# Patient Record
Sex: Female | Born: 1960
Health system: Southern US, Community
[De-identification: ages and names within clinical notes are randomized; demographics above are authoritative.]

## PROBLEM LIST (undated history)

## (undated) DIAGNOSIS — F419 Anxiety disorder, unspecified: Secondary | ICD-10-CM

## (undated) DIAGNOSIS — F329 Major depressive disorder, single episode, unspecified: Secondary | ICD-10-CM

## (undated) DIAGNOSIS — F32A Depression, unspecified: Secondary | ICD-10-CM

## (undated) DIAGNOSIS — E079 Disorder of thyroid, unspecified: Secondary | ICD-10-CM

## (undated) DIAGNOSIS — E7800 Pure hypercholesterolemia, unspecified: Secondary | ICD-10-CM

## (undated) DIAGNOSIS — R0902 Hypoxemia: Secondary | ICD-10-CM

## (undated) DIAGNOSIS — B9681 Helicobacter pylori [H. pylori] as the cause of diseases classified elsewhere: Secondary | ICD-10-CM

## (undated) DIAGNOSIS — I499 Cardiac arrhythmia, unspecified: Secondary | ICD-10-CM

## (undated) DIAGNOSIS — M810 Age-related osteoporosis without current pathological fracture: Secondary | ICD-10-CM

## (undated) DIAGNOSIS — E1140 Type 2 diabetes mellitus with diabetic neuropathy, unspecified: Secondary | ICD-10-CM

## (undated) DIAGNOSIS — G709 Myoneural disorder, unspecified: Secondary | ICD-10-CM

## (undated) DIAGNOSIS — I10 Essential (primary) hypertension: Secondary | ICD-10-CM

## (undated) DIAGNOSIS — J45909 Unspecified asthma, uncomplicated: Secondary | ICD-10-CM

## (undated) DIAGNOSIS — T7840XA Allergy, unspecified, initial encounter: Secondary | ICD-10-CM

## (undated) DIAGNOSIS — L739 Follicular disorder, unspecified: Secondary | ICD-10-CM

## (undated) DIAGNOSIS — G43909 Migraine, unspecified, not intractable, without status migrainosus: Secondary | ICD-10-CM

## (undated) DIAGNOSIS — J439 Emphysema, unspecified: Secondary | ICD-10-CM

## (undated) DIAGNOSIS — E119 Type 2 diabetes mellitus without complications: Secondary | ICD-10-CM

## (undated) DIAGNOSIS — IMO0002 Reserved for concepts with insufficient information to code with codable children: Secondary | ICD-10-CM

## (undated) DIAGNOSIS — Z8719 Personal history of other diseases of the digestive system: Secondary | ICD-10-CM

## (undated) DIAGNOSIS — N200 Calculus of kidney: Secondary | ICD-10-CM

## (undated) DIAGNOSIS — I2510 Atherosclerotic heart disease of native coronary artery without angina pectoris: Secondary | ICD-10-CM

## (undated) DIAGNOSIS — N63 Unspecified lump in unspecified breast: Secondary | ICD-10-CM

## (undated) DIAGNOSIS — I219 Acute myocardial infarction, unspecified: Secondary | ICD-10-CM

## (undated) DIAGNOSIS — M1990 Unspecified osteoarthritis, unspecified site: Secondary | ICD-10-CM

## (undated) DIAGNOSIS — K219 Gastro-esophageal reflux disease without esophagitis: Secondary | ICD-10-CM

## (undated) DIAGNOSIS — M545 Low back pain, unspecified: Secondary | ICD-10-CM

## (undated) DIAGNOSIS — E0590 Thyrotoxicosis, unspecified without thyrotoxic crisis or storm: Secondary | ICD-10-CM

## (undated) DIAGNOSIS — K2970 Gastritis, unspecified, without bleeding: Secondary | ICD-10-CM

## (undated) DIAGNOSIS — I509 Heart failure, unspecified: Secondary | ICD-10-CM

## (undated) DIAGNOSIS — R040 Epistaxis: Secondary | ICD-10-CM

## (undated) DIAGNOSIS — G4730 Sleep apnea, unspecified: Secondary | ICD-10-CM

## (undated) HISTORY — DX: Anxiety disorder, unspecified: F41.9

## (undated) HISTORY — DX: Migraine, unspecified, not intractable, without status migrainosus: G43.909

## (undated) HISTORY — DX: Essential (primary) hypertension: I10

## (undated) HISTORY — DX: Emphysema, unspecified: J43.9

## (undated) HISTORY — DX: Disorder of thyroid, unspecified: E07.9

## (undated) HISTORY — DX: Heart failure, unspecified: I50.9

## (undated) HISTORY — DX: Type 2 diabetes mellitus without complications: E11.9

## (undated) HISTORY — PX: UPPER GASTROINTESTINAL ENDOSCOPY: SHX188

## (undated) HISTORY — DX: Low back pain, unspecified: M54.50

## (undated) HISTORY — DX: Unspecified asthma, uncomplicated: J45.909

## (undated) HISTORY — PX: SPINE SURGERY: SHX786

## (undated) HISTORY — DX: Depression, unspecified: F32.A

## (undated) HISTORY — DX: Atherosclerotic heart disease of native coronary artery without angina pectoris: I25.10

## (undated) HISTORY — DX: Unspecified lump in unspecified breast: N63.0

## (undated) HISTORY — DX: Allergy, unspecified, initial encounter: T78.40XA

## (undated) HISTORY — PX: ABDOMINAL HYSTERECTOMY: SHX81

## (undated) HISTORY — PX: CARPAL TUNNEL RELEASE: SHX101

## (undated) HISTORY — DX: Hypoxemia: R09.02

## (undated) HISTORY — DX: Age-related osteoporosis without current pathological fracture: M81.0

## (undated) HISTORY — DX: Morbid (severe) obesity due to excess calories: E66.01

## (undated) HISTORY — DX: Follicular disorder, unspecified: L73.9

## (undated) HISTORY — DX: Gastritis, unspecified, without bleeding: K29.70

## (undated) HISTORY — DX: Reserved for concepts with insufficient information to code with codable children: IMO0002

## (undated) HISTORY — DX: Helicobacter pylori (H. pylori) as the cause of diseases classified elsewhere: B96.81

## (undated) HISTORY — PX: COLONOSCOPY: SHX174

## (undated) HISTORY — DX: Calculus of kidney: N20.0

## (undated) HISTORY — DX: Thyrotoxicosis, unspecified without thyrotoxic crisis or storm: E05.90

## (undated) HISTORY — DX: Type 2 diabetes mellitus with diabetic neuropathy, unspecified: E11.40

## (undated) HISTORY — DX: Major depressive disorder, single episode, unspecified: F32.9

## (undated) HISTORY — DX: Low back pain: M54.5

## (undated) HISTORY — PX: HERNIA REPAIR: SHX51

## (undated) NOTE — *Deleted (*Deleted)
Advice for Weight Management  -For most of us the best way to lose weight is by diet management. Generally speaking, diet management means consuming less calories intentionally which over time brings about progressive weight loss.  This can be achieved more effectively by restricting carbohydrate consumption to the minimum possible.  So, it is critically important to know your numbers: how much calorie you are consuming and how much calorie you need. More importantly, our carbohydrates sources should be unprocessed or minimally processed complex starch food items.   Sometimes, it is important to balance nutrition by increasing protein intake (animal or plant source), fruits, and vegetables.  -Sticking to a routine mealtime to eat 3 meals a day and avoiding unnecessary snacks is shown to have a big role in weight control. Under normal circumstances, the only time we lose real weight is when we are hungry, so allow hunger to take place- hunger means no food between meal times, only water.  It is not advisable to starve.   -It is better to avoid simple carbohydrates including: Cakes, Sweet Desserts, Ice Cream, Soda (diet and regular), Sweet Tea, Candies, Chips, Cookies, Store Bought Juices, Alcohol in Excess of  1-2 drinks a day, Artificial Sweeteners, Doughnuts, Coffee Creamers, "Sugar-free" Products, etc, etc.  This is not a complete list.....    -Consulting with certified diabetes educators is proven to provide you with the most accurate and current information on diet.  Also, you may be  interested in discussing diet options/exchanges , we can schedule a visit with Angela [name], RDN, CDE for individualized nutrition education.  -Exercise: If you are able: 30 -60 minutes a day ,4 days a week, or 150 minutes a week.  The longer the better.  Combine stretch, strength, and aerobic activities.  If you were told in the past that you have high risk for cardiovascular diseases, you may seek evaluation by  your heart doctor prior to initiating moderate to intense exercise programs.    

---
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
## 1989-05-01 HISTORY — PX: OTHER SURGICAL HISTORY: SHX169

## 1989-05-01 HISTORY — PX: PARTIAL HYSTERECTOMY: SHX80

## 1991-05-02 HISTORY — PX: BACK SURGERY: SHX140

## 1992-05-01 HISTORY — PX: BACK SURGERY: SHX140

## 1994-05-01 HISTORY — PX: CHOLECYSTECTOMY: SHX55

## 1997-05-01 HISTORY — PX: FRACTURE SURGERY: SHX138

## 1997-05-01 HISTORY — PX: OTHER SURGICAL HISTORY: SHX169

## 1998-03-10 ENCOUNTER — Emergency Department (HOSPITAL_COMMUNITY): Admission: EM | Admit: 1998-03-10 | Discharge: 1998-03-10 | Payer: Self-pay | Admitting: Emergency Medicine

## 1998-03-21 ENCOUNTER — Ambulatory Visit (HOSPITAL_COMMUNITY): Admission: RE | Admit: 1998-03-21 | Discharge: 1998-03-21 | Payer: Self-pay | Admitting: Neurosurgery

## 1998-03-21 ENCOUNTER — Encounter: Payer: Self-pay | Admitting: Neurosurgery

## 1998-03-23 ENCOUNTER — Inpatient Hospital Stay (HOSPITAL_COMMUNITY): Admission: RE | Admit: 1998-03-23 | Discharge: 1998-03-25 | Payer: Self-pay | Admitting: Neurosurgery

## 1998-03-23 ENCOUNTER — Encounter: Payer: Self-pay | Admitting: Neurosurgery

## 1998-08-16 ENCOUNTER — Encounter: Payer: Self-pay | Admitting: Emergency Medicine

## 1998-08-16 ENCOUNTER — Emergency Department (HOSPITAL_COMMUNITY): Admission: EM | Admit: 1998-08-16 | Discharge: 1998-08-16 | Payer: Self-pay | Admitting: Emergency Medicine

## 1999-03-03 ENCOUNTER — Ambulatory Visit (HOSPITAL_BASED_OUTPATIENT_CLINIC_OR_DEPARTMENT_OTHER): Admission: RE | Admit: 1999-03-03 | Discharge: 1999-03-03 | Payer: Self-pay | Admitting: Orthopedic Surgery

## 1999-03-21 ENCOUNTER — Encounter: Admission: RE | Admit: 1999-03-21 | Discharge: 1999-06-19 | Payer: Self-pay | Admitting: Orthopedic Surgery

## 1999-08-09 ENCOUNTER — Encounter: Payer: Self-pay | Admitting: Emergency Medicine

## 1999-08-09 ENCOUNTER — Emergency Department (HOSPITAL_COMMUNITY): Admission: EM | Admit: 1999-08-09 | Discharge: 1999-08-09 | Payer: Self-pay | Admitting: Emergency Medicine

## 1999-09-01 ENCOUNTER — Ambulatory Visit (HOSPITAL_COMMUNITY): Admission: RE | Admit: 1999-09-01 | Discharge: 1999-09-01 | Payer: Self-pay | Admitting: Urology

## 2000-02-19 ENCOUNTER — Ambulatory Visit (HOSPITAL_COMMUNITY): Admission: RE | Admit: 2000-02-19 | Discharge: 2000-02-19 | Payer: Self-pay | Admitting: Neurosurgery

## 2000-02-19 ENCOUNTER — Encounter: Payer: Self-pay | Admitting: Neurosurgery

## 2000-05-01 HISTORY — PX: BACK SURGERY: SHX140

## 2000-10-17 ENCOUNTER — Other Ambulatory Visit: Admission: RE | Admit: 2000-10-17 | Discharge: 2000-10-17 | Payer: Self-pay | Admitting: Obstetrics and Gynecology

## 2001-07-11 ENCOUNTER — Ambulatory Visit (HOSPITAL_COMMUNITY): Admission: RE | Admit: 2001-07-11 | Discharge: 2001-07-11 | Payer: Self-pay | Admitting: Internal Medicine

## 2001-07-25 ENCOUNTER — Encounter: Payer: Self-pay | Admitting: Family Medicine

## 2001-07-25 ENCOUNTER — Ambulatory Visit (HOSPITAL_COMMUNITY): Admission: RE | Admit: 2001-07-25 | Discharge: 2001-07-25 | Payer: Self-pay | Admitting: Family Medicine

## 2001-08-07 ENCOUNTER — Other Ambulatory Visit: Admission: RE | Admit: 2001-08-07 | Discharge: 2001-08-07 | Payer: Self-pay | Admitting: Obstetrics and Gynecology

## 2001-08-08 ENCOUNTER — Ambulatory Visit (HOSPITAL_COMMUNITY): Admission: RE | Admit: 2001-08-08 | Discharge: 2001-08-08 | Payer: Self-pay | Admitting: Obstetrics and Gynecology

## 2001-08-08 ENCOUNTER — Encounter: Payer: Self-pay | Admitting: Obstetrics and Gynecology

## 2001-08-27 ENCOUNTER — Ambulatory Visit (HOSPITAL_COMMUNITY): Admission: RE | Admit: 2001-08-27 | Discharge: 2001-08-27 | Payer: Self-pay | Admitting: Internal Medicine

## 2001-09-06 ENCOUNTER — Emergency Department (HOSPITAL_COMMUNITY): Admission: EM | Admit: 2001-09-06 | Discharge: 2001-09-06 | Payer: Self-pay | Admitting: *Deleted

## 2001-11-01 ENCOUNTER — Emergency Department (HOSPITAL_COMMUNITY): Admission: EM | Admit: 2001-11-01 | Discharge: 2001-11-01 | Payer: Self-pay | Admitting: Internal Medicine

## 2001-11-01 ENCOUNTER — Encounter: Payer: Self-pay | Admitting: *Deleted

## 2002-03-12 ENCOUNTER — Ambulatory Visit (HOSPITAL_COMMUNITY): Admission: RE | Admit: 2002-03-12 | Discharge: 2002-03-12 | Payer: Self-pay | Admitting: General Surgery

## 2002-05-23 ENCOUNTER — Encounter: Admission: RE | Admit: 2002-05-23 | Discharge: 2002-05-23 | Payer: Self-pay | Admitting: Urology

## 2002-05-23 ENCOUNTER — Encounter: Payer: Self-pay | Admitting: Urology

## 2002-06-25 ENCOUNTER — Ambulatory Visit (HOSPITAL_COMMUNITY): Admission: RE | Admit: 2002-06-25 | Discharge: 2002-06-25 | Payer: Self-pay | Admitting: Family Medicine

## 2002-06-25 ENCOUNTER — Encounter: Payer: Self-pay | Admitting: Family Medicine

## 2003-03-25 ENCOUNTER — Ambulatory Visit (HOSPITAL_COMMUNITY): Admission: RE | Admit: 2003-03-25 | Discharge: 2003-03-25 | Payer: Self-pay | Admitting: Family Medicine

## 2003-04-06 ENCOUNTER — Emergency Department (HOSPITAL_COMMUNITY): Admission: EM | Admit: 2003-04-06 | Discharge: 2003-04-06 | Payer: Self-pay | Admitting: Emergency Medicine

## 2003-11-30 ENCOUNTER — Encounter (HOSPITAL_COMMUNITY): Admission: RE | Admit: 2003-11-30 | Discharge: 2003-12-30 | Payer: Self-pay | Admitting: Family Medicine

## 2004-03-14 ENCOUNTER — Ambulatory Visit: Payer: Self-pay | Admitting: Family Medicine

## 2004-05-09 ENCOUNTER — Ambulatory Visit: Payer: Self-pay | Admitting: Family Medicine

## 2004-06-02 ENCOUNTER — Ambulatory Visit (HOSPITAL_COMMUNITY): Admission: RE | Admit: 2004-06-02 | Discharge: 2004-06-02 | Payer: Self-pay | Admitting: General Surgery

## 2004-07-07 ENCOUNTER — Ambulatory Visit: Payer: Self-pay | Admitting: Family Medicine

## 2004-08-18 ENCOUNTER — Ambulatory Visit (HOSPITAL_COMMUNITY): Admission: RE | Admit: 2004-08-18 | Discharge: 2004-08-18 | Payer: Self-pay | Admitting: Urology

## 2004-08-31 ENCOUNTER — Ambulatory Visit (HOSPITAL_COMMUNITY): Admission: RE | Admit: 2004-08-31 | Discharge: 2004-08-31 | Payer: Self-pay | Admitting: Urology

## 2004-09-27 ENCOUNTER — Ambulatory Visit: Payer: Self-pay | Admitting: Family Medicine

## 2004-10-03 ENCOUNTER — Ambulatory Visit (HOSPITAL_COMMUNITY): Admission: RE | Admit: 2004-10-03 | Discharge: 2004-10-03 | Payer: Self-pay | Admitting: *Deleted

## 2004-10-05 ENCOUNTER — Ambulatory Visit: Payer: Self-pay | Admitting: Orthopedic Surgery

## 2004-10-10 ENCOUNTER — Ambulatory Visit (HOSPITAL_COMMUNITY): Admission: RE | Admit: 2004-10-10 | Discharge: 2004-10-10 | Payer: Self-pay | Admitting: Orthopedic Surgery

## 2005-01-19 ENCOUNTER — Ambulatory Visit: Payer: Self-pay | Admitting: Family Medicine

## 2005-02-03 ENCOUNTER — Ambulatory Visit: Payer: Self-pay | Admitting: Family Medicine

## 2005-02-07 ENCOUNTER — Ambulatory Visit: Payer: Self-pay | Admitting: Family Medicine

## 2005-03-21 ENCOUNTER — Ambulatory Visit: Payer: Self-pay | Admitting: Family Medicine

## 2005-04-20 ENCOUNTER — Ambulatory Visit: Payer: Self-pay | Admitting: Family Medicine

## 2005-04-26 ENCOUNTER — Ambulatory Visit (HOSPITAL_COMMUNITY): Admission: RE | Admit: 2005-04-26 | Discharge: 2005-04-26 | Payer: Self-pay | Admitting: Family Medicine

## 2005-05-17 ENCOUNTER — Ambulatory Visit: Payer: Self-pay | Admitting: Family Medicine

## 2005-05-25 ENCOUNTER — Encounter (HOSPITAL_COMMUNITY): Admission: RE | Admit: 2005-05-25 | Discharge: 2005-06-24 | Payer: Self-pay | Admitting: Family Medicine

## 2005-08-15 ENCOUNTER — Ambulatory Visit: Payer: Self-pay | Admitting: Family Medicine

## 2005-09-26 ENCOUNTER — Ambulatory Visit: Payer: Self-pay | Admitting: Family Medicine

## 2005-11-20 ENCOUNTER — Ambulatory Visit: Payer: Self-pay | Admitting: Family Medicine

## 2005-12-11 ENCOUNTER — Ambulatory Visit: Payer: Self-pay | Admitting: Family Medicine

## 2005-12-13 ENCOUNTER — Ambulatory Visit (HOSPITAL_COMMUNITY): Admission: RE | Admit: 2005-12-13 | Discharge: 2005-12-13 | Payer: Self-pay | Admitting: Family Medicine

## 2005-12-26 ENCOUNTER — Ambulatory Visit: Payer: Self-pay | Admitting: Family Medicine

## 2006-02-15 ENCOUNTER — Ambulatory Visit: Payer: Self-pay | Admitting: Family Medicine

## 2006-02-19 ENCOUNTER — Ambulatory Visit: Payer: Self-pay | Admitting: Family Medicine

## 2006-04-05 ENCOUNTER — Ambulatory Visit: Payer: Self-pay | Admitting: Orthopedic Surgery

## 2006-04-17 ENCOUNTER — Ambulatory Visit: Payer: Self-pay | Admitting: Family Medicine

## 2006-04-21 ENCOUNTER — Emergency Department (HOSPITAL_COMMUNITY): Admission: EM | Admit: 2006-04-21 | Discharge: 2006-04-21 | Payer: Self-pay | Admitting: Emergency Medicine

## 2006-04-25 ENCOUNTER — Emergency Department (HOSPITAL_COMMUNITY): Admission: EM | Admit: 2006-04-25 | Discharge: 2006-04-25 | Payer: Self-pay | Admitting: Emergency Medicine

## 2006-04-26 ENCOUNTER — Ambulatory Visit: Payer: Self-pay | Admitting: Internal Medicine

## 2006-04-26 ENCOUNTER — Inpatient Hospital Stay (HOSPITAL_COMMUNITY): Admission: AD | Admit: 2006-04-26 | Discharge: 2006-04-29 | Payer: Self-pay

## 2006-05-01 DIAGNOSIS — B9681 Helicobacter pylori [H. pylori] as the cause of diseases classified elsewhere: Secondary | ICD-10-CM

## 2006-05-01 HISTORY — DX: Helicobacter pylori (H. pylori) as the cause of diseases classified elsewhere: B96.81

## 2006-05-01 HISTORY — PX: ESOPHAGOGASTRODUODENOSCOPY: SHX1529

## 2006-05-02 ENCOUNTER — Ambulatory Visit: Payer: Self-pay | Admitting: Family Medicine

## 2006-05-05 ENCOUNTER — Encounter: Payer: Self-pay | Admitting: Internal Medicine

## 2006-05-05 DIAGNOSIS — E1159 Type 2 diabetes mellitus with other circulatory complications: Secondary | ICD-10-CM

## 2006-05-05 DIAGNOSIS — F32A Depression, unspecified: Secondary | ICD-10-CM | POA: Insufficient documentation

## 2006-05-05 DIAGNOSIS — E119 Type 2 diabetes mellitus without complications: Secondary | ICD-10-CM | POA: Insufficient documentation

## 2006-05-05 DIAGNOSIS — M545 Low back pain: Secondary | ICD-10-CM

## 2006-05-05 DIAGNOSIS — I10 Essential (primary) hypertension: Secondary | ICD-10-CM

## 2006-05-05 DIAGNOSIS — F418 Other specified anxiety disorders: Secondary | ICD-10-CM

## 2006-05-05 DIAGNOSIS — J45909 Unspecified asthma, uncomplicated: Secondary | ICD-10-CM

## 2006-05-05 DIAGNOSIS — F419 Anxiety disorder, unspecified: Secondary | ICD-10-CM | POA: Insufficient documentation

## 2006-05-05 DIAGNOSIS — F411 Generalized anxiety disorder: Secondary | ICD-10-CM | POA: Insufficient documentation

## 2006-05-25 ENCOUNTER — Ambulatory Visit (HOSPITAL_COMMUNITY): Admission: RE | Admit: 2006-05-25 | Discharge: 2006-05-25 | Payer: Self-pay | Admitting: Family Medicine

## 2006-05-30 ENCOUNTER — Ambulatory Visit: Payer: Self-pay | Admitting: Family Medicine

## 2006-06-15 ENCOUNTER — Encounter: Payer: Self-pay | Admitting: Family Medicine

## 2006-06-15 LAB — CONVERTED CEMR LAB
ALT: 14 units/L (ref 0–35)
Alkaline Phosphatase: 61 units/L (ref 39–117)
BUN: 12 mg/dL (ref 6–23)
Basophils Relative: 0 % (ref 0–1)
Cholesterol: 166 mg/dL (ref 0–200)
Creatinine, Ser: 0.62 mg/dL (ref 0.40–1.20)
Eosinophils Absolute: 0.1 10*3/uL (ref 0.0–0.7)
Hemoglobin: 12.3 g/dL (ref 12.0–15.0)
Indirect Bilirubin: 0.3 mg/dL (ref 0.0–0.9)
LDL Cholesterol: 102 mg/dL — ABNORMAL HIGH (ref 0–99)
MCHC: 30.8 g/dL (ref 30.0–36.0)
MCV: 86 fL (ref 78.0–100.0)
Monocytes Absolute: 0.3 10*3/uL (ref 0.2–0.7)
Monocytes Relative: 6 % (ref 3–11)
RBC: 4.64 M/uL (ref 3.87–5.11)
Total Protein: 7.4 g/dL (ref 6.0–8.3)
Triglycerides: 59 mg/dL (ref <150)

## 2006-06-18 ENCOUNTER — Other Ambulatory Visit: Admission: RE | Admit: 2006-06-18 | Discharge: 2006-06-18 | Payer: Self-pay | Admitting: Family Medicine

## 2006-06-18 ENCOUNTER — Ambulatory Visit: Payer: Self-pay | Admitting: Family Medicine

## 2006-06-18 ENCOUNTER — Encounter (INDEPENDENT_AMBULATORY_CARE_PROVIDER_SITE_OTHER): Payer: Self-pay | Admitting: *Deleted

## 2006-06-18 ENCOUNTER — Encounter: Payer: Self-pay | Admitting: Family Medicine

## 2006-06-18 LAB — CONVERTED CEMR LAB: Pap Smear: NORMAL

## 2006-06-20 ENCOUNTER — Encounter: Payer: Self-pay | Admitting: Family Medicine

## 2006-06-20 ENCOUNTER — Ambulatory Visit (HOSPITAL_COMMUNITY): Admission: RE | Admit: 2006-06-20 | Discharge: 2006-06-20 | Payer: Self-pay | Admitting: Podiatry

## 2006-06-20 LAB — CONVERTED CEMR LAB
Chlamydia, DNA Probe: NEGATIVE
Gardnerella vaginalis: NEGATIVE
Trichomonal Vaginitis: POSITIVE — AB

## 2006-07-27 ENCOUNTER — Ambulatory Visit: Payer: Self-pay | Admitting: Family Medicine

## 2006-09-26 ENCOUNTER — Ambulatory Visit: Payer: Self-pay | Admitting: Family Medicine

## 2006-10-08 ENCOUNTER — Ambulatory Visit: Payer: Self-pay | Admitting: Family Medicine

## 2006-10-08 ENCOUNTER — Emergency Department (HOSPITAL_COMMUNITY): Admission: EM | Admit: 2006-10-08 | Discharge: 2006-10-08 | Payer: Self-pay | Admitting: Emergency Medicine

## 2006-10-10 ENCOUNTER — Encounter: Payer: Self-pay | Admitting: Family Medicine

## 2006-10-15 ENCOUNTER — Ambulatory Visit (HOSPITAL_COMMUNITY): Admission: RE | Admit: 2006-10-15 | Discharge: 2006-10-15 | Payer: Self-pay | Admitting: Cardiology

## 2006-11-28 ENCOUNTER — Ambulatory Visit: Payer: Self-pay | Admitting: Family Medicine

## 2006-12-20 ENCOUNTER — Inpatient Hospital Stay (HOSPITAL_BASED_OUTPATIENT_CLINIC_OR_DEPARTMENT_OTHER): Admission: RE | Admit: 2006-12-20 | Discharge: 2006-12-20 | Payer: Self-pay | Admitting: Cardiology

## 2007-01-09 ENCOUNTER — Ambulatory Visit: Payer: Self-pay | Admitting: Family Medicine

## 2007-01-16 ENCOUNTER — Ambulatory Visit: Payer: Self-pay | Admitting: Family Medicine

## 2007-02-19 ENCOUNTER — Encounter: Payer: Self-pay | Admitting: Family Medicine

## 2007-02-19 LAB — CONVERTED CEMR LAB
ALT: 17 units/L (ref 0–35)
Bilirubin, Direct: <0.1 mg/dL (ref 0.0–0.3)
Chloride: 104 meq/L (ref 96–112)
Creatinine, Ser: 0.72 mg/dL (ref 0.40–1.20)
Total Bilirubin: 0.2 mg/dL — ABNORMAL LOW (ref 0.3–1.2)

## 2007-03-13 ENCOUNTER — Emergency Department (HOSPITAL_COMMUNITY): Admission: EM | Admit: 2007-03-13 | Discharge: 2007-03-14 | Payer: Self-pay | Admitting: Emergency Medicine

## 2007-03-21 ENCOUNTER — Ambulatory Visit: Payer: Self-pay | Admitting: Family Medicine

## 2007-03-21 ENCOUNTER — Ambulatory Visit: Payer: Self-pay | Admitting: Gastroenterology

## 2007-03-21 LAB — CONVERTED CEMR LAB
Helicobacter Pylori Antibody-IgG: 2.5 — ABNORMAL HIGH
Lipase: 14 units/L (ref 0–75)

## 2007-03-22 ENCOUNTER — Ambulatory Visit (HOSPITAL_COMMUNITY): Admission: RE | Admit: 2007-03-22 | Discharge: 2007-03-22 | Payer: Self-pay | Admitting: Family Medicine

## 2007-03-25 ENCOUNTER — Ambulatory Visit (HOSPITAL_COMMUNITY): Admission: RE | Admit: 2007-03-25 | Discharge: 2007-03-25 | Payer: Self-pay | Admitting: Family Medicine

## 2007-03-27 ENCOUNTER — Ambulatory Visit: Payer: Self-pay | Admitting: Gastroenterology

## 2007-03-27 ENCOUNTER — Ambulatory Visit (HOSPITAL_COMMUNITY): Admission: RE | Admit: 2007-03-27 | Discharge: 2007-03-27 | Payer: Self-pay | Admitting: Gastroenterology

## 2007-03-27 ENCOUNTER — Encounter: Payer: Self-pay | Admitting: Gastroenterology

## 2007-04-22 ENCOUNTER — Ambulatory Visit (HOSPITAL_COMMUNITY): Admission: RE | Admit: 2007-04-22 | Discharge: 2007-04-22 | Payer: Self-pay | Admitting: Urology

## 2007-04-23 ENCOUNTER — Observation Stay (HOSPITAL_COMMUNITY): Admission: AD | Admit: 2007-04-23 | Discharge: 2007-04-24 | Payer: Self-pay | Admitting: Urology

## 2007-05-28 ENCOUNTER — Ambulatory Visit: Payer: Self-pay | Admitting: Family Medicine

## 2007-06-17 ENCOUNTER — Encounter: Payer: Self-pay | Admitting: Family Medicine

## 2007-06-17 LAB — CONVERTED CEMR LAB
ALT: 12 units/L (ref 0–35)
Albumin: 4.4 g/dL (ref 3.5–5.2)
BUN: 9 mg/dL (ref 6–23)
Chloride: 102 meq/L (ref 96–112)
HDL: 50 mg/dL (ref >39)
LDL Cholesterol: 81 mg/dL (ref 0–99)
Microalb, Ur: 1.47 mg/dL (ref 0.00–1.89)
Potassium: 4 meq/L (ref 3.5–5.3)
Sodium: 139 meq/L (ref 135–145)
Total CHOL/HDL Ratio: 2.8
Total Protein: 7.2 g/dL (ref 6.0–8.3)
Triglycerides: 51 mg/dL (ref <150)
VLDL: 10 mg/dL (ref 0–40)

## 2007-07-19 ENCOUNTER — Ambulatory Visit: Payer: Self-pay | Admitting: Family Medicine

## 2007-07-23 ENCOUNTER — Encounter (INDEPENDENT_AMBULATORY_CARE_PROVIDER_SITE_OTHER): Payer: Self-pay | Admitting: *Deleted

## 2007-09-03 ENCOUNTER — Ambulatory Visit: Payer: Self-pay | Admitting: Family Medicine

## 2007-10-23 ENCOUNTER — Ambulatory Visit: Payer: Self-pay | Admitting: Family Medicine

## 2007-11-18 ENCOUNTER — Ambulatory Visit: Payer: Self-pay | Admitting: Family Medicine

## 2007-11-21 ENCOUNTER — Ambulatory Visit (HOSPITAL_COMMUNITY): Admission: RE | Admit: 2007-11-21 | Discharge: 2007-11-21 | Payer: Self-pay | Admitting: Family Medicine

## 2007-11-27 ENCOUNTER — Encounter: Payer: Self-pay | Admitting: Family Medicine

## 2007-11-27 ENCOUNTER — Ambulatory Visit: Payer: Self-pay | Admitting: Family Medicine

## 2007-11-27 LAB — CONVERTED CEMR LAB: Glucose, Bld: 100 mg/dL

## 2007-12-03 ENCOUNTER — Telehealth: Payer: Self-pay | Admitting: Family Medicine

## 2007-12-06 ENCOUNTER — Emergency Department (HOSPITAL_COMMUNITY): Admission: EM | Admit: 2007-12-06 | Discharge: 2007-12-06 | Payer: Self-pay | Admitting: Emergency Medicine

## 2007-12-06 ENCOUNTER — Telehealth: Payer: Self-pay | Admitting: Family Medicine

## 2007-12-18 ENCOUNTER — Encounter: Payer: Self-pay | Admitting: Family Medicine

## 2007-12-18 ENCOUNTER — Telehealth: Payer: Self-pay | Admitting: Family Medicine

## 2008-01-01 ENCOUNTER — Ambulatory Visit: Payer: Self-pay | Admitting: Family Medicine

## 2008-01-01 LAB — CONVERTED CEMR LAB
Eosinophils Absolute: 0.1 10*3/uL (ref 0.0–0.7)
Eosinophils Relative: 2 % (ref 0–5)
HCT: 38.5 % (ref 36.0–46.0)
Hemoglobin: 12.5 g/dL (ref 12.0–15.0)
Lymphocytes Relative: 39 % (ref 12–46)
Lymphs Abs: 1.9 10*3/uL (ref 0.7–4.0)
MCV: 83.3 fL (ref 78.0–100.0)
Monocytes Absolute: 0.4 10*3/uL (ref 0.1–1.0)
Platelets: 446 10*3/uL — ABNORMAL HIGH (ref 150–400)
Prolactin: 11.7 ng/mL
Protein, U semiquant: NEGATIVE
RDW: 15 % (ref 11.5–15.5)
Urobilinogen, UA: 0.2
WBC Urine, dipstick: NEGATIVE

## 2008-01-06 DIAGNOSIS — N3000 Acute cystitis without hematuria: Secondary | ICD-10-CM | POA: Insufficient documentation

## 2008-01-06 DIAGNOSIS — E669 Obesity, unspecified: Secondary | ICD-10-CM | POA: Insufficient documentation

## 2008-01-21 ENCOUNTER — Encounter: Payer: Self-pay | Admitting: Family Medicine

## 2008-01-22 ENCOUNTER — Encounter: Payer: Self-pay | Admitting: Family Medicine

## 2008-02-10 ENCOUNTER — Telehealth: Payer: Self-pay | Admitting: Family Medicine

## 2008-02-13 ENCOUNTER — Ambulatory Visit: Payer: Self-pay | Admitting: Family Medicine

## 2008-02-20 ENCOUNTER — Encounter: Payer: Self-pay | Admitting: Family Medicine

## 2008-02-26 ENCOUNTER — Encounter: Payer: Self-pay | Admitting: Family Medicine

## 2008-02-26 LAB — CONVERTED CEMR LAB
Alkaline Phosphatase: 53 units/L (ref 39–117)
BUN: 12 mg/dL (ref 6–23)
Bilirubin, Direct: 0.1 mg/dL (ref 0.0–0.3)
Chloride: 104 meq/L (ref 96–112)
Creatinine, Ser: 0.77 mg/dL (ref 0.40–1.20)
Glucose, Bld: 99 mg/dL (ref 70–99)
Indirect Bilirubin: 0.4 mg/dL (ref 0.0–0.9)
LDL Cholesterol: 95 mg/dL (ref 0–99)
Potassium: 4.2 meq/L (ref 3.5–5.3)
Total Bilirubin: 0.5 mg/dL (ref 0.3–1.2)
Total Protein: 7.5 g/dL (ref 6.0–8.3)
Triglycerides: 67 mg/dL (ref <150)
VLDL: 13 mg/dL (ref 0–40)

## 2008-03-17 ENCOUNTER — Telehealth: Payer: Self-pay | Admitting: Family Medicine

## 2008-03-18 ENCOUNTER — Encounter: Payer: Self-pay | Admitting: Family Medicine

## 2008-03-19 ENCOUNTER — Encounter: Payer: Self-pay | Admitting: Family Medicine

## 2008-04-13 ENCOUNTER — Telehealth (INDEPENDENT_AMBULATORY_CARE_PROVIDER_SITE_OTHER): Payer: Self-pay | Admitting: *Deleted

## 2008-04-15 ENCOUNTER — Encounter: Payer: Self-pay | Admitting: Family Medicine

## 2008-04-17 ENCOUNTER — Encounter: Payer: Self-pay | Admitting: Family Medicine

## 2008-05-13 ENCOUNTER — Ambulatory Visit: Payer: Self-pay | Admitting: Family Medicine

## 2008-05-13 DIAGNOSIS — F4321 Adjustment disorder with depressed mood: Secondary | ICD-10-CM

## 2008-05-28 ENCOUNTER — Ambulatory Visit (HOSPITAL_COMMUNITY): Admission: RE | Admit: 2008-05-28 | Discharge: 2008-05-28 | Payer: Self-pay | Admitting: Cardiology

## 2008-06-01 ENCOUNTER — Ambulatory Visit: Payer: Self-pay | Admitting: Cardiology

## 2008-06-09 ENCOUNTER — Ambulatory Visit: Payer: Self-pay | Admitting: Cardiology

## 2008-06-10 ENCOUNTER — Telehealth: Payer: Self-pay | Admitting: Family Medicine

## 2008-06-12 ENCOUNTER — Encounter: Payer: Self-pay | Admitting: Family Medicine

## 2008-06-25 ENCOUNTER — Telehealth: Payer: Self-pay | Admitting: Family Medicine

## 2008-06-26 ENCOUNTER — Ambulatory Visit: Payer: Self-pay | Admitting: Family Medicine

## 2008-06-26 DIAGNOSIS — J209 Acute bronchitis, unspecified: Secondary | ICD-10-CM

## 2008-06-29 ENCOUNTER — Telehealth: Payer: Self-pay | Admitting: Family Medicine

## 2008-06-29 ENCOUNTER — Inpatient Hospital Stay (HOSPITAL_COMMUNITY): Admission: EM | Admit: 2008-06-29 | Discharge: 2008-07-05 | Payer: Self-pay | Admitting: Emergency Medicine

## 2008-06-30 ENCOUNTER — Telehealth: Payer: Self-pay | Admitting: Family Medicine

## 2008-07-06 ENCOUNTER — Telehealth: Payer: Self-pay | Admitting: Family Medicine

## 2008-07-09 ENCOUNTER — Ambulatory Visit: Payer: Self-pay | Admitting: Family Medicine

## 2008-07-09 LAB — CONVERTED CEMR LAB
Glucose, Bld: 137 mg/dL
Hgb A1c MFr Bld: 6.6 %

## 2008-07-20 ENCOUNTER — Telehealth: Payer: Self-pay | Admitting: Family Medicine

## 2008-08-11 ENCOUNTER — Encounter: Payer: Self-pay | Admitting: Family Medicine

## 2008-08-11 ENCOUNTER — Telehealth: Payer: Self-pay | Admitting: Family Medicine

## 2008-08-11 DIAGNOSIS — E785 Hyperlipidemia, unspecified: Secondary | ICD-10-CM | POA: Insufficient documentation

## 2008-08-11 DIAGNOSIS — E782 Mixed hyperlipidemia: Secondary | ICD-10-CM

## 2008-08-26 ENCOUNTER — Ambulatory Visit: Payer: Self-pay | Admitting: Family Medicine

## 2008-08-26 LAB — CONVERTED CEMR LAB: Glucose, Bld: 138 mg/dL

## 2008-08-27 ENCOUNTER — Encounter: Payer: Self-pay | Admitting: Family Medicine

## 2008-08-27 LAB — CONVERTED CEMR LAB
Creatinine, Urine: 170.8 mg/dL
Microalb Creat Ratio: 7 mg/g (ref 0.0–30.0)
Microalb, Ur: 1.19 mg/dL (ref 0.00–1.89)

## 2008-08-30 DIAGNOSIS — N771 Vaginitis, vulvitis and vulvovaginitis in diseases classified elsewhere: Secondary | ICD-10-CM

## 2008-08-31 LAB — CONVERTED CEMR LAB: Gardnerella vaginalis: POSITIVE — AB

## 2008-09-02 ENCOUNTER — Encounter: Payer: Self-pay | Admitting: Family Medicine

## 2008-09-02 ENCOUNTER — Ambulatory Visit (HOSPITAL_COMMUNITY): Admission: RE | Admit: 2008-09-02 | Discharge: 2008-09-02 | Payer: Self-pay | Admitting: Family Medicine

## 2008-09-02 LAB — CONVERTED CEMR LAB
ALT: 13 units/L (ref 0–35)
Albumin: 4.4 g/dL (ref 3.5–5.2)
BUN: 10 mg/dL (ref 6–23)
Chloride: 103 meq/L (ref 96–112)
Cholesterol: 155 mg/dL (ref 0–200)
Creatinine, Urine: 207.3 mg/dL
HDL: 49 mg/dL (ref >39)
Indirect Bilirubin: 0.4 mg/dL (ref 0.0–0.9)
LDL Cholesterol: 93 mg/dL (ref 0–99)
Microalb Creat Ratio: 8.1 mg/g (ref 0.0–30.0)
Microalb, Ur: 1.68 mg/dL (ref 0.00–1.89)
Potassium: 4 meq/L (ref 3.5–5.3)
Sodium: 139 meq/L (ref 135–145)
Total Protein: 7.3 g/dL (ref 6.0–8.3)
Triglycerides: 63 mg/dL (ref <150)
VLDL: 13 mg/dL (ref 0–40)

## 2008-09-08 ENCOUNTER — Encounter: Payer: Self-pay | Admitting: Family Medicine

## 2008-09-09 ENCOUNTER — Encounter: Payer: Self-pay | Admitting: Family Medicine

## 2008-09-10 ENCOUNTER — Ambulatory Visit (HOSPITAL_COMMUNITY): Admission: RE | Admit: 2008-09-10 | Discharge: 2008-09-10 | Payer: Self-pay | Admitting: Family Medicine

## 2008-10-05 ENCOUNTER — Telehealth: Payer: Self-pay | Admitting: Family Medicine

## 2008-10-05 ENCOUNTER — Encounter: Payer: Self-pay | Admitting: Family Medicine

## 2008-10-29 ENCOUNTER — Encounter: Payer: Self-pay | Admitting: Family Medicine

## 2008-11-04 ENCOUNTER — Encounter: Payer: Self-pay | Admitting: Family Medicine

## 2008-11-06 ENCOUNTER — Encounter: Payer: Self-pay | Admitting: Family Medicine

## 2008-11-12 ENCOUNTER — Telehealth: Payer: Self-pay | Admitting: Family Medicine

## 2008-11-25 ENCOUNTER — Ambulatory Visit: Payer: Self-pay | Admitting: Family Medicine

## 2008-11-25 DIAGNOSIS — M25519 Pain in unspecified shoulder: Secondary | ICD-10-CM | POA: Insufficient documentation

## 2008-11-25 LAB — CONVERTED CEMR LAB: OCCULT 1: POSITIVE

## 2008-11-26 ENCOUNTER — Encounter: Payer: Self-pay | Admitting: Family Medicine

## 2008-11-30 ENCOUNTER — Telehealth: Payer: Self-pay | Admitting: Family Medicine

## 2008-12-01 ENCOUNTER — Encounter: Payer: Self-pay | Admitting: Family Medicine

## 2008-12-08 ENCOUNTER — Encounter: Payer: Self-pay | Admitting: Family Medicine

## 2008-12-17 ENCOUNTER — Telehealth (INDEPENDENT_AMBULATORY_CARE_PROVIDER_SITE_OTHER): Payer: Self-pay | Admitting: *Deleted

## 2008-12-23 ENCOUNTER — Ambulatory Visit: Payer: Self-pay | Admitting: Family Medicine

## 2008-12-25 ENCOUNTER — Encounter: Payer: Self-pay | Admitting: Family Medicine

## 2009-01-14 ENCOUNTER — Emergency Department (HOSPITAL_COMMUNITY): Admission: EM | Admit: 2009-01-14 | Discharge: 2009-01-14 | Payer: Self-pay | Admitting: Emergency Medicine

## 2009-01-19 ENCOUNTER — Encounter: Payer: Self-pay | Admitting: Family Medicine

## 2009-01-19 ENCOUNTER — Telehealth: Payer: Self-pay | Admitting: Family Medicine

## 2009-01-27 ENCOUNTER — Encounter: Payer: Self-pay | Admitting: Family Medicine

## 2009-02-15 ENCOUNTER — Ambulatory Visit: Payer: Self-pay | Admitting: Family Medicine

## 2009-02-15 LAB — CONVERTED CEMR LAB
Glucose, Bld: 103 mg/dL
Hgb A1c MFr Bld: 6.2 %

## 2009-02-17 ENCOUNTER — Telehealth: Payer: Self-pay | Admitting: Family Medicine

## 2009-02-17 ENCOUNTER — Encounter: Admission: RE | Admit: 2009-02-17 | Discharge: 2009-02-17 | Payer: Self-pay | Admitting: Neurosurgery

## 2009-03-16 ENCOUNTER — Encounter: Payer: Self-pay | Admitting: Family Medicine

## 2009-03-17 ENCOUNTER — Encounter: Payer: Self-pay | Admitting: Family Medicine

## 2009-03-22 ENCOUNTER — Telehealth: Payer: Self-pay | Admitting: Family Medicine

## 2009-04-13 ENCOUNTER — Encounter: Payer: Self-pay | Admitting: Family Medicine

## 2009-04-27 ENCOUNTER — Ambulatory Visit: Payer: Self-pay | Admitting: Family Medicine

## 2009-04-27 ENCOUNTER — Telehealth: Payer: Self-pay | Admitting: Family Medicine

## 2009-04-27 DIAGNOSIS — J111 Influenza due to unidentified influenza virus with other respiratory manifestations: Secondary | ICD-10-CM | POA: Insufficient documentation

## 2009-05-01 HISTORY — PX: BACK SURGERY: SHX140

## 2009-05-14 ENCOUNTER — Emergency Department (HOSPITAL_COMMUNITY): Admission: EM | Admit: 2009-05-14 | Discharge: 2009-05-14 | Payer: Self-pay | Admitting: Emergency Medicine

## 2009-05-17 ENCOUNTER — Encounter: Payer: Self-pay | Admitting: Family Medicine

## 2009-05-18 ENCOUNTER — Ambulatory Visit: Payer: Self-pay | Admitting: Orthopedic Surgery

## 2009-05-18 DIAGNOSIS — M7530 Calcific tendinitis of unspecified shoulder: Secondary | ICD-10-CM

## 2009-05-19 ENCOUNTER — Encounter: Payer: Self-pay | Admitting: Orthopedic Surgery

## 2009-05-24 ENCOUNTER — Telehealth: Payer: Self-pay | Admitting: Family Medicine

## 2009-06-10 ENCOUNTER — Encounter: Payer: Self-pay | Admitting: Orthopedic Surgery

## 2009-06-15 ENCOUNTER — Encounter: Payer: Self-pay | Admitting: Family Medicine

## 2009-06-21 ENCOUNTER — Ambulatory Visit: Payer: Self-pay | Admitting: Family Medicine

## 2009-06-21 LAB — CONVERTED CEMR LAB: Glucose, Bld: 115 mg/dL

## 2009-06-24 ENCOUNTER — Telehealth: Payer: Self-pay | Admitting: Family Medicine

## 2009-06-25 ENCOUNTER — Telehealth: Payer: Self-pay | Admitting: Family Medicine

## 2009-06-28 ENCOUNTER — Telehealth: Payer: Self-pay | Admitting: Family Medicine

## 2009-07-13 ENCOUNTER — Telehealth: Payer: Self-pay | Admitting: Family Medicine

## 2009-07-13 ENCOUNTER — Encounter: Payer: Self-pay | Admitting: Family Medicine

## 2009-07-22 ENCOUNTER — Telehealth: Payer: Self-pay | Admitting: Family Medicine

## 2009-08-12 ENCOUNTER — Telehealth: Payer: Self-pay | Admitting: Family Medicine

## 2009-08-16 ENCOUNTER — Encounter: Payer: Self-pay | Admitting: Family Medicine

## 2009-09-13 ENCOUNTER — Encounter: Payer: Self-pay | Admitting: Family Medicine

## 2009-09-14 ENCOUNTER — Ambulatory Visit (HOSPITAL_COMMUNITY): Admission: RE | Admit: 2009-09-14 | Discharge: 2009-09-14 | Payer: Self-pay | Admitting: Family Medicine

## 2009-10-12 ENCOUNTER — Encounter: Payer: Self-pay | Admitting: Family Medicine

## 2009-10-20 ENCOUNTER — Ambulatory Visit: Payer: Self-pay | Admitting: Family Medicine

## 2009-10-20 DIAGNOSIS — R5383 Other fatigue: Secondary | ICD-10-CM

## 2009-10-20 DIAGNOSIS — E559 Vitamin D deficiency, unspecified: Secondary | ICD-10-CM | POA: Insufficient documentation

## 2009-10-20 DIAGNOSIS — R5381 Other malaise: Secondary | ICD-10-CM

## 2009-10-25 LAB — CONVERTED CEMR LAB
ALT: 17 units/L (ref 0–35)
AST: 19 units/L (ref 0–37)
Albumin: 4.6 g/dL (ref 3.5–5.2)
Alkaline Phosphatase: 59 units/L (ref 39–117)
BUN: 9 mg/dL (ref 6–23)
Basophils Absolute: 0 10*3/uL (ref 0.0–0.1)
Basophils Relative: 0 % (ref 0–1)
Bilirubin, Direct: 0.1 mg/dL (ref 0.0–0.3)
CO2: 24 meq/L (ref 19–32)
Calcium: 9.3 mg/dL (ref 8.4–10.5)
Chloride: 103 meq/L (ref 96–112)
Cholesterol: 186 mg/dL (ref 0–200)
Creatinine, Ser: 0.56 mg/dL (ref 0.40–1.20)
Eosinophils Absolute: 0.1 10*3/uL (ref 0.0–0.7)
Eosinophils Relative: 2 % (ref 0–5)
Glucose, Bld: 92 mg/dL (ref 70–99)
HCT: 38.4 % (ref 36.0–46.0)
HDL: 48 mg/dL (ref >39)
Hemoglobin: 12.3 g/dL (ref 12.0–15.0)
Hgb A1c MFr Bld: 6.5 % — ABNORMAL HIGH (ref <5.7)
Indirect Bilirubin: 0.4 mg/dL (ref 0.0–0.9)
LDL Cholesterol: 119 mg/dL — ABNORMAL HIGH (ref 0–99)
Lymphocytes Relative: 39 % (ref 12–46)
Lymphs Abs: 2.1 10*3/uL (ref 0.7–4.0)
MCHC: 32 g/dL (ref 30.0–36.0)
MCV: 83.7 fL (ref 78.0–100.0)
Monocytes Absolute: 0.4 10*3/uL (ref 0.1–1.0)
Monocytes Relative: 8 % (ref 3–12)
Neutro Abs: 2.7 10*3/uL (ref 1.7–7.7)
Neutrophils Relative %: 51 % (ref 43–77)
Platelets: 474 10*3/uL — ABNORMAL HIGH (ref 150–400)
Potassium: 4 meq/L (ref 3.5–5.3)
RBC: 4.59 M/uL (ref 3.87–5.11)
RDW: 14.5 % (ref 11.5–15.5)
Sodium: 140 meq/L (ref 135–145)
TSH: 1.193 microintl units/mL (ref 0.350–4.500)
Total Bilirubin: 0.5 mg/dL (ref 0.3–1.2)
Total CHOL/HDL Ratio: 3.9
Total Protein: 7.7 g/dL (ref 6.0–8.3)
Triglycerides: 97 mg/dL (ref <150)
VLDL: 19 mg/dL (ref 0–40)
Vit D, 25-Hydroxy: 16 ng/mL — ABNORMAL LOW (ref 30–89)
WBC: 5.4 10*3/uL (ref 4.0–10.5)

## 2009-11-03 ENCOUNTER — Ambulatory Visit: Payer: Self-pay | Admitting: Family Medicine

## 2009-11-03 DIAGNOSIS — R0602 Shortness of breath: Secondary | ICD-10-CM | POA: Insufficient documentation

## 2009-11-03 DIAGNOSIS — R609 Edema, unspecified: Secondary | ICD-10-CM | POA: Insufficient documentation

## 2009-11-11 ENCOUNTER — Encounter: Payer: Self-pay | Admitting: Family Medicine

## 2009-12-08 ENCOUNTER — Encounter: Payer: Self-pay | Admitting: Family Medicine

## 2010-01-04 ENCOUNTER — Ambulatory Visit: Payer: Self-pay | Admitting: Family Medicine

## 2010-01-05 ENCOUNTER — Encounter: Payer: Self-pay | Admitting: Family Medicine

## 2010-01-06 ENCOUNTER — Ambulatory Visit: Payer: Self-pay | Admitting: Family Medicine

## 2010-01-06 DIAGNOSIS — L259 Unspecified contact dermatitis, unspecified cause: Secondary | ICD-10-CM | POA: Insufficient documentation

## 2010-01-13 ENCOUNTER — Telehealth (INDEPENDENT_AMBULATORY_CARE_PROVIDER_SITE_OTHER): Payer: Self-pay | Admitting: *Deleted

## 2010-02-02 ENCOUNTER — Ambulatory Visit (HOSPITAL_COMMUNITY): Admission: RE | Admit: 2010-02-02 | Discharge: 2010-02-02 | Payer: Self-pay | Admitting: Orthopedic Surgery

## 2010-02-10 ENCOUNTER — Encounter: Payer: Self-pay | Admitting: Family Medicine

## 2010-02-17 ENCOUNTER — Telehealth: Payer: Self-pay | Admitting: Family Medicine

## 2010-03-01 ENCOUNTER — Telehealth: Payer: Self-pay | Admitting: Family Medicine

## 2010-03-01 ENCOUNTER — Encounter: Payer: Self-pay | Admitting: Family Medicine

## 2010-03-02 ENCOUNTER — Ambulatory Visit: Payer: Self-pay | Admitting: Family Medicine

## 2010-03-02 ENCOUNTER — Telehealth: Payer: Self-pay | Admitting: Family Medicine

## 2010-03-07 ENCOUNTER — Encounter: Payer: Self-pay | Admitting: Family Medicine

## 2010-03-10 ENCOUNTER — Encounter: Payer: Self-pay | Admitting: Family Medicine

## 2010-03-31 ENCOUNTER — Ambulatory Visit: Payer: Self-pay | Admitting: Family Medicine

## 2010-03-31 DIAGNOSIS — M546 Pain in thoracic spine: Secondary | ICD-10-CM

## 2010-04-05 ENCOUNTER — Encounter: Payer: Self-pay | Admitting: Family Medicine

## 2010-04-06 ENCOUNTER — Emergency Department (HOSPITAL_COMMUNITY)
Admission: EM | Admit: 2010-04-06 | Discharge: 2010-04-06 | Payer: Self-pay | Source: Home / Self Care | Admitting: Emergency Medicine

## 2010-05-03 ENCOUNTER — Encounter: Payer: Self-pay | Admitting: Family Medicine

## 2010-05-03 ENCOUNTER — Telehealth: Payer: Self-pay | Admitting: Family Medicine

## 2010-05-09 ENCOUNTER — Ambulatory Visit
Admission: RE | Admit: 2010-05-09 | Discharge: 2010-05-09 | Payer: Self-pay | Source: Home / Self Care | Attending: Family Medicine | Admitting: Family Medicine

## 2010-05-09 ENCOUNTER — Encounter: Payer: Self-pay | Admitting: Family Medicine

## 2010-05-09 ENCOUNTER — Ambulatory Visit: Admission: RE | Admit: 2010-05-09 | Payer: Self-pay | Source: Home / Self Care | Admitting: Family Medicine

## 2010-05-09 DIAGNOSIS — J019 Acute sinusitis, unspecified: Secondary | ICD-10-CM | POA: Insufficient documentation

## 2010-05-09 DIAGNOSIS — M25529 Pain in unspecified elbow: Secondary | ICD-10-CM | POA: Insufficient documentation

## 2010-05-09 LAB — CONVERTED CEMR LAB
Alkaline Phosphatase: 71 units/L (ref 39–117)
Glucose, Bld: 100 mg/dL — ABNORMAL HIGH (ref 70–99)
LDL Cholesterol: 98 mg/dL (ref 0–99)
Sodium: 141 meq/L (ref 135–145)
Total Bilirubin: 0.4 mg/dL (ref 0.3–1.2)
Total CHOL/HDL Ratio: 3.5
Total Protein: 8 g/dL (ref 6.0–8.3)
Triglycerides: 92 mg/dL (ref <150)
VLDL: 18 mg/dL (ref 0–40)

## 2010-05-21 ENCOUNTER — Encounter: Payer: Self-pay | Admitting: Cardiology

## 2010-05-22 ENCOUNTER — Encounter: Payer: Self-pay | Admitting: Family Medicine

## 2010-05-23 ENCOUNTER — Encounter: Payer: Self-pay | Admitting: Family Medicine

## 2010-05-24 ENCOUNTER — Telehealth: Payer: Self-pay | Admitting: Family Medicine

## 2010-05-24 ENCOUNTER — Encounter: Payer: Self-pay | Admitting: Family Medicine

## 2010-05-25 ENCOUNTER — Ambulatory Visit
Admission: RE | Admit: 2010-05-25 | Discharge: 2010-05-25 | Payer: Self-pay | Source: Home / Self Care | Attending: Family Medicine | Admitting: Family Medicine

## 2010-05-26 ENCOUNTER — Encounter: Payer: Self-pay | Admitting: Family Medicine

## 2010-06-02 NOTE — Assessment & Plan Note (Signed)
Summary: office visit   Vital Signs:  Patient profile:   50 year old female Menstrual status:  hysterectomy Height:      65 inches Weight:      295.25 pounds BMI:     49.31 O2 Sat:      97 % on Room air Pulse rate:   87 / minute Pulse rhythm:   regular Resp:     16 per minute BP sitting:   118 / 80  (left arm)  Vitals Entered By: Adella Hare LPN (January 06, 2010 2:36 PM)  Nutrition Counseling: Patient's BMI is greater than 25 and therefore counseled on weight management options.  O2 Flow:  Room air CC: rash above left ankle Is Patient Diabetic? Yes Did you bring your meter with you today? No Comments did not bring meds to ov   CC:  rash above left ankle.  History of Present Illness: Pt's main concern is a rash on the legs which has been present but she "forgot to mention" at her ecent visit.  Allergies (verified): 1)  ! Asa 2)  ! Morphine  Review of Systems      See HPI General:  Complains of fatigue. Eyes:  Denies discharge. ENT:  Denies hoarseness, nosebleeds, postnasal drainage, and sinus pressure. CV:  Denies chest pain or discomfort, difficulty breathing while lying down, and palpitations. Resp:  Denies cough and sputum productive. GI:  Denies abdominal pain, constipation, diarrhea, nausea, and vomiting. GU:  Denies dysuria and urinary frequency. MS:  Complains of joint pain, low back pain, mid back pain, and stiffness. Derm:  Complains of itching and rash; puritic red warm tender  rash which has been spreadingin the past 3 weeks. Primarily on the lower legs, std on the right, lgst on the left now , no feevr or chills, no drainage , first episode. Endo:  Denies excessive thirst and heat intolerance. Heme:  Denies abnormal bruising and bleeding. Allergy:  Complains of seasonal allergies.  Physical Exam  General:  Well-developed,obese,in no acute distress; alert,appropriate and cooperative throughout examination HEENT: No facial asymmetry,  EOMI, No sinus  tenderness, TM's Clear, oropharynx  pink and moist.   Chest: Clear to auscultation bilaterally.  CVS: S1, S2, No murmurs, No S3.   Abd: Soft, Nontender.  ZO:[id number]  ROM spine, hips, shoulders and knees.  Ext: 1 to 2plus ptting  edema.   CNS: CN 2-12 intact, power tone and sensation normal throughout.   Skin: Intact, no erythematous macular rashes on both lower extremeties, distal aspect/.  Psych: Good eye contact, normal affect.  Memory fair, not  depressed appearing.    Impression & Recommendations:  Problem # 1:  DERMATITIS (ICD-692.9) Assessment Comment Only  Her updated medication list for this problem includes:    Prednisone (pak) 5 Mg Tabs (Prednisone) ..... Use as directed  Problem # 2:  OBESITY (ICD-278.00) Assessment: Unchanged  Ht: 65 (01/06/2010)   Wt: 295.25 (01/06/2010)   BMI: 49.31 (01/06/2010) therapeutic lifestyle change discussed and encouraged  Problem # 3:  HYPERTENSION (ICD-401.9) Assessment: Improved  Her updated medication list for this problem includes:    Diovan Hct 80-12.5 Mg Tabs (Valsartan-hydrochlorothiazide) ..... Once daily  BP today: 118/80 Prior BP: 138/70 (11/03/2009)  Labs Reviewed: K+: 4.0 (10/20/2009) Creat: : 0.56 (10/20/2009)   Chol: 186 (10/20/2009)   HDL: 48 (10/20/2009)   LDL: 119 (10/20/2009)   TG: 97 (10/20/2009)  Complete Medication List: 1)  Diovan Hct 80-12.5 Mg Tabs (Valsartan-hydrochlorothiazide) .... Once daily 2)  Gabapentin  800 Mg Tabs (Gabapentin) .... One tab by mouth qid 3)  Metformin Hcl 500 Mg Tabs (Metformin hcl) .... Two tabs by mouth every morning and one tab by mouth every evening 4)  Cyclobenzaprine Hcl 10 Mg Tabs (Cyclobenzaprine hcl) .... One tab by mouth three times a day prn 5)  Ambien 10 Mg Tabs (Zolpidem tartrate) .... Take 1 tab by mouth at bedtime 6)  Symbicort 160-4.5 Mcg/act Aero (Budesonide-formoterol fumarate) .... 2 puffs twice daily 7)  Singulair 10 Mg Tabs (Montelukast sodium) .... Take 1  tablet by mouth once a day 8)  Oxycontin 40 Mg Xr12h-tab (Oxycodone hcl) .... Take 1 tablet by mouth three times a day 9)  Klor-con M20 20 Meq Cr-tabs (Potassium chloride crys cr) .... One tab by mouth qd 10)  Flonase 50 Mcg/act Susp (Fluticasone propionate) .... 2 puffs per nostril daily 11)  Nexium 40 Mg Cpdr (Esomeprazole magnesium) .... Take 1 capsule by mouth once a day 12)  Proair Hfa 108 (90 Base) Mcg/act Aers (Albuterol sulfate) .... Two puffs by mouth every 6- 8 hours prn 13)  Diazepam 5 Mg Tabs (Diazepam) .... One tab by mouth three times a day 14)  Proventil Hfa 108 (90 Base) Mcg/act Aers (Albuterol sulfate) .... Two puffs by mouth every 6 to 8 hours as needed 15)  Fluoxetine Hcl 20 Mg Caps (Fluoxetine hcl) .... Take 1 capsule by mouth once a day 16)  Vitamin D (ergocalciferol) 50000 Unit Caps (Ergocalciferol) .... One capsule once weekly 17)  Lipitor 20 Mg Tabs (Atorvastatin calcium) .... Take 1 tab by mouth at bedtime 18)  Keflex 500 Mg Caps (Cephalexin) .... Take 1 capsule by mouth two times a day 19)  Prednisone (pak) 5 Mg Tabs (Prednisone) .... Use as directed 20)  Fluconazole 150 Mg Tabs (Fluconazole) .... Take 1 tablet by mouth once a day as needed  Patient Instructions: 1)  F/U as before. 2)  You are being treated for dermatitis. 3)  Plsmcall early next week if no better or worse for a referral  to dermatology. Prescriptions: FLUCONAZOLE 150 MG TABS (FLUCONAZOLE) Take 1 tablet by mouth once a day as needed  #3 x 0   Entered and Authorized by:   Syliva Overman MD   Signed by:   Syliva Overman MD on 01/06/2010   Method used:   Electronically to        [facility] [address] [phone]* (retail)       [address], [place]  [address]       Ph: [phone]       Fax: [phone]   RxID:   [id number] PREDNISONE (PAK) 5 MG TABS (PREDNISONE) Use as directed  #21 x 0   Entered and Authorized by:   Syliva Overman MD   Signed by:   Syliva Overman MD on  01/06/2010   Method used:   Electronically to        [facility] [address] [phone]* (retail)       [address] [place], [place]  [address]       Ph: [phone]       Fax: [phone]   RxID:   [id number] KEFLEX 500 MG CAPS (CEPHALEXIN) Take 1 capsule by mouth two times a day  #20 x 0   Entered and Authorized by:   Syliva Overman MD   Signed by:   Syliva Overman MD on 01/06/2010   Method  used:   Electronically to        [facility]. [phone]* (retail)       [address], [place]  [address]       Ph: [phone]       Fax: [phone]   RxID:   [id number]

## 2010-06-02 NOTE — Assessment & Plan Note (Signed)
Summary: F UP   Vital Signs:  Patient profile:   50 year old female Menstrual status:  hysterectomy Height:      65 inches Weight:      296 pounds BMI:     49.44 O2 Sat:      98 % Pulse rate:   87 / minute Pulse rhythm:   regular Resp:     16 per minute BP sitting:   140 / 80  (left arm) Cuff size:   xl   Vitals Entered By: Everitt Amber LPN (January 04, 2010 1:46 PM)  Nutrition Counseling: Patient's BMI is greater than 25 and therefore counseled on weight management options. CC: Follow up chronic problems   CC:  Follow up chronic problems.  History of Present Illness: Reports  that tshe has been doing fairly well. Her main concern is weight gain, she is thinking about joining a water aerobics class at the [employer] Denies recent fever or chills. Denies sinus pressure, nasal congestion , ear pain or sore throat. Denies chest congestion, or cough productive of sputum. Denies chest pain, palpitations, PND, orthopnea or leg swelling. Denies abdominal pain, nausea, vomitting, diarrhea or constipation. Denies change in bowel movements or bloody stool. Denies dysuria , frequency, incontinence or hesitancy.  Denies headaches, vertigo, seizures. Denies depression, anxiety or insomnia.controlled on meds, and improved since last visit. Denies  rash, lesions, or itch.     Allergies (verified): 1)  ! Asa 2)  ! Morphine  Review of Systems      See HPI General:  Complains of fatigue and malaise. Eyes:  Denies discharge and red eye. MS:  Complains of joint pain, low back pain, mid back pain, and stiffness. Endo:  Denies excessive thirst and excessive urination. Heme:  Denies abnormal bruising and bleeding. Allergy:  Complains of seasonal allergies; denies hives or rash and itching eyes.  Physical Exam  General:  Well-developed,obese,in no acute distress; alert,appropriate and cooperative throughout examination HEENT: No facial asymmetry,  EOMI, No sinus tenderness, TM's Clear,  oropharynx  pink and moist.   Chest: Clear to auscultation bilaterally.  CVS: S1, S2, No murmurs, No S3.   Abd: Soft, Nontender.  EA:[id number]  ROM spine, hips, shoulders and knees.  Ext: no  edema.   CNS: CN 2-12 intact, power tone and sensation normal throughout.   Skin: Intact, no visible lesions or rashes.  Psych: Good eye contact, normal affect.  Memory fair, not  depressed appearing.    Impression & Recommendations:  Problem # 1:  OBESITY (ICD-278.00) Assessment Deteriorated  Ht: 65 (01/04/2010)   Wt: 296 (01/04/2010)   BMI: 49.44 (01/04/2010) therapeutic lifestyle change discussed and encouraged  Problem # 2:  LOW BACK PAIN (ICD-724.2) Assessment: Deteriorated  Her updated medication list for this problem includes:    Cyclobenzaprine Hcl 10 Mg Tabs (Cyclobenzaprine hcl) ..... One tab by mouth three times a day prn    Oxycontin 40 Mg Xr12h-tab (Oxycodone hcl) .Marland Kitchen... Take 1 tablet by mouth three times a day  Problem # 3:  HYPERTENSION (ICD-401.9) Assessment: Unchanged  Her updated medication list for this problem includes:    Diovan Hct 80-12.5 Mg Tabs (Valsartan-hydrochlorothiazide) ..... Once daily  Orders: T-Basic Metabolic Panel [phone])  BP today: 140/80 Prior BP: 138/70 (11/03/2009)  Labs Reviewed: K+: 4.0 (10/20/2009) Creat: : 0.56 (10/20/2009)   Chol: 186 (10/20/2009)   HDL: 48 (10/20/2009)   LDL: 119 (10/20/2009)   TG: 97 (10/20/2009)  Problem # 4:  DIABETES MELLITUS, TYPE II (ICD-250.00)  Assessment: Comment Only  Her updated medication list for this problem includes:    Diovan Hct 80-12.5 Mg Tabs (Valsartan-hydrochlorothiazide) ..... Once daily    Metformin Hcl 500 Mg Tabs (Metformin hcl) .Marland Kitchen..Marland Kitchen Two tabs by mouth every morning and one tab by mouth every evening  Orders: T-Urine Microalbumin w/creat. ratio [phone]) T- Hemoglobin A1C [phone])  Labs Reviewed: Creat: 0.56 (10/20/2009)    Reviewed HgBA1c results: 6.5 (10/20/2009)   6.5 (06/21/2009)  Problem # 5:  DEPRESSION (ICD-311) Assessment: Improved  Her updated medication list for this problem includes:    Diazepam 5 Mg Tabs (Diazepam) ..... One tab by mouth three times a day    Fluoxetine Hcl 20 Mg Caps (Fluoxetine hcl) .Marland Kitchen... Take 1 capsule by mouth once a day  Problem # 6:  LEG EDEMA (ICD-782.3) Assessment: Improved  Her updated medication list for this problem includes:    Diovan Hct 80-12.5 Mg Tabs (Valsartan-hydrochlorothiazide) ..... Once daily  Complete Medication List: 1)  Diovan Hct 80-12.5 Mg Tabs (Valsartan-hydrochlorothiazide) .... Once daily 2)  Gabapentin 800 Mg Tabs (Gabapentin) .... One tab by mouth qid 3)  Metformin Hcl 500 Mg Tabs (Metformin hcl) .... Two tabs by mouth every morning and one tab by mouth every evening 4)  Cyclobenzaprine Hcl 10 Mg Tabs (Cyclobenzaprine hcl) .... One tab by mouth three times a day prn 5)  Ambien 10 Mg Tabs (Zolpidem tartrate) .... Take 1 tab by mouth at bedtime 6)  Symbicort 160-4.5 Mcg/act Aero (Budesonide-formoterol fumarate) .... 2 puffs twice daily 7)  Singulair 10 Mg Tabs (Montelukast sodium) .... Take 1 tablet by mouth once a day 8)  Oxycontin 40 Mg Xr12h-tab (Oxycodone hcl) .... Take 1 tablet by mouth three times a day 9)  Klor-con M20 20 Meq Cr-tabs (Potassium chloride crys cr) .... One tab by mouth qd 10)  Flonase 50 Mcg/act Susp (Fluticasone propionate) .... 2 puffs per nostril daily 11)  Nexium 40 Mg Cpdr (Esomeprazole magnesium) .... Take 1 capsule by mouth once a day 12)  Proair Hfa 108 (90 Base) Mcg/act Aers (Albuterol sulfate) .... Two puffs by mouth every 6- 8 hours prn 13)  Diazepam 5 Mg Tabs (Diazepam) .... One tab by mouth three times a day 14)  Proventil Hfa 108 (90 Base) Mcg/act Aers (Albuterol sulfate) .... Two puffs by mouth every 6 to 8 hours as needed 15)  Fluoxetine Hcl 20 Mg Caps (Fluoxetine hcl) .... Take 1 capsule by mouth once a day 16)  Vitamin D (ergocalciferol) 50000 Unit  Caps (Ergocalciferol) .... One capsule once weekly 17)  Lipitor 20 Mg Tabs (Atorvastatin calcium) .... Take 1 tab by mouth at bedtime 18)  Keflex 500 Mg Caps (Cephalexin) .... Take 1 capsule by mouth two times a day 19)  Prednisone (pak) 5 Mg Tabs (Prednisone) .... Use as directed 20)  Fluconazole 150 Mg Tabs (Fluconazole) .... Take 1 tablet by mouth once a day as needed  Other Orders: T-Hepatic Function [phone]) T-Lipid Profile ([id number])  Patient Instructions: 1)  Please schedule a follow-up appointment in 2 months. 2)  It is important that you exercise regularly at least 20 minutes 5 times a week. If you develop chest pain, have severe difficulty breathing, or feel very tired , stop exercising immediately and seek medical attention. 3)  You need to lose weight. Consider a lower calorie diet and regular exercise.  4)  BMP prior to visit, ICD-9: 5)  Hepatic Panel prior to visit, ICD-9: 6)  Lipid Panel prior to visit, ICD-9:  fasting in 2 months 7)  HbgA1C prior to visit, ICD-9: Prescriptions: OXYCONTIN 40 MG XR12H-TAB (OXYCODONE HCL) Take 1 tablet by mouth three times a day  #90 x 0   Entered by:   Everitt Amber LPN   Authorized by:   Syliva Overman MD   Signed by:   Everitt Amber LPN on 54/12/8117   Method used:   Printed then faxed to ...       [facility] [address] [phone]* (retail)       [address] [place]  [address]       Ph: [phone]       Fax: [phone]   RxID:   [id number]

## 2010-06-02 NOTE — Medication Information (Signed)
Summary: Tax adviser   Imported By: Lind Guest 03/10/2010 09:27:09  _____________________________________________________________________  External Attachment:    Type:   Image     Comment:   External Document

## 2010-06-02 NOTE — Assessment & Plan Note (Signed)
Summary: INJECTION  Nurse Visit   Vital Signs:  Patient profile:   50 year old female Menstrual status:  hysterectomy Weight:      291.50 pounds Comments back pain   Allergies: 1)  ! Asa 2)  ! Morphine  Medication Administration  Injection # 1:    Medication: Depo- Medrol 80mg     Diagnosis: LOW BACK PAIN (ICD-724.2)    Route: IM    Site: RUOQ gluteus    Exp Date: 07/12    Lot #: Gunnar Bulla    Mfr: Pharmacia    Patient tolerated injection without complications    Given by: Adella Hare LPN (March 02, 2010 4:11 PM)  Injection # 2:    Medication: Ketorolac-Toradol 15mg     Diagnosis: LOW BACK PAIN (ICD-724.2)    Route: IM    Site: LUOQ gluteus    Exp Date: 03/02/2011    Lot #: [id number]    Mfr: [email or web address]    Comments: toradol 60mg  given    Patient tolerated injection without complications    Given by: Adella Hare LPN (March 02, 2010 4:12 PM)  Orders Added: 1)  Depo- Medrol 80mg  [J1040] 2)  Ketorolac-Toradol 15mg  [J1885] 3)  Admin of Therapeutic Inj  intramuscular or subcutaneous [[id number]]   Medication Administration  Injection # 1:    Medication: Depo- Medrol 80mg     Diagnosis: LOW BACK PAIN (ICD-724.2)    Route: IM    Site: RUOQ gluteus    Exp Date: 07/12    Lot #: Gunnar Bulla    Mfr: Pharmacia    Patient tolerated injection without complications    Given by: Adella Hare LPN (March 02, 2010 4:11 PM)  Injection # 2:    Medication: Ketorolac-Toradol 15mg     Diagnosis: LOW BACK PAIN (ICD-724.2)    Route: IM    Site: LUOQ gluteus    Exp Date: 03/02/2011    Lot #: [id number]    Mfr: [email or web address]    Comments: toradol 60mg  given    Patient tolerated injection without complications    Given by: Adella Hare LPN (March 02, 2010 4:12 PM)  Orders Added: 1)  Depo- Medrol 80mg  [J1040] 2)  Ketorolac-Toradol 15mg  [J1885] 3)  Admin of Therapeutic Inj  intramuscular or subcutaneous [96372]  Injections administered with no adverse reaction

## 2010-06-02 NOTE — Letter (Signed)
Summary: Letter  Letter   Imported By: Lind Guest 05/26/2010 16:02:57  _____________________________________________________________________  External Attachment:    Type:   Image     Comment:   External Document

## 2010-06-02 NOTE — Miscellaneous (Signed)
Summary: NARC REFILL  Clinical Lists Changes  Medications: Rx of OXYCONTIN 40 MG XR12H-TAB (OXYCODONE HCL) Take 1 tablet by mouth three times a day;  #90 x 0;  Signed;  Entered by: Everitt Amber LPN;  Authorized by: Syliva Overman MD;  Method used: Handwritten    Prescriptions: OXYCONTIN 40 MG XR12H-TAB (OXYCODONE HCL) Take 1 tablet by mouth three times a day  #90 x 0   Entered by:   Everitt Amber LPN   Authorized by:   Syliva Overman MD   Signed by:   Everitt Amber LPN on 16/01/9603   Method used:   Handwritten   RxID:   [id number]

## 2010-06-02 NOTE — Progress Notes (Signed)
Summary: Initial evaluation  Initial evaluation   Imported By: Jacklynn Ganong 05/17/2009 14:49:45  _____________________________________________________________________  External Attachment:    Type:   Image     Comment:   External Document

## 2010-06-02 NOTE — Letter (Signed)
Summary: NEB MACHINE  NEB MACHINE   Imported By: Lind Guest 03/01/2010 13:57:25  _____________________________________________________________________  External Attachment:    Type:   Image     Comment:   External Document

## 2010-06-02 NOTE — Miscellaneous (Signed)
Summary: NARC REFILL  Clinical Lists Changes  Medications: Rx of OXYCONTIN 40 MG XR12H-TAB (OXYCODONE HCL) Take 1 tablet by mouth three times a day;  #90 x 0;  Signed;  Entered by: Everitt Amber LPN;  Authorized by: Syliva Overman MD;  Method used: Printed then faxed to [facility] [address] [phone]*, [address], [place], [place]  [address], Ph: [phone], Fax: [phone]    Prescriptions: OXYCONTIN 40 MG XR12H-TAB (OXYCODONE HCL) Take 1 tablet by mouth three times a day  #90 x 0   Entered by:   Everitt Amber LPN   Authorized by:   Syliva Overman MD   Signed by:   Everitt Amber LPN on 52/84/1324   Method used:   Printed then faxed to ...       [facility] [address] [phone]* (retail)       [address] [place]  [address]       Ph: [phone]       Fax: [phone]   RxID:   [id number]

## 2010-06-02 NOTE — Progress Notes (Signed)
Summary: SURGERY/ PAIN  Phone Note Call from Patient   Summary of Call: CHANGED HER MIND AND NOT GOING TO HAVE THE SURGERY GOING TO TRY TO DEAL WITH IT A LITTLE LONGER PLEASE CALL HER BACK AT 552.0003 ABOUT SOMETHING FOR PAIN Initial call taken by: Lind Guest,  June 25, 2009 9:49 AM  Follow-up for Phone Call        pls refer pt to Dr Eduard Clos to evaluate and treat chronic and uncontrolled back pain due top severe disc disease, pt aware of the plan to refer Follow-up by: Syliva Overman MD,  June 25, 2009 5:19 PM  Additional Follow-up for Phone Call Additional follow up Details #1::        sent everything over to dr.bethea office. And they will review notes and call pt with appt and time. pt notified  Additional Follow-up by: Rudene Anda,  June 28, 2009 9:26 AM

## 2010-06-02 NOTE — Progress Notes (Signed)
  Phone Note From Pharmacy   Caller: [facility] [address] [phone]* Summary of Call: pharmacy requesting change from Ventolin AER HFA to [facility] Initial call taken by: Worthy Keeler LPN,  May 24, 2009 3:49 PM  Follow-up for Phone Call        ok, give same directions, and notify pt and  pharmacy  Follow-up by: Syliva Overman MD,  May 24, 2009 5:52 PM  Additional Follow-up for Phone Call Additional follow up Details #1::        Prescription resent Additional Follow-up by: Worthy Keeler LPN,  May 25, 2009 9:33 AM    New/Updated Medications: PROAIR HFA 108 (90 BASE) MCG/ACT AERS (ALBUTEROL SULFATE) two puffs by mouth every 6- 8 hours prn Prescriptions: PROAIR HFA 108 (90 BASE) MCG/ACT AERS (ALBUTEROL SULFATE) two puffs by mouth every 6- 8 hours prn  #1 x 4   Entered by:   Worthy Keeler LPN   Authorized by:   Syliva Overman MD   Signed by:   Worthy Keeler LPN on 47/82/9562   Method used:   Electronically to        [facility] [address] [phone]* (retail)       [address], [place]  [address]       Ph: [phone]       Fax: [phone]   RxID:   [id number]

## 2010-06-02 NOTE — Letter (Signed)
Summary: medicine approved  medicine approved   Imported By: Lind Guest 05/24/2010 10:57:05  _____________________________________________________________________  External Attachment:    Type:   Image     Comment:   External Document

## 2010-06-02 NOTE — Progress Notes (Signed)
Summary: injection  Phone Note Call from Patient   Summary of Call: wants to know can she have a shot for her elbow when the dr. comes back in on Monday Initial call taken by: Lind Guest,  May 03, 2010 4:31 PM  Follow-up for Phone Call        ok to give toradol 60mg  im for elbow pain, need to document which elbow and duration of pain, pls let her know and arrange nurse visit Follow-up by: Syliva Overman MD,  May 09, 2010 12:52 PM  Additional Follow-up for Phone Call Additional follow up Details #1::        called patient, left message Additional Follow-up by: Adella Hare LPN,  May 09, 2010 1:36 PM    Additional Follow-up for Phone Call Additional follow up Details #2::    patient in office today Follow-up by: Adella Hare LPN,  May 09, 2010 2:21 PM

## 2010-06-02 NOTE — Miscellaneous (Signed)
Summary: NARC REFILL  Clinical Lists Changes  Medications: Rx of OXYCONTIN 40 MG XR12H-TAB (OXYCODONE HCL) Take 1 tablet by mouth three times a day;  #90 x 0;  Signed;  Entered by: Everitt Amber;  Authorized by: Syliva Overman MD;  Method used: Handwritten    Prescriptions: OXYCONTIN 40 MG XR12H-TAB (OXYCODONE HCL) Take 1 tablet by mouth three times a day  #90 x 0   Entered by:   Everitt Amber   Authorized by:   Syliva Overman MD   Signed by:   Everitt Amber on 06/15/2009   Method used:   Handwritten   RxID:   [id number]

## 2010-06-02 NOTE — Assessment & Plan Note (Signed)
Summary: office visit   Vital Signs:  Patient profile:   50 year old female Menstrual status:  hysterectomy Height:      65 inches Weight:      287 pounds BMI:     47.93 O2 Sat:      97 % on Room air Pulse rate:   78 / minute Pulse rhythm:   regular Resp:     16 per minute BP sitting:   130 / 80  (left arm)  Vitals Entered By: Adella Hare LPN (March 31, 2010 3:34 PM)  Nutrition Counseling: Patient's BMI is greater than 25 and therefore counseled on weight management options.  O2 Flow:  Room air CC: follow-up visit Is Patient Diabetic? Yes Comments did not bring meds to ov but states med list is correct   CC:  follow-up visit.  History of Present Illness: new thoracic pain since Nov , no real relief from current meds she is on, but no interest in imaging or further evaluation of this at this time, she had back surgery earlier this year. Reports  that she has been doing fairly well , all things considered. She has upcoming knee surgery. Denies recent fever or chills. Denies sinus pressure, nasal congestion , ear pain or sore throat. Denies chest congestion, or cough productive of sputum. Denies chest pain, PND, orthopnea or leg swelling. Denies abdominal pain, nausea, vomitting, diarrhea or constipation. Denies change in bowel movements or bloody stool. Denies dysuria , frequency, incontinence or hesitancy.  Denies headaches, vertigo, seizures. Denies depression, anxiety or insomnia. Denies  rash, lesions, or itch.     pt states she had her knee surgery was postoned because of palpitations, Dr Shana Chute is checking on her  Allergies: 1)  ! Asa 2)  ! Morphine  Review of Systems      See HPI General:  Complains of fatigue. Eyes:  Denies blurring, discharge, eye pain, and red eye. CV:  Complains of palpitations; cardiology evaluating. MS:  Complains of joint pain, low back pain, muscle aches, and stiffness. Psych:  Complains of anxiety and depression; denies  mental problems, suicidal thoughts/plans, thoughts of violence, and unusual visions or sounds; controlled on meds. Endo:  Denies cold intolerance, excessive hunger, excessive thirst, and excessive urination. Heme:  Denies abnormal bruising and bleeding. Allergy:  Denies hives or rash, itching eyes, persistent infections, and seasonal allergies.  Physical Exam  General:  Well-developed,obese,in no acute distress; alert,appropriate and cooperative throughout examination HEENT: No facial asymmetry,  EOMI, No sinus tenderness, TM's Clear, oropharynx  pink and moist.   Chest: Clear to auscultation bilaterally.  CVS: S1, S2, No murmurs, No S3.   Abd: Soft, Nontender.  UJ:[id number]  ROM spine, hips, shoulders and knees.  Ext: no edema CNS: CN 2-12 intact, power tone and sensation normal throughout.   Skin: Intact, no erythematous macular rashes on both lower extremeties, distal aspect/.  Psych: Good eye contact, normal affect.  Memory fair, not  depressed appearing.   Diabetes Management Exam:    Eye Exam:       Eye Exam done elsewhere          Date: 12/08/2009          Results: normal          Done by: dr Nile Riggs   Impression & Recommendations:  Problem # 1:  BACK PAIN, THORACIC REGION (ICD-724.1) Assessment Deteriorated  Her updated medication list for this problem includes:    Cyclobenzaprine Hcl 10 Mg Tabs (Cyclobenzaprine  hcl) ..... One tab by mouth three times a day prn    Oxycontin 40 Mg Xr12h-tab (Oxycodone hcl) .Marland Kitchen... Take 1 tablet by mouth three times a day  Orders: Radiology other (Radiology Other) Ketorolac-Toradol 15mg  (Z6109) Admin of Therapeutic Inj  intramuscular or subcutaneous (60454)  Problem # 2:  HYPERLIPIDEMIA (ICD-272.4) Assessment: Comment Only  Her updated medication list for this problem includes:    Lipitor 20 Mg Tabs (Atorvastatin calcium) .Marland Kitchen... Take 1 tab by mouth at bedtime Low fat dietdiscussed and encouraged  Orders: T-Lipid Profile  [id number])  Labs Reviewed: SGOT: 19 (10/20/2009)   SGPT: 17 (10/20/2009)   HDL:48 (10/20/2009), 49 (09/02/2008)  LDL:119 (10/20/2009), 93 (29/56/2130)  Chol:186 (10/20/2009), 155 (09/02/2008)  Trig:97 (10/20/2009), 63 (09/02/2008)  Problem # 3:  DIABETES MELLITUS, TYPE II (ICD-250.00) Assessment: Comment Only  Her updated medication list for this problem includes:    Diovan Hct 80-12.5 Mg Tabs (Valsartan-hydrochlorothiazide) ..... Once daily    Metformin Hcl 500 Mg Tabs (Metformin hcl) .Marland Kitchen..Marland Kitchen Two tabs by mouth every morning and one tab by mouth every evening Patient advised to reduce carbs and sweets, commit to regular physical activity, take meds as prescribed, test blood sugars as directed, and attempt to lose weight , to improve blood sugar control.  Orders: T- Hemoglobin A1C ([id number])  Labs Reviewed: Creat: 0.56 (10/20/2009)     Last Eye Exam: normal (12/08/2009) Reviewed HgBA1c results: 6.5 (10/20/2009)  6.5 (06/21/2009)  Problem # 4:  ANXIETY (ICD-300.00) Assessment: Improved  Her updated medication list for this problem includes:    Diazepam 5 Mg Tabs (Diazepam) ..... One tab by mouth three times a day    Fluoxetine Hcl 20 Mg Caps (Fluoxetine hcl) .Marland Kitchen... Take 1 capsule by mouth once a day  Complete Medication List: 1)  Diovan Hct 80-12.5 Mg Tabs (Valsartan-hydrochlorothiazide) .... Once daily 2)  Gabapentin 800 Mg Tabs (Gabapentin) .... One tab by mouth qid 3)  Metformin Hcl 500 Mg Tabs (Metformin hcl) .... Two tabs by mouth every morning and one tab by mouth every evening 4)  Cyclobenzaprine Hcl 10 Mg Tabs (Cyclobenzaprine hcl) .... One tab by mouth three times a day prn 5)  Ambien 10 Mg Tabs (Zolpidem tartrate) .... Take 1 tab by mouth at bedtime 6)  Symbicort 160-4.5 Mcg/act Aero (Budesonide-formoterol fumarate) .... 2 puffs twice daily 7)  Singulair 10 Mg Tabs (Montelukast sodium) .... Take 1 tablet by mouth once a day 8)  Oxycontin 40 Mg Xr12h-tab (Oxycodone  hcl) .... Take 1 tablet by mouth three times a day 9)  Klor-con M20 20 Meq Cr-tabs (Potassium chloride crys cr) .... One tab by mouth qd 10)  Flonase 50 Mcg/act Susp (Fluticasone propionate) .... 2 puffs per nostril daily 11)  Nexium 40 Mg Cpdr (Esomeprazole magnesium) .... Take 1 capsule by mouth once a day 12)  Proair Hfa 108 (90 Base) Mcg/act Aers (Albuterol sulfate) .... Two puffs by mouth every 6- 8 hours prn 13)  Diazepam 5 Mg Tabs (Diazepam) .... One tab by mouth three times a day 14)  Proventil Hfa 108 (90 Base) Mcg/act Aers (Albuterol sulfate) .... Two puffs by mouth every 6 to 8 hours as needed 15)  Fluoxetine Hcl 20 Mg Caps (Fluoxetine hcl) .... Take 1 capsule by mouth once a day 16)  Vitamin D (ergocalciferol) 50000 Unit Caps (Ergocalciferol) .... One capsule once weekly 17)  Lipitor 20 Mg Tabs (Atorvastatin calcium) .... Take 1 tab by mouth at bedtime 18)  Nitrolingual 0.4 Mg/spray Soln (Nitroglycerin) .... Use  as directed  Other Orders: Medicare Electronic Prescription [phone]) T-CMP with estimated GFR ([address])  Patient Instructions: 1)  Please schedule a follow-up appointment in 3 months. 2)  It is important that you exercise regularly at least 20 minutes 5 times a week. If you develop chest pain, have severe difficulty breathing, or feel very tired , stop exercising immediately and seek medical attention. 3)  You need to lose weight. Consider a lower calorie diet and regular exercise.  4)  CMP, 5)  Lipid Panel prior to visit, ICD-9:  fasting asap 6)  HbgA1C prior to visit, ICD-9:, vit D 7)  Toradol 60mg  today, you will need an xray of your spine.   Medication Administration  Injection # 1:    Medication: Ketorolac-Toradol 15mg     Diagnosis: BACK PAIN, THORACIC REGION (ICD-724.1)    Route: IM    Site: LUOQ gluteus    Exp Date: 03/02/2011    Lot #: [id number]    Mfr: novaplus    Comments: toradol 60mg  given    Patient tolerated injection without complications     Given by: Adella Hare LPN (March 31, 2010 5:09 PM)  Orders Added: 1)  Radiology other [Radiology Other] 2)  Est. Patient Level IV [95621] 3)  Medicare Electronic Prescription [G8553] 4)  T-CMP with estimated GFR [[address]] 5)  T-Lipid Profile [80061-22930] 6)  T- Hemoglobin A1C [83036-23375] 7)  Ketorolac-Toradol 15mg  [J1885] 8)  Admin of Therapeutic Inj  intramuscular or subcutaneous [96372]     Medication Administration  Injection # 1:    Medication: Ketorolac-Toradol 15mg     Diagnosis: BACK PAIN, THORACIC REGION (ICD-724.1)    Route: IM    Site: LUOQ gluteus    Exp Date: 03/02/2011    Lot #: [id number]    Mfr: novaplus    Comments: toradol 60mg  given    Patient tolerated injection without complications    Given by: Adella Hare LPN (March 31, 2010 5:09 PM)  Orders Added: 1)  Radiology other [Radiology Other] 2)  Est. Patient Level IV [46962] 3)  Medicare Electronic Prescription [G8553] 4)  T-CMP with estimated GFR [[address]] 5)  T-Lipid Profile [80061-22930] 6)  T- Hemoglobin A1C [83036-23375] 7)  Ketorolac-Toradol 15mg  [J1885] 8)  Admin of Therapeutic Inj  intramuscular or subcutaneous [95284]

## 2010-06-02 NOTE — Progress Notes (Signed)
Summary: eye exam  eye exam   Imported By: Lind Guest 12/09/2009 11:08:48  _____________________________________________________________________  External Attachment:    Type:   Image     Comment:   External Document

## 2010-06-02 NOTE — Progress Notes (Signed)
Summary: CALL  Phone Note Call from Patient   Summary of Call: Angela [name] AT [phone] Initial call taken by: Lind Guest,  July 13, 2009 2:01 PM  Follow-up for Phone Call        spoke to patient. It was regarding her daughter having to wait on the narc rx. It was because we had to make sure she wasn't getting it from [facility] Follow-up by: Everitt Amber LPN,  July 14, 2009 9:04 AM

## 2010-06-02 NOTE — Assessment & Plan Note (Signed)
Summary: office visit   Vital Signs:  Patient profile:   50 year old female Menstrual status:  hysterectomy Height:      65 inches Weight:      268 pounds BMI:     44.76 O2 Sat:      95 % Pulse rate:   83 / minute Pulse rhythm:   regular Resp:     16 per minute BP sitting:   148 / 82  (right arm) Cuff size:   large  Vitals Entered By: Everitt Amber LPN (June 21, 2009 1:05 PM)  Nutrition Counseling: Patient's BMI is greater than 25 and therefore counseled on weight management options. CC: Follow up chronic problems Is Patient Diabetic? Yes   CC:  Follow up chronic problems.  History of Present Illness: Reports  thatshe has not been doing well. She has uncontrolled back pain, nd has been told that her options are essentially none as far as surgery for this i concerned. The decision has been taken to have the least invasive procedure that couls possibly help, and she is scheduled for this next week. She has alot of anxiety over the procedure, however, the quality of her life currently mandates that she do somethting about it, and she had a long discussio with the surgeon about the propsed procedure. Denies recent fever or chills. Denies sinus pressure, nasal congestion , ear pain or sore throat. Denies chest congestion, or cough productive of sputum. Denies chest pain, palpitations, PND, orthopnea or leg swelling. Denies abdominal pain, nausea, vomitting, diarrhea or constipation. Denies change in bowel movements or bloody stool. Denies dysuria , frequency, incontinence or hesitancy.  Denies headaches, vertigo, seizures.  Denies  rash, lesions, or itch.     Current Medications (verified): 1)  Lipitor 10 Mg Tabs (Atorvastatin Calcium) .... At Bedtime 2)  Diovan Hct 80-12.5 Mg Tabs (Valsartan-Hydrochlorothiazide) .... Once Daily 3)  Diazepam 5 Mg/ml Soln (Diazepam) .... One Tab Po  Three Times A Day 4)  Gabapentin 800 Mg  Tabs (Gabapentin) .... One Tab By Mouth Qid 5)   Metformin Hcl 500 Mg Tabs (Metformin Hcl) .... Two Tabs By Mouth Every Morning and One Tab By Mouth Every Evening 6)  Cyclobenzaprine Hcl 10 Mg Tabs (Cyclobenzaprine Hcl) .... One Tab By Mouth Three Times A Day Prn 7)  Ambien 10 Mg Tabs (Zolpidem Tartrate) .... Take 1 Tab By Mouth At Bedtime 8)  Symbicort 160-4.5 Mcg/act Aero (Budesonide-Formoterol Fumarate) .... 2 Puffs Twice Daily 9)  Singulair 10 Mg Tabs (Montelukast Sodium) .... Take 1 Tablet By Mouth Once A Day 10)  Oxycontin 40 Mg Xr12h-Tab (Oxycodone Hcl) .... Take 1 Tablet By Mouth Three Times A Day 11)  Prozac 20 Mg Caps (Fluoxetine Hcl) .... One Cap By Mouth Qd 12)  Klor-Con M20 20 Meq Cr-Tabs (Potassium Chloride Crys Cr) .... One Tab By Mouth Qd 13)  Flonase 50 Mcg/act Susp (Fluticasone Propionate) .... 2 Puffs Per Nostril Daily 14)  Nexium 40 Mg Cpdr (Esomeprazole Magnesium) .... Take 1 Capsule By Mouth Once A Day 15)  Fluconazole 150 Mg Tabs (Fluconazole) .... One Tab By Mouth 16)  Tamiflu 75 Mg Caps (Oseltamivir Phosphate) .... Take 1 Capsule By Mouth Two Times A Day 17)  Penicillin V Potassium 500 Mg Tabs (Penicillin V Potassium) .... Take 1 Tablet By Mouth Three Times A Day 18)  Tessalon Perles 100 Mg Caps (Benzonatate) .... Take 1 Capsule By Mouth Three Times A Day 19)  Fluconazole 150 Mg Tabs (Fluconazole) .... Take 1  Tablet By Mouth Once A Day As Needed 20)  Proair Hfa 108 (90 Base) Mcg/act Aers (Albuterol Sulfate) .... Two Puffs By Mouth Every 6- 8 Hours Prn  Allergies (verified): 1)  ! Asa 2)  ! Morphine  Review of Systems      See HPI Eyes:  Denies blurring. MS:  Complains of low back pain, mid back pain, and stiffness; pt has uncontrolled back and loweer ext pain, both feet feel cold. Psych:  Complains of anxiety, depression, and easily tearful; denies suicidal thoughts/plans, thoughts of violence, and unusual visions or sounds; pt has extreme anxiety an some depression of her current uncontrolled and debilitating back  pain. Endo:  Denies cold intolerance, excessive hunger, excessive thirst, excessive urination, heat intolerance, polyuria, and weight change. Heme:  Denies abnormal bruising and bleeding. Allergy:  Denies hives or rash and itching eyes.  Physical Exam  General:  Well-developed,obese, ; alert,appropriate and cooperative throughout examination HEENT: No facial asymmetry,  EOMI, no  sinus tenderness, TM's clear, oropharynx  pink and moist. Neck supple, with bilateral ant cervical adenitis  Chest: decreased air entry with bilateral crackles and wheezes CVS: S1, S2, No murmurs, No S3.   Abd: Soft, Nontender.  UV:[id number] ROM spine,adequate in  hips, shoulders and knees.  Ext: No edema.   CNS: CN 2-12 intact, power tone and sensation normal throughout.   Skin: Intact, no visible lesions or rashes.  Psych: Good eye contact, normal affect.  Memory intact, both  anxious and depressed appearing.    Impression & Recommendations:  Problem # 1:  HYPERLIPIDEMIA (ICD-272.4) Assessment Comment Only  Her updated medication list for this problem includes:    Lipitor 10 Mg Tabs (Atorvastatin calcium) .Marland Kitchen... At bedtime  Labs Reviewed: SGOT: 14 (09/02/2008)   SGPT: 13 (09/02/2008)   HDL:49 (09/02/2008), 43 (02/26/2008)  LDL:93 (09/02/2008), 95 (02/26/2008)  Chol:155 (09/02/2008), 151 (02/26/2008)  Trig:63 (09/02/2008), 67 (02/26/2008)  Problem # 2:  OBESITY (ICD-278.00) Assessment: Deteriorated  Ht: 65 (06/21/2009)   Wt: 268 (06/21/2009)   BMI: 44.76 (06/21/2009)  Problem # 3:  LOW BACK PAIN (ICD-724.2) Assessment: Deteriorated  Her updated medication list for this problem includes:    Cyclobenzaprine Hcl 10 Mg Tabs (Cyclobenzaprine hcl) ..... One tab by mouth three times a day prn    Oxycontin 40 Mg Xr12h-tab (Oxycodone hcl) .Marland Kitchen... Take 1 tablet by mouth three times a day  Problem # 4:  DEPRESSION (ICD-311) Assessment: Deteriorated  Her updated medication list for this problem includes:     Diazepam 5 Mg/ml Soln (Diazepam) ..... One tab po  three times a day    Prozac 20 Mg Caps (Fluoxetine hcl) ..... One cap by mouth qd  Problem # 5:  DIABETES MELLITUS, TYPE II (ICD-250.00) Assessment: Unchanged  Her updated medication list for this problem includes:    Diovan Hct 80-12.5 Mg Tabs (Valsartan-hydrochlorothiazide) ..... Once daily    Metformin Hcl 500 Mg Tabs (Metformin hcl) .Marland Kitchen..Marland Kitchen Two tabs by mouth every morning and one tab by mouth every evening  Orders: Glucose, (CBG) (82962) Hemoglobin A1C (83036)  Labs Reviewed: Creat: 0.64 (09/02/2008)    Reviewed HgBA1c results: 6.5 (06/21/2009)  6.2 (02/15/2009)  Complete Medication List: 1)  Lipitor 10 Mg Tabs (Atorvastatin calcium) .... At bedtime 2)  Diovan Hct 80-12.5 Mg Tabs (Valsartan-hydrochlorothiazide) .... Once daily 3)  Diazepam 5 Mg/ml Soln (Diazepam) .... One tab po  three times a day 4)  Gabapentin 800 Mg Tabs (Gabapentin) .... One tab by mouth qid 5)  Metformin  Hcl 500 Mg Tabs (Metformin hcl) .... Two tabs by mouth every morning and one tab by mouth every evening 6)  Cyclobenzaprine Hcl 10 Mg Tabs (Cyclobenzaprine hcl) .... One tab by mouth three times a day prn 7)  Ambien 10 Mg Tabs (Zolpidem tartrate) .... Take 1 tab by mouth at bedtime 8)  Symbicort 160-4.5 Mcg/act Aero (Budesonide-formoterol fumarate) .... 2 puffs twice daily 9)  Singulair 10 Mg Tabs (Montelukast sodium) .... Take 1 tablet by mouth once a day 10)  Oxycontin 40 Mg Xr12h-tab (Oxycodone hcl) .... Take 1 tablet by mouth three times a day 11)  Prozac 20 Mg Caps (Fluoxetine hcl) .... One cap by mouth qd 12)  Klor-con M20 20 Meq Cr-tabs (Potassium chloride crys cr) .... One tab by mouth qd 13)  Flonase 50 Mcg/act Susp (Fluticasone propionate) .... 2 puffs per nostril daily 14)  Nexium 40 Mg Cpdr (Esomeprazole magnesium) .... Take 1 capsule by mouth once a day 15)  Fluconazole 150 Mg Tabs (Fluconazole) .... One tab by mouth 16)  Tamiflu 75 Mg Caps  (Oseltamivir phosphate) .... Take 1 capsule by mouth two times a day 17)  Penicillin V Potassium 500 Mg Tabs (Penicillin v potassium) .... Take 1 tablet by mouth three times a day 18)  Tessalon Perles 100 Mg Caps (Benzonatate) .... Take 1 capsule by mouth three times a day 19)  Fluconazole 150 Mg Tabs (Fluconazole) .... Take 1 tablet by mouth once a day as needed 20)  Proair Hfa 108 (90 Base) Mcg/act Aers (Albuterol sulfate) .... Two puffs by mouth every 6- 8 hours prn  Patient Instructions: 1)  Please schedule a follow-up appointment in 3 months. 2)  You need to lose weight. Consider a lower calorie diet and regular exercise.  3)  You may use tylenol 50mg  one four times daily as needed for pain, continue the oxycontin. 4)  All the best for your surgery  Laboratory Results   Blood Tests   Date/Time Received: June 21, 2009  Date/Time Reported: June 21, 2009   Glucose (random): 115 mg/dL   (Normal Range: 16-109) HGBA1C: 6.5%   (Normal Range: Non-Diabetic - 3-6%   Control Diabetic - 6-8%)

## 2010-06-02 NOTE — Letter (Signed)
Summary: neb. machine  neb. machine   Imported By: Lind Guest 03/15/2010 11:57:55  _____________________________________________________________________  External Attachment:    Type:   Image     Comment:   External Document

## 2010-06-02 NOTE — Progress Notes (Signed)
Summary: breathing machine  Phone Note Call from Patient   Summary of Call: needs a new bretathing machine [facility] to [facility]  [facility]  call back at [phone] Initial call taken by: Lind Guest,  March 01, 2010 8:59 AM  Follow-up for Phone Call        rx written, pls fax and let her know (in box up front) Follow-up by: Syliva Overman MD,  March 01, 2010 12:26 PM  Additional Follow-up for Phone Call Additional follow up Details #1::        LEFT MESSAGE Additional Follow-up by: Lind Guest,  March 01, 2010 1:10 PM

## 2010-06-02 NOTE — Progress Notes (Signed)
Summary: fluid  Phone Note Call from Patient   Summary of Call: went to see dr. Etta Quill and he said that she has fluid on her body and she needs something [facility] in [place]  she needs fluid off her having surgery wednesday call her back at [phone]  Initial call taken by: Lind Guest,  February 17, 2010 4:17 PM  Follow-up for Phone Call        Pls advise pt Dr spruil her cardiologist is capable of prescribinng med to get the fluid off, he saw her most recently, she needs to have him preswcribe the med siince in his judgement she needs it Follow-up by: Syliva Overman MD,  February 18, 2010 3:41 PM  Additional Follow-up for Phone Call Additional follow up Details #1::        called patient, no answer Additional Follow-up by: Adella Hare LPN,  February 18, 2010 4:45 PM    Additional Follow-up for Phone Call Additional follow up Details #2::    called patient, no answer Follow-up by: Adella Hare LPN,  February 22, 2010 9:37 AM

## 2010-06-02 NOTE — Progress Notes (Signed)
Summary: surgery  Phone Note Call from Patient   Summary of Call: pt is having surgery now! Just wanted doc to know Initial call taken by: Rudene Anda,  July 13, 2009 3:43 PM  Follow-up for Phone Call        noted Follow-up by: Syliva Overman MD,  July 13, 2009 5:28 PM

## 2010-06-02 NOTE — Progress Notes (Signed)
Summary: Progress note  Progress note   Imported By: Jacklynn Ganong 05/17/2009 14:50:27  _____________________________________________________________________  External Attachment:    Type:   Image     Comment:   External Document

## 2010-06-02 NOTE — Progress Notes (Signed)
  Phone Note Call from Patient   Summary of Call: When she was at [facility] they changed her rx to Opana 40mg  tid. They took her off oxycontin because it was making her sick because [facility] changed from the tab to the caps and it was making her throw up. She wants to know what you want her to be on before she comes up here to get her script. She doesn't want to get the oxycontin if she can't tolerate them. Wants to see if you'll give her Opana  [phone] Initial call taken by: Everitt Amber LPN,  August 12, 2009 4:32 PM  Follow-up for Phone Call        let her know I will write the opana, after verifying the dose, pls send for d/c summary from [facility], o/rand verify withthe pharmacy she has filled the opana at Follow-up by: Syliva Overman MD,  August 13, 2009 6:12 AM  Additional Follow-up for Phone Call Additional follow up Details #1::        called patient, no answer Additional Follow-up by: Everitt Amber LPN,  August 13, 2009 8:30 AM    Additional Follow-up for Phone Call Additional follow up Details #2::    patient aware Follow-up by: Adella Hare LPN,  August 13, 2009 4:00 PM   Appended Document:  [facility] doctor not changing meds, but sending vicodin over weekend

## 2010-06-02 NOTE — Medication Information (Signed)
Summary: Tax adviser   Imported By: Lind Guest 04/05/2010 17:06:11  _____________________________________________________________________  External Attachment:    Type:   Image     Comment:   External Document

## 2010-06-02 NOTE — Assessment & Plan Note (Signed)
Summary: F UP   Vital Signs:  Patient profile:   50 year old female Menstrual status:  hysterectomy Height:      65 inches Weight:      279.25 pounds BMI:     46.64 O2 Sat:      93 % Pulse rate:   87 / minute Pulse rhythm:   regular Resp:     16 per minute BP sitting:   124 / 82  (left arm) Cuff size:   xl  Vitals Entered By: Veleta Miners  Nutrition Counseling: Patient's BMI is greater than 25 and therefore counseled on weight management options. CC: Follow up chronic problems   CC:  Follow up chronic problems.  History of Present Illness: Reports  that she is not doing very well. She has become increasingly depressed since her surgery, when she heard that she would eventually become paralyzed, and that there was really not much hope for her.She has become increasingly withdrawn, and keeps herself in a daze, sh also holds on to her faith .She is also vewry concerned about her only daughter who she states is showing incresing signs of alcohol dependence. Denies recent fever or chills. Denies sinus pressure, nasal congestion , ear pain or sore throat. Denies chest congestion, or cough productive of sputum. Denies chest pain, palpitations, PND, orthopnea or leg swelling. Denies abdominal pain, nausea, vomitting, diarrhea or constipation. Denies change in bowel movements or bloody stool. Denies dysuria , frequency, incontinence or hesitancy.  Denies headaches, vertigo, seizures.  Denies  rash, lesions, or itch.     Current Medications (verified): 1)  Lipitor 10 Mg Tabs (Atorvastatin Calcium) .... At Bedtime 2)  Diovan Hct 80-12.5 Mg Tabs (Valsartan-Hydrochlorothiazide) .... Once Daily 3)  Gabapentin 800 Mg  Tabs (Gabapentin) .... One Tab By Mouth Qid 4)  Metformin Hcl 500 Mg Tabs (Metformin Hcl) .... Two Tabs By Mouth Every Morning and One Tab By Mouth Every Evening 5)  Cyclobenzaprine Hcl 10 Mg Tabs (Cyclobenzaprine Hcl) .... One Tab By Mouth Three Times A Day Prn 6)  Ambien 10 Mg  Tabs (Zolpidem Tartrate) .... Take 1 Tab By Mouth At Bedtime 7)  Symbicort 160-4.5 Mcg/act Aero (Budesonide-Formoterol Fumarate) .... 2 Puffs Twice Daily 8)  Singulair 10 Mg Tabs (Montelukast Sodium) .... Take 1 Tablet By Mouth Once A Day 9)  Oxycontin 40 Mg Xr12h-Tab (Oxycodone Hcl) .... Take 1 Tablet By Mouth Three Times A Day 10)  Klor-Con M20 20 Meq Cr-Tabs (Potassium Chloride Crys Cr) .... One Tab By Mouth Qd 11)  Flonase 50 Mcg/act Susp (Fluticasone Propionate) .... 2 Puffs Per Nostril Daily 12)  Nexium 40 Mg Cpdr (Esomeprazole Magnesium) .... Take 1 Capsule By Mouth Once A Day 13)  Proair Hfa 108 (90 Base) Mcg/act Aers (Albuterol Sulfate) .... Two Puffs By Mouth Every 6- 8 Hours Prn 14)  Diazepam 5 Mg Tabs (Diazepam) .... One Tab By Mouth Three Times A Day 15)  Proventil Hfa 108 (90 Base) Mcg/act Aers (Albuterol Sulfate) .... Two Puffs By Mouth Every 6 To 8 Hours As Needed  Allergies (verified): 1)  ! Asa 2)  ! Morphine  Past History:  Past medical, surgical, family and social histories (including risk factors) reviewed, and no changes noted (except as noted below).  Past Medical History: Reviewed history from 07/23/2007 and no changes required. Current Problems:  LOW BACK PAIN (ICD-724.2) HYPERTHYROIDISM (ICD-242.90) HYPERTENSION (ICD-401.9) DIABETES MELLITUS, TYPE II (ICD-250.00) DEPRESSION (ICD-311) ASTHMA (ICD-493.90) ANXIETY (ICD-300.00)    Anxiety Asthma Depression Diabetes mellitus, type  II Hypertension Hyperthyroidism Low back pain  Past Surgical History: Cholecystectomy(1996) Left knee arthroscopic surgery (1999) Back surgeries x3 (7425,9563,8756) Left salpingectomy secondary to ectopic pregnancy (1991) Partial hysterectomy (1991) Back surgery [facility] 2011  Family History: Reviewed history from 05/13/2008 and no changes required. Mom x65 HTN Heart Dz   died at age 43 secondary to heart disease, cervical cancer in 2009 Dad x61 MI DM Prostate  cancer Sister x4 Brothers x3 healthy  Social History: Reviewed history from 07/23/2007 and no changes required. Occupation: Disable  Single 1 child Never Smoked Alcohol use-no Drug use-no  Review of Systems      See HPI General:  Complains of fatigue. Eyes:  Denies blurring and discharge. MS:  Complains of joint pain, low back pain, mid back pain, muscle weakness, and stiffness. Psych:  Complains of anxiety, depression, and mental problems; denies suicidal thoughts/plans, thoughts of violence, and thoughts /plans of harming others. Endo:  Denies cold intolerance, excessive hunger, excessive thirst, excessive urination, heat intolerance, polyuria, and weight change. Heme:  Denies abnormal bruising and bleeding. Allergy:  Complains of seasonal allergies; denies hives or rash and itching eyes.  Physical Exam  General:  Well-developed,obese,in no acute distress; alert,appropriate and cooperative throughout examination HEENT: No facial asymmetry,  EOMI, No sinus tenderness, TM's Clear, oropharynx  pink and moist.   Chest: Clear to auscultation bilaterally.  CVS: S1, S2, No murmurs, No S3.   Abd: Soft, Nontender.  EP:PIRJJOACZ  ROM spine, hips, shoulders and knees.  Ext: No edema.   CNS: CN 2-12 intact, power tone and sensation normal throughout.   Skin: Intact, no visible lesions or rashes.  Psych: Good eye contact, normal affect.  Memory fair,  depressed appearing.    Impression & Recommendations:  Problem # 1:  VITAMIN D DEFICIENCY (ICD-268.9) Assessment Comment Only  Orders: T-Vitamin D (25-Hydroxy) ([id number]) pt to start vit D supplement  Problem # 2:  OBESITY (ICD-278.00) Assessment: Deteriorated  Ht: 65 (10/20/2009)   Wt: 279.25 (10/20/2009)   BMI: 46.64 (10/20/2009)  Problem # 3:  DIABETES MELLITUS, TYPE II (ICD-250.00) Assessment: Comment Only  Her updated medication list for this problem includes:    Diovan Hct 80-12.5 Mg Tabs  (Valsartan-hydrochlorothiazide) ..... Once daily    Metformin Hcl 500 Mg Tabs (Metformin hcl) .Marland Kitchen..Marland Kitchen Two tabs by mouth every morning and one tab by mouth every evening  Orders: T- Hemoglobin A1C ([id number])  Labs Reviewed: Creat: 0.64 (09/02/2008)    Reviewed HgBA1c results: 6.5 (06/21/2009)  6.2 (02/15/2009)  Problem # 4:  HYPERTENSION (ICD-401.9) Assessment: Improved  Her updated medication list for this problem includes:    Diovan Hct 80-12.5 Mg Tabs (Valsartan-hydrochlorothiazide) ..... Once daily  Orders: T-Basic Metabolic Panel [phone])  BP today: 124/82 Prior BP: 148/82 (06/21/2009)  Labs Reviewed: K+: 4.0 (09/02/2008) Creat: : 0.64 (09/02/2008)   Chol: 155 (09/02/2008)   HDL: 49 (09/02/2008)   LDL: 93 (09/02/2008)   TG: 63 (09/02/2008)  Problem # 5:  DEPRESSION (ICD-311) Assessment: Deteriorated  The following medications were removed from the medication list:    Prozac 20 Mg Caps (Fluoxetine hcl) ..... One cap by mouth qd Her updated medication list for this problem includes:    Diazepam 5 Mg Tabs (Diazepam) ..... One tab by mouth three times a day    Fluoxetine Hcl 20 Mg Caps (Fluoxetine hcl) .Marland Kitchen... Take 1 capsule by mouth once a day  Problem # 6:  LOW BACK PAIN (ICD-724.2) Assessment: Improved  Her updated medication list for this problem  includes:    Cyclobenzaprine Hcl 10 Mg Tabs (Cyclobenzaprine hcl) ..... One tab by mouth three times a day prn    Oxycontin 40 Mg Xr12h-tab (Oxycodone hcl) .Marland Kitchen... Take 1 tablet by mouth three times a day  Complete Medication List: 1)  Diovan Hct 80-12.5 Mg Tabs (Valsartan-hydrochlorothiazide) .... Once daily 2)  Gabapentin 800 Mg Tabs (Gabapentin) .... One tab by mouth qid 3)  Metformin Hcl 500 Mg Tabs (Metformin hcl) .... Two tabs by mouth every morning and one tab by mouth every evening 4)  Cyclobenzaprine Hcl 10 Mg Tabs (Cyclobenzaprine hcl) .... One tab by mouth three times a day prn 5)  Ambien 10 Mg Tabs (Zolpidem  tartrate) .... Take 1 tab by mouth at bedtime 6)  Symbicort 160-4.5 Mcg/act Aero (Budesonide-formoterol fumarate) .... 2 puffs twice daily 7)  Singulair 10 Mg Tabs (Montelukast sodium) .... Take 1 tablet by mouth once a day 8)  Oxycontin 40 Mg Xr12h-tab (Oxycodone hcl) .... Take 1 tablet by mouth three times a day 9)  Klor-con M20 20 Meq Cr-tabs (Potassium chloride crys cr) .... One tab by mouth qd 10)  Flonase 50 Mcg/act Susp (Fluticasone propionate) .... 2 puffs per nostril daily 11)  Nexium 40 Mg Cpdr (Esomeprazole magnesium) .... Take 1 capsule by mouth once a day 12)  Proair Hfa 108 (90 Base) Mcg/act Aers (Albuterol sulfate) .... Two puffs by mouth every 6- 8 hours prn 13)  Diazepam 5 Mg Tabs (Diazepam) .... One tab by mouth three times a day 14)  Proventil Hfa 108 (90 Base) Mcg/act Aers (Albuterol sulfate) .... Two puffs by mouth every 6 to 8 hours as needed 15)  Fluoxetine Hcl 20 Mg Caps (Fluoxetine hcl) .... Take 1 capsule by mouth once a day 16)  Vitamin D (ergocalciferol) 50000 Unit Caps (Ergocalciferol) .... One capsule once weekly 17)  Lipitor 20 Mg Tabs (Atorvastatin calcium) .... Take 1 tab by mouth at bedtime  Other Orders: T-Hepatic Function [phone]) T-Lipid Profile [id number]) T-CBC w/Diff [phone]) T-TSH [phone])  Patient Instructions: 1)  Please schedule a follow-up appointment in 2 months. 2)  It is important that you exercise regularly at least 20 minutes 5 times a week. If you develop chest pain, have severe difficulty breathing, or feel very tired , stop exercising immediately and seek medical attention. 3)  You need to lose weight. Consider a lower calorie diet and regular exercise.  4)  BMP prior to visit, ICD-9: 5)  Hepatic Panel prior to visit, ICD-9: 6)  Lipid Panel prior to visit, ICD-9:   fasting labs tioday 7)  CBC w/ Diff prior to visit, ICD-9: 8)  HbgA1C prior to visit, ICD-9: 9)  TSH prior to visit, ICD-9: 10)  Vitamin  D Prescriptions: LIPITOR 20 MG TABS (ATORVASTATIN CALCIUM) Take 1 tab by mouth at bedtime  #30 x 4   Entered and Authorized by:   Syliva Overman MD   Signed by:   Syliva Overman MD on 10/21/2009   Method used:   Printed then faxed to ...       [facility] [address] [phone]* (retail)       [address] [place], [place]  [address]       Ph: [phone]       Fax: [phone]   RxID:   [id number] VITAMIN D (ERGOCALCIFEROL) 50000 UNIT CAPS (ERGOCALCIFEROL) one capsule once weekly  #4 x 4   Entered and Authorized by:   Syliva Overman MD  Signed by:   Syliva Overman MD on 10/21/2009   Method used:   Electronically to        [facility] [phone]* (retail)       [address] [place], [place]  [address]       Ph: [phone]       Fax: [phone]   RxID:   [id number] FLUOXETINE HCL 20 MG CAPS (FLUOXETINE HCL) Take 1 capsule by mouth once a day  #30 x 3   Entered and Authorized by:   Syliva Overman MD   Signed by:   Syliva Overman MD on 10/20/2009   Method used:   Electronically to        [facility] [address] [phone]* (retail)       [address] [place]  [address]       Ph: [phone]       Fax: [phone]   RxID:   [id number]

## 2010-06-02 NOTE — Assessment & Plan Note (Signed)
Summary: AP ERFX RT SHOULDER XR THERE/MEDICARE/MEDICAID/BSF   Visit Type:  Initial Consult Referring Provider:  ER  CC:  pain right shoulder .  History of Present Illness: I saw Angela [name] in the office today for an initial visit.  She is a 50 years old woman with the complaint of acute onset of severe sharp deep aching non radiating right shoulder pain associated with loss of motion     Current Medications (verified): 1)  Lipitor 10 Mg Tabs (Atorvastatin Calcium) .... At Bedtime 2)  Diovan Hct 80-12.5 Mg Tabs (Valsartan-Hydrochlorothiazide) .... Once Daily 3)  Diazepam 5 Mg/ml Soln (Diazepam) .... One Tab Po  Three Times A Day 4)  Gabapentin 800 Mg  Tabs (Gabapentin) .... One Tab By Mouth Qid 5)  Metformin Hcl 500 Mg Tabs (Metformin Hcl) .... Two Tabs By Mouth Every Morning and One Tab By Mouth Every Evening 6)  Cyclobenzaprine Hcl 10 Mg Tabs (Cyclobenzaprine Hcl) .... One Tab By Mouth Three Times A Day Prn 7)  Ambien 10 Mg Tabs (Zolpidem Tartrate) .... Take 1 Tab By Mouth At Bedtime 8)  Symbicort 160-4.5 Mcg/act Aero (Budesonide-Formoterol Fumarate) .... 2 Puffs Twice Daily 9)  Singulair 10 Mg Tabs (Montelukast Sodium) .... Take 1 Tablet By Mouth Once A Day 10)  Oxycontin 40 Mg Xr12h-Tab (Oxycodone Hcl) .... Take 1 Tablet By Mouth Three Times A Day 11)  Prozac 20 Mg Caps (Fluoxetine Hcl) .... One Cap By Mouth Qd 12)  Klor-Con M20 20 Meq Cr-Tabs (Potassium Chloride Crys Cr) .... One Tab By Mouth Qd 13)  Flonase 50 Mcg/act Susp (Fluticasone Propionate) .... 2 Puffs Per Nostril Daily 14)  Nexium 40 Mg Cpdr (Esomeprazole Magnesium) .... Take 1 Capsule By Mouth Once A Day 15)  Proventil Hfa 108 (90 Base) Mcg/act Aers (Albuterol Sulfate) .... 2 Puffs Every 6 To 8 Hours As Needed 16)  Fluconazole 150 Mg Tabs (Fluconazole) .... One Tab By Mouth 17)  Tamiflu 75 Mg Caps (Oseltamivir Phosphate) .... Take 1 Capsule By Mouth Two Times A Day 18)  Penicillin V Potassium 500 Mg Tabs (Penicillin  V Potassium) .... Take 1 Tablet By Mouth Three Times A Day 19)  Tessalon Perles 100 Mg Caps (Benzonatate) .... Take 1 Capsule By Mouth Three Times A Day 20)  Fluconazole 150 Mg Tabs (Fluconazole) .... Take 1 Tablet By Mouth Once A Day As Needed  Allergies (verified): 1)  ! Asa 2)  ! Morphine  Past History:  Past Medical History: Last updated: 07/23/2007 Current Problems:  LOW BACK PAIN (ICD-724.2) HYPERTHYROIDISM (ICD-242.90) HYPERTENSION (ICD-401.9) DIABETES MELLITUS, TYPE II (ICD-250.00) DEPRESSION (ICD-311) ASTHMA (ICD-493.90) ANXIETY (ICD-300.00)    Anxiety Asthma Depression Diabetes mellitus, type II Hypertension Hyperthyroidism Low back pain  Past Surgical History: Last updated: 07/23/2007 Cholecystectomy(1996) Left knee arthroscopic surgery (1999) Back surgeries x3 [id number]) Left salpingectomy secondary to ectopic pregnancy (1991) Partial hysterectomy (1991)  Family History: Last updated: May 23, 2008 Mom x65 HTN Heart Dz   died at age 45 secondary to heart disease, cervical cancer in 2009 Dad x61 MI DM Prostate cancer Sister x4 Brothers x3 healthy  Social History: Last updated: 07/23/2007 Occupation: Disable  Single 1 child Never Smoked Alcohol use-no Drug use-no  Risk Factors: Smoking Status: never (08/26/2008)  Review of Systems       MSK see hpi NEURO denies numbness CON: denies weight loss   Physical Exam  Skin:  intact without lesions or rashes Cervical Nodes:  no significant adenopathy Psych:  alert and cooperative; normal mood and affect;  normal attention span and concentration   Shoulder/Elbow Exam  General:    Well-developed, well-nourished, normal body habitus; no deformities, normal grooming.    Skin:    Intact, no scars, lesions, rashes, cafe au lait spots or bruising.    Inspection:    Inspection is normal.    Palpation:    tenderness R-deltoid: and tenderness R-infrascapular   Vascular:    Radial, ulnar,  brachial, and axillary pulses 2+ and symmetric; capillary refill less than 2 seconds; no evidence of ischemia, clubbing, or cyanosis.    Sensory:    Gross sensation intact in the upper extremities.    Motor:    decreased R-supraspinatus, decreased R-ER'S, and decreased R-IR'S.    left shoulder normal   Reflexes:    Normal reflexes in the upper extremities.    Shoulder Exam:    Right:    Inspection:  Abnormal    Palpation:  Abnormal    Stability:  CNT     Swelling:  no    Erythema:  no    ROM right shoulder impossible pain the cause patient crying     Left:    Inspection:  Normal    Palpation:  Normal    Stability:  stable    Range of Motion:       Flexion-Active: 180       External Rotation : 50       Interior Rotation : T6  Forearm Exam:    Right:    Inspection:  Normal    Palpation:  Normal    Left:    Inspection:  Normal    Palpation:  Normal  Elbow Exam:    Right:    Inspection:  Normal    Palpation:  Normal    Left:    Inspection:  Normal    Palpation:  Normal  Tinel's:    Tinel's negative over cubital, pronator, carpal, and Guyon's area.    Impingement Sign NEER:    Right positive; Left negative Impingement Sign HAWKINS:    Left negative Apprehension Sign:    Left negative   Impression & Recommendations:  Problem # 1:  CALCIFIC TENDONITIS SHOULDER (ICD-726.11) Assessment New The x-rays were done at [facility]. The report and the films have been reviewed. I see Ca deposits but no fracture   Verbal consent obtained/The shoulder was injected with depomedrol 40mg /cc and sensorcaine .25% . There were no complications  Orders: New Patient Level III (27253) Joint Aspirate / Injection, Large (20610) Depo- Medrol 40mg  (J1030)  Patient Instructions: 1)  Please schedule a follow-up appointment in 2 weeks. 2)  wear sling  3)  take percocet  4)  return in 2 weeks  5)  You have received an injection of cortisone today. You may experience increased pain at  the injection site. Apply ice pack to the area for 20 minutes every 2 hours and take 2 xtra strength tylenol every 8 hours. This increased pain will usually resolve in 24 hours. The injection will take effect in 3-10 days.

## 2010-06-02 NOTE — Assessment & Plan Note (Signed)
Summary: LEGS SWOLLEN   Vital Signs:  Patient profile:   50 year old female Menstrual status:  hysterectomy Height:      65 inches Weight:      288.12 pounds BMI:     48.12 Pulse rate:   88 / minute Pulse rhythm:   regular BP sitting:   138 / 70  (left arm)  Nutrition Counseling: Patient's BMI is greater than 25 and therefore counseled on weight management options.  History of Present Illness: Pt reports that while away on a family vacation she overate everything , and since then has gained approx 8 pounds in less than 1 week, is having difficulty breathing, she feels so stuffed, and she also notes leg swelling. She denies chesttightness, pND or orthopnea. She sttaes that she is slightly lessstred and depressed after being energized by her family. She deniesany recent fever or chills, head or chest congestion.  Allergies: 1)  ! Asa 2)  ! Morphine  Review of Systems      See HPI General:  Complains of fatigue. Eyes:  Denies blurring and discharge. CV:  Complains of difficulty breathing while lying down, shortness of breath with exertion, and swelling of feet; denies chest pain or discomfort, difficulty breathing at night, and palpitations; still using 4 pillows chronically. GI:  Complains of abdominal pain and indigestion; denies constipation, diarrhea, nausea, and vomiting; feels overstuffed and full. GU:  Denies dysuria and urinary frequency. MS:  Complains of joint pain, low back pain, mid back pain, and stiffness. Endo:  Denies excessive thirst and excessive urination. Heme:  Denies abnormal bruising and bleeding. Allergy:  Denies hives or rash and itching eyes.  Physical Exam  General:  Well-developed,obese,in no acute distress; alert,appropriate and cooperative throughout examination HEENT: No facial asymmetry,  EOMI, No sinus tenderness, TM's Clear, oropharynx  pink and moist.   Chest: Clear to auscultation bilaterally.  CVS: S1, S2, No murmurs, No S3.   Abd: Soft,  Nontender.  EA:[id number]  ROM spine, hips, shoulders and knees.  Ext: 1 to 2plus ptting  edema.   CNS: CN 2-12 intact, power tone and sensation normal throughout.   Skin: Intact, no visible lesions or rashes.  Psych: Good eye contact, normal affect.  Memory fair, not  depressed appearing.    Impression & Recommendations:  Problem # 1:  DYSPNEA (ICD-786.05) Assessment Deteriorated  Orders: CXR- 2view (CXR)  Problem # 2:  LEG EDEMA (ICD-782.3) Assessment: Deteriorated  Her updated medication list for this problem includes:    Diovan Hct 80-12.5 Mg Tabs (Valsartan-hydrochlorothiazide) ..... Once daily  Orders: CXR- 2view (CXR) Furosemide- Lasix Injection (J1940) Admin of Therapeutic Inj  intramuscular or subcutaneous (47829)  Problem # 3:  OBESITY (ICD-278.00) Assessment: Deteriorated  Ht: 65 (11/03/2009)   Wt: 288.12 (11/03/2009)   BMI: 48.12 (11/03/2009)  Problem # 4:  DIABETES MELLITUS, TYPE II (ICD-250.00) Assessment: Unchanged  Her updated medication list for this problem includes:    Diovan Hct 80-12.5 Mg Tabs (Valsartan-hydrochlorothiazide) ..... Once daily    Metformin Hcl 500 Mg Tabs (Metformin hcl) .Marland Kitchen..Marland Kitchen Two tabs by mouth every morning and one tab by mouth every evening  Labs Reviewed: Creat: 0.56 (10/20/2009)    Reviewed HgBA1c results: 6.5 (10/20/2009)  6.5 (06/21/2009)  Problem # 5:  HYPERTENSION (ICD-401.9) Assessment: Unchanged  Her updated medication list for this problem includes:    Diovan Hct 80-12.5 Mg Tabs (Valsartan-hydrochlorothiazide) ..... Once daily  BP today: 138/70 Prior BP: 124/82 (10/20/2009)  Labs Reviewed: K+: 4.0 (10/20/2009) Creat: :  0.56 (10/20/2009)   Chol: 186 (10/20/2009)   HDL: 48 (10/20/2009)   LDL: 119 (10/20/2009)   TG: 97 (10/20/2009)  Complete Medication List: 1)  Diovan Hct 80-12.5 Mg Tabs (Valsartan-hydrochlorothiazide) .... Once daily 2)  Gabapentin 800 Mg Tabs (Gabapentin) .... One tab by mouth qid 3)  Metformin  Hcl 500 Mg Tabs (Metformin hcl) .... Two tabs by mouth every morning and one tab by mouth every evening 4)  Cyclobenzaprine Hcl 10 Mg Tabs (Cyclobenzaprine hcl) .... One tab by mouth three times a day prn 5)  Ambien 10 Mg Tabs (Zolpidem tartrate) .... Take 1 tab by mouth at bedtime 6)  Symbicort 160-4.5 Mcg/act Aero (Budesonide-formoterol fumarate) .... 2 puffs twice daily 7)  Singulair 10 Mg Tabs (Montelukast sodium) .... Take 1 tablet by mouth once a day 8)  Oxycontin 40 Mg Xr12h-tab (Oxycodone hcl) .... Take 1 tablet by mouth three times a day 9)  Klor-con M20 20 Meq Cr-tabs (Potassium chloride crys cr) .... One tab by mouth qd 10)  Flonase 50 Mcg/act Susp (Fluticasone propionate) .... 2 puffs per nostril daily 11)  Nexium 40 Mg Cpdr (Esomeprazole magnesium) .... Take 1 capsule by mouth once a day 12)  Proair Hfa 108 (90 Base) Mcg/act Aers (Albuterol sulfate) .... Two puffs by mouth every 6- 8 hours prn 13)  Diazepam 5 Mg Tabs (Diazepam) .... One tab by mouth three times a day 14)  Proventil Hfa 108 (90 Base) Mcg/act Aers (Albuterol sulfate) .... Two puffs by mouth every 6 to 8 hours as needed 15)  Fluoxetine Hcl 20 Mg Caps (Fluoxetine hcl) .... Take 1 capsule by mouth once a day 16)  Vitamin D (ergocalciferol) 50000 Unit Caps (Ergocalciferol) .... One capsule once weekly 17)  Lipitor 20 Mg Tabs (Atorvastatin calcium) .... Take 1 tab by mouth at bedtime  Patient Instructions: 1)  F/U as before. 2)  PT to come by at 1pm in  2weeks for weight only, no check in or copay. 3)  PLs follow a primarily liquid diet, water, veg, glucerna x 1 daily, and lots of watery veg. 4)  PLsno sweet drinks....ELIMINATION and DETOX  diet. 5)  you have eaten 9 piunds of weight in 2 weeks!!!   Medication Administration  Injection # 1:    Medication: Furosemide- Lasix Injection    Diagnosis: LEG EDEMA (ICD-782.3)    Route: IM    Site: LUOQ gluteus    Exp Date: 06/30/2010    Lot #: [id number]    Mfr: hospira     Comments: lasix 10mg  given    Patient tolerated injection without complications    Given by: Adella Hare LPN (November 03, 8293 3:13 PM)  Orders Added: 1)  Est. Patient Level III [62130] 2)  CXR- 2view [CXR] 3)  Furosemide- Lasix Injection [J1940] 4)  Admin of Therapeutic Inj  intramuscular or subcutaneous [86578]

## 2010-06-02 NOTE — Letter (Signed)
Summary: History form  History form   Imported By: Jacklynn Ganong 05/20/2009 14:15:25  _____________________________________________________________________  External Attachment:    Type:   Image     Comment:   External Document

## 2010-06-02 NOTE — Miscellaneous (Signed)
Summary: NARC REFILL  Clinical Lists Changes  Medications: Rx of OXYCONTIN 40 MG XR12H-TAB (OXYCODONE HCL) Take 1 tablet by mouth three times a day;  #90 x 0;  Signed;  Entered by: Everitt Amber;  Authorized by: Syliva Overman MD;  Method used: Handwritten    Prescriptions: OXYCONTIN 40 MG XR12H-TAB (OXYCODONE HCL) Take 1 tablet by mouth three times a day  #90 x 0   Entered by:   Everitt Amber   Authorized by:   Syliva Overman MD   Signed by:   Everitt Amber on 05/17/2009   Method used:   Handwritten   RxID:   [id number]

## 2010-06-02 NOTE — Progress Notes (Signed)
Summary: pain  Phone Note Call from Patient   Summary of Call: pt leg knee and elbow hurting. pt wants to know if she can get a shot. [phone] Initial call taken by: Rudene Anda,  May 24, 2010 2:57 PM  Follow-up for Phone Call        okm nurse visit for toradol 60mg  and depomedrol 80Mg  Im , pls let her know, aklso order uric acid level to ensure this is nl, since several joiunts are hurting Follow-up by: Syliva Overman MD,  May 24, 2010 5:06 PM  Additional Follow-up for Phone Call Additional follow up Details #1::        lab ordered, called patient, no answer Additional Follow-up by: Adella Hare LPN,  May 25, 2010 9:36 AM    Additional Follow-up for Phone Call Additional follow up Details #2::    patient here for nurse visit Follow-up by: Adella Hare LPN,  May 25, 2010 1:37 PM

## 2010-06-02 NOTE — Assessment & Plan Note (Signed)
Summary: shot  Nurse Visit   Vital Signs:  Patient profile:   50 year old female Menstrual status:  hysterectomy Height:      65 inches Weight:      282 pounds BMI:     47.10 O2 Sat:      97 % Temp:     98.6 degrees F oral Pulse rate:   89 / minute Pulse rhythm:   regular Resp:     16 per minute BP sitting:   146 / 80  (left arm) Cuff size:   xl  Vitals Entered By: Everitt Amber LPN (May 09, 2010 2:21 PM)  Nutrition Counseling: Patient's BMI is greater than 25 and therefore counseled on weight management options.  CC:  Chest congestion and nasal congestion and producing yellowish phlegm since thursday. Marland Kitchen  History of Present Illness: 1 weeek h/o head and chest congestion, with yel;low nasal drainage and cough productive of green sputum. She has aklso ahd intermittent fever , chills and wheezing. Sheis also experiencing increased back pain.   Patient Instructions: 1)  F/u as before. 2)  You are being treated for sinusitis and bronchitis. 3)  Pls call if the elbow does not improve in 2 weeks, you will be referred to ortho 4)  Injection today for left elbow pain 5)  Labs today   Review of Systems      See HPI General:  Complains of chills, fatigue, and sweats; 1 week h/o head and chest congestion. Eyes:  Denies blurring and discharge. ENT:  Complains of nasal congestion, postnasal drainage, and sinus pressure; denies earache and sore throat; 1 week h/o yellow nasal drainage. CV:  Denies chest pain or discomfort, palpitations, and swelling of feet. Resp:  Complains of cough, sputum productive, and wheezing; green sputum x 1 week. GI:  Denies abdominal pain, constipation, diarrhea, nausea, and vomiting. GU:  Denies dysuria and urinary frequency. MS:  Complains of joint pain, low back pain, and mid back pain; left elbow pain with reduced mobility x 1 month. Psych:  Complains of anxiety and depression; denies mental problems, suicidal thoughts/plans, thoughts of violence,  and unusual visions or sounds; controlled. Endo:  Denies cold intolerance, excessive hunger, excessive thirst, and excessive urination. Heme:  Denies abnormal bruising and bleeding. Allergy:  Complains of seasonal allergies.   Physical Exam  General:  Well-developed,obese,in no acute distress; alert,appropriate and cooperative throughout examination HEENT: No facial asymmetry,  EOMI, maxillary sinus tenderness, TM's Clear, oropharynx  pink and moist.   Chest: decreased air entry, bilateral crackles and wheezes CVS: S1, S2, No murmurs, No S3.   Abd: Soft, Nontender.  MS: dereased  ROM spine,adequate in hips, shoulders and reduced in knees.Left elbow tender over lateral epicondyle with reduced mobility  Ext: No edema.   CNS: CN 2-12 intact, power tone and sensation normal throughout.   Skin: Intact, no visible lesions or rashes.  Psych: Good eye contact, normal affect.  Memory intact, not anxious or depressed appearing.    Impression & Recommendations:  Problem # 1:  ACUTE BRONCHITIS (ICD-466.0) Assessment Comment Only  Her updated medication list for this problem includes:    Symbicort 160-4.5 Mcg/act Aero (Budesonide-formoterol fumarate) .Marland Kitchen... 2 puffs twice daily    Singulair 10 Mg Tabs (Montelukast sodium) .Marland Kitchen... Take 1 tablet by mouth once a day    Proair Hfa 108 (90 Base) Mcg/act Aers (Albuterol sulfate) .Marland Kitchen..Marland Kitchen Two puffs by mouth every 6- 8 hours prn    Proventil Hfa 108 (90 Base) Mcg/act  Aers (Albuterol sulfate) .Marland Kitchen..Marland Kitchen Two puffs by mouth every 6 to 8 hours as needed    Doxycycline Hyclate 100 Mg Caps (Doxycycline hyclate) .Marland Kitchen... Take 1 capsule by mouth two times a day    Tessalon Perles 100 Mg Caps (Benzonatate) .Marland Kitchen... Take 1 capsule by mouth three times a day  Orders: Est. Patient Level IV (19147) Medicare Electronic Prescription [phone]) Rocephin  250mg  (O1308) Admin of Therapeutic Inj  intramuscular or subcutaneous (65784)  Problem # 2:  ELBOW PAIN, LEFT  (ICD-719.42) Assessment: Comment Only  Orders: Ketorolac-Toradol 15mg  (O9629)  Problem # 3:  BACK PAIN, THORACIC REGION (ICD-724.1) Assessment: Comment Only  Her updated medication list for this problem includes:    Cyclobenzaprine Hcl 10 Mg Tabs (Cyclobenzaprine hcl) ..... One tab by mouth three times a day prn    Oxycontin 40 Mg Xr12h-tab (Oxycodone hcl) .Marland Kitchen... Take 1 tablet by mouth three times a day  Problem # 4:  OBESITY (ICD-278.00) Assessment: Improved  Ht: 65 (05/09/2010)   Wt: 282 (05/09/2010)   BMI: 47.10 (05/09/2010)  Problem # 5:  HYPERTENSION (ICD-401.9) Assessment: Deteriorated  Her updated medication list for this problem includes:    Diovan Hct 80-12.5 Mg Tabs (Valsartan-hydrochlorothiazide) ..... Once daily  BP today: 146/80 Prior BP: 130/80 (03/31/2010) Patient advised to follow low sodium diet rich in fruit and vegetables, and to commit to at least 30 minutes 5 days per week of regular exercise , to improve blood presure control.  seeing card who is eval;uating palpitations, will lv BP mx to him at this time also Labs Reviewed: K+: 4.0 (10/20/2009) Creat: : 0.56 (10/20/2009)   Chol: 186 (10/20/2009)   HDL: 48 (10/20/2009)   LDL: 119 (10/20/2009)   TG: 97 (10/20/2009)  Problem # 6:  ACUTE SINUSITIS, UNSPECIFIED (ICD-461.9) Assessment: Comment Only  Her updated medication list for this problem includes:    Flonase 50 Mcg/act Susp (Fluticasone propionate) .Marland Kitchen... 2 puffs per nostril daily    Doxycycline Hyclate 100 Mg Caps (Doxycycline hyclate) .Marland Kitchen... Take 1 capsule by mouth two times a day    Tessalon Perles 100 Mg Caps (Benzonatate) .Marland Kitchen... Take 1 capsule by mouth three times a day  Orders: Est. Patient Level IV (52841) Medicare Electronic Prescription (L2440)  Complete Medication List: 1)  Diovan Hct 80-12.5 Mg Tabs (Valsartan-hydrochlorothiazide) .... Once daily 2)  Gabapentin 800 Mg Tabs (Gabapentin) .... One tab by mouth qid 3)  Metformin Hcl 500 Mg Tabs  (Metformin hcl) .... Two tabs by mouth every morning and one tab by mouth every evening 4)  Cyclobenzaprine Hcl 10 Mg Tabs (Cyclobenzaprine hcl) .... One tab by mouth three times a day prn 5)  Ambien 10 Mg Tabs (Zolpidem tartrate) .... Take 1 tab by mouth at bedtime 6)  Symbicort 160-4.5 Mcg/act Aero (Budesonide-formoterol fumarate) .... 2 puffs twice daily 7)  Singulair 10 Mg Tabs (Montelukast sodium) .... Take 1 tablet by mouth once a day 8)  Oxycontin 40 Mg Xr12h-tab (Oxycodone hcl) .... Take 1 tablet by mouth three times a day 9)  Klor-con M20 20 Meq Cr-tabs (Potassium chloride crys cr) .... One tab by mouth qd 10)  Flonase 50 Mcg/act Susp (Fluticasone propionate) .... 2 puffs per nostril daily 11)  Nexium 40 Mg Cpdr (Esomeprazole magnesium) .... Take 1 capsule by mouth once a day 12)  Proair Hfa 108 (90 Base) Mcg/act Aers (Albuterol sulfate) .... Two puffs by mouth every 6- 8 hours prn 13)  Diazepam 5 Mg Tabs (Diazepam) .... One tab by mouth three  times a day 14)  Proventil Hfa 108 (90 Base) Mcg/act Aers (Albuterol sulfate) .... Two puffs by mouth every 6 to 8 hours as needed 15)  Fluoxetine Hcl 20 Mg Caps (Fluoxetine hcl) .... Take 1 capsule by mouth once a day 16)  Vitamin D (ergocalciferol) 50000 Unit Caps (Ergocalciferol) .... One capsule once weekly 17)  Lipitor 20 Mg Tabs (Atorvastatin calcium) .... Take 1 tab by mouth at bedtime 18)  Nitrolingual 0.4 Mg/spray Soln (Nitroglycerin) .... Use as directed 19)  Doxycycline Hyclate 100 Mg Caps (Doxycycline hyclate) .... Take 1 capsule by mouth two times a day 20)  Tessalon Perles 100 Mg Caps (Benzonatate) .... Take 1 capsule by mouth three times a day 21)  Fluconazole 150 Mg Tabs (Fluconazole) .... Take 1 tablet by mouth once a day as needed for vaginal itching  CC: Chest congestion and nasal congestion, producing yellowish phlegm since thursday.  Comments Didn't bring meds    Current Medications (verified): 1)  Diovan Hct 80-12.5 Mg  Tabs (Valsartan-Hydrochlorothiazide) .... Once Daily 2)  Gabapentin 800 Mg  Tabs (Gabapentin) .... One Tab By Mouth Qid 3)  Metformin Hcl 500 Mg Tabs (Metformin Hcl) .... Two Tabs By Mouth Every Morning and One Tab By Mouth Every Evening 4)  Cyclobenzaprine Hcl 10 Mg Tabs (Cyclobenzaprine Hcl) .... One Tab By Mouth Three Times A Day Prn 5)  Ambien 10 Mg Tabs (Zolpidem Tartrate) .... Take 1 Tab By Mouth At Bedtime 6)  Symbicort 160-4.5 Mcg/act Aero (Budesonide-Formoterol Fumarate) .... 2 Puffs Twice Daily 7)  Singulair 10 Mg Tabs (Montelukast Sodium) .... Take 1 Tablet By Mouth Once A Day 8)  Oxycontin 40 Mg Xr12h-Tab (Oxycodone Hcl) .... Take 1 Tablet By Mouth Three Times A Day 9)  Klor-Con M20 20 Meq Cr-Tabs (Potassium Chloride Crys Cr) .... One Tab By Mouth Qd 10)  Flonase 50 Mcg/act Susp (Fluticasone Propionate) .... 2 Puffs Per Nostril Daily 11)  Nexium 40 Mg Cpdr (Esomeprazole Magnesium) .... Take 1 Capsule By Mouth Once A Day 12)  Proair Hfa 108 (90 Base) Mcg/act Aers (Albuterol Sulfate) .... Two Puffs By Mouth Every 6- 8 Hours Prn 13)  Diazepam 5 Mg Tabs (Diazepam) .... One Tab By Mouth Three Times A Day 14)  Proventil Hfa 108 (90 Base) Mcg/act Aers (Albuterol Sulfate) .... Two Puffs By Mouth Every 6 To 8 Hours As Needed 15)  Fluoxetine Hcl 20 Mg Caps (Fluoxetine Hcl) .... Take 1 Capsule By Mouth Once A Day 16)  Vitamin D (Ergocalciferol) 50000 Unit Caps (Ergocalciferol) .... One Capsule Once Weekly 17)  Lipitor 20 Mg Tabs (Atorvastatin Calcium) .... Take 1 Tab By Mouth At Bedtime 18)  Nitrolingual 0.4 Mg/spray Soln (Nitroglycerin) .... Use As Directed  Allergies (verified): 1)  ! Asa 2)  ! Morphine  Medication Administration  Injection # 1:    Medication: Ketorolac-Toradol 15mg     Diagnosis: ELBOW PAIN, LEFT (ICD-719.42)    Route: IM    Site: LUOQ gluteus    Exp Date: 09/30/2011    Lot #: [id number]    Mfr: [email or web address]    Comments: toradol 60mg  given    Patient tolerated injection  without complications    Given by: Adella Hare LPN (May 09, 2010 4:48 PM)  Injection # 2:    Medication: Rocephin  250mg     Diagnosis: ACUTE BRONCHITIS (ICD-466.0)    Route: IM    Site: RUOQ gluteus    Exp Date: 05/14    Lot #: [id number]    Mfr:  [email or web address]     Comments: rocephin 500mg  given    Patient tolerated injection without complications    Given by: Adella Hare LPN (May 09, 2010 4:49 PM)  Orders Added: 1)  Est. Patient Level IV [99214] 2)  Medicare Electronic Prescription [G8553] 3)  Ketorolac-Toradol 15mg  [J1885] 4)  Rocephin  250mg  [J0696] 5)  Admin of Therapeutic Inj  intramuscular or subcutaneous [96372] Prescriptions: FLUCONAZOLE 150 MG TABS (FLUCONAZOLE) Take 1 tablet by mouth once a day as needed for vaginal itching  #3 x 0   Entered and Authorized by:   Syliva Overman MD   Signed by:   Syliva Overman MD on 05/09/2010   Method used:   Electronically to        [facility] [address] [phone] (retail)       [address] [place], [place]  [address]       Ph: [phone]       Fax: [phone]   RxID:   [id number] TESSALON PERLES 100 MG CAPS (BENZONATATE) Take 1 capsule by mouth three times a day  #30 x 0   Entered and Authorized by:   Syliva Overman MD   Signed by:   Syliva Overman MD on 05/09/2010   Method used:   Electronically to        [facility] [address] [phone]* (retail)       [address], [place]  [address]       Ph: [phone]       Fax: [phone]   RxID:   [id number] PREDNISONE (PAK) 5 MG TABS (PREDNISONE) Use as directed  #21 x 0   Entered and Authorized by:   Syliva Overman MD   Signed by:   Syliva Overman MD on 05/09/2010   Method used:   Electronically to        [facility] [address] [phone]* (retail)       [address] [place], [place]  [address]       Ph: [phone]       Fax: [phone]   RxID:   [id number] DOXYCYCLINE HYCLATE 100 MG CAPS (DOXYCYCLINE HYCLATE) Take 1 capsule by  mouth two times a day  #20 x 0   Entered and Authorized by:   Syliva Overman MD   Signed by:   Syliva Overman MD on 05/09/2010   Method used:   Electronically to        [facility] [address] [phone]* (retail)       [address] [place]  [address]       Ph: [phone]       Fax: [phone]   RxID:   [id number]  After recieving Rocephin injection, patient c/o it burning and she stated she felt dizzy. Advised patient to sit down for a few minutes in the waiting room to make sure it wasn't a reaction but she stated she had to get home.  Medication Administration  Injection # 1:    Medication: Ketorolac-Toradol 15mg     Diagnosis: ELBOW PAIN, LEFT (HUD-149.70)    Route: IM    Site: LUOQ gluteus    Exp Date: 09/30/2011    Lot #: [id number]    Mfr: [email or web address]    Comments: toradol 60mg  given    Patient tolerated injection without complications    Given by: Adella Hare LPN (  May 09, 2010 4:48 PM)  Injection # 2:    Medication: Rocephin  250mg     Diagnosis: ACUTE BRONCHITIS (ICD-466.0)    Route: IM    Site: RUOQ gluteus    Exp Date: 05/14    Lot #: [id number]    Mfr: [email or web address]     Comments: rocephin 500mg  given    Patient tolerated injection without complications    Given by: Adella Hare LPN (May 09, 2010 4:49 PM)  Orders Added: 1)  Est. Patient Level IV [04540] 2)  Medicare Electronic Prescription [G8553] 3)  Ketorolac-Toradol 15mg  [J1885] 4)  Rocephin  250mg  [J0696] 5)  Admin of Therapeutic Inj  intramuscular or subcutaneous [98119]

## 2010-06-02 NOTE — Progress Notes (Signed)
Summary: rash  Phone Note Call from Patient   Summary of Call: dr saw her rash and she needs to be ref. [facility] derm. asap [phone] call her cell # with appt [phone] Initial call taken by: Lind Guest,  January 13, 2010 9:45 AM  Follow-up for Phone Call        pt has appt with dr. Jorja Loa office with the PA on 02/14/2010 11:30.  called and left message for pt Follow-up by: Rudene Anda,  January 19, 2010 10:22 AM

## 2010-06-02 NOTE — Assessment & Plan Note (Signed)
Summary: INJ  Nurse Visit   Vital Signs:  Patient profile:   50 year old female Menstrual status:  hysterectomy Weight:      288 pounds BP sitting:   160 / 92  (right arm) Comments patient complains of joint pain knee and elbows   Allergies: 1)  ! Asa 2)  ! Morphine  Medication Administration  Injection # 1:    Medication: Depo- Medrol 80mg     Diagnosis: ELBOW PAIN, LEFT (MVH-846.96)    Route: IM    Site: RUOQ gluteus    Exp Date: 07/12    Lot #: Gunnar [id number]    Mfr: Pharmacia    Patient tolerated injection without complications    Given by: Adella Hare LPN (May 25, 2010 3:02 PM)  Injection # 2:    Medication: Ketorolac-Toradol 15mg     Diagnosis: ELBOW PAIN, LEFT (ICD-719.42)    Route: IM    Site: LUOQ gluteus    Exp Date: 09/30/2011    Lot #: [id number]    Mfr: novaplus    Comments: toradol 60mg  given    Patient tolerated injection without complications    Given by: Adella Hare LPN (May 25, 2010 3:03 PM)  Orders Added: 1)  Depo- Medrol 80mg  [J1040] 2)  Ketorolac-Toradol 15mg  [J1885] 3)  Admin of Therapeutic Inj  intramuscular or subcutaneous [96372]   Medication Administration  Injection # 1:    Medication: Depo- Medrol 80mg     Diagnosis: ELBOW PAIN, LEFT (ICD-719.42)    Route: IM    Site: RUOQ gluteus    Exp Date: 07/12    Lot #: Gunnar Bulla    Mfr: Pharmacia    Patient tolerated injection without complications    Given by: Adella Hare LPN (May 25, 2010 3:02 PM)  Injection # 2:    Medication: Ketorolac-Toradol 15mg     Diagnosis: ELBOW PAIN, LEFT (ICD-719.42)    Route: IM    Site: LUOQ gluteus    Exp Date: 09/30/2011    Lot #: [id number]    Mfr: novaplus    Comments: toradol 60mg  given    Patient tolerated injection without complications    Given by: Adella Hare LPN (May 25, 2010 3:03 PM)  Orders Added: 1)  Depo- Medrol 80mg  [J1040] 2)  Ketorolac-Toradol 15mg  [J1885] 3)  Admin of Therapeutic Inj  intramuscular or subcutaneous  [96372]  Medication administered with no adverse effects

## 2010-06-02 NOTE — Progress Notes (Signed)
Summary: blood type  Phone Note Call from Patient   Summary of Call: needs to know blood type. cell [id number] Initial call taken by: Rudene Anda,  June 24, 2009 8:41 AM  Follow-up for Phone Call        advised patient we have no record of this, bloodwork can be done but insurance not likely to pay Follow-up by: Adella Hare LPN,  June 24, 2009 9:41 AM

## 2010-06-02 NOTE — Miscellaneous (Signed)
Summary: NARC REFILL  Clinical Lists Changes  Medications: Rx of OXYCONTIN 40 MG XR12H-TAB (OXYCODONE HCL) Take 1 tablet by mouth three times a day;  #90 x 0;  Signed;  Entered by: Everitt Amber LPN;  Authorized by: Syliva Overman MD;  Method used: Handwritten    Prescriptions: OXYCONTIN 40 MG XR12H-TAB (OXYCODONE HCL) Take 1 tablet by mouth three times a day  #90 x 0   Entered by:   Everitt Amber LPN   Authorized by:   Syliva Overman MD   Signed by:   Everitt Amber LPN on 56/21/3086   Method used:   Handwritten   RxID:   [id number]

## 2010-06-02 NOTE — Medication Information (Signed)
Summary: Tax adviser   Imported By: Lind Guest 08/18/2009 13:21:31  _____________________________________________________________________  External Attachment:    Type:   Image     Comment:   External Document

## 2010-06-02 NOTE — Letter (Signed)
Summary: MEDICAL RELEASE  MEDICAL RELEASE   Imported By: Lind Guest 10/22/2009 10:32:21  _____________________________________________________________________  External Attachment:    Type:   Image     Comment:   External Document

## 2010-06-02 NOTE — Medication Information (Signed)
Summary: Tax adviser   Imported By: Lind Guest 05/04/2010 08:02:06  _____________________________________________________________________  External Attachment:    Type:   Image     Comment:   External Document

## 2010-06-02 NOTE — Progress Notes (Signed)
Summary: surgery  Phone Note Call from Patient   Summary of Call: had surgery monday and pt at home now. [phone] Initial call taken by: Rudene Anda,  July 22, 2009 11:11 AM  Follow-up for Phone Call        pls let her know I am ha[ppy that her surgery went well, and i hope that her recovery is also good Follow-up by: Syliva Overman MD,  July 22, 2009 12:18 PM  Additional Follow-up for Phone Call Additional follow up Details #1::        called pt and gave her docs message and she was thankful.  Additional Follow-up by: Rudene Anda,  July 22, 2009 2:38 PM

## 2010-06-02 NOTE — Progress Notes (Signed)
Summary: injection  Phone Note Call from Patient   Summary of Call: can she have a pain shot the shot she needs a driver with she has a driver call back at [phone] Initial call taken by: Lind Guest,  March 02, 2010 11:25 AM  Follow-up for Phone Call        nursevisit this afternioon for injection for pain ok pls let her know Follow-up by: Syliva Overman MD,  March 02, 2010 12:11 PM  Additional Follow-up for Phone Call Additional follow up Details #1::        Nursing pls administer with documentation of weightand bP depomedriol 80mg  Im and toradol 60 mg im x 1 dose of each, also document area of pain , and I dO not to review the record before it is signed off, nursing is NOT to sign off Additional Follow-up by: Syliva Overman MD,  March 02, 2010 12:13 PM    Additional Follow-up for Phone Call Additional follow up Details #2::    patient aware Follow-up by: Adella Hare LPN,  March 02, 2010 1:02 PM

## 2010-06-02 NOTE — Letter (Signed)
Summary: *Orthopedic No Show Letter  [facility]  [address]  [place], [place] [address]   Phone: [phone]  Fax: [phone]     06/10/2009   Ms. Turkey Donze [address] [place], [place]  [address]   Dear Ms. Majer,   Our records indicate that you missed your scheduled appointment with Dr. Beaulah Corin on 06/02/09.  Please contact this office to reschedule your appointment as soon as possible.  It is important that you keep your scheduled appointments with your physician, so we can provide you the best care possible.  We have enclosed an appointment card for your convenience.      Sincerely,    Dr. Terrance Mass, MD [facility] Phone [phone]

## 2010-06-02 NOTE — Medication Information (Signed)
Summary: Tax adviser   Imported By: Lind Guest 01/14/2010 14:02:50  _____________________________________________________________________  External Attachment:    Type:   Image     Comment:   External Document

## 2010-06-02 NOTE — Progress Notes (Signed)
  Phone Note Call from Patient   Summary of Call: Proventil aer HFA not covered by insurance. Ok to change to [employer] HFA? Initial call taken by: Everitt Amber LPN,  June 28, 2009 4:17 PM  Follow-up for Phone Call        [facility] to change Rx to ProAir HFA 2 puffs q 4 hrs as needed.   D/C Proventil HFA on med list. Follow-up by: Esperanza Sheets PA,  June 28, 2009 7:57 PM  Additional Follow-up for Phone Call Additional follow up Details #1::        When I called the pharmacy she was already on the Proair. The letter from the insurance must have been sent before the pharmacy asked if we would change it to preferred drug Additional Follow-up by: Everitt Amber LPN,  June 29, 2009 9:04 AM

## 2010-06-06 ENCOUNTER — Encounter: Payer: Self-pay | Admitting: Family Medicine

## 2010-06-07 ENCOUNTER — Encounter: Payer: Self-pay | Admitting: Family Medicine

## 2010-06-07 ENCOUNTER — Telehealth: Payer: Self-pay | Admitting: Family Medicine

## 2010-06-09 ENCOUNTER — Other Ambulatory Visit (HOSPITAL_COMMUNITY): Payer: Self-pay | Admitting: Cardiology

## 2010-06-14 ENCOUNTER — Ambulatory Visit (HOSPITAL_COMMUNITY)
Admission: RE | Admit: 2010-06-14 | Discharge: 2010-06-14 | Disposition: A | Discharge status: Home / Self Care | Payer: Medicare Other | Source: Ambulatory Visit | Attending: Cardiology | Admitting: Cardiology

## 2010-06-14 DIAGNOSIS — R079 Chest pain, unspecified: Secondary | ICD-10-CM

## 2010-06-14 DIAGNOSIS — I10 Essential (primary) hypertension: Secondary | ICD-10-CM | POA: Insufficient documentation

## 2010-06-14 DIAGNOSIS — R072 Precordial pain: Secondary | ICD-10-CM

## 2010-06-14 DIAGNOSIS — E119 Type 2 diabetes mellitus without complications: Secondary | ICD-10-CM | POA: Insufficient documentation

## 2010-06-14 DIAGNOSIS — R0789 Other chest pain: Secondary | ICD-10-CM | POA: Insufficient documentation

## 2010-06-16 NOTE — Medication Information (Signed)
Summary: Tax adviser   Imported By: Lind Guest 06/07/2010 14:59:02  _____________________________________________________________________  External Attachment:    Type:   Image     Comment:   External Document

## 2010-06-16 NOTE — Progress Notes (Signed)
  Phone Note Call from Patient   Summary of Call: Diovan HCT no longer covered by insurance. Alternatives are--Hyzaar (Losartan /HCTZ) Initial call taken by: Everitt Amber LPN,  June 07, 2010 4:29 PM  Follow-up for Phone Call        change entered historically pls notify the pt and the pharmacy Follow-up by: Syliva Overman MD,  June 07, 2010 4:38 PM  Additional Follow-up for Phone Call Additional follow up Details #1::        pharmacy aware Additional Follow-up by: Everitt Amber LPN,  June 07, 2010 4:44 PM    New/Updated Medications: LOSARTAN POTASSIUM-HCTZ 50-12.5 MG TABS (LOSARTAN POTASSIUM-HCTZ) Take 1 tablet by mouth once a day d/c diovan/hctz on completion of current prescription Prescriptions: LOSARTAN POTASSIUM-HCTZ 50-12.5 MG TABS (LOSARTAN POTASSIUM-HCTZ) Take 1 tablet by mouth once a day d/c diovan/hctz on completion of current prescription  #30 x 2   Entered by:   Everitt Amber LPN   Authorized by:   Syliva Overman MD   Signed by:   Everitt Amber LPN on 17/51/0258   Method used:   Printed then faxed to ...       [facility] [address] [phone]* (retail)       [address] [place]  [address]       Ph: [phone]       Fax: [phone]   RxID:   [id number]

## 2010-06-16 NOTE — Letter (Signed)
Summary: NON COVERAGE MEDICINE  NON COVERAGE MEDICINE   Imported By: Lind Guest 06/08/2010 11:20:23  _____________________________________________________________________  External Attachment:    Type:   Image     Comment:   External Document

## 2010-06-16 NOTE — Miscellaneous (Signed)
Summary: NARC REFILL  Clinical Lists Changes  Medications: Rx of OXYCONTIN 40 MG XR12H-TAB (OXYCODONE HCL) Take 1 tablet by mouth three times a day;  #90 x 0;  Signed;  Entered by: Everitt Amber LPN;  Authorized by: Syliva Overman MD;  Method used: Handwritten    Prescriptions: OXYCONTIN 40 MG XR12H-TAB (OXYCODONE HCL) Take 1 tablet by mouth three times a day  #90 x 0   Entered by:   Everitt Amber LPN   Authorized by:   Syliva Overman MD   Signed by:   Everitt Amber LPN on 16/01/9603   Method used:   Handwritten   RxID:   [id number]

## 2010-06-22 ENCOUNTER — Other Ambulatory Visit (HOSPITAL_COMMUNITY): Payer: Self-pay

## 2010-06-29 ENCOUNTER — Ambulatory Visit (HOSPITAL_COMMUNITY): Payer: Self-pay

## 2010-06-29 ENCOUNTER — Ambulatory Visit (HOSPITAL_COMMUNITY)
Admission: RE | Admit: 2010-06-29 | Discharge: 2010-06-29 | Disposition: A | Discharge status: Home / Self Care | Payer: Medicare Other | Source: Ambulatory Visit | Attending: Cardiology | Admitting: Cardiology

## 2010-06-30 ENCOUNTER — Encounter: Payer: Self-pay | Admitting: Family Medicine

## 2010-06-30 ENCOUNTER — Ambulatory Visit (INDEPENDENT_AMBULATORY_CARE_PROVIDER_SITE_OTHER): Payer: Medicare Other | Admitting: Family Medicine

## 2010-06-30 DIAGNOSIS — I10 Essential (primary) hypertension: Secondary | ICD-10-CM

## 2010-06-30 DIAGNOSIS — E669 Obesity, unspecified: Secondary | ICD-10-CM

## 2010-06-30 DIAGNOSIS — M546 Pain in thoracic spine: Secondary | ICD-10-CM

## 2010-06-30 DIAGNOSIS — F329 Major depressive disorder, single episode, unspecified: Secondary | ICD-10-CM

## 2010-07-05 ENCOUNTER — Encounter: Payer: Self-pay | Admitting: Family Medicine

## 2010-07-05 ENCOUNTER — Other Ambulatory Visit (HOSPITAL_COMMUNITY): Payer: Self-pay | Admitting: Cardiology

## 2010-07-12 NOTE — Assessment & Plan Note (Signed)
Summary: follow up 3 month/slj   Vital Signs:  Patient profile:   50 year old female Menstrual status:  hysterectomy Height:      65 inches Weight:      273.25 pounds BMI:     45.64 O2 Sat:      97 % on Room air Pulse rate:   76 / minute Pulse rhythm:   regular Resp:     16 per minute BP sitting:   140 / 90  (left arm)  Vitals Entered By: Adella Hare LPN (June 29, 8117 2:26 PM)  Nutrition Counseling: Patient's BMI is greater than 25 and therefore counseled on weight management options.  O2 Flow:  Room air CC: follow-up visit Is Patient Diabetic? Yes Comments did not bring meds but reviewed list   CC:  follow-up visit.  History of Present Illness: Bent over tying her shoes hurt her back, and now c/o back pain x 1 day, happened less than 2hours ago. she has otherwise been doing very well. She ha placed herself on significant calorie restriction with an impressive though a bit extremeamt of weight loss Denies recent fever or chills. Denies sinus pressure, nasal congestion , ear pain or sore throat. Denies chest congestion, or cough productive of sputum. Denies chest pain, palpitations, PND, orthopnea or leg swelling. Denies abdominal pain, nausea, vomitting, diarrhea or constipation. Denies change in bowel movements or bloody stool. Denies dysuria , frequency, incontinence or hesitancy.  Denies headaches, vertigo, seizures. Denies depression, anxiety or insomnia.controlled on meds Denies  rash, lesions, or itch.     Current Medications (verified): 1)  Gabapentin 800 Mg  Tabs (Gabapentin) .... One Tab By Mouth Qid 2)  Metformin Hcl 500 Mg Tabs (Metformin Hcl) .... Two Tabs By Mouth Every Morning and One Tab By Mouth Every Evening 3)  Cyclobenzaprine Hcl 10 Mg Tabs (Cyclobenzaprine Hcl) .... One Tab By Mouth Three Times A Day Prn 4)  Ambien 10 Mg Tabs (Zolpidem Tartrate) .... Take 1 Tab By Mouth At Bedtime 5)  Symbicort 160-4.5 Mcg/act Aero (Budesonide-Formoterol  Fumarate) .... 2 Puffs Twice Daily 6)  Singulair 10 Mg Tabs (Montelukast Sodium) .... Take 1 Tablet By Mouth Once A Day 7)  Oxycontin 40 Mg Xr12h-Tab (Oxycodone Hcl) .... Take 1 Tablet By Mouth Three Times A Day 8)  Klor-Con M20 20 Meq Cr-Tabs (Potassium Chloride Crys Cr) .... One Tab By Mouth Qd 9)  Flonase 50 Mcg/act Susp (Fluticasone Propionate) .... 2 Puffs Per Nostril Daily 10)  Nexium 40 Mg Cpdr (Esomeprazole Magnesium) .... Take 1 Capsule By Mouth Once A Day 11)  Proair Hfa 108 (90 Base) Mcg/act Aers (Albuterol Sulfate) .... Two Puffs By Mouth Every 6- 8 Hours Prn 12)  Diazepam 5 Mg Tabs (Diazepam) .... One Tab By Mouth Three Times A Day 13)  Proventil Hfa 108 (90 Base) Mcg/act Aers (Albuterol Sulfate) .... Two Puffs By Mouth Every 6 To 8 Hours As Needed 14)  Fluoxetine Hcl 20 Mg Caps (Fluoxetine Hcl) .... Take 1 Capsule By Mouth Once A Day 15)  Vitamin D (Ergocalciferol) 50000 Unit Caps (Ergocalciferol) .... One Capsule Once Weekly 16)  Lipitor 20 Mg Tabs (Atorvastatin Calcium) .... Take 1 Tab By Mouth At Bedtime 17)  Nitrolingual 0.4 Mg/spray Soln (Nitroglycerin) .... Use As Directed 18)  Losartan Potassium-Hctz 50-12.5 Mg Tabs (Losartan Potassium-Hctz) .... Take 1 Tablet By Mouth Once A Day D/c Diovan/hctz On Completion of Current Prescription  Allergies (verified): 1)  ! Asa 2)  ! Morphine  Review  of Systems      See HPI General:  Complains of fatigue. Eyes:  Denies blurring, discharge, eye pain, and red eye. MS:  Complains of joint pain, low back pain, mid back pain, and muscle weakness. Psych:  Complains of anxiety and depression; denies easily tearful, irritability, mental problems, suicidal thoughts/plans, thoughts of violence, and unusual visions or sounds; controlled on meds. Endo:  Denies excessive hunger, excessive thirst, and excessive urination; fasting sugars are seldom over 110. Heme:  Denies abnormal bruising and bleeding. Allergy:  Complains of seasonal allergies;  denies hives or rash, itching eyes, and sneezing.  Physical Exam  General:  Well-developed,obese,in no acute distress; alert,appropriate and cooperative throughout examination HEENT: No facial asymmetry,  EOMI,no sinus tenderness, TM's Clear, oropharynx  pink and moist.   Chest: decreased air entry, bilateral crackles and wheezes CVS: S1, S2, No murmurs, No S3.   Abd: Soft, Nontender.  MS: dereased  ROM spine,adequate in hips, shoulders and reduced in knees.  Ext: No edema.   CNS: CN 2-12 intact, power tone and sensation normal throughout.   Skin: Intact, no visible lesions or rashes.  Psych: Good eye contact, normal affect.  Memory intact, not anxious or depressed appearing.   Diabetes Management Exam:    Foot Exam (with socks and/or shoes not present):       Sensory-Monofilament:          Left foot: diminished          Right foot: diminished       Inspection:          Left foot: normal          Right foot: normal       Nails:          Left foot: normal          Right foot: normal   Impression & Recommendations:  Problem # 1:  OBESITY (ICD-278.00) Assessment Improved  Ht: 65 (06/30/2010)   Wt: 273.25 (06/30/2010)   BMI: 45.64 (06/30/2010)  Problem # 2:  DEPRESSION (ICD-311) Assessment: Improved  Her updated medication list for this problem includes:    Diazepam 5 Mg Tabs (Diazepam) ..... One tab by mouth three times a day    Fluoxetine Hcl 20 Mg Caps (Fluoxetine hcl) .Marland Kitchen... Take 1 capsule by mouth once a day  Problem # 3:  HYPERTENSION (ICD-401.9) Assessment: Improved  Her updated medication list for this problem includes:    Losartan Potassium-hctz 50-12.5 Mg Tabs (Losartan potassium-hctz) .Marland Kitchen... Take 1 tablet by mouth once a day d/c diovan/hctz on completion of current prescription  Orders: T-Basic Metabolic Panel [phone])  BP today: 140/90 Prior BP: 160/92 (05/25/2010)  Labs Reviewed: K+: 4.0 (05/09/2010) Creat: : 0.64 (05/09/2010)   Chol: 162  (05/09/2010)   HDL: 46 (05/09/2010)   LDL: 98 (05/09/2010)   TG: 92 (05/09/2010)  Problem # 4:  DIABETES MELLITUS, TYPE II (ICD-250.00) Assessment: Unchanged  Her updated medication list for this problem includes:    Metformin Hcl 500 Mg Tabs (Metformin hcl) .Marland Kitchen..Marland Kitchen Two tabs by mouth every morning and one tab by mouth every evening    Losartan Potassium-hctz 50-12.5 Mg Tabs (Losartan potassium-hctz) .Marland Kitchen... Take 1 tablet by mouth once a day d/c diovan/hctz on completion of current prescription  Orders: T- Hemoglobin A1C ([id number])  Labs Reviewed: Creat: 0.64 (05/09/2010)     Last Eye Exam: normal (12/08/2009) Reviewed HgBA1c results: 6.4 (05/09/2010)  6.5 (10/20/2009) Patient advised to reduce carbs and sweets, commit to regular physical activity,  take meds as prescribed, test blood sugars as directed, and attempt to lose weight , to improve blood sugar control.  Problem # 5:  BACK PAIN, THORACIC REGION (ICD-724.1) Assessment: Deteriorated  Her updated medication list for this problem includes:    Cyclobenzaprine Hcl 10 Mg Tabs (Cyclobenzaprine hcl) ..... One tab by mouth three times a day prn    Oxycontin 40 Mg Xr12h-tab (Oxycodone hcl) .Marland Kitchen... Take 1 tablet by mouth three times a day toradol  60mg  iM administered in office  Complete Medication List: 1)  Gabapentin 800 Mg Tabs (Gabapentin) .... One tab by mouth qid 2)  Metformin Hcl 500 Mg Tabs (Metformin hcl) .... Two tabs by mouth every morning and one tab by mouth every evening 3)  Cyclobenzaprine Hcl 10 Mg Tabs (Cyclobenzaprine hcl) .... One tab by mouth three times a day prn 4)  Ambien 10 Mg Tabs (Zolpidem tartrate) .... Take 1 tab by mouth at bedtime 5)  Symbicort 160-4.5 Mcg/act Aero (Budesonide-formoterol fumarate) .... 2 puffs twice daily 6)  Singulair 10 Mg Tabs (Montelukast sodium) .... Take 1 tablet by mouth once a day 7)  Oxycontin 40 Mg Xr12h-tab (Oxycodone hcl) .... Take 1 tablet by mouth three times a day 8)   Klor-con M20 20 Meq Cr-tabs (Potassium chloride crys cr) .... One tab by mouth qd 9)  Flonase 50 Mcg/act Susp (Fluticasone propionate) .... 2 puffs per nostril daily 10)  Nexium 40 Mg Cpdr (Esomeprazole magnesium) .... Take 1 capsule by mouth once a day 11)  Proair Hfa 108 (90 Base) Mcg/act Aers (Albuterol sulfate) .... Two puffs by mouth every 6- 8 hours prn 12)  Diazepam 5 Mg Tabs (Diazepam) .... One tab by mouth three times a day 13)  Proventil Hfa 108 (90 Base) Mcg/act Aers (Albuterol sulfate) .... Two puffs by mouth every 6 to 8 hours as needed 14)  Fluoxetine Hcl 20 Mg Caps (Fluoxetine hcl) .... Take 1 capsule by mouth once a day 15)  Vitamin D (ergocalciferol) 50000 Unit Caps (Ergocalciferol) .... One capsule once weekly 16)  Lipitor 20 Mg Tabs (Atorvastatin calcium) .... Take 1 tab by mouth at bedtime 17)  Nitrolingual 0.4 Mg/spray Soln (Nitroglycerin) .... Use as directed 18)  Losartan Potassium-hctz 50-12.5 Mg Tabs (Losartan potassium-hctz) .... Take 1 tablet by mouth once a day d/c diovan/hctz on completion of current prescription  Other Orders: T-Hepatic Function [id number]) T-Lipid Profile [phone]) T-TSH [id number]) Ketorolac-Toradol 15mg  (G2952) Admin of Therapeutic Inj  intramuscular or subcutaneous (84132)  Patient Instructions: 1)  Please schedule a follow-up appointment in 3.5 months. 2)  You need to lose weight. Consider a lower calorie diet and regular exercise. CONGRATS , keep it up. 3)  BMP prior to visit, ICD-9: 4)  Hepatic Panel prior to visit, ICD-9: 5)  Lipid Panel prior to visit, ICD-9:  n fasting in 3.5 months 6)  HbgA1C prior to visit, ICD-9: 7)  TSH prior to visit, ICD-9: 8)  [place] due in May pls schedule   Medication Administration  Injection # 1:    Medication: Ketorolac-Toradol 15mg     Diagnosis: BACK PAIN, THORACIC REGION (ICD-724.1)    Route: IM    Site: LUOQ gluteus    Exp Date: 09/30/2011    Lot #: [id number]    Mfr: [email or web address]     Comments: toradol 60mg  given    Patient tolerated injection without complications    Given by: Adella Hare LPN (June 30, 2534 3:28 PM)  Orders Added: 1)  Est. Patient Level IV [  99214] 2)  T-Basic Metabolic Panel [80048-22910] 3)  T-Hepatic Function [80076-22960] 4)  T-Lipid Profile [80061-22930] 5)  T- Hemoglobin A1C [83036-23375] 6)  T-TSH [16109-60454] 7)  Ketorolac-Toradol 15mg  [J1885] 8)  Admin of Therapeutic Inj  intramuscular or subcutaneous [96372]     Medication Administration  Injection # 1:    Medication: Ketorolac-Toradol 15mg     Diagnosis: BACK PAIN, THORACIC REGION (ICD-724.1)    Route: IM    Site: LUOQ gluteus    Exp Date: 09/30/2011    Lot #: [id number]    Mfr: [email or web address]    Comments: toradol 60mg  given    Patient tolerated injection without complications    Given by: Adella Hare LPN (June 29, 4780 3:28 PM)  Orders Added: 1)  Est. Patient Level IV [95621] 2)  T-Basic Metabolic Panel [30865-78469] 3)  T-Hepatic Function [80076-22960] 4)  T-Lipid Profile [80061-22930] 5)  T- Hemoglobin A1C [83036-23375] 6)  T-TSH [62952-84132] 7)  Ketorolac-Toradol 15mg  [J1885] 8)  Admin of Therapeutic Inj  intramuscular or subcutaneous [44010]

## 2010-07-12 NOTE — Miscellaneous (Signed)
Summary: NARC REFILL  Clinical Lists Changes  Medications: Rx of OXYCONTIN 40 MG XR12H-TAB (OXYCODONE HCL) Take 1 tablet by mouth three times a day;  #90 x 0;  Signed;  Entered by: Everitt Amber LPN;  Authorized by: Syliva Overman MD;  Method used: Handwritten    Prescriptions: OXYCONTIN 40 MG XR12H-TAB (OXYCODONE HCL) Take 1 tablet by mouth three times a day  #90 x 0   Entered by:   Everitt Amber LPN   Authorized by:   Syliva Overman MD   Signed by:   Everitt Amber LPN on 53/66/4403   Method used:   Handwritten   RxID:   [id number]

## 2010-07-27 ENCOUNTER — Other Ambulatory Visit (HOSPITAL_COMMUNITY): Payer: Medicare Other

## 2010-07-29 ENCOUNTER — Other Ambulatory Visit: Payer: Self-pay

## 2010-07-29 ENCOUNTER — Other Ambulatory Visit (HOSPITAL_COMMUNITY): Payer: Medicare Other

## 2010-07-29 MED ORDER — OXYCODONE HCL 40 MG PO TB12: 40.0000 mg | ORAL_TABLET | Freq: Three times a day (TID) | ORAL | Status: DC

## 2010-08-01 ENCOUNTER — Other Ambulatory Visit: Payer: Self-pay | Admitting: Family Medicine

## 2010-08-04 ENCOUNTER — Other Ambulatory Visit (HOSPITAL_COMMUNITY): Payer: Self-pay | Admitting: Cardiology

## 2010-08-05 LAB — CBC
HCT: 38.9 % (ref 36.0–46.0)
MCV: 83.9 fL (ref 78.0–100.0)
Platelets: 404 10*3/uL — ABNORMAL HIGH (ref 150–400)
RDW: 13.9 % (ref 11.5–15.5)

## 2010-08-05 LAB — DIFFERENTIAL
Basophils Absolute: 0 10*3/uL (ref 0.0–0.1)
Eosinophils Absolute: 0 10*3/uL (ref 0.0–0.7)
Eosinophils Relative: 1 % (ref 0–5)

## 2010-08-05 LAB — POCT CARDIAC MARKERS

## 2010-08-05 LAB — BASIC METABOLIC PANEL
BUN: 12 mg/dL (ref 6–23)
Chloride: 106 mEq/L (ref 96–112)
GFR calc non Af Amer: >60 mL/min (ref >60)
Glucose, Bld: 120 mg/dL — ABNORMAL HIGH (ref 70–99)
Potassium: 3 mEq/L — ABNORMAL LOW (ref 3.5–5.1)

## 2010-08-11 LAB — GLUCOSE, CAPILLARY
Glucose-Capillary: 114 mg/dL — ABNORMAL HIGH (ref 70–99)
Glucose-Capillary: 128 mg/dL — ABNORMAL HIGH (ref 70–99)
Glucose-Capillary: 129 mg/dL — ABNORMAL HIGH (ref 70–99)
Glucose-Capillary: 130 mg/dL — ABNORMAL HIGH (ref 70–99)
Glucose-Capillary: 142 mg/dL — ABNORMAL HIGH (ref 70–99)
Glucose-Capillary: 143 mg/dL — ABNORMAL HIGH (ref 70–99)
Glucose-Capillary: 158 mg/dL — ABNORMAL HIGH (ref 70–99)
Glucose-Capillary: 175 mg/dL — ABNORMAL HIGH (ref 70–99)
Glucose-Capillary: 183 mg/dL — ABNORMAL HIGH (ref 70–99)
Glucose-Capillary: 191 mg/dL — ABNORMAL HIGH (ref 70–99)

## 2010-08-11 LAB — BASIC METABOLIC PANEL
BUN: 10 mg/dL (ref 6–23)
BUN: 9 mg/dL (ref 6–23)
BUN: 9 mg/dL (ref 6–23)
CO2: 27 mEq/L (ref 19–32)
Calcium: 8.6 mg/dL (ref 8.4–10.5)
Chloride: 101 mEq/L (ref 96–112)
Chloride: 105 mEq/L (ref 96–112)
Chloride: 106 mEq/L (ref 96–112)
Creatinine, Ser: 0.66 mg/dL (ref 0.4–1.2)
Creatinine, Ser: 0.71 mg/dL (ref 0.4–1.2)
Creatinine, Ser: 0.72 mg/dL (ref 0.4–1.2)
GFR calc Af Amer: >60 mL/min (ref >60)
GFR calc non Af Amer: >60 mL/min (ref >60)
GFR calc non Af Amer: >60 mL/min (ref >60)
Glucose, Bld: 147 mg/dL — ABNORMAL HIGH (ref 70–99)
Glucose, Bld: 166 mg/dL — ABNORMAL HIGH (ref 70–99)
Potassium: 4 mEq/L (ref 3.5–5.1)
Potassium: 4.3 mEq/L (ref 3.5–5.1)
Sodium: 136 mEq/L (ref 135–145)
Sodium: 138 mEq/L (ref 135–145)
Sodium: 139 mEq/L (ref 135–145)

## 2010-08-11 LAB — BLOOD GAS, ARTERIAL
Acid-base deficit: 0 mmol/L (ref 0.0–2.0)
Bicarbonate: 21.8 mEq/L (ref 20.0–24.0)
Bicarbonate: 23.8 mEq/L (ref 20.0–24.0)
O2 Content: 2 L/min
O2 Content: 2 L/min
O2 Saturation: 95.5 %
Patient temperature: 37
pH, Arterial: 7.393 (ref 7.350–7.400)
pO2, Arterial: 84 mmHg (ref 80.0–100.0)

## 2010-08-11 LAB — URINALYSIS, ROUTINE W REFLEX MICROSCOPIC
Bilirubin Urine: NEGATIVE
Glucose, UA: NEGATIVE mg/dL
Ketones, ur: NEGATIVE mg/dL
Nitrite: NEGATIVE
Protein, ur: NEGATIVE mg/dL

## 2010-08-11 LAB — DIFFERENTIAL
Basophils Absolute: 0 10*3/uL (ref 0.0–0.1)
Basophils Relative: 1 % (ref 0–1)
Eosinophils Absolute: 0 10*3/uL (ref 0.0–0.7)
Eosinophils Absolute: 0 10*3/uL (ref 0.0–0.7)
Eosinophils Absolute: 0.2 10*3/uL (ref 0.0–0.7)
Eosinophils Relative: 0 % (ref 0–5)
Eosinophils Relative: 0 % (ref 0–5)
Eosinophils Relative: 0 % (ref 0–5)
Lymphocytes Relative: 19 % (ref 12–46)
Lymphocytes Relative: 8 % — ABNORMAL LOW (ref 12–46)
Lymphocytes Relative: 9 % — ABNORMAL LOW (ref 12–46)
Lymphs Abs: 0.7 10*3/uL (ref 0.7–4.0)
Lymphs Abs: 0.8 10*3/uL (ref 0.7–4.0)
Lymphs Abs: 0.9 10*3/uL (ref 0.7–4.0)
Monocytes Absolute: 0.1 10*3/uL (ref 0.1–1.0)
Monocytes Absolute: 0.2 10*3/uL (ref 0.1–1.0)
Monocytes Absolute: 0.5 10*3/uL (ref 0.1–1.0)
Monocytes Relative: 2 % — ABNORMAL LOW (ref 3–12)
Monocytes Relative: 2 % — ABNORMAL LOW (ref 3–12)
Monocytes Relative: 5 % (ref 3–12)
Monocytes Relative: 8 % (ref 3–12)
Neutro Abs: 7.7 10*3/uL (ref 1.7–7.7)
Neutro Abs: 9.1 10*3/uL — ABNORMAL HIGH (ref 1.7–7.7)
Neutrophils Relative %: 42 % — ABNORMAL LOW (ref 43–77)
Neutrophils Relative %: 90 % — ABNORMAL HIGH (ref 43–77)

## 2010-08-11 LAB — URINE CULTURE: Colony Count: 25000

## 2010-08-11 LAB — CBC
HCT: 37.9 % (ref 36.0–46.0)
HCT: 38 % (ref 36.0–46.0)
Hemoglobin: 11.6 g/dL — ABNORMAL LOW (ref 12.0–15.0)
Hemoglobin: 12.7 g/dL (ref 12.0–15.0)
Hemoglobin: 12.7 g/dL (ref 12.0–15.0)
MCHC: 33.4 g/dL (ref 30.0–36.0)
MCV: 83.2 fL (ref 78.0–100.0)
MCV: 83.8 fL (ref 78.0–100.0)
MCV: 84.5 fL (ref 78.0–100.0)
Platelets: 404 10*3/uL — ABNORMAL HIGH (ref 150–400)
Platelets: 428 10*3/uL — ABNORMAL HIGH (ref 150–400)
RBC: 4.18 MIL/uL (ref 3.87–5.11)
RBC: 4.51 MIL/uL (ref 3.87–5.11)
RBC: 4.52 MIL/uL (ref 3.87–5.11)
RDW: 14.4 % (ref 11.5–15.5)
RDW: 14.5 % (ref 11.5–15.5)
WBC: 10.1 10*3/uL (ref 4.0–10.5)
WBC: 10.6 10*3/uL — ABNORMAL HIGH (ref 4.0–10.5)
WBC: 3.6 10*3/uL — ABNORMAL LOW (ref 4.0–10.5)

## 2010-08-18 ENCOUNTER — Encounter: Payer: Self-pay | Admitting: Family Medicine

## 2010-08-19 ENCOUNTER — Encounter: Payer: Self-pay | Admitting: Family Medicine

## 2010-08-26 ENCOUNTER — Other Ambulatory Visit: Payer: Self-pay

## 2010-08-26 MED ORDER — OXYCODONE HCL 40 MG PO TB12: 40.0000 mg | ORAL_TABLET | Freq: Three times a day (TID) | ORAL | Status: DC

## 2010-08-28 ENCOUNTER — Other Ambulatory Visit: Payer: Self-pay | Admitting: Family Medicine

## 2010-08-31 ENCOUNTER — Other Ambulatory Visit: Payer: Self-pay | Admitting: Family Medicine

## 2010-09-02 ENCOUNTER — Ambulatory Visit (HOSPITAL_COMMUNITY): Payer: Medicare Other

## 2010-09-02 ENCOUNTER — Other Ambulatory Visit (HOSPITAL_COMMUNITY): Payer: Medicare Other

## 2010-09-02 ENCOUNTER — Ambulatory Visit (HOSPITAL_COMMUNITY)
Admission: RE | Admit: 2010-09-02 | Discharge: 2010-09-02 | Disposition: A | Discharge status: Home / Self Care | Payer: Medicare Other | Source: Ambulatory Visit | Attending: Cardiology | Admitting: Cardiology

## 2010-09-02 DIAGNOSIS — I10 Essential (primary) hypertension: Secondary | ICD-10-CM | POA: Insufficient documentation

## 2010-09-02 DIAGNOSIS — E785 Hyperlipidemia, unspecified: Secondary | ICD-10-CM | POA: Insufficient documentation

## 2010-09-02 DIAGNOSIS — I252 Old myocardial infarction: Secondary | ICD-10-CM | POA: Insufficient documentation

## 2010-09-02 DIAGNOSIS — I2510 Atherosclerotic heart disease of native coronary artery without angina pectoris: Secondary | ICD-10-CM | POA: Insufficient documentation

## 2010-09-07 ENCOUNTER — Other Ambulatory Visit: Payer: Self-pay | Admitting: Family Medicine

## 2010-09-07 DIAGNOSIS — Z139 Encounter for screening, unspecified: Secondary | ICD-10-CM

## 2010-09-13 NOTE — Group Therapy Note (Signed)
NAME:  Angela [name], Angela [name]              ACCOUNT NO.:  [id number]   MEDICAL RECORD NO.:  [id number]          PATIENT TYPE:  INP   LOCATION:  A336                          FACILITY:  [facility]   PHYSICIAN:  Dorris Singh, DO    DATE OF BIRTH:  [birth year]   DATE OF PROCEDURE:  07/02/2008  DATE OF DISCHARGE:                                 PROGRESS NOTE   SUBJECTIVE:  The patient states she feels a little bit better.  However,  she is still coughing and wheezing.   OBJECTIVE:  VITAL SIGNS:  Temperature is 98.6, pulse 94, respirations  16, blood pressure 129/85.  GENERAL:  The patient is an [place] female who is well-  developed, well-nourished and in no acute distress.  HEART:  Regular rhythm.  LUNGS:  Positive coarse breath sounds with wheezing throughout both lung  fields.  ABDOMEN:  Soft, nontender.  Nondistended.  EXTREMITIES:  Positive pulses.   DIAGNOSTIC STUDIES:  White count of 10.1, hemoglobin 11.6, hematocrit  35.3 and platelet count of 395,000.  Her sodium is 138, potassium is  3.4, chloride is 106, CO2 is 28, glucose 137, BUN is 8, creatinine 0.66.   ASSESSMENT/PLAN:  1. Acute exacerbation of chronic obstructive pulmonary disease.  Will      go ahead and continue with nebulizer treatments.  She seems to be      turning the corner and will continue with current therapy.  2. History of non-insulin dependent diabetes.  Will continue to cover      her with a sliding scale.  3. History of hypertension.  4. History of myocardial infarction.      Dorris Singh, DO  Electronically Signed     CB/MEDQ  D:  07/02/2008  T:  07/02/2008  Job:  [id number]

## 2010-09-13 NOTE — Discharge Summary (Signed)
NAME:  Angela [name], Angela [name]              ACCOUNT NO.:  [id number]   MEDICAL RECORD NO.:  [id number]          PATIENT TYPE:  INP   LOCATION:  A336                          FACILITY:  [facility]   PHYSICIAN:  Dorris Singh, DO    DATE OF BIRTH:  1960-10-16   DATE OF ADMISSION:  06/29/2008  DATE OF DISCHARGE:  03/07/2010LH                               DISCHARGE SUMMARY   ADMISSION DIAGNOSES:  1. Acute exacerbation of chronic obstructive pulmonary disease.  2. History a noninsulin diabetes.  3. History of hypertension.  4. History of myocardial infarction, coronary artery disease.   DISCHARGE DIAGNOSES:  1. Acute exacerbation of chronic obstructive pulmonary disease which      is resolved.  2. Noninsulin dependent diabetes.  3. Hypertension.  4. Myocardial infarction.   PRIMARY CARE PHYSICIAN:  Dr. Lodema Hong.   Testing that was done while she was here includes a one-view chest x-ray  that demonstrated no acute findings.   HOSPITAL COURSE:  The patient was admitted to the service of [facility].  She was started on DVT and GI prophylaxis.  She also was started on  nebulizer treatments, steroids, O2 and prophylactic antibiotics.  For  her noninsulin diabetes she was placed on the resistant sliding-scale to  check her blood sugar since she is on high-dose steroids.  For  hypertension, she was placed on her home medications and then for her  chronic back pain we placed her on oral pain medication.  She continued  to slowly progress without any incident, did not have any episodes of  hypoglycemia.  Every day she was a little bit better as far as her COPD,  less wheezing.  At this point in time today, it was determined that she  could be sent to home.   Her vitals are as follows:  Temperature 98.1, pulse 93, respirations 20,  blood pressure 160/90.  GENERAL:  The patient a is well-developed, well-nourished [place]  American female who is in no acute distress.  LUNGS:  Decreased breath sounds but  clear.  ABDOMEN:  Soft, nontender.  EXTREMITIES:  Positive pulses.  HEART:  Regular rate and rhythm.   LABS FOR TODAY:  A white count of 10.6, hemoglobin 12.1, hematocrit  38.0, platelet count 433.  Her abnormalities of her BMET, the only thing  that was abnormal was her CO2 was 34 and her glucose was 147.   The patient will be sent home on the following medications:  1. Advair 250/50.  2. Singulair 10 mg.  3. Levaquin 500 mg p.o. x5 more days.  4. She does have nebulizer solution.  She is recommended to use her      nebulizer four times daily and to follow up with Dr. Lodema Hong within      1-5 days.  5. She will be put on a Medrol Dosepak.   She will be sent home on her other medications which include:  1. Metformin 500 two tablets daily.  2. Gabapentin 800 mg four times daily.  3. Lipitor 10 mg at bedtime.  4. OxyContin 20 mg three times daily.  5. Flexeril 10  mg three times daily.  6. Rozerem 8 mg at bedtime.  7. Diovan/HCTZ 80/12.5 once a day.  8. Diazepam 5 mg three times daily.  9. Nitroglycerin sublingual 0.4 mg p.r.n.  10.Ipratropium/albuterol nebulizer treatments as needed.  11.Omeprazole 20 mg p.o. two times daily.  12.Tessalon Perles 100 mg three times daily.   We will discontinue her Biaxin.   Patient is instructed, as above, to increase her activity slowly, to  follow up with Dr. Lodema Hong in 1-5 days and to take her meds as  recommended.      Dorris Singh, DO  Electronically Signed     CB/MEDQ  D:  07/05/2008  T:  07/05/2008  Job:  [id number]

## 2010-09-13 NOTE — Op Note (Signed)
NAME:  TATIONNA, FULLARD              ACCOUNT NO.:  [id number]   MEDICAL RECORD NO.:  [id number]          PATIENT TYPE:  INP   LOCATION:  A202                          FACILITY:  [facility]   PHYSICIAN:  Dennie Maizes, M.D.   DATE OF BIRTH:  09/08/1960   DATE OF PROCEDURE:  04/24/2007  DATE OF DISCHARGE:                               OPERATIVE REPORT   PREOPERATIVE DIAGNOSIS:  Right upper ureteral calculus with obstruction,  right hydronephrosis, right renal colic.   POSTOPERATIVE DIAGNOSIS:  Right upper ureteral calculus with  obstruction, right hydronephrosis, right renal colic.   OPERATIVE PROCEDURE:  Cystoscopy, right retrograde pyelogram and right  ureteral stent placement.   ANESTHESIA:  General.   SURGEON:  Hollice Espy, M.D.   COMPLICATIONS:  None.   DRAINS:  A 6-French 26-cm size right ureteral stent.   INDICATIONS FOR PROCEDURE:  This 50 year old female has a history of  recurrent urolithiasis.  The patient was admitted to hospital with  severe right flank pain.  X-rays revealed a 13 x 9-mm size right upper  ureteral calculus with obstruction and hydronephrosis.  The patient was  taken to the operating room today for cystoscopy, right retrograde  pyelogram and right ureteral stent placement.  The obstructing stone  will later be treated with ESL an outpatient.   DESCRIPTION OF PROCEDURE:  General anesthesia was induced and the  patient was placed on the OR table in the dorsal lithotomy position.  The lower abdomen and genitalia were prepped and draped in a sterile  fashion.  Cystoscopy was done with the 32-French scope.  Appearance of  the bladder was normal.  Bilateral ureteral orifices and bladder mucosa  were unremarkable.   A 5-French ureteral catheter was then placed in the right ureteral  orifice, about 7 mL of  Renografin-60 was injected into the collecting  system and retrograde pyelogram was done using C-arm fluoroscopy.  Distal ureter was normal.  There  was a large filling defect in the upper  ureter with proximal hydroureter and hydronephrosis.   A 5-French open-ended catheter was then placed in the right ureter.  The  0.038-inch Bentson guidewire was inserted the open-ended catheter.  The  guidewire was passed in the right renal pelvis without much difficulty.  The open-  ended catheter was then removed.  A 6-French 26-cm size stent was then  inserted over the guidewire into the right collecting system.  The  instruments were removed.  The patient was transferred to the PACU in a  satisfactory condition.      Dennie Maizes, M.D.  Electronically Signed     SK/MEDQ  D:  04/24/2007  T:  04/24/2007  Job:  [id number]   cc:   Milus Mallick. Lodema Hong, M.D.  Fax: [phone]

## 2010-09-13 NOTE — H&P (Signed)
NAME:  Angela [name], Angela [name]              ACCOUNT NO.:  [id number]   MEDICAL RECORD NO.:  [id number]          PATIENT TYPE:  INP   LOCATION:  A336                          FACILITY:  [facility]   PHYSICIAN:  Skeet Latch, DO    DATE OF BIRTH:  1961/03/16   DATE OF ADMISSION:  06/29/2008  DATE OF DISCHARGE:  LH                              HISTORY & PHYSICAL   PRIMARY CARE PHYSICIAN:  Milus Mallick. Lodema Hong, M.D.   CHIEF COMPLAINT:  Shortness of breath and cough.   HISTORY OF PRESENT ILLNESS:  This is a 50 year old African American  female who has been complaining of shortness of breath and cough.  The  patient states she has had cold symptoms for the last few days with  running nose, cough and some wheezing.  She was seen by a primary care  physician, she was started on antibiotics as well as nebulizing  treatments.  The patient states that this did not improve her cough and  shortness of breath and stated yesterday her shortness of breath and  wheezing became worse.  She started getting some pleuritic chest  discomfort with her coughing.  The patient then presented to the  emergency room to be evaluated.   PAST MEDICAL HISTORY:  1. Non-insulin-dependent diabetes.  2. Hypertension.  3. Chronic back pain.  4. Myocardial infarction in the past  5. Recurrent urolithiasis.   PAST SURGICAL HISTORY:  1. Back surgery.  2. Cholecystectomy.  3. Right knee arthroscopic surgery.  4. Partial hysterectomy.  5. Right shoulder surgery.   SOCIAL HISTORY:  No history of alcohol, tobacco or illicit drug use.   ALLERGIES:  ASPIRIN, MORPHINE.   HOME MEDICATIONS:  1. Metformin 500 mg two tablets daily.  2. Gabapentin 800 mg q.i.d.  3. Lipitor 10 mg at bedtime.  4. OxyContin 20 mg t.i.d.  5. Flexeril 10 mg t.i.d.  6. Rozerem 8 mg at bedtime.  7. Diovan HCT 80/12.5 mg once a day.  8. Diazepam 5 mg t.i.d.  9. Nitroglycerin sublingual 0.4 mg p.r.n.  10.Ipratropium/albuterol nebulizer treatments as  needed.  11.Omeprazole 20 mg b.i.d.  12.Biaxin 500 mg b.i.d.  13.Tessalon Perles 100 mg t.i.d.   REVIEW OF SYSTEMS:  CARDIOVASCULAR:  No chest pain or palpitations.  LUNGS:  Positive for shortness of breath and cough.  GASTROINTESTINAL:  No abdominal pain or diarrhea.  GENITOURINARY:  No dysuria, urgency or  frequency.  MUSCULOSKELETAL:  No arthralgias.  Positive for back pain.  Other systems unremarkable.   PHYSICAL EXAMINATION:  VITAL SIGNS:  Temperature 97.7, pulse 83,  respirations 20, blood pressure 137/87, saturating 95% on room air.  GENERAL:  Well-nourished, well-developed, well-hydrated, no acute  distress.  HEENT:  Normocephalic, atraumatic.  Eyes PERRLA.  EOMI.  NECK:  Soft, supple, nontender, nondistended.  LUNGS:  Decreased breath sounds, no wheezing.  Poor expiratory effort,  but no rhonchi.  CARDIAC:  S1, S2 regular.  No murmurs, rubs or gallops.  ABDOMEN:  Obese, soft, nontender, nondistended.  Positive bowel sounds.  No rigidity or guarding.  NEUROLOGICAL:  Cranial nerves II-XII grossly intact.  Patient alert and  oriented x3.  EXTREMITIES:  No cyanosis, clubbing or edema.   LABORATORY DATA:  Sodium 137, potassium 3.7, chloride 101, CO2 27,  glucose 107, BUN 9, creatinine 0.71.  ABG:  A pH 7.429, PCO2 36.5, PO2  78.6, bicarbonate 23.8.  White count 4800, hemoglobin 13.6, hematocrit  40.7, platelets 428,000.  Chest x-ray:  No acute findings.   ASSESSMENT:  1. Acute asthma exacerbation/COPD.  2. History of non-insulin-dependent diabetes.  3. History of hypertension.  4. History of myocardial infarction/coronary artery disease.   PLAN:  1. Patient admitted to the service of [facility] and general medical      bed.  2. For acute asthma exacerbation/COPD, the patient will be placed on      IV steroids, oxygen and nebulizer treatments around the clock.  3. For her non-insulin-dependent diabetes, the patient will be placed      on sliding scale and placed on home  medications.  We will adjust      sliding scale based on her blood sugars.  Will check a.c. and h.s.      Her Lantus may need to be adjusted secondary to being on steroids.  4. For history of hypertension, the patient will also be placed on her      home medications.  5. For her chronic back pain, the patient will be placed on her home      oral pain medications via IV.  6. Will need DVT as well as GI prophylaxis.      Skeet Latch, DO  Electronically Signed     SM/MEDQ  D:  06/29/2008  T:  06/30/2008  Job:  [id number]   cc:   Milus Mallick. Lodema Hong, M.D.  Fax: [phone]

## 2010-09-13 NOTE — Op Note (Signed)
NAME:  Angela [name], Angela [name]              ACCOUNT NO.:  [id number]   MEDICAL RECORD NO.:  [id number]          PATIENT TYPE:  AMB   LOCATION:  DAY                           FACILITY:  [facility]   PHYSICIAN:  Kassie Mends, M.D.      DATE OF BIRTH:  1960-10-09   DATE OF PROCEDURE:  03/27/2007  DATE OF DISCHARGE:                               OPERATIVE REPORT   PROCEDURE:  Esophagogastroduodenoscopy with cold forceps biopsy.   INDICATION FOR EXAM:  Angela [name] is a 50 year old female who presents  with a history of abdominal pain.  In 1996 Angela [name] presented with abdominal  pain, nausea and vomiting and EGD revealed gastritis with superficial  gastric and duodenal ulcers.  In 2000 Angela [name] had a colonoscopy because of  rectal bleeding.  Angela [name] had internal hemorrhoids and a fissure.  In 2003,  Angela [name] again had rectal bleeding, heme-positive stools and a change in her  bowel habits.  A colonoscopy was performed in 2003 which showed internal  hemorrhoids.  In 2006 Angela [name] returned for rectal bleeding and heme-positive  stools and again a colonoscopy was performed.  It again showed internal  and external hemorrhoids.  Angela [name] also reports Angela [name] had a colonoscopy in  2007 which showed internal and external hemorrhoids.  This colonoscopy  was performed at [facility].  Angela [name] has  no family history of colon cancer or polyps.   Angela [name] states that Angela [name] has had increase in her abdominal pain and had a  Hemoccult which was positive.  Angela [name] regularly uses BC powders.  Angela [name]  denies any alcohol use, weight loss or problems swallowing.  Her last BC  powder was Sunday.   FINDINGS:  1. Diffuse erythema in the body and the antrum with occasional      erosion.  Biopsies obtained via cold forceps to evaluate for H.      pylori gastritis.  2. Small hiatal hernia.  3. Normal esophagus without evidence of Barrett's mass, stricture,      erosion or ulceration.  4. Normal duodenal bulb and second portion of the  duodenum.   DIAGNOSIS:  Gastritis likely NSAID induced, biopsies obtained to  evaluate for H. pylori gastritis.   RECOMMENDATIONS:  1. Angela [name] should avoid BC powder indefinitely.  2. Angela [name] should continue her Prilosec indefinitely.  3. Angela [name] should continue her Carafate until her visit with Lorenza Burton      in 2 months to reassess her abdominal pain.  4. Angela [name] should follow high fiber diet.  Angela [name] is given a handout on high-      fiber diet and gastritis.  Angela [name] should also avoid gastric irritants.  5. Screening colonoscopy in 10 years.  Would not perform another      colonoscopy unless Angela [name] manifests signs of anemia or weight loss.   MEDICATIONS:  1. Demerol 100 mg IV.  2. Versed 10 mg IV.   PROCEDURE TECHNIQUE:  Physical exam was performed.  Informed consent was  obtained from the patient after explaining benefits, risks and  alternatives to the procedure.  The patient was connected to the monitor  and placed in the left lateral position.  Continuous oxygen was provided  by nasal cannula and IV medicines administered through an indwelling  cannula.  After administration of sedation, the patient's esophagus was  intubated.  The scope was advanced under direct visualization to the  second portion of the duodenum.  The scope was removed slowly by  carefully examine the color, texture, anatomy and integrity mucosa on  the way out.  The patient was recovered in endoscopy and discharged home  in satisfactory condition.      Kassie Mends, M.D.  Electronically Signed     SM/MEDQ  D:  03/27/2007  T:  03/27/2007  Job:  [id number]   cc:   Milus Mallick. Lodema Hong, M.D.  Fax: [phone]

## 2010-09-13 NOTE — Cardiovascular Report (Signed)
NAME:  GYANNA, JAREMA              ACCOUNT NO.:  [id number]   MEDICAL RECORD NO.:  [id number]          PATIENT TYPE:  OIB   LOCATION:  1965                         FACILITY:  [facility]   PHYSICIAN:  Mohan N. Sharyn Lull, M.D. DATE OF BIRTH:  [birth year]   DATE OF PROCEDURE:  12/20/2006  DATE OF DISCHARGE:                            CARDIAC CATHETERIZATION   PROCEDURE:  Left cardiac catheterization with selective left and right  coronary angiography, left ventriculography via right groin using  Judkins technique.   INDICATIONS FOR PROCEDURE:  Ms. Altland is a 50 year old black female with  past medical history significant for hypertension, non-insulin-dependent  diabetes mellitus, degenerative joint disease, hypercholesteremia,  history of questionable MI in 1994, and diabetic neuropathy.  She was  referred by Dr. Shana Chute left catheterization and possible PCI.  The  patient complains of retrosternal chest pain off and on, associated with  nausea and diaphoresis radiating to the right arm, rate 10/10, relieved  with sublingual nitroglycerin.  The patient had Persantine Myoview on  October 15, 2006 which showed fixed perfusion defect in the anteroapical  wall with no definite ischemia, with EF of 65%, but due to recurrent  typical anginal chest pain and multiple risk factors and questionable  history of MI and fixed perfusion defect suggestive of anteroapical wall  MI in the past, the patient is referred for left catheterization and  possible PCI.  Discussed with the patient regarding left  catheterization, its risks and benefits, i.e., death, stroke, need for  emergency CABG, risk of restenosis, local vascular complications.  She  accepted and consented for the procedure.   PROCEDURE:  After obtaining informed consent, the patient was brought to  the catheterization lab and was placed on the fluoroscopy table.  The  right groin was prepped and draped in usual fashion.  2% Xylocaine was  used for  local anesthesia in the right groin.  With the help of thin-  wall needle, 4-French arterial sheath was placed.  The sheath was  aspirated and flushed.  Next, 4-French left Judkins catheter was  advanced over the wire under fluoroscopic guidance up to the ascending  aorta.  The wire was pulled out, and the catheter was aspirated and  connected to the manifold.  The catheter was further advanced and  engaged into left coronary ostium.  Multiple views of the left system  were taken.  Next, the catheter was disengaged and was pulled out over  the wire and was replaced with a 4-French 3-D right diagnostic catheter  which was advanced over the wire under fluoroscopic guidance up to the  ascending aorta.  The wire was pulled out.  The catheter was aspirated  and connected to the manifold.  The catheter was further advanced and  engaged into right coronary ostium.  Multiple views of the right system  were taken.  Next, the catheter was disengaged and was pulled out over  the wire and was replaced with a 4-French pigtail catheter which was  advanced over the wire under fluoroscopic guidance up to the ascending  aorta.  The wire was pulled out.  The catheter  was aspirated and  connected to the manifold.  The catheter was further advanced across the  aortic valve into the left ventricle.  Left ventricular pressures were  recorded.  Next, left ventriculography was done in 30-degree RAO  position.  Post-angiographic pressures were recorded from the left  ventricle and then pullback pressures were recorded from the aorta.  There was no gradient across aortic valve.  Next, the pigtail catheter  was pulled out over the wire.  The sheaths were aspirated and flushed.   FINDINGS:  Left ventriculography showed good left ventricular systolic  function, EF of 55-60%.  The left main was patent.  LAD has 10-15% mid  stenosis and 30-40% distal diffuse stenosis.  Diagonal one is small  which is patent.  Diagonal  2 is very small which is patent.  The left  circumflex is patent.  High OM-1 has 10-15%  midstenosis.  OM-2 is very, very small.  OM-3 and OM-4 are small which  are patent.  The RCA is patent.  PDA is small.  Posterolateral branches  are also very small which are patent.  The patient tolerated the  procedure well.  There were no complications.  The patient was  transferred to recovery room in stable condition.      Eduardo Osier. Sharyn Lull, M.D.  Electronically Signed     MNH/MEDQ  D:  12/20/2006  T:  12/21/2006  Job:  [id number]   cc:   Catheterization Lab  Osvaldo Shipper. Spruill, M.D.

## 2010-09-13 NOTE — Consult Note (Signed)
NAME:  Angela [name], Angela [name]              ACCOUNT NO.:  [id number]   MEDICAL RECORD NO.:  [id number]          PATIENT TYPE:  AMB   LOCATION:  DAY                           FACILITY:  [facility]   PHYSICIAN:  Kassie Mends, M.D.      DATE OF BIRTH:  Aug 02, 1960   DATE OF CONSULTATION:  DATE OF DISCHARGE:                                 CONSULTATION   PRIMARY CARE PHYSICIAN:  Dr. Lodema Hong.   REASON FOR CONSULTATION:  Hemoccult positive stools/epigastric pain.   HISTORY OF PRESENT ILLNESS:  Angela [name] is a 50 year old female.  She  describes an acute onset of severe epigastric pain that started 1 week  ago she said she was awakened at 7:00 a.m. with a severe cramp like  pain.  She then developed what she felt was vaginal bleeding.  She was  seen at [address] [facility] she later saw Dr. Despina Hidden.  She had a vaginal  exam which was reportedly normal.  She was found to be hemoccult  positive on rectal exam.  She herself has not noticed any rectal  bleeding or melena.  She did see him brownish red blood in her panties  again, which she attributed to being vaginal bleeding.  She was found to  have microscopic hematuria while in the emergency room.  She has  continued to have constant epigastric pain for the last week at worst it  is 10/10 on the pain scale.  She describes it as a severe indigestion.  She complains of nausea and vomited 6 days ago.  She does take Celebrex  200 mg t.i.d. for chronic back pain and myalgias.  She has had a  colonoscopy in 2007 at [facility]  which she reports as normal although I do not have the records she tells  me she was found to have internal and external hemorrhoids.  Her weight  has remained stable.   She was evaluated by Dr. Lodema Hong today.  She was given a prescription  for Levbid and Carafate, however, she is not begun this yet as she came  straight to our office.  She is also scheduled for a CT scan she  believes of the abdomen and  pelvis for today.   PAST MEDICAL AND SURGICAL HISTORY:  1. Hypertension.  2. Diabetes mellitus.  3. Chronic back pain.  4. She tells me she had an MI in August of 2008.  5. She had a colonoscopy at [facility] in 2007, which revealed internal and external hemorrhoids      per report.  6. She has a remote history of peptic ulcer disease.  7. She tells me her last EGD was by Dr. Katrinka Blazing in 2005 here in      [place].  8. She has had multiple back surgeries.  9. Right knee surgery.  10.Right shoulder surgery.  11.Hysterectomy.  12.Ectopic pregnancy.  13.Cholecystectomy in 1996.  14.She has had foot surgery.  15.Renal lithiasis.   CURRENT MEDICATIONS:  1. Metformin 500 mg b.i.d.  2. Gabapentin 800 mg q.i.d.  3. Lipitor 10 mg q.h.s.  4. OxyContin 20 mg t.i.d.  5. Cyclobenzaprine 10 mg t.i.d.  6. Rozerem 8 mg q.h.s.  7. Diovan 80/12.5 mg daily.  8. Diazepam 5 mg t.i.d.  9. Omeprazole 20 mg daily.  10.Celebrex 200 mg t.i.d.  11.Nitroglycerin.  12.She was seen today by Dr. Lodema Hong and giving Carafate 1 gram a.c.      and h.s. (which she has not started yet).  13.Levbid 0.375 mg b.i.d. (not started yet)   ALLERGIES:  ASPIRIN.   FAMILY HISTORY:  There is no known family history of carcinoma or  chronic GI problems.  Mother age 78 is healthy.  Father deceased at 21  he had a history of brain cancer, prostate cancer, diabetes mellitus.  She has seven healthy siblings.   SOCIAL HISTORY:  Angela [name] is single.  She has one healthy daughter.  She denies any tobacco, alcohol or drug use.  She is employed.   REVIEW OF SYSTEMS:  See HPI.   PHYSICAL EXAMINATION:  VITAL SIGNS:  Weight 270 pounds, height 64  inches, temperature 98 degrees, blood pressure 152/90 and pulse 88.  GENERAL:  Angela [name] is an obese [place] American female who is alert,  oriented, pleasant, cooperative in no acute distress.  HEENT.  Sclerae clear nonicteric, conjunctivae  pink.  Oropharynx pink  and moist without lesions.  NECK:  Supple without thyromegaly.  CHEST:  Heart regular rate and rhythm.  Normal S1-S2 no murmurs, clicks,  rubs or gallops.  LUNGS:  Clear to auscultation bilaterally.  ABDOMEN:  Protuberant with positive bowel sounds x4.  No bruits  auscultated.  Abdomen is quite tender in the epigastrium.  There is no  rebound tenderness or guarding.  No hepatosplenomegaly or mass and  limited given the patient's body habitus.  EXTREMITIES:  Without clubbing or edema bilaterally.   LABORATORY STUDIES:  Studies she had performed in the emergency room on  March 13, 2007 a CMP which showed a glucose of 103 was otherwise  normal.  She had a normal hemoglobin and hematocrit normal white blood  cell count platelet count of 491 urinalysis showed a large amount of  blood and protein and 7-10 RBCs. She had a noncontrast abdominal pelvic  CT October 08, 2006 for left-sided flank pain.  She was found to have stable  diffuse hepatic cysts, right upper pole kidney stone and new punctate  density lower pole left kidney without obstruction bilaterally.  She is  status post hysterectomy, and left oophorectomy.   IMPRESSION:  Angela [name] is a 50 year old female with a 1-week history of  acute onset epigastric pain found to be hemoccult positive on exam  through Dr. Forestine Chute office. She does have microscopic hematuria and noted  vaginal bleeding 1 week ago.  She is quite tender today on exam.  Etiology of her Hemoccult positive stool is unclear at this time.  I am  not certain that it is associated with epigastric pain.  Microscopic  hematuria is concerning she does have a history of renal lithiasis which  could be the culprit of her severe pain. She is scheduled for CT scan  tomorrow which should give more details.  We will request colonoscopy  report from [facility] at this was done last year.  If CT scan is negative,  she is going to need further workup to rule out  peptic ulcer disease.   PLAN:  1. Begin Carafate 1 gram a.c. and h.s. and Levbid 0.375 mg b.i.d.  prescriptions given by Dr. Lodema Hong.  2. CT of the abdomen and pelvis with contrast tomorrow and she is to      request the results be sent here as well.  3. She has a OxyContin at home for pain.  4. Continue omeprazole 20 mg daily.  5. She is going to need a urology consult given microscopic hematuria,      but will await CT report.  6. If CT does not reveal etiology of her pain, she is going to be set      up for EGD with Dr. Cira Servant in the near future.  I have discussed      this procedure including risks and benefits, to include but are not      limited to infection, perforation, drug reaction.  She agrees with      the plan and consent will be obtained.  7. Her Celebrex 200 mg t.i.d. may be placing her at risk for bleeding      and this dose may need to be reconsidered, but will await findings      and further workup.   Would like to thank Dr. Despina Hidden for allowing [place] to participate in the care  of Angela [name].      Lorenza Burton, N.P.      Kassie Mends, M.D.  Electronically Signed    KJ/MEDQ  D:  03/21/2007  T:  03/22/2007  Job:  [id number]   cc:   Lazaro Arms, M.D.  Fax: [phone]   Milus Mallick. Lodema Hong, M.D.  Fax: [phone]

## 2010-09-13 NOTE — H&P (Signed)
NAME:  Angela [name], Angela [name]              ACCOUNT NO.:  [id number]   MEDICAL RECORD NO.:  [id number]           PATIENT TYPE:  INP   LOCATION:  A202                          FACILITY:  [facility]   PHYSICIAN:  Dennie Maizes, M.D.   DATE OF BIRTH:  [birth year]   DATE OF ADMISSION:  04/23/2007  DATE OF DISCHARGE:  LH                              HISTORY & PHYSICAL   CHIEF COMPLAINT:  Severe right flank pain radiating to the front, right  ureteral calculus with obstruction.   HISTORY OF PRESENT ILLNESS:  This 50 year old female has a past history  of recurrent urolithiasis. She was referred to me by Dr. Syliva Overman.  The patient was evaluated for abdominal and pelvic pain in  November 2008.  Evaluation was done with CT of the abdomen and pelvis  with contrast material.  This revealed multiple cysts. She is status  post cholecystectomy but has a 10.9 x 5 mm upper ureteral stone with  obstruction and mild hydronephrosis.  The patient has been having  intermittent right flank pain for about 1 month.  She was seen in the  office with moderate pain.  X-ray of the area revealed 13 x 7 mm size  right upper ureteral calculus.  The patient has had severe intermittent  pain for about 3 days.  She was seen in the office, and she is admitted  to the hospital for pain control  and further treatment. She did not  have any fever, chills,  voiding difficulties, or gross hematuria  present.  She has a past history of urolithiasis.  She has undergone ESL  of right renal calculus in 2000.   The patient has urinary frequency x8-10, nocturia x0.  She has mild  urinary urgency and stress urinary incontinence.  There is no history of  other urinary incontinence.   PAST MEDICAL HISTORY:  1. Non-insulin-dependent diabetes mellitus.  2. Hypertension.  3. Chronic back pain status post back surgery.  4. Coronary artery disease status post MI.  5. History of recurrent urolithiasis status post ESL.  6. History of  ectopic pregnancy surgery in 1984.   MEDICATIONS:  1. Metformin 500 one p.o. b.i.d.  2. Gabapentin 800 mg 1 p.o. q. 6 h.  3. Lipitor 10 mg 1 p.o. nightly.  4. OxyContin 30 mg 1 p.o. 3 times a day.  5. Cyclobenzaprine 10 mg 1 p.o. 3 times a day for muscle spasm.  6. Rozerem 8 mg 1 p.o. nightly.  7. Diovan/hydrochlorothiazide 80/12.5 one  p.o. daily.  8. doxepin 5 mg p.o. 3 times a day.  9. Omeprazole 20 mg 1 p.o. daily.  10.Celebrex 200 mg 1 p.o. 3 times a day.  11.Nitroglycerin p.r.n. chest pain.   ALLERGIES:  None.   PHYSICAL EXAMINATION:  GENERAL:  The patient is in severe pain.  HEAD, EYES, EARS, NOSE, AND THROAT:  Normal.  NECK:  No masses.  LUNGS:  Clear to auscultation.  HEART:  Regular rate and rhythm.  No murmurs.  ABDOMEN:  Soft, no palpable flank mass.  Moderate right-sided  costovertebral angle tenderness is noted.  Bladder not  palpable.  No  suprapubic tenderness.   IMPRESSION:  Right upper ureteral calculus with obstruction with  impacted 13 x 7 mm right renal colic and right hydronephrosis.   PLAN:  1. Place the patient in observation.  2. IV fluids.  3. Narcotics with PCA morphine.  4. I discussed with the patient regarding management options.  She is      scheduled to undergo cystoscopy, right retrograde pyelogram and      right ureteral stent placement under anesthesia in the morning.  I      explained to the patient regarding the diagnosis, operative      details, alternative treatments, also possible risks and      complications, and she has agreed for the procedure to be done.      Obstructing stone will later be treated with ESL as an outpatient.      Dennie Maizes, M.D.  Electronically Signed     SK/MEDQ  D:  04/23/2007  T:  04/23/2007  Job:  [id number]   cc:   Milus Mallick. Lodema Hong, M.D.  Fax: [phone]   [facility]  Fax: [phone]

## 2010-09-16 NOTE — Op Note (Signed)
[facility]  Patient:    Angela [name], Angela [name]                       MRN: [id number] Proc. Date: 09/01/99 Adm. Date:  [id number] Disc. Date: [id number] Attending:  Lauree Chandler                           Operative Report  PREOPERATIVE DIAGNOSIS:  Distal left ureteral calculus.  POSTOPERATIVE DIAGNOSIS: Distal left ureteral calculus.  PROCEDURE:  Cystoscopy and left ureteroscopic stone extraction.  SURGEON:  Maretta Bees. Vonita Moss, M.D.  INDICATION FOR PROCEDURE:  This 50 year old black female had has symptoms of left flank pain and bladder pressure for almost a month. She initially presented to [facility] Emergency room on August 10, 1999 and a CT scan showed left hydronephrosis. An IVP yesterday showed a faint 3 to 4 mm stone in the distal left ureter with no obstruction but shadowing around the trigone. Since she has been symptomatic for so long she wanted intervention.  DESCRIPTION OF PROCEDURE:   The patient was brought to the operating room, placed in lithotomy position and external genitalia were prepped and draped in the usual fashion. She was cystoscoped and the bladder was unremarkable except for some bulging in the left intramural ureter. I could not see the stone bulging. I passed a guidewire and under fluoroscopy could see the stone adjacent to the guidewire. The ureteral orifice did not seem very big so I felt it best to do a balloon dilation with the four French balloon dilator which I did and under fluoroscopic control passed the balloon and inflated it just below the stone. The ureter was  dilated for three minutes under 10 atmospheres of pressure. With the guidewire n place, I then inserted the six [place] short rigid ureteroscope and encountered nd irregular golden yellow stone and with the Segura stone basket, I removed this stone intact and gave it to the patient postop. I then passed the ureteroscope p the distal left ureter and  saw no residual calculi and no evidence of any injury or problems and I felt it was reasonable to leave out double J catheter at this point. I felt reasonable enough that I needed to put in a double J catheter at this point so I just removed the guidewire and sent the patient to the recovery room in good condition having tolerated the procedure well. Double J catheter emptied the bladder and sent her to the recovery room in good condition. DD:  09/01/99 TD:  09/05/99 Job: [id number]

## 2010-09-16 NOTE — H&P (Signed)
NAME:  Angela [name], Angela [name]                          ACCOUNT NO.:  [id number]   MEDICAL RECORD NO.:  [id number]                  PATIENT TYPE:   LOCATION:                                       FACILITY:  [facility]   PHYSICIAN:  Jerolyn Shin C. Katrinka Blazing, M.D.                DATE OF BIRTH:   DATE OF ADMISSION:  DATE OF DISCHARGE:                                HISTORY & PHYSICAL   HISTORY OF PRESENT ILLNESS:  [age]-year-old female with history of  rectal bleeding for several months.  The bleeding started in July.  The  blood varies from pure red to dark.  Stool was black in color in September.  She has had some episodic crampy mid-abdominal pain.  She has had at least  five episodes of bleeding since January, with usually bleeding up to four  days in a row.  There is no history of rectal injury.  She has no nausea, no  decreased appetite or weight loss.  There is a strong family history of  colon cancer.  She has five relatives who died because of colon cancer.  Her  father died of colon cancer at age 27.  She had a colonoscopy in 1996 with  multiple polyps.  She has not had followup since that time.  The patient is  scheduled for colonoscopy.   PAST HISTORY:  She has hypertension, lumbar disk disease, right arm  radiculopathy and hypothyroidism.   MEDICATIONS:  1. Valium 5 mg t.i.d.  2. OxyContin 10 mg b.i.d.  3. Flexeril 10 mg q.i.d.  4. Ultram 50 mg q.i.d.  5. Synthroid -- dose unknown.  6. Neurontin 800 mg t.i.d.   PAST SURGICAL HISTORY:  Surgery includes hysterectomy.   PHYSICAL EXAMINATION:  VITAL SIGNS:  Blood pressure 132/86, pulse 76,  respirations 18.  Weight 229 pounds.  Height 5 feet 4 inches.  HEENT:  Unremarkable.  NECK:  Neck supple.  No JVD or bruit.  CHEST:  Chest clear to auscultation.  No rales, rubs, rhonchi or wheezes.  HEART:  Regular rate and rhythm without murmur, gallop or rub.  ABDOMEN:  Abdomen soft, nontender.  No masses.  RECTAL:  No masses or tenderness.   Stool is guaiac-positive.  EXTREMITIES:  Severe pain with touching her right arm, otherwise,  unremarkable.  NEUROLOGIC:  Exam unremarkable except for radiculopathy of right arm.   IMPRESSION:  1. Recurrent rectal bleeding.  2. Family history of colon cancer.  3.     Hypertension.  4. Hypothyroidism.   PLAN:  The patient will have colonoscopy.                                               Dirk Dress. Katrinka Blazing, M.D.    LCS/MEDQ  D:  03/11/2002  T:  03/12/2002  Job:  [id number]

## 2010-09-16 NOTE — Procedures (Signed)
NAME:  KIM, OKI              ACCOUNT NO.:  [id number]   MEDICAL RECORD NO.:  [id number]          PATIENT TYPE:  OUT   LOCATION:  RESP                          FACILITY:  [facility]   PHYSICIAN:  Edward L. Juanetta Gosling, M.D.DATE OF BIRTH:  [birth year]   DATE OF PROCEDURE:  05/28/2006  DATE OF DISCHARGE:  05/25/2006                            PULMONARY FUNCTION TEST   1. Spirometry shows normal values.  2. Lung volumes are normal.  3. DLCO is mildly reduced.  4. Arterial blood gases are normal.  5. There is no airflow obstruction on this tracing to correlate with      clinical diagnosis of asthma but it may simply be that she is well-      controlled.      Edward L. Juanetta Gosling, M.D.  Electronically Signed     ELH/MEDQ  D:  05/28/2006  T:  05/28/2006  Job:  [id number]   cc:   Milus Mallick. Lodema Hong, M.D.  Fax: [phone]

## 2010-09-16 NOTE — H&P (Signed)
NAME:  Angela [name], Angela [name]              ACCOUNT NO.:  [id number]   MEDICAL RECORD NO.:  [id number]          PATIENT TYPE:  AMB   LOCATION:  DAY                           FACILITY:  [facility]   PHYSICIAN:  Dennie Maizes, M.D.   DATE OF BIRTH:  08/17/1960   DATE OF ADMISSION:  08/31/2004  DATE OF DISCHARGE:  LH                                HISTORY & PHYSICAL   CHIEF COMPLAINT:  Persistent microhematuria.   HISTORY OF PRESENT ILLNESS:  This 50 year old female is referred to me by  Dr. Emelda Fear.  During a routine examination microhematuria was noted.  She  was treated with antibiotics, and she had persistent microhematuria after  antibiotic therapy.  Urine culture and sensitivity was negative.  The  patient was referred to me for further evaluation.  The patient denied  having any gross hematuria, voiding difficulty, fever, chills, or flank  pain.  She has urinary frequency time three to four and nocturia x 0.  She  has a past history of urolithiasis.  She has undergone lithotripsy  at  [facility] in 2001.   PAST MEDICAL HISTORY:  1.  History of hypertension.  2.  Degenerative joint disease, status post back surgeries x 3.  3.  Status post knee surgery.  4.  Status post partial hysterectomy.  5.  Status post shoulder surgery.  6.  History of hemorrhoids.   MEDICATIONS:  1.  Diovan/HCT 160/12.5 mg one p.o. every day.  2.  Fioricet one p.o. b.i.d.  3.  Valium 5 mg one p.o. t.i.d.  4.  Neurontin 200 mg one p.o. q.i.d.  5.  Ambien 10 mg one p.o. q.h.s.  6.  Potassium 30 mEq one p.o. every day.  7.  Cyclobenzaprine 10 mg p.o. t.i.d.  8.  OxyContin 10 mg one p.o. t.i.d.   She is allergic to ASPIRIN.   FAMILY HISTORY:  Positive for diabetes mellitus, heart disease, and  carcinoma of the colon.   PHYSICAL EXAMINATION:  HEENT:  Normal.  NECK:  No masses.  LUNGS:  Clear to auscultation.  HEART:  Regular rate and rhythm.  No murmur.  ABDOMEN:  Soft.  No palpable flank mass.  No CVA  tenderness.  Bladder not  palpable.  No suprapubic tenderness.   Urine cytology revealed no evidence of malignant cells.  Renal function is  normal.  BUN 14, creatinine 0.9.  A CT scan of the abdomen and pelvis  without and with contrast was done at [facility] which revealed a  small right renal calculus without obstruction.  There is no evidence of  hydronephrosis or ureteral calculi.  Benign hepatic cysts are also noted.   IMPRESSION:  1.  Microhematuria.  2.  Small right renal calculus.   PLAN:  Cystoscopy with monitored anesthesia care  in [facility].  I  have discussed with the patient regarding the significance of microhematuria  and the need for a full evaluation.  I have explained to her regarding the  operative details, alternate treatments, outcome, possible risks and  complications and she has agreed for the procedure to be done.  SK/MEDQ  D:  08/31/2004  T:  08/31/2004  Job:  [id number]   cc:   Tilda Burrow, M.D.  [address] [place]  [place] [address]  Fax: [phone]   [facility]  Fax: [phone]

## 2010-09-16 NOTE — H&P (Signed)
NAME:  Angela [name], Angela [name]              ACCOUNT NO.:  [id number]   MEDICAL RECORD NO.:  [id number]          PATIENT TYPE:  INP   LOCATION:  A329                          FACILITY:  [facility]   PHYSICIAN:  Marcello Moores, MD   DATE OF BIRTH:  25-Sep-1960   DATE OF ADMISSION:  04/26/2006  DATE OF DISCHARGE:  LH                              HISTORY & PHYSICAL   PRIMARY CARE PHYSICIAN:  Dr. Lodema Hong.   CHIEF COMPLAINT:  Difficult breathing for 1 week's duration.   HISTORY OF PRESENT ILLNESS:  She is a 50 year old female patient with  history of bronchial asthma, diabetes mellitus, hypertension and high  cholesterol. History of back surgery x3 for  disk prolapse. She came with intermittent difficulty breathing for the  last week. She visited the ER but was sent home with medication with no  improvement. Today she was sent by Dr. Jen Mow for direct admission. The  patient denied feveor chest pain. but has a mild dry cough. She has no  abdominal pain. She has no urinary complaints.   PAST MEDICAL HISTORY:  1. Back surgery x3 for disk prolapse.  2. History of hypertension which is controlled by medication.  3. Diabetes mellitus on Metformin.  4. High cholesterol on Lipitor.   SOCIAL HISTORY:  She is single, has one child. There is no history of  smoking. No history of alcohol. No use of illicit drugs.   FAMILY HISTORY:  Mother is healthy, alive. Father had diabetes and  hypertension.   ALLERGIES:  No known drug allergies.   REVIEW OF SYSTEMS:  A 10-point review of systems is noncontributory  except as noted in the history of present illness.   HOME MEDICATIONS:  1. Metformin 500 mg p.o. b.i.d.  2. Gabapentin 800 mg x4 daily.  3. OxyContin 10 mg p.o. t.i.d.  4. Darvon.  5. Diazepam.  6. Prednisone 20 mg daily.   LABORATORY DATA:  The latest CBC reveals a white blood cell count of 9.8  thousand, hemoglobin is 12, hematocrit is 37, platelets 487,000.  Chemistries - blood glucose is 127,  sodium is 139, potassium is 3.3,  chloride is 101, bicarb is 28, BUN is 14, creatinine 0.7. Liver function  tests within normal range.   Chest x-ray: Mild peribronchial thickening, otherwise there is no other  abnormality.   ASSESSMENT AND PLAN:  1. Acute bronchial asthma. She was put on Solu-Medrol IV, on Zithromax      IV. Respiratory treatment every 4 hours.  2. Hypertension. Continue with her home medications.  3. Diabetes. Continue with her Metformin. Do chemistry and CBC      tomorrow morning. and management will continue based on clinical      response.      Marcello Moores, MD  Electronically Signed     MT/MEDQ  D:  04/26/2006  T:  04/26/2006  Job:  [id number]   cc:   Milus Mallick. Lodema Hong, M.D.  Fax: [phone]   Valla Leaver, Ph.D.

## 2010-09-16 NOTE — H&P (Signed)
NAME:  Angela [name], Angela [name]              ACCOUNT NO.:  [id number]   MEDICAL RECORD NO.:  [id number]          PATIENT TYPE:  AMB   LOCATION:  DAY                           FACILITY:  [facility]   PHYSICIAN:  Jerolyn Shin C. Katrinka Blazing, M.D.   DATE OF BIRTH:  February 02, 1961   DATE OF ADMISSION:  DATE OF DISCHARGE:  LH                                HISTORY & PHYSICAL   A 50 year old female referred for a screening colonoscopy for evaluation of  rectal bleeding.  The patient has had rectal bleeding off and on for the  past year.  She had an episode of rectal bleeding, in 2003, where  colonoscopy was non-revealing.   PAST HISTORY:  1.  She has hypertension.  2.  Benign hepatic cyst.  3.  Depression.  4.  Hypothyroidism.   MEDICATIONS:  1.  Diovan/HCT 160/12.5 every day.  2.  Fioricet b.i.d.  3.  Valium 5 mg t.i.d.  4.  Neurontin 800 mg q.i.d.  5.  Ambien 10 mg q.h.s.  6.  KCl 20 mEq every day.  7.  Flexeril 10 mg t.i.d.  8.  OxyContin 10 mg daily.   SURGERY:  1.  Right carpal tunnel release.  2.  Total abdominal hysterectomy.   PHYSICAL EXAMINATION:  VITAL SIGNS:  Blood pressure 140/80, pulse 72,  respirations 20, weight 256 pounds.  HEENT:  Unremarkable.  NECK:  Supple.  No JVD or bruit.  CHEST:  Clear to auscultation.  HEART:  Regular rate and rhythm without murmur, gallop or rub.  ABDOMEN:  Soft, nontender.  No masses.  EXTREMITIES:  No cyanosis, clubbing, or edema.  NEUROLOGIC:  No focal motor, sensory, or cerebellar deficit.   IMPRESSION:  1.  Recurrent rectal bleeding.  2.  Hypertension.  3.  Depression.  4.  Chronic hepatic cyst.  5.  Hypothyroidism.   PLAN:  Colonoscopy.      LCS/MEDQ  D:  06/01/2004  T:  06/01/2004  Job:  [id number]

## 2010-09-16 NOTE — Op Note (Signed)
NAME:  Angela [name], Angela [name]              ACCOUNT NO.:  [id number]   MEDICAL RECORD NO.:  [id number]          PATIENT TYPE:  AMB   LOCATION:  DAY                           FACILITY:  [facility]   PHYSICIAN:  Denny Peon. Ulice Brilliant, D.P.M.  DATE OF BIRTH:  1960-10-30   DATE OF PROCEDURE:  06/20/2006  DATE OF DISCHARGE:                               OPERATIVE REPORT   PREOPERATIVE DIAGNOSIS:  Hammer toe deformity, fourth toe, left foot,  hammer toe deformity, fifth toe, left foot.   POSTOPERATIVE DIAGNOSIS:  Hammer toe deformity, fourth toe, left foot,  hammer toe deformity, fifth toe, left foot.   PROCEDURE PERFORMED:  1. Arthroplasty with 0.045 K-wire fixation fourth toe, left foot.  2. Arthroplasty, fifth toe, left foot.   SURGEON:  Denny Peon. Ulice Brilliant, D.P.M.   ANESTHESIA:  MAC.   INDICATIONS FOR PROCEDURE:  Long-standing painful fourth and fifth toes  of the left foot.  The patient's specific complaints are of a painful  lesion on the medial aspect of the fifth toe and on the lateral aspect  of the fourth toe where the two toes abut each other.  The patient is  noted clinically to have adductor varus hammer toe deformities of the  fourth and fifth toes.  Radiographs bear out the clinical description.  The patient has gotten to the point where all shoes are uncomfortable,  weight-bearing is uncomfortable with the toes abutting and pressing  against each other.  Angela [name] has requested surgical correction.   DESCRIPTION OF PROCEDURE:  Ms. Kingsberry is brought into the OR and placed  on the table in the supine position.  IV sedation is established.  A  digital block was performed about the fourth and fifth toes of her right  foot.  A pneumatic ankle tourniquet was then applied to her right ankle.  Her foot is prepped and draped in the usual aseptic fashion.  An Ace  bandage is utilized to exsanguinate her foot and the tourniquet is  inflated to 250 mmHg.   PROCEDURE 1 ARTHROPLASTY FIFTH TOE LEFT FOOT:   Attention was directed to  the fifth digit of the left foot.  The lesion noted on the medial aspect  of the fifth toe is appreciated.  A 1 cm stab incision is made just  distal to this lesion.  The incision is deepened via sharp dissection  down to the medial aspect of the distal phalanx.  The hypertrophic  exostosis here is appreciated.  With both the blade and with the [facility], this is then resected utilizing the pneumatic rasp.  This  incision is flushed and then closed with 4-0 Prolene.  Attention is then  directed to the dorsal lateral aspect of the fifth toe.   Two semi-elliptical curvilinear skin incisions are planned utilizing a  skin marking pen with the orientation being from dorsal distal medial to  plantar lateral proximal.  The skin incisions are then made and the  resulting elliptical wedge of skin is excised.  The skin edges are  undermined.  The proximal interphalangeal joint is then entered by a  dorsal transverse  incision.  The medial and lateral collateral ligaments  are isolated and severed.  The proximal phalangeal head is delivered  into the surgical wound and excised utilizing a #60 blade on the  oscillating saw.  The remaining stump of the proximal phalanx is  appreciated.  There is a little spicule noted medially which is excised  with the bone rongeur.  Redundant extensor tendon is excised.  The toe  is held rectus.  The skin margins are then reapproximated and closed  with 4-0 Prolene in a combination of simple interrupted and horizontal  mattress sutures.   PROCEDURE 2 ARTHROPLASTY FOURTH TOE LEFT FOOT:  Attention was then  directed to the fourth digit.  A 2.5 cm dorsal linear skin incision was  made.  The incision was deepened via sharp dissection in a longitudinal  fashion from the mid shaft to the intermediate phalanx crossing over the  proximal interphalangeal joint and then to the mid shaft of the proximal  phalanx.  The incision is deepened via  sharp and blunt dissection.  The  skin edges are undermined.  The extensor tendon is entered via dorsal  transverse incision.  The proximal interphalangeal joint is then  entered.  The medial and lateral collateral ligaments are isolated and  severed.  The extensor tendon is retracted proximally.  The head of the  proximal phalanx was then excised utilizing the #60 blade on the  oscillating saw.  Attention was then directed to the lateral aspect of  the intermediate phalanx.  A lateral hemiphalangectomy is performed  utilizing the single action bone forceps.  This newly formed osseous  surface is then rasped smooth.  A K-wire is then introduced and utilized  to act as fixation.  The K-wire is inserted through the base of the  intermediate phalanx and driven through the distal aspect of the toe.  The toe is then held rectus and the K-wire is retrograded back across  the proximal phalanx to the base of the proximal phalanx, but not  entering or passing through the MTP.  This is visualized on the scan.  The toe is noted to be rectus in all three planes with no deviation.  The wound is flushed.  The K-wire is bent close to the distal aspect of  the toe and cut.  A cap is applied to this.  4-0 Prolene is utilized to  close the skin in a combination of simple, interrupted, and horizontal  mattress sutures.  A postoperative injection of Marcaine and Hexadrol is  dispensed to the toes.  A Betadine soaked Adaptic dressing and a dry  sterile compressive dressing follows.   Angela [name] tolerates the anesthesia and procedure well.  Angela [name] is  transported to day hospital without incident.  There are a list of  written instructions I will explain to her, prescription for Lorcet Plus  and Phenergan is dispensed.  Angela [name] will be seen in seven postop days for  her first postop visit in the [place] office.           ______________________________ Denny Peon. Ulice Brilliant, D.P.M.     CMD/MEDQ  D:  06/21/2006  T:   06/21/2006  Job:  [id number]

## 2010-09-16 NOTE — Discharge Summary (Signed)
NAME:  Angela [name], Angela [name]              ACCOUNT NO.:  [id number]   MEDICAL RECORD NO.:  [id number]          PATIENT TYPE:  INP   LOCATION:  A329                          FACILITY:  [facility]   PHYSICIAN:  Margaretmary Dys, M.D.DATE OF BIRTH:  10/06/1960   DATE OF ADMISSION:  04/26/2006  DATE OF DISCHARGE:  12/30/2007LH                               DISCHARGE SUMMARY   DISCHARGE DIAGNOSES:  1. Acute asthma exacerbation.  2. History of type 2 diabetes mellitus.  3. Morbid obesity.   DISCHARGE MEDICATIONS:  1. Medrol pack for 14 days, as per pharmacy.  2. Metformin 500 mg p.o. b.i.d.  3. Gabapentin 800 mg p.o. q.i.d.  4. Lipitor 10 mg p.o. q.h.s.  5. OxyContin 10 mg p.o. t.i.d.  6. Cyclobenzaprine 10 mg p.o. t.i.d.  7. Rozerem 8 mg p.o. once a day.  8. Diovan 80/12.5 mg p.o. once a day.  9. Diazepam 5 mg p.o. t.i.d.  10.Benzonatate 20 mg p.o. t.i.d.  11.Prednisone 20 mg p.o. once a day.  12.Cheratussin AC 80 mg p.o. q.6 h  p.r.n. for cough.   DISCHARGE DIET:  Under ADA diabetic diet.  Low salt.   DISCHARGE ACTIVITIES:  As tolerated.   FOLLOWUP:  She is to follow up with Milus Mallick. Simpson, M.D. in about 2-  3 weeks or sooner if needed.   SPECIAL INSTRUCTIONS:  She is advised to return to the emergency room if  her shortness of breath with wheezing worsens.   HOSPITAL COURSE:  The patient is a 50 year old female with history of  asthma, diabetes, hypertension and high cholesterol; as well as back  surgery x3 for disk prolapse.  The patient has had intermittent  difficulty with breathing and shortness of breath over the past week.  She was subsequently sent over for direct admission.  The patient denied  any chest pain, but has had a mild dry cough. She has no abdominal pain  and no urinary symptoms.   Evaluation revealed that she had diffuse bilateral rhonchi on admission.  (Kindly review Dr. Betsey Amen H&P for details).   The patient was subsequently started on IV SoluMedrol,  Zithromax and  nebulizer treatments every 4 hours, with subsequent significant  improvement in her overall symptoms.  The patient did not have any  complication throughout her hospitalization.   I saw her on April 29, 2006. At the time she was doing very well.  She had less wheezing and much less cough.  She was subsequently  discharged to home in satisfactory condition; to finish 14 days of  prednisone.   PERTINENT LABORATORY DATA ON ADMISSION:  White blood cell count 9.8,  hemoglobin 12, hematocrit 37, platelet count 47,000.  Glucose 127,  sodium 139, potassium 3.3, chloride 101, bicarb 28, BUN 14, creatinine  0.7.  Liver function tests were normal.   Chest x-ray shows mild peribronchial thickening; otherwise no acute  abnormalities.      Margaretmary Dys, M.D.  Electronically Signed     AM/MEDQ  D:  06/10/2006  T:  06/10/2006  Job:  [id number]   cc:   Milus Mallick. Lodema Hong, M.D.  Fax:  [phone]   Erle Crocker, M.D.

## 2010-09-16 NOTE — Op Note (Signed)
NAME:  DIERRA, RIESGO              ACCOUNT NO.:  [id number]   MEDICAL RECORD NO.:  [id number]          PATIENT TYPE:  AMB   LOCATION:  DAY                           FACILITY:  [facility]   PHYSICIAN:  Dennie Maizes, M.D.   DATE OF BIRTH:  1960-12-11   DATE OF PROCEDURE:  08/31/2004  DATE OF DISCHARGE:                                 OPERATIVE REPORT   PREOPERATIVE DIAGNOSIS:  Persistent microhematuria.   POSTOPERATIVE DIAGNOSIS:  Persistent microhematuria, normal bladder.   OPERATIVE PROCEDURE:  Cystoscopy.   ANESTHESIA:  Monitored anesthesia care.   SURGEON:  Dennie Maizes, M.D.   COMPLICATIONS:  None.   INDICATIONS FOR THE PROCEDURE:  This 50 year old female with persistent  asymptomatic microhematuria has undergone complete evaluation.  A CT scan  was negative for significant GU lesions.  Urine cytology was negative.  She  was brought to the [facility] today for cystoscopy to rule out  bladder lesions.   DESCRIPTION OF PROCEDURE:  Under monitored anesthesia care, the patient was  placed on the cystoscopy table in the dorsolithotomy position.  The lower  abdomen and genitalia were prepped and draped in a sterile fashion.  Cystoscopy was done with a 17 [place] scope.  The bladder neck, trigone,  ureteral orifices and bladder mucosa were normal.  No abnormality was noted  in the bladder.  The bladder capacity is adequate.  The instruments were  removed.  The patient was transferred to the PACU in a satisfactory  condition.      SK/MEDQ  D:  08/31/2004  T:  08/31/2004  Job:  [id number]   cc:   Tilda Burrow, M.D.  [address] [place]  [place] [address]  Fax: [phone]   Milus Mallick. Lodema Hong, M.D.  [address]  [place], [place] [address]  Fax: [phone]

## 2010-09-23 ENCOUNTER — Other Ambulatory Visit: Payer: Self-pay

## 2010-09-23 MED ORDER — OXYCODONE HCL 40 MG PO TB12: 40.0000 mg | ORAL_TABLET | Freq: Three times a day (TID) | ORAL | Status: DC

## 2010-09-27 ENCOUNTER — Other Ambulatory Visit: Payer: Self-pay | Admitting: Family Medicine

## 2010-09-27 ENCOUNTER — Ambulatory Visit (HOSPITAL_COMMUNITY)
Admission: RE | Admit: 2010-09-27 | Discharge: 2010-09-27 | Disposition: A | Discharge status: Home / Self Care | Payer: Medicare Other | Source: Ambulatory Visit | Attending: Family Medicine | Admitting: Family Medicine

## 2010-09-27 DIAGNOSIS — Z139 Encounter for screening, unspecified: Secondary | ICD-10-CM

## 2010-09-27 DIAGNOSIS — Z1231 Encounter for screening mammogram for malignant neoplasm of breast: Secondary | ICD-10-CM | POA: Insufficient documentation

## 2010-09-30 ENCOUNTER — Other Ambulatory Visit: Payer: Self-pay | Admitting: Family Medicine

## 2010-10-03 ENCOUNTER — Other Ambulatory Visit: Payer: Self-pay

## 2010-10-03 MED ORDER — POTASSIUM CHLORIDE CRYS ER 20 MEQ PO TBCR: EXTENDED_RELEASE_TABLET | ORAL | Status: DC

## 2010-10-04 ENCOUNTER — Ambulatory Visit: Payer: Medicare Other | Admitting: Family Medicine

## 2010-10-04 HISTORY — PX: KNEE ARTHROSCOPY: SUR90

## 2010-10-12 ENCOUNTER — Encounter: Payer: Self-pay | Admitting: Family Medicine

## 2010-10-14 ENCOUNTER — Encounter: Payer: Self-pay | Admitting: Family Medicine

## 2010-10-17 ENCOUNTER — Encounter: Payer: Self-pay | Admitting: Family Medicine

## 2010-10-18 ENCOUNTER — Encounter: Payer: Self-pay | Admitting: Family Medicine

## 2010-10-18 ENCOUNTER — Ambulatory Visit (INDEPENDENT_AMBULATORY_CARE_PROVIDER_SITE_OTHER): Payer: Medicare Other | Admitting: Family Medicine

## 2010-10-18 VITALS — BP 160/100 | HR 93 | Resp 16 | Ht 65.5 in | Wt 285.1 lb

## 2010-10-18 DIAGNOSIS — R5383 Other fatigue: Secondary | ICD-10-CM

## 2010-10-18 DIAGNOSIS — I10 Essential (primary) hypertension: Secondary | ICD-10-CM

## 2010-10-18 DIAGNOSIS — R5381 Other malaise: Secondary | ICD-10-CM

## 2010-10-18 DIAGNOSIS — E119 Type 2 diabetes mellitus without complications: Secondary | ICD-10-CM

## 2010-10-18 DIAGNOSIS — E559 Vitamin D deficiency, unspecified: Secondary | ICD-10-CM

## 2010-10-18 DIAGNOSIS — F411 Generalized anxiety disorder: Secondary | ICD-10-CM

## 2010-10-18 DIAGNOSIS — K625 Hemorrhage of anus and rectum: Secondary | ICD-10-CM

## 2010-10-18 DIAGNOSIS — M549 Dorsalgia, unspecified: Secondary | ICD-10-CM

## 2010-10-18 DIAGNOSIS — E785 Hyperlipidemia, unspecified: Secondary | ICD-10-CM

## 2010-10-18 MED ORDER — AMLODIPINE BESYLATE 5 MG PO TABS: 5.0000 mg | ORAL_TABLET | Freq: Every day | ORAL | Status: DC

## 2010-10-18 NOTE — Patient Instructions (Addendum)
F/u in 6 to 8 weeks  Fasting labs asap.  A healthy diet is rich in fruit, vegetables and whole grains. Poultry fish, nuts and beans are a healthy choice for protein rather then red meat. A low sodium diet and drinking 64 ounces of water daily is generally recommended. Oils and sweet should be limited. Carbohydrates especially for those who are diabetic or overweight, should be limited to 34-45 gram per meal. It is important to eat on a regular schedule, at least 3 times daily. Snacks should be primarily fruits, vegetables or nuts. Goal is 7 pouunds    pls schedule appt for eye exam , it is due..  You  will be referred to Dr Darrick Penna and Dr Gerilyn Pilgrim  Pls take stool softener twice daily

## 2010-10-18 NOTE — Progress Notes (Signed)
  Subjective:    [name], female    DOB: [birth year], 50 y.o.   MRN: [id number]  HPI Pain is uncontrolled needs to go to pain clinic, back and right kneee hurt, just had right arthroscopy, Back pain extends from neck down low back BRRB started after the surgery , was pushing and has a h/o hemmorhoids, feels that she needs to see GI again, she is advised to take stool softeners daily . HYPERTENSION Disease Monitoring Blood pressure rangeunknown Chest pain- no      Dyspnea- yes Medications Compliance- good Lightheadedness- no   Edema- yes   DIABETES Disease Monitoring Blood Sugar ranges-fasting between 110 to 130 Polyuria- no New Visual problems- no Medications Compliance- good Hypoglycemic symptoms- no   HYPERLIPIDEMIA Disease Monitoring See symptoms for Hypertension Medications Compliance- good RUQ pain- no  Muscle aches- yes      Review of Systems Denies recent fever or chills. Denies sinus pressure, nasal congestion, ear pain or sore throat. Denies chest congestion, productive cough or wheezing. Denies chest pains, palpitations, paroxysmal nocturnal dyspnea, orthopnea  Denies abdominal pain, nausea, vomiting,diarrhea . Denies dysuria, frequency, hesitancy or incontinence. Denies headaches, seizure, numbness, or tingling. Denies uncontroled depression, anxiety or insomnia. Denies skin break down or rash.        Objective:   Physical Exam Patient alert and oriented and in no Cardiopulmonary distress.  HEENT: No facial asymmetry, EOMI, no sinus tenderness, TM's clear, Oropharynx pink and moist.  Neck supple no adenopathy.  Chest: Clear to auscultation bilaterally.  CVS: S1, S2 no murmurs, no S3.  ABD: Soft non tender. Bowel sounds normal.Rectal not done  WUJ:WJXBJ   edema  YN:WGNFAOZHY ROM spine, shoulders, hips and knees.  Skin: Intact, no ulcerations or rash noted.  Psych: Good eye contact, normal affect. Memory intact not anxious or  depressed appearing.  CNS: CN 2-12 intact, power, tone and sensation normal throughout.        Assessment & Plan:

## 2010-10-21 ENCOUNTER — Other Ambulatory Visit: Payer: Self-pay

## 2010-10-21 LAB — TSH: TSH: 1.082 u[IU]/mL (ref 0.350–4.500)

## 2010-10-21 LAB — LIPID PANEL
Cholesterol: 171 mg/dL (ref 0–200)
LDL Cholesterol: 117 mg/dL — ABNORMAL HIGH (ref 0–99)
VLDL: 18 mg/dL (ref 0–40)

## 2010-10-21 LAB — HEMOGLOBIN A1C
Hgb A1c MFr Bld: 6.7 % — ABNORMAL HIGH (ref <5.7)
Mean Plasma Glucose: 146 mg/dL — ABNORMAL HIGH (ref <117)

## 2010-10-21 MED ORDER — OXYCODONE HCL 40 MG PO TB12: 40.0000 mg | ORAL_TABLET | Freq: Three times a day (TID) | ORAL | Status: DC

## 2010-10-22 LAB — COMPLETE METABOLIC PANEL WITH GFR
ALT: 19 U/L (ref 0–35)
AST: 18 U/L (ref 0–37)
Albumin: 4.3 g/dL (ref 3.5–5.2)
BUN: 10 mg/dL (ref 6–23)
Calcium: 9.5 mg/dL (ref 8.4–10.5)
Chloride: 103 mEq/L (ref 96–112)
Potassium: 4.5 mEq/L (ref 3.5–5.3)
Sodium: 141 mEq/L (ref 135–145)
Total Protein: 7.1 g/dL (ref 6.0–8.3)

## 2010-10-24 MED ORDER — METFORMIN HCL 500 MG PO TABS: 500.0000 mg | ORAL_TABLET | Freq: Two times a day (BID) | ORAL | Status: DC

## 2010-10-24 MED ORDER — ATORVASTATIN CALCIUM 20 MG PO TABS: 20.0000 mg | ORAL_TABLET | Freq: Every day | ORAL | Status: DC

## 2010-10-24 NOTE — Progress Notes (Signed)
Dose increase made on Lipitor and Metformin, patient aware and new scripts sent in

## 2010-11-03 DIAGNOSIS — M5440 Lumbago with sciatica, unspecified side: Secondary | ICD-10-CM | POA: Insufficient documentation

## 2010-11-03 DIAGNOSIS — K625 Hemorrhage of anus and rectum: Secondary | ICD-10-CM | POA: Insufficient documentation

## 2010-11-03 DIAGNOSIS — M549 Dorsalgia, unspecified: Secondary | ICD-10-CM | POA: Insufficient documentation

## 2010-11-03 NOTE — Assessment & Plan Note (Signed)
Controlled, no change in medication  

## 2010-11-03 NOTE — Assessment & Plan Note (Signed)
Medication compliance addressed. Commitment to regular exercise and healthy  food choices, with portion control discussed. DASH diet and low fat diet discussed and literature offered. Changes in medication made at this visit.  

## 2010-11-03 NOTE — Assessment & Plan Note (Signed)
Controlled on medication

## 2010-11-03 NOTE — Assessment & Plan Note (Signed)
Uncontrolled and worse will refer to pain clinic per pt request, chronic pain meds no longer effective

## 2010-11-03 NOTE — Assessment & Plan Note (Signed)
Dr field to re-evaluate pt for bright red rectal bleeding

## 2010-11-07 ENCOUNTER — Ambulatory Visit (INDEPENDENT_AMBULATORY_CARE_PROVIDER_SITE_OTHER): Payer: Medicare Other | Admitting: Gastroenterology

## 2010-11-07 ENCOUNTER — Encounter: Payer: Self-pay | Admitting: Gastroenterology

## 2010-11-07 VITALS — BP 144/91 | HR 84 | Temp 96.7°F

## 2010-11-07 DIAGNOSIS — B9681 Helicobacter pylori [H. pylori] as the cause of diseases classified elsewhere: Secondary | ICD-10-CM

## 2010-11-07 DIAGNOSIS — A048 Other specified bacterial intestinal infections: Secondary | ICD-10-CM

## 2010-11-07 DIAGNOSIS — K625 Hemorrhage of anus and rectum: Secondary | ICD-10-CM

## 2010-11-07 DIAGNOSIS — K2970 Gastritis, unspecified, without bleeding: Secondary | ICD-10-CM | POA: Insufficient documentation

## 2010-11-07 DIAGNOSIS — Z1211 Encounter for screening for malignant neoplasm of colon: Secondary | ICD-10-CM | POA: Insufficient documentation

## 2010-11-07 NOTE — Assessment & Plan Note (Signed)
Paper chart unavailable. Will readdress with pt at next visit.

## 2010-11-07 NOTE — Assessment & Plan Note (Signed)
NEXT TCS 2017 unless she has anemia

## 2010-11-07 NOTE — Assessment & Plan Note (Signed)
Pt has had rectal bleeding and 4 colonoscopies in the past 12 years. Last TCS 2007-internal hemorrhoids.  Discussed management options with pt-1. Do nothing, 2. medical management & if fails then surgery, 3. Surgery. Pt does not need another TCS unless she has anemia. CBC today. Rx Proctofoam given for 10 days and may repeat x1. Continue high fiber diet. OPV in 2 mos.

## 2010-11-07 NOTE — Progress Notes (Signed)
Cc to PCP 

## 2010-11-07 NOTE — Progress Notes (Signed)
  Subjective:    [name], female    DOB: 02-28-1961, 50 y.o.   MRN: [id number]   PCP: [facility]  HPI Has had rectal bleeding off and on since before 2000. Had multiple TCS due to rectal bleeding and had internal hemorrhoids. Last TCS 2007 [facility] had int/ext hemorrhoids. No pain in rectum. Bms: constipated and now soft stool. Been on stool softener for the past month. Now BM:2-3x/day. Eating fiber: yams, carrots. Hasn't used Prep H. No abd pain. C/o knee and back pain. Was constipated and now having soft stools. Having rectal bleeding 3x/week.   Review of Systems  All other systems reviewed and are negative.       Objective:   Physical Exam  Constitutional: She is oriented to person, place, and time. She appears well-developed and well-nourished. No distress.  HENT:  Head: Normocephalic.  Eyes: Pupils are equal, round, and reactive to light.  Neck: Normal range of motion.  Cardiovascular: Normal rate, regular rhythm and normal heart sounds.   Pulmonary/Chest: Effort normal and breath sounds normal.  Abdominal: Soft. Bowel sounds are normal. She exhibits no distension. There is no tenderness.  Musculoskeletal: She exhibits no edema.  Lymphadenopathy:    She has no cervical adenopathy.  Neurological: She is alert and oriented to person, place, and time.  Skin: No rash noted.          Assessment & Plan:

## 2010-11-09 ENCOUNTER — Telehealth: Payer: Self-pay | Admitting: Family Medicine

## 2010-11-09 NOTE — Telephone Encounter (Signed)
Advised patient this med is not on med list therefore i cannot refill, will have to use otc med for yeast

## 2010-11-12 NOTE — Progress Notes (Signed)
Please call pt. She needs to have her CBC drawn.

## 2010-11-15 ENCOUNTER — Other Ambulatory Visit: Payer: Self-pay | Admitting: Family Medicine

## 2010-11-15 NOTE — Progress Notes (Signed)
LMOM for pt to complete lab work as ordered (CBC with Diff)

## 2010-11-16 LAB — CBC WITH DIFFERENTIAL/PLATELET
Basophils Absolute: 0 10*3/uL (ref 0.0–0.1)
Basophils Relative: 0 % (ref 0–1)
Eosinophils Absolute: 0.1 10*3/uL (ref 0.0–0.7)
Eosinophils Relative: 2 % (ref 0–5)
HCT: 36.9 % (ref 36.0–46.0)
Hemoglobin: 11.9 g/dL — ABNORMAL LOW (ref 12.0–15.0)
Lymphocytes Relative: 54 % — ABNORMAL HIGH (ref 12–46)
Lymphs Abs: 3.1 10*3/uL (ref 0.7–4.0)
MCH: 26.3 pg (ref 26.0–34.0)
MCHC: 32.2 g/dL (ref 30.0–36.0)
MCV: 81.5 fL (ref 78.0–100.0)
Monocytes Absolute: 0.2 10*3/uL (ref 0.1–1.0)
Monocytes Relative: 3 % (ref 3–12)
Neutro Abs: 2.4 10*3/uL (ref 1.7–7.7)
Neutrophils Relative %: 41 % — ABNORMAL LOW (ref 43–77)
Platelets: 423 10*3/uL — ABNORMAL HIGH (ref 150–400)
RBC: 4.53 MIL/uL (ref 3.87–5.11)
RDW: 14.8 % (ref 11.5–15.5)
WBC: 5.8 10*3/uL (ref 4.0–10.5)

## 2010-11-16 NOTE — Progress Notes (Signed)
Pt scheduled for 52m follow up appt on 09/05

## 2010-11-17 ENCOUNTER — Telehealth: Payer: Self-pay | Admitting: Gastroenterology

## 2010-11-17 ENCOUNTER — Other Ambulatory Visit: Payer: Self-pay

## 2010-11-17 NOTE — Telephone Encounter (Signed)
Pt was informed.

## 2010-11-17 NOTE — Telephone Encounter (Signed)
LMOM to call.

## 2010-11-17 NOTE — Telephone Encounter (Signed)
Please call pt. Her Hb is 11.9, which is essentially normal. OPV in 2 mos. Continue treatment for her hemorrhoids. Will recheck CBC at next visit.

## 2010-11-18 ENCOUNTER — Other Ambulatory Visit: Payer: Self-pay

## 2010-11-18 MED ORDER — OXYCODONE HCL 40 MG PO TB12: 40.0000 mg | ORAL_TABLET | Freq: Three times a day (TID) | ORAL | Status: DC

## 2010-11-29 NOTE — Telephone Encounter (Signed)
Pt is aware of OV for 9/5 @ 0930 with SF

## 2010-11-30 ENCOUNTER — Other Ambulatory Visit: Payer: Self-pay | Admitting: Family Medicine

## 2010-12-01 ENCOUNTER — Other Ambulatory Visit: Payer: Self-pay | Admitting: Family Medicine

## 2010-12-02 ENCOUNTER — Encounter: Payer: Self-pay | Admitting: Family Medicine

## 2010-12-05 ENCOUNTER — Encounter: Payer: Self-pay | Admitting: Family Medicine

## 2010-12-06 ENCOUNTER — Ambulatory Visit (INDEPENDENT_AMBULATORY_CARE_PROVIDER_SITE_OTHER): Payer: Medicare Other | Admitting: Family Medicine

## 2010-12-06 ENCOUNTER — Encounter: Payer: Self-pay | Admitting: Family Medicine

## 2010-12-06 VITALS — BP 160/90 | HR 78 | Resp 16 | Ht 65.5 in | Wt 292.0 lb

## 2010-12-06 DIAGNOSIS — F329 Major depressive disorder, single episode, unspecified: Secondary | ICD-10-CM

## 2010-12-06 DIAGNOSIS — E119 Type 2 diabetes mellitus without complications: Secondary | ICD-10-CM

## 2010-12-06 DIAGNOSIS — I10 Essential (primary) hypertension: Secondary | ICD-10-CM

## 2010-12-06 DIAGNOSIS — E785 Hyperlipidemia, unspecified: Secondary | ICD-10-CM

## 2010-12-06 DIAGNOSIS — F411 Generalized anxiety disorder: Secondary | ICD-10-CM

## 2010-12-06 MED ORDER — ZOLPIDEM TARTRATE 10 MG PO TABS: ORAL_TABLET | ORAL | Status: DC

## 2010-12-06 MED ORDER — CYCLOBENZAPRINE HCL 10 MG PO TABS: ORAL_TABLET | ORAL | Status: DC

## 2010-12-06 MED ORDER — GABAPENTIN 800 MG PO TABS: ORAL_TABLET | ORAL | Status: DC

## 2010-12-06 MED ORDER — ATORVASTATIN CALCIUM 20 MG PO TABS: 20.0000 mg | ORAL_TABLET | Freq: Every day | ORAL | Status: DC

## 2010-12-06 MED ORDER — DIAZEPAM 5 MG PO TABS: ORAL_TABLET | ORAL | Status: DC

## 2010-12-06 MED ORDER — AMLODIPINE BESYLATE 10 MG PO TABS: 10.0000 mg | ORAL_TABLET | Freq: Every day | ORAL | Status: DC

## 2010-12-06 NOTE — Assessment & Plan Note (Addendum)
Uncontrolled, LDL elevated, pt to reduce fried and fatty food intake

## 2010-12-06 NOTE — Assessment & Plan Note (Signed)
Increased due to problems with her daughter

## 2010-12-06 NOTE — Progress Notes (Signed)
  Subjective:    [name], female    DOB: 04-19-1961, 50 y.o.   MRN: [id number]  HPI The PT is here for follow up and re-evaluation of chronic medical conditions, medication management and review of any available recent lab and radiology data.  Preventive health is updated, specifically  Cancer screening and Immunization.   Questions or concerns regarding consultations or procedures which the PT has had in the interim are  addressed. The PT denies any adverse reactions to current medications since the last visit.  Pt has gained a significant amt of weight , staying home and eating constantly , somewhat depressed       Review of Systems    Denies recent fever or chills. Denies sinus pressure, nasal congestion, ear pain or sore throat. Denies chest congestion, productive cough or wheezing. Denies chest pains, palpitations and leg swelling Denies abdominal pain, nausea, vomiting,diarrhea or constipation.   Denies dysuria, frequency, hesitancy or incontinence. C/o back pain however states unable and unwilling to pay pain specialist so she will stay on regime from this office Denies headaches, seizures, numbness, or tingling. C/o depression and anxiety, worried about her daughter Denies skin break down or rash.     Objective:   Physical Exam Patient alert and oriented and in no cardiopulmonary distress.  HEENT: No facial asymmetry, EOMI, no sinus tenderness,  oropharynx pink and moist.  Neck supple no adenopathy.  Chest: Clear to auscultation bilaterally.  CVS: S1, S2 no murmurs, no S3.  ABD: Soft non tender. Bowel sounds normal.  Ext: No edema  MS: decreased  ROM spine, shoulders, hips and knees.  Skin: Intact, no ulcerations or rash noted.  Psych: Good eye contact, normal affect. Memory intact not anxious or depressed appearing.  CNS: CN 2-12 intact, power, tone and sensation normal throughout.        Assessment & Plan:

## 2010-12-06 NOTE — Patient Instructions (Addendum)
F/u end September.  Fasting labs before next visit.September 22 or after  LABWORK  NEEDS TO BE DONE BETWEEN 3 TO 7 DAYS BEFORE YOUR NEXT SCEDULED  VISIT.  THIS WILL IMPROVE THE QUALITY OF YOUR CARE.    It is important that you exercise regularly at least 30 minutes 5 times a week. If you develop chest pain, have severe difficulty breathing, or feel very tired, stop exercising immediately and seek medical attention    A healthy diet is rich in fruit, vegetables and whole grains. Poultry fish, nuts and beans are a healthy choice for protein rather then red meat. A low sodium diet and drinking 64 ounces of water daily is generally recommended. Oils and sweet should be limited. Carbohydrates especially for those who are diabetic or overweight, should be limited to 34-45 gram per meal. It is important to eat on a regular schedule, at least 3 times daily. Snacks should be primarily fruits, vegetables or nuts.   DOSE increase amlodipine  To 10mg  one daily, OK to take TWO amlodipine 5mg  TWO daily in place.  PLs go to group diabetic sessions , call and register

## 2010-12-06 NOTE — Assessment & Plan Note (Signed)
Controlled , but not as well as in the past, diabetic class advised, and change in diet

## 2010-12-06 NOTE — Assessment & Plan Note (Signed)
Medication compliance addressed. Commitment to regular exercise and healthy  food choices, with portion control discussed. DASH diet and low fat diet discussed and literature offered. Changes in medication made at this visit.  

## 2010-12-07 NOTE — Assessment & Plan Note (Signed)
Increased depresssion, unemployed and worried about her daughter, not suicidal or homicidal. No med change at this time

## 2010-12-14 ENCOUNTER — Telehealth: Payer: Self-pay | Admitting: Family Medicine

## 2010-12-14 ENCOUNTER — Ambulatory Visit (INDEPENDENT_AMBULATORY_CARE_PROVIDER_SITE_OTHER): Payer: Medicare Other | Admitting: Family Medicine

## 2010-12-14 VITALS — BP 130/84 | Wt 286.0 lb

## 2010-12-14 DIAGNOSIS — M545 Low back pain: Secondary | ICD-10-CM

## 2010-12-14 DIAGNOSIS — M549 Dorsalgia, unspecified: Secondary | ICD-10-CM

## 2010-12-14 MED ORDER — METHYLPREDNISOLONE ACETATE 80 MG/ML IJ SUSP
80.0000 mg | Freq: Once | INTRAMUSCULAR | Status: AC
  Administered 2010-12-14: 80 mg via INTRAMUSCULAR

## 2010-12-14 MED ORDER — KETOROLAC TROMETHAMINE 30 MG/ML IJ SOLN
60.0000 mg | Freq: Once | INTRAMUSCULAR | Status: AC
  Administered 2010-12-14: 60 mg via INTRAMUSCULAR

## 2010-12-14 NOTE — Telephone Encounter (Signed)
Give toradol 60mg  IM and if no depomedrol in the past 3 months, depomedrol 80mg  IM also pls

## 2010-12-14 NOTE — Progress Notes (Signed)
toradol 60 and depo medrol 80 given per dr Lodema Hong. No complications

## 2010-12-14 NOTE — Telephone Encounter (Signed)
C/o hurting in her back and right leg. She is going out of town and wants to know if she can get a pain shot this PM?

## 2010-12-15 NOTE — Telephone Encounter (Signed)
Patient received both injections in the office

## 2010-12-16 ENCOUNTER — Other Ambulatory Visit: Payer: Self-pay

## 2010-12-16 MED ORDER — OXYCODONE HCL 40 MG PO TB12: 40.0000 mg | ORAL_TABLET | Freq: Three times a day (TID) | ORAL | Status: DC

## 2010-12-28 ENCOUNTER — Other Ambulatory Visit: Payer: Self-pay | Admitting: Family Medicine

## 2011-01-04 ENCOUNTER — Encounter: Payer: Self-pay | Admitting: Gastroenterology

## 2011-01-04 ENCOUNTER — Ambulatory Visit (INDEPENDENT_AMBULATORY_CARE_PROVIDER_SITE_OTHER): Payer: Medicare Other | Admitting: Gastroenterology

## 2011-01-04 VITALS — BP 131/82 | HR 88 | Temp 97.8°F | Ht 65.0 in | Wt 282.0 lb

## 2011-01-04 DIAGNOSIS — K625 Hemorrhage of anus and rectum: Secondary | ICD-10-CM

## 2011-01-04 NOTE — Progress Notes (Signed)
Cc to PCP 

## 2011-01-04 NOTE — Progress Notes (Signed)
Subjective:    [name], female    DOB: [birth year], 50 y.o.   MRN: [id number]  PCP: SIMPSON  HPI Has had rectal bleeding off and on since before 2000. Had multiple TCS due to rectal bleeding and had internal hemorrhoids. Last TCS 2007 [facility] had int/ext hemorrhoids. No pain in rectum. Bms: constipated and now soft stool. Been on stool softener for the past month. Now BM:2-3x/day. Eating fiber: yams, carrots. Hasn't used Prep H. No abd pain. C/o knee and back pain. Was constipated and now having soft stools. Having rectal bleeding 3x/week.  CURRENTLY SEEING BLEEDING AFTER EVERY BM. OCCURS W/ OR W/O STRAINING.  EATING FRUITS AND NOT HAVING CONSTIPATION. Having a BM: 3x/day. No rectal pain, itching, or abd pain. PT NOT INTERESTED IN SURGERY RIGHT NOW.   Past Medical History  Diagnosis Date  . Low back pain   . Hyperthyroidism   . Hypertension   . DM type 2 (diabetes mellitus, type 2)   . Depression   . Asthma   . Anxiety   . Helicobacter pylori gastritis 2008  . Severe obesity (BMI >= 40) NOV 2008 270 LBS    Past Surgical History  Procedure Date  . Cholecystectomy   . Left knee arthroscopic surgery 1999  . Back surgery 1993  . Back surgery 1994  . Back surgery 2002  . Left salphingectomy secondary to ectopic pregnancy 1991  . Partial hysterectomy 1991  . Back surgery 2011    [facility]   . Knee arthroscopy 10/04/2010,     right knee arthroscopy, dr Thomasena Edis  . Upper gastrointestinal endoscopy 2008 abd pain    H. pylori gastritis  . Upper gastrointestinal endoscopy [place] [facility] [place]    PUD  . Colonoscopy 2000 [facility]    INT HEMORRHOIDS/FISSURE  . Colonoscopy 2003 [facility], CHANGE IN BOWEL HABITS    INT HEMORRHOIDS  . Colonoscopy 2006 [facility]    INT HEMORRHOIDS  . Colonoscopy 2007 [facility]    INT HEMORRHOIDS    Allergies  Allergen Reactions  . Aspirin Other (See Comments)  . Morphine    Current Outpatient Prescriptions  Medication Sig Dispense Refill  . albuterol  (PROAIR HFA) 108 (90 BASE) MCG/ACT inhaler Inhale 2 puffs into the lungs every 6 (six) hours as needed. 2 puffs by mouth every 6-8 hours prn         . albuterol (PROVENTIL HFA) 108 (90 BASE) MCG/ACT inhaler Inhale 2 puffs into the lungs every 6 (six) hours as needed. 2 puffs by mouth every 6-8 hours as needed       . amLODipine (NORVASC) 5 MG tablet Take 10 mg by mouth daily.        Marland Kitchen atorvastatin (LIPITOR) 20 MG tablet Take 1 tablet (20 mg total) by mouth daily. Dose increase  30 tablet  4  . benzonatate (TESSALON PERLES) 100 MG capsule Take 100 mg by mouth 3 (three) times daily as needed. Take one capsule by mouth three times daily         . cyclobenzaprine (FLEXERIL) 10 MG tablet TAKE 1 TABLET BY MOUTH THREE TIMES DAILY  90 tablet  4  . diazepam (VALIUM) 5 MG tablet TAKE 1 TABLET BY MOUTH THREE TIMES DAILY  90 tablet  4  . doxycycline (DORYX) 100 MG DR capsule Take 100 mg by mouth 2 (two) times daily. Take one capsule by mouth two times a day       . FLUoxetine (PROZAC) 20 MG capsule TAKE ONE CAPSULE  BY MOUTH EVERY DAY  30 capsule  0  . fluticasone (FLONASE) 50 MCG/ACT nasal spray USE 2 SPRAY IN EACH NOSTRIL EVERY DAY  16 g  3  . gabapentin (NEURONTIN) 800 MG tablet TAKE 1 TABLET BY MOUTH FOUR TIMES DAILY  120 tablet  2  . LOSARTAN POTASSIUM-HCTZ PO Take by mouth. Take 1 tablet by mouth once a day d/c diovan / hctz on completion of current prescription      . metFORMIN (GLUCOPHAGE) 500 MG tablet Take 1 tablet (500 mg total) by mouth 2 (two) times daily with a meal. Dose increase  180 tablet  1  . montelukast (SINGULAIR) 10 MG tablet Take 10 mg by mouth daily. Take 1 tablet by mouth once a day        . NEXIUM 40 MG capsule TAKE 1 CAPSULE BY MOUTH EVERY DAY  30 capsule  0  . nitroGLYCERIN (NITROLINGUAL) 0.4 MG/SPRAY spray Place 1 spray under the tongue as directed.        Marland Kitchen oxyCODONE (OXYCONTIN) 40 MG 12 hr tablet Take 1 tablet (40 mg total) by mouth 3 (three) times daily.  90 tablet  0  .  potassium chloride SA (K-DUR,KLOR-CON) 20 MEQ tablet One tab daily  30 tablet  3  . SYMBICORT 160-4.5 MCG/ACT inhaler INHALE 2 PUFFS BY MOUTH TWICE DAILY  10.2 g  3  . Vitamin D, Ergocalciferol, (DRISDOL) 50000 UNITS CAPS Take 50,000 Units by mouth every 7 (seven) days. Take one tablet by mouth once a week       . zolpidem (AMBIEN) 10 MG tablet TAKE 1 TABLET BY MOUTH EVERY NIGHT AT BEDTIME  30 tablet  4        Review of Systems     Objective:   Physical Exam  Vitals reviewed. Constitutional: She is oriented to person, place, and time. She appears well-developed and well-nourished. No distress.  HENT:  Head: Normocephalic and atraumatic.  Cardiovascular: Normal rate and regular rhythm.   Pulmonary/Chest: Effort normal and breath sounds normal.  Abdominal: Soft. Bowel sounds are normal. There is no tenderness.       OBESE   Neurological: She is alert and oriented to person, place, and time.          Assessment & Plan:

## 2011-01-04 NOTE — Assessment & Plan Note (Addendum)
Still having BRBPR. Pt not interested in surgery. Protocfoam helped.  Pt declined Rx for Proctofoam and surgery. OPV in 6 mos. RECHECK CBC.

## 2011-01-05 NOTE — Progress Notes (Signed)
Reminder in epic to follow up in 6 months and to repeat CBC

## 2011-01-17 ENCOUNTER — Ambulatory Visit (INDEPENDENT_AMBULATORY_CARE_PROVIDER_SITE_OTHER): Payer: Medicare Other | Admitting: Family Medicine

## 2011-01-17 ENCOUNTER — Encounter: Payer: Self-pay | Admitting: Family Medicine

## 2011-01-17 ENCOUNTER — Other Ambulatory Visit: Payer: Self-pay | Admitting: Family Medicine

## 2011-01-17 VITALS — BP 120/70 | HR 68 | Resp 16 | Ht 65.5 in | Wt 284.0 lb

## 2011-01-17 DIAGNOSIS — F329 Major depressive disorder, single episode, unspecified: Secondary | ICD-10-CM

## 2011-01-17 DIAGNOSIS — E785 Hyperlipidemia, unspecified: Secondary | ICD-10-CM

## 2011-01-17 DIAGNOSIS — R51 Headache: Secondary | ICD-10-CM | POA: Insufficient documentation

## 2011-01-17 DIAGNOSIS — M545 Low back pain: Secondary | ICD-10-CM

## 2011-01-17 DIAGNOSIS — H53139 Sudden visual loss, unspecified eye: Secondary | ICD-10-CM

## 2011-01-17 DIAGNOSIS — R0989 Other specified symptoms and signs involving the circulatory and respiratory systems: Secondary | ICD-10-CM

## 2011-01-17 DIAGNOSIS — R519 Headache, unspecified: Secondary | ICD-10-CM | POA: Insufficient documentation

## 2011-01-17 DIAGNOSIS — I10 Essential (primary) hypertension: Secondary | ICD-10-CM

## 2011-01-17 DIAGNOSIS — Z79899 Other long term (current) drug therapy: Secondary | ICD-10-CM

## 2011-01-17 DIAGNOSIS — E119 Type 2 diabetes mellitus without complications: Secondary | ICD-10-CM

## 2011-01-17 NOTE — Assessment & Plan Note (Signed)
Intermittent right sided, recent episodes of loss of vision

## 2011-01-17 NOTE — Patient Instructions (Addendum)
F/u as before   You are referred for carotid doppler to evaluate for neck blockage, also a brain scan and for neurology evaluation in [place].  A healthy diet is rich in fruit, vegetables and whole grains. Poultry fish, nuts and beans are a healthy choice for protein rather then red meat. A low sodium diet and drinking 64 ounces of water daily is generally recommended. Oils and sweet should be limited. Carbohydrates especially for those who are diabetic or overweight, should be limited to 30-45 gram per meal. It is important to eat on a regular schedule, at least 3 times daily. Snacks should be primarily fruits, vegetables or nuts.  It is important that you exercise regularly at least 30 minutes 5 times a week. If you develop chest pain, have severe difficulty breathing, or feel very tired, stop exercising immediately and seek medical attention   LABWORK  NEEDS TO BE DONE BETWEEN 3 TO 7 DAYS BEFORE YOUR NEXT SCEDULED  VISIT.  THIS WILL IMPROVE THE QUALITY OF YOUR CARE. Fasting labs

## 2011-01-17 NOTE — Progress Notes (Signed)
  Subjective:    [name], female    DOB: Aug 15, 1960, 50 y.o.   MRN: [id number]  HPI  4 days ago and again today pt had acute loss of vision in right eye, duration was 45 mins on Sat, today, approx 30 mins, no other symptoms.Specifically denies difficulty with speech, swallowing, or any weakness or numbness of any extremities. Had eye exam today, reportedly no abn seen and was told to have her medical doc further evaluate. Otherwise reports she has been doing well, recent;ly found part time employment, so is happy to be out of the house and with a small income.  Review of Systems See HPI Denies recent fever or chills. Denies sinus pressure, nasal congestion, ear pain or sore throat. Denies chest congestion, productive cough or wheezing. Denies chest pains, palpitations and leg swelling Denies abdominal pain, nausea, vomiting,diarrhea or constipation.   Denies dysuria, frequency, hesitancy or incontinence. Chronic back pain with reduced mobilty  Reports intermittent  Headaches,denies  seizures, numbness, or tingling. Denies depression, anxiety or insomnia. Denies skin break down or rash.        Objective:   Physical Exam Patient alert and oriented and in no cardiopulmonary distress.  HEENT: No facial asymmetry, EOMI, no sinus tenderness,  oropharynx pink and moist.  Neck supple no adenopathy.Carotid bruits  Chest: Clear to auscultation bilaterally.  CVS: S1, S2 no murmurs, no S3.  ABD: Soft non tender. Bowel sounds normal.  Ext: No edema  MS: Decreased  ROM spine, adequate in shoulders, hips and knees.  Skin: Intact, no ulcerations or rash noted.  Psych: Good eye contact, normal affect. Memory intact not anxious or depressed appearing.  CNS: CN 2-12 intact, power, tone and sensation normal throughout.        Assessment & Plan:

## 2011-01-19 ENCOUNTER — Ambulatory Visit (HOSPITAL_COMMUNITY)
Admission: RE | Admit: 2011-01-19 | Discharge: 2011-01-19 | Disposition: A | Discharge status: Home / Self Care | Payer: Medicare Other | Source: Ambulatory Visit | Attending: Family Medicine | Admitting: Family Medicine

## 2011-01-19 DIAGNOSIS — R0989 Other specified symptoms and signs involving the circulatory and respiratory systems: Secondary | ICD-10-CM | POA: Insufficient documentation

## 2011-01-19 DIAGNOSIS — I10 Essential (primary) hypertension: Secondary | ICD-10-CM | POA: Insufficient documentation

## 2011-01-19 DIAGNOSIS — E119 Type 2 diabetes mellitus without complications: Secondary | ICD-10-CM | POA: Insufficient documentation

## 2011-01-19 LAB — MICROALBUMIN / CREATININE URINE RATIO
Creatinine, Urine: 112.9 mg/dL
Microalb, Ur: 0.5 mg/dL (ref 0.00–1.89)

## 2011-01-20 ENCOUNTER — Other Ambulatory Visit: Payer: Self-pay

## 2011-01-20 MED ORDER — OXYCODONE HCL 40 MG PO TB12: 40.0000 mg | ORAL_TABLET | Freq: Three times a day (TID) | ORAL | Status: DC

## 2011-01-22 NOTE — Assessment & Plan Note (Signed)
Chronic and unchanged s/p surgery

## 2011-01-22 NOTE — Assessment & Plan Note (Signed)
Controlled, no change in medication  

## 2011-01-22 NOTE — Assessment & Plan Note (Addendum)
Recurrent loss of vision, normal eye exam, bruit, needs doppler

## 2011-01-22 NOTE — Assessment & Plan Note (Signed)
Low fat diet discussed and encouraged, LDL not at goal when last checked

## 2011-01-22 NOTE — Assessment & Plan Note (Signed)
Improved, recently started part time work

## 2011-01-26 ENCOUNTER — Other Ambulatory Visit: Payer: Self-pay | Admitting: Family Medicine

## 2011-01-30 ENCOUNTER — Other Ambulatory Visit: Payer: Self-pay | Admitting: Family Medicine

## 2011-02-03 LAB — DIFFERENTIAL
Basophils Absolute: 0.1
Basophils Relative: 1
Neutro Abs: 4.5
Neutrophils Relative %: 61

## 2011-02-03 LAB — CBC
MCHC: 33.1
RDW: 14.5

## 2011-02-03 LAB — URINALYSIS, ROUTINE W REFLEX MICROSCOPIC
Bilirubin Urine: NEGATIVE
Ketones, ur: NEGATIVE
Nitrite: NEGATIVE
pH: 6

## 2011-02-03 LAB — BASIC METABOLIC PANEL
CO2: 27
Calcium: 8.6
Creatinine, Ser: 0.75
GFR calc Af Amer: >60
Glucose, Bld: 109 — ABNORMAL HIGH

## 2011-02-03 LAB — URINE MICROSCOPIC-ADD ON

## 2011-02-03 LAB — URINE CULTURE
Colony Count: NO GROWTH
Special Requests: NEGATIVE

## 2011-02-07 LAB — DIFFERENTIAL
Basophils Absolute: 0
Basophils Relative: 0
Eosinophils Absolute: 0.2
Eosinophils Relative: 3
Lymphocytes Relative: 32
Lymphs Abs: 2
Monocytes Absolute: 0.4
Monocytes Relative: 6
Neutro Abs: 3.8
Neutrophils Relative %: 59

## 2011-02-07 LAB — BASIC METABOLIC PANEL
BUN: 11
CO2: 28
Calcium: 9.7
Chloride: 105
Creatinine, Ser: 0.7
GFR calc Af Amer: >60
GFR calc non Af Amer: >60
Glucose, Bld: 103 — ABNORMAL HIGH
Potassium: 3.7
Sodium: 139

## 2011-02-07 LAB — URINALYSIS, ROUTINE W REFLEX MICROSCOPIC
Bilirubin Urine: NEGATIVE
Glucose, UA: NEGATIVE
Ketones, ur: NEGATIVE
Leukocytes, UA: NEGATIVE
Nitrite: NEGATIVE
Protein, ur: 30 — AB
Specific Gravity, Urine: 1.025
Urobilinogen, UA: 0.2
pH: 7

## 2011-02-07 LAB — CBC
HCT: 38.1
Hemoglobin: 12.7
MCHC: 33.2
MCV: 80.7
Platelets: 491 — ABNORMAL HIGH
RBC: 4.72
RDW: 14.2
WBC: 6.4

## 2011-02-07 LAB — URINE MICROSCOPIC-ADD ON

## 2011-02-13 ENCOUNTER — Encounter: Payer: Self-pay | Admitting: Family Medicine

## 2011-02-14 ENCOUNTER — Other Ambulatory Visit: Payer: Self-pay | Admitting: Family Medicine

## 2011-02-15 LAB — COMPLETE METABOLIC PANEL WITH GFR
AST: 14 U/L (ref 0–37)
Alkaline Phosphatase: 71 U/L (ref 39–117)
BUN: 15 mg/dL (ref 6–23)
Creat: 0.74 mg/dL (ref 0.50–1.10)
GFR, Est Non African American: >90 mL/min (ref >90)
Total Bilirubin: 0.3 mg/dL (ref 0.3–1.2)

## 2011-02-15 LAB — LIPID PANEL
Cholesterol: 166 mg/dL (ref 0–200)
Total CHOL/HDL Ratio: 3.8 Ratio
Triglycerides: 78 mg/dL (ref <150)
VLDL: 16 mg/dL (ref 0–40)

## 2011-02-16 ENCOUNTER — Encounter: Payer: Self-pay | Admitting: Family Medicine

## 2011-02-16 ENCOUNTER — Ambulatory Visit (INDEPENDENT_AMBULATORY_CARE_PROVIDER_SITE_OTHER): Payer: Medicare Other | Admitting: Family Medicine

## 2011-02-16 VITALS — BP 140/90 | HR 84 | Resp 16 | Ht 65.5 in | Wt 280.4 lb

## 2011-02-16 DIAGNOSIS — J45909 Unspecified asthma, uncomplicated: Secondary | ICD-10-CM

## 2011-02-16 DIAGNOSIS — F329 Major depressive disorder, single episode, unspecified: Secondary | ICD-10-CM

## 2011-02-16 DIAGNOSIS — F3289 Other specified depressive episodes: Secondary | ICD-10-CM

## 2011-02-16 DIAGNOSIS — E669 Obesity, unspecified: Secondary | ICD-10-CM

## 2011-02-16 DIAGNOSIS — M79672 Pain in left foot: Secondary | ICD-10-CM | POA: Insufficient documentation

## 2011-02-16 DIAGNOSIS — M79609 Pain in unspecified limb: Secondary | ICD-10-CM

## 2011-02-16 DIAGNOSIS — E119 Type 2 diabetes mellitus without complications: Secondary | ICD-10-CM

## 2011-02-16 DIAGNOSIS — M549 Dorsalgia, unspecified: Secondary | ICD-10-CM

## 2011-02-16 DIAGNOSIS — I10 Essential (primary) hypertension: Secondary | ICD-10-CM

## 2011-02-16 LAB — URINALYSIS, ROUTINE W REFLEX MICROSCOPIC
Leukocytes, UA: NEGATIVE
Nitrite: NEGATIVE
Specific Gravity, Urine: >1.03 — ABNORMAL HIGH
Urobilinogen, UA: 0.2

## 2011-02-16 LAB — URINE MICROSCOPIC-ADD ON

## 2011-02-16 NOTE — Patient Instructions (Addendum)
F/U in 6 weeks   Congrats on weight loss, goal loss is 1 pound  Per week, you are doing exceptionally well.  We will call with results.  Pls ensure you keep appt with neurologist tomorrow  Re vision change  You are referred to Dr Ulice Brilliant for eavaluation of foot pain

## 2011-02-17 ENCOUNTER — Other Ambulatory Visit: Payer: Self-pay

## 2011-02-17 MED ORDER — OXYCODONE HCL 40 MG PO TB12: 40.0000 mg | ORAL_TABLET | Freq: Three times a day (TID) | ORAL | Status: DC

## 2011-02-17 NOTE — Assessment & Plan Note (Signed)
Controlled, no change in medication  

## 2011-02-17 NOTE — Assessment & Plan Note (Signed)
New onset localized  Foot pain, examination is non revealing and apart from point tenderness is benign, will refer to podiatry for further eval

## 2011-02-17 NOTE — Assessment & Plan Note (Signed)
Will await most recent HBA1C , but has been controlled in the past and with new dietary modification, I anticipate even better control

## 2011-02-17 NOTE — Assessment & Plan Note (Signed)
Uncontrolled at this visit, pt has not taken her medication today, and it is past due

## 2011-02-17 NOTE — Assessment & Plan Note (Signed)
Improved. Pt applauded on succesful weight loss through lifestyle change, and encouraged to continue same. Weight loss goal set for the next several months.  

## 2011-02-17 NOTE — Assessment & Plan Note (Signed)
Improved, espescialy with new part time employment

## 2011-02-17 NOTE — Assessment & Plan Note (Signed)
Unchanged, continue chronic meds 

## 2011-02-17 NOTE — Progress Notes (Signed)
  Subjective:    [name], female    DOB: 09-08-1960, 50 y.o.   MRN: [id number]  HPI The PT is here for follow up and re-evaluation of chronic medical conditions, medication management and review of any available recent lab and radiology data.  Preventive health is updated, specifically  Cancer screening and Immunization.   Pt has still unfortunately been able to have the benefit of neurologic evaluation for vision chnages which are persisting. She does have appt in am She has significantly modified/changed her diet with a total of 12 pound weight loss in the past 3 months, which is excellent. The PT denies any adverse reactions to current medications since the last visit.  C/o localized pain on dorsum of left foot in the past approx 3 weeks , no trauma, thinks at times it looks discoloured      Review of Systems See HPI Denies recent fever or chills. Denies sinus pressure, nasal congestion, ear pain or sore throat. Denies chest congestion, productive cough or wheezing. Denies chest pains, palpitations and leg swelling Denies abdominal pain, nausea, vomiting,diarrhea or constipation.   Denies dysuria, frequency, hesitancy or incontinence.  Denies headaches, seizures, numbness, or tingling. Denies depression, anxiety or insomnia. Denies skin break down or rash.        Objective:   Physical Exam Patient alert and oriented and in no cardiopulmonary distress.  HEENT: No facial asymmetry, EOMI, no sinus tenderness,  oropharynx pink and moist.  Neck supple no adenopathy.  Chest: Clear to auscultation bilaterally.  CVS: S1, S2 no murmurs, no S3.  ABD: Soft non tender. Bowel sounds normal.  Ext: No edema  MS: Decreased  ROM spine,adequate in  shoulders, hips and knees.Tender nodular lesion on dorsum of left foot in region of ankle  Skin: Intact, no ulcerations or rash noted.  Psych: Good eye contact, normal affect. Memory intact not anxious or depressed  appearing.  CNS: CN 2-12 intact, power, tone and sensation normal throughout.       Assessment & Plan:

## 2011-02-21 ENCOUNTER — Other Ambulatory Visit: Payer: Self-pay | Admitting: Neurology

## 2011-02-21 DIAGNOSIS — I10 Essential (primary) hypertension: Secondary | ICD-10-CM

## 2011-02-21 DIAGNOSIS — E119 Type 2 diabetes mellitus without complications: Secondary | ICD-10-CM

## 2011-02-21 DIAGNOSIS — H5310 Unspecified subjective visual disturbances: Secondary | ICD-10-CM

## 2011-02-21 DIAGNOSIS — E785 Hyperlipidemia, unspecified: Secondary | ICD-10-CM

## 2011-02-22 ENCOUNTER — Encounter: Payer: Self-pay | Admitting: Family Medicine

## 2011-02-24 ENCOUNTER — Other Ambulatory Visit: Payer: Self-pay | Admitting: Family Medicine

## 2011-02-28 ENCOUNTER — Encounter: Payer: Self-pay | Admitting: Family Medicine

## 2011-02-28 ENCOUNTER — Ambulatory Visit (INDEPENDENT_AMBULATORY_CARE_PROVIDER_SITE_OTHER): Payer: Medicare Other | Admitting: Family Medicine

## 2011-02-28 VITALS — BP 124/82 | HR 73 | Resp 16 | Ht 65.5 in | Wt 286.8 lb

## 2011-02-28 DIAGNOSIS — I10 Essential (primary) hypertension: Secondary | ICD-10-CM

## 2011-02-28 DIAGNOSIS — E669 Obesity, unspecified: Secondary | ICD-10-CM

## 2011-02-28 DIAGNOSIS — E119 Type 2 diabetes mellitus without complications: Secondary | ICD-10-CM

## 2011-02-28 DIAGNOSIS — J40 Bronchitis, not specified as acute or chronic: Secondary | ICD-10-CM

## 2011-02-28 MED ORDER — AZITHROMYCIN 250 MG PO TABS: ORAL_TABLET | ORAL | Status: AC

## 2011-02-28 MED ORDER — FLUCONAZOLE 150 MG PO TABS: ORAL_TABLET | ORAL | Status: DC

## 2011-02-28 NOTE — Patient Instructions (Signed)
F/u as before  Med is sent in for bronchitis  Pls focus on healthy eating to sustain weight loss

## 2011-03-01 ENCOUNTER — Other Ambulatory Visit: Payer: Medicare Other

## 2011-03-05 NOTE — Assessment & Plan Note (Signed)
Controlled, no change in medication  

## 2011-03-05 NOTE — Assessment & Plan Note (Signed)
Antibiotics prescribed 

## 2011-03-05 NOTE — Assessment & Plan Note (Signed)
Deteriorated. Patient re-educated about  the importance of commitment to a  minimum of 150 minutes of exercise per week. The importance of healthy food choices with portion control discussed. Encouraged to start a food diary, count calories and to consider  joining a support group. Sample diet sheets offered. Goals set by the patient for the next several months.    

## 2011-03-05 NOTE — Progress Notes (Signed)
  Subjective:    [name], female    DOB: [birth year], 50 y.o.   MRN: [id number]  HPI 3 day h/o chest congestion and cough productive of yellow sputum. Has had chills , denies fever.Denies significant nasal congestion and post nasal drainage. Otherwise has been well, except for recent weight gain secondary to increased food intake and no exercise   Review of Systems See HPI Denies chest pains, palpitations and leg swelling Denies abdominal pain, nausea, vomiting,diarrhea or constipation.   Denies dysuria, frequency, hesitancy or incontinence. Chronic  Back  pain, and limitation in mobility. Denies headaches, seizures, numbness, or tingling. Denies depression, anxiety or insomnia. Denies skin break down or rash.        Objective:   Physical Exam Patient alert and oriented and in no cardiopulmonary distress.  HEENT: No facial asymmetry, EOMI, no sinus tenderness,  oropharynx pink and moist.  Neck supple no adenopathy.  Chest: decreased air entry, scattered crackles, no wheezes   CVS: S1, S2 no murmurs, no S3.  ABD: Soft non tender. Bowel sounds normal.  Ext: No edema  WU:[id number] ROM spine, shoulders, hips and knees.  Skin: Intact, no ulcerations or rash noted.  Psych: Good eye contact, normal affect. Memory intact not anxious or depressed appearing.  CNS: CN 2-12 intact, power, tone and sensation normal throughout.       Assessment & Plan:

## 2011-03-09 ENCOUNTER — Ambulatory Visit
Admission: RE | Admit: 2011-03-09 | Discharge: 2011-03-09 | Disposition: A | Discharge status: Home / Self Care | Payer: Medicare Other | Source: Ambulatory Visit | Attending: Neurology | Admitting: Neurology

## 2011-03-09 DIAGNOSIS — E785 Hyperlipidemia, unspecified: Secondary | ICD-10-CM

## 2011-03-09 DIAGNOSIS — H5310 Unspecified subjective visual disturbances: Secondary | ICD-10-CM

## 2011-03-09 DIAGNOSIS — I10 Essential (primary) hypertension: Secondary | ICD-10-CM

## 2011-03-09 DIAGNOSIS — E119 Type 2 diabetes mellitus without complications: Secondary | ICD-10-CM

## 2011-03-09 MED ORDER — GADOBENATE DIMEGLUMINE 529 MG/ML IV SOLN
20.0000 mL | Freq: Once | INTRAVENOUS | Status: AC | PRN
  Administered 2011-03-09: 20 mL via INTRAVENOUS

## 2011-03-20 ENCOUNTER — Encounter: Payer: Self-pay | Admitting: Family Medicine

## 2011-03-27 ENCOUNTER — Other Ambulatory Visit: Payer: Self-pay

## 2011-03-27 MED ORDER — OXYCODONE HCL 40 MG PO TB12: 40.0000 mg | ORAL_TABLET | Freq: Three times a day (TID) | ORAL | Status: DC

## 2011-03-30 ENCOUNTER — Other Ambulatory Visit: Payer: Self-pay | Admitting: Family Medicine

## 2011-03-30 ENCOUNTER — Encounter: Payer: Self-pay | Admitting: Family Medicine

## 2011-03-30 ENCOUNTER — Ambulatory Visit (INDEPENDENT_AMBULATORY_CARE_PROVIDER_SITE_OTHER): Payer: Medicare Other | Admitting: Family Medicine

## 2011-03-30 VITALS — BP 120/80 | HR 85 | Resp 16 | Ht 65.5 in | Wt 282.8 lb

## 2011-03-30 DIAGNOSIS — M545 Low back pain, unspecified: Secondary | ICD-10-CM

## 2011-03-30 DIAGNOSIS — E785 Hyperlipidemia, unspecified: Secondary | ICD-10-CM

## 2011-03-30 DIAGNOSIS — E669 Obesity, unspecified: Secondary | ICD-10-CM

## 2011-03-30 DIAGNOSIS — Z23 Encounter for immunization: Secondary | ICD-10-CM

## 2011-03-30 DIAGNOSIS — F329 Major depressive disorder, single episode, unspecified: Secondary | ICD-10-CM

## 2011-03-30 DIAGNOSIS — E559 Vitamin D deficiency, unspecified: Secondary | ICD-10-CM

## 2011-03-30 DIAGNOSIS — I10 Essential (primary) hypertension: Secondary | ICD-10-CM

## 2011-03-30 DIAGNOSIS — F3289 Other specified depressive episodes: Secondary | ICD-10-CM

## 2011-03-30 DIAGNOSIS — J45909 Unspecified asthma, uncomplicated: Secondary | ICD-10-CM

## 2011-03-30 DIAGNOSIS — E119 Type 2 diabetes mellitus without complications: Secondary | ICD-10-CM

## 2011-03-30 MED ORDER — OXYCODONE HCL 40 MG PO TB12: 40.0000 mg | ORAL_TABLET | Freq: Three times a day (TID) | ORAL | Status: DC

## 2011-03-30 NOTE — Progress Notes (Signed)
  Subjective:    [name], female    DOB: November 18, 1960, 50 y.o.   MRN: [id number]  HPI For the  past 3 years pt has had intermittent loss of vision in the right eye, only after sleeps awakens early in the morning, increased frequency for the past 6 months, on average 4 times per week. Neurologist has seen her twice per report of pt no cause has been identified. She has been able to lose weight in the past several weeks. Denies hypoglycemic episodes, polyuria or polydipsia, fasting sugars are seldom over 110. Chronic back pain unchanged, controlled on current medication. Depression and anxiety are stable and controlled on medication, feels much better since starting part time work   Review of Systems See HPI Denies recent fever or chills. Denies sinus pressure, nasal congestion, ear pain or sore throat. Denies chest congestion, productive cough or wheezing. Denies chest pains, palpitations and leg swelling Denies abdominal pain, nausea, vomiting,diarrhea or constipation.   Denies dysuria, frequency, hesitancy or incontinence.  Denies headaches, seizures, numbness, or tingling. Denies depression, anxiety or insomnia. Denies skin break down or rash.        Objective:   Physical Exam Patient alert and oriented and in no cardiopulmonary distress.  HEENT: No facial asymmetry, EOMI, no sinus tenderness,  oropharynx pink and moist.  Neck supple no adenopathy.  Chest: Clear to auscultation bilaterally.  CVS: S1, S2 no murmurs, no S3.  ABD: Soft non tender. Bowel sounds normal.  Ext: No edema  MS: decreased  ROM spine,adequate in  shoulders, hips and knees.  Skin: Intact, no ulcerations or rash noted.  Psych: Good eye contact, normal affect. Memory intact not anxious or depressed appearing.  CNS: CN 2-12 intact, power, tone and sensation normal throughout.        Assessment & Plan:

## 2011-03-30 NOTE — Assessment & Plan Note (Signed)
Controlled, no change in medication  

## 2011-03-30 NOTE — Assessment & Plan Note (Signed)
Hyperlipidemia:Low fat diet discussed and encouraged.  Lipids in feb

## 2011-03-30 NOTE — Patient Instructions (Signed)
F/U in February, mid to end.  Congrats on weight loss  Blood pressure is excellent.  HBA1C and chem 7 and vit D in feb non fasting.   It is important that you exercise regularly at least 30 minutes 5 times a week. If you develop chest pain, have severe difficulty breathing, or feel very tired, stop exercising immediately and seek medical attention    A healthy diet is rich in fruit, vegetables and whole grains. Poultry fish, nuts and beans are a healthy choice for protein rather then red meat. A low sodium diet and drinking 64 ounces of water daily is generally recommended. Oils and sweet should be limited. Carbohydrates especially for those who are diabetic or overweight, should be limited to 30-45 gram per meal. It is important to eat on a regular schedule, at least 3 times daily. Snacks should be primarily fruits, vegetables or nuts.  Flu vaccine today  You will get an additional script of pain med to leave at pharmacy today for December to be filled when due

## 2011-03-30 NOTE — Assessment & Plan Note (Signed)
Improved. Pt applauded on succesful weight loss through lifestyle change, and encouraged to continue same. Weight loss goal set for the next several months.  

## 2011-04-01 NOTE — Assessment & Plan Note (Signed)
Unchanged, continue medication as before 

## 2011-04-01 NOTE — Assessment & Plan Note (Signed)
Controlled, no change in medication  

## 2011-04-17 ENCOUNTER — Telehealth: Payer: Self-pay | Admitting: Family Medicine

## 2011-04-17 NOTE — Telephone Encounter (Signed)
Is having drainage into her throat, deep cough, no mucus production yet, sweating. Scratchy in her throat. Drinking tea with honey but wants something sent in

## 2011-04-17 NOTE — Telephone Encounter (Signed)
Called patient left message

## 2011-04-18 NOTE — Telephone Encounter (Signed)
Started yesterday, Wants something sent in for congestion to [facility]. Feels like its getting in her chest. Keeps draining down her throat.

## 2011-04-19 ENCOUNTER — Other Ambulatory Visit: Payer: Self-pay | Admitting: Family Medicine

## 2011-04-19 MED ORDER — BENZONATATE 100 MG PO CAPS: 100.0000 mg | ORAL_CAPSULE | Freq: Four times a day (QID) | ORAL | Status: DC | PRN

## 2011-04-19 NOTE — Telephone Encounter (Signed)
Patient aware.

## 2011-04-19 NOTE — Telephone Encounter (Signed)
I have sent in tessalon perles let her know, she can also take sudafed one daily for a few days to reduce post nasal drainage

## 2011-04-20 ENCOUNTER — Other Ambulatory Visit: Payer: Self-pay | Admitting: Family Medicine

## 2011-04-26 ENCOUNTER — Other Ambulatory Visit: Payer: Self-pay | Admitting: Family Medicine

## 2011-04-27 ENCOUNTER — Emergency Department (HOSPITAL_COMMUNITY)
Admission: EM | Admit: 2011-04-27 | Discharge: 2011-04-27 | Disposition: A | Discharge status: Home / Self Care | Payer: Medicare Other | Attending: Emergency Medicine | Admitting: Emergency Medicine

## 2011-04-27 ENCOUNTER — Emergency Department (HOSPITAL_COMMUNITY): Payer: Medicare Other

## 2011-04-27 ENCOUNTER — Encounter (HOSPITAL_COMMUNITY): Payer: Self-pay | Admitting: Emergency Medicine

## 2011-04-27 DIAGNOSIS — E669 Obesity, unspecified: Secondary | ICD-10-CM | POA: Insufficient documentation

## 2011-04-27 DIAGNOSIS — Z6841 Body Mass Index (BMI) 40.0 and over, adult: Secondary | ICD-10-CM | POA: Insufficient documentation

## 2011-04-27 DIAGNOSIS — E119 Type 2 diabetes mellitus without complications: Secondary | ICD-10-CM | POA: Insufficient documentation

## 2011-04-27 DIAGNOSIS — I10 Essential (primary) hypertension: Secondary | ICD-10-CM | POA: Insufficient documentation

## 2011-04-27 DIAGNOSIS — F3289 Other specified depressive episodes: Secondary | ICD-10-CM | POA: Insufficient documentation

## 2011-04-27 DIAGNOSIS — J45909 Unspecified asthma, uncomplicated: Secondary | ICD-10-CM | POA: Insufficient documentation

## 2011-04-27 DIAGNOSIS — F329 Major depressive disorder, single episode, unspecified: Secondary | ICD-10-CM | POA: Insufficient documentation

## 2011-04-27 DIAGNOSIS — E0590 Thyrotoxicosis, unspecified without thyrotoxic crisis or storm: Secondary | ICD-10-CM | POA: Insufficient documentation

## 2011-04-27 DIAGNOSIS — J40 Bronchitis, not specified as acute or chronic: Secondary | ICD-10-CM | POA: Insufficient documentation

## 2011-04-27 DIAGNOSIS — Z79899 Other long term (current) drug therapy: Secondary | ICD-10-CM | POA: Insufficient documentation

## 2011-04-27 DIAGNOSIS — R059 Cough, unspecified: Secondary | ICD-10-CM | POA: Insufficient documentation

## 2011-04-27 DIAGNOSIS — R05 Cough: Secondary | ICD-10-CM | POA: Insufficient documentation

## 2011-04-27 MED ORDER — ALBUTEROL SULFATE HFA 108 (90 BASE) MCG/ACT IN AERS
2.0000 | INHALATION_SPRAY | RESPIRATORY_TRACT | Status: DC
  Administered 2011-04-27: 2 via RESPIRATORY_TRACT
  Filled 2011-04-27 (×2): qty 6.7

## 2011-04-27 MED ORDER — IBUPROFEN 400 MG PO TABS
600.0000 mg | ORAL_TABLET | Freq: Once | ORAL | Status: AC
  Administered 2011-04-27: 600 mg via ORAL
  Filled 2011-04-27: qty 2

## 2011-04-27 NOTE — ED Provider Notes (Signed)
History     CSN: [id number]  Arrival date & time 04/27/11  0132   First MD Initiated Contact with Patient 04/27/11 0240      Chief Complaint  Patient presents with  . Cough  . Nasal Congestion    (Consider location/radiation/quality/duration/timing/severity/associated sxs/prior treatment) HPI The patient reports coughing congestion for approximately one week.  She reports no fever or chills.  She reports the cough has been keeping her up all night.  She reports the cough is worse when she lays flat and thus worsening or postnasal drip.  Z. nasal congestion and nasal drainage.  She denies unilateral leg swelling.  She denies pulmonary embolism or DVT.  Her symptoms are worsened by lying flat.  She has no shortness of breath or exertional shortness of breath.  She presents today complaining mainly of the cough and the fact that the cough is keeping her up at night not allowing her to sleep.   Past Medical History  Diagnosis Date  . Low back pain   . Hyperthyroidism   . Hypertension   . DM type 2 (diabetes mellitus, type 2)   . Depression   . Asthma   . Anxiety   . Helicobacter pylori gastritis 2008  . Severe obesity (BMI >= 40) NOV 2008 270 LBS    Past Surgical History  Procedure Date  . Cholecystectomy   . Left knee arthroscopic surgery 1999  . Back surgery 1993  . Back surgery 1994  . Back surgery 2002  . Left salphingectomy secondary to ectopic pregnancy 1991  . Partial hysterectomy 1991  . Back surgery 2011    [facility]   . Knee arthroscopy 10/04/2010,     right knee arthroscopy, dr Thomasena Edis  . Upper gastrointestinal endoscopy 2008 abd pain    H. pylori gastritis  . Upper gastrointestinal endoscopy 1996 [facility], [place]    PUD  . Colonoscopy 2000 [facility]    [facility] H[facility]HO[facility]/FISSURE  . Colonoscopy 2003 [facility], CHANGE IN BOWEL HABITS    [facility] HEMORRHOIDS  . Colonoscopy 2006 [facility]    [facility] HEMORRHOIDS  . Colonoscopy 2007 [facility]    INT HEMORRHOIDS    Family History  Problem  Relation Age of Onset  . Heart disease Mother   . Hypertension Mother   . Cancer Mother     cervical   . Heart attack Father   . Cancer Father     prostate  . Colon polyps Neg Hx   . Colon cancer Neg Hx     History  Substance Use Topics  . Smoking status: Never Smoker   . Smokeless tobacco: Not on file  . Alcohol Use: No    OB History    Grav Para Term Preterm Abortions TAB SAB Ect Mult Living                  Review of Systems  All other systems reviewed and are negative.    Allergies  Aspirin and Morphine  Home Medications   Current Outpatient Rx  Name Route Sig Dispense Refill  . AMLODIPINE BESYLATE 5 MG PO TABS Oral Take 10 mg by mouth daily.      . ATORVASTATIN CALCIUM 20 MG PO TABS  TAKE 1 TABLET BY MOUTH EVERY DAY 30 tablet 0  . BENZONATATE 100 MG PO CAPS Oral Take 1 capsule (100 mg total) by mouth every 6 (six) hours as needed for cough. 30 capsule 0  . CYCLOBENZAPRINE HCL 10 MG PO TABS  TAKE 1  TABLET BY MOUTH THREE TIMES DAILY 90 tablet 4  . DIAZEPAM 5 MG PO TABS  TAKE 1 TABLET BY MOUTH THREE TIMES DAILY 90 tablet 4  . FLUOXETINE HCL 20 MG PO CAPS  TAKE ONE CAPSULE BY MOUTH EVERY DAY 30 capsule 3  . FLUTICASONE PROPIONATE 50 MCG/ACT NA SUSP  USE 2 SPRAYS IN EACH NOSTRIL EVERY DAY 16 g 5  . GABAPENTIN 800 MG PO TABS  TAKE 1 TABLET BY MOUTH FOUR TIMES DAILY 120 tablet 2  . LOSARTAN POTASSIUM-HCTZ PO Oral Take by mouth. Take 1 tablet by mouth once a day d/c diovan / hctz on completion of current prescription    . LOSARTAN POTASSIUM-HCTZ 50-12.5 MG PO TABS  TAKE 1 TABLET BY MOUTH ONCE DAILY *REPLACES DIOVAN/HCTZ* 30 tablet 0  . METFORMIN HCL 1000 MG PO TABS  TAKE 1 TABLET BY MOUTH TWICE DAILY 180 tablet 0  . METFORMIN HCL 500 MG PO TABS Oral Take 1 tablet (500 mg total) by mouth 2 (two) times daily with a meal. Dose increase 180 tablet 1  . MONTELUKAST SODIUM 10 MG PO TABS  TAKE 1 TABLET BY MOUTH DAILY 30 tablet 2  . NEXIUM 40 MG PO CPDR  TAKE 1 CAPSULE BY MOUTH  EVERY DAY 30 capsule 5  . NITROGLYCERIN 0.4 MG/SPRAY TL SOLN Sublingual Place 1 spray under the tongue as directed.      . OXYCODONE HCL ER 40 MG PO TB12 Oral Take 1 tablet (40 mg total) by mouth 3 (three) times daily. 90 tablet 0  . POTASSIUM CHLORIDE CRYS CR 20 MEQ PO TBCR  TAKE 1 TABLET BY MOUTH EVERY DAY 30 tablet 5  . PROAIR HFA 108 (90 BASE) MCG/ACT IN AERS  INHALE 2 PUFFS BY MOUTH EVERY 6 TO 8 HOURS AS NEEDED 8.5 g 2  . SYMBICORT 160-4.5 MCG/ACT IN AERO  INHALE 2 PUFFS BY MOUTH TWICE DAILY 10.2 g 3  . VITAMIN D (ERGOCALCIFEROL) 50000 UNITS PO CAPS  TAKE 1 CAPSULE BY MOUTH ONCE WEEKLY 4 capsule 5  . ZOLPIDEM TARTRATE 10 MG PO TABS  TAKE 1 TABLET BY MOUTH EVERY NIGHT AT BEDTIME 30 tablet 4    BP 153/93  Pulse 74  Temp(Src) 97.7 F (36.5 C) (Oral)  Resp 22  Ht 5\' 4"  (1.626 m)  Wt 283 lb (128.368 kg)  BMI 48.58 kg/m2  SpO2 96%  Physical Exam  Nursing note and vitals reviewed. Constitutional: She is oriented to person, place, and time. She appears well-developed and well-nourished. No distress.  HENT:  Head: Normocephalic and atraumatic.  Eyes: EOM are normal.  Neck: Normal range of motion.  Cardiovascular: Normal rate, regular rhythm and normal heart sounds.   Pulmonary/Chest: Effort normal and breath sounds normal.  Abdominal: Soft. She exhibits no distension. There is no tenderness.  Musculoskeletal: Normal range of motion.  Neurological: She is alert and oriented to person, place, and time.  Skin: Skin is warm and dry.  Psychiatric: She has a normal mood and affect. Judgment normal.    ED Course  Procedures (including critical care time)  Labs Reviewed - No data to display Dg Chest 2 View  04/27/2011  *RADIOLOGY REPORT*  Clinical Data: Cough and congestion; history of asthma.  CHEST - 2 VIEW  Comparison: Chest radiograph performed 01/14/2009  Findings: The lungs are well-aerated and clear.  There is no evidence of focal opacification, pleural effusion or pneumothorax.   The heart is normal in size; the mediastinal contour is within normal limits.  No acute osseous abnormalities are seen.  IMPRESSION: No acute cardiopulmonary process seen.  Original Report Authenticated By: Tonia Ghent, M.D.     1. Bronchitis       MDM  Chest x-ray is clear.  Her cough is improved with albuterol inhaler.  I suspect this is bronchitis.  The patient is well appearing with normal vital signs and is stable for discharge and close PCP followup        Lyanne Co, MD 04/27/11 [phone]

## 2011-04-27 NOTE — ED Notes (Signed)
Patient c/o cough and congestion since last Friday; states has been getting worse since Sunday.

## 2011-04-28 ENCOUNTER — Telehealth: Payer: Self-pay | Admitting: Family Medicine

## 2011-04-28 NOTE — Telephone Encounter (Signed)
Patient states she went to the ER yesterday and they did xray and gave her an inhaler. She is coughing up thick yellow mucus and has a constant cough. They told her to follow up with her PCP. She told them you were off this week but they gave her nothing else. She is upset because she called before vacation and was advised otc meds and she knew it was going to get worse like it did before after the flu shot. Wants to be seen Monday. Per Sallie, no openings. Can we work in?

## 2011-05-01 ENCOUNTER — Encounter: Payer: Self-pay | Admitting: Family Medicine

## 2011-05-01 ENCOUNTER — Ambulatory Visit (INDEPENDENT_AMBULATORY_CARE_PROVIDER_SITE_OTHER): Payer: Medicare Other | Admitting: Family Medicine

## 2011-05-01 VITALS — BP 132/78 | HR 90 | Temp 98.1°F | Resp 18 | Ht 65.5 in | Wt 286.1 lb

## 2011-05-01 DIAGNOSIS — E119 Type 2 diabetes mellitus without complications: Secondary | ICD-10-CM

## 2011-05-01 DIAGNOSIS — I10 Essential (primary) hypertension: Secondary | ICD-10-CM

## 2011-05-01 DIAGNOSIS — J45909 Unspecified asthma, uncomplicated: Secondary | ICD-10-CM

## 2011-05-01 DIAGNOSIS — N771 Vaginitis, vulvitis and vulvovaginitis in diseases classified elsewhere: Secondary | ICD-10-CM

## 2011-05-01 MED ORDER — FLUCONAZOLE 150 MG PO TABS: ORAL_TABLET | ORAL | Status: DC

## 2011-05-01 MED ORDER — CHLORPHENIRAMINE-HYDROCODONE 8-10 MG/5ML PO LQCR: 5.0000 mL | Freq: Two times a day (BID) | ORAL | Status: DC | PRN

## 2011-05-01 MED ORDER — METHYLPREDNISOLONE ACETATE PF 80 MG/ML IJ SUSP
120.0000 mg | Freq: Once | INTRAMUSCULAR | Status: AC
  Administered 2011-05-01: 120 mg via INTRAMUSCULAR

## 2011-05-01 MED ORDER — PREDNISONE (PAK) 10 MG PO TABS: 10.0000 mg | ORAL_TABLET | Freq: Every day | ORAL | Status: AC

## 2011-05-01 NOTE — Telephone Encounter (Signed)
Pt has appt at 1:00 05/01/2011. Called and left pt a message

## 2011-05-01 NOTE — Telephone Encounter (Signed)
pls work her in this pm

## 2011-05-01 NOTE — Patient Instructions (Addendum)
F/U as before.  You are being treated for uncontrolled asthma and vaginits. You will get depo medrol 120 mg IM in the office today, and prednisone dose pack and fluconazole are prescribed, also tussionex

## 2011-05-01 NOTE — Telephone Encounter (Signed)
Luann to schedule  

## 2011-05-02 NOTE — Progress Notes (Signed)
Subjective:     [name], female   DOB: July 18, 1960, 51 y.o.   MRN: [id number]  HPI 1 week h/o nasal congestion with wheeze and cough.Pt has taken antibiotics and was seen in the Ed last week, lungs were clear. She states cough is now non productive, she did have sputum production at one time.denies recent fever or chills in the last 4 days. She has been doing home nebs on average 4 times daily for the past week  Review of Systems See HPI Denies recent fever or chills. Denies , ear pain or sore throat. Denies chest pains, palpitations and leg swelling Denies abdominal pain, nausea, vomiting,diarrhea or constipation.   Denies dysuria, frequency, hesitancy or incontinence.c/o vaginal itch and white d/c with recent antibiotic use Denies joint pain, swelling and limitation in mobility. Denies headaches, seizures, numbness, or tingling. Denies depression, anxiety or insomnia. Denies skin break down or rash.        Objective:   Physical Exam Patient alert and oriented and in no cardiopulmonary distress.  HEENT: No facial asymmetry, EOMI, no sinus tenderness,  oropharynx pink and moist.  Neck supple no adenopathy.  Chest: decreased air entry, bilateral wheezes, no crackles  CVS: S1, S2 no murmurs, no S3.  ABD: Soft non tender. Bowel sounds normal. Pelvic: not done Ext: No edema  MS: Adequate ROM spine, shoulders, hips and knees.  Skin: Intact, no ulcerations or rash noted.  Psych: Good eye contact, normal affect. Memory intact not anxious or depressed appearing.  CNS: CN 2-12 intact, power, tone and sensation normal throughout.     Assessment:         Plan:

## 2011-05-02 NOTE — Assessment & Plan Note (Signed)
Uncontrolled with acute exacerbation, aggressive steroid therapy

## 2011-05-02 NOTE — Assessment & Plan Note (Signed)
Vaginal candidiasis by history, presumptive treatment

## 2011-05-02 NOTE — Assessment & Plan Note (Signed)
Controlled, no change in medication  

## 2011-05-03 ENCOUNTER — Telehealth: Payer: Self-pay | Admitting: Family Medicine

## 2011-05-03 ENCOUNTER — Other Ambulatory Visit: Payer: Self-pay | Admitting: Family Medicine

## 2011-05-03 MED ORDER — IPRATROPIUM-ALBUTEROL 0.5-2.5 (3) MG/3ML IN SOLN: 3.0000 mL | Freq: Four times a day (QID) | RESPIRATORY_TRACT | Status: DC | PRN

## 2011-05-03 NOTE — Telephone Encounter (Signed)
No neb liquid is on her medlist. Please let me know what to send in

## 2011-05-03 NOTE — Telephone Encounter (Signed)
Med has been sent in to her pharmacy and she is aware

## 2011-05-24 ENCOUNTER — Other Ambulatory Visit: Payer: Self-pay | Admitting: Family Medicine

## 2011-05-25 ENCOUNTER — Other Ambulatory Visit: Payer: Self-pay

## 2011-05-25 MED ORDER — OXYCODONE HCL 40 MG PO TB12: 40.0000 mg | ORAL_TABLET | Freq: Three times a day (TID) | ORAL | Status: DC

## 2011-06-16 ENCOUNTER — Other Ambulatory Visit: Payer: Self-pay

## 2011-06-16 MED ORDER — OXYCODONE HCL 40 MG PO TB12: 40.0000 mg | ORAL_TABLET | Freq: Three times a day (TID) | ORAL | Status: DC

## 2011-06-19 ENCOUNTER — Other Ambulatory Visit: Payer: Self-pay | Admitting: Family Medicine

## 2011-06-19 ENCOUNTER — Telehealth: Payer: Self-pay | Admitting: Family Medicine

## 2011-06-19 DIAGNOSIS — N771 Vaginitis, vulvitis and vulvovaginitis in diseases classified elsewhere: Secondary | ICD-10-CM

## 2011-06-19 MED ORDER — DOXYCYCLINE HYCLATE 100 MG PO TABS: 100.0000 mg | ORAL_TABLET | Freq: Two times a day (BID) | ORAL | Status: AC

## 2011-06-19 MED ORDER — FLUCONAZOLE 150 MG PO TABS: ORAL_TABLET | ORAL | Status: DC

## 2011-06-19 NOTE — Telephone Encounter (Signed)
Patient aware.

## 2011-06-19 NOTE — Telephone Encounter (Signed)
For swollen tender nodes she needs an antibiotic to treat this , an x ray will not show the swollen glands, xrays show bones, I wioll presumptively send in a course of antibiotic and see how she is next week, alos diflucan

## 2011-06-19 NOTE — Telephone Encounter (Signed)
Went to Dr Donzetta Matters. Has a lymph node in her right armpit. Did mamogram at [facility],  no cancer. [place] states the lymph nodes appeared swollen and they are very sore. Wants an xray because she is coming in next week and wants the results to be back.

## 2011-06-20 ENCOUNTER — Telehealth: Payer: Self-pay | Admitting: Family Medicine

## 2011-06-20 ENCOUNTER — Other Ambulatory Visit: Payer: Self-pay

## 2011-06-20 MED ORDER — METFORMIN HCL 500 MG PO TABS: 500.0000 mg | ORAL_TABLET | Freq: Two times a day (BID) | ORAL | Status: DC

## 2011-06-20 NOTE — Telephone Encounter (Signed)
Called for form to be faxed to my attention

## 2011-06-20 NOTE — Telephone Encounter (Signed)
Cell # [phone] if no answer at home

## 2011-06-20 NOTE — Telephone Encounter (Signed)
Patient aware that forms have been sent back and no response back from them yet

## 2011-06-21 ENCOUNTER — Telehealth: Payer: Self-pay

## 2011-06-21 ENCOUNTER — Ambulatory Visit (INDEPENDENT_AMBULATORY_CARE_PROVIDER_SITE_OTHER): Payer: Medicare Other

## 2011-06-21 ENCOUNTER — Telehealth: Payer: Self-pay | Admitting: Family Medicine

## 2011-06-21 VITALS — BP 130/82 | Wt 284.0 lb

## 2011-06-21 DIAGNOSIS — M545 Low back pain: Secondary | ICD-10-CM

## 2011-06-21 MED ORDER — KETOROLAC TROMETHAMINE 60 MG/2ML IM SOLN
60.0000 mg | Freq: Once | INTRAMUSCULAR | Status: AC
  Administered 2011-06-21: 60 mg via INTRAMUSCULAR

## 2011-06-21 MED ORDER — METHYLPREDNISOLONE ACETATE PF 80 MG/ML IJ SUSP
80.0000 mg | Freq: Once | INTRAMUSCULAR | Status: AC
  Administered 2011-06-21: 80 mg via INTRAMUSCULAR

## 2011-06-21 NOTE — Telephone Encounter (Signed)
Called crying in pain wanting a pain injection. She has been out of her meds since Monday. Still no word from the insurance on status of claim. Advised to come on here for [place]? What injection can she get?

## 2011-06-21 NOTE — Progress Notes (Signed)
Patient received toradol 60 and depo medrol 80 per Dr Lodema Hong without any complications. Offered to push down in the wheelchair but pt refused.

## 2011-06-21 NOTE — Telephone Encounter (Signed)
pls give toradol 60mg  and depomedrol 80 mg Im

## 2011-06-22 NOTE — Telephone Encounter (Signed)
Pt came and received injections

## 2011-06-22 NOTE — Telephone Encounter (Signed)
Called and left message to call back.

## 2011-06-22 NOTE — Telephone Encounter (Signed)
Angela [name] spoke with someone yesterday from [facility] and the med was approved

## 2011-06-29 ENCOUNTER — Encounter: Payer: Self-pay | Admitting: Family Medicine

## 2011-06-29 ENCOUNTER — Other Ambulatory Visit: Payer: Self-pay | Admitting: Family Medicine

## 2011-06-29 ENCOUNTER — Ambulatory Visit (INDEPENDENT_AMBULATORY_CARE_PROVIDER_SITE_OTHER): Payer: Medicare Other | Admitting: Family Medicine

## 2011-06-29 DIAGNOSIS — I10 Essential (primary) hypertension: Secondary | ICD-10-CM

## 2011-06-29 DIAGNOSIS — E119 Type 2 diabetes mellitus without complications: Secondary | ICD-10-CM

## 2011-06-29 DIAGNOSIS — M545 Low back pain: Secondary | ICD-10-CM

## 2011-06-29 DIAGNOSIS — J45909 Unspecified asthma, uncomplicated: Secondary | ICD-10-CM

## 2011-06-29 DIAGNOSIS — E669 Obesity, unspecified: Secondary | ICD-10-CM

## 2011-06-29 MED ORDER — MONTELUKAST SODIUM 10 MG PO TABS: ORAL_TABLET | ORAL | Status: DC

## 2011-06-29 MED ORDER — GABAPENTIN 800 MG PO TABS: ORAL_TABLET | ORAL | Status: DC

## 2011-06-29 NOTE — Patient Instructions (Signed)
F/ in 6 weeks.  Toradol later today for back pain.  Please call for referral for psychology to counsel if you overwhelmed   pls get labs as soon possible.  Accept my condolence in your recent loss

## 2011-07-02 NOTE — Assessment & Plan Note (Signed)
Uncontrolled at this visit, likely due to stress and pain, no med changes

## 2011-07-02 NOTE — Assessment & Plan Note (Signed)
Currently stable, no recent flare 

## 2011-07-02 NOTE — Assessment & Plan Note (Signed)
Updated labs due, pt encouraged to work harder on dietary modification for blood sugar control

## 2011-07-02 NOTE — Assessment & Plan Note (Signed)
Deteriorated. Patient re-educated about  the importance of commitment to a  minimum of 150 minutes of exercise per week. The importance of healthy food choices with portion control discussed. Encouraged to start a food diary, count calories and to consider  joining a support group. Sample diet sheets offered. Goals set by the patient for the next several months.    
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
## 2011-07-02 NOTE — Assessment & Plan Note (Signed)
Uncontrolled with flare, pt to return for toradol injection

## 2011-07-03 ENCOUNTER — Ambulatory Visit (INDEPENDENT_AMBULATORY_CARE_PROVIDER_SITE_OTHER): Payer: Medicare Other

## 2011-07-03 ENCOUNTER — Telehealth: Payer: Self-pay

## 2011-07-03 VITALS — BP 130/80 | Ht 65.5 in | Wt 289.0 lb

## 2011-07-03 DIAGNOSIS — M545 Low back pain: Secondary | ICD-10-CM

## 2011-07-03 DIAGNOSIS — M549 Dorsalgia, unspecified: Secondary | ICD-10-CM

## 2011-07-03 MED ORDER — KETOROLAC TROMETHAMINE 60 MG/2ML IM SOLN
60.0000 mg | Freq: Once | INTRAMUSCULAR | Status: AC
  Administered 2011-07-03: 60 mg via INTRAMUSCULAR

## 2011-07-03 NOTE — Telephone Encounter (Signed)
As she stays off her feet, elevates them and wears thick stocking for a few days this should help. I will not prescribe compression hose now. The fact that they do go down is reassuring.

## 2011-07-03 NOTE — Progress Notes (Signed)
Pt received toradol injection with no complications

## 2011-07-03 NOTE — Telephone Encounter (Signed)
Wanted me to let you know she has been having blue places come up on her lower right leg occasionally. In the shin area (Looked like puffy veins) and she applies warm compress to them and they go down. Wants to know what she needs to do about them, if anything

## 2011-07-04 NOTE — Telephone Encounter (Signed)
Patient aware.

## 2011-07-10 ENCOUNTER — Ambulatory Visit: Payer: Medicare Other

## 2011-07-11 ENCOUNTER — Encounter (HOSPITAL_COMMUNITY): Payer: Self-pay

## 2011-07-11 ENCOUNTER — Emergency Department (HOSPITAL_COMMUNITY)
Admission: EM | Admit: 2011-07-11 | Discharge: 2011-07-11 | Disposition: A | Discharge status: Home / Self Care | Payer: Medicare Other | Attending: Emergency Medicine | Admitting: Emergency Medicine

## 2011-07-11 DIAGNOSIS — M62838 Other muscle spasm: Secondary | ICD-10-CM

## 2011-07-11 DIAGNOSIS — J45909 Unspecified asthma, uncomplicated: Secondary | ICD-10-CM | POA: Insufficient documentation

## 2011-07-11 DIAGNOSIS — G8929 Other chronic pain: Secondary | ICD-10-CM | POA: Insufficient documentation

## 2011-07-11 DIAGNOSIS — M549 Dorsalgia, unspecified: Secondary | ICD-10-CM | POA: Insufficient documentation

## 2011-07-11 DIAGNOSIS — M5380 Other specified dorsopathies, site unspecified: Secondary | ICD-10-CM | POA: Insufficient documentation

## 2011-07-11 DIAGNOSIS — R252 Cramp and spasm: Secondary | ICD-10-CM | POA: Insufficient documentation

## 2011-07-11 DIAGNOSIS — E0590 Thyrotoxicosis, unspecified without thyrotoxic crisis or storm: Secondary | ICD-10-CM | POA: Insufficient documentation

## 2011-07-11 DIAGNOSIS — E119 Type 2 diabetes mellitus without complications: Secondary | ICD-10-CM | POA: Insufficient documentation

## 2011-07-11 DIAGNOSIS — I10 Essential (primary) hypertension: Secondary | ICD-10-CM | POA: Insufficient documentation

## 2011-07-11 LAB — POCT I-STAT, CHEM 8
Calcium, Ion: 1.21 mmol/L (ref 1.12–1.32)
Glucose, Bld: 107 mg/dL — ABNORMAL HIGH (ref 70–99)
HCT: 37 % (ref 36.0–46.0)
Hemoglobin: 12.6 g/dL (ref 12.0–15.0)
Potassium: 4.1 mEq/L (ref 3.5–5.1)
TCO2: 27 mmol/L (ref 0–100)

## 2011-07-11 MED ORDER — FENTANYL CITRATE 0.05 MG/ML IJ SOLN
100.0000 ug | Freq: Once | INTRAMUSCULAR | Status: AC
  Administered 2011-07-11: 100 ug via INTRAMUSCULAR
  Filled 2011-07-11: qty 2

## 2011-07-11 MED ORDER — HYDROMORPHONE HCL PF 1 MG/ML IJ SOLN
1.0000 mg | Freq: Once | INTRAMUSCULAR | Status: AC
  Administered 2011-07-11: 1 mg via INTRAVENOUS
  Filled 2011-07-11: qty 1

## 2011-07-11 MED ORDER — DIAZEPAM 5 MG PO TABS
5.0000 mg | ORAL_TABLET | Freq: Once | ORAL | Status: AC
  Administered 2011-07-11: 5 mg via ORAL
  Filled 2011-07-11: qty 1

## 2011-07-11 MED ORDER — SODIUM CHLORIDE 0.9 % IV BOLUS (SEPSIS)
1000.0000 mL | Freq: Once | INTRAVENOUS | Status: AC
  Administered 2011-07-11: 1000 mL via INTRAVENOUS

## 2011-07-11 NOTE — ED Notes (Signed)
Pt reports generalized cramping for past 1 1/2 weeks.  Says has history of hypokalemia and takes potassium and also has muscle relaxers but says nothing is helping.  Saw Dr. Lodema Hong on feb 28th and  was given  A toradol injection but didn't help.   Pt restless.

## 2011-07-11 NOTE — ED Notes (Signed)
Pt reporting improvement in pain level following administration of Dilaudid.

## 2011-07-11 NOTE — ED Provider Notes (Signed)
History     CSN: [id number]  Arrival date & time 07/11/11  1727   First MD Initiated Contact with Patient 07/11/11 1759      Chief Complaint  Patient presents with  . Back Pain  . Spasms    HPI Pt was seen at 1820.  Per pt, c/o gradual onset and persistence of constant acute flair of her chronic back "pain" for the past 1 to 2 weeks.  Denies any change in her usual chronic pain pattern.  Denies incont/retention of bowel or bladder, no saddle anesthesia, no focal motor weakness, no tingling/numbness in extremities, no fevers, no injury.   The symptoms have been associated with "muscle cramps" in her back and legs for the past 1 to 2 weeks. Pt was eval by her PMD on 2/20, 2/28, and 07/03/11 for same, was given IM toradol and potassium without relief.  The patient has a significant history of similar symptoms previously, recently being evaluated for this complaint and multiple prior evals for same.      Past Medical History  Diagnosis Date  . Low back pain   . Hyperthyroidism   . Hypertension   . DM type 2 (diabetes mellitus, type 2)   . Depression   . Asthma   . Anxiety   . Helicobacter pylori gastritis 2008  . Severe obesity (BMI >= 40) NOV 2008 270 LBS  . Hypokalemia     Past Surgical History  Procedure Date  . Cholecystectomy   . Left knee arthroscopic surgery 1999  . Back surgery 1993  . Back surgery 1994  . Back surgery 2002  . Left salphingectomy secondary to ectopic pregnancy 1991  . Partial hysterectomy 1991  . Back surgery 2011    [facility]   . Knee arthroscopy 10/04/2010,     right knee arthroscopy, dr Thomasena Edis  . Upper gastrointestinal endoscopy 2008 abd pain    H. pylori gastritis  . [place] gastrointestinal endoscopy [place] [facility] [place]    [facility]  . Colonoscopy 2000 [facility]    INT HEMORRHOIDS/FISSURE  . Colonoscopy 2003 [facility], CHANGE IN BOWEL HABITS    INT HEMORRHOIDS  . Colonoscopy 2006 [facility]    INT HEMORRHOIDS  . Colonoscopy 2007 [facility]    INT HEMORRHOIDS     Family History  Problem Relation Age of Onset  . Heart disease Mother   . Hypertension Mother   . Cancer Mother     cervical   . Heart attack Father   . Cancer Father     prostate  . Colon polyps Neg Hx   . Colon cancer Neg Hx     History  Substance Use Topics  . Smoking status: Never Smoker   . Smokeless tobacco: Not on file  . Alcohol Use: No   Review of Systems ROS: Statement: All systems negative except as marked or noted in the HPI; Constitutional: Negative for fever and chills. ; ; Eyes: Negative for eye pain, redness and discharge. ; ; ENMT: Negative for ear pain, hoarseness, nasal congestion, sinus pressure and sore throat. ; ; Cardiovascular: Negative for chest pain, palpitations, diaphoresis, dyspnea and peripheral edema. ; ; Respiratory: Negative for cough, wheezing and stridor. ; ; Gastrointestinal: Negative for nausea, vomiting, diarrhea, abdominal pain, blood in stool, hematemesis, jaundice and rectal bleeding. . ; ; Genitourinary: Negative for dysuria, flank pain and hematuria. ; ; Musculoskeletal: +back pain, muscle cramping. Negative for neck pain. Negative for swelling and trauma.; ; Skin: Negative for pruritus, rash, abrasions, blisters, bruising  and skin lesion.; ; Neuro: Negative for headache, lightheadedness and neck stiffness. Negative for weakness, altered level of consciousness , altered mental status, extremity weakness, paresthesias, involuntary movement, seizure and syncope.     Allergies  Aspirin and Morphine  Home Medications   Current Outpatient Rx  Name Route Sig Dispense Refill  . AMLODIPINE BESYLATE 5 MG PO TABS Oral Take 10 mg by mouth daily.      . ATORVASTATIN CALCIUM 20 MG PO TABS  TAKE 1 TABLET BY MOUTH EVERY DAY 30 tablet 0  . BENZONATATE 100 MG PO CAPS Oral Take 1 capsule (100 mg total) by mouth every 6 (six) hours as needed for cough. 30 capsule 0  . CHLORPHENIRAMINE-HYDROCODONE 8-10 MG/5ML PO LQCR Oral Take 5 mLs by mouth every 12  (twelve) hours as needed for cough. 180 mL 0  . CYCLOBENZAPRINE HCL 10 MG PO TABS  TAKE 1 TABLET BY MOUTH THREE TIMES DAILY 90 tablet 4  . DIAZEPAM 5 MG PO TABS  TAKE 1 TABLET BY MOUTH THREE TIMES DAILY 90 tablet 4  . FLUCONAZOLE 150 MG PO TABS  One tablet once daily, as needed for vaginal itch 3 tablet 0  . FLUOXETINE HCL 20 MG PO CAPS  TAKE ONE CAPSULE BY MOUTH EVERY DAY 30 capsule 3  . FLUTICASONE PROPIONATE 50 MCG/ACT NA SUSP  USE 2 SPRAYS IN EACH NOSTRIL EVERY DAY 16 g 5  . GABAPENTIN 800 MG PO TABS  TAKE 1 TABLET BY MOUTH FOUR TIMES DAILY 120 tablet 4  . IPRATROPIUM-ALBUTEROL 0.5-2.5 (3) MG/3ML IN SOLN Nebulization Take 3 mLs by nebulization every 6 (six) hours as needed. 360 mL 3  . LOSARTAN POTASSIUM-HCTZ PO Oral Take by mouth. Take 1 tablet by mouth once a day d/c diovan / hctz on completion of current prescription    . LOSARTAN POTASSIUM-HCTZ 50-12.5 MG PO TABS  TAKE 1 TABLET BY MOUTH ONCE DAILY *REPLACES DIOVAN/HCTZ* 30 tablet 0  . METFORMIN HCL 1000 MG PO TABS  TAKE 1 TABLET BY MOUTH TWICE DAILY 180 tablet 1  . MONTELUKAST SODIUM 10 MG PO TABS  TAKE 1 TABLET BY MOUTH DAILY 30 tablet 4  . NEXIUM 40 MG PO CPDR  TAKE 1 CAPSULE BY MOUTH EVERY DAY 30 capsule 5  . NITROGLYCERIN 0.4 MG/SPRAY TL SOLN Sublingual Place 1 spray under the tongue as directed.      . OXYCODONE HCL ER 40 MG PO TB12 Oral Take 1 tablet (40 mg total) by mouth 3 (three) times daily. 90 tablet 0  . POTASSIUM CHLORIDE CRYS ER 20 MEQ PO TBCR  TAKE 1 TABLET BY MOUTH EVERY DAY 30 tablet 5  . PROAIR HFA 108 (90 BASE) MCG/ACT IN AERS  INHALE 2 PUFFS BY MOUTH EVERY 6 TO 8 HOURS AS NEEDED 8.5 g 2  . SYMBICORT 160-4.5 MCG/ACT IN AERO  INHALE 2 PUFFS BY MOUTH TWICE DAILY 10.2 g 3  . VALSARTAN-HYDROCHLOROTHIAZIDE 80-12.5 MG PO TABS  TAKE ONE TABLET BY MOUTH DAILY 30 tablet 1  . VITAMIN D (ERGOCALCIFEROL) 50000 UNITS PO CAPS  TAKE 1 CAPSULE BY MOUTH ONCE WEEKLY 4 capsule 5  . ZOLPIDEM TARTRATE 10 MG PO TABS  TAKE 1 TABLET BY MOUTH  EVERY NIGHT AT BEDTIME 30 tablet 4    BP 138/79  Pulse 85  Temp(Src) 97.6 F (36.4 C) (Oral)  Resp 18  Ht 5\' 5"  (1.651 m)  Wt 283 lb (128.368 kg)  BMI 47.09 kg/m2  SpO2 100%  Physical Exam 1825: Physical examination:  Nursing notes reviewed; Vital signs and O2 SAT reviewed;  Constitutional: Well developed, Well nourished, Well hydrated, Uncomfortable appearing; Head:  Normocephalic, atraumatic; Eyes: EOMI, PERRL, No scleral icterus; ENMT: Mouth and pharynx normal, Mucous membranes moist; Neck: Supple, Full range of motion, No lymphadenopathy; Cardiovascular: Regular rate and rhythm, No murmur, rub, or gallop; Respiratory: Breath sounds clear & equal bilaterally, No rales, rhonchi, wheezes, or rub, Normal respiratory effort/excursion; Chest: Nontender, Movement normal; Abdomen: Soft, Nontender, Nondistended, Normal bowel sounds; Genitourinary: No CVA tenderness; Spine:  No midline CS, TS, LS tenderness. +TTP bilat lumbar paraspinal muscles; Extremities: Pulses normal, No tenderness, No edema, No calf edema or asymmetry.; Neuro: AA&Ox3, Marmol CN grossly intact.  No gross focal motor or sensory deficits in extremities.; Skin: Color normal, Warm, Dry, no rash.    ED Course  Procedures   8:52 PM:  Pt continues to c/o back muscles "cramping."  IM and PO meds "not helping."  Pt states she is "stiffening up."  Will start IV for IVF and meds.    10:16 PM:  Appears more comfortable now.  Feels improved after meds and IVF and wants to go home now.  Pt endorses she has valium and OxyContin at home.  Dx testing d/w pt.  Questions answered.  Verb understanding, agreeable to d/c home with outpt f/u.    MDM  MDM Reviewed: nursing note and vitals Interpretation: labs   Results for orders placed during the hospital encounter of 07/11/11  POCT I-STAT, CHEM 8      Component Value Range   Sodium 141  135 - 145 (mEq/L)   Potassium 4.1  3.5 - 5.1 (mEq/L)   Chloride 104  96 - 112 (mEq/L)   BUN 17  6 -  23 (mg/dL)   Creatinine, Ser 0.45  0.50 - 1.10 (mg/dL)   Glucose, Bld 409 (*) 70 - 99 (mg/dL)   Calcium, Ion 8.11  9.14 - 1.32 (mmol/L)   TCO2 27  0 - 100 (mmol/L)   Hemoglobin 12.6  12.0 - 15.0 (g/dL)   HCT 78.2  95.6 - 21.3 (%)  CK      Component Value Range   Total CK 742 (*) 7 - 177 (U/L)              Laray Anger, DO 07/13/11 1517

## 2011-07-14 ENCOUNTER — Telehealth: Payer: Self-pay | Admitting: Gastroenterology

## 2011-07-14 ENCOUNTER — Encounter: Payer: Self-pay | Admitting: Gastroenterology

## 2011-07-14 NOTE — Telephone Encounter (Signed)
Pt is on March recall to follow up with SF and to have CBC done prior to OV

## 2011-07-17 ENCOUNTER — Other Ambulatory Visit: Payer: Self-pay

## 2011-07-17 ENCOUNTER — Encounter: Payer: Self-pay | Admitting: Gastroenterology

## 2011-07-17 ENCOUNTER — Telehealth: Payer: Self-pay | Admitting: Gastroenterology

## 2011-07-17 MED ORDER — OXYCODONE HCL 40 MG PO TB12: 40.0000 mg | ORAL_TABLET | Freq: Three times a day (TID) | ORAL | Status: DC

## 2011-07-17 NOTE — Telephone Encounter (Signed)
March recall list has that patient needs repeat CBC prior to OV in 6 months

## 2011-07-18 ENCOUNTER — Other Ambulatory Visit: Payer: Self-pay

## 2011-07-18 ENCOUNTER — Telehealth: Payer: Self-pay | Admitting: Family Medicine

## 2011-07-18 DIAGNOSIS — K625 Hemorrhage of anus and rectum: Secondary | ICD-10-CM

## 2011-07-18 MED ORDER — PANTOPRAZOLE SODIUM 40 MG PO TBEC: 40.0000 mg | DELAYED_RELEASE_TABLET | Freq: Every day | ORAL | Status: DC

## 2011-07-18 NOTE — Telephone Encounter (Signed)
Med sent in.

## 2011-07-18 NOTE — Telephone Encounter (Signed)
Not sure what they will cover, if you can get a list, best, but if not start with pantoprazole 40mg  one daily #30 refill 2 pls

## 2011-07-18 NOTE — Telephone Encounter (Signed)
Dr. Darrick Penna, do you need CBC on this pt prior to OV?

## 2011-07-18 NOTE — Telephone Encounter (Signed)
Pt aware and CBC order faxed to [facility].

## 2011-07-18 NOTE — Telephone Encounter (Signed)
Pt is aware that Lab order has been faxed to [facility] and she needs to do prior to her OV.

## 2011-07-25 ENCOUNTER — Other Ambulatory Visit: Payer: Self-pay | Admitting: Family Medicine

## 2011-08-03 ENCOUNTER — Encounter: Payer: Self-pay | Admitting: Gastroenterology

## 2011-08-03 NOTE — Telephone Encounter (Signed)
Pt is aware of OV on 4/17 at 0845 with [facility] and appt card was mailed.

## 2011-08-10 ENCOUNTER — Ambulatory Visit: Payer: Medicare Other | Admitting: Family Medicine

## 2011-08-11 ENCOUNTER — Other Ambulatory Visit: Payer: Self-pay

## 2011-08-11 MED ORDER — OXYCODONE HCL 40 MG PO TB12: 40.0000 mg | ORAL_TABLET | Freq: Three times a day (TID) | ORAL | Status: DC

## 2011-08-15 ENCOUNTER — Other Ambulatory Visit: Payer: Self-pay | Admitting: Gastroenterology

## 2011-08-15 ENCOUNTER — Telehealth: Payer: Self-pay | Admitting: Family Medicine

## 2011-08-15 ENCOUNTER — Encounter: Payer: Self-pay | Admitting: Gastroenterology

## 2011-08-15 DIAGNOSIS — I10 Essential (primary) hypertension: Secondary | ICD-10-CM

## 2011-08-15 DIAGNOSIS — E785 Hyperlipidemia, unspecified: Secondary | ICD-10-CM

## 2011-08-15 NOTE — Telephone Encounter (Signed)
Labs faxed

## 2011-08-16 ENCOUNTER — Ambulatory Visit (INDEPENDENT_AMBULATORY_CARE_PROVIDER_SITE_OTHER): Payer: Medicare Other | Admitting: Gastroenterology

## 2011-08-16 ENCOUNTER — Encounter: Payer: Self-pay | Admitting: Gastroenterology

## 2011-08-16 VITALS — BP 129/82 | HR 77 | Temp 98.0°F | Ht 64.0 in | Wt 283.8 lb

## 2011-08-16 DIAGNOSIS — K219 Gastro-esophageal reflux disease without esophagitis: Secondary | ICD-10-CM

## 2011-08-16 DIAGNOSIS — K625 Hemorrhage of anus and rectum: Secondary | ICD-10-CM

## 2011-08-16 LAB — HEMOGLOBIN A1C
Hgb A1c MFr Bld: 6.2 % — ABNORMAL HIGH (ref <5.7)
Mean Plasma Glucose: 131 mg/dL — ABNORMAL HIGH (ref <117)

## 2011-08-16 LAB — COMPREHENSIVE METABOLIC PANEL
Albumin: 4.2 g/dL (ref 3.5–5.2)
BUN: 13 mg/dL (ref 6–23)
CO2: 28 mEq/L (ref 19–32)
Glucose, Bld: 99 mg/dL (ref 70–99)
Potassium: 3.7 mEq/L (ref 3.5–5.3)
Sodium: 138 mEq/L (ref 135–145)
Total Bilirubin: 0.3 mg/dL (ref 0.3–1.2)
Total Protein: 7.2 g/dL (ref 6.0–8.3)

## 2011-08-16 LAB — LIPID PANEL
Cholesterol: 159 mg/dL (ref 0–200)
HDL: 43 mg/dL (ref >39)
Total CHOL/HDL Ratio: 3.7 Ratio

## 2011-08-16 LAB — CBC WITH DIFFERENTIAL/PLATELET
Basophils Absolute: 0 10*3/uL (ref 0.0–0.1)
Basophils Relative: 0 % (ref 0–1)
Lymphocytes Relative: 38 % (ref 12–46)
MCHC: 31.7 g/dL (ref 30.0–36.0)
Neutro Abs: 3 10*3/uL (ref 1.7–7.7)
Neutrophils Relative %: 56 % (ref 43–77)
RDW: 15 % (ref 11.5–15.5)
WBC: 5.4 10*3/uL (ref 4.0–10.5)

## 2011-08-16 NOTE — Assessment & Plan Note (Signed)
SX NOT IDEALLY CONTROLLED WITH PROTONIX DAILY.  PT DECLINED TO INCREASE PROTONIX TO BID. WILL DISCUSS WITH DR. Lodema Hong. SX BETTER CONTROLLED ON NEXIUM. CONTINUE WEIGHT LOSS. DIETARY ADHERENCE ENCOURAGED.

## 2011-08-16 NOTE — Patient Instructions (Signed)
YOUR RECTAL BLEEDING IS FROM HEMORRHOIDS.  CONTINUE WEIGHT LOSS EFFORTS. AVOID CONSTIPATION OR STARINING. FOLLOW UP IN 6 MOS.

## 2011-08-16 NOTE — Progress Notes (Signed)
  Subjective:    [name], female    DOB: Jun 23, 1960, 51 y.o.   MRN: [id number]  PCP: [facility]  HPI C/O RECTAL BLEEDING.PT HAS HAD AT LEAST 4 TCS SINCE 2000 FOR RECTAL BLEEDING. CAUSE IDENTIFIED AS IH. WORKS [employer] IN [place]. SITS A LOOKS AT THE MONITORS. NO HEAVYLIFTING. BMS: Q1-3 DAYS, SOFT. NO STRAINING.  Past Medical History  Diagnosis Date  . Low back pain   . Hyperthyroidism   . Hypertension   . DM type 2 (diabetes mellitus, type 2)   . Depression   . Asthma   . Anxiety   . Helicobacter pylori gastritis 2008  . Severe obesity (BMI >= 40) NOV 2008 270 LBS  . Hypokalemia    Past Surgical History  Procedure Date  . Cholecystectomy   . Left knee arthroscopic surgery 1999  . Back surgery 1993  . Back surgery 1994  . Back surgery 2002  . Left salphingectomy secondary to ectopic pregnancy 1991  . Partial hysterectomy 1991  . Back surgery 2011    [facility]   . Knee arthroscopy 10/04/2010,     right knee arthroscopy, dr Thomasena Edis  . Upper gastrointestinal endoscopy 2008 abd pain    H. pylori gastritis  . [place] gastrointestinal endoscopy [place] [facility] [place]    [facility]  . Colonoscopy 2000 [facility]    INT HEMORRHOIDS/FISSURE  . Colonoscopy 2003 [facility], CHANGE IN BOWEL HABITS    INT HEMORRHOIDS  . Colonoscopy 2006 [facility]    INT HEMORRHOIDS  . Colonoscopy 2007 [facility]    INT HEMORRHOIDS  . Esophagogastroduodenoscopy 2008    Small hiatal hernia./Normal esophagus without evidence of Barrett's mass, stricture, erosion or ulceration./Normal duodenal bulb and second portion of the duodenum./Diffuse erythema in the body and the antrum with occasional erosion.  Biopsies obtained via cold forceps to evaluate for H. pylori gastritis      Review of Systems     Objective:   Physical Exam  Vitals reviewed. Constitutional: She is oriented to person, place, and time. She appears well-developed. No distress.  HENT:  Head: Normocephalic and atraumatic.  Mouth/Throat:  Oropharynx is clear and moist. No oropharyngeal exudate.  Eyes: Pupils are equal, round, and reactive to light. No scleral icterus.  Neck: Normal range of motion. Neck supple.  Cardiovascular: Normal rate, regular rhythm and normal heart sounds.   Pulmonary/Chest: Effort normal and breath sounds normal.  Abdominal: Soft. Bowel sounds are normal. She exhibits no distension. There is no tenderness.       OBESE   Musculoskeletal: She exhibits no edema.  Neurological: She is alert and oriented to person, place, and time.       NO  NEW FOCAL DEFICITS           Assessment & Plan:

## 2011-08-16 NOTE — Progress Notes (Signed)
Faxed to PCP

## 2011-08-16 NOTE — Progress Notes (Signed)
Reminder in epic to follow up in 6 months °

## 2011-08-16 NOTE — Assessment & Plan Note (Signed)
RECTAL BLEEDING FROM HEMORRHOIDS.  CONTINUE WEIGHT LOSS EFFORTS. AVOID CONSTIPATION OR STARINING. PT DECLINED TO USE PREP H OR X HEMORRHOID CREAM/SUPP. FOLLOW UP IN 6 MOS

## 2011-08-17 ENCOUNTER — Encounter: Payer: Self-pay | Admitting: Family Medicine

## 2011-08-17 ENCOUNTER — Ambulatory Visit (INDEPENDENT_AMBULATORY_CARE_PROVIDER_SITE_OTHER): Payer: Medicare Other | Admitting: Family Medicine

## 2011-08-17 VITALS — BP 128/80 | HR 83 | Resp 18 | Ht 65.5 in | Wt 285.1 lb

## 2011-08-17 DIAGNOSIS — E785 Hyperlipidemia, unspecified: Secondary | ICD-10-CM

## 2011-08-17 DIAGNOSIS — I10 Essential (primary) hypertension: Secondary | ICD-10-CM

## 2011-08-17 DIAGNOSIS — E119 Type 2 diabetes mellitus without complications: Secondary | ICD-10-CM

## 2011-08-17 DIAGNOSIS — R5383 Other fatigue: Secondary | ICD-10-CM

## 2011-08-17 DIAGNOSIS — J45909 Unspecified asthma, uncomplicated: Secondary | ICD-10-CM

## 2011-08-17 DIAGNOSIS — E669 Obesity, unspecified: Secondary | ICD-10-CM

## 2011-08-17 NOTE — Progress Notes (Signed)
  Subjective:    [name], female    DOB: [birth year], 51 y.o.   MRN: [id number]  HPI The PT is here for follow up and re-evaluation of chronic medical conditions, medication management and review of any available recent lab and radiology data.  Preventive health is updated, specifically  Cancer screening and Immunization.   Questions or concerns regarding consultations or procedures which the PT has had in the interim are  addressed. The PT denies any adverse reactions to current medications since the last visit.  States she is getting 2nd opinion from a GI doc at the suggestion of her gyne. She has  a fam h/o colon ca under age 72 when first diagnosed,and reports that her last colonscopy was 5 years ago. Mother had ovarian cancer and she follows with gyne. Currently under increased stress due to relationship with her daughter being strained. Denies polyuria, polydipsia, blued vision or hypoglycemic episodes      Review of Systems See HPI Denies recent fever or chills. Denies sinus pressure, nasal congestion, ear pain or sore throat. Denies chest congestion, productive cough or wheezing. Denies chest pains, palpitations and leg swelling Denies abdominal pain, nausea, vomiting,diarrhea or constipation.   Denies dysuria, frequency, hesitancy or incontinence. Chronic  joint pain,  and limitation in mobility. Denies headaches, seizures, numbness, or tingling. . Denies skin break down or rash.        Objective:   Physical Exam Patient alert and oriented and in no cardiopulmonary distress.  HEENT: No facial asymmetry, EOMI, no sinus tenderness,  oropharynx pink and moist.  Neck supple no adenopathy.  Chest: Clear to auscultation bilaterally.  CVS: S1, S2 no murmurs, no S3.  ABD: Soft non tender. Bowel sounds normal.  Ext: No edema  MS: decreased  ROM spine.  Skin: Intact, no ulcerations or rash noted.  Psych: Good eye contact, normal affect. Memory intact  not anxious but mildly tearful and  depressed appearing.  CNS: CN 2-12 intact, power, tone and sensation normal throughout.        Assessment & Plan:

## 2011-08-17 NOTE — Patient Instructions (Addendum)
F/U in 4 month.  Blood sugar and cholesterol have both improved.  hBA1C chem 7, TSH in 4 month  It is important that you exercise regularly at least 45 minutes 5 times a week. If you develop chest pain, have severe difficulty breathing, or feel very tired, stop exercising immediately and seek medical attention   A healthy diet is rich in fruit, vegetables and whole grains. Poultry fish, nuts and beans are a healthy choice for protein rather then red meat. A low sodium diet and drinking 64 ounces of water daily is generally recommended. Oils and sweet should be limited. Carbohydrates especially for those who are diabetic or overweight, should be limited to 30-45 gram per meal. It is important to eat on a regular schedule, at least 3 times daily. Snacks should be primarily fruits, vegetables or nuts.  Blood pressure is excellent, no medication changes at this time

## 2011-08-19 NOTE — Assessment & Plan Note (Signed)
Controlled, no change in medication  

## 2011-08-19 NOTE — Assessment & Plan Note (Signed)
controlled currently, no recent flares

## 2011-08-19 NOTE — Assessment & Plan Note (Signed)
Deteriorated. Patient re-educated about  the importance of commitment to a  minimum of 150 minutes of exercise per week. The importance of healthy food choices with portion control discussed. Encouraged to start a food diary, count calories and to consider  joining a support group. Sample diet sheets offered. Goals set by the patient for the next several months.    

## 2011-08-19 NOTE — Assessment & Plan Note (Signed)
Controlled, no change in medication Hyperlipidemia:Low fat diet discussed and encouraged.  \ 

## 2011-08-21 ENCOUNTER — Telehealth: Payer: Self-pay | Admitting: Family Medicine

## 2011-08-21 NOTE — Telephone Encounter (Signed)
She just mentioned at last visit that she is seeing 2 doctors re stomach pain, I suggest she ask one of them to follow up on this problem with trials of alternate therapy

## 2011-08-24 NOTE — Telephone Encounter (Signed)
Pt aware.

## 2011-08-29 ENCOUNTER — Other Ambulatory Visit: Payer: Self-pay | Admitting: Family Medicine

## 2011-09-05 ENCOUNTER — Other Ambulatory Visit: Payer: Self-pay | Admitting: Family Medicine

## 2011-09-05 DIAGNOSIS — Z139 Encounter for screening, unspecified: Secondary | ICD-10-CM

## 2011-09-07 ENCOUNTER — Other Ambulatory Visit: Payer: Self-pay | Admitting: Family Medicine

## 2011-09-08 ENCOUNTER — Other Ambulatory Visit: Payer: Self-pay

## 2011-09-08 MED ORDER — OXYCODONE HCL 40 MG PO TB12: 40.0000 mg | ORAL_TABLET | Freq: Three times a day (TID) | ORAL | Status: DC

## 2011-09-12 ENCOUNTER — Other Ambulatory Visit: Payer: Self-pay | Admitting: Family Medicine

## 2011-09-12 ENCOUNTER — Other Ambulatory Visit: Payer: Self-pay

## 2011-09-12 DIAGNOSIS — Z79899 Other long term (current) drug therapy: Secondary | ICD-10-CM

## 2011-09-13 LAB — DRUG SCREEN, URINE
Benzodiazepines.: NEGATIVE
Creatinine,U: 123.86 mg/dL
Phencyclidine (PCP): NEGATIVE
Propoxyphene: NEGATIVE

## 2011-09-14 ENCOUNTER — Other Ambulatory Visit: Payer: Self-pay

## 2011-09-14 MED ORDER — CYCLOBENZAPRINE HCL 10 MG PO TABS: ORAL_TABLET | ORAL | Status: DC

## 2011-10-02 ENCOUNTER — Ambulatory Visit (HOSPITAL_COMMUNITY)
Admission: RE | Admit: 2011-10-02 | Discharge: 2011-10-02 | Disposition: A | Discharge status: Home / Self Care | Payer: Medicare Other | Source: Ambulatory Visit | Attending: Family Medicine | Admitting: Family Medicine

## 2011-10-02 DIAGNOSIS — Z1231 Encounter for screening mammogram for malignant neoplasm of breast: Secondary | ICD-10-CM | POA: Insufficient documentation

## 2011-10-02 DIAGNOSIS — Z139 Encounter for screening, unspecified: Secondary | ICD-10-CM

## 2011-10-04 NOTE — Progress Notes (Signed)
Pt has appt to come in and see dr. Lodema Hong on 10/12/2011 1:30. Pt aware

## 2011-10-06 ENCOUNTER — Other Ambulatory Visit: Payer: Self-pay

## 2011-10-06 MED ORDER — OXYCODONE HCL 40 MG PO TB12: 40.0000 mg | ORAL_TABLET | Freq: Three times a day (TID) | ORAL | Status: DC

## 2011-10-11 ENCOUNTER — Encounter: Payer: Self-pay | Admitting: Family Medicine

## 2011-10-11 ENCOUNTER — Ambulatory Visit (INDEPENDENT_AMBULATORY_CARE_PROVIDER_SITE_OTHER): Payer: Medicare Other | Admitting: Family Medicine

## 2011-10-11 VITALS — BP 132/76 | HR 101 | Resp 18 | Ht 65.5 in | Wt 281.1 lb

## 2011-10-11 DIAGNOSIS — M545 Low back pain: Secondary | ICD-10-CM

## 2011-10-11 DIAGNOSIS — I10 Essential (primary) hypertension: Secondary | ICD-10-CM

## 2011-10-11 DIAGNOSIS — E119 Type 2 diabetes mellitus without complications: Secondary | ICD-10-CM

## 2011-10-11 NOTE — Patient Instructions (Addendum)
F/ U second week in August.Cancel later appt  Please take medication for pain as discussed, Twice daily Monday through Thursday and on Sunday night only.  No scripts needed at this time, none before f/u   I do believe that another medication Loletha Carrow would be  More beneficial, will discuss at next visit

## 2011-10-11 NOTE — Progress Notes (Signed)
  Subjective:    [name], female    DOB: July 19, 1960, 51 y.o.   MRN: [id number]  HPI Pt in for abnormal UDS, states she was in [place] from 4/31 till 5/13, she had no medication  for pain during that time, states that is why the urine screen is negative. States she is falling a lot, works , which involves walking and this  Is too much for her. States her back pain is worsening,, states she honestly only takes her pills from Sunday night, then Monday through Thursday as she is unable to work with the med in her system, and does not feel safe to drive with them also. We discussed at length the need to be consistent in use or honest in reporting use so she does not have an excessive amt of medication at any time, and does not have  Another negative drug screen States she rally feels unable to work but has no choice   Review of Systems See HPI Denies recent fever or chills. Denies sinus pressure, nasal congestion, ear pain or sore throat. Denies chest congestion, productive cough or wheezing. Denies chest pains, palpitations and leg swelling Denies abdominal pain, nausea, vomiting,diarrhea or constipation.   Denies dysuria, frequency, hesitancy or incontinence.  Denies headaches, seizures, numbness, or tingling. C/o increased depression, anxiety or insomnia. Denies skin break down or rash.        Objective:   Physical Exam Patient alert and oriented and in no cardiopulmonary distress.  HEENT: No facial asymmetry, EOMI, no sinus tenderness,  oropharynx pink and moist.  Neck supple no adenopathy.  Chest: Clear to auscultation bilaterally.  CVS: S1, S2 no murmurs, no S3.  ABD: Soft non tender. Bowel sounds normal.  Ext: No edema  MS: decreased ROM spine, shoulders, hips and knees.  Skin: Intact, no ulcerations or rash noted.  Psych: Good eye contact, normal affect. Memory intact not anxious or depressed appearing.  CNS: CN 2-12 intact, power, tone and sensation  normal throughout.        Assessment & Plan:

## 2011-10-12 ENCOUNTER — Other Ambulatory Visit: Payer: Self-pay | Admitting: Family Medicine

## 2011-10-22 NOTE — Assessment & Plan Note (Signed)
Worsening with h/o repeated falls, medication management discussed esp in light of failed UDS, which , if repeated will result in her having to be treated at a pain clinic

## 2011-10-22 NOTE — Assessment & Plan Note (Signed)
Controlled, no change in medication  

## 2011-11-13 ENCOUNTER — Ambulatory Visit: Payer: Medicare Other | Admitting: Family Medicine

## 2011-11-23 ENCOUNTER — Emergency Department (HOSPITAL_COMMUNITY): Payer: Medicare Other

## 2011-11-23 ENCOUNTER — Encounter (HOSPITAL_COMMUNITY): Payer: Self-pay | Admitting: *Deleted

## 2011-11-23 ENCOUNTER — Emergency Department (HOSPITAL_COMMUNITY)
Admission: EM | Admit: 2011-11-23 | Discharge: 2011-11-23 | Disposition: A | Discharge status: Home / Self Care | Payer: Medicare Other | Attending: Emergency Medicine | Admitting: Emergency Medicine

## 2011-11-23 DIAGNOSIS — Z79899 Other long term (current) drug therapy: Secondary | ICD-10-CM | POA: Insufficient documentation

## 2011-11-23 DIAGNOSIS — E119 Type 2 diabetes mellitus without complications: Secondary | ICD-10-CM | POA: Insufficient documentation

## 2011-11-23 DIAGNOSIS — F341 Dysthymic disorder: Secondary | ICD-10-CM | POA: Insufficient documentation

## 2011-11-23 DIAGNOSIS — N201 Calculus of ureter: Secondary | ICD-10-CM | POA: Insufficient documentation

## 2011-11-23 DIAGNOSIS — R1031 Right lower quadrant pain: Secondary | ICD-10-CM | POA: Insufficient documentation

## 2011-11-23 DIAGNOSIS — I10 Essential (primary) hypertension: Secondary | ICD-10-CM | POA: Insufficient documentation

## 2011-11-23 DIAGNOSIS — N390 Urinary tract infection, site not specified: Secondary | ICD-10-CM

## 2011-11-23 DIAGNOSIS — Z9089 Acquired absence of other organs: Secondary | ICD-10-CM | POA: Insufficient documentation

## 2011-11-23 LAB — URINALYSIS, ROUTINE W REFLEX MICROSCOPIC
Bilirubin Urine: NEGATIVE
Leukocytes, UA: NEGATIVE
Nitrite: NEGATIVE
Specific Gravity, Urine: >1.03 — ABNORMAL HIGH (ref 1.005–1.030)
Urobilinogen, UA: 0.2 mg/dL (ref 0.0–1.0)

## 2011-11-23 LAB — URINE MICROSCOPIC-ADD ON

## 2011-11-23 MED ORDER — HYDROMORPHONE BOLUS VIA INFUSION: 1.0000 mg | Freq: Once | INTRAVENOUS | Status: DC

## 2011-11-23 MED ORDER — PROMETHAZINE HCL 25 MG RE SUPP: 25.0000 mg | Freq: Four times a day (QID) | RECTAL | Status: DC | PRN

## 2011-11-23 MED ORDER — CEPHALEXIN 500 MG PO CAPS
500.0000 mg | ORAL_CAPSULE | Freq: Three times a day (TID) | ORAL | Status: DC
  Administered 2011-11-23: 500 mg via ORAL
  Filled 2011-11-23: qty 1

## 2011-11-23 MED ORDER — ONDANSETRON HCL 4 MG/2ML IJ SOLN
4.0000 mg | Freq: Once | INTRAMUSCULAR | Status: AC
  Administered 2011-11-23: 4 mg via INTRAVENOUS

## 2011-11-23 MED ORDER — FLUCONAZOLE 200 MG PO TABS: 200.0000 mg | ORAL_TABLET | Freq: Every day | ORAL | Status: DC

## 2011-11-23 MED ORDER — HYDROMORPHONE HCL PF 1 MG/ML IJ SOLN
1.0000 mg | Freq: Once | INTRAMUSCULAR | Status: AC
  Administered 2011-11-23: 1 mg via INTRAVENOUS

## 2011-11-23 MED ORDER — ONDANSETRON HCL 4 MG/2ML IJ SOLN
4.0000 mg | Freq: Four times a day (QID) | INTRAMUSCULAR | Status: DC | PRN
  Administered 2011-11-23: 4 mg via INTRAVENOUS
  Filled 2011-11-23 (×2): qty 2

## 2011-11-23 MED ORDER — HYDROMORPHONE HCL PF 2 MG/ML IJ SOLN
1.0000 mg | Freq: Once | INTRAMUSCULAR | Status: AC
  Administered 2011-11-23: 1 mg via INTRAVENOUS
  Filled 2011-11-23: qty 1

## 2011-11-23 MED ORDER — HYDROMORPHONE HCL PF 1 MG/ML IJ SOLN
INTRAMUSCULAR | Status: AC
  Filled 2011-11-23: qty 1

## 2011-11-23 MED ORDER — TAMSULOSIN HCL 0.4 MG PO CAPS: ORAL_CAPSULE | ORAL | Status: DC

## 2011-11-23 MED ORDER — SODIUM CHLORIDE 0.9 % IV SOLN
INTRAVENOUS | Status: DC
  Administered 2011-11-23: 19:00:00 via INTRAVENOUS

## 2011-11-23 MED ORDER — PERCOCET 5-325 MG PO TABS: ORAL_TABLET | ORAL | Status: DC

## 2011-11-23 MED ORDER — PROMETHAZINE HCL 25 MG PO TABS: 25.0000 mg | ORAL_TABLET | Freq: Three times a day (TID) | ORAL | Status: DC | PRN

## 2011-11-23 NOTE — ED Notes (Signed)
Low abdominal pain radiating into right side

## 2011-11-23 NOTE — ED Provider Notes (Cosign Needed Addendum)
History     CSN: [id number]  Arrival date & time 11/23/11  1647   First MD Initiated Contact with Patient 11/23/11 1749      Chief Complaint  Patient presents with  . Abdominal Pain    (Consider location/radiation/quality/duration/timing/severity/associated sxs/prior treatment) HPI  Patient relates for the past 2 weeks she's had some pain in her right lower quadrant that radiates down into her leg and into her right flank. She states the pain is off and on and sometimes can last all day long, she states the pain she's having now is been there for about 5 hours. She denies any vaginal discharge, frequency, urgency, hematuria, nausea or vomiting. She states only thing that seems to help is to lay in a fetal position. She states nothing makes the pain hurt more. She denies being a large caffeine drinker however she states she has had kidney stones in the past however they have always been on her left side. She states she has had a hysterectomy in 1991 and still has her right ovary. She has appointment with Dr. Emelda Fear, GYN next week. She states however she cannot stand the pain until then.  PCP Syliva Overman  Past Medical History  Diagnosis Date  . Low back pain   . Hyperthyroidism   . Hypertension   . DM type 2 (diabetes mellitus, type 2)   . Depression   . Asthma   . Anxiety   . Helicobacter pylori gastritis 2008  . Severe obesity (BMI >= 40) NOV 2008 270 LBS  . Hypokalemia    renal stones  Past Surgical History  Procedure Date  . Cholecystectomy   . Left knee arthroscopic surgery 1999  . Back surgery 1993  . Back surgery 1994  . Back surgery 2002  . Left salphingectomy secondary to ectopic pregnancy 1991  . Partial hysterectomy 1991  . Back surgery 2011    [facility]   . Knee arthroscopy 10/04/2010,     right knee arthroscopy, dr Thomasena Edis  . Upper gastrointestinal endoscopy 2008 abd pain    H. pylori gastritis  . [place] gastrointestinal endoscopy [place] [facility]    PUD    . Colonoscopy 2000 [facility]    INT HEMORRHOIDS/FISSURE  . Colonoscopy 2003 [facility], CHANGE IN BOWEL HABITS    INT HEMORRHOIDS  . Colonoscopy 2006 [facility]    INT HEMORRHOIDS  . Colonoscopy 2007 [facility]    INT HEMORRHOIDS  . Esophagogastroduodenoscopy 2008    Small hiatal hernia./Normal esophagus without evidence of Barrett's mass, stricture, erosion or ulceration./Normal duodenal bulb and second portion of the duodenum./Diffuse erythema in the body and the antrum with occasional erosion.  Biopsies obtained via cold forceps to evaluate for H. pylori gastritis    Family History  Problem Relation Age of Onset  . Heart disease Mother   . Hypertension Mother   . Cancer Mother     cervical   . Heart attack Father   . Cancer Father     prostate  . Colon polyps Neg Hx   . Colon cancer Neg Hx     History  Substance Use Topics  . Smoking status: Never Smoker   . Smokeless tobacco: Not on file  . Alcohol Use: No   on disability for degenerative disc disease of spine Works part-time in security  OB History    Grav Para Term Preterm Abortions TAB SAB Ect Mult Living  Review of Systems  All other systems reviewed and are negative.    Allergies  Aspirin and Morphine  Home Medications   Current Outpatient Rx  Name Route Sig Dispense Refill  . AMLODIPINE BESYLATE 5 MG PO TABS Oral Take 5 mg by mouth 2 (two) times daily.     Marlin Canary HEADACHE PO Oral Take 1 packet by mouth as needed. For pain    . ATORVASTATIN CALCIUM 20 MG PO TABS  TAKE 1 TABLET BY MOUTH EVERY DAY 30 tablet 0  . CYCLOBENZAPRINE HCL 10 MG PO TABS  TAKE 1 TABLET BY MOUTH THREE TIMES DAILY 90 tablet 3  . DIAZEPAM 5 MG PO TABS      . FLUOXETINE HCL 20 MG PO CAPS  TAKE ONE CAPSULE BY MOUTH EVERY DAY 30 capsule 3  . FLUTICASONE PROPIONATE 50 MCG/ACT NA SUSP  USE 2 SPRAYS IN EACH NOSTRIL EVERY DAY 16 g 5  . GABAPENTIN 800 MG PO TABS Oral Take 1,600 mg by mouth 3 (three) times daily.    . IBUPROFEN 200  MG PO TABS Oral Take 400-800 mg by mouth as needed. For pain    . IPRATROPIUM-ALBUTEROL 0.5-2.5 (3) MG/3ML IN SOLN Nebulization Take 3 mLs by nebulization every 6 (six) hours as needed. 360 mL 3  . METFORMIN HCL 1000 MG PO TABS  TAKE 1 TABLET BY MOUTH TWICE DAILY 180 tablet 1  . MONTELUKAST SODIUM 10 MG PO TABS  TAKE 1 TABLET BY MOUTH DAILY 30 tablet 4  . NITROGLYCERIN 0.4 MG/SPRAY TL SOLN Sublingual Place 1 spray under the tongue as directed.      . OXYCODONE HCL ER 40 MG PO TB12 Oral Take 40 mg by mouth every 12 (twelve) hours.    Marland Kitchen PANTOPRAZOLE SODIUM 40 MG PO TBEC Oral Take 40 mg by mouth every morning.    Marland Kitchen POTASSIUM CHLORIDE CRYS ER 20 MEQ PO TBCR  TAKE 1 TABLET BY MOUTH EVERY DAY 30 tablet 5  . PROAIR HFA 108 (90 BASE) MCG/ACT IN AERS  INHALE 2 PUFFS BY MOUTH EVERY 6 TO 8 HOURS AS NEEDED 8.5 g 2  . SYMBICORT 160-4.5 MCG/ACT IN AERO  INHALE 2 PUFFS BY MOUTH TWICE DAILY 10.2 g 3  . VALSARTAN-HYDROCHLOROTHIAZIDE 80-12.5 MG PO TABS  TAKE ONE TABLET BY MOUTH DAILY 30 tablet 4  . VITAMIN D (ERGOCALCIFEROL) 50000 UNITS PO CAPS Oral Take 50,000 Units by mouth every 7 (seven) days. On MONDAYS    . ZOLPIDEM TARTRATE 10 MG PO TABS  TAKE 1 TABLET BY MOUTH EVERY NIGHT AT BEDTIME 30 tablet 4    BP 143/97  Pulse 91  Temp 98 F (36.7 C) (Oral)  Resp 21  Ht 5\' 4"  (1.626 m)  Wt 269 lb (122.018 kg)  BMI 46.17 kg/m2  SpO2 99%  Vital signs normal    Physical Exam  Nursing note and vitals reviewed. Constitutional: She is oriented to person, place, and time. She appears well-developed and well-nourished.  Non-toxic appearance. She does not appear ill. She appears distressed.       Obese  HENT:  Head: Normocephalic and atraumatic.  Right Ear: External ear normal.  Left Ear: External ear normal.  Nose: Nose normal. No mucosal edema or rhinorrhea.  Mouth/Throat: Oropharynx is clear and moist and mucous membranes are normal. No dental abscesses or uvula swelling.  Eyes: Conjunctivae and EOM are  normal. Pupils are equal, round, and reactive to light.  Neck: Normal range of motion and full passive range  of motion without pain. Neck supple.  Cardiovascular: Normal rate, regular rhythm and normal heart sounds.  Exam reveals no gallop and no friction rub.   No murmur heard. Pulmonary/Chest: Effort normal and breath sounds normal. No respiratory distress. She has no wheezes. She has no rhonchi. She has no rales. She exhibits no tenderness and no crepitus.  Abdominal: Soft. Normal appearance and bowel sounds are normal. She exhibits no distension. There is tenderness. There is no rebound and no guarding.       In the right lower quadrant without guarding or rebound with some discomfort in her right flank area  Musculoskeletal: Normal range of motion. She exhibits no edema and no tenderness.       Moves all extremities well.   Neurological: She is alert and oriented to person, place, and time. She has normal strength. No cranial nerve deficit.  Skin: Skin is warm, dry and intact. No rash noted. No erythema. No pallor.  Psychiatric: She has a normal mood and affect. Her speech is normal and behavior is normal. Her mood appears not anxious.    ED Course  Procedures (including critical care time)   Medications  cephALEXin (KEFLEX) capsule 500 mg (not administered)  fluconazole (DIFLUCAN) 200 MG tablet (not administered)  HYDROmorphone (DILAUDID) injection 1 mg (1 mg Intravenous Given 11/23/11 1853)  HYDROmorphone (DILAUDID) injection 1 mg (1 mg Intravenous Given 11/23/11 2031)   Pt states she always gets a yeast infection while on antibiotics.   Pt feels ready to go home.    Results for orders placed during the hospital encounter of 11/23/11  URINALYSIS, ROUTINE W REFLEX MICROSCOPIC      Component Value Range   Color, Urine AMBER (*) YELLOW   APPearance CLEAR  CLEAR   Specific Gravity, Urine >1.030 (*) 1.005 - 1.030   pH 5.5  5.0 - 8.0   Glucose, UA NEGATIVE  NEGATIVE mg/dL   Hgb  urine dipstick MODERATE (*) NEGATIVE   Bilirubin Urine NEGATIVE  NEGATIVE   Ketones, ur NEGATIVE  NEGATIVE mg/dL   Protein, ur NEGATIVE  NEGATIVE mg/dL   Urobilinogen, UA 0.2  0.0 - 1.0 mg/dL   Nitrite NEGATIVE  NEGATIVE   Leukocytes, UA NEGATIVE  NEGATIVE  URINE MICROSCOPIC-ADD ON      Component Value Range   Squamous Epithelial / LPF FEW (*) RARE   WBC, UA 3-6  <3 WBC/hpf   RBC / HPF 7-10  <3 RBC/hpf   Bacteria, UA FEW (*) RARE   Laboratory interpretation all normal except possible urinary tract infection   Ct Abdomen Pelvis Wo Contrast  11/23/2011  *RADIOLOGY REPORT*  Clinical Data: Right flank and right lower quadrant pain for 2 weeks.  CT ABDOMEN AND PELVIS WITHOUT CONTRAST  Technique:  Multidetector CT imaging of the abdomen and pelvis was performed following the standard protocol without intravenous contrast.  Comparison: Prior CT urograms from [facility]  urology most recent 09/09/2007.  Findings: Right hydronephrosis and hydroureter secondary to a distal ureteral calculus 3 x 4 mm cross-section lodged approximately 2 cm above the UVJ.  A tiny phlebolith in the lower pelvis (image 84) simulates a distal ureteral calculus but it is outside the ureter. Slight right perinephric fluid. Tiny 1 mm right lower pole renal calculus.  Nonobstructing left renal calculi are seen in the upper (1 mm) and lower (multiple, largest 9 x 10 mm)pole collecting systems.  Multiple hepatic cysts are redemonstrated, similar to priors.  No pancreatic cysts are observed.  The gallbladder  is surgically absent.  Normal adrenal glands.  No bowel obstruction.  No appendiceal inflammation.  No pelvic masses or free fluid.  Prior lumbar fusion with instrumentation.  Vacuum phenomenon L3-L4 disc.  IMPRESSION: Right hydronephrosis and hydroureter secondary to a distal ureteral calculus 3 x 4 mm in diameter.  Nonobstructing left renal calculi.  Original Report Authenticated By: Elsie Stain, M.D.    1. Right ureteral  stone   2. UTI (lower urinary tract infection)    New Prescriptions   FLUCONAZOLE (DIFLUCAN) 200 MG TABLET    Take 1 tablet (200 mg total) by mouth daily.   PERCOCET 5-325 MG PER TABLET    Take 1 or 2 po Q 6hrs for pain   PROMETHAZINE (PHENERGAN) 25 MG SUPPOSITORY    Place 1 suppository (25 mg total) rectally every 6 (six) hours as needed for nausea.   PROMETHAZINE (PHENERGAN) 25 MG TABLET    Take 1 tablet (25 mg total) by mouth every 8 (eight) hours as needed for nausea.   TAMSULOSIN HCL (FLOMAX) 0.4 MG CAPS    Take 1 po QD until you pass the stone.    Plan discharge  Devoria Albe, MD, FACEP   MDM          Ward Givens, MD 11/23/11 2129  Ward Givens, MD 11/23/11 2137

## 2011-11-25 LAB — URINE CULTURE: Special Requests: NORMAL

## 2011-11-28 ENCOUNTER — Other Ambulatory Visit: Payer: Self-pay | Admitting: Urology

## 2011-11-30 ENCOUNTER — Encounter (HOSPITAL_COMMUNITY): Payer: Self-pay | Admitting: Pharmacy Technician

## 2011-12-01 ENCOUNTER — Encounter (HOSPITAL_COMMUNITY)
Admission: RE | Admit: 2011-12-01 | Discharge: 2011-12-01 | Disposition: A | Discharge status: Home / Self Care | Payer: Medicare Other | Source: Ambulatory Visit | Attending: Urology | Admitting: Urology

## 2011-12-01 ENCOUNTER — Encounter (HOSPITAL_COMMUNITY): Payer: Self-pay

## 2011-12-01 HISTORY — DX: Personal history of other diseases of the digestive system: Z87.19

## 2011-12-01 HISTORY — DX: Acute myocardial infarction, unspecified: I21.9

## 2011-12-01 HISTORY — DX: Myoneural disorder, unspecified: G70.9

## 2011-12-01 HISTORY — DX: Pure hypercholesterolemia, unspecified: E78.00

## 2011-12-01 HISTORY — DX: Sleep apnea, unspecified: G47.30

## 2011-12-01 HISTORY — DX: Cardiac arrhythmia, unspecified: I49.9

## 2011-12-01 HISTORY — DX: Gastro-esophageal reflux disease without esophagitis: K21.9

## 2011-12-01 HISTORY — DX: Unspecified osteoarthritis, unspecified site: M19.90

## 2011-12-01 LAB — BASIC METABOLIC PANEL
BUN: 16 mg/dL (ref 6–23)
Creatinine, Ser: 0.77 mg/dL (ref 0.50–1.10)
GFR calc Af Amer: >90 mL/min (ref >90)
GFR calc non Af Amer: >90 mL/min (ref >90)
Glucose, Bld: 99 mg/dL (ref 70–99)

## 2011-12-01 LAB — CBC
HCT: 38 % (ref 36.0–46.0)
Hemoglobin: 12.3 g/dL (ref 12.0–15.0)
MCH: 25.2 pg — ABNORMAL LOW (ref 26.0–34.0)
MCHC: 32.4 g/dL (ref 30.0–36.0)
MCV: 77.7 fL — ABNORMAL LOW (ref 78.0–100.0)
RDW: 14.7 % (ref 11.5–15.5)

## 2011-12-01 NOTE — Patient Instructions (Addendum)
[address] Turkey Budhu  12/01/2011   Your procedure is scheduled on:  12/04/11  Surgery 1230-1405  Report to [facility] at    1000   AM.  Call this number if you have problems the morning of surgery: [phone]     Or PST   [id number]  [facility]   Remember:   Do not eat food or drink any fluids :After Midnight.  Sunday NIGHT                                        EAT SNACK BEFORE BEDTIME Sunday NIGHT  DO NOT TAKE ANY BLOOD SUGAR MEDS  MORNING OF SURGERY                            BRING INHALERS WITH YOU TO HOSPITAL  Take these medicines the morning of surgery with A SIP OF WATER: AMLODIPINE, NEURONTIN, SINGULAIR, PROTONIX  With sip water                   SYMBICORT MAY USE DUONED, ALBUTEROL   If needed    MAY TAKE PERCOCET, FLEXARIL IF NEEDED  Do not wear jewelry, make-up or nail polish.  Do not wear lotions, powders, or perfumes. You may wear deodorant.  Do not shave 48 hours prior to surgery.  Do not bring valuables to the hospital.  Contacts, dentures or bridgework may not be worn into surgery.  Leave suitcase in the car. After surgery it may be brought to your room.  For patients admitted to the hospital, checkout time is 11:00 AM the day of discharge.   Patients discharged the day of surgery will not be allowed to drive home.  Name and phone number of your driver:  sister                                                                      Special Instructions: CHG Shower Use Special Wash: 1/2 bottle night before surgery and 1/2 bottle morning of surgery. REGULAR SOAP FACE AND PRIVATES              LADIES- NO SHAVING 48 HOURS BEFORE USING BETASEPT SOAP.              Please read over the following fact sheets that you were given: MRSA Information

## 2011-12-01 NOTE — Progress Notes (Signed)
12/01/11 1410  OBSTRUCTIVE SLEEP APNEA  Have you ever been diagnosed with sleep apnea through a sleep study? No  Do you snore loudly (loud enough to be heard through closed doors)?  1  Do you often feel tired, fatigued, or sleepy during the daytime? 1  Has anyone observed you stop breathing during your sleep? 0  Do you have, or are you being treated for high blood pressure? 1  BMI more than 35 kg/m2? 1  Age over 51 years old? 1  Neck circumference greater than 40 cm/18 inches? 1  Gender: 0  Obstructive Sleep Apnea Score 6   Score 4 or greater  Updated health history;Results sent to PCP

## 2011-12-01 NOTE — Pre-Procedure Instructions (Signed)
OV DR Lodema Hong 6/12 EPIC,   Unable to obtain old EKG from Dr Ronie Spies office- closed

## 2011-12-04 ENCOUNTER — Ambulatory Visit (HOSPITAL_COMMUNITY)
Admission: RE | Admit: 2011-12-04 | Discharge: 2011-12-04 | Disposition: A | Discharge status: Home / Self Care | Payer: Medicare Other | Source: Ambulatory Visit | Attending: Urology | Admitting: Urology

## 2011-12-04 ENCOUNTER — Encounter (HOSPITAL_COMMUNITY): Payer: Self-pay | Admitting: Anesthesiology

## 2011-12-04 ENCOUNTER — Ambulatory Visit (HOSPITAL_COMMUNITY): Payer: Medicare Other

## 2011-12-04 ENCOUNTER — Encounter (HOSPITAL_COMMUNITY)
Admission: RE | Disposition: A | Discharge status: Home / Self Care | Payer: Self-pay | Source: Ambulatory Visit | Attending: Urology

## 2011-12-04 ENCOUNTER — Encounter (HOSPITAL_COMMUNITY): Payer: Self-pay | Admitting: *Deleted

## 2011-12-04 ENCOUNTER — Ambulatory Visit (HOSPITAL_COMMUNITY): Payer: Medicare Other | Admitting: Anesthesiology

## 2011-12-04 DIAGNOSIS — Z228 Carrier of other infectious diseases: Secondary | ICD-10-CM | POA: Insufficient documentation

## 2011-12-04 DIAGNOSIS — K219 Gastro-esophageal reflux disease without esophagitis: Secondary | ICD-10-CM | POA: Insufficient documentation

## 2011-12-04 DIAGNOSIS — E119 Type 2 diabetes mellitus without complications: Secondary | ICD-10-CM | POA: Insufficient documentation

## 2011-12-04 DIAGNOSIS — G4730 Sleep apnea, unspecified: Secondary | ICD-10-CM | POA: Insufficient documentation

## 2011-12-04 DIAGNOSIS — E669 Obesity, unspecified: Secondary | ICD-10-CM | POA: Insufficient documentation

## 2011-12-04 DIAGNOSIS — N201 Calculus of ureter: Secondary | ICD-10-CM

## 2011-12-04 DIAGNOSIS — Z01812 Encounter for preprocedural laboratory examination: Secondary | ICD-10-CM | POA: Insufficient documentation

## 2011-12-04 DIAGNOSIS — I252 Old myocardial infarction: Secondary | ICD-10-CM | POA: Insufficient documentation

## 2011-12-04 DIAGNOSIS — Z6841 Body Mass Index (BMI) 40.0 and over, adult: Secondary | ICD-10-CM | POA: Insufficient documentation

## 2011-12-04 DIAGNOSIS — Z01810 Encounter for preprocedural cardiovascular examination: Secondary | ICD-10-CM | POA: Insufficient documentation

## 2011-12-04 DIAGNOSIS — E7800 Pure hypercholesterolemia, unspecified: Secondary | ICD-10-CM | POA: Insufficient documentation

## 2011-12-04 DIAGNOSIS — J45909 Unspecified asthma, uncomplicated: Secondary | ICD-10-CM | POA: Insufficient documentation

## 2011-12-04 HISTORY — PX: CYSTOSCOPY/RETROGRADE/URETEROSCOPY: SHX5316

## 2011-12-04 SURGERY — CYSTOSCOPY/RETROGRADE/URETEROSCOPY
Anesthesia: General | Laterality: Right | Wound class: Clean Contaminated

## 2011-12-04 MED ORDER — ACETAMINOPHEN 10 MG/ML IV SOLN
INTRAVENOUS | Status: AC
  Filled 2011-12-04: qty 100

## 2011-12-04 MED ORDER — HYOSCYAMINE SULFATE 0.125 MG PO TABS: 0.1250 mg | ORAL_TABLET | ORAL | Status: DC | PRN

## 2011-12-04 MED ORDER — ONDANSETRON HCL 4 MG/2ML IJ SOLN
INTRAMUSCULAR | Status: DC | PRN
  Administered 2011-12-04: 4 mg via INTRAVENOUS

## 2011-12-04 MED ORDER — LABETALOL HCL 5 MG/ML IV SOLN
INTRAVENOUS | Status: DC | PRN
  Administered 2011-12-04: 5 mg via INTRAVENOUS

## 2011-12-04 MED ORDER — IOHEXOL 300 MG/ML  SOLN
INTRAMUSCULAR | Status: DC | PRN
  Administered 2011-12-04: 6 mL via INTRAVENOUS

## 2011-12-04 MED ORDER — CEFAZOLIN SODIUM-DEXTROSE 2-3 GM-% IV SOLR
INTRAVENOUS | Status: AC
  Filled 2011-12-04: qty 50

## 2011-12-04 MED ORDER — SUCCINYLCHOLINE CHLORIDE 20 MG/ML IJ SOLN
INTRAMUSCULAR | Status: DC | PRN
  Administered 2011-12-04: 60 mg via INTRAVENOUS

## 2011-12-04 MED ORDER — BELLADONNA ALKALOIDS-OPIUM 16.2-60 MG RE SUPP
RECTAL | Status: AC
  Filled 2011-12-04: qty 1

## 2011-12-04 MED ORDER — OXYCODONE-ACETAMINOPHEN 5-325 MG PO TABS: 1.0000 | ORAL_TABLET | Freq: Four times a day (QID) | ORAL | Status: DC | PRN

## 2011-12-04 MED ORDER — SODIUM CHLORIDE 0.9 % IR SOLN
Status: DC | PRN
  Administered 2011-12-04: 3000 mL

## 2011-12-04 MED ORDER — LIDOCAINE HCL 2 % EX GEL
CUTANEOUS | Status: AC
  Filled 2011-12-04: qty 10

## 2011-12-04 MED ORDER — ACETAMINOPHEN 10 MG/ML IV SOLN
INTRAVENOUS | Status: DC | PRN
  Administered 2011-12-04: 1000 mg via INTRAVENOUS

## 2011-12-04 MED ORDER — CEFAZOLIN SODIUM 1-5 GM-% IV SOLN
INTRAVENOUS | Status: AC
  Filled 2011-12-04: qty 50

## 2011-12-04 MED ORDER — IOHEXOL 300 MG/ML  SOLN
INTRAMUSCULAR | Status: AC
  Filled 2011-12-04: qty 1

## 2011-12-04 MED ORDER — MIDAZOLAM HCL 5 MG/5ML IJ SOLN
INTRAMUSCULAR | Status: DC | PRN
  Administered 2011-12-04: 2 mg via INTRAVENOUS

## 2011-12-04 MED ORDER — PROMETHAZINE HCL 25 MG/ML IJ SOLN: 6.2500 mg | INTRAMUSCULAR | Status: DC | PRN

## 2011-12-04 MED ORDER — FENTANYL CITRATE 0.05 MG/ML IJ SOLN
INTRAMUSCULAR | Status: DC | PRN
  Administered 2011-12-04 (×2): 25 ug via INTRAVENOUS
  Administered 2011-12-04: 50 ug via INTRAVENOUS
  Administered 2011-12-04: 25 ug via INTRAVENOUS

## 2011-12-04 MED ORDER — BELLADONNA ALKALOIDS-OPIUM 16.2-60 MG RE SUPP
RECTAL | Status: DC | PRN
  Administered 2011-12-04: 1 via RECTAL

## 2011-12-04 MED ORDER — LACTATED RINGERS IV SOLN
INTRAVENOUS | Status: DC
  Administered 2011-12-04: 12:00:00 via INTRAVENOUS

## 2011-12-04 MED ORDER — LIDOCAINE HCL 2 % EX GEL
CUTANEOUS | Status: DC | PRN
  Administered 2011-12-04: 1 via URETHRAL

## 2011-12-04 MED ORDER — FENTANYL CITRATE 0.05 MG/ML IJ SOLN: 25.0000 ug | INTRAMUSCULAR | Status: DC | PRN

## 2011-12-04 MED ORDER — DEXTROSE 5 % IV SOLN
3.0000 g | INTRAVENOUS | Status: AC
  Administered 2011-12-04: 3 g via INTRAVENOUS

## 2011-12-04 MED ORDER — CEPHALEXIN 500 MG PO CAPS: 500.0000 mg | ORAL_CAPSULE | Freq: Three times a day (TID) | ORAL | Status: AC

## 2011-12-04 MED ORDER — SODIUM CHLORIDE 0.9 % IR SOLN
Status: DC | PRN
  Administered 2011-12-04: 1000 mL

## 2011-12-04 MED ORDER — METOCLOPRAMIDE HCL 5 MG/ML IJ SOLN
INTRAMUSCULAR | Status: DC | PRN
  Administered 2011-12-04: 10 mg via INTRAVENOUS

## 2011-12-04 SURGICAL SUPPLY — 25 items
ADAPTER CATH URET PLST 4-6FR (CATHETERS) ×2 IMPLANT
ADPR CATH URET STRL DISP 4-6FR (CATHETERS) ×1
BAG URO CATCHER STRL LF (DRAPE) ×2 IMPLANT
BASKET ZERO TIP NITINOL 2.4FR (BASKET) IMPLANT
BSKT STON RTRVL ZERO TP 2.4FR (BASKET)
CATH INTERMIT  6FR 70CM (CATHETERS) ×1 IMPLANT
CATH URET 5FR 28IN OPEN ENDED (CATHETERS) ×2 IMPLANT
CLOTH BEACON ORANGE TIMEOUT ST (SAFETY) ×2 IMPLANT
DRAPE CAMERA CLOSED 9X96 (DRAPES) ×2 IMPLANT
GLOVE BIOGEL PI IND STRL 7.0 (GLOVE) ×1 IMPLANT
GLOVE BIOGEL PI IND STRL 7.5 (GLOVE) ×1 IMPLANT
GLOVE BIOGEL PI INDICATOR 7.0 (GLOVE) ×1
GLOVE BIOGEL PI INDICATOR 7.5 (GLOVE)
GLOVE ECLIPSE 7.5 STRL STRAW (GLOVE) ×2 IMPLANT
GLOVE SURG SS PI 8.0 STRL IVOR (GLOVE) ×1 IMPLANT
GOWN PREVENTION PLUS XLARGE (GOWN DISPOSABLE) ×2 IMPLANT
GOWN STRL NON-REIN LRG LVL3 (GOWN DISPOSABLE) ×2 IMPLANT
GOWN STRL REIN XL XLG (GOWN DISPOSABLE) ×2 IMPLANT
GUIDEWIRE ANG ZIPWIRE 038X150 (WIRE) IMPLANT
GUIDEWIRE STR DUAL SENSOR (WIRE) ×2 IMPLANT
MANIFOLD NEPTUNE II (INSTRUMENTS) ×2 IMPLANT
MARKER SKIN DUAL TIP RULER LAB (MISCELLANEOUS) ×1 IMPLANT
PACK CYSTO (CUSTOM PROCEDURE TRAY) ×2 IMPLANT
TUBING CONNECTING 10 (TUBING) ×2 IMPLANT
WIRE COONS/BENSON .038X145CM (WIRE) ×1 IMPLANT

## 2011-12-04 NOTE — H&P (Signed)
Urology History and Physical Exam  CC: Right distal ureter stone.  HPI: 51 year old female with a right ureter stone. This is associated with intermittent right flank pain. It is improved with narcotic. It radiates to the ipsilateral abdomen. It is not associated with fevers or chills. She had a CT of the abdomen and pelvis without contrast July 25 2 dozen 13 which revealed a distal right ureter stone which is 2 cm proximal to the ureterovesical junction. The stone measured 3 x 4 mm. There was associated right hydronephrosis and hydroureter. She also has a left upper pole 1 mm stone which is nonobstructing and a left lower pole 9 x 10 mm stone with other stones in the lower pole of the left side as well. She presents today for cystoscopy, right ureteroscopy, laser lithotripsy, possible right ureteral stent placement. We have discussed the risks, benefits, alternatives, and likelihood of achieving goals. She had a urine culture in the ER on July 25 which grew 30,000 multiple morphotype bacteria. She has been on Keflex for this. When I saw her on July 29 in the clinic her UA was negative for signs of infection.  She states her pain has resolved, but she has not see the stone pass. We discussed obtaining a KUB vs proceeding with planned procedure along with the risks/benefits of this. She would like to continue with the surgery.   PMH: Past Medical History  Diagnosis Date  . Low back pain   . DM type 2 (diabetes mellitus, type 2)   . Helicobacter pylori gastritis 2008  . Severe obesity (BMI >= 40) NOV 2008 270 LBS  . Hypokalemia   . Hyperthyroidism     s/p radiation  . Chronic kidney disease     stones  . GERD (gastroesophageal reflux disease)   . H/O hiatal hernia   . Headache     migraines  . Arthritis   . Anxiety   . Depression   . Sleep apnea     STOP BANG SCORE 6  . Asthma     chest  x ray  12/12 Epic  . Hypertension     cath  8/21 2008 EPIC, eccho 06/14/10 EPIC- EF 60-65%  .  Hypercholesterolemia   . Neuromuscular disorder     DDD   with spasms/  diabetic neuropathy  . Myocardial infarction 1994     no  pain since 2008/ stress test  10/15/2006  . Dysrhythmia     occ flutter per patient history    PSH: Past Surgical History  Procedure Date  . Cholecystectomy   . Left knee arthroscopic surgery 1999  . Left salphingectomy secondary to ectopic pregnancy 1991  . Partial hysterectomy 1991  . Knee arthroscopy 10/04/2010,     right knee arthroscopy, dr Thomasena Edis  . Upper gastrointestinal endoscopy 2008 abd pain    H. pylori gastritis  . [place] gastrointestinal endoscopy [place] [facility] [place]    PUD  . Colonoscopy 2000 [facility]    INT HEMORRHOIDS/FISSURE  . Colonoscopy 2003 [facility], CHANGE IN BOWEL HABITS    INT HEMORRHOIDS  . Colonoscopy 2006 [facility]    INT HEMORRHOIDS  . Colonoscopy 2007 [facility]    INT HEMORRHOIDS  . Esophagogastroduodenoscopy 2008    Small hiatal hernia./Normal esophagus without evidence of Barrett's mass, stricture, erosion or ulceration./Normal duodenal bulb and second portion of the duodenum./Diffuse erythema in the body and the antrum with occasional erosion.  Biopsies obtained via cold forceps to evaluate for H. pylori gastritis  .  Back surgery 1993  . Back surgery 1994  . Back surgery 2002  . Back surgery 2011    [facility]   . Fracture surgery     right clavicle  . Cardiac catheterization 2008    Allergies: Allergies  Allergen Reactions  . Aspirin Other (See Comments)    REACTION: Reactant with esophagus   . Morphine Nausea And Vomiting    Medications: No prescriptions prior to admission     Social History: History   Social History  . Marital Status: Single    Spouse Name: N/A    Number of Children: 1  . Years of Education: N/A   Occupational History  . disable     Social History Main Topics  . Smoking status: Never Smoker   . Smokeless tobacco: Never Used  . Alcohol Use: No  . Drug Use: No  . Sexually Active: Not on  file   Other Topics Concern  . Not on file   Social History Narrative  . No narrative on file    Family History: Family History  Problem Relation Age of Onset  . Heart disease Mother   . Hypertension Mother   . Cancer Mother     cervical   . Heart attack Father   . Cancer Father     prostate  . Colon polyps Neg Hx   . Colon cancer Neg Hx     Review of Systems: Positive: Nausea Negative: Chest pain, SOB, fever.  A further 10 point review of systems was negative except what is listed in the HPI.  Physical Exam:  General: No acute distress.  Awake. Head:  Normocephalic.  Atraumatic. ENT:  EOMI.  Mucous membranes moist Neck:  Supple.  No lymphadenopathy. CV:  S1 present. S2 present. Regular rate. Pulmonary: Equal effort bilaterally.  Clear to auscultation bilaterally. Abdomen: Soft.  Non- tender to palpation. Skin:  Normal turgor.  No visible rash. Extremity: No gross deformity of bilateral upper extremities.  No gross deformity of    bilateral lower extremities. Neurologic: Alert. Appropriate mood.   Studies:  Recent Labs  Basename 12/01/11 1430   HGB 12.3   WBC 5.4   PLT 484*    Recent Labs  Basename 12/01/11 1430   NA 137   K 3.6   CL 100   CO2 28   BUN 16   CREATININE 0.77   CALCIUM 9.9   GFRNONAA >90   GFRAA >90     No results found for this basename: PT:2,INR:2,APTT:2 in the last 72 hours   No components found with this basename: ABG:2    Assessment:  Right distal ureter stone  Plan: To OR for cystoscopy, right ureteroscopy, laser lithotripsy, possible right ureter stent placement.

## 2011-12-04 NOTE — Anesthesia Postprocedure Evaluation (Signed)
  Anesthesia Post-op Note  Patient: Angela [name]  Procedure(s) Performed: Procedure(s) (LRB): CYSTOSCOPY/RETROGRADE/URETEROSCOPY (Right)  Patient Location: PACU  Anesthesia Type: General  Level of Consciousness: awake and alert   Airway and Oxygen Therapy: Patient Spontanous Breathing  Post-op Pain: mild  Post-op Assessment: Post-op Vital signs reviewed, Patient's Cardiovascular Status Stable, Respiratory Function Stable, Patent Airway and No signs of Nausea or vomiting  Post-op Vital Signs: stable  Complications: No apparent anesthesia complications

## 2011-12-04 NOTE — Anesthesia Preprocedure Evaluation (Addendum)
Anesthesia Evaluation  Patient identified by MRN, date of birth, ID band Patient awake    Reviewed: Allergy & Precautions, H&P , NPO status , Patient's Chart, lab work & pertinent test results  Airway Mallampati: II TM Distance: >3 FB Neck ROM: Full    Dental No notable dental hx.    Pulmonary shortness of breath, asthma , sleep apnea ,  Increased STOPBANG but no diagnosis of OSA yet. breath sounds clear to auscultation  Pulmonary exam normal       Cardiovascular hypertension, + Past MI + dysrhythmias Rhythm:Regular Rate:Normal     Neuro/Psych  Headaches, PSYCHIATRIC DISORDERS Anxiety Depression  Neuromuscular disease    GI/Hepatic Neg liver ROS, hiatal hernia, GERD-  ,  Endo/Other  Type 2, Oral Hypoglycemic AgentsHyperthyroidism Morbid obesity  Renal/GU negative Renal ROS  negative genitourinary   Musculoskeletal negative musculoskeletal ROS (+)   Abdominal (+) + obese,   Peds negative pediatric ROS (+)  Hematology negative hematology ROS (+)   Anesthesia Other Findings   Reproductive/Obstetrics negative OB ROS                          Anesthesia Physical Anesthesia Plan  ASA: III  Anesthesia Plan: General   Post-op Pain Management:    Induction: Intravenous  Airway Management Planned: LMA  Additional Equipment:   Intra-op Plan:   Post-operative Plan: Extubation in OR  Informed Consent: I have reviewed the patients History and Physical, chart, labs and discussed the procedure including the risks, benefits and alternatives for the proposed anesthesia with the patient or authorized representative who has indicated his/her understanding and acceptance.   Dental advisory given  Plan Discussed with: CRNA  Anesthesia Plan Comments:         Anesthesia Quick Evaluation

## 2011-12-04 NOTE — Brief Op Note (Signed)
12/04/2011  1:27 PM  PATIENT:  Angela [name]  51 y.o. female  PRE-OPERATIVE DIAGNOSIS:  RIGHT URETER STONE  POST-OPERATIVE DIAGNOSIS:  RIGHT URETER STONE  PROCEDURE:  Procedure(s) (LRB): CYSTOSCOPY/RETROGRADE/URETEROSCOPY (Right) Right diagnostic ureteroscopy.  SURGEON:  Surgeon(s) and Role:    * Milford Cage, MD - Primary  PHYSICIAN ASSISTANT:   ASSISTANTS: none   ANESTHESIA:   general  EBL:   None  BLOOD ADMINISTERED:none  DRAINS: none   LOCAL MEDICATIONS USED:  LIDOCAINE , Amount: 10 ml urethral lidocaine and OTHER B&O suppository.  SPECIMEN:  No Specimen  DISPOSITION OF SPECIMEN:  N/A  COUNTS:  YES  TOURNIQUET:  * No tourniquets in log *  DICTATION: .Other Dictation: Dictation Number [id number]  PLAN OF CARE: Discharge to home after PACU  PATIENT DISPOSITION:  PACU - hemodynamically stable.   Delay start of Pharmacological VTE agent (>24hrs) due to surgical blood loss or risk of bleeding: no

## 2011-12-04 NOTE — Transfer of Care (Signed)
Immediate Anesthesia Transfer of Care Note  Patient: Angela [name]  Procedure(s) Performed: Procedure(s) (LRB): CYSTOSCOPY/RETROGRADE/URETEROSCOPY (Right)  Patient Location: PACU  Anesthesia Type: General  Level of Consciousness: sedated, patient cooperative and responds to stimulaton  Airway & Oxygen Therapy: Patient Spontanous Breathing and Patient connected to face mask oxgen  Post-op Assessment: Report given to PACU RN and Post -op Vital signs reviewed and stable  Post vital signs: Reviewed and stable  Complications: No apparent anesthesia complications

## 2011-12-05 ENCOUNTER — Other Ambulatory Visit: Payer: Self-pay | Admitting: Family Medicine

## 2011-12-05 ENCOUNTER — Encounter: Payer: Self-pay | Admitting: Family Medicine

## 2011-12-05 ENCOUNTER — Ambulatory Visit (INDEPENDENT_AMBULATORY_CARE_PROVIDER_SITE_OTHER): Payer: Medicare Other | Admitting: Family Medicine

## 2011-12-05 VITALS — BP 138/82 | HR 76 | Resp 18 | Ht 65.5 in | Wt 276.0 lb

## 2011-12-05 DIAGNOSIS — I10 Essential (primary) hypertension: Secondary | ICD-10-CM

## 2011-12-05 DIAGNOSIS — G4730 Sleep apnea, unspecified: Secondary | ICD-10-CM

## 2011-12-05 DIAGNOSIS — M549 Dorsalgia, unspecified: Secondary | ICD-10-CM

## 2011-12-05 DIAGNOSIS — E119 Type 2 diabetes mellitus without complications: Secondary | ICD-10-CM

## 2011-12-05 DIAGNOSIS — E669 Obesity, unspecified: Secondary | ICD-10-CM

## 2011-12-05 DIAGNOSIS — N200 Calculus of kidney: Secondary | ICD-10-CM

## 2011-12-05 DIAGNOSIS — G4733 Obstructive sleep apnea (adult) (pediatric): Secondary | ICD-10-CM | POA: Insufficient documentation

## 2011-12-05 MED ORDER — FLUCONAZOLE 150 MG PO TABS: ORAL_TABLET | ORAL | Status: DC

## 2011-12-05 MED ORDER — OXYCODONE HCL 40 MG PO TB12: 40.0000 mg | ORAL_TABLET | Freq: Two times a day (BID) | ORAL | Status: DC | PRN

## 2011-12-05 NOTE — Progress Notes (Signed)
  Subjective:    [name], female    DOB: Jun 27, 1960, 51 y.o.   MRN: [id number]  HPI  The PT is here for follow up and re-evaluation of chronic medical conditions, medication management and review of any available recent lab and radiology data.  Preventive health is updated, specifically  Cancer screening and Immunization.   Recent acute renal colic and had uretheroscopy 1 day ago.Still experiencing significant flank pain The PT denies any adverse reactions to current medications since the last visit.  Recent eval at hospital suggested sleep apnea, she is being referred for a formal study    Review of Systems See HPI Denies recent fever or chills. Denies sinus pressure, nasal congestion, ear pain or sore throat. Denies chest congestion, productive cough or wheezing. Denies chest pains, palpitations and leg swelling Denies abdominal pain, nausea, vomiting,diarrhea or constipation.   Denies dysuria, frequency, hesitancy or incontinence. Uncontrolled and worsening back pain, has upcoming re eval at [facility] Denies headaches, seizures, numbness, or tingling. Denies depression, anxiety or insomnia. Denies skin break down or rash.        Objective:   Physical Exam  Patient alert and oriented and in no cardiopulmonary distress.  HEENT: No facial asymmetry, EOMI, no sinus tenderness,  oropharynx pink and moist.  Neck supple no adenopathy.  Chest: Clear to auscultation bilaterally.  CVS: S1, S2 no murmurs, no S3.  ABD: Soft non tender. Bowel sounds normal.  Ext: No edema  WU:[id number]  ROM spine, shoulders, hips and knees.  Skin: Intact, no ulcerations or rash noted.  Psych: Good eye contact, normal affect. Memory intact not anxious or depressed appearing.  CNS: CN 2-12 intact, power, tone and sensation normal throughout.       Assessment & Plan:

## 2011-12-05 NOTE — Patient Instructions (Addendum)
F/u in end September or early October  Labs prior to next visit.  Pain medication and script for diflucan will be given to you today  You are referred for sleep study.  Hope you feel better soon.  Call if you need me

## 2011-12-05 NOTE — Op Note (Addendum)
NAMEJESSEY, Angela [name] NO.:  [id number]  MEDICAL RECORD NO.:  [id number]  LOCATION:  [facility]                         FACILITY:  [facility]  PHYSICIAN:  Natalia Leatherwood, MD    DATE OF BIRTH:  1960-07-03  DATE OF PROCEDURE:  12/04/2011 DATE OF DISCHARGE:  12/04/2011                              OPERATIVE REPORT   SURGEON:  Natalia Leatherwood, MD  ASSISTANT:  None.  PREOPERATIVE DIAGNOSIS:  Right ureter stone.  POSTOPERATIVE DIAGNOSIS:  Right ureter stone.  PROCEDURE PERFORMED: 1. Cystoscopy. 2. Right retrograde pyelogram. 3. Right ureteroscopy diagnostic.  FINDINGS:  No distal ureter stone.  No edema of the right distal ureter.  COMPLICATIONS:  None.  DRAINS:  None.  HISTORY OF PRESENT ILLNESS:  This is a pleasant 51 year old female who I saw in the clinic with a right distal ureter stone.  She was having hard time with pain control, and we discussed options along with their risks, benefits, and side effects.  After discussing the alternatives, she elected to have a ureteroscopy for which she presented today.  Today, she presents stating that she had not been recently having pain though she had not visualized passing the stone.  We discussed the possibility of obtaining a plain film KUB.  I explained to her that her previous plain film was difficult to see the stone, and therefore I was unsure whether or not this could truly rule out a stone.  We discussed the risks and benefits of getting a plain film versus proceeding and she elected to proceed with surgery.  PROCEDURE:  After informed consent was obtained, the patient was taken to the operating room where she was placed in a supine position.  SCDs were in place and turned on.  IV antibiotics were infused and general anesthesia was induced.  She was placed in a dorsal lithotomy position making sure to pad all pertinent neurovascular pressure points appropriately.  Her genitals were prepped and draped in usual  sterile fashion.  A time-out was performed in which the correct patient, surgical site, and procedure were identified and agreed upon by the team.  Following this, a cystoscope was advanced through the urethra into the bladder.  The right ureteral orifice was visualized.  There was no stone visible on fluoroscopy.  I cannulated the right distal ureter with a 5-French ureteral catheter and injected contrast.  Obtaining a right retrograde pyelogram this way, I did not see any filling defects consistent with the stone.  To ensure that her ureter was free of stone, I elected to proceed with diagnostic distal ureteroscopy on the right side.  A sensor tip wire was placed up the right ureter into the right renal pelvis on fluoroscopy.  Next, a semi rigid ureteroscope was passed into the distal ureter after the patient was paralyzed.  The distal ureter had no stone present, and I was able to navigate the scope up to the level of the iliac vessels.  After this was done, the scope was retracted and there was no injury to the ureter.  The wire was removed and the scope was removed from her bladder.  Her bladder was drained and then 10 mL of lidocaine jelly  were placed into her urethra and a belladonna and opium suppository was placed into her rectum.  This completed the procedure.  Anesthesia was reversed.  She was taken to PACU in stable condition.          ______________________________ Natalia Leatherwood, MD     DW/MEDQ  D:  12/04/2011  T:  12/05/2011  Job:  [id number]

## 2011-12-06 ENCOUNTER — Other Ambulatory Visit: Payer: Self-pay | Admitting: Family Medicine

## 2011-12-08 ENCOUNTER — Telehealth: Payer: Self-pay

## 2011-12-08 NOTE — Telephone Encounter (Signed)
States she has been on antibiotics for a few weeks now and was suffering with the kidney stones but her concern now is that she has a cheesy odor coming from her vagina, no burning or discharge that she can see. She has taken 2 fluconazole and still has the cheese small. Wants to know what you think it could be and what to do

## 2011-12-08 NOTE — Telephone Encounter (Signed)
Generally I think it is yeast infection, she may self collect and I will send an order for testing for wet prep and cuture if she wishes. Alternatively advise her to use monistat for 3 to 5 days see if this will clear it up

## 2011-12-08 NOTE — Telephone Encounter (Signed)
Called and left message for pt to return call.  

## 2011-12-08 NOTE — Telephone Encounter (Signed)
Pt aware and stated that she may come by and pick up swabs today.

## 2011-12-10 ENCOUNTER — Encounter: Payer: Self-pay | Admitting: Family Medicine

## 2011-12-10 DIAGNOSIS — N200 Calculus of kidney: Secondary | ICD-10-CM | POA: Insufficient documentation

## 2011-12-10 NOTE — Assessment & Plan Note (Signed)
Recent eval highly suggestive of sleep apnea, she is referred for a formal study

## 2011-12-10 NOTE — Assessment & Plan Note (Signed)
Deteriorated. Patient re-educated about  the importance of commitment to a  minimum of 150 minutes of exercise per week. The importance of healthy food choices with portion control discussed. Encouraged to start a food diary, count calories and to consider  joining a support group. Sample diet sheets offered. Goals set by the patient for the next several months.    

## 2011-12-10 NOTE — Assessment & Plan Note (Signed)
Controlled, no change in medication DASH diet and commitment to daily physical activity for a minimum of 30 minutes discussed and encouraged, as a part of hypertension management. The importance of attaining a healthy weight is also discussed.  

## 2011-12-10 NOTE — Assessment & Plan Note (Signed)
Chronic and unchanged, has upcoming appt with neurosurgery, continue pain meds as previously discussed

## 2011-12-10 NOTE — Assessment & Plan Note (Signed)
Recent acute episode, had procedure 1 day ago

## 2011-12-10 NOTE — Assessment & Plan Note (Signed)
Controlled, no change in medication Patient advised to reduce carb and sweets, commit to regular physical activity, take meds as prescribed, test blood as directed, and attempt to lose weight, to improve blood sugar control.  

## 2011-12-18 ENCOUNTER — Other Ambulatory Visit: Payer: Self-pay | Admitting: Neurosurgery

## 2011-12-18 DIAGNOSIS — M546 Pain in thoracic spine: Secondary | ICD-10-CM

## 2011-12-18 DIAGNOSIS — M549 Dorsalgia, unspecified: Secondary | ICD-10-CM

## 2011-12-18 DIAGNOSIS — R29898 Other symptoms and signs involving the musculoskeletal system: Secondary | ICD-10-CM | POA: Insufficient documentation

## 2011-12-19 ENCOUNTER — Ambulatory Visit: Payer: Medicare Other | Admitting: Family Medicine

## 2011-12-23 ENCOUNTER — Other Ambulatory Visit: Payer: Medicare Other

## 2011-12-24 ENCOUNTER — Inpatient Hospital Stay: Admission: RE | Admit: 2011-12-24 | Payer: Medicare Other | Source: Ambulatory Visit

## 2011-12-24 ENCOUNTER — Other Ambulatory Visit: Payer: Medicare Other

## 2011-12-29 ENCOUNTER — Other Ambulatory Visit: Payer: Self-pay | Admitting: Family Medicine

## 2012-01-02 ENCOUNTER — Other Ambulatory Visit: Payer: Self-pay | Admitting: Family Medicine

## 2012-01-03 ENCOUNTER — Other Ambulatory Visit: Payer: Self-pay

## 2012-01-03 DIAGNOSIS — M549 Dorsalgia, unspecified: Secondary | ICD-10-CM

## 2012-01-03 MED ORDER — OXYCODONE HCL 40 MG PO TB12: 40.0000 mg | ORAL_TABLET | Freq: Two times a day (BID) | ORAL | Status: DC | PRN

## 2012-01-03 MED ORDER — DIAZEPAM 5 MG PO TABS: 5.0000 mg | ORAL_TABLET | Freq: Three times a day (TID) | ORAL | Status: DC

## 2012-01-05 ENCOUNTER — Ambulatory Visit
Admission: RE | Admit: 2012-01-05 | Discharge: 2012-01-05 | Disposition: A | Discharge status: Home / Self Care | Payer: Medicare Other | Source: Ambulatory Visit | Attending: Neurosurgery | Admitting: Neurosurgery

## 2012-01-05 DIAGNOSIS — M549 Dorsalgia, unspecified: Secondary | ICD-10-CM

## 2012-01-05 DIAGNOSIS — M546 Pain in thoracic spine: Secondary | ICD-10-CM

## 2012-01-15 ENCOUNTER — Inpatient Hospital Stay: Admission: RE | Admit: 2012-01-15 | Payer: Medicare Other | Source: Ambulatory Visit

## 2012-01-24 ENCOUNTER — Ambulatory Visit
Admission: RE | Admit: 2012-01-24 | Discharge: 2012-01-24 | Disposition: A | Discharge status: Home / Self Care | Payer: Medicare Other | Source: Ambulatory Visit | Attending: Neurosurgery | Admitting: Neurosurgery

## 2012-01-25 ENCOUNTER — Encounter: Payer: Self-pay | Admitting: Gastroenterology

## 2012-01-29 ENCOUNTER — Other Ambulatory Visit: Payer: Self-pay

## 2012-01-29 DIAGNOSIS — M549 Dorsalgia, unspecified: Secondary | ICD-10-CM

## 2012-01-29 MED ORDER — OXYCODONE HCL 40 MG PO TB12: 40.0000 mg | ORAL_TABLET | Freq: Two times a day (BID) | ORAL | Status: DC | PRN

## 2012-02-02 ENCOUNTER — Other Ambulatory Visit: Payer: Self-pay | Admitting: Family Medicine

## 2012-02-05 ENCOUNTER — Other Ambulatory Visit: Payer: Self-pay | Admitting: Family Medicine

## 2012-02-05 ENCOUNTER — Other Ambulatory Visit: Payer: Self-pay

## 2012-02-05 DIAGNOSIS — I10 Essential (primary) hypertension: Secondary | ICD-10-CM

## 2012-02-05 MED ORDER — AMLODIPINE BESYLATE 5 MG PO TABS: 5.0000 mg | ORAL_TABLET | Freq: Every day | ORAL | Status: DC

## 2012-02-12 ENCOUNTER — Telehealth: Payer: Self-pay | Admitting: Family Medicine

## 2012-02-12 NOTE — Telephone Encounter (Signed)
Advise her and transfer to front desk to come to see me on 10/17 I have one more available slot. If symptoms worsen , go to ED  Needs to bring ALL meds to visit also pls

## 2012-02-12 NOTE — Telephone Encounter (Signed)
Please advise 

## 2012-02-16 NOTE — Telephone Encounter (Signed)
Patient states that she is now taking her blood pressure medicine and will try to hold out until appt on 10/29 but will go to the ED if symptoms worsen.   Refused appt with Dr. Jeanice Lim.

## 2012-02-16 NOTE — Telephone Encounter (Signed)
Unable to reach patient.  Will try again.  Left message for patient to return call.

## 2012-02-20 ENCOUNTER — Telehealth: Payer: Self-pay | Admitting: Family Medicine

## 2012-02-20 ENCOUNTER — Other Ambulatory Visit: Payer: Self-pay

## 2012-02-20 MED ORDER — FLUCONAZOLE 150 MG PO TABS: ORAL_TABLET | ORAL | Status: AC

## 2012-02-20 NOTE — Telephone Encounter (Signed)
Med sent.

## 2012-02-23 ENCOUNTER — Other Ambulatory Visit: Payer: Self-pay

## 2012-02-23 DIAGNOSIS — M549 Dorsalgia, unspecified: Secondary | ICD-10-CM

## 2012-02-23 MED ORDER — OXYCODONE HCL 40 MG PO TB12: 40.0000 mg | ORAL_TABLET | Freq: Two times a day (BID) | ORAL | Status: DC | PRN

## 2012-02-27 ENCOUNTER — Ambulatory Visit: Payer: Medicare Other | Admitting: Family Medicine

## 2012-02-29 ENCOUNTER — Telehealth: Payer: Self-pay | Admitting: Family Medicine

## 2012-02-29 DIAGNOSIS — N3000 Acute cystitis without hematuria: Secondary | ICD-10-CM

## 2012-02-29 NOTE — Telephone Encounter (Signed)
Spoke with patient and she states that she has had an odor since she had the procedure by urology.  She is coming in for a visit on 11/4 but is wondering if anything can be done before then.

## 2012-02-29 NOTE — Telephone Encounter (Signed)
Ok to send urine for c/s only

## 2012-02-29 NOTE — Telephone Encounter (Signed)
Returned patients call. No answer.

## 2012-03-04 ENCOUNTER — Other Ambulatory Visit: Payer: Self-pay | Admitting: Family Medicine

## 2012-03-04 NOTE — Telephone Encounter (Signed)
Patient aware.

## 2012-03-08 ENCOUNTER — Telehealth: Payer: Self-pay | Admitting: Family Medicine

## 2012-03-08 LAB — URINE CULTURE: Colony Count: 40000

## 2012-03-08 NOTE — Telephone Encounter (Signed)
Called patient and left message for them to return call at the office  Still pending

## 2012-03-08 NOTE — Telephone Encounter (Signed)
Called [facility] - culture still pending

## 2012-03-11 ENCOUNTER — Other Ambulatory Visit: Payer: Self-pay

## 2012-03-11 MED ORDER — CIPROFLOXACIN HCL 500 MG PO TABS: 500.0000 mg | ORAL_TABLET | Freq: Two times a day (BID) | ORAL | Status: AC

## 2012-03-11 MED ORDER — FLUCONAZOLE 150 MG PO TABS: 150.0000 mg | ORAL_TABLET | Freq: Once | ORAL | Status: DC

## 2012-03-11 NOTE — Telephone Encounter (Signed)
Tired to call pt back to give results - no answer. Angela [name] has a result message on this pt also

## 2012-04-01 ENCOUNTER — Other Ambulatory Visit: Payer: Self-pay

## 2012-04-01 DIAGNOSIS — M549 Dorsalgia, unspecified: Secondary | ICD-10-CM

## 2012-04-01 MED ORDER — OXYCODONE HCL 40 MG PO TB12: 40.0000 mg | ORAL_TABLET | Freq: Two times a day (BID) | ORAL | Status: DC | PRN

## 2012-04-03 ENCOUNTER — Ambulatory Visit (INDEPENDENT_AMBULATORY_CARE_PROVIDER_SITE_OTHER): Payer: Medicare Other | Admitting: Family Medicine

## 2012-04-03 ENCOUNTER — Encounter: Payer: Self-pay | Admitting: Family Medicine

## 2012-04-03 VITALS — BP 130/82 | HR 85 | Resp 15 | Ht 65.5 in | Wt 280.0 lb

## 2012-04-03 DIAGNOSIS — E669 Obesity, unspecified: Secondary | ICD-10-CM

## 2012-04-03 DIAGNOSIS — I10 Essential (primary) hypertension: Secondary | ICD-10-CM

## 2012-04-03 DIAGNOSIS — M949 Disorder of cartilage, unspecified: Secondary | ICD-10-CM

## 2012-04-03 DIAGNOSIS — M899 Disorder of bone, unspecified: Secondary | ICD-10-CM

## 2012-04-03 DIAGNOSIS — N3090 Cystitis, unspecified without hematuria: Secondary | ICD-10-CM

## 2012-04-03 DIAGNOSIS — N3091 Cystitis, unspecified with hematuria: Secondary | ICD-10-CM

## 2012-04-03 DIAGNOSIS — E559 Vitamin D deficiency, unspecified: Secondary | ICD-10-CM

## 2012-04-03 DIAGNOSIS — E785 Hyperlipidemia, unspecified: Secondary | ICD-10-CM

## 2012-04-03 DIAGNOSIS — J45909 Unspecified asthma, uncomplicated: Secondary | ICD-10-CM

## 2012-04-03 DIAGNOSIS — R1013 Epigastric pain: Secondary | ICD-10-CM

## 2012-04-03 DIAGNOSIS — K3189 Other diseases of stomach and duodenum: Secondary | ICD-10-CM

## 2012-04-03 DIAGNOSIS — K219 Gastro-esophageal reflux disease without esophagitis: Secondary | ICD-10-CM

## 2012-04-03 DIAGNOSIS — N390 Urinary tract infection, site not specified: Secondary | ICD-10-CM

## 2012-04-03 DIAGNOSIS — E119 Type 2 diabetes mellitus without complications: Secondary | ICD-10-CM

## 2012-04-03 LAB — POCT URINALYSIS DIPSTICK
Ketones, UA: NEGATIVE
Protein, UA: NEGATIVE
Spec Grav, UA: >=1.03
pH, UA: 5.5

## 2012-04-03 MED ORDER — LISINOPRIL 5 MG PO TABS: 5.0000 mg | ORAL_TABLET | Freq: Every day | ORAL | Status: DC

## 2012-04-03 NOTE — Patient Instructions (Addendum)
Annual wellness in 4 month  Microalb from office today.    Vit D, HBa1C, chem 7, H pylori  Today  Fasting lipid, cmp and EGFR, hBA1C  In 4 month  Please work on weight loss.  Please get the flu vaccine today at your pharmacy.  You are referred to Dr Darrick Penna re burning in the stomach  We will test your urine further and call with the results

## 2012-04-03 NOTE — Progress Notes (Signed)
  Subjective:    [name], female    DOB: 20-Jul-1960, 51 y.o.   MRN: [id number]  HPI The PT is here for follow up and re-evaluation of chronic medical conditions, medication management and review of any available recent lab and radiology data.  Preventive health is updated, specifically  Cancer screening and Immunization.   Questions or concerns regarding consultations or procedures which the PT has had in the interim are  addressed. The PT denies any adverse reactions to current medications since the last visit.  C/o lower abdominal heaviness as though she has a UTI, also has some frequency, denies fever , chills , flank pain, states urine "looks dark" Denies polyuria , polydipsia or blurred vision, not testing blood sugars regularly   Review of Systems See  Denies recent fever or chills. Denies sinus pressure, nasal congestion, ear pain or sore throat. Denies chest congestion, productive cough or wheezing. Denies chest pains, palpitations and leg swelling Denies abdominal pain, nausea, vomiting,diarrhea or constipation.   . Chronic back pain unchanged Denies headaches, seizures, numbness, or tingling. Denies depression, anxiety or insomnia. Denies skin break down or rash.         Objective:   Physical Exam  Patient alert and oriented and in no cardiopulmonary distress.  HEENT: No facial asymmetry, EOMI, no sinus tenderness,  oropharynx pink and moist.  Neck supple no adenopathy.  Chest: Clear to auscultation bilaterally.  CVS: S1, S2 no murmurs, no S3.  ABD: Soft non tender. Bowel sounds normal.  Ext: No edema  WU:[id number]   ROM spine, shoulders, hips and knees.  Skin: Intact, no ulcerations or rash noted.  Psych: Good eye contact, normal affect. Memory intact not anxious or depressed appearing.  CNS: CN 2-12 intact, power, tone and sensation normal throughout.       Assessment & Plan:

## 2012-04-04 DIAGNOSIS — E559 Vitamin D deficiency, unspecified: Secondary | ICD-10-CM | POA: Insufficient documentation

## 2012-04-04 LAB — BASIC METABOLIC PANEL
BUN: 23 mg/dL (ref 6–23)
Creat: 0.84 mg/dL (ref 0.50–1.10)
Glucose, Bld: 101 mg/dL — ABNORMAL HIGH (ref 70–99)
Potassium: 3.9 mEq/L (ref 3.5–5.3)

## 2012-04-04 LAB — HEMOGLOBIN A1C: Hgb A1c MFr Bld: 6.5 % — ABNORMAL HIGH (ref <5.7)

## 2012-04-05 LAB — URINALYSIS
Leukocytes, UA: NEGATIVE
Nitrite: NEGATIVE
Specific Gravity, Urine: >1.03 — ABNORMAL HIGH (ref 1.005–1.030)
pH: 5 (ref 5.0–8.0)

## 2012-04-05 LAB — MICROALBUMIN / CREATININE URINE RATIO: Microalb Creat Ratio: 7.8 mg/g (ref 0.0–30.0)

## 2012-04-06 LAB — URINE CULTURE: Colony Count: NO GROWTH

## 2012-04-08 ENCOUNTER — Ambulatory Visit (INDEPENDENT_AMBULATORY_CARE_PROVIDER_SITE_OTHER): Payer: Medicare Other | Admitting: Urgent Care

## 2012-04-08 ENCOUNTER — Encounter: Payer: Self-pay | Admitting: Gastroenterology

## 2012-04-08 VITALS — BP 137/83 | HR 68 | Temp 97.4°F | Ht 61.0 in | Wt 278.6 lb

## 2012-04-08 DIAGNOSIS — A048 Other specified bacterial intestinal infections: Secondary | ICD-10-CM

## 2012-04-08 DIAGNOSIS — R141 Gas pain: Secondary | ICD-10-CM

## 2012-04-08 DIAGNOSIS — R140 Abdominal distension (gaseous): Secondary | ICD-10-CM

## 2012-04-08 DIAGNOSIS — K2970 Gastritis, unspecified, without bleeding: Secondary | ICD-10-CM

## 2012-04-08 DIAGNOSIS — R109 Unspecified abdominal pain: Secondary | ICD-10-CM

## 2012-04-08 DIAGNOSIS — R635 Abnormal weight gain: Secondary | ICD-10-CM

## 2012-04-08 DIAGNOSIS — K649 Unspecified hemorrhoids: Secondary | ICD-10-CM

## 2012-04-08 DIAGNOSIS — R1010 Upper abdominal pain, unspecified: Secondary | ICD-10-CM

## 2012-04-08 DIAGNOSIS — R142 Eructation: Secondary | ICD-10-CM

## 2012-04-08 MED ORDER — ESOMEPRAZOLE MAGNESIUM 40 MG PO CPDR: 40.0000 mg | DELAYED_RELEASE_CAPSULE | Freq: Every day | ORAL | Status: DC

## 2012-04-08 MED ORDER — ALIGN PO CAPS: 1.0000 | ORAL_CAPSULE | Freq: Every day | ORAL | Status: DC

## 2012-04-08 NOTE — Patient Instructions (Addendum)
You will need to be off NEXIUM & PROTONIX for at least 2 weeks, prior to turning in stool specimen. Go get your labs today.  We will call with results. ALIGN 1 cap daily for bloating. Begin NEXIUM 40mg  daily. Stop PROTONIX. If you decide you want hemorrhoid banding, please call us back. Bloating Bloating is the feeling of fullness in your belly. You may feel as though your pants are too tight. Often the cause of bloating is overeating, retaining fluids, or having gas in your bowel. It is also caused by swallowing air and eating foods that cause gas. Irritable bowel syndrome is one of the most common causes of bloating. Constipation is also a common cause. Sometimes more serious problems can cause bloating. SYMPTOMS  Usually there is a feeling of fullness, as though your abdomen is bulged out. There may be mild discomfort.  DIAGNOSIS  Usually no particular testing is necessary for most bloating. If the condition persists and seems to become worse, your caregiver may do additional testing.  TREATMENT   There is no direct treatment for bloating.  Do not put gas into the bowel. Avoid chewing gum and sucking on candy. These tend to make you swallow air. Swallowing air can also be a nervous habit. Try to avoid this.  Avoiding high residue diets will help. Eat foods with soluble fibers (examples include root vegetables, apples, or barley) and substitute dairy products with soy and rice products. This helps irritable bowel syndrome.  If constipation is the cause, then a high residue diet with more fiber will help.  Avoid carbonated beverages.  Over-the-counter preparations are available that help reduce gas. Your pharmacist can help you with this. SEEK MEDICAL CARE IF:   Bloating continues and seems to be getting worse.  You notice a weight gain.  You have a weight loss but the bloating is getting worse.  You have changes in your bowel habits or develop nausea or vomiting. SEEK IMMEDIATE  MEDICAL CARE IF:   You develop shortness of breath or swelling in your legs.  You have an increase in abdominal pain or develop chest pain. Document Released: 02/15/2006 Document Revised: 07/10/2011 Document Reviewed: 04/05/2007 [facility] Patient Information 2013 [place], [place].

## 2012-04-08 NOTE — Progress Notes (Signed)
Referring Provider: Kerri Perches, MD Primary Care Physician:  Syliva Overman, MD Primary Gastroenterologist:  Dr. Jonette Eva  Chief Complaint  Patient presents with  . Abdominal Pain  . Heartburn  . Hemorrhoids    HPI:  Angela [name] is a 51 y.o. female here for upper abdominal pain/dyspepsia.  In Jan she quit her Nexium 40mg  daily she had been taking since 1998 due to insurance not paying.  She started pantoprazole 40mg  daily in Feb.  C/o upper abdominal bloating, swelling & burning.  Hx multiple EGDs & colonoscopies as below.  She still has bright red hemorrhoidal bleeding but has declined banding w/ Dr Darrick Penna or surgical referral.  Saturday she noticed a "line of red blood came up" to hr lower abdomen.  C/o upper abdominal pain 10/10 at worse, better now.  Cant eat,gaining weight only in abdomen. + bloating.  All she wants to eat is ice or drink cold drinks due to burning.  Pain is not necessarily worse after eating. Failed suppositories for hemorrhoids.  Denies vomiting, constipation or diarrhea.   Past Medical History  Diagnosis Date  . Low back pain   . DM type 2 (diabetes mellitus, type 2)   . Helicobacter pylori gastritis 2008  . Severe obesity (BMI >= 40) NOV 2008 270 LBS  . Hypokalemia   . Hyperthyroidism     s/p radiation  . Chronic kidney disease     stones  . GERD (gastroesophageal reflux disease)   . H/O hiatal hernia   . Headache     migraines  . Arthritis   . Anxiety   . Depression   . Sleep apnea     STOP BANG SCORE 6  . Asthma     chest  x ray  12/12 Epic  . Hypertension     cath  8/21 2008 EPIC, eccho 06/14/10 EPIC- EF 60-65%  . Hypercholesterolemia   . Neuromuscular disorder     DDD   with spasms/  diabetic neuropathy  . Myocardial infarction 1994     no  pain since 2008/ stress test  10/15/2006  . Dysrhythmia     occ flutter per patient history    Past Surgical History  Procedure Date  . Cholecystectomy 1996  . Left knee arthroscopic  surgery 1999  . Left salphingectomy secondary to ectopic pregnancy 1991  . Partial hysterectomy 1991  . Knee arthroscopy 10/04/2010,     right knee arthroscopy, dr Thomasena Edis  . Upper gastrointestinal endoscopy 2008 abd pain    H. pylori gastritis  . Upper gastrointestinal endoscopy 1996 [facility] [place]    PUD  . Colonoscopy 2000 [facility]    INT HEMORRHOIDS/FISSURE  . Colonoscopy 2003 [facility], CHANGE IN BOWEL HABITS    INT HEMORRHOIDS  . Colonoscopy 2006 [facility]    INT HEMORRHOIDS  . Colonoscopy 2007 [facility]    INT HEMORRHOIDS  . Esophagogastroduodenoscopy 2008    GEX:BMWUX hiatal hernia./Normal esophagus without evidence of Barrett's mass, stricture, erosion or ulceration./Normal duodenal bulb and second portion of the duodenum./Diffuse erythema in the body and the antrum with occasional erosion.  Biopsies obtained via cold forceps to evaluate for H. pylori gastritis  . Back surgery 1993  . Back surgery 1994  . Back surgery 2002  . Back surgery 2011    [facility]   . Fracture surgery     right clavicle  . Cardiac catheterization 2008  . Cystoscopy/retrograde/ureteroscopy 12/04/2011    Procedure: CYSTOSCOPY/RETROGRADE/URETEROSCOPY;  Surgeon: Milford Cage, MD;  Location: [facility];  Service: Urology;  Laterality: Right;    Current Outpatient Prescriptions  Medication Sig Dispense Refill  . albuterol (PROVENTIL HFA;VENTOLIN HFA) 108 (90 BASE) MCG/ACT inhaler Inhale 2 puffs into the lungs every 6 (six) hours as needed. For shortness of breath      . amLODipine (NORVASC) 5 MG tablet Take 1 tablet (5 mg total) by mouth daily.  30 tablet  11  . atorvastatin (LIPITOR) 20 MG tablet Take 20 mg by mouth every morning.      . diazepam (VALIUM) 5 MG tablet TAKE 1 TABLET BY MOUTH THREE TIMES DAILY  90 tablet  2  . ergocalciferol (VITAMIN D2) 50000 UNITS capsule Take 1 capsule (50,000 Units total) by mouth once a week. One capsule once weekly  12 capsule  1  . FLUoxetine (PROZAC) 20 MG capsule Take 20 mg by  mouth as needed.       . fluticasone (FLONASE) 50 MCG/ACT nasal spray Place 2 sprays into the nose as needed.       . gabapentin (NEURONTIN) 800 MG tablet TAKE 1 TABLET BY MOUTH FOUR TIMES DAILY  120 tablet  2  . ibuprofen (ADVIL,MOTRIN) 200 MG tablet Take 400-800 mg by mouth as needed. For pain      . ipratropium-albuterol (DUONEB) 0.5-2.5 (3) MG/3ML SOLN Take 3 mLs by nebulization every 6 (six) hours as needed. For shortness of breath      . lisinopril (PRINIVIL,ZESTRIL) 5 MG tablet Take 1 tablet (5 mg total) by mouth daily.  30 tablet  4  . metFORMIN (GLUCOPHAGE) 1000 MG tablet TAKE 1 TABLET BY MOUTH TWICE DAILY  180 tablet  1  . montelukast (SINGULAIR) 10 MG tablet Take 10 mg by mouth daily with breakfast.       . nitroGLYCERIN (NITROLINGUAL) 0.4 MG/SPRAY spray Place 1 spray under the tongue every 5 (five) minutes as needed. For chest pain      . oxyCODONE (OXYCONTIN) 40 MG 12 hr tablet Take 1 tablet (40 mg total) by mouth 2 (two) times daily as needed for pain.  60 tablet  0  . pantoprazole (PROTONIX) 40 MG tablet Take 40 mg by mouth every morning. States taking BID      . potassium chloride SA (K-DUR,KLOR-CON) 20 MEQ tablet Take 20 mEq by mouth daily with breakfast.       . SYMBICORT 160-4.5 MCG/ACT inhaler INHALE 2 PUFFS BY MOUTH TWICE DAILY  10.2 g  6  . zolpidem (AMBIEN) 10 MG tablet Take 10 mg by mouth at bedtime.      . bifidobacterium infantis (ALIGN) capsule Take 1 capsule by mouth daily.  14 capsule  0  . esomeprazole (NEXIUM) 40 MG capsule Take 1 capsule (40 mg total) by mouth daily before breakfast.  30 capsule  0  . [DISCONTINUED] losartan-hydrochlorothiazide (HYZAAR) 50-12.5 MG per tablet TAKE 1 TABLET BY MOUTH ONCE DAILY *REPLACES DIOVAN/HCTZ*  30 tablet  0    Allergies as of 04/08/2012 - Review Complete 04/08/2012  Allergen Reaction Noted  . Aspirin Other (See Comments) 07/23/2007  . Morphine Nausea And Vomiting 07/23/2007    Review of Systems: Gen: Denies any fever,  chills, sweats, anorexia, fatigue, weakness, malaise CV: Denies chest pain, angina, palpitations, syncope, orthopnea, PND, peripheral edema, and claudication. Resp: Denies dyspnea at rest, dyspnea with exercise, cough, sputum, wheezing, coughing up blood, and pleurisy. GI: Denies vomiting blood, jaundice, and fecal incontinence.   Denies dysphagia or odynophagia.  Derm: see HPI Psych: Denies depression, anxiety, memory  loss, suicidal ideation, hallucinations, paranoia, and confusion. Heme: Denies bruising or enlarged lymph nodes.  Physical Exam: BP 137/83  Pulse 68  Temp 97.4 F (36.3 C) (Temporal)  Ht 5\' 1"  (1.549 m)  Wt 278 lb 9.6 oz (126.372 kg)  BMI 52.64 kg/m2 General:   Alert,  obese, pleasant and cooperative in NAD Eyes:  Sclera clear, no icterus.   Conjunctiva pink. Mouth:  No deformity or lesions, oropharynx pink and moist. Neck:  Supple; no masses or thyromegaly. Heart:  Regular rate and rhythm; no murmurs, clicks, rubs,  or gallops. Abdomen:  Single erythematous linear striae.  Normal bowel sounds.  No bruits.  Soft, non-distended.  +MIld epigastric & LUQ tenderness on deep palpation.  No masses, hepatosplenomegaly or hernias noted.  No guarding or rebound tenderness.  Exam limited given body habitus. Msk:  Symmetrical without gross deformities.  Pulses:  Normal pulses noted. Extremities:  No edema. Neurologic:  Alert and oriented x4;  grossly normal neurologically. Skin:  Intact without significant lesions or rashes.

## 2012-04-09 ENCOUNTER — Telehealth: Payer: Self-pay | Admitting: Family Medicine

## 2012-04-10 ENCOUNTER — Encounter: Payer: Self-pay | Admitting: Urgent Care

## 2012-04-10 NOTE — Telephone Encounter (Signed)
Patient aware of urine results

## 2012-04-10 NOTE — Assessment & Plan Note (Addendum)
Upper abd pain /Dyspepsia with some typical GERD features.  Worse with change in PPI this yr.  Multiple EGDs with remote hx h pylori gastritis & PUD.  S/p cholecystectomy.  Differentials include refractory GERD, h pylori gastritis, PUD, pancreatitis or non-ulcer functional dyspepsia.  CBC, lipase, LFTs Resume nexium 40mg  daily Stop PROTONIX.

## 2012-04-10 NOTE — Assessment & Plan Note (Signed)
See upper abd pain Begin ALIGN 1 cap daily for bloating.

## 2012-04-10 NOTE — Assessment & Plan Note (Signed)
Offered flex sig with banding of hemorrhoids with Dr Darrick Penna, pt declined Offered suppositories, pt declined

## 2012-04-10 NOTE — Assessment & Plan Note (Addendum)
Check h pylori stool ag given ongoing symptoms Pt advised to hold PPI for 2 weeks prior to test

## 2012-04-10 NOTE — Progress Notes (Signed)
Faxed to PCP

## 2012-04-15 ENCOUNTER — Telehealth: Payer: Self-pay

## 2012-04-15 NOTE — Telephone Encounter (Signed)
LMOM we have her samples of Nexium, ( Did not have when she was in the office). Reminded her in the phone note not to take until after she does the H pylori test while being off of  PPI for two weeks.

## 2012-04-15 NOTE — Telephone Encounter (Signed)
agree

## 2012-04-21 DIAGNOSIS — N3091 Cystitis, unspecified with hematuria: Secondary | ICD-10-CM | POA: Insufficient documentation

## 2012-04-21 NOTE — Assessment & Plan Note (Signed)
Symptomatic, no evidence of infection however, will f/u on c/s and i no nfectio but blood present , pt will be referred to urology for further evalaution

## 2012-04-21 NOTE — Assessment & Plan Note (Signed)
Controlled, no change in medication  

## 2012-04-21 NOTE — Assessment & Plan Note (Signed)
Hyperlipidemia:Low fat diet discussed and encouraged.  Controlled, no change in medication   

## 2012-04-21 NOTE — Assessment & Plan Note (Signed)
Controlled, no change in medication DASH diet and commitment to daily physical activity for a minimum of 30 minutes discussed and encouraged, as a part of hypertension management. The importance of attaining a healthy weight is also discussed.  

## 2012-04-21 NOTE — Assessment & Plan Note (Signed)
Controlled, no change in medication Patient advised to reduce carb and sweets, commit to regular physical activity, take meds as prescribed, test blood as directed, and attempt to lose weight, to improve blood sugar control.  

## 2012-04-21 NOTE — Assessment & Plan Note (Signed)
Uncontrolled with increased symptoms, pt to be re evaluated by GI

## 2012-04-21 NOTE — Assessment & Plan Note (Signed)
Unchanged. Patient re-educated about  the importance of commitment to a  minimum of 150 minutes of exercise per week. The importance of healthy food choices with portion control discussed. Encouraged to start a food diary, count calories and to consider  joining a support group. Sample diet sheets offered. Goals set by the patient for the next several months.    

## 2012-04-22 ENCOUNTER — Encounter: Payer: Self-pay | Admitting: Family Medicine

## 2012-04-22 ENCOUNTER — Telehealth: Payer: Self-pay | Admitting: Urgent Care

## 2012-04-22 NOTE — Telephone Encounter (Signed)
Please call to remind pt to have labs drawn ordered at last OV. Thanks

## 2012-04-22 NOTE — Telephone Encounter (Signed)
LMOM to get labs done.

## 2012-04-23 ENCOUNTER — Telehealth: Payer: Self-pay | Admitting: Family Medicine

## 2012-04-23 ENCOUNTER — Other Ambulatory Visit: Payer: Self-pay | Admitting: Family Medicine

## 2012-04-23 DIAGNOSIS — R319 Hematuria, unspecified: Secondary | ICD-10-CM

## 2012-04-23 LAB — CBC WITH DIFFERENTIAL/PLATELET
Basophils Absolute: 0 10*3/uL (ref 0.0–0.1)
Basophils Relative: 0 % (ref 0–1)
Eosinophils Absolute: 0.1 10*3/uL (ref 0.0–0.7)
Eosinophils Relative: 2 % (ref 0–5)
HCT: 37 % (ref 36.0–46.0)
Lymphocytes Relative: 53 % — ABNORMAL HIGH (ref 12–46)
MCH: 26.2 pg (ref 26.0–34.0)
MCHC: 33.2 g/dL (ref 30.0–36.0)
MCV: 78.7 fL (ref 78.0–100.0)
Monocytes Absolute: 0.4 10*3/uL (ref 0.1–1.0)
RDW: 15.5 % (ref 11.5–15.5)

## 2012-04-23 LAB — TSH: TSH: 2.172 u[IU]/mL (ref 0.350–4.500)

## 2012-04-23 LAB — HEPATIC FUNCTION PANEL
ALT: 16 U/L (ref 0–35)
AST: 19 U/L (ref 0–37)
Bilirubin, Direct: 0.1 mg/dL (ref 0.0–0.3)

## 2012-04-23 NOTE — Telephone Encounter (Signed)
Pls refer pt to urology she has seen before eval hematuria, I am entering the referral

## 2012-04-26 ENCOUNTER — Other Ambulatory Visit: Payer: Self-pay

## 2012-04-26 DIAGNOSIS — M549 Dorsalgia, unspecified: Secondary | ICD-10-CM

## 2012-04-26 MED ORDER — OXYCODONE HCL 40 MG PO TB12: 40.0000 mg | ORAL_TABLET | Freq: Two times a day (BID) | ORAL | Status: DC | PRN

## 2012-04-26 NOTE — Progress Notes (Signed)
Quick Note:  Please let pt know her labs are stable. (blood ct, liver, pancreas & thyroid) We will call once stool is returned. Thanks WU:[id number] Lodema Hong, MD  ______

## 2012-04-29 ENCOUNTER — Telehealth: Payer: Self-pay | Admitting: Family Medicine

## 2012-04-29 NOTE — Progress Notes (Signed)
Quick Note:  Pt returned call and was informed. Will try to do the stool test today. ______

## 2012-04-29 NOTE — Progress Notes (Signed)
Quick Note:  LMOM to call. ______ 

## 2012-04-29 NOTE — Progress Notes (Signed)
Quick Note:  Pt returned call and was informed. Said she will try to do the stool test today. ______

## 2012-04-29 NOTE — Telephone Encounter (Signed)
Done and complete.

## 2012-05-02 ENCOUNTER — Other Ambulatory Visit: Payer: Self-pay | Admitting: Family Medicine

## 2012-05-02 NOTE — Telephone Encounter (Signed)
Patient is aware 

## 2012-05-03 ENCOUNTER — Other Ambulatory Visit: Payer: Self-pay | Admitting: Family Medicine

## 2012-05-13 ENCOUNTER — Telehealth: Payer: Self-pay | Admitting: Family Medicine

## 2012-05-15 NOTE — Telephone Encounter (Signed)
Called pt and left message to call back if still needs anything

## 2012-05-20 ENCOUNTER — Other Ambulatory Visit: Payer: Self-pay | Admitting: Family Medicine

## 2012-05-21 NOTE — Telephone Encounter (Signed)
Do you want to refill this ? 

## 2012-05-22 ENCOUNTER — Telehealth: Payer: Self-pay | Admitting: Family Medicine

## 2012-05-22 MED ORDER — CHLORPHENIRAMINE-HYDROCODONE 8-10 MG/5ML PO LQCR: 5.0000 mL | Freq: Two times a day (BID) | ORAL | Status: DC | PRN

## 2012-05-22 NOTE — Telephone Encounter (Signed)
Ok to refill 

## 2012-05-22 NOTE — Telephone Encounter (Signed)
Med sent in.

## 2012-05-22 NOTE — Telephone Encounter (Signed)
Refill x 2 please 

## 2012-05-24 ENCOUNTER — Other Ambulatory Visit: Payer: Self-pay

## 2012-05-24 DIAGNOSIS — M549 Dorsalgia, unspecified: Secondary | ICD-10-CM

## 2012-05-24 MED ORDER — OXYCODONE HCL 40 MG PO TB12: 40.0000 mg | ORAL_TABLET | Freq: Two times a day (BID) | ORAL | Status: DC | PRN

## 2012-06-14 ENCOUNTER — Other Ambulatory Visit: Payer: Self-pay | Admitting: Family Medicine

## 2012-06-15 ENCOUNTER — Other Ambulatory Visit: Payer: Self-pay

## 2012-06-26 ENCOUNTER — Other Ambulatory Visit: Payer: Self-pay | Admitting: Family Medicine

## 2012-06-28 ENCOUNTER — Other Ambulatory Visit: Payer: Self-pay

## 2012-06-28 DIAGNOSIS — M549 Dorsalgia, unspecified: Secondary | ICD-10-CM

## 2012-06-28 MED ORDER — OXYCODONE HCL 40 MG PO TB12: 40.0000 mg | ORAL_TABLET | Freq: Two times a day (BID) | ORAL | Status: DC | PRN

## 2012-07-01 ENCOUNTER — Other Ambulatory Visit: Payer: Self-pay | Admitting: Family Medicine

## 2012-07-03 ENCOUNTER — Other Ambulatory Visit: Payer: Self-pay | Admitting: Family Medicine

## 2012-07-23 ENCOUNTER — Encounter (HOSPITAL_COMMUNITY): Payer: Self-pay | Admitting: *Deleted

## 2012-07-23 ENCOUNTER — Emergency Department (HOSPITAL_COMMUNITY): Payer: PRIVATE HEALTH INSURANCE

## 2012-07-23 ENCOUNTER — Emergency Department (HOSPITAL_COMMUNITY)
Admission: EM | Admit: 2012-07-23 | Discharge: 2012-07-23 | Disposition: A | Discharge status: Home / Self Care | Payer: PRIVATE HEALTH INSURANCE | Attending: Emergency Medicine | Admitting: Emergency Medicine

## 2012-07-23 DIAGNOSIS — F3289 Other specified depressive episodes: Secondary | ICD-10-CM | POA: Insufficient documentation

## 2012-07-23 DIAGNOSIS — N132 Hydronephrosis with renal and ureteral calculous obstruction: Secondary | ICD-10-CM

## 2012-07-23 DIAGNOSIS — Z8619 Personal history of other infectious and parasitic diseases: Secondary | ICD-10-CM | POA: Insufficient documentation

## 2012-07-23 DIAGNOSIS — F411 Generalized anxiety disorder: Secondary | ICD-10-CM | POA: Insufficient documentation

## 2012-07-23 DIAGNOSIS — Z79899 Other long term (current) drug therapy: Secondary | ICD-10-CM | POA: Insufficient documentation

## 2012-07-23 DIAGNOSIS — N201 Calculus of ureter: Secondary | ICD-10-CM | POA: Insufficient documentation

## 2012-07-23 DIAGNOSIS — Z862 Personal history of diseases of the blood and blood-forming organs and certain disorders involving the immune mechanism: Secondary | ICD-10-CM | POA: Insufficient documentation

## 2012-07-23 DIAGNOSIS — F329 Major depressive disorder, single episode, unspecified: Secondary | ICD-10-CM | POA: Insufficient documentation

## 2012-07-23 DIAGNOSIS — Z8669 Personal history of other diseases of the nervous system and sense organs: Secondary | ICD-10-CM | POA: Insufficient documentation

## 2012-07-23 DIAGNOSIS — Z8719 Personal history of other diseases of the digestive system: Secondary | ICD-10-CM | POA: Insufficient documentation

## 2012-07-23 DIAGNOSIS — N23 Unspecified renal colic: Secondary | ICD-10-CM | POA: Insufficient documentation

## 2012-07-23 DIAGNOSIS — N189 Chronic kidney disease, unspecified: Secondary | ICD-10-CM | POA: Insufficient documentation

## 2012-07-23 DIAGNOSIS — I129 Hypertensive chronic kidney disease with stage 1 through stage 4 chronic kidney disease, or unspecified chronic kidney disease: Secondary | ICD-10-CM | POA: Insufficient documentation

## 2012-07-23 DIAGNOSIS — K219 Gastro-esophageal reflux disease without esophagitis: Secondary | ICD-10-CM | POA: Insufficient documentation

## 2012-07-23 DIAGNOSIS — G4730 Sleep apnea, unspecified: Secondary | ICD-10-CM | POA: Insufficient documentation

## 2012-07-23 DIAGNOSIS — Z8639 Personal history of other endocrine, nutritional and metabolic disease: Secondary | ICD-10-CM | POA: Insufficient documentation

## 2012-07-23 DIAGNOSIS — E7800 Pure hypercholesterolemia, unspecified: Secondary | ICD-10-CM | POA: Insufficient documentation

## 2012-07-23 DIAGNOSIS — I252 Old myocardial infarction: Secondary | ICD-10-CM | POA: Insufficient documentation

## 2012-07-23 DIAGNOSIS — Z923 Personal history of irradiation: Secondary | ICD-10-CM | POA: Insufficient documentation

## 2012-07-23 DIAGNOSIS — IMO0002 Reserved for concepts with insufficient information to code with codable children: Secondary | ICD-10-CM | POA: Insufficient documentation

## 2012-07-23 DIAGNOSIS — N1330 Unspecified hydronephrosis: Secondary | ICD-10-CM | POA: Insufficient documentation

## 2012-07-23 DIAGNOSIS — Z8739 Personal history of other diseases of the musculoskeletal system and connective tissue: Secondary | ICD-10-CM | POA: Insufficient documentation

## 2012-07-23 DIAGNOSIS — J45909 Unspecified asthma, uncomplicated: Secondary | ICD-10-CM | POA: Insufficient documentation

## 2012-07-23 DIAGNOSIS — E119 Type 2 diabetes mellitus without complications: Secondary | ICD-10-CM | POA: Insufficient documentation

## 2012-07-23 DIAGNOSIS — E669 Obesity, unspecified: Secondary | ICD-10-CM | POA: Insufficient documentation

## 2012-07-23 LAB — URINALYSIS, ROUTINE W REFLEX MICROSCOPIC
Bilirubin Urine: NEGATIVE
Ketones, ur: NEGATIVE mg/dL
Protein, ur: NEGATIVE mg/dL
Urobilinogen, UA: 0.2 mg/dL (ref 0.0–1.0)

## 2012-07-23 MED ORDER — SODIUM CHLORIDE 0.9 % IV SOLN
INTRAVENOUS | Status: DC
  Administered 2012-07-23: 14:00:00 via INTRAVENOUS

## 2012-07-23 MED ORDER — HYDROMORPHONE HCL PF 1 MG/ML IJ SOLN
1.0000 mg | Freq: Once | INTRAMUSCULAR | Status: AC
  Administered 2012-07-23: 1 mg via INTRAVENOUS
  Filled 2012-07-23: qty 1

## 2012-07-23 MED ORDER — OXYCODONE-ACETAMINOPHEN 5-325 MG PO TABS: 1.0000 | ORAL_TABLET | ORAL | Status: DC | PRN

## 2012-07-23 MED ORDER — ONDANSETRON HCL 4 MG/2ML IJ SOLN
4.0000 mg | Freq: Once | INTRAMUSCULAR | Status: AC
  Administered 2012-07-23: 4 mg via INTRAVENOUS
  Filled 2012-07-23: qty 2

## 2012-07-23 MED ORDER — FENTANYL CITRATE 0.05 MG/ML IJ SOLN
50.0000 ug | Freq: Once | INTRAMUSCULAR | Status: AC
  Administered 2012-07-23: 50 ug via INTRAVENOUS
  Filled 2012-07-23: qty 2

## 2012-07-23 NOTE — ED Notes (Signed)
Pain LUQ x 3 days,  Feels may be a kidney stone.  No NVD.  No fever.

## 2012-07-23 NOTE — ED Provider Notes (Signed)
History  This chart was scribed for Flint Melter, MD by Bennett Scrape, ED Scribe. This patient was seen in room APA03/APA03 and the patient's care was started at 1:27 PM.  CSN: [id number]  Arrival date & time 07/23/12  1155   First MD Initiated Contact with Patient 07/23/12 1327      Chief Complaint  Patient presents with  . Abdominal Pain     The history is provided by the patient. No language interpreter was used.    Angela [name] is a 52 y.o. female who presents to the Emergency Department complaining of 3 days of gradual onset, gradually worsening, constant LUQ abdominal pain. The pain is non-radiating and the pt refused to describe the pain stating that "it's pain". She rates her pain a 10 out of 10 currently. She reports taking neurotin, OxyContin and Flexeril prescribed by her PCP for her h/o chronic back pain with no improvement. She reports prior episodes diagnosed as kidney stones. She has a h/o chronic back pain but denies that this pain is attributable to her back problems. She also c/o a "bump" on the left foot but denies any recent falls or trauma. She denies cough, nausea, emesis, HA, dizziness and urinary symptoms as associated symptoms. She has a h/o DM, GERD and HTN. She denies smoking and alcohol use.  PCP is Dr. Lodema Hong.  Past Medical History  Diagnosis Date  . Low back pain   . DM type 2 (diabetes mellitus, type 2)   . Helicobacter pylori gastritis 2008  . Severe obesity (BMI >= 40) NOV 2008 270 LBS  . Hypokalemia   . Hyperthyroidism     s/p radiation  . GERD (gastroesophageal reflux disease)   . H/O hiatal hernia   . Headache     migraines  . Arthritis   . Anxiety   . Depression   . Sleep apnea     STOP BANG SCORE 6  . Asthma     chest  x ray  12/12 Epic  . Hypertension     cath  8/21 2008 EPIC, eccho 06/14/10 EPIC- EF 60-65%  . Hypercholesterolemia   . Neuromuscular disorder     DDD   with spasms/  diabetic neuropathy  . Myocardial infarction  1994     no  pain since 2008/ stress test  10/15/2006  . Dysrhythmia     occ flutter per patient history  . Chronic kidney disease     stones    Past Surgical History  Procedure Laterality Date  . Cholecystectomy  1996  . Left knee arthroscopic surgery  1999  . Left salphingectomy secondary to ectopic pregnancy  1991  . Partial hysterectomy  1991  . Knee arthroscopy  10/04/2010,     right knee arthroscopy, dr Thomasena Edis  . Upper gastrointestinal endoscopy  2008 abd pain    H. pylori gastritis  . Upper gastrointestinal endoscopy  1996 [facility], [place]    PUD  . Colonoscopy  2000 [facility]    INT HEMORRHOIDS/FISSURE  . Colonoscopy  2003 [facility], CHANGE IN BOWEL HABITS    INT HEMORRHOIDS  . Colonoscopy  2006 [facility]    INT HEMORRHOIDS  . Colonoscopy  2007 [facility]    INT HEMORRHOIDS  . Esophagogastroduodenoscopy  2008    WUJ:WJXBJ hiatal hernia./Normal esophagus without evidence of Barrett's mass, stricture, erosion or ulceration./Normal duodenal bulb and second portion of the duodenum./Diffuse erythema in the body and the antrum with occasional erosion.  Biopsies obtained via cold forceps to  evaluate for H. pylori gastritis  . Back surgery  1993  . Back surgery  1994  . Back surgery  2002  . Back surgery  2011    [facility]   . Cystoscopy/retrograde/ureteroscopy  12/04/2011    Procedure: CYSTOSCOPY/RETROGRADE/URETEROSCOPY;  Surgeon: Milford Cage, MD;  Location: [facility];  Service: Urology;  Laterality: Right;  . Fracture surgery      right clavicle  . Cardiac catheterization  2008  . Abdominal hysterectomy      Family History  Problem Relation Age of Onset  . Heart disease Mother   . Hypertension Mother   . Cancer Mother     cervical   . Heart attack Father   . Cancer Father     prostate  . Colon polyps Neg Hx   . Colon cancer Neg Hx     History  Substance Use Topics  . Smoking status: Never Smoker   . Smokeless tobacco: Never Used  . Alcohol Use: No    No OB history  provided.  Review of Systems  Constitutional: Negative for fever.  Respiratory: Negative for cough.   Gastrointestinal: Positive for abdominal pain. Negative for nausea, vomiting and diarrhea.  Genitourinary: Negative for dysuria, urgency and frequency.  Musculoskeletal: Positive for back pain (chronic) and arthralgias (left foot pain).  Neurological: Negative for dizziness.  All other systems reviewed and are negative.    Allergies  Morphine and Aspirin  Home Medications   Current Outpatient Rx  Name  Route  Sig  Dispense  Refill  . albuterol (PROVENTIL HFA;VENTOLIN HFA) 108 (90 BASE) MCG/ACT inhaler   Inhalation   Inhale 2 puffs into the lungs every 6 (six) hours as needed. For shortness of breath         . amLODipine (NORVASC) 5 MG tablet   Oral   Take 1 tablet (5 mg total) by mouth daily.   30 tablet   11   . atorvastatin (LIPITOR) 20 MG tablet   Oral   Take 20 mg by mouth daily.         . budesonide-formoterol (SYMBICORT) 160-4.5 MCG/ACT inhaler   Inhalation   Inhale 2 puffs into the lungs 2 (two) times daily as needed (Shortness of Breath).         . diazepam (VALIUM) 5 MG tablet   Oral   Take 5 mg by mouth 3 (three) times daily.         . ergocalciferol (VITAMIN D2) 50000 UNITS capsule   Oral   Take 50,000 Units by mouth once a week. Takes on Monday   12 capsule   1   . esomeprazole (NEXIUM) 40 MG capsule   Oral   Take 1 capsule (40 mg total) by mouth daily before breakfast.   30 capsule   0   . esomeprazole (NEXIUM) 40 MG capsule   Oral   Take 40 mg by mouth daily before breakfast.         . fluticasone (FLONASE) 50 MCG/ACT nasal spray   Nasal   Place 2 sprays into the nose as needed for rhinitis.          Marland Kitchen gabapentin (NEURONTIN) 800 MG tablet   Oral   Take 800 mg by mouth 4 (four) times daily.         Marland Kitchen ibuprofen (ADVIL,MOTRIN) 200 MG tablet   Oral   Take 400-800 mg by mouth as needed. For pain         . lisinopril  (  PRINIVIL,ZESTRIL) 5 MG tablet   Oral   Take 5 mg by mouth daily.         . metFORMIN (GLUCOPHAGE) 1000 MG tablet   Oral   Take 1,000 mg by mouth 2 (two) times daily with a meal.         . montelukast (SINGULAIR) 10 MG tablet   Oral   Take 10 mg by mouth daily.         Marland Kitchen oxyCODONE (OXYCONTIN) 40 MG 12 hr tablet   Oral   Take 40 mg by mouth 2 (two) times daily as needed for pain.         . potassium chloride SA (K-DUR,KLOR-CON) 20 MEQ tablet   Oral   Take 20 mEq by mouth daily with breakfast.          . zolpidem (AMBIEN) 10 MG tablet   Oral   Take 10 mg by mouth at bedtime as needed for sleep.          . nitroGLYCERIN (NITROLINGUAL) 0.4 MG/SPRAY spray   Sublingual   Place 1 spray under the tongue every 5 (five) minutes as needed. For chest pain         . oxyCODONE-acetaminophen (PERCOCET/ROXICET) 5-325 MG per tablet   Oral   Take 1 tablet by mouth every 4 (four) hours as needed for pain.   30 tablet   0     Triage Vitals: BP 141/81  Pulse 71  Temp(Src) 98.2 F (36.8 C) (Oral)  Resp 20  Ht 5\' 4"  (1.626 m)  Wt 279 lb (126.554 kg)  BMI 47.87 kg/m2  SpO2 100%  Physical Exam  Nursing note and vitals reviewed. Constitutional: She is oriented to person, place, and time. She appears well-developed and well-nourished.  HENT:  Head: Normocephalic and atraumatic.  Eyes: Conjunctivae and EOM are normal. Pupils are equal, round, and reactive to light.  Neck: Normal range of motion and phonation normal. Neck supple.  Cardiovascular: Normal rate, regular rhythm and intact distal pulses.   Pulmonary/Chest: Effort normal and breath sounds normal. She exhibits no tenderness.  Abdominal: Soft. She exhibits no distension. There is tenderness (moderate LUQ tenderness). There is no guarding.  Genitourinary:  No CVA tenderness  Musculoskeletal: Normal range of motion.  Neurological: She is alert and oriented to person, place, and time. She has normal strength. She  exhibits normal muscle tone.  Skin: Skin is warm and dry.  2 cm raised area over the left lateral midfoot   Psychiatric: She has a normal mood and affect. Her behavior is normal. Judgment and thought content normal.    ED Course  Procedures (including critical care time)  DIAGNOSTIC STUDIES: Oxygen Saturation is 100% on room air, normal by my interpretation.    COORDINATION OF CARE: 1:43 PM-Advised pt that her left foot pain is most likely due to a strain and informed pt that she should f/u with her PCP for the symptoms. Discussed treatment plan which includes CT of abdomen, UA and medications with pt at bedside and pt agreed to plan.   1:45 PM- Ordered 4 mg Zofran injection and 50 mcg injection of sublimaze.  3:17 PM-Informed pt of radiology results confirming kidney stone. Discussed discharge plan which includes pain medication with pt and pt agreed to plan. Also advised pt to follow up with urologist and pt agreed.  Medications  ondansetron (ZOFRAN) injection 4 mg (4 mg Intravenous Given 07/23/12 1340)  fentaNYL (SUBLIMAZE) injection 50 mcg (50 mcg Intravenous Given 07/23/12  1340)  HYDROmorphone (DILAUDID) injection 1 mg (1 mg Intravenous Given 07/23/12 1451)     Labs Reviewed  URINALYSIS, ROUTINE W REFLEX MICROSCOPIC - Abnormal; Notable for the following:    Hgb urine dipstick MODERATE (*)    All other components within normal limits  URINE MICROSCOPIC-ADD ON - Abnormal; Notable for the following:    Squamous Epithelial / LPF FEW (*)    All other components within normal limits   Ct Abdomen Pelvis Wo Contrast  07/23/2012  *RADIOLOGY REPORT*  Clinical Data: Acute left flank pain, history renal calculi  CT ABDOMEN AND PELVIS WITHOUT CONTRAST  Technique:  Multidetector CT imaging of the abdomen and pelvis was performed following the standard protocol without intravenous contrast.  Comparison: 11/23/2011  Findings: Minimal chronic lingula scarring.  Lung bases otherwise clear.  No  pleural or pericardial effusion.  No hiatal hernia.  Abdomen:  Scattered low density cystic areas throughout the liver, predominately in the left hepatic lobe without interval change suspect numerous hepatic cysts.  Prior cholecystectomy evident.  No biliary dilatation.  Biliary system, pancreas, spleen, and adrenal glands are within normal limits for noncontrast study.  Left kidney demonstrates acute perinephric edema and mild hydronephrosis secondary to a left UPJ obstructing calculus measuring 9 x 7 mm, image 37.  Additional punctate nonobstructing renal calculi in the left kidney lower pole, image 38.  Right kidney demonstrates no obstructing urinary tract calculus or acute process.  Negative for bowel obstruction, dilatation, ileus, or free air.  No abdominal free fluid, fluid collection, hemorrhage, abscess, or adenopathy.  Normal appendix demonstrated.  Postop changes of the lower lumbar spine.  Pelvis:  Unremarkable urinary bladder.  Stable pelvic calcifications consistent with venous phleboliths.  No pelvic free fluid, fluid collection, hemorrhage, adenopathy, abscess, inguinal abnormality, hernia.  Prior hysterectomy noted.  Stable prominence of the left ovary.  Postop changes at L4-5.  Vacuum disc phenomenon at L3-4.  Stable alignment hardware.  IMPRESSION: Acute mildly obstructing 9 X 7 mm left UPJ calculus with associated left hydronephrosis and left perinephric edema.  Additional left lower pole nonobstructing punctate renal calculi  No acute right urinary tract process.  Prior cholecystectomy and partial hysterectomy  Stable hepatic cysts   Original Report Authenticated By: Judie Petit. Shick, M.D.      1. Renal colic on left side   2. Ureteral stone with hydronephrosis       MDM  Large proximal left ureter stone, with hydro-nephrosis. She is improved with treatment in the emergency department. She will likely need instrumentation, and/or lithotripsy, to be able to pass the stone. Doubt metabolic  instability, serious bacterial infection or impending vascular collapse; the patient is stable for discharge.     Plan: Home Medications- percocet; Home Treatments- take percocet every 4 hours as needed; Recommended follow up- urologist 2-3 days  I personally performed the services described in this documentation, which was scribed in my presence. The recorded information has been reviewed and is accurate.         Flint Melter, MD 07/23/12 [phone]

## 2012-07-24 ENCOUNTER — Encounter (HOSPITAL_COMMUNITY): Payer: Self-pay | Admitting: *Deleted

## 2012-07-24 ENCOUNTER — Other Ambulatory Visit: Payer: Self-pay | Admitting: Urology

## 2012-07-25 ENCOUNTER — Other Ambulatory Visit: Payer: Self-pay | Admitting: Urology

## 2012-07-25 ENCOUNTER — Encounter (HOSPITAL_COMMUNITY)
Admission: EM | Disposition: A | Discharge status: Home / Self Care | Payer: Self-pay | Source: Home / Self Care | Attending: Emergency Medicine

## 2012-07-25 ENCOUNTER — Observation Stay (HOSPITAL_COMMUNITY)
Admission: EM | Admit: 2012-07-25 | Discharge: 2012-07-26 | Disposition: A | Discharge status: Home / Self Care | Payer: PRIVATE HEALTH INSURANCE | Attending: Urology | Admitting: Urology

## 2012-07-25 ENCOUNTER — Encounter (HOSPITAL_COMMUNITY): Payer: Self-pay

## 2012-07-25 ENCOUNTER — Encounter (HOSPITAL_COMMUNITY): Payer: Self-pay | Admitting: Registered Nurse

## 2012-07-25 ENCOUNTER — Emergency Department (HOSPITAL_COMMUNITY): Payer: PRIVATE HEALTH INSURANCE | Admitting: Registered Nurse

## 2012-07-25 ENCOUNTER — Emergency Department (HOSPITAL_COMMUNITY): Payer: PRIVATE HEALTH INSURANCE

## 2012-07-25 DIAGNOSIS — N1330 Unspecified hydronephrosis: Secondary | ICD-10-CM | POA: Insufficient documentation

## 2012-07-25 DIAGNOSIS — N23 Unspecified renal colic: Secondary | ICD-10-CM

## 2012-07-25 DIAGNOSIS — N201 Calculus of ureter: Principal | ICD-10-CM | POA: Insufficient documentation

## 2012-07-25 DIAGNOSIS — R109 Unspecified abdominal pain: Secondary | ICD-10-CM | POA: Insufficient documentation

## 2012-07-25 DIAGNOSIS — Z90710 Acquired absence of both cervix and uterus: Secondary | ICD-10-CM | POA: Insufficient documentation

## 2012-07-25 DIAGNOSIS — Z79899 Other long term (current) drug therapy: Secondary | ICD-10-CM | POA: Insufficient documentation

## 2012-07-25 DIAGNOSIS — E119 Type 2 diabetes mellitus without complications: Secondary | ICD-10-CM | POA: Insufficient documentation

## 2012-07-25 DIAGNOSIS — G4730 Sleep apnea, unspecified: Secondary | ICD-10-CM | POA: Insufficient documentation

## 2012-07-25 DIAGNOSIS — I252 Old myocardial infarction: Secondary | ICD-10-CM | POA: Insufficient documentation

## 2012-07-25 DIAGNOSIS — I10 Essential (primary) hypertension: Secondary | ICD-10-CM | POA: Insufficient documentation

## 2012-07-25 DIAGNOSIS — K219 Gastro-esophageal reflux disease without esophagitis: Secondary | ICD-10-CM | POA: Insufficient documentation

## 2012-07-25 HISTORY — PX: CYSTOSCOPY WITH STENT PLACEMENT: SHX5790

## 2012-07-25 LAB — CBC WITH DIFFERENTIAL/PLATELET
Eosinophils Relative: 1 % (ref 0–5)
HCT: 38.1 % (ref 36.0–46.0)
Hemoglobin: 12.7 g/dL (ref 12.0–15.0)
Lymphocytes Relative: 21 % (ref 12–46)
Lymphs Abs: 1.7 10*3/uL (ref 0.7–4.0)
MCV: 78.6 fL (ref 78.0–100.0)
Monocytes Absolute: 0.5 10*3/uL (ref 0.1–1.0)
Platelets: 277 10*3/uL (ref 150–400)
RBC: 4.85 MIL/uL (ref 3.87–5.11)
WBC: 8 10*3/uL (ref 4.0–10.5)

## 2012-07-25 LAB — POCT I-STAT, CHEM 8
BUN: 16 mg/dL (ref 6–23)
Creatinine, Ser: 1.2 mg/dL — ABNORMAL HIGH (ref 0.50–1.10)
Sodium: 138 mEq/L (ref 135–145)
TCO2: 26 mmol/L (ref 0–100)

## 2012-07-25 LAB — GLUCOSE, CAPILLARY
Glucose-Capillary: 118 mg/dL — ABNORMAL HIGH (ref 70–99)
Glucose-Capillary: 122 mg/dL — ABNORMAL HIGH (ref 70–99)

## 2012-07-25 SURGERY — CYSTOSCOPY, WITH STENT INSERTION
Anesthesia: General | Site: Pelvis | Laterality: Left | Wound class: Clean Contaminated

## 2012-07-25 MED ORDER — PROMETHAZINE HCL 25 MG/ML IJ SOLN: 6.2500 mg | INTRAMUSCULAR | Status: DC | PRN

## 2012-07-25 MED ORDER — HYDROMORPHONE HCL PF 1 MG/ML IJ SOLN
1.0000 mg | Freq: Once | INTRAMUSCULAR | Status: AC
  Administered 2012-07-25: 1 mg via INTRAVENOUS
  Filled 2012-07-25: qty 1

## 2012-07-25 MED ORDER — PROPOFOL 10 MG/ML IV BOLUS
INTRAVENOUS | Status: DC | PRN
  Administered 2012-07-25: 200 mg via INTRAVENOUS

## 2012-07-25 MED ORDER — LACTATED RINGERS IV SOLN
INTRAVENOUS | Status: DC
  Administered 2012-07-25 (×2): via INTRAVENOUS

## 2012-07-25 MED ORDER — ONDANSETRON HCL 4 MG/2ML IJ SOLN: 4.0000 mg | INTRAMUSCULAR | Status: DC | PRN

## 2012-07-25 MED ORDER — MEPERIDINE HCL 50 MG/ML IJ SOLN: 6.2500 mg | INTRAMUSCULAR | Status: DC | PRN

## 2012-07-25 MED ORDER — ONDANSETRON HCL 4 MG/2ML IJ SOLN
INTRAMUSCULAR | Status: AC
  Filled 2012-07-25: qty 2

## 2012-07-25 MED ORDER — PHENAZOPYRIDINE HCL 200 MG PO TABS: 200.0000 mg | ORAL_TABLET | Freq: Three times a day (TID) | ORAL | Status: DC | PRN

## 2012-07-25 MED ORDER — HYDROMORPHONE HCL PF 1 MG/ML IJ SOLN: 0.2500 mg | INTRAMUSCULAR | Status: DC | PRN

## 2012-07-25 MED ORDER — HYDROMORPHONE HCL PF 1 MG/ML IJ SOLN
1.0000 mg | Freq: Once | INTRAMUSCULAR | Status: AC
  Administered 2012-07-25: 1 mg via INTRAVENOUS

## 2012-07-25 MED ORDER — LIDOCAINE HCL (CARDIAC) 10 MG/ML IV SOLN
INTRAVENOUS | Status: DC | PRN
  Administered 2012-07-25: 50 mg via INTRAVENOUS

## 2012-07-25 MED ORDER — INDIGOTINDISULFONATE SODIUM 8 MG/ML IJ SOLN
INTRAMUSCULAR | Status: AC
  Filled 2012-07-25: qty 5

## 2012-07-25 MED ORDER — PROMETHAZINE HCL 25 MG/ML IJ SOLN
INTRAMUSCULAR | Status: AC
  Filled 2012-07-25: qty 1

## 2012-07-25 MED ORDER — FENTANYL CITRATE 0.05 MG/ML IJ SOLN
INTRAMUSCULAR | Status: DC | PRN
  Administered 2012-07-25: 100 ug via INTRAVENOUS

## 2012-07-25 MED ORDER — STERILE WATER FOR IRRIGATION IR SOLN
Status: DC | PRN
  Administered 2012-07-25: 3000 mL

## 2012-07-25 MED ORDER — LIDOCAINE HCL 4 % MT SOLN
OROMUCOSAL | Status: DC | PRN
  Administered 2012-07-25: 4 mL via TOPICAL

## 2012-07-25 MED ORDER — ONDANSETRON HCL 4 MG/2ML IJ SOLN
4.0000 mg | Freq: Once | INTRAMUSCULAR | Status: AC
  Administered 2012-07-25: 4 mg via INTRAVENOUS

## 2012-07-25 MED ORDER — ACETAMINOPHEN 325 MG PO TABS: 650.0000 mg | ORAL_TABLET | ORAL | Status: DC | PRN

## 2012-07-25 MED ORDER — IOHEXOL 300 MG/ML  SOLN
INTRAMUSCULAR | Status: AC
  Filled 2012-07-25: qty 1

## 2012-07-25 MED ORDER — SUCCINYLCHOLINE CHLORIDE 20 MG/ML IJ SOLN
INTRAMUSCULAR | Status: DC | PRN
  Administered 2012-07-25: 100 mg via INTRAVENOUS

## 2012-07-25 MED ORDER — PROMETHAZINE HCL 25 MG/ML IJ SOLN
INTRAMUSCULAR | Status: DC | PRN
  Administered 2012-07-25: 12.5 mg via INTRAVENOUS

## 2012-07-25 MED ORDER — CIPROFLOXACIN IN D5W 400 MG/200ML IV SOLN
INTRAVENOUS | Status: AC
  Filled 2012-07-25: qty 200

## 2012-07-25 MED ORDER — IOHEXOL 300 MG/ML  SOLN
INTRAMUSCULAR | Status: DC | PRN
  Administered 2012-07-25: 50 mL via INTRAVENOUS

## 2012-07-25 MED ORDER — ZOLPIDEM TARTRATE 5 MG PO TABS: 5.0000 mg | ORAL_TABLET | Freq: Every evening | ORAL | Status: DC | PRN

## 2012-07-25 MED ORDER — ONDANSETRON HCL 4 MG/2ML IJ SOLN
4.0000 mg | Freq: Once | INTRAMUSCULAR | Status: AC
  Administered 2012-07-25: 4 mg via INTRAVENOUS
  Filled 2012-07-25: qty 2

## 2012-07-25 MED ORDER — CIPROFLOXACIN IN D5W 200 MG/100ML IV SOLN
200.0000 mg | INTRAVENOUS | Status: AC
  Administered 2012-07-25: 200 mg via INTRAVENOUS
  Filled 2012-07-25: qty 100

## 2012-07-25 MED ORDER — LIDOCAINE HCL 2 % EX GEL
CUTANEOUS | Status: AC
  Filled 2012-07-25: qty 10

## 2012-07-25 MED ORDER — TRAMADOL HCL 50 MG PO TABS
50.0000 mg | ORAL_TABLET | Freq: Four times a day (QID) | ORAL | Status: DC
  Administered 2012-07-25 – 2012-07-26 (×3): 50 mg via ORAL
  Filled 2012-07-25 (×8): qty 1

## 2012-07-25 MED ORDER — ACETAMINOPHEN 10 MG/ML IV SOLN: 1000.0000 mg | Freq: Once | INTRAVENOUS | Status: DC | PRN

## 2012-07-25 MED ORDER — HYOSCYAMINE SULFATE 0.125 MG SL SUBL
0.1250 mg | SUBLINGUAL_TABLET | SUBLINGUAL | Status: DC | PRN
  Filled 2012-07-25: qty 1

## 2012-07-25 MED ORDER — HYDROMORPHONE HCL PF 1 MG/ML IJ SOLN
INTRAMUSCULAR | Status: AC
  Filled 2012-07-25: qty 1

## 2012-07-25 SURGICAL SUPPLY — 14 items
ADAPTER CATH URET PLST 4-6FR (CATHETERS) ×2 IMPLANT
ADPR CATH URET STRL DISP 4-6FR (CATHETERS) ×1
BAG URO CATCHER STRL LF (DRAPE) ×2 IMPLANT
CATH INTERMIT  6FR 70CM (CATHETERS) ×2 IMPLANT
CLOTH BEACON ORANGE TIMEOUT ST (SAFETY) ×2 IMPLANT
DRAPE CAMERA CLOSED 9X96 (DRAPES) ×2 IMPLANT
GLOVE BIOGEL M 8.0 STRL (GLOVE) ×2 IMPLANT
GOWN PREVENTION PLUS XLARGE (GOWN DISPOSABLE) ×2 IMPLANT
GOWN STRL REIN XL XLG (GOWN DISPOSABLE) ×2 IMPLANT
GUIDEWIRE STR DUAL SENSOR (WIRE) ×2 IMPLANT
MANIFOLD NEPTUNE II (INSTRUMENTS) ×2 IMPLANT
PACK CYSTO (CUSTOM PROCEDURE TRAY) ×2 IMPLANT
STENT CONTOUR 6FRX24X.038 (STENTS) ×1 IMPLANT
TUBING CONNECTING 10 (TUBING) ×2 IMPLANT

## 2012-07-25 NOTE — ED Notes (Signed)
Pt has known left kidney stone to left side, pain became severe last night and her pain meds are not helping (percocet last dose 4am), is scheduled for surgery on Monday in [place].

## 2012-07-25 NOTE — ED Notes (Signed)
Pt resting quietly with eyes closed, states she is feeling much better now.

## 2012-07-25 NOTE — Transfer of Care (Signed)
Immediate Anesthesia Transfer of Care Note  Patient: Angela [name]  Procedure(s) Performed: Procedure(s): CYSTOSCOPY, left retograde pyelogram WITH left ureteral  STENT PLACEMENT (Left)  Patient Location: PACU  Anesthesia Type:General  Level of Consciousness: awake, oriented, sedated, patient cooperative and responds to stimulation  Airway & Oxygen Therapy: Patient Spontanous Breathing and Patient connected to face mask oxygen  Post-op Assessment: Report given to PACU RN, Post -op Vital signs reviewed and stable and Patient moving all extremities X 4  Post vital signs: stable  Complications: No apparent anesthesia complications

## 2012-07-25 NOTE — Progress Notes (Signed)
PACU Progress note: Primary surgeon called re: patient not having a responsible adult with her, also that per pt history, medications received and pt sedation status, request pt be admitted for observation. MD provided no further orders, Anesthesiologist on duty notified of situation for further guidance.

## 2012-07-25 NOTE — H&P (Signed)
History of Present Illness     52 year old female presents today for ureter stone. This is located on the left side. There is a proximal stone. This is associated with left flank pain. This is not associated with fever. She has had nausea. This is not associated with gross hematuria.  She was seen in the ER yesterday, 07/23/12. She had a CT scan of the abdomen and pelvis without IV contrast. This revealed 2 small stones in the left lower pole kidney which measured 0.3 and 0.4 cm. She had an obstructing left proximal ureter stone. This was 1.1 cm. It was associated with left hydronephrosis. UA from the ER yesterday was negative for leukocyte esterase/nitrites, zero-2 WBCs, 3-6 RBCs. No urine culture was obtained.  KUB today revealed the stone is visible in the left proximal ureter. Results section for full interpretation.  She presented any pain emergency room with severe left flank pain. They were unable to get her pain controlled. I was contacted regarding the patient's intractable pain and agreed to accept the patient in transfer for surgical intervention with stent placement.   Past Medical History Problems  1. History of  Acute Myocardial Infarction V12.59 2. History of  Arthritis V13.4 3. History of  Asthma 493.90 4. History of  Diabetes Mellitus 250.00 5. History of  Esophageal Reflux 530.81 6. History of  Hemorrhoids 455.6 7. History of  Hypertension 401.9 8. History of  Irritable Bowel Syndrome 564.1 9. History of  Nephrolithiasis V13.01  Surgical History Problems  1. History of  Arthroscopy Knee Left 2. History of  Cholecystectomy 3. History of  Cystoscopy With Insertion Of Temporary Urethral Stent 4. History of  Cystoscopy With Ureteroscopy Right 5. History of  Cystoscopy With Ureteroscopy Right 6. History of  Hysterectomy V45.77 7. History of  Lithotripsy 8. History of  Lower Back Surgery 9. History of  Oophorectomy For Ectopic Pregnancy 10. History of  Shoulder  Surgery  Current Meds 1. AmLODIPine Besylate 5 MG Oral Tablet; Therapy: (Recorded:29Jul2013) to 2. Atorvastatin Calcium 20 MG Oral Tablet; Therapy: 09May2013 to 3. CeleBREX 200 MG Oral Capsule; Therapy: (Recorded:07Jan2009) to 4. Cyclobenzaprine HCl 10 MG Oral Tablet; Therapy: (Recorded:07Jan2009) to 5. Diazepam 5 MG Oral Tablet; Therapy: (Recorded:07Jan2009) to 6. Diovan HCT 80-12.5 MG Oral Tablet; Therapy: (Recorded:07Jan2009) to 7. Fluconazole 200 MG Oral Tablet; Therapy: 26Jul2013 to 8. FLUoxetine HCl 20 MG Oral Capsule; Therapy: 18Feb2013 to 9. Fluticasone Propionate 50 MCG/ACT Nasal Suspension; Therapy: 18Feb2013 to 10. Gabapentin 800 MG Oral Tablet; Therapy: (Recorded:07Jan2009) to 11. Goody Headache PACK; Therapy: (Recorded:29Jul2013) to 12. Ibuprofen 200 MG Oral Tablet; Therapy: (Recorded:29Jul2013) to 13. Ipratropium-Albuterol 0.5-2.5 (3) MG/3ML Inhalation Solution; Therapy: (Recorded:29Jul2013) to 14. MetFORMIN HCl 500 MG Oral Tablet; Therapy: (Recorded:07Jan2009) to 15. Montelukast Sodium 10 MG Oral Tablet; Therapy: 18Feb2013 to 16. NitroQuick 0.4 MG SUBL; Therapy: (Recorded:07Jan2009) to 17. Omeprazole 20 MG Oral Capsule Delayed Release; Therapy: (Recorded:07Jan2009) to 18. Oxycodone-Acetaminophen 5-325 MG Oral Tablet; TAKE 1 TO 2 TABLETS EVERY 4 TO 6 HOURS   AS NEEDED FOR PAIN; Therapy: 29Jul2013 to (Evaluate:04Aug2013); Last Rx:29Jul2013 19. Oxycodone-Acetaminophen 5-325 MG Oral Tablet; Therapy: 26Jul2013 to 20. Pantoprazole Sodium 40 MG Oral Tablet Delayed Release; Therapy: 19Mar2013 to 21. Percocet TABS; Therapy: (Recorded:29Jul2013) to 22. Potassium Chloride Crys ER 20 MEQ Oral Tablet Extended Release; Therapy: 18Feb2013 to 23. ProAir HFA AERS; Therapy: (Recorded:29Jul2013) to 24. Promethazine HCl 25 MG Oral Tablet; Therapy: (Recorded:29Jul2013) to 25. Symbicort 160-4.5 MCG/ACT Inhalation Aerosol; Therapy: 18Feb2013 to 26. Tamsulosin HCl 0.4 MG Oral Capsule; TAKE 1  CAPSULE EVERY DAY; Therapy: 29Jul2013 to   (Evaluate:28Aug2013)  Requested for: 29Jul2013; Last Rx:29Jul2013 27. Tamsulosin HCl 0.4 MG Oral Capsule; Therapy: 26Jul2013 to 28. Vitamin D TABS; Therapy: (Recorded:29Jul2013) to 29. Zolpidem Tartrate 10 MG Oral Tablet; Therapy: (Recorded:29Jul2013) to  Allergies Medication  1. Morphine Derivatives 2. Aspirin TABS  Family History Problems  1. Paternal history of  Brain Cancer V16.8 2. Paternal history of  Death In The Family Father Diabetes and complications of brain cancerAge 61 3. Family history of  Death In The Family Mother passed at age 34 from heart problems/cancer 4. Family history of  Family Health Status Number Of Children One daughter  Social History Problems  1. Caffeine Use 1 per day 2. Marital History - Divorced V61.0 3. Never A Smoker 4. Physical Disability: Denied  5. Alcohol Use  Review of Systems Cardiovascular and pulmonary system(s) were reviewed and pertinent findings if present are noted.  Respiratory: cough.    Vitals Vital Signs BMI Calculated: 46.95 BSA Calculated: 2.24 Weight: 275 lb  Blood Pressure: 133 / 87 Temperature: 97.7 F Heart Rate: 69  Physical Exam Constitutional: Well nourished and well developed.  ENT:. The ears and nose are normal in appearance. The oropharynx is normal.  Neck: The appearance of the neck is normal . There is no jugular-venous distention.  Pulmonary: No respiratory distress and normal respiratory rhythm and effort.  Cardiovascular:. The arterial pulses are normal. No peripheral edema. Abdomen: The abdomen is obese, but not distended. The abdomen is soft and nontender. The abdomen is no rebound. No CVA tenderness. No hepatosplenomegaly noted. not tender  Lymphatics: The posterior cervical and supraclavicular nodes are not enlarged or tender.  Skin: Normal skin turgor and no visible rash.  Neuro/Psych:. Mood and affect are appropriate. No focal sensory deficits.     Results/Data  Urine  COLOR YELLOW  APPEARANCE CLOUDY  SPECIFIC GRAVITY 1.020  pH 6.0  GLUCOSE NEG mg/dL BILIRUBIN NEG  KETONE NEG mg/dL BLOOD LARGE  PROTEIN NEG mg/dL UROBILINOGEN 0.2 mg/dL NITRITE NEG  LEUKOCYTE ESTERASE NEG  SQUAMOUS EPITHELIAL/HPF FEW  WBC 0-2 WBC/hpf RBC 7-10 RBC/hpf BACTERIA FEW  CRYSTALS NONE SEEN  CASTS NONE SEEN   The following images/tracing/specimen were independently visualized:  KUB today was obtained and performed and compared to CT scan from yesterday, 07/23/12. This is a single shot AP view. There is a normal bowel gas pattern. There are no blastic lesions of the bones. Spine stabilization equipment is noted in the lumbar spine which is stable. Her surgical clips in the right upper quadrant. Large calcification is seen in the left upper quadrant consistent with left proximal ureter stone. It is difficult to see the stones in the left lobe. There are no other calcifications overlying the course of the ureters bilaterally. There is stable right pelvic phlebolith.     Assessment Assessed  She has a moderately large stone in her left proximal ureter. Although she is scheduled for lithotripsy on Monday she is experiencing intractable pain. We therefore discussed proceeding with cystoscopy and stent placement at this time. I'm going over the procedure with her in detail including its risks and complications, the alternatives and the probability of success. She understands and has elected to proceed.  Plan  She will undergo cystoscopy with left retrograde pyelograms and stent placement.

## 2012-07-25 NOTE — ED Notes (Signed)
Pt states that her pain is returning, medication administered.

## 2012-07-25 NOTE — ED Notes (Addendum)
Report to J. Verner Mould, Charity fundraiser.  The patient states that she has acid reflux really bad and would like to take her Nexium.  Ok to take Nexium po per Dr. Effie Shy with sip of water only, otherwise NPO.

## 2012-07-25 NOTE — Anesthesia Preprocedure Evaluation (Signed)
Anesthesia Evaluation  Patient identified by MRN, date of birth, ID band Patient awake    Reviewed: Allergy & Precautions, H&P , NPO status , Patient's Chart, lab work & pertinent test results  Airway Mallampati: II TM Distance: >3 FB Neck ROM: Full    Dental no notable dental hx. (+) Dental Advisory Given and Teeth Intact   Pulmonary shortness of breath, asthma , sleep apnea ,  Increased STOPBANG but no diagnosis of OSA yet. breath sounds clear to auscultation  Pulmonary exam normal       Cardiovascular hypertension, Pt. on medications + Past MI + dysrhythmias Rhythm:Regular Rate:Normal     Neuro/Psych  Headaches, PSYCHIATRIC DISORDERS Anxiety Depression    GI/Hepatic Neg liver ROS, hiatal hernia, GERD-  ,  Endo/Other  diabetes, Type 2, Oral Hypoglycemic AgentsHyperthyroidism Morbid obesity  Renal/GU Renal InsufficiencyRenal diseasenegative Renal ROS  negative genitourinary   Musculoskeletal negative musculoskeletal ROS (+)   Abdominal (+) + obese,   Peds negative pediatric ROS (+)  Hematology negative hematology ROS (+)   Anesthesia Other Findings   Reproductive/Obstetrics negative OB ROS                           Anesthesia Physical  Anesthesia Plan  ASA: III  Anesthesia Plan: General   Post-op Pain Management:    Induction: Intravenous and Rapid sequence  Airway Management Planned: Oral ETT  Additional Equipment:   Intra-op Plan:   Post-operative Plan: Extubation in OR  Informed Consent: I have reviewed the patients History and Physical, chart, labs and discussed the procedure including the risks, benefits and alternatives for the proposed anesthesia with the patient or authorized representative who has indicated his/her understanding and acceptance.   Dental advisory given  Plan Discussed with: CRNA  Anesthesia Plan Comments:         Anesthesia Quick  Evaluation

## 2012-07-25 NOTE — ED Provider Notes (Signed)
History  This chart was scribed for Flint Melter, MD by Shari Heritage, ED Scribe. The patient was seen in room APA05/APA05. Patient's care was started at 1038.   CSN: [id number]  Arrival date & time 07/25/12  1016   First MD Initiated Contact with Patient 07/25/12 1038      Chief Complaint  Patient presents with  . Flank Pain     The history is limited by the condition of the patient. No language interpreter was used.    Level 5 Caveat - Full history could not be obtained due to patient's severe illness.  Angela [name] is a 52 y.o. female who presents to the Emergency Department complaining of severe, constant, LUQ abdominal pain onset last night. Per medical records, patient was seen here for the same problem on 07/23/2012 and was diagnosed with acute mildly obstructing 9 X 7 mm left UPJ calculus with associated left hydronephrosis and left perinephric edema on abdominal CT. Patient saw a urologist and is scheduled for lithotripsy on Monday in [place]. She has returned to the ED this morning because pain worsened significantly overnight.   Past Medical History  Diagnosis Date  . Low back pain   . DM type 2 (diabetes mellitus, type 2)   . Helicobacter pylori gastritis 2008  . Severe obesity (BMI >= 40) NOV 2008 270 LBS  . Hypokalemia   . Hyperthyroidism     s/p radiation  . GERD (gastroesophageal reflux disease)   . H/O hiatal hernia   . Anxiety   . Depression   . Asthma     chest  x ray  12/12 Epic  . Hypertension     cath  8/21 2008 EPIC, eccho 06/14/10 EPIC- EF 60-65%  . Hypercholesterolemia   . Neuromuscular disorder     DDD   with spasms/  diabetic neuropathy  . Myocardial infarction 1994     no  pain since 2008/ stress test  10/15/2006  . Dysrhythmia     occ flutter per patient history  . Sleep apnea     STOP BANG SCORE 6no cpap used  . Chronic kidney disease     stones  . Headache     migraines  . Arthritis     all over    Past Surgical History   Procedure Laterality Date  . Cholecystectomy  1996  . Left knee arthroscopic surgery  1999  . Left salphingectomy secondary to ectopic pregnancy  1991  . Partial hysterectomy  1991  . Knee arthroscopy  10/04/2010,     right knee arthroscopy, dr Thomasena Edis  . Upper gastrointestinal endoscopy  2008 abd pain    H. pylori gastritis  . [place] gastrointestinal endoscopy  [place] [facility]    PUD  . Colonoscopy  2000 [facility]    INT HEMORRHOIDS/FISSURE  . Colonoscopy  2003 [facility], CHANGE IN BOWEL HABITS    INT HEMORRHOIDS  . Colonoscopy  2006 [facility]    INT HEMORRHOIDS  . Colonoscopy  2007 [facility]    INT HEMORRHOIDS  . Esophagogastroduodenoscopy  2008    NWG:NFAOZ hiatal hernia./Normal esophagus without evidence of Barrett's mass, stricture, erosion or ulceration./Normal duodenal bulb and second portion of the duodenum./Diffuse erythema in the body and the antrum with occasional erosion.  Biopsies obtained via cold forceps to evaluate for H. pylori gastritis  . Back surgery  1993  . Back surgery  1994  . Back surgery  2002  . Back surgery  2011    [facility]   .  Cystoscopy/retrograde/ureteroscopy  12/04/2011    Procedure: CYSTOSCOPY/RETROGRADE/URETEROSCOPY;  Surgeon: Milford Cage, MD;  Location: [facility];  Service: Urology;  Laterality: Right;  . Cardiac catheterization  2008  . Abdominal hysterectomy    . Fracture surgery      right clavicle    Family History  Problem Relation Age of Onset  . Heart disease Mother   . Hypertension Mother   . Cancer Mother     cervical   . Heart attack Father   . Cancer Father     prostate  . Colon polyps Neg Hx   . Colon cancer Neg Hx     History  Substance Use Topics  . Smoking status: Never Smoker   . Smokeless tobacco: Never Used  . Alcohol Use: No    OB History   Grav Para Term Preterm Abortions TAB SAB Ect Mult Living                  Review of Systems Level 5 Caveat - Full ROS could not be obtained due to patient's severe illness.    Allergies  Morphine and Aspirin  Home Medications   Current Outpatient Rx  Name  Route  Sig  Dispense  Refill  . phenazopyridine (PYRIDIUM) 200 MG tablet   Oral   Take 1 tablet (200 mg total) by mouth 3 (three) times daily as needed for pain.   30 tablet   0     Physical Exam  Nursing note and vitals reviewed. Constitutional: She is oriented to person, place, and time. She appears well-developed and well-nourished.  HENT:  Head: Normocephalic and atraumatic.  Right Ear: External ear normal.  Left Ear: External ear normal.  Eyes: Conjunctivae and EOM are normal. Pupils are equal, round, and reactive to light.  Neck: Normal range of motion and phonation normal. Neck supple.  Cardiovascular: Normal rate, regular rhythm, normal heart sounds and intact distal pulses.   Pulmonary/Chest: Effort normal and breath sounds normal. She exhibits no bony tenderness.  Abdominal: Soft. Normal appearance. There is tenderness in the left upper quadrant.  Musculoskeletal: Normal range of motion.  Neurological: She is alert and oriented to person, place, and time. She has normal strength. No cranial nerve deficit or sensory deficit. She exhibits normal muscle tone. Coordination normal.  Skin: Skin is warm, dry and intact.  Psychiatric: She has a normal mood and affect. Her behavior is normal. Judgment and thought content normal.    ED Course  Procedures (including critical care time) DIAGNOSTIC STUDIES: Oxygen Saturation is 100% on room air, normal by my interpretation.    COORDINATION OF CARE: 10:51 AM- Will give Dilaudid 1 mg and Zofran 4 mg. Patient informed of current plan for treatment and evaluation and agrees with plan at this time.  11:37 AM- Will give additional 1 mg of Dilaudid. Will call patient's physician to discuss and arrange admission to [facility].  12:40 PM- Spoke with Dr. Vernie Ammons who will accept patient to the OR at [facility].   Filed Vitals:   07/25/12 1700  07/25/12 1715 07/25/12 1726 07/25/12 1746  BP: 127/89 126/88  151/79  Pulse: 58 59  63  Temp:   97.8 F (36.6 C) 98 F (36.7 C)  TempSrc:    Oral  Resp: 13 14  14   Height:    5\' 4"  (1.626 m)  Weight:    272 lb 1.6 oz (123.424 kg)  SpO2: 100% 99%  97%    Medications  HYDROmorphone (DILAUDID)  1 MG/ML injection (not administered)  lactated ringers infusion ( Intravenous Anesthesia Volume Adjustment 07/25/12 1608)  ondansetron (ZOFRAN) injection 4 mg (not administered)  zolpidem (AMBIEN) tablet 5 mg (not administered)  hyoscyamine (LEVSIN SL) SL tablet 0.125 mg (not administered)  acetaminophen (TYLENOL) tablet 650 mg (not administered)  traMADol (ULTRAM) tablet 50 mg (50 mg Oral Given 07/25/12 1826)  HYDROmorphone (DILAUDID) injection 1 mg (1 mg Intravenous Given 07/25/12 1051)  ondansetron (ZOFRAN) injection 4 mg (4 mg Intravenous Given 07/25/12 1051)  HYDROmorphone (DILAUDID) injection 1 mg (1 mg Intravenous Given 07/25/12 1130)  ciprofloxacin (CIPRO) IVPB 200 mg (200 mg Intravenous Given 07/25/12 1515)  HYDROmorphone (DILAUDID) injection 1 mg (1 mg Intravenous Given 07/25/12 1330)  ondansetron (ZOFRAN) injection 4 mg (4 mg Intravenous Given 07/25/12 1330)    Discussed with Dr Vernie Ammons, Urology; he accepts the pt to the OR at [facility] in [place]. He plans on placing a left ureter stent.   Labs Reviewed  GLUCOSE, CAPILLARY - Abnormal; Notable for the following:    Glucose-Capillary 118 (*)    All other components within normal limits  GLUCOSE, CAPILLARY - Abnormal; Notable for the following:    Glucose-Capillary 122 (*)    All other components within normal limits  POCT I-STAT, CHEM 8 - Abnormal; Notable for the following:    Creatinine, Ser 1.20 (*)    Glucose, Bld 108 (*)    All other components within normal limits  CBC WITH DIFFERENTIAL    Dg Abd 1 View  07/25/2012  *RADIOLOGY REPORT*  Clinical Data: History of left nephrolithiasis.  History of left UPJ calculus.  History of flank pain.   History of hypertension and diabetes.  ABDOMEN - 1 VIEW  Comparison: 07/24/2012.CT 07/23/2012.  Findings: Calcific density projects over the superior aspect of the left transverse process of L2. This corresponds to the 9 x 7 mm calculus in the left UPJ region on previous CT.  No other definite calculus is seen.  Previous posterior spinal fusion surgery been performed with hardware at L4-L5 level without evidence of disruption.  There is moderate fecal distention of portions of the colon.  IMPRESSION: Calcific density projects over the superior aspect of the left transverse process of L2. This corresponds to the 9 x 7 mm calculus in the left UPJ region on previous CT.   Original Report Authenticated By: Onalee Hua Call      1. Ureteral calculus, left   2. Renal colic   3. Ureteral stone       MDM  Renal colic, intractable, worsening sx, uncontrolled with maximal home treatment. Pt needs urgent surgical intervention. Doubt ARF, sepsis, metabolic instability. Stable for transfer to [place] via [facility].      I personally performed the services described in this documentation, which was scribed in my presence. The recorded information has been reviewed and is accurate.     Flint Melter, MD 07/25/12 2035

## 2012-07-25 NOTE — Anesthesia Postprocedure Evaluation (Signed)
Anesthesia Post Note  Patient: Angela [name]  Procedure(s) Performed: Procedure(s) (LRB): CYSTOSCOPY, left retograde pyelogram WITH left ureteral  STENT PLACEMENT (Left)  Anesthesia type: General  Patient location: PACU  Post pain: Pain level controlled  Post assessment: Post-op Vital signs reviewed  Last Vitals: BP 126/88  Pulse 59  Temp(Src) 36.6 C (Oral)  Resp 14  Ht 5\' 4"  (1.626 m)  SpO2 99%  Post vital signs: Reviewed  Level of consciousness: sedated  Complications: No apparent anesthesia complications

## 2012-07-25 NOTE — Op Note (Signed)
PATIENT:  Angela [name]  Preoperative diagnosis:  1. Left proximal ureteral calculus 2. Intractable left flank pain  Postoperative diagnosis:  1. Same  Procedure:  1. Cystoscopy 2. Left retrograde pyelogram with interpretation 3. Left double-J stent placement (6 [place], 23 cm) no tether  Surgeon: Veverly Fells. Vernie Ammons, M.D.  Anesthesia: General  Complications: None  EBL: Minimal  Specimens: None  Indication: [name] is a is a 52 year old female with a known left proximal ureteral stone she was recently seen and scheduled for lithotripsy however she presented to any emergency room with intractable flank pain that was not controlled with parenteral narcotic pain medication. After reviewing the management options for treatment, she has elected to proceed with the above surgical procedure(s). We have discussed the potential benefits and risks of the procedure, side effects of the proposed treatment, the likelihood of the patient achieving the goals of the procedure, and any potential problems that might occur during the procedure or recuperation. Informed consent has been obtained.  Description of procedure:  The patient was taken to the operating room and general anesthesia was induced.  The patient was placed in the dorsal lithotomy position, prepped and draped in the usual sterile fashion, and preoperative antibiotics were administered. A preoperative time-out was performed.   Cystourethroscopy was performed with the 22 French rigid cystoscope.  The patient's urethra was examined and was normal. The bladder was then systematically examined in its entirety. There was no evidence for any bladder tumors, stones, or other mucosal pathology.  Both ureteral orifices were of normal configuration and position.  Attention then turned to the left ureteral orifice and a ureteral catheter was used to intubate the ureteral orifice.  Omnipaque contrast was injected through the ureteral catheter and a  retrograde pyelogram was performed. It revealed a normal ureter up to the level of the stone where this was noted as a filling defect. Proximal to this the ureter was slightly dilated. The intrarenal collecting system was noted be normal with no filling defects or mass effect.  A 0.038 sensor guidewire was then advanced up the left ureter into the renal pelvis under fluoroscopic guidance.  The wire was then backloaded through the cystoscope and a ureteral stent was advance over the wire using Seldinger technique.  The stent was positioned appropriately under fluoroscopic and cystoscopic guidance.  The wire was then removed with an adequate stent curl noted in the renal pelvis as well as in the bladder.  The bladder was then emptied and the procedure ended.  The patient appeared to tolerate the procedure well and without complications.  The patient was able to be awakened and transferred to the recovery unit in satisfactory condition.

## 2012-07-26 ENCOUNTER — Encounter (HOSPITAL_COMMUNITY): Payer: Self-pay | Admitting: Urology

## 2012-07-26 NOTE — Discharge Summary (Signed)
Physician Discharge Summary  [name] MRN: [id number] DOB/AGE: 52-Sep-1962 52 y.o.  Admit date: 07/25/2012 Discharge date: 07/26/2012  Admission Diagnoses: Patient didn't have a ride home or someone to stay with her at home when she got there.  Discharge Diagnoses:  Social admission as above  Discharged Condition: good  Hospital Course: Patient underwent an uneventful left stent placement due to renal colic from a proximal ureteral stone. She did not have a ride home or anyone to stay with her so she was admitted for social reasons not medical reasons.She was discharged in the morning.   Significant Diagnostic Studies: Dg Abd 1 View  07/25/2012  *RADIOLOGY REPORT*  Clinical Data: History of left nephrolithiasis.  History of left UPJ calculus.  History of flank pain.  History of hypertension and diabetes.  ABDOMEN - 1 VIEW  Comparison: 07/24/2012.CT 07/23/2012.  Findings: Calcific density projects over the superior aspect of the left transverse process of L2. This corresponds to the 9 x 7 mm calculus in the left UPJ region on previous CT.  No other definite calculus is seen.  Previous posterior spinal fusion surgery been performed with hardware at L4-L5 level without evidence of disruption.  There is moderate fecal distention of portions of the colon.  IMPRESSION: Calcific density projects over the superior aspect of the left transverse process of L2. This corresponds to the 9 x 7 mm calculus in the left UPJ region on previous CT.   Original Report Authenticated By: Onalee Hua Call     Discharge Exam: Blood pressure 119/63, pulse 64, temperature 98.5 F (36.9 C), temperature source Oral, resp. rate 18, height 5\' 4"  (1.626 m), weight 123.424 kg (272 lb 1.6 oz), SpO2 97.00%.   Disposition: 01-Home or Self Care  Discharge Orders   Future Appointments Provider Department Dept Phone   08/02/2012 9:30 AM Kerri Perches, MD [facility] [phone]   Patient should  bring all necessary paperwork to be completed.  Arrive 15 minutes prior to the appointment.   Future Orders Complete By Expires     Discharge patient  As directed     Discharge patient  As directed         Medication List    TAKE these medications       albuterol 108 (90 BASE) MCG/ACT inhaler  Commonly known as:  PROVENTIL HFA;VENTOLIN HFA  Inhale 2 puffs into the lungs every 6 (six) hours as needed. For shortness of breath     amLODipine 5 MG tablet  Commonly known as:  NORVASC  Take 1 tablet (5 mg total) by mouth daily.     atorvastatin 20 MG tablet  Commonly known as:  LIPITOR  Take 20 mg by mouth daily.     budesonide-formoterol 160-4.5 MCG/ACT inhaler  Commonly known as:  SYMBICORT  Inhale 2 puffs into the lungs 2 (two) times daily as needed (Shortness of Breath).     diazepam 5 MG tablet  Commonly known as:  VALIUM  Take 5 mg by mouth 3 (three) times daily.     ergocalciferol 50000 UNITS capsule  Commonly known as:  VITAMIN D2  Take 50,000 Units by mouth once a week. Takes on Monday     esomeprazole 40 MG capsule  Commonly known as:  NEXIUM  Take 40 mg by mouth daily before breakfast.     fluticasone 50 MCG/ACT nasal spray  Commonly known as:  FLONASE  Place 2 sprays into the nose as needed for rhinitis.  gabapentin 800 MG tablet  Commonly known as:  NEURONTIN  Take 800 mg by mouth 4 (four) times daily.     ibuprofen 200 MG tablet  Commonly known as:  ADVIL,MOTRIN  Take 400-800 mg by mouth as needed. For pain     lisinopril 5 MG tablet  Commonly known as:  PRINIVIL,ZESTRIL  Take 5 mg by mouth daily.     metFORMIN 1000 MG tablet  Commonly known as:  GLUCOPHAGE  Take 1,000 mg by mouth 2 (two) times daily with a meal.     montelukast 10 MG tablet  Commonly known as:  SINGULAIR  Take 10 mg by mouth daily.     NITROLINGUAL 0.4 MG/SPRAY spray  Generic drug:  nitroGLYCERIN  Place 1 spray under the tongue every 5 (five) minutes as needed. For chest  pain     oxyCODONE 40 MG 12 hr tablet  Commonly known as:  OXYCONTIN  Take 40 mg by mouth 2 (two) times daily as needed for pain.     oxyCODONE-acetaminophen 5-325 MG per tablet  Commonly known as:  PERCOCET/ROXICET  Take 1 tablet by mouth every 4 (four) hours as needed for pain.     phenazopyridine 200 MG tablet  Commonly known as:  PYRIDIUM  Take 1 tablet (200 mg total) by mouth 3 (three) times daily as needed for pain.     potassium chloride SA 20 MEQ tablet  Commonly known as:  K-DUR,KLOR-CON  Take 20 mEq by mouth daily with breakfast.     zolpidem 10 MG tablet  Commonly known as:  AMBIEN  Take 10 mg by mouth at bedtime as needed for sleep.           Follow-up Information   Follow up with Milford Cage, MD. (On Monday for your scheduled lithotripsy)    Contact information:   [address] [place] FLOOR [address] [place] [address] [phone]       Signed: Garnett Farm 07/26/2012, 10:45 AM

## 2012-07-29 ENCOUNTER — Ambulatory Visit (HOSPITAL_COMMUNITY)
Admission: RE | Admit: 2012-07-29 | Discharge: 2012-07-29 | Disposition: A | Discharge status: Home / Self Care | Payer: PRIVATE HEALTH INSURANCE | Source: Ambulatory Visit | Attending: Urology | Admitting: Urology

## 2012-07-29 ENCOUNTER — Encounter (HOSPITAL_COMMUNITY): Payer: Self-pay | Admitting: General Practice

## 2012-07-29 ENCOUNTER — Encounter (HOSPITAL_COMMUNITY)
Admission: RE | Disposition: A | Discharge status: Home / Self Care | Payer: Self-pay | Source: Ambulatory Visit | Attending: Urology

## 2012-07-29 ENCOUNTER — Ambulatory Visit (HOSPITAL_COMMUNITY): Payer: PRIVATE HEALTH INSURANCE

## 2012-07-29 ENCOUNTER — Other Ambulatory Visit: Payer: Self-pay

## 2012-07-29 DIAGNOSIS — I252 Old myocardial infarction: Secondary | ICD-10-CM | POA: Insufficient documentation

## 2012-07-29 DIAGNOSIS — J45909 Unspecified asthma, uncomplicated: Secondary | ICD-10-CM | POA: Insufficient documentation

## 2012-07-29 DIAGNOSIS — Z886 Allergy status to analgesic agent status: Secondary | ICD-10-CM | POA: Insufficient documentation

## 2012-07-29 DIAGNOSIS — IMO0002 Reserved for concepts with insufficient information to code with codable children: Secondary | ICD-10-CM | POA: Insufficient documentation

## 2012-07-29 DIAGNOSIS — E1142 Type 2 diabetes mellitus with diabetic polyneuropathy: Secondary | ICD-10-CM | POA: Insufficient documentation

## 2012-07-29 DIAGNOSIS — M129 Arthropathy, unspecified: Secondary | ICD-10-CM | POA: Insufficient documentation

## 2012-07-29 DIAGNOSIS — E0590 Thyrotoxicosis, unspecified without thyrotoxic crisis or storm: Secondary | ICD-10-CM | POA: Insufficient documentation

## 2012-07-29 DIAGNOSIS — E7800 Pure hypercholesterolemia, unspecified: Secondary | ICD-10-CM | POA: Insufficient documentation

## 2012-07-29 DIAGNOSIS — Z9889 Other specified postprocedural states: Secondary | ICD-10-CM | POA: Insufficient documentation

## 2012-07-29 DIAGNOSIS — Z885 Allergy status to narcotic agent status: Secondary | ICD-10-CM | POA: Insufficient documentation

## 2012-07-29 DIAGNOSIS — Z6841 Body Mass Index (BMI) 40.0 and over, adult: Secondary | ICD-10-CM | POA: Insufficient documentation

## 2012-07-29 DIAGNOSIS — E1149 Type 2 diabetes mellitus with other diabetic neurological complication: Secondary | ICD-10-CM | POA: Insufficient documentation

## 2012-07-29 DIAGNOSIS — I4892 Unspecified atrial flutter: Secondary | ICD-10-CM | POA: Insufficient documentation

## 2012-07-29 DIAGNOSIS — I10 Essential (primary) hypertension: Secondary | ICD-10-CM | POA: Insufficient documentation

## 2012-07-29 DIAGNOSIS — N200 Calculus of kidney: Secondary | ICD-10-CM | POA: Insufficient documentation

## 2012-07-29 SURGERY — LITHOTRIPSY, ESWL
Anesthesia: LOCAL | Laterality: Left

## 2012-07-29 MED ORDER — CIPROFLOXACIN HCL 500 MG PO TABS
500.0000 mg | ORAL_TABLET | ORAL | Status: AC
  Administered 2012-07-29: 500 mg via ORAL
  Filled 2012-07-29: qty 1

## 2012-07-29 MED ORDER — DIPHENHYDRAMINE HCL 25 MG PO CAPS
25.0000 mg | ORAL_CAPSULE | ORAL | Status: AC
  Administered 2012-07-29: 25 mg via ORAL
  Filled 2012-07-29: qty 1

## 2012-07-29 MED ORDER — DIAZEPAM 5 MG PO TABS
10.0000 mg | ORAL_TABLET | ORAL | Status: AC
  Administered 2012-07-29: 10 mg via ORAL
  Filled 2012-07-29: qty 2

## 2012-07-29 MED ORDER — OXYCODONE HCL 40 MG PO TB12: 40.0000 mg | ORAL_TABLET | Freq: Two times a day (BID) | ORAL | Status: DC | PRN

## 2012-07-29 MED ORDER — DEXTROSE-NACL 5-0.45 % IV SOLN
INTRAVENOUS | Status: DC
  Administered 2012-07-29: 14:00:00 via INTRAVENOUS

## 2012-07-29 NOTE — Brief Op Note (Signed)
07/29/2012  4:20 PM  PATIENT:  Angela [name]  52 y.o. female  PRE-OPERATIVE DIAGNOSIS:  LEFT URETER STONE  POST-OPERATIVE DIAGNOSIS:  Same  PROCEDURE:  Procedure(s): EXTRACORPOREAL SHOCK WAVE LITHOTRIPSY (ESWL) (Left)  SURGEON:  Surgeon(s) and Role:    * Milford Cage, MD - Primary  PHYSICIAN ASSISTANT:   ASSISTANTS: none   ANESTHESIA:   IV sedation  EBL:   None  BLOOD ADMINISTERED:none  DRAINS: none   LOCAL MEDICATIONS USED:  NONE  SPECIMEN:  No Specimen  DISPOSITION OF SPECIMEN:  N/A  COUNTS:  YES  TOURNIQUET:  * No tourniquets in log *  DICTATION: .Note written in paper chart  PLAN OF CARE: Discharge to home after PACU  PATIENT DISPOSITION:  Short Stay   Delay start of Pharmacological VTE agent (>24hrs) due to surgical blood loss or risk of bleeding: yes

## 2012-07-29 NOTE — H&P (Signed)
Urology History and Physical Exam  CC: Left ureter stone.  HPI:  A 52 year old female presents today for a left ureter stone. This is located in the proximal ureter. It was discovered due to left-sided flank pain. She had a CT scan in the ER on 07/23/12. This revealed a stone left proximal ureter. It was 1.1 cm in size. It is not associated with fever or urinary tract infection. It is visible on plain film x-ray. This is an acute problem, but she has chronic problems with stones. She presented again to the ER on 07/25/12 and required placement of a left ureter stent due to pain. We have reviewed options for treatment and she presents today for shockwave lithotripsy of the left proximal stone. We have discussed risks, benefits, alternatives, and the likelihood of achieving this. Her UA 07/24/12 was negative for signs of infection.  PMH: Past Medical History  Diagnosis Date  . Low back pain   . DM type 2 (diabetes mellitus, type 2)   . Helicobacter pylori gastritis 2008  . Severe obesity (BMI >= 40) NOV 2008 270 LBS  . Hypokalemia   . Hyperthyroidism     s/p radiation  . GERD (gastroesophageal reflux disease)   . H/O hiatal hernia   . Anxiety   . Depression   . Asthma     chest  x ray  12/12 Epic  . Hypertension     cath  8/21 2008 EPIC, eccho 06/14/10 EPIC- EF 60-65%  . Hypercholesterolemia   . Neuromuscular disorder     DDD   with spasms/  diabetic neuropathy  . Myocardial infarction 1994     no  pain since 2008/ stress test  10/15/2006  . Dysrhythmia     occ flutter per patient history  . Sleep apnea     STOP BANG SCORE 6no cpap used  . Chronic kidney disease     stones  . Headache     migraines  . Arthritis     all over    PSH: Past Surgical History  Procedure Laterality Date  . Cholecystectomy  1996  . Left knee arthroscopic surgery  1999  . Left salphingectomy secondary to ectopic pregnancy  1991  . Partial hysterectomy  1991  . Knee arthroscopy  10/04/2010,    right knee arthroscopy, dr Thomasena Edis  . Upper gastrointestinal endoscopy  2008 abd pain    H. pylori gastritis  . Upper gastrointestinal endoscopy  1996 [facility] [place]    PUD  . Colonoscopy  2000 [facility]    INT HEMORRHOIDS/FISSURE  . Colonoscopy  2003 [facility], CHANGE IN BOWEL HABITS    INT HEMORRHOIDS  . Colonoscopy  2006 [facility]    INT HEMORRHOIDS  . Colonoscopy  2007 [facility]    INT HEMORRHOIDS  . Esophagogastroduodenoscopy  2008    JYN:WGNFA hiatal hernia./Normal esophagus without evidence of Barrett's mass, stricture, erosion or ulceration./Normal duodenal bulb and second portion of the duodenum./Diffuse erythema in the body and the antrum with occasional erosion.  Biopsies obtained via cold forceps to evaluate for H. pylori gastritis  . Back surgery  1993  . Back surgery  1994  . Back surgery  2002  . Back surgery  2011    [facility]   . Cystoscopy/retrograde/ureteroscopy  12/04/2011    Procedure: CYSTOSCOPY/RETROGRADE/URETEROSCOPY;  Surgeon: Milford Cage, MD;  Location: [facility];  Service: Urology;  Laterality: Right;  . Cardiac catheterization  2008  . Abdominal hysterectomy    . Fracture surgery  right clavicle  . Cystoscopy with stent placement Left 07/25/2012    Procedure: CYSTOSCOPY, left retograde pyelogram WITH left ureteral  STENT PLACEMENT;  Surgeon: Garnett Farm, MD;  Location: [facility];  Service: Urology;  Laterality: Left;    Allergies: Allergies  Allergen Reactions  . Morphine Other (See Comments)    Reaction with esophagus, unable to swallow.   . Aspirin Other (See Comments)    Reaction with esophagus, unable to swallow.     Medications: No prescriptions prior to admission     Social History: History   Social History  . Marital Status: Single    Spouse Name: N/A    Number of Children: 1  . Years of Education: N/A   Occupational History  . disable     Social History Main Topics  . Smoking status: Never Smoker   . Smokeless tobacco: Never Used  .  Alcohol Use: No  . Drug Use: No  . Sexually Active: Yes    Birth Control/ Protection: Surgical   Other Topics Concern  . Not on file   Social History Narrative  . No narrative on file    Family History: Family History  Problem Relation Age of Onset  . Heart disease Mother   . Hypertension Mother   . Cancer Mother     cervical   . Heart attack Father   . Cancer Father     prostate  . Colon polyps Neg Hx   . Colon cancer Neg Hx     Review of Systems: Positive: Urinary frequency. Negative: Chest pain, fever, or SOB.  A further 10 point review of systems was negative except what is listed in the HPI.  Physical Exam: Filed Vitals:   07/29/12 1303  BP: 122/75  Pulse: 73  Temp: 98.1 F (36.7 C)  Resp: 18    General: No acute distress.  Awake. Head:  Normocephalic.  Atraumatic. ENT:  EOMI.  Mucous membranes moist Neck:  Supple.  No lymphadenopathy. CV:  S1 present. S2 present. Regular rate. Pulmonary: Equal effort bilaterally.  Clear to auscultation bilaterally. Abdomen: Soft.  Non- tender to palpation. Skin:  Normal turgor.  No visible rash. Extremity: No gross deformity of bilateral upper extremities.  No gross deformity of    bilateral lower extremities. Neurologic: Alert. Appropriate mood.   Studies:  No results found for this basename: HGB, WBC, PLT,  in the last 72 hours  No results found for this basename: NA, K, CL, CO2, BUN, CREATININE, CALCIUM, MAGNESIUM, GFRNONAA, GFRAA,  in the last 72 hours   No results found for this basename: PT, INR, APTT,  in the last 72 hours   No components found with this basename: ABG,     Assessment:  Left ureter stone.  Plan: Continue with shockwave lithotripsy of left ureter stone.

## 2012-07-31 LAB — LIPID PANEL
Cholesterol: 164 mg/dL (ref 0–200)
LDL Cholesterol: 100 mg/dL — ABNORMAL HIGH (ref 0–99)
VLDL: 15 mg/dL (ref 0–40)

## 2012-07-31 LAB — COMPLETE METABOLIC PANEL WITH GFR
ALT: 16 U/L (ref 0–35)
Alkaline Phosphatase: 74 U/L (ref 39–117)
Sodium: 141 mEq/L (ref 135–145)
Total Bilirubin: 0.3 mg/dL (ref 0.3–1.2)
Total Protein: 7 g/dL (ref 6.0–8.3)

## 2012-07-31 LAB — HEMOGLOBIN A1C: Mean Plasma Glucose: 143 mg/dL — ABNORMAL HIGH (ref <117)

## 2012-08-02 ENCOUNTER — Encounter: Payer: Self-pay | Admitting: Family Medicine

## 2012-08-02 ENCOUNTER — Ambulatory Visit (INDEPENDENT_AMBULATORY_CARE_PROVIDER_SITE_OTHER): Payer: PRIVATE HEALTH INSURANCE | Admitting: Family Medicine

## 2012-08-02 ENCOUNTER — Telehealth: Payer: Self-pay | Admitting: Family Medicine

## 2012-08-02 VITALS — BP 148/82 | HR 76 | Resp 18 | Ht 65.5 in | Wt 275.1 lb

## 2012-08-02 DIAGNOSIS — E785 Hyperlipidemia, unspecified: Secondary | ICD-10-CM

## 2012-08-02 DIAGNOSIS — G4730 Sleep apnea, unspecified: Secondary | ICD-10-CM

## 2012-08-02 DIAGNOSIS — F329 Major depressive disorder, single episode, unspecified: Secondary | ICD-10-CM

## 2012-08-02 DIAGNOSIS — Z0000 Encounter for general adult medical examination without abnormal findings: Secondary | ICD-10-CM

## 2012-08-02 DIAGNOSIS — G8929 Other chronic pain: Secondary | ICD-10-CM | POA: Insufficient documentation

## 2012-08-02 DIAGNOSIS — E119 Type 2 diabetes mellitus without complications: Secondary | ICD-10-CM

## 2012-08-02 MED ORDER — ATORVASTATIN CALCIUM 40 MG PO TABS: 40.0000 mg | ORAL_TABLET | Freq: Every day | ORAL | Status: DC

## 2012-08-02 NOTE — Assessment & Plan Note (Signed)
Controlled, no change in medication Patient advised to reduce carb and sweets, commit to regular physical activity, take meds as prescribed, test blood as directed, and attempt to lose weight, to improve blood sugar control.  
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
## 2012-08-02 NOTE — Telephone Encounter (Signed)
Pls let pt know she is being referred to Dr Maple Hudson, as we discussed re sleep apnea avl and treatment. I am entering in referral ox , so pls refer

## 2012-08-02 NOTE — Assessment & Plan Note (Signed)
Not at goal, increase dose of lipitor Hyperlipidemia:Low fat diet discussed and encouraged.

## 2012-08-02 NOTE — Assessment & Plan Note (Signed)
Annual wellness as documented. No vision or hearing impairment, no loss of cognitive function Severe depression also limitation in mobility with high fall risk Referred for therapy, also pt to start med which was prescribed since December

## 2012-08-02 NOTE — Patient Instructions (Addendum)
F/u in 2 month, call if you need me before  Dose increase in lipitor to 40 mg daily, oK to take TWO 20mg  tabs daily till done  Start fluoxetine daily and you are also referred to therapist

## 2012-08-02 NOTE — Assessment & Plan Note (Signed)
Uncontrolled, pt not suicidal or homicidal. Start fluoxetine and referred to psychologist

## 2012-08-19 ENCOUNTER — Encounter (HOSPITAL_COMMUNITY): Payer: Self-pay | Admitting: *Deleted

## 2012-08-20 ENCOUNTER — Encounter (HOSPITAL_COMMUNITY): Payer: Self-pay | Admitting: Psychiatry

## 2012-08-20 ENCOUNTER — Ambulatory Visit (INDEPENDENT_AMBULATORY_CARE_PROVIDER_SITE_OTHER): Payer: PRIVATE HEALTH INSURANCE | Admitting: Psychiatry

## 2012-08-20 DIAGNOSIS — F329 Major depressive disorder, single episode, unspecified: Secondary | ICD-10-CM

## 2012-08-22 ENCOUNTER — Other Ambulatory Visit: Payer: Self-pay

## 2012-08-22 MED ORDER — OXYCODONE HCL 40 MG PO TB12: 40.0000 mg | ORAL_TABLET | Freq: Two times a day (BID) | ORAL | Status: DC

## 2012-08-23 NOTE — Patient Instructions (Signed)
Discussed orally 
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
## 2012-08-28 ENCOUNTER — Other Ambulatory Visit: Payer: Self-pay | Admitting: Urology

## 2012-08-28 ENCOUNTER — Ambulatory Visit (HOSPITAL_COMMUNITY): Payer: Self-pay | Admitting: Psychiatry

## 2012-08-30 ENCOUNTER — Encounter (HOSPITAL_COMMUNITY): Payer: Self-pay

## 2012-08-30 ENCOUNTER — Encounter (HOSPITAL_COMMUNITY): Payer: Self-pay | Admitting: Pharmacy Technician

## 2012-08-30 ENCOUNTER — Encounter (HOSPITAL_COMMUNITY)
Admission: RE | Admit: 2012-08-30 | Discharge: 2012-08-30 | Disposition: A | Discharge status: Home / Self Care | Payer: PRIVATE HEALTH INSURANCE | Source: Ambulatory Visit | Attending: Urology | Admitting: Urology

## 2012-08-30 LAB — CBC
MCH: 25.3 pg — ABNORMAL LOW (ref 26.0–34.0)
Platelets: 463 10*3/uL — ABNORMAL HIGH (ref 150–400)
RBC: 4.75 MIL/uL (ref 3.87–5.11)
RDW: 14.6 % (ref 11.5–15.5)
WBC: 6.3 10*3/uL (ref 4.0–10.5)

## 2012-08-30 LAB — BASIC METABOLIC PANEL
Calcium: 9.6 mg/dL (ref 8.4–10.5)
Creatinine, Ser: 0.78 mg/dL (ref 0.50–1.10)
GFR calc non Af Amer: >90 mL/min (ref >90)
Glucose, Bld: 111 mg/dL — ABNORMAL HIGH (ref 70–99)
Sodium: 138 mEq/L (ref 135–145)

## 2012-08-30 LAB — SURGICAL PCR SCREEN: Staphylococcus aureus: NEGATIVE

## 2012-08-30 NOTE — Patient Instructions (Addendum)
08-30-12 20 Turkey Henne  08/30/2012   Your procedure is scheduled on:   09-03-2012  Report to [facility] at    1000    AM.  Call this number if you have problems the morning of surgery: [phone]  Or Presurgical Testing [phone](Resa Rinks)   Remember: Follow any bowel prep instructions per MD office. For Cpap use: Bring mask and tubing only.   Do not eat food:After Midnight.  May have clear liquids:up to 6 Hours before arrival. Nothing after : 0600 AM  Clear liquids include soda, tea, black coffee, apple or grape juice, broth.  Take these medicines the morning of surgery with A SIP OF WATER: Amlodipine. Valium. Esomeprazole. Neurontin. Singulair. Bring Nitroglycerin .Use Symbicort inhaler and bring, also bring Albuterol inhaler and Flonase Nasal spray. Do Not take any Diabetic meds or insulin AM of surgery.   Do not wear jewelry, make-up or nail polish.  Do not wear lotions, powders, or perfumes. You may wear deodorant.  Do not shave 12 hours prior to first CHG shower(legs and under arms).(face and neck okay.)  Do not bring valuables to the hospital.  Contacts, dentures or bridgework,body piercing,  may not be worn into surgery.  Leave suitcase in the car. After surgery it may be brought to your room.  For patients admitted to the hospital, checkout time is 11:00 AM the day of discharge.   Patients discharged the day of surgery will not be allowed to drive home. Must have responsible person with you x 24 hours once discharged.  Name and phone number of your driver:daughter- Vincenza Hews [place] (has to work)[phone] cell  Special Instructions: CHG(Chlorhedine 4%-"Hibiclens","Betasept","Aplicare") Shower Use Special Wash: see special instructions.(avoid face and genitals)   Please read over the following fact sheets that you were given: MRSA Information.   Failure to follow these instructions may result in Cancellation of your surgery.   Patient  signature_______________________________________________________

## 2012-08-30 NOTE — Pre-Procedure Instructions (Addendum)
08-30-12 EKG 8'13-Epic. CT abd./pelvis with lungs clear in bases 07-23-12-Epic.

## 2012-09-03 ENCOUNTER — Encounter (HOSPITAL_COMMUNITY)
Admission: RE | Disposition: A | Discharge status: Home / Self Care | Payer: Self-pay | Source: Ambulatory Visit | Attending: Urology

## 2012-09-03 ENCOUNTER — Telehealth: Payer: Self-pay | Admitting: Family Medicine

## 2012-09-03 ENCOUNTER — Encounter (HOSPITAL_COMMUNITY): Payer: Self-pay | Admitting: Anesthesiology

## 2012-09-03 ENCOUNTER — Ambulatory Visit (HOSPITAL_COMMUNITY)
Admission: RE | Admit: 2012-09-03 | Discharge: 2012-09-03 | Disposition: A | Discharge status: Home / Self Care | Payer: PRIVATE HEALTH INSURANCE | Source: Ambulatory Visit | Attending: Urology | Admitting: Urology

## 2012-09-03 ENCOUNTER — Ambulatory Visit (HOSPITAL_COMMUNITY): Payer: PRIVATE HEALTH INSURANCE | Admitting: Anesthesiology

## 2012-09-03 ENCOUNTER — Encounter (HOSPITAL_COMMUNITY): Payer: Self-pay | Admitting: *Deleted

## 2012-09-03 DIAGNOSIS — G4730 Sleep apnea, unspecified: Secondary | ICD-10-CM | POA: Insufficient documentation

## 2012-09-03 DIAGNOSIS — I252 Old myocardial infarction: Secondary | ICD-10-CM | POA: Insufficient documentation

## 2012-09-03 DIAGNOSIS — N200 Calculus of kidney: Secondary | ICD-10-CM

## 2012-09-03 DIAGNOSIS — Z79899 Other long term (current) drug therapy: Secondary | ICD-10-CM | POA: Insufficient documentation

## 2012-09-03 DIAGNOSIS — K219 Gastro-esophageal reflux disease without esophagitis: Secondary | ICD-10-CM | POA: Insufficient documentation

## 2012-09-03 DIAGNOSIS — E0590 Thyrotoxicosis, unspecified without thyrotoxic crisis or storm: Secondary | ICD-10-CM | POA: Insufficient documentation

## 2012-09-03 DIAGNOSIS — E119 Type 2 diabetes mellitus without complications: Secondary | ICD-10-CM | POA: Insufficient documentation

## 2012-09-03 DIAGNOSIS — I10 Essential (primary) hypertension: Secondary | ICD-10-CM | POA: Insufficient documentation

## 2012-09-03 DIAGNOSIS — E7800 Pure hypercholesterolemia, unspecified: Secondary | ICD-10-CM | POA: Insufficient documentation

## 2012-09-03 HISTORY — PX: CYSTOSCOPY/RETROGRADE/URETEROSCOPY/STONE EXTRACTION WITH BASKET: SHX5317

## 2012-09-03 HISTORY — PX: HOLMIUM LASER APPLICATION: SHX5852

## 2012-09-03 LAB — GLUCOSE, CAPILLARY: Glucose-Capillary: 89 mg/dL (ref 70–99)

## 2012-09-03 SURGERY — HOLMIUM LASER APPLICATION
Anesthesia: General | Laterality: Left | Wound class: Clean Contaminated

## 2012-09-03 MED ORDER — ESOMEPRAZOLE MAGNESIUM 40 MG PO CPDR: 40.0000 mg | DELAYED_RELEASE_CAPSULE | Freq: Every day | ORAL | Status: DC

## 2012-09-03 MED ORDER — PROPOFOL 10 MG/ML IV BOLUS
INTRAVENOUS | Status: DC | PRN
  Administered 2012-09-03: 200 mg via INTRAVENOUS

## 2012-09-03 MED ORDER — HYOSCYAMINE SULFATE 0.125 MG PO TABS: 0.1250 mg | ORAL_TABLET | ORAL | Status: DC | PRN

## 2012-09-03 MED ORDER — FENTANYL CITRATE 0.05 MG/ML IJ SOLN: 25.0000 ug | INTRAMUSCULAR | Status: DC | PRN

## 2012-09-03 MED ORDER — ACETAMINOPHEN 10 MG/ML IV SOLN
INTRAVENOUS | Status: AC
  Filled 2012-09-03: qty 100

## 2012-09-03 MED ORDER — LIDOCAINE HCL 2 % EX GEL
CUTANEOUS | Status: AC
  Filled 2012-09-03: qty 10

## 2012-09-03 MED ORDER — ACETAMINOPHEN 10 MG/ML IV SOLN
INTRAVENOUS | Status: DC | PRN
  Administered 2012-09-03: 1000 mg via INTRAVENOUS

## 2012-09-03 MED ORDER — DEXTROSE 5 % IV SOLN
3.0000 g | INTRAVENOUS | Status: AC
  Administered 2012-09-03: 3 g via INTRAVENOUS

## 2012-09-03 MED ORDER — OXYCODONE-ACETAMINOPHEN 5-325 MG PO TABS: 1.0000 | ORAL_TABLET | ORAL | Status: DC | PRN

## 2012-09-03 MED ORDER — MIDAZOLAM HCL 5 MG/5ML IJ SOLN
INTRAMUSCULAR | Status: DC | PRN
  Administered 2012-09-03: 2 mg via INTRAVENOUS

## 2012-09-03 MED ORDER — LACTATED RINGERS IV SOLN
INTRAVENOUS | Status: DC | PRN
  Administered 2012-09-03: 12:00:00 via INTRAVENOUS

## 2012-09-03 MED ORDER — LACTATED RINGERS IV SOLN: INTRAVENOUS | Status: DC

## 2012-09-03 MED ORDER — BELLADONNA ALKALOIDS-OPIUM 16.2-60 MG RE SUPP
RECTAL | Status: DC | PRN
  Administered 2012-09-03: 1 via RECTAL

## 2012-09-03 MED ORDER — LIDOCAINE HCL 2 % EX GEL
CUTANEOUS | Status: DC | PRN
  Administered 2012-09-03: 1 via URETHRAL

## 2012-09-03 MED ORDER — SENNOSIDES-DOCUSATE SODIUM 8.6-50 MG PO TABS: 1.0000 | ORAL_TABLET | Freq: Two times a day (BID) | ORAL | Status: DC

## 2012-09-03 MED ORDER — IOHEXOL 300 MG/ML  SOLN
INTRAMUSCULAR | Status: AC
  Filled 2012-09-03: qty 1

## 2012-09-03 MED ORDER — LIDOCAINE HCL (CARDIAC) 20 MG/ML IV SOLN
INTRAVENOUS | Status: DC | PRN
  Administered 2012-09-03: 60 mg via INTRAVENOUS

## 2012-09-03 MED ORDER — CEFAZOLIN SODIUM-DEXTROSE 2-3 GM-% IV SOLR
INTRAVENOUS | Status: AC
  Filled 2012-09-03: qty 50

## 2012-09-03 MED ORDER — CEPHALEXIN 500 MG PO CAPS: 500.0000 mg | ORAL_CAPSULE | Freq: Three times a day (TID) | ORAL | Status: DC

## 2012-09-03 MED ORDER — FENTANYL CITRATE 0.05 MG/ML IJ SOLN
INTRAMUSCULAR | Status: DC | PRN
  Administered 2012-09-03 (×2): 50 ug via INTRAVENOUS

## 2012-09-03 MED ORDER — IOHEXOL 350 MG/ML SOLN
INTRAVENOUS | Status: DC | PRN
  Administered 2012-09-03: 18 mL via URETHRAL

## 2012-09-03 MED ORDER — CEFAZOLIN SODIUM 1-5 GM-% IV SOLN
INTRAVENOUS | Status: AC
  Filled 2012-09-03: qty 50

## 2012-09-03 MED ORDER — BELLADONNA ALKALOIDS-OPIUM 16.2-60 MG RE SUPP
RECTAL | Status: AC
  Filled 2012-09-03: qty 1

## 2012-09-03 MED ORDER — ONDANSETRON HCL 4 MG/2ML IJ SOLN
INTRAMUSCULAR | Status: DC | PRN
  Administered 2012-09-03: 4 mg via INTRAVENOUS

## 2012-09-03 MED ORDER — SODIUM CHLORIDE 0.9 % IR SOLN
Status: DC | PRN
  Administered 2012-09-03: 4000 mL

## 2012-09-03 MED ORDER — PHENAZOPYRIDINE HCL 100 MG PO TABS: 100.0000 mg | ORAL_TABLET | Freq: Three times a day (TID) | ORAL | Status: DC | PRN

## 2012-09-03 MED ORDER — PROMETHAZINE HCL 25 MG/ML IJ SOLN: 6.2500 mg | INTRAMUSCULAR | Status: DC | PRN

## 2012-09-03 SURGICAL SUPPLY — 40 items
BAG URO CATCHER STRL LF (DRAPE) ×2 IMPLANT
BASKET LASER NITINOL 1.9FR (BASKET) IMPLANT
BASKET STNLS GEMINI 4WIRE 3FR (BASKET) IMPLANT
BASKET ZERO TIP NITINOL 2.4FR (BASKET) ×1 IMPLANT
BRUSH URET BIOPSY 3F (UROLOGICAL SUPPLIES) IMPLANT
BSKT STON RTRVL 120 1.9FR (BASKET)
BSKT STON RTRVL GEM 120X11 3FR (BASKET)
BSKT STON RTRVL ZERO TP 2.4FR (BASKET) ×1
CATH CLEAR GEL 3F BACKSTOP (CATHETERS) IMPLANT
CATH URET 5FR 28IN CONE TIP (BALLOONS)
CATH URET 5FR 28IN OPEN ENDED (CATHETERS) ×2 IMPLANT
CATH URET 5FR 70CM CONE TIP (BALLOONS) IMPLANT
CATH URET DUAL LUMEN 6-10FR 50 (CATHETERS) IMPLANT
CLOTH BEACON ORANGE TIMEOUT ST (SAFETY) ×2 IMPLANT
DRAPE CAMERA CLOSED 9X96 (DRAPES) ×2 IMPLANT
GLOVE BIOGEL M 7.0 STRL (GLOVE) IMPLANT
GLOVE ECLIPSE 7.0 STRL STRAW (GLOVE) ×5 IMPLANT
GLOVE SURG SS PI 8.0 STRL IVOR (GLOVE) ×1 IMPLANT
GOWN PREVENTION PLUS LG XLONG (DISPOSABLE) ×2 IMPLANT
GOWN PREVENTION PLUS XLARGE (GOWN DISPOSABLE) ×1 IMPLANT
GOWN STRL REIN XL XLG (GOWN DISPOSABLE) ×2 IMPLANT
GUIDEWIRE ANG ZIPWIRE 038X150 (WIRE) IMPLANT
GUIDEWIRE STR DUAL SENSOR (WIRE) ×3 IMPLANT
IV NS IRRIG 3000ML ARTHROMATIC (IV SOLUTION) ×1 IMPLANT
KIT BALLIN UROMAX 15FX10 (LABEL) IMPLANT
KIT BALLN UROMAX 15FX4 (MISCELLANEOUS) IMPLANT
KIT BALLN UROMAX 26 75X4 (MISCELLANEOUS)
LASER FIBER DISP (UROLOGICAL SUPPLIES) ×1 IMPLANT
MANIFOLD NEPTUNE II (INSTRUMENTS) ×2 IMPLANT
MARKER SKIN DUAL TIP RULER LAB (MISCELLANEOUS) ×1 IMPLANT
PACK CYSTO (CUSTOM PROCEDURE TRAY) ×2 IMPLANT
SCRUB PCMX 4 OZ (MISCELLANEOUS) ×1 IMPLANT
SET HIGH PRES BAL DIL (LABEL)
SET IRRIG Y TYPE TUR BLADDER L (SET/KITS/TRAYS/PACK) ×1 IMPLANT
SHEATH ACCESS URETERAL 38CM (SHEATH) ×1 IMPLANT
SHEATH URET ACCESS 12FR/35CM (UROLOGICAL SUPPLIES) IMPLANT
SHEATH URET ACCESS 12FR/55CM (UROLOGICAL SUPPLIES) IMPLANT
STENT CONTOUR 6FRX24X.038 (STENTS) IMPLANT
SYRINGE IRR TOOMEY STRL 70CC (SYRINGE) IMPLANT
TUBING CONNECTING 10 (TUBING) ×2 IMPLANT

## 2012-09-03 NOTE — Transfer of Care (Signed)
Immediate Anesthesia Transfer of Care Note  Patient: Angela [name]  Procedure(s) Performed: Procedure(s): HOLMIUM LASER APPLICATION (Left) CYSTOSCOPY/RETROGRADE pyelogram/digital URETEROSCOPY/STONE EXTRACTION WITH BASKET, left stent removal (Left)  Patient Location: PACU  Anesthesia Type:General  Level of Consciousness: awake and alert   Airway & Oxygen Therapy: Patient Spontanous Breathing and Patient connected to face mask oxygen  Post-op Assessment: Report given to PACU RN and Post -op Vital signs reviewed and stable  Post vital signs: Reviewed and stable  Complications: No apparent anesthesia complications

## 2012-09-03 NOTE — Anesthesia Postprocedure Evaluation (Signed)
Anesthesia Post Note  Patient: Angela [name]  Procedure(s) Performed: Procedure(s) (LRB): HOLMIUM LASER APPLICATION (Left) CYSTOSCOPY/RETROGRADE pyelogram/digital URETEROSCOPY/STONE EXTRACTION WITH BASKET, left stent removal (Left)  Anesthesia type: General  Patient location: PACU  Post pain: Pain level controlled  Post assessment: Post-op Vital signs reviewed  Last Vitals:  Filed Vitals:   09/03/12 1500  BP:   Pulse: 62  Temp:   Resp: 16    Post vital signs: Reviewed  Level of consciousness: sedated  Complications: No apparent anesthesia complications

## 2012-09-03 NOTE — Op Note (Signed)
Urology Operative Report  Date of Procedure: 09/03/12  Surgeon: Natalia Leatherwood, MD Assistant: None  Preoperative Diagnosis: Left nephrolithiasis Postoperative Diagnosis:  Same  Procedure(s): Cystoscopy Left ureteroscopy Laser lithotripsy Left retrograde pyelogram Left ureter stent removal  Estimated blood loss: Minimal  Specimen: None  Drains: None  Complications: None  Findings: Left nephrolithiasis.  History of present illness: 52 year old female with long-standing history of nephrolithiasis. She had an obstructing left proximal ureter stone which underwent shockwave lithotripsy. This resolved the ureter stone, but some of the fragments fell in her left lower pole kidney where there were already existing fragments. Because she had a left ureter stent in place she elected to proceed with ureteroscopy to remove the remainder of the stones in the left kidney.   Procedure in detail: After informed consent was obtained, the patient was taken to the operating room. They were placed in the supine position. SCDs were turned on and in place. IV antibiotics were infused, and general anesthesia was induced. A timeout was performed in which the correct patient, surgical site, and procedure were identified and agreed upon by the team. A belladonna and opium suppository was placed into the rectum.  The patient was placed in a dorsolithotomy position, making sure to pad all pertinent neurovascular pressure points. The genitals were prepped and draped in the usual sterile fashion. Rigid cystoscope was advanced through the urethra and into the bladder. The bladder was drained and then filled to capacity with sterile fluid and evaluated in a systematic fashion to visualize the entire surface of the bladder. There were no lesions noted. Attention was turned a left ureter orifice. A left ureter stent was visible. A sensor tip wire was placed a long the stent up in the left renal pelvis under  fluoroscopy. This was secured to the drape as a safety wire. A stent grasper was used to grasp the tip of the left ureter stent and this was brought to the urethra meatus. This was cannulated with a second sensor tip wire which is up into the left renal pelvis under fluoroscopy. A 12/14 ureter access sheath was placed into the distal ureter and the stent and obturator were removed. A flexible ureter scope was then placed through the access sheath into the distal ureter where there were some small fragments of stone noted. These were removed with a 0 tip Nitinol basket. These were placed into the bladder. The ureter scope was then navigated with ease up into the left renal pelvis. There were noted to be stones in the lower pole of the kidney. One of these was grasped and tried to be removed through the ureter, but it was too large. It was placed back into the upper pole kidney.  A sensor tip wire was placed back in the left renal pelvis through the ureter scope and ureteroscope was backloaded off of the wire. A dual-lumen ureter access sheath was then placed under fluoroscopy over the working wire into the left renal pelvis. Working wire and obturator were then removed. Flexible digital ureter scope was placed into the left renal pelvis with a 200  holmium laser filament and lithotripsy was carried out on the stone fragments at a setting of 0.5 J and 20 Hz. Lithotripsy was performed until all the fragments were small enough to grasp and remove. A 0 tip Nitinol basket was then used to remove all stone fragments. After this was done the ureter access sheath and the digital ureter scope were withdrawn to visualize the entire ureter.  There were no strictured areas or any areas of mucosal disruption. Following this a left retrograde pyelogram was obtained when I cannulated the left distal ureter with a 5 French ureter catheter and injected contrast. There was no extravasation or hydronephrosis. The safety wire was then  removed. I did not feel that it was necessary to leave a ureter stent in place.  I injected 10 cc of lidocaine jelly into her urethra. She was placed back into a supine position, anesthesia was reversed, she was taken to the PACU in stable condition. All counts were correct.

## 2012-09-03 NOTE — Telephone Encounter (Signed)
Med sent.

## 2012-09-03 NOTE — H&P (Signed)
Urology History and Physical Exam  CC: Left nephrolithiasis  HPI:  52 year old female presents today for nephrolithiasis. This is located on the left side. This is a chronic problem. CT scan 07/23/12 revealed 2 stones in the lower pleura left kidney which measured 0.3 and 0.4 cm. She also had an obstructing left proximal ureter stone which was 1.1 Samara in size. This was associated with flank pain. She required a left ureter stent placement on 07/25/12. She underwent shockwave lithotripsy of the left proximal ureter stone on 07/29/12. Follow up in clinic on 08/27/12 revealed that the proximal stone had resolved. It appeared that she passed several the fragments and that another smaller portion of the fragment which was approximate 4 mm in size fill into the left lower pole. We discussed management options including observation of the stones and removal of the ureter stent which remains in place versus performing cystoscopy, left ureteroscopy, left retrograde pyelogram, left ureter stent removal, laser lithotripsy, and possible left ureter stent placement. We have discussed the risks, benefits, side effects, alternatives, and likelihood of achieving goals. Urine culture from 08/27/12 was negative for growth.  PMH: Past Medical History  Diagnosis Date  . Low back pain   . Helicobacter pylori gastritis 2008  . Severe obesity (BMI >= 40) NOV 2008 270 LBS  . Hypokalemia   . GERD (gastroesophageal reflux disease)   . H/O hiatal hernia   . Anxiety   . Depression   . Asthma     chest  x ray  12/12 Epic  . Hypertension     cath  8/21 2008 EPIC, eccho 06/14/10 EPIC- EF 60-65%  . Hypercholesterolemia   . Neuromuscular disorder     DDD   with spasms/  diabetic neuropathy  . Dysrhythmia     occ flutter per patient history  . Sleep apnea     STOP BANG SCORE 6no cpap used  . Headache     migraines  . Arthritis     all over  . DM type 2 (diabetes mellitus, type 2) 2004  . Chronic kidney disease    stones  . Renal stones 07/25/2012    Multiple episodes of renal stones, probably 5th since approx 2004  . Myocardial infarction 1994     no  pain since 2008/ stress test  10/15/2006  . Hyperthyroidism     s/p radiation    PSH: Past Surgical History  Procedure Laterality Date  . Cholecystectomy  1996  . Left knee arthroscopic surgery  1999  . Left salphingectomy secondary to ectopic pregnancy  1991  . Partial hysterectomy  1991  . Knee arthroscopy  10/04/2010,     right knee arthroscopy, dr Thomasena Edis  . Upper gastrointestinal endoscopy  2008 abd pain    H. pylori gastritis  . Upper gastrointestinal endoscopy  1996 [facility]    PUD  . Colonoscopy  2000 [facility]    INT HEMORRHOIDS/FISSURE  . Colonoscopy  2003 [facility], CHANGE IN BOWEL HABITS    INT HEMORRHOIDS  . Colonoscopy  2006 [facility]    INT HEMORRHOIDS  . Colonoscopy  2007 [facility]    INT HEMORRHOIDS  . Esophagogastroduodenoscopy  2008    [id number] hiatal hernia./Normal esophagus without evidence of Barrett's mass, stricture, erosion or ulceration./Normal duodenal bulb and second portion of the duodenum./Diffuse erythema in the body and the antrum with occasional erosion.  Biopsies obtained via cold forceps to evaluate for H. pylori gastritis  . Back surgery  1993  . Back surgery  1994  . Back surgery  2002  . Back surgery  2011    [facility]   . Cystoscopy/retrograde/ureteroscopy  12/04/2011    Procedure: CYSTOSCOPY/RETROGRADE/URETEROSCOPY;  Surgeon: Milford Cage, MD;  Location: [facility];  Service: Urology;  Laterality: Right;  . Cardiac catheterization  2008  . Abdominal hysterectomy    . Cystoscopy with stent placement Left 07/25/2012    Procedure: CYSTOSCOPY, left retograde pyelogram WITH left ureteral  STENT PLACEMENT;  Surgeon: Garnett Farm, MD;  Location: [facility];  Service: Urology;  Laterality: Left;  . Fracture surgery  1999    right clavicle    Allergies: Allergies  Allergen Reactions  . Morphine Other (See  Comments)    Reaction with esophagus, unable to swallow.   . Aspirin Other (See Comments)    Reaction with esophagus, unable to swallow.   . Adhesive (Tape) Rash    Medications: Prescriptions prior to admission  Medication Sig Dispense Refill  . albuterol (PROVENTIL HFA;VENTOLIN HFA) 108 (90 BASE) MCG/ACT inhaler Inhale 2 puffs into the lungs every 6 (six) hours as needed. For shortness of breath      . amLODipine (NORVASC) 5 MG tablet Take 5 mg by mouth every morning.      Marland Kitchen atorvastatin (LIPITOR) 40 MG tablet Take 40 mg by mouth every morning.      . budesonide-formoterol (SYMBICORT) 160-4.5 MCG/ACT inhaler Inhale 2 puffs into the lungs 2 (two) times daily as needed (Shortness of Breath).      . diazepam (VALIUM) 5 MG tablet Take 5 mg by mouth 3 (three) times daily.      . ergocalciferol (VITAMIN D2) 50000 UNITS capsule Take 50,000 Units by mouth once a week. Takes on Monday  12 capsule  1  . esomeprazole (NEXIUM) 40 MG capsule Take 1 capsule (40 mg total) by mouth daily before breakfast.  30 capsule  3  . FLUoxetine (PROZAC) 20 MG capsule Take 20 mg by mouth every evening.   30 capsule  2  . fluticasone (FLONASE) 50 MCG/ACT nasal spray Place 2 sprays into the nose daily as needed for rhinitis.       Marland Kitchen gabapentin (NEURONTIN) 800 MG tablet Take 800 mg by mouth 4 (four) times daily.      Marland Kitchen lisinopril (PRINIVIL,ZESTRIL) 5 MG tablet Take 5 mg by mouth every evening.       . metFORMIN (GLUCOPHAGE) 1000 MG tablet Take 1,000 mg by mouth 2 (two) times daily with a meal.      . montelukast (SINGULAIR) 10 MG tablet Take 10 mg by mouth daily.      . nitroGLYCERIN (NITROLINGUAL) 0.4 MG/SPRAY spray Place 1 spray under the tongue every 5 (five) minutes as needed. For chest pain      . oxyCODONE (OXYCONTIN) 40 MG 12 hr tablet Take 40 mg by mouth every 12 (twelve) hours.      . phenazopyridine (PYRIDIUM) 200 MG tablet Take 200 mg by mouth 3 (three) times daily as needed for pain.      . potassium  chloride SA (K-DUR,KLOR-CON) 20 MEQ tablet Take 20 mEq by mouth daily with breakfast.       . zolpidem (AMBIEN) 10 MG tablet Take 10 mg by mouth at bedtime.          Social History: History   Social History  . Marital Status: Single    Spouse Name: N/A    Number of Children: 1  . Years of Education: N/A   Occupational History  .  disable     Social History Main Topics  . Smoking status: Never Smoker   . Smokeless tobacco: Never Used  . Alcohol Use: No  . Drug Use: No  . Sexually Active: Yes    Birth Control/ Protection: Surgical   Other Topics Concern  . Not on file   Social History Narrative  . No narrative on file    Family History: Family History  Problem Relation Age of Onset  . Heart disease Mother   . Hypertension Mother   . Cancer Mother     cervical   . Heart attack Father   . Cancer Father     prostate  . Colon polyps Neg Hx   . Colon cancer Neg Hx     Review of Systems: Positive: Left flank pain. Negative: Fever, chest pain, or SOB.  A further 10 point review of systems was negative except what is listed in the HPI.  Physical Exam: Filed Vitals:   09/03/12 1022  BP: 153/91  Pulse: 73  Temp: 98.2 F (36.8 C)  Resp: 16    General: No acute distress.  Awake. Head:  Normocephalic.  Atraumatic. ENT:  EOMI.  Mucous membranes moist Neck:  Supple.  No lymphadenopathy. CV:  S1 present. S2 present. Regular rate. Pulmonary: Equal effort bilaterally.  Clear to auscultation bilaterally. Abdomen: Soft.  Non- tender to palpation. Skin:  Normal turgor.  No visible rash. Extremity: No gross deformity of bilateral upper extremities.  No gross deformity of    bilateral lower extremities. Neurologic: Alert. Appropriate mood.   Studies:  No results found for this basename: HGB, WBC, PLT,  in the last 72 hours  No results found for this basename: NA, K, CL, CO2, BUN, CREATININE, CALCIUM, MAGNESIUM, GFRNONAA, GFRAA,  in the last 72 hours   No results  found for this basename: PT, INR, APTT,  in the last 72 hours   No components found with this basename: ABG,     Assessment:  Left nephrolithiasis  Plan: To OR for cystoscopy, left ureteroscopy, left retrograde pyelogram, left ureter stent removal, laser lithotripsy, and possible left ureter stent placement.

## 2012-09-03 NOTE — Anesthesia Preprocedure Evaluation (Signed)
Anesthesia Evaluation  Patient identified by MRN, date of birth, ID band Patient awake    Reviewed: Allergy & Precautions, H&P , NPO status , Patient's Chart, lab work & pertinent test results  Airway Mallampati: II TM Distance: >3 FB Neck ROM: Full    Dental no notable dental hx. (+) Dental Advisory Given and Teeth Intact   Pulmonary shortness of breath, asthma , sleep apnea ,  Increased STOPBANG but no diagnosis of OSA yet. breath sounds clear to auscultation  Pulmonary exam normal       Cardiovascular hypertension, Pt. on medications + CAD and + Past MI (Patient states prior MI X 4; has ellected not to undergo any cardiac intervention; states she is stable and denies any cardiac symptoms) + dysrhythmias Rhythm:Regular Rate:Normal     Neuro/Psych  Headaches, PSYCHIATRIC DISORDERS Anxiety Depression    GI/Hepatic Neg liver ROS, hiatal hernia, GERD-  Medicated and Controlled,  Endo/Other  diabetes, Well Controlled, Type 2, Oral Hypoglycemic AgentsHyperthyroidism Morbid obesity  Renal/GU Renal InsufficiencyRenal diseasenegative Renal ROS  negative genitourinary   Musculoskeletal negative musculoskeletal ROS (+)   Abdominal (+) + obese,   Peds negative pediatric ROS (+)  Hematology negative hematology ROS (+)   Anesthesia Other Findings   Reproductive/Obstetrics negative OB ROS                           Anesthesia Physical Anesthesia Plan  ASA: III  Anesthesia Plan: General   Post-op Pain Management:    Induction: Intravenous  Airway Management Planned: LMA  Additional Equipment:   Intra-op Plan:   Post-operative Plan: Extubation in OR  Informed Consent: I have reviewed the patients History and Physical, chart, labs and discussed the procedure including the risks, benefits and alternatives for the proposed anesthesia with the patient or authorized representative who has indicated  his/her understanding and acceptance.   Dental advisory given  Plan Discussed with: CRNA  Anesthesia Plan Comments:         Anesthesia Quick Evaluation

## 2012-09-04 ENCOUNTER — Encounter (HOSPITAL_COMMUNITY): Payer: Self-pay | Admitting: Urology

## 2012-09-06 ENCOUNTER — Ambulatory Visit (HOSPITAL_COMMUNITY): Payer: Self-pay | Admitting: Psychiatry

## 2012-09-13 ENCOUNTER — Encounter: Payer: Self-pay | Admitting: Family Medicine

## 2012-09-13 ENCOUNTER — Ambulatory Visit (INDEPENDENT_AMBULATORY_CARE_PROVIDER_SITE_OTHER): Payer: PRIVATE HEALTH INSURANCE | Admitting: Family Medicine

## 2012-09-13 VITALS — BP 162/80 | HR 86 | Resp 18 | Ht 65.5 in | Wt 281.1 lb

## 2012-09-13 DIAGNOSIS — N200 Calculus of kidney: Secondary | ICD-10-CM

## 2012-09-13 DIAGNOSIS — E669 Obesity, unspecified: Secondary | ICD-10-CM

## 2012-09-13 DIAGNOSIS — E119 Type 2 diabetes mellitus without complications: Secondary | ICD-10-CM

## 2012-09-13 DIAGNOSIS — F329 Major depressive disorder, single episode, unspecified: Secondary | ICD-10-CM

## 2012-09-13 DIAGNOSIS — I10 Essential (primary) hypertension: Secondary | ICD-10-CM

## 2012-09-13 DIAGNOSIS — F3289 Other specified depressive episodes: Secondary | ICD-10-CM

## 2012-09-13 MED ORDER — AMLODIPINE BESYLATE 10 MG PO TABS: 10.0000 mg | ORAL_TABLET | Freq: Every day | ORAL | Status: DC

## 2012-09-13 NOTE — Progress Notes (Signed)
  Subjective:    [name], female    DOB: 1961/01/07, 52 y.o.   MRN: [id number]  HPI The PT is here for follow up and re-evaluation of chronic medical conditions, medication management and review of any available recent lab and radiology data.  Preventive health is updated, specifically  Cancer screening and Immunization.   Questions or concerns regarding consultations or procedures which the PT has had in the interim are  Addressed.Very satisfied with psychology eval and care, grateful for the help, depression is being addressed. Unfortunately just this past week had acute nephrolithiasis again, stones are being evaluated per pt Has used more pani med than usual but will stick on schedule, she did get meds from urologist also which is appropriate The PT denies any adverse reactions to current medications since the last visit.        Review of Systems See HPI Denies recent fever or chills. Denies sinus pressure, nasal congestion, ear pain or sore throat. Denies chest congestion, productive cough or wheezing. Denies chest pains, palpitations and leg swelling Increased  abdominal pain,in flank , denies  nausea, vomiting,diarrhea or constipation.   Denies dysuria, frequency, hesitancy or incontinence. Chronic  joint pain, and limitation in mobilityUnchanged. Denies headaches, seizures, numbness, or tingling. Denies uncontrolled  depression, anxiety or insomnia. Denies skin break down or rash.        Objective:   Physical Exam Patient alert and oriented and in no cardiopulmonary distress.  HEENT: No facial asymmetry, EOMI, no sinus tenderness,  oropharynx pink and moist.  Neck supple no adenopathy.  Chest: Clear to auscultation bilaterally.  CVS: S1, S2 no murmurs, no S3.  ABD: Soft non tender. Bowel sounds normal.Left flank tender  Ext: No edema  [id number]  ROM spine, shoulders, hips and knees.  Skin: Intact, no ulcerations or rash noted.  Psych:  Good eye contact, normal affect. Memory intact not anxious or depressed appearing.  CNS: CN 2-12 intact, power, tone and sensation normal throughout.        Assessment & Plan:

## 2012-09-13 NOTE — Patient Instructions (Addendum)
F/u in mid July, call if you need me before.  Dose increase in amlodipine to 10mg  one daily, blood pressure is too high OK to take TWO 5mg  tablets once daily till done    Non fasting HBa1C chem 7 and EGFR

## 2012-09-15 NOTE — Assessment & Plan Note (Signed)
Controlled, no change in medication Patient advised to reduce carb and sweets, commit to regular physical activity, take meds as prescribed, test blood as directed, and attempt to lose weight, to improve blood sugar control.  

## 2012-09-15 NOTE — Assessment & Plan Note (Signed)
Deteriorated. Patient re-educated about  the importance of commitment to a  minimum of 150 minutes of exercise per week. The importance of healthy food choices with portion control discussed. Encouraged to start a food diary, count calories and to consider  joining a support group. Sample diet sheets offered. Goals set by the patient for the next several months.    

## 2012-09-15 NOTE — Assessment & Plan Note (Signed)
Uncontrolled, dose increase in amlodipine DASH diet and commitment to daily physical activity for a minimum of 30 minutes discussed and encouraged, as a part of hypertension management. The importance of attaining a healthy weight is also discussed.  

## 2012-09-15 NOTE — Assessment & Plan Note (Signed)
Improving, has started herapy which she finds very helpful

## 2012-09-15 NOTE — Assessment & Plan Note (Signed)
Recurrent problem being managed by urology

## 2012-09-20 ENCOUNTER — Other Ambulatory Visit: Payer: Self-pay

## 2012-09-20 MED ORDER — OXYCODONE HCL 40 MG PO TB12
40.0000 mg | ORAL_TABLET | Freq: Two times a day (BID) | ORAL | Status: DC
Start: 2012-09-20 — End: 2012-10-17

## 2012-09-24 ENCOUNTER — Institutional Professional Consult (permissible substitution): Payer: PRIVATE HEALTH INSURANCE | Admitting: Internal Medicine

## 2012-10-02 ENCOUNTER — Telehealth: Payer: Self-pay | Admitting: Family Medicine

## 2012-10-02 NOTE — Telephone Encounter (Signed)
Spoke with pharmacy and clarified directions on an rx

## 2012-10-14 ENCOUNTER — Institutional Professional Consult (permissible substitution): Payer: PRIVATE HEALTH INSURANCE | Admitting: Pulmonary Disease

## 2012-10-17 ENCOUNTER — Other Ambulatory Visit: Payer: Self-pay

## 2012-10-17 MED ORDER — OXYCODONE HCL 40 MG PO TB12: 40.0000 mg | ORAL_TABLET | Freq: Two times a day (BID) | ORAL | Status: DC

## 2012-10-29 ENCOUNTER — Other Ambulatory Visit: Payer: Self-pay | Admitting: Family Medicine

## 2012-10-29 ENCOUNTER — Institutional Professional Consult (permissible substitution): Payer: PRIVATE HEALTH INSURANCE | Admitting: Pulmonary Disease

## 2012-10-29 DIAGNOSIS — Z139 Encounter for screening, unspecified: Secondary | ICD-10-CM

## 2012-10-31 ENCOUNTER — Ambulatory Visit (HOSPITAL_COMMUNITY)
Admission: RE | Admit: 2012-10-31 | Discharge: 2012-10-31 | Disposition: A | Discharge status: Home / Self Care | Payer: PRIVATE HEALTH INSURANCE | Source: Ambulatory Visit | Attending: Family Medicine | Admitting: Family Medicine

## 2012-10-31 DIAGNOSIS — Z139 Encounter for screening, unspecified: Secondary | ICD-10-CM

## 2012-10-31 DIAGNOSIS — Z1231 Encounter for screening mammogram for malignant neoplasm of breast: Secondary | ICD-10-CM | POA: Insufficient documentation

## 2012-11-07 ENCOUNTER — Emergency Department (HOSPITAL_COMMUNITY)
Admission: EM | Admit: 2012-11-07 | Discharge: 2012-11-08 | Discharge status: Against Medical Advice | Payer: PRIVATE HEALTH INSURANCE | Attending: Emergency Medicine | Admitting: Emergency Medicine

## 2012-11-07 ENCOUNTER — Encounter (HOSPITAL_COMMUNITY): Payer: Self-pay | Admitting: Emergency Medicine

## 2012-11-07 DIAGNOSIS — I252 Old myocardial infarction: Secondary | ICD-10-CM | POA: Insufficient documentation

## 2012-11-07 DIAGNOSIS — Z8619 Personal history of other infectious and parasitic diseases: Secondary | ICD-10-CM | POA: Insufficient documentation

## 2012-11-07 DIAGNOSIS — E876 Hypokalemia: Secondary | ICD-10-CM | POA: Insufficient documentation

## 2012-11-07 DIAGNOSIS — R110 Nausea: Secondary | ICD-10-CM | POA: Insufficient documentation

## 2012-11-07 DIAGNOSIS — N189 Chronic kidney disease, unspecified: Secondary | ICD-10-CM | POA: Insufficient documentation

## 2012-11-07 DIAGNOSIS — Z79899 Other long term (current) drug therapy: Secondary | ICD-10-CM | POA: Insufficient documentation

## 2012-11-07 DIAGNOSIS — R059 Cough, unspecified: Secondary | ICD-10-CM | POA: Insufficient documentation

## 2012-11-07 DIAGNOSIS — Z87442 Personal history of urinary calculi: Secondary | ICD-10-CM | POA: Insufficient documentation

## 2012-11-07 DIAGNOSIS — Z8719 Personal history of other diseases of the digestive system: Secondary | ICD-10-CM | POA: Insufficient documentation

## 2012-11-07 DIAGNOSIS — Z9889 Other specified postprocedural states: Secondary | ICD-10-CM | POA: Insufficient documentation

## 2012-11-07 DIAGNOSIS — Z8669 Personal history of other diseases of the nervous system and sense organs: Secondary | ICD-10-CM | POA: Insufficient documentation

## 2012-11-07 DIAGNOSIS — Z8639 Personal history of other endocrine, nutritional and metabolic disease: Secondary | ICD-10-CM | POA: Insufficient documentation

## 2012-11-07 DIAGNOSIS — G43909 Migraine, unspecified, not intractable, without status migrainosus: Secondary | ICD-10-CM

## 2012-11-07 DIAGNOSIS — R05 Cough: Secondary | ICD-10-CM | POA: Insufficient documentation

## 2012-11-07 DIAGNOSIS — I129 Hypertensive chronic kidney disease with stage 1 through stage 4 chronic kidney disease, or unspecified chronic kidney disease: Secondary | ICD-10-CM | POA: Insufficient documentation

## 2012-11-07 DIAGNOSIS — F3289 Other specified depressive episodes: Secondary | ICD-10-CM | POA: Insufficient documentation

## 2012-11-07 DIAGNOSIS — Z862 Personal history of diseases of the blood and blood-forming organs and certain disorders involving the immune mechanism: Secondary | ICD-10-CM | POA: Insufficient documentation

## 2012-11-07 DIAGNOSIS — F411 Generalized anxiety disorder: Secondary | ICD-10-CM | POA: Insufficient documentation

## 2012-11-07 DIAGNOSIS — J45909 Unspecified asthma, uncomplicated: Secondary | ICD-10-CM | POA: Insufficient documentation

## 2012-11-07 DIAGNOSIS — F329 Major depressive disorder, single episode, unspecified: Secondary | ICD-10-CM | POA: Insufficient documentation

## 2012-11-07 DIAGNOSIS — IMO0002 Reserved for concepts with insufficient information to code with codable children: Secondary | ICD-10-CM | POA: Insufficient documentation

## 2012-11-07 DIAGNOSIS — E119 Type 2 diabetes mellitus without complications: Secondary | ICD-10-CM | POA: Insufficient documentation

## 2012-11-07 DIAGNOSIS — Z8679 Personal history of other diseases of the circulatory system: Secondary | ICD-10-CM | POA: Insufficient documentation

## 2012-11-07 DIAGNOSIS — E7800 Pure hypercholesterolemia, unspecified: Secondary | ICD-10-CM | POA: Insufficient documentation

## 2012-11-07 MED ORDER — DIPHENHYDRAMINE HCL 50 MG/ML IJ SOLN
25.0000 mg | Freq: Once | INTRAMUSCULAR | Status: AC
  Administered 2012-11-08: 25 mg via INTRAVENOUS
  Filled 2012-11-07: qty 1

## 2012-11-07 MED ORDER — SODIUM CHLORIDE 0.9 % IV BOLUS (SEPSIS)
1000.0000 mL | Freq: Once | INTRAVENOUS | Status: AC
  Administered 2012-11-08: 1000 mL via INTRAVENOUS

## 2012-11-07 MED ORDER — METOCLOPRAMIDE HCL 5 MG/ML IJ SOLN
10.0000 mg | Freq: Once | INTRAMUSCULAR | Status: AC
  Administered 2012-11-08: 10 mg via INTRAVENOUS
  Filled 2012-11-07: qty 2

## 2012-11-07 MED ORDER — KETOROLAC TROMETHAMINE 30 MG/ML IJ SOLN
30.0000 mg | Freq: Once | INTRAMUSCULAR | Status: AC
  Administered 2012-11-08: 30 mg via INTRAVENOUS
  Filled 2012-11-07: qty 1

## 2012-11-07 NOTE — ED Notes (Signed)
Cool cloth applied to pt's forehead per request

## 2012-11-07 NOTE — ED Notes (Signed)
Patient states she has history of migraines; c/o headache pain x 2 days.  Denies N/V.

## 2012-11-07 NOTE — ED Notes (Addendum)
Pt with pain top and back at base of neck of head today, denies N/V, took tylenol earlier without relief

## 2012-11-07 NOTE — ED Provider Notes (Signed)
History    CSN: [id number] Arrival date & time 11/07/12  2227  First MD Initiated Contact with Patient 11/07/12 2314     Chief Complaint  Patient presents with  . Headache   HPI Angela [name] is a 52 y.o. female with multiple medical problems including chronic migraine headaches. Patient says she has a migraine that started about 2-3 days ago, is consistent with her typical migraine headaches, is typically resolved with Tylenol and lying down in a dark room and going to sleep. She has associated photophobia and nausea and had some retching yesterday, no vomiting, no numbness, tingling, weakness, fever, chills or stiff neck.  Headaches came on gradually, is been constant but intermittently worse, and it worsens tonight, was not worse at onset and did not begin with exertion. Patient also says since being hospitalized and having a procedure to remove multiple kidney stones he's had a dry cough that has been persistent.  Patient has asthma but denies any new shortness of breath or wheezing this evening.  Past Medical History  Diagnosis Date  . Low back pain   . Helicobacter pylori gastritis 2008  . Severe obesity (BMI >= 40) NOV 2008 270 LBS  . Hypokalemia   . GERD (gastroesophageal reflux disease)   . H/O hiatal hernia   . Anxiety   . Depression   . Asthma     chest  x ray  12/12 Epic  . Hypertension     cath  8/21 2008 EPIC, eccho 06/14/10 EPIC- EF 60-65%  . Hypercholesterolemia   . Neuromuscular disorder     DDD   with spasms/  diabetic neuropathy  . Dysrhythmia     occ flutter per patient history  . Sleep apnea     STOP BANG SCORE 6no cpap used  . Headache(784.0)     migraines  . Arthritis     all over  . DM type 2 (diabetes mellitus, type 2) 2004  . Chronic kidney disease     stones  . Renal stones 07/25/2012    Multiple episodes of renal stones, probably 5th since approx 2004  . Myocardial infarction 1994     no  pain since 2008/ stress test  10/15/2006  .  Hyperthyroidism     s/p radiation   Past Surgical History  Procedure Laterality Date  . Cholecystectomy  1996  . Left knee arthroscopic surgery  1999  . Left salphingectomy secondary to ectopic pregnancy  1991  . Partial hysterectomy  1991  . Knee arthroscopy  10/04/2010,     right knee arthroscopy, dr Thomasena Edis  . Upper gastrointestinal endoscopy  2008 abd pain    H. pylori gastritis  . Upper gastrointestinal endoscopy  1996 [facility] [place]    PUD  . Colonoscopy  2000 [facility]    INT HEMORRHOIDS/FISSURE  . Colonoscopy  2003 [facility], CHANGE IN BOWEL HABITS    INT HEMORRHOIDS  . Colonoscopy  2006 [facility]    INT HEMORRHOIDS  . Colonoscopy  [facility]    INT HEMORRHOIDS  . Esophagogastroduodenoscopy  2008    [id number] hiatal hernia./Normal esophagus without evidence of Barrett's mass, stricture, erosion or ulceration./Normal duodenal bulb and second portion of the duodenum./Diffuse erythema in the body and the antrum with occasional erosion.  Biopsies obtained via cold forceps to evaluate for H. pylori gastritis  . Back surgery  1993  . Back surgery  1994  . Back surgery  2002  . Back surgery  2011    [facility]   .  Cystoscopy/retrograde/ureteroscopy  12/04/2011    Procedure: CYSTOSCOPY/RETROGRADE/URETEROSCOPY;  Surgeon: Milford Cage, MD;  Location: [facility];  Service: Urology;  Laterality: Right;  . Cardiac catheterization  2008  . Abdominal hysterectomy    . Cystoscopy with stent placement Left 07/25/2012    Procedure: CYSTOSCOPY, left retograde pyelogram WITH left ureteral  STENT PLACEMENT;  Surgeon: Garnett Farm, MD;  Location: [facility];  Service: Urology;  Laterality: Left;  . Fracture surgery  1999    right clavicle  . Holmium laser application Left 09/03/2012    Procedure: HOLMIUM LASER APPLICATION;  Surgeon: Milford Cage, MD;  Location: [facility];  Service: Urology;  Laterality: Left;  . Cystoscopy/retrograde/ureteroscopy/stone extraction with basket Left 09/03/2012    Procedure:  CYSTOSCOPY/RETROGRADE pyelogram/digital URETEROSCOPY/STONE EXTRACTION WITH BASKET, left stent removal;  Surgeon: Milford Cage, MD;  Location: [facility];  Service: Urology;  Laterality: Left;   Family History  Problem Relation Age of Onset  . Heart disease Mother   . Hypertension Mother   . Cancer Mother     cervical   . Heart attack Father   . Cancer Father     prostate  . Colon polyps Neg Hx   . Colon cancer Neg Hx    History  Substance Use Topics  . Smoking status: Never Smoker   . Smokeless tobacco: Never Used  . Alcohol Use: No   OB History   Grav Para Term Preterm Abortions TAB SAB Ect Mult Living                 Review of Systems At least 10pt or greater review of systems completed and are negative except where specified in the HPI.  Allergies  Morphine; Aspirin; and Adhesive  Home Medications   Current Outpatient Rx  Name  Route  Sig  Dispense  Refill  . albuterol (PROVENTIL HFA;VENTOLIN HFA) 108 (90 BASE) MCG/ACT inhaler   Inhalation   Inhale 2 puffs into the lungs every 6 (six) hours as needed. For shortness of breath         . amLODipine (NORVASC) 10 MG tablet   Oral   Take 1 tablet (10 mg total) by mouth daily.   30 tablet   11     Dose increase effective 09/13/2012   . atorvastatin (LIPITOR) 40 MG tablet   Oral   Take 40 mg by mouth every morning.         . budesonide-formoterol (SYMBICORT) 160-4.5 MCG/ACT inhaler   Inhalation   Inhale 2 puffs into the lungs 2 (two) times daily as needed (Shortness of Breath).         . diazepam (VALIUM) 5 MG tablet   Oral   Take 5 mg by mouth 3 (three) times daily.         Marland Kitchen esomeprazole (NEXIUM) 40 MG capsule   Oral   Take 1 capsule (40 mg total) by mouth daily before breakfast.   30 capsule   3   . FLUoxetine (PROZAC) 20 MG capsule   Oral   Take 20 mg by mouth every evening.    30 capsule   2   . fluticasone (FLONASE) 50 MCG/ACT nasal spray   Nasal   Place 2 sprays into the nose  daily as needed for rhinitis.          Marland Kitchen gabapentin (NEURONTIN) 800 MG tablet   Oral   Take 800 mg by mouth 4 (four) times daily.         Marland Kitchen  lisinopril (PRINIVIL,ZESTRIL) 5 MG tablet   Oral   Take 5 mg by mouth every evening.          . metFORMIN (GLUCOPHAGE) 1000 MG tablet   Oral   Take 1,000 mg by mouth 2 (two) times daily with a meal.         . montelukast (SINGULAIR) 10 MG tablet   Oral   Take 10 mg by mouth daily.         Marland Kitchen oxyCODONE (OXYCONTIN) 40 MG 12 hr tablet   Oral   Take 1 tablet (40 mg total) by mouth every 12 (twelve) hours.   60 tablet   0   . potassium chloride SA (K-DUR,KLOR-CON) 20 MEQ tablet   Oral   Take 20 mEq by mouth daily with breakfast.          . senna-docusate (SENOKOT S) 8.6-50 MG per tablet   Oral   Take 1 tablet by mouth 2 (two) times daily.   60 tablet   0   . zolpidem (AMBIEN) 10 MG tablet   Oral   Take 10 mg by mouth at bedtime.          . ergocalciferol (VITAMIN D2) 50000 UNITS capsule   Oral   Take 50,000 Units by mouth once a week. Takes on Monday   12 capsule   1   . nitroGLYCERIN (NITROLINGUAL) 0.4 MG/SPRAY spray   Sublingual   Place 1 spray under the tongue every 5 (five) minutes as needed. For chest pain          BP 128/79  Pulse 69  Temp(Src) 97.9 F (36.6 C) (Oral)  Resp 20  Ht 5\' 4"  (1.626 m)  Wt 270 lb (122.471 kg)  BMI 46.32 kg/m2  SpO2 97% Physical Exam  Nursing notes reviewed.  Electronic medical record reviewed. VITAL SIGNS:   Filed Vitals:   11/07/12 2232 11/07/12 2307  BP: 143/96 128/79  Pulse: 72 69  Temp: 97.9 F (36.6 C) 97.9 F (36.6 C)  TempSrc: Oral Oral  Resp: 20 20  Height: 5\' 4"  (1.626 m)   Weight: 270 lb (122.471 kg)   SpO2: 99% 97%   CONSTITUTIONAL: Awake, oriented, appears non-toxic HENT: Atraumatic, normocephalic, oral mucosa pink and moist, airway patent. Nares patent without drainage. External ears normal. EYES: Conjunctiva clear, EOMI, PERRLA NECK: Trachea  midline, non-tender, supple CARDIOVASCULAR: Normal heart rate, Normal rhythm, No murmurs, rubs, gallops PULMONARY/CHEST: Clear to auscultation, no rhonchi, wheezes, or rales. Symmetrical breath sounds. Non-tender. ABDOMINAL: Non-distended, morbidly obese, soft, non-tender - no rebound or guarding.  BS normal. NEUROLOGIC: Facial sensation equal to light touch bilaterally.  Good muscle bulk in the masseter muscle and good lateral movement of the jaw.  Facial expressions equal and good strength with smile/frown and puffed cheeks.  Hearing grossly intact to finger rub test.  Uvula, tongue are midline with no deviation. Symmetrical palate elevation.  Trapezius and SCM muscles are 5/5 strength bilaterally.   DTR: Brachioradialis, biceps, patellar tendon reflexes + bilaterally.  No clonus. Strength: 5/5 strength flexors and extensors in the upper and lower extremities.  Grip strength, finger adduction/abduction 5/5. Sensation: Sensation intact distally to light touch Cerebellar: No dysmetria with finger to nose, rapid alternating hand movements and heels to shin testing. EXTREMITIES: No clubbing, cyanosis, or edema SKIN: Warm, Dry, No erythema, No rash  ED Course  Procedures (including critical care time) Labs Reviewed - No data to display No results found. 1. Migraine headache    Medications  metoCLOPramide (REGLAN) injection 10 mg (10 mg Intravenous Given 11/08/12 0007)  diphenhydrAMINE (BENADRYL) injection 25 mg (25 mg Intravenous Given 11/08/12 0007)  sodium chloride 0.9 % bolus 1,000 mL (0 mLs Intravenous Stopped 11/08/12 0151)  ketorolac (TORADOL) 30 MG/ML injection 30 mg (30 mg Intravenous Given 11/08/12 0007)  haloperidol lactate (HALDOL) injection 2 mg (2 mg Intravenous Given 11/08/12 0041)    MDM  Angela Huq is a 52 y.o. female presents with likely migraine headache, do not suspect meningitis, encephalitis,  Or subarachnoid hemorrhage.  Do not think imaging is indicated at this time,  vital signs are stable, within normal limits with exception of mildly elevated blood pressure, patient is taking hypertensive medicine. Patient was treated with 2 rounds of migraine medicines, she did see the improvements, but wanted to leave the emergency department AGAINST MEDICAL ADVICE. She would not stay for me to discuss return precautions or ramifications of leaving against medical device, I was informed after she left by the nursing staff.  Do not suspect any acute neurologic emergency and this patient, she was advised that she can return to the ER for any reason or worsening symptoms.   Jones Skene, MD 11/08/12 1478

## 2012-11-08 MED ORDER — HALOPERIDOL LACTATE 5 MG/ML IJ SOLN
2.0000 mg | Freq: Once | INTRAMUSCULAR | Status: AC
  Administered 2012-11-08: 2 mg via INTRAVENOUS
  Filled 2012-11-08: qty 1

## 2012-11-08 NOTE — ED Notes (Signed)
Patient states she can tolerate headache at home; states she is ready to leave.  Patient given risks and benefits by RN because patient stated she didn't want to wait on MD to come talk to her.  Patient states she wants to go home.

## 2012-11-13 ENCOUNTER — Encounter: Payer: Self-pay | Admitting: Family Medicine

## 2012-11-13 ENCOUNTER — Ambulatory Visit (INDEPENDENT_AMBULATORY_CARE_PROVIDER_SITE_OTHER): Payer: PRIVATE HEALTH INSURANCE | Admitting: Family Medicine

## 2012-11-13 VITALS — BP 124/78 | HR 82 | Resp 16 | Ht 65.5 in | Wt 278.0 lb

## 2012-11-13 DIAGNOSIS — E119 Type 2 diabetes mellitus without complications: Secondary | ICD-10-CM

## 2012-11-13 DIAGNOSIS — I10 Essential (primary) hypertension: Secondary | ICD-10-CM

## 2012-11-13 DIAGNOSIS — F329 Major depressive disorder, single episode, unspecified: Secondary | ICD-10-CM

## 2012-11-13 DIAGNOSIS — R51 Headache: Secondary | ICD-10-CM

## 2012-11-13 MED ORDER — BUTALBITAL-APAP-CAFFEINE 50-325-40 MG PO TABS: ORAL_TABLET | ORAL | Status: DC

## 2012-11-13 MED ORDER — KETOROLAC TROMETHAMINE 60 MG/2ML IJ SOLN
60.0000 mg | Freq: Once | INTRAMUSCULAR | Status: AC
  Administered 2012-11-13: 60 mg via INTRAMUSCULAR

## 2012-11-13 MED ORDER — PREDNISONE 5 MG PO TABS: 5.0000 mg | ORAL_TABLET | Freq: Two times a day (BID) | ORAL | Status: AC

## 2012-11-13 MED ORDER — METHYLPREDNISOLONE ACETATE 80 MG/ML IJ SUSP
80.0000 mg | Freq: Once | INTRAMUSCULAR | Status: AC
  Administered 2012-11-13: 80 mg via INTRAMUSCULAR

## 2012-11-13 NOTE — Progress Notes (Signed)
  Subjective:    [name], female    DOB: 1960/12/11, 52 y.o.   MRN: [id number]  HPI 2 week h/o severe headache, right sided, from crown of head  To nape to right eye , red and watery No weakness or numbness , no response to medication , has had ED  Visit for same in recent times No aura  Review of Systems See HPI Denies recent fever or chills. Denies sinus pressure, nasal congestion, ear pain or sore throat. Denies chest congestion, productive cough or wheezing. Denies chest pains, palpitations and leg swelling Denies abdominal pain, nausea, vomiting,diarrhea or constipation.   Denies dysuria, frequency, hesitancy or incontinence. Chronic  Joint and back pain, and limitation in mobility. Denies  seizures, numbness, or tingling. Denies uncontrolled  depression, anxiety or insomnia. Denies skin break down or rash.        Objective:   Physical Exam Patient alert and oriented and in no cardiopulmonary distress.Pt in pain  HEENT: No facial asymmetry, EOMI, no sinus tenderness,  oropharynx pink and moist.  Neck supple no adenopathy.No hemmorage or exudaet on eye exam  Chest: Clear to auscultation bilaterally.  CVS: S1, S2 no murmurs, no S3.  ABD: Soft non tender. Bowel sounds normal.  Ext: No edema  MS: Adequate though reduced  ROM spine, shoulders, hips and knees.  Skin: Intact, no ulcerations or rash noted.  Psych: Good eye contact, normal affect. Memory intact not anxious or depressed appearing.  CNS: CN 2-12 intact, power, tone and sensation normal throughout.        Assessment & Plan:

## 2012-11-13 NOTE — Patient Instructions (Addendum)
F/u as before  Toradol 60 mg im and depo medrol 80 mg im in the office  Prednisone for 5 days , start today  Fioricet as needed for uncontrolled headache  You are referred for a brain scan  Chem 7 needed ( for scan)

## 2012-11-13 NOTE — Assessment & Plan Note (Signed)
Uncontrolled cluster headache possibly, but need MRI brain, same ordered  Toradol 60mg   And depo medrol 80 mg IM, followed by fioricet and prednisone

## 2012-11-15 ENCOUNTER — Telehealth: Payer: Self-pay | Admitting: Family Medicine

## 2012-11-15 NOTE — Telephone Encounter (Signed)
Patient is aware 

## 2012-11-17 NOTE — Assessment & Plan Note (Signed)
Controlled, no change in medication  

## 2012-11-17 NOTE — Assessment & Plan Note (Signed)
Controlled, no change in medication Benefittiing from therpay

## 2012-11-19 ENCOUNTER — Ambulatory Visit (HOSPITAL_COMMUNITY): Payer: PRIVATE HEALTH INSURANCE

## 2012-11-20 ENCOUNTER — Telehealth: Payer: Self-pay | Admitting: Family Medicine

## 2012-11-20 NOTE — Telephone Encounter (Signed)
Please adivse

## 2012-11-21 ENCOUNTER — Other Ambulatory Visit: Payer: Self-pay | Admitting: Family Medicine

## 2012-11-21 ENCOUNTER — Other Ambulatory Visit: Payer: Self-pay

## 2012-11-21 DIAGNOSIS — E119 Type 2 diabetes mellitus without complications: Secondary | ICD-10-CM

## 2012-11-21 DIAGNOSIS — R51 Headache: Secondary | ICD-10-CM

## 2012-11-21 DIAGNOSIS — H538 Other visual disturbances: Secondary | ICD-10-CM

## 2012-11-21 MED ORDER — OXYCODONE HCL 40 MG PO TB12: 40.0000 mg | ORAL_TABLET | Freq: Two times a day (BID) | ORAL | Status: DC

## 2012-11-21 NOTE — Telephone Encounter (Signed)
Spoke with pt she is still having ongoing headache, she needs to be seen asap . Also states has partial vision disturbance with the headache temporarily, this has happened in the past, eye exam due next monht, will request sooner appt  She is to have brain scan tomorrow. I have advised return ED visit for uncontrolled headache since I have no additional medication to offer at this time  Pls refer to neurologist who can see her soonest, (hopefully within the next 1 week)  She is expecting a call about this. (nuerology is the most important appt) Also refer to Dr Nile Riggs pls

## 2012-11-22 ENCOUNTER — Ambulatory Visit (HOSPITAL_COMMUNITY): Payer: PRIVATE HEALTH INSURANCE

## 2012-11-22 NOTE — Telephone Encounter (Signed)
Faxed over papers to Dr. Harriett Sine they will call patient with an appointment patient is aware

## 2012-11-25 ENCOUNTER — Other Ambulatory Visit: Payer: Self-pay | Admitting: Family Medicine

## 2012-11-26 ENCOUNTER — Other Ambulatory Visit: Payer: Self-pay | Admitting: Family Medicine

## 2012-11-27 ENCOUNTER — Ambulatory Visit (HOSPITAL_COMMUNITY)
Admission: RE | Admit: 2012-11-27 | Discharge: 2012-11-27 | Disposition: A | Discharge status: Home / Self Care | Payer: PRIVATE HEALTH INSURANCE | Source: Ambulatory Visit | Attending: Family Medicine | Admitting: Family Medicine

## 2012-11-27 ENCOUNTER — Encounter (HOSPITAL_COMMUNITY): Payer: Self-pay

## 2012-11-27 DIAGNOSIS — H538 Other visual disturbances: Secondary | ICD-10-CM | POA: Insufficient documentation

## 2012-11-27 DIAGNOSIS — R51 Headache: Secondary | ICD-10-CM | POA: Insufficient documentation

## 2012-11-29 ENCOUNTER — Other Ambulatory Visit: Payer: Self-pay | Admitting: Family Medicine

## 2012-12-04 ENCOUNTER — Other Ambulatory Visit: Payer: Self-pay

## 2012-12-18 ENCOUNTER — Institutional Professional Consult (permissible substitution): Payer: PRIVATE HEALTH INSURANCE | Admitting: Pulmonary Disease

## 2012-12-20 ENCOUNTER — Other Ambulatory Visit: Payer: Self-pay

## 2012-12-20 ENCOUNTER — Encounter: Payer: Self-pay | Admitting: Pulmonary Disease

## 2012-12-20 MED ORDER — OXYCODONE HCL 40 MG PO TB12: 40.0000 mg | ORAL_TABLET | Freq: Two times a day (BID) | ORAL | Status: DC

## 2012-12-24 ENCOUNTER — Other Ambulatory Visit: Payer: Self-pay | Admitting: Neurology

## 2012-12-24 DIAGNOSIS — H547 Unspecified visual loss: Secondary | ICD-10-CM

## 2012-12-26 ENCOUNTER — Ambulatory Visit (HOSPITAL_COMMUNITY): Payer: PRIVATE HEALTH INSURANCE | Attending: Neurology

## 2012-12-31 ENCOUNTER — Other Ambulatory Visit: Payer: Self-pay | Admitting: Family Medicine

## 2013-01-17 ENCOUNTER — Other Ambulatory Visit: Payer: Self-pay | Admitting: Family Medicine

## 2013-01-22 ENCOUNTER — Other Ambulatory Visit: Payer: Self-pay | Admitting: Family Medicine

## 2013-01-23 ENCOUNTER — Other Ambulatory Visit: Payer: Self-pay

## 2013-01-23 MED ORDER — OXYCODONE HCL 40 MG PO TB12
40.0000 mg | ORAL_TABLET | Freq: Two times a day (BID) | ORAL | Status: DC
Start: 2013-01-23 — End: 2013-04-11

## 2013-02-03 ENCOUNTER — Telehealth: Payer: Self-pay | Admitting: Family Medicine

## 2013-02-04 ENCOUNTER — Other Ambulatory Visit: Payer: Self-pay | Admitting: Family Medicine

## 2013-02-04 MED ORDER — PREDNISONE (PAK) 5 MG PO TABS: 5.0000 mg | ORAL_TABLET | ORAL | Status: DC

## 2013-02-04 MED ORDER — PROMETHAZINE-DM 6.25-15 MG/5ML PO SYRP: ORAL_SOLUTION | ORAL | Status: DC

## 2013-02-04 NOTE — Telephone Encounter (Signed)
This sounds like an asthma /allergy flare, I am sending in prednisone dose pack and cough suppressant syrup, offer her depo medrol 80mg  im as nurse Ov also pls I fever , chills , green mucus , needs Ov, if worsens needs OV, pls let her  know

## 2013-02-04 NOTE — Telephone Encounter (Signed)
Toni Amend spoke with patient. See duplicate message

## 2013-02-04 NOTE — Telephone Encounter (Signed)
Patient states that her cough is so forceful that it is causing headaches.  She is congested with clear mucous.  Pharmacy is [facility]

## 2013-02-04 NOTE — Telephone Encounter (Signed)
Patient aware.

## 2013-02-13 ENCOUNTER — Other Ambulatory Visit: Payer: Self-pay | Admitting: Family Medicine

## 2013-02-13 ENCOUNTER — Telehealth: Payer: Self-pay | Admitting: Family Medicine

## 2013-02-13 DIAGNOSIS — J45909 Unspecified asthma, uncomplicated: Secondary | ICD-10-CM

## 2013-02-13 DIAGNOSIS — R05 Cough: Secondary | ICD-10-CM

## 2013-02-13 NOTE — Telephone Encounter (Signed)
Referral has been entered.

## 2013-02-18 ENCOUNTER — Encounter: Payer: Self-pay | Admitting: Internal Medicine

## 2013-02-18 ENCOUNTER — Ambulatory Visit (INDEPENDENT_AMBULATORY_CARE_PROVIDER_SITE_OTHER): Payer: PRIVATE HEALTH INSURANCE | Admitting: Internal Medicine

## 2013-02-18 VITALS — BP 120/80 | HR 75 | Temp 97.9°F | Ht 64.0 in | Wt 287.0 lb

## 2013-02-18 DIAGNOSIS — R05 Cough: Secondary | ICD-10-CM | POA: Insufficient documentation

## 2013-02-18 DIAGNOSIS — I10 Essential (primary) hypertension: Secondary | ICD-10-CM

## 2013-02-18 DIAGNOSIS — J45909 Unspecified asthma, uncomplicated: Secondary | ICD-10-CM

## 2013-02-18 DIAGNOSIS — R058 Other specified cough: Secondary | ICD-10-CM | POA: Insufficient documentation

## 2013-02-18 MED ORDER — TELMISARTAN 80 MG PO TABS: ORAL_TABLET | ORAL | Status: DC

## 2013-02-18 MED ORDER — BENZONATATE 200 MG PO CAPS: ORAL_CAPSULE | ORAL | Status: DC

## 2013-02-18 MED ORDER — FAMOTIDINE 20 MG PO TABS: ORAL_TABLET | ORAL | Status: DC

## 2013-02-18 MED ORDER — TRAMADOL HCL 50 MG PO TABS: ORAL_TABLET | ORAL | Status: DC

## 2013-02-18 MED ORDER — METHYLPREDNISOLONE ACETATE 80 MG/ML IJ SUSP
120.0000 mg | Freq: Once | INTRAMUSCULAR | Status: AC
  Administered 2013-02-18: 120 mg via INTRAMUSCULAR

## 2013-02-18 NOTE — Progress Notes (Signed)
Subjective:    [name], female    DOB: Mar 14, 1961  MRN: [id number]  HPI  75 yobf never smoker good ex to as child then in 1987 when moved [place] new job in [employer] with onset wheezing eval in [place] dx as asthma and after stopped working there did fine s need for inhalers  and continued to do well  until 06/2012 with onset of cough referred by Dr Berenice Primas 02/18/2013 to pulmonary clinic for cough eval.  02/18/2013 1st [facility] office visit/ Yoshua Geisinger while on ACEi cc abrupt onset p ET in 06/2012  For shoulder surgery  and coughing daily since with prod of min clear mucus 24/7.Marland Kitchen   No vomit but urinates.  Worse when lie down. Sob mostly with coughing, not sob otherwise. Already tried singulair and cough meds, no better with symbicort either   No obvious day to day or daytime variabilty or assoc  cp or chest tightness, subjective wheeze overt sinus symptoms. No unusual exp hx or h/o childhood pna/ asthma or knowledge of premature birth.  Sleeping ok without nocturnal  or early am exacerbation  of respiratory  c/o's or need for noct saba. Also denies any obvious fluctuation of symptoms with weather or environmental changes or other aggravating or alleviating factors except as outlined above   Current Medications, Allergies, Complete Past Medical History, Past Surgical History, Family History, and Social History were reviewed in [employer] record.  ROS  The following are not active complaints unless bolded sore throat, dysphagia, dental problems, itching, sneezing,  nasal congestion or excess/ purulent secretions, ear ache,   fever, chills, sweats, unintended wt loss, pleuritic or exertional cp, hemoptysis,  orthopnea pnd or leg swelling, presyncope, palpitations, heartburn, abdominal pain, anorexia, nausea, vomiting, diarrhea  or change in bowel or urinary habits, change in stools or urine, dysuria,hematuria,  rash, arthralgias, visual complaints, headache,  numbness weakness or ataxia or problems with walking or coordination,  change in mood/affect or memory.         Review of Systems  Constitutional: Negative for fever, chills and unexpected weight change.  HENT: Negative for congestion, dental problem, ear pain, nosebleeds, postnasal drip, rhinorrhea, sinus pressure, sneezing, sore throat, trouble swallowing and voice change.   Eyes: Negative for visual disturbance.  Respiratory: Positive for cough and shortness of breath. Negative for choking.   Cardiovascular: Negative for chest pain and leg swelling.  Gastrointestinal: Negative for vomiting, abdominal pain and diarrhea.  Genitourinary: Negative for difficulty urinating.       Heartburn   Musculoskeletal: Positive for arthralgias.  Skin: Negative for rash.  Neurological: Positive for headaches. Negative for tremors and syncope.  Hematological: Does not bruise/bleed easily.       Objective:   Physical Exam  Obese bf with classic voice fatigue and harsh dry sounding upper airway cough   Wt Readings from Last 3 Encounters:  02/18/13 287 lb (130.182 kg)  11/13/12 278 lb (126.1 kg)  11/07/12 270 lb (122.471 kg)     HEENT: nl dentition, turbinates, and orophanx. Nl external ear canals without cough reflex   NECK :  without JVD/Nodes/TM/ nl carotid upstrokes bilaterally   LUNGS: no acc muscle use, clear to A and P bilaterally without cough on insp or exp maneuvers - prominent pseudowheeze   CV:  RRR  no s3 or murmur or increase in P2, no edema   ABD:  soft and nontender with nl excursion in the supine position. No bruits or organomegaly,  bowel sounds nl  MS:  warm without deformities, calf tenderness, cyanosis or clubbing  SKIN: warm and dry without lesions    NEURO:  alert, approp, no deficits          Assessment & Plan:

## 2013-02-18 NOTE — Assessment & Plan Note (Signed)
Not clear how much true asthma she has but should be ok for now on singulair and prn saba.    Each maintenance medication was reviewed in detail including most importantly the difference between maintenance and as needed and under what circumstances the prns are to be used.  Please see instructions for details which were reviewed in writing and the patient given a copy.

## 2013-02-18 NOTE — Assessment & Plan Note (Signed)
ACE inhibitors are problematic in  pts with airway complaints because  even experienced pulmonologists can't always distinguish ace effects from copd/asthma.  By themselves they don't actually cause a problem, much like oxygen can't by itself start a fire, but they certainly serve as a powerful catalyst or enhancer for any "fire"  or inflammatory process in the upper airway, be it caused by an ET  tube or more commonly reflux (especially in the obese or pts with known GERD or who are on biphoshonates).    In the era of ARB near equivalency until we have a better handle on the reversibility of the airway problem, it just makes sense to avoid ACEI  entirely in the short run and then decide later, having established a level of airway control using a reasonable limited regimen, whether to add back ace but even then being very careful to observe the pt for worsening airway control and number of meds used/ needed to control symptoms.    For now try off lisinopril and on micardis 80 one half tab daily samples x 4 weeks then regroup

## 2013-02-18 NOTE — Assessment & Plan Note (Addendum)
The most common causes of chronic cough in immunocompetent adults include the following: upper airway cough syndrome (UACS), previously referred to as postnasal drip syndrome (PNDS), which is caused by variety of rhinosinus conditions; (2) asthma; (3) GERD; (4) chronic bronchitis from cigarette smoking or other inhaled environmental irritants; (5) nonasthmatic eosinophilic bronchitis; and (6) bronchiectasis.   These conditions, singly or in combination, have accounted for up to 94% of the causes of chronic cough in prospective studies.   Other conditions have constituted no >6% of the causes in prospective studies These have included bronchogenic carcinoma, chronic interstitial pneumonia, sarcoidosis, left ventricular failure, ACEI-induced cough, and aspiration from a condition associated with pharyngeal dysfunction.    Chronic cough is often simultaneously caused by more than one condition. A single cause has been found from 38 to 82% of the time, multiple causes from 18 to 62%. Multiply caused cough has been the result of three diseases up to 42% of the time.     Most likely this is  Classic Upper airway cough syndrome, so named because it's frequently impossible to sort out how much is  CR/sinusitis with freq throat clearing (which can be related to primary GERD)   vs  causing  secondary (" extra esophageal")  GERD from wide swings in gastric pressure that occur with throat clearing, often  promoting self use of mint and menthol lozenges that reduce the lower esophageal sphincter tone and exacerbate the problem further in a cyclical fashion.   These are the same pts (now being labeled as having "irritable larynx syndrome" by some cough centers) who not infrequently have a history of having failed to tolerate ace inhibitors,  dry powder inhalers(or any form of  ICS)  or biphosphonates or report having atypical reflux symptoms that don't respond to standard doses of PPI , and are easily confused as  having aecopd or asthma flares by even experienced allergists/ pulmonologists.   For now try off ACEi, on max gerd / cyclical cough regimen and off ics and then regroup in 4 weeks

## 2013-02-18 NOTE — Patient Instructions (Addendum)
Stop symbicort  For breathing:  Only use your albuterol (proaire) as a rescue medication to be used if you can't catch your breath by resting or doing a relaxed purse lip breathing pattern.  - The less you use it, the better it will work when you need it. - Ok to use up to every 4 hours if you must but call for immediate appointment if use goes up over your usual need - Don't leave home without it !!  (think of it like your spare tire for your car)   Tessalon pearls 200 mg every 4 hours as needed for cough and supplement with tramdol up 2 every 4 hours  Nexium Take 30-60 min before first meal of the day and Pepcid 20 mg one at bedtime  Stop lisinopril and micardis 80 mg take one half daily  GERD (REFLUX)  is an extremely common cause of respiratory symptoms, many times with no significant heartburn at all.    It can be treated with medication, but also with lifestyle changes including avoidance of late meals, excessive alcohol, smoking cessation, and avoid fatty foods, chocolate, peppermint, colas, red wine, and acidic juices such as orange juice.  NO MINT OR MENTHOL PRODUCTS SO NO COUGH DROPS  USE SUGARLESS CANDY INSTEAD (jolley ranchers or Stover's)  NO OIL BASED VITAMINS - use powdered substitutes.    Please schedule a follow up office visit in 4 weeks, sooner if needed

## 2013-02-20 ENCOUNTER — Other Ambulatory Visit: Payer: Self-pay

## 2013-02-20 MED ORDER — OXYCODONE HCL ER 40 MG PO T12A: 40.0000 mg | EXTENDED_RELEASE_TABLET | Freq: Two times a day (BID) | ORAL | Status: DC

## 2013-02-23 ENCOUNTER — Other Ambulatory Visit: Payer: Self-pay | Admitting: Family Medicine

## 2013-02-24 ENCOUNTER — Telehealth: Payer: Self-pay | Admitting: Internal Medicine

## 2013-02-24 ENCOUNTER — Ambulatory Visit: Payer: Medicare Other | Admitting: Family Medicine

## 2013-02-24 NOTE — Telephone Encounter (Signed)
Ov with Tammy NP asap with all meds in hand including otcs

## 2013-02-24 NOTE — Telephone Encounter (Signed)
[id number].Jennifer Castillo, CMA  

## 2013-02-24 NOTE — Telephone Encounter (Signed)
appt set for 02-28-13. Carron Curie, CMA

## 2013-02-24 NOTE — Telephone Encounter (Signed)
Patient Instructions    Stop symbicort   For breathing:  Only use your albuterol (proaire) as a rescue medication to be used if you can't catch your breath by resting or doing a relaxed purse lip breathing pattern.   - The less you use it, the better it will work when you need it. - Ok to use up to every 4 hours if you must but call for immediate appointment if use goes up over your usual need - Don't leave home without it !!  (think of it like your spare tire for your car)  Tessalon pearls 200 mg every 4 hours as needed for cough and supplement with tramdol up 2 every 4 hours Nexium Take 30-60 min before first meal of the day and Pepcid 20 mg one at bedtime Stop lisinopril and micardis 80 mg take one half daily GERD (REFLUX)  is an extremely common cause of respiratory symptoms, many times with no significant heartburn at all.  It can be treated with medication, but also with lifestyle changes including avoidance of late meals, excessive alcohol, smoking cessation, and avoid fatty foods, chocolate, peppermint, colas, red wine, and acidic juices such as orange juice.   NO MINT OR MENTHOL PRODUCTS SO NO COUGH DROPS  USE SUGARLESS CANDY INSTEAD (jolley ranchers or Stover's)   NO OIL BASED VITAMINS - use powdered substitutes. Please schedule a follow up office visit in 4 weeks, sooner if needed  ---  I spoke with pt. She reports her cough is not any better. She is taking the tessalon pearls 3-4 times a day and the tramadol 6 daily. When she is lying down her cough is worse. She has been coughing her "head off". She has done everything MW recommend. Please advise thanks  Allergies  Allergen Reactions  . Morphine Other (See Comments)    Reaction with esophagus, unable to swallow.   . Aspirin Other (See Comments)    Reaction with esophagus, unable to swallow.   . Adhesive [Tape] Rash

## 2013-02-24 NOTE — Telephone Encounter (Signed)
ATC pt line fast busy signal x 4 wcb

## 2013-02-28 ENCOUNTER — Ambulatory Visit: Payer: Self-pay | Admitting: Adult Health

## 2013-02-28 ENCOUNTER — Ambulatory Visit: Payer: Medicare Other | Admitting: Family Medicine

## 2013-03-03 ENCOUNTER — Encounter: Payer: Self-pay | Admitting: Adult Health

## 2013-03-06 ENCOUNTER — Other Ambulatory Visit: Payer: Self-pay

## 2013-03-06 ENCOUNTER — Encounter: Payer: Self-pay | Admitting: Adult Health

## 2013-03-17 ENCOUNTER — Ambulatory Visit: Payer: Medicare Other | Admitting: Family Medicine

## 2013-03-18 ENCOUNTER — Ambulatory Visit: Payer: Self-pay | Admitting: Internal Medicine

## 2013-03-19 ENCOUNTER — Ambulatory Visit: Payer: Self-pay | Admitting: Internal Medicine

## 2013-03-21 ENCOUNTER — Other Ambulatory Visit: Payer: Self-pay

## 2013-03-21 MED ORDER — OXYCODONE HCL ER 40 MG PO T12A
40.0000 mg | EXTENDED_RELEASE_TABLET | Freq: Two times a day (BID) | ORAL | Status: DC
Start: 2013-03-21 — End: 2013-04-01

## 2013-03-30 ENCOUNTER — Other Ambulatory Visit: Payer: Self-pay | Admitting: Family Medicine

## 2013-04-01 ENCOUNTER — Ambulatory Visit (INDEPENDENT_AMBULATORY_CARE_PROVIDER_SITE_OTHER): Payer: PRIVATE HEALTH INSURANCE | Admitting: Family Medicine

## 2013-04-01 ENCOUNTER — Encounter: Payer: Self-pay | Admitting: Family Medicine

## 2013-04-01 VITALS — BP 156/90 | HR 82 | Resp 18 | Ht 65.5 in | Wt 291.1 lb

## 2013-04-01 DIAGNOSIS — I10 Essential (primary) hypertension: Secondary | ICD-10-CM

## 2013-04-01 DIAGNOSIS — F3289 Other specified depressive episodes: Secondary | ICD-10-CM

## 2013-04-01 DIAGNOSIS — E119 Type 2 diabetes mellitus without complications: Secondary | ICD-10-CM

## 2013-04-01 DIAGNOSIS — E785 Hyperlipidemia, unspecified: Secondary | ICD-10-CM

## 2013-04-01 DIAGNOSIS — E669 Obesity, unspecified: Secondary | ICD-10-CM

## 2013-04-01 DIAGNOSIS — F329 Major depressive disorder, single episode, unspecified: Secondary | ICD-10-CM

## 2013-04-01 DIAGNOSIS — M549 Dorsalgia, unspecified: Secondary | ICD-10-CM

## 2013-04-01 DIAGNOSIS — N200 Calculus of kidney: Secondary | ICD-10-CM

## 2013-04-01 DIAGNOSIS — E559 Vitamin D deficiency, unspecified: Secondary | ICD-10-CM

## 2013-04-01 MED ORDER — CLONIDINE HCL 0.3 MG PO TABS: ORAL_TABLET | ORAL | Status: DC

## 2013-04-01 MED ORDER — FLUOXETINE HCL 40 MG PO CAPS: 40.0000 mg | ORAL_CAPSULE | Freq: Every day | ORAL | Status: DC

## 2013-04-01 NOTE — Progress Notes (Signed)
   Subjective:    [name], female    DOB: 1960-11-27, 52 y.o.   MRN: [id number]  HPI The PT is here for follow up and re-evaluation of chronic medical conditions, medication management and review of any available recent lab and radiology data.  Preventive health is updated, specifically  Cancer screening and Immunization.   Questions or concerns regarding consultations or procedures which the PT has had in the interim are  Addressed.Needs to establish with new urologist due to multiple missed appts The PT denies any adverse reactions to current medications since the last visit.  There are no new concerns.  Deterioration in relationship with her only child is her main stressor      Review of Systems See HPI Denies recent fever or chills. Denies sinus pressure, nasal congestion, ear pain or sore throat. Denies chest congestion, productive cough or wheezing. Denies chest pains, palpitations and leg swelling Denies abdominal pain, nausea, vomiting,diarrhea or constipation.   Denies dysuria, frequency, hesitancy or incontinence. Chronic back pain unchanged Denies headaches, seizures, numbness, or tingling. C/o depression, anxiety and  Insomnia.Takes medication for all 3 and needs to return to therapy. Not suicidal or homicidal,  Denies skin break down or rash.        Objective:   Physical Exam  Patient alert and oriented and in no cardiopulmonary distress.  HEENT: No facial asymmetry, EOMI, no sinus tenderness,  oropharynx pink and moist.  Neck supple no adenopathy.  Chest: Clear to auscultation bilaterally.  CVS: S1, S2 no murmurs, no S3.  ABD: Soft non tender. Bowel sounds normal.  Ext: No edema  MS: decreased  ROM spine, shoulders, hips and knees.  Skin: Intact, no ulcerations or rash noted.  Psych: Good eye contact, normal affect. Memory intact not anxious or depressed appearing.  CNS: CN 2-12 intact, power, tone and sensation normal  throughout.       Assessment & Plan:

## 2013-04-01 NOTE — Patient Instructions (Addendum)
F/u in 6 weeks, please call if you need me before   Lab on 12/04, not tomorrow  Please, microalb, fasting ;ipid, cmp and EGFr, HBA1C, vit D, TSH    New additional medication clonidine 0.3mg  one at bedtime   Increased dose of fluoxetine to 40mg   Daily, with a score today of 11 and therapy you should feel much better

## 2013-04-10 ENCOUNTER — Encounter (HOSPITAL_COMMUNITY): Payer: Self-pay | Admitting: Emergency Medicine

## 2013-04-10 ENCOUNTER — Emergency Department (HOSPITAL_COMMUNITY)
Admission: EM | Admit: 2013-04-10 | Discharge: 2013-04-10 | Disposition: A | Discharge status: Home / Self Care | Payer: PRIVATE HEALTH INSURANCE | Attending: Emergency Medicine | Admitting: Emergency Medicine

## 2013-04-10 ENCOUNTER — Emergency Department (HOSPITAL_COMMUNITY): Payer: PRIVATE HEALTH INSURANCE

## 2013-04-10 DIAGNOSIS — Z87442 Personal history of urinary calculi: Secondary | ICD-10-CM | POA: Insufficient documentation

## 2013-04-10 DIAGNOSIS — Y939 Activity, unspecified: Secondary | ICD-10-CM | POA: Insufficient documentation

## 2013-04-10 DIAGNOSIS — S335XXA Sprain of ligaments of lumbar spine, initial encounter: Secondary | ICD-10-CM | POA: Insufficient documentation

## 2013-04-10 DIAGNOSIS — S139XXA Sprain of joints and ligaments of unspecified parts of neck, initial encounter: Secondary | ICD-10-CM | POA: Insufficient documentation

## 2013-04-10 DIAGNOSIS — Z8619 Personal history of other infectious and parasitic diseases: Secondary | ICD-10-CM | POA: Insufficient documentation

## 2013-04-10 DIAGNOSIS — N189 Chronic kidney disease, unspecified: Secondary | ICD-10-CM | POA: Insufficient documentation

## 2013-04-10 DIAGNOSIS — Z8739 Personal history of other diseases of the musculoskeletal system and connective tissue: Secondary | ICD-10-CM | POA: Insufficient documentation

## 2013-04-10 DIAGNOSIS — I252 Old myocardial infarction: Secondary | ICD-10-CM | POA: Insufficient documentation

## 2013-04-10 DIAGNOSIS — G8929 Other chronic pain: Secondary | ICD-10-CM | POA: Insufficient documentation

## 2013-04-10 DIAGNOSIS — K219 Gastro-esophageal reflux disease without esophagitis: Secondary | ICD-10-CM | POA: Insufficient documentation

## 2013-04-10 DIAGNOSIS — S161XXA Strain of muscle, fascia and tendon at neck level, initial encounter: Secondary | ICD-10-CM

## 2013-04-10 DIAGNOSIS — E119 Type 2 diabetes mellitus without complications: Secondary | ICD-10-CM | POA: Insufficient documentation

## 2013-04-10 DIAGNOSIS — E7800 Pure hypercholesterolemia, unspecified: Secondary | ICD-10-CM | POA: Insufficient documentation

## 2013-04-10 DIAGNOSIS — F411 Generalized anxiety disorder: Secondary | ICD-10-CM | POA: Insufficient documentation

## 2013-04-10 DIAGNOSIS — Z95818 Presence of other cardiac implants and grafts: Secondary | ICD-10-CM | POA: Insufficient documentation

## 2013-04-10 DIAGNOSIS — W010XXA Fall on same level from slipping, tripping and stumbling without subsequent striking against object, initial encounter: Secondary | ICD-10-CM | POA: Insufficient documentation

## 2013-04-10 DIAGNOSIS — Y929 Unspecified place or not applicable: Secondary | ICD-10-CM | POA: Insufficient documentation

## 2013-04-10 DIAGNOSIS — S39012A Strain of muscle, fascia and tendon of lower back, initial encounter: Secondary | ICD-10-CM

## 2013-04-10 DIAGNOSIS — F329 Major depressive disorder, single episode, unspecified: Secondary | ICD-10-CM | POA: Insufficient documentation

## 2013-04-10 DIAGNOSIS — J45909 Unspecified asthma, uncomplicated: Secondary | ICD-10-CM | POA: Insufficient documentation

## 2013-04-10 DIAGNOSIS — I129 Hypertensive chronic kidney disease with stage 1 through stage 4 chronic kidney disease, or unspecified chronic kidney disease: Secondary | ICD-10-CM | POA: Insufficient documentation

## 2013-04-10 DIAGNOSIS — F3289 Other specified depressive episodes: Secondary | ICD-10-CM | POA: Insufficient documentation

## 2013-04-10 DIAGNOSIS — Z79899 Other long term (current) drug therapy: Secondary | ICD-10-CM | POA: Insufficient documentation

## 2013-04-10 MED ORDER — ONDANSETRON 4 MG PO TBDP
4.0000 mg | ORAL_TABLET | Freq: Once | ORAL | Status: AC
  Administered 2013-04-10: 4 mg via ORAL

## 2013-04-10 MED ORDER — DIAZEPAM 5 MG/ML IJ SOLN
5.0000 mg | Freq: Once | INTRAMUSCULAR | Status: AC
  Administered 2013-04-10: 5 mg via INTRAMUSCULAR
  Filled 2013-04-10: qty 2

## 2013-04-10 MED ORDER — HYDROMORPHONE HCL PF 1 MG/ML IJ SOLN
1.0000 mg | Freq: Once | INTRAMUSCULAR | Status: AC
  Administered 2013-04-10: 1 mg via INTRAMUSCULAR
  Filled 2013-04-10: qty 1

## 2013-04-10 MED ORDER — ONDANSETRON 4 MG PO TBDP
ORAL_TABLET | ORAL | Status: AC
  Filled 2013-04-10: qty 1

## 2013-04-10 MED ORDER — CYCLOBENZAPRINE HCL 10 MG PO TABS: 10.0000 mg | ORAL_TABLET | Freq: Two times a day (BID) | ORAL | Status: DC | PRN

## 2013-04-10 NOTE — ED Notes (Signed)
Patient states she slid yesterday out of her front door onto her porch.  Patient c/o back pain and left sided pain all over.  Patient states she took her Oxycontin and Ultram today without relief.  Patient is A&O; denies LOC.

## 2013-04-10 NOTE — ED Provider Notes (Signed)
CSN: [id number]     Arrival date & time 04/10/13  1907 History   First MD Initiated Contact with Patient 04/10/13 2018     Chief Complaint  Patient presents with  . Fall  . Back Pain   (Consider location/radiation/quality/duration/timing/severity/associated sxs/prior Treatment) HPI Comments: Patient with a history of chronic lower back pain presents after slipping on the ice on her porch yesterday morning.  She states that her left foot slid out from under her and she landed on the hard porch on her left side.  She has been trying her oxycontin at home and ultram without relief.  She reports history of lumbar fussion in the past.  She denies weakness, numbness, tingling, loss of control of bowels or bladder.  Reports pain worse with movement.  Patient is a 52 y.o. female presenting with fall and back pain. The history is provided by the patient. No language interpreter was used.  Fall This is a new problem. The current episode started yesterday. The problem occurs constantly. The problem has been unchanged. Associated symptoms include arthralgias and myalgias. Pertinent negatives include no abdominal pain, chest pain, congestion, coughing, diaphoresis, fever, joint swelling, nausea, neck pain, numbness, rash, swollen glands, urinary symptoms, vertigo, vomiting or weakness. The symptoms are aggravated by bending, walking and twisting. She has tried oral narcotics for the symptoms. The treatment provided no relief.  Back Pain Associated symptoms: no abdominal pain, no chest pain, no fever, no numbness and no weakness     Past Medical History  Diagnosis Date  . Low back pain   . Helicobacter pylori gastritis 2008  . Severe obesity (BMI >= 40) NOV 2008 270 LBS  . Hypokalemia   . GERD (gastroesophageal reflux disease)   . H/O hiatal hernia   . Anxiety   . Depression   . Asthma     chest  x ray  12/12 Epic  . Hypertension     cath  8/21 2008 EPIC, eccho 06/14/10 EPIC- EF 60-65%  .  Hypercholesterolemia   . Neuromuscular disorder     DDD   with spasms/  diabetic neuropathy  . Dysrhythmia     occ flutter per patient history  . Sleep apnea     STOP BANG SCORE 6no cpap used  . Headache(784.0)     migraines  . Arthritis     all over  . DM type 2 (diabetes mellitus, type 2) 2004  . Chronic kidney disease     stones  . Renal stones 07/25/2012    Multiple episodes of renal stones, probably 5th since approx 2004  . Myocardial infarction 1994     no  pain since 2008/ stress test  10/15/2006  . Hyperthyroidism     s/p radiation   Past Surgical History  Procedure Laterality Date  . Cholecystectomy  1996  . Left knee arthroscopic surgery  1999  . Left salphingectomy secondary to ectopic pregnancy  1991  . Partial hysterectomy  1991  . Knee arthroscopy  10/04/2010,     right knee arthroscopy, dr Thomasena Edis  . Upper gastrointestinal endoscopy  2008 abd pain    H. pylori gastritis  . Upper gastrointestinal endoscopy  1996 [facility] [place]    PUD  . Colonoscopy  2000 [facility]    INT HEMORRHOIDS/FISSURE  . Colonoscopy  2003 [facility], CHANGE IN BOWEL HABITS    INT HEMORRHOIDS  . Colonoscopy  2006 [facility]    INT HEMORRHOIDS  . Colonoscopy  2007 [facility]    INT  HEMORRHOIDS  . Esophagogastroduodenoscopy  2008    WUJ:WJXBJ hiatal hernia./Normal esophagus without evidence of Barrett's mass, stricture, erosion or ulceration./Normal duodenal bulb and second portion of the duodenum./Diffuse erythema in the body and the antrum with occasional erosion.  Biopsies obtained via cold forceps to evaluate for H. pylori gastritis  . Back surgery  1993  . Back surgery  1994  . Back surgery  2002  . Back surgery  2011    [facility]   . Cystoscopy/retrograde/ureteroscopy  12/04/2011    Procedure: CYSTOSCOPY/RETROGRADE/URETEROSCOPY;  Surgeon: Milford Cage, MD;  Location: [facility];  Service: Urology;  Laterality: Right;  . Cardiac catheterization  2008  . Abdominal hysterectomy    . Cystoscopy with  stent placement Left 07/25/2012    Procedure: CYSTOSCOPY, left retograde pyelogram WITH left ureteral  STENT PLACEMENT;  Surgeon: Garnett Farm, MD;  Location: [facility];  Service: Urology;  Laterality: Left;  . Fracture surgery  1999    right clavicle  . Holmium laser application Left 09/03/2012    Procedure: HOLMIUM LASER APPLICATION;  Surgeon: Milford Cage, MD;  Location: [facility];  Service: Urology;  Laterality: Left;  . Cystoscopy/retrograde/ureteroscopy/stone extraction with basket Left 09/03/2012    Procedure: CYSTOSCOPY/RETROGRADE pyelogram/digital URETEROSCOPY/STONE EXTRACTION WITH BASKET, left stent removal;  Surgeon: Milford Cage, MD;  Location: [facility];  Service: Urology;  Laterality: Left;   Family History  Problem Relation Age of Onset  . Heart disease Mother   . Hypertension Mother   . Cancer Mother     cervical   . Heart attack Father   . Cancer Father     prostate  . Colon polyps Neg Hx   . Colon cancer Neg Hx    History  Substance Use Topics  . Smoking status: Never Smoker   . Smokeless tobacco: Never Used  . Alcohol Use: No   OB History   Grav Para Term Preterm Abortions TAB SAB Ect Mult Living                 Review of Systems  Constitutional: Negative for fever and diaphoresis.  HENT: Negative for congestion.   Respiratory: Negative for cough.   Cardiovascular: Negative for chest pain.  Gastrointestinal: Negative for nausea, vomiting and abdominal pain.  Musculoskeletal: Positive for arthralgias, back pain and myalgias. Negative for joint swelling and neck pain.  Skin: Negative for rash.  Neurological: Negative for vertigo, weakness and numbness.  All other systems reviewed and are negative.    Allergies  Morphine; Ace inhibitors; Aspirin; Losartan; and Adhesive  Home Medications   Current Outpatient Rx  Name  Route  Sig  Dispense  Refill  . albuterol (PROAIR HFA) 108 (90 BASE) MCG/ACT inhaler   Inhalation   Inhale 2 puffs into the  lungs every 6 (six) hours as needed for wheezing or shortness of breath.         Marland Kitchen amLODipine (NORVASC) 10 MG tablet   Oral   Take 1 tablet (10 mg total) by mouth daily.   30 tablet   11     Dose increase effective 09/13/2012   . atorvastatin (LIPITOR) 40 MG tablet   Oral   Take 40 mg by mouth every morning.         . cloNIDine (CATAPRES) 0.3 MG tablet      One tablet at 9 pm every night for high blood pressure   30 tablet   4   . diazepam (VALIUM) 5 MG tablet  Oral   Take 5 mg by mouth 3 (three) times daily.         Marland Kitchen esomeprazole (NEXIUM) 40 MG capsule   Oral   Take 40 mg by mouth daily.         Marland Kitchen FLUoxetine (PROZAC) 40 MG capsule   Oral   Take 1 capsule (40 mg total) by mouth daily.   30 capsule   3     Dose increase effective 04/01/2013   . gabapentin (NEURONTIN) 800 MG tablet   Oral   Take 800 mg by mouth 4 (four) times daily.         . metFORMIN (GLUCOPHAGE) 1000 MG tablet   Oral   Take 1,000 mg by mouth 2 (two) times daily with a meal.         . oxyCODONE (OXYCONTIN) 40 MG 12 hr tablet   Oral   Take 1 tablet (40 mg total) by mouth every 12 (twelve) hours.   60 tablet   0   . potassium chloride SA (K-DUR,KLOR-CON) 20 MEQ tablet   Oral   Take 20 mEq by mouth daily with breakfast.          . senna-docusate (SENOKOT S) 8.6-50 MG per tablet   Oral   Take 1 tablet by mouth 2 (two) times daily.   60 tablet   0   . traMADol (ULTRAM) 50 MG tablet   Oral   Take 50 mg by mouth every 6 (six) hours as needed. pain         . zolpidem (AMBIEN) 10 MG tablet   Oral   Take 10 mg by mouth at bedtime.          . cyclobenzaprine (FLEXERIL) 10 MG tablet   Oral   Take 1 tablet (10 mg total) by mouth 2 (two) times daily as needed for muscle spasms.   20 tablet   0   . ergocalciferol (VITAMIN D2) 50000 UNITS capsule   Oral   Take 50,000 Units by mouth once a week. Takes on Monday   12 capsule   1   . nitroGLYCERIN (NITROLINGUAL) 0.4  MG/SPRAY spray   Sublingual   Place 1 spray under the tongue every 5 (five) minutes as needed. For chest pain          BP 142/75  Pulse 75  Temp(Src) 97.7 F (36.5 C) (Oral)  Resp 18  Ht 5\' 2"  (1.575 m)  Wt 289 lb (131.09 kg)  BMI 52.85 kg/m2  SpO2 100% Physical Exam  Nursing note and vitals reviewed. Constitutional: She is oriented to person, place, and time. She appears well-developed and well-nourished. No distress.  HENT:  Head: Normocephalic and atraumatic.  Right Ear: External ear normal.  Left Ear: External ear normal.  Nose: Nose normal.  Mouth/Throat: Oropharynx is clear and moist.  Eyes: Conjunctivae are normal. No scleral icterus.  Neck: Normal range of motion. Neck supple. Muscular tenderness present. No spinous process tenderness present.    Pulmonary/Chest: Effort normal.  Musculoskeletal:       Thoracic back: She exhibits normal range of motion, no tenderness and no bony tenderness.       Lumbar back: She exhibits decreased range of motion and tenderness. She exhibits no bony tenderness and no deformity.       Back:  Lymphadenopathy:    She has no cervical adenopathy.  Neurological: She is alert and oriented to person, place, and time. She exhibits normal muscle tone. Coordination normal.  Reflex Scores:      Patellar reflexes are 2+ on the right side and 2+ on the left side.      Achilles reflexes are 2+ on the right side and 2+ on the left side. Skin: Skin is warm and dry. No rash noted. No erythema. No pallor.  Psychiatric: She has a normal mood and affect. Her behavior is normal. Judgment and thought content normal.    ED Course  Procedures (including critical care time) Labs Review Labs Reviewed - No data to display Imaging Review Dg Cervical Spine Complete  04/10/2013   CLINICAL DATA:  Fall.  Back pain.  L-spine surgery.  EXAM: CERVICAL SPINE  4+ VIEWS  COMPARISON:  None.  FINDINGS: There is no evidence of cervical spine fracture or  prevertebral soft tissue swelling. Alignment is normal. No other significant bone abnormalities are identified.  IMPRESSION: Negative cervical spine radiographs.   Electronically Signed   By: Rosalie Gums M.D.   On: 04/10/2013 21:38   Dg Lumbar Spine Complete  04/10/2013   CLINICAL DATA:  Fall.  Back pain.  Previous lumbar spine surgery.  EXAM: LUMBAR SPINE - COMPLETE 4+ VIEW  COMPARISON:  08/27/2012, 07/29/2012  FINDINGS: Patient has had previous posterior fusion at L4-5. Interbody bone plug is identified at the disc. There is no evidence for acute fracture. No suspicious lytic or blastic lesions are identified. Surgical clips are identified in the right upper quadrant of the abdomen.  IMPRESSION: 1. Postoperative changes. 2.  No evidence for acute  abnormality.   Electronically Signed   By: Rosalie Gums M.D.   On: 04/10/2013 21:40    EKG Interpretation   None       MDM   1. Cervical strain, acute, initial encounter   2. Lumbar strain, initial encounter    Patient with history of chronic lower back pain presents after mechanical fall, x-rays negative, no alarming signs to suggest cauda equina, epidural hematoma, epidural abscess.  Patient ambulatory after the event.  Pain control here with dilaudid and valium IM, will continue home medications and I have added muscle relaxers.    Izola Price Marisue Humble, [place] 04/10/13 2202

## 2013-04-11 ENCOUNTER — Other Ambulatory Visit: Payer: Self-pay

## 2013-04-11 MED ORDER — OXYCODONE HCL ER 40 MG PO T12A: 40.0000 mg | EXTENDED_RELEASE_TABLET | Freq: Two times a day (BID) | ORAL | Status: DC

## 2013-04-11 NOTE — ED Provider Notes (Signed)
Medical screening examination/treatment/procedure(s) were performed by non-physician practitioner and as supervising physician I was immediately available for consultation/collaboration.  EKG Interpretation   None       Maekayla Giorgio, MD, FACEP   Rameses Ou L Ginnie Marich, MD 04/11/13 0012 

## 2013-04-19 NOTE — Assessment & Plan Note (Signed)
Unchanged, chronic pain management as before  

## 2013-04-19 NOTE — Assessment & Plan Note (Signed)
Uncontrolled , clonidine added DASH diet and commitment to daily physical activity for a minimum of 30 minutes discussed and encouraged, as a part of hypertension management. The importance of attaining a healthy weight is also discussed.

## 2013-04-19 NOTE — Assessment & Plan Note (Signed)
Pt needs to establish with new urology grp due to multiple "no shows" with previous provider

## 2013-04-19 NOTE — Assessment & Plan Note (Signed)
Updated lab needed, past due .Patient advised to reduce carb and sweets, commit to regular physical activity, take meds as prescribed, test blood as directed, and attempt to lose weight, to improve blood sugar control.

## 2013-04-19 NOTE — Assessment & Plan Note (Signed)
Improved but not at goal. Increase fluoxetine dose and pt to return to therapist for counseling

## 2013-04-19 NOTE — Assessment & Plan Note (Signed)
Hyperlipidemia:Low fat diet discussed and encouraged.  Updated lab is past due

## 2013-04-19 NOTE — Assessment & Plan Note (Signed)
Deteriorated. Patient re-educated about  the importance of commitment to a  minimum of 150 minutes of exercise per week. The importance of healthy food choices with portion control discussed. Encouraged to start a food diary, count calories and to consider  joining a support group. Sample diet sheets offered. Goals set by the patient for the next several months.    

## 2013-04-20 ENCOUNTER — Telehealth: Payer: Self-pay | Admitting: Family Medicine

## 2013-04-20 NOTE — Telephone Encounter (Signed)
Pt has not had labs which were past due during her visit 3 weeks ago.Pls contact her and request that she get them done the week of 12/22 if possible. D/I will have labs needed if paper lost

## 2013-04-21 ENCOUNTER — Encounter: Payer: Self-pay | Admitting: Gastroenterology

## 2013-04-21 NOTE — Telephone Encounter (Signed)
Lab faxed. Patient states she will go have done today

## 2013-04-22 LAB — HEMOGLOBIN A1C: Mean Plasma Glucose: 137 mg/dL — ABNORMAL HIGH (ref <117)

## 2013-04-22 LAB — COMPLETE METABOLIC PANEL WITH GFR
AST: 47 U/L — ABNORMAL HIGH (ref 0–37)
Albumin: 3.8 g/dL (ref 3.5–5.2)
BUN: 13 mg/dL (ref 6–23)
CO2: 26 mEq/L (ref 19–32)
Calcium: 8.8 mg/dL (ref 8.4–10.5)
Chloride: 102 mEq/L (ref 96–112)
Creat: 0.68 mg/dL (ref 0.50–1.10)
GFR, Est African American: >89 mL/min
GFR, Est Non African American: >89 mL/min
Glucose, Bld: 109 mg/dL — ABNORMAL HIGH (ref 70–99)
Potassium: 3.9 mEq/L (ref 3.5–5.3)
Sodium: 137 mEq/L (ref 135–145)

## 2013-04-22 LAB — TSH: TSH: 2.479 u[IU]/mL (ref 0.350–4.500)

## 2013-04-22 LAB — LIPID PANEL
Cholesterol: 151 mg/dL (ref 0–200)
Triglycerides: 102 mg/dL (ref <150)
VLDL: 20 mg/dL (ref 0–40)

## 2013-04-23 LAB — MICROALBUMIN / CREATININE URINE RATIO
Creatinine, Urine: 100.5 mg/dL
Microalb, Ur: 0.5 mg/dL (ref 0.00–1.89)

## 2013-04-23 LAB — VITAMIN D 25 HYDROXY (VIT D DEFICIENCY, FRACTURES): Vit D, 25-Hydroxy: 13 ng/mL — ABNORMAL LOW (ref 30–89)

## 2013-04-28 ENCOUNTER — Telehealth: Payer: Self-pay

## 2013-04-29 ENCOUNTER — Other Ambulatory Visit: Payer: Self-pay | Admitting: Family Medicine

## 2013-04-29 MED ORDER — PREDNISONE 5 MG PO TABS: 5.0000 mg | ORAL_TABLET | Freq: Two times a day (BID) | ORAL | Status: AC

## 2013-04-29 MED ORDER — PROMETHAZINE-DM 6.25-15 MG/5ML PO SYRP: ORAL_SOLUTION | ORAL | Status: DC

## 2013-04-29 NOTE — Telephone Encounter (Signed)
Agree with recommendations re sudafed , ensure no fever m, chills etc, I am sending phenergan dm and 5 day course of prednisone , pls klet her know

## 2013-04-29 NOTE — Telephone Encounter (Signed)
Patient reports no fever/chills.  She is aware of rx.

## 2013-04-29 NOTE — Telephone Encounter (Signed)
Patient states that she is having sinus drainage, sore throat, cough that worsens at night.  She would like prescription strength cough med.  And recommendation for sinus.  I recommended Sudafed for sinus.  Pharmacy:  [facility]

## 2013-05-06 ENCOUNTER — Encounter: Payer: Self-pay | Admitting: Family Medicine

## 2013-05-07 ENCOUNTER — Ambulatory Visit: Payer: Self-pay | Admitting: Internal Medicine

## 2013-05-15 ENCOUNTER — Encounter: Payer: Self-pay | Admitting: Internal Medicine

## 2013-05-15 ENCOUNTER — Ambulatory Visit (INDEPENDENT_AMBULATORY_CARE_PROVIDER_SITE_OTHER): Payer: Medicare Other | Admitting: Internal Medicine

## 2013-05-15 VITALS — BP 162/90 | HR 83 | Temp 98.1°F | Ht 64.0 in | Wt 296.0 lb

## 2013-05-15 DIAGNOSIS — J45909 Unspecified asthma, uncomplicated: Secondary | ICD-10-CM

## 2013-05-15 DIAGNOSIS — I10 Essential (primary) hypertension: Secondary | ICD-10-CM

## 2013-05-15 DIAGNOSIS — R05 Cough: Secondary | ICD-10-CM

## 2013-05-15 DIAGNOSIS — R058 Other specified cough: Secondary | ICD-10-CM

## 2013-05-15 DIAGNOSIS — R059 Cough, unspecified: Secondary | ICD-10-CM

## 2013-05-15 NOTE — Assessment & Plan Note (Signed)
Classic Upper airway cough syndrome, so named because it's frequently impossible to sort out how much is  CR/sinusitis with freq throat clearing (which can be related to primary GERD)   vs  causing  secondary (" extra esophageal")  GERD from wide swings in gastric pressure that occur with throat clearing, often  promoting self use of mint and menthol lozenges that reduce the lower esophageal sphincter tone and exacerbate the problem further in a cyclical fashion.   Angela [name] are the same pts (now being labeled as having "irritable larynx syndrome" by some cough centers) who not infrequently have a history of having failed to tolerate ace inhibitors,  dry powder inhalers or biphosphonates or report having atypical reflux symptoms that don't respond to standard doses of PPI , and are easily confused as having aecopd or asthma flares by even experienced allergists/ pulmonologists.   For now needs to maintain off ICS and max rx of gerd If not effective, happy to see Angela [name] back here but would need to do full and accurate med rec first. See the written copy of this report in the patient's paper medical record.  Angela [name] results did not interface directly into the electronic medical record and are summarized here.

## 2013-05-15 NOTE — Assessment & Plan Note (Signed)
Spirometry wnl 05/15/2013 off all rx with active "wheeze"   This strongly supports dx of upper airway cough syndrome, see sep a/p

## 2013-05-15 NOTE — Assessment & Plan Note (Signed)
ACEi d/c 02/18/2013  Due to cough/ pseudowheeze  Records indicate still refilling lisinopril and still has pseudowheeze on exam although not coughing as much on gerd rx  Strongly advised her to stop these and no reason to see her back here until we can confirm she's off x 4 weeks min

## 2013-05-15 NOTE — Progress Notes (Signed)
Subjective:    [name], female    DOB: [birth year]  MRN: [id number]   Brief patient profile:  53yobf never smoker good ex to as child then in 1987 when moved [place] new job in [employer] with onset wheezing eval in [place] dx as asthma and after stopped working there did fine s need for inhalers  and continued to do well  until 06/2012 with onset of cough referred by Dr Bari Mantis 02/18/2013 to pulmonary clinic for cough eval.  Spirometry nl off inhalers 05/15/2013    History of Present Illness  02/18/2013 1st [facility] office visit/ Wert while on ACEi cc abrupt onset p ET in 06/2012  For shoulder surgery  and coughing daily since with prod of min clear mucus 24/7.Marland Kitchen   No vomit but urinates.  Worse when lie down. Sob mostly with coughing, not sob otherwise. Already tried singulair and cough meds, no better with symbicort either  rec Stop symbicort  For breathing:  Only use your albuterol (proaire) as a rescue medication  Tessalon pearls 200 mg every 4 hours as needed for cough and supplement with tramdol up 2 every 4 hours Nexium Take 30-60 min before first meal of the day and Pepcid 20 mg one at bedtime Stop lisinopril and micardis 80 mg take one half daily GERD diet    05/15/2013 f/u ov/Wert re: cough mostly resolved off acei > new  wheeze/sob worse since 1st of Dec  Chief Complaint  Patient presents with  . Follow-up    Pt states that her cough has improved since her last visit. Her wheezing has increased since she has gained more wt.    proair helps for a few hours only but has not used on day of ov and no symbicort since last ov Totally confused with meds, keeps them in a pillbox organized as to pill, not tyime of day (unlabled collection of the same pill)    No obvious day to day or daytime variabilty or assoc  cp or chest tightness  overt sinus symptoms. No unusual exp hx or h/o childhood pna/ asthma or knowledge of premature birth.  Sleeping ok without nocturnal  or  early am exacerbation  of respiratory  c/o's or need for noct saba. Also denies any obvious fluctuation of symptoms with weather or environmental changes or other aggravating or alleviating factors except as outlined above   Current Medications, Allergies, Complete Past Medical History, Past Surgical History, Family History, and Social History were reviewed in [employer] record.  ROS  The following are not active complaints unless bolded sore throat, dysphagia, dental problems, itching, sneezing,  nasal congestion or excess/ purulent secretions, ear ache,   fever, chills, sweats, unintended wt loss, pleuritic or exertional cp, hemoptysis,  orthopnea pnd or leg swelling, presyncope, palpitations, heartburn, abdominal pain, anorexia, nausea, vomiting, diarrhea  or change in bowel or urinary habits, change in stools or urine, dysuria,hematuria,  rash, arthralgias, visual complaints, headache, numbness weakness or ataxia or problems with walking or coordination,  change in mood/affect or memory.               Objective:   Physical Exam  Obese bf nad chewing mint gum    05/15/2013        296  Wt Readings from Last 3 Encounters:  02/18/13 287 lb (130.182 kg)  11/13/12 278 lb (126.1 kg)  11/07/12 270 lb (122.471 kg)     HEENT: nl dentition, turbinates, and orophanx. Nl  external ear canals without cough reflex   NECK :  without JVD/Nodes/TM/ nl carotid upstrokes bilaterally   LUNGS: no acc muscle use, clear to A and P bilaterally without cough on insp or exp maneuvers - prominent pseudowheeze   CV:  RRR  no s3 or murmur or increase in P2, no edema   ABD:  soft and nontender with nl excursion in the supine position. No bruits or organomegaly, bowel sounds nl  MS:  warm without deformities, calf tenderness, cyanosis or clubbing  SKIN: warm and dry without lesions                Assessment & Plan:

## 2013-05-15 NOTE — Patient Instructions (Addendum)
Continue nexium 40 mg Take 30-60 min before first meal of the day and Pepcid 20 mg at bedtime    GERD (REFLUX)  is an extremely common cause of respiratory symptoms, many times with no significant heartburn at all.    It can be treated with medication, but also with lifestyle changes including avoidance of late meals, excessive alcohol, smoking cessation, and avoid fatty foods, chocolate, peppermint, colas, red wine, and acidic juices such as orange juice.  NO MINT OR MENTHOL PRODUCTS SO NO COUGH DROPS  USE SUGARLESS CANDY INSTEAD (jolley ranchers or Stover's)  NO OIL BASED VITAMINS - use powdered substitutes.  Follow up here is as needed - if need to return to see me and not it's not an emergency I want to see  Tammy NP  with all your medications, even over the counter meds, separated in two separate bags, the ones you take no matter what vs the ones you stop once you feel better and take only as needed when you feel you need them.   Tammy  will generate for you a new user friendly medication calendar that will put [place] all on the same page re: your medication use.     Without this process, it simply isn't possible to assure that we are providing  your outpatient care  with  the attention to detail we feel you deserve.   If we cannot assure that you're getting that kind of care,  then we cannot manage your problem effectively from this clinic.  Once you have seen Tammy and we are sure that we're all on the same page with your medication use she will arrange follow up with me.

## 2013-05-19 ENCOUNTER — Ambulatory Visit: Payer: PRIVATE HEALTH INSURANCE | Admitting: Family Medicine

## 2013-05-21 ENCOUNTER — Ambulatory Visit: Payer: PRIVATE HEALTH INSURANCE | Admitting: Family Medicine

## 2013-05-23 ENCOUNTER — Other Ambulatory Visit: Payer: Self-pay

## 2013-05-23 MED ORDER — OXYCODONE HCL ER 40 MG PO T12A: 40.0000 mg | EXTENDED_RELEASE_TABLET | Freq: Two times a day (BID) | ORAL | Status: DC

## 2013-05-26 ENCOUNTER — Ambulatory Visit (INDEPENDENT_AMBULATORY_CARE_PROVIDER_SITE_OTHER): Payer: PRIVATE HEALTH INSURANCE | Admitting: Family Medicine

## 2013-05-26 ENCOUNTER — Encounter: Payer: Self-pay | Admitting: Family Medicine

## 2013-05-26 VITALS — BP 144/76 | HR 86 | Resp 18 | Wt 292.1 lb

## 2013-05-26 DIAGNOSIS — I10 Essential (primary) hypertension: Secondary | ICD-10-CM

## 2013-05-26 DIAGNOSIS — M549 Dorsalgia, unspecified: Secondary | ICD-10-CM

## 2013-05-26 DIAGNOSIS — M545 Low back pain, unspecified: Secondary | ICD-10-CM

## 2013-05-26 DIAGNOSIS — E669 Obesity, unspecified: Secondary | ICD-10-CM

## 2013-05-26 DIAGNOSIS — E785 Hyperlipidemia, unspecified: Secondary | ICD-10-CM

## 2013-05-26 DIAGNOSIS — I2510 Atherosclerotic heart disease of native coronary artery without angina pectoris: Secondary | ICD-10-CM | POA: Insufficient documentation

## 2013-05-26 DIAGNOSIS — E119 Type 2 diabetes mellitus without complications: Secondary | ICD-10-CM

## 2013-05-26 DIAGNOSIS — I509 Heart failure, unspecified: Secondary | ICD-10-CM

## 2013-05-26 MED ORDER — POTASSIUM CHLORIDE CRYS ER 20 MEQ PO TBCR: 20.0000 meq | EXTENDED_RELEASE_TABLET | Freq: Two times a day (BID) | ORAL | Status: DC

## 2013-05-26 MED ORDER — FUROSEMIDE 40 MG PO TABS: 40.0000 mg | ORAL_TABLET | Freq: Two times a day (BID) | ORAL | Status: DC

## 2013-05-26 NOTE — Patient Instructions (Addendum)
F/u in 3.5 month  You are being referred for MRI of your low back following the fall and to see Dr Bryna Colander will be referred to [facility] cardiology in [facility] [place] for evaluation for CAD and CHF  Contuiinue current medication. EKG today shows no acute problem but possibly old heart disease  Toradol for back pain in office  Lasix and potassium have been started to help with your heart failure symptoms until you see the cardiologist  Restrict sodium and fluid intake to 2 liters daily  Chem 7 in 10 days please, non fasting

## 2013-05-27 MED ORDER — KETOROLAC TROMETHAMINE 60 MG/2ML IM SOLN
60.0000 mg | Freq: Once | INTRAMUSCULAR | Status: AC
Start: 2013-05-27 — End: 2013-05-27
  Administered 2013-05-27: 60 mg via INTRAMUSCULAR

## 2013-05-29 ENCOUNTER — Ambulatory Visit (HOSPITAL_COMMUNITY)
Admission: RE | Admit: 2013-05-29 | Discharge: 2013-05-29 | Disposition: A | Discharge status: Home / Self Care | Payer: PRIVATE HEALTH INSURANCE | Source: Ambulatory Visit | Attending: Family Medicine | Admitting: Family Medicine

## 2013-05-29 ENCOUNTER — Other Ambulatory Visit: Payer: Self-pay | Admitting: Family Medicine

## 2013-05-29 DIAGNOSIS — M545 Low back pain, unspecified: Secondary | ICD-10-CM

## 2013-06-02 NOTE — Assessment & Plan Note (Signed)
Uncontrolled , toradol in office  Rept MRI low back

## 2013-06-02 NOTE — Progress Notes (Signed)
   Subjective:    [name], female    DOB: 1960-10-01, 53 y.o.   MRN: [id number]  HPI The PT is here for follow up and re-evaluation of chronic medical conditions, medication management and review of any available recent lab and radiology data.  Preventive health is updated, specifically  Cancer screening and Immunization.   . The PT denies any adverse reactions to current medications since the last visit.  C/o increased and uncontrolled low back pain radiating to her buttock and thigh following a recent fall. C/o ongoing weight gain with orthopnea, increased dyspnea and poor exercise tolerance as well as  intermittent chest pressure. Has CAD  And has not seen cardiologist in over 2 years, now ready to establish with a local Provider. C/o mmarked leg swelling and inability to put on her regualr footwear      Review of Systems See HPI Denies recent fever or chills. Denies sinus pressure, nasal congestion, ear pain or sore throat. Denies chest congestion, productive cough or wheezing.  Denies abdominal pain, nausea, vomiting,diarrhea or constipation.   Denies dysuria, frequency, hesitancy or incontinence. Denies headaches, seizures, numbness, or tingling. Denies uncontrolled  depression, anxiety and  Insomnia.Has benefted from therapy and plans to return, also her daughter is "talking to her" again Denies skin break down or rash.        Objective:   Physical Exam  Patient alert and oriented and in no cardiopulmonary distress.  HEENT: No facial asymmetry, EOMI, no sinus tenderness,  oropharynx pink and moist.  Neck supple no adenopathy.  Chest: Clear to auscultation bilaterally.  CVS: S1, S2 no murmurs, no S3.Nop reproducible chest wall tendserness. EKG shows no acute ischemia  ABD: Soft non tender. Bowel sounds normal.  Ext: 2 plus edema  MS: decreased ROM lumbar spine , adequate in shoulders and knees  Skin: Intact, no ulcerations or rash  noted.  Psych: Good eye contact, normal affect. Memory intact not anxious or depressed appearing.  CNS: CN 2-12 intact, power, tone and sensation normal throughout.       Assessment & Plan:

## 2013-06-02 NOTE — Assessment & Plan Note (Signed)
Controlled, no change in medication Hyperlipidemia:Low fat diet discussed and encouraged.  \ 

## 2013-06-02 NOTE — Assessment & Plan Note (Signed)
Deteriorated. Patient re-educated about  the importance of commitment to a  minimum of 150 minutes of exercise per week. The importance of healthy food choices with portion control discussed. Encouraged to start a food diary, count calories and to consider  joining a support group. Sample diet sheets offered. Goals set by the patient for the next several months.   I believe that some of this weight is due to fluid

## 2013-06-02 NOTE — Assessment & Plan Note (Signed)
Symptomatic, has h/o CAD and has not had cardiology eval in 2 years will establish with new local provider P tto start lasix and reminded to restrict salt and fluid intake

## 2013-06-02 NOTE — Assessment & Plan Note (Signed)
Controlled, no change in medication Patient advised to reduce carb and sweets, commit to regular physical activity, take meds as prescribed, test blood as directed, and attempt to lose weight, to improve blood sugar control.  

## 2013-06-02 NOTE — Assessment & Plan Note (Signed)
Uncontrolled, hopefully lasix will help to achieve goal  DASH diet and commitment to daily physical activity for a minimum of 30 minutes discussed and encouraged, as a part of hypertension management. The importance of attaining a healthy weight is also discussed.

## 2013-06-04 ENCOUNTER — Encounter: Payer: Self-pay | Admitting: Cardiology

## 2013-06-04 ENCOUNTER — Encounter: Payer: Self-pay | Admitting: *Deleted

## 2013-06-04 NOTE — Progress Notes (Signed)
No show  This encounter was created in error - please disregard.

## 2013-06-07 ENCOUNTER — Other Ambulatory Visit: Payer: Self-pay | Admitting: Family Medicine

## 2013-06-07 ENCOUNTER — Ambulatory Visit: Admission: RE | Admit: 2013-06-07 | Payer: PRIVATE HEALTH INSURANCE | Source: Ambulatory Visit

## 2013-06-07 ENCOUNTER — Ambulatory Visit
Admission: RE | Admit: 2013-06-07 | Discharge: 2013-06-07 | Disposition: A | Discharge status: Home / Self Care | Payer: PRIVATE HEALTH INSURANCE | Source: Ambulatory Visit | Attending: Family Medicine | Admitting: Family Medicine

## 2013-06-07 DIAGNOSIS — R52 Pain, unspecified: Secondary | ICD-10-CM

## 2013-06-11 ENCOUNTER — Other Ambulatory Visit: Payer: Self-pay

## 2013-06-11 ENCOUNTER — Telehealth: Payer: Self-pay

## 2013-06-11 DIAGNOSIS — IMO0002 Reserved for concepts with insufficient information to code with codable children: Secondary | ICD-10-CM

## 2013-06-11 NOTE — Telephone Encounter (Signed)
Patient aware of her results 

## 2013-06-19 ENCOUNTER — Other Ambulatory Visit: Payer: Self-pay

## 2013-06-19 MED ORDER — OXYCODONE HCL ER 40 MG PO T12A: 40.0000 mg | EXTENDED_RELEASE_TABLET | Freq: Two times a day (BID) | ORAL | Status: DC

## 2013-06-24 ENCOUNTER — Ambulatory Visit: Payer: Self-pay | Admitting: Cardiology

## 2013-06-27 ENCOUNTER — Telehealth: Payer: Self-pay | Admitting: Internal Medicine

## 2013-06-27 NOTE — Telephone Encounter (Signed)
Called and made pt aware of recs. Nothing further needed 

## 2013-06-27 NOTE — Telephone Encounter (Signed)
Spoke with the pt  She is c/o increased cough for the past 3 days  The cough is non prod and causes and burning feeling in her chest She denies any CP, wheezing, SOB, f/c/s She is still on PPI regimen  She has not tried anything OTC b/c she wants MD approval   Will forward to doc of the day in MW's absence  I had offered ov but she is unable to come in b/c has appt at [facility] for her back  Please advise thanks  Allergies  Allergen Reactions  . Morphine Other (See Comments)    Reaction with esophagus, unable to swallow.   . Ace Inhibitors Cough  . Aspirin Other (See Comments)    Reaction with esophagus, unable to swallow.   . Losartan Cough  . Adhesive [Tape] Rash

## 2013-06-27 NOTE — Telephone Encounter (Signed)
Advise her to try OTC delsym for cough.  She should follow instructions on box.  She should call back if she is not feeling better.

## 2013-07-02 ENCOUNTER — Ambulatory Visit: Payer: Self-pay | Admitting: Cardiology

## 2013-07-02 NOTE — Progress Notes (Unsigned)
Clinical Summary Angela [name] is a 53 y.o.female seen today as a new patient, she is referred for    1. Dyspnea    Past Medical History  Diagnosis Date  . Low back pain   . Helicobacter pylori gastritis 2008  . Severe obesity (BMI >= 40)   . GERD (gastroesophageal reflux disease)   . H/O hiatal hernia   . Anxiety   . Depression   . Asthma   . Essential hypertension, benign   . Hypercholesterolemia   . Degenerative disc disease   . Dysrhythmia   . Sleep apnea     STOP BANG SCORE 6no cpap used  . Migraine   . Arthritis   . Type 2 diabetes mellitus with diabetic neuropathy   . Nephrolithiasis     Recurring episodes since 2004  . Hyperthyroidism     s/p radiation  . Coronary atherosclerosis of native coronary artery     Mild nonobstructive CAD by cardiac catheterization 2008 - Dr. Terrence Dupont     Allergies  Allergen Reactions  . Morphine Other (See Comments)    Reaction with esophagus, unable to swallow.   . Ace Inhibitors Cough  . Aspirin Other (See Comments)    Reaction with esophagus, unable to swallow.   . Losartan Cough  . Adhesive [Tape] Rash     Current Outpatient Prescriptions  Medication Sig Dispense Refill  . albuterol (PROAIR HFA) 108 (90 BASE) MCG/ACT inhaler Inhale 2 puffs into the lungs every 6 (six) hours as needed for wheezing or shortness of breath.      Marland Kitchen amLODipine (NORVASC) 10 MG tablet Take 1 tablet (10 mg total) by mouth daily.  30 tablet  11  . atorvastatin (LIPITOR) 40 MG tablet Take 40 mg by mouth every morning.      . cloNIDine (CATAPRES) 0.3 MG tablet One tablet at 9 pm every night for high blood pressure  30 tablet  4  . cyclobenzaprine (FLEXERIL) 10 MG tablet Take 1 tablet (10 mg total) by mouth 2 (two) times daily as needed for muscle spasms.  20 tablet  0  . diazepam (VALIUM) 5 MG tablet TAKE 1 TABLET BY MOUTH 3 TIMES A DAY  90 tablet  1  . ergocalciferol (VITAMIN D2) 50000 UNITS capsule Take 50,000 Units by mouth once a week. Takes  on Monday  12 capsule  1  . esomeprazole (NEXIUM) 40 MG capsule Take 40 mg by mouth daily.      Marland Kitchen FLUoxetine (PROZAC) 40 MG capsule Take 1 capsule (40 mg total) by mouth daily.  30 capsule  3  . furosemide (LASIX) 40 MG tablet Take 1 tablet (40 mg total) by mouth 2 (two) times daily.  60 tablet  2  . gabapentin (NEURONTIN) 800 MG tablet Take 800 mg by mouth 3 (three) times daily.       Marland Kitchen lisinopril (PRINIVIL,ZESTRIL) 5 MG tablet       . metFORMIN (GLUCOPHAGE) 1000 MG tablet Take 1,000 mg by mouth 2 (two) times daily with a meal.      . nitroGLYCERIN (NITROLINGUAL) 0.4 MG/SPRAY spray Place 1 spray under the tongue every 5 (five) minutes as needed. For chest pain      . OxyCODONE (OXYCONTIN) 40 mg T12A 12 hr tablet Take 1 tablet (40 mg total) by mouth every 12 (twelve) hours.  60 tablet  0  . potassium chloride SA (K-DUR,KLOR-CON) 20 MEQ tablet Take 1 tablet (20 mEq total) by mouth 2 (two) times  daily.  60 tablet  2  . zolpidem (AMBIEN) 10 MG tablet Take 10 mg by mouth at bedtime.       . [DISCONTINUED] losartan-hydrochlorothiazide (HYZAAR) 50-12.5 MG per tablet TAKE 1 TABLET BY MOUTH ONCE DAILY *REPLACES DIOVAN/HCTZ*  30 tablet  0   No current facility-administered medications for this visit.     Past Surgical History  Procedure Laterality Date  . Cholecystectomy  1996  . Left knee arthroscopic surgery  1999  . Left salphingectomy secondary to ectopic pregnancy  1991  . Partial hysterectomy  1991  . Knee arthroscopy  10/04/2010,     right knee arthroscopy, dr Theda Sers  . Upper gastrointestinal endoscopy  2008 abd pain    H. pylori gastritis  . Upper gastrointestinal endoscopy  1996 [place]    PUD  . Colonoscopy  2000 [facility]    INT HEMORRHOIDS/FISSURE  . Colonoscopy  2003 [facility], CHANGE IN BOWEL HABITS    INT HEMORRHOIDS  . Colonoscopy  2006 [facility]    INT HEMORRHOIDS  . Colonoscopy  2007 [facility]    INT HEMORRHOIDS  . Esophagogastroduodenoscopy  2008    [id number] hiatal  hernia./Normal esophagus without evidence of Barrett's mass, stricture, erosion or ulceration./Normal duodenal bulb and second portion of the duodenum./Diffuse erythema in the body and the antrum with occasional erosion.  Biopsies obtained via cold forceps to evaluate for H. pylori gastritis  . Back surgery  1993  . Back surgery  1994  . Back surgery  2002  . Back surgery  2011    [facility]   . Cystoscopy/retrograde/ureteroscopy  12/04/2011    Procedure: CYSTOSCOPY/RETROGRADE/URETEROSCOPY;  Surgeon: Molli Hazard, MD;  Location: [facility];  Service: Urology;  Laterality: Right;  . Abdominal hysterectomy    . Cystoscopy with stent placement Left 07/25/2012    Procedure: CYSTOSCOPY, left retograde pyelogram WITH left ureteral  STENT PLACEMENT;  Surgeon: Claybon Jabs, MD;  Location: [facility];  Service: Urology;  Laterality: Left;  . Fracture surgery  1999    right clavicle  . Holmium laser application Left [id number]    Procedure: HOLMIUM LASER APPLICATION;  Surgeon: Molli Hazard, MD;  Location: [facility];  Service: Urology;  Laterality: Left;  . Cystoscopy/retrograde/ureteroscopy/stone extraction with basket Left 09/03/2012    Procedure: CYSTOSCOPY/RETROGRADE pyelogram/digital URETEROSCOPY/STONE EXTRACTION WITH BASKET, left stent removal;  Surgeon: Molli Hazard, MD;  Location: [facility];  Service: Urology;  Laterality: Left;     Allergies  Allergen Reactions  . Morphine Other (See Comments)    Reaction with esophagus, unable to swallow.   . Ace Inhibitors Cough  . Aspirin Other (See Comments)    Reaction with esophagus, unable to swallow.   . Losartan Cough  . Adhesive [Tape] Rash      Family History  Problem Relation Age of Onset  . Heart disease Mother   . Hypertension Mother   . Cancer Mother     Cervical   . Heart attack Father   . Cancer Father     Prostate  . Colon polyps Neg Hx   . Colon cancer Neg Hx      Social History Angela [name] reports that she has  never smoked. She has never used smokeless tobacco. Angela [name] reports that she does not drink alcohol.   Review of Systems CONSTITUTIONAL: No weight loss, fever, chills, weakness or fatigue.  HEENT: Eyes: No visual loss, blurred vision, double vision or yellow sclerae.No hearing loss, sneezing, congestion, runny nose or sore  throat.  SKIN: No rash or itching.  CARDIOVASCULAR:  RESPIRATORY: No shortness of breath, cough or sputum.  GASTROINTESTINAL: No anorexia, nausea, vomiting or diarrhea. No abdominal pain or blood.  GENITOURINARY: No burning on urination, no polyuria NEUROLOGICAL: No headache, dizziness, syncope, paralysis, ataxia, numbness or tingling in the extremities. No change in bowel or bladder control.  MUSCULOSKELETAL: No muscle, back pain, joint pain or stiffness.  LYMPHATICS: No enlarged nodes. No history of splenectomy.  PSYCHIATRIC: No history of depression or anxiety.  ENDOCRINOLOGIC: No reports of sweating, cold or heat intolerance. No polyuria or polydipsia.  Marland Kitchen   Physical Examination There were no vitals filed for this visit. There were no vitals filed for this visit.  Gen: resting comfortably, no acute distress HEENT: no scleral icterus, pupils equal round and reactive, no palptable cervical adenopathy,  CV Resp: Clear to auscultation bilaterally GI: abdomen is soft, non-tender, non-distended, normal bowel sounds, no hepatosplenomegaly MSK: extremities are warm, no edema.  Skin: warm, no rash Neuro:  no focal deficits Psych: appropriate affect   Diagnostic Studies     Assessment and Plan        Arnoldo Lenis, M.D., F.A.C.C.

## 2013-07-11 ENCOUNTER — Telehealth: Payer: Self-pay

## 2013-07-13 ENCOUNTER — Encounter (HOSPITAL_COMMUNITY): Payer: Self-pay | Admitting: Emergency Medicine

## 2013-07-13 ENCOUNTER — Emergency Department (HOSPITAL_COMMUNITY): Payer: PRIVATE HEALTH INSURANCE

## 2013-07-13 ENCOUNTER — Emergency Department (HOSPITAL_COMMUNITY)
Admission: EM | Admit: 2013-07-13 | Discharge: 2013-07-13 | Disposition: A | Discharge status: Home / Self Care | Payer: PRIVATE HEALTH INSURANCE | Attending: Emergency Medicine | Admitting: Emergency Medicine

## 2013-07-13 DIAGNOSIS — Z79899 Other long term (current) drug therapy: Secondary | ICD-10-CM | POA: Insufficient documentation

## 2013-07-13 DIAGNOSIS — G4730 Sleep apnea, unspecified: Secondary | ICD-10-CM | POA: Insufficient documentation

## 2013-07-13 DIAGNOSIS — F329 Major depressive disorder, single episode, unspecified: Secondary | ICD-10-CM | POA: Insufficient documentation

## 2013-07-13 DIAGNOSIS — E7800 Pure hypercholesterolemia, unspecified: Secondary | ICD-10-CM | POA: Insufficient documentation

## 2013-07-13 DIAGNOSIS — I10 Essential (primary) hypertension: Secondary | ICD-10-CM | POA: Insufficient documentation

## 2013-07-13 DIAGNOSIS — Z8739 Personal history of other diseases of the musculoskeletal system and connective tissue: Secondary | ICD-10-CM | POA: Insufficient documentation

## 2013-07-13 DIAGNOSIS — J45909 Unspecified asthma, uncomplicated: Secondary | ICD-10-CM | POA: Insufficient documentation

## 2013-07-13 DIAGNOSIS — Z9861 Coronary angioplasty status: Secondary | ICD-10-CM | POA: Insufficient documentation

## 2013-07-13 DIAGNOSIS — K219 Gastro-esophageal reflux disease without esophagitis: Secondary | ICD-10-CM | POA: Insufficient documentation

## 2013-07-13 DIAGNOSIS — I2510 Atherosclerotic heart disease of native coronary artery without angina pectoris: Secondary | ICD-10-CM | POA: Insufficient documentation

## 2013-07-13 DIAGNOSIS — Y9389 Activity, other specified: Secondary | ICD-10-CM | POA: Insufficient documentation

## 2013-07-13 DIAGNOSIS — E1149 Type 2 diabetes mellitus with other diabetic neurological complication: Secondary | ICD-10-CM | POA: Insufficient documentation

## 2013-07-13 DIAGNOSIS — S199XXA Unspecified injury of neck, initial encounter: Principal | ICD-10-CM

## 2013-07-13 DIAGNOSIS — S0993XA Unspecified injury of face, initial encounter: Secondary | ICD-10-CM | POA: Insufficient documentation

## 2013-07-13 DIAGNOSIS — F411 Generalized anxiety disorder: Secondary | ICD-10-CM | POA: Insufficient documentation

## 2013-07-13 DIAGNOSIS — E669 Obesity, unspecified: Secondary | ICD-10-CM | POA: Insufficient documentation

## 2013-07-13 DIAGNOSIS — E1142 Type 2 diabetes mellitus with diabetic polyneuropathy: Secondary | ICD-10-CM | POA: Insufficient documentation

## 2013-07-13 DIAGNOSIS — Y92410 Unspecified street and highway as the place of occurrence of the external cause: Secondary | ICD-10-CM | POA: Insufficient documentation

## 2013-07-13 DIAGNOSIS — Z8679 Personal history of other diseases of the circulatory system: Secondary | ICD-10-CM | POA: Insufficient documentation

## 2013-07-13 DIAGNOSIS — Z8619 Personal history of other infectious and parasitic diseases: Secondary | ICD-10-CM | POA: Insufficient documentation

## 2013-07-13 DIAGNOSIS — Z87442 Personal history of urinary calculi: Secondary | ICD-10-CM | POA: Insufficient documentation

## 2013-07-13 DIAGNOSIS — F3289 Other specified depressive episodes: Secondary | ICD-10-CM | POA: Insufficient documentation

## 2013-07-13 LAB — COMPREHENSIVE METABOLIC PANEL
ALK PHOS: 85 U/L (ref 39–117)
ALT: 35 U/L (ref 0–35)
AST: 27 U/L (ref 0–37)
Albumin: 3.8 g/dL (ref 3.5–5.2)
BUN: 12 mg/dL (ref 6–23)
CO2: 30 meq/L (ref 19–32)
Calcium: 9.3 mg/dL (ref 8.4–10.5)
Chloride: 102 mEq/L (ref 96–112)
Creatinine, Ser: 0.65 mg/dL (ref 0.50–1.10)
Glucose, Bld: 104 mg/dL — ABNORMAL HIGH (ref 70–99)
Potassium: 3.4 mEq/L — ABNORMAL LOW (ref 3.7–5.3)
SODIUM: 142 meq/L (ref 137–147)
Total Bilirubin: 0.2 mg/dL — ABNORMAL LOW (ref 0.3–1.2)
Total Protein: 8 g/dL (ref 6.0–8.3)

## 2013-07-13 LAB — CBC WITH DIFFERENTIAL/PLATELET
Basophils Absolute: 0 10*3/uL (ref 0.0–0.1)
Basophils Relative: 0 % (ref 0–1)
Eosinophils Absolute: 0.2 10*3/uL (ref 0.0–0.7)
Eosinophils Relative: 3 % (ref 0–5)
HCT: 36.3 % (ref 36.0–46.0)
HEMOGLOBIN: 12 g/dL (ref 12.0–15.0)
Lymphocytes Relative: 38 % (ref 12–46)
Lymphs Abs: 2.2 10*3/uL (ref 0.7–4.0)
MCH: 26.8 pg (ref 26.0–34.0)
MCHC: 33.1 g/dL (ref 30.0–36.0)
MCV: 81.2 fL (ref 78.0–100.0)
MONO ABS: 0.4 10*3/uL (ref 0.1–1.0)
MONOS PCT: 7 % (ref 3–12)
Neutro Abs: 3.1 10*3/uL (ref 1.7–7.7)
Neutrophils Relative %: 53 % (ref 43–77)
Platelets: 423 10*3/uL — ABNORMAL HIGH (ref 150–400)
RBC: 4.47 MIL/uL (ref 3.87–5.11)
RDW: 14.4 % (ref 11.5–15.5)
WBC: 5.9 10*3/uL (ref 4.0–10.5)

## 2013-07-13 LAB — URINALYSIS, ROUTINE W REFLEX MICROSCOPIC
BILIRUBIN URINE: NEGATIVE
GLUCOSE, UA: NEGATIVE mg/dL
HGB URINE DIPSTICK: NEGATIVE
Ketones, ur: NEGATIVE mg/dL
Leukocytes, UA: NEGATIVE
Nitrite: NEGATIVE
Protein, ur: NEGATIVE mg/dL
Specific Gravity, Urine: 1.02 (ref 1.005–1.030)
UROBILINOGEN UA: 0.2 mg/dL (ref 0.0–1.0)
pH: 7 (ref 5.0–8.0)

## 2013-07-13 LAB — LIPASE, BLOOD: Lipase: 19 U/L (ref 11–59)

## 2013-07-13 MED ORDER — ONDANSETRON HCL 4 MG/2ML IJ SOLN
4.0000 mg | Freq: Once | INTRAMUSCULAR | Status: AC
  Administered 2013-07-13: 4 mg via INTRAVENOUS
  Filled 2013-07-13: qty 2

## 2013-07-13 MED ORDER — FENTANYL CITRATE 0.05 MG/ML IJ SOLN
50.0000 ug | Freq: Once | INTRAMUSCULAR | Status: AC
  Administered 2013-07-13: 50 ug via INTRAVENOUS
  Filled 2013-07-13: qty 2

## 2013-07-13 MED ORDER — SODIUM CHLORIDE 0.9 % IV BOLUS (SEPSIS)
1000.0000 mL | Freq: Once | INTRAVENOUS | Status: AC
  Administered 2013-07-13: 1000 mL via INTRAVENOUS

## 2013-07-13 NOTE — ED Notes (Signed)
Pt reports hematemesis, last episode this am. Pt also reports numbness in her tongue. Pt acuity changed to 3.
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
## 2013-07-13 NOTE — ED Notes (Addendum)
Pt never mentioned while being triaged that she had vomited up blood. Pt states she told registration and forgot to tell me.
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
## 2013-07-13 NOTE — ED Notes (Signed)
nad noted prior to dc. Dc instructions reviewed and explained. Voiced understanding.  
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
## 2013-07-14 NOTE — Telephone Encounter (Signed)
pls call pt and arrange OV this week, also as an ED follow up

## 2013-07-14 NOTE — Telephone Encounter (Signed)
Called.  Unable to leave message.

## 2013-07-15 ENCOUNTER — Encounter: Payer: Self-pay | Admitting: Family Medicine

## 2013-07-15 ENCOUNTER — Ambulatory Visit: Payer: PRIVATE HEALTH INSURANCE | Admitting: Family Medicine

## 2013-07-15 ENCOUNTER — Ambulatory Visit (INDEPENDENT_AMBULATORY_CARE_PROVIDER_SITE_OTHER): Payer: PRIVATE HEALTH INSURANCE | Admitting: Family Medicine

## 2013-07-15 DIAGNOSIS — M545 Low back pain, unspecified: Secondary | ICD-10-CM

## 2013-07-15 DIAGNOSIS — M542 Cervicalgia: Secondary | ICD-10-CM

## 2013-07-15 DIAGNOSIS — I10 Essential (primary) hypertension: Secondary | ICD-10-CM

## 2013-07-15 DIAGNOSIS — E119 Type 2 diabetes mellitus without complications: Secondary | ICD-10-CM

## 2013-07-15 DIAGNOSIS — E785 Hyperlipidemia, unspecified: Secondary | ICD-10-CM

## 2013-07-15 MED ORDER — TRIAMTERENE-HCTZ 37.5-25 MG PO TABS: 1.0000 | ORAL_TABLET | Freq: Every day | ORAL | Status: DC

## 2013-07-15 MED ORDER — PREDNISONE 5 MG PO KIT: PACK | ORAL | Status: DC

## 2013-07-15 MED ORDER — IBUPROFEN 800 MG PO TABS: 800.0000 mg | ORAL_TABLET | Freq: Two times a day (BID) | ORAL | Status: DC

## 2013-07-15 MED ORDER — CYCLOBENZAPRINE HCL 10 MG PO TABS: 10.0000 mg | ORAL_TABLET | Freq: Three times a day (TID) | ORAL | Status: DC | PRN
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
## 2013-07-16 MED ORDER — METHYLPREDNISOLONE ACETATE 80 MG/ML IJ SUSP
80.0000 mg | Freq: Once | INTRAMUSCULAR | Status: AC
  Administered 2013-07-15: 80 mg via INTRAMUSCULAR

## 2013-07-17 ENCOUNTER — Ambulatory Visit: Payer: PRIVATE HEALTH INSURANCE | Admitting: Family Medicine

## 2013-07-21 ENCOUNTER — Ambulatory Visit (HOSPITAL_COMMUNITY)
Admission: RE | Admit: 2013-07-21 | Discharge: 2013-07-21 | Disposition: A | Discharge status: Home / Self Care | Payer: No Typology Code available for payment source | Source: Ambulatory Visit | Attending: Sports Medicine | Admitting: Sports Medicine

## 2013-07-21 ENCOUNTER — Telehealth: Payer: Self-pay | Admitting: Family Medicine

## 2013-07-21 DIAGNOSIS — M542 Cervicalgia: Secondary | ICD-10-CM | POA: Diagnosis not present

## 2013-07-21 DIAGNOSIS — E119 Type 2 diabetes mellitus without complications: Secondary | ICD-10-CM | POA: Insufficient documentation

## 2013-07-21 DIAGNOSIS — I10 Essential (primary) hypertension: Secondary | ICD-10-CM | POA: Diagnosis not present

## 2013-07-21 DIAGNOSIS — IMO0001 Reserved for inherently not codable concepts without codable children: Secondary | ICD-10-CM | POA: Insufficient documentation
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
## 2013-07-21 NOTE — Telephone Encounter (Signed)
Patient currently on disability but was working a part time job. States she is unable to continue working and wants a letter from you that states due to her current medical conditions she is no longer able to work so she can give it to her employer. Please advise

## 2013-07-21 NOTE — Telephone Encounter (Signed)
I agree unable to work so if you type letter I will sign

## 2013-07-22 ENCOUNTER — Ambulatory Visit (INDEPENDENT_AMBULATORY_CARE_PROVIDER_SITE_OTHER): Payer: PRIVATE HEALTH INSURANCE | Admitting: Cardiology

## 2013-07-22 ENCOUNTER — Encounter: Payer: Self-pay | Admitting: Cardiology

## 2013-07-22 VITALS — BP 172/85 | HR 85 | Ht 64.0 in | Wt 289.1 lb

## 2013-07-22 DIAGNOSIS — E785 Hyperlipidemia, unspecified: Secondary | ICD-10-CM

## 2013-07-22 DIAGNOSIS — R0602 Shortness of breath: Secondary | ICD-10-CM

## 2013-07-22 DIAGNOSIS — R079 Chest pain, unspecified: Secondary | ICD-10-CM

## 2013-07-22 NOTE — Telephone Encounter (Signed)
LETTER TYPED

## 2013-07-22 NOTE — Progress Notes (Signed)
Clinical Summary Angela [name] is a 53 y.o.female seen today as a new patient. She is referred due to a history of CAD    1. CAD/Chest pain - prior history of chest pain, referred for cath in 2008 with non-obstructive disease - starting in Feb dull pain under left breast, 8/10. +Nauseous, + SOB, + diaphoresis. Can occur at rest or with exertion. Not positional. Lasts up to 30 minutes, feels fatigued after. Occurs 4 times a day. Stable in freuquency and severity since onset. Different from pain in 2008.  - noticed increased swelling in legs over same time period. Has noticed some increasing DOE over the same time period. - compliant with meds  CAD risk factors: HTN, HL, DM, mother MI 6s  2. HTN - compliant with meds - does not check regularly at home - reports recent increase in one of her bp meds 1 week ago, maxide was increased  3. Hyperlipidemia - compliant with statin - has repeat panel pending by pcp   Past Medical History  Diagnosis Date  . Low back pain   . Helicobacter pylori gastritis 2008  . Severe obesity (BMI >= 40)   . GERD (gastroesophageal reflux disease)   . H/O hiatal hernia   . Anxiety   . Depression   . Asthma   . Essential hypertension, benign   . Hypercholesterolemia   . Degenerative disc disease   . Dysrhythmia   . Sleep apnea     STOP BANG SCORE 6no cpap used  . Migraine   . Arthritis   . Type 2 diabetes mellitus with diabetic neuropathy   . Nephrolithiasis     Recurring episodes since 2004  . Hyperthyroidism     s/p radiation  . Coronary atherosclerosis of native coronary artery     Mild nonobstructive CAD by cardiac catheterization 2008 - Dr. Terrence Dupont     Allergies  Allergen Reactions  . Morphine Other (See Comments)    Reaction with esophagus, unable to swallow.   . Ace Inhibitors Cough  . Aspirin Other (See Comments)    Reaction with esophagus, unable to swallow.   . Losartan Cough  . Tomato Other (See Comments)    Acid reflux  due to acid in tomato  . Adhesive [Tape] Rash     Current Outpatient Prescriptions  Medication Sig Dispense Refill  . albuterol (PROAIR HFA) 108 (90 BASE) MCG/ACT inhaler Inhale 2 puffs into the lungs every 6 (six) hours as needed for wheezing or shortness of breath.      Marland Kitchen amLODipine (NORVASC) 10 MG tablet Take 1 tablet (10 mg total) by mouth daily.  30 tablet  11  . atorvastatin (LIPITOR) 40 MG tablet Take 40 mg by mouth every morning.      . cloNIDine (CATAPRES) 0.3 MG tablet Take 0.3 mg by mouth at bedtime.      . cyclobenzaprine (FLEXERIL) 10 MG tablet Take 1 tablet (10 mg total) by mouth 3 (three) times daily as needed for muscle spasms.  60 tablet  1  . diazepam (VALIUM) 5 MG tablet Take 5 mg by mouth 3 (three) times daily.      . ergocalciferol (VITAMIN D2) 50000 UNITS capsule Take 50,000 Units by mouth once a week. Takes on Monday  12 capsule  1  . esomeprazole (NEXIUM) 40 MG capsule Take 40 mg by mouth daily.      Marland Kitchen FLUoxetine (PROZAC) 40 MG capsule Take 1 capsule (40 mg total) by mouth daily.  30 capsule  3  . furosemide (LASIX) 40 MG tablet Take 1 tablet (40 mg total) by mouth 2 (two) times daily.  60 tablet  2  . gabapentin (NEURONTIN) 800 MG tablet Take 800 mg by mouth 3 (three) times daily.       Marland Kitchen ibuprofen (ADVIL,MOTRIN) 800 MG tablet Take 1 tablet (800 mg total) by mouth 2 (two) times daily.  14 tablet  0  . lisinopril (PRINIVIL,ZESTRIL) 5 MG tablet Take 5 mg by mouth daily.       . metFORMIN (GLUCOPHAGE) 1000 MG tablet Take 1,000 mg by mouth 2 (two) times daily with a meal.      . nitroGLYCERIN (NITROLINGUAL) 0.4 MG/SPRAY spray Place 1 spray under the tongue every 5 (five) minutes as needed. For chest pain      . OxyCODONE (OXYCONTIN) 40 mg T12A 12 hr tablet Take 1 tablet (40 mg total) by mouth every 12 (twelve) hours.  60 tablet  0  . potassium chloride SA (K-DUR,KLOR-CON) 20 MEQ tablet Take 1 tablet (20 mEq total) by mouth 2 (two) times daily.  60 tablet  2  . PredniSONE  5 MG KIT As directed  21 each  0  . triamterene-hydrochlorothiazide (MAXZIDE-25) 37.5-25 MG per tablet Take 1 tablet by mouth daily.  30 tablet  3  . zolpidem (AMBIEN) 10 MG tablet Take 10 mg by mouth at bedtime.       . [DISCONTINUED] losartan-hydrochlorothiazide (HYZAAR) 50-12.5 MG per tablet TAKE 1 TABLET BY MOUTH ONCE DAILY *REPLACES DIOVAN/HCTZ*  30 tablet  0   No current facility-administered medications for this visit.     Past Surgical History  Procedure Laterality Date  . Cholecystectomy  1996  . Left knee arthroscopic surgery  1999  . Left salphingectomy secondary to ectopic pregnancy  1991  . Partial hysterectomy  1991  . Knee arthroscopy  10/04/2010,     right knee arthroscopy, dr Theda Sers  . Upper gastrointestinal endoscopy  2008 abd pain    H. pylori gastritis  . Upper gastrointestinal endoscopy  1996 [facility] [place]    PUD  . Colonoscopy  2000 [facility]    INT HEMORRHOIDS/FISSURE  . Colonoscopy  2003 [facility], CHANGE IN BOWEL HABITS    INT HEMORRHOIDS  . Colonoscopy  2006 [facility]    INT HEMORRHOIDS  . Colonoscopy  2007 [facility]    INT HEMORRHOIDS  . Esophagogastroduodenoscopy  2008    JGG:EZMOQ hiatal hernia./Normal esophagus without evidence of Barrett's mass, stricture, erosion or ulceration./Normal duodenal bulb and second portion of the duodenum./Diffuse erythema in the body and the antrum with occasional erosion.  Biopsies obtained via cold forceps to evaluate for H. pylori gastritis  . Back surgery  1993  . Back surgery  1994  . Back surgery  2002  . Back surgery  2011    [facility]   . Cystoscopy/retrograde/ureteroscopy  12/04/2011    Procedure: CYSTOSCOPY/RETROGRADE/URETEROSCOPY;  Surgeon: Molli Hazard, MD;  Location: [facility];  Service: Urology;  Laterality: Right;  . Abdominal hysterectomy    . Cystoscopy with stent placement Left 07/25/2012    Procedure: CYSTOSCOPY, left retograde pyelogram WITH left ureteral  STENT PLACEMENT;  Surgeon: Claybon Jabs, MD;  Location:  [facility];  Service: Urology;  Laterality: Left;  . Fracture surgery  1999    right clavicle  . Holmium laser application Left 01/02/7653    Procedure: HOLMIUM LASER APPLICATION;  Surgeon: Molli Hazard, MD;  Location: [facility];  Service: Urology;  Laterality:  Left;  . Cystoscopy/retrograde/ureteroscopy/stone extraction with basket Left 09/03/2012    Procedure: CYSTOSCOPY/RETROGRADE pyelogram/digital URETEROSCOPY/STONE EXTRACTION WITH BASKET, left stent removal;  Surgeon: Molli Hazard, MD;  Location: [facility];  Service: Urology;  Laterality: Left;     Allergies  Allergen Reactions  . Morphine Other (See Comments)    Reaction with esophagus, unable to swallow.   . Ace Inhibitors Cough  . Aspirin Other (See Comments)    Reaction with esophagus, unable to swallow.   . Losartan Cough  . Tomato Other (See Comments)    Acid reflux due to acid in tomato  . Adhesive [Tape] Rash      Family History  Problem Relation Age of Onset  . Heart disease Mother   . Hypertension Mother   . Cancer Mother     Cervical   . Heart attack Father   . Cancer Father     Prostate  . Colon polyps Neg Hx   . Colon cancer Neg Hx      Social History Angela [name] reports that she has never smoked. She has never used smokeless tobacco. Angela [name] reports that she does not drink alcohol.   Review of Systems CONSTITUTIONAL: No weight loss, fever, chills, weakness or fatigue.  HEENT: Eyes: No visual loss, blurred vision, double vision or yellow sclerae.No hearing loss, sneezing, congestion, runny nose or sore throat.  SKIN: No rash or itching.  CARDIOVASCULAR: per HPI RESPIRATORY: SOB GASTROINTESTINAL: No anorexia, nausea, vomiting or diarrhea. No abdominal pain or blood.  GENITOURINARY: No burning on urination, no polyuria NEUROLOGICAL: No headache, dizziness, syncope, paralysis, ataxia, numbness or tingling in the extremities. No change in bowel or bladder control.  MUSCULOSKELETAL: +LE  edema LYMPHATICS: No enlarged nodes. No history of splenectomy.  PSYCHIATRIC: No history of depression or anxiety.  ENDOCRINOLOGIC: No reports of sweating, cold or heat intolerance. No polyuria or polydipsia.  Marland Kitchen   Physical Examination p 85 bp 150/70 Wt 289 lbs BMI 50 Gen: resting comfortably, no acute distress HEENT: no scleral icterus, pupils equal round and reactive, no palptable cervical adenopathy,  CV: RRR, no m/r/g, no JVD, no carotid bruits Resp: Clear to auscultation bilaterally GI: abdomen is soft, non-tender, non-distended, normal bowel sounds, no hepatosplenomegaly MSK: extremities are warm, 1+ bilateral edema Skin: warm, no rash Neuro:  no focal deficits Psych: appropriate affect   Diagnostic Studies 11/2006 Cath FINDINGS: Left ventriculography showed good left ventricular systolic  function, EF of 55-60%. The left main was patent. LAD has 10-15% mid  stenosis and 30-40% distal diffuse stenosis. Diagonal one is small  which is patent. Diagonal 2 is very small which is patent. The left  circumflex is patent. High OM-1 has 10-15%  midstenosis. OM-2 is very, very small. OM-3 and OM-4 are small which  are patent. The RCA is patent. PDA is small. Posterolateral branches  are also very small which are patent. The patient tolerated the  procedure well. There were no complications. The patient was  transferred to recovery room in stable condition.     Assessment and Plan  1. CAD/Chest pain - prior non-obstructive CAD in 2008 by cath - has noted recent episodes of chest pain, along with increased DOE and LE edema - will obtain echo, if evidence of LV dysfunction will need cath. If not will need non-invasive ischemic evaluation  2. HTN - managed by pcp, recently her maxide was increased - continue to follow bp  3. Hyperlipidemia - on statin with repeat panel ordered, will follow  up results   Follow up pending echo results, possible ischemic  evaluation      Arnoldo Lenis, M.D., F.A.C.C.

## 2013-07-22 NOTE — Patient Instructions (Signed)
Your physician has requested that you have an echocardiogram. Echocardiography is a painless test that uses sound waves to create images of your heart. It provides your doctor with information about the size and shape of your heart and how well your heart's chambers and valves are working. This procedure takes approximately one hour. There are no restrictions for this procedure.  Your physician recommends that you continue on your current medications as directed. Please refer to the Current Medication list given to you today.  

## 2013-07-23 ENCOUNTER — Telehealth: Payer: Self-pay | Admitting: Cardiology

## 2013-07-23 NOTE — Telephone Encounter (Signed)
Echo scheduled for 07-29-13 at [facility]. Checking percert

## 2013-07-23 NOTE — Telephone Encounter (Signed)
Auth # [id number] exp 09/06/13

## 2013-07-24 ENCOUNTER — Other Ambulatory Visit: Payer: Self-pay | Admitting: Family Medicine

## 2013-07-24 ENCOUNTER — Other Ambulatory Visit: Payer: Self-pay

## 2013-07-24 ENCOUNTER — Inpatient Hospital Stay (HOSPITAL_COMMUNITY): Admission: RE | Admit: 2013-07-24 | Payer: Self-pay | Source: Ambulatory Visit | Admitting: Physical Therapy

## 2013-07-24 ENCOUNTER — Telehealth (HOSPITAL_COMMUNITY): Payer: Self-pay

## 2013-07-24 MED ORDER — OXYCODONE HCL ER 40 MG PO T12A: 40.0000 mg | EXTENDED_RELEASE_TABLET | Freq: Two times a day (BID) | ORAL | Status: DC

## 2013-07-28 ENCOUNTER — Other Ambulatory Visit: Payer: Self-pay

## 2013-07-28 ENCOUNTER — Telehealth: Payer: Self-pay

## 2013-07-28 ENCOUNTER — Ambulatory Visit (HOSPITAL_COMMUNITY)
Admission: RE | Admit: 2013-07-28 | Discharge: 2013-07-28 | Disposition: A | Discharge status: Home / Self Care | Payer: No Typology Code available for payment source | Source: Ambulatory Visit | Attending: Sports Medicine | Admitting: Sports Medicine

## 2013-07-28 DIAGNOSIS — M542 Cervicalgia: Secondary | ICD-10-CM

## 2013-07-28 DIAGNOSIS — IMO0001 Reserved for inherently not codable concepts without codable children: Secondary | ICD-10-CM | POA: Diagnosis not present

## 2013-07-28 MED ORDER — IBUPROFEN 800 MG PO TABS: 800.0000 mg | ORAL_TABLET | Freq: Two times a day (BID) | ORAL | Status: DC
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
## 2013-07-28 NOTE — Telephone Encounter (Signed)
Patient aware that pain rx is ready for collection and ibuprofen refill sent to pharmacy.

## 2013-07-29 ENCOUNTER — Ambulatory Visit (HOSPITAL_COMMUNITY): Admission: RE | Admit: 2013-07-29 | Payer: PRIVATE HEALTH INSURANCE | Source: Ambulatory Visit

## 2013-07-30 ENCOUNTER — Other Ambulatory Visit: Payer: Self-pay

## 2013-07-30 ENCOUNTER — Telehealth: Payer: Self-pay

## 2013-07-30 ENCOUNTER — Other Ambulatory Visit: Payer: Self-pay | Admitting: Family Medicine

## 2013-07-30 MED ORDER — ESOMEPRAZOLE MAGNESIUM 40 MG PO CPDR: 40.0000 mg | DELAYED_RELEASE_CAPSULE | Freq: Every day | ORAL | Status: DC

## 2013-07-30 MED ORDER — DIAZEPAM 5 MG PO TABS: 5.0000 mg | ORAL_TABLET | Freq: Three times a day (TID) | ORAL | Status: DC

## 2013-07-30 NOTE — Telephone Encounter (Signed)
meds refilled 

## 2013-07-31 ENCOUNTER — Ambulatory Visit (HOSPITAL_COMMUNITY)
Admission: RE | Admit: 2013-07-31 | Discharge: 2013-07-31 | Disposition: A | Discharge status: Home / Self Care | Payer: No Typology Code available for payment source | Source: Ambulatory Visit | Attending: Family Medicine | Admitting: Family Medicine

## 2013-07-31 DIAGNOSIS — M542 Cervicalgia: Secondary | ICD-10-CM | POA: Insufficient documentation

## 2013-07-31 DIAGNOSIS — I10 Essential (primary) hypertension: Secondary | ICD-10-CM | POA: Diagnosis not present

## 2013-07-31 DIAGNOSIS — E119 Type 2 diabetes mellitus without complications: Secondary | ICD-10-CM | POA: Insufficient documentation

## 2013-07-31 DIAGNOSIS — IMO0001 Reserved for inherently not codable concepts without codable children: Secondary | ICD-10-CM | POA: Insufficient documentation
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
## 2013-08-04 ENCOUNTER — Telehealth (HOSPITAL_COMMUNITY): Payer: Self-pay

## 2013-08-04 ENCOUNTER — Ambulatory Visit (HOSPITAL_COMMUNITY): Payer: Self-pay | Admitting: Physical Therapy

## 2013-08-04 ENCOUNTER — Encounter: Payer: Self-pay | Admitting: Cardiology

## 2013-08-06 ENCOUNTER — Other Ambulatory Visit: Payer: Self-pay

## 2013-08-06 MED ORDER — DIAZEPAM 5 MG PO TABS: 5.0000 mg | ORAL_TABLET | Freq: Three times a day (TID) | ORAL | Status: DC

## 2013-08-07 ENCOUNTER — Telehealth (HOSPITAL_COMMUNITY): Payer: Self-pay

## 2013-08-07 ENCOUNTER — Ambulatory Visit (HOSPITAL_COMMUNITY): Payer: Self-pay | Admitting: Physical Therapy

## 2013-08-11 ENCOUNTER — Ambulatory Visit (HOSPITAL_COMMUNITY): Payer: Self-pay

## 2013-08-14 ENCOUNTER — Ambulatory Visit (HOSPITAL_COMMUNITY): Payer: Self-pay | Admitting: Physical Therapy

## 2013-08-17 ENCOUNTER — Telehealth: Payer: Self-pay | Admitting: Family Medicine

## 2013-08-17 DIAGNOSIS — E119 Type 2 diabetes mellitus without complications: Secondary | ICD-10-CM

## 2013-08-17 DIAGNOSIS — I10 Essential (primary) hypertension: Secondary | ICD-10-CM

## 2013-08-17 NOTE — Assessment & Plan Note (Signed)
Uncontrolled, maxzide added DASH diet and commitment to daily physical activity for a minimum of 30 minutes discussed and encouraged, as a part of hypertension management. The importance of attaining a healthy weight is also discussed.

## 2013-08-17 NOTE — Telephone Encounter (Signed)
P;ls call pt, she needs appt  Either the week of 4/20, 4/27 or first week in May.(pls  sched once you contact her  Also)Co ordinate with narc collectin, in other words needs oV before or at next collection whichever is first) Current appt is too far out.  BP is uncontrolled, had recent med inc, has not kept appt for echo with cardiology which she needs to do She needs HBa1C , microalb and non fasting chem 7 and EGFR for the visit, her last HBa1C was in December.Add note needs foot exam at visit plso
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
## 2013-08-17 NOTE — Assessment & Plan Note (Signed)
Controlled, no change in medication Patient advised to reduce carb and sweets, commit to regular physical activity, take meds as prescribed, test blood as directed, and attempt to lose weight, to improve blood sugar control. Updated lab needed at/ before next visit.  

## 2013-08-18 NOTE — Addendum Note (Signed)
Addended by: Denman George B on: 08/18/2013 11:05 AM   Modules accepted: Orders

## 2013-08-18 NOTE — Telephone Encounter (Signed)
Attempted to reach patient.  Phone not in order.  Will put on waiting list for next week.

## 2013-08-19 ENCOUNTER — Ambulatory Visit (HOSPITAL_COMMUNITY)
Admission: RE | Admit: 2013-08-19 | Discharge: 2013-08-19 | Disposition: A | Discharge status: Home / Self Care | Payer: No Typology Code available for payment source | Source: Ambulatory Visit | Attending: Family Medicine | Admitting: Family Medicine

## 2013-08-19 DIAGNOSIS — IMO0001 Reserved for inherently not codable concepts without codable children: Secondary | ICD-10-CM | POA: Diagnosis not present

## 2013-08-19 DIAGNOSIS — M542 Cervicalgia: Secondary | ICD-10-CM
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
## 2013-08-21 ENCOUNTER — Inpatient Hospital Stay (HOSPITAL_COMMUNITY): Admission: RE | Admit: 2013-08-21 | Payer: Self-pay | Source: Ambulatory Visit

## 2013-08-21 NOTE — Telephone Encounter (Signed)
Called and left message for patient to return call.  

## 2013-08-22 ENCOUNTER — Other Ambulatory Visit: Payer: Self-pay

## 2013-08-22 MED ORDER — OXYCODONE HCL ER 40 MG PO T12A: 40.0000 mg | EXTENDED_RELEASE_TABLET | Freq: Two times a day (BID) | ORAL | Status: DC

## 2013-08-25 ENCOUNTER — Other Ambulatory Visit: Payer: Self-pay | Admitting: Family Medicine

## 2013-08-25 MED ORDER — OXYCODONE HCL ER 40 MG PO T12A: 40.0000 mg | EXTENDED_RELEASE_TABLET | Freq: Two times a day (BID) | ORAL | Status: DC

## 2013-08-25 NOTE — Telephone Encounter (Signed)
Patient aware labs ordered and faxed

## 2013-08-27 ENCOUNTER — Ambulatory Visit (INDEPENDENT_AMBULATORY_CARE_PROVIDER_SITE_OTHER): Payer: PRIVATE HEALTH INSURANCE | Admitting: Family Medicine

## 2013-08-27 ENCOUNTER — Encounter: Payer: Self-pay | Admitting: Family Medicine

## 2013-08-27 VITALS — BP 160/90 | HR 94 | Resp 18 | Wt 291.0 lb

## 2013-08-27 DIAGNOSIS — M5430 Sciatica, unspecified side: Secondary | ICD-10-CM

## 2013-08-27 DIAGNOSIS — I10 Essential (primary) hypertension: Secondary | ICD-10-CM

## 2013-08-27 DIAGNOSIS — E669 Obesity, unspecified: Secondary | ICD-10-CM

## 2013-08-27 DIAGNOSIS — M549 Dorsalgia, unspecified: Secondary | ICD-10-CM

## 2013-08-27 DIAGNOSIS — E119 Type 2 diabetes mellitus without complications: Secondary | ICD-10-CM

## 2013-08-27 DIAGNOSIS — E785 Hyperlipidemia, unspecified: Secondary | ICD-10-CM

## 2013-08-27 DIAGNOSIS — M5442 Lumbago with sciatica, left side: Secondary | ICD-10-CM

## 2013-08-27 LAB — MICROALBUMIN / CREATININE URINE RATIO
CREATININE, URINE: 259.5 mg/dL
MICROALB/CREAT RATIO: 6.8 mg/g (ref 0.0–30.0)
Microalb, Ur: 1.76 mg/dL (ref 0.00–1.89)

## 2013-08-27 LAB — COMPLETE METABOLIC PANEL WITH GFR
ALK PHOS: 76 U/L (ref 39–117)
ALT: 22 U/L (ref 0–35)
AST: 20 U/L (ref 0–37)
Albumin: 4.4 g/dL (ref 3.5–5.2)
BUN: 17 mg/dL (ref 6–23)
CALCIUM: 9.8 mg/dL (ref 8.4–10.5)
CHLORIDE: 100 meq/L (ref 96–112)
CO2: 29 mEq/L (ref 19–32)
CREATININE: 0.69 mg/dL (ref 0.50–1.10)
GFR, Est African American: >89 mL/min
GFR, Est Non African American: >89 mL/min
Glucose, Bld: 90 mg/dL (ref 70–99)
Potassium: 4 mEq/L (ref 3.5–5.3)
Sodium: 139 mEq/L (ref 135–145)
Total Bilirubin: 0.3 mg/dL (ref 0.2–1.2)
Total Protein: 7.9 g/dL (ref 6.0–8.3)

## 2013-08-27 LAB — HEMOGLOBIN A1C
HEMOGLOBIN A1C: 6.8 % — AB (ref <5.7)
Mean Plasma Glucose: 148 mg/dL — ABNORMAL HIGH (ref <117)

## 2013-08-27 MED ORDER — PREDNISONE 5 MG PO TABS: 5.0000 mg | ORAL_TABLET | Freq: Two times a day (BID) | ORAL | Status: DC

## 2013-08-27 MED ORDER — METHYLPREDNISOLONE ACETATE 80 MG/ML IJ SUSP
80.0000 mg | Freq: Once | INTRAMUSCULAR | Status: AC
  Administered 2013-08-27: 80 mg via INTRAMUSCULAR

## 2013-08-27 MED ORDER — KETOROLAC TROMETHAMINE 60 MG/2ML IM SOLN
60.0000 mg | Freq: Once | INTRAMUSCULAR | Status: AC
  Administered 2013-08-27: 60 mg via INTRAMUSCULAR

## 2013-08-27 MED ORDER — TRIAMTERENE-HCTZ 75-50 MG PO TABS: 1.0000 | ORAL_TABLET | Freq: Every day | ORAL | Status: DC
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
## 2013-09-03 ENCOUNTER — Ambulatory Visit: Payer: PRIVATE HEALTH INSURANCE | Admitting: Family Medicine

## 2013-09-04 ENCOUNTER — Encounter: Payer: Self-pay | Admitting: Cardiology

## 2013-09-09 ENCOUNTER — Other Ambulatory Visit: Payer: Self-pay | Admitting: Family Medicine

## 2013-09-11 ENCOUNTER — Ambulatory Visit: Payer: PRIVATE HEALTH INSURANCE | Admitting: Family Medicine

## 2013-09-12 ENCOUNTER — Other Ambulatory Visit: Payer: Self-pay | Admitting: Family Medicine

## 2013-09-19 ENCOUNTER — Other Ambulatory Visit: Payer: Self-pay

## 2013-09-19 MED ORDER — OXYCODONE HCL ER 40 MG PO T12A: 40.0000 mg | EXTENDED_RELEASE_TABLET | Freq: Two times a day (BID) | ORAL | Status: DC

## 2013-09-26 NOTE — Addendum Note (Signed)
Encounter addended by: Leia Alf, PT on: 09/26/2013 12:06 PM<BR>     Documentation filed: Episodes, Letters, Notes Section

## 2013-09-26 NOTE — Progress Notes (Signed)
Discharge Patient Details  Name: Angela [name] MRN: [id number] Date of Birth: 1961-04-06  Today's Date: 09/26/2013  Patient discharged from therapy due to non-compliance and not returning.   Angela [name] R Cliford Sequeira 09/26/2013, 12:03 PM

## 2013-09-30 ENCOUNTER — Other Ambulatory Visit: Payer: Self-pay | Admitting: Family Medicine

## 2013-10-01 ENCOUNTER — Other Ambulatory Visit: Payer: Self-pay

## 2013-10-01 ENCOUNTER — Other Ambulatory Visit (INDEPENDENT_AMBULATORY_CARE_PROVIDER_SITE_OTHER): Payer: PRIVATE HEALTH INSURANCE

## 2013-10-01 DIAGNOSIS — R0602 Shortness of breath: Secondary | ICD-10-CM

## 2013-10-01 DIAGNOSIS — R079 Chest pain, unspecified: Secondary | ICD-10-CM

## 2013-10-03 ENCOUNTER — Telehealth: Payer: Self-pay | Admitting: *Deleted

## 2013-10-03 ENCOUNTER — Other Ambulatory Visit: Payer: Self-pay

## 2013-10-03 MED ORDER — DIAZEPAM 5 MG PO TABS: 5.0000 mg | ORAL_TABLET | Freq: Three times a day (TID) | ORAL | Status: DC

## 2013-10-03 MED ORDER — ZOLPIDEM TARTRATE 10 MG PO TABS: 10.0000 mg | ORAL_TABLET | Freq: Every day | ORAL | Status: DC

## 2013-10-03 MED ORDER — IBUPROFEN 800 MG PO TABS
800.0000 mg | ORAL_TABLET | Freq: Two times a day (BID) | ORAL | Status: DC
Start: 2013-10-03 — End: 2013-12-23

## 2013-10-03 NOTE — Telephone Encounter (Signed)
No answer, will call back 

## 2013-10-08 ENCOUNTER — Other Ambulatory Visit: Payer: Self-pay | Admitting: Family Medicine

## 2013-10-13 NOTE — Progress Notes (Signed)
Thank you for update, please let her know that for now we will continue to monitor things. If she develops symptoms again (chest pain/SOB) we will likely need to obtain a stress test. She should see me back in 2- 3 months   Zandra Abts MD

## 2013-10-15 ENCOUNTER — Other Ambulatory Visit: Payer: Self-pay

## 2013-10-15 MED ORDER — OXYCODONE HCL ER 40 MG PO T12A: 40.0000 mg | EXTENDED_RELEASE_TABLET | Freq: Two times a day (BID) | ORAL | Status: DC

## 2013-10-16 ENCOUNTER — Telehealth: Payer: Self-pay | Admitting: *Deleted

## 2013-10-16 NOTE — Telephone Encounter (Signed)
Patient called inquiring about the status of her cardiac clearance.  See your result notes.

## 2013-10-17 NOTE — Telephone Encounter (Signed)
From my review patient was reviewed by Dr Moshe Cipro for   "Symptomatic, has h/o CAD and has not had cardiology eval in 2 years will establish with new local provider" which was the focus of our new patient visit. I am not aware of any preoperative clearance, please find out more information. If needed I would like for her to see me in clinic to talk about is specifically.    J [employer]

## 2013-10-20 NOTE — Telephone Encounter (Signed)
No answer

## 2013-10-21 NOTE — Telephone Encounter (Signed)
Patient notified.  OV scheduled this Thursday (10/23/2013) at 3:20 with Dr. Harl Bowie for cardiac clearance for left knee scope.

## 2013-10-23 ENCOUNTER — Encounter: Payer: Self-pay | Admitting: Cardiology

## 2013-10-23 ENCOUNTER — Ambulatory Visit (INDEPENDENT_AMBULATORY_CARE_PROVIDER_SITE_OTHER): Payer: PRIVATE HEALTH INSURANCE | Admitting: Cardiology

## 2013-10-23 VITALS — BP 149/84 | HR 78 | Ht 64.0 in | Wt 294.0 lb

## 2013-10-23 DIAGNOSIS — Z01810 Encounter for preprocedural cardiovascular examination: Secondary | ICD-10-CM

## 2013-10-23 NOTE — Progress Notes (Signed)
Clinical Summary Ms. Connon is a 53 y.o.female seen today for follow up of the following medical problems.   1. CAD/Chest pain  - prior history of chest pain, referred for cath in 2008 with non-obstructive disease  - starting in Feb dull pain under left breast, 8/10. +Nauseous, + SOB, + diaphoresis. Can occur at rest or with exertion. Not positional. Lasts up to 30 minutes, feels fatigued after. Occurs 4 times a day. Stable in freuquency and severity since onset. Different from pain in 2008.  - noticed increased swelling in legs over same time period. Has noticed some increasing DOE over the same time period.  CAD risk factors: HTN, HL, DM, mother MI 74s   - last referred in setting of symptoms referred for echo which showed normal LV systolic function, LVEF 62-83% and grade I diastolic dysfunction. - since last visit no recurrent symptoms     2. Preoperative evaluation - patient being considered for possible  left knee ortho surgery. At time of original consultation our office was not made aware of this, and thus the patient was brought back today to discuss in detail  - her exercise tolerance is  limited by lower back and left knee pain. Sedentary lifestyle due to these pains, highest level of exertion is walking short distances, no higher levels of activity.  - reports problem during urology surgery last year where her bp dropped significantly during the procedure, unable to find this documentation in the reviewed record      Past Medical History  Diagnosis Date  . Low back pain   . Helicobacter pylori gastritis 2008  . Severe obesity (BMI >= 40)   . GERD (gastroesophageal reflux disease)   . H/O hiatal hernia   . Anxiety   . Depression   . Asthma   . Essential hypertension, benign   . Hypercholesterolemia   . Degenerative disc disease   . Dysrhythmia   . Sleep apnea     STOP BANG SCORE 6no cpap used  . Migraine   . Arthritis   . Type 2 diabetes mellitus with  diabetic neuropathy   . Nephrolithiasis     Recurring episodes since 2004  . Hyperthyroidism     s/p radiation  . Coronary atherosclerosis of native coronary artery     Mild nonobstructive CAD by cardiac catheterization 2008 - Dr. Terrence Dupont     Allergies  Allergen Reactions  . Morphine Other (See Comments)    Reaction with esophagus, unable to swallow.   . Ace Inhibitors Cough  . Aspirin Other (See Comments)    Reaction with esophagus, unable to swallow.   . Losartan Cough  . Tomato Other (See Comments)    Acid reflux due to acid in tomato  . Adhesive [Tape] Rash     Current Outpatient Prescriptions  Medication Sig Dispense Refill  . albuterol (PROAIR HFA) 108 (90 BASE) MCG/ACT inhaler Inhale 2 puffs into the lungs every 6 (six) hours as needed for wheezing or shortness of breath.      . cloNIDine (CATAPRES) 0.3 MG tablet TAKE ONE TABLET AT 9 PM EVERY NIGHT FOR HIGH BLOOD PRESSURE  30 tablet  4  . cloNIDine (CATAPRES) 0.3 MG tablet TAKE ONE TABLET AT 9 PM EVERY NIGHT FOR HIGH BLOOD PRESSURE  30 tablet  2  . cyclobenzaprine (FLEXERIL) 10 MG tablet Take 1 tablet (10 mg total) by mouth 3 (three) times daily as needed for muscle spasms.  60 tablet  1  .  diazepam (VALIUM) 5 MG tablet Take 1 tablet (5 mg total) by mouth 3 (three) times daily.  90 tablet  1  . ergocalciferol (VITAMIN D2) 50000 UNITS capsule Take 50,000 Units by mouth once a week. Takes on Monday  12 capsule  1  . esomeprazole (NEXIUM) 40 MG capsule Take 1 capsule (40 mg total) by mouth daily.  30 capsule  2  . FLUoxetine (PROZAC) 40 MG capsule TAKE ONE CAPSULE BY MOUTH EVERY DAY  30 capsule  3  . furosemide (LASIX) 40 MG tablet TAKE 1 TABLET (40 MG TOTAL) BY MOUTH 2 (TWO) TIMES DAILY.  60 tablet  2  . gabapentin (NEURONTIN) 800 MG tablet TAKE 1 TABLET BY MOUTH 4 TIMES A DAY  120 tablet  3  . gabapentin (NEURONTIN) 800 MG tablet TAKE 1 TABLET BY MOUTH 4 TIMES A DAY  120 tablet  2  . ibuprofen (ADVIL,MOTRIN) 800 MG tablet  Take 1 tablet (800 mg total) by mouth 2 (two) times daily.  30 tablet  0  . lisinopril (PRINIVIL,ZESTRIL) 5 MG tablet TAKE 1 TABLET BY MOUTH EVERY DAY  30 tablet  3  . lisinopril (PRINIVIL,ZESTRIL) 5 MG tablet TAKE 1 TABLET BY MOUTH EVERY DAY  30 tablet  2  . metFORMIN (GLUCOPHAGE) 1000 MG tablet Take 1,000 mg by mouth 2 (two) times daily with a meal.      . nitroGLYCERIN (NITROLINGUAL) 0.4 MG/SPRAY spray Place 1 spray under the tongue every 5 (five) minutes as needed. For chest pain      . OxyCODONE (OXYCONTIN) 40 mg T12A 12 hr tablet Take 1 tablet (40 mg total) by mouth every 12 (twelve) hours.  60 tablet  0  . potassium chloride SA (K-DUR,KLOR-CON) 20 MEQ tablet Take 1 tablet (20 mEq total) by mouth 2 (two) times daily.  60 tablet  2  . predniSONE (DELTASONE) 5 MG tablet Take 1 tablet (5 mg total) by mouth 2 (two) times daily with a meal.  10 tablet  0  . triamterene-hydrochlorothiazide (MAXZIDE) 75-50 MG per tablet Take 1 tablet by mouth daily.  30 tablet  4  . zolpidem (AMBIEN) 10 MG tablet Take 1 tablet (10 mg total) by mouth at bedtime.  30 tablet  2  . [DISCONTINUED] losartan-hydrochlorothiazide (HYZAAR) 50-12.5 MG per tablet TAKE 1 TABLET BY MOUTH ONCE DAILY *REPLACES DIOVAN/HCTZ*  30 tablet  0   No current facility-administered medications for this visit.     Past Surgical History  Procedure Laterality Date  . Cholecystectomy  1996  . Left knee arthroscopic surgery  1999  . Left salphingectomy secondary to ectopic pregnancy  1991  . Partial hysterectomy  1991  . Knee arthroscopy  10/04/2010,     right knee arthroscopy, dr Theda Sers  . Upper gastrointestinal endoscopy  2008 abd pain    H. pylori gastritis  . Upper gastrointestinal endoscopy  1996 [facility] [place]    PUD  . Colonoscopy  2000 [facility]    INT HEMORRHOIDS/FISSURE  . Colonoscopy  2003 [facility], CHANGE IN BOWEL HABITS    INT HEMORRHOIDS  . Colonoscopy  2006 [facility]    INT HEMORRHOIDS  . Colonoscopy  2007 [facility]    INT  HEMORRHOIDS  . Esophagogastroduodenoscopy  2008    GNF:AOZHY hiatal hernia./Normal esophagus without evidence of Barrett's mass, stricture, erosion or ulceration./Normal duodenal bulb and second portion of the duodenum./Diffuse erythema in the body and the antrum with occasional erosion.  Biopsies obtained via cold forceps to evaluate for H. pylori  gastritis  . Back surgery  1993  . Back surgery  1994  . Back surgery  2002  . Back surgery  2011    [facility]   . Cystoscopy/retrograde/ureteroscopy  12/04/2011    Procedure: CYSTOSCOPY/RETROGRADE/URETEROSCOPY;  Surgeon: Molli Hazard, MD;  Location: [facility];  Service: Urology;  Laterality: Right;  . Abdominal hysterectomy    . Cystoscopy with stent placement Left 07/25/2012    Procedure: CYSTOSCOPY, left retograde pyelogram WITH left ureteral  STENT PLACEMENT;  Surgeon: Claybon Jabs, MD;  Location: [facility];  Service: Urology;  Laterality: Left;  . Fracture surgery  1999    right clavicle  . Holmium laser application Left 10/06/1243    Procedure: HOLMIUM LASER APPLICATION;  Surgeon: Molli Hazard, MD;  Location: [facility];  Service: Urology;  Laterality: Left;  . Cystoscopy/retrograde/ureteroscopy/stone extraction with basket Left 09/03/2012    Procedure: CYSTOSCOPY/RETROGRADE pyelogram/digital URETEROSCOPY/STONE EXTRACTION WITH BASKET, left stent removal;  Surgeon: Molli Hazard, MD;  Location: [facility];  Service: Urology;  Laterality: Left;     Allergies  Allergen Reactions  . Morphine Other (See Comments)    Reaction with esophagus, unable to swallow.   . Ace Inhibitors Cough  . Aspirin Other (See Comments)    Reaction with esophagus, unable to swallow.   . Losartan Cough  . Tomato Other (See Comments)    Acid reflux due to acid in tomato  . Adhesive [Tape] Rash      Family History  Problem Relation Age of Onset  . Heart disease Mother   . Hypertension Mother   . Cancer Mother     Cervical   . Heart attack Father     . Cancer Father     Prostate  . Colon polyps Neg Hx   . Colon cancer Neg Hx      Social History Ms. Delbuono reports that she has never smoked. She has never used smokeless tobacco. Ms. Kalp reports that she does not drink alcohol.   Review of Systems CONSTITUTIONAL: No weight loss, fever, chills, weakness or fatigue.  HEENT: Eyes: No visual loss, blurred vision, double vision or yellow sclerae.No hearing loss, sneezing, congestion, runny nose or sore throat.  SKIN: No rash or itching.  CARDIOVASCULAR: per HPI RESPIRATORY: No shortness of breath, cough or sputum.  GASTROINTESTINAL: No anorexia, nausea, vomiting or diarrhea. No abdominal pain or blood.  GENITOURINARY: No burning on urination, no polyuria NEUROLOGICAL: No headache, dizziness, syncope, paralysis, ataxia, numbness or tingling in the extremities. No change in bowel or bladder control.  MUSCULOSKELETAL: left knee pain, back pain LYMPHATICS: No enlarged nodes. No history of splenectomy.  PSYCHIATRIC: No history of depression or anxiety.  ENDOCRINOLOGIC: No reports of sweating, cold or heat intolerance. No polyuria or polydipsia.  Marland Kitchen   Physical Examination p 78 bp 149/84 Wt 294 lbs BMI 50  No physiacal exam was performed this visit   Diagnostic Studies 11/2006 Cath  FINDINGS: Left ventriculography showed good left ventricular systolic  function, EF of 55-60%. The left main was patent. LAD has 10-15% mid  stenosis and 30-40% distal diffuse stenosis. Diagonal one is small  which is patent. Diagonal 2 is very small which is patent. The left  circumflex is patent. High OM-1 has 10-15%  midstenosis. OM-2 is very, very small. OM-3 and OM-4 are small which  are patent. The RCA is patent. PDA is small. Posterolateral branches  are also very small which are patent. The patient tolerated the  procedure well. There  were no complications. The patient was  transferred to recovery room in stable condition.   09/2013  Echo Study Conclusions  - Left ventricle: The cavity size was normal. Wall thickness was increased in a pattern of mild LVH. Systolic function was normal. The estimated ejection fraction was in the range of 60% to 65%. Wall motion was normal; there were no regional wall motion abnormalities. Doppler parameters are consistent with abnormal left ventricular relaxation (grade 1 diastolic dysfunction). - Aortic valve: Mildly calcified annulus. Trileaflet. There was no significant regurgitation. - Mitral valve: There was trivial regurgitation. - Right atrium: Central venous pressure (est): 3 mm Hg. - Tricuspid valve: There was trivial regurgitation. - Pulmonary arteries: Systolic pressure could not be accurately estimated. - Pericardium, extracardiac: Hypolucent regions noted within the liver, consistent with hepatic cysts. Consider focused abdominal imaging to further evaluate. There was no pericardial effusion.  Impressions:  - Mild LVH, septal predominance, with LVEF 60-65% and grade 1 diastolic dysfunction. Trivial tricuspid regurgitation, unable to assess PASP. Hypolucent regions noted within the liver, consistent with hepatic cysts. Consider focused abdominal imaging to further evaluate.    Assessment and Plan  1. CAD/Chest pain  - prior non-obstructive CAD in 2008 by cath  - had noted recent episodes of chest pain, along with increased DOE and LE edema  - echo with no significant pathology - symptoms have currently resolved  2. Preoperative evaluation - patient is being considered for a moderate risk orthopedic procedure. She has no current active acute cardiac conditions, but has had chest pain episodes on and off for the last few months - unable to assess her functional capacity by history, she is limited by chronic knee and back pain - discussed indications for stress testing for better risk stratification given her history of early CAD from prior cath in 2008 with recent  episodes of chest pain, and inability to assess her functional capacity by history. She also apparently had some hypotension and troubles during a urological procedure last year she reports that is not overly clear from the reviewed notes. - she is not interested in stress testing at this time, and wishes to follow up with her prior cardiologist Dr Montez Morita for evaluation. She is frusterated about getting her surgery done soon. Her echo was ordered in 06/2013 however she no showed for one study and despite letters of reminders did not have study completed until 09/2013. We also were not made aware of any surgery or needed clearance until just a few days ago.   She may contact or follow up with our office as needed  3. Liver cysts - incidental finding on recent echo, from prior imaging including CT scan last year these have been recognized and have been stable.        Arnoldo Lenis, M.D., F.A.C.C.

## 2013-10-23 NOTE — Patient Instructions (Signed)
Continue all current medications. Follow up as needed  

## 2013-10-27 ENCOUNTER — Other Ambulatory Visit: Payer: Self-pay | Admitting: Family Medicine

## 2013-10-27 DIAGNOSIS — Z139 Encounter for screening, unspecified: Secondary | ICD-10-CM

## 2013-11-05 ENCOUNTER — Other Ambulatory Visit: Payer: Self-pay | Admitting: Family Medicine

## 2013-11-05 ENCOUNTER — Telehealth: Payer: Self-pay | Admitting: Family Medicine

## 2013-11-05 NOTE — Telephone Encounter (Signed)
Wants to know will the Dr call her in some mystatin  triamclnob cream for rash under breast has been up sister and she had used her hers and it works will you call it in

## 2013-11-06 NOTE — Telephone Encounter (Signed)
Please advise 

## 2013-11-06 NOTE — Telephone Encounter (Signed)
Will addres

## 2013-11-07 MED ORDER — NYSTATIN-TRIAMCINOLONE 100000-0.1 UNIT/GM-% EX OINT: 1.0000 "application " | TOPICAL_OINTMENT | Freq: Two times a day (BID) | CUTANEOUS | Status: DC

## 2013-11-07 NOTE — Telephone Encounter (Signed)
pls let pt know that med has been sent in

## 2013-11-07 NOTE — Telephone Encounter (Signed)
Patient aware.

## 2013-11-10 ENCOUNTER — Ambulatory Visit (HOSPITAL_COMMUNITY)
Admission: RE | Admit: 2013-11-10 | Discharge: 2013-11-10 | Disposition: A | Discharge status: Home / Self Care | Payer: PRIVATE HEALTH INSURANCE | Source: Ambulatory Visit | Attending: Family Medicine | Admitting: Family Medicine

## 2013-11-10 DIAGNOSIS — Z139 Encounter for screening, unspecified: Secondary | ICD-10-CM

## 2013-11-10 DIAGNOSIS — Z1231 Encounter for screening mammogram for malignant neoplasm of breast: Secondary | ICD-10-CM | POA: Insufficient documentation

## 2013-11-14 ENCOUNTER — Other Ambulatory Visit: Payer: Self-pay

## 2013-11-14 MED ORDER — OXYCODONE HCL ER 40 MG PO T12A: 40.0000 mg | EXTENDED_RELEASE_TABLET | Freq: Two times a day (BID) | ORAL | Status: DC

## 2013-11-19 ENCOUNTER — Other Ambulatory Visit: Payer: Self-pay | Admitting: Family Medicine

## 2013-11-26 ENCOUNTER — Ambulatory Visit: Payer: PRIVATE HEALTH INSURANCE | Admitting: Family Medicine

## 2013-12-12 ENCOUNTER — Other Ambulatory Visit: Payer: Self-pay | Admitting: Family Medicine

## 2013-12-15 ENCOUNTER — Other Ambulatory Visit: Payer: Self-pay | Admitting: Family Medicine

## 2013-12-16 ENCOUNTER — Other Ambulatory Visit: Payer: Self-pay | Admitting: Family Medicine

## 2013-12-16 ENCOUNTER — Ambulatory Visit: Payer: PRIVATE HEALTH INSURANCE | Admitting: Family Medicine

## 2013-12-17 ENCOUNTER — Other Ambulatory Visit: Payer: Self-pay

## 2013-12-17 MED ORDER — DIAZEPAM 5 MG PO TABS: 5.0000 mg | ORAL_TABLET | Freq: Three times a day (TID) | ORAL | Status: DC

## 2013-12-18 ENCOUNTER — Other Ambulatory Visit: Payer: Self-pay

## 2013-12-18 ENCOUNTER — Other Ambulatory Visit: Payer: Self-pay | Admitting: Family Medicine

## 2013-12-18 MED ORDER — OXYCODONE HCL ER 40 MG PO T12A: 40.0000 mg | EXTENDED_RELEASE_TABLET | Freq: Two times a day (BID) | ORAL | Status: DC

## 2013-12-19 ENCOUNTER — Other Ambulatory Visit: Payer: Self-pay | Admitting: Family Medicine

## 2013-12-20 LAB — COMPLETE METABOLIC PANEL WITH GFR
ALK PHOS: 63 U/L (ref 39–117)
ALT: 18 U/L (ref 0–35)
AST: 19 U/L (ref 0–37)
Albumin: 4.5 g/dL (ref 3.5–5.2)
BUN: 14 mg/dL (ref 6–23)
CALCIUM: 9.4 mg/dL (ref 8.4–10.5)
CHLORIDE: 100 meq/L (ref 96–112)
CO2: 28 mEq/L (ref 19–32)
CREATININE: 0.69 mg/dL (ref 0.50–1.10)
GFR, Est African American: >89 mL/min
GFR, Est Non African American: >89 mL/min
Glucose, Bld: 90 mg/dL (ref 70–99)
Potassium: 3.8 mEq/L (ref 3.5–5.3)
Sodium: 136 mEq/L (ref 135–145)
Total Bilirubin: 0.6 mg/dL (ref 0.2–1.2)
Total Protein: 7.5 g/dL (ref 6.0–8.3)

## 2013-12-20 LAB — LIPID PANEL
CHOLESTEROL: 159 mg/dL (ref 0–200)
HDL: 46 mg/dL (ref >39)
LDL Cholesterol: 95 mg/dL (ref 0–99)
TRIGLYCERIDES: 88 mg/dL (ref <150)
Total CHOL/HDL Ratio: 3.5 Ratio
VLDL: 18 mg/dL (ref 0–40)

## 2013-12-20 LAB — HEMOGLOBIN A1C
Hgb A1c MFr Bld: 6.7 % — ABNORMAL HIGH (ref <5.7)
Mean Plasma Glucose: 146 mg/dL — ABNORMAL HIGH (ref <117)

## 2013-12-23 ENCOUNTER — Ambulatory Visit (INDEPENDENT_AMBULATORY_CARE_PROVIDER_SITE_OTHER): Payer: PRIVATE HEALTH INSURANCE | Admitting: Family Medicine

## 2013-12-23 ENCOUNTER — Encounter: Payer: Self-pay | Admitting: Family Medicine

## 2013-12-23 VITALS — BP 150/78 | HR 86 | Resp 18 | Ht 65.5 in | Wt 285.1 lb

## 2013-12-23 DIAGNOSIS — F3289 Other specified depressive episodes: Secondary | ICD-10-CM

## 2013-12-23 DIAGNOSIS — E119 Type 2 diabetes mellitus without complications: Secondary | ICD-10-CM

## 2013-12-23 DIAGNOSIS — E785 Hyperlipidemia, unspecified: Secondary | ICD-10-CM

## 2013-12-23 DIAGNOSIS — J0110 Acute frontal sinusitis, unspecified: Secondary | ICD-10-CM

## 2013-12-23 DIAGNOSIS — I10 Essential (primary) hypertension: Secondary | ICD-10-CM

## 2013-12-23 DIAGNOSIS — E669 Obesity, unspecified: Secondary | ICD-10-CM

## 2013-12-23 DIAGNOSIS — J209 Acute bronchitis, unspecified: Secondary | ICD-10-CM | POA: Insufficient documentation

## 2013-12-23 DIAGNOSIS — F329 Major depressive disorder, single episode, unspecified: Secondary | ICD-10-CM

## 2013-12-23 DIAGNOSIS — J208 Acute bronchitis due to other specified organisms: Secondary | ICD-10-CM

## 2013-12-23 DIAGNOSIS — K219 Gastro-esophageal reflux disease without esophagitis: Secondary | ICD-10-CM

## 2013-12-23 MED ORDER — LEVOFLOXACIN 750 MG PO TABS: 750.0000 mg | ORAL_TABLET | Freq: Every day | ORAL | Status: DC

## 2013-12-23 MED ORDER — SPIRONOLACTONE 50 MG PO TABS: 50.0000 mg | ORAL_TABLET | Freq: Every day | ORAL | Status: DC

## 2013-12-23 MED ORDER — FLUCONAZOLE 150 MG PO TABS: ORAL_TABLET | ORAL | Status: DC

## 2013-12-23 NOTE — Progress Notes (Signed)
   Subjective:    [name], female    DOB: [birth year], 53 y.o.   MRN: [id number]  HPI  The PT is here for follow up and re-evaluation of chronic medical conditions, medication management and review of any available recent lab and radiology data.  Preventive health is updated, specifically  Cancer screening and Immunization.   Questions or concerns regarding consultations or procedures which the PT has had in the interim are  2 week h/o chest congestion which is worsening , sputum is thick and yellow and she has had chills Facial pressure, green bloody nasal drainage Denies polyuria, polydipsia or hypoglycemic episodes   Review of Systems See HPI Denies chest pains, palpitations and leg swelling Denies abdominal pain, nausea, vomiting,diarrhea or constipation.   Denies dysuria, frequency, hesitancy or incontinence. Denies uncontrolled joint pain, swelling and limitation in mobility. Denies headaches, seizures, numbness, or tingling. Denies uncontrolled  depression, anxiety or insomnia. Denies skin break down or rash.        Objective:   Physical Exam BP 150/78  Pulse 86  Resp 18  Ht 5' 5.5" (1.664 m)  Wt 285 lb 1.9 oz (129.33 kg)  BMI 46.71 kg/m2  SpO2 100% Patient alert and oriented and in no cardiopulmonary distress.  HEENT: No facial asymmetry, EOMI,   oropharynx pink and moist.  Neck supple no JVD, no mass. Frontal sinus pressure TM clear , anterior cervical adenitis  Chest: Cdecreased though adequate air entry , bilateral crackles no wheezes  CVS: S1, S2 no murmurs, no S3.Regular rate.  ABD: Soft non tender.   Ext: No edema  MS: Adequate ROM spine, shoulders, hips and knees.  Skin: Intact, no ulcerations or rash noted.  Psych: Good eye contact, normal affect. Memory intact not anxious or depressed appearing.  CNS: CN 2-12 intact, power,  normal throughout.no focal deficits noted.        Assessment & Plan:  HYPERTENSION Uncontrolled,  add spironolactone to current regime DASH diet and commitment to daily physical activity for a minimum of 30 minutes discussed and encouraged, as a part of hypertension management. The importance of attaining a healthy weight is also discussed. Recheck October 1, nurse visit  Acute bronchitis Antibiotic and decongestant prescribed  GERD (gastroesophageal reflux disease) Controlled, no change in medication   DIABETES MELLITUS, TYPE II Controlled, no change in medication Patient advised to reduce carb and sweets, commit to regular physical activity, take meds as prescribed, test blood as directed, and attempt to lose weight, to improve blood sugar control.   DEPRESSION Doing better, continue current medication, has also seen therapist which she found very beneficial  HYPERLIPIDEMIA Controlled, no change in medication Hyperlipidemia:Low fat diet discussed and encouraged.    OBESITY Improved. Pt applauded on succesful weight loss through lifestyle change, and encouraged to continue same. Weight loss goal set for the next several months.   Acute frontal sinusitis 10 day antibiotic course prescribed

## 2013-12-23 NOTE — Assessment & Plan Note (Addendum)
Uncontrolled, add spironolactone to current regime DASH diet and commitment to daily physical activity for a minimum of 30 minutes discussed and encouraged, as a part of hypertension management. The importance of attaining a healthy weight is also discussed. Recheck October 1, nurse visit

## 2013-12-23 NOTE — Patient Instructions (Addendum)
F/u for nurse BP check Oct 1,  Call if you need me before  New additional med for BP is spironolactone 50mg  one  Daily  Continue clonidine and maxzide as before  Medication sent in for acute bronchitis and sinusitis   Non fasting chem 7 Sept 30 pls  Use OTC vit D3 one capsule once daily

## 2013-12-24 ENCOUNTER — Other Ambulatory Visit: Payer: Self-pay

## 2013-12-24 DIAGNOSIS — I10 Essential (primary) hypertension: Secondary | ICD-10-CM

## 2013-12-24 MED ORDER — POTASSIUM CHLORIDE CRYS ER 20 MEQ PO TBCR: 20.0000 meq | EXTENDED_RELEASE_TABLET | Freq: Two times a day (BID) | ORAL | Status: DC

## 2013-12-24 MED ORDER — GABAPENTIN 800 MG PO TABS: ORAL_TABLET | ORAL | Status: DC

## 2013-12-24 MED ORDER — ESOMEPRAZOLE MAGNESIUM 40 MG PO CPDR: DELAYED_RELEASE_CAPSULE | ORAL | Status: DC

## 2013-12-24 MED ORDER — FUROSEMIDE 40 MG PO TABS: ORAL_TABLET | ORAL | Status: DC

## 2013-12-24 MED ORDER — FLUOXETINE HCL 40 MG PO CAPS: ORAL_CAPSULE | ORAL | Status: DC

## 2013-12-24 MED ORDER — TRIAMTERENE-HCTZ 75-50 MG PO TABS: 1.0000 | ORAL_TABLET | Freq: Every day | ORAL | Status: DC

## 2013-12-24 MED ORDER — CLONIDINE HCL 0.3 MG PO TABS: ORAL_TABLET | ORAL | Status: DC

## 2013-12-31 ENCOUNTER — Telehealth: Payer: Self-pay

## 2013-12-31 DIAGNOSIS — K7689 Other specified diseases of liver: Secondary | ICD-10-CM

## 2013-12-31 NOTE — Telephone Encounter (Signed)
Will refer to GI of choice.

## 2014-01-01 NOTE — Telephone Encounter (Signed)
Agree pls let pt know , I will sign the referral

## 2014-01-02 NOTE — Addendum Note (Signed)
Addended by: Denman George B on: 01/02/2014 11:48 AM   Modules accepted: Orders

## 2014-01-02 NOTE — Telephone Encounter (Signed)
Spoke with patient and she would like see if Dr. Laural Golden can see her before Nov.

## 2014-01-02 NOTE — Telephone Encounter (Signed)
Called and left message for patient to return call.  

## 2014-01-02 NOTE — Telephone Encounter (Signed)
Referral entered  

## 2014-01-06 ENCOUNTER — Telehealth: Payer: Self-pay | Admitting: Family Medicine

## 2014-01-07 NOTE — Telephone Encounter (Signed)
Patient states that she received letter from Dr. Olevia Perches office.  Given # to call and schedule appt

## 2014-01-12 ENCOUNTER — Other Ambulatory Visit: Payer: Self-pay | Admitting: Family Medicine

## 2014-01-13 DIAGNOSIS — J0110 Acute frontal sinusitis, unspecified: Secondary | ICD-10-CM | POA: Insufficient documentation

## 2014-01-13 NOTE — Assessment & Plan Note (Signed)
Doing better, continue current medication, has also seen therapist which she found very beneficial

## 2014-01-13 NOTE — Assessment & Plan Note (Signed)
10 day antibiotic course prescribed 

## 2014-01-13 NOTE — Assessment & Plan Note (Signed)
Controlled, no change in medication Hyperlipidemia:Low fat diet discussed and encouraged.  \ 

## 2014-01-13 NOTE — Assessment & Plan Note (Signed)
Antibiotic and decongestant prescribed 

## 2014-01-13 NOTE — Assessment & Plan Note (Signed)
Controlled, no change in medication  

## 2014-01-13 NOTE — Assessment & Plan Note (Signed)
Improved. Pt applauded on succesful weight loss through lifestyle change, and encouraged to continue same. Weight loss goal set for the next several months.  

## 2014-01-13 NOTE — Assessment & Plan Note (Signed)
Controlled, no change in medication Patient advised to reduce carb and sweets, commit to regular physical activity, take meds as prescribed, test blood as directed, and attempt to lose weight, to improve blood sugar control.  

## 2014-01-14 ENCOUNTER — Other Ambulatory Visit: Payer: Self-pay

## 2014-01-14 MED ORDER — OXYCODONE HCL ER 40 MG PO T12A: 40.0000 mg | EXTENDED_RELEASE_TABLET | Freq: Two times a day (BID) | ORAL | Status: DC

## 2014-01-14 NOTE — Assessment & Plan Note (Signed)
Less well controlled but no need for med change  Patient advised to reduce carb and sweets, commit to regular physical activity, take meds as prescribed, test blood as directed, and attempt to lose weight, to improve blood sugar control.

## 2014-01-14 NOTE — Assessment & Plan Note (Signed)
Increased and uncontrolled radiating to left leg, anti inflammatories in office followed by short course of prednisone

## 2014-01-14 NOTE — Progress Notes (Signed)
   Subjective:    [name], female    DOB: 19-Oct-1960, 53 y.o.   MRN: [id number]  HPI The PT is here for follow up and re-evaluation of chronic medical conditions, medication management and review of any available recent lab and radiology data.  Preventive health is updated, specifically  Cancer screening and Immunization.   Questions or concerns regarding consultations or procedures which the PT has had in the interim are  Addressed.Recently involved in [place], reports increased knee pain and instability, requests sleeve to partially immobilize joint , however I advise that she get definitive management of the problem from ortho treating her for the accident. The PT denies any adverse reactions to current medications since the last visit.  C/o increased and uncontrolled back pain for the past 3 days radiaitng down left leg, requests injections in office at this visit to help with pain relief Unable to stick with lifestyle change to  Facilitate weight loss and improved control of her chronic illnesses but will continue to work on this       Review of Systems See HPI Denies recent fever or chills. Denies sinus pressure, nasal congestion, ear pain or sore throat. Denies chest congestion, productive cough or wheezing. Denies chest pains, palpitations and leg swelling Denies abdominal pain, nausea, vomiting,diarrhea or constipation.   Denies dysuria, frequency, hesitancy or incontinence.  Denies uncontrolled depression, anxiety or insomnia. Denies skin break down or rash.        Objective:   Physical Exam BP 160/90  Pulse 94  Resp 18  Wt 291 lb (131.997 kg)  SpO2 94% Patient alert and oriented and in no cardiopulmonary distress.Pt in pain  HEENT: No facial asymmetry, EOMI,   oropharynx pink and moist.  Neck supple no JVD, no mass.  Chest: Clear to auscultation bilaterally.  CVS: S1, S2 no murmurs, no S3.Regular rate.  ABD: Soft non tender.   Ext: No  edema  MS: Decreased  ROM spine,, hips and knees.  Skin: Intact, no ulcerations or rash noted.  Psych: Good eye contact, normal affect. Memory intact not anxious or depressed appearing.  CNS: CN 2-12 intact, power,  normal throughout.no focal deficits noted.        Assessment & Plan:

## 2014-01-14 NOTE — Assessment & Plan Note (Signed)
Uncontrolled, increase dose of medication DASH diet and commitment to daily physical activity for a minimum of 30 minutes discussed and encouraged, as a part of hypertension management. The importance of attaining a healthy weight is also discussed.

## 2014-01-14 NOTE — Assessment & Plan Note (Signed)
Deteriorated. Patient re-educated about  the importance of commitment to a  minimum of 150 minutes of exercise per week. The importance of healthy food choices with portion control discussed. Encouraged to start a food diary, count calories and to consider  joining a support group. Sample diet sheets offered. Goals set by the patient for the next several months.    

## 2014-01-14 NOTE — Assessment & Plan Note (Signed)
LDL not at goal of 70 opr less. Hyperlipidemia:Low fat diet discussed and encouraged.  May need to add additional med if number remains elevated when next checked Updated lab needed at/ before next visit.

## 2014-01-21 ENCOUNTER — Encounter: Payer: Self-pay | Admitting: Gastroenterology

## 2014-01-22 ENCOUNTER — Telehealth: Payer: Self-pay | Admitting: Family Medicine

## 2014-01-26 NOTE — Telephone Encounter (Signed)
Angela [name]

## 2014-01-27 ENCOUNTER — Emergency Department (HOSPITAL_COMMUNITY)
Admission: EM | Admit: 2014-01-27 | Discharge: 2014-01-27 | Disposition: A | Discharge status: Home / Self Care | Payer: PRIVATE HEALTH INSURANCE | Attending: Emergency Medicine | Admitting: Emergency Medicine

## 2014-01-27 ENCOUNTER — Emergency Department (HOSPITAL_COMMUNITY): Payer: PRIVATE HEALTH INSURANCE

## 2014-01-27 ENCOUNTER — Encounter (HOSPITAL_COMMUNITY): Payer: Self-pay | Admitting: Emergency Medicine

## 2014-01-27 DIAGNOSIS — J45909 Unspecified asthma, uncomplicated: Secondary | ICD-10-CM | POA: Diagnosis not present

## 2014-01-27 DIAGNOSIS — Z8739 Personal history of other diseases of the musculoskeletal system and connective tissue: Secondary | ICD-10-CM | POA: Insufficient documentation

## 2014-01-27 DIAGNOSIS — E1142 Type 2 diabetes mellitus with diabetic polyneuropathy: Secondary | ICD-10-CM | POA: Diagnosis not present

## 2014-01-27 DIAGNOSIS — S46909A Unspecified injury of unspecified muscle, fascia and tendon at shoulder and upper arm level, unspecified arm, initial encounter: Secondary | ICD-10-CM | POA: Diagnosis present

## 2014-01-27 DIAGNOSIS — Z8619 Personal history of other infectious and parasitic diseases: Secondary | ICD-10-CM | POA: Diagnosis not present

## 2014-01-27 DIAGNOSIS — Y9301 Activity, walking, marching and hiking: Secondary | ICD-10-CM | POA: Diagnosis not present

## 2014-01-27 DIAGNOSIS — Z79899 Other long term (current) drug therapy: Secondary | ICD-10-CM | POA: Insufficient documentation

## 2014-01-27 DIAGNOSIS — G43909 Migraine, unspecified, not intractable, without status migrainosus: Secondary | ICD-10-CM | POA: Insufficient documentation

## 2014-01-27 DIAGNOSIS — W010XXA Fall on same level from slipping, tripping and stumbling without subsequent striking against object, initial encounter: Secondary | ICD-10-CM | POA: Diagnosis not present

## 2014-01-27 DIAGNOSIS — K219 Gastro-esophageal reflux disease without esophagitis: Secondary | ICD-10-CM | POA: Insufficient documentation

## 2014-01-27 DIAGNOSIS — Y929 Unspecified place or not applicable: Secondary | ICD-10-CM | POA: Insufficient documentation

## 2014-01-27 DIAGNOSIS — E039 Hypothyroidism, unspecified: Secondary | ICD-10-CM | POA: Insufficient documentation

## 2014-01-27 DIAGNOSIS — I10 Essential (primary) hypertension: Secondary | ICD-10-CM | POA: Diagnosis not present

## 2014-01-27 DIAGNOSIS — I2510 Atherosclerotic heart disease of native coronary artery without angina pectoris: Secondary | ICD-10-CM | POA: Diagnosis not present

## 2014-01-27 DIAGNOSIS — E669 Obesity, unspecified: Secondary | ICD-10-CM | POA: Diagnosis not present

## 2014-01-27 DIAGNOSIS — IMO0002 Reserved for concepts with insufficient information to code with codable children: Secondary | ICD-10-CM | POA: Insufficient documentation

## 2014-01-27 DIAGNOSIS — E1149 Type 2 diabetes mellitus with other diabetic neurological complication: Secondary | ICD-10-CM | POA: Diagnosis not present

## 2014-01-27 DIAGNOSIS — S43401A Unspecified sprain of right shoulder joint, initial encounter: Secondary | ICD-10-CM

## 2014-01-27 DIAGNOSIS — S4980XA Other specified injuries of shoulder and upper arm, unspecified arm, initial encounter: Secondary | ICD-10-CM | POA: Insufficient documentation

## 2014-01-27 MED ORDER — HYDROMORPHONE HCL 1 MG/ML IJ SOLN
1.0000 mg | Freq: Once | INTRAMUSCULAR | Status: AC
  Administered 2014-01-27: 1 mg via INTRAMUSCULAR
  Filled 2014-01-27: qty 1

## 2014-01-27 MED ORDER — PROMETHAZINE HCL 25 MG/ML IJ SOLN
25.0000 mg | Freq: Once | INTRAMUSCULAR | Status: AC
  Administered 2014-01-27: 25 mg via INTRAMUSCULAR
  Filled 2014-01-27: qty 1

## 2014-01-27 NOTE — ED Notes (Signed)
PT stated she fell onto the stepping stones outside of her house last pm and c/o right shoulder and right knee pain.

## 2014-01-27 NOTE — ED Provider Notes (Signed)
CSN: [id number]     Arrival date & time 01/27/14  0901 History   First MD Initiated Contact with Patient 01/27/14 [phone]     Chief Complaint  Patient presents with  . Fall     (Consider location/radiation/quality/duration/timing/severity/associated sxs/prior Treatment) The history is provided by the patient.   Angela [name] is a 53 y.o. female presenting with pain in her right shoulder since tripping and falling around midnight as she was walking across her stepping stones.  She fell on her left knee then onto her right side sustaining persistent pain in the shoulder despite taking her chronic oxycontin 40 mg (took after the fall last night, has not taken yet today).  Her pain is worsened with movement.  She denies numbness or weakness distal to the injury site.  She denies neck, head, elbow wrist pain and has no increased pain in her knees.  She reports chronic severe djd in the left knee and is anticipating a total knee surgery once she is medically cleared.      Past Medical History  Diagnosis Date  . Low back pain   . Helicobacter pylori gastritis 2008  . Severe obesity (BMI >= 40)   . GERD (gastroesophageal reflux disease)   . H/O hiatal hernia   . Anxiety   . Depression   . Asthma   . Essential hypertension, benign   . Hypercholesterolemia   . Degenerative disc disease   . Dysrhythmia   . Sleep apnea     STOP BANG SCORE 6no cpap used  . Migraine   . Arthritis   . Type 2 diabetes mellitus with diabetic neuropathy   . Nephrolithiasis     Recurring episodes since 2004  . Hyperthyroidism     s/p radiation  . Coronary atherosclerosis of native coronary artery     Mild nonobstructive CAD by cardiac catheterization 2008 - Dr. Terrence Dupont   Past Surgical History  Procedure Laterality Date  . Cholecystectomy  1996  . Left knee arthroscopic surgery  1999  . Left salphingectomy secondary to ectopic pregnancy  1991  . Partial hysterectomy  1991  . Knee arthroscopy  10/04/2010,      right knee arthroscopy, dr Theda Sers  . Upper gastrointestinal endoscopy  2008 abd pain    H. pylori gastritis  . Upper gastrointestinal endoscopy  1996 [facility] [place]    PUD  . Colonoscopy  2000 [facility]    INT HEMORRHOIDS/FISSURE  . Colonoscopy  2003 [facility], CHANGE IN BOWEL HABITS    INT HEMORRHOIDS  . Colonoscopy  2006 [facility]    INT HEMORRHOIDS  . Colonoscopy  2007 [facility]    INT HEMORRHOIDS  . Esophagogastroduodenoscopy  2008    YKD:XIPJA hiatal hernia./Normal esophagus without evidence of Barrett's mass, stricture, erosion or ulceration./Normal duodenal bulb and second portion of the duodenum./Diffuse erythema in the body and the antrum with occasional erosion.  Biopsies obtained via cold forceps to evaluate for H. pylori gastritis  . Back surgery  1993  . Back surgery  1994  . Back surgery  2002  . Back surgery  2011    [facility]   . Cystoscopy/retrograde/ureteroscopy  12/04/2011    Procedure: CYSTOSCOPY/RETROGRADE/URETEROSCOPY;  Surgeon: Molli Hazard, MD;  Location: [facility];  Service: Urology;  Laterality: Right;  . Abdominal hysterectomy    . Cystoscopy with stent placement Left 07/25/2012    Procedure: CYSTOSCOPY, left retograde pyelogram WITH left ureteral  STENT PLACEMENT;  Surgeon: Claybon Jabs, MD;  Location: [facility];  Service: Urology;  Laterality: Left;  . Fracture surgery  1999    right clavicle  . Holmium laser application Left 05/07/735    Procedure: HOLMIUM LASER APPLICATION;  Surgeon: Molli Hazard, MD;  Location: [facility];  Service: Urology;  Laterality: Left;  . Cystoscopy/retrograde/ureteroscopy/stone extraction with basket Left 09/03/2012    Procedure: CYSTOSCOPY/RETROGRADE pyelogram/digital URETEROSCOPY/STONE EXTRACTION WITH BASKET, left stent removal;  Surgeon: Molli Hazard, MD;  Location: [facility];  Service: Urology;  Laterality: Left;   Family History  Problem Relation Age of Onset  . Heart disease Mother   . Hypertension Mother   . Cancer Mother      Cervical   . Heart attack Father   . Cancer Father     Prostate  . Colon polyps Neg Hx   . Colon cancer Neg Hx    History  Substance Use Topics  . Smoking status: Never Smoker   . Smokeless tobacco: Never Used  . Alcohol Use: No   OB History   Grav Para Term Preterm Abortions TAB SAB Ect Mult Living                 Review of Systems  Constitutional: Negative for fever.  Musculoskeletal: Positive for arthralgias and joint swelling. Negative for myalgias.  Neurological: Negative for weakness and numbness.      Allergies  Morphine; Ace inhibitors; Aspirin; Losartan; Tomato; and Adhesive  Home Medications   Prior to Admission medications   Medication Sig Start Date End Date Taking? Authorizing Provider  albuterol (PROAIR HFA) 108 (90 BASE) MCG/ACT inhaler Inhale 2 puffs into the lungs every 6 (six) hours as needed for wheezing or shortness of breath.    Historical Provider, MD  atorvastatin (LIPITOR) 40 MG tablet TAKE 1 TABLET EVERY DAY 12/22/13   Fayrene Helper, MD  cloNIDine (CATAPRES) 0.3 MG tablet TAKE ONE TABLET AT 9 PM EVERY NIGHT FOR HIGH BLOOD PRESSURE 12/24/13   Fayrene Helper, MD  cyclobenzaprine (FLEXERIL) 10 MG tablet Take 1 tablet (10 mg total) by mouth 3 (three) times daily as needed for muscle spasms. 07/15/13   Fayrene Helper, MD  diazepam (VALIUM) 5 MG tablet TAKE 1 TABLET 3 TIMES A DAY 12/23/13   Fayrene Helper, MD  ergocalciferol (VITAMIN D2) 50000 UNITS capsule Take 50,000 Units by mouth once a week. Takes on Monday 04/04/12   Fayrene Helper, MD  esomeprazole (NEXIUM) 40 MG capsule TAKE 1 CAPSULE (40 MG TOTAL) BY MOUTH DAILY. 12/24/13   Fayrene Helper, MD  fluconazole (DIFLUCAN) 150 MG tablet One tablet once daily, as needed, for vaginal itch due to antibiotics 12/23/13   Fayrene Helper, MD  FLUoxetine (PROZAC) 40 MG capsule TAKE ONE CAPSULE BY MOUTH EVERY DAY 12/24/13   Fayrene Helper, MD  furosemide (LASIX) 40 MG tablet TAKE 1  TABLET (40 MG TOTAL) BY MOUTH 2 (TWO) TIMES DAILY. 12/24/13   Fayrene Helper, MD  gabapentin (NEURONTIN) 800 MG tablet TAKE 1 TABLET BY MOUTH 4 TIMES A DAY 12/24/13   Fayrene Helper, MD  KLOR-CON M20 20 MEQ tablet TAKE 1 TABLET (20 MEQ TOTAL) BY MOUTH 2 (TWO) TIMES DAILY. 01/12/14   Fayrene Helper, MD  levofloxacin (LEVAQUIN) 750 MG tablet Take 1 tablet (750 mg total) by mouth daily. 12/23/13   Fayrene Helper, MD  metFORMIN (GLUCOPHAGE) 1000 MG tablet Take 1,000 mg by mouth 2 (two) times daily with a meal.    Historical Provider, MD  nitroGLYCERIN (NITROLINGUAL) 0.4 MG/SPRAY spray Place 1 spray under the tongue every 5 (five) minutes as needed. For chest pain    Historical Provider, MD  nystatin-triamcinolone ointment (MYCOLOG) Apply 1 application topically 2 (two) times daily. 11/07/13   Fayrene Helper, MD  OxyCODONE (OXYCONTIN) 40 mg T12A 12 hr tablet Take 1 tablet (40 mg total) by mouth every 12 (twelve) hours. 01/14/14   Fayrene Helper, MD  potassium chloride SA (K-DUR,KLOR-CON) 20 MEQ tablet Take 1 tablet (20 mEq total) by mouth 2 (two) times daily. 12/24/13   Fayrene Helper, MD  spironolactone (ALDACTONE) 50 MG tablet Take 1 tablet (50 mg total) by mouth daily. 12/23/13   Fayrene Helper, MD  triamterene-hydrochlorothiazide (MAXZIDE) 75-50 MG per tablet Take 1 tablet by mouth daily. 12/24/13   Fayrene Helper, MD  zolpidem (AMBIEN) 10 MG tablet Take 1 tablet (10 mg total) by mouth at bedtime. 10/03/13   Fayrene Helper, MD   BP 155/98  Pulse 78  Temp(Src) 97.7 F (36.5 C) (Oral)  Resp 18  Ht 5\' 4"  (1.626 m)  Wt 282 lb (127.914 kg)  BMI 48.38 kg/m2  SpO2 99% Physical Exam  Constitutional: She appears well-developed and well-nourished.  HENT:  Head: Atraumatic.  Neck: Normal range of motion.  Cardiovascular:  Pulses equal bilaterally  Musculoskeletal: She exhibits tenderness.       Right shoulder: She exhibits decreased range of motion and bony tenderness.  She exhibits no swelling, no effusion, no crepitus, no spasm and normal pulse.  No obvious swelling, difficult to determine due to body habitus.  Distal sensation intact.  No pain in the forearm, elbow, wrist.  Grip strength full,  Radial pulse full.    Neurological: She is alert. She has normal strength. She displays normal reflexes. No sensory deficit.  Skin: Skin is warm and dry.  Psychiatric: She has a normal mood and affect.    ED Course  Procedures (including critical care time) Labs Review Labs Reviewed - No data to display  Imaging Review Dg Shoulder Right  01/27/2014   CLINICAL DATA:  Fall, right shoulder injury  EXAM: RIGHT SHOULDER - 2+ VIEW  COMPARISON:  05/14/2009  FINDINGS: Limited exam because of positioning. No gross malalignment or fracture. Preserved joint spaces. No significant arthropathy.  IMPRESSION: No acute osseous finding   Electronically Signed   By: Daryll Brod M.D.   On: 01/27/2014 09:47     EKG Interpretation None      MDM   Final diagnoses:  Shoulder sprain, right, initial encounter    Patients labs and/or radiological studies were viewed and considered during the medical decision making and disposition process.  Pt was placed in sling for comfort.  Advised ice, rest, f/u with her orthopedist if sx are not improving over the next week.  Cannot rule out cartilage or rotator injury, which was discussed with pt.      Evalee Jefferson, PA-C 01/27/14 1053

## 2014-01-27 NOTE — Discharge Instructions (Signed)
Shoulder Sprain °A shoulder sprain is the result of damage to the tough, fiber-like tissues (ligaments) that help hold your shoulder in place. The ligaments may be stretched or torn. Besides the main shoulder joint (the ball and socket), there are several smaller joints that connect the bones in this area. A sprain usually involves one of those joints. Most often it is the acromioclavicular (or AC) joint. That is the joint that connects the collarbone (clavicle) and the shoulder blade (scapula) at the top point of the shoulder blade (acromion). °A shoulder sprain is a mild form of what is called a shoulder separation. Recovering from a shoulder sprain may take some time. For some, pain lingers for several months. Most people recover without long term problems. °CAUSES  °· A shoulder sprain is usually caused by some kind of trauma. This might be: °¨ Falling on an outstretched arm. °¨ Being hit hard on the shoulder. °¨ Twisting the arm. °· Shoulder sprains are more likely to occur in people who: °¨ Play sports. °¨ Have balance or coordination problems. °SYMPTOMS  °· Pain when you move your shoulder. °· Limited ability to move the shoulder. °· Swelling and tenderness on top of the shoulder. °· Redness or warmth in the shoulder. °· Bruising. °· A change in the shape of the shoulder. °DIAGNOSIS  °Your healthcare provider may: °· Ask about your symptoms. °· Ask about recent activity that might have caused those symptoms. °· Examine your shoulder. You may be asked to do simple exercises to test movement. The other shoulder will be examined for comparison. °· Order some tests that provide a look inside the body. They can show the extent of the injury. The tests could include: °¨ X-rays. °¨ CT (computed tomography) scan. °¨ MRI (magnetic resonance imaging) scan. °RISKS AND COMPLICATIONS °· Loss of full shoulder motion. °· Ongoing shoulder pain. °TREATMENT  °How long it takes to recover from a shoulder sprain depends on how  severe it was. Treatment options may include: °· Rest. You should not use the arm or shoulder until it heals. °· Ice. For 2 or 3 days after the injury, put an ice pack on the shoulder up to 4 times a day. It should stay on for 15 to 20 minutes each time. Wrap the ice in a towel so it does not touch your skin. °· Over-the-counter medicine to relieve pain. °· A sling or brace. This will keep the arm still while the shoulder is healing. °· Physical therapy or rehabilitation exercises. These will help you regain strength and motion. Ask your healthcare provider when it is OK to begin these exercises. °· Surgery. The need for surgery is rare with a sprained shoulder, but some people may need surgery to keep the joint in place and reduce pain. °HOME CARE INSTRUCTIONS  °· Ask your healthcare provider about what you should and should not do while your shoulder heals. °· Make sure you know how to apply ice to the correct area of your shoulder. °· Talk with your healthcare provider about which medications should be used for pain and swelling. °· If rehabilitation therapy will be needed, ask your healthcare provider to refer you to a therapist. If it is not recommended, then ask about at-home exercises. Find out when exercise should begin. °SEEK MEDICAL CARE IF:  °Your pain, swelling, or redness at the joint increases. °SEEK IMMEDIATE MEDICAL CARE IF:  °· You have a fever. °· You cannot move your arm or shoulder. °Document Released: 09/03/2008 Document   Revised: 07/10/2011 Document Reviewed: 09/03/2008 Exit[employer] Patient Information 2015 [place], [place]. This information is not intended to replace advice given to you by your health care provider. Make sure you discuss any questions you have with your health care provider.   Wear your sling for comfort.  Apply ice to your shoulder as much as is comfortable for the next few days.  Call Dr. Theda Sers for further evaluation if your symptoms are not improving over the next week.

## 2014-01-27 NOTE — ED Notes (Signed)
PT seen for chronic back pain and took a 40mg  OxyContin last pm for pain from fall.

## 2014-01-27 NOTE — ED Provider Notes (Signed)
Medical screening examination/treatment/procedure(s) were performed by non-physician practitioner and as supervising physician I was immediately available for consultation/collaboration.   EKG Interpretation None        Sharyon Cable, MD 01/27/14 1200

## 2014-02-03 ENCOUNTER — Ambulatory Visit (INDEPENDENT_AMBULATORY_CARE_PROVIDER_SITE_OTHER): Payer: PRIVATE HEALTH INSURANCE

## 2014-02-03 ENCOUNTER — Telehealth: Payer: Self-pay | Admitting: Family Medicine

## 2014-02-03 VITALS — BP 158/90

## 2014-02-03 DIAGNOSIS — I10 Essential (primary) hypertension: Secondary | ICD-10-CM

## 2014-02-03 DIAGNOSIS — M25511 Pain in right shoulder: Secondary | ICD-10-CM

## 2014-02-03 MED ORDER — KETOROLAC TROMETHAMINE 60 MG/2ML IM SOLN
60.0000 mg | Freq: Once | INTRAMUSCULAR | Status: AC
  Administered 2014-02-03: 60 mg via INTRAMUSCULAR
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
## 2014-02-03 NOTE — Progress Notes (Signed)
Patient in for nurse visit.   Blood pressure checked manually and verified by MD.  Orders to increase Spironolactone to 100mg . Patient aware that she can take 2 50mg  until she runs out.  New order placed for increased dose.  Patient to have chem7 on 02/12/14.  Lab order given and patient aware.

## 2014-02-03 NOTE — Telephone Encounter (Signed)
Noted.  Patient given injection. Given order for labs.

## 2014-02-03 NOTE — Addendum Note (Signed)
Addended by: Denman George B on: 02/03/2014 10:42 AM   Modules accepted: Orders

## 2014-02-07 MED ORDER — SPIRONOLACTONE 100 MG PO TABS: 50.0000 mg | ORAL_TABLET | Freq: Every day | ORAL | Status: DC

## 2014-02-07 NOTE — Addendum Note (Signed)
Addended by: Denman George B on: 02/07/2014 10:01 PM   Modules accepted: Orders

## 2014-02-13 ENCOUNTER — Other Ambulatory Visit: Payer: Self-pay

## 2014-02-13 MED ORDER — OXYCODONE HCL ER 40 MG PO T12A: 40.0000 mg | EXTENDED_RELEASE_TABLET | Freq: Two times a day (BID) | ORAL | Status: DC

## 2014-02-18 ENCOUNTER — Other Ambulatory Visit: Payer: Self-pay

## 2014-02-18 MED ORDER — ATORVASTATIN CALCIUM 40 MG PO TABS: ORAL_TABLET | ORAL | Status: DC

## 2014-03-02 ENCOUNTER — Telehealth: Payer: Self-pay

## 2014-03-02 NOTE — Telephone Encounter (Signed)
Pls type a letter stating that in the next 12 weeks effective 11/2 /2015, pt may work for a maximum of 24 hours job must not entail lifting over 5 pounds , no standing for over 10 minutes , no bending

## 2014-03-03 ENCOUNTER — Other Ambulatory Visit: Payer: Self-pay

## 2014-03-03 MED ORDER — DIAZEPAM 5 MG PO TABS: ORAL_TABLET | ORAL | Status: DC

## 2014-03-03 NOTE — Telephone Encounter (Signed)
Pt stated that she needed arthroscopy but financially she needed to work before she could get the surgery. 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 She assured me that her fall risk on the job is non existent and that she is not required to do anything but sit

## 2014-03-03 NOTE — Telephone Encounter (Signed)
Noted and patient in to collect letter.  Declined flu shot at this time due to having cold symptoms.

## 2014-03-05 ENCOUNTER — Encounter: Payer: PRIVATE HEALTH INSURANCE | Admitting: Gastroenterology

## 2014-03-05 ENCOUNTER — Encounter: Payer: Self-pay | Admitting: Gastroenterology

## 2014-03-05 NOTE — Progress Notes (Signed)
   Subjective:    [name], female    DOB: 08/17/1960, 53 y.o.   MRN: [id number]  HPI   Past Medical History  Diagnosis Date  . Low back pain   . Helicobacter pylori gastritis 2008  . Severe obesity (BMI >= 40)   . GERD (gastroesophageal reflux disease)   . H/O hiatal hernia   . Anxiety   . Depression   . Asthma   . Essential hypertension, benign   . Hypercholesterolemia   . Degenerative disc disease   . Dysrhythmia   . Sleep apnea     STOP BANG SCORE 6no cpap used  . Migraine   . Arthritis   . Type 2 diabetes mellitus with diabetic neuropathy   . Nephrolithiasis     Recurring episodes since 2004  . Hyperthyroidism     s/p radiation  . Coronary atherosclerosis of native coronary artery     Mild nonobstructive CAD by cardiac catheterization 2008 - Dr. Terrence Dupont    Review of Systems     Objective:   Physical Exam        Assessment & Plan:

## 2014-03-12 ENCOUNTER — Other Ambulatory Visit: Payer: Self-pay

## 2014-03-12 MED ORDER — OXYCODONE HCL ER 40 MG PO T12A: 40.0000 mg | EXTENDED_RELEASE_TABLET | Freq: Two times a day (BID) | ORAL | Status: DC

## 2014-03-19 ENCOUNTER — Ambulatory Visit (INDEPENDENT_AMBULATORY_CARE_PROVIDER_SITE_OTHER): Payer: PRIVATE HEALTH INSURANCE

## 2014-03-19 VITALS — Wt 283.0 lb

## 2014-03-19 DIAGNOSIS — M5442 Lumbago with sciatica, left side: Secondary | ICD-10-CM

## 2014-03-19 MED ORDER — KETOROLAC TROMETHAMINE 60 MG/2ML IM SOLN
60.0000 mg | Freq: Once | INTRAMUSCULAR | Status: AC
  Administered 2014-03-19: 60 mg via INTRAMUSCULAR

## 2014-03-19 NOTE — Progress Notes (Signed)
Ok to get toradol 60 IM per Dr. Patient walking with a cane but was having a hard time so I wheeled pt to her car and advised if no better she should do to the ER. Patient agrees

## 2014-04-07 ENCOUNTER — Other Ambulatory Visit: Payer: Self-pay | Admitting: Family Medicine

## 2014-04-10 ENCOUNTER — Other Ambulatory Visit: Payer: Self-pay

## 2014-04-10 MED ORDER — OXYCODONE HCL ER 40 MG PO T12A: 40.0000 mg | EXTENDED_RELEASE_TABLET | Freq: Two times a day (BID) | ORAL | Status: DC

## 2014-04-11 ENCOUNTER — Other Ambulatory Visit: Payer: Self-pay | Admitting: Family Medicine

## 2014-04-13 ENCOUNTER — Ambulatory Visit (INDEPENDENT_AMBULATORY_CARE_PROVIDER_SITE_OTHER): Payer: PRIVATE HEALTH INSURANCE | Admitting: Family Medicine

## 2014-04-13 ENCOUNTER — Encounter: Payer: Self-pay | Admitting: Family Medicine

## 2014-04-13 VITALS — BP 180/86 | HR 76 | Resp 18 | Ht 64.0 in | Wt 284.1 lb

## 2014-04-13 DIAGNOSIS — M5090 Cervical disc disorder, unspecified, unspecified cervical region: Secondary | ICD-10-CM | POA: Insufficient documentation

## 2014-04-13 DIAGNOSIS — F418 Other specified anxiety disorders: Secondary | ICD-10-CM

## 2014-04-13 DIAGNOSIS — E119 Type 2 diabetes mellitus without complications: Secondary | ICD-10-CM

## 2014-04-13 DIAGNOSIS — E785 Hyperlipidemia, unspecified: Secondary | ICD-10-CM

## 2014-04-13 DIAGNOSIS — K219 Gastro-esophageal reflux disease without esophagitis: Secondary | ICD-10-CM

## 2014-04-13 DIAGNOSIS — I10 Essential (primary) hypertension: Secondary | ICD-10-CM

## 2014-04-13 MED ORDER — AMLODIPINE BESYLATE 5 MG PO TABS: 5.0000 mg | ORAL_TABLET | Freq: Every day | ORAL | Status: DC

## 2014-04-13 NOTE — Assessment & Plan Note (Addendum)
Uncontrolled, add amlodipine 5mg  DASH diet and commitment to daily physical activity for a minimum of 30 minutes discussed and encouraged, as a part of hypertension management. The importance of attaining a healthy weight is also discussed. Close f/u in 4 weeks prior to surgery

## 2014-04-13 NOTE — Progress Notes (Signed)
Subjective:    [name], female    DOB: [birth year], 53 y.o.   MRN: [id number]  HPI The PT is here for follow up and re-evaluation of chronic medical conditions, medication management and review of any available recent lab and radiology data.  Preventive health is updated, specifically  Cancer screening and Immunization.   Questions or concerns regarding consultations or procedures which the PT has had in the interim are  addressed. The PT denies any adverse reactions to current medications since the last visit.  C/o increased and uncontrolled dneck pain radiating to upper extremities. Has upcoming shoulder surgey in January, marked limitation in shoulder mobility Denies polyuria, polydipsia, blurred vision , or hypoglycemic episodes.     Review of Systems See HPI Denies recent fever or chills. Denies sinus pressure, nasal congestion, ear pain or sore throat. Denies chest congestion, productive cough or wheezing. Denies chest pains, palpitations and leg swelling Denies abdominal pain, nausea, vomiting,diarrhea or constipation.   Denies dysuria, frequency, hesitancy or incontinence. Chronic severe and worsening  joint pain,  and limitation in mobility. Denies headaches, seizures, does have extremity  numbness, and  tingling. Denies uncontrolled  depression, anxiety or insomnia. Denies skin break down or rash.        Objective:   Physical Exam BP 180/86 mmHg  Pulse 76  Resp 18  Ht 5\' 4"  (1.626 m)  Wt 284 lb 1.9 oz (128.876 kg)  BMI 48.75 kg/m2  SpO2 96% Patient alert and oriented and in no cardiopulmonary distress.  HEENT: No facial asymmetry, EOMI,   oropharynx pink and moist.  Neck decreased ROM no JVD, no mass.  Chest: Clear to auscultation bilaterally.  CVS: S1, S2 no murmurs, no S3.Regular rate.  ABD: Soft non tender.   Ext: No edema  MS: decreased  ROM spine, shoulders, hips and knees.  Skin: Intact, no ulcerations or rash noted.  Psych:  Good eye contact, normal affect. Memory intact not anxious or depressed appearing.  CNS: CN 2-12 intact, power,  normal throughout.no focal deficits noted.        Assessment & Plan:  Essential hypertension Uncontrolled, add amlodipine 5mg  DASH diet and commitment to daily physical activity for a minimum of 30 minutes discussed and encouraged, as a part of hypertension management. The importance of attaining a healthy weight is also discussed. Close f/u in 4 weeks prior to surgery   Diabetes mellitus without complication Controlled, no change in medication Patient advised to reduce carb and sweets, commit to regular physical activity, take meds as prescribed, test blood as directed, and attempt to lose weight, to improve blood sugar control. Updated lab needed at/ before next visit.    Hyperlipidemia LDL goal <100 Controlled, no change in medication Hyperlipidemia:Low fat diet discussed and encouraged.  Updated lab needed at/ before next visit.     Morbid obesity unchanged Patient re-educated about  the importance of commitment to a  minimum of 150 minutes of exercise per week. The importance of healthy food choices with portion control discussed. Encouraged to start a food diary, count calories and to consider  joining a support group. Sample diet sheets offered. Goals set by the patient for the next several months.      Depression with anxiety Controlled, no change in medication    GERD (gastroesophageal reflux disease) Controlled, no change in medication    Cervical neck pain with evidence of disc disease Increased pain with radiation to upper extremity , needs MRI c spine to  further evaluate

## 2014-04-13 NOTE — Patient Instructions (Addendum)
F/u in 4 weeks,call if you need me before  New additional medication amlodipine 5mg  one daily for uncontrolled bP  No changes in pain med  You are referred for eye exam with Dr Lattie Corns are   Referred for MRI of neck

## 2014-04-16 ENCOUNTER — Ambulatory Visit (HOSPITAL_COMMUNITY): Payer: PRIVATE HEALTH INSURANCE

## 2014-04-18 ENCOUNTER — Other Ambulatory Visit: Payer: Self-pay | Admitting: Family Medicine

## 2014-04-27 ENCOUNTER — Ambulatory Visit (HOSPITAL_COMMUNITY): Payer: PRIVATE HEALTH INSURANCE

## 2014-05-05 ENCOUNTER — Other Ambulatory Visit: Payer: Self-pay

## 2014-05-05 MED ORDER — ESOMEPRAZOLE MAGNESIUM 40 MG PO CPDR: DELAYED_RELEASE_CAPSULE | ORAL | Status: DC

## 2014-05-07 ENCOUNTER — Other Ambulatory Visit: Payer: Self-pay | Admitting: Family Medicine

## 2014-05-07 ENCOUNTER — Other Ambulatory Visit: Payer: Self-pay

## 2014-05-07 MED ORDER — OXYCODONE HCL ER 40 MG PO T12A: 40.0000 mg | EXTENDED_RELEASE_TABLET | Freq: Two times a day (BID) | ORAL | Status: DC

## 2014-05-09 ENCOUNTER — Other Ambulatory Visit: Payer: Self-pay | Admitting: Family Medicine

## 2014-05-11 ENCOUNTER — Encounter: Payer: Self-pay | Admitting: Family Medicine

## 2014-05-11 ENCOUNTER — Ambulatory Visit (INDEPENDENT_AMBULATORY_CARE_PROVIDER_SITE_OTHER): Payer: 59 | Admitting: Family Medicine

## 2014-05-11 VITALS — BP 130/90 | HR 85 | Resp 16 | Ht 64.0 in | Wt 295.0 lb

## 2014-05-11 DIAGNOSIS — I10 Essential (primary) hypertension: Secondary | ICD-10-CM

## 2014-05-11 DIAGNOSIS — B369 Superficial mycosis, unspecified: Secondary | ICD-10-CM | POA: Diagnosis not present

## 2014-05-11 DIAGNOSIS — B379 Candidiasis, unspecified: Secondary | ICD-10-CM | POA: Diagnosis not present

## 2014-05-11 DIAGNOSIS — E119 Type 2 diabetes mellitus without complications: Secondary | ICD-10-CM | POA: Diagnosis not present

## 2014-05-11 MED ORDER — TERBINAFINE HCL 1 % EX CREA: 1.0000 "application " | TOPICAL_CREAM | Freq: Two times a day (BID) | CUTANEOUS | Status: DC

## 2014-05-11 MED ORDER — NYSTATIN 100000 UNIT/GM EX POWD: CUTANEOUS | Status: DC

## 2014-05-11 NOTE — Patient Instructions (Addendum)
Annual wellness in  6 to 8 weeks , call if you need me before  All the best with surgery  Blood pressure is much better  hBa1C , chem 7 and EGFR in 6 weeks , before surgery  Nystatin powder and cream sent in for skin irritation

## 2014-05-11 NOTE — Progress Notes (Signed)
   Subjective:    [name], female    DOB: 12/21/1960, 54 y.o.   MRN: [id number]  HPI  Pt in primarily for review of uncontrolled blood pressure Denies adverse s/e from medication Denies chest pain, palpitation , PND , orhtopnea or leg swelling Has upcoming left shoulder surgery in th near future and is looking forward to this as the shoulder hurts all the time and she has marked limitation in mobility  Review of Systems See HPI     Objective:   Physical Exam  BP 130/90 mmHg  Pulse 85  Resp 16  Ht 5\' 4"  (1.626 m)  Wt 295 lb (133.811 kg)  BMI 50.61 kg/m2  SpO2 92% Patient alert and oriented and in no cardiopulmonary distress.  HEENT: No facial asymmetry, EOMI,   oropharynx pink and moist.  Neck supple no JVD, no mass.  Chest: Clear to auscultation bilaterally.  CVS: S1, S2 no murmurs, no S3.Regular rate.  ABD: Soft non tender.   Ext: No edema  Skin: Intact, yeast and fungal superficial skin infections in skin folds and moist areas,no  Bacterial superinfection       Assessment & Plan:  Essential hypertension Much improved, diastolic still elevated. No med change DASH diet and commitment to daily physical activity for a minimum of 30 minutes discussed and encouraged, as a part of hypertension management. The importance of attaining a healthy weight is also discussed. F/i in 6 to 8 weeks   Candidiasis Nystatin powder twice daily to rash for 10 days, then as needed   Dermatomycosis Topical antifungal and also nystatin oint twice daily as needed   Morbid obesity unchanged Patient re-educated about  the importance of commitment to a  minimum of 150 minutes of exercise per week. The importance of healthy food choices with portion control discussed. Encouraged to start a food diary, count calories and to consider  joining a support group. Sample diet sheets offered. Goals set by the patient for the next several months.      Diabetes  mellitus without complication Patient advised to reduce carb and sweets, commit to regular physical activity, take meds as prescribed, test blood as directed, and attempt to lose weight, to improve blood sugar control. Updated lab needed at/ before next visit.

## 2014-05-14 DIAGNOSIS — W19XXXA Unspecified fall, initial encounter: Secondary | ICD-10-CM | POA: Diagnosis not present

## 2014-05-14 DIAGNOSIS — Y929 Unspecified place or not applicable: Secondary | ICD-10-CM | POA: Diagnosis not present

## 2014-05-14 DIAGNOSIS — M7501 Adhesive capsulitis of right shoulder: Secondary | ICD-10-CM | POA: Diagnosis not present

## 2014-05-14 DIAGNOSIS — S46011A Strain of muscle(s) and tendon(s) of the rotator cuff of right shoulder, initial encounter: Secondary | ICD-10-CM | POA: Diagnosis not present

## 2014-05-14 DIAGNOSIS — G8918 Other acute postprocedural pain: Secondary | ICD-10-CM | POA: Diagnosis not present

## 2014-05-14 DIAGNOSIS — S43401A Unspecified sprain of right shoulder joint, initial encounter: Secondary | ICD-10-CM | POA: Diagnosis not present

## 2014-05-14 HISTORY — PX: OTHER SURGICAL HISTORY: SHX169

## 2014-05-21 DIAGNOSIS — S43431D Superior glenoid labrum lesion of right shoulder, subsequent encounter: Secondary | ICD-10-CM | POA: Diagnosis not present

## 2014-05-21 DIAGNOSIS — M7501 Adhesive capsulitis of right shoulder: Secondary | ICD-10-CM | POA: Diagnosis not present

## 2014-05-21 DIAGNOSIS — S46011D Strain of muscle(s) and tendon(s) of the rotator cuff of right shoulder, subsequent encounter: Secondary | ICD-10-CM | POA: Diagnosis not present

## 2014-05-25 ENCOUNTER — Encounter (HOSPITAL_COMMUNITY): Payer: Self-pay

## 2014-05-25 ENCOUNTER — Ambulatory Visit (HOSPITAL_COMMUNITY)
Admission: RE | Admit: 2014-05-25 | Discharge: 2014-05-25 | Disposition: A | Discharge status: Home / Self Care | Payer: 59 | Source: Ambulatory Visit | Attending: Specialist | Admitting: Specialist

## 2014-05-25 DIAGNOSIS — B369 Superficial mycosis, unspecified: Secondary | ICD-10-CM | POA: Insufficient documentation

## 2014-05-25 DIAGNOSIS — M6289 Other specified disorders of muscle: Secondary | ICD-10-CM

## 2014-05-25 DIAGNOSIS — S46011D Strain of muscle(s) and tendon(s) of the rotator cuff of right shoulder, subsequent encounter: Secondary | ICD-10-CM | POA: Diagnosis not present

## 2014-05-25 DIAGNOSIS — R29898 Other symptoms and signs involving the musculoskeletal system: Secondary | ICD-10-CM

## 2014-05-25 DIAGNOSIS — B379 Candidiasis, unspecified: Secondary | ICD-10-CM | POA: Insufficient documentation

## 2014-05-25 DIAGNOSIS — M25611 Stiffness of right shoulder, not elsewhere classified: Secondary | ICD-10-CM | POA: Insufficient documentation

## 2014-05-25 DIAGNOSIS — R531 Weakness: Secondary | ICD-10-CM | POA: Diagnosis not present

## 2014-05-25 DIAGNOSIS — Z9889 Other specified postprocedural states: Secondary | ICD-10-CM

## 2014-05-25 DIAGNOSIS — M25511 Pain in right shoulder: Secondary | ICD-10-CM | POA: Diagnosis not present

## 2014-05-25 DIAGNOSIS — Z4789 Encounter for other orthopedic aftercare: Secondary | ICD-10-CM | POA: Diagnosis not present

## 2014-05-25 DIAGNOSIS — M629 Disorder of muscle, unspecified: Secondary | ICD-10-CM

## 2014-05-25 NOTE — Assessment & Plan Note (Signed)
Controlled, no change in medication  

## 2014-05-25 NOTE — Assessment & Plan Note (Signed)
Much improved, diastolic still elevated. No med change DASH diet and commitment to daily physical activity for a minimum of 30 minutes discussed and encouraged, as a part of hypertension management. The importance of attaining a healthy weight is also discussed. F/i in 6 to 8 weeks

## 2014-05-25 NOTE — Assessment & Plan Note (Signed)
Nystatin powder twice daily to rash for 10 days, then as needed

## 2014-05-25 NOTE — Assessment & Plan Note (Signed)
Increased pain with radiation to upper extremity , needs MRI c spine to further evaluate

## 2014-05-25 NOTE — Patient Instructions (Signed)
SHOULDER: Flexion On Table   Place hands on table, elbows straight. Move hips away from body. Press hands down into table. Hold _5__ seconds. _10__ reps per set, _2__ sets per day, _7__ days per week  Abduction (Passive)   With arm out to side, resting on table, lower head toward arm, keeping trunk away from table. Hold _5___ seconds. Repeat 10 ____ times. Do __2__ sessions per day.      Internal Rotation (Assistive)   Seated with elbow bent at right angle and held against side, slide arm on table surface in an inward arc. Repeat __10__ times. Do __2__ sessions per day. Activity: Use this motion to brush crumbs off the table.
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
## 2014-05-25 NOTE — Assessment & Plan Note (Signed)
Topical antifungal and also nystatin oint twice daily as needed

## 2014-05-25 NOTE — Assessment & Plan Note (Signed)
Controlled, no change in medication Patient advised to reduce carb and sweets, commit to regular physical activity, take meds as prescribed, test blood as directed, and attempt to lose weight, to improve blood sugar control. Updated lab needed at/ before next visit.  

## 2014-05-25 NOTE — Assessment & Plan Note (Signed)
unchanged Patient re-educated about  the importance of commitment to a  minimum of 150 minutes of exercise per week. The importance of healthy food choices with portion control discussed. Encouraged to start a food diary, count calories and to consider  joining a support group. Sample diet sheets offered. Goals set by the patient for the next several months.    

## 2014-05-25 NOTE — Assessment & Plan Note (Signed)
Controlled, no change in medication Hyperlipidemia:Low fat diet discussed and encouraged.  Updated lab needed at/ before next visit.  

## 2014-05-25 NOTE — Assessment & Plan Note (Signed)
Patient advised to reduce carb and sweets, commit to regular physical activity, take meds as prescribed, test blood as directed, and attempt to lose weight, to improve blood sugar control. Updated lab needed at/ before next visit.  

## 2014-05-27 ENCOUNTER — Ambulatory Visit (HOSPITAL_COMMUNITY): Payer: 59 | Admitting: Specialist

## 2014-06-01 ENCOUNTER — Encounter (HOSPITAL_COMMUNITY): Payer: Self-pay

## 2014-06-01 ENCOUNTER — Ambulatory Visit (HOSPITAL_COMMUNITY)
Admission: RE | Admit: 2014-06-01 | Discharge: 2014-06-01 | Disposition: A | Discharge status: Home / Self Care | Payer: Medicare Other | Source: Ambulatory Visit | Attending: Specialist | Admitting: Specialist

## 2014-06-01 DIAGNOSIS — Z4789 Encounter for other orthopedic aftercare: Secondary | ICD-10-CM | POA: Insufficient documentation

## 2014-06-01 DIAGNOSIS — S46011D Strain of muscle(s) and tendon(s) of the rotator cuff of right shoulder, subsequent encounter: Secondary | ICD-10-CM | POA: Insufficient documentation

## 2014-06-01 DIAGNOSIS — R531 Weakness: Secondary | ICD-10-CM | POA: Diagnosis not present

## 2014-06-01 DIAGNOSIS — X58XXXD Exposure to other specified factors, subsequent encounter: Secondary | ICD-10-CM | POA: Diagnosis not present

## 2014-06-01 DIAGNOSIS — M25611 Stiffness of right shoulder, not elsewhere classified: Secondary | ICD-10-CM | POA: Diagnosis not present

## 2014-06-01 DIAGNOSIS — M25511 Pain in right shoulder: Secondary | ICD-10-CM | POA: Diagnosis not present

## 2014-06-01 DIAGNOSIS — R29898 Other symptoms and signs involving the musculoskeletal system: Secondary | ICD-10-CM

## 2014-06-01 DIAGNOSIS — M629 Disorder of muscle, unspecified: Secondary | ICD-10-CM

## 2014-06-01 DIAGNOSIS — M6289 Other specified disorders of muscle: Secondary | ICD-10-CM

## 2014-06-01 DIAGNOSIS — Z9889 Other specified postprocedural states: Secondary | ICD-10-CM
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
## 2014-06-02 DIAGNOSIS — E119 Type 2 diabetes mellitus without complications: Secondary | ICD-10-CM | POA: Diagnosis not present

## 2014-06-02 LAB — HM DIABETES EYE EXAM

## 2014-06-03 ENCOUNTER — Ambulatory Visit (HOSPITAL_COMMUNITY)
Admission: RE | Admit: 2014-06-03 | Discharge: 2014-06-03 | Disposition: A | Discharge status: Home / Self Care | Payer: Medicare Other | Source: Ambulatory Visit | Attending: Specialist | Admitting: Specialist

## 2014-06-03 ENCOUNTER — Encounter (HOSPITAL_COMMUNITY): Payer: Self-pay

## 2014-06-03 DIAGNOSIS — R29898 Other symptoms and signs involving the musculoskeletal system: Secondary | ICD-10-CM

## 2014-06-03 DIAGNOSIS — S46011D Strain of muscle(s) and tendon(s) of the rotator cuff of right shoulder, subsequent encounter: Secondary | ICD-10-CM | POA: Diagnosis not present

## 2014-06-03 DIAGNOSIS — M6289 Other specified disorders of muscle: Secondary | ICD-10-CM

## 2014-06-03 DIAGNOSIS — R531 Weakness: Secondary | ICD-10-CM | POA: Diagnosis not present

## 2014-06-03 DIAGNOSIS — Z4789 Encounter for other orthopedic aftercare: Secondary | ICD-10-CM | POA: Diagnosis not present

## 2014-06-03 DIAGNOSIS — M25511 Pain in right shoulder: Secondary | ICD-10-CM

## 2014-06-03 DIAGNOSIS — M629 Disorder of muscle, unspecified: Secondary | ICD-10-CM

## 2014-06-03 DIAGNOSIS — M25611 Stiffness of right shoulder, not elsewhere classified: Secondary | ICD-10-CM | POA: Diagnosis not present

## 2014-06-03 DIAGNOSIS — Z9889 Other specified postprocedural states: Secondary | ICD-10-CM

## 2014-06-03 NOTE — Addendum Note (Signed)
Encounter addended by: Debby Bud, OT on: 06/03/2014 10:18 AM<BR>     Documentation filed: Clinical Notes
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
## 2014-06-04 ENCOUNTER — Other Ambulatory Visit: Payer: Self-pay

## 2014-06-04 MED ORDER — OXYCODONE HCL ER 40 MG PO T12A: 40.0000 mg | EXTENDED_RELEASE_TABLET | Freq: Two times a day (BID) | ORAL | Status: DC

## 2014-06-05 ENCOUNTER — Ambulatory Visit (HOSPITAL_COMMUNITY)
Admission: RE | Admit: 2014-06-05 | Discharge: 2014-06-05 | Disposition: A | Discharge status: Home / Self Care | Payer: Medicare Other | Source: Ambulatory Visit | Attending: Specialist | Admitting: Specialist

## 2014-06-05 DIAGNOSIS — M25511 Pain in right shoulder: Secondary | ICD-10-CM | POA: Diagnosis not present

## 2014-06-05 DIAGNOSIS — M6289 Other specified disorders of muscle: Secondary | ICD-10-CM

## 2014-06-05 DIAGNOSIS — M25611 Stiffness of right shoulder, not elsewhere classified: Secondary | ICD-10-CM | POA: Diagnosis not present

## 2014-06-05 DIAGNOSIS — M629 Disorder of muscle, unspecified: Secondary | ICD-10-CM

## 2014-06-05 DIAGNOSIS — Z4789 Encounter for other orthopedic aftercare: Secondary | ICD-10-CM | POA: Diagnosis not present

## 2014-06-05 DIAGNOSIS — S46011D Strain of muscle(s) and tendon(s) of the rotator cuff of right shoulder, subsequent encounter: Secondary | ICD-10-CM | POA: Diagnosis not present

## 2014-06-05 DIAGNOSIS — R531 Weakness: Secondary | ICD-10-CM | POA: Diagnosis not present

## 2014-06-05 DIAGNOSIS — R29898 Other symptoms and signs involving the musculoskeletal system: Secondary | ICD-10-CM

## 2014-06-05 DIAGNOSIS — M542 Cervicalgia: Secondary | ICD-10-CM

## 2014-06-05 DIAGNOSIS — Z9889 Other specified postprocedural states: Secondary | ICD-10-CM
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
## 2014-06-08 ENCOUNTER — Ambulatory Visit (HOSPITAL_COMMUNITY): Payer: Medicare Other | Admitting: Specialist

## 2014-06-10 ENCOUNTER — Telehealth: Payer: Self-pay | Admitting: Family Medicine

## 2014-06-10 ENCOUNTER — Ambulatory Visit (HOSPITAL_COMMUNITY)
Admission: RE | Admit: 2014-06-10 | Discharge: 2014-06-10 | Disposition: A | Discharge status: Home / Self Care | Payer: Medicare Other | Source: Ambulatory Visit | Attending: Specialist | Admitting: Specialist

## 2014-06-10 DIAGNOSIS — M25511 Pain in right shoulder: Secondary | ICD-10-CM | POA: Diagnosis not present

## 2014-06-10 DIAGNOSIS — Z4789 Encounter for other orthopedic aftercare: Secondary | ICD-10-CM | POA: Diagnosis not present

## 2014-06-10 DIAGNOSIS — R531 Weakness: Secondary | ICD-10-CM | POA: Diagnosis not present

## 2014-06-10 DIAGNOSIS — S46011D Strain of muscle(s) and tendon(s) of the rotator cuff of right shoulder, subsequent encounter: Secondary | ICD-10-CM | POA: Diagnosis not present

## 2014-06-10 DIAGNOSIS — M629 Disorder of muscle, unspecified: Secondary | ICD-10-CM

## 2014-06-10 DIAGNOSIS — M6289 Other specified disorders of muscle: Secondary | ICD-10-CM

## 2014-06-10 DIAGNOSIS — M25611 Stiffness of right shoulder, not elsewhere classified: Secondary | ICD-10-CM | POA: Diagnosis not present

## 2014-06-10 DIAGNOSIS — R29898 Other symptoms and signs involving the musculoskeletal system: Secondary | ICD-10-CM

## 2014-06-10 NOTE — Telephone Encounter (Signed)
Pls let pt know thatflexeril is not covered will need to try zannaflex, I will enter pls send after you spk with her
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
## 2014-06-11 DIAGNOSIS — M2042 Other hammer toe(s) (acquired), left foot: Secondary | ICD-10-CM | POA: Diagnosis not present

## 2014-06-11 DIAGNOSIS — M2041 Other hammer toe(s) (acquired), right foot: Secondary | ICD-10-CM | POA: Diagnosis not present

## 2014-06-11 DIAGNOSIS — M79674 Pain in right toe(s): Secondary | ICD-10-CM | POA: Diagnosis not present

## 2014-06-11 DIAGNOSIS — M79675 Pain in left toe(s): Secondary | ICD-10-CM | POA: Diagnosis not present

## 2014-06-12 ENCOUNTER — Ambulatory Visit (HOSPITAL_COMMUNITY): Payer: Medicare Other

## 2014-06-12 ENCOUNTER — Other Ambulatory Visit: Payer: Self-pay

## 2014-06-12 MED ORDER — TIZANIDINE HCL 4 MG PO TABS: 4.0000 mg | ORAL_TABLET | Freq: Three times a day (TID) | ORAL | Status: DC

## 2014-06-12 NOTE — Telephone Encounter (Signed)
Patient aware and med sent to pharmacy.  

## 2014-06-15 ENCOUNTER — Telehealth: Payer: Self-pay

## 2014-06-15 ENCOUNTER — Ambulatory Visit: Payer: 59 | Admitting: Family Medicine

## 2014-06-15 MED ORDER — SULFAMETHOXAZOLE-TRIMETHOPRIM 800-160 MG PO TABS: 1.0000 | ORAL_TABLET | Freq: Two times a day (BID) | ORAL | Status: DC

## 2014-06-15 MED ORDER — PROMETHAZINE-DM 6.25-15 MG/5ML PO SYRP: ORAL_SOLUTION | ORAL | Status: DC

## 2014-06-15 MED ORDER — BENZONATATE 100 MG PO CAPS: 100.0000 mg | ORAL_CAPSULE | Freq: Three times a day (TID) | ORAL | Status: DC | PRN

## 2014-06-15 NOTE — Telephone Encounter (Signed)
Antibiotc, decongestant and cough suppressant have been sent to pharmacy, pls let her know

## 2014-06-15 NOTE — Telephone Encounter (Signed)
Patient aware.

## 2014-06-17 ENCOUNTER — Telehealth (HOSPITAL_COMMUNITY): Payer: Self-pay

## 2014-06-17 ENCOUNTER — Ambulatory Visit (HOSPITAL_COMMUNITY): Payer: Medicare Other

## 2014-06-17 NOTE — Telephone Encounter (Signed)
She is sick and can not come in today

## 2014-06-19 ENCOUNTER — Ambulatory Visit (HOSPITAL_COMMUNITY): Payer: Medicare Other

## 2014-06-19 ENCOUNTER — Telehealth: Payer: Self-pay | Admitting: Family Medicine

## 2014-06-19 NOTE — Telephone Encounter (Signed)
Pt canceled she is not feeling well today and will come by next week with a new script

## 2014-06-22 ENCOUNTER — Encounter: Payer: 59 | Admitting: Family Medicine

## 2014-06-22 ENCOUNTER — Other Ambulatory Visit: Payer: Self-pay

## 2014-06-22 DIAGNOSIS — J208 Acute bronchitis due to other specified organisms: Secondary | ICD-10-CM

## 2014-06-22 DIAGNOSIS — J209 Acute bronchitis, unspecified: Secondary | ICD-10-CM

## 2014-06-22 MED ORDER — FLUCONAZOLE 150 MG PO TABS: ORAL_TABLET | ORAL | Status: DC

## 2014-06-22 NOTE — Telephone Encounter (Signed)
HAS APPT TODAY

## 2014-06-23 ENCOUNTER — Ambulatory Visit (HOSPITAL_COMMUNITY): Payer: Medicare Other

## 2014-06-23 DIAGNOSIS — M6289 Other specified disorders of muscle: Secondary | ICD-10-CM

## 2014-06-23 DIAGNOSIS — M25511 Pain in right shoulder: Secondary | ICD-10-CM | POA: Diagnosis not present

## 2014-06-23 DIAGNOSIS — Z4789 Encounter for other orthopedic aftercare: Secondary | ICD-10-CM | POA: Diagnosis not present

## 2014-06-23 DIAGNOSIS — M25611 Stiffness of right shoulder, not elsewhere classified: Secondary | ICD-10-CM | POA: Diagnosis not present

## 2014-06-23 DIAGNOSIS — R531 Weakness: Secondary | ICD-10-CM | POA: Diagnosis not present

## 2014-06-23 DIAGNOSIS — S46011D Strain of muscle(s) and tendon(s) of the rotator cuff of right shoulder, subsequent encounter: Secondary | ICD-10-CM | POA: Diagnosis not present

## 2014-06-23 DIAGNOSIS — M629 Disorder of muscle, unspecified: Secondary | ICD-10-CM

## 2014-06-23 DIAGNOSIS — R29898 Other symptoms and signs involving the musculoskeletal system: Secondary | ICD-10-CM
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
## 2014-06-25 ENCOUNTER — Other Ambulatory Visit: Payer: Self-pay

## 2014-06-25 MED ORDER — OXYCODONE HCL ER 40 MG PO T12A: 40.0000 mg | EXTENDED_RELEASE_TABLET | Freq: Two times a day (BID) | ORAL | Status: DC

## 2014-06-26 ENCOUNTER — Ambulatory Visit (HOSPITAL_COMMUNITY): Payer: Medicare Other

## 2014-06-29 ENCOUNTER — Ambulatory Visit (HOSPITAL_COMMUNITY): Payer: Medicare Other | Admitting: Specialist

## 2014-07-01 ENCOUNTER — Ambulatory Visit (HOSPITAL_COMMUNITY): Payer: Medicare Other | Attending: Family Medicine | Admitting: Specialist

## 2014-07-01 DIAGNOSIS — M25511 Pain in right shoulder: Secondary | ICD-10-CM | POA: Diagnosis not present

## 2014-07-01 DIAGNOSIS — M2560 Stiffness of unspecified joint, not elsewhere classified: Secondary | ICD-10-CM

## 2014-07-01 DIAGNOSIS — R531 Weakness: Secondary | ICD-10-CM | POA: Insufficient documentation

## 2014-07-01 DIAGNOSIS — M25611 Stiffness of right shoulder, not elsewhere classified: Secondary | ICD-10-CM | POA: Diagnosis not present

## 2014-07-01 DIAGNOSIS — Z4789 Encounter for other orthopedic aftercare: Secondary | ICD-10-CM | POA: Insufficient documentation

## 2014-07-01 DIAGNOSIS — S46011D Strain of muscle(s) and tendon(s) of the rotator cuff of right shoulder, subsequent encounter: Secondary | ICD-10-CM | POA: Insufficient documentation
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
## 2014-07-03 ENCOUNTER — Telehealth (HOSPITAL_COMMUNITY): Payer: Self-pay | Admitting: Occupational Therapy

## 2014-07-03 ENCOUNTER — Ambulatory Visit (HOSPITAL_COMMUNITY): Payer: Medicare Other | Admitting: Occupational Therapy

## 2014-07-03 NOTE — Telephone Encounter (Signed)
She is sick and can not come in °

## 2014-07-05 ENCOUNTER — Other Ambulatory Visit: Payer: Self-pay | Admitting: Family Medicine

## 2014-07-06 ENCOUNTER — Telehealth: Payer: Self-pay

## 2014-07-06 ENCOUNTER — Ambulatory Visit (HOSPITAL_COMMUNITY): Payer: Medicare Other | Admitting: Occupational Therapy

## 2014-07-06 NOTE — Telephone Encounter (Signed)
Patient presented to office and was advised to seek care for acute severe abdominal pain.  Patient states that she will go to the ED

## 2014-07-08 ENCOUNTER — Ambulatory Visit (HOSPITAL_COMMUNITY): Payer: Medicare Other | Admitting: Occupational Therapy

## 2014-07-08 ENCOUNTER — Encounter (HOSPITAL_COMMUNITY): Payer: Self-pay | Admitting: Occupational Therapy

## 2014-07-08 DIAGNOSIS — M629 Disorder of muscle, unspecified: Secondary | ICD-10-CM

## 2014-07-08 DIAGNOSIS — R29898 Other symptoms and signs involving the musculoskeletal system: Secondary | ICD-10-CM

## 2014-07-08 DIAGNOSIS — Z4789 Encounter for other orthopedic aftercare: Secondary | ICD-10-CM | POA: Diagnosis not present

## 2014-07-08 DIAGNOSIS — M2560 Stiffness of unspecified joint, not elsewhere classified: Secondary | ICD-10-CM

## 2014-07-08 DIAGNOSIS — M25511 Pain in right shoulder: Secondary | ICD-10-CM

## 2014-07-08 DIAGNOSIS — S46011D Strain of muscle(s) and tendon(s) of the rotator cuff of right shoulder, subsequent encounter: Secondary | ICD-10-CM | POA: Diagnosis not present

## 2014-07-08 DIAGNOSIS — M6289 Other specified disorders of muscle: Secondary | ICD-10-CM

## 2014-07-08 DIAGNOSIS — R531 Weakness: Secondary | ICD-10-CM | POA: Diagnosis not present

## 2014-07-08 DIAGNOSIS — M25611 Stiffness of right shoulder, not elsewhere classified: Secondary | ICD-10-CM | POA: Diagnosis not present

## 2014-07-08 NOTE — Patient Instructions (Signed)
ROM: Flexion - Wand   Bring wand directly over head, leading with right side. Reach back until stretch is felt.  Repeat __10__ times per set. Do _1___ sets per session. Do __1-2__ sessions per day.  http://orth.exer.us/744   Copyright  VHI. All rights reserved.   ROM: Abduction - Wand   Holding wand with left hand palm up, push wand directly out to side, leading with other hand palm down, until stretch is felt.  Repeat __10__ times per set. Do __1__ sets per session. Do _1-2___ sessions per day.  http://orth.exer.us/746   Copyright  VHI. All rights reserved.   ROM: Horizontal Abduction / Adduction - Wand   Keeping both palms down, push right hand across body with other hand. Then pull back across body, keeping arms parallel to floor. Do not allow trunk to twist. Repeat __10__ times per set. Do __1__ sets per session. Do 1-2___ sessions per day.  http://orth.exer.us/752   Copyright  VHI. All rights reserved.   ROM: External / Internal Rotation - Wand   Holding wand with left hand palm up, push out from body with other hand, palm down. Keep both elbows bent.  Repeat to other side, leading with same hand. Keep elbows bent. Repeat __10__ times per set. Do __1__ sets per session. Do 1-2____ sessions per day.  http://orth.exer.us/748   Copyright  VHI. All rights reserved.   
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
## 2014-07-09 ENCOUNTER — Other Ambulatory Visit: Payer: Self-pay | Admitting: Family Medicine

## 2014-07-10 ENCOUNTER — Ambulatory Visit (HOSPITAL_COMMUNITY): Payer: Medicare Other | Admitting: Occupational Therapy

## 2014-07-10 ENCOUNTER — Encounter (HOSPITAL_COMMUNITY): Payer: Self-pay | Admitting: Occupational Therapy

## 2014-07-10 DIAGNOSIS — M25511 Pain in right shoulder: Secondary | ICD-10-CM

## 2014-07-10 DIAGNOSIS — M2560 Stiffness of unspecified joint, not elsewhere classified: Secondary | ICD-10-CM

## 2014-07-10 DIAGNOSIS — M629 Disorder of muscle, unspecified: Secondary | ICD-10-CM

## 2014-07-10 DIAGNOSIS — M25611 Stiffness of right shoulder, not elsewhere classified: Secondary | ICD-10-CM | POA: Diagnosis not present

## 2014-07-10 DIAGNOSIS — Z4789 Encounter for other orthopedic aftercare: Secondary | ICD-10-CM | POA: Diagnosis not present

## 2014-07-10 DIAGNOSIS — S46011D Strain of muscle(s) and tendon(s) of the rotator cuff of right shoulder, subsequent encounter: Secondary | ICD-10-CM | POA: Diagnosis not present

## 2014-07-10 DIAGNOSIS — M6289 Other specified disorders of muscle: Secondary | ICD-10-CM

## 2014-07-10 DIAGNOSIS — R531 Weakness: Secondary | ICD-10-CM | POA: Diagnosis not present
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
## 2014-07-13 ENCOUNTER — Ambulatory Visit (HOSPITAL_COMMUNITY): Payer: Medicare Other | Admitting: Specialist

## 2014-07-15 ENCOUNTER — Ambulatory Visit (HOSPITAL_COMMUNITY): Payer: Medicare Other

## 2014-07-17 ENCOUNTER — Ambulatory Visit (HOSPITAL_COMMUNITY): Payer: Medicare Other

## 2014-07-20 ENCOUNTER — Ambulatory Visit (HOSPITAL_COMMUNITY): Payer: Medicare Other

## 2014-07-20 DIAGNOSIS — M6289 Other specified disorders of muscle: Secondary | ICD-10-CM

## 2014-07-20 DIAGNOSIS — M629 Disorder of muscle, unspecified: Secondary | ICD-10-CM

## 2014-07-20 DIAGNOSIS — M25511 Pain in right shoulder: Secondary | ICD-10-CM

## 2014-07-20 DIAGNOSIS — S46011D Strain of muscle(s) and tendon(s) of the rotator cuff of right shoulder, subsequent encounter: Secondary | ICD-10-CM | POA: Diagnosis not present

## 2014-07-20 DIAGNOSIS — M25611 Stiffness of right shoulder, not elsewhere classified: Secondary | ICD-10-CM | POA: Diagnosis not present

## 2014-07-20 DIAGNOSIS — R29898 Other symptoms and signs involving the musculoskeletal system: Secondary | ICD-10-CM

## 2014-07-20 DIAGNOSIS — Z4789 Encounter for other orthopedic aftercare: Secondary | ICD-10-CM | POA: Diagnosis not present

## 2014-07-20 DIAGNOSIS — R531 Weakness: Secondary | ICD-10-CM | POA: Diagnosis not present

## 2014-07-20 DIAGNOSIS — M2560 Stiffness of unspecified joint, not elsewhere classified: Secondary | ICD-10-CM
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
## 2014-07-22 ENCOUNTER — Encounter (HOSPITAL_COMMUNITY): Payer: Self-pay

## 2014-07-22 ENCOUNTER — Telehealth (HOSPITAL_COMMUNITY): Payer: Self-pay

## 2014-07-22 NOTE — Telephone Encounter (Signed)
She has to go back to work and turn in some papers after her office visit today with her MD

## 2014-07-24 ENCOUNTER — Encounter (HOSPITAL_COMMUNITY): Payer: Self-pay

## 2014-07-24 ENCOUNTER — Ambulatory Visit (HOSPITAL_COMMUNITY): Payer: Medicare Other

## 2014-07-24 DIAGNOSIS — R531 Weakness: Secondary | ICD-10-CM | POA: Diagnosis not present

## 2014-07-24 DIAGNOSIS — M6289 Other specified disorders of muscle: Secondary | ICD-10-CM

## 2014-07-24 DIAGNOSIS — M629 Disorder of muscle, unspecified: Secondary | ICD-10-CM

## 2014-07-24 DIAGNOSIS — Z4789 Encounter for other orthopedic aftercare: Secondary | ICD-10-CM | POA: Diagnosis not present

## 2014-07-24 DIAGNOSIS — S46011D Strain of muscle(s) and tendon(s) of the rotator cuff of right shoulder, subsequent encounter: Secondary | ICD-10-CM | POA: Diagnosis not present

## 2014-07-24 DIAGNOSIS — R29898 Other symptoms and signs involving the musculoskeletal system: Secondary | ICD-10-CM

## 2014-07-24 DIAGNOSIS — M25511 Pain in right shoulder: Secondary | ICD-10-CM

## 2014-07-24 DIAGNOSIS — M25611 Stiffness of right shoulder, not elsewhere classified: Secondary | ICD-10-CM | POA: Diagnosis not present
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
## 2014-07-27 ENCOUNTER — Ambulatory Visit (HOSPITAL_COMMUNITY): Payer: Medicare Other

## 2014-07-27 ENCOUNTER — Telehealth (HOSPITAL_COMMUNITY): Payer: Self-pay

## 2014-07-27 NOTE — Telephone Encounter (Signed)
Her back is hurting today and she can not come in

## 2014-07-29 ENCOUNTER — Encounter (HOSPITAL_COMMUNITY): Payer: Self-pay

## 2014-07-29 ENCOUNTER — Ambulatory Visit (HOSPITAL_COMMUNITY): Payer: Medicare Other

## 2014-07-29 ENCOUNTER — Telehealth (HOSPITAL_COMMUNITY): Payer: Self-pay

## 2014-07-29 DIAGNOSIS — M629 Disorder of muscle, unspecified: Secondary | ICD-10-CM

## 2014-07-29 DIAGNOSIS — M2560 Stiffness of unspecified joint, not elsewhere classified: Secondary | ICD-10-CM

## 2014-07-29 DIAGNOSIS — M25611 Stiffness of right shoulder, not elsewhere classified: Secondary | ICD-10-CM | POA: Diagnosis not present

## 2014-07-29 DIAGNOSIS — M25511 Pain in right shoulder: Secondary | ICD-10-CM

## 2014-07-29 DIAGNOSIS — R29898 Other symptoms and signs involving the musculoskeletal system: Secondary | ICD-10-CM

## 2014-07-29 DIAGNOSIS — S46011D Strain of muscle(s) and tendon(s) of the rotator cuff of right shoulder, subsequent encounter: Secondary | ICD-10-CM | POA: Diagnosis not present

## 2014-07-29 DIAGNOSIS — Z4789 Encounter for other orthopedic aftercare: Secondary | ICD-10-CM | POA: Diagnosis not present

## 2014-07-29 DIAGNOSIS — R531 Weakness: Secondary | ICD-10-CM | POA: Diagnosis not present

## 2014-07-29 DIAGNOSIS — M6289 Other specified disorders of muscle: Secondary | ICD-10-CM

## 2014-07-29 NOTE — Telephone Encounter (Signed)
Called patient in regards to missed appt today 07/29/14 at 11:00. Pt reported that this appt time was supposed to be rescheduled to 3:15 as she has difficulty with her back pain in the morning. Reported that there had been an error in the schedule change and corrected appt time to 3:15 for today 07/29/14.   Ailene Ravel, OTR/L,CBIS  [phone]
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
## 2014-07-30 ENCOUNTER — Other Ambulatory Visit: Payer: Self-pay

## 2014-07-30 MED ORDER — OXYCODONE HCL ER 40 MG PO T12A: 40.0000 mg | EXTENDED_RELEASE_TABLET | Freq: Two times a day (BID) | ORAL | Status: DC

## 2014-08-04 ENCOUNTER — Ambulatory Visit (HOSPITAL_COMMUNITY): Payer: Medicare Other | Admitting: Occupational Therapy

## 2014-08-06 ENCOUNTER — Ambulatory Visit (HOSPITAL_COMMUNITY): Payer: Medicare Other

## 2014-08-06 ENCOUNTER — Other Ambulatory Visit: Payer: Self-pay | Admitting: Family Medicine

## 2014-08-07 ENCOUNTER — Encounter (HOSPITAL_COMMUNITY): Payer: Self-pay

## 2014-08-07 ENCOUNTER — Telehealth (HOSPITAL_COMMUNITY): Payer: Self-pay

## 2014-08-07 ENCOUNTER — Other Ambulatory Visit: Payer: Self-pay | Admitting: Family Medicine

## 2014-08-07 NOTE — Telephone Encounter (Signed)
In the parking lot but something came up at home that she has to go back home to take care of

## 2014-08-09 ENCOUNTER — Telehealth: Payer: Self-pay | Admitting: Family Medicine

## 2014-08-09 DIAGNOSIS — E119 Type 2 diabetes mellitus without complications: Secondary | ICD-10-CM

## 2014-08-09 DIAGNOSIS — E785 Hyperlipidemia, unspecified: Secondary | ICD-10-CM

## 2014-08-09 DIAGNOSIS — I10 Essential (primary) hypertension: Secondary | ICD-10-CM

## 2014-08-09 NOTE — Telephone Encounter (Signed)
Pt needs to make and  Keep f/u appt in May before she has any more pain meds filled. Chronic conditions in Jan were uncontrolled Needs to [place] meds [place] Needs microalb, fasting lipid, cmp and EGFr and HBA1C 1 week prior to appt, labs may be doen from May 1 and after will you pls communicate this info to pt and order necessary labs

## 2014-08-10 NOTE — Telephone Encounter (Signed)
patient aware

## 2014-08-10 NOTE — Addendum Note (Signed)
Addended by: Eual Fines on: 08/10/2014 03:41 PM   Modules accepted: Orders

## 2014-08-11 ENCOUNTER — Ambulatory Visit (HOSPITAL_COMMUNITY): Payer: Medicare Other

## 2014-08-12 DIAGNOSIS — T148 Other injury of unspecified body region: Secondary | ICD-10-CM | POA: Diagnosis not present

## 2014-08-12 DIAGNOSIS — Z4789 Encounter for other orthopedic aftercare: Secondary | ICD-10-CM | POA: Diagnosis not present

## 2014-08-12 DIAGNOSIS — S46011D Strain of muscle(s) and tendon(s) of the rotator cuff of right shoulder, subsequent encounter: Secondary | ICD-10-CM | POA: Diagnosis not present

## 2014-08-13 ENCOUNTER — Encounter (HOSPITAL_COMMUNITY): Payer: Self-pay | Admitting: Occupational Therapy

## 2014-08-13 ENCOUNTER — Ambulatory Visit (HOSPITAL_COMMUNITY): Payer: Medicare Other | Attending: Specialist | Admitting: Occupational Therapy

## 2014-08-13 DIAGNOSIS — M25511 Pain in right shoulder: Secondary | ICD-10-CM | POA: Diagnosis not present

## 2014-08-13 DIAGNOSIS — M25611 Stiffness of right shoulder, not elsewhere classified: Secondary | ICD-10-CM | POA: Insufficient documentation

## 2014-08-13 DIAGNOSIS — R531 Weakness: Secondary | ICD-10-CM | POA: Diagnosis not present

## 2014-08-13 DIAGNOSIS — M2560 Stiffness of unspecified joint, not elsewhere classified: Secondary | ICD-10-CM

## 2014-08-13 DIAGNOSIS — M629 Disorder of muscle, unspecified: Secondary | ICD-10-CM

## 2014-08-13 DIAGNOSIS — S46011D Strain of muscle(s) and tendon(s) of the rotator cuff of right shoulder, subsequent encounter: Secondary | ICD-10-CM | POA: Diagnosis not present

## 2014-08-13 DIAGNOSIS — Z4789 Encounter for other orthopedic aftercare: Secondary | ICD-10-CM | POA: Diagnosis not present

## 2014-08-13 DIAGNOSIS — R29898 Other symptoms and signs involving the musculoskeletal system: Secondary | ICD-10-CM

## 2014-08-13 DIAGNOSIS — M6289 Other specified disorders of muscle: Secondary | ICD-10-CM
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
## 2014-08-18 ENCOUNTER — Ambulatory Visit (HOSPITAL_COMMUNITY): Payer: Medicare Other

## 2014-08-20 ENCOUNTER — Encounter (HOSPITAL_COMMUNITY): Payer: Self-pay | Admitting: Occupational Therapy

## 2014-08-22 ENCOUNTER — Other Ambulatory Visit: Payer: Self-pay | Admitting: Family Medicine

## 2014-08-25 ENCOUNTER — Ambulatory Visit (HOSPITAL_COMMUNITY): Payer: Medicare Other | Admitting: Occupational Therapy

## 2014-08-27 ENCOUNTER — Ambulatory Visit (HOSPITAL_COMMUNITY): Payer: Medicare Other | Admitting: Occupational Therapy

## 2014-08-28 ENCOUNTER — Other Ambulatory Visit: Payer: Self-pay

## 2014-08-28 MED ORDER — OXYCODONE HCL ER 40 MG PO T12A: 40.0000 mg | EXTENDED_RELEASE_TABLET | Freq: Two times a day (BID) | ORAL | Status: DC

## 2014-08-31 DIAGNOSIS — E785 Hyperlipidemia, unspecified: Secondary | ICD-10-CM | POA: Diagnosis not present

## 2014-08-31 DIAGNOSIS — E119 Type 2 diabetes mellitus without complications: Secondary | ICD-10-CM | POA: Diagnosis not present

## 2014-08-31 LAB — HEMOGLOBIN A1C
Hgb A1c MFr Bld: 7.1 % — ABNORMAL HIGH (ref <5.7)
Mean Plasma Glucose: 157 mg/dL — ABNORMAL HIGH (ref <117)

## 2014-09-01 LAB — COMPLETE METABOLIC PANEL WITH GFR
ALT: 24 U/L (ref 0–35)
AST: 24 U/L (ref 0–37)
Albumin: 4.5 g/dL (ref 3.5–5.2)
Alkaline Phosphatase: 80 U/L (ref 39–117)
BUN: 13 mg/dL (ref 6–23)
CALCIUM: 9.7 mg/dL (ref 8.4–10.5)
CHLORIDE: 101 meq/L (ref 96–112)
CO2: 30 mEq/L (ref 19–32)
Creat: 0.78 mg/dL (ref 0.50–1.10)
GFR, EST NON AFRICAN AMERICAN: 86 mL/min
GFR, Est African American: >89 mL/min
Glucose, Bld: 105 mg/dL — ABNORMAL HIGH (ref 70–99)
Potassium: 4.2 mEq/L (ref 3.5–5.3)
Sodium: 141 mEq/L (ref 135–145)
TOTAL PROTEIN: 8 g/dL (ref 6.0–8.3)
Total Bilirubin: 0.4 mg/dL (ref 0.2–1.2)

## 2014-09-01 LAB — LIPID PANEL
Cholesterol: 173 mg/dL (ref 0–200)
HDL: 44 mg/dL — ABNORMAL LOW (ref >=46)
LDL Cholesterol: 113 mg/dL — ABNORMAL HIGH (ref 0–99)
Total CHOL/HDL Ratio: 3.9 Ratio
Triglycerides: 82 mg/dL (ref <150)
VLDL: 16 mg/dL (ref 0–40)

## 2014-09-01 LAB — MICROALBUMIN / CREATININE URINE RATIO
Creatinine, Urine: 157.8 mg/dL
Microalb Creat Ratio: 7 mg/g (ref 0.0–30.0)
Microalb, Ur: 1.1 mg/dL (ref <2.0)

## 2014-09-03 ENCOUNTER — Ambulatory Visit: Payer: Medicare Other | Admitting: Gastroenterology

## 2014-09-03 ENCOUNTER — Other Ambulatory Visit: Payer: Self-pay | Admitting: Family Medicine

## 2014-09-08 ENCOUNTER — Other Ambulatory Visit: Payer: Self-pay

## 2014-09-08 MED ORDER — METFORMIN HCL 1000 MG PO TABS: 1000.0000 mg | ORAL_TABLET | Freq: Two times a day (BID) | ORAL | Status: DC

## 2014-09-08 MED ORDER — ZOLPIDEM TARTRATE 10 MG PO TABS: 10.0000 mg | ORAL_TABLET | Freq: Every day | ORAL | Status: DC

## 2014-09-08 MED ORDER — GABAPENTIN 800 MG PO TABS: ORAL_TABLET | ORAL | Status: DC

## 2014-09-09 DIAGNOSIS — S46011D Strain of muscle(s) and tendon(s) of the rotator cuff of right shoulder, subsequent encounter: Secondary | ICD-10-CM | POA: Diagnosis not present

## 2014-09-09 DIAGNOSIS — Z4789 Encounter for other orthopedic aftercare: Secondary | ICD-10-CM | POA: Diagnosis not present

## 2014-09-16 ENCOUNTER — Ambulatory Visit: Payer: Medicare Other | Admitting: Family Medicine

## 2014-09-16 ENCOUNTER — Encounter: Payer: Self-pay | Admitting: *Deleted

## 2014-09-16 ENCOUNTER — Ambulatory Visit (INDEPENDENT_AMBULATORY_CARE_PROVIDER_SITE_OTHER): Payer: Medicare Other | Admitting: Family Medicine

## 2014-09-16 ENCOUNTER — Encounter: Payer: Self-pay | Admitting: Family Medicine

## 2014-09-16 VITALS — BP 150/84 | HR 72 | Resp 18 | Ht 64.0 in | Wt 293.1 lb

## 2014-09-16 DIAGNOSIS — M5440 Lumbago with sciatica, unspecified side: Secondary | ICD-10-CM

## 2014-09-16 DIAGNOSIS — E559 Vitamin D deficiency, unspecified: Secondary | ICD-10-CM

## 2014-09-16 DIAGNOSIS — E119 Type 2 diabetes mellitus without complications: Secondary | ICD-10-CM | POA: Diagnosis not present

## 2014-09-16 DIAGNOSIS — K219 Gastro-esophageal reflux disease without esophagitis: Secondary | ICD-10-CM

## 2014-09-16 DIAGNOSIS — J4540 Moderate persistent asthma, uncomplicated: Secondary | ICD-10-CM

## 2014-09-16 DIAGNOSIS — E785 Hyperlipidemia, unspecified: Secondary | ICD-10-CM | POA: Diagnosis not present

## 2014-09-16 DIAGNOSIS — I10 Essential (primary) hypertension: Secondary | ICD-10-CM

## 2014-09-16 DIAGNOSIS — F418 Other specified anxiety disorders: Secondary | ICD-10-CM

## 2014-09-16 MED ORDER — AMLODIPINE BESYLATE 10 MG PO TABS: 10.0000 mg | ORAL_TABLET | Freq: Every day | ORAL | Status: DC

## 2014-09-16 MED ORDER — CANAGLIFLOZIN 300 MG PO TABS: 300.0000 mg | ORAL_TABLET | Freq: Every day | ORAL | Status: DC

## 2014-09-16 MED ORDER — ATORVASTATIN CALCIUM 80 MG PO TABS: 80.0000 mg | ORAL_TABLET | Freq: Every day | ORAL | Status: DC

## 2014-09-16 NOTE — Assessment & Plan Note (Signed)
Controlled, no change in medication  

## 2014-09-16 NOTE — Assessment & Plan Note (Signed)
Uncontrolled increase dose of amlodipine  DASH diet and commitment to daily physical activity for a minimum of 30 minutes discussed and encouraged, as a part of hypertension management. The importance of attaining a healthy weight is also discussed.  BP/Weight 09/16/2014 05/11/2014 04/13/2014 03/19/2014 02/03/2014 01/27/2014 11/09/9288  Systolic BP 903 014 996 - 924 932 419  Diastolic BP 84 90 86 - 90 98 78  Wt. (Lbs) 293.08 295 284.12 283 - 282 285.12  BMI 50.28 50.61 48.75 48.55 - 48.38 46.71

## 2014-09-16 NOTE — Assessment & Plan Note (Signed)
Despite recent increase in stress earlier this week, adequate control, no med change

## 2014-09-16 NOTE — Assessment & Plan Note (Signed)
Updated lab needed at/ before next visit.   

## 2014-09-16 NOTE — Assessment & Plan Note (Signed)
Not at goal Hyperlipidemia:Low fat diet discussed and encouraged.   Lipid Panel  Lab Results  Component Value Date   CHOL 173 08/31/2014   HDL 44* 08/31/2014   LDLCALC 113* 08/31/2014   TRIG 82 08/31/2014   CHOLHDL 3.9 08/31/2014      Increase lipitor to 80 mg dose

## 2014-09-16 NOTE — Assessment & Plan Note (Signed)
Unchanged, chronic pain management with muscle relaxant to continue as before

## 2014-09-16 NOTE — Patient Instructions (Addendum)
Annual wellness in  3 month, call if you need me before  Nurse Bp check in 1 month  New additional med for blood sugar, invokana one daily, continue metformin as before  INCREASE lipitor dose to $Remov'80mg'lfHifs$  daily, OK to take TWO 40 mg tabs at bedtime till done  Increase in amlodipine to $RemoveBefor'10mg'seuatmtSIoHF$  daily, OK to take TWO 5 mg tablets daily  Till done  Fasting lipid, cmp and EGFr, HBA1C, vit D, magnesium, CBC, TSH and vit D in 3 month  Thanks for choosing [facility], we consider it a privelige to serve you.
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
## 2014-09-16 NOTE — Assessment & Plan Note (Signed)
Deteriorated. Patient re-educated about  the importance of commitment to a  minimum of 150 minutes of exercise per week.  The importance of healthy food choices with portion control discussed. Encouraged to start a food diary, count calories and to consider  joining a support group. Sample diet sheets offered. Goals set by the patient for the next several months.   Weight /BMI 09/16/2014 05/11/2014 04/13/2014  WEIGHT 293 lb 1.3 oz 295 lb 284 lb 1.9 oz  HEIGHT 5\' 4"  5\' 4"  5\' 4"   BMI 50.28 kg/m2 50.61 kg/m2 48.75 kg/m2    Current exercise per week 60 minutes.

## 2014-09-16 NOTE — Assessment & Plan Note (Signed)
Less well controlled, add invokana Patient educated about the importance of limiting  Carbohydrate intake , the need to commit to daily physical activity for a minimum of 30 minutes , and to commit weight loss.   Diabetic Labs Latest Ref Rng 08/31/2014 12/19/2013 08/26/2013 07/13/2013 04/22/2013  HbA1c <5.7 % 7.1(H) 6.7(H) 6.8(H) - 6.4(H)  Microalbumin <2.0 mg/dL 1.1 - 1.76 - 0.50  Micro/Creat Ratio 0.0 - 30.0 mg/g 7.0 - 6.8 - 5.0  Chol 0 - 200 mg/dL 173 159 - - 151  HDL >=46 mg/dL 44(L) 46 - - 42  Calc LDL 0 - 99 mg/dL 113(H) 95 - - 89  Triglycerides <150 mg/dL 82 88 - - 102  Creatinine 0.50 - 1.10 mg/dL 0.78 0.69 0.69 0.65 0.68   BP/Weight 09/16/2014 05/11/2014 04/13/2014 03/19/2014 02/03/2014 01/27/2014 5/62/5638  Systolic BP 937 342 876 - 811 572 620  Diastolic BP 84 90 86 - 90 98 78  Wt. (Lbs) 293.08 295 284.12 283 - 282 285.12  BMI 50.28 50.61 48.75 48.55 - 48.38 46.71   Foot/eye exam completion dates Latest Ref Rng 09/16/2014 06/02/2014  Eye Exam No Retinopathy - No Retinopathy  Foot Form Completion - Done -

## 2014-09-18 ENCOUNTER — Other Ambulatory Visit: Payer: Self-pay

## 2014-09-18 MED ORDER — OXYCODONE HCL ER 40 MG PO T12A: 40.0000 mg | EXTENDED_RELEASE_TABLET | Freq: Two times a day (BID) | ORAL | Status: DC

## 2014-09-21 ENCOUNTER — Encounter: Payer: Self-pay | Admitting: Gastroenterology

## 2014-09-21 ENCOUNTER — Other Ambulatory Visit: Payer: Self-pay

## 2014-09-21 ENCOUNTER — Ambulatory Visit (INDEPENDENT_AMBULATORY_CARE_PROVIDER_SITE_OTHER): Payer: Medicare Other | Admitting: Gastroenterology

## 2014-09-21 VITALS — BP 149/90 | HR 75 | Temp 97.6°F | Ht 64.0 in | Wt 296.0 lb

## 2014-09-21 DIAGNOSIS — Z1211 Encounter for screening for malignant neoplasm of colon: Secondary | ICD-10-CM | POA: Insufficient documentation

## 2014-09-21 MED ORDER — PEG-KCL-NACL-NASULF-NA ASC-C 100 G PO SOLR: 1.0000 | ORAL | Status: DC

## 2014-09-21 NOTE — Patient Instructions (Addendum)
CLEAR LIQUIDS ON DAY BEFORE COLONOSCOPY IN JUN 2016.   DRINK WATER TO KEEP YOUR URINE LIGHT YELLOW.  FOLLOW A HIGH FIBER DIET. AVOID ITEMS THAT CAUSE BLOATING & GAS. SEE INFO BELOW.  High-Fiber Diet A high-fiber diet changes your normal diet to include more whole grains, legumes, fruits, and vegetables. Changes in the diet involve replacing refined carbohydrates with unrefined foods. The calorie level of the diet is essentially unchanged. The Dietary Reference Intake (recommended amount) for adult males is 38 grams per day. For adult females, it is 25 grams per day. Pregnant and lactating women should consume 28 grams of fiber per day. Fiber is the intact part of a plant that is not broken down during digestion. Functional fiber is fiber that has been isolated from the plant to provide a beneficial effect in the body. PURPOSE  Increase stool bulk.   Ease and regulate bowel movements.   Lower cholesterol.  INDICATIONS THAT YOU NEED MORE FIBER  Constipation and hemorrhoids.   Uncomplicated diverticulosis (intestine condition) and irritable bowel syndrome.   Weight management.   As a protective measure against hardening of the arteries (atherosclerosis), diabetes, and cancer.   GUIDELINES FOR INCREASING FIBER IN THE DIET  Start adding fiber to the diet slowly. A gradual increase of about 5 more grams (2 slices of whole-wheat bread, 2 servings of most fruits or vegetables, or 1 bowl of high-fiber cereal) per day is best. Too rapid an increase in fiber may result in constipation, flatulence, and bloating.   Drink enough water and fluids to keep your urine clear or pale yellow. Water, juice, or caffeine-free drinks are recommended. Not drinking enough fluid may cause constipation.   Eat a variety of high-fiber foods rather than one type of fiber.   Try to increase your intake of fiber through using high-fiber foods rather than fiber pills or supplements that contain small amounts of  fiber.   The goal is to change the types of food eaten. Do not supplement your present diet with high-fiber foods, but replace foods in your present diet.  INCLUDE A VARIETY OF FIBER SOURCES  Replace refined and processed grains with whole grains, canned fruits with fresh fruits, and incorporate other fiber sources. White rice, white breads, and most bakery goods contain little or no fiber.   Brown whole-grain rice, buckwheat oats, and many fruits and vegetables are all good sources of fiber. These include: broccoli, Brussels sprouts, cabbage, cauliflower, beets, sweet potatoes, white potatoes (skin on), carrots, tomatoes, eggplant, squash, berries, fresh fruits, and dried fruits.   Cereals appear to be the richest source of fiber. Cereal fiber is found in whole grains and bran. Bran is the fiber-rich outer coat of cereal grain, which is largely removed in refining. In whole-grain cereals, the bran remains. In breakfast cereals, the largest amount of fiber is found in those with "bran" in their names. The fiber content is sometimes indicated on the label.   You may need to include additional fruits and vegetables each day.   In baking, for 1 cup white flour, you may use the following substitutions:   1 cup whole-wheat flour minus 2 tablespoons.   1/2 cup white flour plus 1/2 cup whole-wheat flour.

## 2014-09-21 NOTE — Assessment & Plan Note (Signed)
PT UNSURE IF FATHER HAD COLON OR PROSTATE.  TCS W/ MAC DUE TO POLYPHARMACY AND BMI > 50. DISCUSSED PROCEDURE, AND BENEFITS  OF ENDOSCOPY. EAT FIBER DRINK WATER OPV TBS AFTER TCS

## 2014-09-21 NOTE — Progress Notes (Signed)
Subjective:    [name], female    DOB: Jan 31, 1961, 54 y.o.   MRN: [id number]  Angela Nakayama, MD  HPI NEEDS SCREENING COLONOSCOPY. KNOWN HISTORY HX: INTERNAL HEMORRHOIDS. RARE BRBPR. STAYS NAUDEATED. NO DIFFERENT. BMS:  #1 OR #4-Q2-3 DAYS. SOB WHEN SHE WALKS.   PT DENIES FEVER, CHILLS, , nausea, vomiting, melena, diarrhea, CHEST PAIN, CHANGE IN BOWEL IN HABITS, constipation, abdominal pain, problems swallowing, OR  heartburn or indigestion.   Past Medical History  Diagnosis Date  . Low back pain   . Helicobacter pylori gastritis 2008  . Severe obesity (BMI >= 40)   . GERD (gastroesophageal reflux disease)   . H/O hiatal hernia   . Anxiety   . Depression   . Asthma   . Essential hypertension, benign   . Hypercholesterolemia   . Degenerative disc disease   . Dysrhythmia   . Sleep apnea     STOP BANG SCORE 6no cpap used  . Migraine   . Arthritis   . Type 2 diabetes mellitus with diabetic neuropathy   . Nephrolithiasis     Recurring episodes since 2004  . Hyperthyroidism     s/p radiation  . Coronary atherosclerosis of native coronary artery     Mild nonobstructive CAD by cardiac catheterization 2008 - Dr. Terrence Dupont   Past Surgical History  Procedure Laterality Date  . Cholecystectomy  1996  . Left knee arthroscopic surgery  1999  . Left salphingectomy secondary to ectopic pregnancy  1991  . Partial hysterectomy  1991  . Knee arthroscopy  10/04/2010,     right knee arthroscopy, dr Theda Sers  . Upper gastrointestinal endoscopy  2008 abd pain    H. pylori gastritis  . Upper gastrointestinal endoscopy  1996 [place]    PUD  . Colonoscopy  2000 BRBPR    INT HEMORRHOIDS/FISSURE  . Colonoscopy  2003 BRBPR, CHANGE IN BOWEL HABITS    INT HEMORRHOIDS  . Colonoscopy  2006 BRBPR    INT HEMORRHOIDS  . Colonoscopy  2007 [facility]    INT HEMORRHOIDS  . Esophagogastroduodenoscopy  2008    [id number] hiatal hernia./Normal esophagus without evidence of Barrett's  mass, stricture, erosion or ulceration./Normal duodenal bulb and second portion of the duodenum./Diffuse erythema in the body and the antrum with occasional erosion.  Biopsies obtained via cold forceps to evaluate for H. pylori gastritis  . Back surgery  1993  . Back surgery  1994  . Back surgery  2002  . Back surgery  2011    [facility]   . Cystoscopy/retrograde/ureteroscopy  12/04/2011    Procedure: CYSTOSCOPY/RETROGRADE/URETEROSCOPY;  Surgeon: Molli Hazard, MD;  Location: [facility];  Service: Urology;  Laterality: Right;  . Abdominal hysterectomy    . Cystoscopy with stent placement Left 07/25/2012    Procedure: CYSTOSCOPY, left retograde pyelogram WITH left ureteral  STENT PLACEMENT;  Surgeon: Claybon Jabs, MD;  Location: [facility];  Service: Urology;  Laterality: Left;  . Fracture surgery  1999    right clavicle  . Holmium laser application Left 06/09/9369    Procedure: HOLMIUM LASER APPLICATION;  Surgeon: Molli Hazard, MD;  Location: [facility];  Service: Urology;  Laterality: Left;  . Cystoscopy/retrograde/ureteroscopy/stone extraction with basket Left 09/03/2012    Procedure: CYSTOSCOPY/RETROGRADE pyelogram/digital URETEROSCOPY/STONE EXTRACTION WITH BASKET, left stent removal;  Surgeon: Molli Hazard, MD;  Location: [facility];  Service: Urology;  Laterality: Left;   Allergies  Allergen Reactions  . Morphine Other (See Comments)  Reaction with esophagus, unable to swallow.   . Ace Inhibitors Cough  . Aspirin Other (See Comments)    Reaction with esophagus, unable to swallow.   . Losartan Cough  . Tomato Other (See Comments)    Acid reflux due to acid in tomato  . Adhesive [Tape] Rash    Current Outpatient Prescriptions  Medication Sig Dispense Refill  . albuterol (PROAIR HFA) 108 (90 BASE) MCG/ACT inhaler Inhale 2 puffs into the lungs every 6 (six) hours as needed for wheezing or shortness of breath.    Marland Kitchen amLODipine (NORVASC) 10 MG tablet Take 1 tablet (10 mg total) by  mouth daily.    Marland Kitchen atorvastatin (LIPITOR) 80 MG tablet Take 1 tablet (80 mg total) by mouth daily.    . canagliflozin (INVOKANA) 300 MG TABS tablet Take 300 mg by mouth daily before breakfast.    . cloNIDine (CATAPRES) 0.3 MG tablet TAKE ONE TABLET AT 9 PM EVERY NIGHT FOR HIGH BLOOD PRESSURE    . diazepam (VALIUM) 5 MG tablet TAKE 1 TABLET 3 TIMES A DAY    . ergocalciferol (VITAMIN D2) 50000 UNITS capsule Take 50,000 Units by mouth once a week. Takes on Monday    . esomeprazole (NEXIUM) 40 MG capsule TAKE 1 CAPSULE (40 MG TOTAL) BY MOUTH DAILY.    Marland Kitchen FLUoxetine (PROZAC) 40 MG capsule TAKE ONE CAPSULE BY MOUTH EVERY DAY    . furosemide (LASIX) 40 MG tablet TAKE 1 TABLET BY MOUTH TWICE A DAY    . gabapentin (NEURONTIN) 800 MG tablet TAKE 1 TABLET BY MOUTH 4 TIMES A DAY    . KLOR-CON M20 20 MEQ tablet TAKE 1 TABLET (20 MEQ TOTAL) BY MOUTH 2 (TWO) TIMES DAILY.    . metFORMIN (GLUCOPHAGE) 1000 MG tablet Take 1 tablet (1,000 mg total) by mouth 2 (two) times daily with a meal.    . nitroGLYCERIN (NITROLINGUAL) 0.4 MG/SPRAY spray Place 1 spray under the tongue every 5 (five) minutes as needed. For chest pain    . nystatin (MYCOSTATIN/NYSTOP) 100000 UNIT/GM POWD Apply to area twice daily as needed for rash    . nystatin-triamcinolone ointment (MYCOLOG) Apply 1 application topically 2 (two) times daily.    . OxyCODONE (OXYCONTIN) 40 mg T12A 12 hr tablet Take 1 tablet (40 mg total) by mouth every 12 (twelve) hours.    . potassium chloride SA (K-DUR,KLOR-CON) 20 MEQ tablet Take 1 tablet (20 mEq total) by mouth 2 (two) times daily.    Marland Kitchen spironolactone (ALDACTONE) 100 MG tablet Take 0.5 tablets (50 mg total) by mouth daily.    Marland Kitchen terbinafine (LAMISIL) 1 % cream Apply 1 application topically 2 (two) times daily.    Marland Kitchen tiZANidine (ZANAFLEX) 4 MG tablet Take 1 tablet (4 mg total) by mouth 3 (three) times daily.    Marland Kitchen triamterene-hydrochlorothiazide (MAXZIDE) 75-50 MG per tablet TAKE 1 TABLET BY MOUTH DAILY.    Marland Kitchen  zolpidem (AMBIEN) 10 MG tablet Take 1 tablet (10 mg total) by mouth at bedtime.    .       Family History  Problem Relation Age of Onset  . Heart disease Mother   . Hypertension Mother   . Cancer Mother     Cervical   . Heart attack Father   . Cancer Father     Prostate  . Colon cancer Father     DECEASED AGE 12  . Colon polyps Neg Hx     History  Substance Use Topics  . Smoking status:  Never Smoker   . Smokeless tobacco: Never Used  . Alcohol Use: No    Review of Systems     Objective:   Physical Exam  Constitutional: She is oriented to person, place, and time. She appears well-developed and well-nourished. No distress.  HENT:  Head: Normocephalic and atraumatic.  Mouth/Throat: Oropharynx is clear and moist. No oropharyngeal exudate.  Eyes: Pupils are equal, round, and reactive to light. No scleral icterus.  Neck: Normal range of motion. Neck supple.  Cardiovascular: Normal rate, regular rhythm and normal heart sounds.   Pulmonary/Chest: Effort normal and breath sounds normal. No respiratory distress.  Abdominal: Soft. Bowel sounds are normal. She exhibits no distension. There is no tenderness.  Musculoskeletal: She exhibits no edema.  Lymphadenopathy:    She has no cervical adenopathy.  Neurological: She is alert and oriented to person, place, and time.  Psychiatric: She has a normal mood and affect.  Vitals reviewed.         Assessment & Plan:

## 2014-09-22 NOTE — Progress Notes (Signed)
cc'ed to pcp °

## 2014-10-01 ENCOUNTER — Telehealth: Payer: Self-pay

## 2014-10-01 DIAGNOSIS — L609 Nail disorder, unspecified: Secondary | ICD-10-CM | POA: Diagnosis not present

## 2014-10-01 DIAGNOSIS — L110 Acquired keratosis follicularis: Secondary | ICD-10-CM | POA: Diagnosis not present

## 2014-10-01 NOTE — Telephone Encounter (Signed)
Needs to be in direct contact the specialist who already started the eval, let them know her current situation anmd they will address, I have not evaluated it, and I woiuld expect derm to be more appropriate to treat

## 2014-10-01 NOTE — Telephone Encounter (Signed)
Has a red quarter sized itchy spot on the shin of her right leg. Didn't feel raised or bumpy.  States it has been there for a month and looks like its gotten bigger. Seen [facility] a few weeks ago and they didn't know what it was but its still there and she wants something for it. Has been using neosporin with no relief

## 2014-10-01 NOTE — Telephone Encounter (Signed)
Called patient no option to leave message

## 2014-10-02 NOTE — Telephone Encounter (Signed)
Called pt- no option to leave message.

## 2014-10-07 ENCOUNTER — Telehealth: Payer: Self-pay

## 2014-10-07 NOTE — Telephone Encounter (Signed)
Pt called move her TCS because she does not have a ride for the 14th. Her new appointment date for her is 10/20/14 @ 8:45 am and her prep-op appointment is 10/14/14 @ 10:00 am. She is aware of new appointments and dates.

## 2014-10-07 NOTE — Telephone Encounter (Signed)
REVIEWED-NO ADDITIONAL RECOMMENDATIONS. 

## 2014-10-08 ENCOUNTER — Encounter (HOSPITAL_COMMUNITY): Payer: Medicare Other

## 2014-10-10 ENCOUNTER — Other Ambulatory Visit: Payer: Self-pay | Admitting: Family Medicine

## 2014-10-12 ENCOUNTER — Other Ambulatory Visit: Payer: Self-pay | Admitting: Family Medicine

## 2014-10-14 ENCOUNTER — Other Ambulatory Visit: Payer: Self-pay

## 2014-10-14 ENCOUNTER — Encounter (HOSPITAL_COMMUNITY)
Admission: RE | Admit: 2014-10-14 | Discharge: 2014-10-14 | Disposition: A | Discharge status: Home / Self Care | Payer: Medicare Other | Source: Ambulatory Visit | Attending: Gastroenterology | Admitting: Gastroenterology

## 2014-10-14 ENCOUNTER — Encounter (HOSPITAL_COMMUNITY): Payer: Self-pay

## 2014-10-14 DIAGNOSIS — Z4789 Encounter for other orthopedic aftercare: Secondary | ICD-10-CM | POA: Diagnosis not present

## 2014-10-14 DIAGNOSIS — Z01818 Encounter for other preprocedural examination: Secondary | ICD-10-CM | POA: Insufficient documentation

## 2014-10-14 DIAGNOSIS — S46011D Strain of muscle(s) and tendon(s) of the rotator cuff of right shoulder, subsequent encounter: Secondary | ICD-10-CM | POA: Diagnosis not present

## 2014-10-14 LAB — BASIC METABOLIC PANEL
Anion gap: 7 (ref 5–15)
BUN: 19 mg/dL (ref 6–20)
CALCIUM: 9.2 mg/dL (ref 8.9–10.3)
CO2: 30 mmol/L (ref 22–32)
CREATININE: 0.84 mg/dL (ref 0.44–1.00)
Chloride: 102 mmol/L (ref 101–111)
GFR calc Af Amer: >60 mL/min (ref >60)
GFR calc non Af Amer: >60 mL/min (ref >60)
Glucose, Bld: 119 mg/dL — ABNORMAL HIGH (ref 65–99)
Potassium: 3.9 mmol/L (ref 3.5–5.1)
Sodium: 139 mmol/L (ref 135–145)

## 2014-10-14 LAB — CBC
HCT: 39 % (ref 36.0–46.0)
HEMOGLOBIN: 12.4 g/dL (ref 12.0–15.0)
MCH: 26.2 pg (ref 26.0–34.0)
MCHC: 31.8 g/dL (ref 30.0–36.0)
MCV: 82.3 fL (ref 78.0–100.0)
Platelets: 391 10*3/uL (ref 150–400)
RBC: 4.74 MIL/uL (ref 3.87–5.11)
RDW: 14.9 % (ref 11.5–15.5)
WBC: 5.7 10*3/uL (ref 4.0–10.5)

## 2014-10-14 NOTE — Patient Instructions (Addendum)
Angela [name]  10/14/2014        Your procedure is scheduled on 10/20/2014  Report to [facility] at 10:45 A.M.  Call this number if you have problems the morning of surgery:  [phone]   Remember:  Do not eat food or drink liquids after midnight.  Take these medicines the morning of surgery with A SIP OF WATER - Pro Air, Symbicort, Oxycontin, Albuterol, Amlodipine, Clonidine, Nexium,  Prozac   DO NOT TAKE INVOKANA OR METFORMIN AM OF PROCEDURE  BRING INHALERS WITH YOU   Do not wear jewelry, make-up or nail polish.  Do not wear lotions, powders, or perfumes.  You may wear deodorant.  Do not shave 48 hours prior to surgery.  Men may shave face and neck.  Do not bring valuables to the hospital.  [facility] is not responsible for any belongings or valuables.  Contacts, dentures or bridgework may not be worn into surgery.  Leave your suitcase in the car.  After surgery it may be brought to your room.  For patients admitted to the hospital, discharge time will be determined by your treatment team.  Patients discharged the day of surgery will not be allowed to drive home.   Special instructions:    Please read over the following fact sheets that you were given. Anesthesia Post-op Instructions          PATIENT INSTRUCTIONS POST-ANESTHESIA  IMMEDIATELY FOLLOWING SURGERY:  Do not drive or operate machinery for the first twenty four hours after surgery.  Do not make any important decisions for twenty four hours after surgery or while taking narcotic pain medications or sedatives.  If you develop intractable nausea and vomiting or a severe headache please notify your doctor immediately.  FOLLOW-UP:  Please make an appointment with your surgeon as instructed. You do not need to follow up with anesthesia unless specifically instructed to do so.  WOUND CARE INSTRUCTIONS (if applicable):  Keep a dry clean dressing on the anesthesia/puncture wound site if there is  drainage.  Once the wound has quit draining you may leave it open to air.  Generally you should leave the bandage intact for twenty four hours unless there is drainage.  If the epidural site drains for more than 36-48 hours please call the anesthesia department.  QUESTIONS?:  Please feel free to call your physician or the hospital operator if you have any questions, and they will be happy to assist you.       Colonoscopy A colonoscopy is an exam to look at the entire large intestine (colon). This exam can help find problems such as tumors, polyps, inflammation, and areas of bleeding. The exam takes about 1 hour.  LET [facility] CARE PROVIDER KNOW ABOUT:   Any allergies you have.  All medicines you are taking, including vitamins, herbs, eye drops, creams, and over-the-counter medicines.  Previous problems you or members of your family have had with the use of anesthetics.  Any blood disorders you have.  Previous surgeries you have had.  Medical conditions you have. RISKS AND COMPLICATIONS  Generally, this is a safe procedure. However, as with any procedure, complications can occur. Possible complications include:  Bleeding.  Tearing or rupture of the colon wall.  Reaction to medicines given during the exam.  Infection (rare). BEFORE THE PROCEDURE   Ask your health care provider about changing or stopping your regular medicines.  You may be prescribed an oral bowel prep. This involves drinking a  large amount of medicated liquid, starting the day before your procedure. The liquid will cause you to have multiple loose stools until your stool is almost clear or light green. This cleans out your colon in preparation for the procedure.  Do not eat or drink anything else once you have started the bowel prep, unless your health care provider tells you it is safe to do so.  Arrange for someone to drive you home after the procedure. PROCEDURE   You will be given medicine to help you  relax (sedative).  You will lie on your side with your knees bent.  A long, flexible tube with a light and camera on the end (colonoscope) will be inserted through the rectum and into the colon. The camera sends video back to a computer screen as it moves through the colon. The colonoscope also releases carbon dioxide gas to inflate the colon. This helps your health care provider see the area better.  During the exam, your health care provider may take a small tissue sample (biopsy) to be examined under a microscope if any abnormalities are found.  The exam is finished when the entire colon has been viewed. AFTER THE PROCEDURE   Do not drive for 24 hours after the exam.  You may have a small amount of blood in your stool.  You may pass moderate amounts of gas and have mild abdominal cramping or bloating. This is caused by the gas used to inflate your colon during the exam.  Ask when your test results will be ready and how you will get your results. Make sure you get your test results. Document Released: 04/14/2000 Document Revised: 02/05/2013 Document Reviewed: 12/23/2012 [facility] Patient Information 2015 [place], [place]. This information is not intended to replace advice given to you by your health care provider. Make sure you discuss any questions you have with your health care provider.

## 2014-10-15 NOTE — Pre-Procedure Instructions (Signed)
Attempted to contact patient regarding arrival time change from 7:15 to 10:45.  Will continue to contact.

## 2014-10-16 NOTE — Pre-Procedure Instructions (Signed)
Second attempt to contact patient regarding change in arrival time to 10:45 on 6/21.  Unable to reach her.  Message has been left for daughter and Dr Oneida Alar office.

## 2014-10-19 ENCOUNTER — Telehealth: Payer: Self-pay | Admitting: Family Medicine

## 2014-10-19 ENCOUNTER — Other Ambulatory Visit: Payer: Self-pay

## 2014-10-19 MED ORDER — PEG 3350-KCL-NA BICARB-NACL 420 G PO SOLR
4000.0000 mL | ORAL | Status: DC
Start: 2014-10-19 — End: 2015-01-21

## 2014-10-20 ENCOUNTER — Ambulatory Visit (HOSPITAL_COMMUNITY): Admission: RE | Admit: 2014-10-20 | Payer: Medicare Other | Source: Ambulatory Visit | Admitting: Gastroenterology

## 2014-10-20 ENCOUNTER — Encounter (HOSPITAL_COMMUNITY): Admission: RE | Payer: Self-pay | Source: Ambulatory Visit

## 2014-10-20 ENCOUNTER — Telehealth: Payer: Self-pay

## 2014-10-20 SURGERY — COLONOSCOPY WITH PROPOFOL
Anesthesia: Monitor Anesthesia Care

## 2014-10-20 NOTE — OR Nursing (Signed)
Patient cancelled procedure because she states she can't drink prep.

## 2014-10-20 NOTE — Telephone Encounter (Signed)
Pt called this morning to cancel her TCS. She said that she talked with [facility] last night because she was vomiting everything up last night. She will have to call back at a later date to rescheduled.

## 2014-10-20 NOTE — Telephone Encounter (Signed)
REVIEWED-NO ADDITIONAL RECOMMENDATIONS. 

## 2014-10-20 NOTE — Telephone Encounter (Signed)
Med refilled.

## 2014-10-23 ENCOUNTER — Other Ambulatory Visit: Payer: Self-pay

## 2014-10-23 MED ORDER — OXYCODONE HCL ER 40 MG PO T12A: 40.0000 mg | EXTENDED_RELEASE_TABLET | Freq: Two times a day (BID) | ORAL | Status: DC

## 2014-10-26 ENCOUNTER — Ambulatory Visit: Payer: Medicare Other

## 2014-10-26 VITALS — BP 132/82 | Wt 296.0 lb

## 2014-10-26 DIAGNOSIS — I10 Essential (primary) hypertension: Secondary | ICD-10-CM

## 2014-10-26 NOTE — Progress Notes (Signed)
Patient advised BP had improved and to keep her regular follow up appt and continue the same meds

## 2014-10-27 ENCOUNTER — Other Ambulatory Visit: Payer: Self-pay | Admitting: Family Medicine

## 2014-10-27 DIAGNOSIS — Z1231 Encounter for screening mammogram for malignant neoplasm of breast: Secondary | ICD-10-CM

## 2014-10-28 ENCOUNTER — Other Ambulatory Visit: Payer: Self-pay | Admitting: Family Medicine

## 2014-11-03 ENCOUNTER — Encounter (HOSPITAL_COMMUNITY): Payer: Self-pay

## 2014-11-03 NOTE — Therapy (Unsigned)
[facility] [address] [place], [place], [address] Phone: [phone]   Fax:  [phone]  Patient Details  Name: Angela [name] MRN: [id number] Date of Birth: 01-19-1961 Referring Provider:  No ref. provider found  Encounter Date: 11/03/2014 OCCUPATIONAL THERAPY DISCHARGE SUMMARY  Visits from Start of Care: 13  Current functional level related to goals / functional outcomes: OT LONG TERM GOAL #1    Title Patient will return to highest level of independence with all daily, leisure, and work related tasks.    Status On-going   OT LONG TERM GOAL #2   Title Patient will increase AROM of RUE to WNL to increase ability to complete overhead tasks with less difficulty   Status On-going   OT LONG TERM GOAL #3   Title Patient will increase RUE strength to 4/5 to increase ability to hold gun and complete work related tasks as needed.    Status On-going   OT LONG TERM GOAL #4   Title Patient will decrease fascial restrictions from min to trace amount.    Status On-going   OT LONG TERM GOAL #5   Title Patient will decrease pain to 3/10 during daily tasks.    Status On-going          Remaining deficits: Patient's last appt with therapy was 08/13/14 and has not returned since. Pt has not met therapy goals.   Education / Equipment: Table slides and AAROM Plan:                                                    Patient goals were not met. Patient is being discharged due to not returning since the last visit.  ?????       Ailene Ravel, OTR/L,CBIS  [phone]  11/03/2014, 8:49 AM  [facility] [address] [place], [place], [address] Phone: [phone]   Fax:  [phone]

## 2014-11-04 ENCOUNTER — Telehealth: Payer: Self-pay | Admitting: Family Medicine

## 2014-11-04 ENCOUNTER — Other Ambulatory Visit: Payer: Self-pay

## 2014-11-04 MED ORDER — PROMETHAZINE-DM 6.25-15 MG/5ML PO SYRP: ORAL_SOLUTION | ORAL | Status: DC

## 2014-11-04 NOTE — Telephone Encounter (Signed)
Are you ok with refilling last prescribed cough med?

## 2014-11-04 NOTE — Telephone Encounter (Signed)
Med refilled.

## 2014-11-04 NOTE — Telephone Encounter (Signed)
pls refillx 1 

## 2014-11-11 ENCOUNTER — Ambulatory Visit (INDEPENDENT_AMBULATORY_CARE_PROVIDER_SITE_OTHER): Payer: Medicare Other | Admitting: Family Medicine

## 2014-11-11 ENCOUNTER — Encounter: Payer: Self-pay | Admitting: Family Medicine

## 2014-11-11 VITALS — BP 134/84 | HR 86 | Resp 16 | Ht 64.0 in | Wt 302.0 lb

## 2014-11-11 DIAGNOSIS — Z0000 Encounter for general adult medical examination without abnormal findings: Secondary | ICD-10-CM | POA: Diagnosis not present

## 2014-11-11 DIAGNOSIS — E559 Vitamin D deficiency, unspecified: Secondary | ICD-10-CM

## 2014-11-11 DIAGNOSIS — E785 Hyperlipidemia, unspecified: Secondary | ICD-10-CM

## 2014-11-11 DIAGNOSIS — I10 Essential (primary) hypertension: Secondary | ICD-10-CM

## 2014-11-11 DIAGNOSIS — Z113 Encounter for screening for infections with a predominantly sexual mode of transmission: Secondary | ICD-10-CM

## 2014-11-11 DIAGNOSIS — E119 Type 2 diabetes mellitus without complications: Secondary | ICD-10-CM

## 2014-11-11 DIAGNOSIS — G4730 Sleep apnea, unspecified: Secondary | ICD-10-CM

## 2014-11-11 NOTE — Patient Instructions (Addendum)
F/u in mid September , call if you need me before]  You are referred to nutritiionist  You are referred for sleep eval so your sleep apnea is treated  Pls call when ready to go for therapy  Please work on good  health habits so that your health will improve. 1. Commitment to daily physical activity for 30 to 60  minutes, if you are able to do this.  2. Commitment to wise food choices. Aim for half of your  food intake to be vegetable and fruit, one quarter starchy foods, and one quarter protein. Try to eat on a regular schedule  3 meals per day, snacking between meals should be limited to vegetables or fruits or small portions of nuts. 64 ounces of water per day is generally recommended, unless you have specific health conditions, like heart failure or kidney failure where you will need to limit fluid intake.  3. Commitment to sufficient and a  good quality of physical and mental rest daily, generally between 6 to 8 hours per day.  WITH PERSISTANCE AND PERSEVERANCE, THE IMPOSSIBLE , BECOMES THE NORM!  HBa1C, fasting lipid, cmp and eGFr, HIV, TSH and Vit D mid sept  PLS reduce eggs and change eaing

## 2014-11-11 NOTE — Assessment & Plan Note (Signed)

## 2014-11-11 NOTE — Progress Notes (Signed)
Subjective:    [name], female    DOB: [birth year], 54 y.o.   MRN: [id number]  HPI Preventive Screening-Counseling & Management   Patient present here today for a Medicare annual wellness visit.   Current Problems (verified)   Medications Prior to Visit Allergies (verified)   PAST HISTORY  Family History (verified)   Social History  Divorced, 1 daughter, lives alone, never a smoker. Disabled in 1998 chronic back pain. Currently a  Part time security guard at [employer]    Risk Factors  Current exercise habits:  Walks on her job 24 hours a week   Dietary issues discussed: Heart healthy diet discussed, eating more fruits and vegetables, encouraged to limit carbs and fried foods    Cardiac risk factors: Type 2 DM   Depression Screen  (Note: if answer to either of the following is "Yes", a more complete depression screening is indicated)   Over the past two weeks, have you felt down, depressed or hopeless? yes Over the past two weeks, have you felt little interest or pleasure in doing things? yes  Have you lost interest or pleasure in daily life? No  Do you often feel hopeless? yes Do you cry easily over simple problems? No   Activities of Daily Living  In your present state of health, do you have any difficulty performing the following activities?  Driving?: No Managing money?: No Feeding yourself?:No Getting from bed to chair?:No Climbing a flight of stairs?:Not too well  Preparing food and eating?:No Bathing or showering?:No Getting dressed?:No Getting to the toilet?:No Using the toilet?:No Moving around from place to place?: sometimes she uses a cane to get around   Fall Risk Assessment In the past year have you fallen or had a near fall?: once  Are you currently taking any medications that make you dizzy?:No   Hearing Difficulties: No Do you often ask people to speak up or repeat themselves?:No Do you experience ringing or noises in your  ears?:No Do you have difficulty understanding soft or whispered voices?:No  Cognitive Testing  Alert? Yes Normal Appearance?Yes  Oriented to person? Yes Place? Yes  Time? Yes  Displays appropriate judgment?Yes  Can read the correct time from a watch face? yes Are you having problems remembering things?No  Advanced Directives have been discussed with the patient?Yes, and she has a living will    List the Names of Other Physician/Practitioners you currently use:  None   Indicate any recent Medical Services you may have received from other than [facility] providers in the past year (date may be approximate).   Assessment:    Annual Wellness Exam   Plan:    .  Medicare Attestation  I have personally reviewed:  The patient's medical and social history  Their use of alcohol, tobacco or illicit drugs  Their current medications and supplements  The patient's functional ability including ADLs,fall risks, home safety risks, cognitive, and hearing and visual impairment  Diet and physical activities  Evidence for depression or mood disorders  The patient's weight, height, BMI, and visual acuity have been recorded in the chart. I have made referrals, counseling, and provided education to the patient based on review of the above and I have provided the patient with a written personalized care plan for preventive services.      Review of Systems     Objective:   Physical Exam  BP 134/84 mmHg  Pulse 86  Resp 16  Ht 5\' 4"  (1.626 m)  Wt 302 lb (136.986 kg)  BMI 51.81 kg/m2  SpO2 97%       Assessment & Plan:  Medicare annual wellness visit, subsequent Annual exam as documented. Counseling done  re healthy lifestyle involving commitment to 150 minutes exercise per week, heart healthy diet, and attaining healthy weight.The importance of adequate sleep also discussed. Regular seat belt use and home safety, is also discussed. Changes in health habits are decided on by the patient with  goals and time frames  set for achieving them. Immunization and cancer screening needs are specifically addressed at this visit.

## 2014-11-12 ENCOUNTER — Ambulatory Visit (HOSPITAL_COMMUNITY): Payer: Self-pay

## 2014-11-13 ENCOUNTER — Other Ambulatory Visit: Payer: Self-pay | Admitting: Family Medicine

## 2014-11-15 ENCOUNTER — Encounter: Payer: Self-pay | Admitting: Family Medicine

## 2014-11-16 ENCOUNTER — Ambulatory Visit (HOSPITAL_COMMUNITY): Payer: Self-pay

## 2014-11-20 ENCOUNTER — Other Ambulatory Visit: Payer: Self-pay

## 2014-11-20 MED ORDER — OXYCODONE HCL ER 40 MG PO T12A: 40.0000 mg | EXTENDED_RELEASE_TABLET | Freq: Two times a day (BID) | ORAL | Status: DC

## 2014-11-25 DIAGNOSIS — Z90710 Acquired absence of both cervix and uterus: Secondary | ICD-10-CM | POA: Diagnosis not present

## 2014-11-25 DIAGNOSIS — R102 Pelvic and perineal pain: Secondary | ICD-10-CM | POA: Diagnosis not present

## 2014-11-25 DIAGNOSIS — N838 Other noninflammatory disorders of ovary, fallopian tube and broad ligament: Secondary | ICD-10-CM | POA: Diagnosis not present

## 2014-11-25 DIAGNOSIS — Z113 Encounter for screening for infections with a predominantly sexual mode of transmission: Secondary | ICD-10-CM | POA: Diagnosis not present

## 2014-11-25 DIAGNOSIS — Z90721 Acquired absence of ovaries, unilateral: Secondary | ICD-10-CM | POA: Diagnosis not present

## 2014-11-26 ENCOUNTER — Other Ambulatory Visit: Payer: Self-pay | Admitting: Family Medicine

## 2014-11-27 ENCOUNTER — Other Ambulatory Visit: Payer: Self-pay | Admitting: Family Medicine

## 2014-12-01 ENCOUNTER — Ambulatory Visit: Payer: Self-pay

## 2014-12-02 ENCOUNTER — Ambulatory Visit: Payer: Self-pay

## 2014-12-10 ENCOUNTER — Encounter: Payer: Self-pay | Admitting: Adult Health

## 2014-12-11 NOTE — Addendum Note (Signed)
Addended by: Denman George B on: 12/11/2014 01:28 PM   Modules accepted: Orders

## 2014-12-18 ENCOUNTER — Other Ambulatory Visit: Payer: Self-pay

## 2014-12-18 MED ORDER — OXYCODONE HCL ER 40 MG PO T12A: 40.0000 mg | EXTENDED_RELEASE_TABLET | Freq: Two times a day (BID) | ORAL | Status: DC

## 2014-12-21 ENCOUNTER — Encounter: Payer: Self-pay | Admitting: Adult Health

## 2014-12-23 ENCOUNTER — Other Ambulatory Visit: Payer: Self-pay | Admitting: Family Medicine

## 2014-12-28 ENCOUNTER — Encounter: Payer: Self-pay | Admitting: Adult Health

## 2014-12-31 DIAGNOSIS — Z4789 Encounter for other orthopedic aftercare: Secondary | ICD-10-CM | POA: Diagnosis not present

## 2014-12-31 DIAGNOSIS — S46011D Strain of muscle(s) and tendon(s) of the rotator cuff of right shoulder, subsequent encounter: Secondary | ICD-10-CM | POA: Diagnosis not present

## 2014-12-31 DIAGNOSIS — M7541 Impingement syndrome of right shoulder: Secondary | ICD-10-CM | POA: Diagnosis not present

## 2015-01-01 ENCOUNTER — Ambulatory Visit (INDEPENDENT_AMBULATORY_CARE_PROVIDER_SITE_OTHER): Payer: Medicare Other | Admitting: Pulmonary Disease

## 2015-01-01 ENCOUNTER — Encounter: Payer: Self-pay | Admitting: Pulmonary Disease

## 2015-01-01 VITALS — BP 130/90 | HR 67 | Ht 64.0 in | Wt 306.8 lb

## 2015-01-01 DIAGNOSIS — G4733 Obstructive sleep apnea (adult) (pediatric): Secondary | ICD-10-CM | POA: Diagnosis not present

## 2015-01-01 DIAGNOSIS — Z6841 Body Mass Index (BMI) 40.0 and over, adult: Secondary | ICD-10-CM | POA: Diagnosis not present

## 2015-01-01 NOTE — Progress Notes (Deleted)
   Subjective:    [name], female    DOB: 18-Aug-1960, 54 y.o.   MRN: [id number]  HPI    Review of Systems  Constitutional: Positive for unexpected weight change. Negative for fever.  HENT: Negative for congestion, dental problem, ear pain, nosebleeds, postnasal drip, rhinorrhea, sinus pressure, sneezing, sore throat and trouble swallowing.   Eyes: Negative for redness and itching.  Respiratory: Positive for cough, chest tightness, shortness of breath and wheezing.   Cardiovascular: Positive for leg swelling. Negative for palpitations.  Gastrointestinal: Positive for nausea and vomiting.  Genitourinary: Negative for dysuria.  Musculoskeletal: Positive for joint swelling.  Skin: Negative for rash.  Neurological: Negative for headaches.  Hematological: Does not bruise/bleed easily.  Psychiatric/Behavioral: Negative for dysphoric mood. The patient is not nervous/anxious.        Objective:   Physical Exam        Assessment & Plan:

## 2015-01-01 NOTE — Patient Instructions (Signed)
Will arrange for home sleep study Will call to arrange for follow up after sleep study reviewed  

## 2015-01-01 NOTE — Progress Notes (Signed)
Chief Complaint  Patient presents with  . sleep consult    referred by Dr simpson. pt states shes having trouble breathing at night. shes sleeping on 5 pillows at night, her breathing is getting worse. pt states shes been having trouble breathing for about a year. epworth score 5    History of Present Illness: Angela [name] is a 54 y.o. female for evaluation of sleep problems.  She has been worried about her swelling.  She also has trouble with her sleep.  She snores, and will wake up feeling like she can't breath.  She has also been told that her breathing cuts off at night.  She is tired all the time, and can fall asleep anywhere.  She has to sleep propped up on several pillows.    She goes to sleep at random times, and usually falls asleep on the cough first.  She works as a security officer 3 nights per week, and it is difficult for her to keep a regular schedule.  She falls asleep quickly, but has been using ambien for years .  She wakes up several times to use the bathroom.  She gets out of bed at random times, depending on her work schedule.  She feels tired in the morning, and this persists throughout the day.  She denies morning headache.  She does not use anything to help her stay awake.  She denies sleep walking, sleep talking, bruxism, or nightmares.  There is no history of restless legs.  She denies sleep hallucinations, sleep paralysis, or cataplexy.  The Epworth score is 5 out of 24.  Tests: Echo 10/01/13 >> EF 60 to 65%, grade 1 diastolic dysfx, mild LVH  Angela [name]  has a past medical history of Low back pain; Helicobacter pylori gastritis (2008); Severe obesity (BMI >= 40); GERD (gastroesophageal reflux disease); H/O hiatal hernia; Anxiety; Depression; Asthma; Essential hypertension, benign; Hypercholesterolemia; Degenerative disc disease; Dysrhythmia; Sleep apnea; Migraine; Arthritis; Type 2 diabetes mellitus with diabetic neuropathy; Nephrolithiasis; Hyperthyroidism;  Coronary atherosclerosis of native coronary artery; and Hypertension.  Angela [name]  has past surgical history that includes Cholecystectomy (1996); left knee arthroscopic surgery (1999); Left salphingectomy secondary to ectopic pregnancy (1991); Partial hysterectomy (1991); Knee arthroscopy (10/04/2010, ); Upper gastrointestinal endoscopy (2008 abd pain); Upper gastrointestinal endoscopy (1996 [facility] [place]); Colonoscopy ([facility]); Colonoscopy (2003 [facility], CHANGE IN BOWEL HABITS); Colonoscopy (2006 [facility]); Colonoscopy (2007 [facility]); Esophagogastroduodenoscopy (2008); Back surgery (1993); Back surgery (1994); Back surgery (2002); Back surgery (2011); Cystoscopy/retrograde/ureteroscopy (12/04/2011); Abdominal hysterectomy; Cystoscopy with stent placement (Left, 07/25/2012); Fracture surgery (1999); Holmium laser application (Left, 09/03/2012); Cystoscopy/retrograde/ureteroscopy/stone extraction with basket (Left, 09/03/2012); and rotary cuff  (Right, 05/14/2014).  Prior to Admission medications   Medication Sig Start Date End Date Taking? Authorizing Provider  albuterol (PROAIR HFA) 108 (90 BASE) MCG/ACT inhaler Inhale 2 puffs into the lungs every 6 (six) hours as needed for wheezing or shortness of breath.   Yes Historical Provider, MD  amLODipine (NORVASC) 10 MG tablet Take 1 tablet (10 mg total) by mouth daily. 09/16/14  Yes Margaret E Simpson, MD  atorvastatin (LIPITOR) 80 MG tablet Take 1 tablet (80 mg total) by mouth daily. 09/16/14  Yes Margaret E Simpson, MD  canagliflozin (INVOKANA) 300 MG TABS tablet Take 300 mg by mouth daily before breakfast. 09/16/14  Yes Margaret E Simpson, MD  cloNIDine (CATAPRES) 0.3 MG tablet TAKE ONE TABLET AT 9 PM EVERY NIGHT FOR HIGH BLOOD PRESSURE 11/26/14  Yes Margaret E Simpson, MD  cyclobenzaprine (FLEXERIL) 10   MG tablet TAKE 1 TABLET BY MOUTH 3 TIMES A DAY AS NEEDED FOR MUSCLE SPASMS 10/28/14  Yes Historical Provider, MD  diazepam (VALIUM) 5 MG tablet TAKE 1 TABLET 3 TIMES A  DAY 10/28/14  Yes Margaret E Simpson, MD  ergocalciferol (VITAMIN D2) 50000 UNITS capsule Take 50,000 Units by mouth once a week. Takes on Monday 04/04/12  Yes Margaret E Simpson, MD  esomeprazole (NEXIUM) 40 MG capsule TAKE 1 CAPSULE (40 MG TOTAL) BY MOUTH DAILY. 11/30/14  Yes Margaret E Simpson, MD  FLUoxetine (PROZAC) 40 MG capsule TAKE ONE CAPSULE BY MOUTH EVERY DAY 08/06/14  Yes Margaret E Simpson, MD  furosemide (LASIX) 40 MG tablet TAKE 1 TABLET BY MOUTH TWICE A DAY 08/06/14  Yes Margaret E Simpson, MD  gabapentin (NEURONTIN) 800 MG tablet TAKE 1 TABLET BY MOUTH 4 TIMES A DAY 09/08/14  Yes Margaret E Simpson, MD  KLOR-CON M20 20 MEQ tablet TAKE 1 TABLET (20 MEQ TOTAL) BY MOUTH 2 (TWO) TIMES DAILY. 04/07/14  Yes Margaret E Simpson, MD  metFORMIN (GLUCOPHAGE) 1000 MG tablet Take 1 tablet (1,000 mg total) by mouth 2 (two) times daily with a meal. 09/08/14  Yes Margaret E Simpson, MD  NITROSTAT 0.4 MG SL tablet PLACE 1 TABLET UNDER TONGUE IF NEEDED FOR CHEST PAIN..MAX OF 3 TIMES 09/30/14  Yes Historical Provider, MD  OxyCODONE (OXYCONTIN) 40 mg T12A 12 hr tablet Take 1 tablet (40 mg total) by mouth every 12 (twelve) hours. 12/18/14  Yes Margaret E Simpson, MD  peg 3350 powder (MOVIPREP) 100 G SOLR Take 1 kit (200 g total) by mouth as directed. 09/21/14  Yes Sandi L Fields, MD  polyethylene glycol-electrolytes (TRILYTE) 420 G solution Take 4,000 mLs by mouth as directed. 10/19/14  Yes Sandi L Fields, MD  potassium chloride SA (K-DUR,KLOR-CON) 20 MEQ tablet Take 1 tablet (20 mEq total) by mouth 2 (two) times daily. 12/24/13  Yes Margaret E Simpson, MD  PROAIR HFA 108 (90 BASE) MCG/ACT inhaler INHALE 2 PUFFS BY MOUTH EVERY 6-8HRS AS NEEDED 10/12/14  Yes Margaret E Simpson, MD  promethazine-dextromethorphan (PROMETHAZINE-DM) 6.25-15 MG/5ML syrup One teaspoon at bedtime, as needed, for excessive cough 11/04/14  Yes Margaret E Simpson, MD  spironolactone (ALDACTONE) 100 MG tablet Take 0.5 tablets (50 mg total) by mouth  daily. 02/07/14  Yes Margaret E Simpson, MD  SYMBICORT 160-4.5 MCG/ACT inhaler INHALE 2 PUUFS BY MOUTH TWICE DAILY 10/12/14  Yes Margaret E Simpson, MD  tiZANidine (ZANAFLEX) 4 MG tablet TAKE 1 TABLET (4 MG TOTAL) BY MOUTH 3 (THREE) TIMES DAILY. 12/23/14  Yes Margaret E Simpson, MD  triamterene-hydrochlorothiazide (MAXZIDE) 75-50 MG per tablet TAKE 1 TABLET BY MOUTH DAILY. 11/26/14  Yes Margaret E Simpson, MD  zolpidem (AMBIEN) 10 MG tablet TAKE 1 TABLET BY MOUTH AT BEDTIME 11/16/14  Yes Margaret E Simpson, MD    Allergies  Allergen Reactions  . Morphine Other (See Comments)    Reaction with esophagus, unable to swallow.   . Ace Inhibitors Cough  . Aspirin Other (See Comments)    Reaction with esophagus, unable to swallow.   . Losartan Cough  . Tomato Other (See Comments)    Acid reflux due to acid in tomato  . Adhesive [Tape] Rash    Her family history includes Asthma in her mother; Cancer in her father and mother; Colon cancer in her father; Heart attack in her father; Heart disease in her mother; Hypertension in her mother. There is no history of Colon polyps.  She    reports that she has never smoked. She has never used smokeless tobacco. She reports that she does not drink alcohol or use illicit drugs.  Review of Systems  Constitutional: Positive for unexpected weight change. Negative for fever.  HENT: Negative for congestion, dental problem, ear pain, nosebleeds, postnasal drip, rhinorrhea, sinus pressure, sneezing, sore throat and trouble swallowing.   Eyes: Negative for redness and itching.  Respiratory: Positive for cough, chest tightness, shortness of breath and wheezing.   Cardiovascular: Positive for leg swelling. Negative for palpitations.  Gastrointestinal: Positive for nausea and vomiting.  Genitourinary: Negative for dysuria.  Musculoskeletal: Positive for joint swelling.  Skin: Negative for rash.  Neurological: Negative for headaches.  Hematological: Does not  bruise/bleed easily.  Psychiatric/Behavioral: Negative for dysphoric mood. The patient is not nervous/anxious.    Physical Exam: BP 130/90 mmHg  Pulse 67  Ht 5' 4" (1.626 m)  Wt 306 lb 12.8 oz (139.164 kg)  BMI 52.64 kg/m2  SpO2 97%  General - No distress ENT - No sinus tenderness, no oral exudate, no LAN, no thyromegaly, TM clear, pupils equal/reactive, MP 4 Cardiac - s1s2 regular, no murmur, pulses symmetric Chest - No wheeze/rales/dullness, good air entry, normal respiratory excursion Back - No focal tenderness Abd - Soft, non-tender, no organomegaly, + bowel sounds Ext - No edema Neuro - Normal strength, cranial nerves intact Skin - No rashes Psych - Normal mood, and behavior  Discussion: She has snoring, sleep disruption, witnessed apnea, and daytime sleepiness.  Her BMI is > 35.  She has hx of depression, HTN, CAD, and DM.  I am concerned she could have sleep apnea.  We discussed how sleep apnea can affect various health problems including risks for hypertension, cardiovascular disease, and diabetes.  We also discussed how sleep disruption can increase risks for accident, such as while driving.  Weight loss as a means of improving sleep apnea was also reviewed.  Additional treatment options discussed were CPAP therapy, oral appliance, and surgical intervention.  Assessment/plan:  Obstructive sleep apnea. Plan: - will arrange for home sleep study pending insurance approval  Morbid obesity. Plan: - discussed importance of weight loss   Chesley Mires, M.D. Pager [phone]

## 2015-01-05 ENCOUNTER — Other Ambulatory Visit (HOSPITAL_COMMUNITY): Payer: Self-pay | Admitting: Cardiology

## 2015-01-05 ENCOUNTER — Ambulatory Visit (HOSPITAL_COMMUNITY)
Admission: RE | Admit: 2015-01-05 | Discharge: 2015-01-05 | Disposition: A | Discharge status: Home / Self Care | Payer: Medicare Other | Source: Ambulatory Visit | Attending: Cardiology | Admitting: Cardiology

## 2015-01-05 DIAGNOSIS — I10 Essential (primary) hypertension: Secondary | ICD-10-CM | POA: Insufficient documentation

## 2015-01-05 DIAGNOSIS — R0609 Other forms of dyspnea: Secondary | ICD-10-CM | POA: Diagnosis not present

## 2015-01-05 DIAGNOSIS — E119 Type 2 diabetes mellitus without complications: Secondary | ICD-10-CM | POA: Insufficient documentation

## 2015-01-05 DIAGNOSIS — R0600 Dyspnea, unspecified: Secondary | ICD-10-CM

## 2015-01-05 DIAGNOSIS — E785 Hyperlipidemia, unspecified: Secondary | ICD-10-CM | POA: Diagnosis not present

## 2015-01-05 NOTE — Progress Notes (Signed)
*  PRELIMINARY RESULTS* Echocardiogram 2D Echocardiogram has been performed.  Leavy Cella 01/05/2015, 1:59 PM

## 2015-01-06 ENCOUNTER — Other Ambulatory Visit: Payer: Self-pay | Admitting: Cardiovascular Disease

## 2015-01-11 ENCOUNTER — Other Ambulatory Visit: Payer: Self-pay | Admitting: Pulmonary Disease

## 2015-01-11 DIAGNOSIS — G4730 Sleep apnea, unspecified: Secondary | ICD-10-CM

## 2015-01-13 ENCOUNTER — Ambulatory Visit: Payer: Self-pay | Admitting: Family Medicine

## 2015-01-14 ENCOUNTER — Other Ambulatory Visit: Payer: Self-pay

## 2015-01-14 MED ORDER — OXYCODONE HCL ER 40 MG PO T12A: 40.0000 mg | EXTENDED_RELEASE_TABLET | Freq: Two times a day (BID) | ORAL | Status: DC

## 2015-01-15 ENCOUNTER — Other Ambulatory Visit: Payer: Self-pay | Admitting: Family Medicine

## 2015-01-15 ENCOUNTER — Ambulatory Visit (HOSPITAL_COMMUNITY): Payer: Medicare Other | Attending: Physician Assistant | Admitting: Occupational Therapy

## 2015-01-15 DIAGNOSIS — M629 Disorder of muscle, unspecified: Secondary | ICD-10-CM | POA: Insufficient documentation

## 2015-01-15 DIAGNOSIS — R29898 Other symptoms and signs involving the musculoskeletal system: Secondary | ICD-10-CM | POA: Insufficient documentation

## 2015-01-15 DIAGNOSIS — M7541 Impingement syndrome of right shoulder: Secondary | ICD-10-CM | POA: Insufficient documentation

## 2015-01-15 DIAGNOSIS — M25611 Stiffness of right shoulder, not elsewhere classified: Secondary | ICD-10-CM | POA: Insufficient documentation

## 2015-01-15 DIAGNOSIS — M25511 Pain in right shoulder: Secondary | ICD-10-CM | POA: Insufficient documentation

## 2015-01-18 ENCOUNTER — Encounter: Payer: Self-pay | Admitting: Adult Health

## 2015-01-21 ENCOUNTER — Ambulatory Visit (HOSPITAL_COMMUNITY): Payer: Medicare Other

## 2015-01-21 ENCOUNTER — Ambulatory Visit (INDEPENDENT_AMBULATORY_CARE_PROVIDER_SITE_OTHER): Payer: Medicare Other | Admitting: Adult Health

## 2015-01-21 ENCOUNTER — Encounter: Payer: Self-pay | Admitting: Adult Health

## 2015-01-21 ENCOUNTER — Encounter (HOSPITAL_COMMUNITY): Payer: Self-pay

## 2015-01-21 VITALS — BP 132/80 | HR 96 | Ht 64.0 in | Wt 298.5 lb

## 2015-01-21 DIAGNOSIS — M2041 Other hammer toe(s) (acquired), right foot: Secondary | ICD-10-CM | POA: Diagnosis not present

## 2015-01-21 DIAGNOSIS — M7541 Impingement syndrome of right shoulder: Secondary | ICD-10-CM

## 2015-01-21 DIAGNOSIS — M25611 Stiffness of right shoulder, not elsewhere classified: Secondary | ICD-10-CM | POA: Diagnosis not present

## 2015-01-21 DIAGNOSIS — B351 Tinea unguium: Secondary | ICD-10-CM | POA: Diagnosis not present

## 2015-01-21 DIAGNOSIS — M6289 Other specified disorders of muscle: Secondary | ICD-10-CM

## 2015-01-21 DIAGNOSIS — M2042 Other hammer toe(s) (acquired), left foot: Secondary | ICD-10-CM | POA: Diagnosis not present

## 2015-01-21 DIAGNOSIS — L739 Follicular disorder, unspecified: Secondary | ICD-10-CM

## 2015-01-21 DIAGNOSIS — M629 Disorder of muscle, unspecified: Secondary | ICD-10-CM | POA: Diagnosis not present

## 2015-01-21 DIAGNOSIS — R29898 Other symptoms and signs involving the musculoskeletal system: Secondary | ICD-10-CM | POA: Diagnosis not present

## 2015-01-21 DIAGNOSIS — M25511 Pain in right shoulder: Secondary | ICD-10-CM | POA: Diagnosis not present

## 2015-01-21 DIAGNOSIS — M79671 Pain in right foot: Secondary | ICD-10-CM | POA: Diagnosis not present

## 2015-01-21 HISTORY — DX: Follicular disorder, unspecified: L73.9

## 2015-01-21 MED ORDER — SULFAMETHOXAZOLE-TRIMETHOPRIM 800-160 MG PO TABS: 1.0000 | ORAL_TABLET | Freq: Two times a day (BID) | ORAL | Status: DC

## 2015-01-21 MED ORDER — FLUCONAZOLE 150 MG PO TABS: 150.0000 mg | ORAL_TABLET | Freq: Once | ORAL | Status: DC

## 2015-01-21 NOTE — Therapy (Signed)
[facility], [place], [address] Phone: [phone]   Fax:  [phone]  Occupational Therapy Evaluation  Patient Details  Name: Elita Dame MRN: [id number] Date of Birth: 02-May-1960 Referring Provider:  Gerrit Halls, PA-C / Sydnee Cabal, MD  Encounter Date: 01/21/2015      OT End of Session - 01/21/15 1807    Visit Number 1   Number of Visits 8   Date for OT Re-Evaluation 03/22/15  mini reassess: 02/18/15   Authorization Type Medpay/Medicaid (patient has already received an evaluation this year)   Authorization Time Period before 10th visit   Authorization - Visit Number 1   Authorization - Number of Visits 10   OT Start Time 7062   OT Stop Time 1735   OT Time Calculation (min) 45 min   Activity Tolerance Patient tolerated treatment well   Behavior During Therapy [facility] for tasks assessed/performed      Past Medical History  Diagnosis Date  . Low back pain   . Helicobacter pylori gastritis 2008  . Severe obesity (BMI >= 40)   . GERD (gastroesophageal reflux disease)   . H/O hiatal hernia   . Anxiety   . Depression   . Asthma   . Essential hypertension, benign   . Hypercholesterolemia   . Degenerative disc disease   . Dysrhythmia   . Sleep apnea     STOP BANG SCORE 6no cpap used  . Migraine   . Arthritis   . Type 2 diabetes mellitus with diabetic neuropathy   . Nephrolithiasis     Recurring episodes since 2004  . Hyperthyroidism     s/p radiation  . Coronary atherosclerosis of native coronary artery     Mild nonobstructive CAD by cardiac catheterization 2008 - Dr. Terrence Dupont  . Hypertension   . Folliculitis 3/76/2831    Past Surgical History  Procedure Laterality Date  . Cholecystectomy  1996  . Left knee arthroscopic surgery  1999  . Left salphingectomy secondary to ectopic pregnancy  1991  . Partial hysterectomy  1991  . Knee arthroscopy  10/04/2010,     right knee arthroscopy, dr  Theda Sers  . Upper gastrointestinal endoscopy  2008 abd pain    H. pylori gastritis  . Upper gastrointestinal endoscopy  1996 [facility] [place]    PUD  . Colonoscopy  2000 [facility]    INT HEMORRHOIDS/FISSURE  . Colonoscopy  2003 [facility], CHANGE IN BOWEL HABITS    INT HEMORRHOIDS  . Colonoscopy  2006 [facility]    INT HEMORRHOIDS  . Colonoscopy  2007 [facility]    INT HEMORRHOIDS  . Esophagogastroduodenoscopy  2008    DVV:OHYWV hiatal hernia./Normal esophagus without evidence of Barrett's mass, stricture, erosion or ulceration./Normal duodenal bulb and second portion of the duodenum./Diffuse erythema in the body and the antrum with occasional erosion.  Biopsies obtained via cold forceps to evaluate for H. pylori gastritis  . Back surgery  1993  . Back surgery  1994  . Back surgery  2002  . Back surgery  2011    [facility]   . Cystoscopy/retrograde/ureteroscopy  12/04/2011    Procedure: CYSTOSCOPY/RETROGRADE/URETEROSCOPY;  Surgeon: Molli Hazard, MD;  Location: [facility];  Service: Urology;  Laterality: Right;  . Abdominal hysterectomy    . Cystoscopy with stent placement Left 07/25/2012    Procedure: CYSTOSCOPY, left retograde pyelogram WITH left ureteral  STENT PLACEMENT;  Surgeon: Claybon Jabs, MD;  Location: [facility];  Service: Urology;  Laterality: Left;  .  Fracture surgery  1999    right clavicle  . Holmium laser application Left 05/04/9700    Procedure: HOLMIUM LASER APPLICATION;  Surgeon: Molli Hazard, MD;  Location: [facility];  Service: Urology;  Laterality: Left;  . Cystoscopy/retrograde/ureteroscopy/stone extraction with basket Left 09/03/2012    Procedure: CYSTOSCOPY/RETROGRADE pyelogram/digital URETEROSCOPY/STONE EXTRACTION WITH BASKET, left stent removal;  Surgeon: Molli Hazard, MD;  Location: [facility];  Service: Urology;  Laterality: Left;  . Rotary cuff  Right 05/14/2014    [facility]    There were no vitals filed for this visit.  Visit Diagnosis:  Shoulder impingement syndrome,  right - Plan: Ot plan of care cert/re-cert  Pain in joint, shoulder region, right - Plan: Ot plan of care cert/re-cert  Tight fascia - Plan: Ot plan of care cert/re-cert  Shoulder weakness - Plan: Ot plan of care cert/re-cert  Shoulder joint stiffness, right - Plan: Ot plan of care cert/re-cert      Subjective Assessment - 01/21/15 1758    Subjective  S: I stopped coming because I needed to go back to work and I was so tired of therapy.    Pertinent History Patient is a 54 y/o female S/P right shoulder impingement syndrome returning to therapy. Patient was receiving therapy at this clinic for Right shoulder Rotator cuff repair (05/25/14-08/13/14) in which patient did not complete therapy only attending 13 sessions. Patient is now experiencing increased pain and tenderness as well as decreased strength and ROM. Dr. Theda Sers has referred patient to occupational therapy for evaluation and treatment.    Special Tests FOTO score: 37/100   Patient Stated Goals To be able to reach out.   Currently in Pain? Yes   Pain Score 9    Pain Location Shoulder   Pain Orientation Right   Pain Descriptors / Indicators Constant;Aching   Pain Type Chronic pain   Pain Onset More than a month ago   Pain Frequency Constant           [facility] OT Assessment - 01/21/15 1614    Assessment   Diagnosis right shoulder impingement   Onset Date 05/14/14  surgery for RC   Assessment --  6 weeks   Prior Therapy [facility] last year for [facility] repair.    Precautions   Precautions None   Restrictions   Weight Bearing Restrictions No   Balance Screen   Has the patient fallen in the past 6 months No   Home  Environment   Family/patient expects to be discharged to: Private residence   Prior Function   Level of Independence Independent   Vocation Part time employment   ADL   ADL comments Difficulty reaching above shoulder height, reaching bra strap behind back, reaching out to the side, and lifting  anything heavy.    Mobility   Mobility Status Independent   Written Expression   Dominant Hand Right   Vision - History   Baseline Vision No visual deficits   Cognition   Overall Cognitive Status Within Functional Limits for tasks assessed   ROM / Strength   AROM / PROM / Strength AROM;PROM;Strength   Palpation   Palpation comment Max fascial restrictions and tenderness in right upper arm, trapezius, scapularis and right lateral cervical region.    AROM   Overall AROM Comments Assessed seated. ER/ir adducted.   AROM Assessment Site Shoulder   Right/Left Shoulder Right   Right Shoulder Flexion 115 Degrees   Right Shoulder ABduction 115 Degrees  Right Shoulder Internal Rotation 90 Degrees   Right Shoulder External Rotation 68 Degrees   PROM   Overall PROM  Within functional limits for tasks performed   PROM Assessment Site Shoulder   Right/Left Shoulder Right   Strength   Overall Strength Comments Assessed seated. ir/ER adducted   Strength Assessment Site Shoulder   Right/Left Shoulder Right   Right Shoulder Flexion 3-/5   Right Shoulder ABduction 3-/5   Right Shoulder Internal Rotation 3+/5   Right Shoulder External Rotation 5/5                         OT Education - February 17, 2015 1806    Education provided Yes   Education Details Shoulder stretches. Education regarding use of ice and heat modalities.    Person(s) Educated Patient   Methods Explanation;Demonstration;Handout   Comprehension Returned demonstration;Verbalized understanding          OT Short Term Goals - 2015-02-17 1813    OT SHORT TERM GOAL #1   Title Patient will be educated and independent with HEP   Time 4   Period Weeks   Status New   OT SHORT TERM GOAL #2   Title Patient will increase A/ROM of RUE to WNL to increase ability to complete activities above shoulder height at work with less difficulty.    Time 4   Period Weeks   Status New   OT SHORT TERM GOAL #3   Title Patient will  increase RUE strength to 4/5 to increase ability to heavy items with ease.    Time 4   Period Weeks   Status New   OT SHORT TERM GOAL #4   Title Patient will decrease pain to 5/10 during daily and work related tasks.    Time 4   Period Weeks   Status New   OT SHORT TERM GOAL #5   Title Patient will decrease fascial restrictions from max to min amount.    Time 4   Period Weeks   Status New                  Plan - 02-17-15 1810    Clinical Impression Statement A: Patient is a 54 y/o female S/P right shoulder impingement causing increased pain and fascial restrictions and decreased strength and ROM resulting in difficulty completing daily and work related tasks using RUE as dominant extremity.    Pt will benefit from skilled therapeutic intervention in order to improve on the following deficits (Retired) Decreased strength;Pain;Impaired UE functional use;Increased fascial restricitons;Increased edema;Decreased range of motion   Rehab Potential Excellent   OT Frequency 2x / week   OT Duration 4 weeks   OT Treatment/Interventions Self-care/ADL training;Ultrasound;Iontophoresis;Passive range of motion;Patient/family education;Cryotherapy;Electrical Stimulation;Moist Heat;Therapeutic exercises;Therapeutic activities;Manual Therapy   Plan P: Patient will benefit from skilled OT services to increase functional performance, strength, and ROM while completing daily and work related tasks using RUE. Treatment Plan: myofascial release, muscle energy technique, passive stretching, P/ROM, AA/ROM, A/ROM, scapular strengthening and stability exercises and general strengthening.    Consulted and Agree with Plan of Care Patient          G-Codes - 02-17-2015 1815    Functional Assessment Tool Used FOTO score: 37/100 (63% impaired)   Functional Limitation Carrying, moving and handling objects   Carrying, Moving and Handling Objects Current Status (B1517) At least 60 percent but less than 80  percent impaired, limited or restricted   Carrying, Moving and Handling  Objects Goal Status [phone]) At least 20 percent but less than 40 percent impaired, limited or restricted      Problem List Patient Active Problem List   Diagnosis Date Noted  . Folliculitis 75/30/0511  . Medicare annual wellness visit, subsequent 11/11/2014  . Colon cancer screening 09/21/2014  . Dermatomycosis 05/25/2014  . Cervical neck pain with evidence of disc disease 04/13/2014  . Coronary atherosclerosis of native coronary artery 05/26/2013  . Upper airway cough syndrome 02/18/2013  . Hemorrhoid 04/08/2012  . Vitamin D deficiency 04/04/2012  . Nephrolithiasis 12/10/2011  . Sleep apnea syndrome 12/05/2011  . GERD (gastroesophageal reflux disease) 08/16/2011  . Back pain 11/03/2010  . Hyperlipidemia LDL goal <100 08/11/2008  . Morbid obesity 01/06/2008  . Diabetes mellitus without complication 06/11/1733  . ANXIETY 05/05/2006  . Depression with anxiety 05/05/2006  . Essential hypertension 05/05/2006  . Asthma 05/05/2006    Ailene Ravel, OTR/L,CBIS  [phone]  01/21/2015, 6:18 PM  [facility] [address] [place], [place], [address] Phone: [phone]   Fax:  [phone]

## 2015-01-21 NOTE — Patient Instructions (Signed)
*  Use ice for pain when needed. Ice on for 10 minutes and remove for 10 minutes. Repeat as needed. *Use heat for tightness when needed. Heat on for 10 minutes and remove for 10 minutes. Repeat as needed.  Completed this stretches 2-3 times a day.   Flexibility: Corner Stretch   Standing in corner with hands just above shoulder level and approximately 2 feet  from corner, lean forward until a comfortable stretch is felt across chest. Hold _10___ seconds. Repeat __3__ times per set.    Scapular Retraction (Standing)   With arms at sides, pinch shoulder blades together. Repeat __10__ times per set.  ROM: Towel Stretch - with Interior Rotation   Pull left arm up behind back by pulling towel up with other arm. Hold __10__ seconds. Repeat ___3_ times per set.   http://orth.exer.us/888   Copyright  VHI. All rights reserved.   Posterior Capsule Stretch   Stand or sit, one arm across body so hand rests over opposite shoulder. Gently push on crossed elbow with other hand until stretch is felt in shoulder of crossed arm. Hold _10__ seconds.  Repeat _3__ times per session.    Flexors Stretch, Standing   Stand near wall and slide arm up, with palm facing away from wall, by leaning toward wall. Hold  10___ seconds.  Repeat __3_ times per session.

## 2015-01-21 NOTE — Patient Instructions (Signed)
Folliculitis  Folliculitis is redness, soreness, and swelling (inflammation) of the hair follicles. This condition can occur anywhere on the body. People with weakened immune systems, diabetes, or obesity have a greater risk of getting folliculitis. CAUSES  Bacterial infection. This is the most common cause.  Fungal infection.  Viral infection.  Contact with certain chemicals, especially oils and tars. Long-term folliculitis can result from bacteria that live in the nostrils. The bacteria may trigger multiple outbreaks of folliculitis over time. SYMPTOMS Folliculitis most commonly occurs on the scalp, thighs, legs, back, buttocks, and areas where hair is shaved frequently. An early sign of folliculitis is a small, white or yellow, pus-filled, itchy lesion (pustule). These lesions appear on a red, inflamed follicle. They are usually less than 0.2 inches (5 mm) wide. When there is an infection of the follicle that goes deeper, it becomes a boil or furuncle. A group of closely packed boils creates a larger lesion (carbuncle). Carbuncles tend to occur in hairy, sweaty areas of the body. DIAGNOSIS  Your caregiver can usually tell what is wrong by doing a physical exam. A sample may be taken from one of the lesions and tested in a lab. This can help determine what is causing your folliculitis. TREATMENT  Treatment may include:  Applying warm compresses to the affected areas.  Taking antibiotic medicines orally or applying them to the skin.  Draining the lesions if they contain a large amount of pus or fluid.  Laser hair removal for cases of long-lasting folliculitis. This helps to prevent regrowth of the hair. HOME CARE INSTRUCTIONS  Apply warm compresses to the affected areas as directed by your caregiver.  If antibiotics are prescribed, take them as directed. Finish them even if you start to feel better.  You may take over-the-counter medicines to relieve itching.  Do not shave  irritated skin.  Follow up with your caregiver as directed. SEEK IMMEDIATE MEDICAL CARE IF:   You have increasing redness, swelling, or pain in the affected area.  You have a fever. MAKE SURE YOU:  Understand these instructions.  Will watch your condition.  Will get help right away if you are not doing well or get worse. Document Released: 06/26/2001 Document Revised: 10/17/2011 Document Reviewed: 07/18/2011 [facility] Patient Information 2015 [employer], [place]. This information is not intended to replace advice given to you by your health care Kyzen Horn. Make sure you discuss any questions you have with your health care Kolston Lacount. Wash with antibacterial soap Take septra ds Follow up prn

## 2015-01-21 NOTE — Progress Notes (Signed)
Subjective:     [name], female   DOB: 07/27/1960, 54 y.o.   MRN: [id number]  HPI Angela [name] is a 54 year old black female in complaining of pain on abdomin and numbness.Has had recent back surgery.  Review of Systems Patient denies any headaches, hearing loss, fatigue, blurred vision, shortness of breath, chest pain, problems with bowel movements, urination, or intercourse(not having sex). No joint pain or mood swings.See HPI for positives. Reviewed past medical,surgical, social and family history. Reviewed medications and allergies.     Objective:   Physical Exam BP 132/80 mmHg  Pulse 96  Ht 5\' 4"  (1.626 m)  Wt 298 lb 8 oz (135.399 kg)  BMI 51.21 kg/m2   Skin warm and dry.Pelvic: external genitalia is normal in appearance no lesions, vagina: scant discharge without odor,urethra has no lesions or masses noted, cervix and uterus are absent, adnexa: no masses or tenderness noted. Bladder is non tender and no masses felt. On abdomen has large panniculus and has several areas of folliculitis, she says stomach numb from surgery years ago.She asked for diet pills told her to talk with Dr Moshe Cipro.She says if she takes antibiotic has to have yeast meds.  Assessment:     Folliculitis     Plan:    Review handout on folliculitis  Rx septra ds #28 take 1 bid x 14 days with 1 refill Rx diflucan 150 mg #1 take 1 now and 2 refills and monistat is OTC Follow up prn

## 2015-01-22 ENCOUNTER — Other Ambulatory Visit: Payer: Self-pay | Admitting: Family Medicine

## 2015-01-26 ENCOUNTER — Telehealth: Payer: Self-pay | Admitting: Adult Health

## 2015-01-26 NOTE — Telephone Encounter (Signed)
Spoke with pt. Pt is on an antibiotic for an ingrown hair. She is having pain with urination. Has taken 1 Diflucan. Pt states she don't take antibiotics because they cause UTI's and yeast infections. Please advise. Pt also needs a work note for Sunday and Monday. Thanks!! [place]

## 2015-01-26 NOTE — Telephone Encounter (Signed)
Stopped septra ds says going to bathroom a lot, and antibiotics do not agree with her , has to take 3 diflucan has only taken 1 old her she has refills, want excuse missed work 9/25 and 26.Told her to get urine checked.

## 2015-01-29 ENCOUNTER — Ambulatory Visit (HOSPITAL_COMMUNITY): Payer: Medicare Other | Admitting: Occupational Therapy

## 2015-02-01 ENCOUNTER — Ambulatory Visit (HOSPITAL_COMMUNITY): Payer: Medicare Other | Attending: Physician Assistant

## 2015-02-01 DIAGNOSIS — M629 Disorder of muscle, unspecified: Secondary | ICD-10-CM | POA: Insufficient documentation

## 2015-02-01 DIAGNOSIS — R29898 Other symptoms and signs involving the musculoskeletal system: Secondary | ICD-10-CM | POA: Insufficient documentation

## 2015-02-01 DIAGNOSIS — M25611 Stiffness of right shoulder, not elsewhere classified: Secondary | ICD-10-CM | POA: Insufficient documentation

## 2015-02-01 DIAGNOSIS — M25511 Pain in right shoulder: Secondary | ICD-10-CM | POA: Insufficient documentation

## 2015-02-02 ENCOUNTER — Telehealth: Payer: Self-pay | Admitting: *Deleted

## 2015-02-02 NOTE — Telephone Encounter (Signed)
No xray of knees in this record, BUT may get note stating that because of medical condition (back issues) she is incapable of safely ambulationg continually for more than 30 minutes and a request is placed for job modification to facilitate this. (Do not put in the back issue" in the letter, just let her know the grounds I am using to write the letter  I will sign

## 2015-02-02 NOTE — Telephone Encounter (Signed)
Letter composed and patient aware 

## 2015-02-02 NOTE — Telephone Encounter (Signed)
Please advise 

## 2015-02-02 NOTE — Telephone Encounter (Signed)
Ms Angela [name] called requesting a note for work, Ms Angela [name] states she works [employer] part time and they have her walk a lot and it causes her knee to buckle and pop, Ms Angela [name] stated her work told her if she could get a note from her PCP they can put her on a different post. Ms Angela [name] stated she needs the note today to take to work tonight. Please advise [phone]

## 2015-02-03 ENCOUNTER — Other Ambulatory Visit: Payer: Self-pay

## 2015-02-03 MED ORDER — OXYCODONE HCL ER 40 MG PO T12A: 40.0000 mg | EXTENDED_RELEASE_TABLET | Freq: Two times a day (BID) | ORAL | Status: DC

## 2015-02-04 ENCOUNTER — Ambulatory Visit (HOSPITAL_COMMUNITY): Payer: Medicare Other

## 2015-02-04 ENCOUNTER — Encounter (HOSPITAL_COMMUNITY): Payer: Self-pay

## 2015-02-04 DIAGNOSIS — M25611 Stiffness of right shoulder, not elsewhere classified: Secondary | ICD-10-CM | POA: Diagnosis not present

## 2015-02-04 DIAGNOSIS — M6289 Other specified disorders of muscle: Secondary | ICD-10-CM

## 2015-02-04 DIAGNOSIS — M25511 Pain in right shoulder: Secondary | ICD-10-CM

## 2015-02-04 DIAGNOSIS — M629 Disorder of muscle, unspecified: Secondary | ICD-10-CM

## 2015-02-04 DIAGNOSIS — R29898 Other symptoms and signs involving the musculoskeletal system: Secondary | ICD-10-CM

## 2015-02-04 NOTE — Therapy (Addendum)
[place], [place], [address] Phone: [phone]   Fax:  [phone]  Occupational Therapy Treatment  Patient Details  Name: Angela [name] MRN: [id number] Date of Birth: May 15, 1960 Referring Provider:  Fayrene Helper, MD  Encounter Date: 02/04/2015      OT End of Session - 02/04/15 1656    Visit Number 2   Number of Visits 8   Date for OT Re-Evaluation 03/22/15  mini reassess: 02/18/15   Authorization Type Medpay/Medicaid (patient has already received an evaluation this year)   Authorization Time Period before 10th visit   Authorization - Visit Number 2   Authorization - Number of Visits 10   OT Start Time 1515   OT Stop Time 1600   OT Time Calculation (min) 45 min   Activity Tolerance Patient tolerated treatment well   Behavior During Therapy [facility] for tasks assessed/performed      Past Medical History  Diagnosis Date  . Low back pain   . Helicobacter pylori gastritis 2008  . Severe obesity (BMI >= 40) (HCC)   . GERD (gastroesophageal reflux disease)   . H/O hiatal hernia   . Anxiety   . Depression   . Asthma   . Essential hypertension, benign   . Hypercholesterolemia   . Degenerative disc disease   . Dysrhythmia   . Sleep apnea     STOP BANG SCORE 6no cpap used  . Migraine   . Arthritis   . Type 2 diabetes mellitus with diabetic neuropathy ([place])   . Nephrolithiasis     Recurring episodes since 2004  . Hyperthyroidism     s/p radiation  . Coronary atherosclerosis of native coronary artery     Mild nonobstructive CAD by cardiac catheterization 2008 - Dr. Terrence Dupont  . Hypertension   . Folliculitis 06/20/2540    Past Surgical History  Procedure Laterality Date  . Cholecystectomy  1996  . Left knee arthroscopic surgery  1999  . Left salphingectomy secondary to ectopic pregnancy  1991  . Partial hysterectomy  1991  . Knee arthroscopy  10/04/2010,     right knee arthroscopy, dr Theda Sers  .  Upper gastrointestinal endoscopy  2008 abd pain    H. pylori gastritis  . [place] gastrointestinal endoscopy  [place] [facility]    PUD  . Colonoscopy  2000 [facility]    INT HEMORRHOIDS/FISSURE  . Colonoscopy  2003 [facility], CHANGE IN BOWEL HABITS    INT HEMORRHOIDS  . Colonoscopy  2006 [facility]    INT HEMORRHOIDS  . Colonoscopy  2007 [facility]    INT HEMORRHOIDS  . Esophagogastroduodenoscopy  2008    HCW:CBJSE hiatal hernia./Normal esophagus without evidence of Barrett's mass, stricture, erosion or ulceration./Normal duodenal bulb and second portion of the duodenum./Diffuse erythema in the body and the antrum with occasional erosion.  Biopsies obtained via cold forceps to evaluate for H. pylori gastritis  . Back surgery  1993  . Back surgery  1994  . Back surgery  2002  . Back surgery  2011    [facility]   . Cystoscopy/retrograde/ureteroscopy  12/04/2011    Procedure: CYSTOSCOPY/RETROGRADE/URETEROSCOPY;  Surgeon: Molli Hazard, MD;  Location: [facility];  Service: Urology;  Laterality: Right;  . Abdominal hysterectomy    . Cystoscopy with stent placement Left 07/25/2012    Procedure: CYSTOSCOPY, left retograde pyelogram WITH left ureteral  STENT PLACEMENT;  Surgeon: Claybon Jabs, MD;  Location: [facility];  Service: Urology;  Laterality: Left;  .  Fracture surgery  1999    right clavicle  . Holmium laser application Left 12/04/275    Procedure: HOLMIUM LASER APPLICATION;  Surgeon: Molli Hazard, MD;  [facility];  Service: Urology;  Laterality: Left;  . Cystoscopy/retrograde/ureteroscopy/stone extraction with basket Left 09/03/2012    Procedure: CYSTOSCOPY/RETROGRADE pyelogram/digital URETEROSCOPY/STONE EXTRACTION WITH BASKET, left stent removal;  Surgeon: Molli Hazard, MD;  [facility]: [facility];  Service: Urology;  Laterality: Left;  . Rotary cuff  Right 05/14/2014    [facility]    There were no vitals filed for this visit.  Visit Diagnosis:  Tight fascia  Pain in joint, shoulder  region, right  Shoulder weakness  Shoulder joint stiffness, right      Subjective Assessment - 02/04/15 1539    Subjective  S: I've been doing my exercises at work.    Currently in Pain? Yes   Pain Score 5    Pain Location Shoulder   Pain Orientation Right   Pain Descriptors / Indicators Constant;Aching   Pain Type Chronic pain                      OT Treatments/Exercises (OP) - 02/04/15 1541    Exercises   Exercises Shoulder   Shoulder Exercises: Supine   Protraction PROM;5 reps;AROM;12 reps   Horizontal ABduction PROM;5 reps;AROM;12 reps   External Rotation PROM;5 reps;AROM;12 reps   Internal Rotation PROM;5 reps;AROM;12 reps   Flexion PROM;5 reps;AROM;12 reps   ABduction PROM;5 reps;AROM;12 reps   Shoulder Exercises: Seated   Protraction AROM;12 reps   Horizontal ABduction AROM;12 reps   External Rotation AROM;12 reps   Internal Rotation AROM;12 reps   Flexion AROM;12 reps   Abduction AROM;12 reps   Shoulder Exercises: ROM/Strengthening   Proximal Shoulder Strengthening, Supine 12X no rest breaks   Proximal Shoulder Strengthening, Seated 12X no rest breaks   Manual Therapy   Manual Therapy Myofascial release   Myofascial Release Myofascial release to right upper arm, trapezius, and scapularis region to decrease fascial restrictions and increase joint mobility in pain free zone.                 OT Education - 02/04/15 1658    Education provided Yes   Education Details A/ROM exercises and scapular strengthening with theraband (red). Pt was given print out of OT evaluation and reviewed goals and plan of care.    Person(s) Educated Patient   Methods Explanation;Demonstration;Handout   Comprehension Verbalized understanding;Returned demonstration          OT Short Term Goals - 02/04/15 1658    OT SHORT TERM GOAL #1   Title Patient will be educated and independent with HEP   Status On-going   OT SHORT TERM GOAL #2   Title Patient will  increase A/ROM of RUE to WNL to increase ability to complete activities above shoulder height at work with less difficulty.    Status On-going   OT SHORT TERM GOAL #3   Title Patient will increase RUE strength to 4/5 to increase ability to heavy items with ease.    Status On-going   OT SHORT TERM GOAL #4   Title Patient will decrease pain to 5/10 during daily and work related tasks.    Status On-going   OT SHORT TERM GOAL #5   Title Patient will decrease fascial restrictions from max to min amount.    Status On-going  02/04/15 1656  OT Assessment and Plan  Clinical Impression Statement A: Initiated myofascial release, manual stretching, A/ROM exercises and scapular strengthening with theraband. Pt need min VC for form and technique.   Plan P: Add ball on the wall and UBE bike.      Problem List Patient Active Problem List   Diagnosis Date Noted  . Folliculitis 49/17/9150  . Medicare annual wellness visit, subsequent 11/11/2014  . Colon cancer screening 09/21/2014  . Dermatomycosis 05/25/2014  . Cervical neck pain with evidence of disc disease 04/13/2014  . Coronary atherosclerosis of native coronary artery 05/26/2013  . Upper airway cough syndrome 02/18/2013  . Hemorrhoid 04/08/2012  . Vitamin D deficiency 04/04/2012  . Nephrolithiasis 12/10/2011  . Sleep apnea syndrome 12/05/2011  . GERD (gastroesophageal reflux disease) 08/16/2011  . Back pain 11/03/2010  . Hyperlipidemia LDL goal <100 08/11/2008  . Morbid obesity ([place]) 01/06/2008  . Diabetes mellitus without complication ([place]) 56/97/9480  . ANXIETY 05/05/2006  . Depression with anxiety 05/05/2006  . Essential hypertension 05/05/2006  . Asthma 05/05/2006    Ailene Ravel, OTR/L,CBIS  [phone]  02/04/2015, 5:00 PM  [facility] [address] [place], [place], [address] Phone: [phone]   Fax:  [phone]

## 2015-02-04 NOTE — Patient Instructions (Signed)
Complete exercise 12 times. 2-3 times a day.  1) Shoulder Protraction    Begin with elbows by your side, slowly "punch" straight out in front of you. Repeat ___times, ____set/day.     2) Shoulder Flexion  Supine:     Standing:         Begin with arms at your side with thumbs pointed up, slowly raise both arms up and forward towards overhead. Repeat___times, ___set/day.               3) Horizontal abduction/adduction  Supine:   Standing:           Begin with arms straight out in front of you, bring out to the side in at "T" shape. Keep arms straight entire time. Repeat ____times, ____sets/day.      4) Internal & External Rotation    *No band* -Stand with elbows at the side and elbows bent 90 degrees. Move your forearms away from your body, then bring back inward toward the body.  Repeat ___times, ___sets/day    5) Shoulder Abduction  Supine:     Standing:       Lying on your back begin with your arms flat on the table next to your side. Slowly move your arms out to the side so that they go overhead, in a jumping jack or snow angel movement. Repeat ___times, ___sets/day     (Home) Extension: Isometric / Bilateral Arm Retraction - Sitting   Facing anchor, hold hands and elbow at shoulder height, with elbow bent.  Pull arms back to squeeze shoulder blades together. Repeat 10-15 times.  Copyright  VHI. All rights reserved.   (Home) Retraction: Row - Bilateral (Anchor)   Facing anchor, arms reaching forward, pull hands toward stomach, keeping elbows bent and at your sides and pinching shoulder blades together. Repeat 10-15 times.  Copyright  VHI. All rights reserved.   (Clinic) Extension / Flexion (Assist)   Face anchor, pull arms back, keeping elbow straight, and squeze shoulder blades together. Repeat 10-15 times.   Copyright  VHI. All rights reserved.

## 2015-02-08 ENCOUNTER — Encounter (HOSPITAL_COMMUNITY): Payer: Self-pay

## 2015-02-10 ENCOUNTER — Ambulatory Visit (HOSPITAL_COMMUNITY): Payer: Medicare Other | Admitting: Specialist

## 2015-02-11 ENCOUNTER — Encounter (HOSPITAL_COMMUNITY): Payer: Self-pay

## 2015-02-12 ENCOUNTER — Ambulatory Visit (HOSPITAL_COMMUNITY): Payer: Medicare Other | Admitting: Specialist

## 2015-02-12 ENCOUNTER — Telehealth: Payer: Self-pay

## 2015-02-12 DIAGNOSIS — M629 Disorder of muscle, unspecified: Secondary | ICD-10-CM | POA: Diagnosis not present

## 2015-02-12 DIAGNOSIS — M25611 Stiffness of right shoulder, not elsewhere classified: Secondary | ICD-10-CM | POA: Diagnosis not present

## 2015-02-12 DIAGNOSIS — R29898 Other symptoms and signs involving the musculoskeletal system: Secondary | ICD-10-CM

## 2015-02-12 DIAGNOSIS — M25511 Pain in right shoulder: Secondary | ICD-10-CM | POA: Diagnosis not present

## 2015-02-12 DIAGNOSIS — S80861A Insect bite (nonvenomous), right lower leg, initial encounter: Secondary | ICD-10-CM

## 2015-02-12 DIAGNOSIS — W57XXXA Bitten or stung by nonvenomous insect and other nonvenomous arthropods, initial encounter: Principal | ICD-10-CM

## 2015-02-12 NOTE — Telephone Encounter (Signed)
She has tried Dr. Denna Haggard and he does not accept her insurance.   She would like to stay local if possible.

## 2015-02-12 NOTE — Therapy (Signed)
Angela [name], [place], [address] Phone: [phone]   Fax:  [phone]  Occupational Therapy Treatment  Patient Details  Name: Angela [name] MRN: [id number] Date of Birth: May 16, 1960 No Data Recorded  Encounter Date: 02/12/2015      OT End of Session - 02/12/15 0936    Visit Number 3   Number of Visits 8   Date for OT Re-Evaluation 03/22/15  mini reassess on 02/18/15   Authorization Type Medpay/Medicaid (patient has already received an evaluation this year)   Authorization Time Period before 10th visit   Authorization - Visit Number 3   Authorization - Number of Visits 10   OT Start Time [phone]   OT Stop Time 0935   OT Time Calculation (min) 40 min   Activity Tolerance Patient tolerated treatment well   Behavior During Therapy [facility] for tasks assessed/performed      Past Medical History  Diagnosis Date  . Low back pain   . Helicobacter pylori gastritis 2008  . Severe obesity (BMI >= 40) (HCC)   . GERD (gastroesophageal reflux disease)   . H/O hiatal hernia   . Anxiety   . Depression   . Asthma   . Essential hypertension, benign   . Hypercholesterolemia   . Degenerative disc disease   . Dysrhythmia   . Sleep apnea     STOP BANG SCORE 6no cpap used  . Migraine   . Arthritis   . Type 2 diabetes mellitus with diabetic neuropathy ([place])   . Nephrolithiasis     Recurring episodes since 2004  . Hyperthyroidism     s/p radiation  . Coronary atherosclerosis of native coronary artery     Mild nonobstructive CAD by cardiac catheterization 2008 - Dr. Terrence Dupont  . Hypertension   . Folliculitis 8/67/6195    Past Surgical History  Procedure Laterality Date  . Cholecystectomy  1996  . Left knee arthroscopic surgery  1999  . Left salphingectomy secondary to ectopic pregnancy  1991  . Partial hysterectomy  1991  . Knee arthroscopy  10/04/2010,     right knee arthroscopy, dr Theda Sers  . Upper gastrointestinal  endoscopy  2008 abd pain    H. pylori gastritis  . Upper gastrointestinal endoscopy  1996 [facility]    PUD  . Colonoscopy  2000 [facility]    INT HEMORRHOIDS/FISSURE  . Colonoscopy  2003 [facility], CHANGE IN BOWEL HABITS    INT HEMORRHOIDS  . Colonoscopy  2006 [facility]    INT HEMORRHOIDS  . Colonoscopy  2007 [facility]    INT HEMORRHOIDS  . Esophagogastroduodenoscopy  2008    KDT:OIZTI hiatal hernia./Normal esophagus without evidence of Barrett's mass, stricture, erosion or ulceration./Normal duodenal bulb and second portion of the duodenum./Diffuse erythema in the body and the antrum with occasional erosion.  Biopsies obtained via cold forceps to evaluate for H. pylori gastritis  . Back surgery  1993  . Back surgery  1994  . Back surgery  2002  . Back surgery  2011    [facility]   . Cystoscopy/retrograde/ureteroscopy  12/04/2011    Procedure: CYSTOSCOPY/RETROGRADE/URETEROSCOPY;  Surgeon: Molli Hazard, MD;  [facility];  Service: Urology;  Laterality: Right;  . Abdominal hysterectomy    . Cystoscopy with stent placement Left 07/25/2012    Procedure: CYSTOSCOPY, left retograde pyelogram WITH left ureteral  STENT PLACEMENT;  Surgeon: Claybon Jabs, MD;  [facility];  Service: Urology;  Laterality: Left;  . Fracture surgery  1999    right clavicle  . Holmium laser application Left 06/30/9516    Procedure: HOLMIUM LASER APPLICATION;  Surgeon: Molli Hazard, MD;  [facility];  Service: Urology;  Laterality: Left;  . Cystoscopy/retrograde/ureteroscopy/stone extraction with basket Left 09/03/2012    Procedure: CYSTOSCOPY/RETROGRADE pyelogram/digital URETEROSCOPY/STONE EXTRACTION WITH BASKET, left stent removal;  Surgeon: Molli Hazard, MD;  [facility];  Service: Urology;  Laterality: Left;  . Rotary cuff  Right 05/14/2014    [facility]    There were no vitals filed for this visit.  Visit Diagnosis:  Pain in joint, shoulder region, right  Shoulder weakness       Subjective Assessment - 02/12/15 0857    Subjective  S:  Angela [name] been doing my exercises at work.    Currently in Pain? Yes   Pain Score 4    Pain Location Shoulder   Pain Orientation Right   Pain Descriptors / Indicators Constant;Aching            [facility] OT Assessment - 02/12/15 0001    Assessment   Diagnosis right shoulder impingement   Onset Date 05/14/14   Precautions   Precautions None                  OT Treatments/Exercises (OP) - 02/12/15 0001    Exercises   Exercises Shoulder   Shoulder Exercises: Supine   Protraction PROM;5 reps;Strengthening;15 reps   Protraction Weight (lbs) 1   Horizontal ABduction PROM;5 reps;Strengthening;10 reps   Horizontal ABduction Weight (lbs) 1   External Rotation PROM;5 reps;Strengthening;10 reps   External Rotation Weight (lbs) 1   Internal Rotation PROM;5 reps;Strengthening;15 reps   Internal Rotation Weight (lbs) 1   Flexion PROM;5 reps;Strengthening;15 reps   Shoulder Flexion Weight (lbs) 1   ABduction PROM;5 reps;Strengthening;15 reps   Shoulder ABduction Weight (lbs) 1   Shoulder Exercises: Seated   Protraction AROM;15 reps   Horizontal ABduction AROM;15 reps   External Rotation AROM;15 reps   Internal Rotation AROM;15 reps   Flexion AROM   Abduction AROM;15 reps   Shoulder Exercises: ROM/Strengthening   Wall Wash 2'   Proximal Shoulder Strengthening, Supine 10X with 1# no rest breaks   Proximal Shoulder Strengthening, Seated 12X no rest break   Ball on Wall 1' with arm flexed to 90 degrees and 1' with arm abductedto 90   Manual Therapy   Manual Therapy Myofascial release   Myofascial Release Myofascial release to right upper arm, trapezius, and scapularis region to decrease fascial restrictions and increase joint mobility in pain free zone.                   OT Short Term Goals - 02/04/15 1658    OT SHORT TERM GOAL #1   Title Patient will be educated and independent with HEP   Status On-going    OT SHORT TERM GOAL #2   Title Patient will increase A/ROM of RUE to WNL to increase ability to complete activities above shoulder height at work with less difficulty.    Status On-going   OT SHORT TERM GOAL #3   Title Patient will increase RUE strength to 4/5 to increase ability to heavy items with ease.    Status On-going   OT SHORT TERM GOAL #4   Title Patient will decrease pain to 5/10 during daily and work related tasks.    Status On-going   OT SHORT TERM GOAL #5   Title Patient will decrease fascial restrictions  from max to min amount.    Status On-going                  Plan - 02/12/15 3291    Clinical Impression Statement A:  Patient had full P/ROM with minimal restrictions this date.  Added 1# to supine exercises.  Initiiated ball on wall and wall wash this date.     Plan P:  Add UBE and attempt strengthening in seated.         Problem List Patient Active Problem List   Diagnosis Date Noted  . Folliculitis 91/66/0600  . Medicare annual wellness visit, subsequent 11/11/2014  . Colon cancer screening 09/21/2014  . Dermatomycosis 05/25/2014  . Cervical neck pain with evidence of disc disease 04/13/2014  . Coronary atherosclerosis of native coronary artery 05/26/2013  . Upper airway cough syndrome 02/18/2013  . Hemorrhoid 04/08/2012  . Vitamin D deficiency 04/04/2012  . Nephrolithiasis 12/10/2011  . Sleep apnea syndrome 12/05/2011  . GERD (gastroesophageal reflux disease) 08/16/2011  . Back pain 11/03/2010  . Hyperlipidemia LDL goal <100 08/11/2008  . Morbid obesity ([place]) 01/06/2008  . Diabetes mellitus without complication ([place]) 45/99/7741  . ANXIETY 05/05/2006  . Depression with anxiety 05/05/2006  . Essential hypertension 05/05/2006  . Asthma 05/05/2006    Vangie Bicker, OTR/L [phone]  02/12/2015, 9:39 AM  [facility] [address] [place], [place], [address] Phone: [phone]   Fax:   [phone]  Name: Verniece Encarnacion MRN: [id number] Date of Birth: 05-06-1960

## 2015-02-15 ENCOUNTER — Encounter (HOSPITAL_COMMUNITY): Payer: Self-pay | Admitting: Specialist

## 2015-02-15 DIAGNOSIS — M1712 Unilateral primary osteoarthritis, left knee: Secondary | ICD-10-CM | POA: Diagnosis not present

## 2015-02-15 DIAGNOSIS — S83282D Other tear of lateral meniscus, current injury, left knee, subsequent encounter: Secondary | ICD-10-CM | POA: Diagnosis not present

## 2015-02-15 DIAGNOSIS — M170 Bilateral primary osteoarthritis of knee: Secondary | ICD-10-CM | POA: Diagnosis not present

## 2015-02-15 DIAGNOSIS — M1711 Unilateral primary osteoarthritis, right knee: Secondary | ICD-10-CM | POA: Diagnosis not present

## 2015-02-15 DIAGNOSIS — S83242D Other tear of medial meniscus, current injury, left knee, subsequent encounter: Secondary | ICD-10-CM | POA: Diagnosis not present

## 2015-02-16 ENCOUNTER — Ambulatory Visit (HOSPITAL_COMMUNITY): Payer: Medicare Other

## 2015-02-17 ENCOUNTER — Telehealth (HOSPITAL_COMMUNITY): Payer: Self-pay

## 2015-02-17 NOTE — Telephone Encounter (Signed)
Date: 02/17/15  Called patient and left message regarding missed therapy appt on 02/16/15. Reminded patient of next appt scheduled for 02/18/15 and if she is unable to make it to call and cancel. Also reminded patient that she is only able to keep one appt at a time due to facilities attendance policy.   Ailene Ravel, OTR/L,CBIS  [phone]

## 2015-02-18 ENCOUNTER — Encounter (HOSPITAL_COMMUNITY): Payer: Self-pay

## 2015-02-18 ENCOUNTER — Ambulatory Visit (HOSPITAL_COMMUNITY): Payer: Medicare Other

## 2015-02-19 ENCOUNTER — Telehealth (HOSPITAL_COMMUNITY): Payer: Self-pay

## 2015-02-19 ENCOUNTER — Encounter (HOSPITAL_COMMUNITY): Payer: Self-pay

## 2015-02-19 NOTE — Therapy (Signed)
[facility] [address] [place], [place], [address] Phone: [phone]   Fax:  [phone]  Patient Details  Name: Angela [name] MRN: [id number] Date of Birth: 09/16/1960 Referring Provider:  No ref. provider found  Encounter Date: 02/19/2015 OCCUPATIONAL THERAPY DISCHARGE SUMMARY  Visits from Start of Care: 3  Current functional level related to goals / functional outcomes: OT SHORT TERM GOAL #1    Title Patient will be educated and independent with HEP   Status On-going   OT SHORT TERM GOAL #2   Title Patient will increase A/ROM of RUE to WNL to increase ability to complete activities above shoulder height at work with less difficulty.    Status On-going   OT SHORT TERM GOAL #3   Title Patient will increase RUE strength to 4/5 to increase ability to heavy items with ease.    Status On-going   OT SHORT TERM GOAL #4   Title Patient will decrease pain to 5/10 during daily and work related tasks.    Status On-going   OT SHORT TERM GOAL #5   Title Patient will decrease fascial restrictions from max to min amount.    Status On-going           Remaining deficits: Patient has had 3 no shows and will be discharged from OT per facility's attendance policy. Patient was informed that if she wishes to continue therapy she will need no MD order.     Plan: Patient agrees to discharge.  Patient goals were not met. Patient is being discharged due to                                                     ?????3 no shows.       Ailene Ravel, OTR/L,CBIS  [phone]  02/19/2015, 11:55 AM  [facility] [address] [place], [place], [address] Phone: [phone]   Fax:  [phone]

## 2015-02-19 NOTE — Telephone Encounter (Signed)
Date: 02/19/15  Spoke with patient regarding 3 no shows. Patient will be discharged per facility's attendance policy. Patient was informed that she will need a new MD order if she feels she needs to continue therapy. Patient verbalized understanding.   Ailene Ravel, OTR/L,CBIS  [phone]

## 2015-02-22 ENCOUNTER — Encounter (HOSPITAL_COMMUNITY): Payer: Self-pay

## 2015-02-23 ENCOUNTER — Encounter (HOSPITAL_COMMUNITY): Payer: Self-pay

## 2015-02-25 ENCOUNTER — Encounter (HOSPITAL_COMMUNITY): Payer: Self-pay

## 2015-02-26 ENCOUNTER — Encounter (HOSPITAL_COMMUNITY): Payer: Self-pay | Admitting: *Deleted

## 2015-02-26 ENCOUNTER — Emergency Department (HOSPITAL_COMMUNITY)
Admission: EM | Admit: 2015-02-26 | Discharge: 2015-02-26 | Disposition: A | Discharge status: Home / Self Care | Payer: Medicare Other | Attending: Emergency Medicine | Admitting: Emergency Medicine

## 2015-02-26 DIAGNOSIS — K219 Gastro-esophageal reflux disease without esophagitis: Secondary | ICD-10-CM | POA: Diagnosis not present

## 2015-02-26 DIAGNOSIS — Z87442 Personal history of urinary calculi: Secondary | ICD-10-CM | POA: Insufficient documentation

## 2015-02-26 DIAGNOSIS — Z792 Long term (current) use of antibiotics: Secondary | ICD-10-CM | POA: Insufficient documentation

## 2015-02-26 DIAGNOSIS — Z8619 Personal history of other infectious and parasitic diseases: Secondary | ICD-10-CM | POA: Diagnosis not present

## 2015-02-26 DIAGNOSIS — E7800 Pure hypercholesterolemia, unspecified: Secondary | ICD-10-CM | POA: Diagnosis not present

## 2015-02-26 DIAGNOSIS — L03115 Cellulitis of right lower limb: Secondary | ICD-10-CM | POA: Diagnosis not present

## 2015-02-26 DIAGNOSIS — I2510 Atherosclerotic heart disease of native coronary artery without angina pectoris: Secondary | ICD-10-CM | POA: Insufficient documentation

## 2015-02-26 DIAGNOSIS — F419 Anxiety disorder, unspecified: Secondary | ICD-10-CM | POA: Insufficient documentation

## 2015-02-26 DIAGNOSIS — G43909 Migraine, unspecified, not intractable, without status migrainosus: Secondary | ICD-10-CM | POA: Insufficient documentation

## 2015-02-26 DIAGNOSIS — Z79899 Other long term (current) drug therapy: Secondary | ICD-10-CM | POA: Insufficient documentation

## 2015-02-26 DIAGNOSIS — R21 Rash and other nonspecific skin eruption: Secondary | ICD-10-CM | POA: Diagnosis present

## 2015-02-26 DIAGNOSIS — E1140 Type 2 diabetes mellitus with diabetic neuropathy, unspecified: Secondary | ICD-10-CM | POA: Insufficient documentation

## 2015-02-26 DIAGNOSIS — Z9889 Other specified postprocedural states: Secondary | ICD-10-CM | POA: Diagnosis not present

## 2015-02-26 DIAGNOSIS — J45909 Unspecified asthma, uncomplicated: Secondary | ICD-10-CM | POA: Diagnosis not present

## 2015-02-26 DIAGNOSIS — F329 Major depressive disorder, single episode, unspecified: Secondary | ICD-10-CM | POA: Diagnosis not present

## 2015-02-26 DIAGNOSIS — I10 Essential (primary) hypertension: Secondary | ICD-10-CM | POA: Diagnosis not present

## 2015-02-26 MED ORDER — FLUCONAZOLE 200 MG PO TABS: ORAL_TABLET | ORAL | Status: DC

## 2015-02-26 MED ORDER — DOXYCYCLINE HYCLATE 100 MG PO CAPS: 100.0000 mg | ORAL_CAPSULE | Freq: Two times a day (BID) | ORAL | Status: DC

## 2015-02-26 MED ORDER — TRIAMCINOLONE ACETONIDE 0.1 % EX CREA: 1.0000 "application " | TOPICAL_CREAM | Freq: Two times a day (BID) | CUTANEOUS | Status: DC

## 2015-02-26 NOTE — ED Notes (Signed)
Pt states she was bitten by an insect in May, states trouble to the area since. Never has been on antibiotics since, per pt. Pt states redness and warmth to the area has gotten worse over the past 2-3 weeks. Swelling to lower extremity also since August, per pt.

## 2015-02-26 NOTE — Discharge Instructions (Signed)

## 2015-02-26 NOTE — ED Provider Notes (Signed)
CSN: [id number]     Arrival date & time 02/26/15  1423 History   First MD Initiated Contact with Patient 02/26/15 1553     Chief Complaint  Patient presents with  . Leg Pain      HPI  Patient presents for evaluation of an area of skin discoloration and itching of her right lower leg. States she was bitten by something on her leg and April or May. She is a she's had some discoloration of her skin since that time it is slowly expanded. Is not itching and become more red and painful over last 4-5 days and presents here.  Past Medical History  Diagnosis Date  . Low back pain   . Helicobacter pylori gastritis 2008  . Severe obesity (BMI >= 40) (HCC)   . GERD (gastroesophageal reflux disease)   . H/O hiatal hernia   . Anxiety   . Depression   . Asthma   . Essential hypertension, benign   . Hypercholesterolemia   . Degenerative disc disease   . Dysrhythmia   . Sleep apnea     STOP BANG SCORE 6no cpap used  . Migraine   . Arthritis   . Type 2 diabetes mellitus with diabetic neuropathy ([place])   . Nephrolithiasis     Recurring episodes since 2004  . Hyperthyroidism     s/p radiation  . Coronary atherosclerosis of native coronary artery     Mild nonobstructive CAD by cardiac catheterization 2008 - Dr. Terrence Dupont  . Hypertension   . Folliculitis 12/17/2991   Past Surgical History  Procedure Laterality Date  . Cholecystectomy  1996  . Left knee arthroscopic surgery  1999  . Left salphingectomy secondary to ectopic pregnancy  1991  . Partial hysterectomy  1991  . Knee arthroscopy  10/04/2010,     right knee arthroscopy, dr Theda Sers  . Upper gastrointestinal endoscopy  2008 abd pain    H. pylori gastritis  . Upper gastrointestinal endoscopy  1996 [facility] [place]    PUD  . Colonoscopy  2000 [facility]    INT HEMORRHOIDS/FISSURE  . Colonoscopy  2003 [facility], CHANGE IN BOWEL HABITS    INT HEMORRHOIDS  . Colonoscopy  2006 [facility]    INT HEMORRHOIDS  . Colonoscopy  2007 [facility]    INT HEMORRHOIDS   . Esophagogastroduodenoscopy  2008    [facility]:RCVEL hiatal hernia./Normal esophagus without evidence of Barrett's mass, stricture, erosion or ulceration./Normal duodenal bulb and second portion of the duodenum./Diffuse erythema in the body and the antrum with occasional erosion.  Biopsies obtained via cold forceps to evaluate for H. pylori gastritis  . Back surgery  1993  . Back surgery  1994  . Back surgery  2002  . Back surgery  2011    [facility]   . Cystoscopy/retrograde/ureteroscopy  12/04/2011    Procedure: CYSTOSCOPY/RETROGRADE/URETEROSCOPY;  Surgeon: Molli Hazard, MD;  Location: [facility];  Service: Urology;  Laterality: Right;  . Abdominal hysterectomy    . Cystoscopy with stent placement Left 07/25/2012    Procedure: CYSTOSCOPY, left retograde pyelogram WITH left ureteral  STENT PLACEMENT;  Surgeon: Claybon Jabs, MD;  Location: [facility];  Service: Urology;  Laterality: Left;  . Fracture surgery  1999    right clavicle  . Holmium laser application Left 07/07/1015    Procedure: HOLMIUM LASER APPLICATION;  Surgeon: Molli Hazard, MD;  Location: [facility];  Service: Urology;  Laterality: Left;  . Cystoscopy/retrograde/ureteroscopy/stone extraction with basket Left 09/03/2012    Procedure: CYSTOSCOPY/RETROGRADE pyelogram/digital  URETEROSCOPY/STONE EXTRACTION WITH BASKET, left stent removal;  Surgeon: Molli Hazard, MD;  Location: [facility];  Service: Urology;  Laterality: Left;  . Rotary cuff  Right 05/14/2014    [facility]   Family History  Problem Relation Age of Onset  . Heart disease Mother   . Hypertension Mother   . Cancer Mother     Cervical   . Asthma Mother   . Heart attack Father   . Cancer Father     Prostate  . Colon cancer Father     DECEASED AGE 45  . Colon polyps Neg Hx    Social History  Substance Use Topics  . Smoking status: Never Smoker   . Smokeless tobacco: Never Used  . Alcohol Use: No   OB History    Gravida Para Term Preterm AB TAB SAB  Ectopic Multiple Living   3 1   1   1  1      Review of Systems  Constitutional: Negative for fever, chills, diaphoresis, appetite change and fatigue.  HENT: Negative for mouth sores, sore throat and trouble swallowing.   Eyes: Negative for visual disturbance.  Respiratory: Negative for cough, chest tightness, shortness of breath and wheezing.   Cardiovascular: Negative for chest pain.  Gastrointestinal: Negative for nausea, vomiting, abdominal pain, diarrhea and abdominal distention.  Endocrine: Negative for polydipsia, polyphagia and polyuria.  Genitourinary: Negative for dysuria, frequency and hematuria.  Musculoskeletal: Negative for gait problem.  Skin: Positive for color change and rash. Negative for pallor.  Neurological: Negative for dizziness, syncope, light-headedness and headaches.  Hematological: Does not bruise/bleed easily.  Psychiatric/Behavioral: Negative for behavioral problems and confusion.      Allergies  Morphine; Ace inhibitors; Aspirin; Losartan; Tomato; and Adhesive  Home Medications   Prior to Admission medications   Medication Sig Start Date End Date Taking? Authorizing Provider  albuterol (PROAIR HFA) 108 (90 BASE) MCG/ACT inhaler Inhale 2 puffs into the lungs every 6 (six) hours as needed for wheezing or shortness of breath.    Historical Provider, MD  amLODipine (NORVASC) 10 MG tablet Take 1 tablet (10 mg total) by mouth daily. 09/16/14   Fayrene Helper, MD  atorvastatin (LIPITOR) 80 MG tablet Take 1 tablet (80 mg total) by mouth daily. 09/16/14   Fayrene Helper, MD  canagliflozin [facility]) 300 MG TABS tablet Take 300 mg by mouth daily before breakfast. 09/16/14   Fayrene Helper, MD  cloNIDine (CATAPRES) 0.3 MG tablet TAKE ONE TABLET AT 9 PM EVERY NIGHT FOR HIGH BLOOD PRESSURE 11/26/14   Fayrene Helper, MD  cyclobenzaprine (FLEXERIL) 10 MG tablet TAKE 1 TABLET BY MOUTH 3 TIMES A DAY AS NEEDED FOR MUSCLE SPASMS 10/28/14   Historical Provider,  MD  diazepam (VALIUM) 5 MG tablet TAKE 1 TABLET BY MOUTH 3 TIMES A DAY 01/25/15   Fayrene Helper, MD  doxycycline (VIBRAMYCIN) 100 MG capsule Take 1 capsule (100 mg total) by mouth 2 (two) times daily. 02/26/15   Tanna Furry, MD  ergocalciferol (VITAMIN D2) 50000 UNITS capsule Take 50,000 Units by mouth once a week. Takes on Monday 04/04/12   Fayrene Helper, MD  esomeprazole (NEXIUM) 40 MG capsule TAKE 1 CAPSULE (40 MG TOTAL) BY MOUTH DAILY. 11/30/14   Fayrene Helper, MD  fluconazole (DIFLUCAN) 200 MG tablet 1 on last day of antibiotics. Repeat in 7 days as needed for vaginal yeast infection. 02/26/15   Tanna Furry, MD  FLUoxetine (PROZAC) 40 MG capsule TAKE ONE CAPSULE  BY MOUTH EVERY DAY 08/06/14   Fayrene Helper, MD  furosemide (LASIX) 40 MG tablet TAKE 1 TABLET BY MOUTH TWICE A DAY 08/06/14   Fayrene Helper, MD  gabapentin (NEURONTIN) 800 MG tablet TAKE 1 TABLET BY MOUTH 4 TIMES A DAY 09/08/14   Fayrene Helper, MD  metFORMIN (GLUCOPHAGE) 1000 MG tablet TAKE 1 TABLET (1,000 MG TOTAL) BY MOUTH 2 (TWO) TIMES DAILY WITH A MEAL. 01/15/15   Fayrene Helper, MD  NITROSTAT 0.4 MG SL tablet PLACE 1 TABLET UNDER TONGUE IF NEEDED FOR CHEST PAIN.Bennie Pierini OF 3 TIMES 09/30/14   Historical Provider, MD  OxyCODONE (OXYCONTIN) 40 mg T12A 12 hr tablet Take 1 tablet (40 mg total) by mouth every 12 (twelve) hours. 02/03/15   Fayrene Helper, MD  potassium chloride SA (K-DUR,KLOR-CON) 20 MEQ tablet Take 1 tablet (20 mEq total) by mouth 2 (two) times daily. 12/24/13   Fayrene Helper, MD  PROAIR HFA 108 [phone] BASE) MCG/ACT inhaler INHALE 2 PUFFS BY MOUTH EVERY 6-8HRS AS NEEDED 10/12/14   Fayrene Helper, MD  sulfamethoxazole-trimethoprim (BACTRIM DS,SEPTRA DS) 800-160 MG per tablet Take 1 tablet by mouth 2 (two) times daily. 01/21/15   Estill Dooms, NP  triamcinolone cream (KENALOG) 0.1 % Apply 1 application topically 2 (two) times daily. 02/26/15   Tanna Furry, MD  zolpidem (AMBIEN) 10 MG tablet TAKE  1 TABLET BY MOUTH AT BEDTIME 01/15/15   Fayrene Helper, MD   BP 157/98 mmHg  Pulse 76  Temp(Src) 98.1 F (36.7 C) (Oral)  Resp 18  Ht 5\' 4"  (1.626 m)  Wt 298 lb (135.172 kg)  BMI 51.13 kg/m2  SpO2 100% Physical Exam  Constitutional: She is oriented to person, place, and time. She appears well-developed and well-nourished. No distress.  HENT:  Head: Normocephalic.  Eyes: Conjunctivae are normal. Pupils are equal, round, and reactive to light. No scleral icterus.  Neck: Normal range of motion. Neck supple. No thyromegaly present.  Cardiovascular: Normal rate and regular rhythm.  Exam reveals no gallop and no friction rub.   No murmur heard. Pulmonary/Chest: Effort normal and breath sounds normal. No respiratory distress. She has no wheezes. She has no rales.  Abdominal: Soft. Bowel sounds are normal. She exhibits no distension. There is no tenderness. There is no rebound.  Musculoskeletal: Normal range of motion.  Neurological: She is alert and oriented to person, place, and time.  Skin: Skin is warm and dry. No rash noted.     Psychiatric: She has a normal mood and affect. Her behavior is normal.    ED Course  Procedures (including critical care time) Labs Review Labs Reviewed - No data to display  Imaging Review No results found. I have personally reviewed and evaluated these images and lab results as part of my medical decision-making.   EKG Interpretation None      MDM   Final diagnoses:  Cellulitis of right lower extremity    Given prescription for doxycycline, and cream. States she gets vaginal [place] infections. Therefore given Diflucan. Encouraged follow up with her primary care physician.    Tanna Furry, MD 02/26/15 [phone]

## 2015-02-27 ENCOUNTER — Other Ambulatory Visit: Payer: Self-pay | Admitting: Family Medicine

## 2015-03-01 ENCOUNTER — Encounter (HOSPITAL_COMMUNITY): Payer: Self-pay

## 2015-03-04 ENCOUNTER — Encounter (HOSPITAL_COMMUNITY): Payer: Self-pay

## 2015-03-08 DIAGNOSIS — M1711 Unilateral primary osteoarthritis, right knee: Secondary | ICD-10-CM | POA: Diagnosis not present

## 2015-03-08 DIAGNOSIS — M1712 Unilateral primary osteoarthritis, left knee: Secondary | ICD-10-CM | POA: Diagnosis not present

## 2015-03-08 DIAGNOSIS — M170 Bilateral primary osteoarthritis of knee: Secondary | ICD-10-CM | POA: Diagnosis not present

## 2015-03-08 DIAGNOSIS — M7541 Impingement syndrome of right shoulder: Secondary | ICD-10-CM | POA: Diagnosis not present

## 2015-03-12 ENCOUNTER — Other Ambulatory Visit: Payer: Self-pay

## 2015-03-12 MED ORDER — OXYCODONE HCL ER 40 MG PO T12A: 40.0000 mg | EXTENDED_RELEASE_TABLET | Freq: Two times a day (BID) | ORAL | Status: DC

## 2015-03-15 DIAGNOSIS — M170 Bilateral primary osteoarthritis of knee: Secondary | ICD-10-CM | POA: Diagnosis not present

## 2015-03-15 DIAGNOSIS — M1712 Unilateral primary osteoarthritis, left knee: Secondary | ICD-10-CM | POA: Diagnosis not present

## 2015-03-15 DIAGNOSIS — M1711 Unilateral primary osteoarthritis, right knee: Secondary | ICD-10-CM | POA: Diagnosis not present

## 2015-03-20 ENCOUNTER — Other Ambulatory Visit: Payer: Self-pay | Admitting: Family Medicine

## 2015-03-22 DIAGNOSIS — M1711 Unilateral primary osteoarthritis, right knee: Secondary | ICD-10-CM | POA: Diagnosis not present

## 2015-03-22 DIAGNOSIS — M170 Bilateral primary osteoarthritis of knee: Secondary | ICD-10-CM | POA: Diagnosis not present

## 2015-03-22 DIAGNOSIS — M1712 Unilateral primary osteoarthritis, left knee: Secondary | ICD-10-CM | POA: Diagnosis not present

## 2015-03-24 ENCOUNTER — Encounter (HOSPITAL_BASED_OUTPATIENT_CLINIC_OR_DEPARTMENT_OTHER): Payer: Self-pay

## 2015-03-29 ENCOUNTER — Encounter: Payer: Self-pay | Admitting: Family Medicine

## 2015-03-29 ENCOUNTER — Ambulatory Visit (HOSPITAL_COMMUNITY)
Admission: RE | Admit: 2015-03-29 | Discharge: 2015-03-29 | Disposition: A | Discharge status: Home / Self Care | Payer: Medicare Other | Source: Ambulatory Visit | Attending: Family Medicine | Admitting: Family Medicine

## 2015-03-29 ENCOUNTER — Ambulatory Visit (INDEPENDENT_AMBULATORY_CARE_PROVIDER_SITE_OTHER): Payer: Medicare Other | Admitting: Family Medicine

## 2015-03-29 ENCOUNTER — Other Ambulatory Visit: Payer: Self-pay | Admitting: Family Medicine

## 2015-03-29 VITALS — BP 160/96 | HR 76 | Resp 18 | Ht 64.0 in | Wt 296.0 lb

## 2015-03-29 DIAGNOSIS — M7989 Other specified soft tissue disorders: Secondary | ICD-10-CM

## 2015-03-29 DIAGNOSIS — M79604 Pain in right leg: Secondary | ICD-10-CM | POA: Diagnosis not present

## 2015-03-29 DIAGNOSIS — M25561 Pain in right knee: Secondary | ICD-10-CM

## 2015-03-29 DIAGNOSIS — Z114 Encounter for screening for human immunodeficiency virus [HIV]: Secondary | ICD-10-CM

## 2015-03-29 DIAGNOSIS — E785 Hyperlipidemia, unspecified: Secondary | ICD-10-CM

## 2015-03-29 DIAGNOSIS — I10 Essential (primary) hypertension: Secondary | ICD-10-CM | POA: Diagnosis not present

## 2015-03-29 DIAGNOSIS — E119 Type 2 diabetes mellitus without complications: Secondary | ICD-10-CM | POA: Diagnosis not present

## 2015-03-29 DIAGNOSIS — M79661 Pain in right lower leg: Secondary | ICD-10-CM

## 2015-03-29 DIAGNOSIS — M5440 Lumbago with sciatica, unspecified side: Secondary | ICD-10-CM

## 2015-03-29 DIAGNOSIS — N3001 Acute cystitis with hematuria: Secondary | ICD-10-CM | POA: Diagnosis not present

## 2015-03-29 DIAGNOSIS — Z1159 Encounter for screening for other viral diseases: Secondary | ICD-10-CM

## 2015-03-29 DIAGNOSIS — Z1231 Encounter for screening mammogram for malignant neoplasm of breast: Secondary | ICD-10-CM

## 2015-03-29 DIAGNOSIS — E559 Vitamin D deficiency, unspecified: Secondary | ICD-10-CM

## 2015-03-29 DIAGNOSIS — Z1239 Encounter for other screening for malignant neoplasm of breast: Secondary | ICD-10-CM

## 2015-03-29 DIAGNOSIS — Z1211 Encounter for screening for malignant neoplasm of colon: Secondary | ICD-10-CM

## 2015-03-29 LAB — POCT URINALYSIS DIPSTICK
BILIRUBIN UA: NEGATIVE
Glucose, UA: NEGATIVE
KETONES UA: NEGATIVE
Nitrite, UA: NEGATIVE
PH UA: 6
Protein, UA: NEGATIVE
Spec Grav, UA: 1.025
Urobilinogen, UA: 0.2

## 2015-03-29 MED ORDER — METHYLPREDNISOLONE ACETATE 80 MG/ML IJ SUSP
80.0000 mg | Freq: Once | INTRAMUSCULAR | Status: AC
  Administered 2015-03-29: 80 mg via INTRAMUSCULAR

## 2015-03-29 MED ORDER — KETOROLAC TROMETHAMINE 60 MG/2ML IM SOLN
60.0000 mg | Freq: Once | INTRAMUSCULAR | Status: AC
  Administered 2015-03-29: 60 mg via INTRAMUSCULAR

## 2015-03-29 NOTE — Assessment & Plan Note (Signed)
Angela [name] is reminded of the importance of commitment to daily physical activity for 30 minutes or more, as able and the need to limit carbohydrate intake to 30 to 60 grams per meal to help with blood sugar control.   The need to take medication as prescribed, test blood sugar as directed, and to call between visits if there is a concern that blood sugar is uncontrolled is also discussed.   Angela [name] is reminded of the importance of daily foot exam, annual eye examination, and good blood sugar, blood pressure and cholesterol control. Updated lab past due, pt to have this today  Diabetic Labs Latest Ref Rng 10/14/2014 08/31/2014 12/19/2013 08/26/2013 07/13/2013  HbA1c <5.7 % - 7.1(H) 6.7(H) 6.8(H) -  Microalbumin <2.0 mg/dL - 1.1 - 1.76 -  Micro/Creat Ratio 0.0 - 30.0 mg/g - 7.0 - 6.8 -  Chol 0 - 200 mg/dL - 173 159 - -  HDL >=46 mg/dL - 44(L) 46 - -  Calc LDL 0 - 99 mg/dL - 113(H) 95 - -  Triglycerides <150 mg/dL - 82 88 - -  Creatinine 0.44 - 1.00 mg/dL 0.84 0.78 0.69 0.69 0.65   BP/Weight 03/29/2015 02/26/2015 01/21/2015 01/01/2015 11/11/2014 10/26/2014 99991111  Systolic BP 0000000 A999333 Q000111Q [id number] Q000111Q Q000111Q A999333  Diastolic BP 96 98 80 90 84 82 86  Wt. (Lbs) 296 298 298.5 306.8 302 296 297  BMI 50.78 51.13 51.21 52.64 51.81 50.78 50.95   Foot/eye exam completion dates Latest Ref Rng 09/16/2014 06/02/2014  Eye Exam No Retinopathy - No Retinopathy  Foot Form Completion - Done -

## 2015-03-29 NOTE — Assessment & Plan Note (Signed)
Uncontrolled.Toradol and depo medrol administered IM in the office ,will need to f/u with ortho treating her with intra articular injections, she reports receiving the 3rd in a series of 3 injections, less than 2 weeks ago, but states uncontrolled and increased pain since past 1 week

## 2015-03-29 NOTE — Patient Instructions (Addendum)
F/u in 3 weeks, call if you need me sooner.  Urine to be tested today for infection, if none you will need to see urologist  Pls sign for note from Dr Montez Morita and Dr Theda Sers, last office visits  Labs today please  Injections today for knee pain  [place] of right leg to evaluate swelling and pain needed, will try to get today  Very important that you take all medications as prescribed , blood pressure is too high  Mammogram past due and is ordered

## 2015-03-29 NOTE — Progress Notes (Signed)
Subjective:    [name], female    DOB: 30-Jul-1960, 54 y.o.   MRN: [id number]  HPI   Angela [name]     MRN: [id number]      DOB: 11/21/1960   HPI Angela [name] is here for follow up and re-evaluation of chronic medical conditions, medication management and review of any available recent lab and radiology data.  Preventive health is updated, specifically  Cancer screening and Immunization.   Questions or concerns regarding consultations or procedures which the PT has had in the interim are  addressed. The PT denies any adverse reactions to current medications since the last visit.  New concerns are stated below  ROS Denies recent fever or chills.Denies SOB Denies sinus pressure, nasal congestion, ear pain or sore throat. Denies chest congestion, productive cough or wheezing. Denies chest pains, palpitations and c/o 2 month h/o right leg and calf swelling Denies abdominal pain, nausea, vomiting,diarrhea or constipation.   2 month , h/o frequency and incontinence, relying on pads 24 hrs daily Increased and uncontrolled joint pain, swelling and limitation in mobility.Right knee in particular Denies headaches, seizures, numbness, or tingling. Denies depression, anxiety or insomnia. Reports improvement in  skin break down from recent spider bite following antibiotic course from ED   PE  BP 160/96 mmHg  Pulse 76  Resp 18  Ht 5\' 4"  (1.626 m)  Wt 296 lb (134.265 kg)  BMI 50.78 kg/m2  SpO2 93%  Patient alert and oriented and in no cardiopulmonary distress.Pt in pain with signiificant difficulty with ambulatuon  HEENT: No facial asymmetry, EOMI,   oropharynx pink and moist.  Neck supple no JVD, no mass.  Chest: Clear to auscultation bilaterally.  CVS: S1, S2 no murmurs, no S3.Regular rate.  ABD: Soft non tender. No flank tenderness.  Ext: one plus pitting  Edema of right lower extremity, right calf tenderness, no erythema or warmth of right calf  MS: markedly  decreased  ROM spine, and knees.Bilateral knee deformity with marked limitation in mobility Skin: Intact,healing  rash noted.on right calf in area of recent spider bite, diameter max approx 4.5 cm  Psych: Good eye contact, normal affect. Memory intact not anxious or depressed appearing.  CNS: CN 2-12 intact, power,  normal throughout.no focal deficits noted.   Assessment & Plan   Essential hypertension Uncontrolled, not taking medication consistently as prescribed, /u in 3 weeks DASH diet and commitment to daily physical activity for a minimum of 30 minutes discussed and encouraged, as a part of hypertension management. The importance of attaining a healthy weight is also discussed.  BP/Weight 03/29/2015 02/26/2015 01/21/2015 01/01/2015 11/11/2014 10/26/2014 99991111  Systolic BP 0000000 A999333 Q000111Q [id number] Q000111Q Q000111Q A999333  Diastolic BP 96 98 80 90 84 82 86  Wt. (Lbs) 296 298 298.5 306.8 302 296 297  BMI 50.78 51.13 51.21 52.64 51.81 50.78 50.95        Diabetes mellitus without complication Angela [name] is reminded of the importance of commitment to daily physical activity for 30 minutes or more, as able and the need to limit carbohydrate intake to 30 to 60 grams per meal to help with blood sugar control.   The need to take medication as prescribed, test blood sugar as directed, and to call between visits if there is a concern that blood sugar is uncontrolled is also discussed.   Angela [name] is reminded of the importance of daily foot exam, annual eye examination, and good blood sugar, blood pressure and cholesterol  control. Updated lab past due, pt to have this today  Diabetic Labs Latest Ref Rng 10/14/2014 08/31/2014 12/19/2013 08/26/2013 07/13/2013  HbA1c <5.7 % - 7.1(H) 6.7(H) 6.8(H) -  Microalbumin <2.0 mg/dL - 1.1 - 1.76 -  Micro/Creat Ratio 0.0 - 30.0 mg/g - 7.0 - 6.8 -  Chol 0 - 200 mg/dL - 173 159 - -  HDL >=46 mg/dL - 44(L) 46 - -  Calc LDL 0 - 99 mg/dL - 113(H) 95 - -  Triglycerides <150 mg/dL  - 82 88 - -  Creatinine 0.44 - 1.00 mg/dL 0.84 0.78 0.69 0.69 0.65   BP/Weight 03/29/2015 02/26/2015 01/21/2015 01/01/2015 11/11/2014 10/26/2014 99991111  Systolic BP 0000000 A999333 Q000111Q [id number] Q000111Q Q000111Q A999333  Diastolic BP 96 98 80 90 84 82 86  Wt. (Lbs) 296 298 298.5 306.8 302 296 297  BMI 50.78 51.13 51.21 52.64 51.81 50.78 50.95   Foot/eye exam completion dates Latest Ref Rng 09/16/2014 06/02/2014  Eye Exam No Retinopathy - No Retinopathy  Foot Form Completion - Done -         Knee pain, right Uncontrolled.Toradol and depo medrol administered IM in the office ,will need to f/u with ortho treating her with intra articular injections, she reports receiving the 3rd in a series of 3 injections, less than 2 weeks ago, but states uncontrolled and increased pain since past 1 week  Acute cystitis with hematuria 2 month h/o excessive and uncontrolled urination, has been wearing kotex pads day and night. Has seeen gyne and will have a 2nd opinion , she arranged this independently [facility], just completed doxycycline for spider bite to right leg. Which is improving. Will hold on further antibiotic until c/s available. Trial of vesicare for symptom control if c/s is negative   Pain and swelling of right lower leg approx 1 month h/o symptom, has been treated for cellulitis of affected leg , and was recently evaluated by cardiologist, venous doppler study today negative dfor acute DVT  Colon cancer screening hAS not yet followed through on colonoscopy, but reports that she is working on this  Morbid obesity Unchanged. Patient re-educated about  the importance of commitment to a  minimum of 150 minutes of exercise per week.  The importance of healthy food choices with portion control discussed. Encouraged to start a food diary, count calories and to consider  joining a support group. Sample diet sheets offered. Goals set by the patient for the next several months.   Weight /BMI 03/29/2015 02/26/2015  01/21/2015  WEIGHT 296 lb 298 lb 298 lb 8 oz  HEIGHT 5\' 4"  5\' 4"  5\' 4"   BMI 50.78 kg/m2 51.13 kg/m2 51.21 kg/m2    Current exercise per week 30 minutes.Incapable due to severe osteoarthritis of knees   Hyperlipidemia LDL goal <100 Hyperlipidemia:Low fat diet discussed and encouraged.   Lipid Panel  Lab Results  Component Value Date   CHOL 173 08/31/2014   HDL 44* 08/31/2014   LDLCALC 113* 08/31/2014   TRIG 82 08/31/2014   CHOLHDL 3.9 08/31/2014      Uncontrolled , rept lab today  Back pain Unchanged and chronic, pain management to remain as before       Review of Systems     Objective:   Physical Exam        Assessment & Plan:

## 2015-03-29 NOTE — Assessment & Plan Note (Addendum)
2 month h/o excessive and uncontrolled urination, has been wearing kotex pads day and night. Has seeen gyne and will have a 2nd opinion , she arranged this independently [facility], just completed doxycycline for spider bite to right leg. Which is improving. Will hold on further antibiotic until c/s available. Trial of vesicare for symptom control if c/s is negative

## 2015-03-29 NOTE — Assessment & Plan Note (Signed)
Uncontrolled, not taking medication consistently as prescribed, /u in 3 weeks DASH diet and commitment to daily physical activity for a minimum of 30 minutes discussed and encouraged, as a part of hypertension management. The importance of attaining a healthy weight is also discussed.  BP/Weight 03/29/2015 02/26/2015 01/21/2015 01/01/2015 11/11/2014 10/26/2014 99991111  Systolic BP 0000000 A999333 Q000111Q [id number] Q000111Q Q000111Q A999333  Diastolic BP 96 98 80 90 84 82 86  Wt. (Lbs) 296 298 298.5 306.8 302 296 297  BMI 50.78 51.13 51.21 52.64 51.81 50.78 50.95

## 2015-03-30 DIAGNOSIS — M79661 Pain in right lower leg: Secondary | ICD-10-CM | POA: Insufficient documentation

## 2015-03-30 DIAGNOSIS — M7989 Other specified soft tissue disorders: Principal | ICD-10-CM

## 2015-03-30 NOTE — Assessment & Plan Note (Signed)
Hyperlipidemia:Low fat diet discussed and encouraged.   Lipid Panel  Lab Results  Component Value Date   CHOL 173 08/31/2014   HDL 44* 08/31/2014   LDLCALC 113* 08/31/2014   TRIG 82 08/31/2014   CHOLHDL 3.9 08/31/2014      Uncontrolled , rept lab today

## 2015-03-30 NOTE — Assessment & Plan Note (Signed)
hAS not yet followed through on colonoscopy, but reports that she is working on this

## 2015-03-30 NOTE — Assessment & Plan Note (Signed)
approx 1 month h/o symptom, has been treated for cellulitis of affected leg , and was recently evaluated by cardiologist, venous doppler study today negative dfor acute DVT

## 2015-03-30 NOTE — Assessment & Plan Note (Signed)
Unchanged and chronic, pain management to remain as before

## 2015-03-30 NOTE — Assessment & Plan Note (Signed)
Unchanged. Patient re-educated about  the importance of commitment to a  minimum of 150 minutes of exercise per week.  The importance of healthy food choices with portion control discussed. Encouraged to start a food diary, count calories and to consider  joining a support group. Sample diet sheets offered. Goals set by the patient for the next several months.   Weight /BMI 03/29/2015 02/26/2015 01/21/2015  WEIGHT 296 lb 298 lb 298 lb 8 oz  HEIGHT 5\' 4"  5\' 4"  5\' 4"   BMI 50.78 kg/m2 51.13 kg/m2 51.21 kg/m2    Current exercise per week 30 minutes.Incapable due to severe osteoarthritis of knees

## 2015-03-31 ENCOUNTER — Ambulatory Visit (HOSPITAL_COMMUNITY): Payer: Self-pay

## 2015-03-31 DIAGNOSIS — L110 Acquired keratosis follicularis: Secondary | ICD-10-CM | POA: Diagnosis not present

## 2015-03-31 DIAGNOSIS — B351 Tinea unguium: Secondary | ICD-10-CM | POA: Diagnosis not present

## 2015-03-31 DIAGNOSIS — E1140 Type 2 diabetes mellitus with diabetic neuropathy, unspecified: Secondary | ICD-10-CM | POA: Diagnosis not present

## 2015-03-31 DIAGNOSIS — E1142 Type 2 diabetes mellitus with diabetic polyneuropathy: Secondary | ICD-10-CM | POA: Diagnosis not present

## 2015-04-01 LAB — URINE CULTURE: Colony Count: 80000

## 2015-04-02 MED ORDER — CIPROFLOXACIN HCL 500 MG PO TABS: 500.0000 mg | ORAL_TABLET | Freq: Two times a day (BID) | ORAL | Status: DC

## 2015-04-02 MED ORDER — FLUCONAZOLE 150 MG PO TABS: 150.0000 mg | ORAL_TABLET | Freq: Once | ORAL | Status: DC

## 2015-04-02 NOTE — Addendum Note (Signed)
Addended by: Denman George B on: 04/02/2015 09:29 AM   Modules accepted: Orders

## 2015-04-03 ENCOUNTER — Other Ambulatory Visit: Payer: Self-pay | Admitting: Family Medicine

## 2015-04-05 ENCOUNTER — Ambulatory Visit (HOSPITAL_COMMUNITY): Payer: Self-pay

## 2015-04-09 ENCOUNTER — Other Ambulatory Visit: Payer: Self-pay

## 2015-04-09 MED ORDER — OXYCODONE HCL ER 40 MG PO T12A: 40.0000 mg | EXTENDED_RELEASE_TABLET | Freq: Two times a day (BID) | ORAL | Status: DC

## 2015-04-11 ENCOUNTER — Other Ambulatory Visit: Payer: Self-pay | Admitting: Family Medicine

## 2015-05-07 ENCOUNTER — Other Ambulatory Visit: Payer: Self-pay

## 2015-05-07 MED ORDER — OXYCODONE HCL ER 40 MG PO T12A: 40.0000 mg | EXTENDED_RELEASE_TABLET | Freq: Two times a day (BID) | ORAL | Status: DC

## 2015-05-12 ENCOUNTER — Other Ambulatory Visit: Payer: Self-pay | Admitting: Family Medicine

## 2015-05-18 DIAGNOSIS — M7542 Impingement syndrome of left shoulder: Secondary | ICD-10-CM | POA: Diagnosis not present

## 2015-05-18 DIAGNOSIS — M170 Bilateral primary osteoarthritis of knee: Secondary | ICD-10-CM | POA: Diagnosis not present

## 2015-05-25 ENCOUNTER — Other Ambulatory Visit: Payer: Self-pay | Admitting: Family Medicine

## 2015-05-25 DIAGNOSIS — Z1231 Encounter for screening mammogram for malignant neoplasm of breast: Secondary | ICD-10-CM

## 2015-05-31 ENCOUNTER — Ambulatory Visit (HOSPITAL_COMMUNITY): Payer: Self-pay

## 2015-06-02 ENCOUNTER — Ambulatory Visit (HOSPITAL_COMMUNITY): Payer: Self-pay

## 2015-06-03 ENCOUNTER — Ambulatory Visit (HOSPITAL_BASED_OUTPATIENT_CLINIC_OR_DEPARTMENT_OTHER): Payer: Self-pay

## 2015-06-03 ENCOUNTER — Other Ambulatory Visit: Payer: Self-pay

## 2015-06-03 MED ORDER — OXYCODONE HCL ER 40 MG PO T12A: 40.0000 mg | EXTENDED_RELEASE_TABLET | Freq: Two times a day (BID) | ORAL | Status: DC

## 2015-06-09 ENCOUNTER — Other Ambulatory Visit: Payer: Self-pay | Admitting: Family Medicine

## 2015-06-10 ENCOUNTER — Other Ambulatory Visit: Payer: Self-pay

## 2015-06-10 ENCOUNTER — Telehealth: Payer: Self-pay | Admitting: Family Medicine

## 2015-06-10 DIAGNOSIS — L309 Dermatitis, unspecified: Secondary | ICD-10-CM | POA: Diagnosis not present

## 2015-06-10 NOTE — Telephone Encounter (Signed)
Angela [name] is asking to speak to a nurse, please advise?

## 2015-06-14 NOTE — Telephone Encounter (Signed)
PA approved - pt aware ?

## 2015-06-15 ENCOUNTER — Other Ambulatory Visit: Payer: Self-pay

## 2015-06-15 MED ORDER — METFORMIN HCL 1000 MG PO TABS: ORAL_TABLET | ORAL | Status: DC

## 2015-06-16 DIAGNOSIS — M79672 Pain in left foot: Secondary | ICD-10-CM | POA: Diagnosis not present

## 2015-06-16 DIAGNOSIS — B351 Tinea unguium: Secondary | ICD-10-CM | POA: Diagnosis not present

## 2015-06-16 DIAGNOSIS — M2042 Other hammer toe(s) (acquired), left foot: Secondary | ICD-10-CM | POA: Diagnosis not present

## 2015-06-16 DIAGNOSIS — M79675 Pain in left toe(s): Secondary | ICD-10-CM | POA: Diagnosis not present

## 2015-06-20 ENCOUNTER — Other Ambulatory Visit: Payer: Self-pay | Admitting: Family Medicine

## 2015-06-24 DIAGNOSIS — L921 Necrobiosis lipoidica, not elsewhere classified: Secondary | ICD-10-CM | POA: Diagnosis not present

## 2015-07-01 ENCOUNTER — Telehealth: Payer: Self-pay | Admitting: Family Medicine

## 2015-07-01 NOTE — Telephone Encounter (Signed)
Chest congestion and throat congestion, coughing, throat hurting, sweating and chills x's 2 days, asking if something to be called in [facility]

## 2015-07-02 ENCOUNTER — Other Ambulatory Visit: Payer: Self-pay

## 2015-07-02 MED ORDER — AZITHROMYCIN 250 MG PO TABS: ORAL_TABLET | ORAL | Status: DC

## 2015-07-02 MED ORDER — OXYCODONE HCL ER 40 MG PO T12A: 40.0000 mg | EXTENDED_RELEASE_TABLET | Freq: Two times a day (BID) | ORAL | Status: DC

## 2015-07-02 NOTE — Telephone Encounter (Signed)
See msg below. Yellow mucus as well, unsure of fever but feels bad. Please advise

## 2015-07-02 NOTE — Telephone Encounter (Signed)
Med sent and called pt to make her aware

## 2015-07-02 NOTE — Addendum Note (Signed)
Addended by: Eual Fines on: 07/02/2015 12:51 PM   Modules accepted: Orders

## 2015-07-02 NOTE — Telephone Encounter (Signed)
plsm send z pack x 1 and let her know

## 2015-07-06 ENCOUNTER — Other Ambulatory Visit: Payer: Self-pay | Admitting: Family Medicine

## 2015-07-08 ENCOUNTER — Other Ambulatory Visit: Payer: Self-pay

## 2015-07-08 MED ORDER — FLUTICASONE PROPIONATE 50 MCG/ACT NA SUSP: 2.0000 | Freq: Every day | NASAL | Status: DC

## 2015-07-12 ENCOUNTER — Other Ambulatory Visit: Payer: Self-pay | Admitting: Family Medicine

## 2015-07-15 ENCOUNTER — Ambulatory Visit (HOSPITAL_BASED_OUTPATIENT_CLINIC_OR_DEPARTMENT_OTHER): Payer: Medicare Other | Attending: Pulmonary Disease

## 2015-07-27 ENCOUNTER — Ambulatory Visit (INDEPENDENT_AMBULATORY_CARE_PROVIDER_SITE_OTHER): Payer: Medicare Other | Admitting: Family Medicine

## 2015-07-27 ENCOUNTER — Encounter: Payer: Self-pay | Admitting: Family Medicine

## 2015-07-27 VITALS — BP 150/92 | HR 81 | Resp 16 | Ht 64.0 in | Wt 310.0 lb

## 2015-07-27 DIAGNOSIS — Z23 Encounter for immunization: Secondary | ICD-10-CM

## 2015-07-27 DIAGNOSIS — J4541 Moderate persistent asthma with (acute) exacerbation: Secondary | ICD-10-CM | POA: Diagnosis not present

## 2015-07-27 DIAGNOSIS — E559 Vitamin D deficiency, unspecified: Secondary | ICD-10-CM | POA: Diagnosis not present

## 2015-07-27 DIAGNOSIS — E119 Type 2 diabetes mellitus without complications: Secondary | ICD-10-CM | POA: Diagnosis not present

## 2015-07-27 DIAGNOSIS — M5440 Lumbago with sciatica, unspecified side: Secondary | ICD-10-CM

## 2015-07-27 DIAGNOSIS — E785 Hyperlipidemia, unspecified: Secondary | ICD-10-CM | POA: Diagnosis not present

## 2015-07-27 DIAGNOSIS — I10 Essential (primary) hypertension: Secondary | ICD-10-CM | POA: Diagnosis not present

## 2015-07-27 DIAGNOSIS — F418 Other specified anxiety disorders: Secondary | ICD-10-CM

## 2015-07-27 MED ORDER — PREDNISONE 5 MG (21) PO TBPK: 5.0000 mg | ORAL_TABLET | ORAL | Status: DC

## 2015-07-27 MED ORDER — KETOROLAC TROMETHAMINE 60 MG/2ML IM SOLN
60.0000 mg | Freq: Once | INTRAMUSCULAR | Status: AC
  Administered 2015-07-27: 60 mg via INTRAMUSCULAR

## 2015-07-27 MED ORDER — IPRATROPIUM BROMIDE 0.02 % IN SOLN
0.5000 mg | Freq: Once | RESPIRATORY_TRACT | Status: AC
Start: 2015-07-27 — End: 2015-07-27
  Administered 2015-07-27: 0.5 mg via RESPIRATORY_TRACT

## 2015-07-27 MED ORDER — METHYLPREDNISOLONE ACETATE 80 MG/ML IJ SUSP
80.0000 mg | Freq: Once | INTRAMUSCULAR | Status: AC
  Administered 2015-07-27: 80 mg via INTRAMUSCULAR

## 2015-07-27 MED ORDER — IPRATROPIUM-ALBUTEROL 0.5-2.5 (3) MG/3ML IN SOLN: 3.0000 mL | Freq: Four times a day (QID) | RESPIRATORY_TRACT | Status: DC | PRN

## 2015-07-27 MED ORDER — ALBUTEROL SULFATE (2.5 MG/3ML) 0.083% IN NEBU
2.5000 mg | INHALATION_SOLUTION | Freq: Once | RESPIRATORY_TRACT | Status: AC
  Administered 2015-07-27: 2.5 mg via RESPIRATORY_TRACT

## 2015-07-27 NOTE — Assessment & Plan Note (Signed)
Uncontroled, no med change at this vcist DASH diet and commitment to daily physical activity for a minimum of 30 minutes discussed and encouraged, as a part of hypertension management. The importance of attaining a healthy weight is also discussed.  BP/Weight 07/27/2015 03/29/2015 02/26/2015 01/21/2015 01/01/2015 11/11/2014 123456  Systolic BP Q000111Q 0000000 A999333 Q000111Q [id number] Q000111Q Q000111Q  Diastolic BP 92 96 98 80 90 84 82  Wt. (Lbs) 310 296 298 298.5 306.8 302 296  BMI 53.19 50.78 51.13 51.21 52.64 51.81 50.78

## 2015-07-27 NOTE — Progress Notes (Signed)
   Subjective:    [name], female    DOB: 1960/11/11, 55 y.o.   MRN: [id number]  HPI 3 week h/o increasing cough, wheeze and shortness of breath, clear mucus, no fever or chills. Denies sinus pressure, ear pain or sore throat C/o excessive weight gain,increased shortness of breath, and poor exercise tolerance Reports elevated blood sugars Denies polyuria, polydipsia, blurred vision , or hypoglycemic episodes. Increased back pain wants injections for this, less mobile    Review of Systems See HPI Denies recent fever or chills. Denies sinus pressure, nasal congestion, ear pain or sore throat.  Denies chest pains, palpitations and leg swelling Denies abdominal pain, nausea, vomiting,diarrhea or constipation.   Denies dysuria, frequency, hesitancy or incontinence.  Denies headaches, seizures, numbness, or tingling. Denies depression, anxiety or insomnia. Denies skin break down or rash.      Objective:   Physical Exam  BP 150/92 mmHg  Pulse 81  Resp 16  Ht 5\' 4"  (1.626 m)  Wt 310 lb (140.615 kg)  BMI 53.19 kg/m2  SpO2 97% Patient alert and oriented and in no cardiopulmonary distress.  HEENT: No facial asymmetry, EOMI,   oropharynx pink and moist.  Neck supple no JVD, no mass.  Chest:Decreased air entry, bilateral wheezes, no crackles  CVS: S1, S2 no murmurs, no S3.Regular rate.  ABD: Soft non tender.   Ext: No edema  MS: decreased  ROM spine, adequate in  shoulders, hips and knees.  Skin: Intact, no ulcerations or rash noted.  Psych: Good eye contact, normal affect. Memory intact not anxious or depressed appearing.  CNS: CN 2-12 intact, power,  normal throughout.no focal deficits noted.      Assessment & Plan:  Asthma Acute flare, neb treatment and depo medrol and pred  Back pain Uncontrolled.Toradol and depo medrol administered IM in the office ,   Essential hypertension Uncontroled, no med change at this vcist DASH diet and  commitment to daily physical activity for a minimum of 30 minutes discussed and encouraged, as a part of hypertension management. The importance of attaining a healthy weight is also discussed.  BP/Weight 07/27/2015 03/29/2015 02/26/2015 01/21/2015 01/01/2015 11/11/2014 123456  Systolic BP Q000111Q 0000000 A999333 Q000111Q [id number] Q000111Q Q000111Q  Diastolic BP 92 96 98 80 90 84 82  Wt. (Lbs) 310 296 298 298.5 306.8 302 296  BMI 53.19 50.78 51.13 51.21 52.64 51.81 50.78        Diabetes mellitus without complication Updated lab needed at/ before next visit. DASH diet and commitment to daily physical activity for a minimum of 30 minutes discussed and encouraged, as a part of hypertension management. The importance of attaining a healthy weight is also discussed.  BP/Weight 07/27/2015 03/29/2015 02/26/2015 01/21/2015 01/01/2015 11/11/2014 123456  Systolic BP Q000111Q 0000000 A999333 Q000111Q [id number] Q000111Q Q000111Q  Diastolic BP 92 96 98 80 90 84 82  Wt. (Lbs) 310 296 298 298.5 306.8 302 296  BMI 53.19 50.78 51.13 51.21 52.64 51.81 50.78        Depression with anxiety Controlled, no change in medication   Hyperlipidemia LDL goal <100 Hyperlipidemia:Low fat diet discussed and encouraged.   Lipid Panel  Lab Results  Component Value Date   CHOL 173 08/31/2014   HDL 44* 08/31/2014   LDLCALC 113* 08/31/2014   TRIG 82 08/31/2014   CHOLHDL 3.9 08/31/2014   Updated lab needed at/ before next visit.

## 2015-07-27 NOTE — Assessment & Plan Note (Signed)
Uncontrolled.Toradol and depo medrol administered IM in the office , 

## 2015-07-27 NOTE — Assessment & Plan Note (Signed)
Controlled, no change in medication  

## 2015-07-27 NOTE — Assessment & Plan Note (Signed)
Acute flare, neb treatment and depo medrol and pred

## 2015-07-27 NOTE — Assessment & Plan Note (Signed)
Hyperlipidemia:Low fat diet discussed and encouraged.   Lipid Panel  Lab Results  Component Value Date   CHOL 173 08/31/2014   HDL 44* 08/31/2014   LDLCALC 113* 08/31/2014   TRIG 82 08/31/2014   CHOLHDL 3.9 08/31/2014   Updated lab needed at/ before next visit.

## 2015-07-27 NOTE — Patient Instructions (Addendum)
F/u in 6 to 8 weeks, call if you need me sooner  \You are having flare of asthma, neb treatment, injeection in office and prednisone dose pack  Neb solution sent to you pharmacy  You are referred for eye exam   Toradol for back pain today  Flu vaccine today  Labs today  Change eating to regular schedule as we discussed so that you can get control of your weight and health  Pls bring medications to next visit, blood pressure elevated today, but no change in medications now  Thanks for choosing [facility], we consider it a privelige to serve you. Your health WILL improve

## 2015-07-27 NOTE — Assessment & Plan Note (Signed)
Updated lab needed at/ before next visit. DASH diet and commitment to daily physical activity for a minimum of 30 minutes discussed and encouraged, as a part of hypertension management. The importance of attaining a healthy weight is also discussed.  BP/Weight 07/27/2015 03/29/2015 02/26/2015 01/21/2015 01/01/2015 11/11/2014 123456  Systolic BP Q000111Q 0000000 A999333 Q000111Q [id number] Q000111Q Q000111Q  Diastolic BP 92 96 98 80 90 84 82  Wt. (Lbs) 310 296 298 298.5 306.8 302 296  BMI 53.19 50.78 51.13 51.21 52.64 51.81 50.78

## 2015-07-28 ENCOUNTER — Other Ambulatory Visit: Payer: Self-pay

## 2015-07-28 DIAGNOSIS — E559 Vitamin D deficiency, unspecified: Secondary | ICD-10-CM

## 2015-07-28 LAB — LIPID PANEL
CHOLESTEROL: 145 mg/dL (ref 125–200)
HDL: 48 mg/dL (ref >=46)
LDL Cholesterol: 81 mg/dL (ref <130)
TRIGLYCERIDES: 80 mg/dL (ref <150)
Total CHOL/HDL Ratio: 3 Ratio (ref <=5.0)
VLDL: 16 mg/dL (ref <30)

## 2015-07-28 LAB — VITAMIN D 25 HYDROXY (VIT D DEFICIENCY, FRACTURES): VIT D 25 HYDROXY: 12 ng/mL — AB (ref 30–100)

## 2015-07-28 LAB — BASIC METABOLIC PANEL WITH GFR
BUN: 12 mg/dL (ref 7–25)
CALCIUM: 8.9 mg/dL (ref 8.6–10.4)
CO2: 27 mmol/L (ref 20–31)
CREATININE: 0.75 mg/dL (ref 0.50–1.05)
Chloride: 101 mmol/L (ref 98–110)
GFR, Est Non African American: >89 mL/min (ref >=60)
Glucose, Bld: 118 mg/dL — ABNORMAL HIGH (ref 65–99)
Potassium: 3.7 mmol/L (ref 3.5–5.3)
Sodium: 141 mmol/L (ref 135–146)

## 2015-07-28 LAB — HIV ANTIBODY (ROUTINE TESTING W REFLEX): HIV 1&2 Ab, 4th Generation: NONREACTIVE

## 2015-07-28 LAB — TSH: TSH: 1.9 m[IU]/L

## 2015-07-28 LAB — HEMOGLOBIN A1C
HEMOGLOBIN A1C: 6.6 % — AB (ref <5.7)
Mean Plasma Glucose: 143 mg/dL

## 2015-07-28 LAB — HEPATITIS C ANTIBODY: HCV Ab: NEGATIVE

## 2015-07-28 MED ORDER — ERGOCALCIFEROL 1.25 MG (50000 UT) PO CAPS: 50000.0000 [IU] | ORAL_CAPSULE | ORAL | Status: DC

## 2015-07-30 ENCOUNTER — Other Ambulatory Visit: Payer: Self-pay

## 2015-07-30 MED ORDER — OXYCODONE HCL ER 40 MG PO T12A: 40.0000 mg | EXTENDED_RELEASE_TABLET | Freq: Two times a day (BID) | ORAL | Status: DC

## 2015-08-05 ENCOUNTER — Other Ambulatory Visit: Payer: Self-pay | Admitting: Family Medicine

## 2015-08-11 DIAGNOSIS — L110 Acquired keratosis follicularis: Secondary | ICD-10-CM | POA: Diagnosis not present

## 2015-08-11 DIAGNOSIS — E1142 Type 2 diabetes mellitus with diabetic polyneuropathy: Secondary | ICD-10-CM | POA: Diagnosis not present

## 2015-08-11 DIAGNOSIS — B351 Tinea unguium: Secondary | ICD-10-CM | POA: Diagnosis not present

## 2015-08-11 DIAGNOSIS — E1140 Type 2 diabetes mellitus with diabetic neuropathy, unspecified: Secondary | ICD-10-CM | POA: Diagnosis not present

## 2015-08-12 ENCOUNTER — Other Ambulatory Visit: Payer: Self-pay | Admitting: Family Medicine

## 2015-08-18 ENCOUNTER — Ambulatory Visit (HOSPITAL_COMMUNITY)
Admission: RE | Admit: 2015-08-18 | Discharge: 2015-08-18 | Disposition: A | Discharge status: Home / Self Care | Payer: Medicare Other | Source: Ambulatory Visit | Attending: Family Medicine | Admitting: Family Medicine

## 2015-08-18 DIAGNOSIS — Z1231 Encounter for screening mammogram for malignant neoplasm of breast: Secondary | ICD-10-CM | POA: Insufficient documentation

## 2015-08-26 ENCOUNTER — Other Ambulatory Visit: Payer: Self-pay | Admitting: Family Medicine

## 2015-08-26 ENCOUNTER — Other Ambulatory Visit: Payer: Self-pay

## 2015-08-26 MED ORDER — OXYCODONE HCL ER 40 MG PO T12A: 40.0000 mg | EXTENDED_RELEASE_TABLET | Freq: Two times a day (BID) | ORAL | Status: DC

## 2015-08-31 ENCOUNTER — Other Ambulatory Visit: Payer: Self-pay | Admitting: Family Medicine

## 2015-09-14 ENCOUNTER — Encounter: Payer: Self-pay | Admitting: Family Medicine

## 2015-09-14 ENCOUNTER — Ambulatory Visit (INDEPENDENT_AMBULATORY_CARE_PROVIDER_SITE_OTHER): Payer: Medicare Other | Admitting: Family Medicine

## 2015-09-14 VITALS — BP 160/100 | HR 86 | Resp 16 | Ht 64.0 in | Wt 310.0 lb

## 2015-09-14 DIAGNOSIS — E785 Hyperlipidemia, unspecified: Secondary | ICD-10-CM

## 2015-09-14 DIAGNOSIS — E119 Type 2 diabetes mellitus without complications: Secondary | ICD-10-CM

## 2015-09-14 DIAGNOSIS — R058 Other specified cough: Secondary | ICD-10-CM

## 2015-09-14 DIAGNOSIS — F418 Other specified anxiety disorders: Secondary | ICD-10-CM

## 2015-09-14 DIAGNOSIS — M62830 Muscle spasm of back: Secondary | ICD-10-CM | POA: Diagnosis not present

## 2015-09-14 DIAGNOSIS — Z23 Encounter for immunization: Secondary | ICD-10-CM | POA: Diagnosis not present

## 2015-09-14 DIAGNOSIS — R21 Rash and other nonspecific skin eruption: Secondary | ICD-10-CM | POA: Diagnosis not present

## 2015-09-14 DIAGNOSIS — L409 Psoriasis, unspecified: Secondary | ICD-10-CM | POA: Insufficient documentation

## 2015-09-14 DIAGNOSIS — I10 Essential (primary) hypertension: Secondary | ICD-10-CM

## 2015-09-14 DIAGNOSIS — R05 Cough: Secondary | ICD-10-CM

## 2015-09-14 MED ORDER — CYCLOBENZAPRINE HCL 10 MG PO TABS: 10.0000 mg | ORAL_TABLET | Freq: Three times a day (TID) | ORAL | Status: DC

## 2015-09-14 MED ORDER — AMLODIPINE BESYLATE 10 MG PO TABS: 10.0000 mg | ORAL_TABLET | Freq: Every day | ORAL | Status: DC

## 2015-09-14 NOTE — Progress Notes (Signed)
Subjective:    [name], female    DOB: Apr 25, 1961, 55 y.o.   MRN: [id number]  HPI   Angela [name]     MRN: [id number]      DOB: [birth year]   HPI Angela [name] is here for follow up and re-evaluation of chronic medical conditions, medication management and review of any available recent lab and radiology data.  Preventive health is updated, specifically  Cancer screening and Immunization.   Questions or concerns regarding consultations or procedures which the PT has had in the interim are  addressed. The PT denies any adverse reactions to current medications since the last visit. However states current muscle relaxant is ineffective Increased stress and anxiety due to her daughter's health Increase in size of rash on right leg needs to return to dermatology C/o excessive weight gain  ROS Denies recent fever or chills. Denies sinus pressure, nasal congestion, ear pain or sore throat. Denies chest congestion, productive cough or wheezing. Denies chest pains, palpitations and leg swelling Denies abdominal pain, nausea, vomiting,diarrhea or constipation.   Denies dysuria, frequency, hesitancy or incontinence. C/o  joint pain, swelling and limitation in mobility.Increased and uncontrolled muscle spasm, prescribed med is ineffective wants to revert to prior medication Denies headaches, seizures, numbness, or tingling. Denies uncontrolled  depression, anxiety or insomnia. Denies skin break down or rash.   PE  BP 160/100 mmHg  Pulse 86  Resp 16  Ht 5\' 4"  (1.626 m)  Wt 310 lb (140.615 kg)  BMI 53.19 kg/m2  SpO2 97%  Patient alert and oriented and in no cardiopulmonary distress.  HEENT: No facial asymmetry, EOMI,   oropharynx pink and moist.  Neck supple no JVD, no mass.  Chest: Clear to auscultation bilaterally.  CVS: S1, S2 no murmurs, no S3.Regular rate.  ABD: Soft non tender.   Ext: No edema  MS: decreased  ROM spine, shoulders, hips and knees.  Skin:  Intact,hyperpigmented macular rash on right leg, diameter approx 10 cm. No drainage or open skin noted  Psych: Good eye contact, normal affect. Memory intact not anxious or depressed appearing.  CNS: CN 2-12 intact, power,  normal throughout.no focal deficits noted.   Assessment & Plan   Essential hypertension Uncontrolled, add amlodipine DASH diet and commitment to daily physical activity for a minimum of 30 minutes discussed and encouraged, as a part of hypertension management. The importance of attaining a healthy weight is also discussed.  BP/Weight 09/14/2015 07/27/2015 03/29/2015 02/26/2015 01/21/2015 01/01/2015 123456  Systolic BP 0000000 Q000111Q 0000000 A999333 Q000111Q [id number] Q000111Q  Diastolic BP 123XX123 92 96 98 80 90 84  Wt. (Lbs) 310 310 296 298 298.5 306.8 302  BMI 53.19 53.19 50.78 51.13 51.21 52.64 51.81        Diabetes mellitus without complication Controlled, no change in medication Angela [name] is reminded of the importance of commitment to daily physical activity for 30 minutes or more, as able and the need to limit carbohydrate intake to 30 to 60 grams per meal to help with blood sugar control.   The need to take medication as prescribed, test blood sugar as directed, and to call between visits if there is a concern that blood sugar is uncontrolled is also discussed.   Angela [name] is reminded of the importance of daily foot exam, annual eye examination, and good blood sugar, blood pressure and cholesterol control.  Diabetic Labs Latest Ref Rng 07/27/2015 10/14/2014 08/31/2014 12/19/2013 08/26/2013  HbA1c <5.7 % 6.6(H) - 7.1(H) 6.7(H) 6.8(H)  Microalbumin <2.0 mg/dL - - 1.1 - 1.76  Micro/Creat Ratio 0.0 - 30.0 mg/g - - 7.0 - 6.8  Chol 125 - 200 mg/dL 145 - 173 159 -  HDL >=46 mg/dL 48 - 44(L) 46 -  Calc LDL <130 mg/dL 81 - 113(H) 95 -  Triglycerides <150 mg/dL 80 - 82 88 -  Creatinine 0.50 - 1.05 mg/dL 0.75 0.84 0.78 0.69 0.69   BP/Weight 09/14/2015 07/27/2015 03/29/2015 02/26/2015 01/21/2015  01/01/2015 123456  Systolic BP 0000000 Q000111Q 0000000 A999333 Q000111Q [id number]-[id number] Q000111Q  Diastolic BP 123XX123 92 96 98 80 90 84  Wt. (Lbs) 310 310 296 298 298.5 306.8 302  BMI 53.19 53.19 50.78 51.13 51.21 52.64 51.81   Foot/eye exam completion dates Latest Ref Rng 09/14/2015 09/16/2014  Eye Exam No Retinopathy - -  Foot Form Completion - Done Done         Upper airway cough syndrome Controlled, no change in medication   Hyperlipidemia LDL goal <100 Hyperlipidemia:Low fat diet discussed and encouraged.   Lipid Panel  Lab Results  Component Value Date   CHOL 145 07/27/2015   HDL 48 07/27/2015   LDLCALC 81 07/27/2015   TRIG 80 07/27/2015   CHOLHDL 3.0 07/27/2015   Controlled, no change in medication      Back muscle spasm Uncontrolled, no response to current medication will change to medication that has worked in the past  Rash and nonspecific skin eruption Current flare and wrosening of rash on right leg where she has been treated in the past by dermatology for parasitic infestation, return for reval and further management  Depression with anxiety Handling ongoing personal stress through spiritual strength and faith, no interest in therapy currently, not suicidal or homicidal     Review of Systems     Objective:   Physical Exam        Assessment & Plan:

## 2015-09-14 NOTE — Patient Instructions (Addendum)
Wellness end July , call if you need me before  Blood pressure high, add amlodipine daily   Pneumonia 23 today  New medication for spasm is flexeril since zanaflex is ineffective, we will [place]  You are referred back to Dr Madelaine Bhat re rash on right leg  Foot exam today qualifies you for shoes, alsop script for neb machine to [place]  Non fasting cmp and eGFr, hBa1C, cBC for July visit, get mid July or after pls  Thank you  for choosing [facility]. We consider it a privelige to serve you.  Delivering excellent health care in a caring and  compassionate way is our goal.  Partnering with you,  so that together we can achieve this goal is our strategy.

## 2015-09-19 DIAGNOSIS — R05 Cough: Secondary | ICD-10-CM | POA: Insufficient documentation

## 2015-09-19 DIAGNOSIS — R058 Other specified cough: Secondary | ICD-10-CM | POA: Insufficient documentation

## 2015-09-19 NOTE — Assessment & Plan Note (Signed)
Hyperlipidemia:Low fat diet discussed and encouraged.   Lipid Panel  Lab Results  Component Value Date   CHOL 145 07/27/2015   HDL 48 07/27/2015   LDLCALC 81 07/27/2015   TRIG 80 07/27/2015   CHOLHDL 3.0 07/27/2015   Controlled, no change in medication

## 2015-09-19 NOTE — Assessment & Plan Note (Signed)
Uncontrolled, add amlodipine DASH diet and commitment to daily physical activity for a minimum of 30 minutes discussed and encouraged, as a part of hypertension management. The importance of attaining a healthy weight is also discussed.  BP/Weight 09/14/2015 07/27/2015 03/29/2015 02/26/2015 01/21/2015 01/01/2015 123456  Systolic BP 0000000 Q000111Q 0000000 A999333 Q000111Q [id number] Q000111Q  Diastolic BP 123XX123 92 96 98 80 90 84  Wt. (Lbs) 310 310 296 298 298.5 306.8 302  BMI 53.19 53.19 50.78 51.13 51.21 52.64 51.81

## 2015-09-19 NOTE — Assessment & Plan Note (Signed)
Controlled, no change in medication  

## 2015-09-19 NOTE — Assessment & Plan Note (Signed)
Handling ongoing personal stress through spiritual strength and faith, no interest in therapy currently, not suicidal or homicidal

## 2015-09-19 NOTE — Assessment & Plan Note (Signed)
Current flare and wrosening of rash on right leg where she has been treated in the past by dermatology for parasitic infestation, return for reval and further management

## 2015-09-19 NOTE — Assessment & Plan Note (Signed)
Uncontrolled, no response to current medication will change to medication that has worked in the past

## 2015-09-19 NOTE — Assessment & Plan Note (Signed)
Controlled, no change in medication Angela [name] is reminded of the importance of commitment to daily physical activity for 30 minutes or more, as able and the need to limit carbohydrate intake to 30 to 60 grams per meal to help with blood sugar control.   The need to take medication as prescribed, test blood sugar as directed, and to call between visits if there is a concern that blood sugar is uncontrolled is also discussed.   Angela [name] is reminded of the importance of daily foot exam, annual eye examination, and good blood sugar, blood pressure and cholesterol control.  Diabetic Labs Latest Ref Rng 07/27/2015 10/14/2014 08/31/2014 12/19/2013 08/26/2013  HbA1c <5.7 % 6.6(H) - 7.1(H) 6.7(H) 6.8(H)  Microalbumin <2.0 mg/dL - - 1.1 - 1.76  Micro/Creat Ratio 0.0 - 30.0 mg/g - - 7.0 - 6.8  Chol 125 - 200 mg/dL 145 - 173 159 -  HDL >=46 mg/dL 48 - 44(L) 46 -  Calc LDL <130 mg/dL 81 - 113(H) 95 -  Triglycerides <150 mg/dL 80 - 82 88 -  Creatinine 0.50 - 1.05 mg/dL 0.75 0.84 0.78 0.69 0.69   BP/Weight 09/14/2015 07/27/2015 03/29/2015 02/26/2015 01/21/2015 01/01/2015 123456  Systolic BP 0000000 Q000111Q 0000000 A999333 Q000111Q [id number] Q000111Q  Diastolic BP 123XX123 92 96 98 80 90 84  Wt. (Lbs) 310 310 296 298 298.5 306.8 302  BMI 53.19 53.19 50.78 51.13 51.21 52.64 51.81   Foot/eye exam completion dates Latest Ref Rng 09/14/2015 09/16/2014  Eye Exam No Retinopathy - -  Foot Form Completion - Done Done

## 2015-09-21 ENCOUNTER — Other Ambulatory Visit: Payer: Self-pay | Admitting: Family Medicine

## 2015-09-24 ENCOUNTER — Other Ambulatory Visit: Payer: Self-pay

## 2015-09-24 MED ORDER — OXYCODONE HCL ER 40 MG PO T12A: 40.0000 mg | EXTENDED_RELEASE_TABLET | Freq: Two times a day (BID) | ORAL | Status: DC

## 2015-09-28 ENCOUNTER — Other Ambulatory Visit: Payer: Self-pay | Admitting: Family Medicine

## 2015-09-30 ENCOUNTER — Other Ambulatory Visit: Payer: Self-pay | Admitting: Family Medicine

## 2015-10-20 ENCOUNTER — Other Ambulatory Visit: Payer: Self-pay | Admitting: Dermatology

## 2015-10-20 DIAGNOSIS — M79671 Pain in right foot: Secondary | ICD-10-CM | POA: Diagnosis not present

## 2015-10-20 DIAGNOSIS — B351 Tinea unguium: Secondary | ICD-10-CM | POA: Diagnosis not present

## 2015-10-20 DIAGNOSIS — D485 Neoplasm of uncertain behavior of skin: Secondary | ICD-10-CM | POA: Diagnosis not present

## 2015-10-20 DIAGNOSIS — L309 Dermatitis, unspecified: Secondary | ICD-10-CM | POA: Diagnosis not present

## 2015-10-20 DIAGNOSIS — L280 Lichen simplex chronicus: Secondary | ICD-10-CM | POA: Diagnosis not present

## 2015-10-20 DIAGNOSIS — M79672 Pain in left foot: Secondary | ICD-10-CM | POA: Diagnosis not present

## 2015-10-22 ENCOUNTER — Other Ambulatory Visit: Payer: Self-pay

## 2015-10-22 MED ORDER — OXYCODONE HCL ER 40 MG PO T12A: 40.0000 mg | EXTENDED_RELEASE_TABLET | Freq: Two times a day (BID) | ORAL | Status: DC

## 2015-10-26 ENCOUNTER — Ambulatory Visit (INDEPENDENT_AMBULATORY_CARE_PROVIDER_SITE_OTHER): Payer: Medicare Other

## 2015-10-26 VITALS — BP 138/72 | Wt 301.0 lb

## 2015-10-26 DIAGNOSIS — M5440 Lumbago with sciatica, unspecified side: Secondary | ICD-10-CM | POA: Diagnosis not present

## 2015-10-26 MED ORDER — KETOROLAC TROMETHAMINE 60 MG/2ML IM SOLN
60.0000 mg | Freq: Once | INTRAMUSCULAR | Status: AC
  Administered 2015-10-26: 60 mg via INTRAMUSCULAR

## 2015-10-26 MED ORDER — METHYLPREDNISOLONE ACETATE 80 MG/ML IJ SUSP
80.0000 mg | Freq: Once | INTRAMUSCULAR | Status: AC
  Administered 2015-10-26: 80 mg via INTRAMUSCULAR

## 2015-10-26 NOTE — Progress Notes (Signed)
Patient in for injections for lumbar pain.   Pain increase x 2 weeks.   She will be travelling soon to [place].  Injections given as ordered.  No voiced complaints.

## 2015-10-27 ENCOUNTER — Other Ambulatory Visit: Payer: Self-pay | Admitting: Family Medicine

## 2015-11-16 ENCOUNTER — Other Ambulatory Visit: Payer: Self-pay

## 2015-11-16 MED ORDER — OXYCODONE HCL ER 40 MG PO T12A: 40.0000 mg | EXTENDED_RELEASE_TABLET | Freq: Two times a day (BID) | ORAL | Status: DC

## 2015-11-17 ENCOUNTER — Other Ambulatory Visit: Payer: Self-pay | Admitting: Family Medicine

## 2015-12-02 ENCOUNTER — Telehealth: Payer: Self-pay | Admitting: Family Medicine

## 2015-12-02 NOTE — Telephone Encounter (Signed)
Angela [name] is stating that she has been feeling really bad since last Friday . She knows her BP is high, she has been taking her meds, left knee has been hurting and turning to the left side causing her to feel like she is going to fall. She is having a lot of dizziness as well

## 2015-12-03 ENCOUNTER — Ambulatory Visit (INDEPENDENT_AMBULATORY_CARE_PROVIDER_SITE_OTHER): Payer: Medicare Other | Admitting: Family Medicine

## 2015-12-03 ENCOUNTER — Encounter: Payer: Self-pay | Admitting: Family Medicine

## 2015-12-03 VITALS — BP 120/80 | HR 78 | Resp 16 | Ht 64.0 in | Wt 306.0 lb

## 2015-12-03 DIAGNOSIS — J4520 Mild intermittent asthma, uncomplicated: Secondary | ICD-10-CM

## 2015-12-03 DIAGNOSIS — I10 Essential (primary) hypertension: Secondary | ICD-10-CM

## 2015-12-03 DIAGNOSIS — R21 Rash and other nonspecific skin eruption: Secondary | ICD-10-CM

## 2015-12-03 DIAGNOSIS — G478 Other sleep disorders: Secondary | ICD-10-CM | POA: Diagnosis not present

## 2015-12-03 DIAGNOSIS — G4730 Sleep apnea, unspecified: Secondary | ICD-10-CM | POA: Diagnosis not present

## 2015-12-03 DIAGNOSIS — R42 Dizziness and giddiness: Secondary | ICD-10-CM

## 2015-12-03 DIAGNOSIS — J3089 Other allergic rhinitis: Secondary | ICD-10-CM

## 2015-12-03 MED ORDER — MONTELUKAST SODIUM 10 MG PO TABS: 10.0000 mg | ORAL_TABLET | Freq: Every day | ORAL | 3 refills | Status: DC

## 2015-12-03 MED ORDER — AZITHROMYCIN 250 MG PO TABS: ORAL_TABLET | ORAL | 0 refills | Status: DC

## 2015-12-03 MED ORDER — PREDNISONE 5 MG (21) PO TBPK: ORAL_TABLET | ORAL | 0 refills | Status: DC

## 2015-12-03 MED ORDER — HYDROCOD POLST-CPM POLST ER 10-8 MG/5ML PO SUER: 5.0000 mL | Freq: Every evening | ORAL | 0 refills | Status: DC | PRN

## 2015-12-03 NOTE — Telephone Encounter (Signed)
appt scheduled for 8/4

## 2015-12-03 NOTE — Progress Notes (Signed)
   Ciarah Deramo     MRN: [id number]      DOB: 1960/12/06   HPI Ms. Raine is here stating that 1 week ago, last Friday, felt "as if I was going to die" Stayed at her sister' house the entire weekend,No fever or chill, no urinary symptom Increased asthma cough this past weekend , responded to rescue inhaler Positive snoring , will get sleep study Frontal pressure and nasal congestion, with vertigo Felt back of left knee twitching last week, c/o frontal pressure with clear post nasal drainage, and dizziness  ROS Denies recent fever or chills. . Denies chest congestion, productive cough or wheezing. Denies chest pains, palpitations and leg swelling Denies abdominal pain, nausea, vomiting,diarrhea or constipation.   Denies dysuria, frequency, hesitancy or incontinence.  Denies headaches, seizures, numbness, or tingling. Denies depression, anxiety or insomnia. Denies skin break down or rash.   PE  BP 120/80 (BP Location: Left Arm, Patient Position: Standing, Cuff Size: Large)   Pulse 78   Resp 16   Ht 5\' 4"  (1.626 m)   Wt (!) 306 lb (138.8 kg)   SpO2 96%   BMI 52.52 kg/m   Patient alert and oriented and in no cardiopulmonary distress.  HEENT: No facial asymmetry, EOMI,   oropharynx pink and moist.  Neck supple no JVD, no mass.frontal sinus tenderness, and edema of nasal mucosa  Chest: Clear to auscultation bilaterally.  CVS: S1, S2 no murmurs, no S3.Regular rate.  ABD: Soft non tender.   Ext: No edema  [id number]  ROM spine, shoulders, hips and knees.  Skin: Intact, no ulcerations or rash noted.  Psych: Good eye contact, normal affect. Memory intact not anxious or depressed appearing.  CNS: CN 2-12 intact, power,  normal throughout.no focal deficits noted.   Assessment & Plan  Essential hypertension Controlled, no change in medication DASH diet and commitment to daily physical activity for a minimum of 30 minutes discussed and encouraged, as a part of  hypertension management. The importance of attaining a healthy weight is also discussed.  BP/Weight 12/03/2015 10/26/2015 09/14/2015 07/27/2015 03/29/2015 02/26/2015 0000000  Systolic BP 123456 0000000 0000000 Q000111Q 0000000 A999333 Q000111Q  Diastolic BP 80 72 123XX123 92 96 98 80  Wt. (Lbs) 306 301 310 310 296 298 298.5  BMI 52.52 51.64 53.19 53.19 50.78 51.13 51.21       Vertigo Current flare x 1 week, antivert prescribed for as needed use  Sleep apnea syndrome Positive h/o excess snoring , sometimes awakening her with chronic fatigue, refer for sleep study, and re eval  Allergic rhinitis Uncontrolled, needs to commit to daily singulair  And  nasal spray , meds sent to pharmacy  Rash and nonspecific skin eruption Improving under thae care of dermatology in [place], topical steroid prescribed  Asthma Increased cough and chest tightness with uncontrolled sinus allergy symptoms, start daily singulair, also cough suppressant for short term use as needed, prescribed

## 2015-12-05 DIAGNOSIS — R42 Dizziness and giddiness: Secondary | ICD-10-CM | POA: Insufficient documentation

## 2015-12-05 DIAGNOSIS — J309 Allergic rhinitis, unspecified: Secondary | ICD-10-CM | POA: Insufficient documentation

## 2015-12-05 NOTE — Assessment & Plan Note (Signed)
Controlled, no change in medication DASH diet and commitment to daily physical activity for a minimum of 30 minutes discussed and encouraged, as a part of hypertension management. The importance of attaining a healthy weight is also discussed.  BP/Weight 12/03/2015 10/26/2015 09/14/2015 07/27/2015 03/29/2015 02/26/2015 0000000  Systolic BP 123456 0000000 0000000 Q000111Q 0000000 A999333 Q000111Q  Diastolic BP 80 72 123XX123 92 96 98 80  Wt. (Lbs) 306 301 310 310 296 298 298.5  BMI 52.52 51.64 53.19 53.19 50.78 51.13 51.21

## 2015-12-05 NOTE — Assessment & Plan Note (Signed)
Increased cough and chest tightness with uncontrolled sinus allergy symptoms, start daily singulair, also cough suppressant for short term use as needed, prescribed

## 2015-12-05 NOTE — Assessment & Plan Note (Signed)
Uncontrolled, needs to commit to daily singulair  And  nasal spray , meds sent to pharmacy

## 2015-12-05 NOTE — Assessment & Plan Note (Signed)
Improving under thae care of dermatology in [place], topical steroid prescribed

## 2015-12-05 NOTE — Assessment & Plan Note (Signed)
Positive h/o excess snoring , sometimes awakening her with chronic fatigue, refer for sleep study, and re eval

## 2015-12-05 NOTE — Assessment & Plan Note (Signed)
Current flare x 1 week, antivert prescribed for as needed use

## 2015-12-06 ENCOUNTER — Encounter: Payer: Self-pay | Admitting: Family Medicine

## 2015-12-08 DIAGNOSIS — J45909 Unspecified asthma, uncomplicated: Secondary | ICD-10-CM | POA: Diagnosis not present

## 2015-12-08 DIAGNOSIS — E119 Type 2 diabetes mellitus without complications: Secondary | ICD-10-CM | POA: Diagnosis not present

## 2015-12-08 DIAGNOSIS — M21539 Acquired clawfoot, unspecified foot: Secondary | ICD-10-CM | POA: Diagnosis not present

## 2015-12-13 ENCOUNTER — Other Ambulatory Visit: Payer: Self-pay | Admitting: Family Medicine

## 2015-12-17 ENCOUNTER — Other Ambulatory Visit: Payer: Self-pay

## 2015-12-17 MED ORDER — OXYCODONE HCL ER 40 MG PO T12A: 40.0000 mg | EXTENDED_RELEASE_TABLET | Freq: Two times a day (BID) | ORAL | 0 refills | Status: DC

## 2015-12-21 ENCOUNTER — Other Ambulatory Visit: Payer: Self-pay | Admitting: Family Medicine

## 2015-12-24 DIAGNOSIS — M21539 Acquired clawfoot, unspecified foot: Secondary | ICD-10-CM | POA: Diagnosis not present

## 2015-12-24 DIAGNOSIS — E119 Type 2 diabetes mellitus without complications: Secondary | ICD-10-CM | POA: Diagnosis not present

## 2015-12-24 DIAGNOSIS — J45909 Unspecified asthma, uncomplicated: Secondary | ICD-10-CM | POA: Diagnosis not present

## 2015-12-29 ENCOUNTER — Encounter: Payer: Self-pay | Admitting: Neurology

## 2015-12-29 ENCOUNTER — Ambulatory Visit (INDEPENDENT_AMBULATORY_CARE_PROVIDER_SITE_OTHER): Payer: Medicare Other | Admitting: Neurology

## 2015-12-29 VITALS — BP 150/92 | HR 82 | Resp 22 | Ht 64.0 in | Wt 310.0 lb

## 2015-12-29 DIAGNOSIS — G4710 Hypersomnia, unspecified: Secondary | ICD-10-CM

## 2015-12-29 DIAGNOSIS — R351 Nocturia: Secondary | ICD-10-CM

## 2015-12-29 DIAGNOSIS — F1190 Opioid use, unspecified, uncomplicated: Secondary | ICD-10-CM | POA: Diagnosis not present

## 2015-12-29 DIAGNOSIS — R0683 Snoring: Secondary | ICD-10-CM

## 2015-12-29 NOTE — Patient Instructions (Signed)

## 2015-12-29 NOTE — Progress Notes (Signed)
Subjective:    [name] is a 55 y.o. female.  HPI     Angela Age, MD, PhD [facility] [address] [place], [place] [address]  Dear Angela [name],   I saw your patient, Angela [name], upon your kind request in my neurologic clinic today for initial consultation of her sleep disorder, in particular, concern for underlying obstructive sleep apnea. The patient is unaccompanied today. As you know, Angela [name] is a 55 year old right-handed woman with an underlying medical history of recurrent vertigo, allergic rhinitis, asthma, hypertension, chronic back pain, s/p 4 back surgeries (Dr. Joya Salm x 2, Dr. Harl Bowie x 2), on chronic narcotic pain medication, and morbid obesity, who reports snoring and excessive daytime somnolence. I reviewed your office note from 12/03/2015. She reports multiple nighttime awakenings and also waking up with a sense of choking, wakes up coughing as well. Asthma has been worsening. She has been oxycontin bid, but not when planning to drive.  She takes zanaflex qid. She has nocturia up to 6 times per night.    She sleeps elevated with up to 6 pillows. Her Epworth sleepiness score is 7 out of 24 today, her fatigue score is 29 out of 63. She does not keep a set bedtime and wake time. May be in bed by 7:30 PM, wakeup time around 6 AM perhaps. She has seen Angela [name] and was supposed to have a sleep study in the past but decided not to pursue it. She is still reluctant to come back for sleep study as she does not want anybody watching her sleep and she does not think she would be able to sleep. Nevertheless, she will think about it. She is congested currently in says she has a head cold. She has no history of restless leg symptoms. She has no family history of OSA. Mother died of cervical cancer and father died of prostate cancer. She is one of a total of 8 kids, none of her 7 siblings have OSA. She has one daughter, [name] 104,  who lives locally. She has no grandchildren. She does not work. 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 She is a nonsmoker and does not drink alcohol. She drinks several sodas per day. She lives alone.   Her Past Medical History Is Significant For: Past Medical History:  Diagnosis Date  . Anxiety   . Arthritis   . Asthma   . Coronary atherosclerosis of native coronary artery    Mild nonobstructive CAD by cardiac catheterization 2008 - Dr. Terrence Dupont  . Degenerative disc disease   . Depression   . Dysrhythmia   . Essential hypertension, benign   . Folliculitis 0000000  . GERD (gastroesophageal reflux disease)   . H/O hiatal hernia   . Helicobacter pylori gastritis 2008  . Hypercholesterolemia   . Hypertension   . Hyperthyroidism    s/p radiation  . Low back pain   . Migraine   . Nephrolithiasis    Recurring episodes since 2004  . Severe obesity (BMI >= 40) (HCC)   . Sleep apnea    STOP BANG SCORE 6no cpap used  . Type 2 diabetes mellitus with diabetic neuropathy (HCC)     Her Past Surgical History Is Significant For: Past Surgical History:  Procedure Laterality Date  . ABDOMINAL HYSTERECTOMY    . BACK SURGERY  1993  . BACK SURGERY  1994  .  BACK SURGERY  2002  . BACK SURGERY  2011   [facility]  . COLONOSCOPY  2000 [facility]   INT HEMORRHOIDS/FISSURE  . COLONOSCOPY  2003 [facility], CHANGE IN BOWEL HABITS   INT HEMORRHOIDS  . COLONOSCOPY  2006 [facility]   INT HEMORRHOIDS  . COLONOSCOPY  2007 [facility]   INT HEMORRHOIDS  . CYSTOSCOPY WITH STENT PLACEMENT Left 07/25/2012   Procedure: CYSTOSCOPY, left retograde pyelogram WITH left ureteral  STENT PLACEMENT;  Surgeon: Angela [name];  Location: [facility];  Service: Urology;  Laterality: Left;  . CYSTOSCOPY/RETROGRADE/URETEROSCOPY  12/04/2011   Procedure: CYSTOSCOPY/RETROGRADE/URETEROSCOPY;  Surgeon: Angela [name];  Location: [facility];   Service: Urology;  Laterality: Right;  . CYSTOSCOPY/RETROGRADE/URETEROSCOPY/STONE EXTRACTION WITH BASKET Left 09/03/2012   Procedure: CYSTOSCOPY/RETROGRADE pyelogram/digital URETEROSCOPY/STONE EXTRACTION WITH BASKET, left stent removal;  Surgeon: Angela [name];  Location: [facility];  Service: Urology;  Laterality: Left;  . ESOPHAGOGASTRODUODENOSCOPY  2008   [id number] hiatal hernia./Normal esophagus without evidence of Barrett's mass, stricture, erosion or ulceration./Normal duodenal bulb and second portion of the duodenum./Diffuse erythema in the body and the antrum with occasional erosion.  Biopsies obtained via cold forceps to evaluate for H. pylori gastritis  . FRACTURE SURGERY  1999   right clavicle  . HOLMIUM LASER APPLICATION Left [id number]   Procedure: HOLMIUM LASER APPLICATION;  Surgeon: Angela [name];  Location: [facility];  Service: Urology;  Laterality: Left;  . KNEE ARTHROSCOPY  10/04/2010,    right knee arthroscopy, dr Angela [name]  . left knee arthroscopic surgery  1999  . Left salphingectomy secondary to ectopic pregnancy  1991  . PARTIAL HYSTERECTOMY  1991  . rotary cuff  Right 05/14/2014   [facility]  . UPPER GASTROINTESTINAL ENDOSCOPY  2008 abd pain   H. pylori gastritis  . UPPER GASTROINTESTINAL ENDOSCOPY  1996 [facility] [place]   P[facility]    Her Family History Is Significant For: Family History  Problem Relation [name] of Onset  . Heart disease Mother   . Hypertension Mother   . Cancer Mother     Cervical   . Asthma Mother   . Heart attack Father   . Cancer Father     Prostate  . Colon cancer Father     DECEASED [name] 56  . Colon polyps Neg Hx     Her Social History Is Significant For: Social History   Social History  . Marital status: Single    Spouse name: N/A  . Number of children: 1  . Years of education: 68   Occupational History  . Disable     Social History Main Topics  . Smoking status: Never Smoker  . Smokeless tobacco: Never Used  . Alcohol use  No  . Drug use: No  . Sexual activity: Yes    Birth control/ protection: Surgical   Other Topics Concern  . None   Social History Narrative   Drinks soda daily     Her Allergies Are:  Allergies  Allergen Reactions  . Morphine Other (See Comments)    Reaction with esophagus, unable to swallow.   . Ace Inhibitors Cough  . Aspirin Other (See Comments)    Reaction with esophagus, unable to swallow.   . Losartan Cough  . Tomato Other (See Comments)    Acid reflux due to acid in tomato  . Adhesive [Tape] Rash  :   Her Current Medications Are:  Outpatient Encounter Prescriptions as of 12/29/2015  Medication Sig  . albuterol (PROAIR HFA) 108 (90 BASE) MCG/ACT inhaler Inhale 2 puffs into the lungs every 6 (six) hours as needed for wheezing or shortness of breath.  Marland Kitchen amLODipine (NORVASC) 10 MG tablet Take 1 tablet (10 mg total) by mouth daily.  Marland Kitchen atorvastatin (LIPITOR) 80 MG tablet TAKE 1 TABLET BY MOUTH EVERY DAY  . azithromycin (ZITHROMAX) 250 MG tablet Use as directed  . canagliflozin (INVOKANA) 300 MG TABS tablet Take 300 mg by mouth daily before breakfast.  . chlorpheniramine-HYDROcodone (TUSSIONEX PENNKINETIC ER) 10-8 MG/5ML SUER Take 5 mLs by mouth at bedtime as needed for cough.  . cloNIDine (CATAPRES) 0.3 MG tablet TAKE ONE TABLET AT 9 PM EVERY NIGHT FOR HIGH BLOOD PRESSURE  . cyclobenzaprine (FLEXERIL) 10 MG tablet Take 1 tablet (10 mg total) by mouth 3 (three) times daily.  . diazepam (VALIUM) 5 MG tablet TAKE 1 TABLET BY MOUTH 3 TIMES A DAY  . ergocalciferol (VITAMIN D2) 50000 units capsule Take 1 capsule (50,000 Units total) by mouth once a week. Takes on Monday  . esomeprazole (NEXIUM) 40 MG capsule TAKE 1 CAPSULE (40 MG TOTAL) BY MOUTH DAILY.  Marland Kitchen FLUoxetine (PROZAC) 40 MG capsule TAKE ONE CAPSULE BY MOUTH EVERY DAY  . fluticasone (FLONASE) 50 MCG/ACT nasal spray Place 2 sprays into both nostrils daily.  . furosemide (LASIX) 40 MG tablet TAKE 1 TABLET BY MOUTH TWICE A DAY   . gabapentin (NEURONTIN) 800 MG tablet TAKE 1 TABLET BY MOUTH 4 TIMES A DAY  . ipratropium-albuterol (DUONEB) 0.5-2.5 (3) MG/3ML SOLN Take 3 mLs by nebulization every 6 (six) hours as needed.  Marland Kitchen KLOR-CON M20 20 MEQ tablet TAKE 1 TABLET (20 MEQ TOTAL) BY MOUTH 2 (TWO) TIMES DAILY.  . metFORMIN (GLUCOPHAGE) 1000 MG tablet TAKE 1 TABLET (1,000 MG TOTAL) BY MOUTH 2 (TWO) TIMES DAILY WITH A MEAL.  . montelukast (SINGULAIR) 10 MG tablet Take 1 tablet (10 mg total) by mouth at bedtime.  Marland Kitchen NITROSTAT 0.4 MG SL tablet PLACE 1 TABLET UNDER TONGUE IF NEEDED FOR CHEST PAIN..MAX OF 3 TIMES  . oxyCODONE (OXYCONTIN) 40 mg 12 hr tablet Take 1 tablet (40 mg total) by mouth every 12 (twelve) hours.  . potassium chloride SA (K-DUR,KLOR-CON) 20 MEQ tablet Take 1 tablet (20 mEq total) by mouth 2 (two) times daily.  . predniSONE (STERAPRED UNI-PAK 21 TAB) 5 MG (21) TBPK tablet Use as directed  . PROAIR HFA 108 (90 BASE) MCG/ACT inhaler INHALE 2 PUFFS BY MOUTH EVERY 6-8HRS AS NEEDED  . SYMBICORT 160-4.5 MCG/ACT inhaler INHALE 2 PUUFS BY MOUTH TWICE DAILY  . tiZANidine (ZANAFLEX) 4 MG tablet TAKE 1 TABLET (4 MG TOTAL) BY MOUTH 3 (THREE) TIMES DAILY.  Marland Kitchen triamcinolone cream (KENALOG) 0.1 % Apply 1 application topically 2 (two) times daily.  Marland Kitchen triamterene-hydrochlorothiazide (MAXZIDE) 75-50 MG tablet TAKE 1 TABLET BY MOUTH DAILY.  Marland Kitchen zolpidem (AMBIEN) 10 MG tablet TAKE 1 TABLET AT BEDTIME   No facility-administered encounter medications on file as of 12/29/2015.   :  Review of Systems:  Out of a complete 14 point review of systems, all are reviewed and negative with the exception of these symptoms as listed below: Review of Systems  Neurological:       Patient sleeps on 6 pillows, gets up many times in the night, snoring, wakes up coughing and choking, headaches, some daytime fatigue.   Epworth Sleepiness Scale 0= would never doze 1= slight chance of dozing 2= moderate chance of dozing 3= high chance  of  dozing  Sitting and reading:2 Watching TV:2 Sitting inactive in a public place (ex. Theater or meeting):0 As a passenger in a car for an hour without a break:0 Lying down to rest in the afternoon:3 Sitting and talking to someone:0 Sitting quietly after lunch (no alcohol):0 In a car, while stopped in traffic:0 Total:7   Objective:  Neurologic Exam  Physical Exam Physical Examination:   Vitals:   12/29/15 1051  BP: (!) 150/92  Pulse: 82  Resp: (!) 22   General Examination: The patient is a very pleasant 55 y.o. female in no acute distress. She appears well-developed and well-nourished and well groomed.   HEENT: Normocephalic, atraumatic, pupils are equal, round and reactive to light and accommodation. Funduscopic exam is normal with sharp disc margins noted. Extraocular tracking is good without limitation to gaze excursion or nystagmus noted. Normal smooth pursuit is noted. Hearing is grossly intact. Tympanic membranes are clear bilaterally. Face is symmetric with normal facial animation and normal facial sensation. Speech is clear with no dysarthria noted. There is no hypophonia. There is no lip, neck/head, jaw or voice tremor. Neck is supple with full range of passive and active motion. There are no carotid bruits on auscultation. Oropharynx exam reveals: mild mouth dryness, adequate dental hygiene and marked airway crowding, due to large tongue, thicker soft palate and tonsils of 1-2 +. Mallampati is class III. Tongue protrudes centrally and palate elevates symmetrically. Neck size is 16.5 inches. She has a Mild overbite.   Chest: Clear to auscultation without wheezing, rhonchi or crackles noted.  Heart: S1+S2+0, regular and normal without murmurs, rubs or gallops noted.   Abdomen: Soft, non-tender and non-distended with normal bowel sounds appreciated on auscultation.  Extremities: There is no pitting edema in the distal lower extremities bilaterally. Pedal pulses are  intact.  Skin: Warm and dry without trophic changes noted. There are no varicose veins. Large scare R shin, has a spider bite last year.  Musculoskeletal: exam reveals LBP, and bilateral knee pain.   Neurologically:  Mental status: The patient is awake, alert and oriented in all 4 spheres. Her immediate and remote memory, attention, language skills and fund of knowledge are appropriate. There is no evidence of aphasia, agnosia, apraxia or anomia. Speech is clear with normal prosody and enunciation. Thought process is linear. Mood is normal and affect is normal.  Cranial nerves II - XII are as described above under HEENT exam. In addition: shoulder shrug is normal with equal shoulder height noted. Motor exam: Normal bulk, strength and tone is noted. There is no drift, tremor or rebound. Romberg is negative. Reflexes are 1+ in the UEs and trace in the LEs. Babinski: Toes are flexor bilaterally. Fine motor skills and coordination: intact with normal finger taps, normal hand movements, normal rapid alternating patting, normal foot taps and normal foot agility.  Cerebellar testing: No dysmetria or intention tremor on finger to nose testing. Heel to shin is unremarkable bilaterally. There is no truncal or gait ataxia.  Sensory exam: intact to light touch, pinprick, vibration, temperature sense in the upper and lower extremities but decreased sensation in the L leg, not new.  Gait, station and balance: She stands with difficulty, walks with a limp. Tandem walk is unremarkable.   Assessment and Plan:  In summary, Angela [name] is a very pleasant 54 y.o.-year old female with an underlying medical history of recurrent vertigo, allergic rhinitis, asthma, hypertension, arthritis, s/p b/l knee injections, R shoulder surgery, chronic LBP with s/p 4  back surgeries, and on chronic narcotic pain medication, and morbid obesity, whose history and physical exam are indeed concerning for obstructive sleep apnea (OSA). I  had a long chat with the patient about my findings and the diagnosis of OSA, its prognosis and treatment options. We talked about medical treatments, surgical interventions and non-pharmacological approaches. I explained in particular the risks and ramifications of untreated moderate to severe OSA, especially with respect to developing cardiovascular disease down the Road, including congestive heart failure, difficult to treat hypertension, cardiac arrhythmias, or stroke. Even type 2 diabetes has, in part, been linked to untreated OSA. Symptoms of untreated OSA include daytime sleepiness, memory problems, mood irritability and mood disorder such as depression and anxiety, lack of energy, as well as recurrent headaches, especially morning headaches. We talked about trying to maintain a healthy lifestyle in general, as well as the importance of weight control. I encouraged the patient to eat healthy, exercise daily and keep well hydrated, to keep a scheduled bedtime and wake time routine, to not skip any meals and eat healthy snacks in between meals. I advised the patient not to drive when feeling sleepy. I recommended the following at this time: sleep study with potential positive airway pressure titration. (We will score hypopneas at 4% and split the sleep study into diagnostic and treatment portion, if the estimated. 2 hour AHI is >20/h).   I explained the sleep test procedure to the patient and also outlined possible surgical and non-surgical treatment options of OSA, including the use of a custom-made dental device (which would require a referral to a specialist dentist or oral surgeon), upper airway surgical options, such as pillar implants, radiofrequency surgery, tongue base surgery, and UPPP (which would involve a referral to an ENT surgeon). Rarely, jaw surgery such as mandibular advancement may be considered.  I also explained the CPAP treatment option to the patient, who indicated that she would be  willing to try CPAP if the need arises. I explained the importance of being compliant with PAP treatment, not only for insurance purposes but primarily to improve Her symptoms, and for the patient's long term health benefit, including to reduce Her cardiovascular risks. I answered all her questions today and the patient was in agreement. I would like to see her back after the sleep study is completed and encouraged her to call with any interim questions, concerns, problems or updates.   Thank you very much for allowing me to participate in the care of this nice patient. If I can be of any further assistance to you please do not hesitate to call me at [phone].  Sincerely,   Angela Age, MD, PhD

## 2015-12-31 ENCOUNTER — Other Ambulatory Visit: Payer: Self-pay | Admitting: Family Medicine

## 2016-01-05 ENCOUNTER — Other Ambulatory Visit: Payer: Self-pay

## 2016-01-05 MED ORDER — OXYCODONE HCL ER 40 MG PO T12A: 40.0000 mg | EXTENDED_RELEASE_TABLET | Freq: Two times a day (BID) | ORAL | 0 refills | Status: DC

## 2016-01-08 DIAGNOSIS — J45909 Unspecified asthma, uncomplicated: Secondary | ICD-10-CM | POA: Diagnosis not present

## 2016-01-10 ENCOUNTER — Other Ambulatory Visit: Payer: Self-pay | Admitting: Family Medicine

## 2016-01-10 DIAGNOSIS — M62830 Muscle spasm of back: Secondary | ICD-10-CM

## 2016-02-02 ENCOUNTER — Ambulatory Visit (INDEPENDENT_AMBULATORY_CARE_PROVIDER_SITE_OTHER): Payer: Medicare Other | Admitting: Obstetrics and Gynecology

## 2016-02-02 ENCOUNTER — Encounter: Payer: Self-pay | Admitting: Obstetrics and Gynecology

## 2016-02-02 VITALS — BP 176/98 | HR 80 | Ht 64.0 in | Wt 319.2 lb

## 2016-02-02 DIAGNOSIS — Z6841 Body Mass Index (BMI) 40.0 and over, adult: Secondary | ICD-10-CM | POA: Diagnosis not present

## 2016-02-02 DIAGNOSIS — N393 Stress incontinence (female) (male): Secondary | ICD-10-CM

## 2016-02-02 NOTE — Progress Notes (Signed)
[name], female   DOB: 03/04/1961, 55 y.o.   MRN: [id number]    [facility] Visit  @DATE @            Patient name: Angela [name] MRN [id number]  Date of birth: [birth year]  CC & HPI:   Chief Complaint  Patient presents with   Urinary Incontinence   Mutliple medical problems, Morbid obesity diabetes mellitus hypertension stress incontinence, asthma  Angela [name] is a 55 y.o. female presenting today for urinary frequency and urgency, SUI. Pt complains that coughing worsens her symptoms. She denies any urinary incontinence. Pt states that she wears two pads in her underwear for protection. She states she urinates approximately twice in the night. Pt is currently taking a diuretic. She reports she was told by her surgeon her bladder was "bumped" during a prior surgical procedure and was told her bladder would likely weaken over time.  Exam does not find any bladder abnormalities  Pt states her overall health is managed by Dr. Moshe Cipro. She states she has been recommended knee replacement, but has not pursued the procedure. She is not considered good surgical candidate and is not eligible for gastric bypass due to history of 4, heart attacks  She states she walks daily. She also reports she tries to monitor her caloric intake and make healthy choices; however, she struggles keeping weight off as a result of the steroids she takes for asthma and chronic bronchitis. Some denial is present  ROS:  Review of Systems  Genitourinary: Positive for frequency and urgency.       +SUI    Pertinent History Reviewed:   Reviewed: Significant for CAD, HTN, severe obesity, DM2  Medical         Past Medical History:  Diagnosis Date   Anxiety    Arthritis    Asthma    Coronary atherosclerosis of native coronary artery    Mild nonobstructive CAD by cardiac catheterization 2008 - Dr. Terrence Dupont   Degenerative disc disease    Depression    Dysrhythmia    Essential  hypertension, benign    Folliculitis 0000000   GERD (gastroesophageal reflux disease)    H/O hiatal hernia    Helicobacter pylori gastritis 2008   Hypercholesterolemia    Hypertension    Hyperthyroidism    s/p radiation   Low back pain    Migraine    Nephrolithiasis    Recurring episodes since 2004   Severe obesity (BMI >= 40) (HCC)    Sleep apnea    STOP BANG SCORE 6no cpap used   Type 2 diabetes mellitus with diabetic neuropathy ([place])                               Surgical Hx:    Past Surgical History:  Procedure Laterality Date   [place]  2000 [facility]   INT HEMORRHOIDS/FISSURE   COLONOSCOPY  2003 [facility], CHANGE IN BOWEL HABITS   INT HEMORRHOIDS   COLONOSCOPY  2006 [facility]   INT HEMORRHOIDS   COLONOSCOPY  2007 [facility]   INT HEMORRHOIDS   CYSTOSCOPY WITH STENT PLACEMENT Left 07/25/2012   Procedure: CYSTOSCOPY, left retograde pyelogram WITH left ureteral  STENT PLACEMENT;  Surgeon: Claybon Jabs, MD;  Location: [facility];  Service: Urology;  Laterality: Left;   CYSTOSCOPY/RETROGRADE/URETEROSCOPY  12/04/2011   Procedure: CYSTOSCOPY/RETROGRADE/URETEROSCOPY;  Surgeon: Molli Hazard, MD;  Location: [facility];  Service: Urology;  Laterality: Right;   CYSTOSCOPY/RETROGRADE/URETEROSCOPY/STONE EXTRACTION WITH BASKET Left 09/03/2012   Procedure: CYSTOSCOPY/RETROGRADE pyelogram/digital URETEROSCOPY/STONE EXTRACTION WITH BASKET, left stent removal;  Surgeon: Molli Hazard, MD;  Location: [facility];  Service: Urology;  Laterality: Left;   ESOPHAGOGASTRODUODENOSCOPY  2008   [id number] hiatal hernia./Normal esophagus without evidence of Barrett's mass, stricture, erosion or ulceration./Normal duodenal bulb and second portion of the duodenum./Diffuse erythema in the body and the antrum with occasional erosion.   Biopsies obtained via cold forceps to evaluate for H. pylori gastritis   FRACTURE SURGERY  1999   right clavicle   HOLMIUM LASER APPLICATION Left [id number]   Procedure: HOLMIUM LASER APPLICATION;  Surgeon: Molli Hazard, MD;  Location: [facility];  Service: Urology;  Laterality: Left;   KNEE ARTHROSCOPY  10/04/2010,    right knee arthroscopy, dr Theda Sers   left knee arthroscopic surgery  1999   Left salphingectomy secondary to ectopic pregnancy  [place]   rotary cuff  Right 05/14/2014   [facility]   UPPER GASTROINTESTINAL ENDOSCOPY  2008 abd pain   H. pylori gastritis   UPPER GASTROINTESTINAL ENDOSCOPY  1996 [facility] [place]   PUD   Medications: Reviewed & Updated - see associated section                       Current Outpatient Prescriptions:    albuterol (PROAIR HFA) 108 (90 BASE) MCG/ACT inhaler, Inhale 2 puffs into the lungs every 6 (six) hours as needed for wheezing or shortness of breath., Disp: , Rfl:    amLODipine (NORVASC) 10 MG tablet, Take 1 tablet (10 mg total) by mouth daily., Disp: 30 tablet, Rfl: 5   atorvastatin (LIPITOR) 80 MG tablet, TAKE 1 TABLET BY MOUTH EVERY DAY, Disp: 90 tablet, Rfl: 1   azithromycin (ZITHROMAX) 250 MG tablet, Use as directed, Disp: 6 tablet, Rfl: 0   canagliflozin (INVOKANA) 300 MG TABS tablet, Take 300 mg by mouth daily before breakfast., Disp: 30 tablet, Rfl: 4   cloNIDine (CATAPRES) 0.3 MG tablet, TAKE ONE TABLET AT 9 PM EVERY NIGHT FOR HIGH BLOOD PRESSURE, Disp: 90 tablet, Rfl: 1   cyclobenzaprine (FLEXERIL) 10 MG tablet, TAKE 1 TABLET BY MOUTH 3 TIMES A DAY, Disp: 90 tablet, Rfl: 3   diazepam (VALIUM) 5 MG tablet, TAKE 1 TABLET BY MOUTH 3 TIMES A DAY, Disp: 90 tablet, Rfl: 2   ergocalciferol (VITAMIN D2) 50000 units capsule, Take 1 capsule (50,000 Units total) by mouth once a week. Takes on Monday, Disp: 12 capsule, Rfl: 1   esomeprazole (NEXIUM) 40 MG capsule, TAKE 1 CAPSULE (40 MG TOTAL) BY MOUTH  DAILY., Disp: 90 capsule, Rfl: 1   FLUoxetine (PROZAC) 40 MG capsule, TAKE ONE CAPSULE BY MOUTH EVERY DAY, Disp: 90 capsule, Rfl: 1   fluticasone (FLONASE) 50 MCG/ACT nasal spray, Place 2 sprays into both nostrils daily., Disp: 16 g, Rfl: 6   furosemide (LASIX) 40 MG tablet, TAKE 1 TABLET BY MOUTH TWICE A DAY, Disp: 180 tablet, Rfl: 1   gabapentin (NEURONTIN) 800 MG tablet, TAKE 1 TABLET BY MOUTH 4 TIMES A DAY, Disp: 360 tablet, Rfl: 1   ipratropium-albuterol (DUONEB) 0.5-2.5 (3) MG/3ML SOLN, Take 3 mLs by nebulization every 6 (six) hours as needed., Disp:  360 mL, Rfl: 1   KLOR-CON M20 20 MEQ tablet, TAKE 1 TABLET (20 MEQ TOTAL) BY MOUTH 2 (TWO) TIMES DAILY., Disp: 180 tablet, Rfl: 0   metFORMIN (GLUCOPHAGE) 1000 MG tablet, TAKE 1 TABLET BY MOUTH TWICE A DAY WITH FOOD, Disp: 180 tablet, Rfl: 1   montelukast (SINGULAIR) 10 MG tablet, Take 1 tablet (10 mg total) by mouth at bedtime., Disp: 30 tablet, Rfl: 3   NITROSTAT 0.4 MG SL tablet, PLACE 1 TABLET UNDER TONGUE IF NEEDED FOR CHEST PAIN..MAX OF 3 TIMES, Disp: , Rfl: 11   oxyCODONE (OXYCONTIN) 40 mg 12 hr tablet, Take 1 tablet (40 mg total) by mouth every 12 (twelve) hours., Disp: 60 tablet, Rfl: 0   potassium chloride SA (K-DUR,KLOR-CON) 20 MEQ tablet, Take 1 tablet (20 mEq total) by mouth 2 (two) times daily., Disp: 180 tablet, Rfl: 0   predniSONE (STERAPRED UNI-PAK 21 TAB) 5 MG (21) TBPK tablet, Use as directed, Disp: 21 tablet, Rfl: 0   PROAIR HFA 108 (90 BASE) MCG/ACT inhaler, INHALE 2 PUFFS BY MOUTH EVERY 6-8HRS AS NEEDED, Disp: 8.5 Inhaler, Rfl: 1   SYMBICORT 160-4.5 MCG/ACT inhaler, INHALE 2 PUUFS BY MOUTH TWICE DAILY, Disp: 10.2 Inhaler, Rfl: 2   tiZANidine (ZANAFLEX) 4 MG tablet, TAKE 1 TABLET (4 MG TOTAL) BY MOUTH 3 (THREE) TIMES DAILY., Disp: 90 tablet, Rfl: 4   triamcinolone cream (KENALOG) 0.1 %, Apply 1 application topically 2 (two) times daily., Disp: 60 g, Rfl: 0   triamterene-hydrochlorothiazide (MAXZIDE) 75-50 MG  tablet, TAKE 1 TABLET BY MOUTH DAILY., Disp: 90 tablet, Rfl: 1   zolpidem (AMBIEN) 10 MG tablet, TAKE 1 TABLET AT BEDTIME, Disp: 30 tablet, Rfl: 2   chlorpheniramine-HYDROcodone (TUSSIONEX PENNKINETIC ER) 10-8 MG/5ML SUER, Take 5 mLs by mouth at bedtime as needed for cough. (Patient not taking: Reported on 02/02/2016), Disp: 120 mL, Rfl: 0   Social History: Reviewed -  reports that she has never smoked. She has never used smokeless tobacco.  Objective Findings:  Vitals: Blood pressure (!) 176/98, pulse 80, height 5\' 4"  (1.626 m), weight (!) 319 lb 3.2 oz (144.8 kg).  Physical Examination: General appearance - alert, chronically ill, and in no distress, morbidly obese  Mental status - alert, oriented to person, place, and time Chronic cough present, GERD versus asthma Pelvic -  VULVA: normal appearing vulva with no masses, tenderness or lesions,  VAGINA: normal appearing vagina with normal color and discharge, no lesions, adequate support, no cystocele, immediate loss of urine with cough in horizontal position, positive Marshall test.  CERVIX: normal appearing cervix without discharge or lesions,   Discussion  1. Lengthy discussion about weight loss methods. Recommended lifestyle changes and healthy eating habits. Strongly encouraged pt to continue with her efforts. Recommended apps like [employer] as well as support groups. Emphasized the importance of adequate water intake. Strongly encouraged pt to consider water aerobics and arthritis classes.   2. Discussed weight loss vs. surgical options for SUI. Discussed success of weight loss alone. Also advised pt that pessary may be beneficial. Discussed with pt that surgery is not my recommendation in her current health state. Offered to attempt pessary fitting  At end of discussion, pt had opportunity to ask questions and has no further questions at this time.   Greater than 50% was spent in counseling and coordination of care with the patient.  Total time greater than: 45 minutes   Assessment & Plan:   A:  1. DM2, severe obesity, extensive medication and problem  list   2. SUI; Positive Marshall test  3. Discussion of SUI tx (see above)  4. Lengthy discussion about health and weight loss (see above)   P:  1.Offered visit for F/u for pessary fitting next week  2. Patient's relapse came to for health improvement is weight loss of at least 100 pounds. Patient sounds somewhat concerned and may be able to be encouraged into improve activity but scribe take multiple support activities and systems   By signing my name below, I, Hansel Feinstein, attest that this documentation has been prepared under the direction and in the presence of Jonnie Kind, MD. Electronically Signed: Hansel Feinstein, ED Scribe. 02/02/16. 12:19 PM.  I personally performed the services described in this documentation, which was SCRIBED in my presence. The recorded information has been reviewed and considered accurate. It has been edited as necessary during review. Jonnie Kind, MD

## 2016-02-03 ENCOUNTER — Ambulatory Visit (INDEPENDENT_AMBULATORY_CARE_PROVIDER_SITE_OTHER): Payer: Medicare Other | Admitting: Neurology

## 2016-02-03 ENCOUNTER — Ambulatory Visit: Payer: Medicare Other | Admitting: Podiatry

## 2016-02-03 DIAGNOSIS — G4733 Obstructive sleep apnea (adult) (pediatric): Secondary | ICD-10-CM | POA: Diagnosis not present

## 2016-02-03 DIAGNOSIS — G4720 Circadian rhythm sleep disorder, unspecified type: Secondary | ICD-10-CM

## 2016-02-07 DIAGNOSIS — J45909 Unspecified asthma, uncomplicated: Secondary | ICD-10-CM | POA: Diagnosis not present

## 2016-02-08 ENCOUNTER — Other Ambulatory Visit: Payer: Self-pay | Admitting: Family Medicine

## 2016-02-11 ENCOUNTER — Other Ambulatory Visit: Payer: Self-pay

## 2016-02-11 MED ORDER — OXYCODONE HCL ER 40 MG PO T12A: 40.0000 mg | EXTENDED_RELEASE_TABLET | Freq: Two times a day (BID) | ORAL | 0 refills | Status: DC

## 2016-02-14 ENCOUNTER — Ambulatory Visit (INDEPENDENT_AMBULATORY_CARE_PROVIDER_SITE_OTHER): Payer: Medicare Other | Admitting: Podiatry

## 2016-02-14 ENCOUNTER — Encounter: Payer: Self-pay | Admitting: Podiatry

## 2016-02-14 VITALS — BP 163/99 | HR 71 | Resp 18

## 2016-02-14 DIAGNOSIS — Q828 Other specified congenital malformations of skin: Secondary | ICD-10-CM

## 2016-02-14 DIAGNOSIS — M79672 Pain in left foot: Secondary | ICD-10-CM

## 2016-02-14 DIAGNOSIS — L603 Nail dystrophy: Secondary | ICD-10-CM

## 2016-02-14 DIAGNOSIS — M79671 Pain in right foot: Secondary | ICD-10-CM | POA: Diagnosis not present

## 2016-02-14 DIAGNOSIS — M79676 Pain in unspecified toe(s): Secondary | ICD-10-CM

## 2016-02-14 DIAGNOSIS — B351 Tinea unguium: Secondary | ICD-10-CM | POA: Diagnosis not present

## 2016-02-14 DIAGNOSIS — L608 Other nail disorders: Secondary | ICD-10-CM | POA: Diagnosis not present

## 2016-02-14 DIAGNOSIS — L851 Acquired keratosis [keratoderma] palmaris et plantaris: Secondary | ICD-10-CM | POA: Diagnosis not present

## 2016-02-14 DIAGNOSIS — L84 Corns and callosities: Secondary | ICD-10-CM

## 2016-02-14 DIAGNOSIS — E0843 Diabetes mellitus due to underlying condition with diabetic autonomic (poly)neuropathy: Secondary | ICD-10-CM | POA: Diagnosis not present

## 2016-02-14 NOTE — Progress Notes (Signed)
   Subjective:    [name], female    DOB: Mar 26, 1961, 55 y.o.   MRN: [id number]  HPI    Review of Systems  Skin: Positive for rash and wound.  Allergic/Immunologic: Positive for environmental allergies.  All other systems reviewed and are negative.      Objective:   Physical Exam        Assessment & Plan:

## 2016-02-15 ENCOUNTER — Telehealth: Payer: Self-pay | Admitting: Neurology

## 2016-02-15 ENCOUNTER — Other Ambulatory Visit: Payer: Self-pay | Admitting: Family Medicine

## 2016-02-15 DIAGNOSIS — G4733 Obstructive sleep apnea (adult) (pediatric): Secondary | ICD-10-CM

## 2016-02-15 NOTE — Telephone Encounter (Signed)
LM for patient to call back for results

## 2016-02-15 NOTE — Telephone Encounter (Signed)
Diana:  Patient referred by Dr. Moshe Cipro, seen by me on 12/29/15, split study on 02/03/16. Please call and notify patient that the recent sleep study confirmed the diagnosis of severe OSA. She did fairly well with CPAP during the study with improvement of the respiratory events. Therefore, I would like start the patient on CPAP therapy at home by prescribing a machine for home use. I placed the order in the chart. The patient will need a follow up appointment with me in 8 to 10 weeks post set up that has to be scheduled; please go ahead and schedule while you have the patient on the phone and make sure patient understands the importance of keeping this window for the FU appointment, as it is often an insurance requirement and failing to adhere to this may result in losing coverage for sleep apnea treatment.  Please re-enforce the importance of compliance with treatment and the need for [place] to monitor compliance data - again an insurance requirement and good feedback for the patient as far as how they are doing.  Also remind patient, that any upcoming CPAP machine or mask issues, should be first addressed with the DME company. Please ask if patient has a preference regarding DME company.  Please arrange for CPAP set up at home through a DME company of patient's choice - once you have spoken to the patient - and faxed/routed report to PCP and referring MD (if other than PCP), you can close this encounter, thanks,   Star Age, MD, PhD [facility] ([place])

## 2016-02-15 NOTE — Telephone Encounter (Signed)
Patient returned your call about sleep study results please return her call on mobile number [id number]

## 2016-02-15 NOTE — Progress Notes (Signed)
PATIENT'S NAME:  Angela [name], Angela [name] DOB:      November 27, 1960      MR#:    [id number]     DATE OF RECORDING: 02/03/2016 REFERRING M.D.:  [name], MD Study Performed:  Split-Night Titration Study HISTORY:  55 year old woman with an underlying medical history of recurrent vertigo, allergic rhinitis, asthma, hypertension, chronic back pain, s/p 4 back surgeries, on chronic narcotic pain medication, and morbid obesity, who reports snoring and excessive daytime somnolence. The patient endorsed the Epworth Sleepiness Scale at 7/24 points and the Fatigue Score at 29 points. The patient's weight 310 pounds with a height of 64 (inches), resulting in a BMI of 53.1 kg/m2. The patient's neck circumference measured 16.5 inches.  CURRENT MEDICATIONS: Albuterol, Amlodipine, Atorvastatin, Azithromycin, Canagliflozin, Cholorpheniramine, Clonidine, Cyclobenzaprine, Diazepam, Ergocalciferol, Esomeprazole, Fluoxetine, Fluticasone, Furosemide, Gabapentin, Ipratropiom, Klor-Con, Metfromin,    PROCEDURE:  This is a multichannel digital polysomnogram utilizing the Somnostar 11.2 system.  Electrodes and sensors were applied and monitored per AASM Specifications.   EEG, EOG, Chin and Limb EMG, were sampled at 200 Hz.  ECG, Snore and Nasal Pressure, Thermal Airflow, Respiratory Effort, CPAP Flow and Pressure, Oximetry was sampled at 50 Hz. Digital video and audio were recorded.      BASELINE STUDY WITHOUT CPAP RESULTS:  Lights Out was at 22:33.  Total recording time (TRT) was 177.5, with a total sleep time (TST) of 127.5 minutes.   The patient's sleep latency was 40.5 minutes, which is prolonged.  REM was absent. The sleep efficiency was 71.8 %.    SLEEP ARCHITECTURE: Wake after sleep onset was 57 minutes with moderate sleep fragmentation noted.  Stage N1 was 7.8%, Stage N2 92.2%, which was markedly increased and there was absence of Stage N3 and Stage R (REM sleep).   RESPIRATORY ANALYSIS:  There were a total of 78  respiratory events:  6 obstructive apneas, 0 central apneas and0 mixed apneas with a total of 6 apneas and an apnea index (AI) of 2.8. There were 72 hypopneas with a hypopnea index of 33.9. The patient also had 0 respiratory event related arousals (RERAs).      The total APNEA/HYPOPNEA INDEX (AHI) was 36.7 and the total RESPIRATORY DISTURBANCE INDEX was 36.7.  0 events occurred in REM sleep and 144 events in NREM. The REM AHI was 0, versus a non-REM AHI of 36.7. The patient spent 32% of total sleep time in the supine position. The supine AHI was 22.0 versus a non-supine AHI of 43.7.  OXYGEN SATURATION & C02:  The baseline 02 saturation was 97%, with the lowest being 86%. Time spent below 89% saturation equaled 4 minutes.   PERIODIC LIMB MOVEMENTS: (Baseline)   The patient had a total of 0 Periodic Limb Movements.  The Periodic Limb Movement (PLM) index was 0 and the PLM Arousal index was 0.   TITRATION STUDY WITH CPAP RESULTS:   Treatment was started at 01:30 AM. CPAP was initiated at 5 cmH20 with heated humidity per AASM split night standards and pressure was advanced to 10 cmH20 because of hypopneas, apneas and desaturations and snoring.  At a PAP pressure of 10 cmH20, there was a reduction of the AHI to 0 per hour, but absence of REM sleep.     Lights Out was at 01:30 and Lights On at 04:47. Total recording time (TRT) was 197.5 minutes, with a total sleep time (TST) of 182.5 minutes. The patient's sleep latency was 15 minutes with 11.5 minutes of wake time after sleep onset.  REM was absent. The sleep efficiency was 92.4 %.    SLEEP ARCHITECTURE: Stage N1 was 4.9%, Stage N2 95.1%, which was markedly increased and there was absence of Stage N3 and Stage R (REM sleep).  RESPIRATORY ANALYSIS:  There were a total of 9 respiratory events: 0 obstructive apneas, 0 central apneas and 0 mixed apneas with a total of 0 apneas and an apnea index (AI) of 0. There were 9 hypopneas with a hypopnea index of 3..  The patient also had 0 respiratory event related arousals (RERAs).      The total APNEA/HYPOPNEA INDEX  (AHI) was 3. and the total RESPIRATORY DISTURBANCE INDEX was 3..  0 events occurred in REM sleep and 9 events in NREM. The REM AHI was 0, versus a non-REM AHI of 3.. The patient spent 0% of total sleep time in the supine position. The supine AHI was 0.0, versus a non-supine AHI of 3.0.  OXYGEN SATURATION & C02:  The baseline 02 saturation was 90%, with the lowest being 87%. Time spent below 89% saturation equaled 3 minutes.  Average End Tidal CO2 during sleep was 1 torr. The minium End Tidal CO2 during sleep was 0 torr which increased to a maximum of 1  torr during NREM and 0 torr during REM. The duration of sleep time spent at >50 torr was 0.   PERIODIC LIMB MOVEMENTS: (post-CPAP)   The patient had a total of 13 Periodic Limb Movements. The Periodic Limb Movement (PLM) index was 4.3 and the PLM Arousal index was 1.    POLYSOMNOGRAPHY IMPRESSION :  1. Obstructive sleep apnea  2.  Dysfunctions associated with sleep stages or arousal from sleep  RECOMMENDATIONS: 1.  This study demonstrates severe obstructive sleep apnea, with a total AHI of 36.7/hour, and O2 nadir of 86%. Please note that the AHI and O2 nadir during the baseline study may represent an underestimation of the patient's sleep-disordered breathing since no REM sleep was achieved during this study. Most patients with obstructive sleep apnea have the most frequent and significant apneic or hypopneic events and more significant desaturations during REM sleep, particularly while in supine REM sleep. The patient did reasonably well on CPAP of 10 cm. REM sleep was not achieved during the treatment portion of the study and a higher treatment pressure may be necessary to treat her REM related OSA. I will prescribe home CPAP therapy at a pressure of 10 cm and will follow her clinically and her downloads. Other treatment options for OSA may  include avoidance of supine sleep position along with weight loss, upper airway or jaw surgery in selected patients or the use of an oral appliance in certain patients. ENT evaluation and/or consultation with a maxillofacial surgeon or dentist may be feasible and some instances.    2. The patient should be reminded to be fully compliant with PAP therapy to improve sleep related symptoms and decrease long term cardiovascular risks. Please note that untreated obstructive sleep apnea carries additional perioperative morbidity. Patients with significant obstructive sleep apnea should receive perioperative PAP therapy and the surgeons and particularly the anesthesiologist should be informed of the diagnosis and the severity of the sleep disordered breathing. 3. The patient should be cautioned not to drive, work at heights, or operate dangerous or heavy equipment when tired or sleepy. Review and reiteration of good sleep hygiene measures should be pursued with any patient. 4. This study shows sleep fragmentation and abnormal sleep stage percentages; these are nonspecific findings and per se  do not signify an intrinsic sleep disorder or a cause for the patient's sleep-related symptoms. Causes include (but are not limited to) the first night effect of the sleep study, circadian rhythm disturbances, medication effect or an underlying mood disorder or medical problem.  5.  A follow up appointment will be scheduled in the Sleep Clinic at [name]. Her referring provider will be notified of the results.   I certify that I have reviewed the entire raw data recording prior to the issuance of this report in accordance with the Standards of Accreditation of the American Academy of Sleep Medicine (AASM)       Star Age, MD, PhD Diplomat, American Board of Psychiatry and Neurology  Diplomat, [place] [employer]

## 2016-02-16 NOTE — Telephone Encounter (Signed)
See other note for further

## 2016-02-16 NOTE — Telephone Encounter (Signed)
LM (per DPR) with results of sleep study and recommendations. I asked patient to call me back and let me know if she would like to go ahead and start CPAP? Also call back if any further questions.

## 2016-02-20 NOTE — Progress Notes (Signed)
[name], female   DOB: February 17, 1961, 55 y.o.   MRN: [id number] SUBJECTIVE Patient with a history of diabetes mellitus presents to office today complaining of elongated, thickened nails. Pain while ambulating in shoes. Patient is unable to trim their own nails.   Allergies  Allergen Reactions  . Morphine Other (See Comments)    Reaction with esophagus, unable to swallow.   . Ace Inhibitors Cough  . Aspirin Other (See Comments)    Reaction with esophagus, unable to swallow.   . Losartan Cough  . Tomato Other (See Comments)    Acid reflux due to acid in tomato  . Adhesive [Tape] Rash    OBJECTIVE General Patient is awake, alert, and oriented x 3 and in no acute distress. Derm Hyperkeratotic callus lesions noted to the bilateral fifth digits painful on palpation. Skin is dry and supple bilateral. Negative open lesions or macerations. Remaining integument unremarkable. Nails are tender, long, thickened and dystrophic with subungual debris, consistent with onychomycosis, 1-5 bilateral. No signs of infection noted. Vasc  DP and PT pedal pulses palpable bilaterally. Temperature gradient within normal limits.  Neuro Epicritic and protective threshold sensation diminished bilaterally.  Musculoskeletal Exam No symptomatic pedal deformities noted bilateral. Muscular strength within normal limits.  ASSESSMENT 1. Diabetes Mellitus w/ peripheral neuropathy 2. Onychomycosis of nail due to dermatophyte bilateral 3. Pain in foot bilateral 4. Painful callus lesions bilateral fifth digits   PLAN OF CARE 1. Patient evaluated today. 2. Instructed to maintain good pedal hygiene and foot care. Stressed importance of controlling blood sugar.  3. Mechanical debridement of nails 1-5 bilaterally performed using a nail nipper. Filed with dremel without incident.  4. Excisional debridement of painful callus lesions performed using a chisel blade to the bilateral fifth digits. 5. Return to clinic  in 3 mos.    Edrick Kins, DPM

## 2016-02-23 ENCOUNTER — Encounter: Payer: Medicare Other | Admitting: Obstetrics and Gynecology

## 2016-02-24 NOTE — Telephone Encounter (Signed)
I called patient back and she is aware of results and recommendations. She is willing to start treatment and requested that orders be sent to [facility]. I have faxed today. Report sent to PCP. Patient will get a letter reminding her to make f/u appt and stress the importance of compliance.

## 2016-02-28 ENCOUNTER — Other Ambulatory Visit: Payer: Self-pay | Admitting: Family Medicine

## 2016-02-28 ENCOUNTER — Telehealth: Payer: Self-pay

## 2016-02-28 MED ORDER — FLUCONAZOLE 150 MG PO TABS: ORAL_TABLET | ORAL | 0 refills | Status: DC

## 2016-02-28 NOTE — Telephone Encounter (Signed)
Sent , remind her to keep appt please

## 2016-03-06 ENCOUNTER — Other Ambulatory Visit: Payer: Self-pay | Admitting: Family Medicine

## 2016-03-06 DIAGNOSIS — I10 Essential (primary) hypertension: Secondary | ICD-10-CM

## 2016-03-07 ENCOUNTER — Other Ambulatory Visit: Payer: Self-pay

## 2016-03-07 DIAGNOSIS — H40013 Open angle with borderline findings, low risk, bilateral: Secondary | ICD-10-CM | POA: Diagnosis not present

## 2016-03-07 DIAGNOSIS — E119 Type 2 diabetes mellitus without complications: Secondary | ICD-10-CM | POA: Diagnosis not present

## 2016-03-07 LAB — HM DIABETES EYE EXAM

## 2016-03-07 MED ORDER — BUDESONIDE-FORMOTEROL FUMARATE 160-4.5 MCG/ACT IN AERO: INHALATION_SPRAY | RESPIRATORY_TRACT | 2 refills | Status: DC

## 2016-03-09 ENCOUNTER — Ambulatory Visit (INDEPENDENT_AMBULATORY_CARE_PROVIDER_SITE_OTHER): Payer: Medicare Other | Admitting: Family Medicine

## 2016-03-09 ENCOUNTER — Encounter: Payer: Self-pay | Admitting: Family Medicine

## 2016-03-09 VITALS — BP 140/84 | HR 98 | Resp 16 | Ht 64.0 in | Wt 295.0 lb

## 2016-03-09 DIAGNOSIS — E119 Type 2 diabetes mellitus without complications: Secondary | ICD-10-CM | POA: Diagnosis not present

## 2016-03-09 DIAGNOSIS — J45909 Unspecified asthma, uncomplicated: Secondary | ICD-10-CM | POA: Diagnosis not present

## 2016-03-09 DIAGNOSIS — E785 Hyperlipidemia, unspecified: Secondary | ICD-10-CM

## 2016-03-09 DIAGNOSIS — M25561 Pain in right knee: Secondary | ICD-10-CM

## 2016-03-09 DIAGNOSIS — M5440 Lumbago with sciatica, unspecified side: Secondary | ICD-10-CM

## 2016-03-09 DIAGNOSIS — Z23 Encounter for immunization: Secondary | ICD-10-CM | POA: Diagnosis not present

## 2016-03-09 DIAGNOSIS — I10 Essential (primary) hypertension: Secondary | ICD-10-CM

## 2016-03-09 DIAGNOSIS — R21 Rash and other nonspecific skin eruption: Secondary | ICD-10-CM

## 2016-03-09 DIAGNOSIS — J301 Allergic rhinitis due to pollen: Secondary | ICD-10-CM

## 2016-03-09 MED ORDER — KETOROLAC TROMETHAMINE 60 MG/2ML IM SOLN
60.0000 mg | Freq: Once | INTRAMUSCULAR | Status: AC
  Administered 2016-03-09: 60 mg via INTRAMUSCULAR

## 2016-03-09 MED ORDER — PREDNISONE 5 MG (21) PO TBPK: 5.0000 mg | ORAL_TABLET | ORAL | 0 refills | Status: DC

## 2016-03-09 MED ORDER — METHYLPREDNISOLONE ACETATE 80 MG/ML IJ SUSP
80.0000 mg | Freq: Once | INTRAMUSCULAR | Status: AC
  Administered 2016-03-09: 80 mg via INTRAMUSCULAR

## 2016-03-09 NOTE — Patient Instructions (Signed)
Annual physical exam reschedule to last week in December, ca;ll if you need me sooner  Toradol and DepoMedrol in office today and prednisone dose pack prescribed for right knee pain  CONGRATS on weight loss, improved blood pressure and please DO use the CPAP machine every night  YOU are ON THE WAY to better health!  Fasting labs 1 week before next visit  Flu vaccine today

## 2016-03-09 NOTE — Assessment & Plan Note (Signed)
Uncontrolled.Toradol and depo medrol administered IM in the office , to be followed by a short course of oral prednisone and NSAIDS.  

## 2016-03-10 ENCOUNTER — Other Ambulatory Visit: Payer: Self-pay

## 2016-03-10 MED ORDER — OXYCODONE HCL ER 40 MG PO T12A: 40.0000 mg | EXTENDED_RELEASE_TABLET | Freq: Two times a day (BID) | ORAL | 0 refills | Status: DC

## 2016-03-12 ENCOUNTER — Encounter: Payer: Self-pay | Admitting: Family Medicine

## 2016-03-12 NOTE — Assessment & Plan Note (Signed)
Being ,managed by dermatology, present on right anterior leg, improving slowly

## 2016-03-12 NOTE — Assessment & Plan Note (Signed)
Angela [name] is reminded of the importance of commitment to daily physical activity for 30 minutes or more, as able and the need to limit carbohydrate intake to 30 to 60 grams per meal to help with blood sugar control.   The need to take medication as prescribed, test blood sugar as directed, and to call between visits if there is a concern that blood sugar is uncontrolled is also discussed.   Angela [name] is reminded of the importance of daily foot exam, annual eye examination, and good blood sugar, blood pressure and cholesterol control. Updated lab needed at/ before next visit.   Diabetic Labs Latest Ref Rng & Units 07/27/2015 10/14/2014 08/31/2014 12/19/2013 08/26/2013  HbA1c <5.7 % 6.6(H) - 7.1(H) 6.7(H) 6.8(H)  Microalbumin <2.0 mg/dL - - 1.1 - 1.76  Micro/Creat Ratio 0.0 - 30.0 mg/g - - 7.0 - 6.8  Chol 125 - 200 mg/dL 145 - 173 159 -  HDL >=46 mg/dL 48 - 44(L) 46 -  Calc LDL <130 mg/dL 81 - 113(H) 95 -  Triglycerides <150 mg/dL 80 - 82 88 -  Creatinine 0.50 - 1.05 mg/dL 0.75 0.84 0.78 0.69 0.69   BP/Weight 03/09/2016 02/14/2016 02/02/2016 12/29/2015 12/03/2015 10/26/2015 Q000111Q  Systolic BP XX123456 XX123456 0000000 Q000111Q 123456 0000000 0000000  Diastolic BP 84 99 98 92 80 72 100  Wt. (Lbs) 295 - 319.2 310 306 301 310  BMI 50.64 - 54.79 53.21 52.52 51.64 53.19   Foot/eye exam completion dates Latest Ref Rng & Units 03/07/2016 09/14/2015  Eye Exam No Retinopathy No Retinopathy -  Foot Form Completion - - Done

## 2016-03-12 NOTE — Assessment & Plan Note (Signed)
Uncontrolled.Toradol and depo medrol administered IM in the office , to be followed by a short course of oral prednisone   

## 2016-03-12 NOTE — Assessment & Plan Note (Signed)
Improved. Pt applauded on succesful weight loss through lifestyle change, and encouraged to continue same. Weight loss goal set for the next several months.  

## 2016-03-12 NOTE — Assessment & Plan Note (Signed)
Hyperlipidemia:Low fat diet discussed and encouraged.   Lipid Panel  Lab Results  Component Value Date   CHOL 145 07/27/2015   HDL 48 07/27/2015   LDLCALC 81 07/27/2015   TRIG 80 07/27/2015   CHOLHDL 3.0 07/27/2015   Updated lab needed at/ before next visit.

## 2016-03-12 NOTE — Assessment & Plan Note (Signed)
Controlled, no change in medication  

## 2016-03-12 NOTE — Progress Notes (Signed)
Angela Angela [name]     MRN: [id number]      DOB: May 14, 1960   HPI Angela Angela [name] is here for follow up and re-evaluation of chronic medical conditions, medication management and review of any available recent lab and radiology data.  Preventive health is updated, specifically  Cancer screening and Immunization.   Questions or concerns regarding consultations or procedures which Angela Angela [name] has had in Angela interim are  Addressed.Had sleep study and is to collect CPAP machine Angela Angela [name] denies any adverse reactions to current medications since Angela last visit.  1 week h/o increased right knee pain , swelling and instability,no inciting trauma Weight loss through dietary change is very encouraging and Angela Angela [name] family is supportive  ROS Denies recent fever or chills. Denies sinus pressure, nasal congestion, ear pain or sore throat. Denies chest congestion, productive cough or wheezing. Denies chest pains, palpitations and leg swelling Denies abdominal pain, nausea, vomiting,diarrhea or constipation.   Denies dysuria, frequency, hesitancy or incontinence. Denies headaches, seizures, numbness, or tingling. Denies depression, anxiety or insomnia. Denies skin break down or rash.   PE  BP 140/84   Pulse 98   Resp 16   Ht 5\' 4"  (1.626 m)   Wt 295 lb (133.8 kg)   SpO2 96%   BMI 50.64 kg/m   Patient alert and oriented and in no cardiopulmonary distress.Angela [name] in pain HEENT: No facial asymmetry, EOMI,   oropharynx pink and moist.  Neck supple no JVD, no mass.  Chest: Clear to auscultation bilaterally.  CVS: S1, S2 no murmurs, no S3.Regular rate.  ABD: Soft non tender.   Ext: No edema  MS: Decreased  ROM spine normal in  Shoulders and  hips and markedly reduced in right knee which is deformed and tender with crepitus.  Skin: Intact, no ulcerations or rash noted.  Psych: Good eye contact, normal affect. Memory intact not anxious or depressed appearing.  CNS: CN 2-12 intact, power,  normal throughout.no focal  deficits noted.   Assessment & Plan  Knee pain, right Uncontrolled.Toradol and depo medrol administered IM in Angela office , to be followed by a short course of oral prednisone and NSAIDS.   Back pain Uncontrolled.Toradol and depo medrol administered IM in Angela office , to be followed by a short course of oral prednisone .   Essential hypertension Controlled, no change in medication DASH diet and commitment to daily physical activity for a minimum of 30 minutes discussed and encouraged, as a part of hypertension management. Angela importance of attaining a healthy weight is also discussed.  BP/Weight 03/09/2016 02/14/2016 02/02/2016 12/29/2015 12/03/2015 10/26/2015 Q000111Q  Systolic BP XX123456 XX123456 0000000 Q000111Q 123456 0000000 0000000  Diastolic BP 84 99 98 92 80 72 100  Wt. (Lbs) 295 - 319.2 310 306 301 310  BMI 50.64 - 54.79 53.21 52.52 51.64 53.19       Morbid obesity Improved. Angela [name] applauded on succesful weight loss through lifestyle change, and encouraged to continue same. Weight loss goal set for Angela next several months.   Diabetes mellitus without complication Angela Angela [name] is reminded of Angela importance of commitment to daily physical activity for 30 minutes or more, as able and Angela need to limit carbohydrate intake to 30 to 60 grams per meal to help with blood sugar control.   Angela need to take medication as prescribed, test blood sugar as directed, and to call between visits if there is a concern that blood sugar is uncontrolled is also discussed.   Angela Angela [name]  is reminded of Angela importance of daily foot exam, annual eye examination, and good blood sugar, blood pressure and cholesterol control. Updated lab needed at/ before next visit.   Diabetic Labs Latest Ref Rng & Units 07/27/2015 10/14/2014 08/31/2014 12/19/2013 08/26/2013  HbA1c <5.7 % 6.6(H) - 7.1(H) 6.7(H) 6.8(H)  Microalbumin <2.0 mg/dL - - 1.1 - 1.76  Micro/Creat Ratio 0.0 - 30.0 mg/g - - 7.0 - 6.8  Chol 125 - 200 mg/dL 145 - 173 159 -  HDL >=46  mg/dL 48 - 44(L) 46 -  Calc LDL <130 mg/dL 81 - 113(H) 95 -  Triglycerides <150 mg/dL 80 - 82 88 -  Creatinine 0.50 - 1.05 mg/dL 0.75 0.84 0.78 0.69 0.69   BP/Weight 03/09/2016 02/14/2016 02/02/2016 12/29/2015 12/03/2015 10/26/2015 Q000111Q  Systolic BP XX123456 XX123456 0000000 Q000111Q 123456 0000000 0000000  Diastolic BP 84 99 98 92 80 72 100  Wt. (Lbs) 295 - 319.2 310 306 301 310  BMI 50.64 - 54.79 53.21 52.52 51.64 53.19   Foot/eye exam completion dates Latest Ref Rng & Units 03/07/2016 09/14/2015  Eye Exam No Retinopathy No Retinopathy -  Foot Form Completion - - Done        Allergic rhinitis Controlled, no change in medication   Rash and nonspecific skin eruption Being ,managed by dermatology, present on right anterior leg, improving slowly  Hyperlipidemia LDL goal <100 Hyperlipidemia:Low fat diet discussed and encouraged.   Lipid Panel  Lab Results  Component Value Date   CHOL 145 07/27/2015   HDL 48 07/27/2015   LDLCALC 81 07/27/2015   TRIG 80 07/27/2015   CHOLHDL 3.0 07/27/2015   Updated lab needed at/ before next visit.

## 2016-03-12 NOTE — Assessment & Plan Note (Signed)
Controlled, no change in medication DASH diet and commitment to daily physical activity for a minimum of 30 minutes discussed and encouraged, as a part of hypertension management. The importance of attaining a healthy weight is also discussed.  BP/Weight 03/09/2016 02/14/2016 02/02/2016 12/29/2015 12/03/2015 10/26/2015 Q000111Q  Systolic BP XX123456 XX123456 0000000 Q000111Q 123456 0000000 0000000  Diastolic BP 84 99 98 92 80 72 100  Wt. (Lbs) 295 - 319.2 310 306 301 310  BMI 50.64 - 54.79 53.21 52.52 51.64 53.19

## 2016-03-15 ENCOUNTER — Other Ambulatory Visit: Payer: Self-pay | Admitting: Family Medicine

## 2016-03-28 DIAGNOSIS — M1711 Unilateral primary osteoarthritis, right knee: Secondary | ICD-10-CM | POA: Diagnosis not present

## 2016-04-01 ENCOUNTER — Other Ambulatory Visit: Payer: Self-pay | Admitting: Family Medicine

## 2016-04-05 ENCOUNTER — Other Ambulatory Visit: Payer: Self-pay | Admitting: Family Medicine

## 2016-04-07 ENCOUNTER — Other Ambulatory Visit: Payer: Self-pay

## 2016-04-07 MED ORDER — OXYCODONE HCL ER 40 MG PO T12A: 40.0000 mg | EXTENDED_RELEASE_TABLET | Freq: Two times a day (BID) | ORAL | 0 refills | Status: DC

## 2016-04-08 DIAGNOSIS — J45909 Unspecified asthma, uncomplicated: Secondary | ICD-10-CM | POA: Diagnosis not present

## 2016-04-12 ENCOUNTER — Other Ambulatory Visit: Payer: Self-pay

## 2016-04-12 MED ORDER — OXYCODONE HCL ER 40 MG PO T12A: 40.0000 mg | EXTENDED_RELEASE_TABLET | Freq: Two times a day (BID) | ORAL | 0 refills | Status: DC

## 2016-04-19 DIAGNOSIS — E119 Type 2 diabetes mellitus without complications: Secondary | ICD-10-CM | POA: Diagnosis not present

## 2016-04-19 DIAGNOSIS — E785 Hyperlipidemia, unspecified: Secondary | ICD-10-CM | POA: Diagnosis not present

## 2016-04-19 DIAGNOSIS — I10 Essential (primary) hypertension: Secondary | ICD-10-CM | POA: Diagnosis not present

## 2016-04-19 LAB — CBC
HCT: 38.3 % (ref 35.0–45.0)
Hemoglobin: 12.2 g/dL (ref 11.7–15.5)
MCH: 26.2 pg — AB (ref 27.0–33.0)
MCHC: 31.9 g/dL — ABNORMAL LOW (ref 32.0–36.0)
MCV: 82.2 fL (ref 80.0–100.0)
MPV: 9 fL (ref 7.5–12.5)
PLATELETS: 395 10*3/uL (ref 140–400)
RBC: 4.66 MIL/uL (ref 3.80–5.10)
RDW: 14.6 % (ref 11.0–15.0)
WBC: 5.5 10*3/uL (ref 3.8–10.8)

## 2016-04-20 ENCOUNTER — Ambulatory Visit (INDEPENDENT_AMBULATORY_CARE_PROVIDER_SITE_OTHER): Payer: Medicare Other | Admitting: Family Medicine

## 2016-04-20 ENCOUNTER — Encounter: Payer: Self-pay | Admitting: Family Medicine

## 2016-04-20 VITALS — BP 160/84 | HR 91 | Resp 16 | Ht 64.0 in | Wt 315.0 lb

## 2016-04-20 DIAGNOSIS — E119 Type 2 diabetes mellitus without complications: Secondary | ICD-10-CM

## 2016-04-20 DIAGNOSIS — G8929 Other chronic pain: Secondary | ICD-10-CM

## 2016-04-20 DIAGNOSIS — F418 Other specified anxiety disorders: Secondary | ICD-10-CM

## 2016-04-20 DIAGNOSIS — M25561 Pain in right knee: Secondary | ICD-10-CM

## 2016-04-20 DIAGNOSIS — M5440 Lumbago with sciatica, unspecified side: Secondary | ICD-10-CM | POA: Diagnosis not present

## 2016-04-20 DIAGNOSIS — I10 Essential (primary) hypertension: Secondary | ICD-10-CM

## 2016-04-20 DIAGNOSIS — E785 Hyperlipidemia, unspecified: Secondary | ICD-10-CM

## 2016-04-20 LAB — LIPID PANEL
CHOL/HDL RATIO: 3.1 ratio (ref <5.0)
CHOLESTEROL: 144 mg/dL (ref <200)
HDL: 46 mg/dL — ABNORMAL LOW (ref >50)
LDL Cholesterol: 82 mg/dL (ref <100)
TRIGLYCERIDES: 78 mg/dL (ref <150)
VLDL: 16 mg/dL (ref <30)

## 2016-04-20 LAB — COMPLETE METABOLIC PANEL WITH GFR
ALBUMIN: 4 g/dL (ref 3.6–5.1)
ALK PHOS: 59 U/L (ref 33–130)
ALT: 17 U/L (ref 6–29)
AST: 17 U/L (ref 10–35)
BILIRUBIN TOTAL: 0.5 mg/dL (ref 0.2–1.2)
BUN: 14 mg/dL (ref 7–25)
CALCIUM: 9.2 mg/dL (ref 8.6–10.4)
CO2: 30 mmol/L (ref 20–31)
Chloride: 105 mmol/L (ref 98–110)
Creat: 0.76 mg/dL (ref 0.50–1.05)
GFR, Est African American: >89 mL/min (ref >=60)
GFR, Est Non African American: 89 mL/min (ref >=60)
Glucose, Bld: 110 mg/dL — ABNORMAL HIGH (ref 65–99)
POTASSIUM: 4.2 mmol/L (ref 3.5–5.3)
Sodium: 142 mmol/L (ref 135–146)
TOTAL PROTEIN: 7.1 g/dL (ref 6.1–8.1)

## 2016-04-20 LAB — HEMOGLOBIN A1C
Hgb A1c MFr Bld: 6.7 % — ABNORMAL HIGH (ref <5.7)
Mean Plasma Glucose: 146 mg/dL

## 2016-04-20 MED ORDER — KETOROLAC TROMETHAMINE 60 MG/2ML IM SOLN
60.0000 mg | Freq: Once | INTRAMUSCULAR | Status: AC
  Administered 2016-04-20: 60 mg via INTRAMUSCULAR

## 2016-04-20 MED ORDER — DULOXETINE HCL 60 MG PO CPEP: 60.0000 mg | ORAL_CAPSULE | Freq: Every day | ORAL | 3 refills | Status: DC

## 2016-04-20 NOTE — Patient Instructions (Signed)
Annual wellness as before in January  MD appointment in April  Toradol in office today for right knee pain  New for pain is cymbalta one daily, continue nother medication.  Please work on changes in eating , mainly vegetable and fruit , so that you will start losing weight again , and this will help your knee pain and arhtritis

## 2016-04-21 ENCOUNTER — Other Ambulatory Visit (HOSPITAL_COMMUNITY)
Admission: RE | Admit: 2016-04-21 | Discharge: 2016-04-21 | Disposition: A | Discharge status: Home / Self Care | Payer: Medicare Other | Source: Other Acute Inpatient Hospital | Attending: Family Medicine | Admitting: Family Medicine

## 2016-04-21 DIAGNOSIS — E119 Type 2 diabetes mellitus without complications: Secondary | ICD-10-CM | POA: Insufficient documentation

## 2016-04-22 LAB — MICROALBUMIN / CREATININE URINE RATIO
CREATININE, UR: 185.6 mg/dL
Microalb Creat Ratio: 22.2 mg/g creat (ref 0.0–30.0)
Microalb, Ur: 41.2 ug/mL — ABNORMAL HIGH

## 2016-04-24 ENCOUNTER — Encounter: Payer: Self-pay | Admitting: Family Medicine

## 2016-04-24 NOTE — Assessment & Plan Note (Signed)
Angela [name] is reminded of the importance of commitment to daily physical activity for 30 minutes or more, as able and the need to limit carbohydrate intake to 30 to 60 grams per meal to help with blood sugar control.   The need to take medication as prescribed, test blood sugar as directed, and to call between visits if there is a concern that blood sugar is uncontrolled is also discussed.   Angela [name] is reminded of the importance of daily foot exam, annual eye examination, and good blood sugar, blood pressure and cholesterol control. Controlled, no change in medication   Diabetic Labs Latest Ref Rng & Units 04/21/2016 04/19/2016 07/27/2015 10/14/2014 08/31/2014  HbA1c <5.7 % - 6.7(H) 6.6(H) - 7.1(H)  Microalbumin Not Estab. ug/mL 41.2(H) - - - 1.1  Micro/Creat Ratio 0.0 - 30.0 mg/g creat 22.2 - - - 7.0  Chol <200 mg/dL - 144 145 - 173  HDL >50 mg/dL - 46(L) 48 - 44(L)  Calc LDL <100 mg/dL - 82 81 - 113(H)  Triglycerides <150 mg/dL - 78 80 - 82  Creatinine 0.50 - 1.05 mg/dL - 0.76 0.75 0.84 0.78   BP/Weight 04/20/2016 03/09/2016 02/14/2016 02/02/2016 12/29/2015 12/03/2015 XX123456  Systolic BP 0000000 XX123456 XX123456 0000000 Q000111Q 123456 0000000  Diastolic BP 84 84 99 98 92 80 72  Wt. (Lbs) 315 295 - 319.2 310 306 301  BMI 54.07 50.64 - 54.79 53.21 52.52 51.64   Foot/eye exam completion dates Latest Ref Rng & Units 03/07/2016 09/14/2015  Eye Exam No Retinopathy No Retinopathy -  Foot Form Completion - - Done

## 2016-04-24 NOTE — Assessment & Plan Note (Signed)
Worsened with recent weight gain. Back exercises discussed and need for weight loss

## 2016-04-24 NOTE — Assessment & Plan Note (Signed)
Elevated systolic at visit, pt in severe and uncontrolled pain, no med change DASH diet and commitment to daily physical activity for a minimum of 30 minutes discussed and encouraged, as a part of hypertension management. The importance of attaining a healthy weight is also discussed.  BP/Weight 04/20/2016 03/09/2016 02/14/2016 02/02/2016 12/29/2015 12/03/2015 XX123456  Systolic BP 0000000 XX123456 XX123456 0000000 Q000111Q 123456 0000000  Diastolic BP 84 84 99 98 92 80 72  Wt. (Lbs) 315 295 - 319.2 310 306 301  BMI 54.07 50.64 - 54.79 53.21 52.52 51.64

## 2016-04-24 NOTE — Progress Notes (Signed)
Angela [name]     MRN: [id number]      DOB: [birth year]   HPI Angela [name] is here for follow up and re-evaluation of chronic medical conditions, medication management , SPECIFICALLY OF CHRONIC PAIN MEDICATION and review of any available recent lab and radiology data.  Preventive health is updated, specifically  Cancer screening and Immunization.   Questions or concerns regarding consultations or procedures which the PT has had in the interim are  Addressed.RECENTLY DENIED CLEARANCE FOR RIGHT KNEE REPLACEMENT BY HER CARDIOLOGIST, AND ALSO ORTHOPEDICS, DEPRESSED ABOUT THIS AS HER PAIN AND LACK OF MOBILITY ARE BOTHERSOME, HAS ALSO REGAINED A LOT OF WEIGHT RECENTLY LOST UNFORTUNATELY The PT denies any adverse reactions to current medications since the last visit.    ROS Denies recent fever or chills. Denies sinus pressure, nasal congestion, ear pain or sore throat. Denies chest congestion, productive cough or wheezing. Denies chest pains, palpitations and leg swelling Denies abdominal pain, nausea, vomiting,diarrhea or constipation.   Denies dysuria, frequency, hesitancy or incontinence. Denies joint pain, swelling and limitation in mobility. Denies headaches, seizures, numbness, or tingling. Denies depression, anxiety or insomnia. Denies skin break down or rash.   PE  BP (!) 160/84   Pulse 91   Resp 16   Ht 5\' 4"  (1.626 m)   Wt (!) 315 lb (142.9 kg)   SpO2 97%   BMI 54.07 kg/m   Patient alert and oriented and in no cardiopulmonary distress.  HEENT: No facial asymmetry, EOMI,   oropharynx pink and moist.  Neck supple no JVD, no mass.  Chest: Clear to auscultation bilaterally.  CVS: S1, S2 no murmurs, no S3.Regular rate.  ABD: Soft non tender.   Ext: No edema  MS: Adequate ROM spine, shoulders, hips and knees.  Skin: Intact, no ulcerations or rash noted.  Psych: Good eye contact, normal affect. Memory intact not anxious or depressed appearing.  CNS: CN 2-12 intact,  power,  normal throughout.no focal deficits noted.   Assessment & Plan  Knee pain, right UNCONTROLLED , POOR / NO SURGICAL CANDIDATE CURRENTLY. tORADOL ADMINISTERED AND CYMBLTA ADDED. cHRONIC PAIN MANAGEMENT POLICY ALSO REVIEWED AND LITERATURE PROVIDED  Essential hypertension Elevated systolic at visit, pt in severe and uncontrolled pain, no med change DASH diet and commitment to daily physical activity for a minimum of 30 minutes discussed and encouraged, as a part of hypertension management. The importance of attaining a healthy weight is also discussed.  BP/Weight 04/20/2016 03/09/2016 02/14/2016 02/02/2016 12/29/2015 12/03/2015 [id number]  Systolic BP 0000000 XX123456 XX123456 0000000 Q000111Q 123456 0000000  Diastolic BP 84 84 99 98 92 80 72  Wt. (Lbs) 315 295 - 319.2 310 306 301  BMI 54.07 50.64 - 54.79 53.21 52.52 51.64       Depression with anxiety Increased depression due to chronic pain and advice against knee surgery, not suicidal or homicidal. Add cymbalta to chronic regime  Morbid obesity Deteriorated. Patient re-educated about  the importance of commitment to a  minimum of 150 minutes of exercise per week.  The importance of healthy food choices with portion control discussed. Encouraged to start a food diary, count calories and to consider  joining a support group. Sample diet sheets offered. Goals set by the patient for the next several months.   Weight /BMI 04/20/2016 03/09/2016 02/02/2016  WEIGHT 315 lb 295 lb 319 lb 3.2 oz  HEIGHT 5\' 4"  5\' 4"  5\' 4"   BMI 54.07 kg/m2 50.64 kg/m2 54.79 kg/m2      Diabetes mellitus  without complication Angela [name] is reminded of the importance of commitment to daily physical activity for 30 minutes or more, as able and the need to limit carbohydrate intake to 30 to 60 grams per meal to help with blood sugar control.   The need to take medication as prescribed, test blood sugar as directed, and to call between visits if there is a concern that blood sugar is  uncontrolled is also discussed.   Angela [name] is reminded of the importance of daily foot exam, annual eye examination, and good blood sugar, blood pressure and cholesterol control. Controlled, no change in medication   Diabetic Labs Latest Ref Rng & Units 04/21/2016 04/19/2016 07/27/2015 10/14/2014 08/31/2014  HbA1c <5.7 % - 6.7(H) 6.6(H) - 7.1(H)  Microalbumin Not Estab. ug/mL 41.2(H) - - - 1.1  Micro/Creat Ratio 0.0 - 30.0 mg/g creat 22.2 - - - 7.0  Chol <200 mg/dL - 144 145 - 173  HDL >50 mg/dL - 46(L) 48 - 44(L)  Calc LDL <100 mg/dL - 82 81 - 113(H)  Triglycerides <150 mg/dL - 78 80 - 82  Creatinine 0.50 - 1.05 mg/dL - 0.76 0.75 0.84 0.78   BP/Weight 04/20/2016 03/09/2016 02/14/2016 02/02/2016 12/29/2015 12/03/2015 [id number]  Systolic BP 0000000 XX123456 XX123456 0000000 Q000111Q 123456 0000000  Diastolic BP 84 84 99 98 92 80 72  Wt. (Lbs) 315 295 - 319.2 310 306 301  BMI 54.07 50.64 - 54.79 53.21 52.52 51.64   Foot/eye exam completion dates Latest Ref Rng & Units 03/07/2016 09/14/2015  Eye Exam No Retinopathy No Retinopathy -  Foot Form Completion - - Done        Back pain Worsened with recent weight gain. Back exercises discussed and need for weight loss  Hyperlipidemia LDL goal <70 Hyperlipidemia:Low fat diet discussed and encouraged.   Lipid Panel  Lab Results  Component Value Date   CHOL 144 04/19/2016   HDL 46 (L) 04/19/2016   LDLCALC 82 04/19/2016   TRIG 78 04/19/2016   CHOLHDL 3.1 04/19/2016   Needs to increase exercise and lower fat intake

## 2016-04-24 NOTE — Assessment & Plan Note (Signed)
Deteriorated. Patient re-educated about  the importance of commitment to a  minimum of 150 minutes of exercise per week.  The importance of healthy food choices with portion control discussed. Encouraged to start a food diary, count calories and to consider  joining a support group. Sample diet sheets offered. Goals set by the patient for the next several months.   Weight /BMI 04/20/2016 03/09/2016 02/02/2016  WEIGHT 315 lb 295 lb 319 lb 3.2 oz  HEIGHT 5\' 4"  5\' 4"  5\' 4"   BMI 54.07 kg/m2 50.64 kg/m2 54.79 kg/m2

## 2016-04-24 NOTE — Assessment & Plan Note (Signed)
UNCONTROLLED , POOR / NO SURGICAL CANDIDATE CURRENTLY. tORADOL ADMINISTERED AND CYMBLTA ADDED. cHRONIC PAIN MANAGEMENT POLICY ALSO REVIEWED AND LITERATURE PROVIDED

## 2016-04-24 NOTE — Assessment & Plan Note (Signed)
Hyperlipidemia:Low fat diet discussed and encouraged.   Lipid Panel  Lab Results  Component Value Date   CHOL 144 04/19/2016   HDL 46 (L) 04/19/2016   LDLCALC 82 04/19/2016   TRIG 78 04/19/2016   CHOLHDL 3.1 04/19/2016   Needs to increase exercise and lower fat intake

## 2016-04-24 NOTE — Assessment & Plan Note (Signed)
Increased depression due to chronic pain and advice against knee surgery, not suicidal or homicidal. Add cymbalta to chronic regime

## 2016-05-03 ENCOUNTER — Other Ambulatory Visit: Payer: Self-pay

## 2016-05-03 ENCOUNTER — Ambulatory Visit (INDEPENDENT_AMBULATORY_CARE_PROVIDER_SITE_OTHER): Payer: Medicare Other

## 2016-05-03 VITALS — BP 118/84 | HR 81 | Temp 98.6°F | Resp 18 | Ht 64.0 in | Wt 320.0 lb

## 2016-05-03 DIAGNOSIS — Z0000 Encounter for general adult medical examination without abnormal findings: Secondary | ICD-10-CM

## 2016-05-03 MED ORDER — OXYCODONE HCL ER 40 MG PO T12A: 40.0000 mg | EXTENDED_RELEASE_TABLET | Freq: Two times a day (BID) | ORAL | 0 refills | Status: DC

## 2016-05-03 NOTE — Patient Instructions (Addendum)
Health maintenance: Due for colonoscopy, please call Dr. Oneida Alar office and schedule this ASAP. Also due for Tetanus, call your insurance company to see if this is covered.   Abnormal screenings: None   Patient concerns: None   Nurse concerns: Weight, increase exercise and water intake. Limit excess calories, foods high in fat, and carbohydrates.   Next PCP appt: with Dr. Moshe Cipro on 08/23/2016 at 10:20 am   Health Maintenance, Female Introduction Adopting a healthy lifestyle and getting preventive care can go a long way to promote health and wellness. Talk with your health care provider about what schedule of regular examinations is right for you. This is a good chance for you to check in with your provider about disease prevention and staying healthy. In between checkups, there are plenty of things you can do on your own. Experts have done a lot of research about which lifestyle changes and preventive measures are most likely to keep you healthy. Ask your health care provider for more information. Weight and diet Eat a healthy diet  Be sure to include plenty of vegetables, fruits, low-fat dairy products, and lean protein.  Do not eat a lot of foods high in solid fats, added sugars, or salt.  Get regular exercise. This is one of the most important things you can do for your health.  Most adults should exercise for at least 150 minutes each week. The exercise should increase your heart rate and make you sweat (moderate-intensity exercise).  Most adults should also do strengthening exercises at least twice a week. This is in addition to the moderate-intensity exercise. Maintain a healthy weight  Body mass index (BMI) is a measurement that can be used to identify possible weight problems. It estimates body fat based on height and weight. Your health care provider can help determine your BMI and help you achieve or maintain a healthy weight.  For females 20 years of age and older:  A  BMI below 18.5 is considered underweight.  A BMI of 18.5 to 24.9 is normal.  A BMI of 25 to 29.9 is considered overweight.  A BMI of 30 and above is considered obese. Watch levels of cholesterol and blood lipids  You should start having your blood tested for lipids and cholesterol at 56 years of age, then have this test every 5 years.  You may need to have your cholesterol levels checked more often if:  Your lipid or cholesterol levels are high.  You are older than 56 years of age.  You are at high risk for heart disease. Cancer screening Lung Cancer  Lung cancer screening is recommended for adults 65-43 years old who are at high risk for lung cancer because of a history of smoking.  A yearly low-dose CT scan of the lungs is recommended for people who:  Currently smoke.  Have quit within the past 15 years.  Have at least a 30-pack-year history of smoking. A pack year is smoking an average of one pack of cigarettes a day for 1 year.  Yearly screening should continue until it has been 15 years since you quit.  Yearly screening should stop if you develop a health problem that would prevent you from having lung cancer treatment. Breast Cancer  Practice breast self-awareness. This means understanding how your breasts normally appear and feel.  It also means doing regular breast self-exams. Let your health care provider know about any changes, no matter how small.  If you are in your 20s or 30s, you  should have a clinical breast exam (CBE) by a health care provider every 1-3 years as part of a regular health exam.  If you are 9 or older, have a CBE every year. Also consider having a breast X-ray (mammogram) every year.  If you have a family history of breast cancer, talk to your health care provider about genetic screening.  If you are at high risk for breast cancer, talk to your health care provider about having an MRI and a mammogram every year.  Breast cancer gene (BRCA)  assessment is recommended for women who have family members with BRCA-related cancers. BRCA-related cancers include:  Breast.  Ovarian.  Tubal.  Peritoneal cancers.  Results of the assessment will determine the need for genetic counseling and BRCA1 and BRCA2 testing. Cervical Cancer  Your health care provider may recommend that you be screened regularly for cancer of the pelvic organs (ovaries, uterus, and vagina). This screening involves a pelvic examination, including checking for microscopic changes to the surface of your cervix (Pap test). You may be encouraged to have this screening done every 3 years, beginning at age 80.  For women ages 53-65, health care providers may recommend pelvic exams and Pap testing every 3 years, or they may recommend the Pap and pelvic exam, combined with testing for human papilloma virus (HPV), every 5 years. Some types of HPV increase your risk of cervical cancer. Testing for HPV may also be done on women of any age with unclear Pap test results.  Other health care providers may not recommend any screening for nonpregnant women who are considered low risk for pelvic cancer and who do not have symptoms. Ask your health care provider if a screening pelvic exam is right for you.  If you have had past treatment for cervical cancer or a condition that could lead to cancer, you need Pap tests and screening for cancer for at least 20 years after your treatment. If Pap tests have been discontinued, your risk factors (such as having a new sexual partner) need to be reassessed to determine if screening should resume. Some women have medical problems that increase the chance of getting cervical cancer. In these cases, your health care provider may recommend more frequent screening and Pap tests. Colorectal Cancer  This type of cancer can be detected and often prevented.  Routine colorectal cancer screening usually begins at 56 years of age and continues through 56  years of age.  Your health care provider may recommend screening at an earlier age if you have risk factors for colon cancer.  Your health care provider may also recommend using home test kits to check for hidden blood in the stool.  A small camera at the end of a tube can be used to examine your colon directly (sigmoidoscopy or colonoscopy). This is done to check for the earliest forms of colorectal cancer.  Routine screening usually begins at age 29.  Direct examination of the colon should be repeated every 5-10 years through 57 years of age. However, you may need to be screened more often if early forms of precancerous polyps or small growths are found. Skin Cancer  Check your skin from head to toe regularly.  Tell your health care provider about any new moles or changes in moles, especially if there is a change in a mole's shape or color.  Also tell your health care provider if you have a mole that is larger than the size of a pencil eraser.  Always use  sunscreen. Apply sunscreen liberally and repeatedly throughout the day.  Protect yourself by wearing long sleeves, pants, a wide-brimmed hat, and sunglasses whenever you are outside. Heart disease, diabetes, and high blood pressure  High blood pressure causes heart disease and increases the risk of stroke. High blood pressure is more likely to develop in:  People who have blood pressure in the high end of the normal range (130-139/85-89 mm Hg).  People who are overweight or obese.  People who are African American.  If you are 34-93 years of age, have your blood pressure checked every 3-5 years. If you are 97 years of age or older, have your blood pressure checked every year. You should have your blood pressure measured twice-once when you are at a hospital or clinic, and once when you are not at a hospital or clinic. Record the average of the two measurements. To check your blood pressure when you are not at a hospital or clinic,  you can use:  An automated blood pressure machine at a pharmacy.  A home blood pressure monitor.  If you are between 68 years and 78 years old, ask your health care provider if you should take aspirin to prevent strokes.  Have regular diabetes screenings. This involves taking a blood sample to check your fasting blood sugar level.  If you are at a normal weight and have a low risk for diabetes, have this test once every three years after 56 years of age.  If you are overweight and have a high risk for diabetes, consider being tested at a younger age or more often. Preventing infection Hepatitis B  If you have a higher risk for hepatitis B, you should be screened for this virus. You are considered at high risk for hepatitis B if:  You were born in a country where hepatitis B is common. Ask your health care provider which countries are considered high risk.  Your parents were born in a high-risk country, and you have not been immunized against hepatitis B (hepatitis B vaccine).  You have HIV or AIDS.  You use needles to inject street drugs.  You live with someone who has hepatitis B.  You have had sex with someone who has hepatitis B.  You get hemodialysis treatment.  You take certain medicines for conditions, including cancer, organ transplantation, and autoimmune conditions. Hepatitis C  Blood testing is recommended for:  Everyone born from [birth year] through 1965.  Anyone with known risk factors for hepatitis C. Sexually transmitted infections (STIs)  You should be screened for sexually transmitted infections (STIs) including gonorrhea and chlamydia if:  You are sexually active and are younger than 56 years of age.  You are older than 56 years of age and your health care provider tells you that you are at risk for this type of infection.  Your sexual activity has changed since you were last screened and you are at an increased risk for chlamydia or gonorrhea. Ask your  health care provider if you are at risk.  If you do not have HIV, but are at risk, it may be recommended that you take a prescription medicine daily to prevent HIV infection. This is called pre-exposure prophylaxis (PrEP). You are considered at risk if:  You are sexually active and do not regularly use condoms or know the HIV status of your partner(s).  You take drugs by injection.  You are sexually active with a partner who has HIV. Talk with your health care provider about whether  you are at high risk of being infected with HIV. If you choose to begin PrEP, you should first be tested for HIV. You should then be tested every 3 months for as long as you are taking PrEP. Osteoporosis and menopause  Osteoporosis is a disease in which the bones lose minerals and strength with aging. This can result in serious bone fractures. Your risk for osteoporosis can be identified using a bone density scan.  If you are 69 years of age or older, or if you are at risk for osteoporosis and fractures, ask your health care provider if you should be screened.  Ask your health care provider whether you should take a calcium or vitamin D supplement to lower your risk for osteoporosis.  Menopause may have certain physical symptoms and risks.  Hormone replacement therapy may reduce some of these symptoms and risks. Talk to your health care provider about whether hormone replacement therapy is right for you. Follow these instructions at home:  Schedule regular health, dental, and eye exams.  Stay current with your immunizations.  Do not use any tobacco products including cigarettes, chewing tobacco, or electronic cigarettes.  If you are pregnant, do not drink alcohol.  If you are breastfeeding, limit how much and how often you drink alcohol.  Limit alcohol intake to no more than 1 drink per day for nonpregnant women. One drink equals 12 ounces of beer, 5 ounces of wine, or 1 ounces of hard liquor.  Do not  use street drugs.  Do not share needles.  Ask your health care provider for help if you need support or information about quitting drugs.  Tell your health care provider if you often feel depressed.  Tell your health care provider if you have ever been abused or do not feel safe at home. This information is not intended to replace advice given to you by your health care provider. Make sure you discuss any questions you have with your health care provider. Document Released: 10/31/2010 Document Revised: 09/23/2015 Document Reviewed: 01/19/2015  2017 Elsevier

## 2016-05-03 NOTE — Progress Notes (Signed)
Subjective:   Angela [name] is a 56 y.o. female who presents for Medicare Annual (Subsequent) preventive examination.  Review of Systems:  Cardiac Risk Factors include: diabetes mellitus;family history of premature cardiovascular disease;hypertension;dyslipidemia;obesity (BMI >30kg/m2);sedentary lifestyle     Objective:     Vitals: BP 118/84   Pulse 81   Temp 98.6 F (37 C) (Oral)   Resp 18   Ht 5\' 4"  (1.626 m)   Wt (!) 320 lb (145.2 kg)   SpO2 95%   BMI 54.93 kg/m   Body mass index is 54.93 kg/m.   Tobacco History  Smoking Status  . Never Smoker  Smokeless Tobacco  . Never Used     Counseling given: Not Answered   Past Medical History:  Diagnosis Date  . Anxiety   . Arthritis   . Asthma   . Coronary atherosclerosis of native coronary artery    Mild nonobstructive CAD by cardiac catheterization 2008 - Dr. Terrence Dupont  . Degenerative disc disease   . Depression   . Dysrhythmia   . Essential hypertension, benign   . Folliculitis 0000000  . GERD (gastroesophageal reflux disease)   . H/O hiatal hernia   . Helicobacter pylori gastritis 2008  . Hypercholesterolemia   . Hypertension   . Hyperthyroidism    s/p radiation  . Low back pain   . Migraine   . Nephrolithiasis    Recurring episodes since 2004  . Severe obesity (BMI >= 40) (HCC)   . Sleep apnea    STOP BANG SCORE 6no cpap used  . Type 2 diabetes mellitus with diabetic neuropathy [facility])    Past Surgical History:  Procedure Laterality Date  . ABDOMINAL HYSTERECTOMY    . BACK SURGERY  1993  . BACK SURGERY  1994  . BACK SURGERY  2002  . BACK SURGERY  2011   [place]  . COLONOSCOPY  2000 [facility]   INT HEMORRHOIDS/FISSURE  . COLONOSCOPY  2003 [facility], CHANGE IN BOWEL HABITS   INT HEMORRHOIDS  . COLONOSCOPY  2006 [facility]   INT HEMORRHOIDS  . COLONOSCOPY  2007 [facility]   INT HEMORRHOIDS  . CYSTOSCOPY WITH STENT PLACEMENT Left 07/25/2012   Procedure: CYSTOSCOPY, left  retograde pyelogram WITH left ureteral  STENT PLACEMENT;  Surgeon: Claybon Jabs, MD;  Location: [facility];  Service: Urology;  Laterality: Left;  . CYSTOSCOPY/RETROGRADE/URETEROSCOPY  12/04/2011   Procedure: CYSTOSCOPY/RETROGRADE/URETEROSCOPY;  Surgeon: Molli Hazard, MD;  Location: [facility];  Service: Urology;  Laterality: Right;  . CYSTOSCOPY/RETROGRADE/URETEROSCOPY/STONE EXTRACTION WITH BASKET Left 09/03/2012   Procedure: CYSTOSCOPY/RETROGRADE pyelogram/digital URETEROSCOPY/STONE EXTRACTION WITH BASKET, left stent removal;  Surgeon: Molli Hazard, MD;  Location: [facility];  Service: Urology;  Laterality: Left;  . ESOPHAGOGASTRODUODENOSCOPY  2008   [id number] hiatal hernia./Normal esophagus without evidence of Barrett's mass, stricture, erosion or ulceration./Normal duodenal bulb and second portion of the duodenum./Diffuse erythema in the body and the antrum with occasional erosion.  Biopsies obtained via cold forceps to evaluate for H. pylori gastritis  . FRACTURE SURGERY  1999   right clavicle  . HOLMIUM LASER APPLICATION Left [id number]   Procedure: HOLMIUM LASER APPLICATION;  Surgeon: Molli Hazard, MD;  Location: [facility];  Service: Urology;  Laterality: Left;  . KNEE ARTHROSCOPY  10/04/2010,    right knee arthroscopy, dr Theda Sers  . left knee arthroscopic surgery  1999  . Left salphingectomy secondary to ectopic pregnancy  1991  . PARTIAL HYSTERECTOMY  1991  . rotary cuff  Right 05/14/2014   [facility]  . UPPER GASTROINTESTINAL ENDOSCOPY  2008 abd pain   H. pylori gastritis  . UPPER GASTROINTESTINAL ENDOSCOPY  1996 [place] [place]   PUD   Family History  Problem Relation Age of Onset  . Heart disease Mother   . Hypertension Mother   . Cancer Mother     Cervical   . Asthma Mother   . Heart attack Father   . Cancer Father     Prostate  . Colon cancer Father     DECEASED AGE 67  . Colon polyps Neg Hx    History  Sexual Activity  . Sexual activity: Not Currently  .  Partners: Male  . Birth control/ protection: Surgical    Outpatient Encounter Prescriptions as of 05/03/2016  Medication Sig  . albuterol (PROAIR HFA) 108 (90 BASE) MCG/ACT inhaler Inhale 2 puffs into the lungs every 6 (six) hours as needed for wheezing or shortness of breath.  Marland Kitchen amLODipine (NORVASC) 10 MG tablet TAKE 1 TABLET BY MOUTH EVERY DAY  . atorvastatin (LIPITOR) 80 MG tablet TAKE 1 TABLET BY MOUTH EVERY DAY  . azithromycin (ZITHROMAX) 250 MG tablet Use as directed  . budesonide-formoterol (SYMBICORT) 160-4.5 MCG/ACT inhaler INHALE 2 PUUFS BY MOUTH TWICE DAILY  . canagliflozin (INVOKANA) 300 MG TABS tablet Take 300 mg by mouth daily before breakfast.  . chlorpheniramine-HYDROcodone (TUSSIONEX PENNKINETIC ER) 10-8 MG/5ML SUER Take 5 mLs by mouth at bedtime as needed for cough.  . cloNIDine (CATAPRES) 0.3 MG tablet TAKE ONE TABLET AT 9 PM EVERY NIGHT FOR HIGH BLOOD PRESSURE  . cyclobenzaprine (FLEXERIL) 10 MG tablet TAKE 1 TABLET BY MOUTH 3 TIMES A DAY  . diazepam (VALIUM) 5 MG tablet TAKE 1 TABLET BY MOUTH 3 TIMES A DAY  . DULoxetine (CYMBALTA) 60 MG capsule Take 1 capsule (60 mg total) by mouth daily.  . ergocalciferol (VITAMIN D2) 50000 units capsule Take 1 capsule (50,000 Units total) by mouth once a week. Takes on Monday  . esomeprazole (NEXIUM) 40 MG capsule TAKE 1 CAPSULE (40 MG TOTAL) BY MOUTH DAILY.  . fluconazole (DIFLUCAN) 150 MG tablet One tablet once daily for 2 days  . FLUoxetine (PROZAC) 40 MG capsule TAKE ONE CAPSULE BY MOUTH EVERY DAY  . fluticasone (FLONASE) 50 MCG/ACT nasal spray Place 2 sprays into both nostrils daily.  . furosemide (LASIX) 40 MG tablet TAKE 1 TABLET BY MOUTH TWICE A DAY  . gabapentin (NEURONTIN) 800 MG tablet TAKE 1 TABLET BY MOUTH 4 TIMES A DAY  . ipratropium-albuterol (DUONEB) 0.5-2.5 (3) MG/3ML SOLN Take 3 mLs by nebulization every 6 (six) hours as needed.  Marland Kitchen KLOR-CON M20 20 MEQ tablet TAKE 1 TABLET (20 MEQ TOTAL) BY MOUTH 2 (TWO) TIMES DAILY.  .  metFORMIN (GLUCOPHAGE) 1000 MG tablet TAKE 1 TABLET BY MOUTH TWICE A DAY WITH FOOD  . montelukast (SINGULAIR) 10 MG tablet TAKE 1 TABLET (10 MG TOTAL) BY MOUTH AT BEDTIME.  Marland Kitchen NITROSTAT 0.4 MG SL tablet PLACE 1 TABLET UNDER TONGUE IF NEEDED FOR CHEST PAIN..MAX OF 3 TIMES  . oxyCODONE (OXYCONTIN) 40 mg 12 hr tablet Take 1 tablet (40 mg total) by mouth every 12 (twelve) hours.  . potassium chloride SA (K-DUR,KLOR-CON) 20 MEQ tablet Take 1 tablet (20 mEq total) by mouth 2 (two) times daily.  . predniSONE (STERAPRED UNI-PAK 21 TAB) 5 MG (21) TBPK tablet Take 1 tablet (5 mg total) by mouth as directed. Use as directed  . PROAIR HFA 108 (90 BASE) MCG/ACT  inhaler INHALE 2 PUFFS BY MOUTH EVERY 6-8HRS AS NEEDED  . tiZANidine (ZANAFLEX) 4 MG tablet TAKE 1 TABLET (4 MG TOTAL) BY MOUTH 3 (THREE) TIMES DAILY.  Marland Kitchen triamcinolone cream (KENALOG) 0.1 % Apply 1 application topically 2 (two) times daily.  Marland Kitchen triamterene-hydrochlorothiazide (MAXZIDE) 75-50 MG tablet TAKE 1 TABLET BY MOUTH DAILY.  Marland Kitchen zolpidem (AMBIEN) 10 MG tablet TAKE 1 TABLET BY MOUTH AT BEDTIME   No facility-administered encounter medications on file as of 05/03/2016.     Activities of Daily Living In your present state of health, do you have any difficulty performing the following activities: 05/03/2016  Hearing? N  Vision? N  Difficulty concentrating or making decisions? N  Walking or climbing stairs? Y  Dressing or bathing? N  Doing errands, shopping? N  Preparing Food and eating ? N  Using the Toilet? N  In the past six months, have you accidently leaked urine? N  Do you have problems with loss of bowel control? N  Managing your Medications? N  Managing your Finances? N  Housekeeping or managing your Housekeeping? N  Some recent data might be hidden    Patient Care Team: Fayrene Helper, MD as PCP - General Danie Binder, MD (Gastroenterology) Rolan Bucco, MD as Attending Physician (Urology) Sydnee Cabal, MD as Consulting  Physician (Orthopedic Surgery) Debby Bud, OT as Occupational Therapist (Occupational Therapy)    Assessment:    Exercise Activities and Dietary recommendations Current Exercise Habits: The patient does not participate in regular exercise at present, Exercise limited by: orthopedic condition(s);cardiac condition(s)  Goals    . Exercise 3x per week (30 min per time)          Patient would like to start walking 3 times a week for 60 minutes at a time.      Fall Risk Fall Risk  05/03/2016 04/20/2016 11/11/2014 08/02/2012  Falls in the past year? Yes Yes Yes Yes  Number falls in past yr: 1 1 1 2  or more  Injury with Fall? No - - -  Risk for fall due to : Impaired mobility - - -  Follow up Falls evaluation completed;Falls prevention discussed - - -   Depression Screen PHQ 2/9 Scores 05/03/2016 04/20/2016 11/11/2014 11/11/2014  PHQ - 2 Score 0 4 2 3   PHQ- 9 Score - 6 - 9     Cognitive Function  Normal   6CIT Screen 05/03/2016  What Year? 0 points  What month? 0 points  What time? 0 points  Count back from 20 0 points  Months in reverse 0 points  Repeat phrase 0 points  Total Score 0    Immunization History  Administered Date(s) Administered  . Influenza Split 03/30/2011  . Influenza Whole 03/21/2005, 02/09/2006  . Influenza,inj,Quad PF,36+ Mos 02/11/2014, 07/27/2015, 03/09/2016  . Pneumococcal Polysaccharide-23 04/17/2006, 09/14/2015  . Td 04/17/2006   Screening Tests Health Maintenance  Topic Date Due  . COLONOSCOPY  05/05/2010  . TETANUS/TDAP  04/17/2016  . FOOT EXAM  09/18/2016  . HEMOGLOBIN A1C  10/18/2016  . PAP SMEAR  01/28/2017  . OPHTHALMOLOGY EXAM  03/07/2017  . URINE MICROALBUMIN  04/21/2017  . MAMMOGRAM  08/17/2017  . PNEUMOCOCCAL POLYSACCHARIDE VACCINE  Completed  . INFLUENZA VACCINE  Completed  . Hepatitis C Screening  Completed  . HIV Screening  Completed      Plan:  I have personally reviewed and addressed the Medicare Annual Wellness  questionnaire and have noted the following in the patient's chart:  A. Medical and social history B. Use of alcohol, tobacco or illicit drugs  C. Current medications and supplements D. Functional ability and status E.  Nutritional status F.  Physical activity G. Advance directives H. List of other physicians I.  Hospitalizations, surgeries, and ER visits in previous 12 months J.  Dale to include hearing, vision, cognitive, depression L. Referrals and appointments   In addition, I have reviewed and discussed with patient certain preventive protocols, quality metrics, and best practice recommendations. A written personalized care plan for preventive services as well as general preventive health recommendations were provided to patient.  Signed,   Stormy Fabian, LPN Lead Nurse Health Advisor

## 2016-05-04 NOTE — Addendum Note (Signed)
Addended by: Stormy Fabian D on: 05/04/2016 04:11 PM   Modules accepted: Miquel Dunn

## 2016-05-05 ENCOUNTER — Other Ambulatory Visit: Payer: Self-pay | Admitting: Family Medicine

## 2016-05-06 ENCOUNTER — Other Ambulatory Visit: Payer: Self-pay | Admitting: Family Medicine

## 2016-05-06 DIAGNOSIS — M62830 Muscle spasm of back: Secondary | ICD-10-CM

## 2016-05-07 ENCOUNTER — Other Ambulatory Visit: Payer: Self-pay | Admitting: Family Medicine

## 2016-05-09 ENCOUNTER — Other Ambulatory Visit: Payer: Self-pay | Admitting: Family Medicine

## 2016-05-09 DIAGNOSIS — M1711 Unilateral primary osteoarthritis, right knee: Secondary | ICD-10-CM | POA: Diagnosis not present

## 2016-05-09 DIAGNOSIS — J45909 Unspecified asthma, uncomplicated: Secondary | ICD-10-CM | POA: Diagnosis not present

## 2016-05-11 ENCOUNTER — Other Ambulatory Visit: Payer: Self-pay

## 2016-05-11 ENCOUNTER — Telehealth: Payer: Self-pay

## 2016-05-11 ENCOUNTER — Other Ambulatory Visit: Payer: Self-pay | Admitting: Family Medicine

## 2016-05-11 MED ORDER — OXYCODONE ER 18 MG PO C12A: 1.0000 | EXTENDED_RELEASE_CAPSULE | Freq: Two times a day (BID) | ORAL | 0 refills | Status: DC

## 2016-05-11 NOTE — Telephone Encounter (Signed)
Spoke with pharmacy, have got the dosing equivalent of Xtampza, which is 50 % of prior dose, pls give pt the script for total of 3 months, and explain formulary coverage.

## 2016-05-11 NOTE — Telephone Encounter (Signed)
Patient has called this am x 2 inquiring about her pain medication.

## 2016-05-11 NOTE — Telephone Encounter (Signed)
Her oxycontin has been denied. Must try 2 formulary alternatives and information has been sent to Dr for review.

## 2016-05-11 NOTE — Telephone Encounter (Signed)
Patient aware and will come collect  

## 2016-05-16 DIAGNOSIS — M1711 Unilateral primary osteoarthritis, right knee: Secondary | ICD-10-CM | POA: Diagnosis not present

## 2016-05-23 DIAGNOSIS — M1711 Unilateral primary osteoarthritis, right knee: Secondary | ICD-10-CM | POA: Diagnosis not present

## 2016-05-30 ENCOUNTER — Telehealth: Payer: Self-pay

## 2016-05-30 ENCOUNTER — Telehealth: Payer: Self-pay | Admitting: Family Medicine

## 2016-05-30 MED ORDER — OSELTAMIVIR PHOSPHATE 75 MG PO CAPS: 75.0000 mg | ORAL_CAPSULE | Freq: Two times a day (BID) | ORAL | 0 refills | Status: AC

## 2016-05-30 NOTE — Telephone Encounter (Signed)
Angela [name] is c/o cough, fever sorethroat, congestion, symptoms started yesterday evening, please advise?

## 2016-05-30 NOTE — Telephone Encounter (Signed)
Acute onset of fever Yes.   Headache Yes.   Body ache Yes.   Fatigue Yes.   Non productive cough Yes.   Sore throat Yes.   Nasal discharge Yes.    Onset between 1-4 days of possible exposure Yes.    Recommendation:  Fluid, rest, Tylenol for control of fever  Expect 5 days before feeling better and not so contagious and generally better in 7-10 days.  Standard treatment Tama flu 75 mg 1 tablet twice daily times 5 days  Please call office if symptoms do not improve or worsen in 5 days after beginning treatment.      

## 2016-06-02 DIAGNOSIS — M545 Low back pain: Secondary | ICD-10-CM | POA: Diagnosis not present

## 2016-06-02 DIAGNOSIS — M5137 Other intervertebral disc degeneration, lumbosacral region: Secondary | ICD-10-CM | POA: Insufficient documentation

## 2016-06-02 DIAGNOSIS — M48061 Spinal stenosis, lumbar region without neurogenic claudication: Secondary | ICD-10-CM | POA: Diagnosis not present

## 2016-06-02 DIAGNOSIS — M5186 Other intervertebral disc disorders, lumbar region: Secondary | ICD-10-CM | POA: Diagnosis not present

## 2016-06-02 DIAGNOSIS — Z967 Presence of other bone and tendon implants: Secondary | ICD-10-CM | POA: Diagnosis not present

## 2016-06-02 DIAGNOSIS — M4186 Other forms of scoliosis, lumbar region: Secondary | ICD-10-CM | POA: Diagnosis not present

## 2016-06-02 DIAGNOSIS — M4316 Spondylolisthesis, lumbar region: Secondary | ICD-10-CM | POA: Diagnosis not present

## 2016-06-02 DIAGNOSIS — Z981 Arthrodesis status: Secondary | ICD-10-CM | POA: Diagnosis not present

## 2016-06-05 NOTE — Telephone Encounter (Signed)
Noted  

## 2016-06-05 NOTE — Telephone Encounter (Signed)
Spoke directly with pt she was seen at [facility] with neurosurgeon and she will start getting pain management through [facility] she will return the scripts today that she was given in January,. pls return them to me when she does

## 2016-06-06 ENCOUNTER — Other Ambulatory Visit: Payer: Self-pay | Admitting: Family Medicine

## 2016-06-07 ENCOUNTER — Other Ambulatory Visit: Payer: Self-pay | Admitting: Family Medicine

## 2016-06-07 ENCOUNTER — Telehealth: Payer: Self-pay | Admitting: Family Medicine

## 2016-06-07 MED ORDER — OXYCODONE ER 18 MG PO C12A: 1.0000 | EXTENDED_RELEASE_CAPSULE | Freq: Two times a day (BID) | ORAL | 0 refills | Status: DC

## 2016-06-07 NOTE — Telephone Encounter (Signed)
pls verify that it is due before giving her

## 2016-06-07 NOTE — Telephone Encounter (Signed)
Patient aware.  Med is due.  She will have sister to come up and collect.

## 2016-06-07 NOTE — Telephone Encounter (Signed)
Angela [name] is stating that she is not able to get into the pain clinic at [facility] for another 2-3 wks and they are asking if Dr Moshe Cipro could write this months pain Rx until she can be seen, she is totally out she brought Rx's back to Dr. Moshe Cipro

## 2016-06-07 NOTE — Telephone Encounter (Signed)
Will re  Write the same medication she was recently prescribed at same dose , she can collect one month supply

## 2016-06-09 DIAGNOSIS — J45909 Unspecified asthma, uncomplicated: Secondary | ICD-10-CM | POA: Diagnosis not present

## 2016-06-23 DIAGNOSIS — Z79891 Long term (current) use of opiate analgesic: Secondary | ICD-10-CM | POA: Diagnosis not present

## 2016-06-23 DIAGNOSIS — G894 Chronic pain syndrome: Secondary | ICD-10-CM | POA: Diagnosis not present

## 2016-06-23 DIAGNOSIS — M5137 Other intervertebral disc degeneration, lumbosacral region: Secondary | ICD-10-CM | POA: Diagnosis not present

## 2016-06-23 DIAGNOSIS — M62830 Muscle spasm of back: Secondary | ICD-10-CM | POA: Diagnosis not present

## 2016-06-23 DIAGNOSIS — Z79899 Other long term (current) drug therapy: Secondary | ICD-10-CM | POA: Diagnosis not present

## 2016-06-23 DIAGNOSIS — M545 Low back pain: Secondary | ICD-10-CM | POA: Diagnosis not present

## 2016-06-23 DIAGNOSIS — M961 Postlaminectomy syndrome, not elsewhere classified: Secondary | ICD-10-CM | POA: Diagnosis not present

## 2016-07-04 DIAGNOSIS — H40013 Open angle with borderline findings, low risk, bilateral: Secondary | ICD-10-CM | POA: Diagnosis not present

## 2016-07-05 ENCOUNTER — Other Ambulatory Visit: Payer: Self-pay | Admitting: Family Medicine

## 2016-07-07 ENCOUNTER — Other Ambulatory Visit: Payer: Self-pay | Admitting: Family Medicine

## 2016-07-07 DIAGNOSIS — J45909 Unspecified asthma, uncomplicated: Secondary | ICD-10-CM | POA: Diagnosis not present

## 2016-07-18 DIAGNOSIS — Z888 Allergy status to other drugs, medicaments and biological substances status: Secondary | ICD-10-CM | POA: Diagnosis not present

## 2016-07-18 DIAGNOSIS — I10 Essential (primary) hypertension: Secondary | ICD-10-CM | POA: Diagnosis not present

## 2016-07-18 DIAGNOSIS — Z885 Allergy status to narcotic agent status: Secondary | ICD-10-CM | POA: Diagnosis not present

## 2016-07-18 DIAGNOSIS — M5137 Other intervertebral disc degeneration, lumbosacral region: Secondary | ICD-10-CM | POA: Diagnosis not present

## 2016-07-18 DIAGNOSIS — Z7984 Long term (current) use of oral hypoglycemic drugs: Secondary | ICD-10-CM | POA: Diagnosis not present

## 2016-07-18 DIAGNOSIS — M545 Low back pain: Secondary | ICD-10-CM | POA: Diagnosis not present

## 2016-07-18 DIAGNOSIS — G4730 Sleep apnea, unspecified: Secondary | ICD-10-CM | POA: Diagnosis not present

## 2016-07-18 DIAGNOSIS — G894 Chronic pain syndrome: Secondary | ICD-10-CM | POA: Diagnosis not present

## 2016-07-18 DIAGNOSIS — E7800 Pure hypercholesterolemia, unspecified: Secondary | ICD-10-CM | POA: Diagnosis not present

## 2016-07-18 DIAGNOSIS — Z886 Allergy status to analgesic agent status: Secondary | ICD-10-CM | POA: Diagnosis not present

## 2016-07-18 DIAGNOSIS — Z79899 Other long term (current) drug therapy: Secondary | ICD-10-CM | POA: Diagnosis not present

## 2016-07-19 ENCOUNTER — Encounter: Payer: Self-pay | Admitting: Family Medicine

## 2016-07-19 ENCOUNTER — Ambulatory Visit (INDEPENDENT_AMBULATORY_CARE_PROVIDER_SITE_OTHER): Payer: Medicare Other | Admitting: Family Medicine

## 2016-07-19 VITALS — BP 124/78 | HR 90 | Resp 18 | Ht 64.0 in | Wt 319.0 lb

## 2016-07-19 DIAGNOSIS — E559 Vitamin D deficiency, unspecified: Secondary | ICD-10-CM | POA: Diagnosis not present

## 2016-07-19 DIAGNOSIS — F418 Other specified anxiety disorders: Secondary | ICD-10-CM | POA: Diagnosis not present

## 2016-07-19 DIAGNOSIS — I10 Essential (primary) hypertension: Secondary | ICD-10-CM

## 2016-07-19 DIAGNOSIS — E785 Hyperlipidemia, unspecified: Secondary | ICD-10-CM

## 2016-07-19 DIAGNOSIS — E119 Type 2 diabetes mellitus without complications: Secondary | ICD-10-CM

## 2016-07-19 DIAGNOSIS — G8929 Other chronic pain: Secondary | ICD-10-CM | POA: Diagnosis not present

## 2016-07-19 MED ORDER — OXYCODONE HCL ER 40 MG PO T12A: 40.0000 mg | EXTENDED_RELEASE_TABLET | Freq: Two times a day (BID) | ORAL | 0 refills | Status: DC

## 2016-07-19 MED ORDER — DULOXETINE HCL 60 MG PO CPEP: 60.0000 mg | ORAL_CAPSULE | Freq: Two times a day (BID) | ORAL | 3 refills | Status: DC

## 2016-07-19 NOTE — Patient Instructions (Addendum)
f/u in April as before  New pain contract once again in this clinic and I will notify clinic in [place]. Fill script when current pain medication is finished , we are verifying the due date  Eat in disciplined fashion so that you lose weight THAT IS HOW your pain will improve and health improve  New is cymbalta for pain and depression , 60 mg one twice daily  Fasting labs 3 days before next visit please  YOU WILL IMPROVE I KNOW THIS!  Thank you  for choosing [facility]. We consider it a privelige to serve you.  Delivering excellent health care in a caring and  compassionate way is our goal.  Partnering with you,  so that together we can achieve this goal is our strategy.

## 2016-07-22 NOTE — Progress Notes (Signed)
Angela [name]     MRN: [id number]      DOB: 16-Feb-1961   HPI Angela [name] is here for for chronic pain management. She has been to a pain clinic in [place], and unfortunately her management options as far as medication failed, she has no interest in epidural injections and surgery is  Not an option She is tearful and stressed, she has tried  2 preferred pain meds on her new insurance with no success, so she can now resume oxycodone which she has been maintained on for years She understands the NEED to lose weight  to reduce pain and improve quality of life Feels overwhelmed and depressed, states she "is tired" not suicidal or homicidal, has good family support ROS Denies recent fever or chills. Denies sinus pressure, nasal congestion, ear pain or sore throat. Denies chest congestion, productive cough or wheezing. Denies chest pains, palpitations and leg swelling Denies abdominal pain, nausea, vomiting,diarrhea or constipation.   Denies dysuria, frequency, hesitancy or incontinence.  Denies skin break down or rash.   PE  BP 124/78   Pulse 90   Resp 18   Ht 5\' 4"  (1.626 m)   Wt (!) 319 lb (144.7 kg)   SpO2 96%   BMI 54.76 kg/m   Patient alert and oriented and in no cardiopulmonary distress.Crying , tearful, anxious and in pain HEENT: No facial asymmetry, EOMI,   oropharynx pink and moist.  Neck supple no JVD, no mass.  Chest: Clear to auscultation bilaterally.  CVS: S1, S2 no murmurs, no S3.Regular rate.  ABD: Soft non tender.   Ext: No edema  MS: decreased  ROM spine, adequate in shoulders, decreased in  hips and knees.  Skin: Intact, no ulcerations or rash noted.    CNS: CN 2-12 intact, power,  normal throughout.no focal deficits noted.   Assessment & Plan  Encounter for chronic pain management Re establishing with PCP for chronic pain management. Has been to pain clinic in [place] , has tried the required two alternative pain pills , they are not effective, does  not want epidural injections and states she got best pain relief from her old script and wants to resume this locally.Saw  Her pain specialist in [place] earlier this week and they are aware of her decision Pain contract reviewed and signed, new dispense of medication is when her current medication dispensed is finished , her med profile is printed and reviewed  Morbid obesity Deteriorated. Patient re-educated about  the importance of commitment to a  minimum of 150 minutes of exercise per week.  The importance of healthy food choices with portion control discussed. Encouraged to start a food diary, count calories and to consider  joining a support group. Sample diet sheets offered. Goals set by the patient for the next several months.   Weight /BMI 07/19/2016 05/03/2016 04/20/2016  WEIGHT 319 lb 320 lb 315 lb  HEIGHT 5\' 4"  5\' 4"  5\' 4"   BMI 54.76 kg/m2 54.93 kg/m2 54.07 kg/m2   I explained clearly that her best and only non surgical  option for pain relief is weight loss , she understands and agrees, will work on dietary control initially   Essential hypertension Controlled, no change in medication DASH diet and commitment to daily physical activity for a minimum of 30 minutes discussed and encouraged, as a part of hypertension management. The importance of attaining a healthy weight is also discussed.  BP/Weight 07/19/2016 05/03/2016 04/20/2016 03/09/2016 02/14/2016 02/02/2016 08/01/4740  Systolic BP 595 638  160 140 081 388 719  Diastolic BP 78 84 84 84 99 98 92  Wt. (Lbs) 319 320 315 295 - 319.2 310  BMI 54.76 54.93 54.07 50.64 - 54.79 53.21       Depression with anxiety Increased depression due to weight gain and uncontrolled pain, has family support, not suicidal or homicidal, dose increase in cymbalta to also assist with pain mangement  \

## 2016-07-22 NOTE — Assessment & Plan Note (Signed)
Re establishing with PCP for chronic pain management. Has been to pain clinic in [place] , has tried the required two alternative pain pills , they are not effective, does not want epidural injections and states she got best pain relief from her old script and wants to resume this locally.Saw  Her pain specialist in [place] earlier this week and they are aware of her decision Pain contract reviewed and signed, new dispense of medication is when her current medication dispensed is finished , her med profile is printed and reviewed

## 2016-07-22 NOTE — Assessment & Plan Note (Signed)
Increased depression due to weight gain and uncontrolled pain, has family support, not suicidal or homicidal, dose increase in cymbalta to also assist with pain mangement

## 2016-07-22 NOTE — Assessment & Plan Note (Signed)
Controlled, no change in medication DASH diet and commitment to daily physical activity for a minimum of 30 minutes discussed and encouraged, as a part of hypertension management. The importance of attaining a healthy weight is also discussed.  BP/Weight 07/19/2016 05/03/2016 04/20/2016 03/09/2016 02/14/2016 02/02/2016 1/61/0960  Systolic BP 454 098 119 147 829 562 130  Diastolic BP 78 84 84 84 99 98 92  Wt. (Lbs) 319 320 315 295 - 319.2 310  BMI 54.76 54.93 54.07 50.64 - 54.79 53.21

## 2016-07-22 NOTE — Assessment & Plan Note (Signed)
Deteriorated. Patient re-educated about  the importance of commitment to a  minimum of 150 minutes of exercise per week.  The importance of healthy food choices with portion control discussed. Encouraged to start a food diary, count calories and to consider  joining a support group. Sample diet sheets offered. Goals set by the patient for the next several months.   Weight /BMI 07/19/2016 05/03/2016 04/20/2016  WEIGHT 319 lb 320 lb 315 lb  HEIGHT 5\' 4"  5\' 4"  5\' 4"   BMI 54.76 kg/m2 54.93 kg/m2 54.07 kg/m2   I explained clearly that her best and only non surgical  option for pain relief is weight loss , she understands and agrees, will work on dietary control initially

## 2016-07-26 ENCOUNTER — Ambulatory Visit: Payer: Medicare Other | Admitting: Adult Health

## 2016-07-31 ENCOUNTER — Ambulatory Visit: Payer: Medicare Other | Admitting: Adult Health

## 2016-08-02 ENCOUNTER — Ambulatory Visit: Payer: Medicare Other | Admitting: Adult Health

## 2016-08-07 ENCOUNTER — Other Ambulatory Visit: Payer: Self-pay | Admitting: Family Medicine

## 2016-08-07 DIAGNOSIS — J45909 Unspecified asthma, uncomplicated: Secondary | ICD-10-CM | POA: Diagnosis not present

## 2016-08-08 ENCOUNTER — Ambulatory Visit: Payer: Medicare Other | Admitting: Gastroenterology

## 2016-08-10 ENCOUNTER — Telehealth: Payer: Self-pay | Admitting: Family Medicine

## 2016-08-10 NOTE — Telephone Encounter (Signed)
Sent to pharmacy 

## 2016-08-10 NOTE — Telephone Encounter (Signed)
Tiphani is asking for a refill on zolpidem (AMBIEN) 10 MG tablet and diazepam (VALIUM) 5 MG tablet sent to [facility] , please advise?

## 2016-08-16 ENCOUNTER — Telehealth: Payer: Self-pay

## 2016-08-16 NOTE — Telephone Encounter (Signed)
Her pain doctor advised her NOT to resume oxycodone and I dont see documentation in the chart from Dr simpson indicating whey she chose not to follow this instruction.  I will not give it to her.  She can space out her pills until she comes in so she does not withdraw.

## 2016-08-16 NOTE — Telephone Encounter (Signed)
Pt in on 3/21. Was seeing a pain clinic but wanted dr simpson to take over pain management. 1 month given and told to keep her next appt on 4/25. Pt states she runs out on 4/20. What do I need to do?

## 2016-08-16 NOTE — Telephone Encounter (Signed)
See below message. Patient aware and states she runs out Saturday.

## 2016-08-21 ENCOUNTER — Encounter: Payer: Self-pay | Admitting: Family Medicine

## 2016-08-21 ENCOUNTER — Ambulatory Visit (INDEPENDENT_AMBULATORY_CARE_PROVIDER_SITE_OTHER): Payer: Medicare Other | Admitting: Family Medicine

## 2016-08-21 VITALS — BP 138/82 | HR 86 | Resp 16 | Ht 64.0 in | Wt 318.0 lb

## 2016-08-21 DIAGNOSIS — E559 Vitamin D deficiency, unspecified: Secondary | ICD-10-CM | POA: Diagnosis not present

## 2016-08-21 DIAGNOSIS — I10 Essential (primary) hypertension: Secondary | ICD-10-CM

## 2016-08-21 DIAGNOSIS — R200 Anesthesia of skin: Secondary | ICD-10-CM | POA: Insufficient documentation

## 2016-08-21 DIAGNOSIS — E119 Type 2 diabetes mellitus without complications: Secondary | ICD-10-CM | POA: Diagnosis not present

## 2016-08-21 DIAGNOSIS — E785 Hyperlipidemia, unspecified: Secondary | ICD-10-CM | POA: Diagnosis not present

## 2016-08-21 DIAGNOSIS — G8929 Other chronic pain: Secondary | ICD-10-CM | POA: Diagnosis not present

## 2016-08-21 DIAGNOSIS — F418 Other specified anxiety disorders: Secondary | ICD-10-CM | POA: Diagnosis not present

## 2016-08-21 MED ORDER — OXYCODONE HCL ER 40 MG PO T12A: 40.0000 mg | EXTENDED_RELEASE_TABLET | Freq: Two times a day (BID) | ORAL | 0 refills | Status: DC

## 2016-08-21 NOTE — Progress Notes (Signed)
Angela [name]     MRN: [id number]      DOB: 1961/03/25   HPI Angela [name] is here  In particular pain management and diabetes medication management and review of any available recent lab and radiology data. She will have labs today. She will have message sent re labs and will cancel appt in 2 days as she is out of her chronic pain meds. She is not being treated at Pain clinic in [place] anymore and has opted to return to local office as she reported "bad reaction " to medications she was tried on. Trying to work on weight loss and has lost 1 pound since past 1 month Preventive health is updated, specifically  Cancer screening and Immunization.  Has an upcoming appt with GI for her colonoscopy 4 week h/o tingling in left hand, states "all her nerves are being damaged", will refer her to neurology for further evaluation Questions or concerns regarding consultations or procedures which the PT has had in the interim are  addressed. States pain management on current regime is adequate and she is definitely going to do the best she can with her this medication as she is able to function with it and the side effects are tolerable   ROS Denies recent fever or chills. Denies sinus pressure, nasal congestion, ear pain or sore throat. Denies chest congestion, productive cough or wheezing. Denies chest pains, palpitations and leg swelling Denies abdominal pain, nausea, vomiting,diarrhea or constipation.   Denies dysuria, frequency, hesitancy or incontinence. Denies uncontrolled  joint pain, swelling and limitation in mobility. Denies headaches, seizures,  Denies depression, anxiety or insomnia. Denies skin break down or rash.   PE  BP 138/82   Pulse 86   Resp 16   Ht 5\' 4"  (1.626 m)   Wt (!) 318 lb (144.2 kg)   SpO2 97%   BMI 54.58 kg/m   Patient alert and oriented and in no cardiopulmonary distress.  HEENT: No facial asymmetry, EOMI,   oropharynx pink and moist.  Neck supple no JVD, no  mass.  Chest: Clear to auscultation bilaterally.  CVS: S1, S2 no murmurs, no S3.Regular rate.  ABD: Soft non tender.   Ext: No edema  MS: Adequate though reduced  ROM spine, shoulders, hips and knees.  Skin: Intact, no ulcerations or rash noted.  Psych: Good eye contact, normal affect. Memory intact not anxious or depressed appearing.  CNS: CN 2-12 intact, power,  normal throughout.no focal deficits noted.   Assessment & Plan  Numbness of left hand 1 month h/o left hand numbness, refer neurology for eval states its  Her nerves just all getting bad", was referred in the past for mRI C spine in the past and did not follow through  Encounter for chronic pain management Chronic pain management reviewed reports adequate control and level of function Registry reviewed, 3 months of medication prescribed to be filled today, 30 days each time  Essential hypertension Controlled, no change in medication DASH diet and commitment to daily physical activity for a minimum of 30 minutes discussed and encouraged, as a part of hypertension management. The importance of attaining a healthy weight is also discussed.  BP/Weight 08/21/2016 07/19/2016 05/03/2016 04/20/2016 03/09/2016 02/14/2016 84/05/6604  Systolic BP 301 601 093 235 573 220 254  Diastolic BP 82 78 84 84 84 99 98  Wt. (Lbs) 318 319 320 315 295 - 319.2  BMI 54.58 54.76 54.93 54.07 50.64 - 54.79       Depression with  anxiety Controlled, no change in medication   Hyperlipidemia LDL goal <70 Hyperlipidemia:Low fat diet discussed and encouraged.   Lipid Panel  Lab Results  Component Value Date   CHOL 144 04/19/2016   HDL 46 (L) 04/19/2016   LDLCALC 82 04/19/2016   TRIG 78 04/19/2016   CHOLHDL 3.1 04/19/2016   Needs to increase exercise to improve HDL and needs lower LDL   Diabetes mellitus without complication Angela [name] is reminded of the importance of commitment to daily physical activity for 30 minutes or more, as  able and the need to limit carbohydrate intake to 30 to 60 grams per meal to help with blood sugar control.   The need to take medication as prescribed, test blood sugar as directed, and to call between visits if there is a concern that blood sugar is uncontrolled is also discussed.   Angela [name] is reminded of the importance of daily foot exam, annual eye examination, and good blood sugar, blood pressure and cholesterol control. Updated lab today   Diabetic Labs Latest Ref Rng & Units 04/21/2016 04/19/2016 07/27/2015 10/14/2014 08/31/2014  HbA1c <5.7 % - 6.7(H) 6.6(H) - 7.1(H)  Microalbumin Not Estab. ug/mL 41.2(H) - - - 1.1  Micro/Creat Ratio 0.0 - 30.0 mg/g creat 22.2 - - - 7.0  Chol <200 mg/dL - 144 145 - 173  HDL >50 mg/dL - 46(L) 48 - 44(L)  Calc LDL <100 mg/dL - 82 81 - 113(H)  Triglycerides <150 mg/dL - 78 80 - 82  Creatinine 0.50 - 1.05 mg/dL - 0.76 0.75 0.84 0.78   BP/Weight 08/21/2016 07/19/2016 05/03/2016 04/20/2016 03/09/2016 02/14/2016 24/11/1857  Systolic BP 093 112 162 446 950 722 575  Diastolic BP 82 78 84 84 84 99 98  Wt. (Lbs) 318 319 320 315 295 - 319.2  BMI 54.58 54.76 54.93 54.07 50.64 - 54.79   Foot/eye exam completion dates Latest Ref Rng & Units 03/07/2016 09/14/2015  Eye Exam No Retinopathy No Retinopathy -  Foot Form Completion - - Done

## 2016-08-21 NOTE — Telephone Encounter (Signed)
Noted, seen in office today

## 2016-08-21 NOTE — Assessment & Plan Note (Signed)
Chronic pain management reviewed reports adequate control and level of function Registry reviewed, 3 months of medication prescribed to be filled today, 30 days each time

## 2016-08-21 NOTE — Assessment & Plan Note (Signed)
Controlled, no change in medication DASH diet and commitment to daily physical activity for a minimum of 30 minutes discussed and encouraged, as a part of hypertension management. The importance of attaining a healthy weight is also discussed.  BP/Weight 08/21/2016 07/19/2016 05/03/2016 04/20/2016 03/09/2016 02/14/2016 39/05/2256  Systolic BP 346 219 471 252 712 929 090  Diastolic BP 82 78 84 84 84 99 98  Wt. (Lbs) 318 319 320 315 295 - 319.2  BMI 54.58 54.76 54.93 54.07 50.64 - 54.79

## 2016-08-21 NOTE — Assessment & Plan Note (Addendum)
Hyperlipidemia:Low fat diet discussed and encouraged.   Lipid Panel  Lab Results  Component Value Date   CHOL 144 04/19/2016   HDL 46 (L) 04/19/2016   LDLCALC 82 04/19/2016   TRIG 78 04/19/2016   CHOLHDL 3.1 04/19/2016   Needs to increase exercise to improve HDL and needs lower LDL

## 2016-08-21 NOTE — Assessment & Plan Note (Addendum)
1 month h/o left hand numbness, refer neurology for eval states its  Her nerves just all getting bad", was referred in the past for mRI C spine in the past and did not follow through

## 2016-08-21 NOTE — Assessment & Plan Note (Signed)
Angela [name] is reminded of the importance of commitment to daily physical activity for 30 minutes or more, as able and the need to limit carbohydrate intake to 30 to 60 grams per meal to help with blood sugar control.   The need to take medication as prescribed, test blood sugar as directed, and to call between visits if there is a concern that blood sugar is uncontrolled is also discussed.   Angela [name] is reminded of the importance of daily foot exam, annual eye examination, and good blood sugar, blood pressure and cholesterol control. Updated lab today   Diabetic Labs Latest Ref Rng & Units 04/21/2016 04/19/2016 07/27/2015 10/14/2014 08/31/2014  HbA1c <5.7 % - 6.7(H) 6.6(H) - 7.1(H)  Microalbumin Not Estab. ug/mL 41.2(H) - - - 1.1  Micro/Creat Ratio 0.0 - 30.0 mg/g creat 22.2 - - - 7.0  Chol <200 mg/dL - 144 145 - 173  HDL >50 mg/dL - 46(L) 48 - 44(L)  Calc LDL <100 mg/dL - 82 81 - 113(H)  Triglycerides <150 mg/dL - 78 80 - 82  Creatinine 0.50 - 1.05 mg/dL - 0.76 0.75 0.84 0.78   BP/Weight 08/21/2016 07/19/2016 05/03/2016 04/20/2016 03/09/2016 02/14/2016 61/11/4857  Systolic BP 276 394 320 037 944 461 901  Diastolic BP 82 78 84 84 84 99 98  Wt. (Lbs) 318 319 320 315 295 - 319.2  BMI 54.58 54.76 54.93 54.07 50.64 - 54.79   Foot/eye exam completion dates Latest Ref Rng & Units 03/07/2016 09/14/2015  Eye Exam No Retinopathy No Retinopathy -  Foot Form Completion - - Done

## 2016-08-21 NOTE — Assessment & Plan Note (Signed)
Controlled, no change in medication  

## 2016-08-21 NOTE — Patient Instructions (Addendum)
f/u in 12 weeks, call if you need me sooner PLEASE cancel appointment for 08/23/2016 [place] on 1 pound weight loss.  Weight loss goal of 6 to 10 pounds  Please get colonoscopy  You are referred to [facility] re left hand numbness for evaluation  Three months of pain medication in prescribed today  I will notify Dr Jimmye Norman that you have decided to resume pain management locally once more.  Labs today and I will send you a my chart message on results

## 2016-08-22 ENCOUNTER — Encounter: Payer: Self-pay | Admitting: Family Medicine

## 2016-08-22 ENCOUNTER — Other Ambulatory Visit: Payer: Self-pay | Admitting: Family Medicine

## 2016-08-22 LAB — LIPID PANEL
Cholesterol: 167 mg/dL (ref <200)
HDL: 50 mg/dL — ABNORMAL LOW (ref >50)
LDL CALC: 96 mg/dL (ref <100)
TRIGLYCERIDES: 107 mg/dL (ref <150)
Total CHOL/HDL Ratio: 3.3 Ratio (ref <5.0)
VLDL: 21 mg/dL (ref <30)

## 2016-08-22 LAB — COMPLETE METABOLIC PANEL WITH GFR
ALK PHOS: 75 U/L (ref 33–130)
ALT: 21 U/L (ref 6–29)
AST: 18 U/L (ref 10–35)
Albumin: 4.5 g/dL (ref 3.6–5.1)
BUN: 13 mg/dL (ref 7–25)
CALCIUM: 10 mg/dL (ref 8.6–10.4)
CHLORIDE: 97 mmol/L — AB (ref 98–110)
CO2: 29 mmol/L (ref 20–31)
Creat: 0.69 mg/dL (ref 0.50–1.05)
Glucose, Bld: 113 mg/dL — ABNORMAL HIGH (ref 65–99)
POTASSIUM: 4.4 mmol/L (ref 3.5–5.3)
Sodium: 136 mmol/L (ref 135–146)
Total Bilirubin: 0.3 mg/dL (ref 0.2–1.2)
Total Protein: 8.1 g/dL (ref 6.1–8.1)

## 2016-08-22 LAB — VITAMIN D 25 HYDROXY (VIT D DEFICIENCY, FRACTURES): VIT D 25 HYDROXY: 13 ng/mL — AB (ref 30–100)

## 2016-08-22 LAB — HEMOGLOBIN A1C
HEMOGLOBIN A1C: 6.8 % — AB (ref <5.7)
MEAN PLASMA GLUCOSE: 148 mg/dL

## 2016-08-22 LAB — TSH: TSH: 3.63 mIU/L

## 2016-08-22 MED ORDER — ERGOCALCIFEROL 1.25 MG (50000 UT) PO CAPS: 50000.0000 [IU] | ORAL_CAPSULE | ORAL | 0 refills | Status: DC

## 2016-08-22 MED ORDER — ERGOCALCIFEROL 1.25 MG (50000 UT) PO CAPS: 50000.0000 [IU] | ORAL_CAPSULE | ORAL | 1 refills | Status: DC

## 2016-08-23 ENCOUNTER — Ambulatory Visit: Payer: Self-pay | Admitting: Family Medicine

## 2016-08-28 ENCOUNTER — Encounter: Payer: Self-pay | Admitting: Women's Health

## 2016-08-28 ENCOUNTER — Ambulatory Visit (INDEPENDENT_AMBULATORY_CARE_PROVIDER_SITE_OTHER): Payer: Medicare Other | Admitting: Women's Health

## 2016-08-28 VITALS — BP 152/80 | HR 86 | Ht 64.0 in | Wt 316.5 lb

## 2016-08-28 DIAGNOSIS — R1031 Right lower quadrant pain: Secondary | ICD-10-CM

## 2016-08-28 LAB — POCT WET PREP (WET MOUNT)
CLUE CELLS WET PREP WHIFF POC: NEGATIVE
TRICHOMONAS WET PREP HPF POC: ABSENT

## 2016-08-28 NOTE — Progress Notes (Signed)
   [facility] Visit  Patient name: Angela [name] MRN [id number]  Date of birth: 04/12/1961  CC & HPI:  Angela [name] is a 56 y.o. [phone] African American female presenting today for report of intermittent cramping/spasming type pain lower abdomen x 6-7wks. Lasts for about 55mins when it comes, also brings on sweats. Eventually goes away on its own. Points to area of pain in Rt lower side of large panus- not in lower pelvic region. Denies vaginal bleeding, abnormal vaginal discharge, itching/odor/irritation. Not sexually active x 23yrs. S/P hysterectomy and Lt oophorectomy 'wasn't having periods and they found a cyst behind my ovary'.  Normal bm's, no change in habits, denies diarrhea/constipation. Denies uti sx.  No LMP recorded. Patient has had a hysterectomy.  Pertinent History Reviewed:  Medical & Surgical Hx:   Past medical, surgical, family, and social history reviewed in electronic medical record Medications: Reviewed & Updated - see associated section Allergies: Reviewed in electronic medical record  Objective Findings:  Vitals: BP (!) 152/80 (BP Location: Left Arm, Patient Position: Sitting, Cuff Size: Large)   Pulse 86   Ht 5\' 4"  (1.626 m)   Wt (!) 316 lb 8 oz (143.6 kg)   BMI 54.33 kg/m  Body mass index is 54.33 kg/m.  Physical Examination: General appearance - alert, well appearing, and in no distress Abdomen - large panus, difficult to adequately assess. Some tenderness to deep palpation of Rt lower abdomen through panus, no tenderness in center or on Lt Pelvic - normal external genitalia, spec exam- normal vagina, no abnormal d/c, no odor, cx surgically absent; bimanual- unable to adequately assess d/t body habitus- but pt does report tenderness to palpation of Rt adnexal region  Results for orders placed or performed in visit on 08/28/16 (from the past 24 hour(s))  POCT Wet Prep Len[facility] [place])   Collection Time: 08/28/16  3:58 PM  Result Value Ref Range   Source  Wet Prep POC vaginal    WBC, Wet Prep HPF POC none    Bacteria Wet Prep HPF POC None (A) Few   BACTERIA WET PREP MORPHOLOGY POC     Clue Cells Wet Prep HPF POC None None   Clue Cells Wet Prep Whiff POC Negative Whiff    Yeast Wet Prep HPF POC None    KOH Wet Prep POC     Trichomonas Wet Prep HPF POC Absent Absent   *Note: Due to a large number of results and/or encounters for the requested time period, some results have not been displayed. A complete set of results can be found in Results Review.     Assessment & Plan:  A:   Rt lower abdominal/pelvic pain  S/P hysterectomy w/ Lt oophorectomy   P:  Discussed getting pelvic u/s- pt wants to do  Return for within the next week for pelvic u/s and f/u w/ me.  Tawnya Crook CNM, [name] 08/28/2016 3:59 PM

## 2016-09-01 ENCOUNTER — Other Ambulatory Visit: Payer: Self-pay

## 2016-09-01 ENCOUNTER — Ambulatory Visit (INDEPENDENT_AMBULATORY_CARE_PROVIDER_SITE_OTHER): Payer: Medicare Other | Admitting: Gastroenterology

## 2016-09-01 ENCOUNTER — Other Ambulatory Visit: Payer: Self-pay | Admitting: Women's Health

## 2016-09-01 ENCOUNTER — Encounter: Payer: Self-pay | Admitting: Gastroenterology

## 2016-09-01 DIAGNOSIS — Z800 Family history of malignant neoplasm of digestive organs: Secondary | ICD-10-CM

## 2016-09-01 DIAGNOSIS — K2970 Gastritis, unspecified, without bleeding: Secondary | ICD-10-CM | POA: Diagnosis not present

## 2016-09-01 DIAGNOSIS — R1031 Right lower quadrant pain: Secondary | ICD-10-CM

## 2016-09-01 DIAGNOSIS — B9681 Helicobacter pylori [H. pylori] as the cause of diseases classified elsewhere: Secondary | ICD-10-CM | POA: Diagnosis not present

## 2016-09-01 MED ORDER — CLENPIQ 10-3.5-12 MG-GM -GM/160ML PO SOLN: 1.0000 | Freq: Once | ORAL | 0 refills | Status: AC

## 2016-09-01 NOTE — Progress Notes (Addendum)
REVIEWED-NO ADDITIONAL RECOMMENDATIONS.  Primary Care Physician:  [name], MD  Primary Gastroenterologist:  Barney Drain, MD   Chief Complaint  Patient presents with  . Colonoscopy    HPI:  Angela [name] is a 56 y.o. female here to schedule her colonoscopy. She canceled back in 2016 because she was vomiting the night before the procedure. Questionable family history of colon cancer, father but she is not sure if it was colon cancer or prostate cancer. Bowel movements have been regular. No blood in the stool or melena. Only issues of concern is right lower quadrant crampy pain sometimes severe and associated with diaphoresis. Currently being worked up by [employer] with pending ultrasound. No upper GI symptoms on Nexium.  Patient has h/o H.pylori gastritis in the past. Never completed her H.pylori stool antigen in 2013 to show eradication.    Current Outpatient Prescriptions  Medication Sig Dispense Refill  . albuterol (PROAIR HFA) 108 (90 BASE) MCG/ACT inhaler Inhale 2 puffs into the lungs every 6 (six) hours as needed for wheezing or shortness of breath.    Marland Kitchen amLODipine (NORVASC) 10 MG tablet TAKE 1 TABLET BY MOUTH EVERY DAY 90 tablet 1  . atorvastatin (LIPITOR) 80 MG tablet TAKE 1 TABLET BY MOUTH EVERY DAY 90 tablet 1  . canagliflozin (INVOKANA) 300 MG TABS tablet Take 300 mg by mouth daily before breakfast. 30 tablet 4  . cloNIDine (CATAPRES) 0.3 MG tablet TAKE ONE TABLET AT 9 PM EVERY NIGHT FOR HIGH BLOOD PRESSURE 90 tablet 1  . cyclobenzaprine (FLEXERIL) 10 MG tablet TAKE 1 TABLET BY MOUTH 3 TIMES A DAY 90 tablet 3  . diazepam (VALIUM) 5 MG tablet TAKE 1 TABLET BY MOUTH 3 TIMES A DAY 90 tablet 2  . DULoxetine (CYMBALTA) 60 MG capsule Take 1 capsule (60 mg total) by mouth 2 (two) times daily. 60 capsule 3  . ergocalciferol (VITAMIN D2) 50000 units capsule Take 1 capsule (50,000 Units total) by mouth once a week. One capsule once weekly 12 capsule 0  . esomeprazole (NEXIUM) 40 MG  capsule TAKE 1 CAPSULE (40 MG TOTAL) BY MOUTH DAILY. 90 capsule 1  . fluticasone (FLONASE) 50 MCG/ACT nasal spray Place 2 sprays into both nostrils daily. (Patient taking differently: Place 2 sprays into both nostrils as needed. ) 16 g 6  . furosemide (LASIX) 40 MG tablet TAKE 1 TABLET BY MOUTH TWICE A DAY 180 tablet 1  . gabapentin (NEURONTIN) 800 MG tablet TAKE 1 TABLET BY MOUTH 4 TIMES A DAY 360 tablet 1  . ipratropium-albuterol (DUONEB) 0.5-2.5 (3) MG/3ML SOLN Take 3 mLs by nebulization every 6 (six) hours as needed. 360 mL 1  . KLOR-CON M20 20 MEQ tablet TAKE 1 TABLET (20 MEQ TOTAL) BY MOUTH 2 (TWO) TIMES DAILY. 180 tablet 1  . metFORMIN (GLUCOPHAGE) 1000 MG tablet TAKE 1 TABLET BY MOUTH TWICE A DAY WITH FOOD 180 tablet 1  . montelukast (SINGULAIR) 10 MG tablet TAKE 1 TABLET (10 MG TOTAL) BY MOUTH AT BEDTIME. 90 tablet 1  . NITROSTAT 0.4 MG SL tablet PLACE 1 TABLET UNDER TONGUE IF NEEDED FOR CHEST PAIN.Bennie Pierini OF 3 TIMES  11  . oxyCODONE (OXYCONTIN) 40 mg 12 hr tablet Take 1 tablet (40 mg total) by mouth every 12 (twelve) hours. 60 tablet 0  . PROAIR HFA 108 (90 BASE) MCG/ACT inhaler INHALE 2 PUFFS BY MOUTH EVERY 6-8HRS AS NEEDED 8.5 Inhaler 1  . SYMBICORT 160-4.5 MCG/ACT inhaler INHALE 2 PUUFS BY MOUTH TWICE DAILY 10.2 Inhaler 5  .  triamcinolone cream (KENALOG) 0.1 % Apply 1 application topically 2 (two) times daily. 60 g 0  . zolpidem (AMBIEN) 10 MG tablet TAKE 1 TABLET BY MOUTH AT BEDTIME 30 tablet 2   No current facility-administered medications for this visit.     Allergies as of 09/01/2016 - Review Complete 09/01/2016  Allergen Reaction Noted  . Morphine Other (See Comments) 07/23/2007  . Ace inhibitors Cough 04/01/2013  . Aspirin Other (See Comments) 07/23/2007  . Losartan Cough 04/01/2013  . Tapentadol Other (See Comments) 07/18/2016  . Tomato Other (See Comments) 07/13/2013  . Adhesive [tape] Rash 08/30/2012    Past Medical History:  Diagnosis Date  . Anxiety   . Arthritis    . Asthma   . Coronary atherosclerosis of native coronary artery    Mild nonobstructive CAD by cardiac catheterization 2008 - Dr. Terrence Dupont  . Degenerative disc disease   . Depression   . Dysrhythmia   . Essential hypertension, benign   . Folliculitis 05/19/1476  . GERD (gastroesophageal reflux disease)   . H/O hiatal hernia   . Helicobacter pylori gastritis 2008  . Hypercholesterolemia   . Hypertension   . Hyperthyroidism    s/p radiation  . Low back pain   . Migraine   . Nephrolithiasis    Recurring episodes since 2004  . Severe obesity (BMI >= 40) (HCC)   . Sleep apnea    STOP BANG SCORE 6no cpap used  . Type 2 diabetes mellitus with diabetic neuropathy [facility])     Past Surgical History:  Procedure Laterality Date  . ABDOMINAL HYSTERECTOMY    . BACK SURGERY  1993  . BACK SURGERY  1994  . BACK SURGERY  2002  . BACK SURGERY  2011   [place]  . COLONOSCOPY  2000 [facility]   INT HEMORRHOIDS/FISSURE  . COLONOSCOPY  2003 [facility], CHANGE IN BOWEL HABITS   INT HEMORRHOIDS  . COLONOSCOPY  2006 [facility]   INT HEMORRHOIDS  . [facility]OSCOPY  [facility]   INT HEMORRHOIDS  . CYSTOSCOPY WITH STENT PLACEMENT Left 07/25/2012   Procedure: CYSTOSCOPY, left retograde pyelogram WITH left ureteral  STENT PLACEMENT;  Surgeon: Claybon Jabs, MD;  [facility];  Service: Urology;  Laterality: Left;  . CYSTOSCOPY/RETROGRADE/URETEROSCOPY  12/04/2011   Procedure: CYSTOSCOPY/RETROGRADE/URETEROSCOPY;  Surgeon: Molli Hazard, MD;  [facility];  Service: Urology;  Laterality: Right;  . CYSTOSCOPY/RETROGRADE/URETEROSCOPY/STONE EXTRACTION WITH BASKET Left 09/03/2012   Procedure: CYSTOSCOPY/RETROGRADE pyelogram/digital URETEROSCOPY/STONE EXTRACTION WITH BASKET, left stent removal;  Surgeon: Molli Hazard, MD;  [facility];  Service: Urology;  Laterality: Left;  . ESOPHAGOGASTRODUODENOSCOPY  2008   [id number] hiatal hernia./Normal esophagus without evidence of  Barrett's mass, stricture, erosion or ulceration./Normal duodenal bulb and second portion of the duodenum./Diffuse erythema in the body and the antrum with occasional erosion.  Biopsies obtained via cold forceps to evaluate for H. pylori gastritis  . FRACTURE SURGERY  1999   right clavicle  . HOLMIUM LASER APPLICATION Left 12/05/5782   Procedure: HOLMIUM LASER APPLICATION;  Surgeon: Molli Hazard, MD;  [facility];  Service: Urology;  Laterality: Left;  . KNEE ARTHROSCOPY  10/04/2010,    right knee arthroscopy, dr Theda Sers  . left knee arthroscopic surgery  1999  . Left salphingectomy secondary to ectopic pregnancy  1991  . PARTIAL HYSTERECTOMY  1991  . rotary cuff  Right 05/14/2014   [facility]  . UPPER GASTROINTESTINAL ENDOSCOPY  2008 abd pain  H. pylori gastritis  . UPPER GASTROINTESTINAL ENDOSCOPY  1996 [place]   PUD    Family History  Problem Relation Age of Onset  . Heart disease Mother   . Hypertension Mother   . Cancer Mother     Cervical   . Asthma Mother   . Heart attack Father   . Cancer Father     Prostate  . Colon cancer Father     DECEASED AGE 19  . Colon polyps Neg Hx     Social History   Social History  . Marital status: Single    Spouse name: N/A  . Number of children: 1  . Years of education: 27   Occupational History  . Disable     Social History Main Topics  . Smoking status: Never Smoker  . Smokeless tobacco: Never Used  . Alcohol use No  . Drug use: No  . Sexual activity: Not Currently    Partners: Male    Birth control/ protection: Surgical     Comment: hyst   Other Topics Concern  . Not on file   Social History Narrative   Drinks soda daily       ROS:  General: Negative for anorexia, weight loss, fever, chills, fatigue, weakness. Eyes: Negative for vision changes.  ENT: Negative for hoarseness, difficulty swallowing , nasal congestion. CV: Negative for chest pain, angina, palpitations, dyspnea on exertion,  peripheral edema.  Respiratory: Negative for dyspnea at rest, dyspnea on exertion, cough, sputum, wheezing.  GI: See history of present illness. GU:  Negative for dysuria, hematuria, urinary incontinence, urinary frequency, nocturnal urination.  MS: Negative for joint pain, low back pain.  Derm: Negative for rash or itching.  Neuro: Negative for weakness, abnormal sensation, seizure, frequent headaches, memory loss, confusion.  Psych: Negative for anxiety, depression, suicidal ideation, hallucinations.  Endo: Negative for unusual weight change.  Heme: Negative for bruising or bleeding. Allergy: Negative for rash or hives.    Physical Examination:  BP 131/73   Pulse 77   Temp 97.2 F (36.2 C) (Oral)   Ht 5\' 4"  (1.626 m)   Wt (!) 313 lb 3.2 oz (142.1 kg)   BMI 53.76 kg/m    General: Well-nourished, well-developed in no acute distress.  Head: Normocephalic, atraumatic.   Eyes: Conjunctiva pink, no icterus. Mouth: Oropharyngeal mucosa moist and pink , no lesions erythema or exudate. Neck: Supple without thyromegaly, masses, or lymphadenopathy.  Lungs: Clear to auscultation bilaterally.  Heart: Regular rate and rhythm, no murmurs rubs or gallops.  Abdomen: Bowel sounds are normal, nontender, nondistended, no hepatosplenomegaly or masses, no abdominal bruits or    hernia , no rebound or guarding.  Exam limited by obesity Rectal: Deferred Extremities: No lower extremity edema. No clubbing or deformities.  Neuro: Alert and oriented x 4 , grossly normal neurologically.  Skin: Warm and dry, no rash or jaundice.   Psych: Alert and cooperative, normal mood and affect.  Labs: Lab Results  Component Value Date   TSH 3.63 08/21/2016   Lab Results  Component Value Date   WBC 5.5 04/19/2016   HGB 12.2 04/19/2016   HCT 38.3 04/19/2016   MCV 82.2 04/19/2016   PLT 395 04/19/2016   Lab Results  Component Value Date   CREATININE 0.69 08/21/2016   BUN 13 08/21/2016   NA 136 08/21/2016    K 4.4 08/21/2016   CL 97 (L) 08/21/2016   CO2 29 08/21/2016   Lab Results  Component Value Date  ALT 21 08/21/2016   AST 18 08/21/2016   ALKPHOS 75 08/21/2016   BILITOT 0.3 08/21/2016     Imaging Studies: No results found.

## 2016-09-01 NOTE — Patient Instructions (Signed)
1. Colonoscopy as scheduled. See separate instructions.  

## 2016-09-03 ENCOUNTER — Other Ambulatory Visit: Payer: Self-pay | Admitting: Family Medicine

## 2016-09-04 ENCOUNTER — Ambulatory Visit (INDEPENDENT_AMBULATORY_CARE_PROVIDER_SITE_OTHER): Payer: Medicare Other | Admitting: Women's Health

## 2016-09-04 ENCOUNTER — Other Ambulatory Visit: Payer: Self-pay | Admitting: Family Medicine

## 2016-09-04 ENCOUNTER — Ambulatory Visit (INDEPENDENT_AMBULATORY_CARE_PROVIDER_SITE_OTHER): Payer: Medicare Other

## 2016-09-04 ENCOUNTER — Other Ambulatory Visit: Payer: Self-pay

## 2016-09-04 ENCOUNTER — Encounter: Payer: Self-pay | Admitting: Women's Health

## 2016-09-04 VITALS — BP 152/86 | HR 94 | Wt 315.0 lb

## 2016-09-04 DIAGNOSIS — K2970 Gastritis, unspecified, without bleeding: Principal | ICD-10-CM

## 2016-09-04 DIAGNOSIS — B9681 Helicobacter pylori [H. pylori] as the cause of diseases classified elsewhere: Secondary | ICD-10-CM

## 2016-09-04 DIAGNOSIS — N839 Noninflammatory disorder of ovary, fallopian tube and broad ligament, unspecified: Secondary | ICD-10-CM | POA: Diagnosis not present

## 2016-09-04 DIAGNOSIS — N838 Other noninflammatory disorders of ovary, fallopian tube and broad ligament: Secondary | ICD-10-CM | POA: Insufficient documentation

## 2016-09-04 DIAGNOSIS — R1031 Right lower quadrant pain: Secondary | ICD-10-CM

## 2016-09-04 DIAGNOSIS — M62830 Muscle spasm of back: Secondary | ICD-10-CM

## 2016-09-04 DIAGNOSIS — G4733 Obstructive sleep apnea (adult) (pediatric): Secondary | ICD-10-CM | POA: Diagnosis not present

## 2016-09-04 NOTE — Progress Notes (Signed)
Pt called office. She will pick-up tomorrow.

## 2016-09-04 NOTE — Progress Notes (Signed)
PELVIC US TA/TV: normal vag.cuff,left oophorectomy,hypoechoic right ovarian mass w/arterial and venous flow 3.3 x 1.9 x 1.8 cm,no free fluid,right ovary appears mobile,right adnexal pain during ultrasound

## 2016-09-04 NOTE — Progress Notes (Signed)
   [facility] Visit  Patient name: Angela [name] MRN [id number]  Date of birth: November 23, 1960  CC & HPI:  Angela [name] is a 56 y.o. [phone] African American female presenting today for f/u after pelvic u/s done for Rt lower abd/pelvic pain x 6-7wks. Had hysterectomy w/ Lt oophorectomy @ age 76yo- pt now states hysterectomy was b/c she had cancerous changes to cervix, Lt ovary removed d/t finding a 'big cyst behind it' during the surgery. She still has Rt ovary. Pt states she had a cyst on Rt ovary few years ago, hasn't had f/u u/s until now. Mom passed w/ metastatic cervical cancer.  No LMP recorded. Patient has had a hysterectomy. The current method of family planning is status post hysterectomy. Last pap 2016 neg w/ Dr. Adah Perl  Pertinent History Reviewed:  Medical & Surgical Hx:   Past medical, surgical, family, and social history reviewed in electronic medical record Medications: Reviewed & Updated - see associated section Allergies: Reviewed in electronic medical record  Objective Findings:  Vitals: BP (!) 152/86 (BP Location: Right Arm, Patient Position: Sitting, Cuff Size: Large)   Pulse 94   Wt (!) 315 lb (142.9 kg)   BMI 54.07 kg/m  Body mass index is 54.07 kg/m.  Physical Examination: General appearance - alert, well appearing, and in no distress  Today's pelvic u/s: Images reviewed by Dr. Merlyn Albert Talmadge is a 56 y.o. [phone] is here for a pelvic sonogram for right lower quadrant pain.  Vaginal cuff              wnl  Endometrium          N/A  Right ovary             3.1 x 2.6 x 2.2 cm, hypoechoic right ovarian mass w/arterial and venous flow 3.3 x 1.9 x 1.8 cm  Left ovary                oophorectomy  No free fluid,right adnexal pain during ultrasound  Technician Comments:  PELVIC US TA/TV: normal vag.cuff,left oophorectomy,hypoechoic right ovarian mass w/arterial and venous flow 3.3 x 1.9 x 1.8 cm,no free fluid,right ovary appears mobile,right  adnexal pain during ultrasound [employer] 09/04/2016 2:14 PM  No results found. However, due to the size of the patient record, not all encounters were searched. Please check Results Review for a complete set of results.   Assessment & Plan:  A:   Hypoechoic Rt ovarian mass w/ arterial and venous flow 3.3x1.9x1.8cm   Rt lower abdominal/pelvic pain  S/P hysterectomy d/t 'cancerous changes to cx'  Family h/o metastatic cervical cancer  P:  Discussed w/[facility], who reviewed u/s images- recommends CA125 and f/u u/s in 7mths  Discussed getting op note from hysterectomy/Lt oophorectomy to decipher 'cancerous changes to cx/reason for hysterectomy' as well as her last pelvic u/s to determine if this could be the same mass present- pt states she prefers to get these herself rather than sign consent/have [place] retrieve records  Will call pt w/ CA125 results and discuss f/u from there  Return in about 3 months (around 12/05/2016) for US:GYN and f/u w/ Dr. Elonda Husky.  Tawnya Crook CNM, [facility] 09/04/2016 3:08 PM

## 2016-09-04 NOTE — Progress Notes (Signed)
CC'ED TO PCP 

## 2016-09-04 NOTE — Progress Notes (Signed)
Please ask patient to have h.pylori stool antigen testing done OFF PPI and no antibiotics within two weeks of stool collection. She was supposed to do this in 2013 but did not complete. She has h/o h.pylori gastirits and we need to make sure not persistent. Please let her know that h.pylori can lead to stomach cancer if left untreated.

## 2016-09-04 NOTE — Progress Notes (Signed)
LMOM for a return call. Order at front for pick up with the container.

## 2016-09-04 NOTE — Assessment & Plan Note (Signed)
Patient never completed H.pylori stool antigen test in 2013 to show eradication. Request completion OFF antibiotics/PPIs for 2 weeks.

## 2016-09-04 NOTE — Assessment & Plan Note (Signed)
Questionable family history of colon cancer in her father although previously reported that she was not sure if colon or prostate. She is due screening colonoscopy at any time. Plan for colonoscopy with deep sedation in or given polypharmacy.  I have discussed the risks, alternatives, benefits with regards to but not limited to the risk of reaction to medication, bleeding, infection, perforation and the patient is agreeable to proceed. Written consent to be obtained.

## 2016-09-05 LAB — CA 125: CA 125: 9.6 U/mL (ref 0.0–38.1)

## 2016-09-05 NOTE — Progress Notes (Signed)
Per Tretha Sciara, pt is aware to be off of PPI for 2 weeks and no antibiotics before doing the test.

## 2016-09-06 ENCOUNTER — Telehealth: Payer: Self-pay | Admitting: Women's Health

## 2016-09-06 DIAGNOSIS — N838 Other noninflammatory disorders of ovary, fallopian tube and broad ligament: Secondary | ICD-10-CM

## 2016-09-06 DIAGNOSIS — J45909 Unspecified asthma, uncomplicated: Secondary | ICD-10-CM | POA: Diagnosis not present

## 2016-09-07 NOTE — Telephone Encounter (Signed)
Called pt. Notified CA125 normal, so unlikely cancer. Pt wants Rt ovarian mass removed, does not want to expectantly manage w/ f/u in 41mths. Transferred to front desk to schedule pre-op. Cancel 8/7 appt for u/s and f/u.  Roma Schanz, CNM, WHNP-BC 09/07/2016 12:20 PM

## 2016-09-11 ENCOUNTER — Telehealth: Payer: Self-pay | Admitting: Gastroenterology

## 2016-09-11 NOTE — Telephone Encounter (Signed)
Procedure canceled. Called and informed Endo scheduler.

## 2016-09-11 NOTE — Telephone Encounter (Signed)
Pt called to say that she is having surgery to remove a mass on her ovaries and will need to cancel her procedure with [place] on 5/29 with [facility] and will call back later to reschedule.

## 2016-09-13 ENCOUNTER — Other Ambulatory Visit: Payer: Self-pay | Admitting: Family Medicine

## 2016-09-15 ENCOUNTER — Ambulatory Visit (INDEPENDENT_AMBULATORY_CARE_PROVIDER_SITE_OTHER): Payer: Medicare Other | Admitting: Obstetrics & Gynecology

## 2016-09-15 ENCOUNTER — Encounter: Payer: Self-pay | Admitting: Obstetrics & Gynecology

## 2016-09-15 ENCOUNTER — Other Ambulatory Visit: Payer: Self-pay | Admitting: Family Medicine

## 2016-09-15 VITALS — BP 120/80 | HR 80 | Wt 318.4 lb

## 2016-09-15 DIAGNOSIS — N839 Noninflammatory disorder of ovary, fallopian tube and broad ligament, unspecified: Secondary | ICD-10-CM

## 2016-09-15 DIAGNOSIS — Z1231 Encounter for screening mammogram for malignant neoplasm of breast: Secondary | ICD-10-CM

## 2016-09-15 DIAGNOSIS — N838 Other noninflammatory disorders of ovary, fallopian tube and broad ligament: Secondary | ICD-10-CM

## 2016-09-15 DIAGNOSIS — R1031 Right lower quadrant pain: Secondary | ICD-10-CM

## 2016-09-15 NOTE — Progress Notes (Signed)
Chief Complaint  Patient presents with  . Pre-op Exam    ultrasound done 09-04-16    Blood pressure 120/80, pulse 80, weight (!) 318 lb 6.4 oz (144.4 kg).  56 y.o. [id number] No LMP recorded. Patient has had a hysterectomy. The current method of family planning is status post hysterectomy.  Outpatient Encounter Prescriptions as of 09/15/2016  Medication Sig Note  . albuterol (PROAIR HFA) 108 (90 BASE) MCG/ACT inhaler Inhale 2 puffs into the lungs every 6 (six) hours as needed for wheezing or shortness of breath.   Marland Kitchen amLODipine (NORVASC) 10 MG tablet TAKE 1 TABLET BY MOUTH EVERY DAY   . atorvastatin (LIPITOR) 80 MG tablet TAKE 1 TABLET BY MOUTH EVERY DAY   . canagliflozin (INVOKANA) 300 MG TABS tablet Take 300 mg by mouth daily before breakfast.   . cloNIDine (CATAPRES) 0.3 MG tablet TAKE ONE TABLET AT 9 PM EVERY NIGHT FOR HIGH BLOOD PRESSURE   . cyclobenzaprine (FLEXERIL) 10 MG tablet TAKE 1 TABLET BY MOUTH 3 TIMES A DAY   . diazepam (VALIUM) 5 MG tablet TAKE 1 TABLET BY MOUTH 3 TIMES A DAY   . DULoxetine (CYMBALTA) 60 MG capsule Take 1 capsule (60 mg total) by mouth 2 (two) times daily.   . ergocalciferol (VITAMIN D2) 50000 units capsule Take 1 capsule (50,000 Units total) by mouth once a week. One capsule once weekly   . esomeprazole (NEXIUM) 40 MG capsule TAKE 1 CAPSULE (40 MG TOTAL) BY MOUTH DAILY.   . fluticasone (FLONASE) 50 MCG/ACT nasal spray Place 2 sprays into both nostrils daily. (Patient taking differently: Place 2 sprays into both nostrils as needed. )   . furosemide (LASIX) 40 MG tablet TAKE 1 TABLET BY MOUTH TWICE A DAY   . gabapentin (NEURONTIN) 800 MG tablet TAKE 1 TABLET BY MOUTH 4 TIMES A DAY   . ipratropium-albuterol (DUONEB) 0.5-2.5 (3) MG/3ML SOLN Take 3 mLs by nebulization every 6 (six) hours as needed.   Marland Kitchen KLOR-CON M20 20 MEQ tablet TAKE 1 TABLET (20 MEQ TOTAL) BY MOUTH 2 (TWO) TIMES DAILY.   . metFORMIN (GLUCOPHAGE) 1000 MG tablet TAKE 1 TABLET BY MOUTH TWICE A  DAY WITH FOOD   . montelukast (SINGULAIR) 10 MG tablet TAKE 1 TABLET (10 MG TOTAL) BY MOUTH AT BEDTIME.   Marland Kitchen NITROSTAT 0.4 MG SL tablet PLACE 1 TABLET UNDER TONGUE IF NEEDED FOR CHEST PAIN.Bennie Pierini OF 3 TIMES 01/01/2015: Received from: External Pharmacy  . oxyCODONE (OXYCONTIN) 40 mg 12 hr tablet Take 1 tablet (40 mg total) by mouth every 12 (twelve) hours.   Marland Kitchen PROAIR HFA 108 (90 BASE) MCG/ACT inhaler INHALE 2 PUFFS BY MOUTH EVERY 6-8HRS AS NEEDED   . SYMBICORT 160-4.5 MCG/ACT inhaler INHALE 2 PUUFS BY MOUTH TWICE DAILY   . triamcinolone cream (KENALOG) 0.1 % Apply 1 application topically 2 (two) times daily.   Marland Kitchen triamterene-hydrochlorothiazide (MAXZIDE) 75-50 MG tablet TAKE 1 TABLET BY MOUTH DAILY.   Marland Kitchen zolpidem (AMBIEN) 10 MG tablet TAKE 1 TABLET BY MOUTH AT BEDTIME    No facility-administered encounter medications on file as of 09/15/2016.     Subjective Pt is seen in follow up from visit and sonogram earlier with Knute Neu CNM Found a persistent right ovarian mass with arterial and venous flow, she is s/p hysterectomy and removal of left ovary CA 125 is normal but pt is adamant she wants it removed It has not changed in size over the past 6 months and with patient's  medical and surgical issues laparosocpic removaal would not be without risk I informed her I am comfortable managing conservatively especially since th epain she is having is not related to the mass in my opinion  Objective   Pertinent ROS No burning with urination, frequency or urgency No nausea, vomiting or diarrhea Nor fever chills or other constitutional symptoms   Labs or studies     Impression Diagnoses this Encounter::   ICD-9-CM ICD-10-CM   1. Right lower quadrant abdominal pain 789.03 R10.31   2. Mass of right ovary 620.9 N83.9     Established relevant diagnosis(es):   Plan/Recommendations: No orders of the defined types were placed in this encounter.   Labs or Scans Ordered: No orders of the defined  types were placed in this encounter.   Management:: consevative management for now Re evaluate 6 week with patient  Follow up Return in about 6 weeks (around 10/27/2016) for Follow up, with Dr Elonda Husky.        Face to face time:  15 minutes  Greater than 50% of the visit time was spent in counseling and coordination of care with the patient.  The summary and outline of the counseling and care coordination is summarized in the note above.   All questions were answered.  Past Medical History:  Diagnosis Date  . Anxiety   . Arthritis   . Asthma   . Coronary atherosclerosis of native coronary artery    Mild nonobstructive CAD by cardiac catheterization 2008 - Dr. Terrence Dupont  . Degenerative disc disease   . Depression   . Dysrhythmia   . Essential hypertension, benign   . Folliculitis 0/27/2536  . GERD (gastroesophageal reflux disease)   . H/O hiatal hernia   . Helicobacter pylori gastritis 2008  . Hypercholesterolemia   . Hypertension   . Hyperthyroidism    s/p radiation  . Low back pain   . Migraine   . Nephrolithiasis    Recurring episodes since 2004  . Severe obesity (BMI >= 40) (HCC)   . Sleep apnea    STOP BANG SCORE 6no cpap used  . Type 2 diabetes mellitus with diabetic neuropathy [facility])     Past Surgical History:  Procedure Laterality Date  . ABDOMINAL HYSTERECTOMY    . BACK SURGERY  1993  . BACK SURGERY  1994  . BACK SURGERY  2002  . BACK SURGERY  2011   [place]  . COLONOSCOPY  2000 [facility]   INT HEMORRHOIDS/FISSURE  . COLONOSCOPY  2003 [facility], CHANGE IN BOWEL HABITS   INT HEMORRHOIDS  . COLONOSCOPY  2006 [facility]   INT HEMORRHOIDS  . COLONOSCOPY  2007 [facility]   INT HEMORRHOIDS  . CYSTOSCOPY WITH STENT PLACEMENT Left 07/25/2012   Procedure: CYSTOSCOPY, left retograde pyelogram WITH left ureteral  STENT PLACEMENT;  Surgeon: Claybon Jabs, MD;  [facility];  Service: Urology;  Laterality: Left;  .  CYSTOSCOPY/RETROGRADE/URETEROSCOPY  12/04/2011   Procedure: CYSTOSCOPY/RETROGRADE/URETEROSCOPY;  Surgeon: Molli Hazard, MD;  [facility];  Service: Urology;  Laterality: Right;  . CYSTOSCOPY/RETROGRADE/URETEROSCOPY/STONE EXTRACTION WITH BASKET Left 09/03/2012   Procedure: CYSTOSCOPY/RETROGRADE pyelogram/digital URETEROSCOPY/STONE EXTRACTION WITH BASKET, left stent removal;  Surgeon: Molli Hazard, MD;  [facility];  Service: Urology;  Laterality: Left;  . ESOPHAGOGASTRODUODENOSCOPY  2008   UYQ:IHKVQ hiatal hernia./Normal esophagus without evidence of Barrett's mass, stricture, erosion or ulceration./Normal duodenal bulb and second portion of the duodenum./Diffuse erythema in the body and the antrum  with occasional erosion.  Biopsies obtained via cold forceps to evaluate for H. pylori gastritis  . FRACTURE SURGERY  1999   right clavicle  . HOLMIUM LASER APPLICATION Left 1/0/3013   Procedure: HOLMIUM LASER APPLICATION;  Surgeon: Molli Hazard, MD;  [facility];  Service: Urology;  Laterality: Left;  . KNEE ARTHROSCOPY  10/04/2010,    right knee arthroscopy, dr Theda Sers  . left knee arthroscopic surgery  1999  . Left salphingectomy secondary to ectopic pregnancy  1991  . PARTIAL HYSTERECTOMY  1991  . rotary cuff  Right 05/14/2014   [facility]  . UPPER GASTROINTESTINAL ENDOSCOPY  2008 abd pain   H. pylori gastritis  . UPPER GASTROINTESTINAL ENDOSCOPY  [place]   PUD    OB History    Gravida Para Term Preterm AB Living   3 2 1 1 1 1    SAB TAB Ectopic Multiple Live Births       1   2      Allergies  Allergen Reactions  . Morphine Other (See Comments)    Reaction with esophagus, unable to swallow.   . Ace Inhibitors Cough  . Aspirin Other (See Comments)    Reaction with esophagus, unable to swallow.   . Losartan Cough  . Tapentadol Other (See Comments)    Nausea, increased sleepiness, h/a  . Tomato Other (See Comments)    Acid reflux due to  acid in tomato  . Adhesive [Tape] Rash    Social History   Social History  . Marital status: Single    Spouse name: N/A  . Number of children: 1  . Years of education: 18   Occupational History  . Disable     Social History Main Topics  . Smoking status: Never Smoker  . Smokeless tobacco: Never Used  . Alcohol use No  . Drug use: No  . Sexual activity: Not Currently    Partners: Male    Birth control/ protection: Surgical     Comment: hyst   Other Topics Concern  . None   Social History Narrative   Drinks soda daily     Family History  Problem Relation Age of Onset  . Heart disease Mother   . Hypertension Mother   . Cancer Mother        Cervical   . Asthma Mother   . Heart attack Father   . Cancer Father        Prostate  . Colon cancer Father        DECEASED AGE 21  . Colon polyps Neg Hx

## 2016-09-18 ENCOUNTER — Ambulatory Visit (HOSPITAL_COMMUNITY): Payer: Self-pay

## 2016-09-18 NOTE — Telephone Encounter (Signed)
REVIEWED PT RECORDS. SHE HAS GAINED 43 LBS SINCE 2008. HER BMI > 50 AND SHE HAS SLEEP APNEA. TOO HIGH RISK FOR ANESTHESIA AT [facility]. NEEDS SCREENING TCS AT [facility]/ [facility].

## 2016-09-18 NOTE — Telephone Encounter (Signed)
Tried to call with no answer  

## 2016-09-19 NOTE — Telephone Encounter (Signed)
Tried to call with no answer  

## 2016-09-20 ENCOUNTER — Other Ambulatory Visit (HOSPITAL_COMMUNITY): Payer: Self-pay

## 2016-09-22 NOTE — Telephone Encounter (Signed)
Letter mailed for her to call us 

## 2016-09-26 ENCOUNTER — Other Ambulatory Visit: Payer: Self-pay

## 2016-09-26 ENCOUNTER — Ambulatory Visit (HOSPITAL_COMMUNITY): Admission: RE | Admit: 2016-09-26 | Payer: Medicare Other | Source: Ambulatory Visit | Admitting: Gastroenterology

## 2016-09-26 ENCOUNTER — Encounter (HOSPITAL_COMMUNITY): Admission: RE | Payer: Self-pay | Source: Ambulatory Visit

## 2016-09-26 DIAGNOSIS — K625 Hemorrhage of anus and rectum: Secondary | ICD-10-CM

## 2016-09-26 SURGERY — COLONOSCOPY WITH PROPOFOL
Anesthesia: Monitor Anesthesia Care

## 2016-09-26 MED ORDER — HYDROCORTISONE 2.5 % RE CREA: 1.0000 "application " | TOPICAL_CREAM | Freq: Four times a day (QID) | RECTAL | 1 refills | Status: DC

## 2016-09-26 NOTE — Telephone Encounter (Signed)
Pt is aware of Rx sent into the pharmacy. She also said that she has an appointment at [facility] Friday 09/29/16 @ 9:00 with Dr.Weinberz.

## 2016-09-26 NOTE — Addendum Note (Signed)
Addended by: Danie Binder on: 09/26/2016 08:48 AM   Modules accepted: Orders

## 2016-09-26 NOTE — Telephone Encounter (Signed)
PLEASE CALL PT. I SPOKE TO [facility] GI OFFICE(JANET). PT NEEDS TO KEEP HER APPT WITH DR. Tally Joe JUN 1 @9  AM. HE CAN MAKE THE DECISION TO MAKER HER COLONOSCOPY ASAP ONCE HE SEES HER AND EVALUATES HER.Marland Kitchen

## 2016-09-26 NOTE — Telephone Encounter (Signed)
PLEASE CALL PT. She can use ANUSOL CREAM QID FOR 10 DAYS TO CONTROL RECTAL BLEEDING.

## 2016-09-26 NOTE — Telephone Encounter (Signed)
REVIEWED.  

## 2016-09-26 NOTE — Telephone Encounter (Signed)
Angela [name] from [facility] called [phone])- they have spoken with the pt today and they cannot get her in for a tcs until the end of July. Pt wants it done earlier than that. She said the only way they can get it done sooner is if it is ordered stat, if Dr.Fields feels like it is medically necessary to be done earlier.

## 2016-09-26 NOTE — Telephone Encounter (Signed)
Pt called this morning. I informed her that [facility] would like for her to go to [facility] for her TCS. She said that would be fine. She also informed me that she has been bleeding since Thursday and soaking through a pad. She fell at her house Monday night. I informed her to go to the ER since she was bleeding.She said that she was going to call [facility]. I gave her the number to call. I am going to send the information over for her TCS.

## 2016-09-27 NOTE — Telephone Encounter (Signed)
LMOM to call.

## 2016-09-27 NOTE — Telephone Encounter (Signed)
LMOM to call right away for some important information.

## 2016-09-28 NOTE — Telephone Encounter (Signed)
Patient made aware to keep her appt at [facility] with Dr. Tally Joe

## 2016-10-01 ENCOUNTER — Other Ambulatory Visit: Payer: Self-pay | Admitting: Family Medicine

## 2016-10-01 DIAGNOSIS — I10 Essential (primary) hypertension: Secondary | ICD-10-CM

## 2016-10-05 DIAGNOSIS — G4733 Obstructive sleep apnea (adult) (pediatric): Secondary | ICD-10-CM | POA: Diagnosis not present

## 2016-10-07 DIAGNOSIS — J45909 Unspecified asthma, uncomplicated: Secondary | ICD-10-CM | POA: Diagnosis not present

## 2016-10-24 DIAGNOSIS — M545 Low back pain: Secondary | ICD-10-CM | POA: Diagnosis not present

## 2016-10-24 DIAGNOSIS — R209 Unspecified disturbances of skin sensation: Secondary | ICD-10-CM | POA: Diagnosis not present

## 2016-10-24 DIAGNOSIS — E1140 Type 2 diabetes mellitus with diabetic neuropathy, unspecified: Secondary | ICD-10-CM | POA: Diagnosis not present

## 2016-10-24 DIAGNOSIS — E785 Hyperlipidemia, unspecified: Secondary | ICD-10-CM | POA: Diagnosis not present

## 2016-10-24 DIAGNOSIS — M25569 Pain in unspecified knee: Secondary | ICD-10-CM | POA: Diagnosis not present

## 2016-10-27 ENCOUNTER — Ambulatory Visit: Payer: Medicare Other | Admitting: Obstetrics & Gynecology

## 2016-10-30 ENCOUNTER — Encounter: Payer: Self-pay | Admitting: Obstetrics and Gynecology

## 2016-10-30 ENCOUNTER — Ambulatory Visit (INDEPENDENT_AMBULATORY_CARE_PROVIDER_SITE_OTHER): Payer: Medicare Other | Admitting: Obstetrics and Gynecology

## 2016-10-30 VITALS — BP 160/88 | HR 88 | Wt 318.2 lb

## 2016-10-30 DIAGNOSIS — N839 Noninflammatory disorder of ovary, fallopian tube and broad ligament, unspecified: Secondary | ICD-10-CM

## 2016-10-30 DIAGNOSIS — N838 Other noninflammatory disorders of ovary, fallopian tube and broad ligament: Secondary | ICD-10-CM

## 2016-10-30 NOTE — Patient Instructions (Signed)
Weight loss guidelines and tips ° ° °1. Utilize measuring cups and spoons (found at a low price at [employer]) to monitor and manage serving sizes. Use them to carefully measure out serving sizes listed on food packaging to prevent overeating and better manage caloric intake. Most of us tell ourselves lies about how much a serving really is. For example, a serving of meat is actually 4 oz, which is the size of a deck of cards!!!  ° °2. Utilize smart phone apps like "My Net Diary", "My Fitness Pal", "Lose It", or "Eat Better" to keep track of calorie intake and exercise. Also consider purchasing a pedometer to keep track of daily steps. Some smart phones have built in pedometers to keep track of step counts (your goal is 10,000 steps per day).  ° °3. Make sure you are consuming enough water daily. Drink 8 oz prior to meals, or at snack time, to cut down on overeating.  ° °4. Consider exercise programs. [employer] offers water aerobics classes that are low-impact to minimize joint pains while still burning calories. For maximum impact at home, do small exercises while watching TV during commercial breaks.  ° °The Truth Table:   °3,500 calories = 1 pound of fat  °Minus 500 calories a day x 7 days will lose you 1 pound  °Exercise helps, but one mile walk = 200 calories burned   °10,000 steps is your goal for the day, that's 4-5 miles!  ° °*Please cut out and put on your refrigerator*   °

## 2016-10-30 NOTE — Progress Notes (Signed)
Preoperative History and Physical  Angela [name] is a 56 y.o. [id number] here for surgical management of ovarian cyst,noted to be small,with normal ca125. Pt is concerned about her BP, and wants to cancel her surgery. Pt is having difficulty losing weight before surgery.  Proposed surgery:  oopeherectomy.  Past Medical History:  Diagnosis Date  . Anxiety   . Arthritis   . Asthma   . Coronary atherosclerosis of native coronary artery    Mild nonobstructive CAD by cardiac catheterization 2008 - Dr. Terrence Dupont  . Degenerative disc disease   . Depression   . Dysrhythmia   . Essential hypertension, benign   . Folliculitis 01/14/3845  . GERD (gastroesophageal reflux disease)   . H/O hiatal hernia   . Helicobacter pylori gastritis 2008  . Hypercholesterolemia   . Hypertension   . Hyperthyroidism    s/p radiation  . Low back pain   . Migraine   . Nephrolithiasis    Recurring episodes since 2004  . Severe obesity (BMI >= 40) (HCC)   . Sleep apnea    STOP BANG SCORE 6no cpap used  . Type 2 diabetes mellitus with diabetic neuropathy [facility])    Past Surgical History:  Procedure Laterality Date  . ABDOMINAL HYSTERECTOMY    . BACK SURGERY  1993  . BACK SURGERY  1994  . BACK SURGERY  2002  . BACK SURGERY  2011   [place]  . COLONOSCOPY  2000 [facility]   INT HEMORRHOIDS/FISSURE  . COLONOSCOPY  2003 [facility], CHANGE IN BOWEL HABITS   INT HEMORRHOIDS  . COLONOSCOPY  2006 [facility]   INT HEMORRHOIDS  . COLONOSCOPY  2007 [facility]   INT HEMORRHOIDS  . CYSTOSCOPY WITH STENT PLACEMENT Left 07/25/2012   Procedure: CYSTOSCOPY, left retograde pyelogram WITH left ureteral  STENT PLACEMENT;  Surgeon: Claybon Jabs, MD;  Location: [facility];  Service: Urology;  Laterality: Left;  . CYSTOSCOPY/RETROGRADE/URETEROSCOPY  12/04/2011   Procedure: CYSTOSCOPY/RETROGRADE/URETEROSCOPY;  Surgeon: Molli Hazard, MD;  Location: [facility];  Service: Urology;  Laterality: Right;  .  CYSTOSCOPY/RETROGRADE/URETEROSCOPY/STONE EXTRACTION WITH BASKET Left 09/03/2012   Procedure: CYSTOSCOPY/RETROGRADE pyelogram/digital URETEROSCOPY/STONE EXTRACTION WITH BASKET, left stent removal;  Surgeon: Molli Hazard, MD;  Location: [facility];  Service: Urology;  Laterality: Left;  . ESOPHAGOGASTRODUODENOSCOPY  2008   KZL:DJTTS hiatal hernia./Normal esophagus without evidence of Barrett's mass, stricture, erosion or ulceration./Normal duodenal bulb and second portion of the duodenum./Diffuse erythema in the body and the antrum with occasional erosion.  Biopsies obtained via cold forceps to evaluate for H. pylori gastritis  . FRACTURE SURGERY  1999   right clavicle  . HOLMIUM LASER APPLICATION Left 05/07/7937   Procedure: HOLMIUM LASER APPLICATION;  Surgeon: Molli Hazard, MD;  Location: [facility];  Service: Urology;  Laterality: Left;  . KNEE ARTHROSCOPY  10/04/2010,    right knee arthroscopy, dr Theda Sers  . left knee arthroscopic surgery  1999  . Left salphingectomy secondary to ectopic pregnancy  1991  . PARTIAL HYSTERECTOMY  1991  . rotary cuff  Right 05/14/2014   [facility]  . UPPER GASTROINTESTINAL ENDOSCOPY  2008 abd pain   H. pylori gastritis  . UPPER GASTROINTESTINAL ENDOSCOPY  1996 [facility] [place]   PUD   OB History  Gravida Para Term Preterm AB Living  3 2 1 1 1 1   SAB TAB Ectopic Multiple Live Births      1   2    # Outcome Date GA Lbr Len/2nd  Weight Sex Delivery Anes PTL Lv  3 Term     F Vag-Spont   LIV  2 Ectopic     F      1 Preterm     M    ND    Patient denies any other pertinent gynecologic issues.   Current Outpatient Prescriptions on File Prior to Visit  Medication Sig Dispense Refill  . albuterol (PROAIR HFA) 108 (90 BASE) MCG/ACT inhaler Inhale 2 puffs into the lungs every 6 (six) hours as needed for wheezing or shortness of breath.    Marland Kitchen amLODipine (NORVASC) 10 MG tablet Take 1 tablet (10 mg total) by mouth daily. 30 tablet 11  . atorvastatin (LIPITOR) 80 MG  tablet TAKE 1 TABLET BY MOUTH EVERY DAY 90 tablet 1  . canagliflozin (INVOKANA) 300 MG TABS tablet Take 300 mg by mouth daily before breakfast. 30 tablet 4  . cloNIDine (CATAPRES) 0.3 MG tablet TAKE ONE TABLET AT 9 PM EVERY NIGHT FOR HIGH BLOOD PRESSURE 90 tablet 1  . cyclobenzaprine (FLEXERIL) 10 MG tablet TAKE 1 TABLET BY MOUTH 3 TIMES A DAY 90 tablet 3  . diazepam (VALIUM) 5 MG tablet TAKE 1 TABLET BY MOUTH 3 TIMES A DAY 90 tablet 2  . DULoxetine (CYMBALTA) 60 MG capsule Take 1 capsule (60 mg total) by mouth 2 (two) times daily. 60 capsule 3  . ergocalciferol (VITAMIN D2) 50000 units capsule Take 1 capsule (50,000 Units total) by mouth once a week. One capsule once weekly 12 capsule 0  . esomeprazole (NEXIUM) 40 MG capsule TAKE 1 CAPSULE (40 MG TOTAL) BY MOUTH DAILY. 90 capsule 1  . fluticasone (FLONASE) 50 MCG/ACT nasal spray Place 2 sprays into both nostrils daily. (Patient taking differently: Place 2 sprays into both nostrils as needed. ) 16 g 6  . furosemide (LASIX) 40 MG tablet TAKE 1 TABLET BY MOUTH TWICE A DAY 180 tablet 1  . gabapentin (NEURONTIN) 800 MG tablet TAKE 1 TABLET BY MOUTH 4 TIMES A DAY 360 tablet 1  . hydrocortisone (ANUSOL-HC) 2.5 % rectal cream Place 1 application rectally 4 (four) times daily. FOR 12 DAYS 30 g 1  . ipratropium-albuterol (DUONEB) 0.5-2.5 (3) MG/3ML SOLN Take 3 mLs by nebulization every 6 (six) hours as needed. 360 mL 1  . KLOR-CON M20 20 MEQ tablet TAKE 1 TABLET (20 MEQ TOTAL) BY MOUTH 2 (TWO) TIMES DAILY. 180 tablet 1  . metFORMIN (GLUCOPHAGE) 1000 MG tablet TAKE 1 TABLET BY MOUTH TWICE A DAY WITH FOOD 180 tablet 1  . montelukast (SINGULAIR) 10 MG tablet TAKE 1 TABLET (10 MG TOTAL) BY MOUTH AT BEDTIME. 90 tablet 1  . oxyCODONE (OXYCONTIN) 40 mg 12 hr tablet Take 1 tablet (40 mg total) by mouth every 12 (twelve) hours. 60 tablet 0  . PROAIR HFA 108 (90 BASE) MCG/ACT inhaler INHALE 2 PUFFS BY MOUTH EVERY 6-8HRS AS NEEDED 8.5 Inhaler 1  . SYMBICORT 160-4.5  MCG/ACT inhaler INHALE 2 PUUFS BY MOUTH TWICE DAILY 10.2 Inhaler 5  . triamcinolone cream (KENALOG) 0.1 % Apply 1 application topically 2 (two) times daily. 60 g 0  . triamterene-hydrochlorothiazide (MAXZIDE) 75-50 MG tablet TAKE 1 TABLET BY MOUTH DAILY. 90 tablet 0  . zolpidem (AMBIEN) 10 MG tablet TAKE 1 TABLET BY MOUTH AT BEDTIME 30 tablet 2  . NITROSTAT 0.4 MG SL tablet PLACE 1 TABLET UNDER TONGUE IF NEEDED FOR CHEST PAIN.Bennie Pierini OF 3 TIMES  11  . [DISCONTINUED] losartan-hydrochlorothiazide (HYZAAR) 50-12.5 MG per tablet TAKE 1 TABLET  BY MOUTH ONCE DAILY *REPLACES DIOVAN/HCTZ* 30 tablet 0   No current facility-administered medications on file prior to visit.    Allergies  Allergen Reactions  . Morphine Other (See Comments)    Reaction with esophagus, unable to swallow.   . Ace Inhibitors Cough  . Aspirin Other (See Comments)    Reaction with esophagus, unable to swallow.   . Losartan Cough  . Tapentadol Other (See Comments)    Nausea, increased sleepiness, h/a  . Tomato Other (See Comments)    Acid reflux due to acid in tomato  . Adhesive [Tape] Rash    Social History:   reports that she has never smoked. She has never used smokeless tobacco. She reports that she does not drink alcohol or use drugs.  Family History  Problem Relation Age of Onset  . Heart disease Mother   . Hypertension Mother   . Cancer Mother        Cervical   . Asthma Mother   . Heart attack Father   . Cancer Father        Prostate  . Colon cancer Father        DECEASED AGE 23  . Colon polyps Neg Hx     Review of Systems: Noncontributory  PHYSICAL EXAM: Blood pressure (!) 160/88, pulse 88, weight (!) 318 lb 3.2 oz (144.3 kg). General appearance - alert, well appearing, and in no distress Pelvic - examination not indicated Extremities - peripheral pulses normal, no pedal edema, no clubbing or cyanosis  Discussion: Discussed with pt weight loss methods including food measurement and calorie counting  using apps such as MyNetDiary or My Fitness Pal. Recommended the use of measuring cups and spoons to monitor serving sizes. Encouraged adequate daily water intake, especially prior to meals to eliminate overeating. Additionally encouraged pt to become more active by taking daily walks of at least 30 minute duration, join a local gym such as the [facility] or attend water aerobics classes. Also advised pt to use pedometer on smartphone or utilize a smartband such as FitBit to keep track of daily activity.   At end of discussion, pt had opportunity to ask questions and has no further questions at this time.   Specific discussion of lifestyle changes, behavioral modifications and healthy eating habits as noted above. Greater than 50% was spent in counseling and coordination of care with the patient.  Total time greater than: 25 minutes.    Labs: No results found. However, due to the size of the patient record, not all encounters were searched. Please check Results Review for a complete set of results.  Imaging Studies: No results found.  Assessment: Patient Active Problem List   Diagnosis Date Noted  . Mass of right ovary 09/04/2016  . FH: colon cancer 09/01/2016  . Numbness of left hand 08/21/2016  . Allergic rhinitis 12/05/2015  . Upper airway cough syndrome 09/19/2015  . Back muscle spasm 09/14/2015  . Rash and nonspecific skin eruption 09/14/2015  . Knee pain, right 03/29/2015  . Cervical neck pain with evidence of disc disease 04/13/2014  . Coronary atherosclerosis of native coronary artery 05/26/2013  . Encounter for chronic pain management 08/02/2012  . Hemorrhoid 04/08/2012  . Vitamin D deficiency 04/04/2012  . Nephrolithiasis 12/10/2011  . Sleep apnea syndrome 12/05/2011  . GERD (gastroesophageal reflux disease) 08/16/2011  . Back pain 11/03/2010  . Hyperlipidemia LDL goal <70 08/11/2008  . Morbid obesity ([place]) 01/06/2008  . Diabetes mellitus without complication ([place]) 28/78/6767   .  Depression with anxiety 05/05/2006  . Essential hypertension 05/05/2006  . Asthma 05/05/2006  . Helicobacter pylori gastritis 05/01/2006    Plan: Patient will postpone surgical management with oopherectomy. Pt will return in 90 days for weight loss reassessment .  By signing my name below, I, Evelene Croon, attest that this documentation has been prepared under the direction and in the presence of Jonnie Kind, MD . Electronically Signed: Evelene Croon, Scribe. 10/30/2016. 11:52 AM. I personally performed the services described in this documentation, which was SCRIBED in my presence. The recorded information has been reviewed and considered accurate. It has been edited as necessary during review. Jonnie Kind, MD

## 2016-11-04 DIAGNOSIS — G4733 Obstructive sleep apnea (adult) (pediatric): Secondary | ICD-10-CM | POA: Diagnosis not present

## 2016-11-05 ENCOUNTER — Other Ambulatory Visit: Payer: Self-pay | Admitting: Family Medicine

## 2016-11-06 ENCOUNTER — Encounter: Payer: Self-pay | Admitting: Family Medicine

## 2016-11-06 ENCOUNTER — Ambulatory Visit (INDEPENDENT_AMBULATORY_CARE_PROVIDER_SITE_OTHER): Payer: Medicare Other | Admitting: Family Medicine

## 2016-11-06 ENCOUNTER — Other Ambulatory Visit: Payer: Self-pay | Admitting: Family Medicine

## 2016-11-06 VITALS — BP 140/82 | HR 95 | Resp 16 | Ht 63.0 in | Wt 320.0 lb

## 2016-11-06 DIAGNOSIS — I10 Essential (primary) hypertension: Secondary | ICD-10-CM | POA: Diagnosis not present

## 2016-11-06 DIAGNOSIS — G4733 Obstructive sleep apnea (adult) (pediatric): Secondary | ICD-10-CM | POA: Diagnosis not present

## 2016-11-06 DIAGNOSIS — Z1211 Encounter for screening for malignant neoplasm of colon: Secondary | ICD-10-CM | POA: Diagnosis not present

## 2016-11-06 DIAGNOSIS — M25532 Pain in left wrist: Secondary | ICD-10-CM | POA: Diagnosis not present

## 2016-11-06 DIAGNOSIS — E785 Hyperlipidemia, unspecified: Secondary | ICD-10-CM | POA: Diagnosis not present

## 2016-11-06 DIAGNOSIS — Z9181 History of falling: Secondary | ICD-10-CM | POA: Diagnosis not present

## 2016-11-06 DIAGNOSIS — G8929 Other chronic pain: Secondary | ICD-10-CM | POA: Diagnosis not present

## 2016-11-06 DIAGNOSIS — R52 Pain, unspecified: Secondary | ICD-10-CM | POA: Diagnosis not present

## 2016-11-06 DIAGNOSIS — E119 Type 2 diabetes mellitus without complications: Secondary | ICD-10-CM

## 2016-11-06 DIAGNOSIS — M25552 Pain in left hip: Secondary | ICD-10-CM

## 2016-11-06 DIAGNOSIS — J3089 Other allergic rhinitis: Secondary | ICD-10-CM

## 2016-11-06 MED ORDER — CLONIDINE HCL 0.2 MG/24HR TD PTWK: 0.2000 mg | MEDICATED_PATCH | TRANSDERMAL | 12 refills | Status: DC

## 2016-11-06 MED ORDER — OXYCODONE HCL ER 40 MG PO T12A
40.0000 mg | EXTENDED_RELEASE_TABLET | Freq: Two times a day (BID) | ORAL | 0 refills | Status: DC
Start: 2016-11-06 — End: 2016-11-06

## 2016-11-06 MED ORDER — OXYCODONE HCL ER 40 MG PO T12A: 40.0000 mg | EXTENDED_RELEASE_TABLET | Freq: Two times a day (BID) | ORAL | 0 refills | Status: DC

## 2016-11-06 MED ORDER — AZELASTINE HCL 0.1 % NA SOLN: 2.0000 | Freq: Two times a day (BID) | NASAL | 12 refills | Status: DC

## 2016-11-06 MED ORDER — LORATADINE 10 MG PO TABS: 10.0000 mg | ORAL_TABLET | Freq: Every day | ORAL | 5 refills | Status: DC

## 2016-11-06 NOTE — Telephone Encounter (Signed)
Seen 08/21/16 °

## 2016-11-06 NOTE — Patient Instructions (Addendum)
F/u in 12 weeks , call if you need me before  You are referred to [facility] for colonoscopy  New additional medication for allergies, daily astellin and loratidine, continue flonase Start daily saline flushes   Please get X ray of wrist and hip today, I will refer you to ortho in [place]   Handicp sticker   Fasting lipid, cmp and eGFR , hBA1C 1 week prior to next visit  Additional med for blood pressure is once weekly clonidine  Patch  Please work on good  health habits so that your health will improve. 1. Commitment to daily physical activity for 30 to 60  minutes, if you are able to do this.  2. Commitment to wise food choices. Aim for half of your  food intake to be vegetable and fruit, one quarter starchy foods, and one quarter protein. Try to eat on a regular schedule  3 meals per day, snacking between meals should be limited to vegetables or fruits or small portions of nuts. 64 ounces of water per day is generally recommended, unless you have specific health conditions, like heart failure or kidney failure where you will need to limit fluid intake.  3. Commitment to sufficient and a  good quality of physical and mental rest daily, generally between 6 to 8 hours per day.  WITH PERSISTANCE AND PERSEVERANCE, THE IMPOSSIBLE , BECOMES THE NORM!   Thank you  for choosing [facility]. We consider it a privelige to serve you.  Delivering excellent health care in a caring and  compassionate way is our goal.  Partnering with you,  so that together we can achieve this goal is our strategy.

## 2016-11-09 ENCOUNTER — Ambulatory Visit (INDEPENDENT_AMBULATORY_CARE_PROVIDER_SITE_OTHER): Payer: Medicare Other | Admitting: Neurology

## 2016-11-09 ENCOUNTER — Encounter: Payer: Self-pay | Admitting: Neurology

## 2016-11-09 VITALS — BP 174/88 | HR 97 | Ht 64.0 in | Wt 310.0 lb

## 2016-11-09 DIAGNOSIS — Z9989 Dependence on other enabling machines and devices: Secondary | ICD-10-CM | POA: Diagnosis not present

## 2016-11-09 DIAGNOSIS — R52 Pain, unspecified: Secondary | ICD-10-CM | POA: Insufficient documentation

## 2016-11-09 DIAGNOSIS — G4733 Obstructive sleep apnea (adult) (pediatric): Secondary | ICD-10-CM | POA: Diagnosis not present

## 2016-11-09 NOTE — Assessment & Plan Note (Signed)
Improved tho still sub optimal Control, no change in medication DASH diet and commitment to daily physical activity for a minimum of 30 minutes discussed and encouraged, as a part of hypertension management. The importance of attaining a healthy weight is also discussed.  BP/Weight 11/06/2016 10/30/2016 09/15/2016 09/04/2016 09/01/2016 08/28/2016 8/67/6720  Systolic BP 947 096 283 662 947 654 650  Diastolic BP 82 88 80 86 73 80 82  Wt. (Lbs) 320 318.2 318.4 315 313.2 316.5 318  BMI 56.69 54.62 54.65 54.07 53.76 54.33 54.58

## 2016-11-09 NOTE — Patient Instructions (Addendum)
Please continue using your CPAP regularly. While your insurance requires that you use CPAP at least 4 hours each night on 70% of the nights, I recommend, that you not skip any nights and use it throughout the night if you can. Getting used to CPAP and staying with the treatment long term does take time and patience and discipline. Untreated obstructive sleep apnea when it is moderate to severe can have an adverse impact on cardiovascular health and raise her risk for heart disease, arrhythmias, hypertension, congestive heart failure, stroke and diabetes. Untreated obstructive sleep apnea causes sleep disruption, nonrestorative sleep, and sleep deprivation. This can have an impact on your day to day functioning and cause daytime sleepiness and impairment of cognitive function, memory loss, mood disturbance, and problems focussing. Using CPAP regularly can improve these symptoms.  Keep up the good work! You can see one of our nurse practitioners in 12 months for sleep apnea check up.   Please make enough time for sleep, keep a schedule. Keep well hydrated.

## 2016-11-09 NOTE — Progress Notes (Signed)
Angela [name]     MRN: [id number]      DOB: Oct 30, 1960   HPI Angela [name] is here for follow up and re-evaluation of chronic medical conditions, medication management and review of any available recent lab and radiology data.  Preventive health is updated, specifically  Cancer screening and Immunization.   Has decided against gyne surgery due to surgical risk [place] and c/o pain and reduced mobility in left wrist and hip, wants further evaluation  C/o chronic nasal congestion , clear drainage, using CPAP, feels better with it, no regular use of allergy medication The PT denies any adverse reactions to current medications since the last visit.  Chronic pain management is adequate C/o weight gain , will work more consistently on this ROS Denies recent fever or chills. Denies sinus pressure, nasal congestion, ear pain or sore throat. Denies chest congestion, productive cough or wheezing. Denies chest pains, palpitations and leg swelling Denies abdominal pain, nausea, vomiting,diarrhea or constipation.   Denies dysuria, frequency, hesitancy or incontinence.  Denies headaches, seizures, numbness, or tingling. Denies depression, anxiety or insomnia. Denies skin break down or rash.   PE  BP 140/82   Pulse 95   Resp 16   Ht 5\' 3"  (1.6 m)   Wt (!) 320 lb (145.2 kg)   SpO2 96%   BMI 56.69 kg/m   Patient alert and oriented and in no cardiopulmonary distress.  HEENT: No facial asymmetry, EOMI,   oropharynx pink and moist.  Neck supple no JVD, no mass.  Chest: Clear to auscultation bilaterally.  CVS: S1, S2 no murmurs, no S3.Regular rate.  ABD: Soft non tender.   Ext: No edema  MS: Decreased ROM spine, left  hips and left wrist which is slightly swollen and tender.  Skin: Intact, no ulcerations or rash noted.  Psych: Good eye contact, normal affect. Memory intact not anxious or depressed appearing.  CNS: CN 2-12 intact, power,  normal throughout.no focal deficits  noted.   Assessment & Plan  Encounter for pain management [place] registry reviewed and pt is compliant with her pain medication. She reports adequate pain control. Appropriate use of medication is reviewed and she she reports adequate pain control. 12 weeks of medication prescribed  Essential hypertension Improved tho still sub optimal Control, no change in medication DASH diet and commitment to daily physical activity for a minimum of 30 minutes discussed and encouraged, as a part of hypertension management. The importance of attaining a healthy weight is also discussed.  BP/Weight 11/06/2016 10/30/2016 09/15/2016 09/04/2016 09/01/2016 08/28/2016 9/41/7408  Systolic BP 144 818 563 149 702 637 858  Diastolic BP 82 88 80 86 73 80 82  Wt. (Lbs) 320 318.2 318.4 315 313.2 316.5 318  BMI 56.69 54.62 54.65 54.07 53.76 54.33 54.58       Diabetes mellitus without complication Angela [name] is reminded of the importance of commitment to daily physical activity for 30 minutes or more, as able and the need to limit carbohydrate intake to 30 to 60 grams per meal to help with blood sugar control.   The need to take medication as prescribed, test blood sugar as directed, and to call between visits if there is a concern that blood sugar is uncontrolled is also discussed.   Angela [name] is reminded of the importance of daily foot exam, annual eye examination, and good blood sugar, blood pressure and cholesterol control.  Diabetic Labs Latest Ref Rng & Units 08/21/2016 04/21/2016 04/19/2016 07/27/2015 10/14/2014  HbA1c <  5.7 % 6.8(H) - 6.7(H) 6.6(H) -  Microalbumin Not Estab. ug/mL - 41.2(H) - - -  Micro/Creat Ratio 0.0 - 30.0 mg/g creat - 22.2 - - -  Chol <200 mg/dL 167 - 144 145 -  HDL >50 mg/dL 50(L) - 46(L) 48 -  Calc LDL <100 mg/dL 96 - 82 81 -  Triglycerides <150 mg/dL 107 - 78 80 -  Creatinine 0.50 - 1.05 mg/dL 0.69 - 0.76 0.75 0.84   BP/Weight 11/06/2016 10/30/2016 09/15/2016 09/04/2016 09/01/2016 08/28/2016 4/56/2563   Systolic BP 893 734 287 681 157 262 035  Diastolic BP 82 88 80 86 73 80 82  Wt. (Lbs) 320 318.2 318.4 315 313.2 316.5 318  BMI 56.69 54.62 54.65 54.07 53.76 54.33 54.58   Foot/eye exam completion dates Latest Ref Rng & Units 11/06/2016 03/07/2016  Eye Exam No Retinopathy - No Retinopathy  Foot Form Completion - Done -   Updated lab needed at/ before next visit.       Chronic wrist pain, left Pain and reduced mobility s/p fall in May, refer to ortho for eval and management, also x ray today  Morbid obesity Deteriorated. Patient re-educated about  the importance of commitment to a  minimum of 150 minutes of exercise per week.  The importance of healthy food choices with portion control discussed. Encouraged to start a food diary, count calories and to consider  joining a support group. Sample diet sheets offered. Goals set by the patient for the next several months.   Weight /BMI 11/06/2016 10/30/2016 09/15/2016  WEIGHT 320 lb 318 lb 3.2 oz 318 lb 6.4 oz  HEIGHT 5\' 3"  - -  BMI 56.69 kg/m2 54.62 kg/m2 54.65 kg/m2      Hyperlipidemia LDL goal <70 Hyperlipidemia:Low fat diet discussed and encouraged.   Lipid Panel  Lab Results  Component Value Date   CHOL 167 08/21/2016   HDL 50 (L) 08/21/2016   LDLCALC 96 08/21/2016   TRIG 107 08/21/2016   CHOLHDL 3.3 08/21/2016       Chronic left hip pain Increased pain s/p fall in May, needs xray and ortho eval  Allergic rhinitis UNCONTROLLED, NEEDS TO COMMIT TO DAILY USE OF MEDICATION WHICH IS PRESCRIBED, PT EDUCATED REGARDING THIS  Sleep apnea syndrome Treated and feels better now that she is compliant

## 2016-11-09 NOTE — Assessment & Plan Note (Signed)
[name] is reminded of the importance of commitment to daily physical activity for 30 minutes or more, as able and the need to limit carbohydrate intake to 30 to 60 grams per meal to help with blood sugar control.   The need to take medication as prescribed, test blood sugar as directed, and to call between visits if there is a concern that blood sugar is uncontrolled is also discussed.   [name] is reminded of the importance of daily foot exam, annual eye examination, and good blood sugar, blood pressure and cholesterol control.  Diabetic Labs Latest Ref Rng & Units 08/21/2016 04/21/2016 04/19/2016 07/27/2015 10/14/2014  HbA1c <5.7 % 6.8(H) - 6.7(H) 6.6(H) -  Microalbumin Not Estab. ug/mL - 41.2(H) - - -  Micro/Creat Ratio 0.0 - 30.0 mg/g creat - 22.2 - - -  Chol <200 mg/dL 167 - 144 145 -  HDL >50 mg/dL 50(L) - 46(L) 48 -  Calc LDL <100 mg/dL 96 - 82 81 -  Triglycerides <150 mg/dL 107 - 78 80 -  Creatinine 0.50 - 1.05 mg/dL 0.69 - 0.76 0.75 0.84   BP/Weight 11/06/2016 10/30/2016 09/15/2016 09/04/2016 09/01/2016 08/28/2016 05/21/6242  Systolic BP 695 072 257 505 183 358 251  Diastolic BP 82 88 80 86 73 80 82  Wt. (Lbs) 320 318.2 318.4 315 313.2 316.5 318  BMI 56.69 54.62 54.65 54.07 53.76 54.33 54.58   Foot/eye exam completion dates Latest Ref Rng & Units 11/06/2016 03/07/2016  Eye Exam No Retinopathy - No Retinopathy  Foot Form Completion - Done -   Updated lab needed at/ before next visit.

## 2016-11-09 NOTE — Assessment & Plan Note (Signed)
Increased pain s/p fall in May, needs xray and ortho eval

## 2016-11-09 NOTE — Assessment & Plan Note (Signed)
Pain and reduced mobility s/p fall in May, refer to ortho for eval and management, also x ray today

## 2016-11-09 NOTE — Progress Notes (Signed)
Subjective:    [name] is a 56 y.o. female.  HPI      Interim history:   Angela [name] is a 56 year old right-handed woman with an underlying medical history of recurrent vertigo, allergic rhinitis, asthma, hypertension, chronic back pain, s/p 4 back surgeries (Dr. Joya Salm x 2, Dr. Harl Bowie x 2), on chronic narcotic pain medication, and morbid obesity, who presents for follow-up consultation of her OSA, after her sleep study last year. The patient is unaccompanied today. I first met her on 12/29/2015 at the request of her primary care physician, at which time she reported snoring and excessive daytime somnolence as well as multiple nighttime awakenings. She was on narcotic pain medication at the time through Dr. Brien Few. I invited her for a sleep study. She had a split-night sleep study on 02/03/2016. Baseline sleep efficiency was 71.8%, sleep latency prolonged at 40.5 minutes, REM sleep and slow-wave sleep were absent. Average oxygen saturation was 97%, nadir was 86% during non-REM sleep, total AHI 36.7 per hour. She was  titrated on CPAP from 5 cm to 10 cm. She did not achieve REM sleep during the titration portion of the study, average oxygen saturation was 90%, nadir was 87%. She did not have any significant PLMS or EKG changes during the study. Based on her test results are prescribed CPAP therapy for home use.  Today, 11/09/2016 (all dictated new, as well as above notes, some dictation done in note pad or Word, outside of chart, may appear as copied):  I reviewed her CPAP compliance data from 10/09/2016 through 11/07/2016, which is a total of 30 days, during which time she used her CPAP every night with percent used days greater than 4 hours at 83%, indicating very good compliance with an average usage of 6 hours and 36 minutes, residual AHI 1.9 per hour, leak acceptable for the 95th percentile at 10.9 L/m on a pressure of 10 cm with EPR of 3. She reports she recently fell in May, in  the house. She reports sleeping better with the CPAP. She used to sleep in a recliner and now is able to sleep in the bed. She feels better rested. She tries to get about 7 hours of sleep. Her nocturia has improved. She does report that it took her a little while to get the CPAP machine.  Previously (copied from previous notes for reference):   12/29/2015: (She) reports snoring and excessive daytime somnolence. I reviewed your office note from 12/03/2015. She reports multiple nighttime awakenings and also waking up with a sense of choking, wakes up coughing as well. Asthma has been worsening. She has been oxycontin bid, but not when planning to drive.  She takes zanaflex qid. She has nocturia up to 6 times per night.    She sleeps elevated with up to 6 pillows. Her Epworth sleepiness score is 7 out of 24 today, her fatigue score is 29 out of 63. She does not keep a set bedtime and wake time. May be in bed by 7:30 PM, wakeup time around 6 AM perhaps. She has seen Dr. Halford Chessman and was supposed to have a sleep study in the past but decided not to pursue it. She is still reluctant to come back for sleep study as she does not want anybody watching her sleep and she does not think she would be able to sleep. Nevertheless, she will think about it. She is congested currently in says she has a head cold. She has no history of  restless leg symptoms. She has no family history of OSA. Mother died of cervical cancer and father died of prostate cancer. She is one of a total of 8 kids, none of her 7 siblings have OSA. She has one daughter, age 77, who lives locally. She has no grandchildren. She does not work. 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 She is a nonsmoker and does not drink alcohol. She drinks several sodas per day. She lives alone.  Her Past Medical History Is Significant For: Past Medical History:  Diagnosis Date  . Anxiety   . Arthritis    . Asthma   . Coronary atherosclerosis of native coronary artery    Mild nonobstructive CAD by cardiac catheterization 2008 - Dr. Terrence Dupont  . Degenerative disc disease   . Depression   . Dysrhythmia   . Essential hypertension, benign   . Folliculitis 0/99/8338  . GERD (gastroesophageal reflux disease)   . H/O hiatal hernia   . Helicobacter pylori gastritis 2008  . Hypercholesterolemia   . Hypertension   . Hyperthyroidism    s/p radiation  . Low back pain   . Migraine   . Nephrolithiasis    Recurring episodes since 2004  . Severe obesity (BMI >= 40) (HCC)   . Sleep apnea    STOP BANG SCORE 6no cpap used  . Type 2 diabetes mellitus with diabetic neuropathy (HCC)     Her Past Surgical History Is Significant For: Past Surgical History:  Procedure Laterality Date  . ABDOMINAL HYSTERECTOMY    . BACK SURGERY  1993  . BACK SURGERY  1994  . BACK SURGERY  2002  . BACK SURGERY  2011   [place]  . COLONOSCOPY  2000 [facility]   INT HEMORRHOIDS/FISSURE  . COLONOSCOPY  2003 [facility], CHANGE IN BOWEL HABITS   INT HEMORRHOIDS  . COLONOSCOPY  2006 [facility]   INT HEMORRHOIDS  . COLONOSCOPY  2007 [facility]   INT HEMORRHOIDS  . CYSTOSCOPY WITH STENT PLACEMENT Left 07/25/2012   Procedure: CYSTOSCOPY, left retograde pyelogram WITH left ureteral  STENT PLACEMENT;  Surgeon: Claybon Jabs, MD;  Location: [facility];  Service: Urology;  Laterality: Left;  . CYSTOSCOPY/RETROGRADE/URETEROSCOPY  12/04/2011   Procedure: CYSTOSCOPY/RETROGRADE/URETEROSCOPY;  Surgeon: Molli Hazard, MD;  Location: [facility];  Service: Urology;  Laterality: Right;  . CYSTOSCOPY/RETROGRADE/URETEROSCOPY/STONE EXTRACTION WITH BASKET Left 09/03/2012   Procedure: CYSTOSCOPY/RETROGRADE pyelogram/digital URETEROSCOPY/STONE EXTRACTION WITH BASKET, left stent removal;  Surgeon: Molli Hazard, MD;  Location: [facility];  Service: Urology;  Laterality: Left;  . ESOPHAGOGASTRODUODENOSCOPY  2008   [facility]:NLZJQ hiatal  hernia./Normal esophagus without evidence of Barrett's mass, stricture, erosion or ulceration./Normal duodenal bulb and second portion of the duodenum./Diffuse erythema in the body and the antrum with occasional erosion.  Biopsies obtained via cold forceps to evaluate for H. pylori gastritis  . FRACTURE SURGERY  1999   right clavicle  . HOLMIUM LASER APPLICATION Left 11/01/4191   Procedure: HOLMIUM LASER APPLICATION;  Surgeon: Molli Hazard, MD;  Location: [facility];  Service: Urology;  Laterality: Left;  . KNEE ARTHROSCOPY  10/04/2010,    right knee arthroscopy, dr Theda Sers  . left knee arthroscopic surgery  1999  . Left salphingectomy secondary to ectopic pregnancy  1991  . PARTIAL HYSTERECTOMY  1991  . rotary cuff  Right 05/14/2014   [facility]  . UPPER GASTROINTESTINAL ENDOSCOPY  2008 abd pain  H. pylori gastritis  . UPPER GASTROINTESTINAL ENDOSCOPY  1996 [facility] [place]   [facility]    Her Family History Is Significant For: Family History  Problem Relation Age of Onset  . Heart disease Mother   . Hypertension Mother   . Cancer Mother        Cervical   . Asthma Mother   . Heart attack Father   . Cancer Father        Prostate  . Colon cancer Father        DECEASED AGE 78  . Colon polyps Neg Hx     Her Social History Is Significant For: Social History   Social History  . Marital status: Single    Spouse name: N/A  . Number of children: 1  . Years of education: 48   Occupational History  . Disable     Social History Main Topics  . Smoking status: Never Smoker  . Smokeless tobacco: Never Used  . Alcohol use No  . Drug use: No  . Sexual activity: Not Currently    Partners: Male    Birth control/ protection: Surgical     Comment: hyst   Other Topics Concern  . None   Social History Narrative   Drinks soda daily     Her Allergies Are:  Allergies  Allergen Reactions  . Morphine Other (See Comments)    Reaction with esophagus, unable to swallow.   . Ace Inhibitors  Cough  . Aspirin Other (See Comments)    Reaction with esophagus, unable to swallow.   . Losartan Cough  . Tapentadol Other (See Comments)    Nausea, increased sleepiness, h/a  . Tomato Other (See Comments)    Acid reflux due to acid in tomato  . Adhesive [Tape] Rash  :   Her Current Medications Are:  Outpatient Encounter Prescriptions as of 11/09/2016  Medication Sig  . albuterol (PROAIR HFA) 108 (90 BASE) MCG/ACT inhaler Inhale 2 puffs into the lungs every 6 (six) hours as needed for wheezing or shortness of breath.  Marland Kitchen amLODipine (NORVASC) 10 MG tablet Take 1 tablet (10 mg total) by mouth daily.  Marland Kitchen atorvastatin (LIPITOR) 80 MG tablet TAKE 1 TABLET BY MOUTH EVERY DAY  . azelastine (ASTELIN) 0.1 % nasal spray Place 2 sprays into both nostrils 2 (two) times daily. Use in each nostril as directed  . canagliflozin (INVOKANA) 300 MG TABS tablet Take 300 mg by mouth daily before breakfast.  . cloNIDine (CATAPRES - DOSED IN MG/24 HR) 0.2 mg/24hr patch Place 1 patch (0.2 mg total) onto the skin once a week.  . cloNIDine (CATAPRES) 0.3 MG tablet TAKE ONE TABLET AT 9 PM EVERY NIGHT FOR HIGH BLOOD PRESSURE  . cyclobenzaprine (FLEXERIL) 10 MG tablet TAKE 1 TABLET BY MOUTH 3 TIMES A DAY  . diazepam (VALIUM) 5 MG tablet TAKE 1 TABLET BY MOUTH 3 TIMES A DAY  . DULoxetine (CYMBALTA) 60 MG capsule Take 1 capsule (60 mg total) by mouth 2 (two) times daily.  . ergocalciferol (VITAMIN D2) 50000 units capsule Take 1 capsule (50,000 Units total) by mouth once a week. One capsule once weekly  . esomeprazole (NEXIUM) 40 MG capsule TAKE 1 CAPSULE (40 MG TOTAL) BY MOUTH DAILY.  . fluticasone (FLONASE) 50 MCG/ACT nasal spray Place 2 sprays into both nostrils daily. (Patient taking differently: Place 2 sprays into both nostrils as needed. )  . furosemide (LASIX) 40 MG tablet TAKE 1 TABLET BY MOUTH TWICE A DAY  . gabapentin (  NEURONTIN) 800 MG tablet TAKE 1 TABLET BY MOUTH 4 TIMES A DAY  . hydrocortisone (ANUSOL-HC)  2.5 % rectal cream Place 1 application rectally 4 (four) times daily. FOR 12 DAYS  . ipratropium-albuterol (DUONEB) 0.5-2.5 (3) MG/3ML SOLN Take 3 mLs by nebulization every 6 (six) hours as needed.  Marland Kitchen KLOR-CON M20 20 MEQ tablet TAKE 1 TABLET (20 MEQ TOTAL) BY MOUTH 2 (TWO) TIMES DAILY.  Marland Kitchen loratadine (CLARITIN) 10 MG tablet Take 1 tablet (10 mg total) by mouth daily.  . metFORMIN (GLUCOPHAGE) 1000 MG tablet TAKE 1 TABLET BY MOUTH TWICE A DAY WITH FOOD  . montelukast (SINGULAIR) 10 MG tablet TAKE 1 TABLET (10 MG TOTAL) BY MOUTH AT BEDTIME.  Marland Kitchen NITROSTAT 0.4 MG SL tablet PLACE 1 TABLET UNDER TONGUE IF NEEDED FOR CHEST PAIN..MAX OF 3 TIMES  . oxyCODONE (OXYCONTIN) 40 mg 12 hr tablet Take 1 tablet (40 mg total) by mouth every 12 (twelve) hours.  Marland Kitchen PROAIR HFA 108 (90 BASE) MCG/ACT inhaler INHALE 2 PUFFS BY MOUTH EVERY 6-8HRS AS NEEDED  . SYMBICORT 160-4.5 MCG/ACT inhaler INHALE 2 PUUFS BY MOUTH TWICE DAILY  . triamcinolone cream (KENALOG) 0.1 % Apply 1 application topically 2 (two) times daily.  Marland Kitchen triamterene-hydrochlorothiazide (MAXZIDE) 75-50 MG tablet TAKE 1 TABLET BY MOUTH DAILY.  Marland Kitchen zolpidem (AMBIEN) 10 MG tablet TAKE 1 TABLET BY MOUTH AT BEDTIME   No facility-administered encounter medications on file as of 11/09/2016.   : Review of Systems:  Out of a complete 14 point review of systems, all are reviewed and negative with the exception of these symptoms as listed below:  Review of Systems  Neurological:       Pt presents today to discuss her cpap. Pt says that her cpap is going well.    Objective:  Neurological Exam  Physical Exam Physical Examination:   Vitals:   11/09/16 1411  BP: (!) 174/88  Pulse: 97   General Examination: The patient is a very pleasant 56 y.o. female in no acute distress. She appears well-developed and well-nourished and well groomed.She admits that she did not take her blood pressure medication this morning. She says she was in a rush.   HEENT: Normocephalic,  atraumatic, pupils are equal, round and reactive to light and accommodation. Funduscopic exam is normal with sharp disc margins noted. Extraocular tracking is good without limitation to gaze excursion or nystagmus noted. Normal smooth pursuit is noted. Hearing is grossly intact. Face is symmetric with normal facial animation and normal facial sensation. Speech is clear with no dysarthria noted. There is no hypophonia. There is no lip, neck/head, jaw or voice tremor. Neck is supple with full range of passive and active motion. There are no carotid bruits on auscultation. Oropharynx exam reveals: mild to moderate mouth dryness, adequate dental hygiene and marked airway crowding. Mallampati is class III. Tongue protrudes centrally and palate elevates symmetrically.   Chest: Clear to auscultation without wheezing, rhonchi or crackles noted.  Heart: S1+S2+0, regular and normal without murmurs, rubs or gallops noted.   Abdomen: Soft, non-tender and non-distended with normal bowel sounds appreciated on auscultation.  Extremities: There is no pitting edema in the distal lower extremities bilaterally.  Skin: Warm and dry without trophic changes noted.   Musculoskeletal: exam reveals LBP, and bilateral knee pain, L wrist pain.  Neurologically:  Mental status: The patient is awake, alert and oriented in all 4 spheres. Her immediate and remote memory, attention, language skills and fund of knowledge are appropriate. There is no  evidence of aphasia, agnosia, apraxia or anomia. Speech is clear with normal prosody and enunciation. Thought process is linear. Mood is normal and affect is normal.  Cranial nerves II - XII are as described above under HEENT exam. In addition: shoulder shrug is normal with equal shoulder height noted. Motor exam: Normal bulk, strength and tone is noted. There is no drift, tremor or rebound. Romberg is not tested for safety. Reflexes are 1+ in the UEs and trace in the LEs. Fine  motor skills and coordination: intact with normal finger taps, normal hand movements, normal rapid alternating patting, normal foot taps and normal foot agility.  Cerebellar testing: No dysmetria or intention tremor.   Sensory exam: intact to light touch.  Gait, station and balance: She stands with difficulty, walks with a limp, slight waddle.   Assessment and Plan:  In summary, Angela [name] is a very pleasant 56 year old female with an underlying medical history of recurrent vertigo, allergic rhinitis, asthma, hypertension, arthritis, s/p b/l knee injections, R shoulder surgery, chronic LBP with s/p 4 back surgeries, on chronic narcotic pain medication, and morbid obesity, who presents for follow-up consultation of her severe obstructive sleep apnea. She had split-night sleep study testing in October 2017. We talked about her test results in detail today. She did well with CPAP therapy. She is compliant with it and commended for this. She is encouraged to be fully compliant with treatment ongoing. She is furthermore strongly advised to continue to work on weight loss. Her weight has been fluctuating some. She is also reminded to be compliant with her blood pressure medication and not skip it. We talked about her recent compliance data. Physical exam is stable with the exception of left wrist pain reported after a fall in May 2018. She is scheduled to see an orthopedic doctor for this. From my end of things I suggested a one-year checkup, she can see what of her nurse practitioners at the time as she is doing well from the sleep apnea standpoint. She has noticed improvements in several areas including sleep consolidation, daytime energy level, and nocturia. She is advised regarding fall risk. She is advised to be mindful of her risk for falls. She is also taking several potentially sedating medications. I answered all her questions today and she was in agreement with the plan.  I spent 25 minutes in total  face-to-face time with the patient, more than 50% of which was spent in counseling and coordination of care, reviewing test results, reviewing medication and discussing or reviewing the diagnosis of OSA, its prognosis and treatment options. Pertinent laboratory and imaging test results that were available during this visit with the patient were reviewed by me and considered in my medical decision making (see chart for details).

## 2016-11-09 NOTE — Assessment & Plan Note (Signed)
Deteriorated. Patient re-educated about  the importance of commitment to a  minimum of 150 minutes of exercise per week.  The importance of healthy food choices with portion control discussed. Encouraged to start a food diary, count calories and to consider  joining a support group. Sample diet sheets offered. Goals set by the patient for the next several months.   Weight /BMI 11/06/2016 10/30/2016 09/15/2016  WEIGHT 320 lb 318 lb 3.2 oz 318 lb 6.4 oz  HEIGHT 5\' 3"  - -  BMI 56.69 kg/m2 54.62 kg/m2 54.65 kg/m2

## 2016-11-09 NOTE — Assessment & Plan Note (Signed)
[place] registry reviewed and pt is compliant with her pain medication. She reports adequate pain control. Appropriate use of medication is reviewed and she she reports adequate pain control. 12 weeks of medication prescribed

## 2016-11-09 NOTE — Assessment & Plan Note (Signed)
Treated and feels better now that she is compliant

## 2016-11-09 NOTE — Assessment & Plan Note (Signed)
UNCONTROLLED, NEEDS TO COMMIT TO DAILY USE OF MEDICATION WHICH IS PRESCRIBED, PT EDUCATED REGARDING THIS

## 2016-11-09 NOTE — Assessment & Plan Note (Signed)
Hyperlipidemia:Low fat diet discussed and encouraged.   Lipid Panel  Lab Results  Component Value Date   CHOL 167 08/21/2016   HDL 50 (L) 08/21/2016   LDLCALC 96 08/21/2016   TRIG 107 08/21/2016   CHOLHDL 3.3 08/21/2016

## 2016-11-16 ENCOUNTER — Ambulatory Visit (INDEPENDENT_AMBULATORY_CARE_PROVIDER_SITE_OTHER): Payer: Medicare Other

## 2016-11-16 ENCOUNTER — Encounter (INDEPENDENT_AMBULATORY_CARE_PROVIDER_SITE_OTHER): Payer: Self-pay | Admitting: Orthopaedic Surgery

## 2016-11-16 ENCOUNTER — Ambulatory Visit (INDEPENDENT_AMBULATORY_CARE_PROVIDER_SITE_OTHER): Payer: Medicare Other | Admitting: Orthopaedic Surgery

## 2016-11-16 VITALS — BP 133/83 | HR 82 | Ht 64.0 in | Wt 309.0 lb

## 2016-11-16 DIAGNOSIS — M25552 Pain in left hip: Secondary | ICD-10-CM

## 2016-11-16 DIAGNOSIS — M25532 Pain in left wrist: Secondary | ICD-10-CM | POA: Diagnosis not present

## 2016-11-16 DIAGNOSIS — G5602 Carpal tunnel syndrome, left upper limb: Secondary | ICD-10-CM | POA: Diagnosis not present

## 2016-11-16 DIAGNOSIS — G5601 Carpal tunnel syndrome, right upper limb: Secondary | ICD-10-CM | POA: Diagnosis not present

## 2016-11-16 DIAGNOSIS — G4700 Insomnia, unspecified: Secondary | ICD-10-CM | POA: Diagnosis not present

## 2016-12-03 ENCOUNTER — Other Ambulatory Visit: Payer: Self-pay | Admitting: Family Medicine

## 2016-12-04 ENCOUNTER — Other Ambulatory Visit: Payer: Self-pay | Admitting: Family Medicine

## 2016-12-04 NOTE — Progress Notes (Signed)
Office Visit Note   Patient: Angela [name]           Date of Birth: 22-Nov-1960           MRN: [id number] Visit Date: 11/16/2016              Requested by: Fayrene Helper, MD [address], [place],  [address] PCP: Fayrene Helper, MD   Assessment & Plan: Visit Diagnoses:  1. Pain in left wrist   2. Pain in left hip     Plan: X-rays do not demonstrate any acute fractures. We discussed medications that she is taking that may be contributing to falling. We discussed the following her diabetic diet. She has A1c's done regularly and has been compliant with the diabetic instructions. We discussed gradual working on some weight loss and extremely slow progressing activity program to help with strength and coordination as well as her balance. No additional diagnostic tests needed at this time. She can return as needed.  Follow-Up Instructions: No Follow-up on file.   Orders:  Orders Placed This Encounter  Procedures  . XR Wrist Complete Left  . XR Pelvis 1-2 Views   No orders of the defined types were placed in this encounter.     Procedures: No procedures performed   Clinical Data: No additional findings.   Subjective: Chief Complaint  Patient presents with  . Pelvis - Pain  . Left Wrist - Pain    HPI 56 year old female here with complaints of left lateral hip pain and lateral pelvic pain as well as left wrist pain after history of 2 falls. First falls 09/18/2016 and the second on 11/10/2016. She fell last Friday night catching her foot falling into the house. She landed on the left side of her pelvis with lateral hip pain. She was having some mild residual pain after first small and is been a Product manager with a walker. She takes OxyContin 40 mg every 12 hours after history of 4 back surgeries and she states she doesn't want anything done for her back. She is on chronic pain management. She does have diabetes, hyperlipidemia, morbid obesity, depression with  anxiety, hypertension, CAD. and also asthma  Review of Systems  Constitutional: Negative for chills and diaphoresis.  HENT: Negative for ear discharge, ear pain and nosebleeds.   Eyes: Positive for visual disturbance. Negative for discharge.  Respiratory: Negative for cough, choking and shortness of breath.   Cardiovascular: Negative for chest pain and palpitations.       Positive for coronary artery disease, hypertension and also hyperlipidemia.  Gastrointestinal: Negative for abdominal distention and abdominal pain.       Obesity  Endocrine: Negative for cold intolerance and heat intolerance.  Genitourinary: Negative for flank pain and hematuria.       History of kidney stones  Musculoskeletal: Positive for back pain.       She states she's had 4 back surgeries and is on chronic pain management for back  Skin: Negative for rash and wound.  Neurological: Negative for seizures and speech difficulty.  Hematological: Negative for adenopathy. Does not bruise/bleed easily.  Psychiatric/Behavioral: Negative for agitation and suicidal ideas.       Anxiety and depression   previous knee arthroscopies and also shoulder surgery.   Objective: Vital Signs: BP 133/83   Pulse 82   Ht 5\' 4"  (1.626 m)   Wt (!) 309 lb (140.2 kg)   BMI 53.04 kg/m   Physical Exam  Constitutional:  She is oriented to person, place, and time. She appears well-developed.  HENT:  Head: Normocephalic.  Right Ear: External ear normal.  Left Ear: External ear normal.  Eyes: Pupils are equal, round, and reactive to light.  Neck: No tracheal deviation present. No thyromegaly present.  Cardiovascular: Normal rate.   Pulmonary/Chest: Effort normal.  Abdominal: Soft.  Musculoskeletal:  Patient has some tenderness over the lateral aspect of her pelvis. Negative Trendelenburg gait. No pain with internal/external rotation of her hip. Greater trochanter is tender on the left. Left wrist shows some tenderness over the dorsum  of the wrist no tenderness over the scaphoid. No extensor synovitis no triggering of the fingers no thenar hyperthenar atrophy no bruising. Joints are stable in the digits. Sensation the fingertips is intact good cervical range of motion.  Neurological: She is alert and oriented to person, place, and time.  Skin: Skin is warm and dry.  Psychiatric: She has a normal mood and affect. Her behavior is normal.    Ortho Exam  Specialty Comments:  No specialty comments available.  Imaging: No results found.   PMFS History: Patient Active Problem List   Diagnosis Date Noted  . Encounter for pain management 11/09/2016  . Chronic wrist pain, left 11/06/2016  . Chronic left hip pain 11/06/2016  . Mass of right ovary 09/04/2016  . FH: colon cancer 09/01/2016  . Numbness of left hand 08/21/2016  . Allergic rhinitis 12/05/2015  . Upper airway cough syndrome 09/19/2015  . Back muscle spasm 09/14/2015  . Rash and nonspecific skin eruption 09/14/2015  . Knee pain, right 03/29/2015  . Cervical neck pain with evidence of disc disease 04/13/2014  . Coronary atherosclerosis of native coronary artery 05/26/2013  . Hemorrhoid 04/08/2012  . Vitamin D deficiency 04/04/2012  . Nephrolithiasis 12/10/2011  . Sleep apnea syndrome 12/05/2011  . GERD (gastroesophageal reflux disease) 08/16/2011  . Back pain 11/03/2010  . Hyperlipidemia LDL goal <70 08/11/2008  . Morbid obesity ([facility]) 01/06/2008  . Diabetes mellitus without complication ([facility]) 09/32/3557  . Depression with anxiety 05/05/2006  . Essential hypertension 05/05/2006  . Asthma 05/05/2006  . Helicobacter pylori gastritis 05/01/2006   Past Medical History:  Diagnosis Date  . Anxiety   . Arthritis   . Asthma   . Coronary atherosclerosis of native coronary artery    Mild nonobstructive CAD by cardiac catheterization 2008 - Dr. Terrence Dupont  . Degenerative disc disease   . Depression   . Dysrhythmia   . Essential hypertension, benign   .  Folliculitis 07/20/252  . GERD (gastroesophageal reflux disease)   . H/O hiatal hernia   . Helicobacter pylori gastritis 2008  . Hypercholesterolemia   . Hypertension   . Hyperthyroidism    s/p radiation  . Low back pain   . Migraine   . Nephrolithiasis    Recurring episodes since 2004  . Severe obesity (BMI >= 40) ([facility])   . Sleep apnea    STOP BANG SCORE 6no cpap used  . Type 2 diabetes mellitus with diabetic neuropathy ([facility])     Family History  Problem Relation Age of Onset  . Heart disease Mother   . Hypertension Mother   . Cancer Mother        Cervical   . Asthma Mother   . Heart attack Father   . Cancer Father        Prostate  . Colon cancer Father        DECEASED AGE 43  . Colon polyps Neg  Hx     Past Surgical History:  Procedure Laterality Date  . ABDOMINAL HYSTERECTOMY    . BACK SURGERY  1993  . BACK SURGERY  1994  . BACK SURGERY  2002  . BACK SURGERY  2011   [place]  . COLONOSCOPY  2000 [facility]   INT HEMORRHOIDS/FISSURE  . COLONOSCOPY  2003 [facility], CHANGE IN BOWEL HABITS   INT HEMORRHOIDS  . COLONOSCOPY  2006 [facility]   INT HEMORRHOIDS  . COLONOSCOPY  2007 [facility]   INT HEMORRHOIDS  . CYSTOSCOPY WITH STENT PLACEMENT Left 07/25/2012   Procedure: CYSTOSCOPY, left retograde pyelogram WITH left ureteral  STENT PLACEMENT;  Surgeon: Claybon Jabs, MD;  Location: [facility];  Service: Urology;  Laterality: Left;  . CYSTOSCOPY/RETROGRADE/URETEROSCOPY  12/04/2011   Procedure: CYSTOSCOPY/RETROGRADE/URETEROSCOPY;  Surgeon: Molli Hazard, MD;  Location: [facility];  Service: Urology;  Laterality: Right;  . CYSTOSCOPY/RETROGRADE/URETEROSCOPY/STONE EXTRACTION WITH BASKET Left 09/03/2012   Procedure: CYSTOSCOPY/RETROGRADE pyelogram/digital URETEROSCOPY/STONE EXTRACTION WITH BASKET, left stent removal;  Surgeon: Molli Hazard, MD;  Location: [facility];  Service: Urology;  Laterality: Left;  . ESOPHAGOGASTRODUODENOSCOPY  2008   [facility]:UXNAT hiatal  hernia./Normal esophagus without evidence of Barrett's mass, stricture, erosion or ulceration./Normal duodenal bulb and second portion of the duodenum./Diffuse erythema in the body and the antrum with occasional erosion.  Biopsies obtained via cold forceps to evaluate for H. pylori gastritis  . FRACTURE SURGERY  1999   right clavicle  . HOLMIUM LASER APPLICATION Left 09/02/7320   Procedure: HOLMIUM LASER APPLICATION;  Surgeon: Molli Hazard, MD;  Location: [facility];  Service: Urology;  Laterality: Left;  . KNEE ARTHROSCOPY  10/04/2010,    right knee arthroscopy, dr Theda Sers  . left knee arthroscopic surgery  1999  . Left salphingectomy secondary to ectopic pregnancy  1991  . PARTIAL HYSTERECTOMY  1991  . rotary cuff  Right 05/14/2014   [facility]  . UPPER GASTROINTESTINAL ENDOSCOPY  2008 abd pain   H. pylori gastritis  . UPPER GASTROINTESTINAL ENDOSCOPY  1996 [place] [place]   PUD   Social History   Occupational History  . Disable     Social History Main Topics  . Smoking status: Never Smoker  . Smokeless tobacco: Never Used  . Alcohol use No  . Drug use: No  . Sexual activity: Not Currently    Partners: Male    Birth control/ protection: Surgical     Comment: hyst

## 2016-12-05 ENCOUNTER — Ambulatory Visit: Payer: Medicare Other | Admitting: Obstetrics & Gynecology

## 2016-12-05 ENCOUNTER — Other Ambulatory Visit: Payer: Medicare Other

## 2016-12-05 DIAGNOSIS — G4733 Obstructive sleep apnea (adult) (pediatric): Secondary | ICD-10-CM | POA: Diagnosis not present

## 2016-12-11 ENCOUNTER — Telehealth: Payer: Self-pay | Admitting: Family Medicine

## 2016-12-11 ENCOUNTER — Other Ambulatory Visit: Payer: Self-pay | Admitting: Family Medicine

## 2016-12-11 DIAGNOSIS — G5602 Carpal tunnel syndrome, left upper limb: Secondary | ICD-10-CM

## 2016-12-11 NOTE — Progress Notes (Unsigned)
[facility] orth[facility]  

## 2016-12-11 NOTE — Telephone Encounter (Signed)
She is referred to Dr Amedeo Plenty, pls send the nerve conduction test result from Dr Freddie Apley office also to Dr  Amedeo Plenty

## 2016-12-11 NOTE — Telephone Encounter (Signed)
Patient would like a referral to Dr. Amedeo Plenty or Dr. Apolonio Schneiders at [facility].  She states that she will need carpal tunnel surgery per Dr. Merlene Laughter. She was sent to Dr Lorin Mercy at the [facility] location, but she had a bad experience there with him and does not wish to follow up.  She is familiar with Dr. Hillery Aldo at [facility] since he took care of her shoulder, but he doesn't specialize in carpal tunnel, but considering they were so nice to her there she would like to return even if it means traveling to [place].

## 2017-01-01 ENCOUNTER — Other Ambulatory Visit: Payer: Self-pay | Admitting: Family Medicine

## 2017-01-01 DIAGNOSIS — M62830 Muscle spasm of back: Secondary | ICD-10-CM

## 2017-01-03 ENCOUNTER — Other Ambulatory Visit: Payer: Self-pay | Admitting: Family Medicine

## 2017-01-03 NOTE — Telephone Encounter (Signed)
Seen 7 9 18 

## 2017-01-04 ENCOUNTER — Other Ambulatory Visit: Payer: Self-pay | Admitting: Family Medicine

## 2017-01-05 DIAGNOSIS — G4733 Obstructive sleep apnea (adult) (pediatric): Secondary | ICD-10-CM | POA: Diagnosis not present

## 2017-01-10 ENCOUNTER — Other Ambulatory Visit: Payer: Self-pay | Admitting: Family Medicine

## 2017-01-25 DIAGNOSIS — E785 Hyperlipidemia, unspecified: Secondary | ICD-10-CM | POA: Diagnosis not present

## 2017-01-25 DIAGNOSIS — E119 Type 2 diabetes mellitus without complications: Secondary | ICD-10-CM | POA: Diagnosis not present

## 2017-01-26 LAB — COMPLETE METABOLIC PANEL WITH GFR
AG Ratio: 1.4 (calc) (ref 1.0–2.5)
ALT: 22 U/L (ref 6–29)
AST: 17 U/L (ref 10–35)
Albumin: 4.2 g/dL (ref 3.6–5.1)
Alkaline phosphatase (APISO): 74 U/L (ref 33–130)
BUN: 14 mg/dL (ref 7–25)
CALCIUM: 9 mg/dL (ref 8.6–10.4)
CO2: 29 mmol/L (ref 20–32)
Chloride: 104 mmol/L (ref 98–110)
Creat: 0.71 mg/dL (ref 0.50–1.05)
GFR, EST AFRICAN AMERICAN: 110 mL/min/{1.73_m2} (ref >=60)
GFR, EST NON AFRICAN AMERICAN: 95 mL/min/{1.73_m2} (ref >=60)
GLOBULIN: 3.1 g/dL (ref 1.9–3.7)
Glucose, Bld: 114 mg/dL — ABNORMAL HIGH (ref 65–99)
Potassium: 4 mmol/L (ref 3.5–5.3)
SODIUM: 140 mmol/L (ref 135–146)
TOTAL PROTEIN: 7.3 g/dL (ref 6.1–8.1)
Total Bilirubin: 0.3 mg/dL (ref 0.2–1.2)

## 2017-01-26 LAB — LIPID PANEL
CHOLESTEROL: 151 mg/dL (ref <200)
HDL: 46 mg/dL — AB (ref >50)
LDL Cholesterol (Calc): 87 mg/dL (calc)
Non-HDL Cholesterol (Calc): 105 mg/dL (calc) (ref <130)
TRIGLYCERIDES: 90 mg/dL (ref <150)
Total CHOL/HDL Ratio: 3.3 (calc) (ref <5.0)

## 2017-01-26 LAB — HEMOGLOBIN A1C
HEMOGLOBIN A1C: 6.8 %{Hb} — AB (ref <5.7)
MEAN PLASMA GLUCOSE: 148 (calc)
eAG (mmol/L): 8.2 (calc)

## 2017-01-29 ENCOUNTER — Ambulatory Visit: Payer: Self-pay | Admitting: Family Medicine

## 2017-01-29 ENCOUNTER — Encounter: Payer: Self-pay | Admitting: Family Medicine

## 2017-01-29 ENCOUNTER — Ambulatory Visit (INDEPENDENT_AMBULATORY_CARE_PROVIDER_SITE_OTHER): Payer: Medicare Other | Admitting: Family Medicine

## 2017-01-29 VITALS — BP 146/92 | HR 86 | Temp 98.1°F | Resp 16 | Ht 64.0 in | Wt 308.0 lb

## 2017-01-29 DIAGNOSIS — K219 Gastro-esophageal reflux disease without esophagitis: Secondary | ICD-10-CM

## 2017-01-29 DIAGNOSIS — R52 Pain, unspecified: Secondary | ICD-10-CM | POA: Diagnosis not present

## 2017-01-29 DIAGNOSIS — F5105 Insomnia due to other mental disorder: Secondary | ICD-10-CM

## 2017-01-29 DIAGNOSIS — E785 Hyperlipidemia, unspecified: Secondary | ICD-10-CM | POA: Diagnosis not present

## 2017-01-29 DIAGNOSIS — E119 Type 2 diabetes mellitus without complications: Secondary | ICD-10-CM

## 2017-01-29 DIAGNOSIS — Z23 Encounter for immunization: Secondary | ICD-10-CM

## 2017-01-29 DIAGNOSIS — F409 Phobic anxiety disorder, unspecified: Secondary | ICD-10-CM | POA: Diagnosis not present

## 2017-01-29 DIAGNOSIS — R21 Rash and other nonspecific skin eruption: Secondary | ICD-10-CM | POA: Diagnosis not present

## 2017-01-29 DIAGNOSIS — I10 Essential (primary) hypertension: Secondary | ICD-10-CM

## 2017-01-29 MED ORDER — CLONIDINE HCL 0.3 MG/24HR TD PTWK: 0.3000 mg | MEDICATED_PATCH | TRANSDERMAL | 12 refills | Status: DC

## 2017-01-29 MED ORDER — DIAZEPAM 5 MG PO TABS: 5.0000 mg | ORAL_TABLET | Freq: Three times a day (TID) | ORAL | 0 refills | Status: DC

## 2017-01-29 MED ORDER — ZOLPIDEM TARTRATE 10 MG PO TABS: 10.0000 mg | ORAL_TABLET | Freq: Every day | ORAL | 2 refills | Status: DC

## 2017-01-29 MED ORDER — OXYCODONE HCL ER 40 MG PO T12A
40.0000 mg | EXTENDED_RELEASE_TABLET | Freq: Two times a day (BID) | ORAL | 0 refills | Status: DC
Start: 2017-01-29 — End: 2017-01-29

## 2017-01-29 MED ORDER — OXYCODONE HCL ER 40 MG PO T12A: 40.0000 mg | EXTENDED_RELEASE_TABLET | Freq: Two times a day (BID) | ORAL | 0 refills | Status: DC

## 2017-01-29 NOTE — Assessment & Plan Note (Signed)
Controlled, no change in medication Angela [name] is reminded of the importance of commitment to daily physical activity for 30 minutes or more, as able and the need to limit carbohydrate intake to 30 to 60 grams per meal to help with blood sugar control.   The need to take medication as prescribed, test blood sugar as directed, and to call between visits if there is a concern that blood sugar is uncontrolled is also discussed.   Angela [name] is reminded of the importance of daily foot exam, annual eye examination, and good blood sugar, blood pressure and cholesterol control.  Diabetic Labs Latest Ref Rng & Units 01/25/2017 08/21/2016 04/21/2016 04/19/2016 07/27/2015  HbA1c <5.7 % of total Hgb 6.8(H) 6.8(H) - 6.7(H) 6.6(H)  Microalbumin Not Estab. ug/mL - - 41.2(H) - -  Micro/Creat Ratio 0.0 - 30.0 mg/g creat - - 22.2 - -  Chol <200 mg/dL 151 167 - 144 145  HDL >50 mg/dL 46(L) 50(L) - 46(L) 48  Calc LDL <100 mg/dL - 96 - 82 81  Triglycerides <150 mg/dL 90 107 - 78 80  Creatinine 0.50 - 1.05 mg/dL 0.71 0.69 - 0.76 0.75   BP/Weight 01/29/2017 11/16/2016 11/09/2016 11/06/2016 10/30/2016 12/23/537 11/04/7339  Systolic BP 937 902 409 735 329 924 268  Diastolic BP 92 83 88 82 88 80 86  Wt. (Lbs) 308 309 310 320 318.2 318.4 315  BMI 52.87 53.04 53.21 56.69 54.62 54.65 54.07   Foot/eye exam completion dates Latest Ref Rng & Units 11/06/2016 03/07/2016  Eye Exam No Retinopathy - No Retinopathy  Foot Form Completion - Done -    Needs eye exam in November

## 2017-01-29 NOTE — Patient Instructions (Addendum)
F/u December 27 , call if you need me sooner  Mammogram appt to be scheduled at checkout as past due  You are referred for ey exam to [facility] , in [facility] Dec 22 or shortly after, can be collected a at the visit  Congrats on 11 pound  weight loss since March, keep it up  Flu vaccine today  Blood pressure elevated, continue to change diet, more plants , less processed foods, more water, smaller portions  INCREASED dose of catapres to 0.3 mg per 24 hr when you next fill this week  Medications will continue as before  It is important that you exercise regularly at least 30 minutes 5 times a week. If you develop chest pain, have severe difficulty breathing, or feel very tired, stop exercising immediately and seek medical attention

## 2017-01-31 ENCOUNTER — Ambulatory Visit: Payer: Medicare Other | Admitting: Obstetrics and Gynecology

## 2017-02-02 ENCOUNTER — Ambulatory Visit (HOSPITAL_COMMUNITY): Payer: Self-pay

## 2017-02-04 DIAGNOSIS — G4733 Obstructive sleep apnea (adult) (pediatric): Secondary | ICD-10-CM | POA: Diagnosis not present

## 2017-02-05 ENCOUNTER — Ambulatory Visit (HOSPITAL_COMMUNITY): Payer: Self-pay

## 2017-02-07 ENCOUNTER — Ambulatory Visit (HOSPITAL_COMMUNITY)
Admission: RE | Admit: 2017-02-07 | Discharge: 2017-02-07 | Disposition: A | Discharge status: Home / Self Care | Payer: Medicare Other | Source: Ambulatory Visit | Attending: Family Medicine | Admitting: Family Medicine

## 2017-02-07 DIAGNOSIS — Z1231 Encounter for screening mammogram for malignant neoplasm of breast: Secondary | ICD-10-CM | POA: Diagnosis not present

## 2017-02-11 ENCOUNTER — Encounter: Payer: Self-pay | Admitting: Family Medicine

## 2017-02-11 DIAGNOSIS — F409 Phobic anxiety disorder, unspecified: Secondary | ICD-10-CM | POA: Insufficient documentation

## 2017-02-11 DIAGNOSIS — F5105 Insomnia due to other mental disorder: Secondary | ICD-10-CM

## 2017-02-11 NOTE — Assessment & Plan Note (Signed)
Hyperlipidemia:Low fat diet discussed and encouraged.   Lipid Panel  Lab Results  Component Value Date   CHOL 151 01/25/2017   HDL 46 (L) 01/25/2017   LDLCALC 96 08/21/2016   TRIG 90 01/25/2017   CHOLHDL 3.3 01/25/2017   Needs to commit to regular exercise , low HDL

## 2017-02-11 NOTE — Assessment & Plan Note (Signed)
The patient's Controlled Substance registry is reviewed and compliance confirmed. Adequacy of  Pain control and level of function is assessed. Medication dosing is adjusted as deemed appropriate. Twelve weeks of medication is prescribed , patient signs for the script and is provided with a follow up appointment between 11 to 12 weeks .  

## 2017-02-11 NOTE — Assessment & Plan Note (Signed)
Controlled, no change in medication  

## 2017-02-11 NOTE — Assessment & Plan Note (Signed)
Sleep hygiene reviewed and written information offered also. Prescription sent for  medication needed.  

## 2017-02-11 NOTE — Assessment & Plan Note (Addendum)
Uncontrolled, dose increase in catapres patch. DASH diet and commitment to daily physical activity for a minimum of 30 minutes discussed and encouraged, as a part of hypertension management. The importance of attaining a healthy weight is also discussed.  BP/Weight 01/29/2017 11/16/2016 11/09/2016 11/06/2016 10/30/2016 2/64/1583 0/12/4074  Systolic BP 808 811 031 594 585 929 244  Diastolic BP 92 83 88 82 88 80 86  Wt. (Lbs) 308 309 310 320 318.2 318.4 315  BMI 52.87 53.04 53.21 56.69 54.62 54.65 54.07

## 2017-02-11 NOTE — Assessment & Plan Note (Addendum)
Unchanged Patient re-educated about  the importance of commitment to a  minimum of 150 minutes of exercise per week.  The importance of healthy food choices with portion control discussed. Encouraged to start a food diary, count calories and to consider  joining a support group. Sample diet sheets offered. Goals set by the patient for the next several months.   Weight /BMI 01/29/2017 11/16/2016 11/09/2016  WEIGHT 308 lb 309 lb 310 lb  HEIGHT 5\' 4"  5\' 4"  5\' 4"   BMI 52.87 kg/m2 53.04 kg/m2 53.21 kg/m2

## 2017-02-11 NOTE — Assessment & Plan Note (Signed)
Increased rash and hyperpigmentation on both lower extremities , needs dermatology re evaluation will refer to Dr Nevada Crane again

## 2017-02-11 NOTE — Progress Notes (Signed)
Angela [name]     MRN: [id number]      DOB: 1961/02/07   HPI Angela [name] is here for follow up and re-evaluation of chronic medical conditions,in particular chronic pain management  medication management and review of any available recent lab and radiology data.  Preventive health is updated, specifically  Cancer screening and Immunization.   Questions or concerns regarding consultations or procedures which the PT has had in the interim are  addressed. The PT denies any adverse reactions to current medications since the last visit.  C/o worsening of the rash on her legs , wants to have dermatology re evaluate Denies polyuria, polydipsia, blurred vision , or hypoglycemic episodes.   ROS Denies recent fever or chills. Denies sinus pressure, nasal congestion, ear pain or sore throat. Denies chest congestion, productive cough or wheezing. Denies chest pains, palpitations and leg swelling Denies abdominal pain, nausea, vomiting,diarrhea or constipation.   Denies dysuria, frequency, hesitancy or incontinence. Denies  Uncontrolled joint pain, swelling and limitation in mobility. Denies headaches, seizures, numbness, or tingling. Denies depression, anxiety or insomnia.    PE  BP (!) 146/92   Pulse 86   Temp 98.1 F (36.7 C) (Other (Comment))   Resp 16   Ht 5\' 4"  (1.626 m)   Wt (!) 308 lb (139.7 kg)   SpO2 97%   BMI 52.87 kg/m   Patient alert and oriented and in no cardiopulmonary distress.  HEENT: No facial asymmetry, EOMI,   oropharynx pink and moist.  Neck supple no JVD, no mass.  Chest: Clear to auscultation bilaterally.  CVS: S1, S2 no murmurs, no S3.Regular rate.  ABD: Soft non tender.   Ext: No edema, hyperpigmented macular rash on both shins MS: decreased ROM spine, shoulders, hips and knees.  Skin: hyperpigmented rash noted.  Psych: Good eye contact, normal affect. Memory intact not anxious or depressed appearing.  CNS: CN 2-12 intact, power,  normal  throughout.no focal deficits noted.   Assessment & Plan  Diabetes mellitus without complication Controlled, no change in medication Angela [name] is reminded of the importance of commitment to daily physical activity for 30 minutes or more, as able and the need to limit carbohydrate intake to 30 to 60 grams per meal to help with blood sugar control.   The need to take medication as prescribed, test blood sugar as directed, and to call between visits if there is a concern that blood sugar is uncontrolled is also discussed.   Angela [name] is reminded of the importance of daily foot exam, annual eye examination, and good blood sugar, blood pressure and cholesterol control.  Diabetic Labs Latest Ref Rng & Units 01/25/2017 08/21/2016 04/21/2016 04/19/2016 07/27/2015  HbA1c <5.7 % of total Hgb 6.8(H) 6.8(H) - 6.7(H) 6.6(H)  Microalbumin Not Estab. ug/mL - - 41.2(H) - -  Micro/Creat Ratio 0.0 - 30.0 mg/g creat - - 22.2 - -  Chol <200 mg/dL 151 167 - 144 145  HDL >50 mg/dL 46(L) 50(L) - 46(L) 48  Calc LDL <100 mg/dL - 96 - 82 81  Triglycerides <150 mg/dL 90 107 - 78 80  Creatinine 0.50 - 1.05 mg/dL 0.71 0.69 - 0.76 0.75   BP/Weight 01/29/2017 11/16/2016 11/09/2016 11/06/2016 10/30/2016 7/61/6073 10/29/624  Systolic BP 948 546 270 350 093 818 299  Diastolic BP 92 83 88 82 88 80 86  Wt. (Lbs) 308 309 310 320 318.2 318.4 315  BMI 52.87 53.04 53.21 56.69 54.62 54.65 54.07   Foot/eye exam completion dates Latest  Ref Rng & Units 11/06/2016 03/07/2016  Eye Exam No Retinopathy - No Retinopathy  Foot Form Completion - Done -    Needs eye exam in November     Essential hypertension Uncontrolled, dose increase in catapres patch. DASH diet and commitment to daily physical activity for a minimum of 30 minutes discussed and encouraged, as a part of hypertension management. The importance of attaining a healthy weight is also discussed.  BP/Weight 01/29/2017 11/16/2016 11/09/2016 11/06/2016 10/30/2016 09/15/2016 10/04/628    Systolic BP 160 109 323 557 322 025 427  Diastolic BP 92 83 88 82 88 80 86  Wt. (Lbs) 308 309 310 320 318.2 318.4 315  BMI 52.87 53.04 53.21 56.69 54.62 54.65 54.07       Hyperlipidemia LDL goal <70 Hyperlipidemia:Low fat diet discussed and encouraged.   Lipid Panel  Lab Results  Component Value Date   CHOL 151 01/25/2017   HDL 46 (L) 01/25/2017   LDLCALC 96 08/21/2016   TRIG 90 01/25/2017   CHOLHDL 3.3 01/25/2017   Needs to commit to regular exercise , low HDL    Morbid obesity Unchanged Patient re-educated about  the importance of commitment to a  minimum of 150 minutes of exercise per week.  The importance of healthy food choices with portion control discussed. Encouraged to start a food diary, count calories and to consider  joining a support group. Sample diet sheets offered. Goals set by the patient for the next several months.   Weight /BMI 01/29/2017 11/16/2016 11/09/2016  WEIGHT 308 lb 309 lb 310 lb  HEIGHT 5\' 4"  5\' 4"  5\' 4"   BMI 52.87 kg/m2 53.04 kg/m2 53.21 kg/m2      Rash and nonspecific skin eruption Increased rash and hyperpigmentation on both lower extremities , needs dermatology re evaluation will refer to Dr Nevada Crane again  Encounter for pain management The patient's Controlled Substance registry is reviewed and compliance confirmed. Adequacy of  Pain control and level of function is assessed. Medication dosing is adjusted as deemed appropriate. Twelve weeks of medication is prescribed , patient signs for the script and is provided with a follow up appointment between 11 to 12 weeks .   Asthma Controlled, no change in medication   GERD (gastroesophageal reflux disease) Controlled, no change in medication   Insomnia due to anxiety and fear Sleep hygiene reviewed and written information offered also. Prescription sent for  medication needed.

## 2017-03-07 DIAGNOSIS — G4733 Obstructive sleep apnea (adult) (pediatric): Secondary | ICD-10-CM | POA: Diagnosis not present

## 2017-03-09 DIAGNOSIS — G5602 Carpal tunnel syndrome, left upper limb: Secondary | ICD-10-CM | POA: Diagnosis not present

## 2017-03-09 DIAGNOSIS — M25532 Pain in left wrist: Secondary | ICD-10-CM | POA: Diagnosis not present

## 2017-03-14 ENCOUNTER — Encounter: Payer: Self-pay | Admitting: Gastroenterology

## 2017-04-02 ENCOUNTER — Other Ambulatory Visit: Payer: Self-pay | Admitting: Family Medicine

## 2017-04-02 NOTE — Telephone Encounter (Signed)
Seen 10 1 18

## 2017-04-03 ENCOUNTER — Other Ambulatory Visit: Payer: Self-pay | Admitting: Family Medicine

## 2017-04-03 NOTE — Telephone Encounter (Signed)
Seen 10 1 18

## 2017-04-04 ENCOUNTER — Encounter: Payer: Self-pay | Admitting: Family Medicine

## 2017-04-06 DIAGNOSIS — G4733 Obstructive sleep apnea (adult) (pediatric): Secondary | ICD-10-CM | POA: Diagnosis not present

## 2017-04-17 DIAGNOSIS — G4733 Obstructive sleep apnea (adult) (pediatric): Secondary | ICD-10-CM | POA: Diagnosis not present

## 2017-04-19 ENCOUNTER — Ambulatory Visit (INDEPENDENT_AMBULATORY_CARE_PROVIDER_SITE_OTHER): Payer: Medicare Other | Admitting: Family Medicine

## 2017-04-19 ENCOUNTER — Other Ambulatory Visit: Payer: Self-pay

## 2017-04-19 ENCOUNTER — Encounter: Payer: Self-pay | Admitting: Family Medicine

## 2017-04-19 VITALS — BP 174/90 | HR 64 | Temp 98.2°F | Resp 20 | Ht 64.0 in | Wt 304.1 lb

## 2017-04-19 DIAGNOSIS — R52 Pain, unspecified: Secondary | ICD-10-CM

## 2017-04-19 DIAGNOSIS — I10 Essential (primary) hypertension: Secondary | ICD-10-CM

## 2017-04-19 DIAGNOSIS — E785 Hyperlipidemia, unspecified: Secondary | ICD-10-CM | POA: Diagnosis not present

## 2017-04-19 DIAGNOSIS — E119 Type 2 diabetes mellitus without complications: Secondary | ICD-10-CM | POA: Diagnosis not present

## 2017-04-19 MED ORDER — OXYCODONE HCL ER 40 MG PO T12A: 40.0000 mg | EXTENDED_RELEASE_TABLET | Freq: Two times a day (BID) | ORAL | 0 refills | Status: DC

## 2017-04-19 NOTE — Patient Instructions (Addendum)
F/U in 12 weeks, call if you need me before  CONGRATS on 16  Pound weight loss since July, keep it up! Nurse BP check in 2 weeks in the afternoon , pLEASE take your medication as prescribed the day of the visit  No changes in pain medication  Fasting lipid, cmp and eGFr anfd HBA1C 5 days before next MD visit  Thanks for choosing [facility], we consider it a privelige to serve you.   Please work on good  health habits so that your health will improve. 1. Commitment to daily physical activity for 30 to 60  minutes, if you are able to do this.  2. Commitment to wise food choices. Aim for half of your  food intake to be vegetable and fruit, one quarter starchy foods, and one quarter protein. Try to eat on a regular schedule  3 meals per day, snacking between meals should be limited to vegetables or fruits or small portions of nuts. 64 ounces of water per day is generally recommended, unless you have specific health conditions, like heart failure or kidney failure where you will need to limit fluid intake.  3. Commitment to sufficient and a  good quality of physical and mental rest daily, generally between 6 to 8 hours per day.  WITH PERSISTANCE AND PERSEVERANCE, THE IMPOSSIBLE , BECOMES THE NORM!

## 2017-04-23 ENCOUNTER — Encounter: Payer: Self-pay | Admitting: Family Medicine

## 2017-04-23 NOTE — Progress Notes (Signed)
Angela [name]     MRN: [id number]      DOB: 1960-10-13   HPI Angela [name] is here for follow up and re-evaluation of chronic medical conditions, medication management and review of any available recent lab and radiology data.  Preventive health is updated, specifically  Cancer screening and Immunization.   Questions or concerns regarding consultations or procedures which the PT has had in the interim are  addressed. The PT denies any adverse reactions to current medications since the last visit.  There are no new concerns.  There are no specific complaints   ROS Denies recent fever or chills. Denies sinus pressure, nasal congestion, ear pain or sore throat. Denies chest congestion, productive cough or wheezing. Denies chest pains, palpitations and leg swelling Denies abdominal pain, nausea, vomiting,diarrhea or constipation.   Denies dysuria, frequency, hesitancy or incontinence. Denies uncontrolled  joint pain, swelling and limitation in mobility. Denies headaches, seizures, numbness, or tingling. Denies depression, anxiety or insomnia. Denies skin break down or rash.   PE  BP (!) 174/90 (BP Location: Right Arm, Patient Position: Sitting, Cuff Size: Large)   Pulse 64   Temp 98.2 F (36.8 C) (Temporal)   Resp 20   Ht 5\' 4"  (1.626 m)   Wt (!) 304 lb 1.3 oz (137.9 kg)   SpO2 96%   BMI 52.20 kg/m   Patient alert and oriented and in no cardiopulmonary distress.  HEENT: No facial asymmetry, EOMI,   oropharynx pink and moist.  Neck supple no JVD, no mass.  Chest: Clear to auscultation bilaterally.  CVS: S1, S2 no murmurs, no S3.Regular rate.  ABD: Soft non tender.   Ext: No edema  MS: Decreased  ROM spine, shoulders, hips and knees.  Skin: Intact, no ulcerations or rash noted.  Psych: Good eye contact, normal affect. Memory intact not anxious or depressed appearing.  CNS: CN 2-12 intact, power,  normal throughout.no focal deficits noted.   Assessment &  Plan  Encounter for pain management The patient's Controlled Substance registry is reviewed and compliance confirmed. Adequacy of  Pain control and level of function is assessed. Medication dosing is adjusted as deemed appropriate. Twelve weeks of medication is prescribed , patient signs for the script and is provided with a follow up appointment between 11 to 12 weeks .   Essential hypertension Elevated and uncontrolled at visit, has not taken medication on day of visit. Needs recheck in 2 weeks DASH diet and commitment to daily physical activity for a minimum of 30 minutes discussed and encouraged, as a part of hypertension management. The importance of attaining a healthy weight is also discussed.  BP/Weight 04/19/2017 01/29/2017 11/16/2016 11/09/2016 11/06/2016 10/30/2016 11/17/9468  Systolic BP 962 836 629 476 546 503 546  Diastolic BP 90 92 83 88 82 88 80  Wt. (Lbs) 304.08 308 309 310 320 318.2 318.4  BMI 52.2 52.87 53.04 53.21 56.69 54.62 54.65       Morbid obesity Improving, she is congratulated on this and encouraged to continue. Patient re-educated about  the importance of commitment to a  minimum of 150 minutes of exercise per week.  The importance of healthy food choices with portion control discussed. Encouraged to start a food diary, count calories and to consider  joining a support group. Sample diet sheets offered. Goals set by the patient for the next several months.   Weight /BMI 04/19/2017 01/29/2017 11/16/2016  WEIGHT 304 lb 1.3 oz 308 lb 309 lb  HEIGHT 5\' 4"  5'  4" 5\' 4"   BMI 52.2 kg/m2 52.87 kg/m2 53.04 kg/m2      Diabetes mellitus without complication Angela [name] is reminded of the importance of commitment to daily physical activity for 30 minutes or more, as able and the need to limit carbohydrate intake to 30 to 60 grams per meal to help with blood sugar control.   The need to take medication as prescribed, test blood sugar as directed, and to call between  visits if there is a concern that blood sugar is uncontrolled is also discussed.   Angela [name] is reminded of the importance of daily foot exam, annual eye examination, and good blood sugar, blood pressure and cholesterol control. Controlled, no change in medication Updated lab needed at/ before next visit.   Diabetic Labs Latest Ref Rng & Units 01/25/2017 08/21/2016 04/21/2016 04/19/2016 07/27/2015  HbA1c <5.7 % of total Hgb 6.8(H) 6.8(H) - 6.7(H) 6.6(H)  Microalbumin Not Estab. ug/mL - - 41.2(H) - -  Micro/Creat Ratio 0.0 - 30.0 mg/g creat - - 22.2 - -  Chol <200 mg/dL 151 167 - 144 145  HDL >50 mg/dL 46(L) 50(L) - 46(L) 48  Calc LDL <100 mg/dL - 96 - 82 81  Triglycerides <150 mg/dL 90 107 - 78 80  Creatinine 0.50 - 1.05 mg/dL 0.71 0.69 - 0.76 0.75   BP/Weight 04/19/2017 01/29/2017 11/16/2016 11/09/2016 11/06/2016 10/30/2016 10/21/6331  Systolic BP 545 625 638 937 342 876 811  Diastolic BP 90 92 83 88 82 88 80  Wt. (Lbs) 304.08 308 309 310 320 318.2 318.4  BMI 52.2 52.87 53.04 53.21 56.69 54.62 54.65   Foot/eye exam completion dates Latest Ref Rng & Units 11/06/2016 03/07/2016  Eye Exam No Retinopathy - No Retinopathy  Foot Form Completion - Done -        Hyperlipidemia LDL goal <70 Hyperlipidemia:Low fat diet discussed and encouraged.   Lipid Panel  Lab Results  Component Value Date   CHOL 151 01/25/2017   HDL 46 (L) 01/25/2017   LDLCALC 96 08/21/2016   TRIG 90 01/25/2017   CHOLHDL 3.3 01/25/2017   Updated lab needed at/ before next visit. LDL not at goal

## 2017-04-23 NOTE — Assessment & Plan Note (Signed)
Angela [name] is reminded of the importance of commitment to daily physical activity for 30 minutes or more, as able and the need to limit carbohydrate intake to 30 to 60 grams per meal to help with blood sugar control.   The need to take medication as prescribed, test blood sugar as directed, and to call between visits if there is a concern that blood sugar is uncontrolled is also discussed.   Angela [name] is reminded of the importance of daily foot exam, annual eye examination, and good blood sugar, blood pressure and cholesterol control. Controlled, no change in medication Updated lab needed at/ before next visit.   Diabetic Labs Latest Ref Rng & Units 01/25/2017 08/21/2016 04/21/2016 04/19/2016 07/27/2015  HbA1c <5.7 % of total Hgb 6.8(H) 6.8(H) - 6.7(H) 6.6(H)  Microalbumin Not Estab. ug/mL - - 41.2(H) - -  Micro/Creat Ratio 0.0 - 30.0 mg/g creat - - 22.2 - -  Chol <200 mg/dL 151 167 - 144 145  HDL >50 mg/dL 46(L) 50(L) - 46(L) 48  Calc LDL <100 mg/dL - 96 - 82 81  Triglycerides <150 mg/dL 90 107 - 78 80  Creatinine 0.50 - 1.05 mg/dL 0.71 0.69 - 0.76 0.75   BP/Weight 04/19/2017 01/29/2017 11/16/2016 11/09/2016 11/06/2016 10/30/2016 08/07/8117  Systolic BP 147 829 562 130 865 784 696  Diastolic BP 90 92 83 88 82 88 80  Wt. (Lbs) 304.08 308 309 310 320 318.2 318.4  BMI 52.2 52.87 53.04 53.21 56.69 54.62 54.65   Foot/eye exam completion dates Latest Ref Rng & Units 11/06/2016 03/07/2016  Eye Exam No Retinopathy - No Retinopathy  Foot Form Completion - Done -

## 2017-04-23 NOTE — Assessment & Plan Note (Signed)
Improving, she is congratulated on this and encouraged to continue. Patient re-educated about  the importance of commitment to a  minimum of 150 minutes of exercise per week.  The importance of healthy food choices with portion control discussed. Encouraged to start a food diary, count calories and to consider  joining a support group. Sample diet sheets offered. Goals set by the patient for the next several months.   Weight /BMI 04/19/2017 01/29/2017 11/16/2016  WEIGHT 304 lb 1.3 oz 308 lb 309 lb  HEIGHT 5\' 4"  5\' 4"  5\' 4"   BMI 52.2 kg/m2 52.87 kg/m2 53.04 kg/m2

## 2017-04-23 NOTE — Assessment & Plan Note (Signed)
Elevated and uncontrolled at visit, has not taken medication on day of visit. Needs recheck in 2 weeks DASH diet and commitment to daily physical activity for a minimum of 30 minutes discussed and encouraged, as a part of hypertension management. The importance of attaining a healthy weight is also discussed.  BP/Weight 04/19/2017 01/29/2017 11/16/2016 11/09/2016 11/06/2016 10/30/2016 01/02/148  Systolic BP 969 249 324 199 144 458 483  Diastolic BP 90 92 83 88 82 88 80  Wt. (Lbs) 304.08 308 309 310 320 318.2 318.4  BMI 52.2 52.87 53.04 53.21 56.69 54.62 54.65

## 2017-04-23 NOTE — Assessment & Plan Note (Signed)
Hyperlipidemia:Low fat diet discussed and encouraged.   Lipid Panel  Lab Results  Component Value Date   CHOL 151 01/25/2017   HDL 46 (L) 01/25/2017   LDLCALC 96 08/21/2016   TRIG 90 01/25/2017   CHOLHDL 3.3 01/25/2017   Updated lab needed at/ before next visit. LDL not at goal

## 2017-04-23 NOTE — Assessment & Plan Note (Signed)
The patient's Controlled Substance registry is reviewed and compliance confirmed. Adequacy of  Pain control and level of function is assessed. Medication dosing is adjusted as deemed appropriate. Twelve weeks of medication is prescribed , patient signs for the script and is provided with a follow up appointment between 11 to 12 weeks .  

## 2017-04-26 ENCOUNTER — Telehealth: Payer: Self-pay

## 2017-04-26 ENCOUNTER — Ambulatory Visit: Payer: Self-pay | Admitting: Family Medicine

## 2017-04-26 DIAGNOSIS — E119 Type 2 diabetes mellitus without complications: Secondary | ICD-10-CM

## 2017-04-26 DIAGNOSIS — E785 Hyperlipidemia, unspecified: Secondary | ICD-10-CM

## 2017-04-26 DIAGNOSIS — E559 Vitamin D deficiency, unspecified: Secondary | ICD-10-CM

## 2017-04-26 DIAGNOSIS — I10 Essential (primary) hypertension: Secondary | ICD-10-CM

## 2017-04-26 NOTE — Telephone Encounter (Signed)
-----  Message from Fayrene Helper, MD sent at 04/23/2017  9:37 AM EST ----- Regarding: labs needed for March visit please Pls order for her March visit , and let her know needs 5  bEFORE  days, Fasting CBC, lipid to inc LDL, Cmp and EGFR, hBA1C and microalb and TSH  And vit D thanks

## 2017-05-03 ENCOUNTER — Other Ambulatory Visit: Payer: Self-pay | Admitting: Family Medicine

## 2017-05-03 DIAGNOSIS — M62830 Muscle spasm of back: Secondary | ICD-10-CM

## 2017-05-07 DIAGNOSIS — G4733 Obstructive sleep apnea (adult) (pediatric): Secondary | ICD-10-CM | POA: Diagnosis not present

## 2017-05-08 ENCOUNTER — Other Ambulatory Visit: Payer: Self-pay | Admitting: Family Medicine

## 2017-05-09 ENCOUNTER — Other Ambulatory Visit: Payer: Self-pay | Admitting: Family Medicine

## 2017-05-09 MED ORDER — DIAZEPAM 5 MG PO TABS: ORAL_TABLET | ORAL | 3 refills | Status: DC

## 2017-05-10 DIAGNOSIS — G5602 Carpal tunnel syndrome, left upper limb: Secondary | ICD-10-CM | POA: Diagnosis not present

## 2017-05-11 DIAGNOSIS — G5600 Carpal tunnel syndrome, unspecified upper limb: Secondary | ICD-10-CM | POA: Insufficient documentation

## 2017-05-15 ENCOUNTER — Telehealth: Payer: Self-pay | Admitting: Family Medicine

## 2017-05-15 NOTE — Telephone Encounter (Signed)
Message faxed to her pharmacy to confirm if PA is due and to send over insurance information

## 2017-05-15 NOTE — Telephone Encounter (Signed)
PA complete. Waiting on results. If denied, Xtampza ER 36mg  closest alternative preferred

## 2017-05-15 NOTE — Telephone Encounter (Signed)
Patient just had surgery, she is calling in because she needs prior auth for her oxycodone 20mg . She says DrSimpson is the only one that can prescribe her narcotics. Cb# [phone]

## 2017-05-23 ENCOUNTER — Other Ambulatory Visit: Payer: Self-pay | Admitting: Family Medicine

## 2017-05-24 DIAGNOSIS — R29898 Other symptoms and signs involving the musculoskeletal system: Secondary | ICD-10-CM | POA: Insufficient documentation

## 2017-06-07 DIAGNOSIS — G4733 Obstructive sleep apnea (adult) (pediatric): Secondary | ICD-10-CM | POA: Diagnosis not present

## 2017-06-07 DIAGNOSIS — G5602 Carpal tunnel syndrome, left upper limb: Secondary | ICD-10-CM | POA: Diagnosis not present

## 2017-06-29 DIAGNOSIS — R29898 Other symptoms and signs involving the musculoskeletal system: Secondary | ICD-10-CM | POA: Diagnosis not present

## 2017-07-02 ENCOUNTER — Other Ambulatory Visit: Payer: Self-pay | Admitting: Family Medicine

## 2017-07-03 ENCOUNTER — Other Ambulatory Visit: Payer: Self-pay | Admitting: Family Medicine

## 2017-07-05 ENCOUNTER — Encounter: Payer: Self-pay | Admitting: Family Medicine

## 2017-07-05 ENCOUNTER — Ambulatory Visit (INDEPENDENT_AMBULATORY_CARE_PROVIDER_SITE_OTHER): Payer: Medicare Other | Admitting: Family Medicine

## 2017-07-05 VITALS — BP 160/84 | HR 81 | Resp 16 | Ht 64.0 in | Wt 321.0 lb

## 2017-07-05 DIAGNOSIS — E785 Hyperlipidemia, unspecified: Secondary | ICD-10-CM

## 2017-07-05 DIAGNOSIS — I10 Essential (primary) hypertension: Secondary | ICD-10-CM

## 2017-07-05 DIAGNOSIS — E119 Type 2 diabetes mellitus without complications: Secondary | ICD-10-CM | POA: Diagnosis not present

## 2017-07-05 DIAGNOSIS — E669 Obesity, unspecified: Secondary | ICD-10-CM

## 2017-07-05 DIAGNOSIS — R52 Pain, unspecified: Secondary | ICD-10-CM

## 2017-07-05 DIAGNOSIS — G4733 Obstructive sleep apnea (adult) (pediatric): Secondary | ICD-10-CM | POA: Diagnosis not present

## 2017-07-05 DIAGNOSIS — Z1211 Encounter for screening for malignant neoplasm of colon: Secondary | ICD-10-CM | POA: Diagnosis not present

## 2017-07-05 DIAGNOSIS — E1169 Type 2 diabetes mellitus with other specified complication: Secondary | ICD-10-CM | POA: Diagnosis not present

## 2017-07-05 DIAGNOSIS — K625 Hemorrhage of anus and rectum: Secondary | ICD-10-CM

## 2017-07-05 MED ORDER — OXYCODONE HCL ER 40 MG PO T12A: 40.0000 mg | EXTENDED_RELEASE_TABLET | Freq: Two times a day (BID) | ORAL | 0 refills | Status: DC

## 2017-07-05 NOTE — Patient Instructions (Addendum)
F/u week of April 29, cal iof you need me sooner  New additional medication sent for blood pressure , spironolactone 25 mg one daily  Continue same pain medication  You are referred to Dr Katy Fitch for eye exam,  You are referred to [facility] for colonoscopy and for rectal bleeding  Microalb from office today   Fasting lipid, cmp and egFR, hBA1C , tSH and Vit D April 24 or shortly after

## 2017-07-05 NOTE — Progress Notes (Signed)
Fill Date ID Written Drug Qty Days Prescriber Rx # Pharmacy Refill Daily Dose * Pymt Type PMP  06/13/2017  1  05/09/2017  Diazepam 5 Mg Tablet  90 30 Ma Sim  [id number] Nor ([id number])  1 1.50 LME Medicare  [place]  06/13/2017  1  04/19/2017  Oxycontin Er 40 Mg Tablet  60 30 Ma Sim  [id number] Nor ([id number])  0 120.00 MME Medicare  [place]  06/07/2017  1  01/29/2017  Zolpidem Tartrate 10 Mg Tablet  30 30 Ma Sim  [id number] Nor ([id number])  2 0.50 LME Medicare  [place]  05/16/2017  1  04/19/2017  Oxycontin Er 40 Mg Tablet  60 30 Ma Sim  [id number] Nor ([id number])  0 120.00 MME Medicare  [place]  05/15/2017  1  05/09/2017  Diazepam 5 Mg Tablet  90 30 Ma Sim  [id number] Nor ([id number])  0 1.50 LME Medicare  [place]  05/08/2017  1  01/29/2017  Zolpidem Tartrate 10 Mg Tablet  30 30 Ma Sim  [id number] Nor ([id number])  1 0.50 LME Medicare  [place]  04/14/2017  1  01/29/2017  Diazepam 5 Mg Tablet  90 30 Ma Sim  [id number] Nor ([id number])  0 1.50 LME Medicare  [place]  04/14/2017  1  01/29/2017  Oxycontin Er 40 Mg Tablet  60 30 Ma Sim  [id number] Nor ([id number])  0 120.00 MME Medicare  [place]  04/07/2017  1  01/29/2017  Zolpidem Tartrate 10 Mg Tablet  30 30 Ma Sim  [id number] Nor ([id number])  0 0.50 LME Medicare  [place]  03/15/2017  1  01/29/2017  Oxycontin Er 40 Mg Tablet  60 30 Ma Sim  [id number] Nor ([id number])  0 120.00 MME Medicare  [place]  03/11/2017  1  01/29/2017  Diazepam 5 Mg Tablet  90 30 Ma Sim  [id number] Nor ([id number])  0 1.50 LME Medicare  [place]  03/08/2017  1  01/29/2017  Zolpidem Tartrate 10 Mg Tablet  30 30 Ma Sim  [id number] Nor ([id number])  0 0.50 LME Medicare  [place]  02/14/2017  1  01/29/2017  Oxycontin Er 40 Mg Tablet  60 30 Ma Sim  [id number] Nor ([id number])  0 120.00 MME Medicare  [place]  02/10/2017  1  01/29/2017  Diazepam 5 Mg Tablet  90 30 Ma Sim  [id number] Nor ([id number])  0 1.50 LME Medicare  [place]  02/07/2017  1  01/29/2017  Zolpidem Tartrate 10 Mg Tablet             Reviewed at visit

## 2017-07-06 ENCOUNTER — Other Ambulatory Visit (HOSPITAL_COMMUNITY)
Admission: RE | Admit: 2017-07-06 | Discharge: 2017-07-06 | Disposition: A | Discharge status: Home / Self Care | Payer: Medicare Other | Source: Other Acute Inpatient Hospital | Attending: Family Medicine | Admitting: Family Medicine

## 2017-07-06 DIAGNOSIS — E785 Hyperlipidemia, unspecified: Secondary | ICD-10-CM | POA: Insufficient documentation

## 2017-07-06 DIAGNOSIS — E1169 Type 2 diabetes mellitus with other specified complication: Secondary | ICD-10-CM | POA: Diagnosis not present

## 2017-07-06 DIAGNOSIS — K625 Hemorrhage of anus and rectum: Secondary | ICD-10-CM | POA: Diagnosis not present

## 2017-07-06 DIAGNOSIS — E119 Type 2 diabetes mellitus without complications: Secondary | ICD-10-CM | POA: Diagnosis present

## 2017-07-06 DIAGNOSIS — E669 Obesity, unspecified: Secondary | ICD-10-CM | POA: Insufficient documentation

## 2017-07-07 LAB — MICROALBUMIN / CREATININE URINE RATIO
Creatinine, Urine: 31.4 mg/dL
MICROALB/CREAT RATIO: 107.3 mg/g{creat} — AB (ref 0.0–30.0)
Microalb, Ur: 33.7 ug/mL — ABNORMAL HIGH

## 2017-07-11 ENCOUNTER — Other Ambulatory Visit: Payer: Self-pay | Admitting: Family Medicine

## 2017-07-11 ENCOUNTER — Telehealth: Payer: Self-pay | Admitting: Family Medicine

## 2017-07-11 NOTE — Telephone Encounter (Signed)
Patient called in to let Dr.Simpson know her urine was cloudy. She states no burn, no itch, and no sexual activity. She is requesting prescription of 2 pills that will clear this up. After speaking with brandi I advised her to come in for a nurse visit tomorrow morning.

## 2017-07-12 ENCOUNTER — Ambulatory Visit (INDEPENDENT_AMBULATORY_CARE_PROVIDER_SITE_OTHER): Payer: Medicare Other

## 2017-07-12 DIAGNOSIS — R300 Dysuria: Secondary | ICD-10-CM

## 2017-07-12 LAB — POCT URINALYSIS DIPSTICK
Bilirubin, UA: NEGATIVE
GLUCOSE UA: NEGATIVE
Ketones, UA: NEGATIVE
NITRITE UA: POSITIVE
PROTEIN UA: NEGATIVE
SPEC GRAV UA: 1.025 (ref 1.010–1.025)
Urobilinogen, UA: 1 E.U./dL
pH, UA: 6.5 (ref 5.0–8.0)

## 2017-07-12 MED ORDER — CIPROFLOXACIN HCL 500 MG PO TABS: 500.0000 mg | ORAL_TABLET | Freq: Two times a day (BID) | ORAL | 0 refills | Status: DC

## 2017-07-13 ENCOUNTER — Other Ambulatory Visit (HOSPITAL_COMMUNITY)
Admission: RE | Admit: 2017-07-13 | Discharge: 2017-07-13 | Disposition: A | Discharge status: Home / Self Care | Payer: Medicare Other | Source: Other Acute Inpatient Hospital | Attending: Family Medicine | Admitting: Family Medicine

## 2017-07-13 DIAGNOSIS — R300 Dysuria: Secondary | ICD-10-CM | POA: Insufficient documentation

## 2017-07-15 ENCOUNTER — Other Ambulatory Visit: Payer: Self-pay | Admitting: Family Medicine

## 2017-07-15 NOTE — Assessment & Plan Note (Signed)
Hyperlipidemia:Low fat diet discussed and encouraged.   Lipid Panel  Lab Results  Component Value Date   CHOL 151 01/25/2017   HDL 46 (L) 01/25/2017   LDLCALC 87 01/25/2017   TRIG 90 01/25/2017   CHOLHDL 3.3 01/25/2017   Updated lab needed at/ before next visit.

## 2017-07-15 NOTE — Assessment & Plan Note (Signed)
Deteriorated. Patient re-educated about  the importance of commitment to a  minimum of 150 minutes of exercise per week.  The importance of healthy food choices with portion control discussed. Encouraged to start a food diary, count calories and to consider  joining a support group. Sample diet sheets offered. Goals set by the patient for the next several months.   Weight /BMI 07/05/2017 04/19/2017 01/29/2017  WEIGHT 321 lb 304 lb 1.3 oz 308 lb  HEIGHT 5\' 4"  5\' 4"  5\' 4"   BMI 55.1 kg/m2 52.2 kg/m2 52.87 kg/m2  fluid retention also present

## 2017-07-15 NOTE — Progress Notes (Signed)
Angela [name]     MRN: [id number]      DOB: 07-27-1960   HPI Angela [name] is here for follow up and re-evaluation of chronic medical conditions, medication management in particular pain management and review of any available recent lab and radiology data.  Preventive health is updated, specifically  Cancer screening and Immunization.   Still needs colonoscopy , has intermittent rectal bleeding, needs appt at [facility] from record review and patient's reporting , this was recommended since 2018 by her local GI Doc Angela [name] has had successful carpal tunnel surgery since last visit The PT denies any adverse reactions to current medications since the last visit.     ROS Denies recent fever or chills. Denies sinus pressure, nasal congestion, ear pain or sore throat. Denies chest congestion, productive cough or wheezing. Denies chest pains, palpitations and leg swelling C/o intermittent rectal bleeding, colonoscopy still outstanding, was referred to [facility] in 2018 by local GI Doc but did not follow through, requests referral   Denies dysuria, frequency, hesitancy or incontinence. Chronic  Back pain  and limitation in mobility. Denies headaches, seizures, numbness, or tingling. Denies depression,increased  Anxiety because of her daughter, Angela [name] denies  insomnia. Denies skin break down or rash.   PE  BP (!) 160/84   Pulse 81   Resp 16   Ht 5\' 4"  (1.626 m)   Wt (!) 321 lb (145.6 kg)   SpO2 96%   BMI 55.10 kg/m   Patient alert and oriented and in no cardiopulmonary distress.  HEENT: No facial asymmetry, EOMI,   oropharynx pink and moist.  Neck supple no JVD, no mass.  Chest: Clear to auscultation bilaterally.  CVS: S1, S2 no murmurs, no S3.Regular rate.  ABD: Soft non tender.   Ext: No edema  MS: Decreased ROM spine, adequate in houlders, hips and knees.  Skin: Intact, no ulcerations or rash noted.  Psych: Good eye contact, normal affect. Memory intact not anxious or depressed  appearing.  CNS: CN 2-12 intact, power,  normal throughout.no focal deficits noted.   Assessment & Plan  Essential hypertension Uncontrolled, spironolactone added  DASH diet and commitment to daily physical activity for a minimum of 30 minutes discussed and encouraged, as a part of hypertension management. The importance of attaining a healthy weight is also discussed.  BP/Weight 07/05/2017 04/19/2017 01/29/2017 11/16/2016 11/09/2016 0/06/5595 07/31/6382  Systolic BP 536 468 032 122 482 500 370  Diastolic BP 84 90 92 83 88 82 88  Wt. (Lbs) 321 304.08 308 309 310 320 318.2  BMI 55.1 52.2 52.87 53.04 53.21 56.69 54.62       Hyperlipidemia LDL goal <70 Hyperlipidemia:Low fat diet discussed and encouraged.   Lipid Panel  Lab Results  Component Value Date   CHOL 151 01/25/2017   HDL 46 (L) 01/25/2017   LDLCALC 87 01/25/2017   TRIG 90 01/25/2017   CHOLHDL 3.3 01/25/2017   Updated lab needed at/ before next visit.     Morbid obesity Deteriorated. Patient re-educated about  the importance of commitment to a  minimum of 150 minutes of exercise per week.  The importance of healthy food choices with portion control discussed. Encouraged to start a food diary, count calories and to consider  joining a support group. Sample diet sheets offered. Goals set by the patient for the next several months.   Weight /BMI 07/05/2017 04/19/2017 01/29/2017  WEIGHT 321 lb 304 lb 1.3 oz 308 lb  HEIGHT 5\' 4"  5\' 4"  5\' 4"   BMI 55.1 kg/m2 52.2 kg/m2 52.87 kg/m2  fluid retention also present    Diabetes mellitus without complication Angela [name] is reminded of the importance of commitment to daily physical activity for 30 minutes or more, as able and the need to limit carbohydrate intake to 30 to 60 grams per meal to help with blood sugar control.   The need to take medication as prescribed, test blood sugar as directed, and to call between visits if there is a concern that blood sugar is uncontrolled is  also discussed.   Angela [name] is reminded of the importance of daily foot exam, annual eye examination, and good blood sugar, blood pressure and cholesterol control. Updated lab needed at/ before next visit.  Diabetic Labs Latest Ref Rng & Units 07/06/2017 01/25/2017 08/21/2016 04/21/2016 04/19/2016  HbA1c <5.7 % of total Hgb - 6.8(H) 6.8(H) - 6.7(H)  Microalbumin Not Estab. ug/mL 33.7(H) - - 41.2(H) -  Micro/Creat Ratio 0.0 - 30.0 mg/g creat 107.3(H) - - 22.2 -  Chol <200 mg/dL - 151 167 - 144  HDL >50 mg/dL - 46(L) 50(L) - 46(L)  Calc LDL mg/dL (calc) - 87 96 - 82  Triglycerides <150 mg/dL - 90 107 - 78  Creatinine 0.50 - 1.05 mg/dL - 0.71 0.69 - 0.76   BP/Weight 07/05/2017 04/19/2017 01/29/2017 11/16/2016 11/09/2016 07/06/486 12/07/1692  Systolic BP 503 888 280 034 917 915 056  Diastolic BP 84 90 92 83 88 82 88  Wt. (Lbs) 321 304.08 308 309 310 320 318.2  BMI 55.1 52.2 52.87 53.04 53.21 56.69 54.62   Foot/eye exam completion dates Latest Ref Rng & Units 11/06/2016 03/07/2016  Eye Exam No Retinopathy - No Retinopathy  Foot Form Completion - Done -        Encounter for pain management The patient's Controlled Substance registry is reviewed and compliance confirmed. Adequacy of  Pain control and level of function is assessed. Medication dosing is adjusted as deemed appropriate. Twelve weeks of medication is prescribed , patient signs for the script and is provided with a follow up appointment between 11 to 12 weeks .

## 2017-07-15 NOTE — Assessment & Plan Note (Signed)
Angela [name] is reminded of the importance of commitment to daily physical activity for 30 minutes or more, as able and the need to limit carbohydrate intake to 30 to 60 grams per meal to help with blood sugar control.   The need to take medication as prescribed, test blood sugar as directed, and to call between visits if there is a concern that blood sugar is uncontrolled is also discussed.   Angela [name] is reminded of the importance of daily foot exam, annual eye examination, and good blood sugar, blood pressure and cholesterol control. Updated lab needed at/ before next visit.  Diabetic Labs Latest Ref Rng & Units 07/06/2017 01/25/2017 08/21/2016 04/21/2016 04/19/2016  HbA1c <5.7 % of total Hgb - 6.8(H) 6.8(H) - 6.7(H)  Microalbumin Not Estab. ug/mL 33.7(H) - - 41.2(H) -  Micro/Creat Ratio 0.0 - 30.0 mg/g creat 107.3(H) - - 22.2 -  Chol <200 mg/dL - 151 167 - 144  HDL >50 mg/dL - 46(L) 50(L) - 46(L)  Calc LDL mg/dL (calc) - 87 96 - 82  Triglycerides <150 mg/dL - 90 107 - 78  Creatinine 0.50 - 1.05 mg/dL - 0.71 0.69 - 0.76   BP/Weight 07/05/2017 04/19/2017 01/29/2017 11/16/2016 11/09/2016 05/01/8865 10/31/7364  Systolic BP 815 947 076 151 834 373 578  Diastolic BP 84 90 92 83 88 82 88  Wt. (Lbs) 321 304.08 308 309 310 320 318.2  BMI 55.1 52.2 52.87 53.04 53.21 56.69 54.62   Foot/eye exam completion dates Latest Ref Rng & Units 11/06/2016 03/07/2016  Eye Exam No Retinopathy - No Retinopathy  Foot Form Completion - Done -

## 2017-07-15 NOTE — Assessment & Plan Note (Signed)
The patient's Controlled Substance registry is reviewed and compliance confirmed. Adequacy of  Pain control and level of function is assessed. Medication dosing is adjusted as deemed appropriate. Twelve weeks of medication is prescribed , patient signs for the script and is provided with a follow up appointment between 11 to 12 weeks .  

## 2017-07-15 NOTE — Assessment & Plan Note (Signed)
Uncontrolled, spironolactone added  DASH diet and commitment to daily physical activity for a minimum of 30 minutes discussed and encouraged, as a part of hypertension management. The importance of attaining a healthy weight is also discussed.  BP/Weight 07/05/2017 04/19/2017 01/29/2017 11/16/2016 11/09/2016 01/02/1120 10/01/4467  Systolic BP 507 225 750 518 335 825 189  Diastolic BP 84 90 92 83 88 82 88  Wt. (Lbs) 321 304.08 308 309 310 320 318.2  BMI 55.1 52.2 52.87 53.04 53.21 56.69 54.62

## 2017-07-16 LAB — URINE CULTURE

## 2017-07-19 ENCOUNTER — Telehealth: Payer: Self-pay | Admitting: Family Medicine

## 2017-07-19 NOTE — Telephone Encounter (Signed)
Called [facility] to check the status of a referral to GI. They are requesting an order to be added for a colonoscopy screening.

## 2017-07-19 NOTE — Telephone Encounter (Signed)
Already entered

## 2017-07-24 ENCOUNTER — Other Ambulatory Visit: Payer: Self-pay

## 2017-07-24 ENCOUNTER — Encounter (HOSPITAL_COMMUNITY): Payer: Self-pay | Admitting: Emergency Medicine

## 2017-07-24 ENCOUNTER — Emergency Department (HOSPITAL_COMMUNITY): Payer: Medicare Other

## 2017-07-24 ENCOUNTER — Emergency Department (HOSPITAL_COMMUNITY)
Admission: EM | Admit: 2017-07-24 | Discharge: 2017-07-24 | Disposition: A | Discharge status: Home / Self Care | Payer: Medicare Other | Attending: Emergency Medicine | Admitting: Emergency Medicine

## 2017-07-24 DIAGNOSIS — Z79899 Other long term (current) drug therapy: Secondary | ICD-10-CM | POA: Diagnosis not present

## 2017-07-24 DIAGNOSIS — Z87442 Personal history of urinary calculi: Secondary | ICD-10-CM | POA: Insufficient documentation

## 2017-07-24 DIAGNOSIS — Z7984 Long term (current) use of oral hypoglycemic drugs: Secondary | ICD-10-CM | POA: Insufficient documentation

## 2017-07-24 DIAGNOSIS — R109 Unspecified abdominal pain: Secondary | ICD-10-CM | POA: Diagnosis not present

## 2017-07-24 DIAGNOSIS — E1140 Type 2 diabetes mellitus with diabetic neuropathy, unspecified: Secondary | ICD-10-CM | POA: Diagnosis not present

## 2017-07-24 DIAGNOSIS — I10 Essential (primary) hypertension: Secondary | ICD-10-CM | POA: Insufficient documentation

## 2017-07-24 DIAGNOSIS — I2510 Atherosclerotic heart disease of native coronary artery without angina pectoris: Secondary | ICD-10-CM | POA: Insufficient documentation

## 2017-07-24 DIAGNOSIS — R300 Dysuria: Secondary | ICD-10-CM | POA: Diagnosis not present

## 2017-07-24 DIAGNOSIS — M549 Dorsalgia, unspecified: Secondary | ICD-10-CM | POA: Diagnosis not present

## 2017-07-24 LAB — BASIC METABOLIC PANEL
Anion gap: 10 (ref 5–15)
BUN: 18 mg/dL (ref 6–20)
CHLORIDE: 102 mmol/L (ref 101–111)
CO2: 27 mmol/L (ref 22–32)
CREATININE: 0.94 mg/dL (ref 0.44–1.00)
Calcium: 9.8 mg/dL (ref 8.9–10.3)
Glucose, Bld: 133 mg/dL — ABNORMAL HIGH (ref 65–99)
POTASSIUM: 3.7 mmol/L (ref 3.5–5.1)
SODIUM: 139 mmol/L (ref 135–145)

## 2017-07-24 LAB — CBC WITH DIFFERENTIAL/PLATELET
BASOS ABS: 0 10*3/uL (ref 0.0–0.1)
Basophils Relative: 0 %
EOS ABS: 0.1 10*3/uL (ref 0.0–0.7)
EOS PCT: 2 %
HCT: 42.8 % (ref 36.0–46.0)
HEMOGLOBIN: 13.1 g/dL (ref 12.0–15.0)
LYMPHS PCT: 39 %
Lymphs Abs: 2.3 10*3/uL (ref 0.7–4.0)
MCH: 25.2 pg — ABNORMAL LOW (ref 26.0–34.0)
MCHC: 30.6 g/dL (ref 30.0–36.0)
MCV: 82.5 fL (ref 78.0–100.0)
Monocytes Absolute: 0.4 10*3/uL (ref 0.1–1.0)
Monocytes Relative: 7 %
NEUTROS PCT: 52 %
Neutro Abs: 3.1 10*3/uL (ref 1.7–7.7)
PLATELETS: 451 10*3/uL — AB (ref 150–400)
RBC: 5.19 MIL/uL — AB (ref 3.87–5.11)
RDW: 14.6 % (ref 11.5–15.5)
WBC: 5.9 10*3/uL (ref 4.0–10.5)

## 2017-07-24 LAB — URINALYSIS, ROUTINE W REFLEX MICROSCOPIC
Bilirubin Urine: NEGATIVE
Glucose, UA: NEGATIVE mg/dL
Hgb urine dipstick: NEGATIVE
Ketones, ur: NEGATIVE mg/dL
Leukocytes, UA: NEGATIVE
NITRITE: NEGATIVE
PROTEIN: NEGATIVE mg/dL
SPECIFIC GRAVITY, URINE: 1.023 (ref 1.005–1.030)
pH: 5 (ref 5.0–8.0)

## 2017-07-24 MED ORDER — HYDROMORPHONE HCL 1 MG/ML IJ SOLN
1.0000 mg | Freq: Once | INTRAMUSCULAR | Status: AC
  Administered 2017-07-24: 1 mg via INTRAVENOUS
  Filled 2017-07-24: qty 1

## 2017-07-24 MED ORDER — ONDANSETRON HCL 4 MG/2ML IJ SOLN
4.0000 mg | Freq: Once | INTRAMUSCULAR | Status: AC
  Administered 2017-07-24: 4 mg via INTRAVENOUS
  Filled 2017-07-24: qty 2

## 2017-07-24 MED ORDER — SODIUM CHLORIDE 0.9 % IV SOLN: INTRAVENOUS | Status: DC

## 2017-07-24 MED ORDER — SODIUM CHLORIDE 0.9 % IV BOLUS
500.0000 mL | Freq: Once | INTRAVENOUS | Status: AC
Start: 2017-07-24 — End: 2017-07-24
  Administered 2017-07-24: 500 mL via INTRAVENOUS

## 2017-07-24 NOTE — ED Provider Notes (Signed)
[facility] EMERGENCY DEPARTMENT Provider Note   CSN: [id number] Arrival date & time: 07/24/17  0707     History   Chief Complaint Chief Complaint  Patient presents with  . Flank Pain    HPI Angela [name] is a 57 y.o. female.  Patient with complaint of left flank pain states the urine is cloudy for and symptoms been ongoing for the past 3 days.  No nausea vomiting or diarrhea no fevers.  Patient recent completed antibiotic diagnosis urinary tract infection last week.  Patient's primary care doctor is Dr. Moshe Cipro.  No fall or injury.  Patient thinks the symptoms are related to a kidney stone she has had problems with them in the past.  Pain is made worse by movement of the left leg.     Past Medical History:  Diagnosis Date  . Anxiety   . Arthritis   . Asthma   . Coronary atherosclerosis of native coronary artery    Mild nonobstructive CAD by cardiac catheterization 2008 - Dr. Terrence Dupont  . Degenerative disc disease   . Depression   . Dysrhythmia   . Essential hypertension, benign   . Folliculitis 10/12/4313  . GERD (gastroesophageal reflux disease)   . H/O hiatal hernia   . Helicobacter pylori gastritis 2008  . Hypercholesterolemia   . Hypertension   . Hyperthyroidism    s/p radiation  . Low back pain   . Migraine   . Nephrolithiasis    Recurring episodes since 2004  . Severe obesity (BMI >= 40) (HCC)   . Sleep apnea    STOP BANG SCORE 6no cpap used  . Type 2 diabetes mellitus with diabetic neuropathy [facility])     Patient Active Problem List   Diagnosis Date Noted  . Insomnia due to anxiety and fear 02/11/2017  . Encounter for pain management 11/09/2016  . Chronic wrist pain, left 11/06/2016  . Chronic left hip pain 11/06/2016  . Mass of right ovary 09/04/2016  . FH: colon cancer 09/01/2016  . Numbness of left hand 08/21/2016  . Allergic rhinitis 12/05/2015  . Upper airway cough syndrome 09/19/2015  . Back muscle spasm 09/14/2015  . Rash and nonspecific skin  eruption 09/14/2015  . Knee pain, right 03/29/2015  . Cervical neck pain with evidence of disc disease 04/13/2014  . Coronary atherosclerosis of native coronary artery 05/26/2013  . Hemorrhoid 04/08/2012  . Vitamin D deficiency 04/04/2012  . Nephrolithiasis 12/10/2011  . Sleep apnea syndrome 12/05/2011  . GERD (gastroesophageal reflux disease) 08/16/2011  . Back pain 11/03/2010  . Hyperlipidemia LDL goal <70 08/11/2008  . Morbid obesity ([place]) 01/06/2008  . Diabetes mellitus without complication ([place]) 40/11/6759  . Depression with anxiety 05/05/2006  . Essential hypertension 05/05/2006  . Asthma 05/05/2006  . Helicobacter pylori gastritis 05/01/2006    Past Surgical History:  Procedure Laterality Date  . ABDOMINAL HYSTERECTOMY    . BACK SURGERY  1993  . BACK SURGERY  1994  . BACK SURGERY  2002  . BACK SURGERY  2011   [place] Left   . CHOLECYSTECTOMY  1996  . COLONOSCOPY  2000 [facility]   INT HEMORRHOIDS/FISSURE  . COLONOSCOPY  2003 [facility], CHANGE IN BOWEL HABITS   INT HEMORRHOIDS  . COLONOSCOPY  2006 [facility]   INT HEMORRHOIDS  . COLONOSCOPY  2007 [facility]   INT HEMORRHOIDS  . CYSTOSCOPY WITH STENT PLACEMENT Left 07/25/2012   Procedure: CYSTOSCOPY, left retograde pyelogram WITH left ureteral  STENT PLACEMENT;  Surgeon: Claybon Jabs, MD;  Location: [facility];  Service: Urology;  Laterality: Left;  . CYSTOSCOPY/RETROGRADE/URETEROSCOPY  12/04/2011   Procedure: CYSTOSCOPY/RETROGRADE/URETEROSCOPY;  Surgeon: Molli Hazard, MD;  Location: [facility];  Service: Urology;  Laterality: Right;  . CYSTOSCOPY/RETROGRADE/URETEROSCOPY/STONE EXTRACTION WITH BASKET Left 09/03/2012   Procedure: CYSTOSCOPY/RETROGRADE pyelogram/digital URETEROSCOPY/STONE EXTRACTION WITH BASKET, left stent removal;  Surgeon: Molli Hazard, MD;  Location: [facility];  Service: Urology;  Laterality: Left;  . ESOPHAGOGASTRODUODENOSCOPY  2008   [facility]:CWCBJ hiatal hernia./Normal esophagus without  evidence of Barrett's mass, stricture, erosion or ulceration./Normal duodenal bulb and second portion of the duodenum./Diffuse erythema in the body and the antrum with occasional erosion.  Biopsies obtained via cold forceps to evaluate for H. pylori gastritis  . FRACTURE SURGERY  1999   right clavicle  . HOLMIUM LASER APPLICATION Left 09/30/8313   Procedure: HOLMIUM LASER APPLICATION;  Surgeon: Molli Hazard, MD;  Location: [facility];  Service: Urology;  Laterality: Left;  . KNEE ARTHROSCOPY  10/04/2010,    right knee arthroscopy, dr Theda Sers  . left knee arthroscopic surgery  1999  . Left salphingectomy secondary to ectopic pregnancy  1991  . PARTIAL HYSTERECTOMY  1991  . rotary cuff  Right 05/14/2014   [facility]  . UPPER GASTROINTESTINAL ENDOSCOPY  2008 abd pain   H. pylori gastritis  . UPPER GASTROINTESTINAL ENDOSCOPY  1996 [facility]   PUD     OB History    Gravida  3   Para  2   Term  1   Preterm  1   AB  1   Living  1     SAB      TAB      Ectopic  1   Multiple      Live Births  2            Home Medications    Prior to Admission medications   Medication Sig Start Date End Date Taking? Authorizing Provider  amLODipine (NORVASC) 10 MG tablet Take 1 tablet (10 mg total) by mouth daily. 09/13/12 10/02/17 Yes Fayrene Helper, MD  atorvastatin (LIPITOR) 80 MG tablet TAKE 1 TABLET BY MOUTH EVERY DAY 01/10/17  Yes Fayrene Helper, MD  azelastine (ASTELIN) 0.1 % nasal spray Place 2 sprays into both nostrils 2 (two) times daily. Use in each nostril as directed 11/06/16  Yes Fayrene Helper, MD  ciprofloxacin (CIPRO) 500 MG tablet Take 1 tablet (500 mg total) by mouth 2 (two) times daily. 07/12/17  Yes Fayrene Helper, MD  cloNIDine (CATAPRES - DOSED IN MG/24 HR) 0.3 mg/24hr patch Place 1 patch (0.3 mg total) onto the skin once a week. 01/29/17  Yes Fayrene Helper, MD  cloNIDine (CATAPRES) 0.3 MG tablet TAKE ONE TABLET AT 9 PM EVERY NIGHT FOR HIGH  BLOOD PRESSURE 09/04/16  Yes Fayrene Helper, MD  cyclobenzaprine (FLEXERIL) 10 MG tablet TAKE 1 TABLET BY MOUTH 3 TIMES A DAY 05/03/17  Yes Fayrene Helper, MD  diazepam (VALIUM) 5 MG tablet One tablet by mouth  three times per day 05/09/17  Yes Fayrene Helper, MD  DULoxetine (CYMBALTA) 60 MG capsule TAKE 1 CAPSULE BY MOUTH TWICE A DAY 07/02/17  Yes Fayrene Helper, MD  ergocalciferol (VITAMIN D2) 50000 units capsule Take 1 capsule (50,000 Units total) by mouth once a week. One capsule once weekly 08/22/16  Yes Fayrene Helper, MD  esomeprazole (NEXIUM) 40 MG capsule TAKE 1 CAPSULE (40 MG TOTAL) BY  MOUTH DAILY. 07/16/17  Yes Fayrene Helper, MD  fluticasone (FLONASE) 50 MCG/ACT nasal spray Place 2 sprays into both nostrils daily. Patient taking differently: Place 2 sprays into both nostrils as needed.  07/08/15  Yes Fayrene Helper, MD  furosemide (LASIX) 40 MG tablet TAKE 1 TABLET BY MOUTH TWICE A DAY 03/01/15  Yes Fayrene Helper, MD  gabapentin (NEURONTIN) 800 MG tablet TAKE 1 TABLET BY MOUTH 4 TIMES A DAY 12/04/16  Yes Fayrene Helper, MD  ipratropium-albuterol (DUONEB) 0.5-2.5 (3) MG/3ML SOLN Take 3 mLs by nebulization every 6 (six) hours as needed. 07/27/15  Yes Fayrene Helper, MD  KLOR-CON M20 20 MEQ tablet TAKE 1 TABLET (20 MEQ TOTAL) BY MOUTH 2 (TWO) TIMES DAILY. 10/02/16  Yes Fayrene Helper, MD  loratadine (CLARITIN) 10 MG tablet Take 1 tablet (10 mg total) by mouth daily. 11/06/16  Yes Fayrene Helper, MD  metFORMIN (GLUCOPHAGE) 1000 MG tablet TAKE 1 TABLET BY MOUTH TWICE A DAY WITH FOOD 07/02/17  Yes Fayrene Helper, MD  montelukast (SINGULAIR) 10 MG tablet TAKE 1 TABLET (10 MG TOTAL) BY MOUTH AT BEDTIME. 04/05/16  Yes Fayrene Helper, MD  oxyCODONE (OXYCONTIN) 40 mg 12 hr tablet Take 1 tablet (40 mg total) by mouth every 12 (twelve) hours. 07/05/17  Yes Fayrene Helper, MD  PROAIR HFA 108 [phone] BASE) MCG/ACT inhaler INHALE 2 PUFFS BY MOUTH EVERY  6-8HRS AS NEEDED 10/12/14  Yes Fayrene Helper, MD  SYMBICORT 160-4.5 MCG/ACT inhaler INHALE 2 PUUFS BY MOUTH TWICE DAILY 06/06/16  Yes Fayrene Helper, MD  tiZANidine (ZANAFLEX) 2 MG tablet 1 tablet twice a day as needed for pain   Yes [provider]  triamcinolone cream (KENALOG) 0.1 % Apply 1 application topically 2 (two) times daily. 02/26/15  Yes Tanna Furry, MD  triamterene-hydrochlorothiazide (MAXZIDE) 75-50 MG tablet TAKE 1 TABLET BY MOUTH DAILY. 07/03/17  Yes Fayrene Helper, MD  zolpidem (AMBIEN) 10 MG tablet Take 1 tablet (10 mg total) by mouth at bedtime. 01/29/17  Yes Fayrene Helper, MD  fluconazole (DIFLUCAN) 150 MG tablet ONE TABLET ONCE DAILY FOR 2 DAYS 05/23/17   Raylene Everts, MD  hydrocortisone (ANUSOL-HC) 2.5 % rectal cream Place 1 application rectally 4 (four) times daily. FOR 12 DAYS 09/26/16   Fields, Marga Melnick, MD  NITROSTAT 0.4 MG SL tablet PLACE 1 TABLET UNDER TONGUE IF NEEDED FOR CHEST PAIN.Bennie Pierini OF 3 TIMES 09/30/14   [provider]  losartan-hydrochlorothiazide (HYZAAR) 50-12.5 MG per tablet TAKE 1 TABLET BY MOUTH ONCE DAILY *REPLACES DIOVAN/HCTZ* 04/20/11 07/11/11  Fayrene Helper, MD    Family History Family History  Problem Relation Age of Onset  . Heart disease Mother   . Hypertension Mother   . Cancer Mother        Cervical   . Asthma Mother   . Heart attack Father   . Cancer Father        Prostate  . Colon cancer Father        DECEASED AGE 22  . Colon polyps Neg Hx     Social History Social History   Tobacco Use  . Smoking status: Never Smoker  . Smokeless tobacco: Never Used  Substance Use Topics  . Alcohol use: No  . Drug use: No     Allergies   Morphine; Ace inhibitors; Aspirin; Losartan; Tapentadol; Tomato; and Adhesive [tape]   Review of Systems Review of Systems  Constitutional: Negative for fever.  HENT: Negative for congestion.   Respiratory: Negative for shortness of breath.   Cardiovascular:  Negative for chest pain.  Gastrointestinal: Negative for abdominal pain, nausea and vomiting.  Genitourinary: Positive for dysuria and flank pain.  Musculoskeletal: Positive for back pain.  Skin: Negative for rash.  Neurological: Negative for headaches.  Hematological: Does not bruise/bleed easily.  Psychiatric/Behavioral: Negative for confusion.     Physical Exam Updated Vital Signs BP (!) 142/83 (BP Location: Right Arm)   Pulse 67   Temp 97.7 F (36.5 C) (Oral)   Resp 18   Ht 1.626 m (5\' 4" )   Wt (!) 142.9 kg (315 lb)   SpO2 94%   BMI 54.07 kg/m   Physical Exam  Constitutional: She is oriented to person, place, and time. She appears well-developed and well-nourished. No distress.  HENT:  Head: Normocephalic and atraumatic.  Mouth/Throat: Oropharynx is clear and moist.  Eyes: Pupils are equal, round, and reactive to light. Conjunctivae and EOM are normal.  Neck: Normal range of motion. Neck supple.  Cardiovascular: Normal rate and regular rhythm.  Pulmonary/Chest: Effort normal and breath sounds normal.  Abdominal: Soft. Bowel sounds are normal. There is no tenderness.  Musculoskeletal: Normal range of motion.  Increased pain with range of motion of the left leg.  Some tenderness to palpation around the left lumbar part of the back.  Neurological: She is alert and oriented to person, place, and time. No cranial nerve deficit or sensory deficit. She exhibits normal muscle tone. Coordination normal.  Skin: Skin is warm.  Nursing note and vitals reviewed.    ED Treatments / Results  Labs (all labs ordered are listed, but only abnormal results are displayed) Labs Reviewed  BASIC METABOLIC PANEL - Abnormal; Notable for the following components:      Result Value   Glucose, Bld 133 (*)    All other components within normal limits  CBC WITH DIFFERENTIAL/PLATELET - Abnormal; Notable for the following components:   RBC 5.19 (*)    MCH 25.2 (*)    Platelets 451 (*)    All  other components within normal limits  URINALYSIS, ROUTINE W REFLEX MICROSCOPIC    EKG None  Radiology Ct Renal Stone Study  Result Date: 07/24/2017 CLINICAL DATA:  Left flank pain with dysuria EXAM: CT ABDOMEN AND PELVIS WITHOUT CONTRAST TECHNIQUE: Multidetector CT imaging of the abdomen and pelvis was performed following the standard protocol without oral or IV contrast. COMPARISON:  CT abdomen and pelvis July 23, 2012 FINDINGS: Lower chest: There is bibasilar lung atelectasis. No lung base edema or consolidation. Hepatobiliary: There is hepatic steatosis. There are multiple cysts throughout the liver. The largest cysts are in the left lobe. There is a cyst in the left lobe measuring 4.3 x 3.8 cm with an adjacent cyst measuring 3.4 x 3.2 cm. A third nearby cyst in the left lobe of the liver measures 3.1 x 3.2 cm. No non cystic liver lesions are appreciable on this noncontrast enhanced study. Gallbladder is absent. There is no biliary duct dilatation. Pancreas: No pancreatic mass or inflammatory focus evident. Spleen: No splenic lesions are evident. Adrenals/Urinary Tract: Adrenals bilaterally appear unremarkable. There is no appreciable renal mass or hydronephrosis on either side. There is no evident renal or ureteral calculus on either side. Urinary bladder is midline with wall thickness within normal limits. Stomach/Bowel: There is no appreciable bowel wall or mesenteric thickening. There are scattered colonic diverticula without diverticulitis. No bowel obstruction. No free air or portal venous  air. Vascular/Lymphatic: No abdominal aortic aneurysm. No vascular lesions are evident on this noncontrast enhanced study. No adenopathy is appreciable in the abdomen or pelvis. Reproductive: Uterus absent.  No pelvic mass evident. Other: Appendix appears unremarkable. No abscess or ascites is evident in the abdomen or pelvis. There is a small ventral hernia containing only fat. Musculoskeletal: There is  postoperative change at L4 and L5. There are no blastic or lytic bone lesions. There are benign lipomas in the lateral right gluteus medius and right obturator internus muscles. No abdominal wall lesions evident. IMPRESSION: 1.  No renal or ureteral calculus.  No hydronephrosis. 2. Hepatic steatosis with multiple cystic lesions throughout the liver. Largest cysts are in the left lobe with several cysts increased in size compared to prior studies. 3. Scattered colonic diverticula out diverticulitis. No bowel obstruction. No abscess. Appendix region appears normal. 4.  Uterus absent. 5.  Small ventral hernia containing only fat. Electronically Signed   By: Lowella Grip III M.D.   On: 07/24/2017 09:52    Procedures Procedures (including critical care time)  Medications Ordered in ED Medications  0.9 %  sodium chloride infusion (has no administration in time range)  HYDROmorphone (DILAUDID) injection 1 mg (1 mg Intravenous Given 07/24/17 0823)  ondansetron (ZOFRAN) injection 4 mg (4 mg Intravenous Given 07/24/17 0823)  sodium chloride 0.9 % bolus 500 mL (500 mLs Intravenous Given 07/24/17 9937)     Initial Impression / Assessment and Plan / ED Course  I have reviewed the triage vital signs and the nursing notes.  Pertinent labs & imaging results that were available during my care of the patient were reviewed by me and considered in my medical decision making (see chart for details).    Urinalysis negative for urinary tract infection.  CT renal study negative for any stones.  Feel symptoms are probably musculoskeletal in nature particularly since worse with movement.  Patient treated with pain medicine here which helped some.  Patient will be treated symptomatically.  Patient has follow-up with her primary care doctor.  Again believe that symptoms are muscular skeletal in nature.   Final Clinical Impressions(s) / ED Diagnoses   Final diagnoses:  Left flank pain    ED Discharge Orders     None       Fredia Sorrow, MD 07/24/17 [phone]

## 2017-07-24 NOTE — ED Notes (Signed)
Pt transported to CT ?

## 2017-07-24 NOTE — Discharge Instructions (Signed)
Workup for the left flank pain without any definitive findings no evidence of kidney stone no urinary tract infection.  Labs without any significant abnormality.  Clinically probably musculoskeletal since it hurts more to move the leg no recent trauma fall or injury.  Patient does have pain medicine at home for chronic pain.  She will take that she will follow-up with her primary care doctor in the next few days.

## 2017-07-24 NOTE — ED Triage Notes (Addendum)
PT c/o left flank pain with cloudy urine appearance x3 days. PT denies any n/v/d. PT states recent antibiotic completion for dx of UTI by her primary MD last week.

## 2017-07-31 ENCOUNTER — Telehealth: Payer: Self-pay | Admitting: Family Medicine

## 2017-07-31 NOTE — Telephone Encounter (Signed)
Patient is calling in requesting medication for cough with yellow mucus, lost voice, been going on since last tue.  She refuses to come in to see another physician.  Please advise. Cb#: [phone] [facility]acy: [facility]

## 2017-08-02 DIAGNOSIS — R21 Rash and other nonspecific skin eruption: Secondary | ICD-10-CM | POA: Diagnosis not present

## 2017-08-02 DIAGNOSIS — D485 Neoplasm of uncertain behavior of skin: Secondary | ICD-10-CM | POA: Diagnosis not present

## 2017-08-02 DIAGNOSIS — L400 Psoriasis vulgaris: Secondary | ICD-10-CM | POA: Diagnosis not present

## 2017-08-03 ENCOUNTER — Other Ambulatory Visit: Payer: Self-pay

## 2017-08-03 MED ORDER — LORATADINE 10 MG PO TABS: 10.0000 mg | ORAL_TABLET | Freq: Every day | ORAL | 5 refills | Status: DC

## 2017-08-03 NOTE — Telephone Encounter (Signed)
Patient havnig sneezing and cough and allergy symptoms, not taking loratadine so it was refilled- did not want to see another dr. Aletha Halim get the loratadine and call back if symptoms worsen

## 2017-08-10 ENCOUNTER — Other Ambulatory Visit: Payer: Self-pay | Admitting: Family Medicine

## 2017-08-15 ENCOUNTER — Ambulatory Visit (INDEPENDENT_AMBULATORY_CARE_PROVIDER_SITE_OTHER): Payer: Medicare Other | Admitting: Women's Health

## 2017-08-15 ENCOUNTER — Encounter: Payer: Self-pay | Admitting: Women's Health

## 2017-08-15 VITALS — BP 150/86 | HR 88 | Ht 64.0 in | Wt 299.0 lb

## 2017-08-15 DIAGNOSIS — R1010 Upper abdominal pain, unspecified: Secondary | ICD-10-CM | POA: Diagnosis not present

## 2017-08-15 DIAGNOSIS — R809 Proteinuria, unspecified: Secondary | ICD-10-CM | POA: Diagnosis not present

## 2017-08-15 DIAGNOSIS — R8290 Unspecified abnormal findings in urine: Secondary | ICD-10-CM | POA: Diagnosis not present

## 2017-08-15 DIAGNOSIS — N898 Other specified noninflammatory disorders of vagina: Secondary | ICD-10-CM | POA: Diagnosis not present

## 2017-08-15 LAB — POCT URINALYSIS DIPSTICK
Blood, UA: NEGATIVE
GLUCOSE UA: NEGATIVE
Ketones, UA: NEGATIVE
Leukocytes, UA: NEGATIVE
Nitrite, UA: NEGATIVE

## 2017-08-15 NOTE — Progress Notes (Signed)
   GYN VISIT Patient name: Angela [name] MRN [id number]  Date of birth: 06-Dec-1960 Chief Complaint:   Vaginal Discharge  History of Present Illness:   Angela [name] is a 57 y.o. [phone] African American female being seen today for report of vaginal odor x 1.5-2wks. Denies itching/irritation. Has recently become sexually active again with a new partner.  Also reports burning pain that began in lower abdomen, has now moved up to upper abd area. Denies UTI s/s, but wants urine dipped.     No LMP recorded. Patient has had a hysterectomy. The current method of family planning is status post hysterectomy for possible cancerous changes to cervix/pt unsure- still doing paps. Unable to access records b/c that physician is no longer in practice.  Last pap 2016. Results were:  normal Review of Systems:   Pertinent items are noted in HPI Denies fever/chills, dizziness, headaches, visual disturbances, fatigue, shortness of breath, chest pain, abdominal pain, vomiting, abnormal vaginal discharge/itching/odor/irritation, problems with periods, bowel movements, urination, or intercourse unless otherwise stated above.  Pertinent History Reviewed:  Reviewed past medical,surgical, social, obstetrical and family history.  Reviewed problem list, medications and allergies. Physical Assessment:   Vitals:   08/15/17 1433  BP: (!) 150/86  Pulse: 88  Weight: 299 lb (135.6 kg)  Height: 5\' 4"  (1.626 m)  Body mass index is 51.32 kg/m.       Physical Examination:   General appearance: alert, well appearing, and in no distress  Mental status: alert, oriented to person, place, and time  Skin: warm & dry   Cardiovascular: normal heart rate noted  Respiratory: normal respiratory effort, no distress  Abdomen: soft, non-tender   Pelvic: VULVA: normal appearing vulva with no masses, tenderness or lesions. Pt unable to get in optimal position for exam, doesn't do well w/ vaginal exams, so just inserted specimen swab  into vagina to collect sample  Extremities: no edema   Results for orders placed or performed in visit on 08/15/17 (from the past 24 hour(s))  POCT Urinalysis Dipstick   Collection Time: 08/15/17  3:17 PM  Result Value Ref Range   Color, UA     Clarity, UA     Glucose, UA neg    Bilirubin, UA     Ketones, UA neg    Spec Grav, UA  1.010 - 1.025   Blood, UA neg    pH, UA  5.0 - 8.0   Protein, UA 1+    Urobilinogen, UA  0.2 or 1.0 E.U./dL   Nitrite, UA neg    Leukocytes, UA Negative Negative   Appearance     Odor      Assessment & Plan:  1) Vaginal odor w/ recent resumption of sexual activity/new partner> Nuswab plus sent today  2) H/o hysterectomy for possible cancerous changes to cervix/still doing paps> due this year, will schedule today  3) Proteinuria> will send urine cx  Meds: No orders of the defined types were placed in this encounter.   Orders Placed This Encounter  Procedures  . Urine Culture  . NuSwab Vaginitis Plus (VG+)  . POCT Urinalysis Dipstick    Return for within next few weeks for pap smear.  [place], [facility] 08/15/2017 4:34 PM

## 2017-08-16 DIAGNOSIS — L400 Psoriasis vulgaris: Secondary | ICD-10-CM | POA: Diagnosis not present

## 2017-08-17 ENCOUNTER — Other Ambulatory Visit: Payer: Self-pay | Admitting: Family Medicine

## 2017-08-17 LAB — URINE CULTURE

## 2017-08-17 LAB — NUSWAB VAGINITIS PLUS (VG+)
CANDIDA ALBICANS, NAA: NEGATIVE
CANDIDA GLABRATA, NAA: NEGATIVE
Chlamydia trachomatis, NAA: NEGATIVE
NEISSERIA GONORRHOEAE, NAA: NEGATIVE
Trich vag by NAA: NEGATIVE

## 2017-08-20 ENCOUNTER — Other Ambulatory Visit: Payer: Self-pay | Admitting: Family Medicine

## 2017-08-20 ENCOUNTER — Telehealth: Payer: Self-pay | Admitting: Family Medicine

## 2017-08-20 NOTE — Telephone Encounter (Signed)
Pt would like Ambien called into [facility] [place], she mentioned something about it being denied,

## 2017-08-22 ENCOUNTER — Encounter: Payer: Medicare Other | Admitting: Women's Health

## 2017-08-23 NOTE — Progress Notes (Signed)
This encounter was created in error - please disregard.

## 2017-08-24 NOTE — Telephone Encounter (Signed)
Med sent.

## 2017-08-29 ENCOUNTER — Ambulatory Visit: Payer: Self-pay | Admitting: Family Medicine

## 2017-09-06 ENCOUNTER — Other Ambulatory Visit: Payer: Self-pay | Admitting: Family Medicine

## 2017-09-17 ENCOUNTER — Other Ambulatory Visit: Payer: Medicare Other | Admitting: Women's Health

## 2017-09-20 ENCOUNTER — Other Ambulatory Visit: Payer: Self-pay | Admitting: Family Medicine

## 2017-09-20 DIAGNOSIS — M62830 Muscle spasm of back: Secondary | ICD-10-CM

## 2017-09-30 ENCOUNTER — Other Ambulatory Visit: Payer: Self-pay | Admitting: Family Medicine

## 2017-10-03 ENCOUNTER — Other Ambulatory Visit: Payer: Self-pay | Admitting: Family Medicine

## 2017-10-08 ENCOUNTER — Ambulatory Visit (INDEPENDENT_AMBULATORY_CARE_PROVIDER_SITE_OTHER): Payer: Medicare Other | Admitting: Family Medicine

## 2017-10-08 ENCOUNTER — Encounter: Payer: Self-pay | Admitting: Family Medicine

## 2017-10-08 VITALS — BP 160/90 | HR 76 | Resp 16 | Ht 64.0 in | Wt 305.0 lb

## 2017-10-08 DIAGNOSIS — L409 Psoriasis, unspecified: Secondary | ICD-10-CM | POA: Diagnosis not present

## 2017-10-08 DIAGNOSIS — R52 Pain, unspecified: Secondary | ICD-10-CM

## 2017-10-08 DIAGNOSIS — E559 Vitamin D deficiency, unspecified: Secondary | ICD-10-CM | POA: Diagnosis not present

## 2017-10-08 DIAGNOSIS — F418 Other specified anxiety disorders: Secondary | ICD-10-CM | POA: Diagnosis not present

## 2017-10-08 DIAGNOSIS — E1169 Type 2 diabetes mellitus with other specified complication: Secondary | ICD-10-CM | POA: Diagnosis not present

## 2017-10-08 DIAGNOSIS — E119 Type 2 diabetes mellitus without complications: Secondary | ICD-10-CM

## 2017-10-08 DIAGNOSIS — R55 Syncope and collapse: Secondary | ICD-10-CM | POA: Diagnosis not present

## 2017-10-08 DIAGNOSIS — M62830 Muscle spasm of back: Secondary | ICD-10-CM | POA: Diagnosis not present

## 2017-10-08 DIAGNOSIS — I10 Essential (primary) hypertension: Secondary | ICD-10-CM | POA: Diagnosis not present

## 2017-10-08 DIAGNOSIS — R7989 Other specified abnormal findings of blood chemistry: Secondary | ICD-10-CM

## 2017-10-08 DIAGNOSIS — E785 Hyperlipidemia, unspecified: Secondary | ICD-10-CM

## 2017-10-08 MED ORDER — CYCLOBENZAPRINE HCL 10 MG PO TABS: 10.0000 mg | ORAL_TABLET | Freq: Three times a day (TID) | ORAL | 3 refills | Status: DC

## 2017-10-08 MED ORDER — AMLODIPINE BESYLATE 10 MG PO TABS: 10.0000 mg | ORAL_TABLET | Freq: Every day | ORAL | 5 refills | Status: DC

## 2017-10-08 MED ORDER — DIAZEPAM 5 MG PO TABS: ORAL_TABLET | ORAL | 5 refills | Status: DC

## 2017-10-08 MED ORDER — CLONIDINE HCL 0.3 MG PO TABS: ORAL_TABLET | ORAL | 1 refills | Status: DC

## 2017-10-08 NOTE — Progress Notes (Signed)
Fill Date ID Written Drug Qty Days Prescriber Rx # Pharmacy Refill Daily Dose * Pymt Type PMP  10/07/2017  1  07/05/2017  Oxycontin Er 40 Mg Tablet  60 59 Ma Sim  [id number]  [place] ([id number])  0 120.00 MME Medicare  [place]  10/03/2017  1  08/24/2017  Zolpidem Tartrate 10 Mg Tablet  30 30 Ma Sim  [id number]  [place] ([id number])  1 0.50 LME Medicare  [place]  09/07/2017  1  09/07/2017  Diazepam 5 Mg Tablet  90 30 Ma Sim  [id number]  [place] (6833)  0 1.50 LME Medicare  [place]  09/07/2017  1  09/04/2017  Oxycontin Er 40 Mg Tablet  60 74 Ma Sim  [id number]  [place] (6833)  0 120.00 MME Medicare  [place]  08/24/2017  1  08/24/2017  Zolpidem Tartrate 10 Mg Tablet  30 30 Ma Sim  [id number]  [place] (6833)  0 0.50 LME Medicare  [place]  08/10/2017  1  07/05/2017  Oxycontin Er 40 Mg Tablet  60 30 Ma Sim  [id number]  [place] (6833)  0 120.00 MME Medicare  [place]  08/10/2017  1  05/09/2017  Diazepam 5 Mg Tablet  90 30 Ma Sim  [id number]  [place] (6833)  3 1.50 LME Medicare  [place]  07/10/2017  1  01/29/2017  Zolpidem Tartrate 10 Mg Tablet  30 30 Ma Sim  [id number]  [place] (6833)  1 0.50 LME Medicare  [place]  07/10/2017  1  05/09/2017  Diazepam 5 Mg Tablet  90 30 Ma Sim  [id number]  [place] (6833)  2 1.50 LME Medicare  [place]  07/10/2017  1  04/19/2017  Oxycontin Er 40 Mg Tablet  60 30 Ma Sim  [id number]  [place] (6833)  0 120.00 MME Medicare  [place]  06/13/2017  1  04/19/2017  Oxycontin Er 40 Mg Tablet  60 30 Ma Sim  [id number]  [place] (6833)  0 120.00 MME Medicare  [place]  06/13/2017  1  05/09/2017  Diazepam 5 Mg Tablet  90 30 Ma Sim  [id number]  [place] (6833)  1 1.50 LME Medicare  [place]  06/07/2017  1  01/29/2017  Zolpidem Tartrate 10 Mg Tablet  30 30 Ma Sim  [id number]  [place] (6833)  2 0.50 LME Medicare  [place]  05/16/2017  1  04/19/2017  Oxycontin Er 40 Mg Tablet  60 30 Ma Sim  [id number]  [place] (6833)  0 120.00 MME Medicare  [place]  05/15/2017  1  05/09/2017  Diazepam 5 Mg Tablet  90 30 Ma Sim  [id number]  [place] (6833)  0 1.50 LME Medicare  [place]  05/08/2017  1  01/29/2017  Zolpidem Tartrate 10 Mg Tablet  30 30 Ma Sim  [id number]  [place] (6833)  1 0.50 LME  Medicare  [place]  04/14/2017  1  01/29/2017  Diazepam 5 Mg Tablet  90 30 Ma Sim  [id number]  [place] (6833)  0 1.50 LME Medicare  [place]  04/14/2017  1  01/29/2017  Oxycontin Er 40 Mg Tablet  60 30 Ma Sim  [id number]  [place] (6833)  0 120.00 MME Medicare  [place]  04/07/2017  1  01/29/2017  Zolpidem Tartrate 10 Mg Tablet  30 30 Ma Sim  [id number]  [place] (6833)  0 0.50 LME Medicare  [place]  03/15/2017  1  01/29/2017  Oxycontin Er 40 Mg Tablet  60 30 Ma Sim  [id number]  [place] (1937)  0 120.00 MME Medicare    03/11/2017  1  01/29/2017  Diazepam 5 Mg Tablet  90 30 Ma Sim  [id number]  [place] (6833)  0 1.50 LME Medicare  [place]  03/08/2017  1  01/29/2017  Zolpidem Tartrate 10 Mg Tablet  30 30 Ma Sim  [id number]  [place] (6833)  0 0.50 LME Medicare  [place]  02/14/2017  1  01/29/2017  Oxycontin Er 40 Mg Tablet  60 30 Ma Sim  [id number]  [place] (6833)  0 120.00 MME Medicare  [place]  02/10/2017  1  01/29/2017  Diazepam 5 Mg Tablet

## 2017-10-08 NOTE — Patient Instructions (Signed)
F/U in 4 months, call if you need me before  You will be contacted re labs  You are referred to telepsychiatry

## 2017-10-09 ENCOUNTER — Telehealth: Payer: Self-pay

## 2017-10-09 NOTE — Telephone Encounter (Signed)
[facility] - Patient reports that she is out of town and I can call her back on Monday 10-15-2017.

## 2017-10-11 ENCOUNTER — Other Ambulatory Visit: Payer: Self-pay

## 2017-10-11 ENCOUNTER — Telehealth: Payer: Self-pay | Admitting: Family Medicine

## 2017-10-11 NOTE — Telephone Encounter (Signed)
[facility]   Needs a Rescue Inhalor --transffered Pharmacy [phone] STORE [phone] to brandi

## 2017-10-14 DIAGNOSIS — R55 Syncope and collapse: Secondary | ICD-10-CM | POA: Insufficient documentation

## 2017-10-14 DIAGNOSIS — R7989 Other specified abnormal findings of blood chemistry: Secondary | ICD-10-CM | POA: Insufficient documentation

## 2017-10-14 MED ORDER — OXYCODONE HCL ER 40 MG PO T12A: EXTENDED_RELEASE_TABLET | ORAL | 0 refills | Status: AC

## 2017-10-14 NOTE — Assessment & Plan Note (Signed)
New finding in June 2019, pt needs thyroid US, will likely need supplemental synthroid Free T3 and T4 added

## 2017-10-14 NOTE — Assessment & Plan Note (Signed)
The patient's Controlled Substance registry is reviewed and compliance confirmed. Adequacy of  Pain control and level of function is assessed. Medication dosing is adjusted as deemed appropriate. Twelve weeks of medication is prescribed , patient signs for the script and is provided with a follow up appointment between 11 to 12 weeks .  

## 2017-10-14 NOTE — Assessment & Plan Note (Signed)
Uncontrolled, states did not take oral med this morning DASH diet and commitment to daily physical activity for a minimum of 30 minutes discussed and encouraged, as a part of hypertension management. The importance of attaining a healthy weight is also discussed.  BP/Weight 10/08/2017 08/15/2017 07/24/2017 07/05/2017 04/19/2017 01/29/2017 12/17/5907  Systolic BP 311 216 244 695 072 257 505  Diastolic BP 90 86 73 84 90 92 83  Wt. (Lbs) 305 299 315 321 304.08 308 309  BMI 52.35 51.32 54.07 55.1 52.2 52.87 53.04

## 2017-10-14 NOTE — Progress Notes (Signed)
Angela [name]     MRN: [id number]      DOB: [birth year]   HPI Angela [name] is here for follow up and re-evaluation of chronic medical conditions, medication management and review of any available recent lab and radiology data.  Preventive health is updated, specifically  Cancer screening and Immunization.   The PT denies any adverse reactions to current medications since the last visit.  Extremely tearful and depressed as she describes a lot of pain relating to the behavior and downward spiral of her only child a daughter, now in legal triouble.Patient is not suicidal or homicidal, she is overwhelmed, now requests therapy She has chronic uncontrolled physical and mental pain, has poor control over her diet with subsequest excessive weight gain. She describes and episode several wekks ago when she experienced an episode of loss of conciousnees which lasted for several hours, was not withnessed by anyone , and was not accompanid by any incontinence, she wants this furhter evaluated. She has no h/o seizure , this has not happened in the past  ROS Denies recent fever or chills. Denies sinus pressure, nasal congestion, ear pain or sore throat. Denies chest congestion, productive cough or wheezing. Denies chest pains, palpitations and leg swelling Denies abdominal pain, nausea, vomiting,diarrhea or constipation.   Denies dysuria, frequency, hesitancy or incontinence.  Denies skin break down or rash.   PE  BP (!) 160/90   Pulse 76   Resp 16   Ht 5\' 4"  (1.626 m)   Wt (!) 305 lb (138.3 kg)   SpO2 99%   BMI 52.35 kg/m   Patient alert and oriented and in no cardiopulmonary distress.  HEENT: No facial asymmetry, EOMI,   oropharynx pink and moist.  Neck supple no JVD, no mass.  Chest: Clear to auscultation bilaterally.  CVS: S1, S2 no murmurs, no S3.Regular rate.  ABD: Soft non tender.   Ext: trace  edema  [id number]  ROM spine, shoulders, hips and knees.  Skin: Sibilate  hyperpigment rash on both lower extremities  Psych: Good eye contact, tearful affect. Memory intact  anxious and  depressed appearing.  CNS: CN 2-12 intact, power,  normal throughout.no focal deficits noted.   Assessment & Plan  Morbid obesity Deteriorated. Patient re-educated about  the importance of commitment to a  minimum of 150 minutes of exercise per week.  The importance of healthy food choices with portion control discussed. Encouraged to start a food diary, count calories and to consider  joining a support group. Sample diet sheets offered. Goals set by the patient for the next several months.   Weight /BMI 10/08/2017 08/15/2017 07/24/2017  WEIGHT 305 lb 299 lb 315 lb  HEIGHT 5\' 4"  5\' 4"  5\' 4"   BMI 52.35 kg/m2 51.32 kg/m2 54.07 kg/m2      Diabetes mellitus without complication Deteriorated, needs to reduce carbohydrate and woirk on weight loss Angela [name] is reminded of the importance of commitment to daily physical activity for 30 minutes or more, as able and the need to limit carbohydrate intake to 30 to 60 grams per meal to help with blood sugar control.   The need to take medication as prescribed, test blood sugar as directed, and to call between visits if there is a concern that blood sugar is uncontrolled is also discussed.   Angela [name] is reminded of the importance of daily foot exam, annual eye examination, and good blood sugar, blood pressure and cholesterol control.  Diabetic Labs Latest Ref Rng & Units 10/08/2017  07/24/2017 07/06/2017 01/25/2017 08/21/2016  HbA1c <5.7 % of total Hgb 7.2(H) - - 6.8(H) 6.8(H)  Microalbumin mg/dL 2.9 - 33.7(H) - -  Micro/Creat Ratio <30 mcg/mg creat 20 - 107.3(H) - -  Chol <200 mg/dL 181 - - 151 167  HDL >50 mg/dL 47(L) - - 46(L) 50(L)  Calc LDL mg/dL (calc) 115(H) - - 87 96  Triglycerides <150 mg/dL 90 - - 90 107  Creatinine 0.50 - 1.05 mg/dL 0.80 0.94 - 0.71 0.69   BP/Weight 10/08/2017 08/15/2017 07/24/2017 07/05/2017 04/19/2017 01/29/2017  4/58/0998  Systolic BP 338 250 539 767 341 937 902  Diastolic BP 90 86 73 84 90 92 83  Wt. (Lbs) 305 299 315 321 304.08 308 309  BMI 52.35 51.32 54.07 55.1 52.2 52.87 53.04   Foot/eye exam completion dates Latest Ref Rng & Units 11/06/2016 03/07/2016  Eye Exam No Retinopathy - No Retinopathy  Foot Form Completion - Done -   No med change      Depression with anxiety Currently on Cymbalta, at maximum dose. Deterioration due to stress because of her only daughter who has significant behavioral and mental health problems which are not being teated, now in legal trouble. [name] is not suicidal or homicidal. requests and will benefit from telepsych, will refer    Vitamin D deficiency Not treated needs weekly vit D will be sent once patient is unified, medication is entered  Hyperlipidemia LDL goal <70 Not at goal, needs to reduce fat in diet, and will change to crestor Hyperlipidemia:Low fat diet discussed and encouraged.   Lipid Panel  Lab Results  Component Value Date   CHOL 181 10/08/2017   HDL 47 (L) 10/08/2017   LDLCALC 115 (H) 10/08/2017   TRIG 90 10/08/2017   CHOLHDL 3.9 10/08/2017       Psoriasis Improving  Encounter for pain management The patient's Controlled Substance registry is reviewed and compliance confirmed. Adequacy of  Pain control and level of function is assessed. Medication dosing is adjusted as deemed appropriate. Twelve weeks of medication is prescribed , patient signs for the script and is provided with a follow up appointment between 11 to 12 weeks .   Syncope Patient reports a single syncopal episode several weeks ago which was unwitnessed, no accompanying incontinence, ? severaal hours duration, needs further evaluation by neurology. Compliance with management of her leep apnea is not good and her BP is uncontrolled  Essential hypertension Uncontrolled, states did not take oral med this morning DASH diet and commitment to daily physical  activity for a minimum of 30 minutes discussed and encouraged, as a part of hypertension management. The importance of attaining a healthy weight is also discussed.  BP/Weight 10/08/2017 08/15/2017 07/24/2017 07/05/2017 04/19/2017 01/29/2017 08/08/7351  Systolic BP 299 242 683 419 622 297 989  Diastolic BP 90 86 73 84 90 92 83  Wt. (Lbs) 305 299 315 321 304.08 308 309  BMI 52.35 51.32 54.07 55.1 52.2 52.87 53.04       Elevated TSH New finding in June 2019, pt needs thyroid US, will likely need supplemental synthroid Free T3 and T4 added

## 2017-10-14 NOTE — Assessment & Plan Note (Signed)
Not at goal, needs to reduce fat in diet, and will change to crestor Hyperlipidemia:Low fat diet discussed and encouraged.   Lipid Panel  Lab Results  Component Value Date   CHOL 181 10/08/2017   HDL 47 (L) 10/08/2017   LDLCALC 115 (H) 10/08/2017   TRIG 90 10/08/2017   CHOLHDL 3.9 10/08/2017

## 2017-10-14 NOTE — Assessment & Plan Note (Signed)
Deteriorated, needs to reduce carbohydrate and woirk on weight loss Angela [name] is reminded of the importance of commitment to daily physical activity for 30 minutes or more, as able and the need to limit carbohydrate intake to 30 to 60 grams per meal to help with blood sugar control.   The need to take medication as prescribed, test blood sugar as directed, and to call between visits if there is a concern that blood sugar is uncontrolled is also discussed.   Angela [name] is reminded of the importance of daily foot exam, annual eye examination, and good blood sugar, blood pressure and cholesterol control.  Diabetic Labs Latest Ref Rng & Units 10/08/2017 07/24/2017 07/06/2017 01/25/2017 08/21/2016  HbA1c <5.7 % of total Hgb 7.2(H) - - 6.8(H) 6.8(H)  Microalbumin mg/dL 2.9 - 33.7(H) - -  Micro/Creat Ratio <30 mcg/mg creat 20 - 107.3(H) - -  Chol <200 mg/dL 181 - - 151 167  HDL >50 mg/dL 47(L) - - 46(L) 50(L)  Calc LDL mg/dL (calc) 115(H) - - 87 96  Triglycerides <150 mg/dL 90 - - 90 107  Creatinine 0.50 - 1.05 mg/dL 0.80 0.94 - 0.71 0.69   BP/Weight 10/08/2017 08/15/2017 07/24/2017 07/05/2017 04/19/2017 01/29/2017 0/06/7046  Systolic BP 889 169 450 388 828 003 491  Diastolic BP 90 86 73 84 90 92 83  Wt. (Lbs) 305 299 315 321 304.08 308 309  BMI 52.35 51.32 54.07 55.1 52.2 52.87 53.04   Foot/eye exam completion dates Latest Ref Rng & Units 11/06/2016 03/07/2016  Eye Exam No Retinopathy - No Retinopathy  Foot Form Completion - Done -   No med change

## 2017-10-14 NOTE — Assessment & Plan Note (Signed)
Deteriorated. Patient re-educated about  the importance of commitment to a  minimum of 150 minutes of exercise per week.  The importance of healthy food choices with portion control discussed. Encouraged to start a food diary, count calories and to consider  joining a support group. Sample diet sheets offered. Goals set by the patient for the next several months.   Weight /BMI 10/08/2017 08/15/2017 07/24/2017  WEIGHT 305 lb 299 lb 315 lb  HEIGHT 5\' 4"  5\' 4"  5\' 4"   BMI 52.35 kg/m2 51.32 kg/m2 54.07 kg/m2

## 2017-10-14 NOTE — Assessment & Plan Note (Signed)
Currently on Cymbalta, at maximum dose. Deterioration due to stress because of her only daughter who has significant behavioral and mental health problems which are not being teated, now in legal trouble. [name] is not suicidal or homicidal. requests and will benefit from telepsych, will refer

## 2017-10-14 NOTE — Assessment & Plan Note (Signed)
Patient reports a single syncopal episode several weeks ago which was unwitnessed, no accompanying incontinence, ? severaal hours duration, needs further evaluation by neurology. Compliance with management of her leep apnea is not good and her BP is uncontrolled

## 2017-10-14 NOTE — Progress Notes (Signed)
Angela [name]     MRN: [id number]      DOB: 1961-04-16   HPI Angela [name] is here for follow up and re-evaluation of chronic medical conditions, medication management and review of any available recent lab and radiology data.  Preventive health is updated, specifically  Cancer screening and Immunization.   Questions or concerns regarding consultations or procedures which the PT has had in the interim are  addressed. The PT denies any adverse reactions to current medications since the last visit.  There are no new concerns.  There are no specific complaints   ROS Denies recent fever or chills. Denies sinus pressure, nasal congestion, ear pain or sore throat. Denies chest congestion, productive cough or wheezing. Denies chest pains, palpitations and leg swelling Denies abdominal pain, nausea, vomiting,diarrhea or constipation.   Denies dysuria, frequency, hesitancy or incontinence.   PE  BP (!) 160/90   Pulse 76   Resp 16   Ht 5\' 4"  (1.626 m)   Wt (!) 305 lb (138.3 kg)   SpO2 99%   BMI 52.35 kg/m   Patient alert and oriented and in no cardiopulmonary distress.  HEENT: No facial asymmetry, EOMI,   oropharynx pink and moist.  Neck supple no JVD, no mass.  Chest: Clear to auscultation bilaterally.  CVS: S1, S2 no murmurs, no S3.Regular rate.  ABD: Soft non tender.   Ext: trace edema edema  MS: decreased  ROM spine, shoulders, hips and knees.  Skin: hyperpigmented  Macular rash onboth lower extremities  Psych: Good eye contact, tearfull affect. Memory intact  anxious and  depressed appearing.  CNS: CN 2-12 intact, power,  normal throughout.no focal deficits noted.   Assessment & Plan  Morbid obesity Deteriorated. Patient re-educated about  the importance of commitment to a  minimum of 150 minutes of exercise per week.  The importance of healthy food choices with portion control discussed. Encouraged to start a food diary, count calories and to consider  joining a  support group. Sample diet sheets offered. Goals set by the patient for the next several months.   Weight /BMI 10/08/2017 08/15/2017 07/24/2017  WEIGHT 305 lb 299 lb 315 lb  HEIGHT 5\' 4"  5\' 4"  5\' 4"   BMI 52.35 kg/m2 51.32 kg/m2 54.07 kg/m2      Diabetes mellitus without complication Deteriorated, needs to reduce carbohydrate and woirk on weight loss Angela [name] is reminded of the importance of commitment to daily physical activity for 30 minutes or more, as able and the need to limit carbohydrate intake to 30 to 60 grams per meal to help with blood sugar control.   The need to take medication as prescribed, test blood sugar as directed, and to call between visits if there is a concern that blood sugar is uncontrolled is also discussed.   Angela [name] is reminded of the importance of daily foot exam, annual eye examination, and good blood sugar, blood pressure and cholesterol control.  Diabetic Labs Latest Ref Rng & Units 10/08/2017 07/24/2017 07/06/2017 01/25/2017 08/21/2016  HbA1c <5.7 % of total Hgb 7.2(H) - - 6.8(H) 6.8(H)  Microalbumin mg/dL 2.9 - 33.7(H) - -  Micro/Creat Ratio <30 mcg/mg creat 20 - 107.3(H) - -  Chol <200 mg/dL 181 - - 151 167  HDL >50 mg/dL 47(L) - - 46(L) 50(L)  Calc LDL mg/dL (calc) 115(H) - - 87 96  Triglycerides <150 mg/dL 90 - - 90 107  Creatinine 0.50 - 1.05 mg/dL 0.80 0.94 - 0.71 0.69   BP/Weight  10/08/2017 08/15/2017 07/24/2017 07/05/2017 04/19/2017 01/29/2017 01/02/91  Systolic BP 330 076 226 333 545 625 638  Diastolic BP 90 86 73 84 90 92 83  Wt. (Lbs) 305 299 315 321 304.08 308 309  BMI 52.35 51.32 54.07 55.1 52.2 52.87 53.04   Foot/eye exam completion dates Latest Ref Rng & Units 11/06/2016 03/07/2016  Eye Exam No Retinopathy - No Retinopathy  Foot Form Completion - Done -   No med change      Depression with anxiety Currently on Cymbalta, at maximum dose. Deterioration due to stress because of her only daughter who has significant behavioral and mental  health problems which are not being teated, now in legal trouble. [name] is not suicidal or homicidal. requests and will benefit from telepsych, will refer    Vitamin D deficiency Not treated needs weekly vit D will be sent once patient is unified, medication is entered  Hyperlipidemia LDL goal <70 Not at goal, needs to reduce fat in diet, and will change to crestor Hyperlipidemia:Low fat diet discussed and encouraged.   Lipid Panel  Lab Results  Component Value Date   CHOL 181 10/08/2017   HDL 47 (L) 10/08/2017   LDLCALC 115 (H) 10/08/2017   TRIG 90 10/08/2017   CHOLHDL 3.9 10/08/2017       Psoriasis Improving  Encounter for pain management The patient's Controlled Substance registry is reviewed and compliance confirmed. Adequacy of  Pain control and level of function is assessed. Medication dosing is adjusted as deemed appropriate. Twelve weeks of medication is prescribed , patient signs for the script and is provided with a follow up appointment between 11 to 12 weeks .   Syncope Patient reports a single syncopal episode several weeks ago which was unwitnessed, no accompanying incontinence, ? severaal hours duration, needs further evaluation by neurology. Compliance with management of her leep apnea is not good and her BP is uncontrolled  Essential hypertension Uncontrolled, states did not take oral med this morning DASH diet and commitment to daily physical activity for a minimum of 30 minutes discussed and encouraged, as a part of hypertension management. The importance of attaining a healthy weight is also discussed.  BP/Weight 10/08/2017 08/15/2017 07/24/2017 07/05/2017 04/19/2017 01/29/2017 9/37/3428  Systolic BP 768 115 726 203 559 741 638  Diastolic BP 90 86 73 84 90 92 83  Wt. (Lbs) 305 299 315 321 304.08 308 309  BMI 52.35 51.32 54.07 55.1 52.2 52.87 53.04       Elevated TSH New finding in June 2019, pt needs thyroid US, will likely need supplemental  synthroid Free T3 and T4 added

## 2017-10-14 NOTE — Assessment & Plan Note (Signed)
Improving.

## 2017-10-14 NOTE — Assessment & Plan Note (Signed)
Not treated needs weekly vit D will be sent once patient is unified, medication is entered

## 2017-10-15 ENCOUNTER — Other Ambulatory Visit: Payer: Medicare Other | Admitting: Women's Health

## 2017-10-16 ENCOUNTER — Other Ambulatory Visit: Payer: Self-pay

## 2017-10-16 LAB — TSH: TSH: 7.22 mIU/L — ABNORMAL HIGH (ref 0.40–4.50)

## 2017-10-16 LAB — COMPLETE METABOLIC PANEL WITH GFR
AG Ratio: 1.2 (calc) (ref 1.0–2.5)
ALKALINE PHOSPHATASE (APISO): 88 U/L (ref 33–130)
ALT: 25 U/L (ref 6–29)
AST: 41 U/L — AB (ref 10–35)
Albumin: 4.2 g/dL (ref 3.6–5.1)
BUN: 17 mg/dL (ref 7–25)
CO2: 29 mmol/L (ref 20–32)
CREATININE: 0.8 mg/dL (ref 0.50–1.05)
Calcium: 9.4 mg/dL (ref 8.6–10.4)
Chloride: 100 mmol/L (ref 98–110)
GFR, Est African American: 95 mL/min/{1.73_m2} (ref >=60)
GFR, Est Non African American: 82 mL/min/{1.73_m2} (ref >=60)
GLOBULIN: 3.4 g/dL (ref 1.9–3.7)
Glucose, Bld: 124 mg/dL — ABNORMAL HIGH (ref 65–99)
Potassium: 4 mmol/L (ref 3.5–5.3)
SODIUM: 138 mmol/L (ref 135–146)
Total Bilirubin: 0.3 mg/dL (ref 0.2–1.2)
Total Protein: 7.6 g/dL (ref 6.1–8.1)

## 2017-10-16 LAB — TEST AUTHORIZATION 2

## 2017-10-16 LAB — HEMOGLOBIN A1C
Hgb A1c MFr Bld: 7.2 % of total Hgb — ABNORMAL HIGH (ref <5.7)
Mean Plasma Glucose: 160 (calc)
eAG (mmol/L): 8.9 (calc)

## 2017-10-16 LAB — LIPID PANEL
CHOL/HDL RATIO: 3.9 (calc) (ref <5.0)
CHOLESTEROL: 181 mg/dL (ref <200)
HDL: 47 mg/dL — AB (ref >50)
LDL Cholesterol (Calc): 115 mg/dL (calc) — ABNORMAL HIGH
Non-HDL Cholesterol (Calc): 134 mg/dL (calc) — ABNORMAL HIGH (ref <130)
Triglycerides: 90 mg/dL (ref <150)

## 2017-10-16 LAB — T4, FREE

## 2017-10-16 LAB — MICROALBUMIN / CREATININE URINE RATIO
CREATININE, URINE: 148 mg/dL (ref 20–275)
MICROALB UR: 2.9 mg/dL
MICROALB/CREAT RATIO: 20 ug/mg{creat} (ref <30)

## 2017-10-16 LAB — T3, FREE

## 2017-10-16 LAB — VITAMIN D 25 HYDROXY (VIT D DEFICIENCY, FRACTURES): Vit D, 25-Hydroxy: 18 ng/mL — ABNORMAL LOW (ref 30–100)

## 2017-10-16 MED ORDER — ROSUVASTATIN CALCIUM 40 MG PO TABS: 40.0000 mg | ORAL_TABLET | Freq: Every day | ORAL | 3 refills | Status: DC

## 2017-10-18 ENCOUNTER — Ambulatory Visit (HOSPITAL_COMMUNITY)
Admission: RE | Admit: 2017-10-18 | Discharge: 2017-10-18 | Disposition: A | Discharge status: Home / Self Care | Payer: Medicare Other | Source: Ambulatory Visit | Attending: Family Medicine | Admitting: Family Medicine

## 2017-10-18 DIAGNOSIS — E039 Hypothyroidism, unspecified: Secondary | ICD-10-CM | POA: Diagnosis not present

## 2017-10-18 DIAGNOSIS — R7989 Other specified abnormal findings of blood chemistry: Secondary | ICD-10-CM | POA: Insufficient documentation

## 2017-10-23 ENCOUNTER — Telehealth: Payer: Self-pay

## 2017-10-23 NOTE — Telephone Encounter (Signed)
Writer left message on 10-15-2017 and 10-23-2017.

## 2017-11-04 ENCOUNTER — Other Ambulatory Visit: Payer: Self-pay | Admitting: Family Medicine

## 2017-11-08 ENCOUNTER — Telehealth: Payer: Self-pay

## 2017-11-08 NOTE — Telephone Encounter (Signed)
Writer has made several attempts to contact the patient without success.  Patient will be placed on the inactive list. If the patient requires services again please submit another referral in the [facility] pool.   Writer will route this information to the PCP and Dr. Modesta Messing.

## 2017-11-12 ENCOUNTER — Telehealth: Payer: Self-pay

## 2017-11-12 ENCOUNTER — Ambulatory Visit: Payer: Medicare Other | Admitting: Neurology

## 2017-11-12 ENCOUNTER — Encounter: Payer: Self-pay | Admitting: Neurology

## 2017-11-12 NOTE — Telephone Encounter (Signed)
Pt did not show for their appt with Dr. Athar today.  

## 2017-11-13 ENCOUNTER — Encounter: Payer: Self-pay | Admitting: Neurology

## 2017-11-14 ENCOUNTER — Encounter: Payer: Self-pay | Admitting: Neurology

## 2017-11-14 ENCOUNTER — Ambulatory Visit (INDEPENDENT_AMBULATORY_CARE_PROVIDER_SITE_OTHER): Payer: Medicare Other | Admitting: Neurology

## 2017-11-14 VITALS — BP 157/94 | HR 88 | Ht 64.0 in | Wt 307.0 lb

## 2017-11-14 DIAGNOSIS — Z9989 Dependence on other enabling machines and devices: Secondary | ICD-10-CM

## 2017-11-14 DIAGNOSIS — G4733 Obstructive sleep apnea (adult) (pediatric): Secondary | ICD-10-CM | POA: Diagnosis not present

## 2017-11-14 NOTE — Patient Instructions (Signed)

## 2017-11-14 NOTE — Progress Notes (Signed)
Subjective:    [name] is a 57 y.o. female.  HPI     Interim history:   Angela [name] is a 57 year old right-handed woman with an underlying medical history of recurrent vertigo, allergic rhinitis, asthma, hypertension, chronic back pain, s/p 4 back surgeries (Dr. Joya Salm x 2, Dr. Harl Bowie x 2), on chronic narcotic pain medication, and morbid obesity, who presents for follow-up consultation of her OSA, on CPAP therapy. The patient is unaccompanied today. I last saw her on 11/09/2016, at which time she was compliant with her CPAP and reported that sleep was much improved sleep quality and sleep consolidation were better, nocturia had improved and daytime symptoms were better. We talked about the importance of full compliance with CPAP and working on weight loss.  Today, 11/14/2017: I reviewed her CPAP compliance data from 10/14/2017 through 11/12/2017 which is a total of 30 days, during which time she used her CPAP 26 days with percent used days greater than 4 hours at 80%, indicating very good compliance with an average usage of 8 hours and 11 minutes, residual AHI at goal at 1.8 per hour, leak acceptable with the 95th percentile at 11.4 L/m on a pressure of 10 cm with EPR of 3. She reports doing well with her CPAP, gets her supplies through [employer]. Motivated to work on weight loss. Recent diagnosis of psoriasis through a dermatologist in [place]. She reports a recent syncopal spell. She admits that she does not drink enough water typically. She has recently had an elevated TSH. She continues to take narcotic pain medication for back pain.   The patient's allergies, current medications, family history, past medical history, past social history, past surgical history and problem list were reviewed and updated as appropriate.   Previously (copied from previous notes for reference):    I first met her on 12/29/2015 at the request of her primary care physician, at which  time she reported snoring and excessive daytime somnolence as well as multiple nighttime awakenings. She was on narcotic pain medication at the time through Dr. Brien Few. I invited her for a sleep study. She had a split-night sleep study on 02/03/2016. Baseline sleep efficiency was 71.8%, sleep latency prolonged at 40.5 minutes, REM sleep and slow-wave sleep were absent. Average oxygen saturation was 97%, nadir was 86% during non-REM sleep, total AHI 36.7 per hour. She was  titrated on CPAP from 5 cm to 10 cm. She did not achieve REM sleep during the titration portion of the study, average oxygen saturation was 90%, nadir was 87%. She did not have any significant PLMS or EKG changes during the study. Based on her test results are prescribed CPAP therapy for home use.   I reviewed her CPAP compliance data from 10/09/2016 through 11/07/2016, which is a total of 30 days, during which time she used her CPAP every night with percent used days greater than 4 hours at 83%, indicating very good compliance with an average usage of 6 hours and 36 minutes, residual AHI 1.9 per hour, leak acceptable for the 95th percentile at 10.9 L/m on a pressure of 10 cm with EPR of 3.  12/29/2015: (She) reports snoring and excessive daytime somnolence. I reviewed your office note from 12/03/2015. She reports multiple nighttime awakenings and also waking up with a sense of choking, wakes up coughing as well. Asthma has been worsening. She has been oxycontin bid, but not when planning to drive.  She takes zanaflex qid. She has nocturia up to 6  times per night.    She sleeps elevated with up to 6 pillows. Her Epworth sleepiness score is 7 out of 24 today, her fatigue score is 29 out of 63. She does not keep a set bedtime and wake time. May be in bed by 7:30 PM, wakeup time around 6 AM perhaps. She has seen Dr. Halford Chessman and was supposed to have a sleep study in the past but decided not to pursue it. She is still reluctant to come back for  sleep study as she does not want anybody watching her sleep and she does not think she would be able to sleep. Nevertheless, she will think about it. She is congested currently in says she has a head cold. She has no history of restless leg symptoms. She has no family history of OSA. Mother died of cervical cancer and father died of prostate cancer. She is one of a total of 8 kids, none of her 7 siblings have OSA. She has one daughter, age 15, who lives locally. She has no grandchildren. She does not work. 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 She is a nonsmoker and does not drink alcohol. She drinks several sodas per day. She lives alone.   Her Past Medical History Is Significant For: Past Medical History:  Diagnosis Date  . Anxiety   . Arthritis   . Asthma   . Coronary atherosclerosis of native coronary artery    Mild nonobstructive CAD by cardiac catheterization 2008 - Dr. Terrence Dupont  . Degenerative disc disease   . Depression   . Dysrhythmia   . Essential hypertension, benign   . Folliculitis 0/94/7096  . GERD (gastroesophageal reflux disease)   . H/O hiatal hernia   . Helicobacter pylori gastritis 2008  . Hypercholesterolemia   . Hypertension   . Hyperthyroidism    s/p radiation  . Low back pain   . Migraine   . Nephrolithiasis    Recurring episodes since 2004  . Severe obesity (BMI >= 40) (HCC)   . Sleep apnea    STOP BANG SCORE 6no cpap used  . Type 2 diabetes mellitus with diabetic neuropathy (HCC)     Her Past Surgical History Is Significant For: Past Surgical History:  Procedure Laterality Date  . ABDOMINAL HYSTERECTOMY    . BACK SURGERY  1993  . BACK SURGERY  1994  . BACK SURGERY  2002  . BACK SURGERY  2011   [place] Left   . CHOLECYSTECTOMY  1996  . COLONOSCOPY  2000 [facility]   INT HEMORRHOIDS/FISSURE  . COLONOSCOPY  2003 [facility], CHANGE IN BOWEL HABITS   INT HEMORRHOIDS  .  COLONOSCOPY  2006 [facility]   INT HEMORRHOIDS  . COLONOSCOPY  2007 [facility]   INT HEMORRHOIDS  . CYSTOSCOPY WITH STENT PLACEMENT Left 07/25/2012   Procedure: CYSTOSCOPY, left retograde pyelogram WITH left ureteral  STENT PLACEMENT;  Surgeon: Claybon Jabs, MD;  Location: [facility];  Service: Urology;  Laterality: Left;  . CYSTOSCOPY/RETROGRADE/URETEROSCOPY  12/04/2011   Procedure: CYSTOSCOPY/RETROGRADE/URETEROSCOPY;  Surgeon: Molli Hazard, MD;  Location: [facility];  Service: Urology;  Laterality: Right;  . CYSTOSCOPY/RETROGRADE/URETEROSCOPY/STONE EXTRACTION WITH BASKET Left 09/03/2012   Procedure: CYSTOSCOPY/RETROGRADE pyelogram/digital URETEROSCOPY/STONE EXTRACTION WITH BASKET, left stent removal;  Surgeon: Molli Hazard, MD;  Location: [facility];  Service: Urology;  Laterality: Left;  . ESOPHAGOGASTRODUODENOSCOPY  2008   GEZ:MOQHU hiatal hernia./Normal  esophagus without evidence of Barrett's mass, stricture, erosion or ulceration./Normal duodenal bulb and second portion of the duodenum./Diffuse erythema in the body and the antrum with occasional erosion.  Biopsies obtained via cold forceps to evaluate for H. pylori gastritis  . FRACTURE SURGERY  1999   right clavicle  . HOLMIUM LASER APPLICATION Left 11/29/8297   Procedure: HOLMIUM LASER APPLICATION;  Surgeon: Molli Hazard, MD;  Location: [facility];  Service: Urology;  Laterality: Left;  . KNEE ARTHROSCOPY  10/04/2010,    right knee arthroscopy, dr Theda Sers  . left knee arthroscopic surgery  1999  . Left salphingectomy secondary to ectopic pregnancy  1991  . PARTIAL HYSTERECTOMY  1991  . rotary cuff  Right 05/14/2014   [facility]  . UPPER GASTROINTESTINAL ENDOSCOPY  2008 abd pain   H. pylori gastritis  . UPPER GASTROINTESTINAL ENDOSCOPY  1996 [facility] [place]   [facility]    Her Family History Is Significant For: Family History  Problem Relation Age of Onset  . Heart disease Mother   . Hypertension Mother   . Cancer Mother        Cervical    . Asthma Mother   . Heart attack Father   . Cancer Father        Prostate  . Colon cancer Father        DECEASED AGE 1  . Colon polyps Neg Hx     Her Social History Is Significant For: Social History   Socioeconomic History  . Marital status: Single    Spouse name: Not on file  . Number of children: 1  . Years of education: 66  . Highest education level: Not on file  Occupational History  . Occupation: Disable   Social Needs  . Financial resource strain: Not on file  . Food insecurity:    Worry: Not on file    Inability: Not on file  . Transportation needs:    Medical: Not on file    Non-medical: Not on file  Tobacco Use  . Smoking status: Never Smoker  . Smokeless tobacco: Never Used  Substance and Sexual Activity  . Alcohol use: No  . Drug use: No  . Sexual activity: Not Currently    Partners: Male    Birth control/protection: Surgical    Comment: hyst  Lifestyle  . Physical activity:    Days per week: Not on file    Minutes per session: Not on file  . Stress: Not on file  Relationships  . Social connections:    Talks on phone: Not on file    Gets together: Not on file    Attends religious service: Not on file    Active member of club or organization: Not on file    Attends meetings of clubs or organizations: Not on file    Relationship status: Not on file  Other Topics Concern  . Not on file  Social History Narrative   Drinks soda daily     Her Allergies Are:  Allergies  Allergen Reactions  . Morphine Other (See Comments)    Reaction with esophagus, unable to swallow.   . Ace Inhibitors Cough  . Aspirin Other (See Comments)    Reaction with esophagus, unable to swallow.   . Losartan Cough  . Tapentadol Other (See Comments)    Nausea, increased sleepiness, h/a  . Tomato Other (See Comments)    Acid reflux due to acid in tomato  . Adhesive [Tape] Rash  :   Her  Current Medications Are:  Outpatient Encounter Medications as of 11/14/2017   Medication Sig  . amLODipine (NORVASC) 10 MG tablet Take 1 tablet (10 mg total) by mouth daily.  Marland Kitchen azelastine (ASTELIN) 0.1 % nasal spray Place 2 sprays into both nostrils 2 (two) times daily. Use in each nostril as directed  . cloNIDine (CATAPRES - DOSED IN MG/24 HR) 0.3 mg/24hr patch Place 1 patch (0.3 mg total) onto the skin once a week.  . cloNIDine (CATAPRES) 0.3 MG tablet TAKE ONE TABLET AT 9 PM EVERY NIGHT FOR HIGH BLOOD PRESSURE  . diazepam (VALIUM) 5 MG tablet One tablet three times daily for anxiety and muscle spasm  . DULoxetine (CYMBALTA) 60 MG capsule TAKE 1 CAPSULE BY MOUTH TWICE A DAY  . ergocalciferol (VITAMIN D2) 50000 units capsule Take 1 capsule (50,000 Units total) by mouth once a week. One capsule once weekly  . esomeprazole (NEXIUM) 40 MG capsule TAKE 1 CAPSULE (40 MG TOTAL) BY MOUTH DAILY.  . fluconazole (DIFLUCAN) 150 MG tablet ONE TABLET ONCE DAILY FOR 2 DAYS  . fluticasone (FLONASE) 50 MCG/ACT nasal spray Place 2 sprays into both nostrils daily. (Patient taking differently: Place 2 sprays into both nostrils as needed. )  . furosemide (LASIX) 40 MG tablet TAKE 1 TABLET BY MOUTH TWICE A DAY  . gabapentin (NEURONTIN) 800 MG tablet TAKE 1 TABLET BY MOUTH 4 TIMES A DAY  . hydrocortisone (ANUSOL-HC) 2.5 % rectal cream Place 1 application rectally 4 (four) times daily. FOR 12 DAYS  . ipratropium-albuterol (DUONEB) 0.5-2.5 (3) MG/3ML SOLN Take 3 mLs by nebulization every 6 (six) hours as needed.  Marland Kitchen KLOR-CON M20 20 MEQ tablet TAKE 1 TABLET (20 MEQ TOTAL) BY MOUTH 2 (TWO) TIMES DAILY.  Marland Kitchen loratadine (CLARITIN) 10 MG tablet Take 1 tablet (10 mg total) by mouth daily.  . metFORMIN (GLUCOPHAGE) 1000 MG tablet TAKE 1 TABLET BY MOUTH TWICE A DAY WITH FOOD  . montelukast (SINGULAIR) 10 MG tablet TAKE 1 TABLET (10 MG TOTAL) BY MOUTH AT BEDTIME.  Marland Kitchen NITROSTAT 0.4 MG SL tablet PLACE 1 TABLET UNDER TONGUE IF NEEDED FOR CHEST PAIN..MAX OF 3 TIMES  . oxyCODONE (OXYCONTIN) 40 mg 12 hr tablet  Take 1 tablet (40 mg total) by mouth every 12 (twelve) hours.  Marland Kitchen oxyCODONE (OXYCONTIN) 40 mg 12 hr tablet Take one tablet two times daily for back pain  . [START ON 12/07/2017] oxyCODONE (OXYCONTIN) 40 mg 12 hr tablet Take one tablet by mouth every 12 hours for back pain  . [START ON 01/06/2018] oxyCODONE (OXYCONTIN) 40 mg 12 hr tablet Take one tablet by mouth every 12 hours for back pain  . PROAIR HFA 108 (90 BASE) MCG/ACT inhaler INHALE 2 PUFFS BY MOUTH EVERY 6-8HRS AS NEEDED  . rosuvastatin (CRESTOR) 40 MG tablet Take 1 tablet (40 mg total) by mouth daily.  . SYMBICORT 160-4.5 MCG/ACT inhaler INHALE 2 PUUFS BY MOUTH TWICE DAILY  . tiZANidine (ZANAFLEX) 2 MG tablet 1 tablet twice a day as needed for pain  . triamcinolone cream (KENALOG) 0.1 % Apply 1 application topically 2 (two) times daily.  Marland Kitchen triamterene-hydrochlorothiazide (MAXZIDE) 75-50 MG tablet TAKE 1 TABLET BY MOUTH DAILY.  Marland Kitchen zolpidem (AMBIEN) 10 MG tablet TAKE 1 TABLET BY MOUTH AT BEDTIME  . [DISCONTINUED] losartan-hydrochlorothiazide (HYZAAR) 50-12.5 MG per tablet TAKE 1 TABLET BY MOUTH ONCE DAILY *REPLACES DIOVAN/HCTZ*   No facility-administered encounter medications on file as of 11/14/2017.   :  Review of Systems:  Out of a complete 14  point review of systems, all are reviewed and negative with the exception of these symptoms as listed below: Review of Systems  Neurological:       Pt presents today to follow up on her cpap. Pt reports that her cpap is going well. She took it on her vacation recently.    Objective:  Neurological Exam  Physical Exam Physical Examination:   Vitals:   11/14/17 1026  BP: (!) 157/94  Pulse: 88    General Examination: The patient is a very pleasant 57 y.o. female in no acute distress. She appears well-developed and well-nourished and well groomed.   HEENT:Normocephalic, atraumatic, pupils are equal, round and reactive to light and accommodation. Extraocular tracking is good without limitation  to gaze excursion or nystagmus noted. Normal smooth pursuit is noted. Hearing is grossly intact. Face is symmetric with normal facial animation and normal facial sensation. Speech is clear with no dysarthria noted. There is no hypophonia. There is no lip, neck/head, jaw or voice tremor. Neck is supple with full range of passive and active motion. There are no carotid bruits on auscultation. Oropharynx exam reveals: mildto moderate mouth dryness, adequatedental hygiene and markedairway crowding. Tongue protrudes centrally and palate elevates symmetrically.   Chest:Clear to auscultation without wheezing, rhonchi or crackles noted.  Heart:S1+S2+0, regular and normal without murmurs, rubs or gallops noted.   Abdomen:Soft, non-tender and non-distended with normal bowel sounds appreciated on auscultation.  Extremities:There is nopitting edema in the distal lower extremities bilaterally.  Skin: Warm and dry with scarring and significant hyperpigmentation noted in both shin areas and slightly over both elbows.    Musculoskeletal: exam reveals LBP, and bilateral knee pain, L wrist pain.  Neurologically:  Mental status: The patient is awake, alert and oriented in all 4 spheres. Herimmediate and remote memory, attention, language skills and fund of knowledge are appropriate. There is no evidence of aphasia, agnosia, apraxia or anomia. Speech is clear with normal prosody and enunciation. Thought process is linear. Mood is normaland affect is normal.  Cranial nerves II - XII are as described above under HEENT exam. Motor exam: Normal bulk, strength and tone is noted. There is no tremor. Romberg is not tested for safety reasons. Fine motor skills and coordination: intact with normal finger taps, normal hand movements, normal rapid alternating patting, normal foot taps and normal foot agility.  Cerebellar testing: No dysmetria or intention tremor. Sensory exam: intact to light touch.  Gait,  station and balance: Shestands with difficulty, walks with a limp, slight waddle.   Assessment and Plan:  In summary, Angela [name] a very pleasant 57 year old female with an underlying medical history of recurrent vertigo, allergic rhinitis, asthma, hypertension, arthritis, s/p b/l knee injections, R shoulder surgery, chronic LBP with s/p 4 back surgeries, on chronic narcotic pain medication, and morbid obesity, who presents for follow-up consultation of her severe obstructive sleep apnea, well established on CPAP therapy. She had a split-night sleep study in October 2017. We reviewed her most recent compliance data. She is commended for her compliance but also encouraged to not skip any nights. She is working on weight loss. She is in close follow-up with her primary care physician. From a sleep apnea standpoint she has done well and is commended for her treatment adherence and weight loss endeavors. I suggested a one-year checkup with one of our nurse practitioners. I answered all her questions today and she was in agreement.  I spent 25 minutes in total face-to-face time with the patient, more  than 50% of which was spent in counseling and coordination of care, reviewing test results, reviewing medication and discussing or reviewing the diagnosis of OSA, its prognosis and treatment options. Pertinent laboratory and imaging test results that were available during this visit with the patient were reviewed by me and considered in my medical decision making (see chart for details).

## 2017-11-28 ENCOUNTER — Other Ambulatory Visit: Payer: Self-pay | Admitting: Family Medicine

## 2017-12-19 ENCOUNTER — Other Ambulatory Visit: Payer: Self-pay | Admitting: Family Medicine

## 2017-12-28 ENCOUNTER — Other Ambulatory Visit: Payer: Self-pay | Admitting: Family Medicine

## 2017-12-29 ENCOUNTER — Other Ambulatory Visit: Payer: Self-pay | Admitting: Family Medicine

## 2018-01-09 ENCOUNTER — Other Ambulatory Visit: Payer: Self-pay | Admitting: Family Medicine

## 2018-01-09 ENCOUNTER — Other Ambulatory Visit: Payer: Self-pay

## 2018-01-09 DIAGNOSIS — M62830 Muscle spasm of back: Secondary | ICD-10-CM

## 2018-01-09 MED ORDER — ALBUTEROL SULFATE HFA 108 (90 BASE) MCG/ACT IN AERS: INHALATION_SPRAY | RESPIRATORY_TRACT | 1 refills | Status: DC

## 2018-01-10 ENCOUNTER — Other Ambulatory Visit: Payer: Self-pay

## 2018-01-10 MED ORDER — ZOLPIDEM TARTRATE 10 MG PO TABS: ORAL_TABLET | ORAL | 1 refills | Status: DC

## 2018-01-11 ENCOUNTER — Other Ambulatory Visit: Payer: Self-pay | Admitting: Family Medicine

## 2018-01-14 ENCOUNTER — Other Ambulatory Visit: Payer: Self-pay | Admitting: Family Medicine

## 2018-01-30 ENCOUNTER — Telehealth: Payer: Self-pay | Admitting: Family Medicine

## 2018-01-30 DIAGNOSIS — E785 Hyperlipidemia, unspecified: Secondary | ICD-10-CM

## 2018-01-30 DIAGNOSIS — E119 Type 2 diabetes mellitus without complications: Secondary | ICD-10-CM

## 2018-01-30 NOTE — Telephone Encounter (Signed)
Pt has appt on the 10-10--Per last AVS --needs lab work

## 2018-01-31 NOTE — Telephone Encounter (Signed)
Called pt and left message that labs were ordered for [facility] and they were fasting

## 2018-01-31 NOTE — Addendum Note (Signed)
Addended by: Eual Fines on: 01/31/2018 01:04 PM   Modules accepted: Orders

## 2018-01-31 NOTE — Telephone Encounter (Signed)
First 3 only , you are correct, thank you, lipid, cmp and eGFr and hBA1C fasting

## 2018-01-31 NOTE — Telephone Encounter (Signed)
cmp egfr, lipid, a1c, (tsh, cbc, vit d?) what labs would you like. Are these ok?

## 2018-02-05 ENCOUNTER — Other Ambulatory Visit: Payer: Self-pay | Admitting: Family Medicine

## 2018-02-06 DIAGNOSIS — E785 Hyperlipidemia, unspecified: Secondary | ICD-10-CM | POA: Diagnosis not present

## 2018-02-06 DIAGNOSIS — E119 Type 2 diabetes mellitus without complications: Secondary | ICD-10-CM | POA: Diagnosis not present

## 2018-02-07 ENCOUNTER — Ambulatory Visit (INDEPENDENT_AMBULATORY_CARE_PROVIDER_SITE_OTHER): Payer: Medicare Other | Admitting: Family Medicine

## 2018-02-07 ENCOUNTER — Encounter: Payer: Self-pay | Admitting: Family Medicine

## 2018-02-07 VITALS — BP 138/82 | HR 72 | Resp 12 | Ht 64.0 in | Wt 314.0 lb

## 2018-02-07 DIAGNOSIS — M5090 Cervical disc disorder, unspecified, unspecified cervical region: Secondary | ICD-10-CM | POA: Diagnosis not present

## 2018-02-07 DIAGNOSIS — E119 Type 2 diabetes mellitus without complications: Secondary | ICD-10-CM

## 2018-02-07 DIAGNOSIS — Z5309 Procedure and treatment not carried out because of other contraindication: Secondary | ICD-10-CM

## 2018-02-07 DIAGNOSIS — R7401 Elevation of levels of liver transaminase levels: Secondary | ICD-10-CM

## 2018-02-07 DIAGNOSIS — Z23 Encounter for immunization: Secondary | ICD-10-CM

## 2018-02-07 DIAGNOSIS — E1169 Type 2 diabetes mellitus with other specified complication: Secondary | ICD-10-CM | POA: Diagnosis not present

## 2018-02-07 DIAGNOSIS — R740 Nonspecific elevation of levels of transaminase and lactic acid dehydrogenase [LDH]: Secondary | ICD-10-CM

## 2018-02-07 DIAGNOSIS — R52 Pain, unspecified: Secondary | ICD-10-CM | POA: Diagnosis not present

## 2018-02-07 DIAGNOSIS — I10 Essential (primary) hypertension: Secondary | ICD-10-CM | POA: Diagnosis not present

## 2018-02-07 DIAGNOSIS — F5101 Primary insomnia: Secondary | ICD-10-CM

## 2018-02-07 DIAGNOSIS — E785 Hyperlipidemia, unspecified: Secondary | ICD-10-CM | POA: Diagnosis not present

## 2018-02-07 DIAGNOSIS — E669 Obesity, unspecified: Secondary | ICD-10-CM

## 2018-02-07 LAB — COMPLETE METABOLIC PANEL WITH GFR
AG Ratio: 1.3 (calc) (ref 1.0–2.5)
ALBUMIN MSPROF: 4.1 g/dL (ref 3.6–5.1)
ALKALINE PHOSPHATASE (APISO): 78 U/L (ref 33–130)
ALT: 53 U/L — ABNORMAL HIGH (ref 6–29)
AST: 61 U/L — AB (ref 10–35)
BILIRUBIN TOTAL: 0.4 mg/dL (ref 0.2–1.2)
BUN: 17 mg/dL (ref 7–25)
CHLORIDE: 102 mmol/L (ref 98–110)
CO2: 30 mmol/L (ref 20–32)
Calcium: 9.4 mg/dL (ref 8.6–10.4)
Creat: 0.8 mg/dL (ref 0.50–1.05)
GFR, Est African American: 95 mL/min/{1.73_m2} (ref >=60)
GFR, Est Non African American: 82 mL/min/{1.73_m2} (ref >=60)
GLOBULIN: 3.1 g/dL (ref 1.9–3.7)
GLUCOSE: 183 mg/dL — AB (ref 65–99)
Potassium: 4.2 mmol/L (ref 3.5–5.3)
SODIUM: 140 mmol/L (ref 135–146)
Total Protein: 7.2 g/dL (ref 6.1–8.1)

## 2018-02-07 LAB — LIPID PANEL
CHOLESTEROL: 114 mg/dL (ref <200)
HDL: 44 mg/dL — ABNORMAL LOW (ref >50)
LDL Cholesterol (Calc): 54 mg/dL (calc)
Non-HDL Cholesterol (Calc): 70 mg/dL (calc) (ref <130)
Total CHOL/HDL Ratio: 2.6 (calc) (ref <5.0)
Triglycerides: 76 mg/dL (ref <150)

## 2018-02-07 LAB — HEMOGLOBIN A1C
EAG (MMOL/L): 9.2 (calc)
Hgb A1c MFr Bld: 7.4 % of total Hgb — ABNORMAL HIGH (ref <5.7)
MEAN PLASMA GLUCOSE: 166 (calc)

## 2018-02-07 MED ORDER — OXYCODONE HCL ER 40 MG PO T12A: 40.0000 mg | EXTENDED_RELEASE_TABLET | Freq: Two times a day (BID) | ORAL | 0 refills | Status: DC

## 2018-02-07 MED ORDER — OXYCODONE HCL ER 40 MG PO T12A: 40.0000 mg | EXTENDED_RELEASE_TABLET | Freq: Two times a day (BID) | ORAL | 0 refills | Status: AC

## 2018-02-07 NOTE — Progress Notes (Signed)
Fill Date ID Written Drug Qty Days Prescriber Rx # Pharmacy Refill Daily Dose * Pymt Type PMP  01/11/2018  1  01/11/2018  Zolpidem Tartrate 10 Mg Tablet  30.00 30 Ma Sim  [id number]  [place] ([id number])  0/1 0.50 LME Medicare  [place]  01/11/2018  1  10/08/2017  Diazepam 5 Mg Tablet  90.00 30 Ma Sim  [id number]  [place] ([id number])  2/5 1.50 LME Medicare  [place]  01/06/2018  1  10/14/2017  Oxycontin Er 40 Mg Tablet  60.00 30 Ma Sim  [id number]  [place] ([id number])  0/0 120.00 MME Medicare  [place]  12/11/2017  1  10/08/2017  Diazepam 5 Mg Tablet  90.00 30 Ma Sim  [id number]  [place] ([id number])  1/5 1.50 LME Medicare  [place]  12/07/2017  1  10/14/2017  Oxycontin Er 40 Mg Tablet  60.00 30 Ma Sim  [id number]  [place] ([id number])  0/0 120.00 MME Medicare  [place]  12/05/2017  1  11/05/2017  Zolpidem Tartrate 10 Mg Tablet  30.00 30 Ma Sim  [id number]  [place] ([id number])  1/1 0.50 LME Medicare  [place]  11/12/2017  1  10/08/2017  Diazepam 5 Mg Tablet  90.00 30 Ma Sim  [id number]  [place] ([id number])  0/5 1.50 LME Medicare  [place]  11/06/2017  1  10/14/2017  Oxycontin Er 40 Mg Tablet  60.00 30 Ma Sim  [id number]  [place] ([id number])  0/0 120.00 MME Medicare  [place]  11/05/2017  1  11/05/2017  Zolpidem Tartrate 10 Mg Tablet  30.00 30 Ma Sim  [id number]  [place] ([id number])  0/1 0.50 LME Medicare  [place]  10/09/2017  1  09/07/2017  Diazepam 5 Mg Tablet  90.00 30 Ma Sim  [id number]  [place] ([id number])  1/1 1.50 LME Medicare  [place]  10/07/2017  1  07/05/2017  Oxycontin Er 40 Mg Tablet  60.00 30 Ma Sim  [id number]  [place] ([id number])  0/0 120.00 MME Medicare  [place]  10/03/2017  1  08/24/2017  Zolpidem Tartrate 10 Mg Tablet  30.00 30 Ma Sim  [id number]  [place] ([id number])  1/1 0.50 LME Medicare  [place]  09/07/2017  1  09/07/2017  Diazepam 5 Mg Tablet  90.00 30 Ma Sim  [id number]  [place] ([id number])  0/1 1.50 LME Medicare    09/07/2017  1  09/04/2017  Oxycontin Er 40 Mg Tablet

## 2018-02-07 NOTE — Patient Instructions (Addendum)
F/U in 12 weeks, call if you need me before  Flu vaccine today  Stop medication for cholesterol,crestor, liver enzymes have increased  You are referred to diabetic educator for help withh blood sugar, cholesterol and weight   Medications sent in  Fasting lipid, cmp and EGFR, HBA1C 1 week before next visit    All the best Thank you  for choosing [facility]. We consider it a privelige to serve you.  Delivering excellent health care in a caring and  compassionate way is our goal.  Partnering with you,  so that together we can achieve this goal is our strategy.

## 2018-02-09 LAB — PMP SCREEN PROFILE (10S), URINE
Amphetamine Scrn, Ur: NEGATIVE ng/mL
BARBITURATE SCREEN URINE: NEGATIVE ng/mL
BENZODIAZEPINE SCREEN, URINE: NEGATIVE ng/mL
CANNABINOIDS UR QL SCN: NEGATIVE ng/mL
Cocaine (Metab) Scrn, Ur: NEGATIVE ng/mL
Creatinine(Crt), U: 159.8 mg/dL (ref 20.0–300.0)
METHADONE SCREEN, URINE: NEGATIVE ng/mL
OPIATE SCREEN URINE: POSITIVE ng/mL — AB
OXYCODONE+OXYMORPHONE UR QL SCN: POSITIVE ng/mL — AB
Ph of Urine: 6.1 (ref 4.5–8.9)
Phencyclidine Qn, Ur: NEGATIVE ng/mL
Propoxyphene Scrn, Ur: NEGATIVE ng/mL

## 2018-02-09 LAB — SPECIMEN STATUS REPORT

## 2018-02-12 ENCOUNTER — Ambulatory Visit: Payer: Self-pay | Admitting: Nutrition

## 2018-02-12 ENCOUNTER — Telehealth: Payer: Self-pay | Admitting: Nutrition

## 2018-02-12 NOTE — Telephone Encounter (Signed)
VM to call and rescheduled missed appt.

## 2018-02-25 ENCOUNTER — Other Ambulatory Visit: Payer: Self-pay | Admitting: Family Medicine

## 2018-02-25 MED ORDER — BUDESONIDE-FORMOTEROL FUMARATE 160-4.5 MCG/ACT IN AERO: 2.0000 | INHALATION_SPRAY | Freq: Two times a day (BID) | RESPIRATORY_TRACT | 3 refills | Status: DC

## 2018-02-25 NOTE — Progress Notes (Signed)
symbicort 160 

## 2018-03-09 DIAGNOSIS — R740 Nonspecific elevation of levels of transaminase and lactic acid dehydrogenase [LDH]: Secondary | ICD-10-CM

## 2018-03-09 DIAGNOSIS — R7401 Elevation of levels of liver transaminase levels: Secondary | ICD-10-CM | POA: Insufficient documentation

## 2018-03-09 DIAGNOSIS — Z5309 Procedure and treatment not carried out because of other contraindication: Secondary | ICD-10-CM | POA: Insufficient documentation

## 2018-03-09 MED ORDER — ZOLPIDEM TARTRATE 10 MG PO TABS: 10.0000 mg | ORAL_TABLET | Freq: Every day | ORAL | 5 refills | Status: DC

## 2018-03-09 NOTE — Assessment & Plan Note (Signed)
Worsened , with increased weight gain. Stop statin and work aggressively on weight loss , refer to educator

## 2018-03-09 NOTE — Progress Notes (Signed)
Angela [name]     MRN: [id number]      DOB: August 16, 1960   HPI Angela [name] is here for follow up and re-evaluation of chronic medical conditions, medication management and review of any available recent lab and radiology data.  Preventive health is updated, specifically  Cancer screening and Immunization.   Questions or concerns regarding consultations or procedures which the PT has had in the interim are  addressed. The PT denies any adverse reactions to current medications since the last visit.  There are no new concerns.  There are no specific complaints   ROS Denies recent fever or chills. Denies sinus pressure, nasal congestion, ear pain or sore throat. Denies chest congestion, productive cough or wheezing. Denies chest pains, palpitations and leg swelling Denies abdominal pain, nausea, vomiting,diarrhea or constipation.   Denies dysuria, frequency, hesitancy or incontinence. Denies joint pain, swelling and limitation in mobility. Denies headaches, seizures, numbness, or tingling. Denies depression, anxiety or insomnia. Denies skin break down or rash.   PE  BP 138/82   Pulse 72   Resp 12   Ht 5\' 4"  (1.626 m)   Wt (!) 314 lb 0.6 oz (142.4 kg)   SpO2 99% Comment: room air  BMI 53.90 kg/m   Patient alert and oriented and in no cardiopulmonary distress.  HEENT: No facial asymmetry, EOMI,   oropharynx pink and moist.  Neck supple no JVD, no mass.  Chest: Clear to auscultation bilaterally.  CVS: S1, S2 no murmurs, no S3.Regular rate.  ABD: Soft non tender.   Ext: No edema  MS: Adequate ROM spine, shoulders, hips and knees.  Skin: Intact, no ulcerations or rash noted.  Psych: Good eye contact, normal affect. Memory intact not anxious or depressed appearing.  CNS: CN 2-12 intact, power,  normal throughout.no focal deficits noted.   Assessment & Plan  Essential hypertension Controlled, no change in medication DASH diet and commitment to daily physical activity  for a minimum of 30 minutes discussed and encouraged, as a part of hypertension management. The importance of attaining a healthy weight is also discussed.  BP/Weight 02/07/2018 11/14/2017 10/08/2017 08/15/2017 07/24/2017 07/05/2017 53/61/4431  Systolic BP 540 086 761 950 932 671 245  Diastolic BP 82 94 90 86 73 84 90  Wt. (Lbs) 314.04 307 305 299 315 321 304.08  BMI 53.9 52.7 52.35 51.32 54.07 55.1 52.2       Encounter for pain management The patient's Controlled Substance registry is reviewed and compliance confirmed. Adequacy of  Pain control and level of function is assessed. Medication dosing is adjusted as deemed appropriate. Twelve weeks of medication is prescribed , patient signs for the script and is provided with a follow up appointment between 11 to 12 weeks .   Morbid obesity Deteriorated. Patient re-educated about  the importance of commitment to a  minimum of 150 minutes of exercise per week.  The importance of healthy food choices with portion control discussed. Encouraged to start a food diary, count calories and to consider  joining a support group. Sample diet sheets offered. Goals set by the patient for the next several months.   Weight /BMI 02/07/2018 11/14/2017 10/08/2017  WEIGHT 314 lb 0.6 oz 307 lb 305 lb  HEIGHT 5\' 4"  5\' 4"  5\' 4"   BMI 53.9 kg/m2 52.7 kg/m2 52.35 kg/m2      Hyperlipidemia LDL goal <70 Hyperlipidemia:Low fat diet discussed and encouraged.   Lipid Panel  Lab Results  Component Value Date   CHOL 114 02/06/2018  HDL 44 (L) 02/06/2018   LDLCALC 54 02/06/2018   TRIG 76 02/06/2018   CHOLHDL 2.6 02/06/2018  controlled but has NASH with increase in LFT, so needs to d/c statin     Transaminitis Worsened , with increased weight gain. Stop statin and work aggressively on weight loss , refer to educator  Contraindication to statin medication Pt advised to stop statin at this time  Diabetes mellitus without complication Angela [name] is  reminded of the importance of commitment to daily physical activity for 30 minutes or more, as able and the need to limit carbohydrate intake to 30 to 60 grams per meal to help with blood sugar control.   The need to take medication as prescribed, test blood sugar as directed, and to call between visits if there is a concern that blood sugar is uncontrolled is also discussed.   Angela [name] is reminded of the importance of daily foot exam, annual eye examination, and good blood sugar, blood pressure and cholesterol control.  Diabetic Labs Latest Ref Rng & Units 02/06/2018 10/08/2017 07/24/2017 07/06/2017 01/25/2017  HbA1c <5.7 % of total Hgb 7.4(H) 7.2(H) - - 6.8(H)  Microalbumin mg/dL - 2.9 - 33.7(H) -  Micro/Creat Ratio <30 mcg/mg creat - 20 - 107.3(H) -  Chol <200 mg/dL 114 181 - - 151  HDL >50 mg/dL 44(L) 47(L) - - 46(L)  Calc LDL mg/dL (calc) 54 115(H) - - 87  Triglycerides <150 mg/dL 76 90 - - 90  Creatinine 0.50 - 1.05 mg/dL 0.80 0.80 0.94 - 0.71   BP/Weight 02/07/2018 11/14/2017 10/08/2017 08/15/2017 07/24/2017 07/05/2017 40/01/2724  Systolic BP 366 440 347 425 956 387 564  Diastolic BP 82 94 90 86 73 84 90  Wt. (Lbs) 314.04 307 305 299 315 321 304.08  BMI 53.9 52.7 52.35 51.32 54.07 55.1 52.2   Foot/eye exam completion dates Latest Ref Rng & Units 02/07/2018 11/06/2016  Eye Exam No Retinopathy - -  Foot Form Completion - Done Done   No med change, however HBa1C is increasing as is her weight and has elevated LFT , refer to diabetic educator

## 2018-03-09 NOTE — Assessment & Plan Note (Signed)
Pt advised to stop statin at this time

## 2018-03-09 NOTE — Assessment & Plan Note (Signed)
The patient's Controlled Substance registry is reviewed and compliance confirmed. Adequacy of  Pain control and level of function is assessed. Medication dosing is adjusted as deemed appropriate. Twelve weeks of medication is prescribed , patient signs for the script and is provided with a follow up appointment between 11 to 12 weeks .  

## 2018-03-09 NOTE — Assessment & Plan Note (Signed)
Angela [name] is reminded of the importance of commitment to daily physical activity for 30 minutes or more, as able and the need to limit carbohydrate intake to 30 to 60 grams per meal to help with blood sugar control.   The need to take medication as prescribed, test blood sugar as directed, and to call between visits if there is a concern that blood sugar is uncontrolled is also discussed.   Angela [name] is reminded of the importance of daily foot exam, annual eye examination, and good blood sugar, blood pressure and cholesterol control.  Diabetic Labs Latest Ref Rng & Units 02/06/2018 10/08/2017 07/24/2017 07/06/2017 01/25/2017  HbA1c <5.7 % of total Hgb 7.4(H) 7.2(H) - - 6.8(H)  Microalbumin mg/dL - 2.9 - 33.7(H) -  Micro/Creat Ratio <30 mcg/mg creat - 20 - 107.3(H) -  Chol <200 mg/dL 114 181 - - 151  HDL >50 mg/dL 44(L) 47(L) - - 46(L)  Calc LDL mg/dL (calc) 54 115(H) - - 87  Triglycerides <150 mg/dL 76 90 - - 90  Creatinine 0.50 - 1.05 mg/dL 0.80 0.80 0.94 - 0.71   BP/Weight 02/07/2018 11/14/2017 10/08/2017 08/15/2017 07/24/2017 07/05/2017 71/69/6789  Systolic BP 381 017 510 258 527 782 423  Diastolic BP 82 94 90 86 73 84 90  Wt. (Lbs) 314.04 307 305 299 315 321 304.08  BMI 53.9 52.7 52.35 51.32 54.07 55.1 52.2   Foot/eye exam completion dates Latest Ref Rng & Units 02/07/2018 11/06/2016  Eye Exam No Retinopathy - -  Foot Form Completion - Done Done   No med change, however HBa1C is increasing as is her weight and has elevated LFT , refer to diabetic educator

## 2018-03-09 NOTE — Assessment & Plan Note (Signed)
Controlled, no change in medication DASH diet and commitment to daily physical activity for a minimum of 30 minutes discussed and encouraged, as a part of hypertension management. The importance of attaining a healthy weight is also discussed.  BP/Weight 02/07/2018 11/14/2017 10/08/2017 08/15/2017 07/24/2017 07/05/2017 04/59/9774  Systolic BP 142 395 320 233 435 686 168  Diastolic BP 82 94 90 86 73 84 90  Wt. (Lbs) 314.04 307 305 299 315 321 304.08  BMI 53.9 52.7 52.35 51.32 54.07 55.1 52.2

## 2018-03-09 NOTE — Assessment & Plan Note (Signed)
Hyperlipidemia:Low fat diet discussed and encouraged.   Lipid Panel  Lab Results  Component Value Date   CHOL 114 02/06/2018   HDL 44 (L) 02/06/2018   LDLCALC 54 02/06/2018   TRIG 76 02/06/2018   CHOLHDL 2.6 02/06/2018  controlled but has NASH with increase in LFT, so needs to d/c statin

## 2018-03-09 NOTE — Assessment & Plan Note (Signed)
Deteriorated. Patient re-educated about  the importance of commitment to a  minimum of 150 minutes of exercise per week.  The importance of healthy food choices with portion control discussed. Encouraged to start a food diary, count calories and to consider  joining a support group. Sample diet sheets offered. Goals set by the patient for the next several months.   Weight /BMI 02/07/2018 11/14/2017 10/08/2017  WEIGHT 314 lb 0.6 oz 307 lb 305 lb  HEIGHT 5\' 4"  5\' 4"  5\' 4"   BMI 53.9 kg/m2 52.7 kg/m2 52.35 kg/m2

## 2018-03-13 ENCOUNTER — Ambulatory Visit: Payer: Self-pay

## 2018-03-19 ENCOUNTER — Ambulatory Visit: Payer: Self-pay

## 2018-03-21 DIAGNOSIS — G4733 Obstructive sleep apnea (adult) (pediatric): Secondary | ICD-10-CM | POA: Diagnosis not present

## 2018-03-21 DIAGNOSIS — K635 Polyp of colon: Secondary | ICD-10-CM | POA: Diagnosis not present

## 2018-03-21 DIAGNOSIS — Z86010 Personal history of colonic polyps: Secondary | ICD-10-CM | POA: Diagnosis not present

## 2018-03-21 DIAGNOSIS — K625 Hemorrhage of anus and rectum: Secondary | ICD-10-CM | POA: Diagnosis not present

## 2018-04-02 ENCOUNTER — Ambulatory Visit: Payer: Self-pay

## 2018-04-03 ENCOUNTER — Encounter: Payer: Medicare Other | Attending: Family Medicine | Admitting: Nutrition

## 2018-04-03 VITALS — Ht 64.0 in | Wt 312.0 lb

## 2018-04-03 DIAGNOSIS — E1165 Type 2 diabetes mellitus with hyperglycemia: Secondary | ICD-10-CM | POA: Diagnosis not present

## 2018-04-03 DIAGNOSIS — E118 Type 2 diabetes mellitus with unspecified complications: Secondary | ICD-10-CM | POA: Insufficient documentation

## 2018-04-03 DIAGNOSIS — IMO0002 Reserved for concepts with insufficient information to code with codable children: Secondary | ICD-10-CM

## 2018-04-03 NOTE — Progress Notes (Signed)
  Medical Nutrition Therapy:  Appt start time: 5784 end time:  1650  Assessment:  Primary concerns today:  [place] Obesity and Dm Type 2. Possible fatty liver with elevated liver enzymes. BMI 53  Sees PCP Dr. Tonna Corner three meals per day and snacks.  Metformin 1000 mg BID.  Testing blood sugars twice a day: 100-104 mg/dl Bedtime 100-110 mg/dl. Physical activity limited due to her size. Takes pain medications.  Current diet is excessive in calories and carbs related to obesity and uncontrolled DM. [place] to work on changing eating habits.   Lab Results  Component Value Date   HGBA1C 7.4 (H) 02/06/2018   CMP Latest Ref Rng & Units 02/06/2018 10/08/2017 07/24/2017  Glucose 65 - 99 mg/dL 183(H) 124(H) 133(H)  BUN 7 - 25 mg/dL 17 17 18   Creatinine 0.50 - 1.05 mg/dL 0.80 0.80 0.94  Sodium 135 - 146 mmol/L 140 138 139  Potassium 3.5 - 5.3 mmol/L 4.2 4.0 3.7  Chloride 98 - 110 mmol/L 102 100 102  CO2 20 - 32 mmol/L 30 29 27   Calcium 8.6 - 10.4 mg/dL 9.4 9.4 9.8  Total Protein 6.1 - 8.1 g/dL 7.2 7.6 -  Total Bilirubin 0.2 - 1.2 mg/dL 0.4 0.3 -  Alkaline Phos 33 - 130 U/L - - -  AST 10 - 35 U/L 61(H) 41(H) -  ALT 6 - 29 U/L 53(H) 25 -   Preferred Learning Style:   No preference indicated   Learning Readiness:   Ready  Change in progress   MEDICATIONS:    DIETARY INTAKE:  24-hr recall:  B ( AM): Coffee with cream and splenda, egg and 1 slice toast. Snk ( AM): 2 apples L ( PM): ham lettuce and tomato sandwich, water Snk ( PM): peaches D ( PM):  Baked chicken, string beans, broccoli, Diet soda Snk ( PM): Popcorn or water, orange Beverages: water and diet soda  Usual physical activity: ADL  Estimated energy needs: 1200  calories 135 g carbohydrates 90 g protein 33 g fat  Progress Towards Goal(s):  In progress.   Nutritional Diagnosis:  NB-1.1 Food and nutrition-related knowledge deficit As related to Diabetes.  As evidenced by A1C 7.4%.    Intervention:  Nutrition  and Diabetes education provided on My Plate, CHO counting, meal planning, portion sizes, timing of meals, avoiding snacks between meals unless having a low blood sugar, target ranges for A1C and blood sugars, signs/symptoms and treatment of hyper/hypoglycemia, monitoring blood sugars, taking medications as prescribed, benefits of exercising 30 minutes per day and prevention of complications of DM.   Goal 1. Eat by 9 am 2. Walk 15 minutes a day 3. Increase vegetables with lunch and dinner 4. Keep drinking water only and avoid diet sodas Lose 1 lb per week   Teaching Method Utilized:  Visual Auditory Hands on  Handouts given during visit include:  The Plate Method   Meal Plan Card   Barriers to learning/adherence to lifestyle change: none  Demonstrated degree of understanding via:  Teach Back   Monitoring/Evaluation:  Dietary intake, exercise, meal planning, and body weight in 1 month(s).

## 2018-04-09 ENCOUNTER — Other Ambulatory Visit: Payer: Self-pay | Admitting: Family Medicine

## 2018-04-09 ENCOUNTER — Encounter: Payer: Self-pay | Admitting: Nutrition

## 2018-04-09 DIAGNOSIS — I10 Essential (primary) hypertension: Secondary | ICD-10-CM

## 2018-04-10 ENCOUNTER — Ambulatory Visit (INDEPENDENT_AMBULATORY_CARE_PROVIDER_SITE_OTHER): Payer: Medicare Other

## 2018-04-10 VITALS — BP 144/89 | HR 82 | Resp 12 | Ht 64.0 in | Wt 315.0 lb

## 2018-04-10 DIAGNOSIS — Z0000 Encounter for general adult medical examination without abnormal findings: Secondary | ICD-10-CM

## 2018-04-10 NOTE — Progress Notes (Signed)
Subjective:   Angela [name] is a 57 y.o. female who presents for Medicare Annual (Subsequent) preventive examination.  Review of Systems:  Cardiac Risk Factors include: hypertension;diabetes mellitus;dyslipidemia;obesity (BMI >30kg/m2);sedentary lifestyle     Objective:     Vitals: BP (!) 144/89   Pulse 82   Resp 12   Ht 5\' 4"  (1.626 m)   Wt (!) 315 lb (142.9 kg)   SpO2 97%   BMI 54.07 kg/m   Body mass index is 54.07 kg/m.  Advanced Directives 04/10/2018 07/24/2017 08/28/2016 05/03/2016 02/26/2015 01/21/2015 10/14/2014  Does Patient Have a Medical Advance Directive? No No Yes Yes No Yes Yes  Type of Advance Directive - - Public librarian;Living will - Healthcare Power of [place];Living will  Does patient want to make changes to medical advance directive? - - - No - Patient declined - - -  Copy of [place] in Chart? - - - No - copy requested - - No - copy requested  Would patient like information on creating a medical advance directive? No - Patient declined No - Patient declined - - No - patient declined information - -  Pre-existing out of facility DNR order (yellow form or pink MOST form) - - - - - - -    Tobacco Social History   Tobacco Use  Smoking Status Never Smoker  Smokeless Tobacco Never Used     Counseling given: Not Answered   Clinical Intake:  Pre-visit preparation completed: Yes  Pain : 0-10 Pain Score: 9  Pain Type: Chronic pain Pain Location: Back Pain Orientation: Lower Pain Descriptors / Indicators: Aching Pain Onset: More than a month ago Pain Frequency: Constant Pain Relieving Factors: oxycontin  Pain Relieving Factors: oxycontin  BMI - recorded: 54.1 Nutritional Status: BMI > 30  Obese Nutritional Risks: None Diabetes: Yes CBG done?: No Did pt. bring in CBG monitor from home?: No  How often do you need to have someone help you when you read instructions, pamphlets, or other  written materials from your doctor or pharmacy?: 2 - Rarely What is the last grade level you completed in school?: 12 grade   Interpreter Needed?: No  Information entered by :: Francena Hanly LPN   Past Medical History:  Diagnosis Date  . Anxiety   . Arthritis   . Asthma   . Coronary atherosclerosis of native coronary artery    Mild nonobstructive CAD by cardiac catheterization 2008 - Dr. Terrence Dupont  . Degenerative disc disease   . Depression   . Dysrhythmia   . Essential hypertension, benign   . Folliculitis 4/94/4967  . GERD (gastroesophageal reflux disease)   . H/O hiatal hernia   . Helicobacter pylori gastritis 2008  . Hypercholesterolemia   . Hypertension   . Hyperthyroidism    s/p radiation  . Low back pain   . Migraine   . Nephrolithiasis    Recurring episodes since 2004  . Severe obesity (BMI >= 40) (HCC)   . Sleep apnea    STOP BANG SCORE 6no cpap used  . Type 2 diabetes mellitus with diabetic neuropathy [facility])    Past Surgical History:  Procedure Laterality Date  . ABDOMINAL HYSTERECTOMY    . BACK SURGERY  1993  . BACK SURGERY  1994  . BACK SURGERY  2002  . BACK SURGERY  2011   [place] Left   . CHOLECYSTECTOMY  1996  . COLONOSCOPY  2000  [facility]   INT HEMORRHOIDS/FISSURE  . COLONOSCOPY  2003 [facility], CHANGE IN BOWEL HABITS   INT HEMORRHOIDS  . COLONOSCOPY  2006 [facility]   INT HEMORRHOIDS  . COLONOSCOPY  2007 [facility]   INT HEMORRHOIDS  . CYSTOSCOPY WITH STENT PLACEMENT Left 07/25/2012   Procedure: CYSTOSCOPY, left retograde pyelogram WITH left ureteral  STENT PLACEMENT;  Surgeon: Claybon Jabs, MD;  Location: [facility];  Service: Urology;  Laterality: Left;  . CYSTOSCOPY/RETROGRADE/URETEROSCOPY  12/04/2011   Procedure: CYSTOSCOPY/RETROGRADE/URETEROSCOPY;  Surgeon: Molli Hazard, MD;  Location: [facility];  Service: Urology;  Laterality: Right;  . CYSTOSCOPY/RETROGRADE/URETEROSCOPY/STONE EXTRACTION WITH BASKET Left 09/03/2012   Procedure:  CYSTOSCOPY/RETROGRADE pyelogram/digital URETEROSCOPY/STONE EXTRACTION WITH BASKET, left stent removal;  Surgeon: Molli Hazard, MD;  Location: [facility];  Service: Urology;  Laterality: Left;  . ESOPHAGOGASTRODUODENOSCOPY  2008   OEV:[id number] hiatal hernia./Normal esophagus without evidence of Barrett's mass, stricture, erosion or ulceration./Normal duodenal bulb and second portion of the duodenum./Diffuse erythema in the body and the antrum with occasional erosion.  Biopsies obtained via cold forceps to evaluate for H. pylori gastritis  . FRACTURE SURGERY  1999   right clavicle  . HOLMIUM LASER APPLICATION Left 01/01/8181   Procedure: HOLMIUM LASER APPLICATION;  Surgeon: Molli Hazard, MD;  Location: [facility];  Service: Urology;  Laterality: Left;  . KNEE ARTHROSCOPY  10/04/2010,    right knee arthroscopy, dr Theda Sers  . left knee arthroscopic surgery  1999  . Left salphingectomy secondary to ectopic pregnancy  1991  . PARTIAL HYSTERECTOMY  1991  . rotary cuff  Right 05/14/2014   [facility]  . UPPER GASTROINTESTINAL ENDOSCOPY  2008 abd pain   H. pylori gastritis  . UPPER GASTROINTESTINAL ENDOSCOPY  1996 [place]   PUD   Family History  Problem Relation Age of Onset  . Heart disease Mother   . Hypertension Mother   . Cancer Mother        Cervical   . Asthma Mother   . Heart attack Father   . Cancer Father        Prostate  . Colon cancer Father        DECEASED AGE 88  . Colon polyps Neg Hx    Social History   Socioeconomic History  . Marital status: Single    Spouse name: Not on file  . Number of children: 1  . Years of education: 28  . Highest education level: 12th grade  Occupational History  . Occupation: Disable   Social Needs  . Financial resource strain: Not hard at all  . Food insecurity:    Worry: Never true    Inability: Never true  . Transportation needs:    Medical: No    Non-medical: No  Tobacco Use  . Smoking status: Never Smoker  . Smokeless  tobacco: Never Used  Substance and Sexual Activity  . Alcohol use: No  . Drug use: No  . Sexual activity: Not Currently    Partners: Male    Birth control/protection: Surgical    Comment: hyst  Lifestyle  . Physical activity:    Days per week: 7 days    Minutes per session: 20 min  . Stress: Only a little  Relationships  . Social connections:    Talks on phone: More than three times a week    Gets together: Once a week    Attends religious service: 1 to 4 times per year    Active member of club or organization:  Yes    Attends meetings of clubs or organizations: Never    Relationship status: Never married  Other Topics Concern  . Not on file  Social History Narrative   Lives alone     Outpatient Encounter Medications as of 04/10/2018  Medication Sig  . albuterol (PROAIR HFA) 108 (90 Base) MCG/ACT inhaler INHALE 2 PUFFS BY MOUTH EVERY 6-8HRS AS NEEDED  . amLODipine (NORVASC) 10 MG tablet TAKE 1 TABLET BY MOUTH EVERY DAY  . azelastine (ASTELIN) 0.1 % nasal spray Place 2 sprays into both nostrils 2 (two) times daily. Use in each nostril as directed  . budesonide-formoterol (SYMBICORT) 160-4.5 MCG/ACT inhaler Inhale 2 puffs into the lungs 2 (two) times daily.  . cloNIDine (CATAPRES - DOSED IN MG/24 HR) 0.3 mg/24hr patch Place 1 patch (0.3 mg total) onto the skin once a week.  . cloNIDine (CATAPRES) 0.3 MG tablet TAKE ONE TABLET AT 9 PM EVERY NIGHT FOR HIGH BLOOD PRESSURE  . cyclobenzaprine (FLEXERIL) 10 MG tablet TAKE 1 TABLET BY MOUTH 3 TIMES A DAY  . diazepam (VALIUM) 5 MG tablet One tablet three times daily for anxiety and muscle spasm  . DULoxetine (CYMBALTA) 60 MG capsule TAKE 1 CAPSULE BY MOUTH TWICE A DAY  . ergocalciferol (VITAMIN D2) 50000 units capsule Take 1 capsule (50,000 Units total) by mouth once a week. One capsule once weekly  . esomeprazole (NEXIUM) 40 MG capsule TAKE 1 CAPSULE (40 MG TOTAL) BY MOUTH DAILY.  . fluconazole (DIFLUCAN) 150 MG tablet ONE TABLET ONCE  DAILY FOR 2 DAYS  . fluticasone (FLONASE) 50 MCG/ACT nasal spray Place 2 sprays into both nostrils daily. (Patient taking differently: Place 2 sprays into both nostrils as needed. )  . furosemide (LASIX) 40 MG tablet TAKE 1 TABLET BY MOUTH TWICE A DAY  . gabapentin (NEURONTIN) 800 MG tablet TAKE 1 TABLET BY MOUTH 4 TIMES A DAY  . hydrocortisone (ANUSOL-HC) 2.5 % rectal cream Place 1 application rectally 4 (four) times daily. FOR 12 DAYS  . hydrOXYzine (ATARAX/VISTARIL) 10 MG tablet Take 10-30 mg by mouth at bedtime as needed for itching.  Marland Kitchen ipratropium-albuterol (DUONEB) 0.5-2.5 (3) MG/3ML SOLN Take 3 mLs by nebulization every 6 (six) hours as needed.  Marland Kitchen KLOR-CON M20 20 MEQ tablet TAKE 1 TABLET (20 MEQ TOTAL) BY MOUTH 2 (TWO) TIMES DAILY.  Marland Kitchen loratadine (CLARITIN) 10 MG tablet Take 1 tablet (10 mg total) by mouth daily.  . meclizine (ANTIVERT) 25 MG tablet Take 25 mg by mouth 3 (three) times daily as needed for dizziness.  . metFORMIN (GLUCOPHAGE) 1000 MG tablet TAKE 1 TABLET BY MOUTH TWICE A DAY WITH FOOD  . montelukast (SINGULAIR) 10 MG tablet TAKE 1 TABLET (10 MG TOTAL) BY MOUTH AT BEDTIME.  Marland Kitchen NITROSTAT 0.4 MG SL tablet PLACE 1 TABLET UNDER TONGUE IF NEEDED FOR CHEST PAIN..MAX OF 3 TIMES  . oxyCODONE (OXYCONTIN) 40 mg 12 hr tablet Take 1 tablet (40 mg total) by mouth every 12 (twelve) hours.  . Tapentadol HCl (NUCYNTA ER) 150 MG TB12 Take 150 mg by mouth 2 (two) times daily.  Marland Kitchen tiZANidine (ZANAFLEX) 2 MG tablet 1 tablet twice a day as needed for pain  . triamcinolone cream (KENALOG) 0.1 % Apply 1 application topically 2 (two) times daily.  Marland Kitchen triamterene-hydrochlorothiazide (MAXZIDE) 75-50 MG tablet TAKE 1 TABLET BY MOUTH DAILY.  Marland Kitchen zolpidem (AMBIEN) 10 MG tablet Take 1 tablet (10 mg total) by mouth at bedtime.  . [DISCONTINUED] losartan-hydrochlorothiazide (HYZAAR) 50-12.5 MG per tablet TAKE  1 TABLET BY MOUTH ONCE DAILY *REPLACES DIOVAN/HCTZ*   No facility-administered encounter medications on  file as of 04/10/2018.     Activities of Daily Living In your present state of health, do you have any difficulty performing the following activities: 04/10/2018  Hearing? N  Vision? N  Difficulty concentrating or making decisions? N  Walking or climbing stairs? Y  Dressing or bathing? Y  Doing errands, shopping? Y  Preparing Food and eating ? N  Using the Toilet? N  In the past six months, have you accidently leaked urine? N  Do you have problems with loss of bowel control? N  Managing your Medications? N  Managing your Finances? N  Housekeeping or managing your Housekeeping? Y  Some recent data might be hidden    Patient Care Team: Fayrene Helper, MD as PCP - General Danie Binder, MD (Gastroenterology) Rolan Bucco, MD as Attending Physician (Urology) Sydnee Cabal, MD as Consulting Physician (Orthopedic Surgery) Essen[place]her, Clarene Duke, OT as Occupational Therapist (Occupational Therapy)    Assessment:   This is a routine wellness examination for Angela.  Exercise Activities and Dietary recommendations Current Exercise Habits: Home exercise routine, Type of exercise: walking, Time (Minutes): 20, Frequency (Times/Week): 6, Weekly Exercise (Minutes/Week): 120, Intensity: Mild, Exercise limited by: orthopedic condition(s);respiratory conditions(s);cardiac condition(s)  Goals    . DIET - DECREASE SODA OR JUICE INTAKE    . Exercise 3x per week (30 min per time)     Patient would like to start walking 3 times a week for 60 minutes at a time.       Fall Risk Fall Risk  04/10/2018 04/03/2018 02/07/2018 10/08/2017 07/05/2017  Falls in the past year? 1 0 No No Yes  Number falls in past yr: 0 - - - 1  Injury with Fall? 1 - - - -  Risk for fall due to : History of fall(s);Medication side effect;Impaired balance/gait;Impaired mobility - - - -  Follow up Falls prevention discussed - - - -   Is the patient's home free of loose throw rugs in walkways, pet beds,  electrical cords, etc?   no      Grab bars in the bathroom? yes      Handrails on the stairs?   yes      Adequate lighting?   yes  Timed Get Up and Go performed: Patient able to perform in 9 seconds with the assistance of a cane   Depression Screen PHQ 2/9 Scores 04/10/2018 04/03/2018 02/07/2018 10/08/2017  PHQ - 2 Score 1 0 0 3  PHQ- 9 Score - - - 15     Cognitive Function     6CIT Screen 04/10/2018 05/03/2016  What Year? 0 points 0 points  What month? 0 points 0 points  What time? 0 points 0 points  Count back from 20 0 points 0 points  Months in reverse 0 points 0 points  Repeat phrase 4 points 0 points  Total Score 4 0    Immunization History  Administered Date(s) Administered  . Influenza Split 03/30/2011  . Influenza Whole 03/21/2005, 02/09/2006  . Influenza,inj,Quad PF,6+ Mos 02/11/2014, 07/27/2015, 03/09/2016, 01/29/2017, 02/07/2018  . Pneumococcal Polysaccharide-23 04/17/2006, 09/14/2015  . Td 04/17/2006    Qualifies for Shingles Vaccine?N/A   Screening Tests Health Maintenance  Topic Date Due  . COLONOSCOPY  05/05/2010  . PAP SMEAR  01/28/2017  . OPHTHALMOLOGY EXAM  03/07/2017  . TETANUS/TDAP  09/05/2018 (Originally 04/17/2016)  . HEMOGLOBIN A1C  08/08/2018  .  FOOT EXAM  02/08/2019  . MAMMOGRAM  02/08/2019  . INFLUENZA VACCINE  Completed  . PNEUMOCOCCAL POLYSACCHARIDE VACCINE AGE 74-64 HIGH RISK  Completed  . Hepatitis C Screening  Completed  . HIV Screening  Completed    Cancer Screenings: Lung: Low Dose CT Chest recommended if Age 62-80 years, 30 pack-year currently smoking OR have quit w/in 15years. Patient does not qualify. Breast:  Up to date on Mammogram? Yes   Up to date of Bone Density/Dexa? No Colorectal: scheduled   Additional Screenings:  Hepatitis C Screening: complete      Plan:   Continue to exercise, lose weight and decrease soda intake   I have personally reviewed and noted the following in the patient's chart:   . Medical and  social history . Use of alcohol, tobacco or illicit drugs  . Current medications and supplements . Functional ability and status . Nutritional status . Physical activity . Advanced directives . List of other physicians . Hospitalizations, surgeries, and ER visits in previous 12 months . Vitals . Screenings to include cognitive, depression, and falls . Referrals and appointments  In addition, I have reviewed and discussed with patient certain preventive protocols, quality metrics, and best practice recommendations. A written personalized care plan for preventive services as well as general preventive health recommendations were provided to patient.     Hayden Pedro, LPN  03/00/9233

## 2018-04-10 NOTE — Patient Instructions (Signed)
Angela [name] , Thank you for taking time to come for your Medicare Wellness Visit. I appreciate your ongoing commitment to your health goals. Please review the following plan we discussed and let me know if I can assist you in the future.   Screening recommendations/referrals: Colonoscopy: Scheduled  Mammogram: up to date  Bone Density: N/A  Recommended yearly ophthalmology/optometry visit for glaucoma screening and checkup Recommended yearly dental visit for hygiene and checkup  Vaccinations: Influenza vaccine: up to date  Pneumococcal vaccine: Up to date  Tdap vaccine: up to date  Shingles vaccine: N/A     Advanced directives: Patient states she has in place   Conditions/risks identified: Hypertension, Asthma, Chronic pain, diabetes    Next appointment: Wellness visit in one year   Preventive Care 40-64 Years, Female Preventive care refers to lifestyle choices and visits with your health care provider that can promote health and wellness. What does preventive care include?  A yearly physical exam. This is also called an annual well check.  Dental exams once or twice a year.  Routine eye exams. Ask your health care provider how often you should have your eyes checked.  Personal lifestyle choices, including:  Daily care of your teeth and gums.  Regular physical activity.  Eating a healthy diet.  Avoiding tobacco and drug use.  Limiting alcohol use.  Practicing safe sex.  Taking low-dose aspirin daily starting at age 53.  Taking vitamin and mineral supplements as recommended by your health care provider. What happens during an annual well check? The services and screenings done by your health care provider during your annual well check will depend on your age, overall health, lifestyle risk factors, and family history of disease. Counseling  Your health care provider may ask you questions about your:  Alcohol use.  Tobacco use.  Drug use.  Emotional  well-being.  Home and relationship well-being.  Sexual activity.  Eating habits.  Work and work Statistician.  Method of birth control.  Menstrual cycle.  Pregnancy history. Screening  You may have the following tests or measurements:  Height, weight, and BMI.  Blood pressure.  Lipid and cholesterol levels. These may be checked every 5 years, or more frequently if you are over 65 years old.  Skin check.  Lung cancer screening. You may have this screening every year starting at age 23 if you have a 30-pack-year history of smoking and currently smoke or have quit within the past 15 years.  Fecal occult blood test (FOBT) of the stool. You may have this test every year starting at age 19.  Flexible sigmoidoscopy or colonoscopy. You may have a sigmoidoscopy every 5 years or a colonoscopy every 10 years starting at age 40.  Hepatitis C blood test.  Hepatitis B blood test.  Sexually transmitted disease (STD) testing.  Diabetes screening. This is done by checking your blood sugar (glucose) after you have not eaten for a while (fasting). You may have this done every 1-3 years.  Mammogram. This may be done every 1-2 years. Talk to your health care provider about when you should start having regular mammograms. This may depend on whether you have a family history of breast cancer.  BRCA-related cancer screening. This may be done if you have a family history of breast, ovarian, tubal, or peritoneal cancers.  Pelvic exam and Pap test. This may be done every 3 years starting at age 68. Starting at age 32, this may be done every 5 years if you have a  Pap test in combination with an HPV test.  Bone density scan. This is done to screen for osteoporosis. You may have this scan if you are at high risk for osteoporosis. Discuss your test results, treatment options, and if necessary, the need for more tests with your health care provider. Vaccines  Your health care provider may recommend  certain vaccines, such as:  Influenza vaccine. This is recommended every year.  Tetanus, diphtheria, and acellular pertussis (Tdap, Td) vaccine. You may need a Td booster every 10 years.  Zoster vaccine. You may need this after age 32.  Pneumococcal 13-valent conjugate (PCV13) vaccine. You may need this if you have certain conditions and were not previously vaccinated.  Pneumococcal polysaccharide (PPSV23) vaccine. You may need one or two doses if you smoke cigarettes or if you have certain conditions. Talk to your health care provider about which screenings and vaccines you need and how often you need them. This information is not intended to replace advice given to you by your health care provider. Make sure you discuss any questions you have with your health care provider. Document Released: 05/14/2015 Document Revised: 01/05/2016 Document Reviewed: 02/16/2015 Elsevier Interactive Patient Education  2017 [place] Prevention in the Home Falls can cause injuries. They can happen to people of all ages. There are many things you can do to make your home safe and to help prevent falls. What can I do on the outside of my home?  Regularly fix the edges of walkways and driveways and fix any cracks.  Remove anything that might make you trip as you walk through a door, such as a raised step or threshold.  Trim any bushes or trees on the path to your home.  Use bright outdoor lighting.  Clear any walking paths of anything that might make someone trip, such as rocks or tools.  Regularly check to see if handrails are loose or broken. Make sure that both sides of any steps have handrails.  Any raised decks and porches should have guardrails on the edges.  Have any leaves, snow, or ice cleared regularly.  Use sand or salt on walking paths during winter.  Clean up any spills in your garage right away. This includes oil or grease spills. What can I do in the bathroom?  Use  night lights.  Install grab bars by the toilet and in the tub and shower. Do not use towel bars as grab bars.  Use non-skid mats or decals in the tub or shower.  If you need to sit down in the shower, use a plastic, non-slip stool.  Keep the floor dry. Clean up any water that spills on the floor as soon as it happens.  Remove soap buildup in the tub or shower regularly.  Attach bath mats securely with double-sided non-slip rug tape.  Do not have throw rugs and other things on the floor that can make you trip. What can I do in the bedroom?  Use night lights.  Make sure that you have a light by your bed that is easy to reach.  Do not use any sheets or blankets that are too big for your bed. They should not hang down onto the floor.  Have a firm chair that has side arms. You can use this for support while you get dressed.  Do not have throw rugs and other things on the floor that can make you trip. What can I do in the kitchen?  Clean  up any spills right away.  Avoid walking on wet floors.  Keep items that you use a lot in easy-to-reach places.  If you need to reach something above you, use a strong step stool that has a grab bar.  Keep electrical cords out of the way.  Do not use floor polish or wax that makes floors slippery. If you must use wax, use non-skid floor wax.  Do not have throw rugs and other things on the floor that can make you trip. What can I do with my stairs?  Do not leave any items on the stairs.  Make sure that there are handrails on both sides of the stairs and use them. Fix handrails that are broken or loose. Make sure that handrails are as long as the stairways.  Check any carpeting to make sure that it is firmly attached to the stairs. Fix any carpet that is loose or worn.  Avoid having throw rugs at the top or bottom of the stairs. If you do have throw rugs, attach them to the floor with carpet tape.  Make sure that you have a light switch at  the top of the stairs and the bottom of the stairs. If you do not have them, ask someone to add them for you. What else can I do to help prevent falls?  Wear shoes that:  Do not have high heels.  Have rubber bottoms.  Are comfortable and fit you well.  Are closed at the toe. Do not wear sandals.  If you use a stepladder:  Make sure that it is fully opened. Do not climb a closed stepladder.  Make sure that both sides of the stepladder are locked into place.  Ask someone to hold it for you, if possible.  Clearly mark and make sure that you can see:  Any grab bars or handrails.  First and last steps.  Where the edge of each step is.  Use tools that help you move around (mobility aids) if they are needed. These include:  Canes.  Walkers.  Scooters.  Crutches.  Turn on the lights when you go into a dark area. Replace any light bulbs as soon as they burn out.  Set up your furniture so you have a clear path. Avoid moving your furniture around.  If any of your floors are uneven, fix them.  If there are any pets around you, be aware of where they are.  Review your medicines with your doctor. Some medicines can make you feel dizzy. This can increase your chance of falling. Ask your doctor what other things that you can do to help prevent falls. This information is not intended to replace advice given to you by your health care provider. Make sure you discuss any questions you have with your health care provider. Document Released: 02/11/2009 Document Revised: 09/23/2015 Document Reviewed: 05/22/2014 Elsevier Interactive Patient Education  2017 [employer].

## 2018-04-13 ENCOUNTER — Other Ambulatory Visit: Payer: Self-pay | Admitting: Family Medicine

## 2018-04-16 ENCOUNTER — Other Ambulatory Visit: Payer: Self-pay | Admitting: Family Medicine

## 2018-04-16 MED ORDER — DIAZEPAM 5 MG PO TABS: ORAL_TABLET | ORAL | 3 refills | Status: DC

## 2018-04-16 NOTE — Progress Notes (Signed)
Valium 5

## 2018-04-19 DIAGNOSIS — G4733 Obstructive sleep apnea (adult) (pediatric): Secondary | ICD-10-CM | POA: Diagnosis not present

## 2018-05-05 ENCOUNTER — Other Ambulatory Visit: Payer: Self-pay

## 2018-05-05 ENCOUNTER — Emergency Department (HOSPITAL_COMMUNITY): Payer: Medicare Other

## 2018-05-05 ENCOUNTER — Encounter (HOSPITAL_COMMUNITY): Payer: Self-pay

## 2018-05-05 ENCOUNTER — Observation Stay (HOSPITAL_COMMUNITY)
Admission: EM | Admit: 2018-05-05 | Discharge: 2018-05-09 | Disposition: A | Discharge status: Home / Self Care | Payer: Medicare Other | Attending: Internal Medicine | Admitting: Internal Medicine

## 2018-05-05 DIAGNOSIS — Z90711 Acquired absence of uterus with remaining cervical stump: Secondary | ICD-10-CM | POA: Insufficient documentation

## 2018-05-05 DIAGNOSIS — E1140 Type 2 diabetes mellitus with diabetic neuropathy, unspecified: Secondary | ICD-10-CM | POA: Insufficient documentation

## 2018-05-05 DIAGNOSIS — Z886 Allergy status to analgesic agent status: Secondary | ICD-10-CM | POA: Insufficient documentation

## 2018-05-05 DIAGNOSIS — I252 Old myocardial infarction: Secondary | ICD-10-CM | POA: Diagnosis not present

## 2018-05-05 DIAGNOSIS — R079 Chest pain, unspecified: Secondary | ICD-10-CM | POA: Diagnosis not present

## 2018-05-05 DIAGNOSIS — I5030 Unspecified diastolic (congestive) heart failure: Secondary | ICD-10-CM | POA: Insufficient documentation

## 2018-05-05 DIAGNOSIS — K219 Gastro-esophageal reflux disease without esophagitis: Secondary | ICD-10-CM | POA: Insufficient documentation

## 2018-05-05 DIAGNOSIS — I110 Hypertensive heart disease with heart failure: Secondary | ICD-10-CM | POA: Diagnosis not present

## 2018-05-05 DIAGNOSIS — E785 Hyperlipidemia, unspecified: Secondary | ICD-10-CM | POA: Diagnosis not present

## 2018-05-05 DIAGNOSIS — R0789 Other chest pain: Secondary | ICD-10-CM | POA: Diagnosis not present

## 2018-05-05 DIAGNOSIS — E039 Hypothyroidism, unspecified: Secondary | ICD-10-CM | POA: Diagnosis not present

## 2018-05-05 DIAGNOSIS — E669 Obesity, unspecified: Secondary | ICD-10-CM | POA: Diagnosis present

## 2018-05-05 DIAGNOSIS — Z885 Allergy status to narcotic agent status: Secondary | ICD-10-CM | POA: Diagnosis not present

## 2018-05-05 DIAGNOSIS — G4733 Obstructive sleep apnea (adult) (pediatric): Secondary | ICD-10-CM | POA: Diagnosis not present

## 2018-05-05 DIAGNOSIS — R7989 Other specified abnormal findings of blood chemistry: Secondary | ICD-10-CM | POA: Diagnosis not present

## 2018-05-05 DIAGNOSIS — I214 Non-ST elevation (NSTEMI) myocardial infarction: Secondary | ICD-10-CM | POA: Diagnosis not present

## 2018-05-05 DIAGNOSIS — R531 Weakness: Secondary | ICD-10-CM | POA: Diagnosis not present

## 2018-05-05 DIAGNOSIS — G8929 Other chronic pain: Secondary | ICD-10-CM | POA: Diagnosis present

## 2018-05-05 DIAGNOSIS — I25119 Atherosclerotic heart disease of native coronary artery with unspecified angina pectoris: Principal | ICD-10-CM | POA: Insufficient documentation

## 2018-05-05 DIAGNOSIS — E1159 Type 2 diabetes mellitus with other circulatory complications: Secondary | ICD-10-CM

## 2018-05-05 DIAGNOSIS — E119 Type 2 diabetes mellitus without complications: Secondary | ICD-10-CM

## 2018-05-05 DIAGNOSIS — Z6841 Body Mass Index (BMI) 40.0 and over, adult: Secondary | ICD-10-CM | POA: Insufficient documentation

## 2018-05-05 DIAGNOSIS — I420 Dilated cardiomyopathy: Secondary | ICD-10-CM | POA: Diagnosis not present

## 2018-05-05 DIAGNOSIS — Z8249 Family history of ischemic heart disease and other diseases of the circulatory system: Secondary | ICD-10-CM | POA: Insufficient documentation

## 2018-05-05 DIAGNOSIS — R52 Pain, unspecified: Secondary | ICD-10-CM

## 2018-05-05 DIAGNOSIS — Z7951 Long term (current) use of inhaled steroids: Secondary | ICD-10-CM | POA: Diagnosis not present

## 2018-05-05 DIAGNOSIS — I2510 Atherosclerotic heart disease of native coronary artery without angina pectoris: Secondary | ICD-10-CM | POA: Diagnosis present

## 2018-05-05 DIAGNOSIS — I051 Rheumatic mitral insufficiency: Secondary | ICD-10-CM | POA: Insufficient documentation

## 2018-05-05 DIAGNOSIS — Z888 Allergy status to other drugs, medicaments and biological substances status: Secondary | ICD-10-CM | POA: Insufficient documentation

## 2018-05-05 DIAGNOSIS — I10 Essential (primary) hypertension: Secondary | ICD-10-CM | POA: Diagnosis not present

## 2018-05-05 DIAGNOSIS — R0602 Shortness of breath: Secondary | ICD-10-CM | POA: Diagnosis not present

## 2018-05-05 DIAGNOSIS — L409 Psoriasis, unspecified: Secondary | ICD-10-CM | POA: Diagnosis not present

## 2018-05-05 DIAGNOSIS — Z79899 Other long term (current) drug therapy: Secondary | ICD-10-CM | POA: Diagnosis not present

## 2018-05-05 DIAGNOSIS — E782 Mixed hyperlipidemia: Secondary | ICD-10-CM | POA: Diagnosis present

## 2018-05-05 LAB — CBC WITH DIFFERENTIAL/PLATELET
BAND NEUTROPHILS: 0 %
Basophils Absolute: 0 10*3/uL (ref 0.0–0.1)
Basophils Relative: 0 %
Blasts: 0 %
EOS ABS: 0.2 10*3/uL (ref 0.0–0.5)
Eosinophils Relative: 3 %
HCT: 41.3 % (ref 36.0–46.0)
Hemoglobin: 13.1 g/dL (ref 12.0–15.0)
Lymphocytes Relative: 41 %
Lymphs Abs: 2.8 10*3/uL (ref 0.7–4.0)
MCH: 26 pg (ref 26.0–34.0)
MCHC: 31.7 g/dL (ref 30.0–36.0)
MCV: 81.9 fL (ref 80.0–100.0)
Metamyelocytes Relative: 0 %
Monocytes Absolute: 0.4 10*3/uL (ref 0.1–1.0)
Monocytes Relative: 6 %
Myelocytes: 0 %
Neutro Abs: 3.4 10*3/uL (ref 1.7–7.7)
Neutrophils Relative %: 50 %
Other: 0 %
PLATELETS: 434 10*3/uL — AB (ref 150–400)
Promyelocytes Relative: 0 %
RBC: 5.04 MIL/uL (ref 3.87–5.11)
RDW: 14.7 % (ref 11.5–15.5)
WBC: 6.8 10*3/uL (ref 4.0–10.5)
nRBC: 0 % (ref 0.0–0.2)
nRBC: 0 /100 WBC

## 2018-05-05 LAB — COMPREHENSIVE METABOLIC PANEL
ALT: 24 U/L (ref 0–44)
AST: 19 U/L (ref 15–41)
Albumin: 3.9 g/dL (ref 3.5–5.0)
Alkaline Phosphatase: 63 U/L (ref 38–126)
Anion gap: 13 (ref 5–15)
BUN: 17 mg/dL (ref 6–20)
CO2: 24 mmol/L (ref 22–32)
CREATININE: 0.99 mg/dL (ref 0.44–1.00)
Calcium: 9.6 mg/dL (ref 8.9–10.3)
Chloride: 99 mmol/L (ref 98–111)
GFR calc Af Amer: >60 mL/min (ref >60)
GFR calc non Af Amer: >60 mL/min (ref >60)
Glucose, Bld: 154 mg/dL — ABNORMAL HIGH (ref 70–99)
Potassium: 3.5 mmol/L (ref 3.5–5.1)
Sodium: 136 mmol/L (ref 135–145)
Total Bilirubin: 0.6 mg/dL (ref 0.3–1.2)
Total Protein: 8 g/dL (ref 6.5–8.1)

## 2018-05-05 LAB — TROPONIN I: Troponin I: <0.03 ng/mL (ref <0.03)

## 2018-05-05 MED ORDER — ONDANSETRON HCL 4 MG/2ML IJ SOLN
4.0000 mg | Freq: Once | INTRAMUSCULAR | Status: AC
  Administered 2018-05-05: 4 mg via INTRAVENOUS
  Filled 2018-05-05: qty 2

## 2018-05-05 MED ORDER — LORAZEPAM 2 MG/ML IJ SOLN
0.5000 mg | Freq: Once | INTRAMUSCULAR | Status: AC
  Administered 2018-05-05: 0.5 mg via INTRAVENOUS
  Filled 2018-05-05: qty 1

## 2018-05-05 NOTE — ED Triage Notes (Signed)
Per [facility], pt from home w/ a c/o non-radiating CP that began last night at ~2245. She was given 324 of ASA and one 0.4mg  SL nitro tablet that relieved her pain. LS clear, bilaterally. 20ga IV L AC.

## 2018-05-05 NOTE — ED Provider Notes (Signed)
[facility] EMERGENCY DEPARTMENT Provider Note   CSN: [id number] Arrival date & time: 05/05/18  2144     History   Chief Complaint Chief Complaint  Patient presents with  . Chest Pain    HPI Angela [name] is a 58 y.o. female.  Patient to ED for evaluation of chest pain that started at 10:45 pm 05/04/17 (24 hours ago) where she describes heaviness in her left arm, moving into chest "like someone standing on my chest", associated with SOB, diaphoresis and nausea. She got up and walked outside "to get some air" and subsequently went to bed thinking it would go away. She reports a constant discomfort that did not radiate and started while at rest. She reports "I have had 4 heart attacks in the past", last seen by Dr. Montez Morita in 2009. She denies stent placement because "any procedure makes my blood pressure shoot up and they stop the procedure". Per chart review, cardiac cath in 2008 showed "mild nonobstructive CAD" ([place]). She has NTG at home but did not use it for current symptoms because it was expired. EMS was called tonight when the symptoms returned. She was given ASA in route and one NTG which improved her pain. No recent fever, cough, vomiting or diarrhea illnesses. She states she is compliant with her medications but didn't take them today because "I just stayed in my recliner and prayed".   The history is provided by the patient. No language interpreter was used.  Chest Pain   Associated symptoms include diaphoresis, nausea, shortness of breath and weakness. Pertinent negatives include no fever and no vomiting.    Past Medical History:  Diagnosis Date  . Anxiety   . Arthritis   . Asthma   . Coronary atherosclerosis of native coronary artery    Mild nonobstructive CAD by cardiac catheterization 2008 - Dr. Terrence Dupont  . Degenerative disc disease   . Depression   . Dysrhythmia   . Essential hypertension, benign   . Folliculitis 3/41/9622  . GERD  (gastroesophageal reflux disease)   . H/O hiatal hernia   . Helicobacter pylori gastritis 2008  . Hypercholesterolemia   . Hypertension   . Hyperthyroidism    s/p radiation  . Low back pain   . Migraine   . Nephrolithiasis    Recurring episodes since 2004  . Severe obesity (BMI >= 40) (HCC)   . Sleep apnea    STOP BANG SCORE 6no cpap used  . Type 2 diabetes mellitus with diabetic neuropathy [facility])     Patient Active Problem List   Diagnosis Date Noted  . Transaminitis 03/09/2018  . Contraindication to statin medication 03/09/2018  . Syncope 10/14/2017  . Elevated TSH 10/14/2017  . Insomnia due to anxiety and fear 02/11/2017  . Encounter for pain management 11/09/2016  . Chronic wrist pain, left 11/06/2016  . Chronic left hip pain 11/06/2016  . Mass of right ovary 09/04/2016  . FH: colon cancer 09/01/2016  . Numbness of left hand 08/21/2016  . Allergic rhinitis 12/05/2015  . Upper airway cough syndrome 09/19/2015  . Back muscle spasm 09/14/2015  . Psoriasis 09/14/2015  . Knee pain, right 03/29/2015  . Cervical neck pain with evidence of disc disease 04/13/2014  . Coronary atherosclerosis of native coronary artery 05/26/2013  . Hemorrhoid 04/08/2012  . Vitamin D deficiency 04/04/2012  . Nephrolithiasis 12/10/2011  . Sleep apnea syndrome 12/05/2011  . GERD (gastroesophageal reflux disease) 08/16/2011  . Back pain 11/03/2010  . Hyperlipidemia LDL goal <70  08/11/2008  . Morbid obesity ([place]) 01/06/2008  . Diabetes mellitus without complication ([place]) 34/19/6222  . Depression with anxiety 05/05/2006  . Essential hypertension 05/05/2006  . Asthma 05/05/2006  . Helicobacter pylori gastritis 05/01/2006    Past Surgical History:  Procedure Laterality Date  . ABDOMINAL HYSTERECTOMY    . BACK SURGERY  1993  . BACK SURGERY  1994  . BACK SURGERY  2002  . BACK SURGERY  2011   [place] Left   . CHOLECYSTECTOMY  1996  . COLONOSCOPY  2000 [facility]   INT  HEMORRHOIDS/FISSURE  . COLONOSCOPY  2003 [facility], CHANGE IN BOWEL HABITS   INT HEMORRHOIDS  . COLONOSCOPY  2006 [facility]   INT HEMORRHOIDS  . COLONOSCOPY  2007 [facility]   INT HEMORRHOIDS  . CYSTOSCOPY WITH STENT PLACEMENT Left 07/25/2012   Procedure: CYSTOSCOPY, left retograde pyelogram WITH left ureteral  STENT PLACEMENT;  Surgeon: Claybon Jabs, MD;  Location: [facility];  Service: Urology;  Laterality: Left;  . CYSTOSCOPY/RETROGRADE/URETEROSCOPY  12/04/2011   Procedure: CYSTOSCOPY/RETROGRADE/URETEROSCOPY;  Surgeon: Molli Hazard, MD;  Location: [facility];  Service: Urology;  Laterality: Right;  . CYSTOSCOPY/RETROGRADE/URETEROSCOPY/STONE EXTRACTION WITH BASKET Left 09/03/2012   Procedure: CYSTOSCOPY/RETROGRADE pyelogram/digital URETEROSCOPY/STONE EXTRACTION WITH BASKET, left stent removal;  Surgeon: Molli Hazard, MD;  Location: [facility];  Service: Urology;  Laterality: Left;  . ESOPHAGOGASTRODUODENOSCOPY  2008   LNL:GXQJJ hiatal hernia./Normal esophagus without evidence of Barrett's mass, stricture, erosion or ulceration./Normal duodenal bulb and second portion of the duodenum./Diffuse erythema in the body and the antrum with occasional erosion.  Biopsies obtained via cold forceps to evaluate for H. pylori gastritis  . FRACTURE SURGERY  1999   right clavicle  . HOLMIUM LASER APPLICATION Left 01/03/1739   Procedure: HOLMIUM LASER APPLICATION;  Surgeon: Molli Hazard, MD;  Location: [facility];  Service: Urology;  Laterality: Left;  . KNEE ARTHROSCOPY  10/04/2010,    right knee arthroscopy, dr Theda Sers  . left knee arthroscopic surgery  1999  . Left salphingectomy secondary to ectopic pregnancy  1991  . PARTIAL HYSTERECTOMY  1991  . rotary cuff  Right 05/14/2014   [facility] out[facility]  . UPPER GASTROINTESTINAL ENDOSCOPY  2008 abd pain   H. pylori gastritis  . UPPER GASTROINTESTINAL ENDOSCOPY  1996 [facility] [place]   PUD     OB History    Gravida  3   Para  2   Term  1   Preterm  1   AB  1    Living  1     SAB      TAB      Ectopic  1   Multiple      Live Births  2            Home Medications    Prior to Admission medications   Medication Sig Start Date End Date Taking? Authorizing Provider  albuterol [facility] HFA) 108 (90 Base) MCG/ACT inhaler INHALE 2 PUFFS BY MOUTH EVERY 6-8HRS AS NEEDED 01/09/18   Fayrene Helper, MD  amLODipine (NORVASC) 10 MG tablet TAKE 1 TABLET BY MOUTH EVERY DAY 04/09/18   Fayrene Helper, MD  azelastine (ASTELIN) 0.1 % nasal spray Place 2 sprays into both nostrils 2 (two) times daily. Use in each nostril as directed 11/06/16   Fayrene Helper, MD  budesonide-formoterol [facility]) 160-4.5 MCG/ACT inhaler Inhale 2 puffs into the lungs 2 (two) times daily. 02/25/18   Fayrene Helper, MD  cloNIDine (  CATAPRES - DOSED IN MG/24 HR) 0.3 mg/24hr patch Place 1 patch (0.3 mg total) onto the skin once a week. 01/29/17   Fayrene Helper, MD  cloNIDine (CATAPRES) 0.3 MG tablet TAKE ONE TABLET AT 9 PM EVERY NIGHT FOR HIGH BLOOD PRESSURE 10/08/17   Fayrene Helper, MD  cyclobenzaprine (FLEXERIL) 10 MG tablet TAKE 1 TABLET BY MOUTH 3 TIMES A DAY 01/10/18   Fayrene Helper, MD  diazepam (VALIUM) 5 MG tablet One tablet three times daily for anxiety and muscle spasm 10/08/17   Fayrene Helper, MD  diazepam (VALIUM) 5 MG tablet Take one tablet by mouth three times daily for anxiety and back spasm 04/16/18 04/17/19  Fayrene Helper, MD  DULoxetine (CYMBALTA) 60 MG capsule TAKE 1 CAPSULE BY MOUTH TWICE A DAY 01/01/18   Fayrene Helper, MD  ergocalciferol (VITAMIN D2) 50000 units capsule Take 1 capsule (50,000 Units total) by mouth once a week. One capsule once weekly 10/14/17   Fayrene Helper, MD  esomeprazole (NEXIUM) 40 MG capsule TAKE 1 CAPSULE (40 MG TOTAL) BY MOUTH DAILY. 01/01/18   Fayrene Helper, MD  fluconazole (DIFLUCAN) 150 MG tablet ONE TABLET ONCE DAILY FOR 2 DAYS 05/23/17   Raylene Everts, MD  fluticasone  [facility]) 50 MCG/ACT nasal spray Place 2 sprays into both nostrils daily. Patient taking differently: Place 2 sprays into both nostrils as needed.  07/08/15   Fayrene Helper, MD  furosemide (LASIX) 40 MG tablet TAKE 1 TABLET BY MOUTH TWICE A DAY 03/01/15   Fayrene Helper, MD  gabapentin (NEURONTIN) 800 MG tablet TAKE 1 TABLET BY MOUTH 4 TIMES A DAY 11/28/17   Fayrene Helper, MD  hydrocortisone (ANUSOL-HC) 2.5 % rectal cream Place 1 application rectally 4 (four) times daily. FOR 12 DAYS 09/26/16   Fields, Marga Melnick, MD  hydrOXYzine (ATARAX/VISTARIL) 10 MG tablet Take 10-30 mg by mouth at bedtime as needed for itching. 01/06/18   [provider]  ipratropium-albuterol (DUONEB) 0.5-2.5 (3) MG/3ML SOLN Take 3 mLs by nebulization every 6 (six) hours as needed. 07/27/15   Fayrene Helper, MD  KLOR-CON M20 20 MEQ tablet TAKE 1 TABLET (20 MEQ TOTAL) BY MOUTH 2 (TWO) TIMES DAILY. 02/06/18   Fayrene Helper, MD  loratadine (CLARITIN) 10 MG tablet Take 1 tablet (10 mg total) by mouth daily. 08/03/17   Fayrene Helper, MD  meclizine (ANTIVERT) 25 MG tablet Take 25 mg by mouth 3 (three) times daily as needed for dizziness.    [provider]  metFORMIN (GLUCOPHAGE) 1000 MG tablet TAKE 1 TABLET BY MOUTH TWICE A DAY WITH FOOD 12/28/17   Fayrene Helper, MD  montelukast (SINGULAIR) 10 MG tablet TAKE 1 TABLET (10 MG TOTAL) BY MOUTH AT BEDTIME. 04/05/16   Fayrene Helper, MD  NITROSTAT 0.4 MG SL tablet PLACE 1 TABLET UNDER TONGUE IF NEEDED FOR CHEST PAIN.Bennie Pierini OF 3 TIMES 09/30/14   [provider]  oxyCODONE (OXYCONTIN) 40 mg 12 hr tablet Take 1 tablet (40 mg total) by mouth every 12 (twelve) hours. 04/08/18 05/08/18  Fayrene Helper, MD  Tapentadol HCl (NUCYNTA ER) 150 MG TB12 Take 150 mg by mouth 2 (two) times daily.    [provider]  tiZANidine (ZANAFLEX) 2 MG tablet 1 tablet twice a day as needed for pain    [provider]  triamcinolone cream  (KENALOG) 0.1 % Apply 1 application topically 2 (two) times daily. 02/26/15   Tanna Furry,  MD  triamterene-hydrochlorothiazide (MAXZIDE) 75-50 MG tablet TAKE 1 TABLET BY MOUTH DAILY. 10/02/17   Fayrene Helper, MD  zolpidem (AMBIEN) 10 MG tablet Take 1 tablet (10 mg total) by mouth at bedtime. 03/09/18 04/08/18  Fayrene Helper, MD  losartan-hydrochlorothiazide (HYZAAR) 50-12.5 MG per tablet TAKE 1 TABLET BY MOUTH ONCE DAILY *REPLACES DIOVAN/HCTZ* 04/20/11 07/11/11  Fayrene Helper, MD    Family History Family History  Problem Relation Age of Onset  . Heart disease Mother   . Hypertension Mother   . Cancer Mother        Cervical   . Asthma Mother   . Heart attack Father   . Cancer Father        Prostate  . Colon cancer Father        DECEASED AGE 31  . Colon polyps Neg Hx     Social History Social History   Tobacco Use  . Smoking status: Never Smoker  . Smokeless tobacco: Never Used  Substance Use Topics  . Alcohol use: No  . Drug use: No     Allergies   Morphine; Ace inhibitors; Aspirin; Losartan; Tapentadol; Tomato; and Adhesive [tape]   Review of Systems Review of Systems  Constitutional: Positive for diaphoresis. Negative for chills and fever.  HENT: Negative.   Respiratory: Positive for shortness of breath.   Cardiovascular: Positive for chest pain.  Gastrointestinal: Positive for nausea. Negative for vomiting.  Musculoskeletal: Negative.   Skin: Negative.   Neurological: Positive for weakness.     Physical Exam Updated Vital Signs BP (!) 169/101 (BP Location: Right Arm)   Pulse 79   Temp 98.1 F (36.7 C) (Oral)   Resp 16   Ht 5\' 4"  (1.626 m)   Wt (!) 140.6 kg   SpO2 97%   BMI 53.21 kg/m   Physical Exam Constitutional:      Appearance: She is well-developed.  HENT:     Head: Normocephalic.  Neck:     Musculoskeletal: Normal range of motion and neck supple.  Cardiovascular:     Rate and Rhythm: Normal rate and regular rhythm.      Heart sounds: No murmur.  Pulmonary:     Effort: Pulmonary effort is normal.     Breath sounds: Normal breath sounds. No wheezing, rhonchi or rales.  Abdominal:     General: Bowel sounds are normal.     Palpations: Abdomen is soft.     Tenderness: There is no abdominal tenderness. There is no guarding or rebound.  Musculoskeletal: Normal range of motion.     Right lower leg: No edema.     Left lower leg: No edema.  Skin:    General: Skin is warm and dry.     Findings: No rash.  Neurological:     Mental Status: She is alert.     Cranial Nerves: No cranial nerve deficit.      ED Treatments / Results  Labs (all labs ordered are listed, but only abnormal results are displayed) Labs Reviewed - No data to display  EKG None  Radiology No results found.  Procedures Procedures (including critical care time) CRITICAL CARE Performed by: Dewaine Oats   Total critical care time: 45 minutes  Critical care time was exclusive of separately billable procedures and treating other patients.  Critical care was necessary to treat or prevent imminent or life-threatening deterioration.  Critical care was time spent personally by me on the following activities: development of treatment plan with patient  and/or surrogate as well as nursing, discussions with consultants, evaluation of patient's response to treatment, examination of patient, obtaining history from patient or surrogate, ordering and performing treatments and interventions, ordering and review of laboratory studies, ordering and review of radiographic studies, pulse oximetry and re-evaluation of patient's condition.  Medications Ordered in ED Medications - No data to display   Initial Impression / Assessment and Plan / ED Course  I have reviewed the triage vital signs and the nursing notes.  Pertinent labs & imaging results that were available during my care of the patient were reviewed by me and considered in my medical  decision making (see chart for details).   Patient to ED for c/o chest pain associated with left arm heaviness, SOB, diaphoresis and nausea that started last night. Received NTG x 1 in route with improvement. Declined pain medication on arrival, but requested something to "calm her down" only.  Chart reviewed:  Last cardiac procedure:   Diagnostic Studies 11/2006 Cath  FINDINGS: Left ventriculography showed good left ventricular systolic  function, EF of 55-60%. The left main was patent. LAD has 10-15% mid  stenosis and 30-40% distal diffuse stenosis. Diagonal one is small  which is patent. Diagonal 2 is very small which is patent. The left  circumflex is patent. High OM-1 has 10-15%  midstenosis. OM-2 is very, very small. OM-3 and OM-4 are small which  are patent. The RCA is patent. PDA is small. Posterolateral branches  are also very small which are patent. The patient tolerated the  procedure well. There were no complications. The patient was  transferred to recovery room in stable condition.  Pre-surgical (ortho) cardiac clearance evaluation: 09/2013 Patient declined stress testing in favor of follow up with her cardiology ([place]). No further notes from cardiology. Patient reports she saw Dr. Montez Morita for the pre-surgical approval and surgery was done. She has seen a cardiologist once in [place] since but denies any further testing.   Re-evaluatoin: the patient is starting to have mild pain again. Medication ordered. Will re-evaluate.  The patient is seen and evaluated with Dr. Zenia Resides who feels, given the patient's history and risk factors, she will need admission for rule out ACS, and likely provacative testing to determine source of chest pain. No tachycardia, hypoxia - doubt PE. No fever, cough, neg CXR - infection unlikely.   EKG nonacute. Troponin x 1 negative (0.03). Labs otherwise stable. Hospitalist paged for admission.  Final Clinical Impressions(s) / ED Diagnoses   Final  diagnoses:  None   1. NSTEMI  ED Discharge Orders    None       Charlann Lange, Hershal Coria 05/07/18 0331    Lacretia Leigh, MD 05/07/18 [phone]

## 2018-05-06 ENCOUNTER — Encounter (HOSPITAL_COMMUNITY): Payer: Self-pay | Admitting: General Practice

## 2018-05-06 DIAGNOSIS — I10 Essential (primary) hypertension: Secondary | ICD-10-CM

## 2018-05-06 DIAGNOSIS — R079 Chest pain, unspecified: Secondary | ICD-10-CM

## 2018-05-06 DIAGNOSIS — E119 Type 2 diabetes mellitus without complications: Secondary | ICD-10-CM | POA: Diagnosis not present

## 2018-05-06 DIAGNOSIS — I25110 Atherosclerotic heart disease of native coronary artery with unstable angina pectoris: Secondary | ICD-10-CM | POA: Diagnosis not present

## 2018-05-06 DIAGNOSIS — I25119 Atherosclerotic heart disease of native coronary artery with unspecified angina pectoris: Secondary | ICD-10-CM

## 2018-05-06 DIAGNOSIS — E785 Hyperlipidemia, unspecified: Secondary | ICD-10-CM

## 2018-05-06 DIAGNOSIS — G8929 Other chronic pain: Secondary | ICD-10-CM | POA: Diagnosis not present

## 2018-05-06 LAB — TSH: TSH: 6.946 u[IU]/mL — ABNORMAL HIGH (ref 0.350–4.500)

## 2018-05-06 LAB — CBC WITH DIFFERENTIAL/PLATELET
Abs Immature Granulocytes: 0.03 10*3/uL (ref 0.00–0.07)
BASOS PCT: 0 %
Basophils Absolute: 0 10*3/uL (ref 0.0–0.1)
Eosinophils Absolute: 0.2 10*3/uL (ref 0.0–0.5)
Eosinophils Relative: 2 %
HCT: 40.8 % (ref 36.0–46.0)
Hemoglobin: 12.9 g/dL (ref 12.0–15.0)
Immature Granulocytes: 0 %
Lymphocytes Relative: 36 %
Lymphs Abs: 2.6 10*3/uL (ref 0.7–4.0)
MCH: 25.9 pg — ABNORMAL LOW (ref 26.0–34.0)
MCHC: 31.6 g/dL (ref 30.0–36.0)
MCV: 81.9 fL (ref 80.0–100.0)
Monocytes Absolute: 0.5 10*3/uL (ref 0.1–1.0)
Monocytes Relative: 6 %
Neutro Abs: 3.9 10*3/uL (ref 1.7–7.7)
Neutrophils Relative %: 56 %
PLATELETS: 440 10*3/uL — AB (ref 150–400)
RBC: 4.98 MIL/uL (ref 3.87–5.11)
RDW: 14.7 % (ref 11.5–15.5)
WBC: 7.1 10*3/uL (ref 4.0–10.5)
nRBC: 0 % (ref 0.0–0.2)

## 2018-05-06 LAB — GLUCOSE, CAPILLARY
Glucose-Capillary: 157 mg/dL — ABNORMAL HIGH (ref 70–99)
Glucose-Capillary: 139 mg/dL — ABNORMAL HIGH (ref 70–99)
Glucose-Capillary: 127 mg/dL — ABNORMAL HIGH (ref 70–99)

## 2018-05-06 LAB — BASIC METABOLIC PANEL
Anion gap: 13 (ref 5–15)
BUN: 17 mg/dL (ref 6–20)
CALCIUM: 9.6 mg/dL (ref 8.9–10.3)
CO2: 20 mmol/L — ABNORMAL LOW (ref 22–32)
Chloride: 105 mmol/L (ref 98–111)
Creatinine, Ser: 0.85 mg/dL (ref 0.44–1.00)
GFR calc Af Amer: >60 mL/min (ref >60)
Glucose, Bld: 144 mg/dL — ABNORMAL HIGH (ref 70–99)
Potassium: 3.8 mmol/L (ref 3.5–5.1)
Sodium: 138 mmol/L (ref 135–145)

## 2018-05-06 LAB — LIPID PANEL
Cholesterol: 174 mg/dL (ref 0–200)
HDL: 49 mg/dL (ref >40)
LDL Cholesterol: 109 mg/dL — ABNORMAL HIGH (ref 0–99)
Total CHOL/HDL Ratio: 3.6 RATIO
Triglycerides: 80 mg/dL (ref <150)
VLDL: 16 mg/dL (ref 0–40)

## 2018-05-06 LAB — D-DIMER, QUANTITATIVE: D-Dimer, Quant: 0.33 ug/mL-FEU (ref 0.00–0.50)

## 2018-05-06 LAB — TROPONIN I
Troponin I: <0.03 ng/mL (ref <0.03)
Troponin I: <0.03 ng/mL (ref <0.03)
Troponin I: <0.03 ng/mL (ref <0.03)

## 2018-05-06 LAB — CBG MONITORING, ED: Glucose-Capillary: 137 mg/dL — ABNORMAL HIGH (ref 70–99)

## 2018-05-06 MED ORDER — IPRATROPIUM-ALBUTEROL 0.5-2.5 (3) MG/3ML IN SOLN: 3.0000 mL | Freq: Four times a day (QID) | RESPIRATORY_TRACT | Status: DC | PRN

## 2018-05-06 MED ORDER — DIAZEPAM 5 MG PO TABS
5.0000 mg | ORAL_TABLET | Freq: Three times a day (TID) | ORAL | Status: DC
  Administered 2018-05-06 – 2018-05-09 (×6): 5 mg via ORAL
  Filled 2018-05-06 (×8): qty 1

## 2018-05-06 MED ORDER — HYDROXYZINE HCL 10 MG PO TABS
10.0000 mg | ORAL_TABLET | Freq: Every day | ORAL | Status: DC
  Administered 2018-05-06 – 2018-05-08 (×3): 10 mg via ORAL
  Filled 2018-05-06 (×2): qty 3
  Filled 2018-05-06 (×2): qty 1

## 2018-05-06 MED ORDER — LIDOCAINE VISCOUS HCL 2 % MT SOLN
15.0000 mL | Freq: Once | OROMUCOSAL | Status: AC
  Administered 2018-05-06: 15 mL via ORAL
  Filled 2018-05-06: qty 15

## 2018-05-06 MED ORDER — ONDANSETRON HCL 4 MG/2ML IJ SOLN
4.0000 mg | Freq: Four times a day (QID) | INTRAMUSCULAR | Status: DC | PRN
  Administered 2018-05-06 – 2018-05-07 (×3): 4 mg via INTRAVENOUS
  Filled 2018-05-06 (×3): qty 2

## 2018-05-06 MED ORDER — DIAZEPAM 5 MG PO TABS
5.0000 mg | ORAL_TABLET | Freq: Once | ORAL | Status: AC
  Administered 2018-05-06: 5 mg via ORAL
  Filled 2018-05-06: qty 1

## 2018-05-06 MED ORDER — FENTANYL CITRATE (PF) 100 MCG/2ML IJ SOLN
50.0000 ug | Freq: Once | INTRAMUSCULAR | Status: AC
  Administered 2018-05-06: 50 ug via INTRAVENOUS
  Filled 2018-05-06: qty 2

## 2018-05-06 MED ORDER — MECLIZINE HCL 25 MG PO TABS
25.0000 mg | ORAL_TABLET | Freq: Three times a day (TID) | ORAL | Status: DC | PRN
  Filled 2018-05-06: qty 1

## 2018-05-06 MED ORDER — OXYCODONE HCL ER 15 MG PO T12A
40.0000 mg | EXTENDED_RELEASE_TABLET | Freq: Two times a day (BID) | ORAL | Status: DC
  Administered 2018-05-06 – 2018-05-09 (×5): 40 mg via ORAL
  Filled 2018-05-06: qty 4
  Filled 2018-05-06 (×3): qty 2
  Filled 2018-05-06: qty 4
  Filled 2018-05-06 (×2): qty 2

## 2018-05-06 MED ORDER — BUDESONIDE 0.5 MG/2ML IN SUSP
0.5000 mg | Freq: Two times a day (BID) | RESPIRATORY_TRACT | Status: DC
  Administered 2018-05-06 – 2018-05-09 (×6): 0.5 mg via RESPIRATORY_TRACT
  Filled 2018-05-06 (×9): qty 2

## 2018-05-06 MED ORDER — AZITHROMYCIN 250 MG PO TABS
500.0000 mg | ORAL_TABLET | Freq: Every day | ORAL | Status: DC
  Administered 2018-05-07 – 2018-05-09 (×2): 500 mg via ORAL
  Filled 2018-05-06 (×2): qty 2

## 2018-05-06 MED ORDER — ALUM & MAG HYDROXIDE-SIMETH 200-200-20 MG/5ML PO SUSP
30.0000 mL | Freq: Once | ORAL | Status: AC
  Administered 2018-05-06: 30 mL via ORAL
  Filled 2018-05-06: qty 30

## 2018-05-06 MED ORDER — MONTELUKAST SODIUM 10 MG PO TABS
10.0000 mg | ORAL_TABLET | Freq: Every day | ORAL | Status: DC
  Administered 2018-05-06 – 2018-05-08 (×3): 10 mg via ORAL
  Filled 2018-05-06 (×4): qty 1

## 2018-05-06 MED ORDER — ACETAMINOPHEN 325 MG PO TABS
650.0000 mg | ORAL_TABLET | ORAL | Status: DC | PRN
  Administered 2018-05-07: 650 mg via ORAL
  Filled 2018-05-06: qty 2

## 2018-05-06 MED ORDER — POTASSIUM CHLORIDE CRYS ER 20 MEQ PO TBCR
20.0000 meq | EXTENDED_RELEASE_TABLET | Freq: Two times a day (BID) | ORAL | Status: DC
  Administered 2018-05-06 – 2018-05-09 (×6): 20 meq via ORAL
  Filled 2018-05-06 (×7): qty 1

## 2018-05-06 MED ORDER — CYCLOBENZAPRINE HCL 10 MG PO TABS
10.0000 mg | ORAL_TABLET | Freq: Three times a day (TID) | ORAL | Status: DC | PRN
  Administered 2018-05-06: 10 mg via ORAL
  Filled 2018-05-06: qty 1

## 2018-05-06 MED ORDER — ATORVASTATIN CALCIUM 10 MG PO TABS
10.0000 mg | ORAL_TABLET | Freq: Every day | ORAL | Status: DC
  Administered 2018-05-06 – 2018-05-07 (×2): 10 mg via ORAL
  Filled 2018-05-06 (×2): qty 1

## 2018-05-06 MED ORDER — INSULIN ASPART 100 UNIT/ML ~~LOC~~ SOLN
0.0000 [IU] | Freq: Three times a day (TID) | SUBCUTANEOUS | Status: DC
  Administered 2018-05-06: 2 [IU] via SUBCUTANEOUS
  Administered 2018-05-06: 1 [IU] via SUBCUTANEOUS
  Administered 2018-05-07: 2 [IU] via SUBCUTANEOUS
  Administered 2018-05-08 (×2): 1 [IU] via SUBCUTANEOUS
  Administered 2018-05-09: 3 [IU] via SUBCUTANEOUS
  Administered 2018-05-09: 1 [IU] via SUBCUTANEOUS

## 2018-05-06 MED ORDER — ENOXAPARIN SODIUM 40 MG/0.4ML ~~LOC~~ SOLN
40.0000 mg | Freq: Every day | SUBCUTANEOUS | Status: DC
  Administered 2018-05-06 – 2018-05-09 (×4): 40 mg via SUBCUTANEOUS
  Filled 2018-05-06 (×5): qty 0.4

## 2018-05-06 MED ORDER — NITROGLYCERIN 0.4 MG SL SUBL: 0.4000 mg | SUBLINGUAL_TABLET | SUBLINGUAL | Status: DC | PRN

## 2018-05-06 MED ORDER — HYDRALAZINE HCL 20 MG/ML IJ SOLN: 10.0000 mg | INTRAMUSCULAR | Status: DC | PRN

## 2018-05-06 MED ORDER — DULOXETINE HCL 60 MG PO CPEP
60.0000 mg | ORAL_CAPSULE | Freq: Two times a day (BID) | ORAL | Status: DC
  Administered 2018-05-06 – 2018-05-09 (×4): 60 mg via ORAL
  Filled 2018-05-06 (×7): qty 1

## 2018-05-06 MED ORDER — AMLODIPINE BESYLATE 10 MG PO TABS
10.0000 mg | ORAL_TABLET | Freq: Every day | ORAL | Status: DC
  Administered 2018-05-06 – 2018-05-09 (×3): 10 mg via ORAL
  Filled 2018-05-06 (×2): qty 1
  Filled 2018-05-06: qty 2

## 2018-05-06 MED ORDER — TRIAMTERENE-HCTZ 37.5-25 MG PO TABS
1.0000 | ORAL_TABLET | Freq: Every day | ORAL | Status: DC
  Administered 2018-05-07 – 2018-05-09 (×2): 1 via ORAL
  Filled 2018-05-06 (×4): qty 1

## 2018-05-06 MED ORDER — GABAPENTIN 400 MG PO CAPS
800.0000 mg | ORAL_CAPSULE | Freq: Three times a day (TID) | ORAL | Status: DC
  Administered 2018-05-06 – 2018-05-09 (×10): 800 mg via ORAL
  Filled 2018-05-06 (×11): qty 2

## 2018-05-06 MED ORDER — ARFORMOTEROL TARTRATE 15 MCG/2ML IN NEBU
15.0000 ug | INHALATION_SOLUTION | Freq: Two times a day (BID) | RESPIRATORY_TRACT | Status: DC
  Administered 2018-05-06 – 2018-05-09 (×6): 15 ug via RESPIRATORY_TRACT
  Filled 2018-05-06 (×9): qty 2

## 2018-05-06 MED ORDER — SODIUM CHLORIDE 0.9 % IV SOLN
Freq: Once | INTRAVENOUS | Status: AC
  Administered 2018-05-06: 09:00:00 via INTRAVENOUS

## 2018-05-06 MED ORDER — PANTOPRAZOLE SODIUM 40 MG PO TBEC
40.0000 mg | DELAYED_RELEASE_TABLET | Freq: Every day | ORAL | Status: DC
  Administered 2018-05-06 – 2018-05-09 (×3): 40 mg via ORAL
  Filled 2018-05-06 (×3): qty 1

## 2018-05-06 MED ORDER — CLONIDINE HCL 0.3 MG/24HR TD PTWK
0.3000 mg | MEDICATED_PATCH | TRANSDERMAL | Status: DC
  Administered 2018-05-08: 0.3 mg via TRANSDERMAL
  Filled 2018-05-06: qty 1

## 2018-05-06 NOTE — ED Notes (Signed)
Report called to 5C 

## 2018-05-06 NOTE — ED Notes (Signed)
Pt's CBG result was 137. Informed Michelle - RN.

## 2018-05-06 NOTE — ED Notes (Signed)
Paged [place]

## 2018-05-06 NOTE — Progress Notes (Signed)
Patient unable to wear our CPAP tonight, cannot use the nose mask for issues of feeling like they are suffocating. Explained if she changed her mind to just have the RN call and we would come set her up and try.

## 2018-05-06 NOTE — H&P (Signed)
History and Physical    Angela [name] WSF:[id number] DOB: 19-Jul-1960 DOA: 05/05/2018  Referring MD/NP/PA: Charlann Lange, PA-C PCP: Fayrene Helper, MD  Patient coming from: Home via EMS  Chief Complaint: Chest pain  I have personally briefly reviewed patient's old medical records in [place]   HPI: Angela [name] is a 58 y.o. female with medical history significant of HTN, HLD, nonobstructive CAD, DM type II, chronic pain, morbid obesity, and OSA on CPAP; who presents with complaints of chest pain.  Symptoms started around 10:45 PM 2 nights ago while laying down.  She reports having dull aching pain in her left arm and feeling that someone was standing on her chest.  She tried to get up and get some air, but symptoms persisted.  Complained of associated symptoms of diaphoresis, shortness of breath, and nausea. Her nitroglycerin was outdated.  She tried to rest without much improvement.  She reports having a previous history of 4 heart attacks, but denies any stents ever being placed.  Notes that she does not currently want to undergo any procedure due to her hypertension and diabetes.  Previously followed by Dr. Montez Morita of cardiology and had mild nonobstructive disease by cardiac cath done by Dr. Einar Gip in 2008.  She is amenable to coming in to the hospital and being evaluated and will reconsider needed further testing when that time comes.   En route with EMS patient had been given an 324 mg aspirin to chew and 1 sublingual nitroglycerin.  She reports that she should not have taken aspirin as she has allergy to aspirin due to hiatal hernia.  ED Course: Upon admission into the emergency department patient was noted to be afebrile, blood pressures 152/88-170 1/98, and all other vital signs within normal limits.  Labs revealed platelets 434, glucose 154, initial troponin negative, and all other labd relatively within normal limits. Chest x-ray showed no acute abnormalities.  Patient has been  given Zofran, 0.5 mg of Ativan, and 50 mcg of fentanyl while in the ED.  TRH called to admit for chest pain rule out.  Review of Systems  Constitutional: Positive for diaphoresis. Negative for fever and malaise/fatigue.  HENT: Negative for ear discharge and nosebleeds.   Eyes: Negative for photophobia and pain.  Respiratory: Positive for shortness of breath. Negative for cough.   Cardiovascular: Positive for chest pain. Negative for leg swelling.  Gastrointestinal: Positive for nausea. Negative for abdominal pain and vomiting.  Genitourinary: Negative for dysuria and hematuria.  Musculoskeletal: Positive for joint pain. Negative for falls and myalgias.  Skin: Positive for rash.  Neurological: Negative for focal weakness and loss of consciousness.  Psychiatric/Behavioral: Negative for substance abuse. The patient is nervous/anxious.     Past Medical History:  Diagnosis Date  . Anxiety   . Arthritis   . Asthma   . Coronary atherosclerosis of native coronary artery    Mild nonobstructive CAD by cardiac catheterization 2008 - Dr. Terrence Dupont  . Degenerative disc disease   . Depression   . Dysrhythmia   . Essential hypertension, benign   . Folliculitis 0/17/4944  . GERD (gastroesophageal reflux disease)   . H/O hiatal hernia   . Helicobacter pylori gastritis 2008  . Hypercholesterolemia   . Hypertension   . Hyperthyroidism    s/p radiation  . Low back pain   . Migraine   . Nephrolithiasis    Recurring episodes since 2004  . Severe obesity (BMI >= 40) (HCC)   . Sleep apnea  STOP BANG SCORE 6no cpap used  . Type 2 diabetes mellitus with diabetic neuropathy [facility])     Past Surgical History:  Procedure Laterality Date  . ABDOMINAL HYSTERECTOMY    . BACK SURGERY  1993  . BACK SURGERY  1994  . BACK SURGERY  2002  . BACK SURGERY  2011   [place] Left   . CHOLECYSTECTOMY  1996  . COLONOSCOPY  2000 [facility]   INT HEMORRHOIDS/FISSURE  . COLONOSCOPY  2003  [facility], CHANGE IN BOWEL HABITS   INT HEMORRHOIDS  . COLONOSCOPY  2006 [facility]   INT HEMORRHOIDS  . COLONOSCOPY  2007 [facility]   INT HEMORRHOIDS  . CYSTOSCOPY WITH STENT PLACEMENT Left 07/25/2012   Procedure: CYSTOSCOPY, left retograde pyelogram WITH left ureteral  STENT PLACEMENT;  Surgeon: Claybon Jabs, MD;  Location: [facility];  Service: Urology;  Laterality: Left;  . CYSTOSCOPY/RETROGRADE/URETEROSCOPY  12/04/2011   Procedure: CYSTOSCOPY/RETROGRADE/URETEROSCOPY;  Surgeon: Molli Hazard, MD;  Location: [facility];  Service: Urology;  Laterality: Right;  . CYSTOSCOPY/RETROGRADE/URETEROSCOPY/STONE EXTRACTION WITH BASKET Left 09/03/2012   Procedure: CYSTOSCOPY/RETROGRADE pyelogram/digital URETEROSCOPY/STONE EXTRACTION WITH BASKET, left stent removal;  Surgeon: Molli Hazard, MD;  Location: [facility];  Service: Urology;  Laterality: Left;  . ESOPHAGOGASTRODUODENOSCOPY  2008   [facility] hiatal hernia./Normal esophagus without evidence of Barrett's mass, stricture, erosion or ulceration./Normal duodenal bulb and second portion of the duodenum./Diffuse erythema in the body and the antrum with occasional erosion.  Biopsies obtained via cold forceps to evaluate for H. pylori gastritis  . FRACTURE SURGERY  1999   right clavicle  . HOLMIUM LASER APPLICATION Left 06/05/3662   Procedure: HOLMIUM LASER APPLICATION;  Surgeon: Molli Hazard, MD;  Location: [facility];  Service: Urology;  Laterality: Left;  . KNEE ARTHROSCOPY  10/04/2010,    right knee arthroscopy, dr Theda Sers  . left knee arthroscopic surgery  1999  . Left salphingectomy secondary to ectopic pregnancy  1991  . PARTIAL HYSTERECTOMY  1991  . rotary cuff  Right 05/14/2014   [facility]  . UPPER GASTROINTESTINAL ENDOSCOPY  2008 abd pain   H. pylori gastritis  . UPPER GASTROINTESTINAL ENDOSCOPY  1996 [facility] [place]   PUD     reports that she has never smoked. She has never used smokeless tobacco. She reports that she does not drink alcohol or  use drugs.  Allergies  Allergen Reactions  . Morphine Other (See Comments)    Reaction with esophagus, unable to swallow.   . Ace Inhibitors Cough  . Aspirin Other (See Comments)    Reaction with esophagus, unable to swallow.   . Losartan Cough  . Tapentadol Other (See Comments)    Nausea, increased sleepiness, h/a  . Tomato Other (See Comments)    Acid reflux due to acid in tomato  . Adhesive [Tape] Rash    Family History  Problem Relation Age of Onset  . Heart disease Mother   . Hypertension Mother   . Cancer Mother        Cervical   . Asthma Mother   . Heart attack Father   . Cancer Father        Prostate  . Colon cancer Father        DECEASED AGE 28  . Colon polyps Neg Hx     Prior to Admission medications   Medication Sig Start Date End Date Taking? Authorizing Provider  albuterol (PROAIR HFA) 108 (90 Base) MCG/ACT inhaler INHALE 2 PUFFS BY  MOUTH EVERY 6-8HRS AS NEEDED Patient taking differently: Inhale 2 puffs into the lungs every 6 (six) hours as needed for wheezing or shortness of breath.  01/09/18  Yes Fayrene Helper, MD  amLODipine (NORVASC) 10 MG tablet TAKE 1 TABLET BY MOUTH EVERY DAY Patient taking differently: Take 10 mg by mouth daily.  04/09/18  Yes Fayrene Helper, MD  atorvastatin (LIPITOR) 10 MG tablet Take 10 mg by mouth daily.   Yes [provider]  budesonide-formoterol (SYMBICORT) 160-4.5 MCG/ACT inhaler Inhale 2 puffs into the lungs 2 (two) times daily. 02/25/18  Yes Fayrene Helper, MD  cloNIDine (CATAPRES - DOSED IN MG/24 HR) 0.3 mg/24hr patch Place 1 patch (0.3 mg total) onto the skin once a week. 01/29/17  Yes Fayrene Helper, MD  cloNIDine (CATAPRES) 0.3 MG tablet TAKE ONE TABLET AT 9 PM EVERY NIGHT FOR HIGH BLOOD PRESSURE Patient taking differently: Take 0.3 mg by mouth at bedtime.  10/08/17  Yes Fayrene Helper, MD  cyclobenzaprine (FLEXERIL) 10 MG tablet TAKE 1 TABLET BY MOUTH 3 TIMES A DAY Patient taking  differently: Take 10 mg by mouth 3 (three) times daily.  01/10/18  Yes Fayrene Helper, MD  diazepam (VALIUM) 5 MG tablet One tablet three times daily for anxiety and muscle spasm Patient taking differently: Take 5 mg by mouth 3 (three) times daily.  10/08/17  Yes Fayrene Helper, MD  DULoxetine (CYMBALTA) 60 MG capsule TAKE 1 CAPSULE BY MOUTH TWICE A DAY Patient taking differently: Take 60 mg by mouth 2 (two) times daily.  01/01/18  Yes Fayrene Helper, MD  ergocalciferol (VITAMIN D2) 50000 units capsule Take 1 capsule (50,000 Units total) by mouth once a week. One capsule once weekly 10/14/17  Yes Fayrene Helper, MD  esomeprazole (NEXIUM) 40 MG capsule TAKE 1 CAPSULE (40 MG TOTAL) BY MOUTH DAILY. Patient taking differently: Take 40 mg by mouth daily.  01/01/18  Yes Fayrene Helper, MD  fluticasone (FLONASE) 50 MCG/ACT nasal spray Place 2 sprays into both nostrils daily. Patient taking differently: Place 2 sprays into both nostrils daily as needed for allergies.  07/08/15  Yes Fayrene Helper, MD  gabapentin (NEURONTIN) 800 MG tablet TAKE 1 TABLET BY MOUTH 4 TIMES A DAY Patient taking differently: Take 800 mg by mouth 4 (four) times daily.  11/28/17  Yes Fayrene Helper, MD  hydrOXYzine (ATARAX/VISTARIL) 10 MG tablet Take 10-30 mg by mouth at bedtime.  01/06/18  Yes [provider]  ipratropium-albuterol (DUONEB) 0.5-2.5 (3) MG/3ML SOLN Take 3 mLs by nebulization every 6 (six) hours as needed. Patient taking differently: Take 3 mLs by nebulization every 6 (six) hours as needed (SOB).  07/27/15  Yes Fayrene Helper, MD  KLOR-CON M20 20 MEQ tablet TAKE 1 TABLET (20 MEQ TOTAL) BY MOUTH 2 (TWO) TIMES DAILY. Patient taking differently: Take 20 mEq by mouth 2 (two) times daily.  02/06/18  Yes Fayrene Helper, MD  loratadine (CLARITIN) 10 MG tablet Take 1 tablet (10 mg total) by mouth daily. 08/03/17  Yes Fayrene Helper, MD  meclizine (ANTIVERT) 25 MG tablet Take 25 mg  by mouth 3 (three) times daily as needed for dizziness.   Yes [provider]  metFORMIN (GLUCOPHAGE) 1000 MG tablet TAKE 1 TABLET BY MOUTH TWICE A DAY WITH FOOD Patient taking differently: Take 1,000 mg by mouth 2 (two) times daily.  12/28/17  Yes Fayrene Helper, MD  montelukast (SINGULAIR) 10 MG tablet TAKE 1  TABLET (10 MG TOTAL) BY MOUTH AT BEDTIME. 04/05/16  Yes Fayrene Helper, MD  NITROSTAT 0.4 MG SL tablet Place 0.4 mg under the tongue every 5 (five) minutes as needed for chest pain.  09/30/14  Yes [provider]  oxyCODONE (OXYCONTIN) 40 mg 12 hr tablet Take 1 tablet (40 mg total) by mouth every 12 (twelve) hours. 04/08/18 05/08/18 Yes Fayrene Helper, MD  triamterene-hydrochlorothiazide (MAXZIDE) 75-50 MG tablet TAKE 1 TABLET BY MOUTH DAILY. Patient taking differently: Take 1 tablet by mouth daily.  10/02/17  Yes Fayrene Helper, MD  zolpidem (AMBIEN) 10 MG tablet Take 1 tablet (10 mg total) by mouth at bedtime. 03/09/18 05/05/18 Yes Fayrene Helper, MD  azelastine (ASTELIN) 0.1 % nasal spray Place 2 sprays into both nostrils 2 (two) times daily. Use in each nostril as directed Patient not taking: Reported on 05/05/2018 11/06/16   Fayrene Helper, MD  diazepam (VALIUM) 5 MG tablet Take one tablet by mouth three times daily for anxiety and back spasm Patient not taking: Reported on 05/05/2018 04/16/18 04/17/19  Fayrene Helper, MD  fluconazole (DIFLUCAN) 150 MG tablet ONE TABLET ONCE DAILY FOR 2 DAYS Patient not taking: Reported on 05/05/2018 05/23/17   Raylene Everts, MD  furosemide (LASIX) 40 MG tablet TAKE 1 TABLET BY MOUTH TWICE A DAY Patient not taking: Reported on 05/05/2018 03/01/15   Fayrene Helper, MD  hydrocortisone (ANUSOL-HC) 2.5 % rectal cream Place 1 application rectally 4 (four) times daily. FOR 12 DAYS Patient not taking: Reported on 05/05/2018 09/26/16   Danie Binder, MD  triamcinolone cream (KENALOG) 0.1 % Apply 1 application topically 2  (two) times daily. Patient not taking: Reported on 05/05/2018 02/26/15   Tanna Furry, MD  losartan-hydrochlorothiazide [facility]) 50-12.5 MG per tablet TAKE 1 TABLET BY MOUTH ONCE DAILY *REPLACES DIOVAN/HCTZ* 04/20/11 07/11/11  Fayrene Helper, MD    Physical Exam:  Constitutional: Obese middle-aged female appears to be in no acute distress Vitals:   05/05/18 2153 05/05/18 2156 05/05/18 2315 05/05/18 2330  BP:   (!) 171/98 (!) 152/88  Pulse:  79 74 81  Resp:  16 15 15   Temp:      TempSrc:      SpO2:  97% 97% 99%  Weight: (!) 140.6 kg     Height: 5\' 4"  (1.626 m)      Eyes: PERRL, lids and conjunctivae normal ENMT: Mucous membranes are moist. Posterior pharynx clear of any exudate or lesions. Neck: normal, supple, no masses, no thyromegaly Respiratory: Mildly decreased aeration, but otherwise clear to auscultation.  Regular effort.  Maintaining O2 sats on room air. Cardiovascular: Regular rate and rhythm, no murmurs / rubs / gallops. No extremity edema. 2+ pedal pulses. No carotid bruits.  Abdomen: no tenderness, no masses palpated. No hepatosplenomegaly. Bowel sounds positive.  Musculoskeletal: no clubbing / cyanosis. No joint deformity upper and lower extremities. Good ROM, no contractures. Normal muscle tone.  Skin: Psoriasis plaques of the anterior shins of the bilateral lower extremities appreciated.  Left lower extremity with excoriations from scratching present. Neurologic: CN 2-12 grossly intact. Sensation intact, DTR normal. Strength 5/5 in all 4.  Psychiatric: Normal judgment and insight. Alert and oriented x 3. Normal mood.     Labs on Admission: I have personally reviewed following labs and imaging studies  CBC: Recent Labs  Lab 05/05/18 2250  WBC 6.8  NEUTROABS 3.4  HGB 13.1  HCT 41.3  MCV 81.9  PLT 434*  Basic Metabolic Panel: Recent Labs  Lab 05/05/18 2250  NA 136  K 3.5  CL 99  CO2 24  GLUCOSE 154*  BUN 17  CREATININE 0.99  CALCIUM 9.6    GFR: Estimated Creatinine Clearance: 87.1 mL/min (by C-G formula based on SCr of 0.99 mg/dL). Liver Function Tests: Recent Labs  Lab 05/05/18 2250  AST 19  ALT 24  ALKPHOS 63  BILITOT 0.6  PROT 8.0  ALBUMIN 3.9   No results for input(s): LIPASE, AMYLASE in the last 168 hours. No results for input(s): AMMONIA in the last 168 hours. Coagulation Profile: No results for input(s): INR, PROTIME in the last 168 hours. Cardiac Enzymes: Recent Labs  Lab 05/05/18 2250  TROPONINI <0.03   BNP (last 3 results) No results for input(s): PROBNP in the last 8760 hours. HbA1C: No results for input(s): HGBA1C in the last 72 hours. CBG: No results for input(s): GLUCAP in the last 168 hours. Lipid Profile: No results for input(s): CHOL, HDL, LDLCALC, TRIG, CHOLHDL, LDLDIRECT in the last 72 hours. Thyroid Function Tests: No results for input(s): TSH, T4TOTAL, FREET4, T3FREE, THYROIDAB in the last 72 hours. Anemia Panel: No results for input(s): VITAMINB12, FOLATE, FERRITIN, TIBC, IRON, RETICCTPCT in the last 72 hours. Urine analysis:    Component Value Date/Time   COLORURINE YELLOW 07/24/2017 0732   APPEARANCEUR CLEAR 07/24/2017 0732   LABSPEC 1.023 07/24/2017 0732   PHURINE 5.0 07/24/2017 0732   GLUCOSEU NEGATIVE 07/24/2017 0732   HGBUR NEGATIVE 07/24/2017 0732   HGBUR trace-lysed 01/01/2008 0831   BILIRUBINUR NEGATIVE 07/24/2017 0732   BILIRUBINUR Negative 07/12/2017 1409   KETONESUR NEGATIVE 07/24/2017 0732   PROTEINUR 1+ 08/15/2017 1517   PROTEINUR NEGATIVE 07/24/2017 0732   UROBILINOGEN 1.0 07/12/2017 1409   UROBILINOGEN 0.2 07/13/2013 2031   NITRITE neg 08/15/2017 1517   NITRITE NEGATIVE 07/24/2017 0732   LEUKOCYTESUR Negative 08/15/2017 1517   Sepsis Labs: No results found for this or any previous visit (from the past 240 hour(s)).   Radiological Exams on Admission: Dg Chest Portable 1 View  Result Date: 05/05/2018 CLINICAL DATA:  Nonradiating chest pain beginning  last night EXAM: PORTABLE CHEST 1 VIEW COMPARISON:  07/13/2013 FINDINGS: Stable cardiomegaly with mild uncoiling of the thoracic aorta. No acute pulmonary consolidation, effusion or pneumothorax. Resected appearance of the distal right clavicle with mild AC joint osteoarthritis on the left. IMPRESSION: No active disease.  Stable mild cardiomegaly. Electronically Signed   By: Ashley Royalty M.D.   On: 05/05/2018 23:00    EKG: Independently reviewed.  Sinus rhythm at 76 bpm  Assessment/Plan Chest pain: Patient presents with complaints of acute chest pain with radiation to her left arm.  Initial troponin EKG showed no significant signs of ischemia.  Previous report of mild nonobstructive disease by cardiac cath in 2008 by Dr. Einar Gip.  Risk factors include hypertension, diabetes, morbid obesity. - Admit to a cardiac telemetry bed - Trend cardiac troponin - Nitroglycerin as needed - Check lipid panel - IV fluids normal saline at 75 mL/h  CAD: Previous in cath as seen above.  Patient reports allergy to aspirin.  Essential hypertension: Blood pressures appear elevated up to 171/98 on admission. - Continue amlodipine, clonidine patch, and triamterene -hydrochlorothiazide   Hyperthyroidism: Patient with previous history of radiation.  Last TSH noted to be elevated at 7.22 on 10/08/2017. - Check TSH  Diabetes mellitus type 2: Patient currently on  metformin. Initial blood glucose 154 with her last hemoglobin A1c being 7.4 on 02/06/2018. -  Hypoglycemic protocol - Hold metformin - CBGs q. before meals with sensitive SSI  Chronic pain - Continue Cymbalta, oxycodone, gabapentin  Anxiety - Continue Valium as needed for anxiety  Hyperlipidemia - Follow-up lipid panel - Continue Lipitor  Psoriasis - Continue hydroxyzine  Morbid obese: BMI 53.2  Thrombocytosis: Initial platelet count mildly elevated at 434 admission. - Continue to monitor  DVT prophylaxis: lovenox Code Status: Full Family  Communication: Discussed plan of care with the patient family present at bedside Disposition Plan: To be discharged home if work-up negative Consults called: none Admission status: observation  Norval Morton MD [facility] Pager [phone]   If 7PM-7AM, please contact night-coverage [email or web address] Password [facility]  05/06/2018, 12:39 AM

## 2018-05-06 NOTE — Progress Notes (Addendum)
Patient admitted after midnight, please see H&P.  Here with chest pain/SOB.  Last cath was 2008 and had non-obstructive CAD.  She is also having hemoptysis-- get d dimer and CTA if positive.  Pain has been on and off since Saturday and CE are negative Monitor on tele. Reports hiatal hernia -TSH slowly improving  Eulogio Bear DO   d dimer negative so doubt hemoptysis due to PE.  Add azithromycin for bronchitis-- has been coughing.  NPO after midnight to discuss with cardiology in AM (CP protocol)

## 2018-05-07 ENCOUNTER — Observation Stay (HOSPITAL_BASED_OUTPATIENT_CLINIC_OR_DEPARTMENT_OTHER): Payer: Medicare Other

## 2018-05-07 ENCOUNTER — Other Ambulatory Visit (HOSPITAL_COMMUNITY): Payer: Self-pay

## 2018-05-07 DIAGNOSIS — R079 Chest pain, unspecified: Secondary | ICD-10-CM | POA: Diagnosis not present

## 2018-05-07 DIAGNOSIS — E119 Type 2 diabetes mellitus without complications: Secondary | ICD-10-CM | POA: Diagnosis not present

## 2018-05-07 DIAGNOSIS — I340 Nonrheumatic mitral (valve) insufficiency: Secondary | ICD-10-CM | POA: Diagnosis not present

## 2018-05-07 DIAGNOSIS — G8929 Other chronic pain: Secondary | ICD-10-CM | POA: Diagnosis not present

## 2018-05-07 DIAGNOSIS — I25119 Atherosclerotic heart disease of native coronary artery with unspecified angina pectoris: Secondary | ICD-10-CM | POA: Diagnosis not present

## 2018-05-07 DIAGNOSIS — I200 Unstable angina: Secondary | ICD-10-CM | POA: Diagnosis not present

## 2018-05-07 LAB — ECHOCARDIOGRAM COMPLETE
Height: 64 in
Weight: 4960 oz

## 2018-05-07 LAB — GLUCOSE, CAPILLARY
GLUCOSE-CAPILLARY: 184 mg/dL — AB (ref 70–99)
GLUCOSE-CAPILLARY: 189 mg/dL — AB (ref 70–99)
Glucose-Capillary: 135 mg/dL — ABNORMAL HIGH (ref 70–99)
Glucose-Capillary: 99 mg/dL (ref 70–99)

## 2018-05-07 MED ORDER — METOPROLOL TARTRATE 12.5 MG HALF TABLET
12.5000 mg | ORAL_TABLET | Freq: Two times a day (BID) | ORAL | Status: DC
  Administered 2018-05-07 – 2018-05-09 (×4): 12.5 mg via ORAL
  Filled 2018-05-07 (×4): qty 1

## 2018-05-07 MED ORDER — METOCLOPRAMIDE HCL 5 MG/ML IJ SOLN
5.0000 mg | Freq: Once | INTRAMUSCULAR | Status: AC
  Administered 2018-05-07: 5 mg via INTRAVENOUS
  Filled 2018-05-07: qty 2

## 2018-05-07 MED ORDER — SODIUM CHLORIDE 0.9% FLUSH: 3.0000 mL | INTRAVENOUS | Status: DC | PRN

## 2018-05-07 MED ORDER — SODIUM CHLORIDE 0.9 % WEIGHT BASED INFUSION
3.0000 mL/kg/h | INTRAVENOUS | Status: DC
  Administered 2018-05-08: 3 mL/kg/h via INTRAVENOUS

## 2018-05-07 MED ORDER — SODIUM CHLORIDE 0.9 % IV SOLN: 250.0000 mL | INTRAVENOUS | Status: DC | PRN

## 2018-05-07 MED ORDER — SODIUM CHLORIDE 0.9% FLUSH
3.0000 mL | Freq: Two times a day (BID) | INTRAVENOUS | Status: DC
  Administered 2018-05-08: 3 mL via INTRAVENOUS

## 2018-05-07 MED ORDER — SODIUM CHLORIDE 0.9 % WEIGHT BASED INFUSION: 1.0000 mL/kg/h | INTRAVENOUS | Status: DC

## 2018-05-07 NOTE — Care Management Obs Status (Signed)
[place] NOTIFICATION   Patient Details  Name: Angela [name] MRN: [id number] Date of Birth: 03/31/1961   Medicare Observation Status Notification Given:  Yes    Midge Minium RN, BSN, NCM-BC, ACM-RN [phone] 05/07/2018, 11:17 AM

## 2018-05-07 NOTE — H&P (View-Only) (Signed)
Cardiology Consult    [name] MRN: [id number], DOB/AGE: October 29, 1960   Admit date: 05/05/2018 Date of Consult: 05/07/2018  Primary Physician: Fayrene Helper, MD Primary Cardiologist: No primary care provider on file. Requesting Provider: Geradine Girt, DO  Patient Angela [name] is an obese 58 y.o. African-American female with a history of mild non-obstructive CAD on cardiac catheterization in 2008, hypertension, hyperlipidemia, type 2 diabetes mellitus, obstructive sleep apnea not on CPAP, who is being seen today for the evaluation of chest pain at the request of Dr. Eliseo Squires.  History of Present Illness    Angela [name] is an obese 58 year old African-American female with the above history who was previously seen by Dr. Harl Bowie. Patient first saw Dr. Harl Bowie in March 2015 for evaluation of chest pain with known non-obstructive CAD on previous cardiac catheterization. At that time she reported episodes of chest pain at rest and with exertion with increased dyspnea on exertion and lower extremity edema. An Echo was ordered which showed LVEF of 60-65% with mild LVH and grade 1 diastolic dysfunction but no wall motion abnormalities. Patient was seen again in June 2015 for a pre-operative evaluation for left knee surgery. Her functional capacity was unable to be assessed by history due to chronic knee and back pain and thus a stress test was recommended. However, patient was not interested in undergoing a stress test and wanted to follow-up with previous Cardiologist Dr. Montez Morita for further evaluation.   Patient was in her usual state of health until the evening of 05/04/2018 when she developed sudden onset left shoulder dullness. Patient also report sudden onset of chest pain around the same time that she describes as a heaviness "like someone was sitting on her chest." She ranks the pain as a 9/10 on the pain scale and states it would come and go last for 5-10 minutes at a  time. Associated symptoms include nausea, diaphoresis, and palpitations. Patient was not able to sleep well that night and woke up on 05/05/2018 not feeling well. Symptoms persisted throughout the day so patient eventually called 911 and EMS transported patient to [facility] ED. Patient denies any recent exertional chest pain or shortness of breath but patient is very sedentary and states she just sits in a recliner all day. She does not palpitations with exertion and attributes that to her weight. Patient states she has been diagnosed with a dysrhythmia before where her heart rate will suddenly increase and start "fluttering" and then return to normal. She has never been told she needs anticoagulation, so this sounds like an SVT type of arrhythmia. She also reports orthopnea and states she sleeps on 6 pillows which she has done for a while now. No PND or significant lower extremity edema. No recent fevers or illnesses.  Patient received Aspirin and one Nitroglycerin in route to the ED with improvement of her symptoms. Upon arrival to the ED, patient hypertensive but vitals stable. EKG showed normal sinus rhythm with no acute ischemic changes. Initial troponin negative. Chest x-ray showed mild cardiomegaly but no acute findings. D-dimer negative. WBC 6.8, Hgb 13.1, Plts 434. Na 136, K 3.5, Glucose 154, Scr 0.99. Patient was admitted for further evaluation.   Currently, patient denies any chest pain or left shoulder pain. However, she continues to have nausea and states she vomited several times overnight.   Past Medical History   Past Medical History:  Diagnosis Date  . Anxiety   . Arthritis   .  Asthma   . Coronary atherosclerosis of native coronary artery    Mild nonobstructive CAD by cardiac catheterization 2008 - Dr. Terrence Dupont  . Degenerative disc disease   . Depression   . Dysrhythmia   . Essential hypertension, benign   . Folliculitis 10/05/3708  . GERD (gastroesophageal reflux disease)   .  H/O hiatal hernia   . Helicobacter pylori gastritis 2008  . Hypercholesterolemia   . Hypertension   . Hyperthyroidism    s/p radiation  . Low back pain   . Migraine   . Nephrolithiasis    Recurring episodes since 2004  . Severe obesity (BMI >= 40) (HCC)   . Sleep apnea    STOP BANG SCORE 6no cpap used  . Type 2 diabetes mellitus with diabetic neuropathy [facility])     Past Surgical History:  Procedure Laterality Date  . ABDOMINAL HYSTERECTOMY    . BACK SURGERY  1993  . BACK SURGERY  1994  . BACK SURGERY  2002  . BACK SURGERY  2011   [place] Left   . CHOLECYSTECTOMY  1996  . COLONOSCOPY  2000 [facility]   INT HEMORRHOIDS/FISSURE  . COLONOSCOPY  2003 [facility], CHANGE IN BOWEL HABITS   INT HEMORRHOIDS  . COLONOSCOPY  2006 [facility]   INT HEMORRHOIDS  . COLONOSCOPY  2007 [facility]   INT HEMORRHOIDS  . CYSTOSCOPY WITH STENT PLACEMENT Left 07/25/2012   Procedure: CYSTOSCOPY, left retograde pyelogram WITH left ureteral  STENT PLACEMENT;  Surgeon: Claybon Jabs, MD;  Location: [facility];  Service: Urology;  Laterality: Left;  . CYSTOSCOPY/RETROGRADE/URETEROSCOPY  12/04/2011   Procedure: CYSTOSCOPY/RETROGRADE/URETEROSCOPY;  Surgeon: Molli Hazard, MD;  Location: [facility];  Service: Urology;  Laterality: Right;  . CYSTOSCOPY/RETROGRADE/URETEROSCOPY/STONE EXTRACTION WITH BASKET Left 09/03/2012   Procedure: CYSTOSCOPY/RETROGRADE pyelogram/digital URETEROSCOPY/STONE EXTRACTION WITH BASKET, left stent removal;  Surgeon: Molli Hazard, MD;  Location: [facility];  Service: Urology;  Laterality: Left;  . ESOPHAGOGASTRODUODENOSCOPY  2008   GYI:RSWNI hiatal hernia./Normal esophagus without evidence of Barrett's mass, stricture, erosion or ulceration./Normal duodenal bulb and second portion of the duodenum./Diffuse erythema in the body and the antrum with occasional erosion.  Biopsies obtained via cold forceps to evaluate for H. pylori gastritis  . FRACTURE SURGERY  1999   right  clavicle  . HOLMIUM LASER APPLICATION Left 09/30/7033   Procedure: HOLMIUM LASER APPLICATION;  Surgeon: Molli Hazard, MD;  Location: [facility];  Service: Urology;  Laterality: Left;  . KNEE ARTHROSCOPY  10/04/2010,    right knee arthroscopy, dr Theda Sers  . left knee arthroscopic surgery  1999  . Left salphingectomy secondary to ectopic pregnancy  1991  . PARTIAL HYSTERECTOMY  1991  . rotary cuff  Right 05/14/2014   [facility]  . UPPER GASTROINTESTINAL ENDOSCOPY  2008 abd pain   H. pylori gastritis  . UPPER GASTROINTESTINAL ENDOSCOPY  1996 [place] [place]   PUD     Allergies  Allergies  Allergen Reactions  . Morphine Other (See Comments)    Reaction with esophagus, unable to swallow.   . Ace Inhibitors Cough  . Aspirin Other (See Comments)    Reaction with esophagus, unable to swallow.   . Losartan Cough  . Tapentadol Other (See Comments)    Nausea, increased sleepiness, h/a  . Tomato Other (See Comments)    Acid reflux due to acid in tomato  . Adhesive [Tape] Rash    Inpatient Medications    . amLODipine  10 mg Oral Daily  .  arformoterol  15 mcg Nebulization BID  . atorvastatin  10 mg Oral Daily  . azithromycin  500 mg Oral Daily  . budesonide (PULMICORT) nebulizer solution  0.5 mg Nebulization BID  . [START ON 05/08/2018] cloNIDine  0.3 mg Transdermal Q Wed  . diazepam  5 mg Oral TID  . DULoxetine  60 mg Oral BID  . enoxaparin (LOVENOX) injection  40 mg Subcutaneous Daily  . gabapentin  800 mg Oral TID AC & HS  . hydrOXYzine  10-30 mg Oral QHS  . insulin aspart  0-9 Units Subcutaneous TID WC  . montelukast  10 mg Oral QHS  . oxyCODONE  40 mg Oral Q12H  . pantoprazole  40 mg Oral Daily  . potassium chloride SA  20 mEq Oral BID  . triamterene-hydrochlorothiazide  1 tablet Oral Daily    Family History    Family History  Problem Relation Age of Onset  . Heart disease Mother   . Hypertension Mother   . Cancer Mother        Cervical   . Asthma Mother   . Heart  attack Father   . Cancer Father        Prostate  . Colon cancer Father        DECEASED AGE 30  . Colon polyps Neg Hx    She indicated that her mother is deceased. She indicated that her father is deceased. She indicated that all of her four sisters are alive. She indicated that all of her three brothers are alive. She indicated that her maternal grandmother is deceased. She indicated that her maternal grandfather is deceased. She indicated that her paternal grandmother is deceased. She indicated that her paternal grandfather is deceased. She indicated that her daughter is alive. She indicated that the status of her neg hx is unknown.   Social History    Social History   Socioeconomic History  . Marital status: Single    Spouse name: Not on file  . Number of children: 1  . Years of education: 84  . Highest education level: 12th grade  Occupational History  . Occupation: Disable   Social Needs  . Financial resource strain: Not hard at all  . Food insecurity:    Worry: Never true    Inability: Never true  . Transportation needs:    Medical: No    Non-medical: No  Tobacco Use  . Smoking status: Never Smoker  . Smokeless tobacco: Never Used  Substance and Sexual Activity  . Alcohol use: No  . Drug use: No  . Sexual activity: Not Currently    Partners: Male    Birth control/protection: Surgical    Comment: hyst  Lifestyle  . Physical activity:    Days per week: 7 days    Minutes per session: 20 min  . Stress: Only a little  Relationships  . Social connections:    Talks on phone: More than three times a week    Gets together: Once a week    Attends religious service: 1 to 4 times per year    Active member of club or organization: Yes    Attends meetings of clubs or organizations: Never    Relationship status: Never married  . Intimate partner violence:    Fear of current or ex partner: No    Emotionally abused: No    Physically abused: No    Forced sexual activity:  No  Other Topics Concern  . Not on file  Social  History Narrative   Lives alone      Review of Systems    Review of Systems  Constitutional: Positive for diaphoresis. Negative for chills and fever.  HENT: Negative for congestion and sore throat.   Eyes: Negative for blurred vision and double vision.  Respiratory: Positive for shortness of breath. Negative for cough, hemoptysis and sputum production.   Cardiovascular: Positive for chest pain, palpitations and orthopnea. Negative for leg swelling and PND.  Gastrointestinal: Positive for nausea and vomiting. Negative for blood in stool.  Genitourinary: Negative for hematuria.  Musculoskeletal: Positive for back pain (chronic back pain). Negative for myalgias.  Neurological: Negative for dizziness.  Endo/Heme/Allergies: Does not bruise/bleed easily.  Psychiatric/Behavioral: Negative for substance abuse.    Physical Exam    Blood pressure (!) 128/58, pulse 68, temperature 98.1 F (36.7 C), temperature source Oral, resp. rate 20, height 5\' 4"  (1.626 m), weight (!) 140.6 kg, SpO2 95 %.  General: 58 y.o. morbidly obese African-American female resting comfortably in no acute distress. Pleasant and cooperative. HEENT: Normal  Neck: Supple. No carotid bruits or JVD appreciated. Lungs: No increased work of breathing. Clear to auscultation bilaterally. No wheezes, rhonchi, or rales. Heart: RRR. Distinct S1 and S2. No murmurs, gallops, or rubs.  Abdomen: Soft, obese, and non-tender to palpation. Bowel sounds present.   Extremities: No significant lower extremity swelling. Radial and distal pedal pulses 2+ and equal bilaterally. Skin: Warm and dry. Areas of hyperpigmentation noted on bilateral shins with white scaly plaque on left shin consistent with psoriasis.  Neuro: Alert and oriented x3. No focal deficits. Moves all extremities spontaneously. Psych: Normal affect.  Labs    Troponin (Point of Care Test) No results for input(s): TROPIPOC  in the last 72 hours. Recent Labs    05/05/18 2250 05/06/18 0112 05/06/18 0335 05/06/18 0638  TROPONINI <0.03 <0.03 <0.03 <0.03   Lab Results  Component Value Date   WBC 7.1 05/06/2018   HGB 12.9 05/06/2018   HCT 40.8 05/06/2018   MCV 81.9 05/06/2018   PLT 440 (H) 05/06/2018    Recent Labs  Lab 05/05/18 2250 05/06/18 0335  NA 136 138  K 3.5 3.8  CL 99 105  CO2 24 20*  BUN 17 17  CREATININE 0.99 0.85  CALCIUM 9.6 9.6  PROT 8.0  --   BILITOT 0.6  --   ALKPHOS 63  --   ALT 24  --   AST 19  --   GLUCOSE 154* 144*   Lab Results  Component Value Date   CHOL 174 05/06/2018   HDL 49 05/06/2018   LDLCALC 109 (H) 05/06/2018   TRIG 80 05/06/2018   Lab Results  Component Value Date   DDIMER 0.33 05/06/2018     Radiology Studies    Dg Chest Portable 1 View  Result Date: 05/05/2018 CLINICAL DATA:  Nonradiating chest pain beginning last night EXAM: PORTABLE CHEST 1 VIEW COMPARISON:  07/13/2013 FINDINGS: Stable cardiomegaly with mild uncoiling of the thoracic aorta. No acute pulmonary consolidation, effusion or pneumothorax. Resected appearance of the distal right clavicle with mild AC joint osteoarthritis on the left. IMPRESSION: No active disease.  Stable mild cardiomegaly. Electronically Signed   By: Ashley Royalty M.D.   On: 05/05/2018 23:00    EKG     EKG: EKG was personally reviewed and demonstrates: Normal sinus rhythm , rate 76 bpm, with poor R wave progression but no acute ischemic changes.   Telemetry: Telemetry was personally reviewed and demonstrates: Sinus  rhythm with heart rates ranging from 70's to 110's with some PACs/PVCs.  Cardiac Imaging    Echocardiogram 01/05/2015: Study Conclusions: - Left ventricle: The cavity size was normal. Systolic function was   normal. The estimated ejection fraction was in the range of 60%   to 65%. Wall motion was normal; there were no regional wall   motion abnormalities. Diastolic dysfunction, grade indeterminate.    Mildly elevated filling pressures. Mild to moderate concentric   left ventricular hypertrophy.  Assessment & Plan    Unstable Angina - Patient presented with substernal chest pain and left shoulder dullness at rest.  - Most recent cardiac catheterization in 2008 showed mild non-obstructive CAD. - EKG showed no acute ischemic changes.  - Troponin negative x3. - D-dimer negative. - Will check Echo. - Patient denies any chest pain at this time but is currently nauseous and reports multiple episodes of emesis overnight. - Patient has multiple cardiovascular risk factors (HTN, HLD, T2DM, morbid obesity) and may benefit from a cardiac catheterization for further ischemic evaluation. Patient is NPO at this time. Will discuss with MD.   Hypertension - Most recent BP 128/58. - Continue home medications. - Will likely add beta-blocker following potential cardiac catheterization.   Hyperlipidemia - Lipid panel this admission: Cholesterol 174, Triglycerides 80, HDL 49, LDL 109. - Currently on Lipitor 10mg  daily at home. Will likely need to increase dose to 40-80mg .   Type 2 Diabetes Mellitus  - Per primary team.  Signed, Darreld Mclean, PA-C 05/07/2018, 8:45 AM  For questions or updates, please contact   Please consult [email or web address] for contact info under Cardiology/STEMI.  ---------------------------------------------------------------------------------------------   History and all data above reviewed.  Patient examined.  I agree with the findings as above.  Angela [name] is a pleasant 58 year old female with a history of nonobstructive coronary artery disease who presents today with an episode of chest pressure and left arm pain overnight.  She has ruled out for myocardial infarction with negative troponins and an unremarkable ECG.  This is the first episode of chest pain she has had in many years per her report.  Constitutional: No acute distress Eyes: pupils equally round and  reactive to light, sclera non-icteric, normal conjunctiva and lids ENMT: normal dentition, moist mucous membranes Cardiovascular: regular rhythm, normal rate, no murmurs. S1 and S2 normal. Radial pulses normal bilaterally. No jugular venous distention.  Respiratory: clear to auscultation bilaterally GI : normal bowel sounds, soft and nontender. No distention.   MSK: extremities warm, well perfused. No edema.  NEURO: grossly nonfocal exam, moves all extremities. PSYCH: alert and oriented x 3, normal mood and affect.   All available labs, radiology testing, previous records reviewed. Agree with documented assessment and plan of my colleague as stated above with the following additions or changes:  Principal Problem:   Chest pain Active Problems:   Diabetes mellitus without complication ([place])   Hyperlipidemia LDL goal <70   Morbid obesity ([place])   Essential hypertension   Coronary atherosclerosis of native coronary artery   Chronic pain    Plan: I am concerned about unstable angina and I have recommended coronary angiography to the patient.  However the patient is extremely hesitant to proceed with coronary angiography.  I spent approximately 30 minutes discussing with the patient the indications for coronary angiography, reasons for angiography in the setting of unstable angina, indications for PCI, and risks and benefits of coronary angiography plus minus PCI.  After our thorough discussion I have answered her questions  to the best of my ability.  The patient remains extremely hesitant to proceed with coronary angiogram angiography and states that it will make her blood pressure "shoot through the roof ".  We discussed that I am concerned about progression of her known nonobstructive coronary artery disease, and she tells me that she had a "45%" blockage at that time.  We are unable to see the records from that visit.  We discussed radial approach and the decreased risk of bleeding and infection  with this access.  My concern with stress testing is that her body habitus may lead to significant artifact.  Her symptoms are concerning enough that with regard to [facility], there would not be significant incremental benefit of stress testing given that I feel she is high risk for obstructive coronary artery disease (she has known coronary artery disease that is nonobstructive).  We participated in shared decision making and determined that the patient would not like to pursue coronary angiography today.  She is seeing her primary care physician on Thursday and will see our heart care team in 2 weeks in the clinic.  In the interim I will initiate metoprolol tartrate 12.5 mg twice daily for the indication of coronary artery disease, hypertension.  With her diabetes ideally we would have her on an ACE or ARB, however these have been listed on her allergy list as causing a cough.  I have expressed concern to the patient that if she has any red flag symptoms including chest pressure chest pain, shortness of breath, dizziness, lightheadedness, or any other symptoms concerning to her she should urgently present to the emergency department.  Primary service is planning to obtain an echocardiogram prior to her dismissal, I believe this is reasonable to assess for structural issues.  If there are abnormalities on echocardiogram, I would strongly suggest the patient pursue coronary angiography.  Time Spent Directly with Patient:  I have spent a total of 75 minutes with the patient reviewing hospital notes, telemetry, EKGs, labs and examining the patient as well as establishing an assessment and plan that was discussed personally with the patient.  > 50% of time was spent in direct patient care.  Length of Stay:  LOS: 0 days   Elouise Munroe, MD [facility] 9:49 AM  05/07/2018

## 2018-05-07 NOTE — Consult Note (Addendum)
Cardiology Consult    [name] MRN: [id number], DOB/AGE: [birth year]   Admit date: 05/05/2018 Date of Consult: 05/07/2018  Primary Physician: Fayrene Helper, MD Primary Cardiologist: No primary care provider on file. Requesting Provider: Geradine Girt, DO  Patient Angela [name] is an obese 58 y.o. African-American female with a history of mild non-obstructive CAD on cardiac catheterization in 2008, hypertension, hyperlipidemia, type 2 diabetes mellitus, obstructive sleep apnea not on CPAP, who is being seen today for the evaluation of chest pain at the request of Dr. Eliseo Squires.  History of Present Illness    Angela [name] is an obese 58 year old African-American female with the above history who was previously seen by Dr. Harl Bowie. Patient first saw Dr. Harl Bowie in March 2015 for evaluation of chest pain with known non-obstructive CAD on previous cardiac catheterization. At that time she reported episodes of chest pain at rest and with exertion with increased dyspnea on exertion and lower extremity edema. An Echo was ordered which showed LVEF of 60-65% with mild LVH and grade 1 diastolic dysfunction but no wall motion abnormalities. Patient was seen again in June 2015 for a pre-operative evaluation for left knee surgery. Her functional capacity was unable to be assessed by history due to chronic knee and back pain and thus a stress test was recommended. However, patient was not interested in undergoing a stress test and wanted to follow-up with previous Cardiologist Dr. Montez Morita for further evaluation.   Patient was in her usual state of health until the evening of 05/04/2018 when she developed sudden onset left shoulder dullness. Patient also report sudden onset of chest pain around the same time that she describes as a heaviness "like someone was sitting on her chest." She ranks the pain as a 9/10 on the pain scale and states it would come and go last for 5-10 minutes at a  time. Associated symptoms include nausea, diaphoresis, and palpitations. Patient was not able to sleep well that night and woke up on 05/05/2018 not feeling well. Symptoms persisted throughout the day so patient eventually called 911 and EMS transported patient to the [facility] ED. Patient denies any recent exertional chest pain or shortness of breath but patient is very sedentary and states she just sits in a recliner all day. She does not palpitations with exertion and attributes that to her weight. Patient states she has been diagnosed with a dysrhythmia before where her heart rate will suddenly increase and start "fluttering" and then return to normal. She has never been told she needs anticoagulation, so this sounds like an SVT type of arrhythmia. She also reports orthopnea and states she sleeps on 6 pillows which she has done for a while now. No PND or significant lower extremity edema. No recent fevers or illnesses.  Patient received Aspirin and one Nitroglycerin in route to the ED with improvement of her symptoms. Upon arrival to the ED, patient hypertensive but vitals stable. EKG showed normal sinus rhythm with no acute ischemic changes. Initial troponin negative. Chest x-ray showed mild cardiomegaly but no acute findings. D-dimer negative. WBC 6.8, Hgb 13.1, Plts 434. Na 136, K 3.5, Glucose 154, Scr 0.99. Patient was admitted for further evaluation.   Currently, patient denies any chest pain or left shoulder pain. However, she continues to have nausea and states she vomited several times overnight.   Past Medical History   Past Medical History:  Diagnosis Date  . Anxiety   . Arthritis   .  Asthma   . Coronary atherosclerosis of native coronary artery    Mild nonobstructive CAD by cardiac catheterization 2008 - Dr. Terrence Dupont  . Degenerative disc disease   . Depression   . Dysrhythmia   . Essential hypertension, benign   . Folliculitis 05/02/5850  . GERD (gastroesophageal reflux disease)   .  H/O hiatal hernia   . Helicobacter pylori gastritis 2008  . Hypercholesterolemia   . Hypertension   . Hyperthyroidism    s/p radiation  . Low back pain   . Migraine   . Nephrolithiasis    Recurring episodes since 2004  . Severe obesity (BMI >= 40) (HCC)   . Sleep apnea    STOP BANG SCORE 6no cpap used  . Type 2 diabetes mellitus with diabetic neuropathy [facility])     Past Surgical History:  Procedure Laterality Date  . ABDOMINAL HYSTERECTOMY    . BACK SURGERY  1993  . BACK SURGERY  1994  . BACK SURGERY  2002  . BACK SURGERY  2011   [place] Left   . CHOLECYSTECTOMY  1996  . COLONOSCOPY  2000 [facility]   INT HEMORRHOIDS/FISSURE  . COLONOSCOPY  2003 [facility], CHANGE IN BOWEL HABITS   INT HEMORRHOIDS  . COLONOSCOPY  2006 [facility]   INT HEMORRHOIDS  . COLONOSCOPY  2007 [facility]   INT HEMORRHOIDS  . CYSTOSCOPY WITH STENT PLACEMENT Left 07/25/2012   Procedure: CYSTOSCOPY, left retograde pyelogram WITH left ureteral  STENT PLACEMENT;  Surgeon: Claybon Jabs, MD;  Location: [facility];  Service: Urology;  Laterality: Left;  . CYSTOSCOPY/RETROGRADE/URETEROSCOPY  12/04/2011   Procedure: CYSTOSCOPY/RETROGRADE/URETEROSCOPY;  Surgeon: Molli Hazard, MD;  Location: [facility];  Service: Urology;  Laterality: Right;  . CYSTOSCOPY/RETROGRADE/URETEROSCOPY/STONE EXTRACTION WITH BASKET Left 09/03/2012   Procedure: CYSTOSCOPY/RETROGRADE pyelogram/digital URETEROSCOPY/STONE EXTRACTION WITH BASKET, left stent removal;  Surgeon: Molli Hazard, MD;  Location: [facility];  Service: Urology;  Laterality: Left;  . ESOPHAGOGASTRODUODENOSCOPY  2008   DPO:EUMPN hiatal hernia./Normal esophagus without evidence of Barrett's mass, stricture, erosion or ulceration./Normal duodenal bulb and second portion of the duodenum./Diffuse erythema in the body and the antrum with occasional erosion.  Biopsies obtained via cold forceps to evaluate for H. pylori gastritis  . FRACTURE SURGERY  1999   right  clavicle  . HOLMIUM LASER APPLICATION Left 07/04/1441   Procedure: HOLMIUM LASER APPLICATION;  Surgeon: Molli Hazard, MD;  Location: [facility];  Service: Urology;  Laterality: Left;  . KNEE ARTHROSCOPY  10/04/2010,    right knee arthroscopy, dr Theda Sers  . left knee arthroscopic surgery  1999  . Left salphingectomy secondary to ectopic pregnancy  1991  . PARTIAL HYSTERECTOMY  1991  . rotary cuff  Right 05/14/2014   [facility]  . UPPER GASTROINTESTINAL ENDOSCOPY  2008 abd pain   H. pylori gastritis  . UPPER GASTROINTESTINAL ENDOSCOPY  1996 [facility] [place]   PUD     Allergies  Allergies  Allergen Reactions  . Morphine Other (See Comments)    Reaction with esophagus, unable to swallow.   . Ace Inhibitors Cough  . Aspirin Other (See Comments)    Reaction with esophagus, unable to swallow.   . Losartan Cough  . Tapentadol Other (See Comments)    Nausea, increased sleepiness, h/a  . Tomato Other (See Comments)    Acid reflux due to acid in tomato  . Adhesive [Tape] Rash    Inpatient Medications    . amLODipine  10 mg Oral Daily  .  arformoterol  15 mcg Nebulization BID  . atorvastatin  10 mg Oral Daily  . azithromycin  500 mg Oral Daily  . budesonide (PULMICORT) nebulizer solution  0.5 mg Nebulization BID  . [START ON 05/08/2018] cloNIDine  0.3 mg Transdermal Q Wed  . diazepam  5 mg Oral TID  . DULoxetine  60 mg Oral BID  . enoxaparin (LOVENOX) injection  40 mg Subcutaneous Daily  . gabapentin  800 mg Oral TID AC & HS  . hydrOXYzine  10-30 mg Oral QHS  . insulin aspart  0-9 Units Subcutaneous TID WC  . montelukast  10 mg Oral QHS  . oxyCODONE  40 mg Oral Q12H  . pantoprazole  40 mg Oral Daily  . potassium chloride SA  20 mEq Oral BID  . triamterene-hydrochlorothiazide  1 tablet Oral Daily    Family History    Family History  Problem Relation Age of Onset  . Heart disease Mother   . Hypertension Mother   . Cancer Mother        Cervical   . Asthma Mother   . Heart  attack Father   . Cancer Father        Prostate  . Colon cancer Father        DECEASED AGE 42  . Colon polyps Neg Hx    She indicated that her mother is deceased. She indicated that her father is deceased. She indicated that all of her four sisters are alive. She indicated that all of her three brothers are alive. She indicated that her maternal grandmother is deceased. She indicated that her maternal grandfather is deceased. She indicated that her paternal grandmother is deceased. She indicated that her paternal grandfather is deceased. She indicated that her daughter is alive. She indicated that the status of her neg hx is unknown.   Social History    Social History   Socioeconomic History  . Marital status: Single    Spouse name: Not on file  . Number of children: 1  . Years of education: 61  . Highest education level: 12th grade  Occupational History  . Occupation: Disable   Social Needs  . Financial resource strain: Not hard at all  . Food insecurity:    Worry: Never true    Inability: Never true  . Transportation needs:    Medical: No    Non-medical: No  Tobacco Use  . Smoking status: Never Smoker  . Smokeless tobacco: Never Used  Substance and Sexual Activity  . Alcohol use: No  . Drug use: No  . Sexual activity: Not Currently    Partners: Male    Birth control/protection: Surgical    Comment: hyst  Lifestyle  . Physical activity:    Days per week: 7 days    Minutes per session: 20 min  . Stress: Only a little  Relationships  . Social connections:    Talks on phone: More than three times a week    Gets together: Once a week    Attends religious service: 1 to 4 times per year    Active member of club or organization: Yes    Attends meetings of clubs or organizations: Never    Relationship status: Never married  . Intimate partner violence:    Fear of current or ex partner: No    Emotionally abused: No    Physically abused: No    Forced sexual activity:  No  Other Topics Concern  . Not on file  Social  History Narrative   Lives alone      Review of Systems    Review of Systems  Constitutional: Positive for diaphoresis. Negative for chills and fever.  HENT: Negative for congestion and sore throat.   Eyes: Negative for blurred vision and double vision.  Respiratory: Positive for shortness of breath. Negative for cough, hemoptysis and sputum production.   Cardiovascular: Positive for chest pain, palpitations and orthopnea. Negative for leg swelling and PND.  Gastrointestinal: Positive for nausea and vomiting. Negative for blood in stool.  Genitourinary: Negative for hematuria.  Musculoskeletal: Positive for back pain (chronic back pain). Negative for myalgias.  Neurological: Negative for dizziness.  Endo/Heme/Allergies: Does not bruise/bleed easily.  Psychiatric/Behavioral: Negative for substance abuse.    Physical Exam    Blood pressure (!) 128/58, pulse 68, temperature 98.1 F (36.7 C), temperature source Oral, resp. rate 20, height 5\' 4"  (1.626 m), weight (!) 140.6 kg, SpO2 95 %.  General: 58 y.o. morbidly obese African-American female resting comfortably in no acute distress. Pleasant and cooperative. HEENT: Normal  Neck: Supple. No carotid bruits or JVD appreciated. Lungs: No increased work of breathing. Clear to auscultation bilaterally. No wheezes, rhonchi, or rales. Heart: RRR. Distinct S1 and S2. No murmurs, gallops, or rubs.  Abdomen: Soft, obese, and non-tender to palpation. Bowel sounds present.   Extremities: No significant lower extremity swelling. Radial and distal pedal pulses 2+ and equal bilaterally. Skin: Warm and dry. Areas of hyperpigmentation noted on bilateral shins with white scaly plaque on left shin consistent with psoriasis.  Neuro: Alert and oriented x3. No focal deficits. Moves all extremities spontaneously. Psych: Normal affect.  Labs    Troponin (Point of Care Test) No results for input(s): TROPIPOC  in the last 72 hours. Recent Labs    05/05/18 2250 05/06/18 0112 05/06/18 0335 05/06/18 0638  TROPONINI <0.03 <0.03 <0.03 <0.03   Lab Results  Component Value Date   WBC 7.1 05/06/2018   HGB 12.9 05/06/2018   HCT 40.8 05/06/2018   MCV 81.9 05/06/2018   PLT 440 (H) 05/06/2018    Recent Labs  Lab 05/05/18 2250 05/06/18 0335  NA 136 138  K 3.5 3.8  CL 99 105  CO2 24 20*  BUN 17 17  CREATININE 0.99 0.85  CALCIUM 9.6 9.6  PROT 8.0  --   BILITOT 0.6  --   ALKPHOS 63  --   ALT 24  --   AST 19  --   GLUCOSE 154* 144*   Lab Results  Component Value Date   CHOL 174 05/06/2018   HDL 49 05/06/2018   LDLCALC 109 (H) 05/06/2018   TRIG 80 05/06/2018   Lab Results  Component Value Date   DDIMER 0.33 05/06/2018     Radiology Studies    Dg Chest Portable 1 View  Result Date: 05/05/2018 CLINICAL DATA:  Nonradiating chest pain beginning last night EXAM: PORTABLE CHEST 1 VIEW COMPARISON:  07/13/2013 FINDINGS: Stable cardiomegaly with mild uncoiling of the thoracic aorta. No acute pulmonary consolidation, effusion or pneumothorax. Resected appearance of the distal right clavicle with mild AC joint osteoarthritis on the left. IMPRESSION: No active disease.  Stable mild cardiomegaly. Electronically Signed   By: Ashley Royalty M.D.   On: 05/05/2018 23:00    EKG     EKG: EKG was personally reviewed and demonstrates: Normal sinus rhythm , rate 76 bpm, with poor R wave progression but no acute ischemic changes.   Telemetry: Telemetry was personally reviewed and demonstrates: Sinus  rhythm with heart rates ranging from 70's to 110's with some PACs/PVCs.  Cardiac Imaging    Echocardiogram 01/05/2015: Study Conclusions: - Left ventricle: The cavity size was normal. Systolic function was   normal. The estimated ejection fraction was in the range of 60%   to 65%. Wall motion was normal; there were no regional wall   motion abnormalities. Diastolic dysfunction, grade indeterminate.    Mildly elevated filling pressures. Mild to moderate concentric   left ventricular hypertrophy.  Assessment & Plan    Unstable Angina - Patient presented with substernal chest pain and left shoulder dullness at rest.  - Most recent cardiac catheterization in 2008 showed mild non-obstructive CAD. - EKG showed no acute ischemic changes.  - Troponin negative x3. - D-dimer negative. - Will check Echo. - Patient denies any chest pain at this time but is currently nauseous and reports multiple episodes of emesis overnight. - Patient has multiple cardiovascular risk factors (HTN, HLD, T2DM, morbid obesity) and may benefit from a cardiac catheterization for further ischemic evaluation. Patient is NPO at this time. Will discuss with MD.   Hypertension - Most recent BP 128/58. - Continue home medications. - Will likely add beta-blocker following potential cardiac catheterization.   Hyperlipidemia - Lipid panel this admission: Cholesterol 174, Triglycerides 80, HDL 49, LDL 109. - Currently on Lipitor 10mg  daily at home. Will likely need to increase dose to 40-80mg .   Type 2 Diabetes Mellitus  - Per primary team.  Signed, Darreld Mclean, PA-C 05/07/2018, 8:45 AM  For questions or updates, please contact   Please consult [email or web address] for contact info under Cardiology/STEMI.  ---------------------------------------------------------------------------------------------   History and all data above reviewed.  Patient examined.  I agree with the findings as above.  Angela [name] is a pleasant 58 year old female with a history of nonobstructive coronary artery disease who presents today with an episode of chest pressure and left arm pain overnight.  She has ruled out for myocardial infarction with negative troponins and an unremarkable ECG.  This is the first episode of chest pain she has had in many years per her report.  Constitutional: No acute distress Eyes: pupils equally round and  reactive to light, sclera non-icteric, normal conjunctiva and lids ENMT: normal dentition, moist mucous membranes Cardiovascular: regular rhythm, normal rate, no murmurs. S1 and S2 normal. Radial pulses normal bilaterally. No jugular venous distention.  Respiratory: clear to auscultation bilaterally GI : normal bowel sounds, soft and nontender. No distention.   MSK: extremities warm, well perfused. No edema.  NEURO: grossly nonfocal exam, moves all extremities. PSYCH: alert and oriented x 3, normal mood and affect.   All available labs, radiology testing, previous records reviewed. Agree with documented assessment and plan of my colleague as stated above with the following additions or changes:  Principal Problem:   Chest pain Active Problems:   Diabetes mellitus without complication ([place])   Hyperlipidemia LDL goal <70   Morbid obesity ([place])   Essential hypertension   Coronary atherosclerosis of native coronary artery   Chronic pain    Plan: I am concerned about unstable angina and I have recommended coronary angiography to the patient.  However the patient is extremely hesitant to proceed with coronary angiography.  I spent approximately 30 minutes discussing with the patient the indications for coronary angiography, reasons for angiography in the setting of unstable angina, indications for PCI, and risks and benefits of coronary angiography plus minus PCI.  After our thorough discussion I have answered her questions  to the best of my ability.  The patient remains extremely hesitant to proceed with coronary angiogram angiography and states that it will make her blood pressure "shoot through the roof ".  We discussed that I am concerned about progression of her known nonobstructive coronary artery disease, and she tells me that she had a "45%" blockage at that time.  We are unable to see the records from that visit.  We discussed radial approach and the decreased risk of bleeding and infection  with this access.  My concern with stress testing is that her body habitus may lead to significant artifact.  Her symptoms are concerning enough that with regard to [facility], there would not be significant incremental benefit of stress testing given that I feel she is high risk for obstructive coronary artery disease (she has known coronary artery disease that is nonobstructive).  We participated in shared decision making and determined that the patient would not like to pursue coronary angiography today.  She is seeing her primary care physician on Thursday and will see our heart care team in 2 weeks in the clinic.  In the interim I will initiate metoprolol tartrate 12.5 mg twice daily for the indication of coronary artery disease, hypertension.  With her diabetes ideally we would have her on an ACE or ARB, however these have been listed on her allergy list as causing a cough.  I have expressed concern to the patient that if she has any red flag symptoms including chest pressure chest pain, shortness of breath, dizziness, lightheadedness, or any other symptoms concerning to her she should urgently present to the emergency department.  Primary service is planning to obtain an echocardiogram prior to her dismissal, I believe this is reasonable to assess for structural issues.  If there are abnormalities on echocardiogram, I would strongly suggest the patient pursue coronary angiography.  Time Spent Directly with Patient:  I have spent a total of 75 minutes with the patient reviewing hospital notes, telemetry, EKGs, labs and examining the patient as well as establishing an assessment and plan that was discussed personally with the patient.  > 50% of time was spent in direct patient care.  Length of Stay:  LOS: 0 days   Elouise Munroe, MD [facility] 9:49 AM  05/07/2018

## 2018-05-07 NOTE — Progress Notes (Signed)
Progress Note    Angela [name]  QPY:[id number] DOB: [birth year]  DOA: 05/05/2018 PCP: Fayrene Helper, MD    Brief Narrative:     Medical records reviewed and are as summarized below:  Angela [name] is an 58 y.o. female with medical history significant of HTN, HLD, nonobstructive CAD, DM type II, chronic pain, morbid obesity, and OSA on CPAP; who presents with complaints of chest pain.  Symptoms started around 10:45 PM 2 nights ago while laying down.  She reports having dull aching pain in her left arm and feeling that someone was standing on her chest.  She tried to get up and get some air, but symptoms persisted.  Complained of associated symptoms of diaphoresis, shortness of breath, and nausea. Her nitroglycerin was outdated.  She tried to rest without much improvement.  She reports having a previous history of 4 heart attacks, but denies any stents ever being placed.    Assessment/Plan:   Principal Problem:   Chest pain Active Problems:   Diabetes mellitus without complication (HCC)   Hyperlipidemia LDL goal <70   Morbid obesity (HCC)   Essential hypertension   Coronary atherosclerosis of native coronary artery   Chronic pain  Chest pain:  -Patient presents with complaints of acute chest pain with radiation to her left arm -CE negative -initially refused any intervention but now statingthat she will get heart cath recommended by cardiology -echo with grade 2 diastolic dsfxn  Hemoptysis -VERY mild -d dimer negative -added azithromycin for URI and she is much improved today  Reported hiatal hernia -encourage small frequent meals and not laying flat after eating  Essential hypertension:  -BP high at admission, much improved -BB added this AM  Hyperthyroidism: Patient with previous history of radiation.  Last TSH noted to be elevated at 7.22 on 10/08/2017. - down to 6.946  Diabetes mellitus type 2: Patient currently on  metformin. Initial blood glucose 154 with  her last hemoglobin A1c being 7.4 on 02/06/2018. - Hold metformin for cath - CBGs q. before meals with sensitive SSI  Chronic pain - Continue Cymbalta, oxycodone, gabapentin  Anxiety - Continue Valium as needed for anxiety  Hyperlipidemia - LDL 109 - Continue Lipitor  Morbid obese: Estimated body mass index is 53.21 kg/m as calculated from the following:   Height as of this encounter: 5\' 4"  (1.626 m).   Weight as of this encounter: 140.6 kg.   Family Communication/Anticipated D/C date and plan/Code Status   DVT prophylaxis: Lovenox ordered. Code Status: Full Code.  Family Communication: none at bedside Disposition Plan: pending results of heart cath   Medical Consultants:    cards     Subjective:   Reported vomiting last PM but eating well This AM said she did not want heart cath, this PM she does  Objective:    Vitals:   05/07/18 0130 05/07/18 0550 05/07/18 0829 05/07/18 1448  BP: 132/78 (!) 128/58  115/64  Pulse: 78 68  71  Resp: 17 20  18   Temp: 97.9 F (36.6 C) 98.1 F (36.7 C)  98.2 F (36.8 C)  TempSrc: Oral Oral  Oral  SpO2: 96% 95% 93% 94%  Weight:      Height:        Intake/Output Summary (Last 24 hours) at 05/07/2018 1635 Last data filed at 05/07/2018 1300 Gross per 24 hour  Intake 585 ml  Output -  Net 585 ml   Filed Weights   05/05/18 2153  Weight: (!) 140.6 kg  Exam: In bed, NAD Obese female rrr Chronic skin changes on shin-- does not appear infected A+Ox3- pleasant and cooperative   Data Reviewed:   I have personally reviewed following labs and imaging studies:  Labs: Labs show the following:   Basic Metabolic Panel: Recent Labs  Lab 05/05/18 2250 05/06/18 0335  NA 136 138  K 3.5 3.8  CL 99 105  CO2 24 20*  GLUCOSE 154* 144*  BUN 17 17  CREATININE 0.99 0.85  CALCIUM 9.6 9.6   GFR Estimated Creatinine Clearance: 101.5 mL/min (by C-G formula based on SCr of 0.85 mg/dL). Liver Function Tests: Recent  Labs  Lab 05/05/18 2250  AST 19  ALT 24  ALKPHOS 63  BILITOT 0.6  PROT 8.0  ALBUMIN 3.9   No results for input(s): LIPASE, AMYLASE in the last 168 hours. No results for input(s): AMMONIA in the last 168 hours. Coagulation profile No results for input(s): INR, PROTIME in the last 168 hours.  CBC: Recent Labs  Lab 05/05/18 2250 05/06/18 0335  WBC 6.8 7.1  NEUTROABS 3.4 3.9  HGB 13.1 12.9  HCT 41.3 40.8  MCV 81.9 81.9  PLT 434* 440*   Cardiac Enzymes: Recent Labs  Lab 05/05/18 2250 05/06/18 0112 05/06/18 0335 05/06/18 0638  TROPONINI <0.03 <0.03 <0.03 <0.03   BNP (last 3 results) No results for input(s): PROBNP in the last 8760 hours. CBG: Recent Labs  Lab 05/06/18 1256 05/06/18 1634 05/06/18 2215 05/07/18 0652 05/07/18 1155  GLUCAP 157* 139* 127* 135* 184*   D-Dimer: Recent Labs    05/06/18 1519  DDIMER 0.33   Hgb A1c: No results for input(s): HGBA1C in the last 72 hours. Lipid Profile: Recent Labs    05/06/18 0335  CHOL 174  HDL 49  LDLCALC 109*  TRIG 80  CHOLHDL 3.6   Thyroid function studies: Recent Labs    05/06/18 0638  TSH 6.946*   Anemia work up: No results for input(s): VITAMINB12, FOLATE, FERRITIN, TIBC, IRON, RETICCTPCT in the last 72 hours. Sepsis Labs: Recent Labs  Lab 05/05/18 2250 05/06/18 0335  WBC 6.8 7.1    Microbiology No results found for this or any previous visit (from the past 240 hour(s)).  Procedures and diagnostic studies:  Dg Chest Portable 1 View  Result Date: 05/05/2018 CLINICAL DATA:  Nonradiating chest pain beginning last night EXAM: PORTABLE CHEST 1 VIEW COMPARISON:  07/13/2013 FINDINGS: Stable cardiomegaly with mild uncoiling of the thoracic aorta. No acute pulmonary consolidation, effusion or pneumothorax. Resected appearance of the distal right clavicle with mild AC joint osteoarthritis on the left. IMPRESSION: No active disease.  Stable mild cardiomegaly. Electronically Signed   By: Ashley Royalty  M.D.   On: 05/05/2018 23:00    Medications:   . amLODipine  10 mg Oral Daily  . arformoterol  15 mcg Nebulization BID  . atorvastatin  10 mg Oral Daily  . azithromycin  500 mg Oral Daily  . budesonide (PULMICORT) nebulizer solution  0.5 mg Nebulization BID  . [START ON 05/08/2018] cloNIDine  0.3 mg Transdermal Q Wed  . diazepam  5 mg Oral TID  . DULoxetine  60 mg Oral BID  . enoxaparin (LOVENOX) injection  40 mg Subcutaneous Daily  . gabapentin  800 mg Oral TID AC & HS  . hydrOXYzine  10-30 mg Oral QHS  . insulin aspart  0-9 Units Subcutaneous TID WC  . metoprolol tartrate  12.5 mg Oral BID  . montelukast  10 mg Oral QHS  .  oxyCODONE  40 mg Oral Q12H  . pantoprazole  40 mg Oral Daily  . potassium chloride SA  20 mEq Oral BID  . triamterene-hydrochlorothiazide  1 tablet Oral Daily   Continuous Infusions:   LOS: 0 days   Geradine Girt  [facility]   *Please refer to [email or web address], password [email or web address] to get updated schedule on who will round on this patient, as hospitalists switch teams weekly. If 7PM-7AM, please contact night-coverage at [email or web address], password [email or web address] for any overnight needs.  05/07/2018, 4:35 PM

## 2018-05-07 NOTE — Progress Notes (Signed)
Patient unable to wear our CPAP tonight, cannot use the nose mask for issues of feeling like they are suffocating.

## 2018-05-07 NOTE — Progress Notes (Signed)
Pt decided to get a cardiac catheterization after discussing with her daughter. PA made aware.

## 2018-05-08 ENCOUNTER — Encounter (HOSPITAL_COMMUNITY)
Admission: EM | Disposition: A | Discharge status: Home / Self Care | Payer: Self-pay | Source: Home / Self Care | Attending: Emergency Medicine

## 2018-05-08 DIAGNOSIS — I25110 Atherosclerotic heart disease of native coronary artery with unstable angina pectoris: Secondary | ICD-10-CM | POA: Diagnosis not present

## 2018-05-08 DIAGNOSIS — R079 Chest pain, unspecified: Secondary | ICD-10-CM | POA: Diagnosis not present

## 2018-05-08 DIAGNOSIS — I200 Unstable angina: Secondary | ICD-10-CM

## 2018-05-08 DIAGNOSIS — G8929 Other chronic pain: Secondary | ICD-10-CM | POA: Diagnosis not present

## 2018-05-08 DIAGNOSIS — E119 Type 2 diabetes mellitus without complications: Secondary | ICD-10-CM | POA: Diagnosis not present

## 2018-05-08 DIAGNOSIS — I25119 Atherosclerotic heart disease of native coronary artery with unspecified angina pectoris: Secondary | ICD-10-CM | POA: Diagnosis not present

## 2018-05-08 HISTORY — PX: LEFT HEART CATH AND CORONARY ANGIOGRAPHY: CATH118249

## 2018-05-08 LAB — GLUCOSE, CAPILLARY
Glucose-Capillary: 139 mg/dL — ABNORMAL HIGH (ref 70–99)
Glucose-Capillary: 131 mg/dL — ABNORMAL HIGH (ref 70–99)
Glucose-Capillary: 127 mg/dL — ABNORMAL HIGH (ref 70–99)
Glucose-Capillary: 163 mg/dL — ABNORMAL HIGH (ref 70–99)

## 2018-05-08 LAB — BASIC METABOLIC PANEL
Anion gap: 9 (ref 5–15)
BUN: 10 mg/dL (ref 6–20)
CO2: 27 mmol/L (ref 22–32)
Calcium: 9.3 mg/dL (ref 8.9–10.3)
Chloride: 99 mmol/L (ref 98–111)
Creatinine, Ser: 0.8 mg/dL (ref 0.44–1.00)
GFR calc Af Amer: >60 mL/min (ref >60)
GFR calc non Af Amer: >60 mL/min (ref >60)
Glucose, Bld: 139 mg/dL — ABNORMAL HIGH (ref 70–99)
Potassium: 3.4 mmol/L — ABNORMAL LOW (ref 3.5–5.1)
Sodium: 135 mmol/L (ref 135–145)

## 2018-05-08 LAB — HCG, SERUM, QUALITATIVE: Preg, Serum: NEGATIVE

## 2018-05-08 LAB — HIV ANTIBODY (ROUTINE TESTING W REFLEX): HIV SCREEN 4TH GENERATION: NONREACTIVE

## 2018-05-08 SURGERY — LEFT HEART CATH AND CORONARY ANGIOGRAPHY
Anesthesia: LOCAL

## 2018-05-08 MED ORDER — POTASSIUM CHLORIDE CRYS ER 20 MEQ PO TBCR: 40.0000 meq | EXTENDED_RELEASE_TABLET | Freq: Once | ORAL | Status: DC

## 2018-05-08 MED ORDER — LIDOCAINE HCL (PF) 1 % IJ SOLN
INTRAMUSCULAR | Status: AC
  Filled 2018-05-08: qty 30

## 2018-05-08 MED ORDER — VERAPAMIL HCL 2.5 MG/ML IV SOLN
INTRAVENOUS | Status: AC
  Filled 2018-05-08: qty 2

## 2018-05-08 MED ORDER — SODIUM CHLORIDE 0.9 % IV SOLN: 250.0000 mL | INTRAVENOUS | Status: DC | PRN

## 2018-05-08 MED ORDER — SODIUM CHLORIDE 0.9% FLUSH: 3.0000 mL | INTRAVENOUS | Status: DC | PRN

## 2018-05-08 MED ORDER — SODIUM CHLORIDE 0.9 % IV SOLN: INTRAVENOUS | Status: DC

## 2018-05-08 MED ORDER — VERAPAMIL HCL 2.5 MG/ML IV SOLN
INTRAVENOUS | Status: DC | PRN
  Administered 2018-05-08: 10 mL via INTRA_ARTERIAL

## 2018-05-08 MED ORDER — FENTANYL CITRATE (PF) 100 MCG/2ML IJ SOLN
INTRAMUSCULAR | Status: DC | PRN
  Administered 2018-05-08: 25 ug via INTRAVENOUS
  Administered 2018-05-08: 50 ug via INTRAVENOUS

## 2018-05-08 MED ORDER — MIDAZOLAM HCL 2 MG/2ML IJ SOLN
INTRAMUSCULAR | Status: DC | PRN
  Administered 2018-05-08: 1 mg via INTRAVENOUS
  Administered 2018-05-08: 2 mg via INTRAVENOUS

## 2018-05-08 MED ORDER — SODIUM CHLORIDE 0.9% FLUSH
3.0000 mL | Freq: Two times a day (BID) | INTRAVENOUS | Status: DC
  Administered 2018-05-09: 3 mL via INTRAVENOUS

## 2018-05-08 MED ORDER — HEPARIN (PORCINE) IN NACL 1000-0.9 UT/500ML-% IV SOLN
INTRAVENOUS | Status: AC
  Filled 2018-05-08: qty 1000

## 2018-05-08 MED ORDER — MIDAZOLAM HCL 2 MG/2ML IJ SOLN
INTRAMUSCULAR | Status: AC
  Filled 2018-05-08: qty 2

## 2018-05-08 MED ORDER — ATORVASTATIN CALCIUM 40 MG PO TABS
40.0000 mg | ORAL_TABLET | Freq: Every day | ORAL | Status: DC
  Administered 2018-05-09: 40 mg via ORAL
  Filled 2018-05-08: qty 1

## 2018-05-08 MED ORDER — HEPARIN SODIUM (PORCINE) 1000 UNIT/ML IJ SOLN
INTRAMUSCULAR | Status: DC | PRN
  Administered 2018-05-08: 7000 [IU] via INTRAVENOUS

## 2018-05-08 MED ORDER — IOHEXOL 350 MG/ML SOLN
INTRAVENOUS | Status: DC | PRN
  Administered 2018-05-08: 45 mL via INTRA_ARTERIAL

## 2018-05-08 MED ORDER — FENTANYL CITRATE (PF) 100 MCG/2ML IJ SOLN
INTRAMUSCULAR | Status: AC
  Filled 2018-05-08: qty 2

## 2018-05-08 MED ORDER — LIDOCAINE HCL (PF) 1 % IJ SOLN
INTRAMUSCULAR | Status: DC | PRN
  Administered 2018-05-08: 2 mL

## 2018-05-08 MED ORDER — HEPARIN (PORCINE) IN NACL 1000-0.9 UT/500ML-% IV SOLN
INTRAVENOUS | Status: DC | PRN
  Administered 2018-05-08 (×2): 500 mL

## 2018-05-08 MED ORDER — ONDANSETRON HCL 4 MG/2ML IJ SOLN
4.0000 mg | Freq: Four times a day (QID) | INTRAMUSCULAR | Status: DC | PRN
  Administered 2018-05-08: 4 mg via INTRAVENOUS
  Filled 2018-05-08: qty 2

## 2018-05-08 MED ORDER — ACETAMINOPHEN 325 MG PO TABS: 650.0000 mg | ORAL_TABLET | ORAL | Status: DC | PRN

## 2018-05-08 SURGICAL SUPPLY — 9 items
CATH OPTITORQUE TIG 4.0 5F (CATHETERS) ×1 IMPLANT
DEVICE RAD COMP TR BAND LRG (VASCULAR PRODUCTS) ×1 IMPLANT
GLIDESHEATH SLEND SS 6F .021 (SHEATH) ×1 IMPLANT
GUIDEWIRE INQWIRE 1.5J.035X260 (WIRE) IMPLANT
INQWIRE 1.5J .035X260CM (WIRE) ×2
KIT HEART LEFT (KITS) ×2 IMPLANT
PACK CARDIAC CATHETERIZATION (CUSTOM PROCEDURE TRAY) ×2 IMPLANT
TRANSDUCER W/STOPCOCK (MISCELLANEOUS) ×2 IMPLANT
TUBING CIL FLEX 10 FLL-RA (TUBING) ×2 IMPLANT

## 2018-05-08 NOTE — Progress Notes (Addendum)
Progress Note  Patient Name: Angela [name] Date of Encounter: 05/08/2018  Primary Cardiologist: Elouise Munroe, MD   Subjective   No chest pain or SOB.    Inpatient Medications    Scheduled Meds: . amLODipine  10 mg Oral Daily  . arformoterol  15 mcg Nebulization BID  . atorvastatin  10 mg Oral Daily  . azithromycin  500 mg Oral Daily  . budesonide (PULMICORT) nebulizer solution  0.5 mg Nebulization BID  . cloNIDine  0.3 mg Transdermal Q Wed  . diazepam  5 mg Oral TID  . DULoxetine  60 mg Oral BID  . enoxaparin (LOVENOX) injection  40 mg Subcutaneous Daily  . gabapentin  800 mg Oral TID AC & HS  . hydrOXYzine  10-30 mg Oral QHS  . insulin aspart  0-9 Units Subcutaneous TID WC  . metoprolol tartrate  12.5 mg Oral BID  . montelukast  10 mg Oral QHS  . oxyCODONE  40 mg Oral Q12H  . pantoprazole  40 mg Oral Daily  . potassium chloride SA  20 mEq Oral BID  . sodium chloride flush  3 mL Intravenous Q12H  . triamterene-hydrochlorothiazide  1 tablet Oral Daily   Continuous Infusions: . sodium chloride    . sodium chloride 1 mL/kg/hr (05/08/18 0510)   PRN Meds: sodium chloride, acetaminophen, cyclobenzaprine, hydrALAZINE, ipratropium-albuterol, meclizine, nitroGLYCERIN, ondansetron (ZOFRAN) IV, sodium chloride flush   Vital Signs    Vitals:   05/07/18 1448 05/07/18 1939 05/07/18 2239 05/08/18 0650  BP: 115/64  (!) 155/82 128/66  Pulse: 71  81 78  Resp: 18  18 18   Temp: 98.2 F (36.8 C)  98.1 F (36.7 C) 98.2 F (36.8 C)  TempSrc: Oral  Oral Oral  SpO2: 94% 94% 96% 97%  Weight:      Height:        Intake/Output Summary (Last 24 hours) at 05/08/2018 0826 Last data filed at 05/08/2018 0410 Gross per 24 hour  Intake 600 ml  Output -  Net 600 ml   Filed Weights   05/05/18 2153  Weight: (!) 140.6 kg    Telemetry    SR to ST - Personally Reviewed  ECG    No new - Personally Reviewed  Physical Exam   GEN: No acute distress.   Neck: No JVD Cardiac:  RRR, no murmurs, rubs, or gallops.  Respiratory: Clear to auscultation bilaterally. GI: Soft, nontender, non-distended  MS: No edema; No deformity. Neuro:  Nonfocal  Psych: Normal affect   Labs    Chemistry Recent Labs  Lab 05/05/18 2250 05/06/18 0335 05/08/18 0006  NA 136 138 135  K 3.5 3.8 3.4*  CL 99 105 99  CO2 24 20* 27  GLUCOSE 154* 144* 139*  BUN 17 17 10   CREATININE 0.99 0.85 0.80  CALCIUM 9.6 9.6 9.3  PROT 8.0  --   --   ALBUMIN 3.9  --   --   AST 19  --   --   ALT 24  --   --   ALKPHOS 63  --   --   BILITOT 0.6  --   --   GFRNONAA >60 >60 >60  GFRAA >60 >60 >60  ANIONGAP 13 13 9      Hematology Recent Labs  Lab 05/05/18 2250 05/06/18 0335  WBC 6.8 7.1  RBC 5.04 4.98  HGB 13.1 12.9  HCT 41.3 40.8  MCV 81.9 81.9  MCH 26.0 25.9*  MCHC 31.7 31.6  RDW 14.7  14.7  PLT 434* 440*    Cardiac Enzymes Recent Labs  Lab 05/05/18 2250 05/06/18 0112 05/06/18 0335 05/06/18 0638  TROPONINI <0.03 <0.03 <0.03 <0.03   No results for input(s): TROPIPOC in the last 168 hours.   BNPNo results for input(s): BNP, PROBNP in the last 168 hours.   DDimer  Recent Labs  Lab 05/06/18 1519  DDIMER 0.33     Radiology    No results found.  Cardiac Studies   Echo 05/07/18 Study Conclusions  - Left ventricle: The cavity size was normal. Systolic function was   normal. The estimated ejection fraction was in the range of 60%   to 65%. Wall motion was normal; there were no regional wall   motion abnormalities. Features are consistent with a pseudonormal   left ventricular filling pattern, with concomitant abnormal   relaxation and increased filling pressure (grade 2 diastolic   dysfunction). Doppler parameters are consistent with high   ventricular filling pressure. - Aortic valve: Valve area (VTI): 3.19 cm^2. Valve area (Vmax):   2.84 cm^2. Valve area (Vmean): 3.03 cm^2. - Mitral valve: There was mild regurgitation. - Left atrium: The atrium was moderately  dilated.   Patient Profile     57 y.o. female with a history of mild non-obstructive CAD on cardiac catheterization in 2008, hypertension, hyperlipidemia, type 2 diabetes mellitus, obstructive sleep apnea not on CPAP now admitted for chest pain.   Assessment & Plan    Unstable angina with neg troponin.  But multiple risk factors for CAD.  Plan for cardiac cath today.   The patient understands that risks included but are not limited to stroke (1 in 1000), death (1 in 70), kidney failure [usually temporary] (1 in 500), bleeding (1 in 200), allergic reaction [possibly serious] (1 in 200). Pt in agreement to proceed.   HTN BP 155/82 to 128/66 BB was added.   DM-2 per primary team  HLD on statin, will increase lipitor due to known CAD goal of LDL < 70        For questions or updates, please contact [place] Please consult [email or web address] for contact info under        Signed, Cecilie Kicks, NP  05/08/2018, 8:26 AM    ---------------------------------------------------------------------------------------------   History and all data above reviewed.  Patient examined.  I agree with the findings as above.  [place] feels well, anticipating coronary angiography.  Constitutional: No acute distress Eyes: pupils equally round and reactive to light, sclera non-icteric, normal conjunctiva and lids ENMT: normal dentition, moist mucous membranes Cardiovascular: regular rhythm, normal rate, no murmurs. S1 and S2 normal. Radial pulses normal bilaterally. No jugular venous distention.  Respiratory: clear to auscultation bilaterally GI : normal bowel sounds, soft and nontender. No distention.   MSK: extremities warm, well perfused. No edema.  NEURO: grossly nonfocal exam, moves all extremities. PSYCH: alert and oriented x 3, normal mood and affect.   All available labs, radiology testing, previous records reviewed. Agree with documented assessment and plan of my colleague as  stated above with the following additions or changes:  Principal Problem:   Chest pain Active Problems:   Diabetes mellitus without complication ([place])   Hyperlipidemia LDL goal <70   Morbid obesity ([place])   Essential hypertension   Coronary atherosclerosis of native coronary artery   Chronic pain    Plan: coronary angiography today. Consent obtained by L. [place] NP.  ADDENDUM: no obstructive CAD. Has cardiology f/u on 05/21/2018.  Continue home meds plus  newly added metoprolol 12.5 BID.   [facility] will sign off.   Medication Recommendations:  Home meds + metoprolol tartrate 12.5 BID Other recommendations (labs, testing, etc):  - Follow up as an outpatient:  05/21/2018 in CV clinic.   Length of Stay:  LOS: 0 days   Elouise Munroe, MD [facility] 6:53 PM  05/08/2018

## 2018-05-08 NOTE — Interval H&P Note (Signed)
Cath Lab Visit (complete for each Cath Lab visit)  Clinical Evaluation Leading to the Procedure:   ACS: No.  Non-ACS:    Anginal Classification: CCS III  Anti-ischemic medical therapy: Minimal Therapy (1 class of medications)  Non-Invasive Test Results: No non-invasive testing performed  Prior CABG: No previous CABG      Cath Lab Visit (complete for each Cath Lab visit)  Clinical Evaluation Leading to the Procedure:   ACS: No.  Non-ACS:    Anginal Classification: CCS III  Anti-ischemic medical therapy: Minimal Therapy (1 class of medications)  Non-Invasive Test Results: No non-invasive testing performed  Prior CABG: No previous CABG      History and Physical Interval Note:  05/08/2018 2:53 PM  Angela [name]  has presented today for surgery, with the diagnosis of ua  The various methods of treatment have been discussed with the patient and family. After consideration of risks, benefits and other options for treatment, the patient has consented to  Procedure(s): LEFT HEART CATH AND CORONARY ANGIOGRAPHY (N/A) as a surgical intervention .  The patient's history has been reviewed, patient examined, no change in status, stable for surgery.  I have reviewed the patient's chart and labs.  Questions were answered to the patient's satisfaction.     Angela [name]

## 2018-05-09 ENCOUNTER — Ambulatory Visit: Payer: Medicare Other | Admitting: Family Medicine

## 2018-05-09 ENCOUNTER — Telehealth: Payer: Self-pay

## 2018-05-09 ENCOUNTER — Encounter (HOSPITAL_COMMUNITY): Payer: Self-pay | Admitting: Cardiovascular Disease

## 2018-05-09 DIAGNOSIS — R079 Chest pain, unspecified: Secondary | ICD-10-CM | POA: Diagnosis not present

## 2018-05-09 DIAGNOSIS — E119 Type 2 diabetes mellitus without complications: Secondary | ICD-10-CM | POA: Diagnosis not present

## 2018-05-09 DIAGNOSIS — I25110 Atherosclerotic heart disease of native coronary artery with unstable angina pectoris: Secondary | ICD-10-CM | POA: Diagnosis not present

## 2018-05-09 DIAGNOSIS — I25119 Atherosclerotic heart disease of native coronary artery with unspecified angina pectoris: Secondary | ICD-10-CM | POA: Diagnosis not present

## 2018-05-09 DIAGNOSIS — G8929 Other chronic pain: Secondary | ICD-10-CM | POA: Diagnosis not present

## 2018-05-09 DIAGNOSIS — I10 Essential (primary) hypertension: Secondary | ICD-10-CM | POA: Diagnosis not present

## 2018-05-09 LAB — CBC
HCT: 40 % (ref 36.0–46.0)
Hemoglobin: 12.3 g/dL (ref 12.0–15.0)
MCH: 25.2 pg — AB (ref 26.0–34.0)
MCHC: 30.8 g/dL (ref 30.0–36.0)
MCV: 82 fL (ref 80.0–100.0)
PLATELETS: 381 10*3/uL (ref 150–400)
RBC: 4.88 MIL/uL (ref 3.87–5.11)
RDW: 14.5 % (ref 11.5–15.5)
WBC: 5.9 10*3/uL (ref 4.0–10.5)
nRBC: 0 % (ref 0.0–0.2)

## 2018-05-09 LAB — BASIC METABOLIC PANEL
Anion gap: 7 (ref 5–15)
BUN: 8 mg/dL (ref 6–20)
CO2: 28 mmol/L (ref 22–32)
Calcium: 8.7 mg/dL — ABNORMAL LOW (ref 8.9–10.3)
Chloride: 101 mmol/L (ref 98–111)
Creatinine, Ser: 0.81 mg/dL (ref 0.44–1.00)
GFR calc Af Amer: >60 mL/min (ref >60)
GFR calc non Af Amer: >60 mL/min (ref >60)
Glucose, Bld: 152 mg/dL — ABNORMAL HIGH (ref 70–99)
Potassium: 4.1 mmol/L (ref 3.5–5.1)
Sodium: 136 mmol/L (ref 135–145)

## 2018-05-09 LAB — GLUCOSE, CAPILLARY
Glucose-Capillary: 157 mg/dL — ABNORMAL HIGH (ref 70–99)
Glucose-Capillary: 211 mg/dL — ABNORMAL HIGH (ref 70–99)

## 2018-05-09 LAB — T4, FREE: Free T4: 0.81 ng/dL — ABNORMAL LOW (ref 0.82–1.77)

## 2018-05-09 MED ORDER — ATORVASTATIN CALCIUM 40 MG PO TABS: 40.0000 mg | ORAL_TABLET | Freq: Every day | ORAL | 0 refills | Status: DC

## 2018-05-09 MED ORDER — FAMOTIDINE 20 MG PO TABS
20.0000 mg | ORAL_TABLET | Freq: Once | ORAL | Status: AC
  Administered 2018-05-09: 20 mg via ORAL

## 2018-05-09 MED ORDER — METOPROLOL TARTRATE 25 MG PO TABS: 12.5000 mg | ORAL_TABLET | Freq: Two times a day (BID) | ORAL | 0 refills | Status: DC

## 2018-05-09 MED ORDER — ALUM & MAG HYDROXIDE-SIMETH 200-200-20 MG/5ML PO SUSP
30.0000 mL | ORAL | Status: DC | PRN
  Administered 2018-05-09: 30 mL via ORAL
  Filled 2018-05-09: qty 30

## 2018-05-09 MED ORDER — NITROGLYCERIN 0.4 MG SL SUBL: 0.4000 mg | SUBLINGUAL_TABLET | SUBLINGUAL | 0 refills | Status: DC | PRN

## 2018-05-09 MED ORDER — OXYCODONE HCL ER 40 MG PO T12A: 40.0000 mg | EXTENDED_RELEASE_TABLET | Freq: Two times a day (BID) | ORAL | 0 refills | Status: AC

## 2018-05-09 NOTE — Telephone Encounter (Signed)
Transition Care Management Follow-up Telephone Call   Date discharged?   05-09-18             How have you been since you were released from the hospital? Feeling much better than the last few days   Do you understand why you were in the hospital? Yes, patient explained procedure to me    Do you understand the discharge instructions? Yes    Where were you discharged to? Home    Items Reviewed:  Medications reviewed: Yes   Allergies reviewed: Yes   Dietary changes reviewed: Low sodium, Heart healthy   Referrals reviewed: Yes    Functional Questionnaire:   Activities of Daily Living (ADLs):  Able to perform, but has assistance right now     Any transportation issues/concerns?: None    Any patient concerns? None    Confirmed importance and date/time of follow-up visits scheduled Going back to cardiology in 6 weeks      Confirmed with patient if condition begins to worsen call PCP or go to the ER.  Patient was given the office number and encouraged to call back with question or concerns.  :

## 2018-05-09 NOTE — Discharge Summary (Signed)
Angela [name], is a 58 y.o. female  DOB 1960-05-11  MRN [id number].  Admission date:  05/05/2018  Admitting Physician  Angela Morton, MD  Discharge Date:  05/09/2018   Primary MD  Angela Helper, MD  Recommendations for primary care physician for things to follow:   During hospitalization patient was on a lower dose of hydrochlorothiazide-triamterene medication will need to recheck BMP and blood pressures given the addition of metoprolol.   Admission Diagnosis  Chest pain, unspecified type [R07.9]   Discharge Diagnosis    Principal Problem:   Chest pain Active Problems:   Diabetes mellitus without complication (HCC)   Hyperlipidemia LDL goal <70   Morbid obesity (HCC)   Essential hypertension   Coronary atherosclerosis of native coronary artery   Chronic pain      Past [name] History:  Diagnosis Date  . Anxiety   . Arthritis   . Asthma   . Coronary atherosclerosis of native coronary artery    Mild nonobstructive CAD by cardiac catheterization 2008 - Dr. Terrence [name]  . Degenerative disc disease   . Depression   . Dysrhythmia   . Essential hypertension, benign   . Folliculitis 6/76/1950  . GERD (gastroesophageal reflux disease)   . H/O hiatal hernia   . Helicobacter pylori gastritis 2008  . Hypercholesterolemia   . Hypertension   . Hyperthyroidism    s/p radiation  . Low back pain   . Migraine   . Nephrolithiasis    Recurring episodes since 2004  . Severe obesity (BMI >= 40) (HCC)   . Sleep apnea    STOP BANG SCORE 6no cpap used  . Type 2 diabetes mellitus with diabetic neuropathy [name])     Past Surgical History:  Procedure Laterality Date  . ABDOMINAL HYSTERECTOMY    . BACK SURGERY  1993  . BACK SURGERY  1994  . BACK SURGERY  2002  . BACK SURGERY  2011   [facility]  Left   . CHOLECYSTECTOMY  1996  . COLONOSCOPY  2000 [facility]   INT HEMORRHOIDS/FISSURE  . COLONOSCOPY  2003 [facility], CHANGE IN BOWEL HABITS   INT HEMORRHOIDS  . COLONOSCOPY  2006 [facility]   INT HEMORRHOIDS  . COLONOSCOPY  2007 [name]   INT HEMORRHOIDS  . CYSTOSCOPY WITH STENT PLACEMENT Left 07/25/2012   Procedure: CYSTOSCOPY, left retograde pyelogram WITH left ureteral  STENT PLACEMENT;  Surgeon: Angela Jabs, MD;  Location: [name];  Service: Urology;  Laterality: Left;  . CYSTOSCOPY/RETROGRADE/URETEROSCOPY  12/04/2011   Procedure: CYSTOSCOPY/RETROGRADE/URETEROSCOPY;  Surgeon: Angela Hazard, MD;  Location: [name];  Service: Urology;  Laterality: Right;  . CYSTOSCOPY/RETROGRADE/URETEROSCOPY/STONE EXTRACTION WITH BASKET Left 09/03/2012   Procedure: CYSTOSCOPY/RETROGRADE pyelogram/digital URETEROSCOPY/STONE EXTRACTION WITH BASKET, left stent removal;  Surgeon: Angela Hazard, MD;  Location: [name];  Service: Urology;  Laterality: Left;  . ESOPHAGOGASTRODUODENOSCOPY  2008   DTO:IZTIW hiatal hernia./Normal esophagus without evidence of Barrett's mass, stricture, erosion or ulceration./Normal duodenal bulb and second portion of the  duodenum./Diffuse erythema in the body and the antrum with occasional erosion.  Biopsies obtained via cold forceps to evaluate for H. pylori gastritis  . FRACTURE SURGERY  1999   right clavicle  . HOLMIUM LASER APPLICATION Left 08/01/3293   Procedure: HOLMIUM LASER APPLICATION;  Surgeon: Angela Hazard, MD;  Location: [name];  Service: Urology;  Laterality: Left;  . KNEE ARTHROSCOPY  10/04/2010,    right knee arthroscopy, dr Angela [name]  . LEFT HEART CATH AND CORONARY ANGIOGRAPHY N/A 05/08/2018   Procedure: LEFT HEART CATH AND CORONARY ANGIOGRAPHY;  Surgeon: Angela Sine, MD;  Location: [name];  Service: Cardiovascular;  Laterality: N/A;  . left knee arthroscopic surgery  1999  . Left salphingectomy secondary to ectopic pregnancy  1991  .  PARTIAL HYSTERECTOMY  1991  . rotary cuff  Right 05/14/2014   Ang[name]  . UPPER GASTROINTESTINAL ENDOSCOPY  2008 abd pain   H. pylori gastritis  . UPPER GASTROINTESTINAL ENDOSCOPY  1996 [facility]   PUD       HPI  from the history and physical done on the day of admission:     Angela [name] a 58 y.o.femalewith [name] history significant ofHTN, HLD, nonobstructive CAD, DM type II, chronic pain, morbid obesity, and OSA on CPAP; who presents with complaints of chest pain. Symptoms started around 10:45 PM 2 nights ago while laying down. She reports having dull aching pain in her left arm and feeling that someone was standing on her chest. She tried to get up and get some air, but symptoms persisted. Complained of associated symptoms of diaphoresis, shortness of breath, and nausea. Hernitroglycerin was outdated. She tried to rest without much improvement.She reports having a previous history of 4 heart attacks, but denies any stents ever being placed. Notes that she does not currently want to undergo any procedure due to her hypertension and diabetes. Previously followed by Angela [name] of cardiology and had mild nonobstructive disease by cardiac cath done by Angela [name] in 2008. She is amenable to coming in to the hospital and being evaluated and will reconsider needed further testing when that time comes.  Enroute with EMS patient had been given an324 mg aspirin to chew and 1 sublingual nitroglycerin.She reports that she should not have taken aspirin as she has allergy to aspirin due to hiatal hernia.    Hospital Course:    Chest pain:Patient presents with complaints of acute chest painwith radiation to her left arm.Troponins negative x3. EKG showed no significant signs of ischemia. Previous report of mild nonobstructive disease by cardiac cath in 2008 by Angela [name].Risk factors include hypertension, diabetes, morbid obesity.Cardiology consulted and patient underwent  cardiac cath 05/08/2018 which revealed nonobstructive coronary artery disease.  Patient started on Metoprolol during hospitalization, and  -Follow-up with cardiology in outpatient setting.  Nonobstructive coronary artery disease:Patient reports allergy to aspirin.  Cath as noted above noted - Continue statin  Hyperlipidemia: Lipid profile on 05/06/2018 revealed total cholesterol 174, HDL 49, LDL 109, triglycerides 80. Lipitor was recommended to be increased from 10 to 40 mg daily per cardiology. - Continue increased dose of Lipitor  Hemoptysis: Patient tube to complaints of hemoptysis. D-dimer negative no further work-up pursued at that time as previous x-ray from 15- for any acute abnormality.    Essential hypertension: Blood pressures appearelevated up to 171/98 on admission.Cardiology recommended starting low-dose metoprolol due to history of dysrhythmia.  During hospitalization appears that patient was on a lower dose of triamterene hydrochlorothiazide -Continue amlodipine,  clonidine patch, and triamterene-hydrochlorothiazide(per current home dose) and -Continue metoprolol   Hypothyroidism: Patient with previous history of radiationfor treatment for hypothyroidism. TSH noted to be elevated at 7.22 on 10/08/2017.Patient not on oral medications of levothyroxine. Repeat check 6.946 on 05/06/2018. Question of possibility of subclinical hypothyroidism.  Free T4 noted to be mildly depressed at 0.81. - Follow-up with primary care provider to determine if appropriate to start on levothyroxine or to continue to monitor  Diabetes mellitus type 2: Patientcurrently onmetformin.  Blood glucose 130-150's with her last hemoglobin A1c being 7.4 on 02/06/2018. - Restart metformin at discharge and follow-up with primary care provider  Asthma, without acute exacerbation: During hospitalization patient did not have any significant respiratory issues.  Symbicort was replaced by nebulization -  Continue home regimen of Symbicort inhaler, Claritin, Flonase, and Singulair  Chronic pain -Continue Cymbalta, oxycodone, gabapentin - Prescription printed in epic for 2 pills of oxycodone 40 mg in error will not be given to patient.  Anxiety -Continue Valium as needed for anxiety  Psoriasis -Continue hydroxyzineat bedtime  Thrombocytosis:Resolved. Platelet count mildly elevated at 434admission, but now resolved.  Morbid obesity: Body mass index is 52.8 kg/m.    Follow UP  Follow-up Information    [facility] Follow up.   Specialty:  Cardiology Why:  You have a hospital follow-up visit scheduled for Tuesday 05/21/2018 at 9:00am. Contact information: [address] [place] [phone]       Elouise Munroe, MD Follow up.   Specialty:  Cardiology Why:  You have a follow-up visit scheduled with Dr. Margaretann Loveless on Wednesday 08/07/2018 at 9:00am. Contact information: [address] [place] [address] [phone]        Simpson, Margaret E, MD Follow up in 1 week(s).   Specialty:  Family Medicine Contact information: [address] [place] [address] [phone]            Consults obtained - cardiology  Discharge Condition: Good  Diet and Activity recommendation: See Discharge Instructions below  Discharge Instructions    Discharge Instructions    Diet - low sodium heart healthy   Complete by:  As directed    Discharge instructions   Complete by:  As directed    I [facility] prescriptions to your [facility] [place] on [facility].  Prescriptions are for 1 month.  Your primary care provider or cardiology will need to continue these medications at your next visit.  Please have repeat BMP obtained in 1 week.   Increase activity slowly   Complete by:  As directed         Discharge Medications     Allergies as of 05/09/2018      Reactions   Morphine Other (See  Comments)   Reaction with esophagus, unable to swallow.    Ace Inhibitors Cough   Aspirin Other (See Comments)   Reaction with esophagus, unable to swallow.    Losartan Cough   Tapentadol Other (See Comments)   Nausea, increased sleepiness, h/a   Tomato Other (See Comments)   Acid reflux due to acid in tomato   Adhesive [tape] Rash      Medication List    STOP taking these medications   azelastine 0.1 % nasal spray Commonly known as:  ASTELIN   fluconazole 150 MG tablet Commonly known as:  DIFLUCAN   furosemide 40 MG tablet Commonly known as:  LASIX   hydrocortisone 2.5 % rectal cream  Commonly known as:  ANUSOL-HC   triamcinolone cream 0.1 % Commonly known as:  KENALOG     TAKE these medications   albuterol 108 (90 Base) MCG/ACT inhaler Commonly known as:  PROAIR HFA INHALE 2 PUFFS BY MOUTH EVERY 6-8HRS AS NEEDED What changed:    how much to take  how to take this  when to take this  reasons to take this  additional instructions   amLODipine 10 MG tablet Commonly known as:  NORVASC TAKE 1 TABLET BY MOUTH EVERY DAY   atorvastatin 40 MG tablet Commonly known as:  LIPITOR Take 1 tablet (40 mg total) by mouth daily. Start taking on:  May 10, 2018 What changed:    medication strength  how much to take   budesonide-formoterol 160-4.5 MCG/ACT inhaler Commonly known as:  SYMBICORT Inhale 2 puffs into the lungs 2 (two) times daily.   cloNIDine 0.3 mg/24hr patch Commonly known as:  CATAPRES - Dosed in mg/24 hr Place 1 patch (0.3 mg total) onto the skin once a week. What changed:  Another medication with the same name was changed. Make sure you understand how and when to take each.   cloNIDine 0.3 MG tablet Commonly known as:  CATAPRES TAKE ONE TABLET AT 9 PM EVERY NIGHT FOR HIGH BLOOD PRESSURE What changed:    how much to take  how to take this  when to take this  additional instructions   cyclobenzaprine 10 MG tablet Commonly known as:   FLEXERIL TAKE 1 TABLET BY MOUTH 3 TIMES A DAY   diazepam 5 MG tablet Commonly known as:  VALIUM One tablet three times daily for anxiety and muscle spasm What changed:    how much to take  how to take this  when to take this  additional instructions  Another medication with the same name was removed. Continue taking this medication, and follow the directions you see here.   DULoxetine 60 MG capsule Commonly known as:  CYMBALTA TAKE 1 CAPSULE BY MOUTH TWICE A DAY   ergocalciferol 1.25 MG (50000 UT) capsule Commonly known as:  VITAMIN D2 Take 1 capsule (50,000 Units total) by mouth once a week. One capsule once weekly   esomeprazole 40 MG capsule Commonly known as:  NEXIUM TAKE 1 CAPSULE (40 MG TOTAL) BY MOUTH DAILY. What changed:  See the new instructions.   fluticasone 50 MCG/ACT nasal spray Commonly known as:  FLONASE Place 2 sprays into both nostrils daily. What changed:    when to take this  reasons to take this   gabapentin 800 MG tablet Commonly known as:  NEURONTIN TAKE 1 TABLET BY MOUTH 4 TIMES A DAY   hydrOXYzine 10 MG tablet Commonly known as:  ATARAX/VISTARIL Take 10-30 mg by mouth at bedtime.   ipratropium-albuterol 0.5-2.5 (3) MG/3ML Soln Commonly known as:  DUONEB Take 3 mLs by nebulization every 6 (six) hours as needed. What changed:  reasons to take this   KLOR-CON M20 20 MEQ tablet Generic drug:  potassium chloride SA TAKE 1 TABLET (20 MEQ TOTAL) BY MOUTH 2 (TWO) TIMES DAILY. What changed:  See the new instructions.   loratadine 10 MG tablet Commonly known as:  CLARITIN Take 1 tablet (10 mg total) by mouth daily.   meclizine 25 MG tablet Commonly known as:  ANTIVERT Take 25 mg by mouth 3 (three) times daily as needed for dizziness.   metFORMIN 1000 MG tablet Commonly known as:  GLUCOPHAGE TAKE 1 TABLET BY MOUTH TWICE A DAY  WITH FOOD What changed:  when to take this   metoprolol tartrate 25 MG tablet Commonly known as:   LOPRESSOR Take 0.5 tablets (12.5 mg total) by mouth 2 (two) times daily.   montelukast 10 MG tablet Commonly known as:  SINGULAIR TAKE 1 TABLET (10 MG TOTAL) BY MOUTH AT BEDTIME.   nitroGLYCERIN 0.4 MG SL tablet Commonly known as:  NITROSTAT Place 1 tablet (0.4 mg total) under the tongue every 5 (five) minutes as needed for chest pain. What changed:  medication strength   oxyCODONE 40 mg 12 hr tablet Commonly known as:  OXYCONTIN Take 1 tablet (40 mg total) by mouth every 12 (twelve) hours.   triamterene-hydrochlorothiazide 75-50 MG tablet Commonly known as:  MAXZIDE TAKE 1 TABLET BY MOUTH DAILY.   zolpidem 10 MG tablet Commonly known as:  AMBIEN Take 1 tablet (10 mg total) by mouth at bedtime.       [facility] procedures and Radiology Reports - PLEASE review detailed and final reports for all details, in brief -    Dg Chest Portable 1 View  Result Date: 05/05/2018 CLINICAL DATA:  Nonradiating chest pain beginning last night EXAM: PORTABLE CHEST 1 VIEW COMPARISON:  07/13/2013 FINDINGS: Stable cardiomegaly with mild uncoiling of the thoracic aorta. No acute pulmonary consolidation, effusion or pneumothorax. Resected appearance of the distal right clavicle with mild AC joint osteoarthritis on the left. IMPRESSION: No active disease.  Stable mild cardiomegaly. Electronically Signed   By: Ashley Royalty M.D.   On: 05/05/2018 23:00    Micro Results   No results found for this or any previous visit (from the past 240 hour(s)).     Today   Subjective    Angela [name] today has no chest complaints and is feeling better ready to go home.  Request that she get refills on nitroglycerin.   Objective   Blood pressure (!) 159/88, pulse 69, temperature 98.1 F (36.7 C), temperature source Oral, resp. rate 14, height 5\' 4"  (1.626 m), weight (!) 139.5 kg, SpO2 (!) 63 %.   Intake/Output Summary (Last 24 hours) at 05/09/2018 1929 Last data filed at 05/09/2018 1000 Gross per 24 hour  Intake  240 ml  Output -  Net 240 ml    Exam  Constitutional: NAD, calm, comfortable Eyes: PERRL, lids and conjunctivae normal ENMT: Mucous membranes are moist. Posterior pharynx clear of any exudate or lesions.Normal dentition.  Neck: normal, supple, no masses, no thyromegaly Respiratory: clear to auscultation bilaterally, no wheezing, no crackles. Normal respiratory effort. No accessory muscle use.  Cardiovascular: Regular rate and rhythm, no murmurs / rubs / gallops. No extremity edema. 2+ pedal pulses. No carotid bruits.  Abdomen: no tenderness, no masses palpated. No hepatosplenomegaly. Bowel sounds positive.  Musculoskeletal: no clubbing / cyanosis. No joint deformity upper and lower extremities. Good ROM, no contractures. Normal muscle tone.  Skin: no rashes, lesions, ulcers. No induration Neurologic: CN 2-12 grossly intact. Sensation intact, DTR normal. Strength 5/5 in all 4.  Psychiatric: Normal judgment and insight. Alert and oriented x 3. Normal mood.    Data Review   CBC w Diff:  [name] Results  Component Value Date   WBC 5.9 05/09/2018   HGB 12.3 05/09/2018   HCT 40.0 05/09/2018   PLT 381 05/09/2018   LYMPHOPCT 36 05/06/2018   BANDSPCT 0 05/05/2018   MONOPCT 6 05/06/2018   EOSPCT 2 05/06/2018   BASOPCT 0 05/06/2018    CMP:  [name] Results  Component Value Date   NA 136  05/09/2018   K 4.1 05/09/2018   CL 101 05/09/2018   CO2 28 05/09/2018   BUN 8 05/09/2018   CREATININE 0.81 05/09/2018   CREATININE 0.80 02/06/2018   PROT 8.0 05/05/2018   ALBUMIN 3.9 05/05/2018   BILITOT 0.6 05/05/2018   ALKPHOS 63 05/05/2018   AST 19 05/05/2018   ALT 24 05/05/2018  .   Total Time in preparing paper work, data evaluation and todays exam - 35 minutes  Angela [name] M.D on 05/09/2018 at 7:29 PM  [facility]   Office  [phone]

## 2018-05-09 NOTE — Progress Notes (Signed)
Progress Note    Angela [name]  DVV:[id number] DOB: October 24, 1960  DOA: 05/05/2018 PCP: Fayrene Helper, MD    Brief Narrative:   Chief complaint: Chest pain  Medical records reviewed and are as summarized below:  Angela Burtch is an 58 y.o. female with medical history significant of HTN, HLD, nonobstructive CAD, DM type II, chronic pain, morbid obesity, and OSA on CPAP; who presents with complaints of chest pain.  Symptoms started around 10:45 PM 2 nights ago while laying down.  She reports having dull aching pain in her left arm and feeling that someone was standing on her chest.  She tried to get up and get some air, but symptoms persisted.  Complained of associated symptoms of diaphoresis, shortness of breath, and nausea. Her nitroglycerin was outdated.  She tried to rest without much improvement.  She reports having a previous history of 4 heart attacks, but denies any stents ever being placed.  Notes that she does not currently want to undergo any procedure due to her hypertension and diabetes.  Previously followed by Dr. Montez Morita of cardiology and had mild nonobstructive disease by cardiac cath done by Dr. Einar Gip in 2008.  She is amenable to coming in to the hospital and being evaluated and will reconsider needed further testing when that time comes.  Assessment/Plan:   Principal Problem:   Chest pain Active Problems:   Diabetes mellitus without complication (HCC)   Hyperlipidemia LDL goal <70   Morbid obesity (HCC)   Essential hypertension   Coronary atherosclerosis of native coronary artery   Chronic pain  Chest pain: Patient presents with complaints of acute chest pain with radiation to her left arm.  Troponins negative x3.  EKG showed no significant signs of ischemia.  Previous report of mild nonobstructive disease by cardiac cath in 2008 by Dr. Einar Gip.  Risk factors include hypertension, diabetes, morbid obesity.  Cardiology consulted and patient now appears willing to undergo  cardiac cath. - cardiac telemetry bed - Follow-up results of catheterization - Continue with IV fluid hydration precath - Nitroglycerin as needed  CAD: Patient reports allergy to aspirin.  Hemoptysis: Patient also reported complaints of hemoptysis.  D-dimer negative   Essential hypertension: Blood pressures appear elevated up to 171/98 on admission.  Cardiology recommended starting low-dose metoprolol due to history of dysrhythmia. - Continue amlodipine, clonidine patch, and triamterene -hydrochlorothiazide  - Continue metoprolol  Hyperthyroidism: Patient with previous history of radiation for treatment.  TSH noted to be elevated at 7.22 on 10/08/2017.  Patient not on oral medications of levothyroxine.  Repeat check 6.946 on 05/06/2018.  Question of possibility of subclinical hypothyroidism. - Add-on free T4  Diabetes mellitus type 2: Patient currently on  metformin. Initial blood glucose 154 with her last hemoglobin A1c being 7.4 on 02/06/2018. - Hypoglycemic protocol - Hold metformin - CBGs q. before meals with sensitive SSI  Chronic pain - Continue Cymbalta, oxycodone, gabapentin  Anxiety - Continue Valium as needed for anxiety  Hyperlipidemia: Lipid profile on 05/06/2018 revealed total cholesterol 174, HDL 49, LDL 109, triglycerides 80.  Lipitor was recommended to be increased from 10 to 40 mg daily per cardiology. - Continue Lipitor   Psoriasis - Continue hydroxyzine at bedtime  Thrombocytosis: Resolved.  Platelet count mildly elevated at 434 admission, but now resolved.  Morbid obesity: Body mass index is 52.8 kg/m.   Family Communication/Anticipated D/C date and plan/Code Status   DVT prophylaxis: Lovenox ordered. Code Status: Full Code.  Family Communication: No family present  bedside Disposition Plan: Likely discharge home in a.m. if work-up negative   Medical Consultants:   Cardiology  Anti-Infectives:    None  Subjective:   Patient is now  willing to undergo cardiac catheterization.  She states that she does not want to have a stent placed, but is okay with them opening up the blood effect so that is clogged if needed.  Just does not want a stent. Objective:    Vitals:   05/08/18 2000 05/08/18 2017 05/08/18 2121   BP:  (!) 127/56    Pulse: 85 81    Resp: (!) 22 19    Temp:  98.5 F (36.9 C) 98.4 F (36.9 C)   TempSrc:  Axillary Oral   SpO2: 98% 96%    Weight:      Height:        Intake/Output Summary (Last 24 hours) at 05/09/2018 0549 Last data filed at 05/08/2018 1600 Gross per 24 hour  Intake 1525.17 ml  Output -  Net 1525.17 ml   Filed Weights   05/05/18 2153   Weight: (!) 140.6 kg     Exam: Constitutional: NAD, calm, comfortable Eyes: PERRL, lids and conjunctivae normal ENMT: Mucous membranes are dry. Posterior pharynx clear of any exudate or lesions. Normal dentition.  Neck: normal, supple, no masses, no thyromegaly Respiratory: clear to auscultation bilaterally, no wheezing, no crackles. Normal respiratory effort. No accessory muscle use.  Cardiovascular: Regular rate and rhythm, no murmurs / rubs / gallops. No extremity edema. 2+ pedal pulses. No carotid bruits.  Abdomen: no tenderness, no masses palpated. No hepatosplenomegaly. Bowel sounds positive.  Musculoskeletal: no clubbing / cyanosis. No joint deformity upper and lower extremities. Good ROM, no contractures. Normal muscle tone.  Skin: no rashes, lesions, ulcers. No induration Neurologic: CN 2-12 grossly intact. Sensation intact, DTR normal. Strength 5/5 in all 4.  Psychiatric: Normal judgment and insight. Alert and oriented x 3.  Anxious mood.    Data Reviewed:   I have personally reviewed following labs and imaging studies:  Labs: Labs show the following:   Basic Metabolic Panel: Recent Labs  Lab 05/05/18 2250 05/06/18 0335 05/08/18 0006   NA 136 138 135   K 3.5 3.8 3.4*   CL 99 105 99   CO2 24 20* 27   GLUCOSE 154* 144* 139*    BUN 17 17 10    CREATININE 0.99 0.85 0.80   CALCIUM 9.6 9.6 9.3    GFR Estimated Creatinine Clearance: 105.9 mL/min (by C-G formula based on SCr of 0.81 mg/dL). Liver Function Tests: Recent Labs  Lab 05/05/18 2250  AST 19  ALT 24  ALKPHOS 63  BILITOT 0.6  PROT 8.0  ALBUMIN 3.9   No results for input(s): LIPASE, AMYLASE in the last 168 hours. No results for input(s): AMMONIA in the last 168 hours. Coagulation profile No results for input(s): INR, PROTIME in the last 168 hours.  CBC: Recent Labs  Lab 05/05/18 2250 05/06/18 0335   WBC 6.8 7.1   NEUTROABS 3.4 3.9   HGB 13.1 12.9   HCT 41.3 40.8   MCV 81.9 81.9   PLT 434* 440*    Cardiac Enzymes: Recent Labs  Lab 05/05/18 2250 05/06/18 0112 05/06/18 0335 05/06/18 0638  TROPONINI <0.03 <0.03 <0.03 <0.03   BNP (last 3 results) No results for input(s): PROBNP in the last 8760 hours. CBG: Recent Labs  Lab 05/07/18 2113 05/08/18 0705 05/08/18 1134 05/08/18 1601 05/08/18 2105  GLUCAP 189* 139* 131* 127* 163*  D-Dimer: Recent Labs    05/06/18 1519  DDIMER 0.33   Hgb A1c: No results for input(s): HGBA1C in the last 72 hours. Lipid Profile: No results for input(s): CHOL, HDL, LDLCALC, TRIG, CHOLHDL, LDLDIRECT in the last 72 hours. Thyroid function studies: Recent Labs    05/06/18 0638  TSH 6.946*   Anemia work up: No results for input(s): VITAMINB12, FOLATE, FERRITIN, TIBC, IRON, RETICCTPCT in the last 72 hours. Sepsis Labs: Recent Labs  Lab 05/05/18 2250 05/06/18 0335   WBC 6.8 7.1     Microbiology No results found for this or any previous visit (from the past 240 hour(s)).  Procedures and diagnostic studies:  No results found.  Medications:   . amLODipine  10 mg Oral Daily  . arformoterol  15 mcg Nebulization BID  . atorvastatin  40 mg Oral Daily  . azithromycin  500 mg Oral Daily  . budesonide (PULMICORT) nebulizer solution  0.5 mg Nebulization BID  . cloNIDine  0.3 mg Transdermal  Q Wed  . diazepam  5 mg Oral TID  . DULoxetine  60 mg Oral BID  . enoxaparin (LOVENOX) injection  40 mg Subcutaneous Daily  . gabapentin  800 mg Oral TID AC & HS  . hydrOXYzine  10-30 mg Oral QHS  . insulin aspart  0-9 Units Subcutaneous TID WC  . metoprolol tartrate  12.5 mg Oral BID  . montelukast  10 mg Oral QHS  . oxyCODONE  40 mg Oral Q12H  . pantoprazole  40 mg Oral Daily  . potassium chloride SA  20 mEq Oral BID  . potassium chloride  40 mEq Oral Once  . sodium chloride flush  3 mL Intravenous Q12H  . triamterene-hydrochlorothiazide  1 tablet Oral Daily   Continuous Infusions: . sodium chloride 150 mL/hr at 05/08/18 1558  . sodium chloride       LOS: 0 days   Rondell A Smith  [facility]   *Please refer to [email or web address], password [email or web address] to get updated schedule on who will round on this patient, as hospitalists switch teams weekly. If 7PM-7AM, please contact night-coverage at [email or web address], password [email or web address] for any overnight needs.

## 2018-05-10 ENCOUNTER — Telehealth: Payer: Self-pay | Admitting: *Deleted

## 2018-05-10 ENCOUNTER — Other Ambulatory Visit: Payer: Self-pay | Admitting: Family Medicine

## 2018-05-10 ENCOUNTER — Other Ambulatory Visit: Payer: Self-pay

## 2018-05-10 MED ORDER — OXYCODONE HCL ER 40 MG PO T12A: EXTENDED_RELEASE_TABLET | ORAL | 0 refills | Status: DC

## 2018-05-10 NOTE — Telephone Encounter (Signed)
Please advise 

## 2018-05-10 NOTE — Telephone Encounter (Signed)
Completed let her known

## 2018-05-10 NOTE — Telephone Encounter (Signed)
Pt needs a refill on oxycotin. She is completely out.

## 2018-05-14 ENCOUNTER — Telehealth: Payer: Self-pay | Admitting: Family Medicine

## 2018-05-14 NOTE — Telephone Encounter (Signed)
Pt is calling regarding Authorization on medication

## 2018-05-14 NOTE — Telephone Encounter (Signed)
Brandi handling this

## 2018-05-15 ENCOUNTER — Encounter: Payer: Self-pay | Admitting: Family Medicine

## 2018-05-15 ENCOUNTER — Ambulatory Visit (INDEPENDENT_AMBULATORY_CARE_PROVIDER_SITE_OTHER): Payer: Medicare Other | Admitting: Family Medicine

## 2018-05-15 VITALS — BP 110/70 | HR 67 | Resp 16 | Ht 64.0 in | Wt 314.0 lb

## 2018-05-15 DIAGNOSIS — Z09 Encounter for follow-up examination after completed treatment for conditions other than malignant neoplasm: Secondary | ICD-10-CM

## 2018-05-15 DIAGNOSIS — I10 Essential (primary) hypertension: Secondary | ICD-10-CM

## 2018-05-15 DIAGNOSIS — E785 Hyperlipidemia, unspecified: Secondary | ICD-10-CM | POA: Diagnosis not present

## 2018-05-15 DIAGNOSIS — R040 Epistaxis: Secondary | ICD-10-CM

## 2018-05-15 DIAGNOSIS — M62830 Muscle spasm of back: Secondary | ICD-10-CM | POA: Diagnosis not present

## 2018-05-15 DIAGNOSIS — E119 Type 2 diabetes mellitus without complications: Secondary | ICD-10-CM | POA: Diagnosis not present

## 2018-05-15 HISTORY — DX: Epistaxis: R04.0

## 2018-05-15 NOTE — Patient Instructions (Signed)
F/U in 12 weeks, call if you need me before   Please call and verify with nurse Brandi the metoprolol and triamterene tablet doses you are taking  We will work on pain medication  Chem 7 and EGFr and magnesium today  You are being referred to ENT for nose bleed from left nostril  Fasting lipid, cmp and EGFR and HBA1C 5 days before next visit

## 2018-05-16 ENCOUNTER — Ambulatory Visit: Payer: Self-pay | Admitting: Nutrition

## 2018-05-19 ENCOUNTER — Encounter: Payer: Self-pay | Admitting: Family Medicine

## 2018-05-19 DIAGNOSIS — Z09 Encounter for follow-up examination after completed treatment for conditions other than malignant neoplasm: Secondary | ICD-10-CM | POA: Insufficient documentation

## 2018-05-19 NOTE — Progress Notes (Signed)
   Angela [name]     MRN: [id number]      DOB: 1960-12-10   HPI [name] is here for follow up of hospitalization from 01/05 to 05/09/2018 with a c/o chest pain. Hospital course and results of tests are reviewed with the patient and  Angela [name] who accompanies Angela I explained to pt that NO she did NOT have a heart attack , as was being stated by Angela repeatedly Med reconciliation is attempted, she unfortunately does not have Angela medication and cannot state for certain what she is taking , she is to call back Has concerns re pain management, having to PA Angela current regime once more C/o left sided epistaxis, no trauma to nostril reported ROS Denies recent fever or chills. Denies sinus pressure, nasal congestion, ear pain or sore throat. Denies chest congestion, productive cough or wheezing. Denies current chest pains, palpitations and leg swelling Denies abdominal pain, nausea, vomiting,diarrhea or constipation.   Denies dysuria, frequency, hesitancy or incontinence. C/o chronic  joint pain, swelling and limitation in mobility. Denies headaches, seizures, numbness, or tingling. Denies depression, anxiety or insomnia. Denies skin break down or rash.   PE  BP 110/70   Pulse 67   Resp 16   Ht 5\' 4"  (1.626 m)   Wt (!) 314 lb (142.4 kg)   SpO2 98% Comment: room air  BMI 53.90 kg/m   Patient alert and oriented and in no cardiopulmonary distress.  HEENT: No facial asymmetry, EOMI,   oropharynx pink and moist.  Neck supple no JVD, no mass.  Chest: Clear to auscultation bilaterally.  CVS: S1, S2 no murmurs, no S3.Regular rate.  ABD: Soft non tender.   Ext: No edema  MS: decreased ROM spine, shoulders, hips and knees.  Skin: Intact, no ulcerations or rash noted.  Psych: Good eye contact, normal affect. Memory intact not anxious or depressed appearing.  CNS: CN 2-12 intact, power,  normal throughout.no focal deficits noted.   [facility] discharge  follow-up Patient in for follow up of recent hospitalization. Discharge summary, and laboratory and radiology data are reviewed, and any questions or concerns about recent hospitalization are discussed. Specific issues requiring follow up are specifically addressed.   Essential hypertension Controlled, no change in medication DASH diet and commitment to daily physical activity for a minimum of 30 minutes discussed and encouraged, as a part of hypertension management. The importance of attaining a healthy weight is also discussed.  BP/Weight 05/15/2018 05/09/2018 04/10/2018 04/03/2018 02/07/2018 11/14/2017 1/44/3154  Systolic BP 008 676 195 - 093 267 124  Diastolic BP 70 88 89 - 82 94 90  Wt. (Lbs) 314 307.6 315 312 314.04 307 305  BMI 53.9 52.8 54.07 53.55 53.9 52.7 52.35       Morbid obesity (HCC) Deteriorated. Patient re-educated about  the importance of commitment to a  minimum of 150 minutes of exercise per week.  The importance of healthy food choices with portion control discussed. Encouraged to start a food diary, count calories and to consider  joining a support group. Sample diet sheets offered. Goals set by the patient for the next several months.   Weight /BMI 05/15/2018 05/09/2018 05/05/2018  WEIGHT 314 lb 307 lb 9.6 oz -  HEIGHT 5\' 4"  - 5\' 4"   BMI 53.9 kg/m2 - 52.8 kg/m2      Left-sided epistaxis Single episode of significant left epistaxis, refer to ENT for further eval and management

## 2018-05-19 NOTE — Assessment & Plan Note (Signed)
Single episode of significant left epistaxis, refer to ENT for further eval and management

## 2018-05-19 NOTE — Assessment & Plan Note (Signed)
Controlled, no change in medication DASH diet and commitment to daily physical activity for a minimum of 30 minutes discussed and encouraged, as a part of hypertension management. The importance of attaining a healthy weight is also discussed.  BP/Weight 05/15/2018 05/09/2018 04/10/2018 04/03/2018 02/07/2018 11/14/2017 8/86/4847  Systolic BP 207 218 288 - 337 445 146  Diastolic BP 70 88 89 - 82 94 90  Wt. (Lbs) 314 307.6 315 312 314.04 307 305  BMI 53.9 52.8 54.07 53.55 53.9 52.7 52.35

## 2018-05-19 NOTE — Assessment & Plan Note (Signed)
Patient in for follow up of recent hospitalization. Discharge summary, and laboratory and radiology data are reviewed, and any questions or concerns about recent hospitalization are discussed. Specific issues requiring follow up are specifically addressed.  

## 2018-05-19 NOTE — Assessment & Plan Note (Signed)
Deteriorated. Patient re-educated about  the importance of commitment to a  minimum of 150 minutes of exercise per week.  The importance of healthy food choices with portion control discussed. Encouraged to start a food diary, count calories and to consider  joining a support group. Sample diet sheets offered. Goals set by the patient for the next several months.   Weight /BMI 05/15/2018 05/09/2018 05/05/2018  WEIGHT 314 lb 307 lb 9.6 oz -  HEIGHT 5\' 4"  - 5\' 4"   BMI 53.9 kg/m2 - 52.8 kg/m2

## 2018-05-20 NOTE — Progress Notes (Signed)
Cardiology Office Note   Date:  05/21/2018   ID:  Angela [name], DOB 1961-03-24, MRN [id number]  PCP:  Angela Helper, MD  Cardiologist:  Angela [name]   Chief Complaint  Patient presents with  . Follow-up    pt c/o pain in left arm  . Shortness of Breath     History of Present Illness: Angela [name] is a 58 y.o. female who presents for post hospital follow up after being seen on consultation for chest pain. She has a hx of HTN, Type II diabetes, and hyperlipidemia.   She was previously seen by Dr. Harl [name] in 06/2013 for same, with symptoms of chest pain with and without exertion. On evaluation with Dr. Margaretann [name] she described sudden onset of left shoulder dullness and heaviness in her chest "like someone sitting on my chest." She had associated nausea,diaphoresis, and palpitations. She was diagnosed with unstable angina and planned for cardiac cath.   Cardiac cath on 05/08/2018 revealed proximal and mid LAD lesion of 10%, mild LAD of 20 %.  Normal LVEF. She was recommended for medical management with BP control and lipid lowering therapy with target LDL < 70.    She comes today without any cardiac complaints. She still has some arm soreness and trouble with ROM on the left. She is medically complaint. She is now on a weight loss and walking regimen.   Past Medical History:  Diagnosis Date  . Anxiety   . Arthritis   . Asthma   . Coronary atherosclerosis of native coronary artery    Mild nonobstructive CAD by cardiac catheterization 2008 - Dr. Terrence Dupont  . Degenerative disc disease   . Depression   . Dysrhythmia   . Essential hypertension, benign   . Folliculitis 08/31/7739  . GERD (gastroesophageal reflux disease)   . H/O hiatal hernia   . Helicobacter pylori gastritis 2008  . Hypercholesterolemia   . Hypertension   . Hyperthyroidism    s/p radiation  . Low back pain   . Migraine   . Nephrolithiasis    Recurring episodes since 2004  . Severe obesity (BMI >= 40) (HCC)   . Sleep  apnea    STOP BANG SCORE 6no cpap used  . Type 2 diabetes mellitus with diabetic neuropathy [facility])     Past Surgical History:  Procedure Laterality Date  . ABDOMINAL HYSTERECTOMY    . BACK SURGERY  1993  . BACK SURGERY  1994  . BACK SURGERY  2002  . BACK SURGERY  2011   [place] Left   . CHOLECYSTECTOMY  1996  . COLONOSCOPY  2000 [facility]   INT HEMORRHOIDS/FISSURE  . COLONOSCOPY  2003 [facility], CHANGE IN BOWEL HABITS   INT HEMORRHOIDS  . COLONOSCOPY  2006 [facility]   INT HEMORRHOIDS  . COLONOSCOPY  2007 [facility]   INT HEMORRHOIDS  . CYSTOSCOPY WITH STENT PLACEMENT Left 07/25/2012   Procedure: CYSTOSCOPY, left retograde pyelogram WITH left ureteral  STENT PLACEMENT;  Surgeon: Claybon Jabs, MD;  Location: [facility];  Service: Urology;  Laterality: Left;  . CYSTOSCOPY/RETROGRADE/URETEROSCOPY  12/04/2011   Procedure: CYSTOSCOPY/RETROGRADE/URETEROSCOPY;  Surgeon: Molli Hazard, MD;  Location: [facility];  Service: Urology;  Laterality: Right;  . CYSTOSCOPY/RETROGRADE/URETEROSCOPY/STONE EXTRACTION WITH BASKET Left 09/03/2012   Procedure: CYSTOSCOPY/RETROGRADE pyelogram/digital URETEROSCOPY/STONE EXTRACTION WITH BASKET, left stent removal;  Surgeon: Molli Hazard, MD;  Location: [facility];  Service: Urology;  Laterality: Left;  . ESOPHAGOGASTRODUODENOSCOPY  2008   OIN:OMVEH hiatal hernia./Normal esophagus without  evidence of Barrett's mass, stricture, erosion or ulceration./Normal duodenal bulb and second portion of the duodenum./Diffuse erythema in the body and the antrum with occasional erosion.  Biopsies obtained via cold forceps to evaluate for H. pylori gastritis  . FRACTURE SURGERY  1999   right clavicle  . HOLMIUM LASER APPLICATION Left 08/01/5954   Procedure: HOLMIUM LASER APPLICATION;  Surgeon: Molli Hazard, MD;  Location: [facility];  Service: Urology;  Laterality: Left;  . KNEE ARTHROSCOPY  10/04/2010,    right knee arthroscopy, dr Theda Sers  . LEFT HEART  CATH AND CORONARY ANGIOGRAPHY N/A 05/08/2018   Procedure: LEFT HEART CATH AND CORONARY ANGIOGRAPHY;  Surgeon: Angela Sine, MD;  Location: [place] [facility];  Service: Cardiovascular;  Laterality: N/A;  . left knee arthroscopic surgery  1999  . Left salphingectomy secondary to ectopic pregnancy  1991  . PARTIAL HYSTERECTOMY  1991  . rotary cuff  Right 05/14/2014   [facility]  . UPPER GASTROINTESTINAL ENDOSCOPY  2008 abd pain   H. pylori gastritis  . UPPER GASTROINTESTINAL ENDOSCOPY  1996 [place] [place]   PUD     Current Outpatient Medications  Medication Sig Dispense Refill  . albuterol (PROAIR HFA) 108 (90 Base) MCG/ACT inhaler INHALE 2 PUFFS BY MOUTH EVERY 6-8HRS AS NEEDED (Patient taking differently: Inhale 2 puffs into the lungs every 6 (six) hours as needed for wheezing or shortness of breath. ) 8.5 Inhaler 1  . amLODipine (NORVASC) 10 MG tablet TAKE 1 TABLET BY MOUTH EVERY DAY (Patient taking differently: Take 10 mg by mouth daily. ) 90 tablet 1  . atorvastatin (LIPITOR) 40 MG tablet Take 1 tablet (40 mg total) by mouth daily. 30 tablet 0  . budesonide-formoterol (SYMBICORT) 160-4.5 MCG/ACT inhaler Inhale 2 puffs into the lungs 2 (two) times daily. 1 Inhaler 3  . cloNIDine (CATAPRES - DOSED IN MG/24 HR) 0.3 mg/24hr patch Place 1 patch (0.3 mg total) onto the skin once a week. 4 patch 12  . cloNIDine (CATAPRES) 0.3 MG tablet TAKE ONE TABLET AT 9 PM EVERY NIGHT FOR HIGH BLOOD PRESSURE (Patient taking differently: Take 0.3 mg by mouth at bedtime. ) 90 tablet 1  . cyclobenzaprine (FLEXERIL) 10 MG tablet TAKE 1 TABLET BY MOUTH 3 TIMES A DAY (Patient taking differently: Take 10 mg by mouth 3 (three) times daily. ) 90 tablet 3  . diazepam (VALIUM) 5 MG tablet One tablet three times daily for anxiety and muscle spasm (Patient taking differently: Take 5 mg by mouth 3 (three) times daily. ) 90 tablet 5  . DULoxetine (CYMBALTA) 60 MG capsule TAKE 1 CAPSULE BY MOUTH TWICE A DAY (Patient taking  differently: Take 60 mg by mouth 2 (two) times daily. ) 180 capsule 0  . ergocalciferol (VITAMIN D2) 50000 units capsule Take 1 capsule (50,000 Units total) by mouth once a week. One capsule once weekly 12 capsule 1  . esomeprazole (NEXIUM) 40 MG capsule TAKE 1 CAPSULE (40 MG TOTAL) BY MOUTH DAILY. (Patient taking differently: Take 40 mg by mouth daily. ) 90 capsule 1  . fluticasone (FLONASE) 50 MCG/ACT nasal spray Place 2 sprays into both nostrils daily. (Patient taking differently: Place 2 sprays into both nostrils daily as needed for allergies. ) 16 g 6  . gabapentin (NEURONTIN) 800 MG tablet TAKE 1 TABLET BY MOUTH 4 TIMES A DAY (Patient taking differently: Take 800 mg by mouth 4 (four) times daily. ) 360 tablet 1  . hydrOXYzine (ATARAX/VISTARIL) 10 MG tablet Take 10-30 mg by  mouth at bedtime.   2  . ipratropium-albuterol (DUONEB) 0.5-2.5 (3) MG/3ML SOLN Take 3 mLs by nebulization every 6 (six) hours as needed. (Patient taking differently: Take 3 mLs by nebulization every 6 (six) hours as needed (SOB). ) 360 mL 1  . KLOR-CON M20 20 MEQ tablet TAKE 1 TABLET (20 MEQ TOTAL) BY MOUTH 2 (TWO) TIMES DAILY. (Patient taking differently: Take 20 mEq by mouth 2 (two) times daily. ) 180 tablet 1  . loratadine (CLARITIN) 10 MG tablet Take 1 tablet (10 mg total) by mouth daily. 30 tablet 5  . meclizine (ANTIVERT) 25 MG tablet Take 25 mg by mouth 3 (three) times daily as needed for dizziness.    . metFORMIN (GLUCOPHAGE) 1000 MG tablet TAKE 1 TABLET BY MOUTH TWICE A DAY WITH FOOD (Patient taking differently: Take 1,000 mg by mouth 2 (two) times daily. ) 180 tablet 1  . metoprolol tartrate (LOPRESSOR) 25 MG tablet Take 0.5 tablets (12.5 mg total) by mouth 2 (two) times daily. 60 tablet 0  . montelukast (SINGULAIR) 10 MG tablet TAKE 1 TABLET (10 MG TOTAL) BY MOUTH AT BEDTIME. 90 tablet 1  . nitroGLYCERIN (NITROSTAT) 0.4 MG SL tablet Place 1 tablet (0.4 mg total) under the tongue every 5 (five) minutes as needed for  chest pain. 30 tablet 0  . oxyCODONE (OXYCONTIN) 40 mg 12 hr tablet Take 1 tablet (40 mg total) by mouth every 12 (twelve) hours. 2 tablet 0  . triamterene-hydrochlorothiazide (MAXZIDE) 75-50 MG tablet TAKE 1 TABLET BY MOUTH DAILY. (Patient taking differently: Take 1 tablet by mouth daily. ) 90 tablet 0  . zolpidem (AMBIEN) 10 MG tablet Take 1 tablet (10 mg total) by mouth at bedtime. 30 tablet 5   No current facility-administered medications for this visit.     Allergies:   Morphine; Ace inhibitors; Aspirin; Losartan; Tapentadol; Tomato; and Adhesive [tape]    Social History:  The patient  reports that she has never smoked. She has never used smokeless tobacco. She reports that she does not drink alcohol or use drugs.   Family History:  The patient's family history includes Asthma in her mother; Cancer in her father and mother; Colon cancer in her father; Heart attack in her father; Heart disease in her mother; Hypertension in her mother.    ROS: All other systems are reviewed and negative. Unless otherwise mentioned in H&P    PHYSICAL EXAM: VS:  BP (!) 144/86   Pulse 76   Ht 5\' 4"  (1.626 m)   Wt (!) 314 lb (142.4 kg)   BMI 53.90 kg/m  , BMI Body mass index is 53.9 kg/m. GEN: Well nourished, well developed, in no acute distress, morbidly obese HEENT: normal Neck: no JVD, carotid bruits, or masses Cardiac:RRR; no murmurs, rubs, or gallops,no edema  Respiratory:  Clear to auscultation bilaterally, normal work of breathing GI: soft, nontender, nondistended, + BS MS: no deformity or atrophy Skin: warm and dry, no rash Neuro:  Strength and sensation are intact Psych: euthymic mood, full affect   EKG: Not completed this office visit.  Recent Labs: 05/05/2018: ALT 24 05/06/2018: TSH 6.946 05/09/2018: BUN 8; Creatinine, Ser 0.81; Hemoglobin 12.3; Platelets 381; Potassium 4.1; Sodium 136    Lipid Panel    Component Value Date/Time   CHOL 174 05/06/2018 0335   TRIG 80 05/06/2018  0335   HDL 49 05/06/2018 0335   CHOLHDL 3.6 05/06/2018 0335   VLDL 16 05/06/2018 0335   LDLCALC 109 (H) 05/06/2018 0335  LDLCALC 54 02/06/2018 1309      Wt Readings from Last 3 Encounters:  05/21/18 (!) 314 lb (142.4 kg)  05/15/18 (!) 314 lb (142.4 kg)  05/09/18 (!) 307 lb 9.6 oz (139.5 kg)      Other studies Reviewed: Echo 05/07/18 Study Conclusions  - Left ventricle: The cavity size was normal. Systolic function was normal. The estimated ejection fraction was in the range of 60% to 65%. Wall motion was normal; there were no regional wall motion abnormalities. Features are consistent with a pseudonormal left ventricular filling pattern, with concomitant abnormal relaxation and increased filling pressure (grade 2 diastolic dysfunction). Doppler parameters are consistent with high ventricular filling pressure. - Aortic valve: Valve area (VTI): 3.19 cm^2. Valve area (Vmax): 2.84 cm^2. Valve area (Vmean): 3.03 cm^2. - Mitral valve: There was mild regurgitation. - Left atrium: The atrium was moderately dilated.  Cardiac Cath 05/08/2018   Prox LAD to Mid LAD lesion is 10% stenosed.  Mid LAD lesion is 20% stenosed.   No significant coronary obstructive disease with mild 10 to 64% mid systolic bridging of the LAD; normal ramus intermediate, left circumflex, and dominant RCA.  LVEDP 14 mm.  Previous echo Doppler study EF 60 to 65%.  RECOMMENDATION: Medical therapy.  Suspect nonischemic etiology to the patient's chest pain.  Recommend weight loss, optimal blood pressure control with target blood pressure less than 130/80.  Lipid-lowering therapy with target LDL less than 70 in this diabetic female.   ASSESSMENT AND PLAN:  1.  Non-obstructive CAD: Cardiac cath as above. She will continue primary prevention with weight loss, statin therapy and BP control. See her again in one year. She is given a letter to return to work on Jun 02, 2018.   2. Morbid Obesity:  She has begum an exercise program and is also on more rigid weight loss diet. She is cleared from a cardiac perspective to participate in aerobic activities.   3. Hypertension: BP is elevated this office visit. She has not yet taken her medications today.   4. OSA: She is compliant with her regimen.   Current medicines are reviewed at length with the patient today.    Labs/ tests ordered today include: None  Phill Myron. West Pugh, ANP, [facility]   05/21/2018 9:42 AM    [facility] [address] Office [phone] Fax [phone]

## 2018-05-21 ENCOUNTER — Ambulatory Visit (INDEPENDENT_AMBULATORY_CARE_PROVIDER_SITE_OTHER): Payer: Medicare Other | Admitting: Adult Health

## 2018-05-21 ENCOUNTER — Encounter: Payer: Self-pay | Admitting: Adult Health

## 2018-05-21 VITALS — BP 144/86 | HR 76 | Ht 64.0 in | Wt 314.0 lb

## 2018-05-21 DIAGNOSIS — I2510 Atherosclerotic heart disease of native coronary artery without angina pectoris: Secondary | ICD-10-CM

## 2018-05-21 DIAGNOSIS — I10 Essential (primary) hypertension: Secondary | ICD-10-CM | POA: Diagnosis not present

## 2018-05-21 NOTE — Patient Instructions (Addendum)
Special Instructions: YOU ARE RELEASED TO RETURN TO WORK ON 06-02-2018-LETTER PROVIDED  Follow-Up: You will need a follow up appointment in [place].   You may see Elouise Munroe, MD or one of the following Advanced Practice Providers on your designated Care Team:  Jory Sims, DNP, AACC   Rosaria Ferries, PA-C  Medication Instructions:  NO CHANGES- Your physician recommends that you continue on your current medications as directed. Please refer to the Current Medication list given to you today. If you need a refill on your cardiac medications before your next appointment, please call your pharmacy.  Labwork: When you have labs (blood work) and your tests are completely normal, you will receive your results ONLY by [place] (if you have MyChart) -OR- A paper copy in the mail.  At [facility], you and your health needs are our priority.  As part of our continuing mission to provide you with exceptional heart care, we have created designated Provider Care Teams.  These Care Teams include your primary Cardiologist (physician) and Advanced Practice Providers (APPs -  Physician Assistants and Nurse Practitioners) who all work together to provide you with the care you need, when you need it.  Thank you for choosing [facility] at [facility]!!

## 2018-05-22 ENCOUNTER — Other Ambulatory Visit: Payer: Self-pay | Admitting: Family Medicine

## 2018-05-22 ENCOUNTER — Telehealth: Payer: Self-pay | Admitting: Family Medicine

## 2018-05-22 DIAGNOSIS — E119 Type 2 diabetes mellitus without complications: Secondary | ICD-10-CM | POA: Diagnosis not present

## 2018-05-22 DIAGNOSIS — M62830 Muscle spasm of back: Secondary | ICD-10-CM | POA: Diagnosis not present

## 2018-05-22 MED ORDER — DIAZEPAM 5 MG PO TABS: ORAL_TABLET | ORAL | 5 refills | Status: DC

## 2018-05-22 MED ORDER — OXYCODONE HCL ER 40 MG PO T12A: EXTENDED_RELEASE_TABLET | ORAL | 0 refills | Status: DC

## 2018-05-22 MED ORDER — ZOLPIDEM TARTRATE 10 MG PO TABS: ORAL_TABLET | ORAL | 5 refills | Status: DC

## 2018-05-22 MED ORDER — OXYCODONE HCL ER 40 MG PO T12A: EXTENDED_RELEASE_TABLET | ORAL | 0 refills | Status: AC

## 2018-05-22 NOTE — Telephone Encounter (Signed)
Please change her appt to the week of April 6, she needs an appt that  [facility] medication management , thank you

## 2018-05-23 LAB — BASIC METABOLIC PANEL WITH GFR
BUN/Creatinine Ratio: 21 (calc) (ref 6–22)
BUN: 24 mg/dL (ref 7–25)
CHLORIDE: 101 mmol/L (ref 98–110)
CO2: 27 mmol/L (ref 20–32)
Calcium: 9.6 mg/dL (ref 8.6–10.4)
Creat: 1.17 mg/dL — ABNORMAL HIGH (ref 0.50–1.05)
GFR, Est African American: 59 mL/min/{1.73_m2} — ABNORMAL LOW (ref >=60)
GFR, Est Non African American: 51 mL/min/{1.73_m2} — ABNORMAL LOW (ref >=60)
Glucose, Bld: 152 mg/dL — ABNORMAL HIGH (ref 65–99)
Potassium: 4.5 mmol/L (ref 3.5–5.3)
Sodium: 137 mmol/L (ref 135–146)

## 2018-05-23 LAB — MAGNESIUM: Magnesium: 1.8 mg/dL (ref 1.5–2.5)

## 2018-05-28 NOTE — Telephone Encounter (Signed)
Made the appt

## 2018-06-01 ENCOUNTER — Other Ambulatory Visit: Payer: Self-pay | Admitting: Family Medicine

## 2018-06-03 ENCOUNTER — Ambulatory Visit: Payer: Self-pay | Admitting: Nutrition

## 2018-06-05 ENCOUNTER — Other Ambulatory Visit: Payer: Self-pay | Admitting: Family Medicine

## 2018-06-10 ENCOUNTER — Other Ambulatory Visit: Payer: Self-pay | Admitting: Family Medicine

## 2018-06-10 DIAGNOSIS — M62830 Muscle spasm of back: Secondary | ICD-10-CM

## 2018-06-16 ENCOUNTER — Other Ambulatory Visit: Payer: Self-pay | Admitting: Family Medicine

## 2018-06-17 ENCOUNTER — Other Ambulatory Visit: Payer: Self-pay

## 2018-06-17 MED ORDER — METOPROLOL TARTRATE 25 MG PO TABS: 12.5000 mg | ORAL_TABLET | Freq: Two times a day (BID) | ORAL | 2 refills | Status: DC

## 2018-06-20 ENCOUNTER — Ambulatory Visit (INDEPENDENT_AMBULATORY_CARE_PROVIDER_SITE_OTHER): Payer: Medicare Other | Admitting: Otolaryngology

## 2018-06-20 DIAGNOSIS — R040 Epistaxis: Secondary | ICD-10-CM

## 2018-06-20 DIAGNOSIS — R51 Headache: Secondary | ICD-10-CM

## 2018-06-24 ENCOUNTER — Other Ambulatory Visit: Payer: Self-pay | Admitting: Family Medicine

## 2018-07-01 ENCOUNTER — Other Ambulatory Visit: Payer: Self-pay | Admitting: Family Medicine

## 2018-07-29 ENCOUNTER — Telehealth: Payer: Self-pay | Admitting: *Deleted

## 2018-07-29 NOTE — Telephone Encounter (Signed)
LEFT MESSAGE TO CALL BACK TO RESCHEDULE  APPT ON 08/05/18

## 2018-07-30 ENCOUNTER — Other Ambulatory Visit: Payer: Self-pay | Admitting: Internal Medicine

## 2018-07-30 MED ORDER — ATORVASTATIN CALCIUM 40 MG PO TABS: 40.0000 mg | ORAL_TABLET | Freq: Every day | ORAL | 4 refills | Status: DC

## 2018-07-30 MED ORDER — METOPROLOL TARTRATE 25 MG PO TABS: 12.5000 mg | ORAL_TABLET | Freq: Two times a day (BID) | ORAL | 4 refills | Status: DC

## 2018-07-30 NOTE — Telephone Encounter (Signed)
° ° ° °*  STAT* If patient is at the pharmacy, call can be transferred to refill team.   1. Which medications need to be refilled? (please list name of each medication and dose if known) metoprolol tartrate (LOPRESSOR) 25 MG tablet, atorvastatin (LIPITOR) 40 MG tablet  2. Which pharmacy/location (including street and city if local pharmacy) is medication to be sent to? [id number] [place] [place]  3. Do they need a 30 day or 90 day supply? [place]

## 2018-07-31 ENCOUNTER — Other Ambulatory Visit: Payer: Self-pay | Admitting: Family Medicine

## 2018-08-01 DIAGNOSIS — L400 Psoriasis vulgaris: Secondary | ICD-10-CM | POA: Diagnosis not present

## 2018-08-01 DIAGNOSIS — L819 Disorder of pigmentation, unspecified: Secondary | ICD-10-CM | POA: Diagnosis not present

## 2018-08-05 ENCOUNTER — Ambulatory Visit: Payer: Medicare Other | Admitting: Internal Medicine

## 2018-08-05 ENCOUNTER — Other Ambulatory Visit: Payer: Self-pay

## 2018-08-05 ENCOUNTER — Ambulatory Visit (INDEPENDENT_AMBULATORY_CARE_PROVIDER_SITE_OTHER): Payer: Medicare Other | Admitting: Family Medicine

## 2018-08-05 ENCOUNTER — Encounter: Payer: Self-pay | Admitting: Family Medicine

## 2018-08-05 VITALS — BP 144/86 | Ht 64.0 in | Wt 314.0 lb

## 2018-08-05 DIAGNOSIS — R112 Nausea with vomiting, unspecified: Secondary | ICD-10-CM | POA: Diagnosis not present

## 2018-08-05 DIAGNOSIS — R52 Pain, unspecified: Secondary | ICD-10-CM | POA: Diagnosis not present

## 2018-08-05 DIAGNOSIS — E119 Type 2 diabetes mellitus without complications: Secondary | ICD-10-CM

## 2018-08-05 DIAGNOSIS — I10 Essential (primary) hypertension: Secondary | ICD-10-CM | POA: Diagnosis not present

## 2018-08-05 DIAGNOSIS — Z1231 Encounter for screening mammogram for malignant neoplasm of breast: Secondary | ICD-10-CM

## 2018-08-05 DIAGNOSIS — E785 Hyperlipidemia, unspecified: Secondary | ICD-10-CM

## 2018-08-05 DIAGNOSIS — G4733 Obstructive sleep apnea (adult) (pediatric): Secondary | ICD-10-CM

## 2018-08-05 DIAGNOSIS — Z6841 Body Mass Index (BMI) 40.0 and over, adult: Secondary | ICD-10-CM

## 2018-08-05 MED ORDER — AMLODIPINE BESYLATE 10 MG PO TABS: 10.0000 mg | ORAL_TABLET | Freq: Every day | ORAL | 3 refills | Status: DC

## 2018-08-05 MED ORDER — ZOLPIDEM TARTRATE 10 MG PO TABS: 10.0000 mg | ORAL_TABLET | Freq: Every day | ORAL | 5 refills | Status: DC

## 2018-08-05 MED ORDER — DIAZEPAM 5 MG PO TABS: ORAL_TABLET | ORAL | 5 refills | Status: AC

## 2018-08-05 MED ORDER — ONDANSETRON HCL 4 MG PO TABS: 4.0000 mg | ORAL_TABLET | Freq: Three times a day (TID) | ORAL | 0 refills | Status: DC | PRN

## 2018-08-05 MED ORDER — LORATADINE 10 MG PO TABS: 10.0000 mg | ORAL_TABLET | Freq: Every day | ORAL | 11 refills | Status: DC

## 2018-08-05 MED ORDER — TRIAMTERENE-HCTZ 75-50 MG PO TABS: 1.0000 | ORAL_TABLET | Freq: Every day | ORAL | 3 refills | Status: DC

## 2018-08-05 MED ORDER — OXYCODONE HCL ER 40 MG PO T12A: 40.0000 mg | EXTENDED_RELEASE_TABLET | Freq: Two times a day (BID) | ORAL | 0 refills | Status: DC

## 2018-08-05 MED ORDER — OXYCODONE HCL ER 40 MG PO T12A: 40.0000 mg | EXTENDED_RELEASE_TABLET | Freq: Two times a day (BID) | ORAL | 0 refills | Status: AC

## 2018-08-05 NOTE — Patient Instructions (Addendum)
F/U in 1 3 weeks, call if you need me sooner  Please get fasting lipid, cmp and EGFr, Magnesium level , hBa1C 1 week before July appt  Mammogram screening will be scheduled for July  Medications will be prescribed as discussed  Zofran is prescribed for your nausea  Drink clear fluids, small amounts frequently, if you nausea, vomiting , or headache gets worse , then t you need to go to the ED   It is important that you exercise regularly at least 30 minutes 5 times a week. If you develop chest pain, have severe difficulty breathing, or feel very tired, stop exercising immediately and seek medical attention  Think about what you will eat, plan ahead. Choose " clean, green, fresh or frozen" over canned, processed or packaged foods which are more sugary, salty and fatty. 70 to 75% of food eaten should be vegetables and fruit. Three meals at set times with snacks allowed between meals, but they must be fruit or vegetables. Aim to eat over a 12 hour period , example 7 am to 7 pm, and STOP after  your last meal of the day. Drink water,generally about 64 ounces per day, no other drink is as healthy. Fruit juice is best enjoyed in a healthy way, by EATING the fruit.   Thanks for choosing [facility], we consider it a privelige to serve you.

## 2018-08-05 NOTE — Assessment & Plan Note (Signed)
DASH diet and commitment to daily physical activity for a minimum of 30 minutes discussed and encouraged, as a part of hypertension management. The importance of attaining a healthy weight is also discussed. Not adequately controlled, wil  Re asses, needs to work on weight loss also  BP/Weight 08/05/2018 05/21/2018 05/15/2018 05/09/2018 04/10/2018 04/03/2018 29/47/6546  Systolic BP 503 546 568 127 517 - 001  Diastolic BP 86 86 70 88 89 - 82  Wt. (Lbs) 314 314 314 307.6 315 312 314.04  BMI 53.9 53.9 53.9 52.8 54.07 53.55 53.9

## 2018-08-05 NOTE — Progress Notes (Signed)
Virtual Visit via Telephone Note  I connected with Angela [name] on 08/05/18 at  2:00 PM EDT by telephone and verified that I am speaking with the correct person using two identifiers.   I discussed the limitations, risks, security and privacy concerns of performing an evaluation and management service by telephone and the availability of in person appointments. I also discussed with the patient that there may be a patient responsible charge related to this service. The patient expressed understanding and agreed to proceed. No visual contact is possible at this time though this is desired, I am at my house . Pt is in her home lying in bed sick since this morning vomiting since this morning, 3 times  Since awakening , mainly liquid, vomited the banana she ate this morning Also c/o headache , x 1 day, denies stiff neck, fever, chills or any new weakness , numbness , vision change or difficulty with speech. Tylenol affords some relief, has had similar headache in the past  History of Present Illness: See HPI Denies recent fever or chills. Denies sinus pressure, nasal congestion, ear pain or sore throat. Denies chest congestion, productive cough or wheezing. Denies chest pains, palpitations and leg swelling    Denies dysuria, frequency, hesitancy or incontinence. Denies uncontrolled  joint pain,and she doies have  limitation in mobility. Denies , seizures,  Denies depression, anxiety or insomnia. Denies skin break down or rash.       Observations/Objective: BP (!) 144/86   Ht 5\' 4"  (1.626 m)   Wt (!) 314 lb (142.4 kg)   BMI 53.90 kg/m    Assessment and Plan: Diabetes mellitus without complication ([place]) Ms. Angela is reminded of the importance of commitment to daily physical activity for 30 minutes or more, as able and the need to limit carbohydrate intake to 30 to 60 grams per meal to help with blood sugar control.   The need to take medication as prescribed, test blood sugar as  directed, and to call between visits if there is a concern that blood sugar is uncontrolled is also discussed.   Ms. [name] is reminded of the importance of daily foot exam, annual eye examination, and good blood sugar, blood pressure and cholesterol control.  Diabetic Labs Latest Ref Rng & Units 05/22/2018 05/09/2018 05/08/2018 05/06/2018 05/05/2018  HbA1c <5.7 % of total Hgb - - - - -  Microalbumin mg/dL - - - - -  Micro/Creat Ratio <30 mcg/mg creat - - - - -  Chol 0 - 200 mg/dL - - - 174 -  HDL >40 mg/dL - - - 49 -  Calc LDL 0 - 99 mg/dL - - - 109(H) -  Triglycerides <150 mg/dL - - - 80 -  Creatinine 0.50 - 1.05 mg/dL 1.17(H) 0.81 0.80 0.85 0.99   BP/Weight 08/05/2018 05/21/2018 05/15/2018 05/09/2018 04/10/2018 04/03/2018 96/28/3662  Systolic BP 947 654 650 354 656 - 812  Diastolic BP 86 86 70 88 89 - 82  Wt. (Lbs) 314 314 314 307.6 315 312 314.04  BMI 53.9 53.9 53.9 52.8 54.07 53.55 53.9   Foot/eye exam completion dates Latest Ref Rng & Units 02/07/2018 11/06/2016  Eye Exam No Retinopathy - -  Foot Form Completion - Done Done    Updated lab needed at/ before next visit.     Encounter for pain management The patient's Controlled Substance registry is reviewed and compliance confirmed. Adequacy of  Pain control and level of function is assessed. Medication dosing is adjusted as deemed  appropriate. Twelve weeks of medication is prescribed , patient signs for the script and is provided with a follow up appointment between 11 to 12 weeks .   Essential hypertension DASH diet and commitment to daily physical activity for a minimum of 30 minutes discussed and encouraged, as a part of hypertension management. The importance of attaining a healthy weight is also discussed. Not adequately controlled, wil  Re asses, needs to work on weight loss also  BP/Weight 08/05/2018 05/21/2018 05/15/2018 05/09/2018 04/10/2018 04/03/2018 19/16/6060  Systolic BP 045 997 741 423 953 - 202  Diastolic BP 86 86 70 88 89 -  82  Wt. (Lbs) 314 314 314 307.6 315 312 314.04  BMI 53.9 53.9 53.9 52.8 54.07 53.55 53.9       Morbid obesity (HCC)   Patient re-educated about  the importance of commitment to a  minimum of 150 minutes of exercise per week as able.  The importance of healthy food choices with portion control discussed, as well as eating regularly and within a 12 hour window most days. The need to choose "clean , green" food 50 to 75% of the time is discussed, as well as to make water the primary drink and set a goal of 64 ounces water daily.  Encouraged to start a food diary,  and to consider  joining a support group. Sample diet sheets offered. Goals set by the patient for the next several months.   Weight /BMI 08/05/2018 05/21/2018 05/15/2018  WEIGHT 314 lb 314 lb 314 lb  HEIGHT 5\' 4"  5\' 4"  5\' 4"   BMI 53.9 kg/m2 53.9 kg/m2 53.9 kg/m2      Sleep apnea syndrome Reports daily use of CPAP and increased dependency  Hyperlipidemia LDL goal <70 Hyperlipidemia:Low fat diet discussed and encouraged. Not currently at goal base don most recent report   Lipid Panel  Lab Results  Component Value Date   CHOL 174 05/06/2018   HDL 49 05/06/2018   LDLCALC 109 (H) 05/06/2018   TRIG 80 05/06/2018   CHOLHDL 3.6 05/06/2018  Updated lab needed at/ before next visit.      Nausea with vomiting 1 day history, thinks that some corn which she ate the day before triggered current symptoms. Zofran prescribed, denies loose stool , fever, chills or severe abdominal pain    Follow Up Instructions:    I discussed the assessment and treatment plan with the patient. The patient was provided an opportunity to ask questions and all were answered. The patient agreed with the plan and demonstrated an understanding of the instructions.   The patient was advised to call back or seek an in-person evaluation if the symptoms worsen or if the condition fails to improve as anticipated.  I provided 30 minutes of  non-face-to-face time during this encounter.   Angela Nakayama, MD

## 2018-08-05 NOTE — Assessment & Plan Note (Signed)
Angela [name] is reminded of the importance of commitment to daily physical activity for 30 minutes or more, as able and the need to limit carbohydrate intake to 30 to 60 grams per meal to help with blood sugar control.   The need to take medication as prescribed, test blood sugar as directed, and to call between visits if there is a concern that blood sugar is uncontrolled is also discussed.   Angela [name] is reminded of the importance of daily foot exam, annual eye examination, and good blood sugar, blood pressure and cholesterol control.  Diabetic Labs Latest Ref Rng & Units 05/22/2018 05/09/2018 05/08/2018 05/06/2018 05/05/2018  HbA1c <5.7 % of total Hgb - - - - -  Microalbumin mg/dL - - - - -  Micro/Creat Ratio <30 mcg/mg creat - - - - -  Chol 0 - 200 mg/dL - - - 174 -  HDL >40 mg/dL - - - 49 -  Calc LDL 0 - 99 mg/dL - - - 109(H) -  Triglycerides <150 mg/dL - - - 80 -  Creatinine 0.50 - 1.05 mg/dL 1.17(H) 0.81 0.80 0.85 0.99   BP/Weight 08/05/2018 05/21/2018 05/15/2018 05/09/2018 04/10/2018 04/03/2018 70/17/7939  Systolic BP 030 092 330 076 226 - 333  Diastolic BP 86 86 70 88 89 - 82  Wt. (Lbs) 314 314 314 307.6 315 312 314.04  BMI 53.9 53.9 53.9 52.8 54.07 53.55 53.9   Foot/eye exam completion dates Latest Ref Rng & Units 02/07/2018 11/06/2016  Eye Exam No Retinopathy - -  Foot Form Completion - Done Done    Updated lab needed at/ before next visit.

## 2018-08-05 NOTE — Assessment & Plan Note (Signed)
   Patient re-educated about  the importance of commitment to a  minimum of 150 minutes of exercise per week as able.  The importance of healthy food choices with portion control discussed, as well as eating regularly and within a 12 hour window most days. The need to choose "clean , green" food 50 to 75% of the time is discussed, as well as to make water the primary drink and set a goal of 64 ounces water daily.  Encouraged to start a food diary,  and to consider  joining a support group. Sample diet sheets offered. Goals set by the patient for the next several months.   Weight /BMI 08/05/2018 05/21/2018 05/15/2018  WEIGHT 314 lb 314 lb 314 lb  HEIGHT 5\' 4"  5\' 4"  5\' 4"   BMI 53.9 kg/m2 53.9 kg/m2 53.9 kg/m2

## 2018-08-05 NOTE — Assessment & Plan Note (Signed)
1 day history, thinks that some corn which she ate the day before triggered current symptoms. Zofran prescribed, denies loose stool , fever, chills or severe abdominal pain

## 2018-08-05 NOTE — Assessment & Plan Note (Signed)
Reports daily use of CPAP and increased dependency

## 2018-08-05 NOTE — Assessment & Plan Note (Signed)
The patient's Controlled Substance registry is reviewed and compliance confirmed. Adequacy of  Pain control and level of function is assessed. Medication dosing is adjusted as deemed appropriate. Twelve weeks of medication is prescribed , patient signs for the script and is provided with a follow up appointment between 11 to 12 weeks .  

## 2018-08-05 NOTE — Assessment & Plan Note (Addendum)
Hyperlipidemia:Low fat diet discussed and encouraged. Not currently at goal base don most recent report   Lipid Panel  Lab Results  Component Value Date   CHOL 174 05/06/2018   HDL 49 05/06/2018   LDLCALC 109 (H) 05/06/2018   TRIG 80 05/06/2018   CHOLHDL 3.6 05/06/2018  Updated lab needed at/ before next visit.

## 2018-08-07 ENCOUNTER — Ambulatory Visit: Payer: Self-pay | Admitting: Internal Medicine

## 2018-08-12 ENCOUNTER — Other Ambulatory Visit: Payer: Self-pay | Admitting: Family Medicine

## 2018-08-12 DIAGNOSIS — M62830 Muscle spasm of back: Secondary | ICD-10-CM

## 2018-08-20 ENCOUNTER — Telehealth: Payer: Self-pay | Admitting: Internal Medicine

## 2018-08-20 NOTE — Telephone Encounter (Signed)
Mychart, no smartphone, pre reg complete 08/20/18 AF

## 2018-08-20 NOTE — Telephone Encounter (Signed)
   TELEPHONE CALL NOTE  This patient has been deemed a candidate for follow-up tele-health visit to limit community exposure during the Covid-19 pandemic. I spoke with the patient via phone to discuss instructions.   The patient will receive a phone call 2-3 days prior to their E-Visit at which time consent will be verbally confirmed.   A Virtual Office Visit appointment type has been scheduled for 4/23 with [facility] patient prefers TELEPHONE ( NO SMART PHONE) type.  I have  confirmed the patient is active in [place], [place], [place] 08/20/2018 1:43 PM

## 2018-08-20 NOTE — Telephone Encounter (Signed)
New Message ° ° °Patient returning your call. °

## 2018-08-20 NOTE — Telephone Encounter (Signed)
Need to schedule televisit 08/22/18 w/ Margaretann Loveless

## 2018-08-22 ENCOUNTER — Telehealth (INDEPENDENT_AMBULATORY_CARE_PROVIDER_SITE_OTHER): Payer: Medicare Other | Admitting: Internal Medicine

## 2018-08-22 ENCOUNTER — Telehealth: Payer: Self-pay | Admitting: Internal Medicine

## 2018-08-22 ENCOUNTER — Encounter: Payer: Self-pay | Admitting: Internal Medicine

## 2018-08-22 ENCOUNTER — Telehealth: Payer: Self-pay | Admitting: *Deleted

## 2018-08-22 ENCOUNTER — Other Ambulatory Visit: Payer: Self-pay

## 2018-08-22 VITALS — BP 153/101 | HR 84 | Ht 64.0 in

## 2018-08-22 DIAGNOSIS — I10 Essential (primary) hypertension: Secondary | ICD-10-CM

## 2018-08-22 DIAGNOSIS — E1159 Type 2 diabetes mellitus with other circulatory complications: Secondary | ICD-10-CM

## 2018-08-22 MED ORDER — HYDRALAZINE HCL 25 MG PO TABS: 25.0000 mg | ORAL_TABLET | Freq: Two times a day (BID) | ORAL | 3 refills | Status: DC

## 2018-08-22 MED ORDER — CARVEDILOL 12.5 MG PO TABS: 12.5000 mg | ORAL_TABLET | Freq: Two times a day (BID) | ORAL | 3 refills | Status: DC

## 2018-08-22 NOTE — Telephone Encounter (Signed)
Called patient to go over  Instructions- no answer - unable to leave message - voicemail not set up  will try again tomorrow

## 2018-08-22 NOTE — Patient Instructions (Addendum)
Medication Instructions:   START carvediolol 12.5 mg BID  STOP metoprolol 12.5 mg BID after receiving carvedilol.   If BP >180mg , Take hydralazine 25 mg BID.   If you need a refill on your cardiac medications before your next appointment, please call your pharmacy.   Lab work: Not needed   Testing/Procedures: Not needed  Follow-Up: At [facility], you and your health needs are our priority.  As part of our continuing mission to provide you with exceptional heart care, we have created designated Provider Care Teams.  These Care Teams include your primary Cardiologist (physician) and Advanced Practice Providers (APPs -  Physician Assistants and Nurse Practitioners) who all work together to provide you with the care you need, when you need it. You will need a follow up appointment in 6 weeks.  Please call our office 2 months in advance to schedule this appointment.  You may see Elouise Munroe, MD or one of the following Advanced Practice Providers on your designated Care Team:   Rosaria Ferries, PA-C . Jory Sims, DNP, ANP  Any Other Special Instructions Will Be Listed Below .  check BP every day for 1-2 weeks and call or send mychart message with readings.

## 2018-08-22 NOTE — Telephone Encounter (Signed)
New message   Please call the home phone to contact the patient for the virtual visit today. Patient is waiting for the call.

## 2018-08-22 NOTE — Progress Notes (Signed)
Virtual Visit via Telephone Note   This visit type was conducted due to national recommendations for restrictions regarding the COVID-19 Pandemic (e.g. social distancing) in an effort to limit this patient's exposure and mitigate transmission in our community.  Due to her co-morbid illnesses, this patient is at least at moderate risk for complications without adequate follow up.  This format is felt to be most appropriate for this patient at this time.  The patient did not have access to video technology/had technical difficulties with video requiring transitioning to audio format only (telephone).  All issues noted in this document were discussed and addressed.  No physical exam could be performed with this format.  Please refer to the patient's chart for her  consent to telehealth for [facility].   Evaluation Performed:  Follow-up visit  Date:  08/23/2018   ID:  Angela [name], DOB 19-Feb-1961, MRN [id number]  Patient Location: Home Provider Location: Home  PCP:  Fayrene Helper, MD  Cardiologist:  Elouise Munroe, MD  Electrophysiologist:  None   Chief Complaint:  3 mo follow up, HTN  History of Present Illness:    Angela [name] is a 58 y.o. female with a hx of HTN, Type II diabetes, and hyperlipidemia. Our initial encounter in January was for unstable angina in hospital, and cardiac cath on 05/08/2018 revealed proximal and mid LAD lesion of 10%, mild LAD of 20%.  Normal LVEF. She was recommended for medical management with BP control and lipid lowering therapy with target LDL < 70.   She is quite concerned about her elevated BP today. BP 190/108 mmHg, pulse 99 bpm. Feels poorly with elevated rates. We reviewed medications in great detail, including metoprolol 12.5 BI, crestor 40 mg daily, amlodipine 10 mg daily. Still experiencing high blood pressures and she is very concerned.   The patient denies chest pain, chest pressure, dyspnea at rest or with exertion, palpitations, PND,  orthopnea, or leg swelling. Denies syncope or presyncope. Denies dizziness or lightheadedness.   The patient does not have symptoms concerning for COVID-19 infection (fever, chills, cough, or new shortness of breath).    Past Medical History:  Diagnosis Date  . Anxiety   . Arthritis   . Asthma   . Coronary atherosclerosis of native coronary artery    Mild nonobstructive CAD by cardiac catheterization 2008 - Dr. Terrence Dupont  . Degenerative disc disease   . Depression   . Dysrhythmia   . Essential hypertension, benign   . Folliculitis 2/83/1517  . GERD (gastroesophageal reflux disease)   . H/O hiatal hernia   . Helicobacter pylori gastritis 2008  . Hypercholesterolemia   . Hypertension   . Hyperthyroidism    s/p radiation  . Low back pain   . Migraine   . Nephrolithiasis    Recurring episodes since 2004  . Severe obesity (BMI >= 40) (HCC)   . Sleep apnea    STOP BANG SCORE 6no cpap used  . Type 2 diabetes mellitus with diabetic neuropathy [facility])    Past Surgical History:  Procedure Laterality Date  . ABDOMINAL HYSTERECTOMY    . BACK SURGERY  1993  . BACK SURGERY  1994  . BACK SURGERY  2002  . BACK SURGERY  2011   [place] Left   . CHOLECYSTECTOMY  1996  . COLONOSCOPY  2000 [facility]   INT HEMORRHOIDS/FISSURE  . COLONOSCOPY  2003 [facility], CHANGE IN BOWEL HABITS   INT HEMORRHOIDS  . COLONOSCOPY  2006 [facility]   INT HEMORRHOIDS  . COLONOSCOPY  2007 [facility]   INT HEMORRHOIDS  . CYSTOSCOPY WITH STENT PLACEMENT Left 07/25/2012   Procedure: CYSTOSCOPY, left retograde pyelogram WITH left ureteral  STENT PLACEMENT;  Surgeon: Claybon Jabs, MD;  Location: [facility];  Service: Urology;  Laterality: Left;  . CYSTOSCOPY/RETROGRADE/URETEROSCOPY  12/04/2011   Procedure: CYSTOSCOPY/RETROGRADE/URETEROSCOPY;  Surgeon: Molli Hazard, MD;  Location: [facility];  Service: Urology;  Laterality: Right;  . CYSTOSCOPY/RETROGRADE/URETEROSCOPY/STONE EXTRACTION WITH BASKET Left  09/03/2012   Procedure: CYSTOSCOPY/RETROGRADE pyelogram/digital URETEROSCOPY/STONE EXTRACTION WITH BASKET, left stent removal;  Surgeon: Molli Hazard, MD;  Location: [facility];  Service: Urology;  Laterality: Left;  . ESOPHAGOGASTRODUODENOSCOPY  2008   [facility]:RDEYC hiatal hernia./Normal esophagus without evidence of Barrett's mass, stricture, erosion or ulceration./Normal duodenal bulb and second portion of the duodenum./Diffuse erythema in the body and the antrum with occasional erosion.  Biopsies obtained via cold forceps to evaluate for H. pylori gastritis  . FRACTURE SURGERY  1999   right clavicle  . HOLMIUM LASER APPLICATION Left 05/04/4816   Procedure: HOLMIUM LASER APPLICATION;  Surgeon: Molli Hazard, MD;  Location: [facility];  Service: Urology;  Laterality: Left;  . KNEE ARTHROSCOPY  10/04/2010,    right knee arthroscopy, dr Theda Sers  . LEFT HEART CATH AND CORONARY ANGIOGRAPHY N/A 05/08/2018   Procedure: LEFT HEART CATH AND CORONARY ANGIOGRAPHY;  Surgeon: Troy Sine, MD;  Location: [place] CV [facility];  Service: Cardiovascular;  Laterality: N/A;  . left knee arthroscopic surgery  1999  . Left salphingectomy secondary to ectopic pregnancy  1991  . PARTIAL HYSTERECTOMY  1991  . rotary cuff  Right 05/14/2014   [facility]  . UPPER GASTROINTESTINAL ENDOSCOPY  2008 abd pain   H. pylori gastritis  . UPPER GASTROINTESTINAL ENDOSCOPY  1996 [place]   PUD     Current Meds  Medication Sig  . albuterol (PROAIR HFA) 108 (90 Base) MCG/ACT inhaler INHALE 2 PUFFS BY MOUTH EVERY 6-8HRS AS NEEDED (Patient taking differently: Inhale 2 puffs into the lungs every 6 (six) hours as needed for wheezing or shortness of breath. )  . amLODipine (NORVASC) 10 MG tablet Take 1 tablet (10 mg total) by mouth daily.  . cloNIDine (CATAPRES - DOSED IN MG/24 HR) 0.3 mg/24hr patch Place 1 patch (0.3 mg total) onto the skin once a week.  . cloNIDine (CATAPRES) 0.3 MG tablet Take 1 tablet (0.3 mg total) by mouth  at bedtime.  . cyclobenzaprine (FLEXERIL) 10 MG tablet TAKE 1 TABLET BY MOUTH THREE TIMES A DAY  . diazepam (VALIUM) 5 MG tablet Take one tablet by mouth three times daily for muscle spasm  . DULoxetine (CYMBALTA) 60 MG capsule TAKE 1 CAPSULE BY MOUTH TWICE A DAY (Patient taking differently: Take 60 mg by mouth 2 (two) times daily. )  . ergocalciferol (VITAMIN D2) 50000 units capsule Take 1 capsule (50,000 Units total) by mouth once a week. One capsule once weekly  . esomeprazole (NEXIUM) 40 MG capsule Take 1 capsule (40 mg total) by mouth daily.  . fluticasone (FLONASE) 50 MCG/ACT nasal spray Place 2 sprays into both nostrils daily. (Patient taking differently: Place 2 sprays into both nostrils daily as needed for allergies. )  . gabapentin (NEURONTIN) 800 MG tablet TAKE 1 TABLET BY MOUTH 4 TIMES A DAY  . hydrOXYzine (ATARAX/VISTARIL) 10 MG tablet Take 10-30 mg by mouth at bedtime.   Marland Kitchen ipratropium-albuterol (DUONEB) 0.5-2.5 (3) MG/3ML SOLN Take 3 mLs by nebulization  every 6 (six) hours as needed. (Patient taking differently: Take 3 mLs by nebulization every 6 (six) hours as needed (SOB). )  . KLOR-CON M20 20 MEQ tablet TAKE 1 TABLET (20 MEQ TOTAL) BY MOUTH 2 (TWO) TIMES DAILY.  Marland Kitchen loratadine (CLARITIN) 10 MG tablet Take 1 tablet (10 mg total) by mouth daily.  . metFORMIN (GLUCOPHAGE) 1000 MG tablet Take 1 tablet (1,000 mg total) by mouth 2 (two) times daily.  . montelukast (SINGULAIR) 10 MG tablet TAKE 1 TABLET (10 MG TOTAL) BY MOUTH AT BEDTIME.  . nitroGLYCERIN (NITROSTAT) 0.4 MG SL tablet Place 1 tablet (0.4 mg total) under the tongue every 5 (five) minutes as needed for chest pain.  Marland Kitchen ondansetron (ZOFRAN) 4 MG tablet Take 1 tablet (4 mg total) by mouth every 8 (eight) hours as needed for nausea or vomiting.  Derrill Memo ON 10/15/2018] oxyCODONE (OXYCONTIN) 40 mg 12 hr tablet Take 1 tablet (40 mg total) by mouth every 12 (twelve) hours for 30 days.  . SYMBICORT 160-4.5 MCG/ACT inhaler TAKE 2 PUFFS BY  MOUTH TWICE A DAY  . triamterene-hydrochlorothiazide (MAXZIDE) 75-50 MG tablet Take 1 tablet by mouth daily.  Marland Kitchen zolpidem (AMBIEN) 10 MG tablet Take 1 tablet (10 mg total) by mouth at bedtime.  . [DISCONTINUED] metoprolol tartrate (LOPRESSOR) 25 MG tablet Take 0.5 tablets (12.5 mg total) by mouth 2 (two) times daily.     Allergies:   Morphine; Ace inhibitors; Aspirin; Losartan; Tapentadol; Tomato; and Adhesive [tape]   Social History   Tobacco Use  . Smoking status: Never Smoker  . Smokeless tobacco: Never Used  Substance Use Topics  . Alcohol use: No  . Drug use: No     Family Hx: The patient's family history includes Asthma in her mother; Cancer in her father and mother; Colon cancer in her father; Heart attack in her father; Heart disease in her mother; Hypertension in her mother. There is no history of Colon polyps.  ROS:   Please see the history of present illness.     All other systems reviewed and are negative.   Prior CV studies:   The following studies were reviewed today:  No new  Labs/Other Tests and Data Reviewed:    EKG:  No ECG reviewed.  Recent Labs: 05/05/2018: ALT 24 05/06/2018: TSH 6.946 05/09/2018: Hemoglobin 12.3; Platelets 381 05/22/2018: BUN 24; Creat 1.17; Magnesium 1.8; Potassium 4.5; Sodium 137   Recent Lipid Panel Lab Results  Component Value Date/Time   CHOL 174 05/06/2018 03:35 AM   TRIG 80 05/06/2018 03:35 AM   HDL 49 05/06/2018 03:35 AM   CHOLHDL 3.6 05/06/2018 03:35 AM   LDLCALC 109 (H) 05/06/2018 03:35 AM   LDLCALC 54 02/06/2018 01:09 PM    Wt Readings from Last 3 Encounters:  08/05/18 (!) 314 lb (142.4 kg)  05/21/18 (!) 314 lb (142.4 kg)  05/15/18 (!) 314 lb (142.4 kg)     Objective:    Vital Signs:  BP (!) 153/101   Pulse 84   Ht 5\' 4"  (1.626 m)   BMI 53.90 kg/m    VITAL SIGNS:  reviewed GEN:  no acute distress  ASSESSMENT & PLAN:    1. Essential hypertension   2. Morbid obesity ([place])   3. Coronary artery disease  involving native coronary artery of native heart without angina pectoris     HTN - Will transition metoprolol to carvedilol for better BP control  If BP > 180, will add hydralazine 25 mg BID.   Continue  other HTN meds.   F/u in 6 weeks. She should send [place] a log of 1-2 weeks of BP readings to ensure we are making progress.  COVID-19 Education: The signs and symptoms of COVID-19 were discussed with the patient and how to seek care for testing (follow up with PCP or arrange E-visit).  The importance of social distancing was discussed today.  Time:   Today, I have spent 25 minutes with the patient with telehealth technology discussing the above problems.     Medication Adjustments/Labs and Tests Ordered: Current medicines are reviewed at length with the patient today.  Concerns regarding medicines are outlined above.   Tests Ordered: No orders of the defined types were placed in this encounter.   Medication Changes: Meds ordered this encounter  Medications  . hydrALAZINE (APRESOLINE) 25 MG tablet    Sig: Take 1 tablet (25 mg total) by mouth 2 (two) times a day.    Dispense:  180 tablet    Refill:  3  . carvedilol (COREG) 12.5 MG tablet    Sig: Take 1 tablet (12.5 mg total) by mouth 2 (two) times daily.    Dispense:  180 tablet    Refill:  3    Disposition:  Follow up in 6 week(s)  Signed, Elouise Munroe, MD  08/23/2018 11:42 AM    [facility]  Medication Instructions:   START carvediolol 12.5 mg BID  STOP metoprolol 12.5 mg BID after receiving carvedilol.   If BP >180mg , Take hydralazine 25 mg BID.   If you need a refill on your cardiac medications before your next appointment, please call your pharmacy.   Lab work: Not needed   Testing/Procedures: Not needed  Follow-Up: At [facility], you and your health needs are our priority.  As part of our continuing mission to provide you with exceptional heart care, we have created  designated Provider Care Teams.  These Care Teams include your primary Cardiologist (physician) and Advanced Practice Providers (APPs -  Physician Assistants and Nurse Practitioners) who all work together to provide you with the care you need, when you need it. You will need a follow up appointment in 6 weeks.  Please call our office 2 months in advance to schedule this appointment.  You may see Elouise Munroe, MD or one of the following Advanced Practice Providers on your designated Care Team:   Rosaria Ferries, PA-C . Jory Sims, DNP, ANP  Any Other Special Instructions Will Be Listed Below .  check BP every day for 1-2 weeks and call or send mychart message with readings.

## 2018-08-22 NOTE — Telephone Encounter (Signed)
spoke to patient . appointment completed

## 2018-08-22 NOTE — Telephone Encounter (Signed)
Pt has called several time today for her telephone appointment today and still has not had the appointment.   Pt needs a call back.  Complaining still has not had a call to her home number appt was at 1:20pm  Pt asked for the office managers information given.

## 2018-08-23 ENCOUNTER — Telehealth: Payer: Self-pay

## 2018-08-23 ENCOUNTER — Telehealth: Payer: Self-pay | Admitting: Family Medicine

## 2018-08-23 NOTE — Telephone Encounter (Signed)
Ok. We will add her to your schedule for Monday.

## 2018-08-23 NOTE — Telephone Encounter (Signed)
[place] also wanted you to know her BP is 174/102 today and it was 153/101 yesterday as well as the frequent urination.

## 2018-08-23 NOTE — Telephone Encounter (Signed)
Yes please  Go ahead

## 2018-08-23 NOTE — Telephone Encounter (Signed)
Patient called stating that she is urinating a lot. When she goes to use the bathroom it is always a lot. No pain, no burning. She is just tired of urinating. She doesn't think it is a kidney stone. She is just tired of going to the bathroom. We can put her on for a telephone or webex for Monday. What would you like for [place] to do?

## 2018-08-23 NOTE — Telephone Encounter (Signed)
Noted thanks °

## 2018-08-26 ENCOUNTER — Other Ambulatory Visit: Payer: Self-pay

## 2018-08-26 ENCOUNTER — Encounter: Payer: Self-pay | Admitting: Family Medicine

## 2018-08-26 ENCOUNTER — Ambulatory Visit (INDEPENDENT_AMBULATORY_CARE_PROVIDER_SITE_OTHER): Payer: Medicare Other | Admitting: Family Medicine

## 2018-08-26 ENCOUNTER — Ambulatory Visit: Payer: Self-pay | Admitting: Family Medicine

## 2018-08-26 ENCOUNTER — Ambulatory Visit (INDEPENDENT_AMBULATORY_CARE_PROVIDER_SITE_OTHER): Payer: Medicare Other

## 2018-08-26 VITALS — BP 153/99 | Ht 64.0 in | Wt 314.0 lb

## 2018-08-26 DIAGNOSIS — N3000 Acute cystitis without hematuria: Secondary | ICD-10-CM | POA: Diagnosis not present

## 2018-08-26 DIAGNOSIS — E785 Hyperlipidemia, unspecified: Secondary | ICD-10-CM | POA: Diagnosis not present

## 2018-08-26 DIAGNOSIS — R3589 Other polyuria: Secondary | ICD-10-CM

## 2018-08-26 DIAGNOSIS — R358 Other polyuria: Secondary | ICD-10-CM

## 2018-08-26 DIAGNOSIS — I10 Essential (primary) hypertension: Secondary | ICD-10-CM

## 2018-08-26 DIAGNOSIS — E1159 Type 2 diabetes mellitus with other circulatory complications: Secondary | ICD-10-CM

## 2018-08-26 LAB — GLUCOSE, POCT (MANUAL RESULT ENTRY): POC Glucose: 367 mg/dl — AB (ref 70–99)

## 2018-08-26 MED ORDER — BLOOD GLUCOSE METER KIT: PACK | 0 refills | Status: AC

## 2018-08-26 MED ORDER — INSULIN ASPART 100 UNIT/ML ~~LOC~~ SOLN
7.0000 [IU] | Freq: Once | SUBCUTANEOUS | Status: AC
  Administered 2018-08-26: 10:00:00 7 [IU] via SUBCUTANEOUS

## 2018-08-26 NOTE — Assessment & Plan Note (Signed)
1 week h/o symptoms suggestibve of uncontrolled diabetes and pt reports has no testing supplies, labs today, and fingerstick at office

## 2018-08-26 NOTE — Assessment & Plan Note (Signed)
Updated lab needed at/ before next visit. Symptoms suggest currently uncontrolled

## 2018-08-26 NOTE — Progress Notes (Signed)
Virtual Visit via Telephone Note  I connected with Angela [name] on 08/26/18 at  8:20 AM EDT by telephone and verified that I am speaking with the correct person using two identifiers.   I discussed the limitations, risks, security and privacy concerns of performing an evaluation and management service by telephone and the availability of in person appointments. I also discussed with the patient that there may be a patient responsible charge related to this service. The patient expressed understanding and agreed to proceed. I am in the office and the patient is in her home, unable to make web ex contact   History of Present Illness: 1 week h/o excess thirst , polyuria, dry mouth, fatigue , no vision disturbance noted No dysuria, no fever or chills Review of Systems  Constitutional: Positive for malaise/fatigue. Negative for chills and fever.  HENT: Negative for congestion and sinus pain.   Respiratory: Positive for cough. Negative for sputum production and wheezing.   Cardiovascular: Negative for chest pain, palpitations and leg swelling.  Genitourinary: Positive for frequency and urgency. Negative for dysuria and hematuria.  Skin: Negative for rash.  Neurological: Positive for weakness.  Endo/Heme/Allergies: Positive for polydipsia.  Psychiatric/Behavioral: Positive for depression.      Observations/Objective: BP (!) 153/99   Wt (!) 314 lb (142.4 kg)   BMI 53.90 kg/m    Assessment and Plan:  Polyuria 1 week h/o symptoms suggestibve of uncontrolled diabetes and pt reports has no testing supplies, labs today, and fingerstick at office  Type 2 diabetes mellitus with vascular disease ([place]) Updated lab needed at/ before next visit. Symptoms suggest currently uncontrolled  Hyperlipidemia LDL goal <70 Hyperlipidemia:Low fat diet discussed and encouraged.   Lipid Panel  Lab Results  Component Value Date   CHOL 174 05/06/2018   HDL 49 05/06/2018   LDLCALC 109 (H) 05/06/2018    TRIG 80 05/06/2018   CHOLHDL 3.6 05/06/2018     Uncontrolled , updated lab today  Essential hypertension Uncontrolled,new management by cardiology DASH diet and commitment to daily physical activity for a minimum of 30 minutes discussed and encouraged, as a part of hypertension management. The importance of attaining a healthy weight is also discussed.  BP/Weight 08/26/2018 08/22/2018 08/05/2018 05/21/2018 05/15/2018 05/09/2018 83/12/4074  Systolic BP 808 811 031 594 585 929 244  Diastolic BP 99 628 86 86 70 88 89  Wt. (Lbs) 314 - 314 314 314 307.6 315  BMI 53.9 53.9 53.9 53.9 53.9 52.8 54.07       Morbid obesity (HCC)   Patient re-educated about  the importance of commitment to a  minimum of 150 minutes of exercise per week as able.  The importance of healthy food choices with portion control discussed, as well as eating regularly and within a 12 hour window most days. The need to choose "clean , green" food 50 to 75% of the time is discussed, as well as to make water the primary drink and set a goal of 64 ounces water daily.    Weight /BMI 08/26/2018 08/22/2018 08/05/2018  WEIGHT 314 lb - 314 lb  HEIGHT - 5\' 4"  5\' 4"   BMI 53.9 kg/m2 53.9 kg/m2 53.9 kg/m2       Follow Up Instructions:    I discussed the assessment and treatment plan with the patient. The patient was provided an opportunity to ask questions and all were answered. The patient agreed with the plan and demonstrated an understanding of the instructions.   The patient was advised to call  back or seek an in-person evaluation if the symptoms worsen or if the condition fails to improve as anticipated.  I provided 22 minutes of non-face-to-face time during this encounter.   [name], MD

## 2018-08-26 NOTE — Progress Notes (Signed)
Blood sugar 367. Verbal order by Dr Moshe Cipro to give 7 units of novolog. Given with no problems

## 2018-08-26 NOTE — Patient Instructions (Signed)
F/u as before , call if you need me sooner  Please get labs this MORNING at the lab next door as we discussed , I believe that your symptoms are dus to uncontrolled diabetes  Please stop by the office after your lab to get fingerstick to check your blood sugar this morning  Please submit Urine at the lab this morning for [place] and reflex culture  Nurse will order testing supplies and meter as you report having one  Thanks for choosing [facility], we consider it a privelige to serve you.

## 2018-08-26 NOTE — Assessment & Plan Note (Signed)
Hyperlipidemia:Low fat diet discussed and encouraged.   Lipid Panel  Lab Results  Component Value Date   CHOL 174 05/06/2018   HDL 49 05/06/2018   LDLCALC 109 (H) 05/06/2018   TRIG 80 05/06/2018   CHOLHDL 3.6 05/06/2018     Uncontrolled , updated lab today

## 2018-08-26 NOTE — Assessment & Plan Note (Signed)
   Patient re-educated about  the importance of commitment to a  minimum of 150 minutes of exercise per week as able.  The importance of healthy food choices with portion control discussed, as well as eating regularly and within a 12 hour window most days. The need to choose "clean , green" food 50 to 75% of the time is discussed, as well as to make water the primary drink and set a goal of 64 ounces water daily.    Weight /BMI 08/26/2018 08/22/2018 08/05/2018  WEIGHT 314 lb - 314 lb  HEIGHT - 5\' 4"  5\' 4"   BMI 53.9 kg/m2 53.9 kg/m2 53.9 kg/m2

## 2018-08-26 NOTE — Assessment & Plan Note (Signed)
Uncontrolled,new management by cardiology DASH diet and commitment to daily physical activity for a minimum of 30 minutes discussed and encouraged, as a part of hypertension management. The importance of attaining a healthy weight is also discussed.  BP/Weight 08/26/2018 08/22/2018 08/05/2018 05/21/2018 05/15/2018 05/09/2018 43/83/8184  Systolic BP 037 543 606 770 340 352 481  Diastolic BP 99 859 86 86 70 88 89  Wt. (Lbs) 314 - 314 314 314 307.6 315  BMI 53.9 53.9 53.9 53.9 53.9 52.8 54.07

## 2018-08-27 LAB — URINALYSIS W MICROSCOPIC + REFLEX CULTURE
Bacteria, UA: NONE SEEN /HPF
Bilirubin Urine: NEGATIVE
Hyaline Cast: NONE SEEN /LPF
Leukocyte Esterase: NEGATIVE
Nitrites, Initial: NEGATIVE
Specific Gravity, Urine: 1.034 (ref 1.001–1.03)
pH: <=5 (ref 5.0–8.0)

## 2018-08-27 LAB — NO CULTURE INDICATED

## 2018-08-28 DIAGNOSIS — E785 Hyperlipidemia, unspecified: Secondary | ICD-10-CM | POA: Diagnosis not present

## 2018-08-28 DIAGNOSIS — E119 Type 2 diabetes mellitus without complications: Secondary | ICD-10-CM | POA: Diagnosis not present

## 2018-08-29 ENCOUNTER — Telehealth: Payer: Self-pay

## 2018-08-29 ENCOUNTER — Other Ambulatory Visit: Payer: Self-pay | Admitting: Family Medicine

## 2018-08-29 LAB — COMPLETE METABOLIC PANEL WITH GFR
AG Ratio: 1.4 (calc) (ref 1.0–2.5)
ALT: 51 U/L — AB (ref 6–29)
AST: 37 U/L — ABNORMAL HIGH (ref 10–35)
Albumin: 4.4 g/dL (ref 3.6–5.1)
Alkaline phosphatase (APISO): 98 U/L (ref 37–153)
BUN: 17 mg/dL (ref 7–25)
CO2: 29 mmol/L (ref 20–32)
Calcium: 9.6 mg/dL (ref 8.6–10.4)
Chloride: 98 mmol/L (ref 98–110)
Creat: 0.9 mg/dL (ref 0.50–1.05)
GFR, Est African American: 82 mL/min/{1.73_m2} (ref >=60)
GFR, Est Non African American: 70 mL/min/{1.73_m2} (ref >=60)
Globulin: 3.2 g/dL (calc) (ref 1.9–3.7)
Glucose, Bld: 393 mg/dL — ABNORMAL HIGH (ref 65–99)
Potassium: 4.6 mmol/L (ref 3.5–5.3)
Sodium: 137 mmol/L (ref 135–146)
Total Bilirubin: 0.5 mg/dL (ref 0.2–1.2)
Total Protein: 7.6 g/dL (ref 6.1–8.1)

## 2018-08-29 LAB — LIPID PANEL
Cholesterol: 90 mg/dL (ref <200)
HDL: 31 mg/dL — ABNORMAL LOW (ref >=50)
LDL Cholesterol (Calc): 33 mg/dL (calc)
Non-HDL Cholesterol (Calc): 59 mg/dL (calc) (ref <130)
Total CHOL/HDL Ratio: 2.9 (calc) (ref <5.0)
Triglycerides: 179 mg/dL — ABNORMAL HIGH (ref <150)

## 2018-08-29 LAB — HEMOGLOBIN A1C: Hgb A1c MFr Bld: >14 % of total Hgb — ABNORMAL HIGH (ref <5.7)

## 2018-08-29 MED ORDER — GLIPIZIDE ER 10 MG PO TB24: 10.0000 mg | ORAL_TABLET | Freq: Every day | ORAL | 5 refills | Status: DC

## 2018-08-29 MED ORDER — GLUCOSE BLOOD VI STRP: ORAL_STRIP | 5 refills | Status: DC

## 2018-08-29 NOTE — Telephone Encounter (Signed)
-----   Message from Fayrene Helper, MD sent at 08/29/2018  7:56 AM EDT ----- Needs to add glipizide ER 10 mg two tablets every morning to her metformin, may need insulin, increase testing to 3 times daily, pls send in order Goal for fasting blood sugar ranges from 80 to 120 and 2 hours after any meal or at bedtime should be between 130 to 170. Bring meter and log to visit next week and all medication.  Refer to diabetic ed

## 2018-08-29 NOTE — Progress Notes (Signed)
Glipizide eR

## 2018-09-02 ENCOUNTER — Other Ambulatory Visit: Payer: Self-pay | Admitting: Family Medicine

## 2018-09-04 ENCOUNTER — Ambulatory Visit (INDEPENDENT_AMBULATORY_CARE_PROVIDER_SITE_OTHER): Payer: Medicare Other | Admitting: Family Medicine

## 2018-09-04 ENCOUNTER — Encounter: Payer: Self-pay | Admitting: Family Medicine

## 2018-09-04 ENCOUNTER — Other Ambulatory Visit: Payer: Self-pay

## 2018-09-04 VITALS — BP 128/84 | HR 75 | Resp 15 | Ht 64.0 in | Wt 309.4 lb

## 2018-09-04 DIAGNOSIS — E782 Mixed hyperlipidemia: Secondary | ICD-10-CM | POA: Diagnosis not present

## 2018-09-04 DIAGNOSIS — E1165 Type 2 diabetes mellitus with hyperglycemia: Secondary | ICD-10-CM

## 2018-09-04 DIAGNOSIS — E1159 Type 2 diabetes mellitus with other circulatory complications: Secondary | ICD-10-CM | POA: Diagnosis not present

## 2018-09-04 DIAGNOSIS — Z794 Long term (current) use of insulin: Secondary | ICD-10-CM

## 2018-09-04 DIAGNOSIS — I10 Essential (primary) hypertension: Secondary | ICD-10-CM | POA: Diagnosis not present

## 2018-09-04 DIAGNOSIS — IMO0001 Reserved for inherently not codable concepts without codable children: Secondary | ICD-10-CM

## 2018-09-04 LAB — GLUCOSE, POCT (MANUAL RESULT ENTRY): POC Glucose: 331 mg/dl — AB (ref 70–99)

## 2018-09-04 MED ORDER — INSULIN ASPART 100 UNIT/ML ~~LOC~~ SOLN
7.0000 [IU] | Freq: Once | SUBCUTANEOUS | Status: AC
  Administered 2018-09-04: 7 [IU] via SUBCUTANEOUS

## 2018-09-04 NOTE — Patient Instructions (Signed)
F/u as before, call if you need me sooner  You are referred urgently to Diabetes Specialist , Dr. Dorris Fetch, appt will be provided today   Your blood pressure is EXCELLENT, stay on those medications  We have given you 7 units of regular insulin in the office today, since your sugar is dso high.  Please have breakfast when you go home   I know that you will do VERY WELL, and that you will get your blood sugar extremely well controlled  Thanks for choosing [facility], we consider it a privelige to serve you.

## 2018-09-05 ENCOUNTER — Ambulatory Visit (INDEPENDENT_AMBULATORY_CARE_PROVIDER_SITE_OTHER): Payer: Medicare Other | Admitting: "Endocrinology

## 2018-09-05 ENCOUNTER — Encounter: Payer: Self-pay | Admitting: "Endocrinology

## 2018-09-05 ENCOUNTER — Other Ambulatory Visit: Payer: Self-pay

## 2018-09-05 VITALS — BP 145/90 | HR 68 | Ht 64.0 in | Wt 311.0 lb

## 2018-09-05 DIAGNOSIS — E1165 Type 2 diabetes mellitus with hyperglycemia: Secondary | ICD-10-CM | POA: Diagnosis not present

## 2018-09-05 DIAGNOSIS — I10 Essential (primary) hypertension: Secondary | ICD-10-CM | POA: Diagnosis not present

## 2018-09-05 DIAGNOSIS — E039 Hypothyroidism, unspecified: Secondary | ICD-10-CM

## 2018-09-05 DIAGNOSIS — E782 Mixed hyperlipidemia: Secondary | ICD-10-CM

## 2018-09-05 MED ORDER — INSULIN PEN NEEDLE 31G X 8 MM MISC: 1.0000 | 3 refills | Status: DC

## 2018-09-05 MED ORDER — INSULIN GLARGINE 100 UNIT/ML SOLOSTAR PEN: 30.0000 [IU] | PEN_INJECTOR | Freq: Every day | SUBCUTANEOUS | 2 refills | Status: DC

## 2018-09-05 MED ORDER — LEVOTHYROXINE SODIUM 50 MCG PO TABS: 50.0000 ug | ORAL_TABLET | Freq: Every day | ORAL | 2 refills | Status: DC

## 2018-09-05 NOTE — Progress Notes (Signed)
Endocrinology Consult Note       09/05/2018, 12:32 PM   Subjective:    [name], female    DOB: [birth year].  [place] is being seen in consultation for management of currently uncontrolled symptomatic diabetes requested by  Fayrene Helper, MD.   Past Medical History:  Diagnosis Date  . Anxiety   . Arthritis   . Asthma   . Coronary atherosclerosis of native coronary artery    Mild nonobstructive CAD by cardiac catheterization 2008 - Dr. Terrence Dupont  . Degenerative disc disease   . Depression   . Dysrhythmia   . Essential hypertension, benign   . Folliculitis 6/96/2952  . GERD (gastroesophageal reflux disease)   . H/O hiatal hernia   . Helicobacter pylori gastritis 2008  . Hypercholesterolemia   . Hypertension   . Hyperthyroidism    s/p radiation  . Low back pain   . Migraine   . Nephrolithiasis    Recurring episodes since 2004  . Severe obesity (BMI >= 40) (HCC)   . Sleep apnea    STOP BANG SCORE 6no cpap used  . Type 2 diabetes mellitus with diabetic neuropathy [facility])     Past Surgical History:  Procedure Laterality Date  . ABDOMINAL HYSTERECTOMY    . BACK SURGERY  1993  . BACK SURGERY  1994  . BACK SURGERY  2002  . BACK SURGERY  2011   [place] Left   . CHOLECYSTECTOMY  1996  . COLONOSCOPY  2000 [facility]   INT HEMORRHOIDS/FISSURE  . COLONOSCOPY  2003 [facility], CHANGE IN BOWEL HABITS   INT HEMORRHOIDS  . COLONOSCOPY  2006 [facility]   INT HEMORRHOIDS  . COLONOSCOPY  2007 [facility]   INT HEMORRHOIDS  . CYSTOSCOPY WITH STENT PLACEMENT Left 07/25/2012   Procedure: CYSTOSCOPY, left retograde pyelogram WITH left ureteral  STENT PLACEMENT;  Surgeon: Claybon Jabs, MD;  Location: [facility];  Service: Urology;  Laterality: Left;  . CYSTOSCOPY/RETROGRADE/URETEROSCOPY  12/04/2011   Procedure: CYSTOSCOPY/RETROGRADE/URETEROSCOPY;  Surgeon: Molli Hazard, MD;  Location: [facility];  Service: Urology;  Laterality: Right;  . CYSTOSCOPY/RETROGRADE/URETEROSCOPY/STONE EXTRACTION WITH BASKET Left 09/03/2012   Procedure: CYSTOSCOPY/RETROGRADE pyelogram/digital URETEROSCOPY/STONE EXTRACTION WITH BASKET, left stent removal;  Surgeon: Molli Hazard, MD;  Location: [facility];  Service: Urology;  Laterality: Left;  . ESOPHAGOGASTRODUODENOSCOPY  2008   [facility]:LKGMW hiatal hernia./Normal esophagus without evidence of Barrett's mass, stricture, erosion or ulceration./Normal duodenal bulb and second portion of the duodenum./Diffuse erythema in the body and the antrum with occasional erosion.  Biopsies obtained via cold forceps to evaluate for H. pylori gastritis  . FRACTURE SURGERY  1999   right clavicle  . HOLMIUM LASER APPLICATION Left 1/0/2725   Procedure: HOLMIUM LASER APPLICATION;  Surgeon: Molli Hazard, MD;  Location: [facility];  Service: Urology;  Laterality: Left;  . KNEE ARTHROSCOPY  10/04/2010,    right knee arthroscopy, dr Theda Sers  . LEFT HEART CATH AND CORONARY ANGIOGRAPHY N/A 05/08/2018   Procedure: LEFT HEART CATH AND CORONARY ANGIOGRAPHY;  Surgeon: Troy Sine, MD;  Location: [facility];  Service: Cardiovascular;  Laterality: N/A;  . left knee arthroscopic surgery  1999  . Left salphingectomy secondary to ectopic pregnancy  1991  . PARTIAL HYSTERECTOMY  1991  . rotary cuff  Right 05/14/2014   [facility]  . UPPER GASTROINTESTINAL ENDOSCOPY  2008 abd pain   H. pylori gastritis  . UPPER GASTROINTESTINAL ENDOSCOPY  1996 [place]   PUD    Social History   Socioeconomic History  . Marital status: Single    Spouse name: Not on file  . Number of children: 1  . Years of education: 90  . Highest education level: 12th grade  Occupational History  . Occupation: Disable   Social Needs  . Financial resource strain: Not hard at all  . Food insecurity:    Worry: Never true    Inability: Never true  . Transportation needs:     Medical: No    Non-medical: No  Tobacco Use  . Smoking status: Never Smoker  . Smokeless tobacco: Never Used  Substance and Sexual Activity  . Alcohol use: No  . Drug use: No  . Sexual activity: Not Currently    Partners: Male    Birth control/protection: Surgical    Comment: hyst  Lifestyle  . Physical activity:    Days per week: 7 days    Minutes per session: 20 min  . Stress: Only a little  Relationships  . Social connections:    Talks on phone: More than three times a week    Gets together: Once a week    Attends religious service: 1 to 4 times per year    Active member of club or organization: Yes    Attends meetings of clubs or organizations: Never    Relationship status: Never married  Other Topics Concern  . Not on file  Social History Narrative   Lives alone     Family History  Problem Relation Age of Onset  . Heart disease Mother   . Hypertension Mother   . Cancer Mother        Cervical   . Asthma Mother   . Heart attack Father   . Cancer Father        Prostate  . Colon cancer Father        DECEASED AGE 73  . Colon polyps Neg Hx     Outpatient Encounter Medications as of 09/05/2018  Medication Sig  . albuterol (PROAIR HFA) 108 (90 Base) MCG/ACT inhaler INHALE 2 PUFFS BY MOUTH EVERY 6-8HRS AS NEEDED (Patient taking differently: Inhale 2 puffs into the lungs every 6 (six) hours as needed for wheezing or shortness of breath. )  . amLODipine (NORVASC) 10 MG tablet Take 1 tablet (10 mg total) by mouth daily.  . blood glucose meter kit and supplies Dispense based on patient and insurance preference. Once daily testing dx. e11.9  . carvedilol (COREG) 12.5 MG tablet Take 1 tablet (12.5 mg total) by mouth 2 (two) times daily.  . cloNIDine (CATAPRES - DOSED IN MG/24 HR) 0.3 mg/24hr patch APPLY 1 PATCH TO SKIN ONCE A WEEK  . cloNIDine (CATAPRES) 0.3 MG tablet Take 1 tablet (0.3 mg total) by mouth at bedtime.  . cyclobenzaprine (FLEXERIL) 10 MG tablet TAKE 1 TABLET  BY MOUTH THREE TIMES A DAY  . diazepam (VALIUM) 5 MG tablet Take one tablet by mouth three times daily for muscle spasm  . DULoxetine (CYMBALTA) 60 MG capsule TAKE 1 CAPSULE BY MOUTH TWICE A DAY (Patient taking differently: Take  60 mg by mouth 2 (two) times daily. )  . ergocalciferol (VITAMIN D2) 50000 units capsule Take 1 capsule (50,000 Units total) by mouth once a week. One capsule once weekly  . esomeprazole (NEXIUM) 40 MG capsule Take 1 capsule (40 mg total) by mouth daily.  . fluticasone (FLONASE) 50 MCG/ACT nasal spray Place 2 sprays into both nostrils daily. (Patient taking differently: Place 2 sprays into both nostrils daily as needed for allergies. )  . gabapentin (NEURONTIN) 800 MG tablet TAKE 1 TABLET BY MOUTH 4 TIMES A DAY  . glipiZIDE (GLUCOTROL XL) 10 MG 24 hr tablet Take 1 tablet (10 mg total) by mouth daily with breakfast. Take two tablets every morning with breakfast  . glucose blood test strip Use as instructed three times daily testing dx e11.65  . hydrALAZINE (APRESOLINE) 25 MG tablet Take 1 tablet (25 mg total) by mouth 2 (two) times a day.  . hydrOXYzine (ATARAX/VISTARIL) 10 MG tablet Take 10-30 mg by mouth at bedtime.   . Insulin Glargine (LANTUS SOLOSTAR) 100 UNIT/ML Solostar Pen Inject 30 Units into the skin daily.  . Insulin Pen Needle (B-D ULTRAFINE III SHORT PEN) 31G X 8 MM MISC 1 each by Does not apply route as directed.  Marland Kitchen ipratropium-albuterol (DUONEB) 0.5-2.5 (3) MG/3ML SOLN Take 3 mLs by nebulization every 6 (six) hours as needed. (Patient taking differently: Take 3 mLs by nebulization every 6 (six) hours as needed (SOB). )  . KLOR-CON M20 20 MEQ tablet TAKE 1 TABLET (20 MEQ TOTAL) BY MOUTH 2 (TWO) TIMES DAILY.  Marland Kitchen loratadine (CLARITIN) 10 MG tablet Take 1 tablet (10 mg total) by mouth daily.  . meclizine (ANTIVERT) 25 MG tablet Take 25 mg by mouth 3 (three) times daily as needed for dizziness.  . metFORMIN (GLUCOPHAGE) 1000 MG tablet Take 1 tablet (1,000 mg total)  by mouth 2 (two) times daily.  . montelukast (SINGULAIR) 10 MG tablet TAKE 1 TABLET (10 MG TOTAL) BY MOUTH AT BEDTIME.  . nitroGLYCERIN (NITROSTAT) 0.4 MG SL tablet Place 1 tablet (0.4 mg total) under the tongue every 5 (five) minutes as needed for chest pain.  Marland Kitchen ondansetron (ZOFRAN) 4 MG tablet Take 1 tablet (4 mg total) by mouth every 8 (eight) hours as needed for nausea or vomiting.  Derrill Memo ON 10/15/2018] oxyCODONE (OXYCONTIN) 40 mg 12 hr tablet Take 1 tablet (40 mg total) by mouth every 12 (twelve) hours for 30 days.  . rosuvastatin (CRESTOR) 40 MG tablet Take 40 mg by mouth daily.  . SYMBICORT 160-4.5 MCG/ACT inhaler TAKE 2 PUFFS BY MOUTH TWICE A DAY  . zolpidem (AMBIEN) 10 MG tablet Take 1 tablet (10 mg total) by mouth at bedtime.  . [DISCONTINUED] losartan-hydrochlorothiazide (HYZAAR) 50-12.5 MG per tablet TAKE 1 TABLET BY MOUTH ONCE DAILY *REPLACES DIOVAN/HCTZ*   No facility-administered encounter medications on file as of 09/05/2018.     ALLERGIES: Allergies  Allergen Reactions  . Morphine Other (See Comments)    Reaction with esophagus, unable to swallow.   . Ace Inhibitors Cough  . Aspirin Other (See Comments)    Reaction with esophagus, unable to swallow.   . Losartan Cough  . Tapentadol Other (See Comments)    Nausea, increased sleepiness, h/a  . Tomato Other (See Comments)    Acid reflux due to acid in tomato  . Adhesive [Tape] Rash    VACCINATION STATUS: Immunization History  Administered Date(s) Administered  . Influenza Split 03/30/2011  . Influenza Whole 03/21/2005, 02/09/2006  . Influenza,inj,Quad  PF,6+ Mos 02/11/2014, 07/27/2015, 03/09/2016, 01/29/2017, 02/07/2018  . Pneumococcal Polysaccharide-23 04/17/2006, 09/14/2015  . Td 04/17/2006    Diabetes  She presents for her initial diabetic visit. She has type 2 diabetes mellitus. Her disease course has been worsening. There are no hypoglycemic associated symptoms. Pertinent negatives for hypoglycemia include no  confusion, headaches, pallor or seizures. Associated symptoms include blurred vision, fatigue, polydipsia and polyuria. Pertinent negatives for diabetes include no chest pain and no polyphagia. There are no hypoglycemic complications. Symptoms are worsening. Diabetic complications include heart disease. Risk factors for coronary artery disease include dyslipidemia, diabetes mellitus, family history, hypertension, obesity, sedentary lifestyle and tobacco exposure. Current diabetic treatment includes oral agent (dual therapy). Her weight is fluctuating minimally. She is following a generally unhealthy diet. When asked about meal planning, she reported none. She has had a previous visit with a dietitian. She never participates in exercise. Her home blood glucose trend is increasing steadily. Her overall blood glucose range is >200 mg/dl. (She did not bring any meter nor logs to review with her.  She reports her blood glucose readings are persistently above 300 mg per DL.) An ACE inhibitor/angiotensin II receptor blocker is being taken. She does not see a podiatrist.Eye exam is not current.  Hyperlipidemia  This is a chronic problem. The current episode started more than 1 year ago. The problem is controlled. Exacerbating diseases include diabetes and obesity. Pertinent negatives include no chest pain, myalgias or shortness of breath. Risk factors for coronary artery disease include diabetes mellitus, dyslipidemia, hypertension, obesity and a sedentary lifestyle.  Hypertension  This is a chronic problem. The current episode started more than 1 year ago. The problem is uncontrolled. Associated symptoms include blurred vision. Pertinent negatives include no chest pain, headaches, palpitations or shortness of breath. Risk factors for coronary artery disease include family history, dyslipidemia, diabetes mellitus, obesity, sedentary lifestyle and post-menopausal state. Past treatments include angiotensin blockers.  Hypertensive end-organ damage includes CAD/MI.     Review of Systems  Constitutional: Positive for fatigue. Negative for chills, fever and unexpected weight change.  HENT: Negative for trouble swallowing and voice change.   Eyes: Positive for blurred vision. Negative for visual disturbance.  Respiratory: Negative for cough, shortness of breath and wheezing.   Cardiovascular: Negative for chest pain, palpitations and leg swelling.  Gastrointestinal: Negative for diarrhea, nausea and vomiting.  Endocrine: Positive for polydipsia and polyuria. Negative for cold intolerance, heat intolerance and polyphagia.  Musculoskeletal: Negative for arthralgias and myalgias.  Skin: Negative for color change, pallor, rash and wound.  Neurological: Negative for seizures and headaches.  Psychiatric/Behavioral: Negative for confusion and suicidal ideas.    Objective:    BP (!) 145/90   Pulse 68   Ht 5' 4" (1.626 m)   Wt (!) 311 lb (141.1 kg)   BMI 53.38 kg/m   Wt Readings from Last 3 Encounters:  09/05/18 (!) 311 lb (141.1 kg)  09/04/18 (!) 309 lb 6.4 oz (140.3 kg)  08/26/18 (!) 314 lb (142.4 kg)     Physical Exam Constitutional:      Appearance: She is well-developed.  HENT:     Head: Normocephalic and atraumatic.  Neck:     Musculoskeletal: Normal range of motion and neck supple.     Thyroid: No thyromegaly.     Trachea: No tracheal deviation.  Cardiovascular:     Rate and Rhythm: Normal rate and regular rhythm.  Pulmonary:     Effort: Pulmonary effort is normal.  Abdominal:  Tenderness: There is no abdominal tenderness. There is no guarding.  Musculoskeletal: Normal range of motion.  Skin:    General: Skin is warm and dry.     Coloration: Skin is not pale.     Findings: No erythema or rash.  Neurological:     General: No focal deficit present.     Mental Status: She is alert and oriented to person, place, and time.     Cranial Nerves: No cranial nerve deficit.      Coordination: Coordination normal.     Deep Tendon Reflexes: Reflexes are normal and symmetric.  Psychiatric:        Mood and Affect: Mood normal.        Behavior: Behavior normal.        Judgment: Judgment normal.       CMP ( most recent) CMP     Component Value Date/Time   NA 137 08/28/2018 0903   K 4.6 08/28/2018 0903   CL 98 08/28/2018 0903   CO2 29 08/28/2018 0903   GLUCOSE 393 (H) 08/28/2018 0903   BUN 17 08/28/2018 0903   CREATININE 0.90 08/28/2018 0903   CALCIUM 9.6 08/28/2018 0903   PROT 7.6 08/28/2018 0903   ALBUMIN 3.9 05/05/2018 2250   AST 37 (H) 08/28/2018 0903   ALT 51 (H) 08/28/2018 0903   ALKPHOS 63 05/05/2018 2250   BILITOT 0.5 08/28/2018 0903   GFRNONAA 70 08/28/2018 0903   GFRAA 82 08/28/2018 0903     Diabetic Labs (most recent): Lab Results  Component Value Date   HGBA1C >14.0 (H) 08/28/2018   HGBA1C 7.4 (H) 02/06/2018   HGBA1C 7.2 (H) 10/08/2017     Lipid Panel ( most recent) Lipid Panel     Component Value Date/Time   CHOL 90 08/28/2018 0903   TRIG 179 (H) 08/28/2018 0903   HDL 31 (L) 08/28/2018 0903   CHOLHDL 2.9 08/28/2018 0903   VLDL 16 05/06/2018 0335   LDLCALC 33 08/28/2018 0903       Assessment & Plan:   1. Uncontrolled type 2 diabetes mellitus with hyperglycemia ([place])   - Angela Sites has currently uncontrolled symptomatic type 2 DM since  58 years of age,  with most recent A1c of  >14 %. Recent labs reviewed. - I had a long discussion with her about the progressive nature of diabetes and the pathology behind its complications. -her diabetes is complicated by coronary artery disease, obesity/sedentary life and she remains at a high risk for more acute and chronic complications which include CAD, CVA, CKD, retinopathy, and neuropathy. These are all discussed in detail with her.  - I have counseled her on diet management and weight loss, by adopting a carbohydrate restricted/protein rich diet. - she admits that there is a  room for improvement in her food and drink choices. - Suggestion is made for her to avoid simple carbohydrates  from her diet including Cakes, Sweet Desserts, Ice Cream, Soda (diet and regular), Sweet Tea, Candies, Chips, Cookies, Store Bought Juices, Alcohol in Excess of  1-2 drinks a day, Artificial Sweeteners,  Coffee Creamer, and "Sugar-free" Products. This will help patient to have more stable blood glucose profile and potentially avoid unintended weight gain.  - I encouraged her to switch to  unprocessed or minimally processed complex starch and increased protein intake (animal or plant source), fruits, and vegetables.  - she is advised to stick to a routine mealtimes to eat 3 meals  a day and avoid unnecessary snacks (  to snack only to correct hypoglycemia).   - she will be scheduled with Jearld Fenton, RDN, CDE for individualized diabetes education.  - I have approached her with the following individualized plan to manage diabetes and patient agrees:   -Her presentation with symptomatic severe hyperglycemia makes her an immediate candidate for insulin treatment, patient reluctantly accepts.  -I discussed and prescribed Lantus 30 units nightly associated with  strict monitoring of glucose 4 times a day-before meals and at bedtime. - she is warned not to take insulin without proper monitoring per orders.  - she is encouraged to call clinic for blood glucose levels less than 70 or above 300 mg /dl. - she is advised to continue metformin 1000 mg p.o. twice daily after breakfast and after supper, lower glipizide to 10 mg XL daily at breakfast only.  - she will be considered for incretin therapy as appropriate next visit.  - Patient specific target  A1c;  LDL, HDL, Triglycerides, and  Waist Circumference were discussed in detail.  2) Blood Pressure /Hypertension:  her blood pressure is uncontrolled to target.   she is advised to continue her current medications including amlodipine 10 mg  p.o. daily, carvedilol 12.5 mg p.o. twice daily, clonidine 0.3 mg/24-hour patch, clonidine 0.3 mg daily at bedtime, hydralazine 25 mg p.o. twice daily.  She will need work-up to rule out primary hypertension on subsequent visits. 3) Lipids/Hyperlipidemia:   Review of her recent lipid panel showed  controlled  LDL at 33.  she  is advised to continue    Crestor 40 mg daily at bedtime.  Side effects and precautions discussed with her.  4)  Weight/Diet:  Body mass index is 53.38 kg/m.  -   clearly complicating her diabetes care.  I discussed with her the fact that loss of 5 - 10% of her  current body weight will have the most impact on her diabetes management.  CDE Consult will be initiated . Exercise, and detailed carbohydrates information provided  -  detailed on discharge instructions.  5) hypothyroidism: Likely new diagnosis.  She will be initiated on levothyroxine 50 mcg p.o. every morning.  6) Chronic Care/Health Maintenance:  -she  is on Statin medications and  is encouraged to initiate and continue to follow up with Ophthalmology, Dentist,  Podiatrist at least yearly or according to recommendations, and advised to  stay away from smoking. I have recommended yearly flu vaccine and pneumonia vaccine at least every 5 years; moderate intensity exercise for up to 150 minutes weekly; and  sleep for at least 7 hours a day.  - she is  advised to maintain close follow up with Fayrene Helper, MD for primary care needs, as well as her other providers for optimal and coordinated care.  - Time spent with the patient: 45 minutes, of which >50% was spent in obtaining information about her symptoms, reviewing her previous labs/studies, evaluations, and treatments, counseling her about her currently symptomatic severe hyperglycemia from type 2 diabetes, hypertension, hyperlipidemia, hypothyroidism, and developing plans for long term treatment based on the latest standards of care/guidelines.  Please refer to  " Patient Self Inventory" in the Media  tab for reviewed elements of pertinent patient history.  [place] participated in the discussions, expressed understanding, and voiced agreement with the above plans.  All questions were answered to her satisfaction. she is encouraged to contact clinic should she have any questions or concerns prior to her return visit.  Follow up plan: - Return in about  10 days (around 09/15/2018) for Follow up with Meter and Logs Only - no Labs.  Glade Lloyd, MD [facility] [address] [place], [place] [address] Phone: [phone]  Fax: [phone]    09/05/2018, 12:32 PM  This note was partially dictated with voice recognition software. Similar sounding words can be transcribed inadequately or may not  be corrected upon review.

## 2018-09-05 NOTE — Patient Instructions (Signed)

## 2018-09-07 ENCOUNTER — Encounter: Payer: Self-pay | Admitting: Family Medicine

## 2018-09-07 NOTE — Assessment & Plan Note (Signed)
Marked deterioration, now requiring insulin and Endo management Ms. Hanak is reminded of the importance of commitment to daily physical activity for 30 minutes or more, as able and the need to limit carbohydrate intake to 30 to 60 grams per meal to help with blood sugar control.   The need to take medication as prescribed, test blood sugar as directed, and to call between visits if there is a concern that blood sugar is uncontrolled is also discussed.   Ms. Nistler is reminded of the importance of daily foot exam, annual eye examination, and good blood sugar, blood pressure and cholesterol control.  Diabetic Labs Latest Ref Rng & Units 08/28/2018 05/22/2018 05/09/2018 05/08/2018 05/06/2018  HbA1c <5.7 % of total Hgb >14.0(H) - - - -  Microalbumin mg/dL - - - - -  Micro/Creat Ratio <30 mcg/mg creat - - - - -  Chol <200 mg/dL 90 - - - 174  HDL > OR = 50 mg/dL 31(L) - - - 49  Calc LDL mg/dL (calc) 33 - - - 109(H)  Triglycerides <150 mg/dL 179(H) - - - 80  Creatinine 0.50 - 1.05 mg/dL 0.90 1.17(H) 0.81 0.80 0.85   BP/Weight 09/05/2018 09/04/2018 08/26/2018 08/22/2018 08/05/2018 05/21/2018 1/32/4401  Systolic BP 027 253 664 403 474 259 563  Diastolic BP 90 84 99 875 86 86 70  Wt. (Lbs) 311 309.4 314 - 314 314 314  BMI 53.38 53.11 53.9 53.9 53.9 53.9 53.9   Foot/eye exam completion dates Latest Ref Rng & Units 02/07/2018 11/06/2016  Eye Exam No Retinopathy - -  Foot Form Completion - Done Done

## 2018-09-07 NOTE — Assessment & Plan Note (Signed)
Hyperlipidemia:Low fat diet discussed and encouraged.   Lipid Panel  Lab Results  Component Value Date   CHOL 90 08/28/2018   HDL 31 (L) 08/28/2018   LDLCALC 33 08/28/2018   TRIG 179 (H) 08/28/2018   CHOLHDL 2.9 08/28/2018   Needs to reduce fatty food , as TG elevated

## 2018-09-07 NOTE — Assessment & Plan Note (Signed)
Deteriorated.  Patient re-educated about  the importance of commitment to a  minimum of 150 minutes of exercise per week as able.  The importance of healthy food choices with portion control discussed, as well as eating regularly and within a 12 hour window most days. The need to choose "clean , green" food 50 to 75% of the time is discussed, as well as to make water the primary drink and set a goal of 64 ounces water daily.  Encouraged to start a food diary,  and to consider  joining a support group. Sample diet sheets offered. Goals set by the patient for the next several months.   Weight /BMI 09/05/2018 09/04/2018 08/26/2018  WEIGHT 311 lb 309 lb 6.4 oz 314 lb  HEIGHT 5\' 4"  5\' 4"  5\' 4"   BMI 53.38 kg/m2 53.11 kg/m2 53.9 kg/m2

## 2018-09-07 NOTE — Assessment & Plan Note (Signed)
Controlled, no change in medication DASH diet and commitment to daily physical activity for a minimum of 30 minutes discussed and encouraged, as a part of hypertension management. The importance of attaining a healthy weight is also discussed.  BP/Weight 09/05/2018 09/04/2018 08/26/2018 08/22/2018 08/05/2018 05/21/2018 09/08/2583  Systolic BP 277 824 235 361 443 154 008  Diastolic BP 90 84 99 676 86 86 70  Wt. (Lbs) 311 309.4 314 - 314 314 314  BMI 53.38 53.11 53.9 53.9 53.9 53.9 53.9

## 2018-09-07 NOTE — Progress Notes (Signed)
Angela [name]     MRN: [id number]      DOB: [birth year]   HPI Angela [name] is here for follow up and re-evaluation of chronic medical conditions, specifically markedly uncontrolled diabetes, now requiring insulin Reports fatigue,ia and polyuria with most recent HBA1C over 14, she is naive to insulin and needs Endocrinology to manage her diabetes, and an urgent referral is placed. I am opting to hold on any new prescription because of this.   Questions or concerns regarding consultations or procedures which the PT has had in the interim are  Addressed. Recently started on hydralazine by Cardiology, no adverse s/e reported The PT denies any adverse reactions to current medications since the last visit.    ROS See HPI  Denies recent fever or chills. Denies sinus pressure, nasal congestion, ear pain or sore throat. Denies chest congestion, productive cough or wheezing. Denies chest pains, palpitations and leg swelling Denies abdominal pain, nausea, vomiting,diarrhea or constipation.   Denies dysuria, frequency, hesitancy or incontinence. C/o chronic  joint pain, swelling and limitation in mobility. Denies headaches, seizures, numbness, or tingling. Denies depression, anxiety or insomnia. Denies skin break down or rash.   PE  BP 128/84   Pulse 75   Resp 15   Ht 5\' 4"  (1.626 m)   Wt (!) 309 lb 6.4 oz (140.3 kg)   SpO2 98%   BMI 53.11 kg/m   Patient alert and oriented and in no cardiopulmonary distress.  HEENT: No facial asymmetry, EOMI,   oropharynx pink and moist.  Neck supple no JVD, no mass.  Chest: Clear to auscultation bilaterally.  CVS: S1, S2 no murmurs, no S3.Regular rate.  ABD: Soft non tender.   Ext: No edema  MS: Decreased  ROM spine, shoulders, hips and knees.  Skin: Intact, no ulcerations or rash noted.  Psych: Good eye contact, normal affect. Memory intact not anxious or depressed appearing.  CNS: CN 2-12 intact, power,  normal throughout.no focal  deficits noted.   Assessment & Plan  Essential hypertension, benign Controlled, no change in medication DASH diet and commitment to daily physical activity for a minimum of 30 minutes discussed and encouraged, as a part of hypertension management. The importance of attaining a healthy weight is also discussed.  BP/Weight 09/05/2018 09/04/2018 08/26/2018 08/22/2018 08/05/2018 05/21/2018 5/78/4696  Systolic BP 295 284 132 440 102 725 366  Diastolic BP 90 84 99 440 86 86 70  Wt. (Lbs) 311 309.4 314 - 314 314 314  BMI 53.38 53.11 53.9 53.9 53.9 53.9 53.9       Mixed hyperlipidemia Hyperlipidemia:Low fat diet discussed and encouraged.   Lipid Panel  Lab Results  Component Value Date   CHOL 90 08/28/2018   HDL 31 (L) 08/28/2018   LDLCALC 33 08/28/2018   TRIG 179 (H) 08/28/2018   CHOLHDL 2.9 08/28/2018   Needs to reduce fatty food , as TG elevated    Morbid obesity ([place]) Deteriorated.  Patient re-educated about  the importance of commitment to a  minimum of 150 minutes of exercise per week as able.  The importance of healthy food choices with portion control discussed, as well as eating regularly and within a 12 hour window most days. The need to choose "clean , green" food 50 to 75% of the time is discussed, as well as to make water the primary drink and set a goal of 64 ounces water daily.  Encouraged to start a food diary,  and to consider  joining a support  group. Sample diet sheets offered. Goals set by the patient for the next several months.   Weight /BMI 09/05/2018 09/04/2018 08/26/2018  WEIGHT 311 lb 309 lb 6.4 oz 314 lb  HEIGHT 5\' 4"  5\' 4"  5\' 4"   BMI 53.38 kg/m2 53.11 kg/m2 53.9 kg/m2      Uncontrolled type 2 diabetes mellitus with hyperglycemia ([facility]CC) Marked deterioration, now requiring insulin and Endo management Angela [name] is reminded of the importance of commitment to daily physical activity for 30 minutes or more, as able and the need to limit carbohydrate intake to  30 to 60 grams per meal to help with blood sugar control.   The need to take medication as prescribed, test blood sugar as directed, and to call between visits if there is a concern that blood sugar is uncontrolled is also discussed.   Angela [name] is reminded of the importance of daily foot exam, annual eye examination, and good blood sugar, blood pressure and cholesterol control.  Diabetic Labs Latest Ref Rng & Units 08/28/2018 05/22/2018 05/09/2018 05/08/2018 05/06/2018  HbA1c <5.7 % of total Hgb >14.0(H) - - - -  Microalbumin mg/dL - - - - -  Micro/Creat Ratio <30 mcg/mg creat - - - - -  Chol <200 mg/dL 90 - - - 174  HDL > OR = 50 mg/dL 31(L) - - - 49  Calc LDL mg/dL (calc) 33 - - - 109(H)  Triglycerides <150 mg/dL 179(H) - - - 80  Creatinine 0.50 - 1.05 mg/dL 0.90 1.17(H) 0.81 0.80 0.85   BP/Weight 09/05/2018 09/04/2018 08/26/2018 08/22/2018 08/05/2018 05/21/2018 3/88/8280  Systolic BP 034 917 915 056 979 480 165  Diastolic BP 90 84 99 537 86 86 70  Wt. (Lbs) 311 309.4 314 - 314 314 314  BMI 53.38 53.11 53.9 53.9 53.9 53.9 53.9   Foot/eye exam completion dates Latest Ref Rng & Units 02/07/2018 11/06/2016  Eye Exam No Retinopathy - -  Foot Form Completion - Done Done

## 2018-09-09 ENCOUNTER — Other Ambulatory Visit: Payer: Self-pay | Admitting: Family Medicine

## 2018-09-16 ENCOUNTER — Other Ambulatory Visit: Payer: Self-pay

## 2018-09-16 MED ORDER — GLUCOSE BLOOD VI STRP: ORAL_STRIP | 5 refills | Status: DC

## 2018-09-19 ENCOUNTER — Ambulatory Visit: Payer: Medicaid Other | Admitting: "Endocrinology

## 2018-09-19 ENCOUNTER — Encounter: Payer: Self-pay | Admitting: "Endocrinology

## 2018-09-19 ENCOUNTER — Other Ambulatory Visit: Payer: Self-pay

## 2018-09-19 MED ORDER — GLUCOSE BLOOD VI STRP: ORAL_STRIP | 5 refills | Status: DC

## 2018-09-20 ENCOUNTER — Ambulatory Visit (INDEPENDENT_AMBULATORY_CARE_PROVIDER_SITE_OTHER): Payer: Medicare Other | Admitting: "Endocrinology

## 2018-09-20 ENCOUNTER — Other Ambulatory Visit: Payer: Self-pay

## 2018-09-20 ENCOUNTER — Encounter: Payer: Self-pay | Admitting: "Endocrinology

## 2018-09-20 DIAGNOSIS — E1165 Type 2 diabetes mellitus with hyperglycemia: Secondary | ICD-10-CM | POA: Diagnosis not present

## 2018-09-20 DIAGNOSIS — E782 Mixed hyperlipidemia: Secondary | ICD-10-CM

## 2018-09-20 DIAGNOSIS — I10 Essential (primary) hypertension: Secondary | ICD-10-CM | POA: Diagnosis not present

## 2018-09-20 NOTE — Progress Notes (Signed)
09/20/2018, 10:23 AM                                                    Endocrinology Telehealth Visit Follow up Note -During COVID -19 Pandemic  This visit type was conducted due to national recommendations for restrictions regarding the COVID-19 Pandemic  in an effort to limit this patient's exposure and mitigate transmission of the corona virus.  Due to her co-morbid illnesses, Angela [name] is at  moderate to high risk for complications without adequate follow up.  This format is felt to be most appropriate for her at this time.  I connected with this patient on 09/20/2018   by telephone and verified that I am speaking with the correct person using two identifiers. Angela [name], February 26, 1961. she has verbally consented to this visit. All issues noted in this document were discussed and addressed. The format was not optimal for physical exam.     Subjective:    [name], female    DOB: May 01, 1961.  Angela [name] is being engaged in telehealth in consultation for management of currently uncontrolled symptomatic diabetes requested by  Fayrene Helper, MD.   Past Medical History:  Diagnosis Date  . Anxiety   . Arthritis   . Asthma   . Coronary atherosclerosis of native coronary artery    Mild nonobstructive CAD by cardiac catheterization 2008 - Dr. Terrence Dupont  . Degenerative disc disease   . Depression   . Dysrhythmia   . Essential hypertension, benign   . Folliculitis 07/13/9456  . GERD (gastroesophageal reflux disease)   . H/O hiatal hernia   . Helicobacter pylori gastritis 2008  . Hypercholesterolemia   . Hypertension   . Hyperthyroidism    s/p radiation  . Low back pain   . Migraine   . Nephrolithiasis    Recurring episodes since 2004  . Severe obesity (BMI >= 40) (HCC)   . Sleep apnea    STOP BANG SCORE 6no cpap used  . Type 2 diabetes mellitus with diabetic neuropathy [facility])     Past Surgical History:  Procedure Laterality  Date  . ABDOMINAL HYSTERECTOMY    . BACK SURGERY  1993  . BACK SURGERY  1994  . BACK SURGERY  2002  . BACK SURGERY  2011   [place] Left   . CHOLECYSTECTOMY  1996  . COLONOSCOPY  2000 [facility]   INT HEMORRHOIDS/FISSURE  . COLONOSCOPY  2003 [facility], CHANGE IN BOWEL HABITS   INT HEMORRHOIDS  . COLONOSCOPY  2006 [facility]   INT HEMORRHOIDS  . COLONOSCOPY  2007 [facility]   INT HEMORRHOIDS  . CYSTOSCOPY WITH STENT PLACEMENT Left 07/25/2012   Procedure: CYSTOSCOPY, left retograde pyelogram WITH left ureteral  STENT PLACEMENT;  Surgeon: Claybon Jabs, MD;  Location: [facility];  Service: Urology;  Laterality: Left;  . CYSTOSCOPY/RETROGRADE/URETEROSCOPY  12/04/2011   Procedure: CYSTOSCOPY/RETROGRADE/URETEROSCOPY;  Surgeon: Molli Hazard, MD;  Location: [facility];  Service: Urology;  Laterality: Right;  . CYSTOSCOPY/RETROGRADE/URETEROSCOPY/STONE EXTRACTION WITH BASKET Left 09/03/2012   Procedure: CYSTOSCOPY/RETROGRADE pyelogram/digital URETEROSCOPY/STONE EXTRACTION WITH BASKET, left stent removal;  Surgeon: Molli Hazard, MD;  Location: [facility];  Service: Urology;  Laterality: Left;  . ESOPHAGOGASTRODUODENOSCOPY  2008   PFY:[id number] hiatal hernia./Normal esophagus without evidence of  Barrett's mass, stricture, erosion or ulceration./Normal duodenal bulb and second portion of the duodenum./Diffuse erythema in the body and the antrum with occasional erosion.  Biopsies obtained via cold forceps to evaluate for H. pylori gastritis  . FRACTURE SURGERY  1999   right clavicle  . HOLMIUM LASER APPLICATION Left 5/0/3888   Procedure: HOLMIUM LASER APPLICATION;  Surgeon: Molli Hazard, MD;  Location: [facility];  Service: Urology;  Laterality: Left;  . KNEE ARTHROSCOPY  10/04/2010,    right knee arthroscopy, dr Theda Sers  . LEFT HEART CATH AND CORONARY ANGIOGRAPHY N/A 05/08/2018   Procedure: LEFT HEART CATH AND CORONARY ANGIOGRAPHY;  Surgeon: Troy Sine, MD;  Location: [facility];  Service: Cardiovascular;  Laterality: N/A;  . left knee arthroscopic surgery  1999  . Left salphingectomy secondary to ectopic pregnancy  1991  . PARTIAL HYSTERECTOMY  1991  . rotary cuff  Right 05/14/2014   [facility]  . UPPER GASTROINTESTINAL ENDOSCOPY  2008 abd pain   H. pylori gastritis  . UPPER GASTROINTESTINAL ENDOSCOPY  [place] [facility] [place]   PUD    Social History   Socioeconomic History  . Marital status: Single    Spouse name: Not on file  . Number of children: 1  . Years of education: 67  . Highest education level: 12th grade  Occupational History  . Occupation: Disable   Social Needs  . Financial resource strain: Not hard at all  . Food insecurity:    Worry: Never true    Inability: Never true  . Transportation needs:    Medical: No    Non-medical: No  Tobacco Use  . Smoking status: Never Smoker  . Smokeless tobacco: Never Used  Substance and Sexual Activity  . Alcohol use: No  . Drug use: No  . Sexual activity: Not Currently    Partners: Male    Birth control/protection: Surgical    Comment: hyst  Lifestyle  . Physical activity:    Days per week: 7 days    Minutes per session: 20 min  . Stress: Only a little  Relationships  . Social connections:    Talks on phone: More than three times a week    Gets together: Once a week    Attends religious service: 1 to 4 times per year    Active member of club or organization: Yes    Attends meetings of clubs or organizations: Never    Relationship status: Never married  Other Topics Concern  . Not on file  Social History Narrative   Lives alone     Family History  Problem Relation Age of Onset  . Heart disease Mother   . Hypertension Mother   . Cancer Mother        Cervical   . Asthma Mother   . Heart attack Father   . Cancer Father        Prostate  . Colon cancer Father        DECEASED AGE 53  . Colon polyps Neg Hx     Outpatient Encounter Medications as of 09/20/2018  Medication Sig  .  albuterol (PROAIR HFA) 108 (90 Base) MCG/ACT inhaler INHALE 2 PUFFS BY MOUTH EVERY 6-8HRS AS NEEDED (Patient taking differently: Inhale 2 puffs into the lungs every 6 (six) hours as needed for wheezing or shortness of breath. )  . amLODipine (NORVASC) 10 MG tablet Take 1 tablet (10 mg total) by mouth daily.  . blood glucose meter kit and supplies  Dispense based on patient and insurance preference. Once daily testing dx. e11.9  . carvedilol (COREG) 12.5 MG tablet Take 1 tablet (12.5 mg total) by mouth 2 (two) times daily.  . cloNIDine (CATAPRES - DOSED IN MG/24 HR) 0.3 mg/24hr patch APPLY 1 PATCH TO SKIN ONCE A WEEK  . cloNIDine (CATAPRES) 0.3 MG tablet Take 1 tablet (0.3 mg total) by mouth at bedtime.  . cyclobenzaprine (FLEXERIL) 10 MG tablet TAKE 1 TABLET BY MOUTH THREE TIMES A DAY  . diazepam (VALIUM) 5 MG tablet Take one tablet by mouth three times daily for muscle spasm  . DULoxetine (CYMBALTA) 60 MG capsule TAKE 1 CAPSULE BY MOUTH TWICE A DAY (Patient taking differently: Take 60 mg by mouth 2 (two) times daily. )  . ergocalciferol (VITAMIN D2) 50000 units capsule Take 1 capsule (50,000 Units total) by mouth once a week. One capsule once weekly  . esomeprazole (NEXIUM) 40 MG capsule Take 1 capsule (40 mg total) by mouth daily.  . fluticasone (FLONASE) 50 MCG/ACT nasal spray Place 2 sprays into both nostrils daily. (Patient taking differently: Place 2 sprays into both nostrils daily as needed for allergies. )  . gabapentin (NEURONTIN) 800 MG tablet TAKE 1 TABLET BY MOUTH 4 TIMES A DAY  . glipiZIDE (GLUCOTROL XL) 10 MG 24 hr tablet Take 1 tablet (10 mg total) by mouth daily with breakfast. Take two tablets every morning with breakfast  . glucose blood test strip Use as instructed four times daily testing dx e11.65  . hydrALAZINE (APRESOLINE) 25 MG tablet Take 1 tablet (25 mg total) by mouth 2 (two) times a day.  . hydrOXYzine (ATARAX/VISTARIL) 10 MG tablet Take 10-30 mg by mouth at bedtime.   .  Insulin Glargine (LANTUS SOLOSTAR) 100 UNIT/ML Solostar Pen Inject 30 Units into the skin daily.  . Insulin Pen Needle (B-D ULTRAFINE III SHORT PEN) 31G X 8 MM MISC 1 each by Does not apply route as directed.  Marland Kitchen ipratropium-albuterol (DUONEB) 0.5-2.5 (3) MG/3ML SOLN Take 3 mLs by nebulization every 6 (six) hours as needed. (Patient taking differently: Take 3 mLs by nebulization every 6 (six) hours as needed (SOB). )  . KLOR-CON M20 20 MEQ tablet TAKE 1 TABLET (20 MEQ TOTAL) BY MOUTH 2 (TWO) TIMES DAILY.  Marland Kitchen levothyroxine (SYNTHROID) 50 MCG tablet Take 1 tablet (50 mcg total) by mouth daily before breakfast.  . loratadine (CLARITIN) 10 MG tablet Take 1 tablet (10 mg total) by mouth daily.  . meclizine (ANTIVERT) 25 MG tablet Take 25 mg by mouth 3 (three) times daily as needed for dizziness.  . metFORMIN (GLUCOPHAGE) 1000 MG tablet Take 1 tablet (1,000 mg total) by mouth 2 (two) times daily.  . metoprolol tartrate (LOPRESSOR) 25 MG tablet TAKE 0.5 TABLETS (12.5 MG TOTAL) BY MOUTH 2 (TWO) TIMES DAILY.  . montelukast (SINGULAIR) 10 MG tablet TAKE 1 TABLET (10 MG TOTAL) BY MOUTH AT BEDTIME.  . nitroGLYCERIN (NITROSTAT) 0.4 MG SL tablet Place 1 tablet (0.4 mg total) under the tongue every 5 (five) minutes as needed for chest pain.  Marland Kitchen ondansetron (ZOFRAN) 4 MG tablet Take 1 tablet (4 mg total) by mouth every 8 (eight) hours as needed for nausea or vomiting.  Derrill Memo ON 10/15/2018] oxyCODONE (OXYCONTIN) 40 mg 12 hr tablet Take 1 tablet (40 mg total) by mouth every 12 (twelve) hours for 30 days.  . rosuvastatin (CRESTOR) 40 MG tablet Take 40 mg by mouth daily.  . SYMBICORT 160-4.5 MCG/ACT inhaler TAKE 2 PUFFS BY  MOUTH TWICE A DAY  . zolpidem (AMBIEN) 10 MG tablet Take 1 tablet (10 mg total) by mouth at bedtime.  . [DISCONTINUED] losartan-hydrochlorothiazide (HYZAAR) 50-12.5 MG per tablet TAKE 1 TABLET BY MOUTH ONCE DAILY *REPLACES DIOVAN/HCTZ*   No facility-administered encounter medications on file as of  09/20/2018.     ALLERGIES: Allergies  Allergen Reactions  . Morphine Other (See Comments)    Reaction with esophagus, unable to swallow.   . Ace Inhibitors Cough  . Aspirin Other (See Comments)    Reaction with esophagus, unable to swallow.   . Losartan Cough  . Tapentadol Other (See Comments)    Nausea, increased sleepiness, h/a  . Tomato Other (See Comments)    Acid reflux due to acid in tomato  . Adhesive [Tape] Rash    VACCINATION STATUS: Immunization History  Administered Date(s) Administered  . Influenza Split 03/30/2011  . Influenza Whole 03/21/2005, 02/09/2006  . Influenza,inj,Quad PF,6+ Mos 02/11/2014, 07/27/2015, 03/09/2016, 01/29/2017, 02/07/2018  . Pneumococcal Polysaccharide-23 04/17/2006, 09/14/2015  . Td 04/17/2006    Diabetes  She presents for her follow-up diabetic visit. She has type 2 diabetes mellitus. Her disease course has been improving. There are no hypoglycemic associated symptoms. Pertinent negatives for hypoglycemia include no confusion, headaches, pallor or seizures. Associated symptoms include fatigue. Pertinent negatives for diabetes include no blurred vision, no chest pain, no polydipsia, no polyphagia and no polyuria. There are no hypoglycemic complications. Symptoms are improving. Diabetic complications include heart disease. Risk factors for coronary artery disease include dyslipidemia, diabetes mellitus, family history, hypertension, obesity, sedentary lifestyle and tobacco exposure. Current diabetic treatment includes oral agent (dual therapy). Her weight is fluctuating minimally. She is following a generally unhealthy diet. When asked about meal planning, she reported none. She has had a previous visit with a dietitian. She never participates in exercise. Her home blood glucose trend is increasing steadily. Her breakfast blood glucose range is generally 130-140 mg/dl. Her lunch blood glucose range is generally 130-140 mg/dl. Her dinner blood glucose  range is generally 130-140 mg/dl. Her bedtime blood glucose range is generally 130-140 mg/dl. Her overall blood glucose range is 130-140 mg/dl. An ACE inhibitor/angiotensin II receptor blocker is being taken. She does not see a podiatrist.Eye exam is not current.  Hyperlipidemia  This is a chronic problem. The current episode started more than 1 year ago. The problem is controlled. Exacerbating diseases include diabetes and obesity. Pertinent negatives include no chest pain, myalgias or shortness of breath. Risk factors for coronary artery disease include diabetes mellitus, dyslipidemia, hypertension, obesity and a sedentary lifestyle.  Hypertension  This is a chronic problem. The current episode started more than 1 year ago. The problem is uncontrolled. Pertinent negatives include no blurred vision, chest pain, headaches, palpitations or shortness of breath. Risk factors for coronary artery disease include family history, dyslipidemia, diabetes mellitus, obesity, sedentary lifestyle and post-menopausal state. Past treatments include angiotensin blockers. Hypertensive end-organ damage includes CAD/MI.     Review of Systems  Constitutional: Positive for fatigue. Negative for chills, fever and unexpected weight change.  HENT: Negative for trouble swallowing and voice change.   Eyes: Negative for blurred vision and visual disturbance.  Respiratory: Negative for cough, shortness of breath and wheezing.   Cardiovascular: Negative for chest pain, palpitations and leg swelling.  Gastrointestinal: Negative for diarrhea, nausea and vomiting.  Endocrine: Negative for cold intolerance, heat intolerance, polydipsia, polyphagia and polyuria.  Musculoskeletal: Negative for arthralgias and myalgias.  Skin: Negative for color change, pallor, rash and wound.  Neurological: Negative for seizures and headaches.  Psychiatric/Behavioral: Negative for confusion and suicidal ideas.    Objective:    There were no  vitals taken for this visit.  Wt Readings from Last 3 Encounters:  09/05/18 (!) 311 lb (141.1 kg)  09/04/18 (!) 309 lb 6.4 oz (140.3 kg)  08/26/18 (!) 314 lb (142.4 kg)       CMP ( most recent) CMP     Component Value Date/Time   NA 137 08/28/2018 0903   K 4.6 08/28/2018 0903   CL 98 08/28/2018 0903   CO2 29 08/28/2018 0903   GLUCOSE 393 (H) 08/28/2018 0903   BUN 17 08/28/2018 0903   CREATININE 0.90 08/28/2018 0903   CALCIUM 9.6 08/28/2018 0903   PROT 7.6 08/28/2018 0903   ALBUMIN 3.9 05/05/2018 2250   AST 37 (H) 08/28/2018 0903   ALT 51 (H) 08/28/2018 0903   ALKPHOS 63 05/05/2018 2250   BILITOT 0.5 08/28/2018 0903   GFRNONAA 70 08/28/2018 0903   GFRAA 82 08/28/2018 0903     Diabetic Labs (most recent): Lab Results  Component Value Date   HGBA1C >14.0 (H) 08/28/2018   HGBA1C 7.4 (H) 02/06/2018   HGBA1C 7.2 (H) 10/08/2017     Lipid Panel ( most recent) Lipid Panel     Component Value Date/Time   CHOL 90 08/28/2018 0903   TRIG 179 (H) 08/28/2018 0903   HDL 31 (L) 08/28/2018 0903   CHOLHDL 2.9 08/28/2018 0903   VLDL 16 05/06/2018 0335   LDLCALC 33 08/28/2018 0903       Assessment & Plan:   1. Uncontrolled type 2 diabetes mellitus with hyperglycemia ([place])   - [place] has currently uncontrolled symptomatic type 2 DM since  58 years of age. -After she was initiated on basal insulin, she reports dramatic improvement in her glycemic profile ranging from 1 15-1 84 in the last 7 days.  This is despite her recent A1c of  >14 %. Recent labs reviewed.  -her diabetes is complicated by coronary artery disease, obesity/sedentary life and she remains at a high risk for more acute and chronic complications which include CAD, CVA, CKD, retinopathy, and neuropathy. These are all discussed in detail with her.  - I have counseled her on diet management and weight loss, by adopting a carbohydrate restricted/protein rich diet.  - Patient admits there is a room for  improvement in her diet and drink choices. -  Suggestion is made for her to avoid simple carbohydrates  from her diet including Cakes, Sweet Desserts / Pastries, Ice Cream, Soda (diet and regular), Sweet Tea, Candies, Chips, Cookies, Store Bought Juices, Alcohol in Excess of  1-2 drinks a day, Artificial Sweeteners, and "Sugar-free" Products. This will help patient to have stable blood glucose profile and potentially avoid unintended weight gain.  - I encouraged her to switch to  unprocessed or minimally processed complex starch and increased protein intake (animal or plant source), fruits, and vegetables.  - she is advised to stick to a routine mealtimes to eat 3 meals  a day and avoid unnecessary snacks ( to snack only to correct hypoglycemia).   - she has been scheduled with Jearld Fenton, RDN, CDE for individualized diabetes education.  - I have approached her with the following individualized plan to manage diabetes and patient agrees:   -She has quickly responded to basal insulin, advised to continue Lantus 30 units nightly associated with monitoring of blood glucose twice a day-before breakfast and at bedtime.    -  She will not need prandial insulin for now.    - she is warned not to take insulin without proper monitoring per orders.  - she is encouraged to call clinic for blood glucose levels less than 70 or above 300 mg /dl. - she is advised to continue metformin 1000 mg p.o. twice daily after breakfast and after supper, lower glipizide to 10 mg XL daily at breakfast only.  - she will be considered for incretin therapy as appropriate next visit.   2) Blood Pressure /Hypertension: she is advised to home monitor blood pressure and report if > 140/90 on 2 separate readings.  she is advised to continue her current medications including amlodipine 10 mg p.o. daily, carvedilol 12.5 mg p.o. twice daily, clonidine 0.3 mg/24-hour patch, clonidine 0.3 mg daily at bedtime, hydralazine 25 mg  p.o. twice daily.  She will need work-up to rule out primary hypertension on subsequent visits. 3) Lipids/Hyperlipidemia:   Review of her recent lipid panel showed  controlled  LDL at 33.  she  is advised to continue    Crestor 40 mg daily at bedtime.  Side effects and precautions discussed with her.  4)  Weight/Diet: She is obese, clearly complicating her diabetes care.  I discussed with her the fact that loss of 5 - 10% of her  current body weight will have the most impact on her diabetes management.  CDE Consult will be initiated . Exercise, and detailed carbohydrates information provided  -  detailed on discharge instructions.  5) hypothyroidism: -For new diagnosis of hypothyroidism, during her last visit, she was initiated on levothyroxine 50 mcg p.o. every morning.  - We discussed about the correct intake of her thyroid hormone, on empty stomach at fasting, with water, separated by at least 30 minutes from breakfast and other medications,  and separated by more than 4 hours from calcium, iron, multivitamins, acid reflux medications (PPIs). -Patient is made aware of the fact that thyroid hormone replacement is needed for life, dose to be adjusted by periodic monitoring of thyroid function tests.  6) Chronic Care/Health Maintenance:  -she  is on Statin medications and  is encouraged to initiate and continue to follow up with Ophthalmology, Dentist,  Podiatrist at least yearly or according to recommendations, and advised to  stay away from smoking. I have recommended yearly flu vaccine and pneumonia vaccine at least every 5 years; moderate intensity exercise for up to 150 minutes weekly; and  sleep for at least 7 hours a day.  - she is  advised to maintain close follow up with Fayrene Helper, MD for primary care needs, as well as her other providers for optimal and coordinated care.   - Patient Care Time Today:  25 min, of which >50% was spent in reviewing her  current and  previous  labs/studies, blood glucose readings, previous treatments, and medications doses and developing a plan for long-term care based on the latest recommendations for standards of care.  [place] participated in the discussions, expressed understanding, and voiced agreement with the above plans.  All questions were answered to her satisfaction. she is encouraged to contact clinic should she have any questions or concerns prior to her return visit.   Follow up plan: - Return in about 3 months (around 12/21/2018) for Follow up with Pre-visit Labs, Meter, and Logs.  Glade Lloyd, MD [facility] [address] [place], [place] [address] Phone: [phone]  Fax: [phone]    09/20/2018,  10:23 AM  This note was partially dictated with voice recognition software. Similar sounding words can be transcribed inadequately or may not  be corrected upon review.

## 2018-09-30 ENCOUNTER — Telehealth: Payer: Self-pay | Admitting: Neurology

## 2018-09-30 DIAGNOSIS — G4733 Obstructive sleep apnea (adult) (pediatric): Secondary | ICD-10-CM

## 2018-09-30 NOTE — Telephone Encounter (Signed)
Order for cpap supplies placed. Order faxed to [facility]. Received a receipt of confirmation.

## 2018-09-30 NOTE — Addendum Note (Signed)
Addended by: Lester [place] A on: 09/30/2018 08:28 AM   Modules accepted: Orders

## 2018-09-30 NOTE — Telephone Encounter (Signed)
Pt left a message asking that a new rx be faxed over to [facility] in [place]. The pt stated that [place] is telling the pt she needs a new rx to get supplies. Pt last seen Dr. Rexene Alberts on 11/14/17. Pt is scheduled for her one year follow up on 11/20/18 with Ward Givens, NP.

## 2018-10-03 ENCOUNTER — Encounter: Payer: Medicare Other | Attending: Family Medicine | Admitting: Nutrition

## 2018-10-03 ENCOUNTER — Other Ambulatory Visit: Payer: Self-pay | Admitting: Family Medicine

## 2018-10-03 ENCOUNTER — Other Ambulatory Visit: Payer: Self-pay

## 2018-10-03 VITALS — Ht 64.0 in | Wt 300.0 lb

## 2018-10-03 DIAGNOSIS — IMO0002 Reserved for concepts with insufficient information to code with codable children: Secondary | ICD-10-CM

## 2018-10-03 DIAGNOSIS — E1165 Type 2 diabetes mellitus with hyperglycemia: Secondary | ICD-10-CM

## 2018-10-03 DIAGNOSIS — E118 Type 2 diabetes mellitus with unspecified complications: Secondary | ICD-10-CM | POA: Insufficient documentation

## 2018-10-03 DIAGNOSIS — Z6841 Body Mass Index (BMI) 40.0 and over, adult: Secondary | ICD-10-CM | POA: Insufficient documentation

## 2018-10-03 NOTE — Progress Notes (Signed)
Telelphone visit Medical Nutrition Therapy:  Appt start time: 1000 end time: 1015 Follow up Assessment:  Primary concerns today:  [place] Obesity and Dm Type 2. Possible fatty liver with elevated liver enzymes. BMI 53  Sees PCP Dr. Moshe Cipro FBS 121.  Last night 119. BS's are much better. Gained 2 lbs which may be to better blood sugars and hydration. Has worked on eating much better. Eating three meals per day, more fruits and vegetables and choosing healthier snacks of fruit. Drinking water and some diet soda. Willing to get work on cutting out diet sodas. Wants to lose weight. Will start walking some in the house . Metformin 1000 mg  BID Glipizide XL 10 mg daily.. Taking Lantus 30 units a day now and BS are much improved. May benefit from gastric bypass surgery for needed weight loss.  Wt Readings from Last 3 Encounters:  10/03/18 300 lb (136.1 kg)  09/05/18 (!) 311 lb (141.1 kg)  09/04/18 (!) 309 lb 6.4 oz (140.3 kg)   Ht Readings from Last 3 Encounters:  10/03/18 5\' 4"  (1.626 m)  09/05/18 5\' 4"  (1.626 m)  09/04/18 5\' 4"  (1.626 m)   Body mass index is 51.49 kg/m. @BMIFA @ Facility age limit for growth percentiles is 20 years. Facility age limit for growth percentiles is 20 years.  Lab Results  Component Value Date   HGBA1C >14.0 (H) 08/28/2018   CMP Latest Ref Rng & Units 08/28/2018 05/22/2018 05/09/2018  Glucose 65 - 99 mg/dL 393(H) 152(H) 152(H)  BUN 7 - 25 mg/dL 17 24 8   Creatinine 0.50 - 1.05 mg/dL 0.90 1.17(H) 0.81  Sodium 135 - 146 mmol/L 137 137 136  Potassium 3.5 - 5.3 mmol/L 4.6 4.5 4.1  Chloride 98 - 110 mmol/L 98 101 101  CO2 20 - 32 mmol/L 29 27 28   Calcium 8.6 - 10.4 mg/dL 9.6 9.6 8.7(L)  Total Protein 6.1 - 8.1 g/dL 7.6 - -  Total Bilirubin 0.2 - 1.2 mg/dL 0.5 - -  Alkaline Phos 38 - 126 U/L - - -  AST 10 - 35 U/L 37(H) - -  ALT 6 - 29 U/L 51(H) - -   Preferred Learning Style:   No preference indicated   Learning Readiness:   Ready  Change in  progress   MEDICATIONS:    DIETARY INTAKE:  24-hr recall:  B ( AM): Coffee with cream and splenda, egg and 1 slice toast. Snk ( AM): 2 apples L ( PM): ham lettuce and tomato sandwich, water Snk ( PM): peaches D ( PM):  Baked chicken, string beans, broccoli, Diet soda Snk ( PM): Popcorn or water, orange Beverages: water and diet soda  Usual physical activity: ADL  Estimated energy needs: 1200  calories 135 g carbohydrates 90 g protein 33 g fat  Progress Towards Goal(s):  In progress.   Nutritional Diagnosis:  NB-1.1 Food and nutrition-related knowledge deficit As related to Diabetes.  As evidenced by A1C >14%    Intervention:  Nutrition and Diabetes education provided on My Plate, CHO counting, meal planning, portion sizes, timing of meals, avoiding snacks between meals unless having a low blood sugar, target ranges for A1C and blood sugars, signs/symptoms and treatment of hyper/hypoglycemia, monitoring blood sugars, taking medications as prescribed, benefits of exercising 30 minutes per day and prevention of complications of DM.   Goal 1. Eat by 9 am 2. Walk 15 minutes a day 3. Increase vegetables with lunch and dinner 4. Keep drinking water only and avoid diet sodas  Lose 1 lb per week   Teaching Method Utilized:  Visual Auditory Hands on  Handouts given during visit include:  The Plate Method   Meal Plan Card   Barriers to learning/adherence to lifestyle change: none  Demonstrated degree of understanding via:  Teach Back   Monitoring/Evaluation:  Dietary intake, exercise, meal planning, and body weight in 1 month(s). Would benefit from bariatric surgery for much needed weight loss. She would benefit from [place] when liver enzymes return to normal.

## 2018-10-08 ENCOUNTER — Encounter: Payer: Self-pay | Admitting: Nutrition

## 2018-10-08 NOTE — Patient Instructions (Signed)
  Goal 1. Eat by 9 am 2. Walk 15 minutes a day 3. Increase vegetables with lunch and dinner 4. Keep drinking water only and avoid diet sodas Lose 1 lb per week

## 2018-10-15 ENCOUNTER — Other Ambulatory Visit: Payer: Self-pay | Admitting: Family Medicine

## 2018-10-15 DIAGNOSIS — M62830 Muscle spasm of back: Secondary | ICD-10-CM

## 2018-10-23 ENCOUNTER — Telehealth: Payer: Self-pay | Admitting: Internal Medicine

## 2018-10-23 NOTE — Telephone Encounter (Signed)
Let's set her up for [facility] for a hypertension visit so they can discuss further. Happy to see her after that if needed. Thanks, GA

## 2018-10-23 NOTE — Telephone Encounter (Signed)
Left message for the pt, the pharmacist will call and schedule appointment with her.

## 2018-10-23 NOTE — Telephone Encounter (Signed)
Spoke with pt, since starting the hydralazine she has had blood sugar readings in the 600 and her PCP wants to put her on more insulin. Also she has swelling in her feet and ankles. She denies SOB just does not like the swelling. She would like to change to something different that will not affect her blood sugar and swelling. Will forward to dr Margaretann Loveless to review and advise.

## 2018-10-23 NOTE — Telephone Encounter (Signed)
  Pt c/o medication issue:  1. Name of Medication: hydrALAZINE (APRESOLINE) 25 MG tablet  2. How are you currently taking this medication (dosage and times per day)? As directed  3. Are you having a reaction (difficulty breathing--STAT)?  No  4. What is your medication issue? This medication was added at last visit and now she is having issues with her blood sugar. She said her sugar levels have gone up in the 600's since starting this medicine. She also states that her ankles and feet have started to swell really bad. Please advise.

## 2018-10-24 NOTE — Telephone Encounter (Signed)
Patient stopped hydralazine for 10 days and now BG at norrnal range.  Stopped carvedilol 12.5mg  due to increase diuresis   Currently on:  Clonidine 0.2 patch  Clonidine 0.3mg  at HS  Amlodipine 10mg  daily  Recommendation:  Stop metoprolol  Stop carvedilol  Stop hydralazine   Continue all other medication as prescribed  Monitor BP twice daily and bring records to [facility] on 7/16

## 2018-11-05 ENCOUNTER — Other Ambulatory Visit: Payer: Self-pay

## 2018-11-05 ENCOUNTER — Ambulatory Visit (INDEPENDENT_AMBULATORY_CARE_PROVIDER_SITE_OTHER): Payer: Medicare Other | Admitting: Family Medicine

## 2018-11-05 ENCOUNTER — Encounter: Payer: Self-pay | Admitting: Family Medicine

## 2018-11-05 VITALS — BP 110/70 | HR 96 | Temp 97.7°F | Resp 14 | Ht 64.0 in | Wt 326.0 lb

## 2018-11-05 DIAGNOSIS — J208 Acute bronchitis due to other specified organisms: Secondary | ICD-10-CM | POA: Diagnosis not present

## 2018-11-05 DIAGNOSIS — E1159 Type 2 diabetes mellitus with other circulatory complications: Secondary | ICD-10-CM

## 2018-11-05 DIAGNOSIS — Z1231 Encounter for screening mammogram for malignant neoplasm of breast: Secondary | ICD-10-CM

## 2018-11-05 DIAGNOSIS — M5440 Lumbago with sciatica, unspecified side: Secondary | ICD-10-CM

## 2018-11-05 DIAGNOSIS — R52 Pain, unspecified: Secondary | ICD-10-CM

## 2018-11-05 DIAGNOSIS — I10 Essential (primary) hypertension: Secondary | ICD-10-CM

## 2018-11-05 MED ORDER — AMOXICILLIN-POT CLAVULANATE 875-125 MG PO TABS: 1.0000 | ORAL_TABLET | Freq: Two times a day (BID) | ORAL | 0 refills | Status: DC

## 2018-11-05 MED ORDER — KETOROLAC TROMETHAMINE 60 MG/2ML IM SOLN
60.0000 mg | Freq: Once | INTRAMUSCULAR | Status: AC
  Administered 2018-11-05: 60 mg via INTRAMUSCULAR

## 2018-11-05 MED ORDER — FLUCONAZOLE 150 MG PO TABS: ORAL_TABLET | ORAL | 0 refills | Status: DC

## 2018-11-05 MED ORDER — BENZONATATE 100 MG PO CAPS: 100.0000 mg | ORAL_CAPSULE | Freq: Three times a day (TID) | ORAL | 0 refills | Status: DC | PRN

## 2018-11-05 NOTE — Assessment & Plan Note (Signed)
Controlled, no change in medication DASH diet and commitment to daily physical activity for a minimum of 30 minutes discussed and encouraged, as a part of hypertension management. The importance of attaining a healthy weight is also discussed.  BP/Weight 11/05/2018 10/03/2018 09/05/2018 09/04/2018 08/26/2018 0/16/5800 10/02/4947  Systolic BP 447 - 395 844 171 278 718  Diastolic BP 70 - 90 84 99 101 86  Wt. (Lbs) 326.04 300 311 309.4 314 - 314  BMI 55.96 51.49 53.38 53.11 53.9 53.9 53.9

## 2018-11-05 NOTE — Patient Instructions (Addendum)
F/u in 12 weeks, call if you need me sooner  Please get mammogram scheduled at checkout  Three medications sent in for your bronchitis  Blood pressure excellent  Toradol injection today for back pain and  Chronic pain medication is also being prescribed as before  Thanks for choosing [facility], we consider it a privelige to serve you.   Social distancing. Frequent hand washing with soap and water Keeping your hands off of your face. These 3 practices will help to keep both you and your community healthy during this time. Please practice them faithfully!

## 2018-11-05 NOTE — Progress Notes (Signed)
Angela [name]     MRN: [id number]      DOB: 11-05-1960   HPI Angela [name] is here for follow up and re-evaluation of chronic medical conditions, medication management and review of any available recent lab and radiology data.  Preventive health is updated, specifically  Cancer screening and Immunization.   Questions or concerns regarding consultations or procedures which the PT has had in the interim are  Addressed.Reports marked improvement in blood sugar with excellent results The PT denies any adverse reactions to current medications since the last visit.  1 week h/o chest congestion, increased wheezing and cough productive of yellow green sputum, intermittent chills  C/oincreased and uncontrolled back pain x 3 days, no specific injury triggered this, no red flags   ROS Denies recent fever has had  chills. Denies sinus pressure, nasal congestion, ear pain or sore throat.  Denies chest pains, palpitations and leg swelling Denies abdominal pain, nausea, vomiting,diarrhea or constipation.   Denies dysuria, frequency, hesitancy or incontinence.  Denies headaches, seizures,chronic lower ext  numbness, and  tingling. Denies uncontrolled depression, anxiety or insomnia. Denies skin break down or rash.   PE  BP 110/70   Pulse 96   Temp 97.7 F (36.5 C) (Oral)   Resp 14   Ht 5\' 4"  (1.626 m)   Wt (!) 326 lb 0.6 oz (147.9 kg)   SpO2 93%   BMI 55.96 kg/m   Patient alert and oriented and in no cardiopulmonary distress.Pt in pain HEENT: No facial asymmetry, EOMI,   oropharynx pink and moist.  Neck supple no JVD, no mass.  Chest: decreased air entry throughout , scattered wheezes and crackles  CVS: S1, S2 no murmurs, no S3.Regular rate.  ABD: Soft non tender.   Ext: No edema  MS: decreased  ROM spine, shoulders, hips and knees.  Skin: Intact, no ulcerations or rash noted.  Psych: Good eye contact, normal affect. Memory intact not anxious or depressed appearing.  CNS: CN 2-12  intact, power,  normal throughout.no focal deficits noted.   Assessment & Plan  Back pain Inmcreased nd uncontrolled pain toradol 60 mg iM in office  Essential hypertension, benign Controlled, no change in medication DASH diet and commitment to daily physical activity for a minimum of 30 minutes discussed and encouraged, as a part of hypertension management. The importance of attaining a healthy weight is also discussed.  BP/Weight 11/05/2018 10/03/2018 09/05/2018 09/04/2018 08/26/2018 7/82/9562 05/04/863  Systolic BP 784 - 696 295 284 132 440  Diastolic BP 70 - 90 84 99 101 86  Wt. (Lbs) 326.04 300 311 309.4 314 - 314  BMI 55.96 51.49 53.38 53.11 53.9 53.9 53.9       Acute bronchitis Decongestant, antibioitc and fluconazole for as needed use prescribed  Encounter for pain management The patient's Controlled Substance registry is reviewed and compliance confirmed. Adequacy of  Pain control and level of function is assessed. Medication dosing is adjusted as deemed appropriate. Twelve weeks of medication is prescribed , patient signs for the script and is provided with a follow up appointment between 11 to 12 weeks .   Type 2 diabetes mellitus with vascular disease ([place]) Followed by Endo and reports marked improvement , will follow along Angela [name] is reminded of the importance of commitment to daily physical activity for 30 minutes or more, as able and the need to limit carbohydrate intake to 30 to 60 grams per meal to help with blood sugar control.   The need to  take medication as prescribed, test blood sugar as directed, and to call between visits if there is a concern that blood sugar is uncontrolled is also discussed.   Angela [name] is reminded of the importance of daily foot exam, annual eye examination, and good blood sugar, blood pressure and cholesterol control.  Diabetic Labs Latest Ref Rng & Units 08/28/2018 05/22/2018 05/09/2018 05/08/2018 05/06/2018  HbA1c <5.7 % of total Hgb >14.0(H)  - - - -  Microalbumin mg/dL - - - - -  Micro/Creat Ratio <30 mcg/mg creat - - - - -  Chol <200 mg/dL 90 - - - 174  HDL > OR = 50 mg/dL 31(L) - - - 49  Calc LDL mg/dL (calc) 33 - - - 109(H)  Triglycerides <150 mg/dL 179(H) - - - 80  Creatinine 0.50 - 1.05 mg/dL 0.90 1.17(H) 0.81 0.80 0.85   BP/Weight 11/05/2018 10/03/2018 09/05/2018 09/04/2018 08/26/2018 9/32/3557 07/01/2023  Systolic BP 427 - 062 376 283 151 761  Diastolic BP 70 - 90 84 99 101 86  Wt. (Lbs) 326.04 300 311 309.4 314 - 314  BMI 55.96 51.49 53.38 53.11 53.9 53.9 53.9   Foot/eye exam completion dates Latest Ref Rng & Units 02/07/2018 11/06/2016  Eye Exam No Retinopathy - -  Foot Form Completion - Done Done        Morbid obesity ([place])  Patient re-educated about  the importance of commitment to a  minimum of 150 minutes of exercise per week as able.  The importance of healthy food choices with portion control discussed, as well as eating regularly and within a 12 hour window most days. The need to choose "clean , green" food 50 to 75% of the time is discussed, as well as to make water the primary drink and set a goal of 64 ounces water daily.    Weight /BMI 11/05/2018 10/03/2018 09/05/2018  WEIGHT 326 lb 0.6 oz 300 lb 311 lb  HEIGHT 5\' 4"  5\' 4"  5\' 4"   BMI 55.96 kg/m2 51.49 kg/m2 53.38 kg/m2

## 2018-11-05 NOTE — Assessment & Plan Note (Signed)
Inmcreased nd uncontrolled pain toradol 60 mg iM in office

## 2018-11-07 ENCOUNTER — Telehealth: Payer: Self-pay

## 2018-11-07 NOTE — Telephone Encounter (Signed)
lmom for prescreen  

## 2018-11-09 ENCOUNTER — Encounter: Payer: Self-pay | Admitting: Family Medicine

## 2018-11-09 MED ORDER — OXYCODONE HCL ER 40 MG PO T12A: EXTENDED_RELEASE_TABLET | ORAL | 0 refills | Status: AC

## 2018-11-09 MED ORDER — OXYCODONE HCL ER 40 MG PO T12A: EXTENDED_RELEASE_TABLET | ORAL | 0 refills | Status: DC

## 2018-11-09 NOTE — Assessment & Plan Note (Signed)
  Patient re-educated about  the importance of commitment to a  minimum of 150 minutes of exercise per week as able.  The importance of healthy food choices with portion control discussed, as well as eating regularly and within a 12 hour window most days. The need to choose "clean , green" food 50 to 75% of the time is discussed, as well as to make water the primary drink and set a goal of 64 ounces water daily.    Weight /BMI 11/05/2018 10/03/2018 09/05/2018  WEIGHT 326 lb 0.6 oz 300 lb 311 lb  HEIGHT 5\' 4"  5\' 4"  5\' 4"   BMI 55.96 kg/m2 51.49 kg/m2 53.38 kg/m2

## 2018-11-09 NOTE — Assessment & Plan Note (Signed)
Decongestant, antibioitc and fluconazole for as needed use prescribed

## 2018-11-09 NOTE — Assessment & Plan Note (Signed)
The patient's Controlled Substance registry is reviewed and compliance confirmed. Adequacy of  Pain control and level of function is assessed. Medication dosing is adjusted as deemed appropriate. Twelve weeks of medication is prescribed , patient signs for the script and is provided with a follow up appointment between 11 to 12 weeks .  

## 2018-11-09 NOTE — Assessment & Plan Note (Signed)
Followed by Endo and reports marked improvement , will follow along Angela [name] is reminded of the importance of commitment to daily physical activity for 30 minutes or more, as able and the need to limit carbohydrate intake to 30 to 60 grams per meal to help with blood sugar control.   The need to take medication as prescribed, test blood sugar as directed, and to call between visits if there is a concern that blood sugar is uncontrolled is also discussed.   Angela [name] is reminded of the importance of daily foot exam, annual eye examination, and good blood sugar, blood pressure and cholesterol control.  Diabetic Labs Latest Ref Rng & Units 08/28/2018 05/22/2018 05/09/2018 05/08/2018 05/06/2018  HbA1c <5.7 % of total Hgb >14.0(H) - - - -  Microalbumin mg/dL - - - - -  Micro/Creat Ratio <30 mcg/mg creat - - - - -  Chol <200 mg/dL 90 - - - 174  HDL > OR = 50 mg/dL 31(L) - - - 49  Calc LDL mg/dL (calc) 33 - - - 109(H)  Triglycerides <150 mg/dL 179(H) - - - 80  Creatinine 0.50 - 1.05 mg/dL 0.90 1.17(H) 0.81 0.80 0.85   BP/Weight 11/05/2018 10/03/2018 09/05/2018 09/04/2018 08/26/2018 2/95/2841 07/01/4399  Systolic BP 027 - 253 664 403 474 259  Diastolic BP 70 - 90 84 99 101 86  Wt. (Lbs) 326.04 300 311 309.4 314 - 314  BMI 55.96 51.49 53.38 53.11 53.9 53.9 53.9   Foot/eye exam completion dates Latest Ref Rng & Units 02/07/2018 11/06/2016  Eye Exam No Retinopathy - -  Foot Form Completion - Done Done

## 2018-11-14 ENCOUNTER — Ambulatory Visit: Payer: Medicare Other

## 2018-11-14 ENCOUNTER — Ambulatory Visit (HOSPITAL_COMMUNITY): Payer: Medicare Other

## 2018-11-14 NOTE — Progress Notes (Deleted)
[name]                 DOB: Sep 20, 1960                      MRN: [id number]     HPI: Angela [name] is a 58 y.o. female referred by Dr. Margaretann Loveless to HTN clinic. PMH includes HTN, uncontrolled DM-II, hyperlipidemia, hypertension, migraine, sleep apnea  Current HTN meds:   Clonidine 0.50m patch  Clonidine 0.379mqHS  Amlodipine 1061maily  Previously tried:   Carvedilol 12.5mg66mstopped d/y increased diuresis  Hydralazine - stopped d/t uncontrolled blood glucose  BP goal: <130/80  Family History: The patient's family history includes Asthma in her mother; Cancer in her father and mother; Colon cancer in her father; Heart attack in her father; Heart disease in her mother; Hypertension in her mother. There is no history of Colon polyps.  Social History:   Diet:   Exercise:   Home BP readings:   Wt Readings from Last 3 Encounters:  11/05/18 (!) 326 lb 0.6 oz (147.9 kg)  10/03/18 300 lb (136.1 kg)  09/05/18 (!) 311 lb (141.1 kg)   BP Readings from Last 3 Encounters:  11/05/18 110/70  09/05/18 (!) 145/90  09/04/18 128/84   Pulse Readings from Last 3 Encounters:  11/05/18 96  09/05/18 68  09/04/18 75    Renal function: CrCl cannot be calculated (Patient's most recent lab result is older than the maximum 21 days allowed.).  Past Medical History:  Diagnosis Date  . Anxiety   . Arthritis   . Asthma   . Coronary atherosclerosis of native coronary artery    Mild nonobstructive CAD by cardiac catheterization 2008 - Dr. HarwTerrence DupontDegenerative disc disease   . Depression   . Dysrhythmia   . Essential hypertension, benign   . Folliculitis 9/223/66/4403GERD (gastroesophageal reflux disease)   . H/O hiatal hernia   . Helicobacter pylori gastritis 2008  . Hypercholesterolemia   . Hypertension   . Hyperthyroidism    s/p radiation  . Left-sided epistaxis 05/15/2018  . Low back pain   . Migraine   . Nephrolithiasis    Recurring episodes since 2004  .  Severe obesity (BMI >= 40) (HCC)   . Sleep apnea    STOP BANG SCORE 6no cpap used  . [facility]  Current Outpatient Medications on File Prior to Visit  Medication Sig Dispense Refill  . albuterol (PROAIR HFA) 108 (90 Base) MCG/ACT inhaler INHALE 2 PUFFS BY MOUTH EVERY 6-8HRS AS NEEDED (Patient taking differently: Inhale 2 puffs into the lungs every 6 (six) hours as needed for wheezing or shortness of breath. ) 8.5 Inhaler 1  . amLODipine (NORVASC) 10 MG tablet Take 1 tablet (10 mg total) by mouth daily. 90 tablet 3  . amoxicillin-clavulanate (AUGMENTIN) 875-125 MG tablet Take 1 tablet by mouth 2 (two) times daily. 14 tablet 0  . benzonatate (TESSALON PERLES) 100 MG capsule Take 1 capsule (100 mg total) by mouth 3 (three) times daily as needed for cough. 30 capsule 0  . blood glucose meter kit and supplies Dispense based on patient and insurance preference. Once daily testing dx. e11.9 1 each 0  . cloNIDine (CATAPRES - DOSED IN MG/24 HR) 0.3 mg/24hr patch APPLY 1 PATCH TO SKIN ONCE A WEEK 12 patch 0  . cloNIDine (CATAPRES) 0.3 MG tablet Take 1 tablet (0.3  mg total) by mouth at bedtime. 30 tablet 3  . cyclobenzaprine (FLEXERIL) 10 MG tablet TAKE 1 TABLET BY MOUTH THREE TIMES A DAY 90 tablet 1  . diazepam (VALIUM) 5 MG tablet Take one tablet by mouth three times daily for muscle spasm 90 tablet 5  . DULoxetine (CYMBALTA) 60 MG capsule TAKE 1 CAPSULE BY MOUTH TWICE A DAY (Patient taking differently: Take 60 mg by mouth 2 (two) times daily. ) 180 capsule 0  . ergocalciferol (VITAMIN D2) 50000 units capsule Take 1 capsule (50,000 Units total) by mouth once a week. One capsule once weekly 12 capsule 1  . esomeprazole (NEXIUM) 40 MG capsule Take 1 capsule (40 mg total) by mouth daily. 90 capsule 3  . fluconazole (DIFLUCAN) 150 MG tablet T^ake one tablet I once daily as needed, for vaginal itch associated wit antibiotic use 2 tablet 0  . fluticasone  (FLONASE) 50 MCG/ACT nasal spray Place 2 sprays into both nostrils daily. (Patient taking differently: Place 2 sprays into both nostrils daily as needed for allergies. ) 16 g 6  . gabapentin (NEURONTIN) 800 MG tablet TAKE 1 TABLET BY MOUTH 4 TIMES A DAY 360 tablet 1  . glipiZIDE (GLUCOTROL XL) 10 MG 24 hr tablet Take 1 tablet (10 mg total) by mouth daily with breakfast. Take two tablets every morning with breakfast 60 tablet 5  . glucose blood test strip Use as instructed four times daily testing dx e11.65 150 each 5  . hydrOXYzine (ATARAX/VISTARIL) 10 MG tablet Take 10-30 mg by mouth at bedtime.   2  . Insulin Glargine (LANTUS SOLOSTAR) 100 UNIT/ML Solostar Pen Inject 30 Units into the skin daily. 5 pen 2  . Insulin Pen Needle (B-D ULTRAFINE III SHORT PEN) 31G X 8 MM MISC 1 each by Does not apply route as directed. 100 each 3  . ipratropium-albuterol (DUONEB) 0.5-2.5 (3) MG/3ML SOLN Take 3 mLs by nebulization every 6 (six) hours as needed. (Patient taking differently: Take 3 mLs by nebulization every 6 (six) hours as needed (SOB). ) 360 mL 1  . KLOR-CON M20 20 MEQ tablet TAKE 1 TABLET (20 MEQ TOTAL) BY MOUTH 2 (TWO) TIMES DAILY. 180 tablet 1  . levothyroxine (SYNTHROID) 50 MCG tablet Take 1 tablet (50 mcg total) by mouth daily before breakfast. 30 tablet 2  . loratadine (CLARITIN) 10 MG tablet Take 1 tablet (10 mg total) by mouth daily. 30 tablet 11  . meclizine (ANTIVERT) 25 MG tablet Take 25 mg by mouth 3 (three) times daily as needed for dizziness.    . metFORMIN (GLUCOPHAGE) 1000 MG tablet Take 1 tablet (1,000 mg total) by mouth 2 (two) times daily. 60 tablet 3  . montelukast (SINGULAIR) 10 MG tablet TAKE 1 TABLET (10 MG TOTAL) BY MOUTH AT BEDTIME. 90 tablet 1  . nitroGLYCERIN (NITROSTAT) 0.4 MG SL tablet Place 1 tablet (0.4 mg total) under the tongue every 5 (five) minutes as needed for chest pain. 30 tablet 0  . ondansetron (ZOFRAN) 4 MG tablet Take 1 tablet (4 mg total) by mouth every 8  (eight) hours as needed for nausea or vomiting. 20 tablet 0  . oxyCODONE (OXYCONTIN) 40 mg 12 hr tablet Take 1 tablet (40 mg total) by mouth every 12 (twelve) hours for 30 days. 60 tablet 0  . oxyCODONE (OXYCONTIN) 40 mg 12 hr tablet Take one tablet by mouth two times daily for chronic back pain 60 tablet 0  . [START ON 12/14/2018] oxyCODONE (OXYCONTIN) 40 mg 12 hr  tablet Take one tablet by mouth two times daily for chronic back pain 60 tablet 0  . [START ON 01/13/2019] oxyCODONE (OXYCONTIN) 40 mg 12 hr tablet Take one  tablet by mouth two times daily for chronic back pain 60 tablet 0  . rosuvastatin (CRESTOR) 40 MG tablet TAKE 1 TABLET BY MOUTH EVERY DAY 90 tablet 1  . SYMBICORT 160-4.5 MCG/ACT inhaler TAKE 2 PUFFS BY MOUTH TWICE A DAY 30.6 Inhaler 1  . zolpidem (AMBIEN) 10 MG tablet Take 1 tablet (10 mg total) by mouth at bedtime. 30 tablet 5  . [DISCONTINUED] losartan-hydrochlorothiazide (HYZAAR) 50-12.5 MG per tablet TAKE 1 TABLET BY MOUTH ONCE DAILY *REPLACES DIOVAN/HCTZ* 30 tablet 0   No current facility-administered medications on file prior to visit.     Allergies  Allergen Reactions  . Morphine Other (See Comments)    Reaction with esophagus, unable to swallow.   . Ace Inhibitors Cough  . Aspirin Other (See Comments)    Reaction with esophagus, unable to swallow.   . Losartan Cough  . Tapentadol Other (See Comments)    Nausea, increased sleepiness, h/a  . Tomato Other (See Comments)    Acid reflux due to acid in tomato  . Adhesive [Tape] Rash    There were no vitals taken for this visit.  No problem-specific Assessment & Plan notes found for this encounter.  Hopie Pellegrin Rodriguez-Guzman PharmD, BCPS, [place] [id number] 11/14/2018 2:09 PM

## 2018-11-18 ENCOUNTER — Other Ambulatory Visit: Payer: Self-pay | Admitting: Family Medicine

## 2018-11-20 ENCOUNTER — Ambulatory Visit: Payer: Medicare Other | Admitting: Adult Health

## 2018-11-21 ENCOUNTER — Other Ambulatory Visit: Payer: Self-pay | Admitting: Family Medicine

## 2018-11-22 ENCOUNTER — Other Ambulatory Visit: Payer: Self-pay | Admitting: Family Medicine

## 2018-11-28 ENCOUNTER — Other Ambulatory Visit: Payer: Self-pay | Admitting: Family Medicine

## 2018-12-05 ENCOUNTER — Other Ambulatory Visit: Payer: Self-pay | Admitting: "Endocrinology

## 2018-12-06 ENCOUNTER — Other Ambulatory Visit: Payer: Self-pay | Admitting: Family Medicine

## 2018-12-18 ENCOUNTER — Telehealth: Payer: Self-pay | Admitting: Adult Health

## 2018-12-18 ENCOUNTER — Other Ambulatory Visit: Payer: Self-pay | Admitting: Family Medicine

## 2018-12-18 DIAGNOSIS — M62830 Muscle spasm of back: Secondary | ICD-10-CM

## 2018-12-19 ENCOUNTER — Encounter: Payer: Self-pay | Admitting: Adult Health

## 2018-12-19 ENCOUNTER — Telehealth (INDEPENDENT_AMBULATORY_CARE_PROVIDER_SITE_OTHER): Payer: Medicare Other | Admitting: Adult Health

## 2018-12-19 ENCOUNTER — Telehealth: Payer: Self-pay | Admitting: Women's Health

## 2018-12-19 ENCOUNTER — Other Ambulatory Visit: Payer: Self-pay

## 2018-12-19 VITALS — BP 157/86 | HR 84 | Ht 64.0 in | Wt 310.0 lb

## 2018-12-19 DIAGNOSIS — I2510 Atherosclerotic heart disease of native coronary artery without angina pectoris: Secondary | ICD-10-CM

## 2018-12-19 DIAGNOSIS — I10 Essential (primary) hypertension: Secondary | ICD-10-CM

## 2018-12-19 DIAGNOSIS — I119 Hypertensive heart disease without heart failure: Secondary | ICD-10-CM

## 2018-12-19 DIAGNOSIS — E1159 Type 2 diabetes mellitus with other circulatory complications: Secondary | ICD-10-CM | POA: Diagnosis not present

## 2018-12-19 DIAGNOSIS — E1165 Type 2 diabetes mellitus with hyperglycemia: Secondary | ICD-10-CM | POA: Diagnosis not present

## 2018-12-19 MED ORDER — METOPROLOL TARTRATE 25 MG PO TABS: 25.0000 mg | ORAL_TABLET | Freq: Two times a day (BID) | ORAL | 3 refills | Status: DC

## 2018-12-19 NOTE — Telephone Encounter (Signed)
Error

## 2018-12-19 NOTE — Patient Instructions (Signed)
Medication Instructions:  START- Metoprolol 25 mg twice a day  If you need a refill on your cardiac medications before your next appointment, please call your pharmacy.  Labwork: None Ordered   Testing/Procedures: None Ordered  Follow-Up: . Your physician recommends that you schedule a follow-up appointment in: September 24th @ 8:30 am   At [facility], you and your health needs are our priority.  As part of our continuing mission to provide you with exceptional heart care, we have created designated Provider Care Teams.  These Care Teams include your primary Cardiologist (physician) and Advanced Practice Providers (APPs -  Physician Assistants and Nurse Practitioners) who all work together to provide you with the care you need, when you need it.  Thank you for choosing [facility] at [facility]!!

## 2018-12-19 NOTE — Progress Notes (Signed)
Telehealth-tele-phone visit Patient has given verbal permission to conduct this visit via virtual appointment and to bill insurance 12/19/2018 10:30 am     Date:  12/19/2018   ID:  Angela [name], DOB 11-10-1960, MRN [id number]  Patient location: Home Provider location: Home office  PCP:  Angela Helper, MD  Cardiologist:  Angela Munroe, MD  Electrophysiologist:  None   Evaluation Performed: Follow-up telehealth visit  Chief Complaint:  Hypertension   History of Present Illness:    Angela [name] is a 58 y.o. female with longstanding history of hypertension, type 2 diabetes, and hyperlipidemia.  She had a cardiac catheterization on 05/08/2018 which revealed proximal to mid LAD lesion of 10%, and mid LAD lesion of 20%, no significant coronary obstructive disease elsewhere.    She was added onto my schedule today due to issues concerning carvedilol.  She was last seen by Angela [name] for initial evaluation of blood pressure control with history of unstable angina while hospitalized in January 2020.  The patient blood pressure was not well controlled and therefore metoprolol was discontinued and she was started on carvedilol.  Angela [name], noted that her blood glucose began rising extremely high sometimes up to greater than 400.  She had made no changes in her eating habits and continued her diabetic medication as directed.  She stopped taking the carvedilol and noticed her blood sugars returned to a more normal level.  She was seen by endocrinology who recommended that she be seen again by cardiology to adjust her medications as she was not tolerating carvedilol.  She comes today with an elevated blood pressure of 157/86.  She states that throughout the day it becomes more elevated.  She denies chest pain, continues to have some mild shortness of breath with exertion, but does not have any dizziness presyncope or lightheadedness.    The patient does not have symptoms concerning for  COVID-19 infection (fever, chills, cough, or new shortness of breath).    Past Medical History:  Diagnosis Date   Anxiety    Arthritis    Asthma    Coronary atherosclerosis of native coronary artery    Mild nonobstructive CAD by cardiac catheterization 2008 - Dr. Terrence Dupont   Degenerative disc disease    Depression    Dysrhythmia    Essential hypertension, benign    Folliculitis 9/67/5916   GERD (gastroesophageal reflux disease)    H/O hiatal hernia    Helicobacter pylori gastritis 2008   Hypercholesterolemia    Hypertension    Hyperthyroidism    s/p radiation   Left-sided epistaxis 05/15/2018   Low back pain    Migraine    Nephrolithiasis    Recurring episodes since 2004   Severe obesity (BMI >= 40) (HCC)    Sleep apnea    STOP BANG SCORE 6no cpap used   Type 2 diabetes mellitus with diabetic neuropathy ([place])    Past Surgical History:  Procedure Laterality Date   ABDOMINAL HYSTERECTOMY     [facility]  2000 [facility]   INT HEMORRHOIDS/FISSURE   COLONOSCOPY  2003 [facility], CHANGE IN BOWEL HABITS   INT HEMORRHOIDS   COLONOSCOPY  2006 [facility]   INT HEMORRHOIDS   COLONOSCOPY  2007 [facility]   INT HEMORRHOIDS   CYSTOSCOPY WITH STENT PLACEMENT  Left 07/25/2012   Procedure: CYSTOSCOPY, left retograde pyelogram WITH left ureteral  STENT PLACEMENT;  Surgeon: Claybon Jabs, MD;  Location: [facility];  Service: Urology;  Laterality: Left;   CYSTOSCOPY/RETROGRADE/URETEROSCOPY  12/04/2011   Procedure: CYSTOSCOPY/RETROGRADE/URETEROSCOPY;  Surgeon: Molli Hazard, MD;  Location: [facility];  Service: Urology;  Laterality: Right;   CYSTOSCOPY/RETROGRADE/URETEROSCOPY/STONE EXTRACTION WITH BASKET Left 09/03/2012   Procedure: CYSTOSCOPY/RETROGRADE pyelogram/digital URETEROSCOPY/STONE EXTRACTION WITH BASKET, left  stent removal;  Surgeon: Molli Hazard, MD;  Location: [facility];  Service: Urology;  Laterality: Left;   ESOPHAGOGASTRODUODENOSCOPY  2008   PQZ:RAQTM hiatal hernia./Normal esophagus without evidence of Barrett's mass, stricture, erosion or ulceration./Normal duodenal bulb and second portion of the duodenum./Diffuse erythema in the body and the antrum with occasional erosion.  Biopsies obtained via cold forceps to evaluate for H. pylori gastritis   FRACTURE SURGERY  1999   right clavicle   HOLMIUM LASER APPLICATION Left 06/03/6331   Procedure: HOLMIUM LASER APPLICATION;  Surgeon: Molli Hazard, MD;  Location: [facility];  Service: Urology;  Laterality: Left;   KNEE ARTHROSCOPY  10/04/2010,    right knee arthroscopy, dr Theda Sers   LEFT HEART CATH AND CORONARY ANGIOGRAPHY N/A 05/08/2018   Procedure: LEFT HEART CATH AND CORONARY ANGIOGRAPHY;  Surgeon: Troy Sine, MD;  Location: [facility];  Service: Cardiovascular;  Laterality: N/A;   left knee arthroscopic surgery  1999   Left salphingectomy secondary to ectopic pregnancy  [place]   rotary cuff  Right 05/14/2014   [facility]   UPPER GASTROINTESTINAL ENDOSCOPY  2008 abd pain   H. pylori gastritis   UPPER GASTROINTESTINAL ENDOSCOPY  [place]   PUD     Current Meds  Medication Sig   albuterol (PROAIR HFA) 108 (90 Base) MCG/ACT inhaler INHALE 2 PUFFS BY MOUTH EVERY 6-8HRS AS NEEDED (Patient taking differently: Inhale 2 puffs into the lungs every 6 (six) hours as needed for wheezing or shortness of breath. )   amLODipine (NORVASC) 10 MG tablet Take 1 tablet (10 mg total) by mouth daily.   benzonatate (TESSALON PERLES) 100 MG capsule Take 1 capsule (100 mg total) by mouth 3 (three) times daily as needed for cough.   blood glucose meter kit and supplies Dispense based on patient and insurance preference. Once daily testing dx. e11.9   cloNIDine (CATAPRES - DOSED IN MG/24 HR) 0.3  mg/24hr patch APPLY 1 PATCH TO SKIN ONCE A WEEK   cloNIDine (CATAPRES) 0.3 MG tablet TAKE 1 TABLET (0.3 MG TOTAL) BY MOUTH AT BEDTIME.   cyclobenzaprine (FLEXERIL) 10 MG tablet TAKE 1 TABLET BY MOUTH THREE TIMES A DAY   diazepam (VALIUM) 5 MG tablet Take one tablet by mouth three times daily for muscle spasm   DULoxetine (CYMBALTA) 60 MG capsule TAKE 1 CAPSULE BY MOUTH TWICE A DAY (Patient taking differently: Take 60 mg by mouth 2 (two) times daily. )   ergocalciferol (VITAMIN D2) 50000 units capsule Take 1 capsule (50,000 Units total) by mouth once a week. One capsule once weekly   esomeprazole (NEXIUM) 40 MG capsule Take 1 capsule (40 mg total) by mouth daily.   fluconazole (DIFLUCAN) 150 MG tablet T^ake one tablet I once daily as needed, for vaginal itch associated wit antibiotic use   fluticasone (FLONASE) 50 MCG/ACT nasal spray Place 2 sprays into both nostrils daily. (Patient taking differently: Place 2 sprays into both nostrils daily as needed for allergies. )   gabapentin (NEURONTIN) 800  MG tablet TAKE 1 TABLET BY MOUTH FOUR TIMES A DAY   glipiZIDE (GLUCOTROL) 10 MG tablet Take 10 mg by mouth daily before breakfast.   glucose blood test strip Use as instructed four times daily testing dx e11.65   hydrOXYzine (ATARAX/VISTARIL) 10 MG tablet Take 10-30 mg by mouth at bedtime.    Insulin Glargine (LANTUS SOLOSTAR) 100 UNIT/ML Solostar Pen Inject 30 Units into the skin daily.   Insulin Pen Needle (B-D ULTRAFINE III SHORT PEN) 31G X 8 MM MISC 1 each by Does not apply route as directed.   ipratropium-albuterol (DUONEB) 0.5-2.5 (3) MG/3ML SOLN Take 3 mLs by nebulization every 6 (six) hours as needed. (Patient taking differently: Take 3 mLs by nebulization every 6 (six) hours as needed (SOB). )   KLOR-CON M20 20 MEQ tablet TAKE 1 TABLET (20 MEQ TOTAL) BY MOUTH 2 (TWO) TIMES DAILY.   Lancets (ONETOUCH DELICA PLUS [id number]) MISC TEST ONCE DAILY   levothyroxine (SYNTHROID) 50 MCG  tablet TAKE 1 TABLET BY MOUTH DAILY BEFORE BREAKFAST   loratadine (CLARITIN) 10 MG tablet Take 1 tablet (10 mg total) by mouth daily.   meclizine (ANTIVERT) 25 MG tablet Take 25 mg by mouth 3 (three) times daily as needed for dizziness.   metFORMIN (GLUCOPHAGE) 1000 MG tablet TAKE 1 TABLET BY MOUTH TWICE A DAY   montelukast (SINGULAIR) 10 MG tablet TAKE 1 TABLET (10 MG TOTAL) BY MOUTH AT BEDTIME.   nitroGLYCERIN (NITROSTAT) 0.4 MG SL tablet Place 1 tablet (0.4 mg total) under the tongue every 5 (five) minutes as needed for chest pain.   ondansetron (ZOFRAN) 4 MG tablet Take 1 tablet (4 mg total) by mouth every 8 (eight) hours as needed for nausea or vomiting.   oxyCODONE (OXYCONTIN) 40 mg 12 hr tablet Take one tablet by mouth two times daily for chronic back pain   rosuvastatin (CRESTOR) 40 MG tablet Take 40 mg by mouth daily.   SYMBICORT 160-4.5 MCG/ACT inhaler TAKE 2 PUFFS BY MOUTH TWICE A DAY   zolpidem (AMBIEN) 10 MG tablet Take 1 tablet (10 mg total) by mouth at bedtime.   [DISCONTINUED] glipiZIDE (GLUCOTROL XL) 10 MG 24 hr tablet Take 1 tablet (10 mg total) by mouth daily with breakfast. Take two tablets every [name] with breakfast (Patient taking differently: Take 10 mg by mouth daily with breakfast. )   [DISCONTINUED] oxyCODONE (OXYCONTIN) 40 mg 12 hr tablet Take one  tablet by mouth two times daily for chronic back pain   [DISCONTINUED] rosuvastatin (CRESTOR) 40 MG tablet TAKE 1 TABLET BY MOUTH EVERY DAY     Allergies:   Morphine, Ace inhibitors, Aspirin, Losartan, Tapentadol, Tomato, and Adhesive [tape]   Social History   Tobacco Use   Smoking status: Never Smoker   Smokeless tobacco: Never Used  Substance Use Topics   Alcohol use: No   Drug use: No     Family Hx: The patient's family history includes Asthma in her mother; Cancer in her father and mother; Colon cancer in her father; Heart attack in her father; Heart disease in her mother; Hypertension in her  mother. There is no history of Colon polyps.  ROS:   Please see the history of present illness.    All other systems reviewed and are negative.   Prior CV studies:   The following studies were reviewed today:  Cardiac Cath 05/08/2018  Prox LAD to Mid LAD lesion is 10% stenosed.  Mid LAD lesion is 20% stenosed.   No significant coronary  obstructive disease with mild 10 to 95% mid systolic bridging of the LAD; normal ramus intermediate, left circumflex, and dominant RCA.  LVEDP 14 mm.  Previous echo Doppler study EF 60 to 65%.  RECOMMENDATION: Medical therapy.  Suspect nonischemic etiology to the patient's chest pain.  Recommend weight loss, optimal blood pressure control with target blood pressure less than 130/80.  Lipid-lowering therapy with target LDL less than 70 in this diabetic female.  Labs/Other Tests and Data Reviewed:    EKG: Not completed as this is a virtual visit Recent Labs: 05/06/2018: TSH 6.946 05/09/2018: Hemoglobin 12.3; Platelets 381 05/22/2018: Magnesium 1.8 08/28/2018: ALT 51; BUN 17; Creat 0.90; Potassium 4.6; Sodium 137   Recent Lipid Panel Lab Results  Component Value Date/Time   CHOL 90 08/28/2018 09:03 AM   TRIG 179 (H) 08/28/2018 09:03 AM   HDL 31 (L) 08/28/2018 09:03 AM   CHOLHDL 2.9 08/28/2018 09:03 AM   LDLCALC 33 08/28/2018 09:03 AM    Wt Readings from Last 3 Encounters:  12/19/18 (!) 310 lb (140.6 kg)  11/05/18 (!) 326 lb 0.6 oz (147.9 kg)  10/03/18 300 lb (136.1 kg)     Objective:    Vital Signs:  BP (!) 157/86    Pulse 84    Ht '5\' 4"'$  (1.626 m)    Wt (!) 310 lb (140.6 kg)    BMI 53.21 kg/m  Limited due to telephone visit General: Awake alert oriented no acute distress Respirations: No auscultated issues with breathing over the phone she did not have any wheezing or coughing Psych, responded well to her questions and answered appropriately.   ASSESSMENT & PLAN:    1.  Uncontrolled hypertension: She was placed on carvedilol but was  unable to tolerate due to elevated blood glucose.  She had been on metoprolol 12.5 mg twice daily and did tolerate that.  However she is gained some weight and her blood pressure remained elevated after stopping carvedilol for several weeks.  I will restart metoprolol at 25 mg twice daily and see her in 1 month for follow-up to assess her response to medications.  The patient will call us for any problems associated with reinstitution of metoprolol.  2.  Hyperlipidemia: We will need to have follow-up lipids and LFTs when seen by primary care.  Although she does not have significant coronary artery disease, with multiple cardiovascular risk factors will need to keep her LDL less than 70.  3.  Morbid obesity: She had been on an exercise plan with a more strict diet.  Since pandemic, she has not been as active.  She is advised to take walks every day and to do her best to go back on a more healthy eating habit.  COVID-19 Education: The signs and symptoms of COVID-19 were discussed with the patient and how to seek care for testing (follow up with PCP or arrange E-visit).  The importance of social distancing was discussed today.  Time:   Today, I have spent 20 minutes with the patient with telehealth technology discussing the above problems.     Medication Adjustments/Labs and Tests Ordered: Current medicines are reviewed at length with the patient today.  Concerns regarding medicines are outlined above.   Tests Ordered: No orders of the defined types were placed in this encounter.   Medication Changes: No orders of the defined types were placed in this encounter.   Disposition:  Follow up 1 month  Signed, Phill Myron. West Pugh, ANP, AACC  12/19/2018 10:27 AM  [facility]

## 2018-12-20 ENCOUNTER — Ambulatory Visit: Payer: Medicare Other | Admitting: "Endocrinology

## 2018-12-20 LAB — COMPLETE METABOLIC PANEL WITH GFR
AG Ratio: 1.4 (calc) (ref 1.0–2.5)
ALT: 28 U/L (ref 6–29)
AST: 24 U/L (ref 10–35)
Albumin: 4.3 g/dL (ref 3.6–5.1)
Alkaline phosphatase (APISO): 92 U/L (ref 37–153)
BUN: 14 mg/dL (ref 7–25)
CO2: 29 mmol/L (ref 20–32)
Calcium: 9.8 mg/dL (ref 8.6–10.4)
Chloride: 101 mmol/L (ref 98–110)
Creat: 0.96 mg/dL (ref 0.50–1.05)
GFR, Est African American: 76 mL/min/{1.73_m2} (ref >=60)
GFR, Est Non African American: 65 mL/min/{1.73_m2} (ref >=60)
Globulin: 3 g/dL (calc) (ref 1.9–3.7)
Glucose, Bld: 228 mg/dL — ABNORMAL HIGH (ref 65–139)
Potassium: 4.1 mmol/L (ref 3.5–5.3)
Sodium: 139 mmol/L (ref 135–146)
Total Bilirubin: 0.4 mg/dL (ref 0.2–1.2)
Total Protein: 7.3 g/dL (ref 6.1–8.1)

## 2018-12-20 LAB — HEMOGLOBIN A1C
Hgb A1c MFr Bld: 11.9 % of total Hgb — ABNORMAL HIGH (ref <5.7)
Mean Plasma Glucose: 295 (calc)
eAG (mmol/L): 16.3 (calc)

## 2018-12-25 ENCOUNTER — Encounter: Payer: Medicare Other | Attending: Family Medicine | Admitting: Nutrition

## 2018-12-25 ENCOUNTER — Encounter: Payer: Self-pay | Admitting: Nutrition

## 2018-12-25 ENCOUNTER — Other Ambulatory Visit: Payer: Self-pay

## 2018-12-25 VITALS — Wt 310.0 lb

## 2018-12-25 DIAGNOSIS — IMO0001 Reserved for inherently not codable concepts without codable children: Secondary | ICD-10-CM

## 2018-12-25 DIAGNOSIS — E1165 Type 2 diabetes mellitus with hyperglycemia: Secondary | ICD-10-CM | POA: Insufficient documentation

## 2018-12-25 DIAGNOSIS — Z6841 Body Mass Index (BMI) 40.0 and over, adult: Secondary | ICD-10-CM | POA: Insufficient documentation

## 2018-12-25 DIAGNOSIS — Z794 Long term (current) use of insulin: Secondary | ICD-10-CM | POA: Insufficient documentation

## 2018-12-25 DIAGNOSIS — I10 Essential (primary) hypertension: Secondary | ICD-10-CM | POA: Insufficient documentation

## 2018-12-25 DIAGNOSIS — E782 Mixed hyperlipidemia: Secondary | ICD-10-CM | POA: Insufficient documentation

## 2018-12-25 NOTE — Progress Notes (Signed)
Telelphone visit Medical Nutrition Therapy:  Appt start time: 1100 end time: 1130Follow up Assessment:  Primary concerns today:  [place] Obesity and Dm Type 2. Possible fatty liver with elevated liver enzymes. BMI   Sees PCP Dr. Moshe Cipro.  Her daughter is helping out a lot preparing meals and making sure she is eating three meals per day.  Currently being treated for a cough and cough meds are making BS in 190's. Previously FBS were in 130's.  Saw heart doctor recently.. Had a yeast infection when she took antibiotics. FBS:  190-200's BS at night same. . 30 units of Lantus, Metformin 1000 mg BID. Scheduled to see Dr. Dorris Fetch on Friday of this week. Making excellent progress. Lost 15 lbs in the last month. Has been walking more in her house.   Goals previous set:   1. Eat by 9 am--Done 2. Walk 15 minutes a day- walking in house. 3. Increase vegetables with lunch and dinner-doing very well. 4. Keep drinking water only and avoid diet sodas- dpne. Lose 1 lb per week-met lost 15 lbs in the last month. Get A1C to 7%-- work in progress.. Lab Results  Component Value Date   HGBA1C 11.9 (H) 12/19/2018     Wt Readings from Last 3 Encounters:  12/19/18 (!) 310 lb (140.6 kg)  11/05/18 (!) 326 lb 0.6 oz (147.9 kg)  10/03/18 300 lb (136.1 kg)   Ht Readings from Last 3 Encounters:  12/19/18 '5\' 4"'$  (1.626 m)  11/05/18 '5\' 4"'$  (1.626 m)  10/03/18 '5\' 4"'$  (1.626 m)   There is no height or weight on file to calculate BMI. '@BMIFA'$ @ Facility age limit for growth percentiles is 20 years. Facility age limit for growth percentiles is 20 years.  Lab Results  Component Value Date   HGBA1C 11.9 (H) 12/19/2018   CMP Latest Ref Rng & Units 12/19/2018 08/28/2018 05/22/2018  Glucose 65 - 139 mg/dL 228(H) 393(H) 152(H)  BUN 7 - 25 mg/dL '14 17 24  '$ Creatinine 0.50 - 1.05 mg/dL 0.96 0.90 1.17(H)  Sodium 135 - 146 mmol/L 139 137 137  Potassium 3.5 - 5.3 mmol/L 4.1 4.6 4.5  Chloride 98 - 110 mmol/L 101 98 101  CO2  20 - 32 mmol/L '29 29 27  '$ Calcium 8.6 - 10.4 mg/dL 9.8 9.6 9.6  Total Protein 6.1 - 8.1 g/dL 7.3 7.6 -  Total Bilirubin 0.2 - 1.2 mg/dL 0.4 0.5 -  Alkaline Phos 38 - 126 U/L - - -  AST 10 - 35 U/L 24 37(H) -  ALT 6 - 29 U/L 28 51(H) -   Lipid Panel     Component Value Date/Time   CHOL 90 08/28/2018 0903   TRIG 179 (H) 08/28/2018 0903   HDL 31 (L) 08/28/2018 0903   CHOLHDL 2.9 08/28/2018 0903   VLDL 16 05/06/2018 0335   LDLCALC 33 08/28/2018 0903    Preferred Learning Style:   No preference indicated   Learning Readiness:   Ready  Change in progress   MEDICATIONS:    DIETARY INTAKE:  24-hr recall:  B ( AM):  Egg white, apples Snk ( AM): L ( PM): chicken, crackers, broccoli, nectarine  water Snk ( PM)  D ( PM):  Chef  Chicken salad, water,  Snk ( PM):  Beverages: water   Usual physical activity: ADL  Estimated energy needs: 1200  calories 135 g carbohydrates 90 g protein 33 g fat  Progress Towards Goal(s):  In progress.   Nutritional Diagnosis:  NB-1.1 Food  and nutrition-related knowledge deficit As related to Diabetes.  As evidenced by A1C >14%    Intervention:  Nutrition and Diabetes education provided on My Plate, CHO counting, meal planning, portion sizes, timing of meals, avoiding snacks between meals unless having a low blood sugar, target ranges for A1C and blood sugars, signs/symptoms and treatment of hyper/hypoglycemia, monitoring blood sugars, taking medications as prescribed, benefits of exercising 30 minutes per day and prevention of complications of DM.   Goal Keep up great job!1  1. Eat by 9 am--Done 2. Walk 15 minutes a day- walking in house. 3. Increase vegetables with lunch and dinner-doing very well. 4. Keep drinking water only and avoid diet sodas- dpne. Lose 1 lb per week-met lost 15 lbs in the last month. Get A1C to 7%-- work in progress..   Teaching Method Utilized:  Visual Auditory Hands on  Handouts given during visit  include:  The Plate Method   Meal Plan Card   Barriers to learning/adherence to lifestyle change: none  Demonstrated degree of understanding via:  Teach Back   Monitoring/Evaluation:  Dietary intake, exercise, meal planning, and body weight in 3-4 months.Marland Kitchen Marland Kitchen

## 2018-12-25 NOTE — Patient Instructions (Signed)
Goal Keep up great job!1  1. Eat by 9 am--Done 2. Walk 15 minutes a day- walking in house. 3. Increase vegetables with lunch and dinner-doing very well. 4. Keep drinking water only and avoid diet sodas- dpne. Lose 1 lb per week-met lost 15 lbs in the last month. Get A1C to 7%-- work in progress.Marland Kitchen

## 2018-12-26 ENCOUNTER — Encounter: Payer: Self-pay | Admitting: "Endocrinology

## 2018-12-26 ENCOUNTER — Ambulatory Visit (INDEPENDENT_AMBULATORY_CARE_PROVIDER_SITE_OTHER): Payer: Medicare Other | Admitting: "Endocrinology

## 2018-12-26 DIAGNOSIS — E782 Mixed hyperlipidemia: Secondary | ICD-10-CM

## 2018-12-26 DIAGNOSIS — I10 Essential (primary) hypertension: Secondary | ICD-10-CM

## 2018-12-26 DIAGNOSIS — E1165 Type 2 diabetes mellitus with hyperglycemia: Secondary | ICD-10-CM | POA: Diagnosis not present

## 2018-12-26 DIAGNOSIS — E039 Hypothyroidism, unspecified: Secondary | ICD-10-CM | POA: Diagnosis not present

## 2018-12-26 MED ORDER — LANTUS SOLOSTAR 100 UNIT/ML ~~LOC~~ SOPN: 40.0000 [IU] | PEN_INJECTOR | Freq: Every day | SUBCUTANEOUS | 2 refills | Status: DC

## 2018-12-26 NOTE — Progress Notes (Signed)
12/26/2018, 4:56 PM                                                    Endocrinology Telehealth Visit Follow up Note -During COVID -19 Pandemic  This visit type was conducted due to national recommendations for restrictions regarding the COVID-19 Pandemic  in an effort to limit this patient's exposure and mitigate transmission of the corona virus.  Due to her co-morbid illnesses, Angela [name] is at  moderate to high risk for complications without adequate follow up.  This format is felt to be most appropriate for her at this time.  I connected with this patient on 12/26/2018   by telephone and verified that I am speaking with the correct person using two identifiers. Angela [name], [birth year]. she has verbally consented to this visit. All issues noted in this document were discussed and addressed. The format was not optimal for physical exam.     Subjective:    [name], female    DOB: Aug 27, 1960.  Angela [name] is being engaged in telehealth in consultation for management of currently uncontrolled symptomatic diabetes requested by  Fayrene Helper, MD.   Past Medical History:  Diagnosis Date  . Anxiety   . Arthritis   . Asthma   . Coronary atherosclerosis of native coronary artery    Mild nonobstructive CAD by cardiac catheterization 2008 - Dr. Terrence Dupont  . Degenerative disc disease   . Depression   . Dysrhythmia   . Essential hypertension, benign   . Folliculitis 5/79/7282  . GERD (gastroesophageal reflux disease)   . H/O hiatal hernia   . Helicobacter pylori gastritis 2008  . Hypercholesterolemia   . Hypertension   . Hyperthyroidism    s/p radiation  . Left-sided epistaxis 05/15/2018  . Low back pain   . Migraine   . Nephrolithiasis    Recurring episodes since 2004  . Severe obesity (BMI >= 40) (HCC)   . Sleep apnea    STOP BANG SCORE 6no cpap used  . Type 2 diabetes mellitus with diabetic neuropathy [facility])     Past Surgical  History:  Procedure Laterality Date  . ABDOMINAL HYSTERECTOMY    . BACK SURGERY  1993  . BACK SURGERY  1994  . BACK SURGERY  2002  . BACK SURGERY  2011   [place] Left   . CHOLECYSTECTOMY  1996  . COLONOSCOPY  2000 [facility]   INT HEMORRHOIDS/FISSURE  . COLONOSCOPY  2003 [facility], CHANGE IN BOWEL HABITS   INT HEMORRHOIDS  . COLONOSCOPY  2006 [facility]   INT HEMORRHOIDS  . COLONOSCOPY  2007 [facility]   INT HEMORRHOIDS  . CYSTOSCOPY WITH STENT PLACEMENT Left 07/25/2012   Procedure: CYSTOSCOPY, left retograde pyelogram WITH left ureteral  STENT PLACEMENT;  Surgeon: Claybon Jabs, MD;  Location: [facility];  Service: Urology;  Laterality: Left;  . CYSTOSCOPY/RETROGRADE/URETEROSCOPY  12/04/2011   Procedure: CYSTOSCOPY/RETROGRADE/URETEROSCOPY;  Surgeon: Molli Hazard, MD;  Location: [facility];  Service: Urology;  Laterality: Right;  . CYSTOSCOPY/RETROGRADE/URETEROSCOPY/STONE EXTRACTION WITH BASKET Left 09/03/2012   Procedure: CYSTOSCOPY/RETROGRADE pyelogram/digital URETEROSCOPY/STONE EXTRACTION WITH BASKET, left stent removal;  Surgeon: Molli Hazard, MD;  Location: [facility];  Service: Urology;  Laterality: Left;  . ESOPHAGOGASTRODUODENOSCOPY  2008   [facility]O:RVIFB hiatal  hernia./Normal esophagus without evidence of Barrett's mass, stricture, erosion or ulceration./Normal duodenal bulb and second portion of the duodenum./Diffuse erythema in the body and the antrum with occasional erosion.  Biopsies obtained via cold forceps to evaluate for H. pylori gastritis  . FRACTURE SURGERY  1999   right clavicle  . HOLMIUM LASER APPLICATION Left 07/05/4825   Procedure: HOLMIUM LASER APPLICATION;  Surgeon: Molli Hazard, MD;  Location: [facility];  Service: Urology;  Laterality: Left;  . KNEE ARTHROSCOPY  10/04/2010,    right knee arthroscopy, dr Theda Sers  . LEFT HEART CATH AND CORONARY ANGIOGRAPHY N/A 05/08/2018   Procedure: LEFT HEART CATH AND CORONARY ANGIOGRAPHY;  Surgeon: Troy Sine, MD;  Location: [facility];  Service: Cardiovascular;  Laterality: N/A;  . left knee arthroscopic surgery  1999  . Left salphingectomy secondary to ectopic pregnancy  1991  . PARTIAL HYSTERECTOMY  1991  . rotary cuff  Right 05/14/2014   [facility]  . UPPER GASTROINTESTINAL ENDOSCOPY  2008 abd pain   H. pylori gastritis  . UPPER GASTROINTESTINAL ENDOSCOPY  1996 [place] [place]   PUD    Social History   Socioeconomic History  . Marital status: Single    Spouse name: Not on file  . Number of children: 1  . Years of education: 76  . Highest education level: 12th grade  Occupational History  . Occupation: Disable   Social Needs  . Financial resource strain: Not hard at all  . Food insecurity    Worry: Never true    Inability: Never true  . Transportation needs    Medical: No    Non-medical: No  Tobacco Use  . Smoking status: Never Smoker  . Smokeless tobacco: Never Used  Substance and Sexual Activity  . Alcohol use: No  . Drug use: No  . Sexual activity: Not Currently    Partners: Male    Birth control/protection: Surgical    Comment: hyst  Lifestyle  . Physical activity    Days per week: 7 days    Minutes per session: 20 min  . Stress: Only a little  Relationships  . Social connections    Talks on phone: More than three times a week    Gets together: Once a week    Attends religious service: 1 to 4 times per year    Active member of club or organization: Yes    Attends meetings of clubs or organizations: Never    Relationship status: Never married  Other Topics Concern  . Not on file  Social History Narrative   Lives alone     Family History  Problem Relation Age of Onset  . Heart disease Mother   . Hypertension Mother   . Cancer Mother        Cervical   . Asthma Mother   . Heart attack Father   . Cancer Father        Prostate  . Colon cancer Father        DECEASED AGE 83  . Colon polyps Neg Hx     Outpatient Encounter Medications as of  12/26/2018  Medication Sig  . albuterol (PROAIR HFA) 108 (90 Base) MCG/ACT inhaler INHALE 2 PUFFS BY MOUTH EVERY 6-8HRS AS NEEDED (Patient taking differently: Inhale 2 puffs into the lungs every 6 (six) hours as needed for wheezing or shortness of breath. )  . amLODipine (NORVASC) 10 MG tablet Take 1 tablet (10 mg total) by mouth daily.  . benzonatate (  TESSALON PERLES) 100 MG capsule Take 1 capsule (100 mg total) by mouth 3 (three) times daily as needed for cough.  . blood glucose meter kit and supplies Dispense based on patient and insurance preference. Once daily testing dx. e11.9  . cloNIDine (CATAPRES - DOSED IN MG/24 HR) 0.3 mg/24hr patch APPLY 1 PATCH TO SKIN ONCE A WEEK  . cloNIDine (CATAPRES) 0.3 MG tablet TAKE 1 TABLET (0.3 MG TOTAL) BY MOUTH AT BEDTIME.  . cyclobenzaprine (FLEXERIL) 10 MG tablet TAKE 1 TABLET BY MOUTH THREE TIMES A DAY  . diazepam (VALIUM) 5 MG tablet Take one tablet by mouth three times daily for muscle spasm  . DULoxetine (CYMBALTA) 60 MG capsule TAKE 1 CAPSULE BY MOUTH TWICE A DAY (Patient taking differently: Take 60 mg by mouth 2 (two) times daily. )  . ergocalciferol (VITAMIN D2) 50000 units capsule Take 1 capsule (50,000 Units total) by mouth once a week. One capsule once weekly  . esomeprazole (NEXIUM) 40 MG capsule Take 1 capsule (40 mg total) by mouth daily.  . fluconazole (DIFLUCAN) 150 MG tablet T^ake one tablet I once daily as needed, for vaginal itch associated wit antibiotic use  . fluticasone (FLONASE) 50 MCG/ACT nasal spray Place 2 sprays into both nostrils daily. (Patient taking differently: Place 2 sprays into both nostrils daily as needed for allergies. )  . gabapentin (NEURONTIN) 800 MG tablet TAKE 1 TABLET BY MOUTH FOUR TIMES A DAY  . glipiZIDE (GLUCOTROL) 10 MG tablet Take 10 mg by mouth daily before breakfast.  . glucose blood test strip Use as instructed four times daily testing dx e11.65  . hydrOXYzine (ATARAX/VISTARIL) 10 MG tablet Take 10-30 mg  by mouth at bedtime.   . Insulin Glargine (LANTUS SOLOSTAR) 100 UNIT/ML Solostar Pen Inject 40 Units into the skin at bedtime.  . Insulin Pen Needle (B-D ULTRAFINE III SHORT PEN) 31G X 8 MM MISC 1 each by Does not apply route as directed.  Marland Kitchen ipratropium-albuterol (DUONEB) 0.5-2.5 (3) MG/3ML SOLN Take 3 mLs by nebulization every 6 (six) hours as needed. (Patient taking differently: Take 3 mLs by nebulization every 6 (six) hours as needed (SOB). )  . KLOR-CON M20 20 MEQ tablet TAKE 1 TABLET (20 MEQ TOTAL) BY MOUTH 2 (TWO) TIMES DAILY.  Marland Kitchen Lancets (ONETOUCH DELICA PLUS [id number]) MISC TEST ONCE DAILY  . levothyroxine (SYNTHROID) 50 MCG tablet TAKE 1 TABLET BY MOUTH DAILY BEFORE BREAKFAST  . loratadine (CLARITIN) 10 MG tablet Take 1 tablet (10 mg total) by mouth daily.  . meclizine (ANTIVERT) 25 MG tablet Take 25 mg by mouth 3 (three) times daily as needed for dizziness.  . metFORMIN (GLUCOPHAGE) 1000 MG tablet TAKE 1 TABLET BY MOUTH TWICE A DAY  . metoprolol tartrate (LOPRESSOR) 25 MG tablet Take 1 tablet (25 mg total) by mouth 2 (two) times daily.  . montelukast (SINGULAIR) 10 MG tablet TAKE 1 TABLET (10 MG TOTAL) BY MOUTH AT BEDTIME.  . nitroGLYCERIN (NITROSTAT) 0.4 MG SL tablet Place 1 tablet (0.4 mg total) under the tongue every 5 (five) minutes as needed for chest pain.  Marland Kitchen ondansetron (ZOFRAN) 4 MG tablet Take 1 tablet (4 mg total) by mouth every 8 (eight) hours as needed for nausea or vomiting.  Marland Kitchen oxyCODONE (OXYCONTIN) 40 mg 12 hr tablet Take one tablet by mouth two times daily for chronic back pain  . rosuvastatin (CRESTOR) 40 MG tablet Take 20 mg by mouth daily.  . SYMBICORT 160-4.5 MCG/ACT inhaler TAKE 2 PUFFS BY MOUTH TWICE  A DAY  . zolpidem (AMBIEN) 10 MG tablet Take 1 tablet (10 mg total) by mouth at bedtime.  . [DISCONTINUED] Insulin Glargine (LANTUS SOLOSTAR) 100 UNIT/ML Solostar Pen Inject 30 Units into the skin daily.   No facility-administered encounter medications on file as of  12/26/2018.     ALLERGIES: Allergies  Allergen Reactions  . Morphine Other (See Comments)    Reaction with esophagus, unable to swallow.   . Ace Inhibitors Cough  . Aspirin Other (See Comments)    Reaction with esophagus, unable to swallow.   . Losartan Cough  . Tapentadol Other (See Comments)    Nausea, increased sleepiness, h/a  . Tomato Other (See Comments)    Acid reflux due to acid in tomato  . Adhesive [Tape] Rash    VACCINATION STATUS: Immunization History  Administered Date(s) Administered  . Influenza Split 03/30/2011  . Influenza Whole 03/21/2005, 02/09/2006  . Influenza,inj,Quad PF,6+ Mos 02/11/2014, 07/27/2015, 03/09/2016, 01/29/2017, 02/07/2018  . Pneumococcal Polysaccharide-23 04/17/2006, 09/14/2015  . Td 04/17/2006    Diabetes She presents for her follow-up diabetic visit. She has type 2 diabetes mellitus. Her disease course has been improving. There are no hypoglycemic associated symptoms. Pertinent negatives for hypoglycemia include no confusion, headaches, pallor or seizures. Associated symptoms include fatigue. Pertinent negatives for diabetes include no blurred vision, no chest pain, no polydipsia, no polyphagia and no polyuria. There are no hypoglycemic complications. Symptoms are improving. Diabetic complications include heart disease. Risk factors for coronary artery disease include dyslipidemia, diabetes mellitus, family history, hypertension, obesity, sedentary lifestyle and tobacco exposure. Current diabetic treatment includes oral agent (dual therapy). She is following a generally unhealthy diet. When asked about meal planning, she reported none. She has had a previous visit with a dietitian. She never participates in exercise. Her home blood glucose trend is increasing steadily. Her breakfast blood glucose range is generally 140-180 mg/dl. Her bedtime blood glucose range is generally 140-180 mg/dl. Her overall blood glucose range is 140-180 mg/dl. An ACE  inhibitor/angiotensin II receptor blocker is being taken. She does not see a podiatrist.Eye exam is not current.  Hyperlipidemia This is a chronic problem. The current episode started more than 1 year ago. The problem is controlled. Exacerbating diseases include diabetes and obesity. Pertinent negatives include no chest pain, myalgias or shortness of breath. Risk factors for coronary artery disease include diabetes mellitus, dyslipidemia, hypertension, obesity and a sedentary lifestyle.  Hypertension This is a chronic problem. The current episode started more than 1 year ago. The problem is uncontrolled. Pertinent negatives include no blurred vision, chest pain, headaches, palpitations or shortness of breath. Risk factors for coronary artery disease include family history, dyslipidemia, diabetes mellitus, obesity, sedentary lifestyle and post-menopausal state. Past treatments include angiotensin blockers. Hypertensive end-organ damage includes CAD/MI.     Review of Systems  Constitutional: Positive for fatigue. Negative for chills, fever and unexpected weight change.  HENT: Negative for trouble swallowing and voice change.   Eyes: Negative for blurred vision and visual disturbance.  Respiratory: Negative for cough, shortness of breath and wheezing.   Cardiovascular: Negative for chest pain, palpitations and leg swelling.  Gastrointestinal: Negative for diarrhea, nausea and vomiting.  Endocrine: Negative for cold intolerance, heat intolerance, polydipsia, polyphagia and polyuria.  Musculoskeletal: Negative for arthralgias and myalgias.  Skin: Negative for color change, pallor, rash and wound.  Neurological: Negative for seizures and headaches.  Psychiatric/Behavioral: Negative for confusion and suicidal ideas.    Objective:    There were no vitals taken for  this visit.  Wt Readings from Last 3 Encounters:  12/25/18 (!) 310 lb (140.6 kg)  12/19/18 (!) 310 lb (140.6 kg)  11/05/18 (!) 326 lb  0.6 oz (147.9 kg)     CMP ( most recent) CMP     Component Value Date/Time   NA 139 12/19/2018 1321   K 4.1 12/19/2018 1321   CL 101 12/19/2018 1321   CO2 29 12/19/2018 1321   GLUCOSE 228 (H) 12/19/2018 1321   BUN 14 12/19/2018 1321   CREATININE 0.96 12/19/2018 1321   CALCIUM 9.8 12/19/2018 1321   PROT 7.3 12/19/2018 1321   ALBUMIN 3.9 05/05/2018 2250   AST 24 12/19/2018 1321   ALT 28 12/19/2018 1321   ALKPHOS 63 05/05/2018 2250   BILITOT 0.4 12/19/2018 1321   GFRNONAA 65 12/19/2018 1321   GFRAA 76 12/19/2018 1321    Diabetic Labs (most recent): Lab Results  Component Value Date   HGBA1C 11.9 (H) 12/19/2018   HGBA1C >14.0 (H) 08/28/2018   HGBA1C 7.4 (H) 02/06/2018     Lipid Panel ( most recent) Lipid Panel     Component Value Date/Time   CHOL 90 08/28/2018 0903   TRIG 179 (H) 08/28/2018 0903   HDL 31 (L) 08/28/2018 0903   CHOLHDL 2.9 08/28/2018 0903   VLDL 16 05/06/2018 0335   LDLCALC 33 08/28/2018 0903      Assessment & Plan:   1. Uncontrolled type 2 diabetes mellitus with hyperglycemia ([place])   - Angela [name] has currently uncontrolled symptomatic type 2 DM since  58 years of age. -She continued to see improvement in her glycemic profile with A1c of 11.9% improving from   >14 %. Recent labs reviewed.  -her diabetes is complicated by coronary artery disease, obesity/sedentary life and she remains at a high risk for more acute and chronic complications which include CAD, CVA, CKD, retinopathy, and neuropathy. These are all discussed in detail with her.  - I have counseled her on diet management and weight loss, by adopting a carbohydrate restricted/protein rich diet.  - she  admits there is a room for improvement in her diet and drink choices. -  Suggestion is made for her to avoid simple carbohydrates  from her diet including Cakes, Sweet Desserts / Pastries, Ice Cream, Soda (diet and regular), Sweet Tea, Candies, Chips, Cookies, Sweet Pastries,  Store  Bought Juices, Alcohol in Excess of  1-2 drinks a day, Artificial Sweeteners, Coffee Creamer, and "Sugar-free" Products. This will help patient to have stable blood glucose profile and potentially avoid unintended weight gain.   - I encouraged her to switch to  unprocessed or minimally processed complex starch and increased protein intake (animal or plant source), fruits, and vegetables.  - she is advised to stick to a routine mealtimes to eat 3 meals  a day and avoid unnecessary snacks ( to snack only to correct hypoglycemia).   - she has been scheduled with Jearld Fenton, RDN, CDE for individualized diabetes education.  - I have approached her with the following individualized plan to manage diabetes and patient agrees:   -She is advised to increase her Lantus to 40 units nightly,  associated with monitoring of blood glucose twice a day-before breakfast and at bedtime.    -She will not need prandial insulin for now.    - she is warned not to take insulin without proper monitoring per orders.  - she is encouraged to call clinic for blood glucose levels less than 70 or above 300  mg /dl. - she is advised to continue metformin 1000 mg p.o. twice daily after breakfast and after supper, lower glipizide to 10 mg XL daily at breakfast only.  - she will be considered for incretin therapy as appropriate next visit.   2) Blood Pressure /Hypertension: she is advised to home monitor blood pressure and report if > 140/90 on 2 separate readings.  she is advised to continue her current medications including amlodipine 10 mg p.o. daily, carvedilol 12.5 mg p.o. twice daily, clonidine 0.3 mg/24-hour patch, clonidine 0.3 mg daily at bedtime, hydralazine 25 mg p.o. twice daily.  She will need work-up to rule out primary hypertension on subsequent visits.  3) Lipids/Hyperlipidemia:   Review of her recent lipid panel showed  controlled  LDL at 33.  she  is advised to continue   Crestor 40 mg p.o. daily at  bedtime.   Side effects and precautions discussed with her.  4)  Weight/Diet: She is obese, clearly complicating her diabetes care.  I discussed with her the fact that loss of 5 - 10% of her  current body weight will have the most impact on her diabetes management.  CDE Consult will be initiated . Exercise, and detailed carbohydrates information provided  -  detailed on discharge instructions.  5) hypothyroidism: -For new diagnosis of hypothyroidism, during her last visit, she was initiated on levothyroxine 50 mcg p.o. every morning.   - We discussed about the correct intake of her thyroid hormone, on empty stomach at fasting, with water, separated by at least 30 minutes from breakfast and other medications,  and separated by more than 4 hours from calcium, iron, multivitamins, acid reflux medications (PPIs). -Patient is made aware of the fact that thyroid hormone replacement is needed for life, dose to be adjusted by periodic monitoring of thyroid function tests.   6) Chronic Care/Health Maintenance:  -she  is on Statin medications and  is encouraged to initiate and continue to follow up with Ophthalmology, Dentist,  Podiatrist at least yearly or according to recommendations, and advised to  stay away from smoking. I have recommended yearly flu vaccine and pneumonia vaccine at least every 5 years; moderate intensity exercise for up to 150 minutes weekly; and  sleep for at least 7 hours a day.  - she is  advised to maintain close follow up with Fayrene Helper, MD for primary care needs, as well as her other providers for optimal and coordinated care.   - Patient Care Time Today:  25 min, of which >50% was spent in  counseling and the rest reviewing her  current and  previous labs/studies, previous treatments, her blood glucose readings, and medications' doses and developing a plan for long-term care based on the latest recommendations for standards of care.   [place] participated in  the discussions, expressed understanding, and voiced agreement with the above plans.  All questions were answered to her satisfaction. she is encouraged to contact clinic should she have any questions or concerns prior to her return visit.   Follow up plan: - Return in about 3 months (around 03/28/2019), or logs 6, for Bring Meter and Logs- A1c in Office.  Glade Lloyd, MD [facility] [address] [place], [place] [address] Phone: [phone]  Fax: [phone]    12/26/2018, 4:56 PM  This note was partially dictated with voice recognition software. Similar sounding words can be transcribed inadequately or may not  be corrected upon review.

## 2018-12-27 ENCOUNTER — Other Ambulatory Visit: Payer: Self-pay | Admitting: Family Medicine

## 2018-12-31 ENCOUNTER — Encounter: Payer: Self-pay | Admitting: Adult Health

## 2018-12-31 ENCOUNTER — Other Ambulatory Visit: Payer: Self-pay

## 2018-12-31 ENCOUNTER — Ambulatory Visit (INDEPENDENT_AMBULATORY_CARE_PROVIDER_SITE_OTHER): Payer: Medicare Other | Admitting: Adult Health

## 2018-12-31 VITALS — BP 148/88 | HR 78 | Temp 97.5°F | Ht 64.0 in | Wt 322.6 lb

## 2018-12-31 DIAGNOSIS — Z9989 Dependence on other enabling machines and devices: Secondary | ICD-10-CM | POA: Diagnosis not present

## 2018-12-31 DIAGNOSIS — G4733 Obstructive sleep apnea (adult) (pediatric): Secondary | ICD-10-CM | POA: Diagnosis not present

## 2018-12-31 NOTE — Patient Instructions (Signed)
Continue using CPAP nightly and greater than 4 hours each night °If your symptoms worsen or you develop new symptoms please let us know.  ° °

## 2018-12-31 NOTE — Progress Notes (Addendum)
PATIENT: Angela [name] DOB: 10-Jun-1960  REASON FOR VISIT: follow up HISTORY FROM: patient  HISTORY OF PRESENT ILLNESS: Today 12/31/18:  Angela [name] is a 58 year old female with a history of obstructive sleep apnea on CPAP.  Her download indicates that she use her machine 30 out of 30 days for compliance of100%.  She use her machine greater than 4 hours each night of 100 %.  On average she uses her machine 9 hours and 7 minutes. Her residual AHI is 1.4 on 10 cmH2O with EPR 3.  Her leak in the 95th percentile is 19 liters per minute.  She reports that the CPAP continues to work well for her.  She denies any issues.  She returns today for an evaluation.   REVIEW OF SYSTEMS: Out of a complete 14 system review of symptoms, the patient complains only of the following symptoms, and all other reviewed systems are negative.  Numbness, weakness, excessive thirst, cough, wheezing, shortness of breath, leg swelling, chest pain, snoring, itching, unexpected weight change  ALLERGIES: Allergies  Allergen Reactions  . Morphine Other (See Comments)    Reaction with esophagus, unable to swallow.   . Ace Inhibitors Cough  . Aspirin Other (See Comments)    Reaction with esophagus, unable to swallow.   . Losartan Cough  . Tapentadol Other (See Comments)    Nausea, increased sleepiness, h/a  . Tomato Other (See Comments)    Acid reflux due to acid in tomato  . Adhesive [Tape] Rash    HOME MEDICATIONS: Outpatient Medications Prior to Visit  Medication Sig Dispense Refill  . albuterol (PROAIR HFA) 108 (90 Base) MCG/ACT inhaler INHALE 2 PUFFS BY MOUTH EVERY 6-8HRS AS NEEDED (Patient taking differently: Inhale 2 puffs into the lungs every 6 (six) hours as needed for wheezing or shortness of breath. ) 8.5 Inhaler 1  . amLODipine (NORVASC) 10 MG tablet Take 1 tablet (10 mg total) by mouth daily. 90 tablet 3  . benzonatate (TESSALON PERLES) 100 MG capsule Take 1 capsule (100 mg total) by mouth 3 (three)  times daily as needed for cough. 30 capsule 0  . blood glucose meter kit and supplies Dispense based on patient and insurance preference. Once daily testing dx. e11.9 1 each 0  . cloNIDine (CATAPRES - DOSED IN MG/24 HR) 0.3 mg/24hr patch APPLY 1 PATCH TO SKIN ONCE A WEEK 12 patch 0  . cloNIDine (CATAPRES) 0.3 MG tablet TAKE 1 TABLET (0.3 MG TOTAL) BY MOUTH AT BEDTIME. 90 tablet 1  . cyclobenzaprine (FLEXERIL) 10 MG tablet TAKE 1 TABLET BY MOUTH THREE TIMES A DAY 90 tablet 1  . diazepam (VALIUM) 5 MG tablet Take one tablet by mouth three times daily for muscle spasm 90 tablet 5  . DULoxetine (CYMBALTA) 60 MG capsule TAKE 1 CAPSULE BY MOUTH TWICE A DAY (Patient taking differently: Take 60 mg by mouth 2 (two) times daily. ) 180 capsule 0  . ergocalciferol (VITAMIN D2) 50000 units capsule Take 1 capsule (50,000 Units total) by mouth once a week. One capsule once weekly 12 capsule 1  . esomeprazole (NEXIUM) 40 MG capsule Take 1 capsule (40 mg total) by mouth daily. 90 capsule 3  . fluconazole (DIFLUCAN) 150 MG tablet T^ake one tablet I once daily as needed, for vaginal itch associated wit antibiotic use 2 tablet 0  . fluticasone (FLONASE) 50 MCG/ACT nasal spray Place 2 sprays into both nostrils daily. (Patient taking differently: Place 2 sprays into both nostrils daily as needed for allergies. )  16 g 6  . gabapentin (NEURONTIN) 800 MG tablet TAKE 1 TABLET BY MOUTH FOUR TIMES A DAY 360 tablet 1  . glipiZIDE (GLUCOTROL) 10 MG tablet Take 10 mg by mouth daily before breakfast.    . glucose blood test strip Use as instructed four times daily testing dx e11.65 150 each 5  . hydrOXYzine (ATARAX/VISTARIL) 10 MG tablet Take 10-30 mg by mouth at bedtime.   2  . Insulin Glargine (LANTUS SOLOSTAR) 100 UNIT/ML Solostar Pen Inject 40 Units into the skin at bedtime. 5 pen 2  . Insulin Pen Needle (B-D ULTRAFINE III SHORT PEN) 31G X 8 MM MISC 1 each by Does not apply route as directed. 100 each 3  .  ipratropium-albuterol (DUONEB) 0.5-2.5 (3) MG/3ML SOLN Take 3 mLs by nebulization every 6 (six) hours as needed. (Patient taking differently: Take 3 mLs by nebulization every 6 (six) hours as needed (SOB). ) 360 mL 1  . KLOR-CON M20 20 MEQ tablet TAKE 1 TABLET (20 MEQ TOTAL) BY MOUTH 2 (TWO) TIMES DAILY. 180 tablet 1  . Lancets (ONETOUCH DELICA PLUS [id number]) MISC TEST ONCE DAILY 100 each 0  . levothyroxine (SYNTHROID) 50 MCG tablet TAKE 1 TABLET BY MOUTH DAILY BEFORE BREAKFAST 90 tablet 0  . loratadine (CLARITIN) 10 MG tablet Take 1 tablet (10 mg total) by mouth daily. 30 tablet 11  . meclizine (ANTIVERT) 25 MG tablet Take 25 mg by mouth 3 (three) times daily as needed for dizziness.    . metFORMIN (GLUCOPHAGE) 1000 MG tablet TAKE 1 TABLET BY MOUTH TWICE A DAY 180 tablet 1  . metoprolol tartrate (LOPRESSOR) 25 MG tablet Take 1 tablet (25 mg total) by mouth 2 (two) times daily. 180 tablet 3  . montelukast (SINGULAIR) 10 MG tablet TAKE 1 TABLET (10 MG TOTAL) BY MOUTH AT BEDTIME. 90 tablet 1  . nitroGLYCERIN (NITROSTAT) 0.4 MG SL tablet Place 1 tablet (0.4 mg total) under the tongue every 5 (five) minutes as needed for chest pain. 30 tablet 0  . ondansetron (ZOFRAN) 4 MG tablet Take 1 tablet (4 mg total) by mouth every 8 (eight) hours as needed for nausea or vomiting. 20 tablet 0  . oxyCODONE (OXYCONTIN) 40 mg 12 hr tablet Take one tablet by mouth two times daily for chronic back pain 60 tablet 0  . rosuvastatin (CRESTOR) 40 MG tablet Take 20 mg by mouth daily.    . SYMBICORT 160-4.5 MCG/ACT inhaler TAKE 2 PUFFS BY MOUTH TWICE A DAY 30.6 Inhaler 1  . zolpidem (AMBIEN) 10 MG tablet Take 1 tablet (10 mg total) by mouth at bedtime. 30 tablet 5   No facility-administered medications prior to visit.     PAST MEDICAL HISTORY: Past Medical History:  Diagnosis Date  . Anxiety   . Arthritis   . Asthma   . Coronary atherosclerosis of native coronary artery    Mild nonobstructive CAD by cardiac  catheterization 2008 - Dr. Terrence Dupont  . Degenerative disc disease   . Depression   . Dysrhythmia   . Essential hypertension, benign   . Folliculitis 0/34/9179  . GERD (gastroesophageal reflux disease)   . H/O hiatal hernia   . Helicobacter pylori gastritis 2008  . Hypercholesterolemia   . Hypertension   . Hyperthyroidism    s/p radiation  . Left-sided epistaxis 05/15/2018  . Low back pain   . Migraine   . Nephrolithiasis    Recurring episodes since 2004  . Severe obesity (BMI >= 40) (HCC)   .  Sleep apnea    STOP BANG SCORE 6no cpap used  . Type 2 diabetes mellitus with diabetic neuropathy ([place])     PAST SURGICAL HISTORY: Past Surgical History:  Procedure Laterality Date  . ABDOMINAL HYSTERECTOMY    . BACK SURGERY  1993  . BACK SURGERY  1994  . BACK SURGERY  2002  . BACK SURGERY  2011   [place] Left   . CHOLECYSTECTOMY  1996  . COLONOSCOPY  2000 [facility]   INT HEMORRHOIDS/FISSURE  . COLONOSCOPY  2003 [facility], CHANGE IN BOWEL HABITS   INT HEMORRHOIDS  . COLONOSCOPY  2006 [facility]   INT HEMORRHOIDS  . COLONOSCOPY  2007 [facility]   INT HEMORRHOIDS  . CYSTOSCOPY WITH STENT PLACEMENT Left 07/25/2012   Procedure: CYSTOSCOPY, left retograde pyelogram WITH left ureteral  STENT PLACEMENT;  Surgeon: Claybon Jabs, MD;  Location: [facility];  Service: Urology;  Laterality: Left;  . CYSTOSCOPY/RETROGRADE/URETEROSCOPY  12/04/2011   Procedure: CYSTOSCOPY/RETROGRADE/URETEROSCOPY;  Surgeon: Molli Hazard, MD;  Location: [facility];  Service: Urology;  Laterality: Right;  . CYSTOSCOPY/RETROGRADE/URETEROSCOPY/STONE EXTRACTION WITH BASKET Left 09/03/2012   Procedure: CYSTOSCOPY/RETROGRADE pyelogram/digital URETEROSCOPY/STONE EXTRACTION WITH BASKET, left stent removal;  Surgeon: Molli Hazard, MD;  Location: [facility];  Service: Urology;  Laterality: Left;  . ESOPHAGOGASTRODUODENOSCOPY  2008   ENI:DPOEU hiatal hernia./Normal esophagus without evidence of Barrett's mass,  stricture, erosion or ulceration./Normal duodenal bulb and second portion of the duodenum./Diffuse erythema in the body and the antrum with occasional erosion.  Biopsies obtained via cold forceps to evaluate for H. pylori gastritis  . FRACTURE SURGERY  1999   right clavicle  . HOLMIUM LASER APPLICATION Left 06/04/5359   Procedure: HOLMIUM LASER APPLICATION;  Surgeon: Molli Hazard, MD;  Location: [facility];  Service: Urology;  Laterality: Left;  . KNEE ARTHROSCOPY  10/04/2010,    right knee arthroscopy, dr Theda Sers  . LEFT HEART CATH AND CORONARY ANGIOGRAPHY N/A 05/08/2018   Procedure: LEFT HEART CATH AND CORONARY ANGIOGRAPHY;  Surgeon: Troy Sine, MD;  Location: [place] [facility];  Service: Cardiovascular;  Laterality: N/A;  . left knee arthroscopic surgery  1999  . Left salphingectomy secondary to ectopic pregnancy  1991  . PARTIAL HYSTERECTOMY  1991  . rotary cuff  Right 05/14/2014   [facility]  . UPPER GASTROINTESTINAL ENDOSCOPY  2008 abd pain   H. pylori gastritis  . UPPER GASTROINTESTINAL ENDOSCOPY  [place] [facility] [place]   P[facility]    FAMILY HISTORY: Family History  Problem Relation Age of Onset  . Heart disease Mother   . Hypertension Mother   . Cancer Mother        Cervical   . Asthma Mother   . Heart attack Father   . Cancer Father        Prostate  . Colon cancer Father        DECEASED AGE 58  . Colon polyps Neg Hx     SOCIAL HISTORY: Social History   Socioeconomic History  . Marital status: Single    Spouse name: Not on file  . Number of children: 1  . Years of education: 67  . Highest education level: 12th grade  Occupational History  . Occupation: Disable   Social Needs  . Financial resource strain: Not hard at all  . Food insecurity    Worry: Never true    Inability: Never true  . Transportation needs    Medical: No    Non-medical:  No  Tobacco Use  . Smoking status: Never Smoker  . Smokeless tobacco: Never Used  Substance and Sexual Activity  . Alcohol  use: No  . Drug use: No  . Sexual activity: Not Currently    Partners: Male    Birth control/protection: Surgical    Comment: hyst  Lifestyle  . Physical activity    Days per week: 7 days    Minutes per session: 20 min  . Stress: Only a little  Relationships  . Social connections    Talks on phone: More than three times a week    Gets together: Once a week    Attends religious service: 1 to 4 times per year    Active member of club or organization: Yes    Attends meetings of clubs or organizations: Never    Relationship status: Never married  . Intimate partner violence    Fear of current or ex partner: No    Emotionally abused: No    Physically abused: No    Forced sexual activity: No  Other Topics Concern  . Not on file  Social History Narrative   Lives alone       PHYSICAL EXAM  Vitals:   12/31/18 0833  BP: (!) 148/88  Pulse: 78  Temp: (!) 97.5 F (36.4 C)  Weight: (!) 322 lb 9.6 oz (146.3 kg)  Height: '5\' 4"'$  (1.626 m)   Body mass index is 55.37 kg/m.  Generalized: Well developed, in no acute distress  Chest: Lungs clear to auscultation bilaterally  Neurological examination  Mentation: Alert oriented to time, place, history taking. Follows all commands speech and language fluent Cranial nerve II-XII:Extraocular movements were full, visual field were full on confrontational test.  Head turning and shoulder shrug  were normal and symmetric. Motor: The motor testing reveals 5 over 5 strength of all 4 extremities. Good symmetric motor tone is noted throughout.  Gait and station: Gait is normal.  DIAGNOSTIC DATA (LABS, IMAGING, TESTING) - I reviewed patient records, labs, notes, testing and imaging myself where available.  Lab Results  Component Value Date   WBC 5.9 05/09/2018   HGB 12.3 05/09/2018   HCT 40.0 05/09/2018   MCV 82.0 05/09/2018   PLT 381 05/09/2018      Component Value Date/Time   NA 139 12/19/2018 1321   K 4.1 12/19/2018 1321   CL 101  12/19/2018 1321   CO2 29 12/19/2018 1321   GLUCOSE 228 (H) 12/19/2018 1321   BUN 14 12/19/2018 1321   CREATININE 0.96 12/19/2018 1321   CALCIUM 9.8 12/19/2018 1321   PROT 7.3 12/19/2018 1321   ALBUMIN 3.9 05/05/2018 2250   AST 24 12/19/2018 1321   ALT 28 12/19/2018 1321   ALKPHOS 63 05/05/2018 2250   BILITOT 0.4 12/19/2018 1321   GFRNONAA 65 12/19/2018 1321   GFRAA 76 12/19/2018 1321   Lab Results  Component Value Date   CHOL 90 08/28/2018   HDL 31 (L) 08/28/2018   LDLCALC 33 08/28/2018   TRIG 179 (H) 08/28/2018   CHOLHDL 2.9 08/28/2018   Lab Results  Component Value Date   HGBA1C 11.9 (H) 12/19/2018   No results found for: VITAMINB12 Lab Results  Component Value Date   TSH 6.946 (H) 05/06/2018      ASSESSMENT AND PLAN 58 y.o. year old female  has a past medical history of Anxiety, Arthritis, Asthma, Coronary atherosclerosis of native coronary artery, Degenerative disc disease, Depression, Dysrhythmia, Essential hypertension, benign, Folliculitis (10/20/2977), GERD (gastroesophageal  reflux disease), H/O hiatal hernia, Helicobacter pylori gastritis (2008), Hypercholesterolemia, Hypertension, Hyperthyroidism, Left-sided epistaxis (05/15/2018), Low back pain, Migraine, Nephrolithiasis, Severe obesity (BMI >= 40) ([place]), Sleep apnea, and Type 2 diabetes mellitus with diabetic neuropathy ([employer]). here with:  1.  Obstructive sleep apnea on CPAP  Patient CPAP download shows excellent compliance and good treatment of her apnea.  She is encouraged to continue using CPAP nightly and greater than 4 hours each night.  She is advised that if her symptoms worsen or she develops new symptoms she should let us know.  She will follow-up in 1 year or sooner if needed.     I spent 15 minutes with the patient. 50% of this time was spent reviewing CPAP download   Ward Givens, MSN, NP-C 12/31/2018, 8:39 AM [facility] [address], [place], [place] [address] [phone]  I reviewed the above note and documentation by the Nurse Practitioner and agree with the history, physical exam, assessment and plan as outlined above. I was immediately available for consultation. Star Age, MD, PhD [facility])

## 2019-01-01 ENCOUNTER — Other Ambulatory Visit: Payer: Self-pay

## 2019-01-01 ENCOUNTER — Ambulatory Visit (INDEPENDENT_AMBULATORY_CARE_PROVIDER_SITE_OTHER): Payer: Medicare Other | Admitting: Women's Health

## 2019-01-01 ENCOUNTER — Encounter: Payer: Self-pay | Admitting: Women's Health

## 2019-01-01 VITALS — BP 148/98 | HR 80 | Ht 64.0 in | Wt 322.4 lb

## 2019-01-01 DIAGNOSIS — L732 Hidradenitis suppurativa: Secondary | ICD-10-CM

## 2019-01-01 NOTE — Patient Instructions (Signed)
Hidradenitis . Loose, light clothing. Avoid heat, friction, shearing. Don't squeeze! . Wash clothes in perfume/dye free detergent . Gentle non-soap cleanser, wash gently w/ fingers (No cloth, loofah), can use antibacterial cleanser . Stop smoking! . Weight loss . Decrease/no dairy  Hidradenitis Suppurativa Hidradenitis suppurativa is a long-term (chronic) skin disease that starts with blocked sweat glands or hair follicles. Bacteria may grow in these blocked openings of your skin. Hidradenitis suppurativa is like a severe form of acne that develops in areas of your body where acne would be unusual. It is most likely to affect the areas of your body where skin rubs against skin and becomes moist. This includes your:  Underarms.  Groin.  Genital areas.  Buttocks.  Upper thighs.  Breasts. Hidradenitis suppurativa may start out with small pimples. The pimples can develop into deep sores that break open (rupture) and drain pus. Over time your skin may thicken and become scarred. Hidradenitis suppurativa cannot be passed from person to person. What are the causes? The exact cause of hidradenitis suppurativa is not known. This condition may be due to:  Female and female hormones. The condition is rare before and after puberty.  An overactive body defense system (immune system). Your immune system may overreact to the blocked hair follicles or sweat glands and cause swelling and pus-filled sores. What increases the risk? You may have a higher risk of hidradenitis suppurativa if you:  Are a woman.  Are between ages 110 and 45.  Have a family history of hidradenitis suppurativa.  Have a personal history of acne.  Are overweight.  Smoke.  Take the drug lithium. What are the signs or symptoms? The first signs of an outbreak are usually painful skin bumps that look like pimples. As the condition progresses:  Skin bumps may get bigger and grow deeper into the skin.  Bumps under the  skin may rupture and drain smelly pus.  Skin may become itchy and infected.  Skin may thicken and scar.  Drainage may continue through tunnels under the skin (fistulas).  Walking and moving your arms can become painful. How is this diagnosed? Your health care provider may diagnose hidradenitis suppurativa based on your medical history and your signs and symptoms. A physical exam will also be done. You may need to see a health care provider who specializes in skin diseases (dermatologist). You may also have tests done to confirm the diagnosis. These can include:  Swabbing a sample of pus or drainage from your skin so it can be sent to the lab and tested for infection.  Blood tests to check for infection. How is this treated? The same treatment will not work for everybody with hidradenitis suppurativa. Your treatment will depend on how severe your symptoms are. You may need to try several treatments to find what works best for you. Part of your treatment may include cleaning and bandaging (dressing) your wounds. You may also have to take medicines, such as the following:  Antibiotics.  Acne medicines.  Medicines to block or suppress the immune system.  A diabetes medicine (metformin) is sometimes used to treat this condition.  For women, birth control pills can sometimes help relieve symptoms. You may need surgery if you have a severe case of hidradenitis suppurativa that does not respond to medicine. Surgery may involve:  Using a laser to clear the skin and remove hair follicles.  Opening and draining deep sores.  Removing the areas of skin that are diseased and scarred. Follow these instructions  at home:  Learn as much as you can about your disease, and work closely with your health care providers.  Take medicines only as directed by your health care provider.  If you were prescribed an antibiotic medicine, finish it all even if you start to feel better.  If you are  overweight, losing weight may be very helpful. Try to reach and maintain a healthy weight.  Do not use any tobacco products, including cigarettes, chewing tobacco, or electronic cigarettes. If you need help quitting, ask your health care provider.  Do not shave the areas where you get hidradenitis suppurativa.  Do not wear deodorant.  Wear loose-fitting clothes.  Try not to overheat and get sweaty.  Take a daily bleach bath as directed by your health care provider.  Fill your bathtub halfway with water.  Pour in  cup of unscented household bleach.  Soak for 5-10 minutes.  Cover sore areas with a warm, clean washcloth (compress) for 5-10 minutes. Contact a health care provider if:  You have a flare-up of hidradenitis suppurativa.  You have chills or a fever.  You are having trouble controlling your symptoms at home. This information is not intended to replace advice given to you by your health care provider. Make sure you discuss any questions you have with your health care provider. Document Released: 11/30/2003 Document Revised: 09/23/2015 Document Reviewed: 07/18/2013 Elsevier Interactive Patient Education  2017 [employer].

## 2019-01-01 NOTE — Progress Notes (Signed)
   GYN VISIT Patient name: Angela [name] MRN [id number]  Date of birth: 26-Sep-1960 Chief Complaint:   bumps under fatty part of abd (starting to bleed)  History of Present Illness:   [name] is a 58 y.o. [id number] Asian female being seen today for report of abdominal bumps that bleed. Called to make appt 12mth ago, but bumps have since gone away. There were 4 or so under her panus. Never hurt.  Stopped using [employer] products and went back to [place] to see if it would help.  [facility] hasn't taken bp med yet this am  No LMP recorded. Patient has had a hysterectomy. The current method of family planning is status post hysterectomy for non-cancerous reasons per pt Last pap long time ago. Results were:  unsure Review of Systems:   Pertinent items are noted in HPI Denies fever/chills, dizziness, headaches, visual disturbances, fatigue, shortness of breath, chest pain, abdominal pain, vomiting, abnormal vaginal discharge/itching/odor/irritation, problems with periods, bowel movements, urination, or intercourse unless otherwise stated above.  Pertinent History Reviewed:  Reviewed past medical,surgical, social, obstetrical and family history.  Reviewed problem list, medications and allergies. Physical Assessment:   Vitals:   01/01/19 0855 01/01/19 0900  BP: (!) 170/101 (!) 148/98  Pulse: 91 80  Weight: (!) 322 lb 6.4 oz (146.2 kg)   Height: 5\' 4"  (1.626 m)   Body mass index is 55.34 kg/m.       Physical Examination:   General appearance: alert, well appearing, and in no distress  Mental status: alert, oriented to person, place, and time  Skin: warm & dry   Cardiovascular: normal heart rate noted  Respiratory: normal respiratory effort, no distress  Abdomen: soft, non-tender, ~four 1cm flush areas with flaky skin on periphery- appear to be healed boils, no active boils seen  Pelvic: examination not indicated  Extremities: no edema   No results found. However, due to the size of  the patient record, not all encounters were searched. Please check Results Review for a complete set of results.  Assessment & Plan:  1) Hidradenitis under panus> gave printed info on hidradenitis, keep area as clean and dry as possible, only use Dove (unable to use Dial). Let us know if come back and aren't resolving on their own  Meds: No orders of the defined types were placed in this encounter.   No orders of the defined types were placed in this encounter.   Return for prn.  Roma Schanz CNM, [facility] 01/01/2019 9:45 AM

## 2019-01-08 ENCOUNTER — Ambulatory Visit: Payer: Medicare Other | Admitting: Family Medicine

## 2019-01-08 DIAGNOSIS — N3090 Cystitis, unspecified without hematuria: Secondary | ICD-10-CM | POA: Diagnosis not present

## 2019-01-08 DIAGNOSIS — R300 Dysuria: Secondary | ICD-10-CM | POA: Diagnosis not present

## 2019-01-13 ENCOUNTER — Ambulatory Visit: Payer: Medicare Other | Admitting: Adult Health

## 2019-01-22 NOTE — Progress Notes (Signed)
Cardiology Office Note   Date:  01/23/2019   ID:  Angela [name], DOB 1961-04-02, MRN [id number]  PCP:  Angela Helper, MD  Cardiologist: Dr.  Margaretann Loveless  No chief complaint on file.    History of Present Illness: Angela [name] is a 58 y.o. female who presents for ongoing assessment and management of hypertension, and hyperlipidemia.  Patient had a cardiac catheterization 05/08/2018 which revealed proximal to mid LAD lesion of 10% and mid LAD lesion of 20% with no significant coronary obstructive disease elsewhere.  Other history includes Type 2 diabetes  When last seen in the office on 12/19/2018 the patient was complaining of elevated blood pressure which becomes more elevated throughout the day without associated chest pain. She also complains of mild shortness of breath and dyspnea on exertion.  The patient did not tolerate carvedilol in the past as this was causing her blood glucose to rise.  I changed her carvedilol to metoprolol to  25 mg twice daily to evaluate her heart rate and blood pressure response.  She is here for follow-up to assess her response to medication.  She was to continue statin therapy to keep LDL less than 70.   She comes today feeling much better, she is become very stringent on her diet, salt intake, and is increasing her activity.  Her daughter is now going to be her main caretaker.  Her daughter is very attentive and helps her with grocery shopping and taking medications as directed.  Patient's blood sugar and blood pressure have greatly improved since being seen last with medication changes as above.  The patient is excited that things are improving concerning her blood pressure.  She is hoping to lose weight.  She is now very motivated.  Past Medical History:  Diagnosis Date   Anxiety    Arthritis    Asthma    Coronary atherosclerosis of native coronary artery    Mild nonobstructive CAD by cardiac catheterization 2008 - Dr. Terrence Dupont   Degenerative disc  disease    Depression    Dysrhythmia    Essential hypertension, benign    Folliculitis 5/95/6387   GERD (gastroesophageal reflux disease)    H/O hiatal hernia    Helicobacter pylori gastritis 2008   Hypercholesterolemia    Hypertension    Hyperthyroidism    s/p radiation   Left-sided epistaxis 05/15/2018   Low back pain    Migraine    Nephrolithiasis    Recurring episodes since 2004   Severe obesity (BMI >= 40) (HCC)    Sleep apnea    STOP BANG SCORE 6no cpap used   Type 2 diabetes mellitus with diabetic neuropathy ([place])     Past Surgical History:  Procedure Laterality Date   [place]  2000 [facility]   INT HEMORRHOIDS/FISSURE   COLONOSCOPY  2003 [facility], CHANGE IN BOWEL HABITS   INT HEMORRHOIDS   COLONOSCOPY  2006 [facility]   INT HEMORRHOIDS   COLONOSCOPY  2007 [facility]   INT HEMORRHOIDS   CYSTOSCOPY WITH STENT PLACEMENT Left 07/25/2012   Procedure: CYSTOSCOPY, left retograde pyelogram WITH left ureteral  STENT PLACEMENT;  Surgeon: Claybon Jabs, MD;  Location: [facility];  Service: Urology;  Laterality: Left;   CYSTOSCOPY/RETROGRADE/URETEROSCOPY  12/04/2011  Procedure: CYSTOSCOPY/RETROGRADE/URETEROSCOPY;  Surgeon: Molli Hazard, MD;  Location: [facility];  Service: Urology;  Laterality: Right;   CYSTOSCOPY/RETROGRADE/URETEROSCOPY/STONE EXTRACTION WITH BASKET Left 09/03/2012   Procedure: CYSTOSCOPY/RETROGRADE pyelogram/digital URETEROSCOPY/STONE EXTRACTION WITH BASKET, left stent removal;  Surgeon: Molli Hazard, MD;  Location: [facility];  Service: Urology;  Laterality: Left;   ESOPHAGOGASTRODUODENOSCOPY  2008   [facility]:JSEGB hiatal hernia./Normal esophagus without evidence of Barrett's mass, stricture, erosion or ulceration./Normal duodenal bulb and second portion of the  duodenum./Diffuse erythema in the body and the antrum with occasional erosion.  Biopsies obtained via cold forceps to evaluate for H. pylori gastritis   FRACTURE SURGERY  1999   right clavicle   HOLMIUM LASER APPLICATION Left 05/06/1759   Procedure: HOLMIUM LASER APPLICATION;  Surgeon: Molli Hazard, MD;  Location: [facility];  Service: Urology;  Laterality: Left;   KNEE ARTHROSCOPY  10/04/2010,    right knee arthroscopy, dr Theda Sers   LEFT HEART CATH AND CORONARY ANGIOGRAPHY N/A 05/08/2018   Procedure: LEFT HEART CATH AND CORONARY ANGIOGRAPHY;  Surgeon: Troy Sine, MD;  Location: [place] [facility];  Service: Cardiovascular;  Laterality: N/A;   left knee arthroscopic surgery  1999   Left salphingectomy secondary to ectopic pregnancy  [place]   rotary cuff  Right 05/14/2014   [facility]   UPPER GASTROINTESTINAL ENDOSCOPY  2008 abd pain   H. pylori gastritis   UPPER GASTROINTESTINAL ENDOSCOPY  1996 [place]   PUD     Current Outpatient Medications  Medication Sig Dispense Refill   albuterol (PROAIR HFA) 108 (90 Base) MCG/ACT inhaler INHALE 2 PUFFS BY MOUTH EVERY 6-8HRS AS NEEDED (Patient taking differently: Inhale 2 puffs into the lungs every 6 (six) hours as needed for wheezing or shortness of breath. ) 8.5 Inhaler 1   amLODipine (NORVASC) 10 MG tablet Take 1 tablet (10 mg total) by mouth daily. 90 tablet 3   blood glucose meter kit and supplies Dispense based on patient and insurance preference. Once daily testing dx. e11.9 1 each 0   cloNIDine (CATAPRES - DOSED IN MG/24 HR) 0.3 mg/24hr patch APPLY 1 PATCH TO SKIN ONCE A WEEK 12 patch 0   cloNIDine (CATAPRES) 0.3 MG tablet TAKE 1 TABLET (0.3 MG TOTAL) BY MOUTH AT BEDTIME. 90 tablet 1   cyclobenzaprine (FLEXERIL) 10 MG tablet TAKE 1 TABLET BY MOUTH THREE TIMES A DAY 90 tablet 1   DULoxetine (CYMBALTA) 60 MG capsule TAKE 1 CAPSULE BY MOUTH TWICE A DAY (Patient taking differently: Take 60 mg by  mouth 2 (two) times daily. ) 180 capsule 0   ergocalciferol (VITAMIN D2) 50000 units capsule Take 1 capsule (50,000 Units total) by mouth once a week. One capsule once weekly 12 capsule 1   esomeprazole (NEXIUM) 40 MG capsule Take 1 capsule (40 mg total) by mouth daily. 90 capsule 3   fluticasone (FLONASE) 50 MCG/ACT nasal spray Place 2 sprays into both nostrils daily. (Patient taking differently: Place 2 sprays into both nostrils daily as needed for allergies. ) 16 g 6   gabapentin (NEURONTIN) 800 MG tablet TAKE 1 TABLET BY MOUTH FOUR TIMES A DAY 360 tablet 1   glipiZIDE (GLUCOTROL) 10 MG tablet Take 10 mg by mouth daily before breakfast.     glucose blood test strip Use as instructed four times daily testing dx e11.65 150 each 5   hydrOXYzine (ATARAX/VISTARIL) 10 MG tablet Take 10-30 mg by mouth at bedtime.   2   Insulin  Glargine (LANTUS SOLOSTAR) 100 UNIT/ML Solostar Pen Inject 40 Units into the skin at bedtime. 5 pen 2   Insulin Pen Needle (B-D ULTRAFINE III SHORT PEN) 31G X 8 MM MISC 1 each by Does not apply route as directed. 100 each 3   ipratropium-albuterol (DUONEB) 0.5-2.5 (3) MG/3ML SOLN Take 3 mLs by nebulization every 6 (six) hours as needed. (Patient taking differently: Take 3 mLs by nebulization every 6 (six) hours as needed (SOB). ) 360 mL 1   KLOR-CON M20 20 MEQ tablet TAKE 1 TABLET (20 MEQ TOTAL) BY MOUTH 2 (TWO) TIMES DAILY. 180 tablet 1   Lancets (ONETOUCH DELICA PLUS VZSMOL07E) MISC TEST ONCE DAILY 100 each 0   levothyroxine (SYNTHROID) 50 MCG tablet TAKE 1 TABLET BY MOUTH DAILY BEFORE BREAKFAST 90 tablet 0   loratadine (CLARITIN) 10 MG tablet Take 1 tablet (10 mg total) by mouth daily. 30 tablet 11   meclizine (ANTIVERT) 25 MG tablet Take 25 mg by mouth 3 (three) times daily as needed for dizziness.     metFORMIN (GLUCOPHAGE) 1000 MG tablet TAKE 1 TABLET BY MOUTH TWICE A DAY 180 tablet 1   metoprolol tartrate (LOPRESSOR) 25 MG tablet Take 1 tablet (25 mg total)  by mouth 2 (two) times daily. 180 tablet 3   montelukast (SINGULAIR) 10 MG tablet TAKE 1 TABLET (10 MG TOTAL) BY MOUTH AT BEDTIME. 90 tablet 1   nitroGLYCERIN (NITROSTAT) 0.4 MG SL tablet Place 1 tablet (0.4 mg total) under the tongue every 5 (five) minutes as needed for chest pain. 30 tablet 0   ondansetron (ZOFRAN) 4 MG tablet Take 1 tablet (4 mg total) by mouth every 8 (eight) hours as needed for nausea or vomiting. 20 tablet 0   rosuvastatin (CRESTOR) 40 MG tablet Take 20 mg by mouth daily.     SYMBICORT 160-4.5 MCG/ACT inhaler TAKE 2 PUFFS BY MOUTH TWICE A DAY 30.6 Inhaler 1   zolpidem (AMBIEN) 10 MG tablet Take 1 tablet (10 mg total) by mouth at bedtime. 30 tablet 5   No current facility-administered medications for this visit.     Allergies:   Morphine, Ace inhibitors, Aspirin, Losartan, Tapentadol, Tomato, and Adhesive [tape]    Social History:  The patient  reports that she has never smoked. She has never used smokeless tobacco. She reports that she does not drink alcohol or use drugs.   Family History:  The patient's family history includes Asthma in her mother; Cancer in her father and mother; Colon cancer in her father; Heart attack in her father; Heart disease in her mother; Hypertension in her mother.    ROS: All other systems are reviewed and negative. Unless otherwise mentioned in H&P    PHYSICAL EXAM: VS:  BP 132/88    Pulse (!) 103    Temp (!) 97.3 F (36.3 C)    Ht '5\' 4"'$  (1.626 m)    Wt (!) 325 lb (147.4 kg)    SpO2 96%    BMI 55.79 kg/m  , BMI Body mass index is 55.79 kg/m. GEN: Well nourished, well developed, in no acute distress, obese HEENT: normal Neck: no JVD, carotid bruits, or masses Cardiac: RRR; no murmurs, rubs, or gallops,no edema  Respiratory:  Clear to auscultation bilaterally, normal work of breathing GI: soft, nontender, nondistended, + BS MS: no deformity or atrophy Skin: warm and dry, no rash Neuro:  Strength and sensation are  intact Psych: euthymic mood, full affect   EKG: Normal sinus rhythm, heart rate of  70 bpm, possible left atrial enlargement.  Recent Labs: 05/06/2018: TSH 6.946 05/09/2018: Hemoglobin 12.3; Platelets 381 05/22/2018: Magnesium 1.8 12/19/2018: ALT 28; BUN 14; Creat 0.96; Potassium 4.1; Sodium 139    Lipid Panel    Component Value Date/Time   CHOL 90 08/28/2018 0903   TRIG 179 (H) 08/28/2018 0903   HDL 31 (L) 08/28/2018 0903   CHOLHDL 2.9 08/28/2018 0903   VLDL 16 05/06/2018 0335   LDLCALC 33 08/28/2018 0903      Wt Readings from Last 3 Encounters:  01/23/19 (!) 325 lb (147.4 kg)  01/01/19 (!) 322 lb 6.4 oz (146.2 kg)  12/31/18 (!) 322 lb 9.6 oz (146.3 kg)      Other studies Reviewed: Cardiac Cath 05/08/2018  Prox LAD to Mid LAD lesion is 10% stenosed.  Mid LAD lesion is 20% stenosed.  No significant coronary obstructive disease with mild 10 to 29% mid systolic bridging of the LAD; normal ramus intermediate, left circumflex, and dominant RCA.  LVEDP 14 mm. Previous echo Doppler study EF 60 to 65%.  RECOMMENDATION: Medical therapy. Suspect nonischemic etiology to the patient's chest pain. Recommend weight loss, optimal blood pressure control with target blood pressure less than 130/80. Lipid-lowering therapy with target LDL less than 70 in this diabetic female.   ASSESSMENT AND PLAN:  1.  Coronary artery disease: No significant coronary artery disease noted per cardiac catheterization in January 2020.  She is continued on secondary prevention with blood pressure control, statin therapy, and diabetic control.  She is motivated to lose weight and exercise.  Her daughter who cares for her is very attentive and make sure that her eating habits include low-cholesterol and low-sodium diet.  The patient is very pleased and motivated seeing the numbers that are improved.  She hopes to lose weight and become more active as time progresses.  She is encouraged in this.  We will  continue metoprolol 25 mg twice daily as directed.  2.  Hypertension: On multiple medications to include clonidine, amlodipine, metoprolol.  Blood pressure has significantly improved from last office visit with change from carvedilol to metoprolol.  She is no longer as dizzy as she used to be.  She is motivated to be more active and she has severely eliminated salt from her diet.  I have congratulated her on her progress and am pleased with her pressure today.  I have warned her with her newly changed diet that she will likely lose weight.  We will have to maintain careful close observation of her blood pressure to avoid hypotension if she loses at least 20 pounds or more.  She is aware that she will need to take her blood pressure daily and record.  I would become concerned if her blood pressure less than 110/60, or if she became dizzy or lightheaded with position changes.  She verbalizes understanding.  She is given a copy of the DASH diet to take home with her is helpful reference.  3:.  Insulin-dependent diabetes: Being followed by primary care, Dr. Dorris Fetch, and by dietitian.  Defer to them for medication adjustments.  4. Hypercholesterolemia: Continue statin therapy, goal of LDL less than 70. Current medicines are reviewed at length with the patient today.    Labs/ tests ordered today include: None, follow-up per PCP Phill Myron. West Pugh, ANP, [facility]   01/23/2019 8:54 AM    [facility] [address] Office [phone] Fax [phone]

## 2019-01-23 ENCOUNTER — Encounter: Payer: Self-pay | Admitting: Adult Health

## 2019-01-23 ENCOUNTER — Other Ambulatory Visit: Payer: Self-pay

## 2019-01-23 ENCOUNTER — Ambulatory Visit (INDEPENDENT_AMBULATORY_CARE_PROVIDER_SITE_OTHER): Payer: Medicare Other | Admitting: Adult Health

## 2019-01-23 VITALS — BP 132/88 | HR 103 | Temp 97.3°F | Ht 64.0 in | Wt 325.0 lb

## 2019-01-23 DIAGNOSIS — I10 Essential (primary) hypertension: Secondary | ICD-10-CM | POA: Diagnosis not present

## 2019-01-23 DIAGNOSIS — E7800 Pure hypercholesterolemia, unspecified: Secondary | ICD-10-CM | POA: Diagnosis not present

## 2019-01-23 DIAGNOSIS — I2510 Atherosclerotic heart disease of native coronary artery without angina pectoris: Secondary | ICD-10-CM | POA: Diagnosis not present

## 2019-01-23 NOTE — Patient Instructions (Signed)
Medication Instructions:  Continue current medications  If you need a refill on your cardiac medications before your next appointment, please call your pharmacy.  Labwork: None Ordered   Testing/Procedures: None Ordered   DASH Eating Plan DASH stands for "Dietary Approaches to Stop Hypertension." The DASH eating plan is a healthy eating plan that has been shown to reduce high blood pressure (hypertension). It may also reduce your risk for type 2 diabetes, heart disease, and stroke. The DASH eating plan may also help with weight loss. What are tips for following this plan?  General guidelines  Avoid eating more than 2,300 mg (milligrams) of salt (sodium) a day. If you have hypertension, you may need to reduce your sodium intake to 1,500 mg a day.  Limit alcohol intake to no more than 1 drink a day for nonpregnant women and 2 drinks a day for men. One drink equals 12 oz of beer, 5 oz of wine, or 1 oz of hard liquor.  Work with your health care provider to maintain a healthy body weight or to lose weight. Ask what an ideal weight is for you.  Get at least 30 minutes of exercise that causes your heart to beat faster (aerobic exercise) most days of the week. Activities may include walking, swimming, or biking.  Work with your health care provider or diet and nutrition specialist (dietitian) to adjust your eating plan to your individual calorie needs. Reading food labels   Check food labels for the amount of sodium per serving. Choose foods with less than 5 percent of the Daily Value of sodium. Generally, foods with less than 300 mg of sodium per serving fit into this eating plan.  To find whole grains, look for the word "whole" as the first word in the ingredient list. Shopping  Buy products labeled as "low-sodium" or "no salt added."  Buy fresh foods. Avoid canned foods and premade or frozen meals. Cooking  Avoid adding salt when cooking. Use salt-free seasonings or herbs instead  of table salt or sea salt. Check with your health care provider or pharmacist before using salt substitutes.  Do not fry foods. Cook foods using healthy methods such as baking, boiling, grilling, and broiling instead.  Cook with heart-healthy oils, such as olive, canola, soybean, or sunflower oil. Meal planning  Eat a balanced diet that includes: ? 5 or more servings of fruits and vegetables each day. At each meal, try to fill half of your plate with fruits and vegetables. ? Up to 6-8 servings of whole grains each day. ? Less than 6 oz of lean meat, poultry, or fish each day. A 3-oz serving of meat is about the same size as a deck of cards. One egg equals 1 oz. ? 2 servings of low-fat dairy each day. ? A serving of nuts, seeds, or beans 5 times each week. ? Heart-healthy fats. Healthy fats called Omega-3 fatty acids are found in foods such as flaxseeds and coldwater fish, like sardines, salmon, and mackerel.  Limit how much you eat of the following: ? Canned or prepackaged foods. ? Food that is high in trans fat, such as fried foods. ? Food that is high in saturated fat, such as fatty meat. ? Sweets, desserts, sugary drinks, and other foods with added sugar. ? Full-fat dairy products.  Do not salt foods before eating.  Try to eat at least 2 vegetarian meals each week.  Eat more home-cooked food and less restaurant, buffet, and fast food.  When eating  at a restaurant, ask that your food be prepared with less salt or no salt, if possible. What foods are recommended? The items listed may not be a complete list. Talk with your dietitian about what dietary choices are best for you. Grains Whole-grain or whole-wheat bread. Whole-grain or whole-wheat pasta. Brown rice. [place]. Bulgur. Whole-grain and low-sodium cereals. Pita bread. Low-fat, low-sodium crackers. Whole-wheat flour tortillas. Vegetables Fresh or frozen vegetables (raw, steamed, roasted, or grilled). Low-sodium or  reduced-sodium tomato and vegetable juice. Low-sodium or reduced-sodium tomato sauce and tomato paste. Low-sodium or reduced-sodium canned vegetables. Fruits All fresh, dried, or frozen fruit. Canned fruit in natural juice (without added sugar). Meat and other protein foods Skinless chicken or [place]. Ground chicken or [place]. Pork with fat trimmed off. Fish and seafood. Egg whites. Dried beans, peas, or lentils. Unsalted nuts, nut butters, and seeds. Unsalted canned beans. Lean cuts of beef with fat trimmed off. Low-sodium, lean deli meat. Dairy Low-fat (1%) or fat-free (skim) milk. Fat-free, low-fat, or reduced-fat cheeses. Nonfat, low-sodium ricotta or cottage cheese. Low-fat or nonfat yogurt. Low-fat, low-sodium cheese. Fats and oils Soft margarine without trans fats. Vegetable oil. Low-fat, reduced-fat, or light mayonnaise and salad dressings (reduced-sodium). Canola, safflower, olive, soybean, and sunflower oils. Avocado. Seasoning and other foods Herbs. Spices. Seasoning mixes without salt. Unsalted popcorn and pretzels. Fat-free sweets. What foods are not recommended? The items listed may not be a complete list. Talk with your dietitian about what dietary choices are best for you. Grains Baked goods made with fat, such as croissants, muffins, or some breads. Dry pasta or rice meal packs. Vegetables Creamed or fried vegetables. Vegetables in a cheese sauce. Regular canned vegetables (not low-sodium or reduced-sodium). Regular canned tomato sauce and paste (not low-sodium or reduced-sodium). Regular tomato and vegetable juice (not low-sodium or reduced-sodium). Angie Fava. Olives. Fruits Canned fruit in a light or heavy syrup. Fried fruit. Fruit in cream or butter sauce. Meat and other protein foods Fatty cuts of meat. Ribs. Fried meat. Berniece Salines. Sausage. Bologna and other processed lunch meats. Salami. Fatback. Hotdogs. Bratwurst. Salted nuts and seeds. Canned beans with added salt. Canned or  smoked fish. Whole eggs or egg yolks. Chicken or [place] with skin. Dairy Whole or 2% milk, cream, and half-and-half. Whole or full-fat cream cheese. Whole-fat or sweetened yogurt. Full-fat cheese. Nondairy creamers. Whipped toppings. Processed cheese and cheese spreads. Fats and oils Butter. Stick margarine. Lard. Shortening. Ghee. Bacon fat. Tropical oils, such as coconut, palm kernel, or palm oil. Seasoning and other foods Salted popcorn and pretzels. Onion salt, garlic salt, seasoned salt, table salt, and sea salt. Worcestershire sauce. Tartar sauce. Barbecue sauce. Teriyaki sauce. Soy sauce, including reduced-sodium. Steak sauce. Canned and packaged gravies. Fish sauce. Oyster sauce. Cocktail sauce. Horseradish that you find on the shelf. Ketchup. Mustard. Meat flavorings and tenderizers. Bouillon cubes. Hot sauce and Tabasco sauce. Premade or packaged marinades. Premade or packaged taco seasonings. Relishes. Regular salad dressings. Where to find more information:  National Heart, Lung, and [place]: https://wilson-eaton.com/  American Heart Association: www.heart.org Summary  The DASH eating plan is a healthy eating plan that has been shown to reduce high blood pressure (hypertension). It may also reduce your risk for type 2 diabetes, heart disease, and stroke.  With the DASH eating plan, you should limit salt (sodium) intake to 2,300 mg a day. If you have hypertension, you may need to reduce your sodium intake to 1,500 mg a day.  When on the DASH eating plan, aim to  eat more fresh fruits and vegetables, whole grains, lean proteins, low-fat dairy, and heart-healthy fats.  Work with your health care provider or diet and nutrition specialist (dietitian) to adjust your eating plan to your individual calorie needs. This information is not intended to replace advice given to you by your health care provider. Make sure you discuss any questions you have with your health care provider. Document  Released: 04/06/2011 Document Revised: 03/30/2017 Document Reviewed: 04/10/2016 Elsevier Patient Education  Jeannette: You will need a follow up appointment in 6 months.  Please call our office 2 months in advance to schedule this appointment.  You may see Elouise Munroe, MD or one of the following Advanced Practice Providers on your designated Care Team:   Rosaria Ferries, PA-C . Jory Sims, DNP, ANP     At [facility], you and your health needs are our priority.  As part of our continuing mission to provide you with exceptional heart care, we have created designated Provider Care Teams.  These Care Teams include your primary Cardiologist (physician) and Advanced Practice Providers (APPs -  Physician Assistants and Nurse Practitioners) who all work together to provide you with the care you need, when you need it.  Thank you for choosing [facility] at [facility]!!

## 2019-01-29 ENCOUNTER — Other Ambulatory Visit: Payer: Self-pay | Admitting: Family Medicine

## 2019-01-30 ENCOUNTER — Other Ambulatory Visit: Payer: Self-pay

## 2019-01-30 ENCOUNTER — Ambulatory Visit (INDEPENDENT_AMBULATORY_CARE_PROVIDER_SITE_OTHER): Payer: Medicare Other | Admitting: Family Medicine

## 2019-01-30 VITALS — BP 120/76 | HR 85 | Temp 97.4°F | Resp 15 | Ht 64.0 in | Wt 324.0 lb

## 2019-01-30 DIAGNOSIS — E1165 Type 2 diabetes mellitus with hyperglycemia: Secondary | ICD-10-CM | POA: Diagnosis not present

## 2019-01-30 DIAGNOSIS — R52 Pain, unspecified: Secondary | ICD-10-CM

## 2019-01-30 DIAGNOSIS — E1159 Type 2 diabetes mellitus with other circulatory complications: Secondary | ICD-10-CM | POA: Diagnosis not present

## 2019-01-30 DIAGNOSIS — I10 Essential (primary) hypertension: Secondary | ICD-10-CM

## 2019-01-30 DIAGNOSIS — Z23 Encounter for immunization: Secondary | ICD-10-CM | POA: Diagnosis not present

## 2019-01-30 DIAGNOSIS — S91212A Laceration without foreign body of left great toe with damage to nail, initial encounter: Secondary | ICD-10-CM

## 2019-01-30 DIAGNOSIS — Z794 Long term (current) use of insulin: Secondary | ICD-10-CM

## 2019-01-30 DIAGNOSIS — M62830 Muscle spasm of back: Secondary | ICD-10-CM

## 2019-01-30 MED ORDER — CEPHALEXIN 500 MG PO CAPS: 500.0000 mg | ORAL_CAPSULE | Freq: Two times a day (BID) | ORAL | 0 refills | Status: AC

## 2019-01-30 NOTE — Telephone Encounter (Signed)
Encounter not needed

## 2019-01-30 NOTE — Assessment & Plan Note (Signed)
Improving, managed by Endo [name] is reminded of the importance of commitment to daily physical activity for 30 minutes or more, as able and the need to limit carbohydrate intake to 30 to 60 grams per meal to help with blood sugar control.   The need to take medication as prescribed, test blood sugar as directed, and to call between visits if there is a concern that blood sugar is uncontrolled is also discussed.   [name] is reminded of the importance of daily foot exam, annual eye examination, and good blood sugar, blood pressure and cholesterol control.  Diabetic Labs Latest Ref Rng & Units 12/19/2018 08/28/2018 05/22/2018 05/09/2018 05/08/2018  HbA1c <5.7 % of total Hgb 11.9(H) >14.0(H) - - -  Microalbumin mg/dL - - - - -  Micro/Creat Ratio <30 mcg/mg creat - - - - -  Chol <200 mg/dL - 90 - - -  HDL > OR = 50 mg/dL - 31(L) - - -  Calc LDL mg/dL (calc) - 33 - - -  Triglycerides <150 mg/dL - 179(H) - - -  Creatinine 0.50 - 1.05 mg/dL 0.96 0.90 1.17(H) 0.81 0.80   BP/Weight 01/30/2019 01/23/2019 01/01/2019 12/31/2018 12/25/2018 [id number]  Systolic BP 123456 [id number] [id number] 123456 - [id number]  Diastolic BP 76 88 98 88 - 86 70  Wt. (Lbs) 324 325 322.4 322.6 310 310 326.04  BMI 55.61 55.79 55.34 55.37 53.21 53.21 55.96   Foot/eye exam completion dates Latest Ref Rng & Units 01/30/2019 02/07/2018  Eye Exam No Retinopathy - -  Foot Form Completion - Done Done

## 2019-01-30 NOTE — Assessment & Plan Note (Signed)
The patient's Controlled Substance registry is reviewed and compliance confirmed. Adequacy of  Pain control and level of function is assessed. Medication dosing is adjusted as deemed appropriate. Twelve weeks of medication is prescribed , patient signs for the script and is provided with a follow up appointment between 11 to 12 weeks .  

## 2019-01-30 NOTE — Patient Instructions (Addendum)
Follow-up in office with MD in 13 weeks call if you need me sooner.  5-day course of Keflex is prescribed for the cut under your left great toenail. You are referred to podiatry as well as to ophthalmology please make and  keep appointments.  Please schedule and get your mammogram this is coming this will soon be overdue.  Congratulations on excellent blood pressure and improvement in your blood sugar keep it up.  Please get fasting labs the same day you are getting your blood work in December for the endocrinologist.Lipid, cmp and eGFR  Thanks for choosing [facility], we consider it a privelige to serve you.

## 2019-01-31 ENCOUNTER — Encounter: Payer: Self-pay | Admitting: Family Medicine

## 2019-01-31 DIAGNOSIS — S91119A Laceration without foreign body of unspecified toe without damage to nail, initial encounter: Secondary | ICD-10-CM | POA: Insufficient documentation

## 2019-01-31 MED ORDER — OXYCODONE HCL ER 40 MG PO T12A: EXTENDED_RELEASE_TABLET | ORAL | 0 refills | Status: AC

## 2019-01-31 MED ORDER — OXYCODONE HCL ER 40 MG PO T12A: 40.0000 mg | EXTENDED_RELEASE_TABLET | Freq: Two times a day (BID) | ORAL | 0 refills | Status: AC

## 2019-01-31 MED ORDER — DIAZEPAM 5 MG PO TABS: ORAL_TABLET | ORAL | 5 refills | Status: DC

## 2019-01-31 MED ORDER — ZOLPIDEM TARTRATE 10 MG PO TABS: 10.0000 mg | ORAL_TABLET | Freq: Every day | ORAL | 5 refills | Status: DC

## 2019-01-31 NOTE — Assessment & Plan Note (Signed)
Chronic and unchanged, managed with muscle relaxants

## 2019-01-31 NOTE — Progress Notes (Signed)
Angela [name]     MRN: [id number]      DOB: [birth year]   HPI Angela [name] is here for follow up and re-evaluation of chronic medical conditions, medication management and review of any available recent lab and radiology data.  Preventive health is updated, specifically  Cancer screening and Immunization.   Reports improvement in her diabetes and I being diligent with diet Denies polyuria, polydipsia, blurred vision , or hypoglycemic episodes. Good support from her daughter The PT denies any adverse reactions to current medications since the last visit.  There are no new concerns.  There are no specific complaints   ROS Denies recent fever or chills. Denies sinus pressure, nasal congestion, ear pain or sore throat. Denies chest congestion, productive cough or wheezing. Denies chest pains, palpitations and leg swelling Denies abdominal pain, nausea, vomiting,diarrhea or constipation.   Denies dysuria, frequency, hesitancy or incontinence. C/o chronic  joint pain, swelling and limitation in mobility. Denies headaches, seizures, numbness, or tingling. Denies depression, anxiety or insomnia. Denies skin break down or rash.   PE  BP 120/76   Pulse 85   Temp (!) 97.4 F (36.3 C) (Temporal)   Resp 15   Ht 5\' 4"  (1.626 m)   Wt (!) 324 lb (147 kg)   SpO2 98%   BMI 55.61 kg/m   Patient alert and oriented and in no cardiopulmonary distress.  HEENT: No facial asymmetry, EOMI,   oropharynx pink and moist.  Neck supple no JVD, no mass.  Chest: Clear to auscultation bilaterally.  CVS: S1, S2 no murmurs, no S3.Regular rate.  ABD: Soft non tender.   Ext: No edema  MS: decreased  ROM spine,  hips and knees.normal in shoulders  Skin: Intact, no ulcerations or rash noted.  Psych: Good eye contact, normal affect. Memory intact not anxious or depressed appearing.  CNS: CN 2-12 intact, power,  normal throughout.no focal deficits noted.   Assessment & Plan  Encounter for pain  management The patient's Controlled Substance registry is reviewed and compliance confirmed. Adequacy of  Pain control and level of function is assessed. Medication dosing is adjusted as deemed appropriate. Twelve weeks of medication is prescribed , patient signs for the script and is provided with a follow up appointment between 11 to 12 weeks .   Type 2 diabetes mellitus with hyperglycemia (HCC) Improving, managed by Endo Angela [name] is reminded of the importance of commitment to daily physical activity for 30 minutes or more, as able and the need to limit carbohydrate intake to 30 to 60 grams per meal to help with blood sugar control.   The need to take medication as prescribed, test blood sugar as directed, and to call between visits if there is a concern that blood sugar is uncontrolled is also discussed.   Angela [name] is reminded of the importance of daily foot exam, annual eye examination, and good blood sugar, blood pressure and cholesterol control.  Diabetic Labs Latest Ref Rng & Units 12/19/2018 08/28/2018 05/22/2018 05/09/2018 05/08/2018  HbA1c <5.7 % of total Hgb 11.9(H) >14.0(H) - - -  Microalbumin mg/dL - - - - -  Micro/Creat Ratio <30 mcg/mg creat - - - - -  Chol <200 mg/dL - 90 - - -  HDL > OR = 50 mg/dL - 31(L) - - -  Calc LDL mg/dL (calc) - 33 - - -  Triglycerides <150 mg/dL - 179(H) - - -  Creatinine 0.50 - 1.05 mg/dL 0.96 0.90 1.17(H) 0.81 0.80  BP/Weight 01/30/2019 01/23/2019 01/01/2019 12/31/2018 12/25/2018 [id number] [id number]  Systolic BP 123456 Q000111Q 123456 123456 - [id number]  Diastolic BP 76 88 98 88 - 86 70  Wt. (Lbs) 324 325 322.4 322.6 310 310 326.04  BMI 55.61 55.79 55.34 55.37 53.21 53.21 55.96   Foot/eye exam completion dates Latest Ref Rng & Units 01/30/2019 02/07/2018  Eye Exam No Retinopathy - -  Foot Form Completion - Done Done         Essential hypertension, benign Controlled, no change in medication DASH diet and commitment to daily physical activity for a minimum of  30 minutes discussed and encouraged, as a part of hypertension management. The importance of attaining a healthy weight is also discussed.  BP/Weight 01/30/2019 01/23/2019 01/01/2019 12/31/2018 12/25/2018 [id number] [id number]  Systolic BP 123456 Q000111Q 123456 123456 - [id number]  Diastolic BP 76 88 98 88 - 86 70  Wt. (Lbs) 324 325 322.4 322.6 310 310 326.04  BMI 55.61 55.79 55.34 55.37 53.21 53.21 55.96       Morbid obesity (HCC)  Patient re-educated about  the importance of commitment to a  minimum of 150 minutes of exercise per week as able.  The importance of healthy food choices with portion control discussed, as well as eating regularly and within a 12 hour window most days. The need to choose "clean , green" food 50 to 75% of the time is discussed, as well as to make water the primary drink and set a goal of 64 ounces water daily.    Weight /BMI 01/30/2019 01/23/2019 01/01/2019  WEIGHT 324 lb 325 lb 322 lb 6.4 oz  HEIGHT 5\' 4"  5\' 4"  5\' 4"   BMI 55.61 kg/m2 55.79 kg/m2 55.34 kg/m2      Uncontrolled type 2 diabetes mellitus with hyperglycemia (HCC)  improvement , managed by Endo, congratulated and encouraged Angela [name] is reminded of the importance of commitment to daily physical activity for 30 minutes or more, as able and the need to limit carbohydrate intake to 30 to 60 grams per meal to help with blood sugar control.   The need to take medication as prescribed, test blood sugar as directed, and to call between visits if there is a concern that blood sugar is uncontrolled is also discussed.   Angela [name] is reminded of the importance of daily foot exam, annual eye examination, and good blood sugar, blood pressure and cholesterol control.  Diabetic Labs Latest Ref Rng & Units 12/19/2018 08/28/2018 05/22/2018 05/09/2018 05/08/2018  HbA1c <5.7 % of total Hgb 11.9(H) >14.0(H) - - -  Microalbumin mg/dL - - - - -  Micro/Creat Ratio <30 mcg/mg creat - - - - -  Chol <200 mg/dL - 90 - - -  HDL > OR = 50 mg/dL -  31(L) - - -  Calc LDL mg/dL (calc) - 33 - - -  Triglycerides <150 mg/dL - 179(H) - - -  Creatinine 0.50 - 1.05 mg/dL 0.96 0.90 1.17(H) 0.81 0.80   BP/Weight 01/30/2019 01/23/2019 01/01/2019 12/31/2018 12/25/2018 Q000111Q [id number]  Systolic BP 123456 Q000111Q 123456 123456 - [id number]  Diastolic BP 76 88 98 88 - 86 70  Wt. (Lbs) 324 325 322.4 322.6 310 310 326.04  BMI 55.61 55.79 55.34 55.37 53.21 53.21 55.96   Foot/eye exam completion dates Latest Ref Rng & Units 01/30/2019 02/07/2018  Eye Exam No Retinopathy - -  Foot Form Completion - Done Done        Back muscle spasm Chronic and unchanged, managed  with muscle relaxants  Toe laceration Minor damage to left great toe under nailbed, keflex prescribed

## 2019-01-31 NOTE — Assessment & Plan Note (Signed)
Controlled, no change in medication DASH diet and commitment to daily physical activity for a minimum of 30 minutes discussed and encouraged, as a part of hypertension management. The importance of attaining a healthy weight is also discussed.  BP/Weight 01/30/2019 01/23/2019 01/01/2019 12/31/2018 12/25/2018 Q000111Q [id number]  Systolic BP 123456 Q000111Q 123456 123456 - [id number]  Diastolic BP 76 88 98 88 - 86 70  Wt. (Lbs) 324 325 322.4 322.6 310 310 326.04  BMI 55.61 55.79 55.34 55.37 53.21 53.21 55.96

## 2019-01-31 NOTE — Assessment & Plan Note (Signed)
Minor damage to left great toe under nailbed, keflex prescribed

## 2019-01-31 NOTE — Assessment & Plan Note (Signed)
improvement , managed by Endo, congratulated and encouraged Angela [name] is reminded of the importance of commitment to daily physical activity for 30 minutes or more, as able and the need to limit carbohydrate intake to 30 to 60 grams per meal to help with blood sugar control.   The need to take medication as prescribed, test blood sugar as directed, and to call between visits if there is a concern that blood sugar is uncontrolled is also discussed.   Angela [name] is reminded of the importance of daily foot exam, annual eye examination, and good blood sugar, blood pressure and cholesterol control.  Diabetic Labs Latest Ref Rng & Units 12/19/2018 08/28/2018 05/22/2018 05/09/2018 05/08/2018  HbA1c <5.7 % of total Hgb 11.9(H) >14.0(H) - - -  Microalbumin mg/dL - - - - -  Micro/Creat Ratio <30 mcg/mg creat - - - - -  Chol <200 mg/dL - 90 - - -  HDL > OR = 50 mg/dL - 31(L) - - -  Calc LDL mg/dL (calc) - 33 - - -  Triglycerides <150 mg/dL - 179(H) - - -  Creatinine 0.50 - 1.05 mg/dL 0.96 0.90 1.17(H) 0.81 0.80   BP/Weight 01/30/2019 01/23/2019 01/01/2019 12/31/2018 12/25/2018 Q000111Q [id number]  Systolic BP 123456 Q000111Q 123456 123456 - [id number]  Diastolic BP 76 88 98 88 - 86 70  Wt. (Lbs) 324 325 322.4 322.6 310 310 326.04  BMI 55.61 55.79 55.34 55.37 53.21 53.21 55.96   Foot/eye exam completion dates Latest Ref Rng & Units 01/30/2019 02/07/2018  Eye Exam No Retinopathy - -  Foot Form Completion - Done Done

## 2019-01-31 NOTE — Assessment & Plan Note (Signed)
  Patient re-educated about  the importance of commitment to a  minimum of 150 minutes of exercise per week as able.  The importance of healthy food choices with portion control discussed, as well as eating regularly and within a 12 hour window most days. The need to choose "clean , green" food 50 to 75% of the time is discussed, as well as to make water the primary drink and set a goal of 64 ounces water daily.    Weight /BMI 01/30/2019 01/23/2019 01/01/2019  WEIGHT 324 lb 325 lb 322 lb 6.4 oz  HEIGHT 5\' 4"  5\' 4"  5\' 4"   BMI 55.61 kg/m2 55.79 kg/m2 55.34 kg/m2

## 2019-02-17 DIAGNOSIS — L280 Lichen simplex chronicus: Secondary | ICD-10-CM | POA: Diagnosis not present

## 2019-02-17 DIAGNOSIS — L981 Factitial dermatitis: Secondary | ICD-10-CM | POA: Diagnosis not present

## 2019-02-17 DIAGNOSIS — L400 Psoriasis vulgaris: Secondary | ICD-10-CM | POA: Diagnosis not present

## 2019-02-17 DIAGNOSIS — L819 Disorder of pigmentation, unspecified: Secondary | ICD-10-CM | POA: Diagnosis not present

## 2019-02-22 ENCOUNTER — Other Ambulatory Visit: Payer: Self-pay | Admitting: Family Medicine

## 2019-02-22 DIAGNOSIS — M62830 Muscle spasm of back: Secondary | ICD-10-CM

## 2019-03-01 ENCOUNTER — Other Ambulatory Visit: Payer: Self-pay | Admitting: Family Medicine

## 2019-03-05 ENCOUNTER — Other Ambulatory Visit: Payer: Self-pay | Admitting: "Endocrinology

## 2019-03-05 ENCOUNTER — Other Ambulatory Visit: Payer: Self-pay | Admitting: Family Medicine

## 2019-03-08 ENCOUNTER — Other Ambulatory Visit: Payer: Self-pay | Admitting: Family Medicine

## 2019-03-11 ENCOUNTER — Telehealth: Payer: Self-pay

## 2019-03-11 ENCOUNTER — Other Ambulatory Visit: Payer: Self-pay

## 2019-03-11 MED ORDER — METFORMIN HCL 1000 MG PO TABS: 1000.0000 mg | ORAL_TABLET | Freq: Two times a day (BID) | ORAL | 1 refills | Status: DC

## 2019-03-11 NOTE — Telephone Encounter (Signed)
Trying to touch base with patient and see if Dr.Nida still follows her for diabetes. If so, he will need to refill her glipizide.

## 2019-03-17 ENCOUNTER — Ambulatory Visit (INDEPENDENT_AMBULATORY_CARE_PROVIDER_SITE_OTHER): Payer: Medicare Other | Admitting: Podiatry

## 2019-03-17 ENCOUNTER — Other Ambulatory Visit: Payer: Self-pay

## 2019-03-17 ENCOUNTER — Encounter: Payer: Self-pay | Admitting: Podiatry

## 2019-03-17 DIAGNOSIS — E1142 Type 2 diabetes mellitus with diabetic polyneuropathy: Secondary | ICD-10-CM | POA: Diagnosis not present

## 2019-03-17 DIAGNOSIS — M79675 Pain in left toe(s): Secondary | ICD-10-CM

## 2019-03-17 DIAGNOSIS — B351 Tinea unguium: Secondary | ICD-10-CM | POA: Diagnosis not present

## 2019-03-17 DIAGNOSIS — M79674 Pain in right toe(s): Secondary | ICD-10-CM

## 2019-03-17 DIAGNOSIS — L84 Corns and callosities: Secondary | ICD-10-CM | POA: Diagnosis not present

## 2019-03-17 DIAGNOSIS — Q828 Other specified congenital malformations of skin: Secondary | ICD-10-CM | POA: Diagnosis not present

## 2019-03-17 NOTE — Patient Instructions (Signed)
Diabetes Mellitus and Foot Care Foot care is an important part of your health, especially when you have diabetes. Diabetes may cause you to have problems because of poor blood flow (circulation) to your feet and legs, which can cause your skin to:  Become thinner and drier.  Break more easily.  Heal more slowly.  Peel and crack. You may also have nerve damage (neuropathy) in your legs and feet, causing decreased feeling in them. This means that you may not notice minor injuries to your feet that could lead to more serious problems. Noticing and addressing any potential problems early is the best way to prevent future foot problems. How to care for your feet Foot hygiene  Wash your feet daily with warm water and mild soap. Do not use hot water. Then, pat your feet and the areas between your toes until they are completely dry. Do not soak your feet as this can dry your skin.  Trim your toenails straight across. Do not dig under them or around the cuticle. File the edges of your nails with an emery board or nail file.  Apply a moisturizing lotion or petroleum jelly to the skin on your feet and to dry, brittle toenails. Use lotion that does not contain alcohol and is unscented. Do not apply lotion between your toes. Shoes and socks  Wear clean socks or stockings every day. Make sure they are not too tight. Do not wear knee-high stockings since they may decrease blood flow to your legs.  Wear shoes that fit properly and have enough cushioning. Always look in your shoes before you put them on to be sure there are no objects inside.  To break in new shoes, wear them for just a few hours a day. This prevents injuries on your feet. Wounds, scrapes, corns, and calluses  Check your feet daily for blisters, cuts, bruises, sores, and redness. If you cannot see the bottom of your feet, use a mirror or ask someone for help.  Do not cut corns or calluses or try to remove them with medicine.  If you  find a minor scrape, cut, or break in the skin on your feet, keep it and the skin around it clean and dry. You may clean these areas with mild soap and water. Do not clean the area with peroxide, alcohol, or iodine.  If you have a wound, scrape, corn, or callus on your foot, look at it several times a day to make sure it is healing and not infected. Check for: ? Redness, swelling, or pain. ? Fluid or blood. ? Warmth. ? Pus or a bad smell. General instructions  Do not cross your legs. This may decrease blood flow to your feet.  Do not use heating pads or hot water bottles on your feet. They may burn your skin. If you have lost feeling in your feet or legs, you may not know this is happening until it is too late.  Protect your feet from hot and cold by wearing shoes, such as at the beach or on hot pavement.  Schedule a complete foot exam at least once a year (annually) or more often if you have foot problems. If you have foot problems, report any cuts, sores, or bruises to your health care provider immediately. Contact a health care provider if:  You have a medical condition that increases your risk of infection and you have any cuts, sores, or bruises on your feet.  You have an injury that is not   healing.  You have redness on your legs or feet.  You feel burning or tingling in your legs or feet.  You have pain or cramps in your legs and feet.  Your legs or feet are numb.  Your feet always feel cold.  You have pain around a toenail. Get help right away if:  You have a wound, scrape, corn, or callus on your foot and: ? You have pain, swelling, or redness that gets worse. ? You have fluid or blood coming from the wound, scrape, corn, or callus. ? Your wound, scrape, corn, or callus feels warm to the touch. ? You have pus or a bad smell coming from the wound, scrape, corn, or callus. ? You have a fever. ? You have a red line going up your leg. Summary  Check your feet every day  for cuts, sores, red spots, swelling, and blisters.  Moisturize feet and legs daily.  Wear shoes that fit properly and have enough cushioning.  If you have foot problems, report any cuts, sores, or bruises to your health care provider immediately.  Schedule a complete foot exam at least once a year (annually) or more often if you have foot problems. This information is not intended to replace advice given to you by your health care provider. Make sure you discuss any questions you have with your health care provider. Document Released: 04/14/2000 Document Revised: 05/30/2017 Document Reviewed: 05/19/2016 Elsevier Patient Education  2020 [employer]  

## 2019-03-18 ENCOUNTER — Telehealth: Payer: Self-pay

## 2019-03-18 NOTE — Telephone Encounter (Signed)
telephone visit , in office or lab  CCUA with reflex c/s

## 2019-03-18 NOTE — Telephone Encounter (Signed)
Please call the pt regarding UTI concerns [phone]

## 2019-03-18 NOTE — Telephone Encounter (Signed)
Pt states that she is having burning when she urinates again x 2 days and said that you told her back in Oct that she would not need to leave a specimen the next time. Unsure what she was talking about. Does this need to be a visit with in office specimen?

## 2019-03-18 NOTE — Telephone Encounter (Signed)
Can she be worked in with Jarrett Soho this am. We are full

## 2019-03-19 NOTE — Telephone Encounter (Signed)
Called pt she said the Dr in [place] had sent her in some medication. She does not need an appointment

## 2019-03-23 NOTE — Progress Notes (Signed)
Subjective: Angela [name] presents today referred by Fayrene Helper, MD for diabetic foot evaluation.  Patient denies any history of foot wounds.  Today, patient c/o of painful, discolored, thick toenails which interfere with daily activities.  Pain is aggravated when wearing enclosed shoe gear.   Past Medical History:  Diagnosis Date  . Anxiety   . Arthritis   . Asthma   . Coronary atherosclerosis of native coronary artery    Mild nonobstructive CAD by cardiac catheterization 2008 - Dr. Terrence Dupont  . Degenerative disc disease   . Depression   . Dysrhythmia   . Essential hypertension, benign   . Folliculitis 0/25/8527  . GERD (gastroesophageal reflux disease)   . H/O hiatal hernia   . Helicobacter pylori gastritis 2008  . Hypercholesterolemia   . Hypertension   . Hyperthyroidism    s/p radiation  . Left-sided epistaxis 05/15/2018  . Low back pain   . Migraine   . Nephrolithiasis    Recurring episodes since 2004  . Severe obesity (BMI >= 40) (HCC)   . Sleep apnea    STOP BANG SCORE 6no cpap used  . Type 2 diabetes mellitus with diabetic neuropathy [facility])     Patient Active Problem List   Diagnosis Date Noted  . Toe laceration 01/31/2019  . Uncontrolled type 2 diabetes mellitus with hyperglycemia ([place]) 09/05/2018  . Hypothyroidism 09/05/2018  . Type 2 diabetes mellitus with hyperglycemia ([place]) 09/04/2018  . Polyuria 08/26/2018  . Chronic pain 05/06/2018  . Transaminitis 03/09/2018  . Contraindication to statin medication 03/09/2018  . Syncope 10/14/2017  . Elevated TSH 10/14/2017  . Insomnia due to anxiety and fear 02/11/2017  . Encounter for pain management 11/09/2016  . Chronic wrist pain, left 11/06/2016  . Chronic left hip pain 11/06/2016  . Mass of right ovary 09/04/2016  . FH: colon cancer 09/01/2016  . Numbness of left hand 08/21/2016  . Allergic rhinitis 12/05/2015  . Upper airway cough syndrome 09/19/2015  . Back muscle spasm 09/14/2015  . Psoriasis  09/14/2015  . Knee pain, right 03/29/2015  . Cervical neck pain with evidence of disc disease 04/13/2014  . Coronary atherosclerosis of native coronary artery 05/26/2013  . Hemorrhoid 04/08/2012  . Vitamin D deficiency 04/04/2012  . Nephrolithiasis 12/10/2011  . Sleep apnea syndrome 12/05/2011  . GERD (gastroesophageal reflux disease) 08/16/2011  . Back pain 11/03/2010  . Mixed hyperlipidemia 08/11/2008  . Morbid obesity ([place]) 01/06/2008  . Type 2 diabetes mellitus with vascular disease ([place]) 05/05/2006  . Depression with anxiety 05/05/2006  . Essential hypertension, benign 05/05/2006  . Asthma 05/05/2006  . Helicobacter pylori gastritis 05/01/2006    Past Surgical History:  Procedure Laterality Date  . ABDOMINAL HYSTERECTOMY    . BACK SURGERY  1993  . BACK SURGERY  1994  . BACK SURGERY  2002  . BACK SURGERY  2011   [place] Left   . CHOLECYSTECTOMY  1996  . COLONOSCOPY  2000 [facility]   INT HEMORRHOIDS/FISSURE  . COLONOSCOPY  2003 [facility], CHANGE IN BOWEL HABITS   INT HEMORRHOIDS  . COLONOSCOPY  2006 [facility]   INT HEMORRHOIDS  . COLONOSCOPY  2007 [facility]   INT HEMORRHOIDS  . CYSTOSCOPY WITH STENT PLACEMENT Left 07/25/2012   Procedure: CYSTOSCOPY, left retograde pyelogram WITH left ureteral  STENT PLACEMENT;  Surgeon: Claybon Jabs, MD;  Location: [facility];  Service: Urology;  Laterality: Left;  . CYSTOSCOPY/RETROGRADE/URETEROSCOPY  12/04/2011   Procedure: CYSTOSCOPY/RETROGRADE/URETEROSCOPY;  Surgeon: Quillian Quince  Julieanne Cotton, MD;  Location: [facility];  Service: Urology;  Laterality: Right;  . CYSTOSCOPY/RETROGRADE/URETEROSCOPY/STONE EXTRACTION WITH BASKET Left 09/03/2012   Procedure: CYSTOSCOPY/RETROGRADE pyelogram/digital URETEROSCOPY/STONE EXTRACTION WITH BASKET, left stent removal;  Surgeon: Molli Hazard, MD;  Location: [facility];  Service: Urology;  Laterality: Left;  . ESOPHAGOGASTRODUODENOSCOPY  2008   YJE:HUDJS hiatal hernia./Normal esophagus without  evidence of Barrett's mass, stricture, erosion or ulceration./Normal duodenal bulb and second portion of the duodenum./Diffuse erythema in the body and the antrum with occasional erosion.  Biopsies obtained via cold forceps to evaluate for H. pylori gastritis  . FRACTURE SURGERY  1999   right clavicle  . HOLMIUM LASER APPLICATION Left 01/05/262   Procedure: HOLMIUM LASER APPLICATION;  Surgeon: Molli Hazard, MD;  Location: [facility];  Service: Urology;  Laterality: Left;  . KNEE ARTHROSCOPY  10/04/2010,    right knee arthroscopy, dr Theda Sers  . LEFT HEART CATH AND CORONARY ANGIOGRAPHY N/A 05/08/2018   Procedure: LEFT HEART CATH AND CORONARY ANGIOGRAPHY;  Surgeon: Troy Sine, MD;  Location: [facility];  Service: Cardiovascular;  Laterality: N/A;  . left knee arthroscopic surgery  1999  . Left salphingectomy secondary to ectopic pregnancy  1991  . PARTIAL HYSTERECTOMY  1991  . rotary cuff  Right 05/14/2014   [facility]  . UPPER GASTROINTESTINAL ENDOSCOPY  2008 abd pain   H. pylori gastritis  . UPPER GASTROINTESTINAL ENDOSCOPY  1996 [facility] [place]   PUD    Current Outpatient Medications on File Prior to Visit  Medication Sig Dispense Refill  . carvedilol (COREG) 12.5 MG tablet TAKE 1 TABLET (12.5 MG TOTAL) BY MOUTH 2 (TWO) TIMES DAILY.    . hydrALAZINE (APRESOLINE) 25 MG tablet Take by mouth.    Marland Kitchen albuterol (PROAIR HFA) 108 (90 Base) MCG/ACT inhaler INHALE 2 PUFFS BY MOUTH EVERY 6-8HRS AS NEEDED (Patient taking differently: Inhale 2 puffs into the lungs every 6 (six) hours as needed for wheezing or shortness of breath. ) 8.5 Inhaler 1  . amLODipine (NORVASC) 10 MG tablet Take 1 tablet (10 mg total) by mouth daily. 90 tablet 3  . blood glucose meter kit and supplies Dispense based on patient and insurance preference. Once daily testing dx. e11.9 1 each 0  . calcipotriene (DOVONOX) 0.005 % ointment APPLY TO AFFECTED AREA OF LEGS AND ELBOWS DAILY    . cloNIDine (CATAPRES - DOSED IN MG/24  HR) 0.3 mg/24hr patch APPLY 1 PATCH TO SKIN ONCE A WEEK 12 patch 0  . cloNIDine (CATAPRES) 0.3 MG tablet TAKE 1 TABLET (0.3 MG TOTAL) BY MOUTH AT BEDTIME. 90 tablet 1  . cyclobenzaprine (FLEXERIL) 10 MG tablet TAKE 1 TABLET BY MOUTH THREE TIMES A DAY 90 tablet 1  . diazepam (VALIUM) 5 MG tablet Take one tablet by mouth three times daily for back spasm 90 tablet 5  . DULoxetine (CYMBALTA) 60 MG capsule TAKE 1 CAPSULE BY MOUTH TWICE A DAY (Patient taking differently: Take 60 mg by mouth 2 (two) times daily. ) 180 capsule 0  . ergocalciferol (VITAMIN D2) 50000 units capsule Take 1 capsule (50,000 Units total) by mouth once a week. One capsule once weekly 12 capsule 1  . esomeprazole (NEXIUM) 40 MG capsule Take 1 capsule (40 mg total) by mouth daily. 90 capsule 3  . fluconazole (DIFLUCAN) 150 MG tablet TAKE 1 TABLET BY MOUTH AS ONE DOSE    . fluticasone (FLONASE) 50 MCG/ACT nasal spray Place 2 sprays into both nostrils daily. (Patient taking differently: Place  2 sprays into both nostrils daily as needed for allergies. ) 16 g 6  . gabapentin (NEURONTIN) 800 MG tablet TAKE 1 TABLET BY MOUTH FOUR TIMES A DAY 360 tablet 1  . glipiZIDE (GLUCOTROL) 10 MG tablet Take 10 mg by mouth daily before breakfast.    . glucose blood test strip Use as instructed four times daily testing dx e11.65 150 each 5  . hydrOXYzine (ATARAX/VISTARIL) 10 MG tablet Take 10-30 mg by mouth at bedtime.   2  . Insulin Glargine (LANTUS SOLOSTAR) 100 UNIT/ML Solostar Pen Inject 40 Units into the skin at bedtime. 5 pen 2  . Insulin Pen Needle (B-D ULTRAFINE III SHORT PEN) 31G X 8 MM MISC 1 each by Does not apply route as directed. 100 each 3  . ipratropium-albuterol (DUONEB) 0.5-2.5 (3) MG/3ML SOLN Take 3 mLs by nebulization every 6 (six) hours as needed. (Patient taking differently: Take 3 mLs by nebulization every 6 (six) hours as needed (SOB). ) 360 mL 1  . KLOR-CON M20 20 MEQ tablet TAKE 1 TABLET BY MOUTH TWICE A DAY 180 tablet 1  .  Lancets (ONETOUCH DELICA PLUS [id number]) MISC TEST ONCE DAILY 100 each 0  . levothyroxine (SYNTHROID) 50 MCG tablet TAKE 1 TABLET BY MOUTH DAILY BEFORE BREAKFAST 90 tablet 0  . loratadine (CLARITIN) 10 MG tablet Take 1 tablet (10 mg total) by mouth daily. 30 tablet 11  . meclizine (ANTIVERT) 25 MG tablet Take 25 mg by mouth 3 (three) times daily as needed for dizziness.    . metFORMIN (GLUCOPHAGE) 1000 MG tablet Take 1 tablet (1,000 mg total) by mouth 2 (two) times daily. 180 tablet 1  . metoprolol tartrate (LOPRESSOR) 25 MG tablet Take 1 tablet (25 mg total) by mouth 2 (two) times daily. 180 tablet 3  . montelukast (SINGULAIR) 10 MG tablet TAKE 1 TABLET (10 MG TOTAL) BY MOUTH AT BEDTIME. 90 tablet 1  . nitroGLYCERIN (NITROSTAT) 0.4 MG SL tablet Place 1 tablet (0.4 mg total) under the tongue every 5 (five) minutes as needed for chest pain. 30 tablet 0  . ondansetron (ZOFRAN) 4 MG tablet Take 1 tablet (4 mg total) by mouth every 8 (eight) hours as needed for nausea or vomiting. 20 tablet 0  . oxyCODONE (OXYCONTIN) 40 mg 12 hr tablet Take 1 tablet (40 mg total) by mouth every 12 (twelve) hours. 60 tablet 0  . [START ON 04/14/2019] oxyCODONE (OXYCONTIN) 40 mg 12 hr tablet Take one tablet by mouth two times daily for back pain 60 tablet 0  . rosuvastatin (CRESTOR) 40 MG tablet TAKE 1 TABLET BY MOUTH EVERY DAY 90 tablet 1  . SYMBICORT 160-4.5 MCG/ACT inhaler TAKE 2 PUFFS BY MOUTH TWICE A DAY 30.6 Inhaler 1  . triamterene-hydrochlorothiazide (MAXZIDE) 75-50 MG tablet Take 1 tablet by mouth daily.    Marland Kitchen zolpidem (AMBIEN) 10 MG tablet Take 1 tablet (10 mg total) by mouth at bedtime. 30 tablet 5  . zolpidem (AMBIEN) 10 MG tablet Take 1 tablet (10 mg total) by mouth at bedtime. 30 tablet 5   No current facility-administered medications on file prior to visit.      Allergies  Allergen Reactions  . Morphine Other (See Comments)    Reaction with esophagus, unable to swallow.   . Ace Inhibitors Cough  .  Aspirin Other (See Comments)    Reaction with esophagus, unable to swallow.   . Losartan Cough  . Tapentadol Other (See Comments)    Nausea, increased sleepiness, h/a  .  Tomato Other (See Comments)    Acid reflux due to acid in tomato  . Adhesive [Tape] Rash    Social History   Occupational History  . Occupation: Disable   Tobacco Use  . Smoking status: Never Smoker  . Smokeless tobacco: Never Used  Substance and Sexual Activity  . Alcohol use: No  . Drug use: No  . Sexual activity: Not Currently    Partners: Male    Birth control/protection: Surgical    Comment: hyst    Family History  Problem Relation Age of Onset  . Heart disease Mother   . Hypertension Mother   . Cancer Mother        Cervical   . Asthma Mother   . Heart attack Father   . Cancer Father        Prostate  . Colon cancer Father        DECEASED AGE 31  . Colon polyps Neg Hx     Immunization History  Administered Date(s) Administered  . Influenza Split 03/30/2011  . Influenza Whole 03/21/2005, 02/09/2006  . Influenza,inj,Quad PF,6+ Mos 02/11/2014, 07/27/2015, 03/09/2016, 01/29/2017, 02/07/2018, 01/30/2019  . Pneumococcal Polysaccharide-23 04/17/2006, 09/14/2015  . Td 04/17/2006    Review of systems: Positive Findings in bold print.  Constitutional:  chills, fatigue, fever, sweats, weight change Communication: Optometrist, sign Ecologist, hand writing, iPad/Android device Head: headaches, head injury Eyes: changes in vision, eye pain, glaucoma, cataracts, macular degeneration, diplopia, glare,  light sensitivity, eyeglasses or contacts, blindness Ears nose mouth throat: hearing impaired, hearing aids,  ringing in ears, deaf, sign language,  vertigo, nosebleeds,  rhinitis,  cold sores, snoring, swollen glands Cardiovascular: HTN, edema, arrhythmia, pacemaker in place, defibrillator in place, chest pain/tightness, chronic anticoagulation, blood clot, heart failure, MI Peripheral Vascular:  leg cramps, varicose veins, blood clots, lymphedema, varicosities Respiratory:  difficulty breathing, denies congestion, SOB, wheezing, cough, emphysema, asthma Gastrointestinal: change in appetite or weight, abdominal pain, constipation, diarrhea, nausea, vomiting, vomiting blood, change in bowel habits, abdominal pain, jaundice, rectal bleeding, hemorrhoids, GERD Genitourinary:  nocturia,  pain on urination, polyuria,  blood in urine, Foley catheter, urinary urgency, ESRD on hemodialysis Musculoskeletal: amputation, cramping, stiff joints, painful joints, decreased joint motion, fractures, OA, gout, hemiplegia, paraplegia, uses cane, wheelchair bound, uses walker, uses rollator Skin: +changes in toenails, color change, dryness, itching, mole changes,  rash, wound(s) Neurological: headaches, numbness in feet, paresthesias in feet, burning in feet, fainting,  seizures, change in speech,  headaches, memory problems/poor historian, cerebral palsy, weakness, paralysis, CVA, TIA Endocrine: diabetes, hypothyroidism, hyperthyroidism,  goiter, dry mouth, flushing, heat intolerance,  cold intolerance,  excessive thirst, denies polyuria,  nocturia Hematological:  easy bleeding, excessive bleeding, easy bruising, enlarged lymph nodes, on long term blood thinner, history of past transusions Allergy/immunological:  hives, eczema, frequent infections, multiple drug allergies, seasonal allergies, transplant recipient, multiple food allergies Psychiatric:  anxiety, depression, mood disorder, suicidal ideations, hallucinations, insomnia  Objective: 58 y.o. AAF, obese in NAD. AAO x 3.   Vascular Examination: Capillary refill time immediate b/l.  Dorsalis pedis and Posterior tibial pulses palpable b/l.  Digital hair sparse b/l.   Skin temperature gradient WNL b/l.  Dermatological Examination: Skin with normal turgor, texture and tone b/l.  Toenails 1-5 b/l discolored, thick, dystrophic with subungual debris  and pain with palpation to nailbeds due to thickness of nails.  Porokeratotic lesions submet head 5 left foot with tenderness to palpation. No erythema, no edema, no drainage, no flocculence. Hyperkeratotic lesion submet head 5 right  footwith tenderness to palpation. No edema, no erythema, no drainage, no flocculence.  Musculoskeletal: Muscle strength 5/5 to all LE muscle groups b/l.   HAV with bunion deformity b/l.   Neurological: Sensation diminished b/l with 10 gram monofilament.  Vibratory sensation diminished b/l.  Assessment: 1. Painful onychomycosis toenails 1-5 b/l  2. Porokeratotic lesion submet head 5 left foot 3. Callus submet head 5 right foot 4. NIDDM with neuropathy  Plan: 1. Discussed diabetic foot care principles. Literature dispensed on today. 2. Toenails 1-5 b/l were debrided in length and girth without iatrogenic bleeding.  3. Porokeratosis submet head 5 left foot pared and enucleated with sterile scalpel blade without incident. Callus pared submetatarsal head 5 left foot utilizing sterile scalpel blade without incident. 4. Patient to continue soft, supportive shoe gear. 5. Patient to report any pedal injuries to medical professional immediately. 6. Follow up 3 months.  7. Patient/POA to call should there be a concern in the interim.

## 2019-03-31 ENCOUNTER — Telehealth: Payer: Self-pay | Admitting: *Deleted

## 2019-03-31 ENCOUNTER — Other Ambulatory Visit: Payer: Self-pay

## 2019-03-31 ENCOUNTER — Telehealth: Payer: Self-pay | Admitting: Internal Medicine

## 2019-03-31 DIAGNOSIS — R05 Cough: Secondary | ICD-10-CM

## 2019-03-31 DIAGNOSIS — R053 Chronic cough: Secondary | ICD-10-CM

## 2019-03-31 NOTE — Telephone Encounter (Signed)
Pt called she has mammogram on Monday and wanted to know if Dr Moshe Cipro would add a chest xray because she has had a cough since January that will not go away. She would like a call back.

## 2019-03-31 NOTE — Telephone Encounter (Signed)
Patient wanted to know if Dr. Margaretann Loveless would approve the patient to have a chest x-ray next Monday. She is scheduled to have a mammogram next Monday 12/07 at [facility].  She has had a persistent cough since January, and would like to know what is causing the cough. She spoke with Dr. Moshe Cipro her PCP already, and Dr. Griffin Dakin office wants to make sure Dr. Margaretann Loveless is OK with the patient having the test. The patient has an appt with Dr. Moshe Cipro on 12/09   Patient says it is OK to leave a detailed message on her voicemail if the office is unable to reach her

## 2019-03-31 NOTE — Telephone Encounter (Signed)
CXR ordered per MD

## 2019-03-31 NOTE — Telephone Encounter (Signed)
Please advise 

## 2019-03-31 NOTE — Telephone Encounter (Signed)
No cardiovascular contraindication to obtaining a chest xray. This should be ordered and coordinated by Dr. Griffin Dakin office as it is for a noncardiac indication. Thanks, GA

## 2019-03-31 NOTE — Telephone Encounter (Signed)
pls order CXR , I will sign, dx chronic cough, thanks

## 2019-03-31 NOTE — Telephone Encounter (Signed)
Called patient and advised her that cxr has been ordered with verbal understanding. She thanked me for the return call.

## 2019-03-31 NOTE — Telephone Encounter (Signed)
Will route to MD to advise if okay. Thank you!

## 2019-04-02 ENCOUNTER — Ambulatory Visit (INDEPENDENT_AMBULATORY_CARE_PROVIDER_SITE_OTHER): Payer: Medicare Other | Admitting: "Endocrinology

## 2019-04-02 ENCOUNTER — Other Ambulatory Visit: Payer: Self-pay

## 2019-04-02 ENCOUNTER — Encounter: Payer: Self-pay | Admitting: "Endocrinology

## 2019-04-02 VITALS — BP 138/71 | HR 87 | Ht 64.0 in | Wt 323.0 lb

## 2019-04-02 DIAGNOSIS — E782 Mixed hyperlipidemia: Secondary | ICD-10-CM

## 2019-04-02 DIAGNOSIS — E039 Hypothyroidism, unspecified: Secondary | ICD-10-CM

## 2019-04-02 DIAGNOSIS — E1165 Type 2 diabetes mellitus with hyperglycemia: Secondary | ICD-10-CM | POA: Diagnosis not present

## 2019-04-02 DIAGNOSIS — I10 Essential (primary) hypertension: Secondary | ICD-10-CM | POA: Diagnosis not present

## 2019-04-02 LAB — POCT GLYCOSYLATED HEMOGLOBIN (HGB A1C): Hemoglobin A1C: 13 % — AB (ref 4.0–5.6)

## 2019-04-02 MED ORDER — LANTUS SOLOSTAR 100 UNIT/ML ~~LOC~~ SOPN: 60.0000 [IU] | PEN_INJECTOR | Freq: Every day | SUBCUTANEOUS | 2 refills | Status: DC

## 2019-04-02 NOTE — Telephone Encounter (Signed)
Spoke to patient . She states Dr Moshe Cipro has already schedule xray for  Monday 04/07/19.  patient states she has been having a cough since Jan 2020.  Nothing is needed at present time.  Patient thanked nurse for call back .

## 2019-04-02 NOTE — Progress Notes (Signed)
04/02/2019, 2:50 PM                    Endocrinology follow-up note   Subjective:    [name], female    DOB: 03/16/1961.  [place] is being  Seen in f/u after she was seen in consultation for management of currently uncontrolled symptomatic diabetes requested by  Fayrene Helper, MD.   Past Medical History:  Diagnosis Date  . Anxiety   . Arthritis   . Asthma   . Coronary atherosclerosis of native coronary artery    Mild nonobstructive CAD by cardiac catheterization 2008 - Dr. Terrence Dupont  . Degenerative disc disease   . Depression   . Dysrhythmia   . Essential hypertension, benign   . Folliculitis 6/44/0347  . GERD (gastroesophageal reflux disease)   . H/O hiatal hernia   . Helicobacter pylori gastritis 2008  . Hypercholesterolemia   . Hypertension   . Hyperthyroidism    s/p radiation  . Left-sided epistaxis 05/15/2018  . Low back pain   . Migraine   . Nephrolithiasis    Recurring episodes since 2004  . Severe obesity (BMI >= 40) (HCC)   . Sleep apnea    STOP BANG SCORE 6no cpap used  . Type 2 diabetes mellitus with diabetic neuropathy [facility])     Past Surgical History:  Procedure Laterality Date  . ABDOMINAL HYSTERECTOMY    . BACK SURGERY  1993  . BACK SURGERY  1994  . BACK SURGERY  2002  . BACK SURGERY  2011   [place] Left   . CHOLECYSTECTOMY  1996  . COLONOSCOPY  2000 [facility]   INT HEMORRHOIDS/FISSURE  . COLONOSCOPY  2003 [facility], CHANGE IN BOWEL HABITS   INT HEMORRHOIDS  . COLONOSCOPY  2006 [facility]   INT HEMORRHOIDS  . COLONOSCOPY  2007 [facility]   INT HEMORRHOIDS  . CYSTOSCOPY WITH STENT PLACEMENT Left 07/25/2012   Procedure: CYSTOSCOPY, left retograde pyelogram WITH left ureteral  STENT PLACEMENT;  Surgeon: Claybon Jabs, MD;  Location: [facility];  Service: Urology;  Laterality: Left;  . CYSTOSCOPY/RETROGRADE/URETEROSCOPY  12/04/2011   Procedure: CYSTOSCOPY/RETROGRADE/URETEROSCOPY;   Surgeon: Molli Hazard, MD;  Location: [facility];  Service: Urology;  Laterality: Right;  . CYSTOSCOPY/RETROGRADE/URETEROSCOPY/STONE EXTRACTION WITH BASKET Left 09/03/2012   Procedure: CYSTOSCOPY/RETROGRADE pyelogram/digital URETEROSCOPY/STONE EXTRACTION WITH BASKET, left stent removal;  Surgeon: Molli Hazard, MD;  Location: [facility];  Service: Urology;  Laterality: Left;  . ESOPHAGOGASTRODUODENOSCOPY  2008   QQV:ZDGLO hiatal hernia./Normal esophagus without evidence of Barrett's mass, stricture, erosion or ulceration./Normal duodenal bulb and second portion of the duodenum./Diffuse erythema in the body and the antrum with occasional erosion.  Biopsies obtained via cold forceps to evaluate for H. pylori gastritis  . FRACTURE SURGERY  1999   right clavicle  . HOLMIUM LASER APPLICATION Left 11/03/6431   Procedure: HOLMIUM LASER APPLICATION;  Surgeon: Molli Hazard, MD;  Location: [facility];  Service: Urology;  Laterality: Left;  . KNEE ARTHROSCOPY  10/04/2010,    right knee arthroscopy, dr Theda Sers  . LEFT HEART CATH AND CORONARY ANGIOGRAPHY N/A 05/08/2018   Procedure: LEFT HEART CATH AND CORONARY ANGIOGRAPHY;  Surgeon: Troy Sine, MD;  Location: [facility];  Service: Cardiovascular;  Laterality: N/A;  . left knee arthroscopic surgery  1999  . Left salphingectomy secondary to ectopic pregnancy  1991  . PARTIAL HYSTERECTOMY  1991  .  rotary cuff  Right 05/14/2014   [facility]  . UPPER GASTROINTESTINAL ENDOSCOPY  2008 abd pain   H. pylori gastritis  . UPPER GASTROINTESTINAL ENDOSCOPY  1996 [facility] [place]   PUD    Social History   Socioeconomic History  . Marital status: Single    Spouse name: Not on file  . Number of children: 1  . Years of education: 51  . Highest education level: 12th grade  Occupational History  . Occupation: Disable   Social Needs  . Financial resource strain: Not hard at all  . Food insecurity    Worry: Never true    Inability: Never true  .  Transportation needs    Medical: No    Non-medical: No  Tobacco Use  . Smoking status: Never Smoker  . Smokeless tobacco: Never Used  Substance and Sexual Activity  . Alcohol use: No  . Drug use: No  . Sexual activity: Not Currently    Partners: Male    Birth control/protection: Surgical    Comment: hyst  Lifestyle  . Physical activity    Days per week: 7 days    Minutes per session: 20 min  . Stress: Only a little  Relationships  . Social connections    Talks on phone: More than three times a week    Gets together: Once a week    Attends religious service: 1 to 4 times per year    Active member of club or organization: Yes    Attends meetings of clubs or organizations: Never    Relationship status: Never married  Other Topics Concern  . Not on file  Social History Narrative   Lives alone     Family History  Problem Relation Age of Onset  . Heart disease Mother   . Hypertension Mother   . Cancer Mother        Cervical   . Asthma Mother   . Heart attack Father   . Cancer Father        Prostate  . Colon cancer Father        DECEASED AGE 32  . Colon polyps Neg Hx     Outpatient Encounter Medications as of 04/02/2019  Medication Sig  . albuterol (PROAIR HFA) 108 (90 Base) MCG/ACT inhaler INHALE 2 PUFFS BY MOUTH EVERY 6-8HRS AS NEEDED (Patient taking differently: Inhale 2 puffs into the lungs every 6 (six) hours as needed for wheezing or shortness of breath. )  . amLODipine (NORVASC) 10 MG tablet Take 1 tablet (10 mg total) by mouth daily.  . blood glucose meter kit and supplies Dispense based on patient and insurance preference. Once daily testing dx. e11.9  . calcipotriene (DOVONOX) 0.005 % ointment APPLY TO AFFECTED AREA OF LEGS AND ELBOWS DAILY  . carvedilol (COREG) 12.5 MG tablet TAKE 1 TABLET (12.5 MG TOTAL) BY MOUTH 2 (TWO) TIMES DAILY.  . cloNIDine (CATAPRES - DOSED IN MG/24 HR) 0.3 mg/24hr patch APPLY 1 PATCH TO SKIN ONCE A WEEK  . cloNIDine (CATAPRES) 0.3 MG  tablet TAKE 1 TABLET (0.3 MG TOTAL) BY MOUTH AT BEDTIME.  . cyclobenzaprine (FLEXERIL) 10 MG tablet TAKE 1 TABLET BY MOUTH THREE TIMES A DAY  . diazepam (VALIUM) 5 MG tablet Take one tablet by mouth three times daily for back spasm  . DULoxetine (CYMBALTA) 60 MG capsule TAKE 1 CAPSULE BY MOUTH TWICE A DAY (Patient taking differently: Take 60 mg by mouth 2 (two) times daily. )  . ergocalciferol (VITAMIN D2) 50000  units capsule Take 1 capsule (50,000 Units total) by mouth once a week. One capsule once weekly  . esomeprazole (NEXIUM) 40 MG capsule Take 1 capsule (40 mg total) by mouth daily.  . fluconazole (DIFLUCAN) 150 MG tablet TAKE 1 TABLET BY MOUTH AS ONE DOSE  . fluticasone (FLONASE) 50 MCG/ACT nasal spray Place 2 sprays into both nostrils daily. (Patient taking differently: Place 2 sprays into both nostrils daily as needed for allergies. )  . gabapentin (NEURONTIN) 800 MG tablet TAKE 1 TABLET BY MOUTH FOUR TIMES A DAY  . glipiZIDE (GLUCOTROL) 10 MG tablet Take 10 mg by mouth daily before breakfast.  . glucose blood test strip Use as instructed four times daily testing dx e11.65  . hydrALAZINE (APRESOLINE) 25 MG tablet Take by mouth.  . hydrOXYzine (ATARAX/VISTARIL) 10 MG tablet Take 10-30 mg by mouth at bedtime.   . Insulin Glargine (LANTUS SOLOSTAR) 100 UNIT/ML Solostar Pen Inject 60 Units into the skin at bedtime.  . Insulin Pen Needle (B-D ULTRAFINE III SHORT PEN) 31G X 8 MM MISC 1 each by Does not apply route as directed.  Marland Kitchen ipratropium-albuterol (DUONEB) 0.5-2.5 (3) MG/3ML SOLN Take 3 mLs by nebulization every 6 (six) hours as needed. (Patient taking differently: Take 3 mLs by nebulization every 6 (six) hours as needed (SOB). )  . KLOR-CON M20 20 MEQ tablet TAKE 1 TABLET BY MOUTH TWICE A DAY  . Lancets (ONETOUCH DELICA PLUS [id number]) MISC TEST ONCE DAILY  . levothyroxine (SYNTHROID) 50 MCG tablet TAKE 1 TABLET BY MOUTH DAILY BEFORE BREAKFAST  . loratadine (CLARITIN) 10 MG tablet Take 1  tablet (10 mg total) by mouth daily.  . meclizine (ANTIVERT) 25 MG tablet Take 25 mg by mouth 3 (three) times daily as needed for dizziness.  . metFORMIN (GLUCOPHAGE) 1000 MG tablet Take 1 tablet (1,000 mg total) by mouth 2 (two) times daily.  . metoprolol tartrate (LOPRESSOR) 25 MG tablet Take 1 tablet (25 mg total) by mouth 2 (two) times daily.  . montelukast (SINGULAIR) 10 MG tablet TAKE 1 TABLET (10 MG TOTAL) BY MOUTH AT BEDTIME.  . nitroGLYCERIN (NITROSTAT) 0.4 MG SL tablet Place 1 tablet (0.4 mg total) under the tongue every 5 (five) minutes as needed for chest pain.  Marland Kitchen ondansetron (ZOFRAN) 4 MG tablet Take 1 tablet (4 mg total) by mouth every 8 (eight) hours as needed for nausea or vomiting.  Marland Kitchen oxyCODONE (OXYCONTIN) 40 mg 12 hr tablet Take 1 tablet (40 mg total) by mouth every 12 (twelve) hours.  Derrill Memo ON 04/14/2019] oxyCODONE (OXYCONTIN) 40 mg 12 hr tablet Take one tablet by mouth two times daily for back pain  . rosuvastatin (CRESTOR) 40 MG tablet TAKE 1 TABLET BY MOUTH EVERY DAY  . SYMBICORT 160-4.5 MCG/ACT inhaler TAKE 2 PUFFS BY MOUTH TWICE A DAY  . triamterene-hydrochlorothiazide (MAXZIDE) 75-50 MG tablet Take 1 tablet by mouth daily.  Marland Kitchen zolpidem (AMBIEN) 10 MG tablet Take 1 tablet (10 mg total) by mouth at bedtime.  Marland Kitchen zolpidem (AMBIEN) 10 MG tablet Take 1 tablet (10 mg total) by mouth at bedtime.  . [DISCONTINUED] Insulin Glargine (LANTUS SOLOSTAR) 100 UNIT/ML Solostar Pen Inject 40 Units into the skin at bedtime.   No facility-administered encounter medications on file as of 04/02/2019.     ALLERGIES: Allergies  Allergen Reactions  . Morphine Other (See Comments)    Reaction with esophagus, unable to swallow.   . Ace Inhibitors Cough  . Aspirin Other (See Comments)    Reaction  with esophagus, unable to swallow.   . Losartan Cough  . Tapentadol Other (See Comments)    Nausea, increased sleepiness, h/a  . Tomato Other (See Comments)    Acid reflux due to acid in tomato   . Adhesive [Tape] Rash    VACCINATION STATUS: Immunization History  Administered Date(s) Administered  . Influenza Split 03/30/2011  . Influenza Whole 03/21/2005, 02/09/2006  . Influenza,inj,Quad PF,6+ Mos 02/11/2014, 07/27/2015, 03/09/2016, 01/29/2017, 02/07/2018, 01/30/2019  . Pneumococcal Polysaccharide-23 04/17/2006, 09/14/2015  . Td 04/17/2006    Diabetes She presents for her follow-up diabetic visit. She has type 2 diabetes mellitus. Her disease course has been worsening. There are no hypoglycemic associated symptoms. Pertinent negatives for hypoglycemia include no confusion, headaches, pallor or seizures. Associated symptoms include fatigue. Pertinent negatives for diabetes include no blurred vision, no chest pain, no polydipsia, no polyphagia and no polyuria. There are no hypoglycemic complications. Symptoms are worsening. Diabetic complications include heart disease. Risk factors for coronary artery disease include dyslipidemia, diabetes mellitus, family history, hypertension, obesity, sedentary lifestyle and tobacco exposure. Current diabetic treatment includes oral agent (dual therapy). She is following a generally unhealthy diet. When asked about meal planning, she reported none. She has had a previous visit with a dietitian. She never participates in exercise. Her home blood glucose trend is increasing steadily. Her breakfast blood glucose range is generally >200 mg/dl. Her bedtime blood glucose range is generally >200 mg/dl. Her overall blood glucose range is >200 mg/dl. An ACE inhibitor/angiotensin II receptor blocker is being taken. She does not see a podiatrist.Eye exam is not current.  Hyperlipidemia This is a chronic problem. The current episode started more than 1 year ago. The problem is controlled. Exacerbating diseases include diabetes and obesity. Associated symptoms include shortness of breath. Pertinent negatives include no chest pain or myalgias. Risk factors for coronary  artery disease include diabetes mellitus, dyslipidemia, hypertension, obesity and a sedentary lifestyle.  Hypertension This is a chronic problem. The current episode started more than 1 year ago. The problem is uncontrolled. Associated symptoms include shortness of breath. Pertinent negatives include no blurred vision, chest pain, headaches or palpitations. Risk factors for coronary artery disease include family history, dyslipidemia, diabetes mellitus, obesity, sedentary lifestyle and post-menopausal state. Past treatments include angiotensin blockers. Hypertensive end-organ damage includes CAD/MI.     Review of Systems  Constitutional: Positive for fatigue. Negative for chills, fever and unexpected weight change.  HENT: Negative for trouble swallowing and voice change.   Eyes: Negative for blurred vision and visual disturbance.  Respiratory: Positive for cough and shortness of breath. Negative for wheezing.   Cardiovascular: Negative for chest pain, palpitations and leg swelling.  Gastrointestinal: Negative for diarrhea, nausea and vomiting.  Endocrine: Negative for cold intolerance, heat intolerance, polydipsia, polyphagia and polyuria.  Musculoskeletal: Negative for arthralgias and myalgias.  Skin: Negative for color change, pallor, rash and wound.  Neurological: Negative for seizures and headaches.  Psychiatric/Behavioral: Negative for confusion and suicidal ideas.    Objective:    BP 138/71   Pulse 87   Ht _0  (1.626 m)   Wt (!) 323 lb (146.5 kg)   BMI 55.44 kg/m   Wt Readings from Last 3 Encounters:  04/02/19 (!) 323 lb (146.5 kg)  01/30/19 (!) 324 lb (147 kg)  01/23/19 (!) 325 lb (147.4 kg)     CMP ( most recent) CMP     Component Value Date/Time   NA 139 12/19/2018 1321   K 4.1 12/19/2018 1321  CL 101 12/19/2018 1321   CO2 29 12/19/2018 1321   GLUCOSE 228 (H) 12/19/2018 1321   BUN 14 12/19/2018 1321   CREATININE 0.96 12/19/2018 1321   CALCIUM 9.8 12/19/2018  1321   PROT 7.3 12/19/2018 1321   ALBUMIN 3.9 05/05/2018 2250   AST 24 12/19/2018 1321   ALT 28 12/19/2018 1321   ALKPHOS 63 05/05/2018 2250   BILITOT 0.4 12/19/2018 1321   GFRNONAA 65 12/19/2018 1321   GFRAA 76 12/19/2018 1321    Diabetic Labs (most recent): Lab Results  Component Value Date   HGBA1C 11.9 (H) 12/19/2018   HGBA1C >14.0 (H) 08/28/2018   HGBA1C 7.4 (H) 02/06/2018     Lipid Panel ( most recent) Lipid Panel     Component Value Date/Time   CHOL 90 08/28/2018 0903   TRIG 179 (H) 08/28/2018 0903   HDL 31 (L) 08/28/2018 0903   CHOLHDL 2.9 08/28/2018 0903   VLDL 16 05/06/2018 0335   LDLCALC 33 08/28/2018 0903      Assessment & Plan:   1. Uncontrolled type 2 diabetes mellitus with hyperglycemia ([place])   - [place] has currently uncontrolled symptomatic type 2 DM since  57 years of age.   -She returns with significantly above target glycemic profile both fasting and at bedtime and her point-of-care A1c is 13% increasing from 11.9% during her last visit.  She did have A1c of greater than 14% last year.     Recent labs reviewed.  -her diabetes is complicated by coronary artery disease, obesity/sedentary life and she remains at a high risk for more acute and chronic complications which include CAD, CVA, CKD, retinopathy, and neuropathy. These are all discussed in detail with her.  - I have counseled her on diet management and weight loss, by adopting a carbohydrate restricted/protein rich diet.  - she  admits there is a room for improvement in her diet and drink choices. -  Suggestion is made for her to avoid simple carbohydrates  from her diet including Cakes, Sweet Desserts / Pastries, Ice Cream, Soda (diet and regular), Sweet Tea, Candies, Chips, Cookies, Sweet Pastries,  Store Bought Juices, Alcohol in Excess of  1-2 drinks a day, Artificial Sweeteners, Coffee Creamer, and "Sugar-free" Products. This will help patient to have stable blood glucose profile  and potentially avoid unintended weight gain.   - I encouraged her to switch to  unprocessed or minimally processed complex starch and increased protein intake (animal or plant source), fruits, and vegetables.  - she is advised to stick to a routine mealtimes to eat 3 meals  a day and avoid unnecessary snacks ( to snack only to correct hypoglycemia).   - she has been scheduled with Jearld Fenton, RDN, CDE for individualized diabetes education.  - I have approached her with the following individualized plan to manage diabetes and patient agrees:   -She is advised to increase her Lantus to 60 units nightly, early to initiate strict monitoring of blood glucose 4 times a day-daily before meals and at bedtime and will be on telephone visit in 2 weeks for reevaluation.    -She will likely need prandial insulin in order for her to achieve and maintain control of diabetes to target.  - she is warned not to take insulin without proper monitoring per orders.  - she is encouraged to call clinic for blood glucose levels less than 70 or above 300 mg /dl. - she is advised to continue metformin 1000 mg p.o. twice daily after  breakfast and after supper, and glipizide  10 mg XL daily at breakfast only.  - she will be considered for incretin therapy as appropriate next visit.   2) Blood Pressure /Hypertension: she is advised to home monitor blood pressure and report if > 140/90 on 2 separate readings.   she is advised to continue her current medications including amlodipine 10 mg p.o. daily, carvedilol 12.5 mg p.o. twice daily, clonidine 0.3 mg/24-hour patch, clonidine 0.3 mg daily at bedtime, hydralazine 25 mg p.o. twice daily.  She will need work-up to rule out primary hypertension on subsequent visits.  3) Lipids/Hyperlipidemia:   Review of her recent lipid panel showed  controlled  LDL at 33.  she  is advised to continue Crestor 40 mg p.o. daily at bedtime.   Side effects and precautions discussed  with her.  4)  Weight/Diet: She is obese, clearly complicating her diabetes care.  I discussed with her the fact that loss of 5 - 10% of her  current body weight will have the most impact on her diabetes management.  CDE Consult will be initiated . Exercise, and detailed carbohydrates information provided  -  detailed on discharge instructions.  5) hypothyroidism:   -She was recently initiated on levothyroxine 50 mcg daily before breakfast.  - We discussed about the correct intake of her thyroid hormone, on empty stomach at fasting, with water, separated by at least 30 minutes from breakfast and other medications,  and separated by more than 4 hours from calcium, iron, multivitamins, acid reflux medications (PPIs). -Patient is made aware of the fact that thyroid hormone replacement is needed for life, dose to be adjusted by periodic monitoring of thyroid function tests.   6) Chronic Care/Health Maintenance:  -she  is on Statin medications and  is encouraged to initiate and continue to follow up with Ophthalmology, Dentist,  Podiatrist at least yearly or according to recommendations, and advised to  stay away from smoking. I have recommended yearly flu vaccine and pneumonia vaccine at least every 5 years; moderate intensity exercise for up to 150 minutes weekly; and  sleep for at least 7 hours a day.  - she is  advised to maintain close follow up with Fayrene Helper, MD for primary care needs, as well as her other providers for optimal and coordinated care.   - Time spent with the patient: 25 min, of which >50% was spent in reviewing her blood glucose logs , discussing her hypoglycemia and hyperglycemia episodes, reviewing her current and  previous labs / studies and medications  doses and developing a plan to avoid hypoglycemia and hyperglycemia. Please refer to Patient Instructions for Blood Glucose Monitoring and Insulin/Medications Dosing Guide"  in media tab for additional information.  Please  also refer to " Patient Self Inventory" in the Media  tab for reviewed elements of pertinent patient history.  [place] participated in the discussions, expressed understanding, and voiced agreement with the above plans.  All questions were answered to her satisfaction. she is encouraged to contact clinic should she have any questions or concerns prior to her return visit.    Follow up plan: - Return in about 2 weeks (around 04/16/2019) for Follow up with Meter and Logs Only - no Labs.  Glade Lloyd, MD [facility] [address] [place], [place] [address] Phone: [phone]  Fax: [phone]    04/02/2019, 2:50 PM  This note was partially dictated with voice recognition software. Similar sounding words  can be transcribed inadequately or may not  be corrected upon review.

## 2019-04-02 NOTE — Patient Instructions (Signed)

## 2019-04-07 ENCOUNTER — Other Ambulatory Visit: Payer: Self-pay

## 2019-04-07 ENCOUNTER — Ambulatory Visit (HOSPITAL_COMMUNITY)
Admission: RE | Admit: 2019-04-07 | Discharge: 2019-04-07 | Disposition: A | Discharge status: Home / Self Care | Payer: Medicare Other | Source: Ambulatory Visit | Attending: Family Medicine | Admitting: Family Medicine

## 2019-04-07 DIAGNOSIS — R05 Cough: Secondary | ICD-10-CM

## 2019-04-07 DIAGNOSIS — Z1231 Encounter for screening mammogram for malignant neoplasm of breast: Secondary | ICD-10-CM | POA: Diagnosis not present

## 2019-04-07 DIAGNOSIS — R053 Chronic cough: Secondary | ICD-10-CM

## 2019-04-08 ENCOUNTER — Ambulatory Visit (INDEPENDENT_AMBULATORY_CARE_PROVIDER_SITE_OTHER): Payer: Medicare Other | Admitting: Family Medicine

## 2019-04-08 ENCOUNTER — Encounter: Payer: Self-pay | Admitting: Family Medicine

## 2019-04-08 VITALS — BP 122/82 | HR 84 | Temp 98.5°F | Ht 64.0 in | Wt 323.0 lb

## 2019-04-08 DIAGNOSIS — I5032 Chronic diastolic (congestive) heart failure: Secondary | ICD-10-CM | POA: Diagnosis not present

## 2019-04-08 DIAGNOSIS — J42 Unspecified chronic bronchitis: Secondary | ICD-10-CM | POA: Diagnosis not present

## 2019-04-08 DIAGNOSIS — R062 Wheezing: Secondary | ICD-10-CM | POA: Diagnosis not present

## 2019-04-08 DIAGNOSIS — E1159 Type 2 diabetes mellitus with other circulatory complications: Secondary | ICD-10-CM | POA: Diagnosis not present

## 2019-04-08 DIAGNOSIS — E039 Hypothyroidism, unspecified: Secondary | ICD-10-CM

## 2019-04-08 DIAGNOSIS — I10 Essential (primary) hypertension: Secondary | ICD-10-CM

## 2019-04-08 DIAGNOSIS — R52 Pain, unspecified: Secondary | ICD-10-CM | POA: Diagnosis not present

## 2019-04-08 DIAGNOSIS — F5105 Insomnia due to other mental disorder: Secondary | ICD-10-CM

## 2019-04-08 DIAGNOSIS — F409 Phobic anxiety disorder, unspecified: Secondary | ICD-10-CM

## 2019-04-08 MED ORDER — PREDNISONE 5 MG (21) PO TBPK: 5.0000 mg | ORAL_TABLET | ORAL | 0 refills | Status: DC

## 2019-04-08 MED ORDER — IPRATROPIUM-ALBUTEROL 0.5-2.5 (3) MG/3ML IN SOLN: 3.0000 mL | Freq: Four times a day (QID) | RESPIRATORY_TRACT | 3 refills | Status: DC | PRN

## 2019-04-08 MED ORDER — FLUCONAZOLE 150 MG PO TABS: ORAL_TABLET | ORAL | 0 refills | Status: DC

## 2019-04-08 MED ORDER — METHYLPREDNISOLONE ACETATE 80 MG/ML IJ SUSP
80.0000 mg | Freq: Once | INTRAMUSCULAR | Status: AC
  Administered 2019-04-08: 80 mg via INTRAMUSCULAR

## 2019-04-08 NOTE — Patient Instructions (Addendum)
Follow-up in office with MD in 11 weeks call if you need me sooner.  You being referred to pulmonary specialist to evaluate chronic  bronchitis with wheezing and assist with management.  You are being referred to cardiology for evaluation of diastolic heart failure with evidence of decompensation.  Your chest x-ray done yesterday does did not show any significant infection or abnormality in the lung.  Depo-Medrol 80 mg IM administered in the office to be followed by prednisone prednisone 10 mg Dosepak to pain management will continue as before.  If you develop increased shortness of breath wheezing or lightheadedness please go to the nearest emergency room for evaluation and management.  Thanks for choosing [facility], we consider it a privelige to serve you.

## 2019-04-08 NOTE — Assessment & Plan Note (Signed)
6 month h/o increased wheezing and SOB, sputum is clear, no fever or chills Depo medrol 80 mg iM in office , followed by prednisone dpose pack, and needs pulmonary eval asap

## 2019-04-08 NOTE — Assessment & Plan Note (Signed)
Increased dyspnea with activity , 6 pillow orthopnea in past 4 months, refer to card

## 2019-04-14 ENCOUNTER — Ambulatory Visit: Payer: Self-pay

## 2019-04-15 ENCOUNTER — Ambulatory Visit (INDEPENDENT_AMBULATORY_CARE_PROVIDER_SITE_OTHER): Payer: Medicare Other | Admitting: Family Medicine

## 2019-04-15 ENCOUNTER — Encounter: Payer: Self-pay | Admitting: Family Medicine

## 2019-04-15 ENCOUNTER — Other Ambulatory Visit: Payer: Self-pay

## 2019-04-15 VITALS — BP 122/82 | HR 84 | Resp 15 | Ht 64.0 in | Wt 323.0 lb

## 2019-04-15 DIAGNOSIS — Z0000 Encounter for general adult medical examination without abnormal findings: Secondary | ICD-10-CM | POA: Diagnosis not present

## 2019-04-15 NOTE — Progress Notes (Signed)
Subjective:   Angela [name] is a 58 y.o. female who presents for Medicare Annual (Subsequent) preventive examination.  Location of Patient: Home Location of Provider: Telehealth Consent was obtain for visit to be over via telehealth.  I verified that I am speaking with the correct person using two identifiers.   Review of Systems:    Cardiac Risk Factors include: diabetes mellitus;dyslipidemia;hypertension;obesity (BMI >30kg/m2)     Objective:     Vitals: BP 122/82   Pulse 84   Resp 15   Ht _0  (1.626 m)   Wt (!) 323 lb (146.5 kg)   BMI 55.44 kg/m   Body mass index is 55.44 kg/m.  Advanced Directives 05/06/2018 05/05/2018 04/10/2018 07/24/2017 08/28/2016 05/03/2016 02/26/2015  Does Patient Have a Medical Advance Directive? No Yes No No Yes Yes No  Type of Advance Directive - [place];Living will -  Does patient want to make changes to medical advance directive? - - - - - No - Patient declined -  Copy of [place] in Chart? - No - copy requested - - - No - copy requested -  Would patient like information on creating a medical advance directive? No - Patient declined No - Patient declined No - Patient declined No - Patient declined - - No - patient declined information  Pre-existing out of facility DNR order (yellow form or pink MOST form) - - - - - - -    Tobacco Social History   Tobacco Use  Smoking Status Never Smoker  Smokeless Tobacco Never Used     Counseling given: Yes   Clinical Intake:  Pre-visit preparation completed: Yes  Pain : 0-10 Pain Score: 8  Pain Type: Chronic pain Pain Location: Buttocks Pain Orientation: Left Pain Radiating Towards: left leg Pain Descriptors / Indicators: Sharp Pain Onset: 1 to 4 weeks ago Pain Frequency: Constant Pain Relieving Factors: sitting down Effect of Pain on Daily Activities: yes especially right now  Pain Relieving Factors: sitting  down  BMI - recorded: 55.44 Nutritional Status: BMI > 30  Obese Nutritional Risks: None Diabetes: Yes CBG done?: No Did pt. bring in CBG monitor from home?: No  How often do you need to have someone help you when you read instructions, pamphlets, or other written materials from your doctor or pharmacy?: 1 - Never What is the last grade level you completed in school?: 12  Interpreter Needed?: No     Past Medical History:  Diagnosis Date  . Anxiety   . Arthritis   . Asthma   . Coronary atherosclerosis of native coronary artery    Mild nonobstructive CAD by cardiac catheterization 2008 - Dr. Terrence Dupont  . Degenerative disc disease   . Depression   . Dysrhythmia   . Essential hypertension, benign   . Folliculitis 8/41/6606  . GERD (gastroesophageal reflux disease)   . H/O hiatal hernia   . Helicobacter pylori gastritis 2008  . Hypercholesterolemia   . Hypertension   . Hyperthyroidism    s/p radiation  . Left-sided epistaxis 05/15/2018  . Low back pain   . Migraine   . Nephrolithiasis    Recurring episodes since 2004  . Severe obesity (BMI >= 40) (HCC)   . Sleep apnea    STOP BANG SCORE 6no cpap used  . Type 2 diabetes mellitus with diabetic neuropathy [facility])    Past Surgical History:  Procedure Laterality Date  . ABDOMINAL HYSTERECTOMY    .  BACK SURGERY  1993  . BACK SURGERY  1994  . BACK SURGERY  2002  . BACK SURGERY  2011   [place] Left   . CHOLECYSTECTOMY  1996  . COLONOSCOPY  2000 [facility]   INT HEMORRHOIDS/FISSURE  . COLONOSCOPY  2003 [facility], CHANGE IN BOWEL HABITS   INT HEMORRHOIDS  . COLONOSCOPY  2006 [facility]   INT HEMORRHOIDS  . COLONOSCOPY  2007 [facility]   INT HEMORRHOIDS  . CYSTOSCOPY WITH STENT PLACEMENT Left 07/25/2012   Procedure: CYSTOSCOPY, left retograde pyelogram WITH left ureteral  STENT PLACEMENT;  Surgeon: Claybon Jabs, MD;  Location: [facility];  Service: Urology;  Laterality: Left;  . CYSTOSCOPY/RETROGRADE/URETEROSCOPY   12/04/2011   Procedure: CYSTOSCOPY/RETROGRADE/URETEROSCOPY;  Surgeon: Molli Hazard, MD;  Location: [facility];  Service: Urology;  Laterality: Right;  . CYSTOSCOPY/RETROGRADE/URETEROSCOPY/STONE EXTRACTION WITH BASKET Left 09/03/2012   Procedure: CYSTOSCOPY/RETROGRADE pyelogram/digital URETEROSCOPY/STONE EXTRACTION WITH BASKET, left stent removal;  Surgeon: Molli Hazard, MD;  Location: [facility];  Service: Urology;  Laterality: Left;  . ESOPHAGOGASTRODUODENOSCOPY  2008   [id number]:WRUEA hiatal hernia./Normal esophagus without evidence of Barrett's mass, stricture, erosion or ulceration./Normal duodenal bulb and second portion of the duodenum./Diffuse erythema in the body and the antrum with occasional erosion.  Biopsies obtained via cold forceps to evaluate for H. pylori gastritis  . FRACTURE SURGERY  1999   right clavicle  . HOLMIUM LASER APPLICATION Left 09/02/979   Procedure: HOLMIUM LASER APPLICATION;  Surgeon: Molli Hazard, MD;  Location: [facility];  Service: Urology;  Laterality: Left;  . KNEE ARTHROSCOPY  10/04/2010,    right knee arthroscopy, dr Theda Sers  . LEFT HEART CATH AND CORONARY ANGIOGRAPHY N/A 05/08/2018   Procedure: LEFT HEART CATH AND CORONARY ANGIOGRAPHY;  Surgeon: Troy Sine, MD;  Location: [facility];  Service: Cardiovascular;  Laterality: N/A;  . left knee arthroscopic surgery  1999  . Left salphingectomy secondary to ectopic pregnancy  1991  . PARTIAL HYSTERECTOMY  1991  . rotary cuff  Right 05/14/2014   [facility]  . UPPER GASTROINTESTINAL ENDOSCOPY  2008 abd pain   H. pylori gastritis  . UPPER GASTROINTESTINAL ENDOSCOPY  [place] [place]   PUD   Family History  Problem Relation Age of Onset  . Heart disease Mother   . Hypertension Mother   . Cancer Mother        Cervical   . Asthma Mother   . Heart attack Father   . Cancer Father        Prostate  . Colon cancer Father        DECEASED AGE 96  . Colon polyps Neg Hx    Social History    Socioeconomic History  . Marital status: Single    Spouse name: Not on file  . Number of children: 1  . Years of education: 70  . Highest education level: 12th grade  Occupational History  . Occupation: Disable   Tobacco Use  . Smoking status: Never Smoker  . Smokeless tobacco: Never Used  Substance and Sexual Activity  . Alcohol use: No  . Drug use: No  . Sexual activity: Not Currently    Partners: Male    Birth control/protection: Surgical    Comment: hyst  Other Topics Concern  . Not on file  Social History Narrative   Lives alone    Social Determinants of Health   Financial Resource Strain:   . Difficulty of Paying Living Expenses:  Not on file  Food Insecurity:   . Worried About Charity fundraiser in the Last Year: Not on file  . Ran Out of Food in the Last Year: Not on file  Transportation Needs:   . Lack of Transportation (Medical): Not on file  . Lack of Transportation (Non-Medical): Not on file  Physical Activity:   . Days of Exercise per Week: Not on file  . Minutes of Exercise per Session: Not on file  Stress:   . Feeling of Stress : Not on file  Social Connections:   . Frequency of Communication with Friends and Family: Not on file  . Frequency of Social Gatherings with Friends and Family: Not on file  . Attends Religious Services: Not on file  . Active Member of Clubs or Organizations: Not on file  . Attends Archivist Meetings: Not on file  . Marital Status: Not on file    Outpatient Encounter Medications as of 04/15/2019  Medication Sig  . albuterol (PROAIR HFA) 108 (90 Base) MCG/ACT inhaler INHALE 2 PUFFS BY MOUTH EVERY 6-8HRS AS NEEDED (Patient taking differently: Inhale 2 puffs into the lungs every 6 (six) hours as needed for wheezing or shortness of breath. )  . amLODipine (NORVASC) 10 MG tablet Take 1 tablet (10 mg total) by mouth daily.  . blood glucose meter kit and supplies Dispense based on patient and insurance preference. Once  daily testing dx. e11.9  . calcipotriene (DOVONOX) 0.005 % ointment APPLY TO AFFECTED AREA OF LEGS AND ELBOWS DAILY  . carvedilol (COREG) 12.5 MG tablet TAKE 1 TABLET (12.5 MG TOTAL) BY MOUTH 2 (TWO) TIMES DAILY.  . cloNIDine (CATAPRES - DOSED IN MG/24 HR) 0.3 mg/24hr patch APPLY 1 PATCH TO SKIN ONCE A WEEK  . cloNIDine (CATAPRES) 0.3 MG tablet TAKE 1 TABLET (0.3 MG TOTAL) BY MOUTH AT BEDTIME.  . cyclobenzaprine (FLEXERIL) 10 MG tablet TAKE 1 TABLET BY MOUTH THREE TIMES A DAY  . diazepam (VALIUM) 5 MG tablet Take one tablet by mouth three times daily for back spasm  . DULoxetine (CYMBALTA) 60 MG capsule TAKE 1 CAPSULE BY MOUTH TWICE A DAY (Patient taking differently: Take 60 mg by mouth 2 (two) times daily. )  . ergocalciferol (VITAMIN D2) 50000 units capsule Take 1 capsule (50,000 Units total) by mouth once a week. One capsule once weekly  . esomeprazole (NEXIUM) 40 MG capsule Take 1 capsule (40 mg total) by mouth daily.  . fluconazole (DIFLUCAN) 150 MG tablet Take one tablet by mouth once daily, as needed, for vaginal itch associated with whig dose prednisone and diabetes  . fluticasone (FLONASE) 50 MCG/ACT nasal spray Place 2 sprays into both nostrils daily. (Patient taking differently: Place 2 sprays into both nostrils daily as needed for allergies. )  . gabapentin (NEURONTIN) 800 MG tablet TAKE 1 TABLET BY MOUTH FOUR TIMES A DAY  . glipiZIDE (GLUCOTROL) 10 MG tablet Take 10 mg by mouth daily before breakfast.  . glucose blood test strip Use as instructed four times daily testing dx e11.65  . hydrALAZINE (APRESOLINE) 25 MG tablet Take by mouth.  . hydrOXYzine (ATARAX/VISTARIL) 10 MG tablet Take 10-30 mg by mouth at bedtime.   . Insulin Glargine (LANTUS SOLOSTAR) 100 UNIT/ML Solostar Pen Inject 60 Units into the skin at bedtime.  . Insulin Pen Needle (B-D ULTRAFINE III SHORT PEN) 31G X 8 MM MISC 1 each by Does not apply route as directed.  Marland Kitchen ipratropium-albuterol (DUONEB) 0.5-2.5 (3) MG/3ML  SOLN Take  3 mLs by nebulization every 6 (six) hours as needed. (Patient taking differently: Take 3 mLs by nebulization every 6 (six) hours as needed (SOB). )  . ipratropium-albuterol (DUONEB) 0.5-2.5 (3) MG/3ML SOLN Take 3 mLs by nebulization every 6 (six) hours as needed.  Marland Kitchen KLOR-CON M20 20 MEQ tablet TAKE 1 TABLET BY MOUTH TWICE A DAY  . Lancets (ONETOUCH DELICA PLUS [id number]) MISC TEST ONCE DAILY  . levothyroxine (SYNTHROID) 50 MCG tablet TAKE 1 TABLET BY MOUTH DAILY BEFORE BREAKFAST  . loratadine (CLARITIN) 10 MG tablet Take 1 tablet (10 mg total) by mouth daily.  . meclizine (ANTIVERT) 25 MG tablet Take 25 mg by mouth 3 (three) times daily as needed for dizziness.  . metFORMIN (GLUCOPHAGE) 1000 MG tablet Take 1 tablet (1,000 mg total) by mouth 2 (two) times daily.  . montelukast (SINGULAIR) 10 MG tablet TAKE 1 TABLET (10 MG TOTAL) BY MOUTH AT BEDTIME.  . nitroGLYCERIN (NITROSTAT) 0.4 MG SL tablet Place 1 tablet (0.4 mg total) under the tongue every 5 (five) minutes as needed for chest pain.  Marland Kitchen ondansetron (ZOFRAN) 4 MG tablet Take 1 tablet (4 mg total) by mouth every 8 (eight) hours as needed for nausea or vomiting.  Marland Kitchen oxyCODONE (OXYCONTIN) 40 mg 12 hr tablet Take one tablet by mouth two times daily for back pain  . predniSONE (STERAPRED UNI-PAK 21 TAB) 5 MG (21) TBPK tablet Take 1 tablet (5 mg total) by mouth as directed. Use as directed  . rosuvastatin (CRESTOR) 40 MG tablet TAKE 1 TABLET BY MOUTH EVERY DAY  . SYMBICORT 160-4.5 MCG/ACT inhaler TAKE 2 PUFFS BY MOUTH TWICE A DAY  . triamterene-hydrochlorothiazide (MAXZIDE) 75-50 MG tablet Take 1 tablet by mouth daily.  . metoprolol tartrate (LOPRESSOR) 25 MG tablet Take 1 tablet (25 mg total) by mouth 2 (two) times daily.  Marland Kitchen zolpidem (AMBIEN) 10 MG tablet Take 1 tablet (10 mg total) by mouth at bedtime.   No facility-administered encounter medications on file as of 04/15/2019.    Activities of Daily Living In your present state of  health, do you have any difficulty performing the following activities: 04/15/2019 05/06/2018  Hearing? N -  Vision? N -  Difficulty concentrating or making decisions? N -  Walking or climbing stairs? Y -  Dressing or bathing? Y -  Doing errands, shopping? Y N  Preparing Food and eating ? N -  Using the Toilet? N -  In the past six months, have you accidently leaked urine? N -  Do you have problems with loss of bowel control? N -  Managing your Medications? Y -  Managing your Finances? N -  Housekeeping or managing your Housekeeping? Y -  Some recent data might be hidden    Patient Care Team: Fayrene Helper, MD as PCP - General Elouise Munroe, MD as PCP - Cardiology (Cardiology) Danie Binder, MD (Gastroenterology) Rolan Bucco, MD as Attending Physician (Urology) Sydnee Cabal, MD as Consulting Physician (Orthopedic Surgery) Essenmacher, Clarene Duke, OT as Occupational Therapist (Occupational Therapy)    Assessment:   This is a routine wellness examination for Angela.  Exercise Activities and Dietary recommendations Current Exercise Habits: The patient does not participate in regular exercise at present, Exercise limited by: cardiac condition(s)  Goals    . DIET - DECREASE SODA OR JUICE INTAKE    . Exercise 3x per week (30 min per time)     Patient would like to start walking 3 times a week for 60  minutes at a time.       Fall Risk Fall Risk  04/15/2019 04/02/2019 01/30/2019 01/01/2019 12/25/2018  Falls in the past year? 1 0 1 1 0  Number falls in past yr: 1 - 0 1 0  Injury with Fall? 0 - 1 0 0  Risk for fall due to : - - - - -  Follow up - Falls evaluation completed - - -   Is the patient's home free of loose throw rugs in walkways, pet beds, electrical cords, etc?   yes      Grab bars in the bathroom? yes      Handrails on the stairs?   yes      Adequate lighting?   yes    Depression Screen PHQ 2/9 Scores 04/15/2019 01/30/2019 12/25/2018 11/05/2018  PHQ -  2 Score 0 0 0 0  PHQ- 9 Score - - - -     Cognitive Function     6CIT Screen 04/15/2019 04/10/2018 05/03/2016  What Year? 0 points 0 points 0 points  What month? 0 points 0 points 0 points  What time? 0 points 0 points 0 points  Count back from 20 0 points 0 points 0 points  Months in reverse 0 points 0 points 0 points  Repeat phrase 0 points 4 points 0 points  Total Score 0 4 0    Immunization History  Administered Date(s) Administered  . Influenza Split 03/30/2011  . Influenza Whole 03/21/2005, 02/09/2006  . Influenza,inj,Quad PF,6+ Mos 02/11/2014, 07/27/2015, 03/09/2016, 01/29/2017, 02/07/2018, 01/30/2019  . Pneumococcal Polysaccharide-23 04/17/2006, 09/14/2015  . Td 04/17/2006    Qualifies for Shingles Vaccine?  Declined at this time   Screening Tests Health Maintenance  Topic Date Due  . COLONOSCOPY  05/05/2010  . TETANUS/TDAP  04/17/2016  . PAP SMEAR-Modifier  01/28/2017  . OPHTHALMOLOGY EXAM  03/07/2017  . HEMOGLOBIN A1C  10/01/2019  . FOOT EXAM  01/30/2020  . MAMMOGRAM  04/06/2021  . INFLUENZA VACCINE  Completed  . PNEUMOCOCCAL POLYSACCHARIDE VACCINE AGE 64-64 HIGH RISK  Completed  . Hepatitis C Screening  Completed  . HIV Screening  Completed    Cancer Screenings: Lung: Low Dose CT Chest recommended if Age 8-80 years, 30 pack-year currently smoking OR have quit w/in 15years. Patient does not qualify. Breast:  Up to date on Mammogram? Yes   Up to date of Bone Density/Dexa? n/a Colorectal:    Additional Screenings:   Hepatitis C Screening:  completed     Plan:     1. Encounter for Medicare annual wellness exam   I have personally reviewed and noted the following in the patient's chart:   . Medical and social history . Use of alcohol, tobacco or illicit drugs  . Current medications and supplements . Functional ability and status . Nutritional status . Physical activity . Advanced directives . List of other physicians . Hospitalizations,  surgeries, and ER visits in previous 12 months . Vitals . Screenings to include cognitive, depression, and falls . Referrals and appointments  In addition, I have reviewed and discussed with patient certain preventive protocols, quality metrics, and best practice recommendations. A written personalized care plan for preventive services as well as general preventive health recommendations were provided to patient.     I provided 20 minutes of non-face-to-face time during this encounter.    Perlie Mayo, NP  04/15/2019

## 2019-04-15 NOTE — Patient Instructions (Signed)
Angela [name] , Thank you for taking time to come for your Medicare Wellness Visit. I appreciate your ongoing commitment to your health goals. Please review the following plan we discussed and let me know if I can assist you in the future.   Please continue to practice social distancing to keep you, your family, and our community safe.  If you must go out, please wear a Mask and practice good handwashing.  We hope you have a happy, safe, and healthy holiday season.  Screening recommendations/referrals: Colonoscopy: Reschedule when she can Mammogram: Up-to-date Bone Density: Will be due age 65 Recommended yearly ophthalmology/optometry visit for glaucoma screening and checkup Recommended yearly dental visit for hygiene and checkup  Vaccinations: Influenza vaccine: Up-to-date Pneumococcal vaccine: Up-to-date  Tdap vaccine: Due  Shingles vaccine: Declined at this time  Advanced directives: Completed, please make sure that we have copies in the chart.  Conditions/risks identified: Falls, shortness of breath  Next appointment: 06/25/2019  Preventive Care 40-64 Years, Female Preventive care refers to lifestyle choices and visits with your health care provider that can promote health and wellness. What does preventive care include?  A yearly physical exam. This is also called an annual well check.  Dental exams once or twice a year.  Routine eye exams. Ask your health care provider how often you should have your eyes checked.  Personal lifestyle choices, including:  Daily care of your teeth and gums.  Regular physical activity.  Eating a healthy diet.  Avoiding tobacco and drug use.  Limiting alcohol use.  Practicing safe sex.  Taking low-dose aspirin daily starting at age 50.  Taking vitamin and mineral supplements as recommended by your health care provider. What happens during an annual well check? The services and screenings done by your health care provider during your  annual well check will depend on your age, overall health, lifestyle risk factors, and family history of disease. Counseling  Your health care provider may ask you questions about your:  Alcohol use.  Tobacco use.  Drug use.  Emotional well-being.  Home and relationship well-being.  Sexual activity.  Eating habits.  Work and work environment.  Method of birth control.  Menstrual cycle.  Pregnancy history. Screening  You may have the following tests or measurements:  Height, weight, and BMI.  Blood pressure.  Lipid and cholesterol levels. These may be checked every 5 years, or more frequently if you are over 50 years old.  Skin check.  Lung cancer screening. You may have this screening every year starting at age 55 if you have a 30-pack-year history of smoking and currently smoke or have quit within the past 15 years.  Fecal occult blood test (FOBT) of the stool. You may have this test every year starting at age 50.  Flexible sigmoidoscopy or colonoscopy. You may have a sigmoidoscopy every 5 years or a colonoscopy every 10 years starting at age 50.  Hepatitis C blood test.  Hepatitis B blood test.  Sexually transmitted disease (STD) testing.  Diabetes screening. This is done by checking your blood sugar (glucose) after you have not eaten for a while (fasting). You may have this done every 1-3 years.  Mammogram. This may be done every 1-2 years. Talk to your health care provider about when you should start having regular mammograms. This may depend on whether you have a family history of breast cancer.  BRCA-related cancer screening. This may be done if you have a family history of breast, ovarian, tubal, or peritoneal   cancers.  Pelvic exam and Pap test. This may be done every 3 years starting at age 23. Starting at age 10, this may be done every 5 years if you have a Pap test in combination with an HPV test.  Bone density scan. This is done to screen for  osteoporosis. You may have this scan if you are at high risk for osteoporosis. Discuss your test results, treatment options, and if necessary, the need for more tests with your health care provider. Vaccines  Your health care provider may recommend certain vaccines, such as:  Influenza vaccine. This is recommended every year.  Tetanus, diphtheria, and acellular pertussis (Tdap, Td) vaccine. You may need a Td booster every 10 years.  Zoster vaccine. You may need this after age 109.  Pneumococcal 13-valent conjugate (PCV13) vaccine. You may need this if you have certain conditions and were not previously vaccinated.  Pneumococcal polysaccharide (PPSV23) vaccine. You may need one or two doses if you smoke cigarettes or if you have certain conditions. Talk to your health care provider about which screenings and vaccines you need and how often you need them. This information is not intended to replace advice given to you by your health care provider. Make sure you discuss any questions you have with your health care provider. Document Released: 05/14/2015 Document Revised: 01/05/2016 Document Reviewed: 02/16/2015 Elsevier Interactive Patient Education  2017 [place] Prevention in the Home Falls can cause injuries. They can happen to people of all ages. There are many things you can do to make your home safe and to help prevent falls. What can I do on the outside of my home?  Regularly fix the edges of walkways and driveways and fix any cracks.  Remove anything that might make you trip as you walk through a door, such as a raised step or threshold.  Trim any bushes or trees on the path to your home.  Use bright outdoor lighting.  Clear any walking paths of anything that might make someone trip, such as rocks or tools.  Regularly check to see if handrails are loose or broken. Make sure that both sides of any steps have handrails.  Any raised decks and porches should have  guardrails on the edges.  Have any leaves, snow, or ice cleared regularly.  Use sand or salt on walking paths during winter.  Clean up any spills in your garage right away. This includes oil or grease spills. What can I do in the bathroom?  Use night lights.  Install grab bars by the toilet and in the tub and shower. Do not use towel bars as grab bars.  Use non-skid mats or decals in the tub or shower.  If you need to sit down in the shower, use a plastic, non-slip stool.  Keep the floor dry. Clean up any water that spills on the floor as soon as it happens.  Remove soap buildup in the tub or shower regularly.  Attach bath mats securely with double-sided non-slip rug tape.  Do not have throw rugs and other things on the floor that can make you trip. What can I do in the bedroom?  Use night lights.  Make sure that you have a light by your bed that is easy to reach.  Do not use any sheets or blankets that are too big for your bed. They should not hang down onto the floor.  Have a firm chair that has side arms. You can use  this for support while you get dressed.  Do not have throw rugs and other things on the floor that can make you trip. What can I do in the kitchen?  Clean up any spills right away.  Avoid walking on wet floors.  Keep items that you use a lot in easy-to-reach places.  If you need to reach something above you, use a strong step stool that has a grab bar.  Keep electrical cords out of the way.  Do not use floor polish or wax that makes floors slippery. If you must use wax, use non-skid floor wax.  Do not have throw rugs and other things on the floor that can make you trip. What can I do with my stairs?  Do not leave any items on the stairs.  Make sure that there are handrails on both sides of the stairs and use them. Fix handrails that are broken or loose. Make sure that handrails are as long as the stairways.  Check any carpeting to make sure that  it is firmly attached to the stairs. Fix any carpet that is loose or worn.  Avoid having throw rugs at the top or bottom of the stairs. If you do have throw rugs, attach them to the floor with carpet tape.  Make sure that you have a light switch at the top of the stairs and the bottom of the stairs. If you do not have them, ask someone to add them for you. What else can I do to help prevent falls?  Wear shoes that:  Do not have high heels.  Have rubber bottoms.  Are comfortable and fit you well.  Are closed at the toe. Do not wear sandals.  If you use a stepladder:  Make sure that it is fully opened. Do not climb a closed stepladder.  Make sure that both sides of the stepladder are locked into place.  Ask someone to hold it for you, if possible.  Clearly mark and make sure that you can see:  Any grab bars or handrails.  First and last steps.  Where the edge of each step is.  Use tools that help you move around (mobility aids) if they are needed. These include:  Canes.  Walkers.  Scooters.  Crutches.  Turn on the lights when you go into a dark area. Replace any light bulbs as soon as they burn out.  Set up your furniture so you have a clear path. Avoid moving your furniture around.  If any of your floors are uneven, fix them.  If there are any pets around you, be aware of where they are.  Review your medicines with your doctor. Some medicines can make you feel dizzy. This can increase your chance of falling. Ask your doctor what other things that you can do to help prevent falls. This information is not intended to replace advice given to you by your health care provider. Make sure you discuss any questions you have with your health care provider. Document Released: 02/11/2009 Document Revised: 09/23/2015 Document Reviewed: 05/22/2014 [employer] Interactive Patient Education  2017 [employer]   

## 2019-04-16 ENCOUNTER — Ambulatory Visit (INDEPENDENT_AMBULATORY_CARE_PROVIDER_SITE_OTHER): Payer: Medicare Other | Admitting: "Endocrinology

## 2019-04-16 ENCOUNTER — Encounter: Payer: Self-pay | Admitting: "Endocrinology

## 2019-04-16 ENCOUNTER — Other Ambulatory Visit: Payer: Self-pay

## 2019-04-16 DIAGNOSIS — E1165 Type 2 diabetes mellitus with hyperglycemia: Secondary | ICD-10-CM

## 2019-04-16 DIAGNOSIS — I10 Essential (primary) hypertension: Secondary | ICD-10-CM

## 2019-04-16 DIAGNOSIS — E782 Mixed hyperlipidemia: Secondary | ICD-10-CM | POA: Diagnosis not present

## 2019-04-16 DIAGNOSIS — E039 Hypothyroidism, unspecified: Secondary | ICD-10-CM | POA: Diagnosis not present

## 2019-04-16 MED ORDER — LANTUS SOLOSTAR 100 UNIT/ML ~~LOC~~ SOPN: 80.0000 [IU] | PEN_INJECTOR | Freq: Every day | SUBCUTANEOUS | 2 refills | Status: DC

## 2019-04-16 NOTE — Progress Notes (Signed)
04/16/2019, 4:28 PM                                                                       Endocrinology Telehealth Visit Follow up Note -During COVID -19 Pandemic  This visit type was conducted due to national recommendations for restrictions regarding the COVID-19 Pandemic  in an effort to limit this patient's exposure and mitigate transmission of the corona virus.  Due to her co-morbid illnesses, Angela [name] is at  moderate to high risk for complications without adequate follow up.  This format is felt to be most appropriate for her at this time.  I connected with this patient on 04/16/2019   by telephone and verified that I am speaking with the correct person using two identifiers. Angela [name], 1961-02-20. she has verbally consented to this visit. All issues noted in this document were discussed and addressed. The format was not optimal for physical exam.     Subjective:    [name], female    DOB: 21-May-1960.  Angela [name] is being engaged in telehealth via telephone in follow-up for management of currently uncontrolled symptomatic diabetes requested by  Fayrene Helper, MD.   Past Medical History:  Diagnosis Date  . Anxiety   . Arthritis   . Asthma   . Coronary atherosclerosis of native coronary artery    Mild nonobstructive CAD by cardiac catheterization 2008 - Dr. Terrence Dupont  . Degenerative disc disease   . Depression   . Dysrhythmia   . Essential hypertension, benign   . Folliculitis 12/09/5724  . GERD (gastroesophageal reflux disease)   . H/O hiatal hernia   . Helicobacter pylori gastritis 2008  . Hypercholesterolemia   . Hypertension   . Hyperthyroidism    s/p radiation  . Left-sided epistaxis 05/15/2018  . Low back pain   . Migraine   . Nephrolithiasis    Recurring episodes since 2004  . Severe obesity (BMI >= 40) (HCC)   . Sleep apnea    STOP BANG SCORE 6no cpap used  . Type 2 diabetes mellitus with diabetic  neuropathy [facility])     Past Surgical History:  Procedure Laterality Date  . ABDOMINAL HYSTERECTOMY    . BACK SURGERY  1993  . BACK SURGERY  1994  . BACK SURGERY  2002  . BACK SURGERY  2011   [place] Left   . CHOLECYSTECTOMY  1996  . COLONOSCOPY  2000 [facility]   INT HEMORRHOIDS/FISSURE  . COLONOSCOPY  2003 [facility], CHANGE IN BOWEL HABITS   INT HEMORRHOIDS  . COLONOSCOPY  2006 [facility]   INT HEMORRHOIDS  . COLONOSCOPY  2007 [facility]   INT HEMORRHOIDS  . CYSTOSCOPY WITH STENT PLACEMENT Left 07/25/2012   Procedure: CYSTOSCOPY, left retograde pyelogram WITH left ureteral  STENT PLACEMENT;  Surgeon: Claybon Jabs, MD;  Location: [facility];  Service: Urology;  Laterality: Left;  . CYSTOSCOPY/RETROGRADE/URETEROSCOPY  12/04/2011   Procedure: CYSTOSCOPY/RETROGRADE/URETEROSCOPY;  Surgeon: Molli Hazard, MD;  Location: [facility];  Service: Urology;  Laterality: Right;  . CYSTOSCOPY/RETROGRADE/URETEROSCOPY/STONE EXTRACTION WITH BASKET Left 09/03/2012   Procedure: CYSTOSCOPY/RETROGRADE pyelogram/digital URETEROSCOPY/STONE EXTRACTION WITH BASKET, left stent removal;  Surgeon: Dennard Schaumann  Jasmine December, MD;  Location: [facility];  Service: Urology;  Laterality: Left;  . ESOPHAGOGASTRODUODENOSCOPY  2008   [facility]:QQPYP hiatal hernia./Normal esophagus without evidence of Barrett's mass, stricture, erosion or ulceration./Normal duodenal bulb and second portion of the duodenum./Diffuse erythema in the body and the antrum with occasional erosion.  Biopsies obtained via cold forceps to evaluate for H. pylori gastritis  . FRACTURE SURGERY  1999   right clavicle  . HOLMIUM LASER APPLICATION Left 01/04/931   Procedure: HOLMIUM LASER APPLICATION;  Surgeon: Molli Hazard, MD;  Location: [facility];  Service: Urology;  Laterality: Left;  . KNEE ARTHROSCOPY  10/04/2010,    right knee arthroscopy, dr Theda Sers  . LEFT HEART CATH AND CORONARY ANGIOGRAPHY N/A 05/08/2018   Procedure: LEFT HEART CATH AND  CORONARY ANGIOGRAPHY;  Surgeon: Troy Sine, MD;  Location: [place] [facility];  Service: Cardiovascular;  Laterality: N/A;  . left knee arthroscopic surgery  1999  . Left salphingectomy secondary to ectopic pregnancy  1991  . PARTIAL HYSTERECTOMY  1991  . rotary cuff  Right 05/14/2014   [facility]  . UPPER GASTROINTESTINAL ENDOSCOPY  2008 abd pain   H. pylori gastritis  . UPPER GASTROINTESTINAL ENDOSCOPY  1996 [place] [place]   PUD    Social History   Socioeconomic History  . Marital status: Single    Spouse name: Not on file  . Number of children: 1  . Years of education: 69  . Highest education level: 12th grade  Occupational History  . Occupation: Disable   Tobacco Use  . Smoking status: Never Smoker  . Smokeless tobacco: Never Used  Substance and Sexual Activity  . Alcohol use: No  . Drug use: No  . Sexual activity: Not Currently    Partners: Male    Birth control/protection: Surgical    Comment: hyst  Other Topics Concern  . Not on file  Social History Narrative   Lives alone    Social Determinants of Health   Financial Resource Strain:   . Difficulty of Paying Living Expenses: Not on file  Food Insecurity:   . Worried About Charity fundraiser in the Last Year: Not on file  . Ran Out of Food in the Last Year: Not on file  Transportation Needs:   . Lack of Transportation (Medical): Not on file  . Lack of Transportation (Non-Medical): Not on file  Physical Activity:   . Days of Exercise per Week: Not on file  . Minutes of Exercise per Session: Not on file  Stress:   . Feeling of Stress : Not on file  Social Connections:   . Frequency of Communication with Friends and Family: Not on file  . Frequency of Social Gatherings with Friends and Family: Not on file  . Attends Religious Services: Not on file  . Active Member of Clubs or Organizations: Not on file  . Attends Archivist Meetings: Not on file  . Marital Status: Not on file    Family History   Problem Relation Age of Onset  . Heart disease Mother   . Hypertension Mother   . Cancer Mother        Cervical   . Asthma Mother   . Heart attack Father   . Cancer Father        Prostate  . Colon cancer Father        DECEASED AGE 82  . Colon polyps Neg Hx     Outpatient Encounter Medications as of 04/16/2019  Medication Sig  . albuterol (PROAIR HFA) 108 (90 Base) MCG/ACT inhaler INHALE 2 PUFFS BY MOUTH EVERY 6-8HRS AS NEEDED (Patient taking differently: Inhale 2 puffs into the lungs every 6 (six) hours as needed for wheezing or shortness of breath. )  . amLODipine (NORVASC) 10 MG tablet Take 1 tablet (10 mg total) by mouth daily.  . blood glucose meter kit and supplies Dispense based on patient and insurance preference. Once daily testing dx. e11.9  . calcipotriene (DOVONOX) 0.005 % ointment APPLY TO AFFECTED AREA OF LEGS AND ELBOWS DAILY  . carvedilol (COREG) 12.5 MG tablet TAKE 1 TABLET (12.5 MG TOTAL) BY MOUTH 2 (TWO) TIMES DAILY.  . cloNIDine (CATAPRES - DOSED IN MG/24 HR) 0.3 mg/24hr patch APPLY 1 PATCH TO SKIN ONCE A WEEK  . cloNIDine (CATAPRES) 0.3 MG tablet TAKE 1 TABLET (0.3 MG TOTAL) BY MOUTH AT BEDTIME.  . cyclobenzaprine (FLEXERIL) 10 MG tablet TAKE 1 TABLET BY MOUTH THREE TIMES A DAY  . diazepam (VALIUM) 5 MG tablet Take one tablet by mouth three times daily for back spasm  . DULoxetine (CYMBALTA) 60 MG capsule TAKE 1 CAPSULE BY MOUTH TWICE A DAY (Patient taking differently: Take 60 mg by mouth 2 (two) times daily. )  . ergocalciferol (VITAMIN D2) 50000 units capsule Take 1 capsule (50,000 Units total) by mouth once a week. One capsule once weekly  . esomeprazole (NEXIUM) 40 MG capsule Take 1 capsule (40 mg total) by mouth daily.  . fluconazole (DIFLUCAN) 150 MG tablet Take one tablet by mouth once daily, as needed, for vaginal itch associated with whig dose prednisone and diabetes  . fluticasone (FLONASE) 50 MCG/ACT nasal spray Place 2 sprays into both nostrils daily.  (Patient taking differently: Place 2 sprays into both nostrils daily as needed for allergies. )  . gabapentin (NEURONTIN) 800 MG tablet TAKE 1 TABLET BY MOUTH FOUR TIMES A DAY  . glipiZIDE (GLUCOTROL) 10 MG tablet Take 10 mg by mouth daily before breakfast.  . glucose blood test strip Use as instructed four times daily testing dx e11.65  . hydrALAZINE (APRESOLINE) 25 MG tablet Take by mouth.  . hydrOXYzine (ATARAX/VISTARIL) 10 MG tablet Take 10-30 mg by mouth at bedtime.   . Insulin Glargine (LANTUS SOLOSTAR) 100 UNIT/ML Solostar Pen Inject 80 Units into the skin at bedtime.  . Insulin Pen Needle (B-D ULTRAFINE III SHORT PEN) 31G X 8 MM MISC 1 each by Does not apply route as directed.  Marland Kitchen ipratropium-albuterol (DUONEB) 0.5-2.5 (3) MG/3ML SOLN Take 3 mLs by nebulization every 6 (six) hours as needed. (Patient taking differently: Take 3 mLs by nebulization every 6 (six) hours as needed (SOB). )  . ipratropium-albuterol (DUONEB) 0.5-2.5 (3) MG/3ML SOLN Take 3 mLs by nebulization every 6 (six) hours as needed.  Marland Kitchen KLOR-CON M20 20 MEQ tablet TAKE 1 TABLET BY MOUTH TWICE A DAY  . Lancets (ONETOUCH DELICA PLUS [id number]) MISC TEST ONCE DAILY  . levothyroxine (SYNTHROID) 50 MCG tablet TAKE 1 TABLET BY MOUTH DAILY BEFORE BREAKFAST  . loratadine (CLARITIN) 10 MG tablet Take 1 tablet (10 mg total) by mouth daily.  . meclizine (ANTIVERT) 25 MG tablet Take 25 mg by mouth 3 (three) times daily as needed for dizziness.  . metFORMIN (GLUCOPHAGE) 1000 MG tablet Take 1 tablet (1,000 mg total) by mouth 2 (two) times daily.  . metoprolol tartrate (LOPRESSOR) 25 MG tablet Take 1 tablet (25 mg total) by mouth 2 (two) times daily.  . montelukast (SINGULAIR) 10 MG tablet TAKE  1 TABLET (10 MG TOTAL) BY MOUTH AT BEDTIME.  . nitroGLYCERIN (NITROSTAT) 0.4 MG SL tablet Place 1 tablet (0.4 mg total) under the tongue every 5 (five) minutes as needed for chest pain.  Marland Kitchen ondansetron (ZOFRAN) 4 MG tablet Take 1 tablet (4 mg total) by  mouth every 8 (eight) hours as needed for nausea or vomiting.  Marland Kitchen oxyCODONE (OXYCONTIN) 40 mg 12 hr tablet Take one tablet by mouth two times daily for back pain  . predniSONE (STERAPRED UNI-PAK 21 TAB) 5 MG (21) TBPK tablet Take 1 tablet (5 mg total) by mouth as directed. Use as directed  . rosuvastatin (CRESTOR) 40 MG tablet TAKE 1 TABLET BY MOUTH EVERY DAY  . SYMBICORT 160-4.5 MCG/ACT inhaler TAKE 2 PUFFS BY MOUTH TWICE A DAY  . triamterene-hydrochlorothiazide (MAXZIDE) 75-50 MG tablet Take 1 tablet by mouth daily.  Marland Kitchen zolpidem (AMBIEN) 10 MG tablet Take 1 tablet (10 mg total) by mouth at bedtime.  . [DISCONTINUED] Insulin Glargine (LANTUS SOLOSTAR) 100 UNIT/ML Solostar Pen Inject 60 Units into the skin at bedtime.   No facility-administered encounter medications on file as of 04/16/2019.    ALLERGIES: Allergies  Allergen Reactions  . Morphine Other (See Comments)    Reaction with esophagus, unable to swallow.   . Ace Inhibitors Cough  . Aspirin Other (See Comments)    Reaction with esophagus, unable to swallow.   . Losartan Cough  . Tapentadol Other (See Comments)    Nausea, increased sleepiness, h/a  . Tomato Other (See Comments)    Acid reflux due to acid in tomato  . Adhesive [Tape] Rash    VACCINATION STATUS: Immunization History  Administered Date(s) Administered  . Influenza Split 03/30/2011  . Influenza Whole 03/21/2005, 02/09/2006  . Influenza,inj,Quad PF,6+ Mos 02/11/2014, 07/27/2015, 03/09/2016, 01/29/2017, 02/07/2018, 01/30/2019  . Pneumococcal Polysaccharide-23 04/17/2006, 09/14/2015  . Td 04/17/2006    Diabetes She presents for her follow-up diabetic visit. She has type 2 diabetes mellitus. Her disease course has been worsening. There are no hypoglycemic associated symptoms. Pertinent negatives for hypoglycemia include no confusion, headaches, pallor or seizures. Associated symptoms include fatigue. Pertinent negatives for diabetes include no blurred vision, no  chest pain, no polydipsia, no polyphagia and no polyuria. There are no hypoglycemic complications. Symptoms are improving. Diabetic complications include heart disease. Risk factors for coronary artery disease include dyslipidemia, diabetes mellitus, family history, hypertension, obesity, sedentary lifestyle and tobacco exposure. Current diabetic treatment includes oral agent (dual therapy). She is following a generally unhealthy diet. When asked about meal planning, she reported none. She has had a previous visit with a dietitian. She never participates in exercise. Her home blood glucose trend is increasing steadily. Her breakfast blood glucose range is generally 180-200 mg/dl. Her lunch blood glucose range is generally 180-200 mg/dl. Her dinner blood glucose range is generally 180-200 mg/dl. Her bedtime blood glucose range is generally >200 mg/dl. Her overall blood glucose range is 180-200 mg/dl. (She has documented improving glycemic profile, still above target both fasting and postprandial.) An ACE inhibitor/angiotensin II receptor blocker is being taken. She does not see a podiatrist.Eye exam is not current.  Hyperlipidemia This is a chronic problem. The current episode started more than 1 year ago. The problem is controlled. Exacerbating diseases include diabetes and obesity. Associated symptoms include shortness of breath. Pertinent negatives include no chest pain or myalgias. Risk factors for coronary artery disease include diabetes mellitus, dyslipidemia, hypertension, obesity and a sedentary lifestyle.  Hypertension This is a chronic problem. The  current episode started more than 1 year ago. The problem is uncontrolled. Associated symptoms include shortness of breath. Pertinent negatives include no blurred vision, chest pain, headaches or palpitations. Risk factors for coronary artery disease include family history, dyslipidemia, diabetes mellitus, obesity, sedentary lifestyle and post-menopausal  state. Past treatments include angiotensin blockers. Hypertensive end-organ damage includes CAD/MI and heart failure.   Review of systems: Limited as above.  Objective:    There were no vitals taken for this visit.  Wt Readings from Last 3 Encounters:  04/15/19 (!) 323 lb (146.5 kg)  04/08/19 (!) 323 lb (146.5 kg)  04/02/19 (!) 323 lb (146.5 kg)     CMP ( most recent) CMP     Component Value Date/Time   NA 139 12/19/2018 1321   K 4.1 12/19/2018 1321   CL 101 12/19/2018 1321   CO2 29 12/19/2018 1321   GLUCOSE 228 (H) 12/19/2018 1321   BUN 14 12/19/2018 1321   CREATININE 0.96 12/19/2018 1321   CALCIUM 9.8 12/19/2018 1321   PROT 7.3 12/19/2018 1321   ALBUMIN 3.9 05/05/2018 2250   AST 24 12/19/2018 1321   ALT 28 12/19/2018 1321   ALKPHOS 63 05/05/2018 2250   BILITOT 0.4 12/19/2018 1321   GFRNONAA 65 12/19/2018 1321   GFRAA 76 12/19/2018 1321    Diabetic Labs (most recent): Lab Results  Component Value Date   HGBA1C 13.0 (A) 04/02/2019   HGBA1C 11.9 (H) 12/19/2018   HGBA1C >14.0 (H) 08/28/2018     Lipid Panel ( most recent) Lipid Panel     Component Value Date/Time   CHOL 90 08/28/2018 0903   TRIG 179 (H) 08/28/2018 0903   HDL 31 (L) 08/28/2018 0903   CHOLHDL 2.9 08/28/2018 0903   VLDL 16 05/06/2018 0335   LDLCALC 33 08/28/2018 0903      Assessment & Plan:   1. Uncontrolled type 2 diabetes mellitus with hyperglycemia ([place])   - [place] has currently uncontrolled symptomatic type 2 DM since  58 years of age.  -She reports progressively improving glycemic profile, her recent point-of-care A1c was 13%.    -  She did have A1c of greater than 14% last year.     Recent labs reviewed.  -her diabetes is complicated by coronary artery disease, obesity/sedentary life and she remains at a high risk for more acute and chronic complications which include CAD, CVA, CKD, retinopathy, and neuropathy. These are all discussed in detail with her.  - I have  counseled her on diet management and weight loss, by adopting a carbohydrate restricted/protein rich diet.  - she  admits there is a room for improvement in her diet and drink choices. -  Suggestion is made for her to avoid simple carbohydrates  from her diet including Cakes, Sweet Desserts / Pastries, Ice Cream, Soda (diet and regular), Sweet Tea, Candies, Chips, Cookies, Sweet Pastries,  Store Bought Juices, Alcohol in Excess of  1-2 drinks a day, Artificial Sweeteners, Coffee Creamer, and "Sugar-free" Products. This will help patient to have stable blood glucose profile and potentially avoid unintended weight gain.  - I encouraged her to switch to  unprocessed or minimally processed complex starch and increased protein intake (animal or plant source), fruits, and vegetables.  - she is advised to stick to a routine mealtimes to eat 3 meals  a day and avoid unnecessary snacks ( to snack only to correct hypoglycemia).   - she has been scheduled with Jearld Fenton, RDN, CDE for individualized diabetes education.  -  I have approached her with the following individualized plan to manage diabetes and patient agrees:   -Based on her reported improvement in her glycemic profile, she will not need prandial insulin for now.   -However, she will need a higher dose of basal insulin.  I discussed and increased her Lantus to 80 units nightly, continue strict monitoring of blood glucose 2 times a day-daily before breakfast and at bedtime.   -She will likely need prandial insulin in order for her to achieve and maintain control of diabetes to target.  - she is warned not to take insulin without proper monitoring per orders.  - she is encouraged to call clinic for blood glucose levels less than 70 or above 300 mg /dl. - she is advised to continue metformin 1000 mg p.o. twice daily after breakfast and after supper, and glipizide  10 mg XL daily at breakfast only.  - she will be considered for incretin  therapy as appropriate next visit.   2) Blood Pressure /Hypertension:  she is advised to home monitor blood pressure and report if > 140/90 on 2 separate readings.   she is advised to continue her current medications including amlodipine 10 mg p.o. daily, carvedilol 12.5 mg p.o. twice daily, clonidine 0.3 mg/24-hour patch, clonidine 0.3 mg daily at bedtime, hydralazine 25 mg p.o. twice daily.  She will need work-up to rule out primary hypertension on subsequent visits.  3) Lipids/Hyperlipidemia:   Review of her recent lipid panel showed  controlled  LDL at 33.  she  is advised to continue Crestor 40 mg p.o. daily at bedtime.   Side effects and precautions discussed with her.  4)  Weight/Diet: She is obese, clearly complicating her diabetes care.  I discussed with her the fact that loss of 5 - 10% of her  current body weight will have the most impact on her diabetes management.  CDE Consult will be initiated . Exercise, and detailed carbohydrates information provided  -  detailed on discharge instructions.  5) hypothyroidism:   -She was recently initiated on levothyroxine 50 mcg daily before breakfast.   - We discussed about the correct intake of her thyroid hormone, on empty stomach at fasting, with water, separated by at least 30 minutes from breakfast and other medications,  and separated by more than 4 hours from calcium, iron, multivitamins, acid reflux medications (PPIs). -Patient is made aware of the fact that thyroid hormone replacement is needed for life, dose to be adjusted by periodic monitoring of thyroid function tests.   6) Chronic Care/Health Maintenance:  -she  is on Statin medications and  is encouraged to initiate and continue to follow up with Ophthalmology, Dentist,  Podiatrist at least yearly or according to recommendations, and advised to  stay away from smoking. I have recommended yearly flu vaccine and pneumonia vaccine at least every 5 years; moderate intensity  exercise for up to 150 minutes weekly; and  sleep for at least 7 hours a day.  - she is  advised to maintain close follow up with Fayrene Helper, MD for primary care needs, as well as her other providers for optimal and coordinated care.   - Patient Care Time Today:  25 min, of which >50% was spent in  counseling and the rest reviewing her  current and  previous labs/studies, previous treatments, her blood glucose readings, and medications' doses and developing a plan for long-term care based on the latest recommendations for standards of care.   [place]  participated in the discussions, expressed understanding, and voiced agreement with the above plans.  All questions were answered to her satisfaction. she is encouraged to contact clinic should she have any questions or concerns prior to her return visit.   Follow up plan: - Return in about 3 months (around 07/15/2019) for Bring Meter and Logs- A1c in Office.  Glade Lloyd, MD [facility] [address] [place], [place] [address] Phone: [phone]  Fax: [phone]    04/16/2019, 4:28 PM  This note was partially dictated with voice recognition software. Similar sounding words can be transcribed inadequately or may not  be corrected upon review.

## 2019-04-16 NOTE — Patient Instructions (Signed)

## 2019-04-17 ENCOUNTER — Ambulatory Visit: Payer: Medicare Other | Admitting: Nutrition

## 2019-04-24 ENCOUNTER — Encounter: Payer: Self-pay | Admitting: Family Medicine

## 2019-04-24 MED ORDER — OXYCODONE HCL ER 40 MG PO T12A: 40.0000 mg | EXTENDED_RELEASE_TABLET | Freq: Two times a day (BID) | ORAL | 0 refills | Status: DC

## 2019-04-24 MED ORDER — OXYCODONE HCL ER 40 MG PO T12A: 40.0000 mg | EXTENDED_RELEASE_TABLET | Freq: Two times a day (BID) | ORAL | 0 refills | Status: AC

## 2019-04-24 NOTE — Assessment & Plan Note (Signed)
Over 4 week h/o chest congestion, cough and wheezoing, not responding to treatment Depo medrol 80 mg iM followed by prednisone dose pack and referral to Pulmonary

## 2019-04-24 NOTE — Assessment & Plan Note (Signed)
  Patient re-educated about  the importance of commitment to a  minimum of 150 minutes of exercise per week as able.  The importance of healthy food choices with portion control discussed, as well as eating regularly and within a 12 hour window most days. The need to choose "clean , green" food 50 to 75% of the time is discussed, as well as to make water the primary drink and set a goal of 64 ounces water daily.    Weight /BMI 04/15/2019 04/08/2019 04/02/2019  WEIGHT 323 lb 323 lb 323 lb  HEIGHT 5\' 4"  5\' 4"  5\' 4"   BMI 55.44 kg/m2 55.44 kg/m2 55.44 kg/m2

## 2019-04-24 NOTE — Assessment & Plan Note (Signed)
Managed by Endo on supplemental medication

## 2019-04-24 NOTE — Assessment & Plan Note (Signed)
Sleep hygiene reviewed and written information offered also. Prescription sent for  medication needed.  

## 2019-04-24 NOTE — Assessment & Plan Note (Signed)
Controlled, no change in medication  

## 2019-04-24 NOTE — Assessment & Plan Note (Signed)
The patient's Controlled Substance registry is reviewed and compliance confirmed. Adequacy of  Pain control and level of function is assessed. Medication dosing is adjusted as deemed appropriate. Twelve weeks of medication is prescribed , patient signs for the script and is provided with a follow up appointment between 11 to 12 weeks .  

## 2019-04-24 NOTE — Assessment & Plan Note (Signed)
Uncontrolled, managed by Endo Angela [name] is reminded of the importance of commitment to daily physical activity for 30 minutes or more, as able and the need to limit carbohydrate intake to 30 to 60 grams per meal to help with blood sugar control.   The need to take medication as prescribed, test blood sugar as directed, and to call between visits if there is a concern that blood sugar is uncontrolled is also discussed.   Angela [name] is reminded of the importance of daily foot exam, annual eye examination, and good blood sugar, blood pressure and cholesterol control.  Diabetic Labs Latest Ref Rng & Units 04/02/2019 12/19/2018 08/28/2018 05/22/2018 05/09/2018  HbA1c 4.0 - 5.6 % 13.0(A) 11.9(H) >14.0(H) - -  Microalbumin mg/dL - - - - -  Micro/Creat Ratio <30 mcg/mg creat - - - - -  Chol <200 mg/dL - - 90 - -  HDL > OR = 50 mg/dL - - 31(L) - -  Calc LDL mg/dL (calc) - - 33 - -  Triglycerides <150 mg/dL - - 179(H) - -  Creatinine 0.50 - 1.05 mg/dL - 0.96 0.90 1.17(H) 0.81   BP/Weight 04/15/2019 04/08/2019 04/02/2019 01/30/2019 01/23/2019 [id number]  Systolic BP [id number]  Diastolic BP 82 82 71 76 88 98 88  Wt. (Lbs) 323 323 323 324 325 322.4 322.6  BMI 55.44 55.44 55.44 55.61 55.79 55.34 55.37   Foot/eye exam completion dates Latest Ref Rng & Units 01/30/2019 02/07/2018  Eye Exam No Retinopathy - -  Foot Form Completion - Done Done   detreriorated

## 2019-04-24 NOTE — Progress Notes (Signed)
Angela [name]     MRN: [id number]      DOB: March 04, 1961   HPI Angela [name] is here for follow up and re-evaluation of chronic medical conditions, medication management and review of any available recent lab and radiology data.  Preventive health is updated, specifically  Cancer screening and Immunization.   Questions or concerns regarding consultations or procedures which the PT has had in the interim are  addressed. The PT denies any adverse reactions to current medications since the last visit.  There are no new concerns.  There are no specific complaints   ROS Denies recent fever or chills. Denies sinus pressure, nasal congestion, ear pain or sore throat. Denies chest congestion, productive cough or wheezing. Increased dyspnea with exertion and exercise intolerance for past 6 weeks  Denies abdominal pain, nausea, vomiting,diarrhea or constipation.   Denies dysuria, frequency, hesitancy or incontinence. C/o chronic  joint pain, swelling and limitation in mobility. Denies headaches, seizures, numbness, or tingling. Denies depression, anxiety or insomnia. Denies skin break down or rash.   PE  BP 122/82   Pulse 84   Temp 98.5 F (36.9 C) (Temporal)   Ht 5\' 4"  (1.626 m)   Wt (!) 323 lb (146.5 kg)   SpO2 94%   BMI 55.44 kg/m   Patient alert and oriented and in no cardiopulmonary distress.  HEENT: No facial asymmetry, EOMI,     Neck supple .  Chest: decreased air entry, scattered wheezes  CVS: S1, S2 no murmurs, no S3.Regular rate.  ABD: Soft non tender.   Ext: one plus pitting  edema  MS: decreased ROM spine, hips and knees  Skin: Intact, no ulcerations or rash noted.  Psych: Good eye contact, normal affect. Memory intact not anxious or depressed appearing.  CNS: CN 2-12 intact, power,  normal throughout.no focal deficits noted.   Assessment & Plan  Wheezing 6 month h/o increased wheezing and SOB, sputum is clear, no fever or chills Depo medrol 80 mg iM in  office , followed by prednisone dpose pack, and needs pulmonary eval asap  Chronic diastolic heart failure (HCC) Increased dyspnea with activity , 6 pillow orthopnea in past 4 months, refer to card  Chronic bronchitis ([place]) Over 4 week h/o chest congestion, cough and wheezoing, not responding to treatment Depo medrol 80 mg iM followed by prednisone dose pack and referral to Pulmonary  Type 2 diabetes mellitus with vascular disease ([place]) Uncontrolled, managed by Endo Angela [name] is reminded of the importance of commitment to daily physical activity for 30 minutes or more, as able and the need to limit carbohydrate intake to 30 to 60 grams per meal to help with blood sugar control.   The need to take medication as prescribed, test blood sugar as directed, and to call between visits if there is a concern that blood sugar is uncontrolled is also discussed.   Angela [name] is reminded of the importance of daily foot exam, annual eye examination, and good blood sugar, blood pressure and cholesterol control.  Diabetic Labs Latest Ref Rng & Units 04/02/2019 12/19/2018 08/28/2018 05/22/2018 05/09/2018  HbA1c 4.0 - 5.6 % 13.0(A) 11.9(H) >14.0(H) - -  Microalbumin mg/dL - - - - -  Micro/Creat Ratio <30 mcg/mg creat - - - - -  Chol <200 mg/dL - - 90 - -  HDL > OR = 50 mg/dL - - 31(L) - -  Calc LDL mg/dL (calc) - - 33 - -  Triglycerides <150 mg/dL - - 179(H) - -  Creatinine 0.50 - 1.05 mg/dL - 0.96 0.90 1.17(H) 0.81   BP/Weight 04/15/2019 04/08/2019 04/02/2019 01/30/2019 01/23/2019 [id number]  Systolic BP 123XX123 [id number]  Diastolic BP 82 82 71 76 88 98 88  Wt. (Lbs) 323 323 323 324 325 322.4 322.6  BMI 55.44 55.44 55.44 55.61 55.79 55.34 55.37   Foot/eye exam completion dates Latest Ref Rng & Units 01/30/2019 02/07/2018  Eye Exam No Retinopathy - -  Foot Form Completion - Done Done   detreriorated     Hypothyroidism Managed by Endo on supplemental medication  Essential hypertension,  benign Controlled, no change in medication   Morbid obesity ([place])  Patient re-educated about  the importance of commitment to a  minimum of 150 minutes of exercise per week as able.  The importance of healthy food choices with portion control discussed, as well as eating regularly and within a 12 hour window most days. The need to choose "clean , green" food 50 to 75% of the time is discussed, as well as to make water the primary drink and set a goal of 64 ounces water daily.    Weight /BMI 04/15/2019 04/08/2019 04/02/2019  WEIGHT 323 lb 323 lb 323 lb  HEIGHT 5\' 4"  5\' 4"  5\' 4"   BMI 55.44 kg/m2 55.44 kg/m2 55.44 kg/m2      Encounter for pain management The patient's Controlled Substance registry is reviewed and compliance confirmed. Adequacy of  Pain control and level of function is assessed. Medication dosing is adjusted as deemed appropriate. Twelve weeks of medication is prescribed , patient signs for the script and is provided with a follow up appointment between 11 to 12 weeks .   Insomnia due to anxiety and fear Sleep hygiene reviewed and written information offered also. Prescription sent for  medication needed.

## 2019-04-28 ENCOUNTER — Telehealth: Payer: Self-pay | Admitting: Internal Medicine

## 2019-04-28 ENCOUNTER — Ambulatory Visit: Payer: Medicare Other | Admitting: Family Medicine

## 2019-04-28 NOTE — Telephone Encounter (Signed)
Pt updated with [facility] visitor's policy. Pt state she has since decided to come by herself and will relay message to her.

## 2019-04-28 NOTE — Telephone Encounter (Signed)
New Message    Pt is calling and says she will need her daughter to assist her to her appt because he daughter is the one who takes care of her    Please advise

## 2019-05-01 ENCOUNTER — Other Ambulatory Visit: Payer: Self-pay | Admitting: Family Medicine

## 2019-05-05 ENCOUNTER — Ambulatory Visit (INDEPENDENT_AMBULATORY_CARE_PROVIDER_SITE_OTHER): Payer: Medicare Other | Admitting: Internal Medicine

## 2019-05-05 ENCOUNTER — Other Ambulatory Visit: Payer: Self-pay

## 2019-05-05 ENCOUNTER — Encounter: Payer: Self-pay | Admitting: Internal Medicine

## 2019-05-05 VITALS — BP 127/83 | HR 80 | Temp 97.3°F | Ht 64.0 in | Wt 322.6 lb

## 2019-05-05 DIAGNOSIS — I10 Essential (primary) hypertension: Secondary | ICD-10-CM

## 2019-05-05 DIAGNOSIS — E1159 Type 2 diabetes mellitus with other circulatory complications: Secondary | ICD-10-CM

## 2019-05-05 DIAGNOSIS — R0609 Other forms of dyspnea: Secondary | ICD-10-CM

## 2019-05-05 DIAGNOSIS — I5032 Chronic diastolic (congestive) heart failure: Secondary | ICD-10-CM

## 2019-05-05 DIAGNOSIS — G8918 Other acute postprocedural pain: Secondary | ICD-10-CM

## 2019-05-05 DIAGNOSIS — R0600 Dyspnea, unspecified: Secondary | ICD-10-CM

## 2019-05-05 DIAGNOSIS — E7800 Pure hypercholesterolemia, unspecified: Secondary | ICD-10-CM

## 2019-05-05 MED ORDER — MAGNESIUM OXIDE 400 MG PO CAPS: ORAL_CAPSULE | ORAL | 3 refills | Status: DC

## 2019-05-05 MED ORDER — CLONIDINE 0.3 MG/24HR TD PTWK: MEDICATED_PATCH | TRANSDERMAL | 0 refills | Status: DC

## 2019-05-05 NOTE — Progress Notes (Signed)
Cardiology Office Note:    Date:  05/05/2019   ID:  Angela [name], DOB 1961-03-08, MRN [id number]  PCP:  Angela Helper, MD  Cardiologist:  Angela Munroe, MD  Electrophysiologist:  None   Referring MD: Angela Helper, MD   Chief Complaint: f/u DOE  History of Present Illness:    Angela [name] is a 59 y.o. female with a hx of HTN, HLD, and diabetes mellitus who presents today for follow-up of coughing and shortness of breath with orthopnea.  She was last seen in cardiology by my colleague Angela Domino, NP in September 2020.  At that time after transitioning from carvedilol to metoprolol she was feeling well and trying to follow aggressive lifestyle modification.  She presented to her primary care physician in December with concerns of increasing dyspnea on exertion, exercise intolerance, cough with wheezing, and increasing orthopnea, endorsing 6 pillow orthopnea with her primary care physician.  She also is endorsing wheezing.  She tells me that she is not able to take her diuretic daily because of cramping in her lower extremities which at times feels worse than the shortness of breath itself.  She has been prescribed triamterene HCTZ 75-50 mg but finds it difficult to take this daily.  She has been referred to pulmonary medicine by her primary care physician and that consult is upcoming.  She has a history of obstructive sleep apnea and is compliant with CPAP.  In addition she is endorsing pain at her right radial site 1 year after cath.  She tells me she has quite a bit of discomfort at the site both at the access point as well as distally in her hand.  She denies any pallor, or loss of sensation in the hand.  Her daughter Angela [name] has been assisting her at home and helping with her medications.  Past Medical History:  Diagnosis Date  . Anxiety   . Arthritis   . Asthma   . Coronary atherosclerosis of native coronary artery    Mild nonobstructive CAD by cardiac  catheterization 2008 - Dr. Terrence Dupont  . Degenerative disc disease   . Depression   . Dysrhythmia   . Essential hypertension, benign   . Folliculitis 11/10/4578  . GERD (gastroesophageal reflux disease)   . H/O hiatal hernia   . Helicobacter pylori gastritis 2008  . Hypercholesterolemia   . Hypertension   . Hyperthyroidism    s/p radiation  . Left-sided epistaxis 05/15/2018  . Low back pain   . Migraine   . Nephrolithiasis    Recurring episodes since 2004  . Severe obesity (BMI >= 40) (HCC)   . Sleep apnea    STOP BANG SCORE 6no cpap used  . Type 2 diabetes mellitus with diabetic neuropathy [facility])     Past Surgical History:  Procedure Laterality Date  . ABDOMINAL HYSTERECTOMY    . BACK SURGERY  1993  . BACK SURGERY  1994  . BACK SURGERY  2002  . BACK SURGERY  2011   [place] Left   . CHOLECYSTECTOMY  1996  . COLONOSCOPY  2000 [facility]   INT HEMORRHOIDS/FISSURE  . COLONOSCOPY  2003 [facility], CHANGE IN BOWEL HABITS   INT HEMORRHOIDS  . COLONOSCOPY  2006 [facility]   INT HEMORRHOIDS  . COLONOSCOPY  2007 [facility]   INT HEMORRHOIDS  . CYSTOSCOPY WITH STENT PLACEMENT Left 07/25/2012   Procedure: CYSTOSCOPY, left retograde pyelogram WITH left ureteral  STENT PLACEMENT;  Surgeon: Claybon Jabs,  MD;  Location: [facility];  Service: Urology;  Laterality: Left;  . CYSTOSCOPY/RETROGRADE/URETEROSCOPY  12/04/2011   Procedure: CYSTOSCOPY/RETROGRADE/URETEROSCOPY;  Surgeon: Molli Hazard, MD;  Location: [facility];  Service: Urology;  Laterality: Right;  . CYSTOSCOPY/RETROGRADE/URETEROSCOPY/STONE EXTRACTION WITH BASKET Left 09/03/2012   Procedure: CYSTOSCOPY/RETROGRADE pyelogram/digital URETEROSCOPY/STONE EXTRACTION WITH BASKET, left stent removal;  Surgeon: Molli Hazard, MD;  Location: [facility];  Service: Urology;  Laterality: Left;  . ESOPHAGOGASTRODUODENOSCOPY  2008   QPR:FFMBW hiatal hernia./Normal esophagus without evidence of Barrett's mass, stricture, erosion or  ulceration./Normal duodenal bulb and second portion of the duodenum./Diffuse erythema in the body and the antrum with occasional erosion.  Biopsies obtained via cold forceps to evaluate for H. pylori gastritis  . FRACTURE SURGERY  1999   right clavicle  . HOLMIUM LASER APPLICATION Left 08/04/6597   Procedure: HOLMIUM LASER APPLICATION;  Surgeon: Molli Hazard, MD;  Location: [facility];  Service: Urology;  Laterality: Left;  . KNEE ARTHROSCOPY  10/04/2010,    right knee arthroscopy, dr Theda Sers  . LEFT HEART CATH AND CORONARY ANGIOGRAPHY N/A 05/08/2018   Procedure: LEFT HEART CATH AND CORONARY ANGIOGRAPHY;  Surgeon: Troy Sine, MD;  Location: [facility];  Service: Cardiovascular;  Laterality: N/A;  . left knee arthroscopic surgery  1999  . Left salphingectomy secondary to ectopic pregnancy  1991  . PARTIAL HYSTERECTOMY  1991  . rotary cuff  Right 05/14/2014   [facility]  . UPPER GASTROINTESTINAL ENDOSCOPY  2008 abd pain   H. pylori gastritis  . UPPER GASTROINTESTINAL ENDOSCOPY  1996 [facility] [place]   PUD    Current Medications: Current Meds  Medication Sig  . albuterol (PROAIR HFA) 108 (90 Base) MCG/ACT inhaler INHALE 2 PUFFS BY MOUTH EVERY 6-8HRS AS NEEDED (Patient taking differently: Inhale 2 puffs into the lungs every 6 (six) hours as needed for wheezing or shortness of breath. )  . amLODipine (NORVASC) 10 MG tablet Take 1 tablet (10 mg total) by mouth daily.  . blood glucose meter kit and supplies Dispense based on patient and insurance preference. Once daily testing dx. e11.9  . calcipotriene (DOVONOX) 0.005 % ointment APPLY TO AFFECTED AREA OF LEGS AND ELBOWS DAILY  . carvedilol (COREG) 12.5 MG tablet TAKE 1 TABLET (12.5 MG TOTAL) BY MOUTH 2 (TWO) TIMES DAILY.  . cloNIDine (CATAPRES - DOSED IN MG/24 HR) 0.3 mg/24hr patch APPLY 1 PATCH TO SKIN ONCE A WEEK  . cloNIDine (CATAPRES) 0.3 MG tablet TAKE 1 TABLET (0.3 MG TOTAL) BY MOUTH AT BEDTIME.  . diazepam (VALIUM) 5 MG tablet  Take one tablet by mouth three times daily for back spasm  . DULoxetine (CYMBALTA) 60 MG capsule TAKE 1 CAPSULE BY MOUTH TWICE A DAY (Patient taking differently: Take 60 mg by mouth 2 (two) times daily. )  . ergocalciferol (VITAMIN D2) 50000 units capsule Take 1 capsule (50,000 Units total) by mouth once a week. One capsule once weekly  . esomeprazole (NEXIUM) 40 MG capsule TAKE 1 CAPSULE BY MOUTH EVERY DAY  . fluconazole (DIFLUCAN) 150 MG tablet Take one tablet by mouth once daily, as needed, for vaginal itch associated with whig dose prednisone and diabetes  . fluticasone (FLONASE) 50 MCG/ACT nasal spray Place 2 sprays into both nostrils daily. (Patient taking differently: Place 2 sprays into both nostrils daily as needed for allergies. )  . gabapentin (NEURONTIN) 800 MG tablet TAKE 1 TABLET BY MOUTH FOUR TIMES A DAY  . glipiZIDE (GLUCOTROL) 10 MG tablet Take 10 mg by  mouth daily before breakfast.  . hydrALAZINE (APRESOLINE) 25 MG tablet Take by mouth.  . hydrOXYzine (ATARAX/VISTARIL) 10 MG tablet Take 10-30 mg by mouth at bedtime.   . Insulin Glargine (LANTUS SOLOSTAR) 100 UNIT/ML Solostar Pen Inject 80 Units into the skin at bedtime.  . Insulin Pen Needle (B-D ULTRAFINE III SHORT PEN) 31G X 8 MM MISC 1 each by Does not apply route as directed.  Marland Kitchen ipratropium-albuterol (DUONEB) 0.5-2.5 (3) MG/3ML SOLN Take 3 mLs by nebulization every 6 (six) hours as needed. (Patient taking differently: Take 3 mLs by nebulization every 6 (six) hours as needed (SOB). )  . ipratropium-albuterol (DUONEB) 0.5-2.5 (3) MG/3ML SOLN Take 3 mLs by nebulization every 6 (six) hours as needed.  Marland Kitchen KLOR-CON M20 20 MEQ tablet TAKE 1 TABLET BY MOUTH TWICE A DAY  . Lancets (ONETOUCH DELICA PLUS [id number]) MISC TEST ONCE DAILY  . levothyroxine (SYNTHROID) 50 MCG tablet TAKE 1 TABLET BY MOUTH DAILY BEFORE BREAKFAST  . loratadine (CLARITIN) 10 MG tablet Take 1 tablet (10 mg total) by mouth daily.  . meclizine (ANTIVERT) 25 MG  tablet Take 25 mg by mouth 3 (three) times daily as needed for dizziness.  . metFORMIN (GLUCOPHAGE) 1000 MG tablet Take 1 tablet (1,000 mg total) by mouth 2 (two) times daily.  . montelukast (SINGULAIR) 10 MG tablet TAKE 1 TABLET (10 MG TOTAL) BY MOUTH AT BEDTIME.  . nitroGLYCERIN (NITROSTAT) 0.4 MG SL tablet Place 1 tablet (0.4 mg total) under the tongue every 5 (five) minutes as needed for chest pain.  Marland Kitchen ondansetron (ZOFRAN) 4 MG tablet Take 1 tablet (4 mg total) by mouth every 8 (eight) hours as needed for nausea or vomiting.  . [EXPIRED] oxyCODONE (OXYCONTIN) 40 mg 12 hr tablet Take one tablet by mouth two times daily for back pain  . oxyCODONE (OXYCONTIN) 40 mg 12 hr tablet Take 1 tablet (40 mg total) by mouth every 12 (twelve) hours.  Derrill Memo ON 06/10/2019] oxyCODONE (OXYCONTIN) 40 mg 12 hr tablet Take 1 tablet (40 mg total) by mouth every 12 (twelve) hours.  . predniSONE (STERAPRED UNI-PAK 21 TAB) 5 MG (21) TBPK tablet Take 1 tablet (5 mg total) by mouth as directed. Use as directed  . rosuvastatin (CRESTOR) 40 MG tablet TAKE 1 TABLET BY MOUTH EVERY DAY  . SYMBICORT 160-4.5 MCG/ACT inhaler TAKE 2 PUFFS BY MOUTH TWICE A DAY  . triamterene-hydrochlorothiazide (MAXZIDE) 75-50 MG tablet Take 1 tablet by mouth daily.  . [DISCONTINUED] cyclobenzaprine (FLEXERIL) 10 MG tablet TAKE 1 TABLET BY MOUTH THREE TIMES A DAY  . [DISCONTINUED] glucose blood test strip Use as instructed four times daily testing dx e11.65     Allergies:   Morphine, Ace inhibitors, Aspirin, Losartan, Tapentadol, Tomato, and Adhesive [tape]   Social History   Socioeconomic History  . Marital status: Single    Spouse name: Not on file  . Number of children: 1  . Years of education: 71  . Highest education level: 12th grade  Occupational History  . Occupation: Disable   Tobacco Use  . Smoking status: Never Smoker  . Smokeless tobacco: Never Used  Substance and Sexual Activity  . Alcohol use: No  . Drug use: No  .  Sexual activity: Not Currently    Partners: Male    Birth control/protection: Surgical    Comment: hyst  Other Topics Concern  . Not on file  Social History Narrative   Lives alone    Social Determinants of Health   Financial  Resource Strain:   . Difficulty of Paying Living Expenses: Not on file  Food Insecurity:   . Worried About Charity fundraiser in the Last Year: Not on file  . Ran Out of Food in the Last Year: Not on file  Transportation Needs:   . Lack of Transportation (Medical): Not on file  . Lack of Transportation (Non-Medical): Not on file  Physical Activity:   . Days of Exercise per Week: Not on file  . Minutes of Exercise per Session: Not on file  Stress:   . Feeling of Stress : Not on file  Social Connections:   . Frequency of Communication with Friends and Family: Not on file  . Frequency of Social Gatherings with Friends and Family: Not on file  . Attends Religious Services: Not on file  . Active Member of Clubs or Organizations: Not on file  . Attends Archivist Meetings: Not on file  . Marital Status: Not on file     Family History: The patient's family history includes Asthma in her mother; Cancer in her father and mother; Colon cancer in her father; Heart attack in her father; Heart disease in her mother; Hypertension in her mother. There is no history of Colon polyps.  ROS:   Please see the history of present illness.    All other systems reviewed and are negative.  EKGs/Labs/Other Studies Reviewed:    The following studies were reviewed today:  EKG:  Not performed today. ECG 01/23/2019 - NSR, rate 71, possible LAE  Recent Labs: 12/19/2018: ALT 28; BUN 14; Creat 0.96; Potassium 4.1; Sodium 139  Recent Lipid Panel    Component Value Date/Time   CHOL 90 08/28/2018 0903   TRIG 179 (H) 08/28/2018 0903   HDL 31 (L) 08/28/2018 0903   CHOLHDL 2.9 08/28/2018 0903   VLDL 16 05/06/2018 0335   LDLCALC 33 08/28/2018 0903    Physical Exam:     VS:  BP 127/83   Pulse 80   Temp (!) 97.3 F (36.3 C)   Ht '5\' 4"'$  (1.626 m)   Wt (!) 322 lb 9.6 oz (146.3 kg)   SpO2 93%   BMI 55.37 kg/m     Wt Readings from Last 5 Encounters:  05/08/19 (!) 323 lb (146.5 kg)  05/05/19 (!) 322 lb 9.6 oz (146.3 kg)  04/15/19 (!) 323 lb (146.5 kg)  04/08/19 (!) 323 lb (146.5 kg)  04/02/19 (!) 323 lb (146.5 kg)     Constitutional: No acute distress Eyes: sclera non-icteric, normal conjunctiva and lids ENMT: normal dentition, moist mucous membranes Cardiovascular: regular rhythm, normal rate, no murmurs. S1 and S2 normal. Radial pulses normal bilaterally. No jugular venous distention.  Respiratory: Bilateral scattered end expiratory wheezing GI : normal bowel sounds, soft and nontender. No distention.   MSK: extremities warm, well perfused.  Bilateral trace edema.  NEURO: grossly nonfocal exam, moves all extremities. PSYCH: alert and oriented x 3, normal mood and affect.  ASSESSMENT:    1. Chronic diastolic heart failure ([place])   2. Essential hypertension   3. Postprocedural pain of extremity following cardiac catheterization   4. Hypercholesterolemia   5. Type 2 diabetes mellitus with vascular disease ([place])   6. Dyspnea on exertion    PLAN:    Chronic diastolic heart failure-she feels as if she is accumulating fluid, but has difficulty taking her diuretic medicine on a daily basis.  Prior to initiating any additional diuretic therapy, it would be best if she can  take her triamterene HCTZ scheduled.  She endorses significant leg cramping with diuresis, therefore we will not muddy the picture by adding additional therapy.  I have encouraged her to take her diuretic daily and will prescribe magnesium supplementation to accompany this to help avoid leg swelling.  She is able to take this medication daily we can discuss transition to more potent diuresis.  Right wrist pain at cardiac cath access site-possibility of scar tissue at this site  contributing to nerve compression and possible discomfort she is experiencing.  Will obtain an ultrasound of the right wrist to ensure there are no compressive lesions such as cysts or scar tissue that may be compressing the area and contributing to pain.  Hypertension-stable on current regimen of amlodipine 10 mg daily, clonidine patch and clonidine tab, metoprolol tartrate 25 mg twice daily, and triamterene HCTZ 75-50 mg intermittently.  Hyperlipidemia-continues on Crestor 40 mg daily, LDL is below goal.  Total time of encounter: 30 minutes total time of encounter, including 20 minutes spent in face-to-face patient care. This time includes coordination of care and counseling regarding management of diastolic heart failure and right wrist pain. Remainder of non-face-to-face time involved reviewing chart documents/testing relevant to the patient encounter and documentation in the medical record.  Cherlynn Kaiser, MD [place]  [facility]    Medication Adjustments/Labs and Tests Ordered: Current medicines are reviewed at length with the patient today.  Concerns regarding medicines are outlined above.  Orders Placed This Encounter  Procedures  . US SOFT TISSUE RT UPPER EXTREMITY LTD (NON-VASCULAR)  . EKG 12-Lead   Meds ordered this encounter  Medications  . Magnesium Oxide 400 MG CAPS    Sig: Take one    Dispense:  90 capsule    Refill:  3    Patient Instructions  Medication Instructions:  Your physician has recommended you make the following change in your medication:   TAKE YOUR triamterene-hydrochlorothiazide (MAXZIDE) 75-50 MG tablet ON A DAILY BASIS  START MAGNESIUM OXIDE 400 MG. TAKE ONE CAPSULE BY MOUTH TWICE A DAY FOR 3 DAYS. AFTER THIS TAKE ONLY ONE CAPSULE DAILY.  *If you need a refill on your cardiac medications before your next appointment, please call your pharmacy*  Lab Work: NONE If you have labs (blood work) drawn today and your tests are completely  normal, you will receive your results only by: Marland Kitchen MyChart Message (if you have MyChart) OR . A paper copy in the mail If you have any lab test that is abnormal or we need to change your treatment, we will call you to review the results.  Testing/Procedures: Your physician has requested that you have an upper extremity ultrasound of your right wrist and hand.   Follow-Up: At [facility], you and your health needs are our priority.  As part of our continuing mission to provide you with exceptional heart care, we have created designated Provider Care Teams.  These Care Teams include your primary Cardiologist (physician) and Advanced Practice Providers (APPs -  Physician Assistants and Nurse Practitioners) who all work together to provide you with the care you need, when you need it.  Your next appointment:   LAST WEEK OF February 06/24/2019-06/27/2019 (EXCLUDING 06/23/2019). TO BE SCHEDULED  The format for your next appointment:   In Person  Provider:   Cherlynn Kaiser, MD

## 2019-05-05 NOTE — Patient Instructions (Addendum)
Medication Instructions:  Your physician has recommended you make the following change in your medication:   TAKE YOUR triamterene-hydrochlorothiazide (MAXZIDE) 75-50 MG tablet ON A DAILY BASIS  START MAGNESIUM OXIDE 400 MG. TAKE ONE CAPSULE BY MOUTH TWICE A DAY FOR 3 DAYS. AFTER THIS TAKE ONLY ONE CAPSULE DAILY.  *If you need a refill on your cardiac medications before your next appointment, please call your pharmacy*  Lab Work: NONE If you have labs (blood work) drawn today and your tests are completely normal, you will receive your results only by: Marland Kitchen MyChart Message (if you have MyChart) OR . A paper copy in the mail If you have any lab test that is abnormal or we need to change your treatment, we will call you to review the results.  Testing/Procedures: Your physician has requested that you have an upper extremity ultrasound of your right wrist and hand.   Follow-Up: At [facility], you and your health needs are our priority.  As part of our continuing mission to provide you with exceptional heart care, we have created designated Provider Care Teams.  These Care Teams include your primary Cardiologist (physician) and Advanced Practice Providers (APPs -  Physician Assistants and Nurse Practitioners) who all work together to provide you with the care you need, when you need it.  Your next appointment:   LAST WEEK OF February 06/24/2019-06/27/2019 (EXCLUDING 06/23/2019). TO BE SCHEDULED  The format for your next appointment:   In Person  Provider:   Cherlynn Kaiser, MD

## 2019-05-06 ENCOUNTER — Other Ambulatory Visit: Payer: Self-pay | Admitting: Family Medicine

## 2019-05-06 ENCOUNTER — Telehealth: Payer: Self-pay | Admitting: Internal Medicine

## 2019-05-06 DIAGNOSIS — M62830 Muscle spasm of back: Secondary | ICD-10-CM

## 2019-05-06 NOTE — Telephone Encounter (Signed)
Significant discomfort in at radial access site and pain in hand - question compressive lesion, scar tissue at radial site 1 year after cath.

## 2019-05-06 NOTE — Telephone Encounter (Signed)
Radiology wanted to know what the   Ultrasounds for to see if hematoma is present or not ?Marland Kitchen They had a question why the ultrasound was  Ordered.  RN reviewed office note from 05/05/19 - patient having pain right wrist and hand  -patient contributing to  Cardiac cath 05/08/18 a year ago.  staff member states she will relay the message .

## 2019-05-06 NOTE — Telephone Encounter (Signed)
New Message  Received a phone call from [facility] and they wanted to talk with someone regarding the patient. She is a patient of Dr. Margaretann Loveless  Please call to advise.

## 2019-05-08 ENCOUNTER — Other Ambulatory Visit: Payer: Self-pay

## 2019-05-08 ENCOUNTER — Ambulatory Visit (INDEPENDENT_AMBULATORY_CARE_PROVIDER_SITE_OTHER): Payer: Medicare Other | Admitting: Pulmonary Disease

## 2019-05-08 ENCOUNTER — Encounter: Payer: Self-pay | Admitting: Pulmonary Disease

## 2019-05-08 DIAGNOSIS — I5032 Chronic diastolic (congestive) heart failure: Secondary | ICD-10-CM | POA: Diagnosis not present

## 2019-05-08 DIAGNOSIS — J4531 Mild persistent asthma with (acute) exacerbation: Secondary | ICD-10-CM

## 2019-05-08 MED ORDER — ALBUTEROL SULFATE HFA 108 (90 BASE) MCG/ACT IN AERS: 2.0000 | INHALATION_SPRAY | Freq: Four times a day (QID) | RESPIRATORY_TRACT | 6 refills | Status: DC | PRN

## 2019-05-08 NOTE — Patient Instructions (Signed)
Take Maxzide daily as directed by cardiologist. We will aim for a 5 pound weight loss and see if your breathing gets better.  Take Symbicort-2 puffs twice daily. Prescription for albuterol MDI 2 puffs every 6 hours as needed for rescue or wheezing

## 2019-05-08 NOTE — Assessment & Plan Note (Signed)
Take Maxzide daily as directed by cardiologist. We will aim for a 5 pound weight loss and see if your breathing gets better.

## 2019-05-08 NOTE — Progress Notes (Signed)
Subjective:    [name], female    DOB: 1961/02/28, 59 y.o.   MRN: [id number]  HPI  Chief Complaint  Patient presents with  . Consult    Patient is here for shortness of breath with exertion, wheezing and cough. Patient had a heart cath last year and has been having issues since then. Patient has to sleep elevated. Was diagnosed with CHF last month. Patient has CPAP    59 year old never smoker presents for evaluation of persistent shortness of breath and wheezing for the past few months. She reports onset of intermittent wheezing and a dry cough since her cardiac cath in 05/2018.  She had difficult to control hypertension but this is now better controlled on carvedilol, amlodipine and clonidine.  She has chronic pain for which she takes oxycodone.  She has OSA and is compliant with CPAP 10 cm with full facemask for the past 3 years, followed by neurology  Asthma was diagnosed about 7 to 8 years ago, in the past evaluation in our office spirometry has been normal in 2015.  She has been given Symbicort by her PCP which she is now taking twice daily.  She has run out of her rescue inhaler.  She denies classic asthma exacerbations.  She worked for 5 years in the [place] She develops worsening wheezing over the past month, chest x-ray 12/8 was personally reviewed which did not show any infiltrates or effusions, mild bibasilar atelectasis. She was given a small course of prednisone.  She then saw her cardiologist who advised her to decrease her fluid intake and increase her Maxide to full tablet daily.  She developed cramping of her legs and started taking her potassium pills.  Weight is unchanged over the past month at 322 pounds  She still complains of wheezing on walking short distances which seems to decrease on stopping and resting.  Reports evening pedal edema No fevers or sick contacts   Significant tests/ events reviewed  Cardiac cath 05/2018 no significant CAD, LVEDP  14  Echo [id number] grade 2 diastolic dysfunction, normal LVEF  2015 normal spirometry 01/2016 split-night study-AHI 22/hour, CPAP 10 cm    Past Medical History:  Diagnosis Date  . Anxiety   . Arthritis   . Asthma   . Coronary atherosclerosis of native coronary artery    Mild nonobstructive CAD by cardiac catheterization 2008 - Dr. Terrence Dupont  . Degenerative disc disease   . Depression   . Dysrhythmia   . Essential hypertension, benign   . Folliculitis 0000000  . GERD (gastroesophageal reflux disease)   . H/O hiatal hernia   . Helicobacter pylori gastritis 2008  . Hypercholesterolemia   . Hypertension   . Hyperthyroidism    s/p radiation  . Left-sided epistaxis 05/15/2018  . Low back pain   . Migraine   . Nephrolithiasis    Recurring episodes since 2004  . Severe obesity (BMI >= 40) (HCC)   . Sleep apnea    STOP BANG SCORE 6no cpap used  . Type 2 diabetes mellitus with diabetic neuropathy [facility])     Past Surgical History:  Procedure Laterality Date  . ABDOMINAL HYSTERECTOMY    . BACK SURGERY  1993  . BACK SURGERY  1994  . BACK SURGERY  2002  . BACK SURGERY  2011   [place] Left   . CHOLECYSTECTOMY  1996  . COLONOSCOPY  2000 [facility]   INT HEMORRHOIDS/FISSURE  . COLONOSCOPY  2003 [facility],  CHANGE IN BOWEL HABITS   INT HEMORRHOIDS  . COLONOSCOPY  2006 [facility]   INT HEMORRHOIDS  . COLONOSCOPY  2007 [facility]   INT HEMORRHOIDS  . CYSTOSCOPY WITH STENT PLACEMENT Left 07/25/2012   Procedure: CYSTOSCOPY, left retograde pyelogram WITH left ureteral  STENT PLACEMENT;  Surgeon: Claybon Jabs, MD;  Location: [facility];  Service: Urology;  Laterality: Left;  . CYSTOSCOPY/RETROGRADE/URETEROSCOPY  12/04/2011   Procedure: CYSTOSCOPY/RETROGRADE/URETEROSCOPY;  Surgeon: Molli Hazard, MD;  Location: [facility];  Service: Urology;  Laterality: Right;  . CYSTOSCOPY/RETROGRADE/URETEROSCOPY/STONE EXTRACTION WITH BASKET Left 09/03/2012   Procedure: CYSTOSCOPY/RETROGRADE  pyelogram/digital URETEROSCOPY/STONE EXTRACTION WITH BASKET, left stent removal;  Surgeon: Molli Hazard, MD;  Location: [facility];  Service: Urology;  Laterality: Left;  . ESOPHAGOGASTRODUODENOSCOPY  2008   [id number] hiatal hernia./Normal esophagus without evidence of Barrett's mass, stricture, erosion or ulceration./Normal duodenal bulb and second portion of the duodenum./Diffuse erythema in the body and the antrum with occasional erosion.  Biopsies obtained via cold forceps to evaluate for H. pylori gastritis  . FRACTURE SURGERY  1999   right clavicle  . HOLMIUM LASER APPLICATION Left [id number]   Procedure: HOLMIUM LASER APPLICATION;  Surgeon: Molli Hazard, MD;  Location: [facility];  Service: Urology;  Laterality: Left;  . KNEE ARTHROSCOPY  10/04/2010,    right knee arthroscopy, dr Theda Sers  . LEFT HEART CATH AND CORONARY ANGIOGRAPHY N/A 05/08/2018   Procedure: LEFT HEART CATH AND CORONARY ANGIOGRAPHY;  Surgeon: Troy Sine, MD;  Location: [facility];  Service: Cardiovascular;  Laterality: N/A;  . left knee arthroscopic surgery  1999  . Left salphingectomy secondary to ectopic pregnancy  1991  . PARTIAL HYSTERECTOMY  1991  . rotary cuff  Right 05/14/2014   [facility]  . UPPER GASTROINTESTINAL ENDOSCOPY  2008 abd pain   H. pylori gastritis  . UPPER GASTROINTESTINAL ENDOSCOPY  1996 [place] [place]   PUD    Allergies  Allergen Reactions  . Morphine Other (See Comments)    Reaction with esophagus, unable to swallow.   . Ace Inhibitors Cough  . Aspirin Other (See Comments)    Reaction with esophagus, unable to swallow.   . Losartan Cough  . Tapentadol Other (See Comments)    Nausea, increased sleepiness, h/a  . Tomato Other (See Comments)    Acid reflux due to acid in tomato  . Adhesive [Tape] Rash    Social History   Socioeconomic History  . Marital status: Single    Spouse name: Not on file  . Number of children: 1  . Years of education: 69  . Highest education  level: 12th grade  Occupational History  . Occupation: Disable   Tobacco Use  . Smoking status: Never Smoker  . Smokeless tobacco: Never Used  Substance and Sexual Activity  . Alcohol use: No  . Drug use: No  . Sexual activity: Not Currently    Partners: Male    Birth control/protection: Surgical    Comment: hyst  Other Topics Concern  . Not on file  Social History Narrative   Lives alone    Social Determinants of Health   Financial Resource Strain:   . Difficulty of Paying Living Expenses: Not on file  Food Insecurity:   . Worried About Charity fundraiser in the Last Year: Not on file  . Ran Out of Food in the Last Year: Not on file  Transportation Needs:   . Lack of Transportation (Medical): Not on file  .  Lack of Transportation (Non-Medical): Not on file  Physical Activity:   . Days of Exercise per Week: Not on file  . Minutes of Exercise per Session: Not on file  Stress:   . Feeling of Stress : Not on file  Social Connections:   . Frequency of Communication with Friends and Family: Not on file  . Frequency of Social Gatherings with Friends and Family: Not on file  . Attends Religious Services: Not on file  . Active Member of Clubs or Organizations: Not on file  . Attends Archivist Meetings: Not on file  . Marital Status: Not on file  Intimate Partner Violence:   . Fear of Current or Ex-Partner: Not on file  . Emotionally Abused: Not on file  . Physically Abused: Not on file  . Sexually Abused: Not on file   ' Family History  Problem Relation Age of Onset  . Heart disease Mother   . Hypertension Mother   . Cancer Mother        Cervical   . Asthma Mother   . Heart attack Father   . Cancer Father        Prostate  . Colon cancer Father        DECEASED AGE 52  . Colon polyps Neg Hx      Review of Systems Constitutional: negative for anorexia, fevers and sweats  Eyes: negative for irritation, redness and visual disturbance  Ears, nose,  mouth, throat, and face: negative for earaches, epistaxis, nasal congestion and sore throat  Respiratory: negative for cough,  sputum Cardiovascular: negative for chest pain,  orthopnea, palpitations and syncope  Gastrointestinal: negative for abdominal pain, constipation, diarrhea, melena, nausea and vomiting  Genitourinary:negative for dysuria, frequency and hematuria  Hematologic/lymphatic: negative for bleeding, easy bruising and lymphadenopathy  Musculoskeletal:negative for arthralgias, muscle weakness and stiff joints  Neurological: negative for coordination problems, gait problems, headaches and weakness  Endocrine: negative for diabetic symptoms including polydipsia, polyuria and weight loss     Objective:   Physical Exam  Gen. Pleasant, obese, in no distress, normal affect ENT - no pallor,icterus, no post nasal drip, class 2-3 airway Neck: No JVD, no thyromegaly, no carotid bruits Lungs: no use of accessory muscles, no dullness to percussion, decreased without rales or rhonchi  Cardiovascular: Rhythm regular, heart sounds  normal, no murmurs or gallops, 1+ peripheral edema Abdomen: soft and non-tender, no hepatosplenomegaly, BS normal. Musculoskeletal: No deformities, no cyanosis or clubbing Neuro:  alert, non focal, no tremors       Assessment & Plan:

## 2019-05-08 NOTE — Assessment & Plan Note (Addendum)
Because of her worsening shortness of breath and wheezing seems to be more likely related to chronic diastolic heart failure rather than asthma exacerbation. However since she is very symptomatic, we will treat for both etiologies Chest x-ray personally reviewed which only shows mild bibasilar atelectasis and some vascular congestion-no evidence of ILD  Wheezing has been noted in the past with normal spirometry  Take Symbicort-2 puffs twice daily. Prescription for albuterol MDI 2 puffs every 6 hours as needed for rescue or wheezing  If persistent within 4 weeks, check BNP and D-dimer

## 2019-05-13 ENCOUNTER — Ambulatory Visit
Admission: RE | Admit: 2019-05-13 | Discharge: 2019-05-13 | Disposition: A | Discharge status: Home / Self Care | Payer: Medicare Other | Source: Ambulatory Visit | Attending: Internal Medicine | Admitting: Internal Medicine

## 2019-05-13 DIAGNOSIS — G8918 Other acute postprocedural pain: Secondary | ICD-10-CM

## 2019-05-13 DIAGNOSIS — M25531 Pain in right wrist: Secondary | ICD-10-CM | POA: Diagnosis not present

## 2019-05-14 ENCOUNTER — Telehealth: Payer: Self-pay | Admitting: *Deleted

## 2019-05-14 NOTE — Telephone Encounter (Signed)
-----   Message from Elouise Munroe, MD sent at 05/14/2019 11:23 AM EST ----- No worrisome findings at the site of your previous heart cath access on the right wrist. I would recommend you review your hand and wrist pain with your primary doctor.

## 2019-05-14 NOTE — Telephone Encounter (Signed)
Released to mychart. , left message on patient's voicemail per dpr with detail  Results, any question may call back

## 2019-05-15 DIAGNOSIS — G4733 Obstructive sleep apnea (adult) (pediatric): Secondary | ICD-10-CM | POA: Diagnosis not present

## 2019-05-15 NOTE — Telephone Encounter (Signed)
Follow Up:    Pt is calling back from yesterday, concerning her results.

## 2019-05-16 ENCOUNTER — Ambulatory Visit: Payer: Medicare Other | Admitting: Orthotics

## 2019-05-16 NOTE — Telephone Encounter (Signed)
Spoke with  Patient result given

## 2019-05-20 ENCOUNTER — Other Ambulatory Visit: Payer: Self-pay | Admitting: "Endocrinology

## 2019-05-23 ENCOUNTER — Ambulatory Visit: Payer: Medicare Other | Admitting: Orthotics

## 2019-05-29 ENCOUNTER — Other Ambulatory Visit: Payer: Self-pay

## 2019-05-29 ENCOUNTER — Encounter: Payer: Self-pay | Admitting: Nutrition

## 2019-05-29 ENCOUNTER — Encounter: Payer: Medicare Other | Attending: Family Medicine | Admitting: Nutrition

## 2019-05-29 DIAGNOSIS — I10 Essential (primary) hypertension: Secondary | ICD-10-CM

## 2019-05-29 DIAGNOSIS — E1165 Type 2 diabetes mellitus with hyperglycemia: Secondary | ICD-10-CM | POA: Insufficient documentation

## 2019-05-29 DIAGNOSIS — E782 Mixed hyperlipidemia: Secondary | ICD-10-CM | POA: Diagnosis not present

## 2019-05-29 DIAGNOSIS — Z6841 Body Mass Index (BMI) 40.0 and over, adult: Secondary | ICD-10-CM | POA: Insufficient documentation

## 2019-05-29 DIAGNOSIS — IMO0002 Reserved for concepts with insufficient information to code with codable children: Secondary | ICD-10-CM

## 2019-05-29 DIAGNOSIS — E118 Type 2 diabetes mellitus with unspecified complications: Secondary | ICD-10-CM | POA: Diagnosis not present

## 2019-05-29 NOTE — Progress Notes (Signed)
Telelphone visit Medical Nutrition Therapy:  Appt start time: 3220 end time: 2542 Follow up Assessment:  Primary concerns today:  [place] Obesity and Dm Type 2. 80 units lantus and metformin 1000 mg BID  and Glipizide  Her daughter is her caretaker  fbs 130-160  Bedtime less than 180 mg/dl. Not able to cook meals for herself. She has been in CHF and now on a stick low salt diet.  Has leg cramps and  Has been started on Magnesium oxide. Sees Dr. Dorris Fetch, Endocrinology.  Goals previous set:   1. Eat by 9 am--Done 2. Walk 15 minutes a day- walking in house. 3. Increase vegetables with lunch and dinner-doing very well. 4. Keep drinking water only and avoid diet sodas- dpne. Lose 1 lb per week-met lost 15 lbs in the last month. Get A1C to 7%-- work in progress.. Lab Results  Component Value Date   HGBA1C 13.0 (A) 04/02/2019     Wt Readings from Last 3 Encounters:  05/08/19 (!) 323 lb (146.5 kg)  05/05/19 (!) 322 lb 9.6 oz (146.3 kg)  04/15/19 (!) 323 lb (146.5 kg)   Ht Readings from Last 3 Encounters:  05/08/19 '5\' 4"'$  (1.626 m)  05/05/19 '5\' 4"'$  (1.626 m)  04/15/19 '5\' 4"'$  (1.626 m)   There is no height or weight on file to calculate BMI. '@BMIFA'$ @ Facility age limit for growth percentiles is 20 years. Facility age limit for growth percentiles is 20 years.  Lab Results  Component Value Date   HGBA1C 13.0 (A) 04/02/2019   CMP Latest Ref Rng & Units 12/19/2018 08/28/2018 05/22/2018  Glucose 65 - 139 mg/dL 228(H) 393(H) 152(H)  BUN 7 - 25 mg/dL '14 17 24  '$ Creatinine 0.50 - 1.05 mg/dL 0.96 0.90 1.17(H)  Sodium 135 - 146 mmol/L 139 137 137  Potassium 3.5 - 5.3 mmol/L 4.1 4.6 4.5  Chloride 98 - 110 mmol/L 101 98 101  CO2 20 - 32 mmol/L '29 29 27  '$ Calcium 8.6 - 10.4 mg/dL 9.8 9.6 9.6  Total Protein 6.1 - 8.1 g/dL 7.3 7.6 -  Total Bilirubin 0.2 - 1.2 mg/dL 0.4 0.5 -  Alkaline Phos 38 - 126 U/L - - -  AST 10 - 35 U/L 24 37(H) -  ALT 6 - 29 U/L 28 51(H) -   Lipid Panel     Component  Value Date/Time   CHOL 90 08/28/2018 0903   TRIG 179 (H) 08/28/2018 0903   HDL 31 (L) 08/28/2018 0903   CHOLHDL 2.9 08/28/2018 0903   VLDL 16 05/06/2018 0335   LDLCALC 33 08/28/2018 0903    Preferred Learning Style:   No preference indicated   Learning Readiness:   Ready  Change in progress   MEDICATIONS:    DIETARY INTAKE:  24-hr recall:  B ( AM): 1 egg, 2 pear   Snk ( AM): L ( PM): salad nuts, cottage cheese, Snk ( PM)  D ( PM):   Chicken, baked potato and salad, water  ,Snk ( PM):  Beverages: water   Usual physical activity: ADL  Estimated energy needs: 1200  calories 135 g carbohydrates 90 g protein 33 g fat  Progress Towards Goal(s):  In progress.   Nutritional Diagnosis:  NB-1.1 Food and nutrition-related knowledge deficit As related to Diabetes.  As evidenced by A1C >14%    Intervention:  Nutrition and Diabetes education provided on My Plate, CHO counting, meal planning, portion sizes, timing of meals, avoiding snacks between meals unless having a low blood  sugar, target ranges for A1C and blood sugars, signs/symptoms and treatment of hyper/hypoglycemia, monitoring blood sugars, taking medications as prescribed, benefits of exercising 30 minutes per day and prevention of complications of DM.   Goal Keep up great job!  Keep working eating more fresh fruits and vegetables. Keep drinking water within allowance Exercise and walk when you can in the house. Get A1C to 7% or less.   Teaching Method Utilized:  Visual Auditory Hands on  Handouts given during visit include:  The Plate Method   Meal Plan Card   Barriers to learning/adherence to lifestyle change: none  Demonstrated degree of understanding via:  Teach Back   Monitoring/Evaluation:  Dietary intake, exercise, meal planning, and body weight in 3-4 months.Marland Kitchen Marland Kitchen

## 2019-05-29 NOTE — Patient Instructions (Signed)
  Goal Keep up great job!  Keep working eating more fresh fruits and vegetables. Keep drinking water within allowance Exercise and walk when you can in the house. Get A1C to 7% or less.

## 2019-06-01 ENCOUNTER — Other Ambulatory Visit: Payer: Self-pay | Admitting: "Endocrinology

## 2019-06-02 ENCOUNTER — Other Ambulatory Visit: Payer: Self-pay | Admitting: Family Medicine

## 2019-06-04 ENCOUNTER — Other Ambulatory Visit: Payer: Self-pay | Admitting: Family Medicine

## 2019-06-05 ENCOUNTER — Encounter: Payer: Self-pay | Admitting: Pulmonary Disease

## 2019-06-05 ENCOUNTER — Ambulatory Visit (INDEPENDENT_AMBULATORY_CARE_PROVIDER_SITE_OTHER): Payer: Medicare Other | Admitting: Pulmonary Disease

## 2019-06-05 ENCOUNTER — Other Ambulatory Visit: Payer: Self-pay

## 2019-06-05 VITALS — BP 126/72 | HR 79 | Temp 98.4°F | Ht 64.0 in | Wt 330.0 lb

## 2019-06-05 DIAGNOSIS — G4733 Obstructive sleep apnea (adult) (pediatric): Secondary | ICD-10-CM

## 2019-06-05 DIAGNOSIS — J4530 Mild persistent asthma, uncomplicated: Secondary | ICD-10-CM | POA: Diagnosis not present

## 2019-06-05 DIAGNOSIS — R0602 Shortness of breath: Secondary | ICD-10-CM | POA: Diagnosis not present

## 2019-06-05 DIAGNOSIS — I5032 Chronic diastolic (congestive) heart failure: Secondary | ICD-10-CM | POA: Diagnosis not present

## 2019-06-05 LAB — COMPREHENSIVE METABOLIC PANEL
ALT: 22 U/L (ref 0–35)
AST: 17 U/L (ref 0–37)
Albumin: 4 g/dL (ref 3.5–5.2)
Alkaline Phosphatase: 92 U/L (ref 39–117)
BUN: 16 mg/dL (ref 6–23)
CO2: 27 mEq/L (ref 19–32)
Calcium: 9.2 mg/dL (ref 8.4–10.5)
Chloride: 102 mEq/L (ref 96–112)
Creatinine, Ser: 0.98 mg/dL (ref 0.40–1.20)
GFR: 70.27 mL/min (ref >60.00)
Glucose, Bld: 314 mg/dL — ABNORMAL HIGH (ref 70–99)
Potassium: 3.9 mEq/L (ref 3.5–5.1)
Sodium: 137 mEq/L (ref 135–145)
Total Bilirubin: 0.2 mg/dL (ref 0.2–1.2)
Total Protein: 7.7 g/dL (ref 6.0–8.3)

## 2019-06-05 LAB — D-DIMER, QUANTITATIVE: D-Dimer, Quant: 0.48 mcg/mL FEU (ref <0.50)

## 2019-06-05 NOTE — Addendum Note (Signed)
Addended by: Suzzanne Cloud E on: 06/05/2019 10:18 AM   Modules accepted: Orders

## 2019-06-05 NOTE — Assessment & Plan Note (Signed)
Doubt true asthma Normal spirometry in the past No audible wheeze on exam today I believe most symptoms are directly related to fluid overload and diastolic congestive heart failure  Plan: Okay to continue Symbicort at this time Likely need to obtain formal pulmonary function testing

## 2019-06-05 NOTE — Assessment & Plan Note (Signed)
Her dyspnea is likely multifactorial due to BMI of 56, diastolic heart failure, continued weight gain, sedentary lifestyle.  We will obtain baseline lab work today.  I have encouraged patient to follow-up with cardiology immediately  Plan: Lab work today Inform patient to follow-up with cardiology

## 2019-06-05 NOTE — Assessment & Plan Note (Signed)
Plan: Lab work today Patient instructed to contact cardiology regarding weight gain and plan of care I believe appointment needs to be moved up from later this month

## 2019-06-05 NOTE — Assessment & Plan Note (Signed)
Managed by neurology  Plan: Continue CPAP use

## 2019-06-05 NOTE — Patient Instructions (Addendum)
You were seen today by Lauraine Rinne, NP  for:   1. Chronic diastolic heart failure (HCC)  - Pro b natriuretic peptide (BNP); Future - Comp Met (CMET); Future  Patient Education for Congestive Heart Failure  Do the following things EVERY DAY:   1. Weigh yourself EVERY morning after you go to the bathroom but before you eat or drink anything. Write this number down in a weight log/diary.    2. Take your medicines as prescribed. If you have concerns about your medications, please call us before you stop taking them.    3. Eat low salt foods--Limit salt (sodium) to 2000 mg per day. This will help prevent your body from holding onto fluid. Read food labels as many processed foods have a lot of sodium, especially canned goods and prepackaged meats. If you would like some assistance choosing low sodium foods, we would be happy to set you up with a nutritionist.   4. Stay as active as you can everyday. Staying active will give you more energy and make your muscles stronger. Start with 5 minutes at a time and work your way up to 30 minutes a day. Break up your activities--do some in the morning and some in the afternoon. Start with 3 days per week and work your way up to 5 days as you can.  If you have chest pain, feel short of breath, dizzy, or lightheaded, STOP. If you don't feel better after a short rest, call 911. If you do feel better, call the office to let us know you have symptoms with exercise.   5. Limit all fluids for the day to less than 2 liters. Fluid includes all drinks, coffee, juice, ice chips, soup, jello, and all other liquids.   2. Shortness of breath  - Pro b natriuretic peptide (BNP); Future - Comp Met (CMET); Future   Please contact cardiology today and notify them that you have had a weight increase.  That you continue to be symptomatic despite you taking your medications and following a low-sodium diet.  We recommend today:  Orders Placed This Encounter  Procedures  .  Pro b natriuretic peptide (BNP)    Standing Status:   Future    Standing Expiration Date:   06/04/2020    Order Specific Question:   Release to patient    Answer:   Immediate  . Comp Met (CMET)    Standing Status:   Future    Standing Expiration Date:   06/04/2020    Order Specific Question:   Has the patient fasted?    Answer:   Yes   Orders Placed This Encounter  Procedures  . Pro b natriuretic peptide (BNP)  . Comp Met (CMET)   No orders of the defined types were placed in this encounter.   Follow Up:    Return in about 4 weeks (around 07/03/2019), or if symptoms worsen or fail to improve, for Follow up with Dr. Elsworth Soho.   Please do your part to reduce the spread of COVID-19:      Reduce your risk of any infection  and COVID19 by using the similar precautions used for avoiding the common cold or flu:  Marland Kitchen Wash your hands often with soap and warm water for at least 20 seconds.  If soap and water are not readily available, use an alcohol-based hand sanitizer with at least 60% alcohol.  . If coughing or sneezing, cover your mouth and nose by coughing or sneezing into the  elbow areas of your shirt or coat, into a tissue or into your sleeve (not your hands). Langley Gauss A MASK when in public  . Avoid shaking hands with others and consider head nods or verbal greetings only. . Avoid touching your eyes, nose, or mouth with unwashed hands.  . Avoid close contact with people who are sick. . Avoid places or events with large numbers of people in one location, like concerts or sporting events. . If you have some symptoms but not all symptoms, continue to monitor at home and seek medical attention if your symptoms worsen. . If you are having a medical emergency, call 911.   [place] / e-Visit: [email or web address]         [name]: [facility]: [phone]                    [facility]: [phone]     It is flu season:   >>> Best ways to protect herself from the flu: Receive the yearly flu vaccine, practice good hand hygiene washing with soap and also using hand sanitizer when available, eat a nutritious meals, get adequate rest, hydrate appropriately   Please contact the office if your symptoms worsen or you have concerns that you are not improving.   Thank you for choosing [facility] for your healthcare, and for allowing [place] to partner with you on your healthcare journey. I am thankful to be able to provide care to you today.   Wyn Quaker FNP-C    DASH Eating Plan DASH stands for "Dietary Approaches to Stop Hypertension." The DASH eating plan is a healthy eating plan that has been shown to reduce high blood pressure (hypertension). It may also reduce your risk for type 2 diabetes, heart disease, and stroke. The DASH eating plan may also help with weight loss. What are tips for following this plan?  General guidelines  Avoid eating more than 2,300 mg (milligrams) of salt (sodium) a day. If you have hypertension, you may need to reduce your sodium intake to 1,500 mg a day.  Limit alcohol intake to no more than 1 drink a day for nonpregnant women and 2 drinks a day for men. One drink equals 12 oz of beer, 5 oz of wine, or 1 oz of hard liquor.  Work with your health care provider to maintain a healthy body weight or to lose weight. Ask what an ideal weight is for you.  Get at least 30 minutes of exercise that causes your heart to beat faster (aerobic exercise) most days of the week. Activities may include walking, swimming, or biking.  Work with your health care provider or diet and nutrition specialist (dietitian) to adjust your eating plan to your individual calorie needs. Reading food labels   Check food labels for the amount of sodium per serving. Choose foods with less than 5 percent of the Daily  Value of sodium. Generally, foods with less than 300 mg of sodium per serving fit into this eating plan.  To find whole grains, look for the word "whole" as the first word in the ingredient list. Shopping  Buy products labeled as "low-sodium" or "no salt added."  Buy fresh foods. Avoid canned foods and premade or frozen meals. Cooking  Avoid adding salt when cooking. Use salt-free seasonings or herbs instead of table salt or sea salt. Check with your health care provider or  pharmacist before using salt substitutes.  Do not fry foods. Cook foods using healthy methods such as baking, boiling, grilling, and broiling instead.  Cook with heart-healthy oils, such as olive, canola, soybean, or sunflower oil. Meal planning  Eat a balanced diet that includes: ? 5 or more servings of fruits and vegetables each day. At each meal, try to fill half of your plate with fruits and vegetables. ? Up to 6-8 servings of whole grains each day. ? Less than 6 oz of lean meat, poultry, or fish each day. A 3-oz serving of meat is about the same size as a deck of cards. One egg equals 1 oz. ? 2 servings of low-fat dairy each day. ? A serving of nuts, seeds, or beans 5 times each week. ? Heart-healthy fats. Healthy fats called Omega-3 fatty acids are found in foods such as flaxseeds and coldwater fish, like sardines, salmon, and mackerel.  Limit how much you eat of the following: ? Canned or prepackaged foods. ? Food that is high in trans fat, such as fried foods. ? Food that is high in saturated fat, such as fatty meat. ? Sweets, desserts, sugary drinks, and other foods with added sugar. ? Full-fat dairy products.  Do not salt foods before eating.  Try to eat at least 2 vegetarian meals each week.  Eat more home-cooked food and less restaurant, buffet, and fast food.  When eating at a restaurant, ask that your food be prepared with less salt or no salt, if possible. What foods are recommended? The  items listed may not be a complete list. Talk with your dietitian about what dietary choices are best for you. Grains Whole-grain or whole-wheat bread. Whole-grain or whole-wheat pasta. Brown rice. [place] Morrow. Bulgur. Whole-grain and low-sodium cereals. Pita bread. Low-fat, low-sodium crackers. Whole-wheat flour tortillas. Vegetables Fresh or frozen vegetables (raw, steamed, roasted, or grilled). Low-sodium or reduced-sodium tomato and vegetable juice. Low-sodium or reduced-sodium tomato sauce and tomato paste. Low-sodium or reduced-sodium canned vegetables. Fruits All fresh, dried, or frozen fruit. Canned fruit in natural juice (without added sugar). Meat and other protein foods Skinless chicken or [place]. Ground chicken or [place]. Pork with fat trimmed off. Fish and seafood. Egg whites. Dried beans, peas, or lentils. Unsalted nuts, nut butters, and seeds. Unsalted canned beans. Lean cuts of beef with fat trimmed off. Low-sodium, lean deli meat. Dairy Low-fat (1%) or fat-free (skim) milk. Fat-free, low-fat, or reduced-fat cheeses. Nonfat, low-sodium ricotta or cottage cheese. Low-fat or nonfat yogurt. Low-fat, low-sodium cheese. Fats and oils Soft margarine without trans fats. Vegetable oil. Low-fat, reduced-fat, or light mayonnaise and salad dressings (reduced-sodium). Canola, safflower, olive, soybean, and sunflower oils. Avocado. Seasoning and other foods Herbs. Spices. Seasoning mixes without salt. Unsalted popcorn and pretzels. Fat-free sweets. What foods are not recommended? The items listed may not be a complete list. Talk with your dietitian about what dietary choices are best for you. Grains Baked goods made with fat, such as croissants, muffins, or some breads. Dry pasta or rice meal packs. Vegetables Creamed or fried vegetables. Vegetables in a cheese sauce. Regular canned vegetables (not low-sodium or reduced-sodium). Regular canned tomato sauce and paste (not low-sodium or  reduced-sodium). Regular tomato and vegetable juice (not low-sodium or reduced-sodium). Angie Fava. Olives. Fruits Canned fruit in a light or heavy syrup. Fried fruit. Fruit in cream or butter sauce. Meat and other protein foods Fatty cuts of meat. Ribs. Fried meat. Berniece Salines. Sausage. Bologna and other processed lunch meats. Salami. Fatback. Hotdogs. Bratwurst. Salted  nuts and seeds. Canned beans with added salt. Canned or smoked fish. Whole eggs or egg yolks. Chicken or [place] with skin. Dairy Whole or 2% milk, cream, and half-and-half. Whole or full-fat cream cheese. Whole-fat or sweetened yogurt. Full-fat cheese. Nondairy creamers. Whipped toppings. Processed cheese and cheese spreads. Fats and oils Butter. Stick margarine. Lard. Shortening. Ghee. Bacon fat. Tropical oils, such as coconut, palm kernel, or palm oil. Seasoning and other foods Salted popcorn and pretzels. Onion salt, garlic salt, seasoned salt, table salt, and sea salt. Worcestershire sauce. Tartar sauce. Barbecue sauce. Teriyaki sauce. Soy sauce, including reduced-sodium. Steak sauce. Canned and packaged gravies. Fish sauce. Oyster sauce. Cocktail sauce. Horseradish that you find on the shelf. Ketchup. Mustard. Meat flavorings and tenderizers. Bouillon cubes. Hot sauce and Tabasco sauce. Premade or packaged marinades. Premade or packaged taco seasonings. Relishes. Regular salad dressings. Where to find more information:  National Heart, Lung, and [place]: https://wilson-eaton.com/  American Heart Association: www.heart.org Summary  The DASH eating plan is a healthy eating plan that has been shown to reduce high blood pressure (hypertension). It may also reduce your risk for type 2 diabetes, heart disease, and stroke.  With the DASH eating plan, you should limit salt (sodium) intake to 2,300 mg a day. If you have hypertension, you may need to reduce your sodium intake to 1,500 mg a day.  When on the DASH eating plan, aim to eat more  fresh fruits and vegetables, whole grains, lean proteins, low-fat dairy, and heart-healthy fats.  Work with your health care provider or diet and nutrition specialist (dietitian) to adjust your eating plan to your individual calorie needs. This information is not intended to replace advice given to you by your health care provider. Make sure you discuss any questions you have with your health care provider. Document Revised: 03/30/2017 Document Reviewed: 04/10/2016 Elsevier Patient Education  2020 [employer].

## 2019-06-05 NOTE — Progress Notes (Signed)
$'@Patient'T$  ID: Angela [name], female    DOB: March 26, 1961, 59 y.o.   MRN: [id number]  Chief Complaint  Patient presents with  . Follow-up    4 wk f/u for SOB and increased wheezing.     Referring provider: Fayrene Helper, MD  HPI:  59 year old female never smoker reconsulted with our practice in January/2021 for dyspnea on exertion  PMH: Hypertension, chronic pain, obstructive sleep apnea (managed by neurology) Smoker/ Smoking History: Never smoker Maintenance:   Pt of: Dr. Elsworth Soho  06/05/2019  - Visit   59 year old female never smoker initially consulted with our practice in January/2021 for persistent dyspnea on exertion and wheezing.  Patient reporting that she has had intermittent wheezing and dry cough since her cardiac cath in January/2020.  She also has obstructive sleep apnea and is managed on a CPAP which she was compliant with using managed by neurology.  There is a suspicion of asthma years ago patient has normal spirometry.  Patient presenting today reporting that her symptoms remain unchanged.  Unfortunately her weight has increased despite her reporting that she is following a low-sodium diet, weighing herself daily as well as taking her diuretics as prescribed.  She has not contacted cardiology regarding her weight increase yet.  She is concerned because cardiology had informed her that if her weight continues to increase that she will need to be admitted to the hospital for IV diuretics.  Patient also reports adherence to her CPAP.  This is managed by neurology.  Patient reports that her best contact information is: [place] / Pulmonary Flowsheets:   MMRC: mMRC Dyspnea Scale mMRC Score  06/05/2019 3    Tests:   Cardiac cath 05/2018 no significant CAD, LVEDP 14  Echo 10/01/13 >> EF 60 to 76%, grade 1 diastolic dysfx, mild LVH  Echo 05/9507 grade 2 diastolic dysfunction, normal LVEF  2015 normal spirometry 01/2016 split-night study-AHI 22/hour,  CPAP 10 cm  05/06/2018-CBC with differential-eosinophils relative 2, eosinophils absolute 0.2  FENO:  No results found for: NITRICOXIDE  PFT: No flowsheet data found.  WALK:  No flowsheet data found.  Imaging: US SOFT TISSUE RT UPPER EXTREMITY LTD (NON-VASCULAR)  Result Date: 05/14/2019 CLINICAL DATA:  59 year old female with right wrist pain at the site of a prior radial artery access for a cardiac catheterization which occurred approximately 1 year ago. EXAM: ULTRASOUND RIGHT UPPER EXTREMITY LIMITED WITH DOPPLER TECHNIQUE: Ultrasound examination of the upper extremity soft tissues was performed in the area of clinical concern. COMPARISON:  None. FINDINGS: No evidence of solid or cystic mass. No lymphadenopathy. Normal sonographic appearance of the radial artery and paired radial veins. The radial artery is patent with normal triphasic arterial flow. IMPRESSION: No abnormality identified sonographically. Electronically Signed   By: Jacqulynn Cadet M.D.   On: 05/14/2019 08:37    Lab Results:  CBC    Component Value Date/Time   WBC 5.9 05/09/2018 0340   RBC 4.88 05/09/2018 0340   HGB 12.3 05/09/2018 0340   HCT 40.0 05/09/2018 0340   PLT 381 05/09/2018 0340   MCV 82.0 05/09/2018 0340   MCH 25.2 (L) 05/09/2018 0340   MCHC 30.8 05/09/2018 0340   RDW 14.5 05/09/2018 0340   LYMPHSABS 2.6 05/06/2018 0335   MONOABS 0.5 05/06/2018 0335   EOSABS 0.2 05/06/2018 0335   BASOSABS 0.0 05/06/2018 0335    BMET    Component Value Date/Time   NA 139 12/19/2018 1321   K 4.1 12/19/2018 1321  CL 101 12/19/2018 1321   CO2 29 12/19/2018 1321   GLUCOSE 228 (H) 12/19/2018 1321   BUN 14 12/19/2018 1321   CREATININE 0.96 12/19/2018 1321   CALCIUM 9.8 12/19/2018 1321   GFRNONAA 65 12/19/2018 1321   GFRAA 76 12/19/2018 1321    BNP No results found for: BNP  ProBNP No results found for: PROBNP  Specialty Problems      Pulmonary Problems   Asthma    Spirometry wnl 05/15/2013 off all  rx with active "wheeze"        DYSPNEA    Qualifier: Diagnosis of  By: Moshe Cipro MD, Margaret        Chronic bronchitis [facility])   Sleep apnea syndrome    Started back on CPAP in 05/2016, treated in [place]      Upper airway cough syndrome    Diagnosed in 2015 by pulmonary Dr Melvyn Novas      Allergic rhinitis   Wheezing      Allergies  Allergen Reactions  . Morphine Other (See Comments)    Reaction with esophagus, unable to swallow.   . Ace Inhibitors Cough  . Aspirin Other (See Comments)    Reaction with esophagus, unable to swallow.   . Losartan Cough  . Tapentadol Other (See Comments)    Nausea, increased sleepiness, h/a  . Tomato Other (See Comments)    Acid reflux due to acid in tomato  . Adhesive [Tape] Rash    Immunization History  Administered Date(s) Administered  . Influenza Split 03/30/2011  . Influenza Whole 03/21/2005, 02/09/2006  . Influenza,inj,Quad PF,6+ Mos 02/11/2014, 07/27/2015, 03/09/2016, 01/29/2017, 02/07/2018, 01/30/2019  . Influenza-Unspecified 01/29/2017  . Pneumococcal Polysaccharide-23 04/17/2006, 09/14/2015  . Td 04/17/2006    Past Medical History:  Diagnosis Date  . Anxiety   . Arthritis   . Asthma   . Coronary atherosclerosis of native coronary artery    Mild nonobstructive CAD by cardiac catheterization 2008 - Dr. Terrence Dupont  . Degenerative disc disease   . Depression   . Dysrhythmia   . Essential hypertension, benign   . Folliculitis 2/72/5366  . GERD (gastroesophageal reflux disease)   . H/O hiatal hernia   . Helicobacter pylori gastritis 2008  . Hypercholesterolemia   . Hypertension   . Hyperthyroidism    s/p radiation  . Left-sided epistaxis 05/15/2018  . Low back pain   . Migraine   . Nephrolithiasis    Recurring episodes since 2004  . Severe obesity (BMI >= 40) (HCC)   . Sleep apnea    STOP BANG SCORE 6no cpap used  . Type 2 diabetes mellitus with diabetic neuropathy (HCC)     Tobacco History: Social History    Tobacco Use  Smoking Status Never Smoker  Smokeless Tobacco Never Used   Counseling given: Yes   Continue to not smoke  Outpatient Encounter Medications as of 06/05/2019  Medication Sig  . albuterol (PROAIR HFA) 108 (90 Base) MCG/ACT inhaler INHALE 2 PUFFS BY MOUTH EVERY 6-8HRS AS NEEDED (Patient taking differently: Inhale 2 puffs into the lungs every 6 (six) hours as needed for wheezing or shortness of breath. )  . albuterol (VENTOLIN HFA) 108 (90 Base) MCG/ACT inhaler Inhale 2 puffs into the lungs every 6 (six) hours as needed for wheezing or shortness of breath.  Marland Kitchen amLODipine (NORVASC) 10 MG tablet Take 1 tablet (10 mg total) by mouth daily.  . blood glucose meter kit and supplies Dispense based on patient and insurance preference. Once daily testing dx. e11.9  .  calcipotriene (DOVONOX) 0.005 % ointment APPLY TO AFFECTED AREA OF LEGS AND ELBOWS DAILY  . cloNIDine (CATAPRES - DOSED IN MG/24 HR) 0.3 mg/24hr patch APPLY 1 PATCH TO SKIN ONCE A WEEK  . cloNIDine (CATAPRES) 0.3 MG tablet TAKE 1 TABLET (0.3 MG TOTAL) BY MOUTH AT BEDTIME.  . cyclobenzaprine (FLEXERIL) 10 MG tablet TAKE 1 TABLET BY MOUTH THREE TIMES A DAY  . diazepam (VALIUM) 5 MG tablet Take one tablet by mouth three times daily for back spasm  . DULoxetine (CYMBALTA) 60 MG capsule TAKE 1 CAPSULE BY MOUTH TWICE A DAY (Patient taking differently: Take 60 mg by mouth 2 (two) times daily. )  . ergocalciferol (VITAMIN D2) 50000 units capsule Take 1 capsule (50,000 Units total) by mouth once a week. One capsule once weekly  . esomeprazole (NEXIUM) 40 MG capsule TAKE 1 CAPSULE BY MOUTH EVERY DAY  . fluconazole (DIFLUCAN) 150 MG tablet Take one tablet by mouth once daily, as needed, for vaginal itch associated with whig dose prednisone and diabetes  . fluticasone (FLONASE) 50 MCG/ACT nasal spray Place 2 sprays into both nostrils daily. (Patient taking differently: Place 2 sprays into both nostrils daily as needed for allergies. )  .  gabapentin (NEURONTIN) 800 MG tablet TAKE 1 TABLET BY MOUTH FOUR TIMES A DAY  . glipiZIDE (GLUCOTROL) 10 MG tablet Take 10 mg by mouth daily before breakfast.  . hydrALAZINE (APRESOLINE) 25 MG tablet Take by mouth.  . hydrOXYzine (ATARAX/VISTARIL) 10 MG tablet Take 10-30 mg by mouth at bedtime.   . Insulin Glargine (LANTUS SOLOSTAR) 100 UNIT/ML Solostar Pen Inject 80 Units into the skin at bedtime.  . Insulin Pen Needle (B-D ULTRAFINE III SHORT PEN) 31G X 8 MM MISC 1 each by Does not apply route as directed.  Marland Kitchen ipratropium-albuterol (DUONEB) 0.5-2.5 (3) MG/3ML SOLN Take 3 mLs by nebulization every 6 (six) hours as needed. (Patient taking differently: Take 3 mLs by nebulization every 6 (six) hours as needed (SOB). )  . ipratropium-albuterol (DUONEB) 0.5-2.5 (3) MG/3ML SOLN Take 3 mLs by nebulization every 6 (six) hours as needed.  Marland Kitchen KLOR-CON M20 20 MEQ tablet TAKE 1 TABLET BY MOUTH TWICE A DAY  . Lancets (ONETOUCH DELICA PLUS [id number]) MISC TEST ONCE DAILY  . levothyroxine (SYNTHROID) 50 MCG tablet TAKE 1 TABLET BY MOUTH DAILY BEFORE BREAKFAST  . loratadine (CLARITIN) 10 MG tablet Take 1 tablet (10 mg total) by mouth daily.  . Magnesium Oxide 400 MG CAPS Take one  . meclizine (ANTIVERT) 25 MG tablet Take 25 mg by mouth 3 (three) times daily as needed for dizziness.  . metFORMIN (GLUCOPHAGE) 1000 MG tablet Take 1 tablet (1,000 mg total) by mouth 2 (two) times daily.  . montelukast (SINGULAIR) 10 MG tablet TAKE 1 TABLET (10 MG TOTAL) BY MOUTH AT BEDTIME.  . nitroGLYCERIN (NITROSTAT) 0.4 MG SL tablet Place 1 tablet (0.4 mg total) under the tongue every 5 (five) minutes as needed for chest pain.  Marland Kitchen ondansetron (ZOFRAN) 4 MG tablet Take 1 tablet (4 mg total) by mouth every 8 (eight) hours as needed for nausea or vomiting.  Glory Rosebush VERIO test strip USE AS INSTRUCTED FOUR TIMES DAILY TESTING DX E11.65  . oxyCODONE (OXYCONTIN) 40 mg 12 hr tablet Take 1 tablet (40 mg total) by mouth every 12 (twelve)  hours.  Derrill Memo ON 06/10/2019] oxyCODONE (OXYCONTIN) 40 mg 12 hr tablet Take 1 tablet (40 mg total) by mouth every 12 (twelve) hours.  . predniSONE (STERAPRED UNI-PAK 21 TAB)  5 MG (21) TBPK tablet Take 1 tablet (5 mg total) by mouth as directed. Use as directed  . rosuvastatin (CRESTOR) 40 MG tablet TAKE 1 TABLET BY MOUTH EVERY DAY  . SYMBICORT 160-4.5 MCG/ACT inhaler TAKE 2 PUFFS BY MOUTH TWICE A DAY  . triamterene-hydrochlorothiazide (MAXZIDE) 75-50 MG tablet Take 1 tablet by mouth daily.  . metoprolol tartrate (LOPRESSOR) 25 MG tablet Take 1 tablet (25 mg total) by mouth 2 (two) times daily.  Marland Kitchen zolpidem (AMBIEN) 10 MG tablet Take 1 tablet (10 mg total) by mouth at bedtime.   No facility-administered encounter medications on file as of 06/05/2019.     Review of Systems  Review of Systems  Constitutional: Positive for fatigue. Negative for activity change and fever.  HENT: Negative for sinus pressure, sinus pain and sore throat.   Respiratory: Positive for shortness of breath. Negative for cough and wheezing.   Cardiovascular: Positive for leg swelling. Negative for chest pain and palpitations.  Gastrointestinal: Positive for abdominal distention. Negative for diarrhea, nausea and vomiting.  Musculoskeletal: Negative for arthralgias.  Neurological: Negative for dizziness.  Psychiatric/Behavioral: Negative for sleep disturbance. The patient is not nervous/anxious.      Physical Exam  BP 126/72 (BP Location: Left Arm, Patient Position: Sitting, Cuff Size: Large)   Pulse 79   Temp 98.4 F (36.9 C) (Temporal)   Ht '5\' 4"'$  (1.626 m)   Wt (!) 330 lb (149.7 kg)   SpO2 100% Comment: on RA  BMI 56.64 kg/m   Wt Readings from Last 5 Encounters:  06/05/19 (!) 330 lb (149.7 kg)  05/08/19 (!) 323 lb (146.5 kg)  05/05/19 (!) 322 lb 9.6 oz (146.3 kg)  04/15/19 (!) 323 lb (146.5 kg)  04/08/19 (!) 323 lb (146.5 kg)    BMI Readings from Last 5 Encounters:  06/05/19 56.64 kg/m  05/08/19  55.44 kg/m  05/05/19 55.37 kg/m  04/15/19 55.44 kg/m  04/08/19 55.44 kg/m     Physical Exam Vitals and nursing note reviewed.  Constitutional:      Appearance: Normal appearance. She is obese.  HENT:     Head: Normocephalic and atraumatic.     Right Ear: Tympanic membrane, ear canal and external ear normal. There is no impacted cerumen.     Left Ear: Tympanic membrane, ear canal and external ear normal. There is no impacted cerumen.     Mouth/Throat:     Mouth: Mucous membranes are moist.     Pharynx: Oropharynx is clear.  Eyes:     Pupils: Pupils are equal, round, and reactive to light.  Cardiovascular:     Rate and Rhythm: Normal rate and regular rhythm.     Pulses: Normal pulses.     Heart sounds: Normal heart sounds. No murmur.  Pulmonary:     Breath sounds: Normal breath sounds. No decreased air movement. No decreased breath sounds, wheezing or rales.     Comments: Diminished breath sounds throughout exam Abdominal:     General: Bowel sounds are normal. There is distension.     Palpations: Abdomen is soft.     Comments: Likely abdominal swelling  Musculoskeletal:        General: Swelling (Abdominal) present.     Cervical back: Normal range of motion.     Right lower leg: Edema (1-2+) present.     Left lower leg: Edema (1-2+) present.  Skin:    General: Skin is warm and dry.     Capillary Refill: Capillary refill takes less than 2  seconds.  Neurological:     General: No focal deficit present.     Mental Status: She is alert and oriented to person, place, and time. Mental status is at baseline.     Gait: Gait normal.  Psychiatric:        Mood and Affect: Mood normal.        Behavior: Behavior normal.        Thought Content: Thought content normal.        Judgment: Judgment normal.       Assessment & Plan:   Sleep apnea syndrome Managed by neurology  Plan: Continue CPAP use  Asthma Doubt true asthma Normal spirometry in the past No audible wheeze  on exam today I believe most symptoms are directly related to fluid overload and diastolic congestive heart failure  Plan: Okay to continue Symbicort at this time Likely need to obtain formal pulmonary function testing  Chronic diastolic heart failure ([place]) Plan: Lab work today Patient instructed to contact cardiology regarding weight gain and plan of care I believe appointment needs to be moved up from later this month  DYSPNEA Her dyspnea is likely multifactorial due to BMI of 56, diastolic heart failure, continued weight gain, sedentary lifestyle.  We will obtain baseline lab work today.  I have encouraged patient to follow-up with cardiology immediately  Plan: Lab work today Inform patient to follow-up with cardiology    Return in about 4 weeks (around 07/03/2019), or if symptoms worsen or fail to improve, for Follow up with Dr. Elsworth Soho.   Lauraine Rinne, NP 06/05/2019   This appointment required 34 minutes of patient care (this includes precharting, chart review, review of results, face-to-face care, etc.).

## 2019-06-06 ENCOUNTER — Other Ambulatory Visit: Payer: Self-pay | Admitting: "Endocrinology

## 2019-06-06 LAB — PRO B NATRIURETIC PEPTIDE: NT-Pro BNP: 194 pg/mL (ref 0–287)

## 2019-06-09 ENCOUNTER — Telehealth: Payer: Self-pay | Admitting: Pulmonary Disease

## 2019-06-09 NOTE — Telephone Encounter (Signed)
Angela Rinne, NP  06/06/2019 8:48 AM EST    Lab work stable. Fluid marker is not significantly elevated. Patient still needs to follow-up with cardiology regarding weight increases and fluid management.  Patient's blood sugars on lab work were elevated. Had patient recently eaten prior to our visit?  Patient should follow up with primary care regarding this as it looks like her blood sugars have been routinely elevated on blood work in the past.   Angela Quaker, FNP  --------------------------------------------------------------------------- Spoke with pt. She is aware of her lab results. States that she did not take of her medications prior to having blood work, pt is DM type 1. That is why her glucose was high.

## 2019-06-16 ENCOUNTER — Ambulatory Visit: Payer: Medicare Other | Admitting: Podiatry

## 2019-06-24 ENCOUNTER — Encounter: Payer: Self-pay | Admitting: Pulmonary Disease

## 2019-06-24 ENCOUNTER — Other Ambulatory Visit: Payer: Self-pay

## 2019-06-24 ENCOUNTER — Ambulatory Visit (INDEPENDENT_AMBULATORY_CARE_PROVIDER_SITE_OTHER): Payer: Medicare Other | Admitting: Pulmonary Disease

## 2019-06-24 VITALS — BP 120/82 | HR 87 | Temp 98.4°F | Ht 64.0 in | Wt 324.3 lb

## 2019-06-24 DIAGNOSIS — J4530 Mild persistent asthma, uncomplicated: Secondary | ICD-10-CM | POA: Diagnosis not present

## 2019-06-24 DIAGNOSIS — G4733 Obstructive sleep apnea (adult) (pediatric): Secondary | ICD-10-CM

## 2019-06-24 DIAGNOSIS — R0602 Shortness of breath: Secondary | ICD-10-CM

## 2019-06-24 DIAGNOSIS — I5032 Chronic diastolic (congestive) heart failure: Secondary | ICD-10-CM | POA: Diagnosis not present

## 2019-06-24 NOTE — Assessment & Plan Note (Signed)
Doubt this is true asthma History of normal spirometry in the past No audible wheeze on lung exam today  Plan: Okay to continue Symbicort this time We will order pulmonary function testing After completing pulmonary function testing patient would like to establish with the [facility] pulmonary [facility]

## 2019-06-24 NOTE — Assessment & Plan Note (Signed)
Plan: Continue to work with primary care regarding your BMI Reducing your BMI and losing around 10% of your body weight would be significantly helpful with improving your breathing

## 2019-06-24 NOTE — Progress Notes (Signed)
$'@Patient'i$  ID: Angela [name], female    DOB: 03-Feb-1961, 58 y.o.   MRN: [id number]  Chief Complaint  Patient presents with  . Follow-up    4 wk f/u for increased SOB. States she has lost a few pounds since last visit. SOB has decreased.     Referring provider: Fayrene Helper, MD  HPI:  59 year old female never smoker reconsulted with our practice in January/2021 for dyspnea on exertion  PMH: Hypertension, chronic pain, obstructive sleep apnea (managed by neurology) Smoker/ Smoking History: Never smoker Maintenance:  none Pt of: Dr. Elsworth Soho   06/24/2019  - Visit   59 year old female never smoker followed in our office for dyspnea on exertion.  This is a 4-week follow-up.  Patient reports that shortness of breath has improved she is also worked on losing a few pounds.  She is also followed by cardiology for diastolic heart failure.  She has obstructive sleep apnea her CPAP is managed by neurology.  Overall patient has been doing better since last being seen.  She reports that she has lost 6 pounds.  She reports her shortness of breath is improved.  Unfortunately patient did not follow-up with cardiology as recommended.  She reports that she did take statins that they were aware that she was retaining fluid.  She has an upcoming follow-up with them this week.  Questionaires / Pulmonary Flowsheets:   MMRC: mMRC Dyspnea Scale mMRC Score  06/24/2019 3  06/05/2019 3   Tests:   Cardiac cath 05/2018 no significant CAD, LVEDP 14  Echo 10/01/13 >> EF 60 to 47%, grade 1 diastolic dysfx, mild LVH  Echo 07/2593 grade 2 diastolic dysfunction, normal LVEF  2015 normal spirometry 01/2016 split-night study-AHI 22/hour, CPAP 10 cm  05/06/2018-CBC with differential-eosinophils relative 2, eosinophils absolute 0.2   FENO:  No results found for: NITRICOXIDE  PFT: No flowsheet data found.  WALK:  No flowsheet data found.  Imaging: No results found.  Lab Results:  CBC    Component  Value Date/Time   WBC 5.9 05/09/2018 0340   RBC 4.88 05/09/2018 0340   HGB 12.3 05/09/2018 0340   HCT 40.0 05/09/2018 0340   PLT 381 05/09/2018 0340   MCV 82.0 05/09/2018 0340   MCH 25.2 (L) 05/09/2018 0340   MCHC 30.8 05/09/2018 0340   RDW 14.5 05/09/2018 0340   LYMPHSABS 2.6 05/06/2018 0335   MONOABS 0.5 05/06/2018 0335   EOSABS 0.2 05/06/2018 0335   BASOSABS 0.0 05/06/2018 0335    BMET    Component Value Date/Time   NA 137 06/05/2019 1019   K 3.9 06/05/2019 1019   CL 102 06/05/2019 1019   CO2 27 06/05/2019 1019   GLUCOSE 314 (H) 06/05/2019 1019   BUN 16 06/05/2019 1019   CREATININE 0.98 06/05/2019 1019   CREATININE 0.96 12/19/2018 1321   CALCIUM 9.2 06/05/2019 1019   GFRNONAA 65 12/19/2018 1321   GFRAA 76 12/19/2018 1321    BNP No results found for: BNP  ProBNP    Component Value Date/Time   PROBNP 194 06/05/2019 1019    Specialty Problems      Pulmonary Problems   Asthma    Spirometry wnl 05/15/2013 off all rx with active "wheeze"        DYSPNEA    Qualifier: Diagnosis of  By: Moshe Cipro MD, Margaret        Chronic bronchitis [facility])   Sleep apnea syndrome    Started back on CPAP in 05/2016, treated in [place]  Upper airway cough syndrome    Diagnosed in 2015 by pulmonary Dr Melvyn Novas      Allergic rhinitis   Wheezing      Allergies  Allergen Reactions  . Morphine Other (See Comments)    Reaction with esophagus, unable to swallow.   . Ace Inhibitors Cough  . Aspirin Other (See Comments)    Reaction with esophagus, unable to swallow.   . Losartan Cough  . Tapentadol Other (See Comments)    Nausea, increased sleepiness, h/a  . Tomato Other (See Comments)    Acid reflux due to acid in tomato  . Adhesive [Tape] Rash    Immunization History  Administered Date(s) Administered  . Influenza Split 03/30/2011  . Influenza Whole 03/21/2005, 02/09/2006  . Influenza,inj,Quad PF,6+ Mos 02/11/2014, 07/27/2015, 03/09/2016, 01/29/2017,  02/07/2018, 01/30/2019  . Influenza-Unspecified 01/29/2017  . Pneumococcal Polysaccharide-23 04/17/2006, 09/14/2015  . Td 04/17/2006    Past Medical History:  Diagnosis Date  . Anxiety   . Arthritis   . Asthma   . Coronary atherosclerosis of native coronary artery    Mild nonobstructive CAD by cardiac catheterization 2008 - Dr. Terrence Dupont  . Degenerative disc disease   . Depression   . Dysrhythmia   . Essential hypertension, benign   . Folliculitis 01/21/3006  . GERD (gastroesophageal reflux disease)   . H/O hiatal hernia   . Helicobacter pylori gastritis 2008  . Hypercholesterolemia   . Hypertension   . Hyperthyroidism    s/p radiation  . Left-sided epistaxis 05/15/2018  . Low back pain   . Migraine   . Nephrolithiasis    Recurring episodes since 2004  . Severe obesity (BMI >= 40) (HCC)   . Sleep apnea    STOP BANG SCORE 6no cpap used  . Type 2 diabetes mellitus with diabetic neuropathy (HCC)     Tobacco History: Social History   Tobacco Use  Smoking Status Never Smoker  Smokeless Tobacco Never Used   Counseling given: Not Answered   Continue to not smoke  Outpatient Encounter Medications as of 06/24/2019  Medication Sig  . albuterol (VENTOLIN HFA) 108 (90 Base) MCG/ACT inhaler Inhale 2 puffs into the lungs every 6 (six) hours as needed for wheezing or shortness of breath.  Marland Kitchen amLODipine (NORVASC) 10 MG tablet Take 1 tablet (10 mg total) by mouth daily.  . B-D UF III MINI PEN NEEDLES 31G X 5 MM MISC USE ONCE DAILY AS DIRECTED  . blood glucose meter kit and supplies Dispense based on patient and insurance preference. Once daily testing dx. e11.9  . calcipotriene (DOVONOX) 0.005 % ointment APPLY TO AFFECTED AREA OF LEGS AND ELBOWS DAILY  . cloNIDine (CATAPRES - DOSED IN MG/24 HR) 0.3 mg/24hr patch APPLY 1 PATCH TO SKIN ONCE A WEEK  . cloNIDine (CATAPRES) 0.3 MG tablet TAKE 1 TABLET (0.3 MG TOTAL) BY MOUTH AT BEDTIME.  . cyclobenzaprine (FLEXERIL) 10 MG tablet TAKE 1  TABLET BY MOUTH THREE TIMES A DAY  . diazepam (VALIUM) 5 MG tablet Take one tablet by mouth three times daily for back spasm  . DULoxetine (CYMBALTA) 60 MG capsule TAKE 1 CAPSULE BY MOUTH TWICE A DAY (Patient taking differently: Take 60 mg by mouth 2 (two) times daily. )  . ergocalciferol (VITAMIN D2) 50000 units capsule Take 1 capsule (50,000 Units total) by mouth once a week. One capsule once weekly  . esomeprazole (NEXIUM) 40 MG capsule TAKE 1 CAPSULE BY MOUTH EVERY DAY  . fluconazole (DIFLUCAN) 150 MG tablet Take one  tablet by mouth once daily, as needed, for vaginal itch associated with whig dose prednisone and diabetes  . fluticasone (FLONASE) 50 MCG/ACT nasal spray Place 2 sprays into both nostrils daily. (Patient taking differently: Place 2 sprays into both nostrils daily as needed for allergies. )  . gabapentin (NEURONTIN) 800 MG tablet TAKE 1 TABLET BY MOUTH FOUR TIMES A DAY  . glipiZIDE (GLUCOTROL) 10 MG tablet Take 10 mg by mouth daily before breakfast.  . hydrALAZINE (APRESOLINE) 25 MG tablet Take by mouth.  . hydrOXYzine (ATARAX/VISTARIL) 10 MG tablet Take 10-30 mg by mouth at bedtime.   . Insulin Glargine (LANTUS SOLOSTAR) 100 UNIT/ML Solostar Pen Inject 80 Units into the skin at bedtime.  Marland Kitchen ipratropium-albuterol (DUONEB) 0.5-2.5 (3) MG/3ML SOLN Take 3 mLs by nebulization every 6 (six) hours as needed.  Marland Kitchen KLOR-CON M20 20 MEQ tablet TAKE 1 TABLET BY MOUTH TWICE A DAY  . Lancets (ONETOUCH DELICA PLUS QIONGE95M) MISC TEST ONCE DAILY  . levothyroxine (SYNTHROID) 50 MCG tablet TAKE 1 TABLET BY MOUTH DAILY BEFORE BREAKFAST  . loratadine (CLARITIN) 10 MG tablet Take 1 tablet (10 mg total) by mouth daily.  . Magnesium Oxide 400 MG CAPS Take one  . meclizine (ANTIVERT) 25 MG tablet Take 25 mg by mouth 3 (three) times daily as needed for dizziness.  . metFORMIN (GLUCOPHAGE) 1000 MG tablet Take 1 tablet (1,000 mg total) by mouth 2 (two) times daily.  . montelukast (SINGULAIR) 10 MG tablet  TAKE 1 TABLET (10 MG TOTAL) BY MOUTH AT BEDTIME.  . nitroGLYCERIN (NITROSTAT) 0.4 MG SL tablet Place 1 tablet (0.4 mg total) under the tongue every 5 (five) minutes as needed for chest pain.  Marland Kitchen ondansetron (ZOFRAN) 4 MG tablet Take 1 tablet (4 mg total) by mouth every 8 (eight) hours as needed for nausea or vomiting.  Glory Rosebush VERIO test strip USE AS INSTRUCTED FOUR TIMES DAILY TESTING DX E11.65  . oxyCODONE (OXYCONTIN) 40 mg 12 hr tablet Take 1 tablet (40 mg total) by mouth every 12 (twelve) hours.  . rosuvastatin (CRESTOR) 40 MG tablet TAKE 1 TABLET BY MOUTH EVERY DAY  . SYMBICORT 160-4.5 MCG/ACT inhaler TAKE 2 PUFFS BY MOUTH TWICE A DAY  . triamterene-hydrochlorothiazide (MAXZIDE) 75-50 MG tablet Take 1 tablet by mouth daily.  . metoprolol tartrate (LOPRESSOR) 25 MG tablet Take 1 tablet (25 mg total) by mouth 2 (two) times daily.  Marland Kitchen zolpidem (AMBIEN) 10 MG tablet Take 1 tablet (10 mg total) by mouth at bedtime.  . [DISCONTINUED] albuterol (PROAIR HFA) 108 (90 Base) MCG/ACT inhaler INHALE 2 PUFFS BY MOUTH EVERY 6-8HRS AS NEEDED (Patient taking differently: Inhale 2 puffs into the lungs every 6 (six) hours as needed for wheezing or shortness of breath. )  . [DISCONTINUED] ipratropium-albuterol (DUONEB) 0.5-2.5 (3) MG/3ML SOLN Take 3 mLs by nebulization every 6 (six) hours as needed. (Patient taking differently: Take 3 mLs by nebulization every 6 (six) hours as needed (SOB). )  . [DISCONTINUED] predniSONE (STERAPRED UNI-PAK 21 TAB) 5 MG (21) TBPK tablet Take 1 tablet (5 mg total) by mouth as directed. Use as directed   No facility-administered encounter medications on file as of 06/24/2019.     Review of Systems  Review of Systems  Constitutional: Positive for fatigue. Negative for activity change and fever.  HENT: Negative for sinus pressure, sinus pain and sore throat.   Respiratory: Positive for shortness of breath. Negative for cough and wheezing.   Cardiovascular: Positive for leg  swelling. Negative for chest  pain and palpitations.  Gastrointestinal: Negative for diarrhea, nausea and vomiting.  Musculoskeletal: Negative for arthralgias.  Neurological: Negative for dizziness.  Psychiatric/Behavioral: Negative for sleep disturbance. The patient is not nervous/anxious.      Physical Exam  BP 120/82 (BP Location: Left Arm, Patient Position: Sitting, Cuff Size: Normal)   Pulse 87   Temp 98.4 F (36.9 C) (Temporal)   Ht '5\' 4"'$  (1.626 m)   Wt (!) 324 lb 4.8 oz (147.1 kg)   SpO2 100% Comment: on RA  BMI 55.67 kg/m   Wt Readings from Last 5 Encounters:  06/24/19 (!) 324 lb 4.8 oz (147.1 kg)  06/05/19 (!) 330 lb (149.7 kg)  05/08/19 (!) 323 lb (146.5 kg)  05/05/19 (!) 322 lb 9.6 oz (146.3 kg)  04/15/19 (!) 323 lb (146.5 kg)    BMI Readings from Last 5 Encounters:  06/24/19 55.67 kg/m  06/05/19 56.64 kg/m  05/08/19 55.44 kg/m  05/05/19 55.37 kg/m  04/15/19 55.44 kg/m     Physical Exam Vitals and nursing note reviewed.  Constitutional:      General: She is not in acute distress.    Appearance: Normal appearance.  HENT:     Head: Normocephalic and atraumatic.     Right Ear: External ear normal.     Left Ear: External ear normal.     Nose: Nose normal. No congestion.     Mouth/Throat:     Mouth: Mucous membranes are moist.     Pharynx: Oropharynx is clear.  Eyes:     Pupils: Pupils are equal, round, and reactive to light.  Cardiovascular:     Rate and Rhythm: Normal rate and regular rhythm.     Pulses: Normal pulses.     Heart sounds: Normal heart sounds. No murmur.  Pulmonary:     Breath sounds: No decreased air movement. No decreased breath sounds, wheezing or rales.  Musculoskeletal:     Cervical back: Normal range of motion.     Right lower leg: Edema present.     Left lower leg: Edema present.  Skin:    General: Skin is warm and dry.     Capillary Refill: Capillary refill takes less than 2 seconds.  Neurological:     General: No  focal deficit present.     Mental Status: She is alert and oriented to person, place, and time. Mental status is at baseline.     Gait: Gait normal.  Psychiatric:        Mood and Affect: Mood normal.        Behavior: Behavior normal.        Thought Content: Thought content normal.        Judgment: Judgment normal.       Assessment & Plan:   Chronic diastolic heart failure (HCC) Likely 6 pound weight loss Symptoms have improved  Plan: Patient seeing cardiology this week Continue to weigh yourself daily   Asthma Doubt this is true asthma History of normal spirometry in the past No audible wheeze on lung exam today  Plan: Okay to continue Symbicort this time We will order pulmonary function testing After completing pulmonary function testing patient would like to establish with the [facility] pulmonary clinic  Sleep apnea syndrome Managed by neurology  Plan: Continue CPAP therapy  DYSPNEA Her dyspnea is likely multifactorial due to BMI of 55, diastolic heart failure, continued weight gain, sedentary lifestyle.    Patient has recently lost 6 pounds She reports that her shortness of breath  has improved  Plan: Keep follow-up with cardiology We will order pulmonary function testing  Morbid obesity ([place]) Plan: Continue to work with primary care regarding your BMI Reducing your BMI and losing around 10% of your body weight would be significantly helpful with improving your breathing    Return in about 2 months (around 08/22/2019), or if symptoms worsen or fail to improve, for Follow up with Dr. Elsworth Soho, Follow up for PFT.   Lauraine Rinne, NP 06/24/2019   This appointment required 25 minutes of patient care (this includes precharting, chart review, review of results, face-to-face care, etc.).

## 2019-06-24 NOTE — Patient Instructions (Addendum)
You were seen today by Lauraine Rinne, NP  for:   1. Mild persistent asthma, unspecified whether complicated  - Pulmonary function test; Future  Continue Symbicort 160 >>> 2 puffs in the morning right when you wake up, rinse out your mouth after use, 12 hours later 2 puffs, rinse after use >>> Take this daily, no matter what >>> This is not a rescue inhaler    2. DYSPNEA  This is improved likely because you have lost some of the fluid you are retaining  We will order a breathing test to further evaluate your breathing  3. Morbid obesity ([place])  Continue to work with primary care regarding management of your weight  Goal should be to lose around 10% of your body weight around 32 pounds to help improve your breathing  Follow a low carbohydrate diet, low sugar  Work on increasing your mobility  Goals for physical activity should be around 30 minutes of activity a day  4. Chronic diastolic heart failure ([place])  Continue to work with cardiology  Keep follow-up appointment with cardiology this week  Continue to weigh yourself  Continue fluid pills as managed by cardiology  Follow a low-sodium diet  5. Obstructive sleep apnea syndrome  Continue CPAP  Continue to follow-up with neurology   We recommend today:  Orders Placed This Encounter  Procedures  . Pulmonary function test    Standing Status:   Future    Standing Expiration Date:   06/23/2020    Order Specific Question:   Where should this test be performed?    Answer:   [facility]    Order Specific Question:   Full PFT: includes the following: basic spirometry, spirometry pre & post bronchodilator, diffusion capacity (DLCO), lung volumes    Answer:   Full PFT   Orders Placed This Encounter  Procedures  . Pulmonary function test   No orders of the defined types were placed in this encounter.   Follow Up:    Return in about 2 months (around 08/22/2019), or if symptoms worsen or fail to improve, for  Follow up with Dr. Elsworth Soho, Follow up for PFT.   Please do your part to reduce the spread of COVID-19:      Reduce your risk of any infection  and COVID19 by using the similar precautions used for avoiding the common cold or flu:  Marland Kitchen Wash your hands often with soap and warm water for at least 20 seconds.  If soap and water are not readily available, use an alcohol-based hand sanitizer with at least 60% alcohol.  . If coughing or sneezing, cover your mouth and nose by coughing or sneezing into the elbow areas of your shirt or coat, into a tissue or into your sleeve (not your hands). Langley Gauss A MASK when in public  . Avoid shaking hands with others and consider head nods or verbal greetings only. . Avoid touching your eyes, nose, or mouth with unwashed hands.  . Avoid close contact with people who are sick. . Avoid places or events with large numbers of people in one location, like concerts or sporting events. . If you have some symptoms but not all symptoms, continue to monitor at home and seek medical attention if your symptoms worsen. . If you are having a medical emergency, call 911.   ADDITIONAL HEALTHCARE OPTIONS FOR PATIENTS  [facility] / e-Visit: [email or web address]         [name]: [phone]  [facility]  [facility]: [phone]                   [facility]: [id number]     It is flu season:   >>> Best ways to protect herself from the flu: Receive the yearly flu vaccine, practice good hand hygiene washing with soap and also using hand sanitizer when available, eat a nutritious meals, get adequate rest, hydrate appropriately   Please contact the office if your symptoms worsen or you have concerns that you are not improving.   Thank you for choosing [facility] for your healthcare, and for allowing [place] to partner with you on your healthcare journey. I am thankful to be able to  provide care to you today.   Wyn Quaker FNP-C

## 2019-06-24 NOTE — Assessment & Plan Note (Addendum)
Likely 6 pound weight loss Symptoms have improved  Plan: Patient seeing cardiology this week Continue to weigh yourself daily

## 2019-06-24 NOTE — Assessment & Plan Note (Signed)
Managed by neurology  Plan: Continue CPAP therapy

## 2019-06-24 NOTE — Assessment & Plan Note (Signed)
Her dyspnea is likely multifactorial due to BMI of 55, diastolic heart failure, continued weight gain, sedentary lifestyle.    Patient has recently lost 6 pounds She reports that her shortness of breath has improved  Plan: Keep follow-up with cardiology We will order pulmonary function testing

## 2019-06-25 ENCOUNTER — Ambulatory Visit (INDEPENDENT_AMBULATORY_CARE_PROVIDER_SITE_OTHER): Payer: Medicare Other | Admitting: Family Medicine

## 2019-06-25 ENCOUNTER — Encounter: Payer: Self-pay | Admitting: Family Medicine

## 2019-06-25 ENCOUNTER — Other Ambulatory Visit (HOSPITAL_COMMUNITY)
Admission: RE | Admit: 2019-06-25 | Discharge: 2019-06-25 | Disposition: A | Discharge status: Home / Self Care | Payer: Medicare Other | Source: Other Acute Inpatient Hospital | Attending: Family Medicine | Admitting: Family Medicine

## 2019-06-25 VITALS — BP 120/82 | HR 101 | Temp 97.1°F | Resp 18 | Ht 64.0 in | Wt 325.0 lb

## 2019-06-25 DIAGNOSIS — R110 Nausea: Secondary | ICD-10-CM

## 2019-06-25 DIAGNOSIS — Z0289 Encounter for other administrative examinations: Secondary | ICD-10-CM

## 2019-06-25 DIAGNOSIS — R52 Pain, unspecified: Secondary | ICD-10-CM

## 2019-06-25 DIAGNOSIS — R7401 Elevation of levels of liver transaminase levels: Secondary | ICD-10-CM

## 2019-06-25 DIAGNOSIS — Z5309 Procedure and treatment not carried out because of other contraindication: Secondary | ICD-10-CM

## 2019-06-25 DIAGNOSIS — R32 Unspecified urinary incontinence: Secondary | ICD-10-CM

## 2019-06-25 DIAGNOSIS — E1159 Type 2 diabetes mellitus with other circulatory complications: Secondary | ICD-10-CM | POA: Diagnosis not present

## 2019-06-25 DIAGNOSIS — F5104 Psychophysiologic insomnia: Secondary | ICD-10-CM

## 2019-06-25 DIAGNOSIS — R748 Abnormal levels of other serum enzymes: Secondary | ICD-10-CM | POA: Diagnosis not present

## 2019-06-25 DIAGNOSIS — E782 Mixed hyperlipidemia: Secondary | ICD-10-CM | POA: Diagnosis not present

## 2019-06-25 DIAGNOSIS — M5116 Intervertebral disc disorders with radiculopathy, lumbar region: Secondary | ICD-10-CM

## 2019-06-25 DIAGNOSIS — E559 Vitamin D deficiency, unspecified: Secondary | ICD-10-CM

## 2019-06-25 DIAGNOSIS — J4530 Mild persistent asthma, uncomplicated: Secondary | ICD-10-CM

## 2019-06-25 DIAGNOSIS — I10 Essential (primary) hypertension: Secondary | ICD-10-CM

## 2019-06-25 MED ORDER — ONDANSETRON HCL 4 MG/2ML IJ SOLN
4.0000 mg | Freq: Once | INTRAMUSCULAR | Status: AC
  Administered 2019-06-25: 4 mg via INTRAMUSCULAR

## 2019-06-25 MED ORDER — UNABLE TO FIND: 0 refills | Status: DC

## 2019-06-25 MED ORDER — CLONIDINE HCL 0.3 MG PO TABS: 0.3000 mg | ORAL_TABLET | Freq: Every day | ORAL | 1 refills | Status: DC

## 2019-06-25 MED ORDER — UNABLE TO FIND: 11 refills | Status: DC

## 2019-06-25 NOTE — Patient Instructions (Addendum)
F/U in office with MD in 13 weeks, call if you need me sooner  Zofran 4 mg iM in office today for nausea  Lipid, cmp and eGFR, TSH, cBC and vit D today  Please send urine for microalb also if possible Please use Cpap machine every day.  Continue to increase vegetable intake , reduce sweets and starchy foods

## 2019-06-26 ENCOUNTER — Other Ambulatory Visit: Payer: Self-pay

## 2019-06-26 ENCOUNTER — Encounter: Payer: Self-pay | Admitting: Internal Medicine

## 2019-06-26 ENCOUNTER — Ambulatory Visit (INDEPENDENT_AMBULATORY_CARE_PROVIDER_SITE_OTHER): Payer: Medicare Other | Admitting: Internal Medicine

## 2019-06-26 VITALS — BP 154/98 | HR 109 | Temp 98.6°F | Ht 64.0 in | Wt 323.2 lb

## 2019-06-26 DIAGNOSIS — R0600 Dyspnea, unspecified: Secondary | ICD-10-CM | POA: Diagnosis not present

## 2019-06-26 DIAGNOSIS — R0609 Other forms of dyspnea: Secondary | ICD-10-CM

## 2019-06-26 DIAGNOSIS — I5031 Acute diastolic (congestive) heart failure: Secondary | ICD-10-CM

## 2019-06-26 MED ORDER — ERGOCALCIFEROL 1.25 MG (50000 UT) PO CAPS: 50000.0000 [IU] | ORAL_CAPSULE | ORAL | 3 refills | Status: DC

## 2019-06-26 NOTE — Progress Notes (Signed)
Cardiology Office Note:    Date:  06/26/2019   ID:  Angela [name], DOB 1960-08-21, MRN [id number]  PCP:  Fayrene Helper, MD  Cardiologist:  Elouise Munroe, MD  Electrophysiologist:  None   Referring MD: Fayrene Helper, MD   Chief Complaint: DOE  History of Present Illness:    Angela [name] is a 59 y.o. female with a history of HTN, HLD, and diabetes mellitus who presents today for follow-up of coughing and shortness of breath with orthopnea.    She has been taking half a tab of her diuretic daily (triamterene HCTZ 75-50 mg). She feels this is helping slightly, but continues to have NYHA class III symptoms. She is hoping to look into oxygen therapy. Recently visited with Pulmonary medicine for her multifactorial dyspnea. Felt not to be related to reactive airway disease, however repeat PFTs are planned.   She struggles with daily diuretic due to lower extremity cramps.   Past Medical History:  Diagnosis Date  . Anxiety   . Arthritis   . Asthma   . Coronary atherosclerosis of native coronary artery    Mild nonobstructive CAD by cardiac catheterization 2008 - Dr. Terrence Dupont  . Degenerative disc disease   . Depression   . Dysrhythmia   . Essential hypertension, benign   . Folliculitis 6/60/6301  . GERD (gastroesophageal reflux disease)   . H/O hiatal hernia   . Helicobacter pylori gastritis 2008  . Hypercholesterolemia   . Hypertension   . Hyperthyroidism    s/p radiation  . Left-sided epistaxis 05/15/2018  . Low back pain   . Migraine   . Nephrolithiasis    Recurring episodes since 2004  . Severe obesity (BMI >= 40) (HCC)   . Sleep apnea    STOP BANG SCORE 6no cpap used  . Type 2 diabetes mellitus with diabetic neuropathy [facility])     Past Surgical History:  Procedure Laterality Date  . ABDOMINAL HYSTERECTOMY    . BACK SURGERY  1993  . BACK SURGERY  1994  . BACK SURGERY  2002  . BACK SURGERY  2011   [place] Left   .  CHOLECYSTECTOMY  1996  . COLONOSCOPY  2000 [facility]   INT HEMORRHOIDS/FISSURE  . COLONOSCOPY  2003 [facility], CHANGE IN BOWEL HABITS   INT HEMORRHOIDS  . COLONOSCOPY  2006 [facility]   INT HEMORRHOIDS  . COLONOSCOPY  2007 [facility]   INT HEMORRHOIDS  . CYSTOSCOPY WITH STENT PLACEMENT Left 07/25/2012   Procedure: CYSTOSCOPY, left retograde pyelogram WITH left ureteral  STENT PLACEMENT;  Surgeon: Claybon Jabs, MD;  Location: [facility];  Service: Urology;  Laterality: Left;  . CYSTOSCOPY/RETROGRADE/URETEROSCOPY  12/04/2011   Procedure: CYSTOSCOPY/RETROGRADE/URETEROSCOPY;  Surgeon: Molli Hazard, MD;  Location: [facility];  Service: Urology;  Laterality: Right;  . CYSTOSCOPY/RETROGRADE/URETEROSCOPY/STONE EXTRACTION WITH BASKET Left 09/03/2012   Procedure: CYSTOSCOPY/RETROGRADE pyelogram/digital URETEROSCOPY/STONE EXTRACTION WITH BASKET, left stent removal;  Surgeon: Molli Hazard, MD;  Location: [facility];  Service: Urology;  Laterality: Left;  . ESOPHAGOGASTRODUODENOSCOPY  2008   [facility]:UXNAT hiatal hernia./Normal esophagus without evidence of Barrett's mass, stricture, erosion or ulceration./Normal duodenal bulb and second portion of the duodenum./Diffuse erythema in the body and the antrum with occasional erosion.  Biopsies obtained via cold forceps to evaluate for H. pylori gastritis  . FRACTURE SURGERY  1999   right clavicle  . HOLMIUM LASER APPLICATION Left 09/02/7320   Procedure: HOLMIUM LASER APPLICATION;  Surgeon: Molli Hazard, MD;  Location: [facility];  Service: Urology;  Laterality: Left;  . KNEE ARTHROSCOPY  10/04/2010,    right knee arthroscopy, dr Theda Sers  . LEFT HEART CATH AND CORONARY ANGIOGRAPHY N/A 05/08/2018   Procedure: LEFT HEART CATH AND CORONARY ANGIOGRAPHY;  Surgeon: Troy Sine, MD;  Location: [facility];  Service: Cardiovascular;  Laterality: N/A;  . left knee arthroscopic surgery  1999  . Left salphingectomy secondary to ectopic pregnancy  1991  . PARTIAL  HYSTERECTOMY  1991  . rotary cuff  Right 05/14/2014   [facility]  . UPPER GASTROINTESTINAL ENDOSCOPY  2008 abd pain   H. pylori gastritis  . UPPER GASTROINTESTINAL ENDOSCOPY  1996 [place]   PUD    Current Medications: Current Meds  Medication Sig  . albuterol (VENTOLIN HFA) 108 (90 Base) MCG/ACT inhaler Inhale 2 puffs into the lungs every 6 (six) hours as needed for wheezing or shortness of breath.  Marland Kitchen amLODipine (NORVASC) 10 MG tablet Take 1 tablet (10 mg total) by mouth daily.  . B-D UF III MINI PEN NEEDLES 31G X 5 MM MISC USE ONCE DAILY AS DIRECTED  . blood glucose meter kit and supplies Dispense based on patient and insurance preference. Once daily testing dx. e11.9  . calcipotriene (DOVONOX) 0.005 % ointment APPLY TO AFFECTED AREA OF LEGS AND ELBOWS DAILY  . cloNIDine (CATAPRES - DOSED IN MG/24 HR) 0.3 mg/24hr patch APPLY 1 PATCH TO SKIN ONCE A WEEK  . cloNIDine (CATAPRES) 0.3 MG tablet Take 1 tablet (0.3 mg total) by mouth at bedtime.  . cyclobenzaprine (FLEXERIL) 10 MG tablet TAKE 1 TABLET BY MOUTH THREE TIMES A DAY  . diazepam (VALIUM) 5 MG tablet Take one tablet by mouth three times daily for back spasm  . DULoxetine (CYMBALTA) 60 MG capsule TAKE 1 CAPSULE BY MOUTH TWICE A DAY (Patient taking differently: Take 60 mg by mouth 2 (two) times daily. )  . ergocalciferol (VITAMIN D2) 1.25 MG (50000 UT) capsule Take 1 capsule (50,000 Units total) by mouth once a week. One capsule once weekly  . esomeprazole (NEXIUM) 40 MG capsule TAKE 1 CAPSULE BY MOUTH EVERY DAY  . fluconazole (DIFLUCAN) 150 MG tablet Take one tablet by mouth once daily, as needed, for vaginal itch associated with whig dose prednisone and diabetes  . fluticasone (FLONASE) 50 MCG/ACT nasal spray Place 2 sprays into both nostrils daily. (Patient taking differently: Place 2 sprays into both nostrils daily as needed for allergies. )  . gabapentin (NEURONTIN) 800 MG tablet TAKE 1 TABLET BY MOUTH FOUR TIMES A DAY  . glipiZIDE  (GLUCOTROL) 10 MG tablet Take 10 mg by mouth daily before breakfast.  . hydrALAZINE (APRESOLINE) 25 MG tablet Take by mouth.  . hydrOXYzine (ATARAX/VISTARIL) 10 MG tablet Take 10-30 mg by mouth at bedtime.   . Insulin Glargine (LANTUS SOLOSTAR) 100 UNIT/ML Solostar Pen Inject 80 Units into the skin at bedtime.  Marland Kitchen ipratropium-albuterol (DUONEB) 0.5-2.5 (3) MG/3ML SOLN Take 3 mLs by nebulization every 6 (six) hours as needed.  Marland Kitchen KLOR-CON M20 20 MEQ tablet TAKE 1 TABLET BY MOUTH TWICE A DAY  . Lancets (ONETOUCH DELICA PLUS [id number]) MISC TEST ONCE DAILY  . levothyroxine (SYNTHROID) 50 MCG tablet TAKE 1 TABLET BY MOUTH DAILY BEFORE BREAKFAST  . loratadine (CLARITIN) 10 MG tablet Take 1 tablet (10 mg total) by mouth daily.  . Magnesium Oxide 400 MG CAPS Take one  . meclizine (ANTIVERT) 25 MG tablet Take 25 mg by mouth 3 (three) times daily  as needed for dizziness.  . metFORMIN (GLUCOPHAGE) 1000 MG tablet Take 1 tablet (1,000 mg total) by mouth 2 (two) times daily.  . montelukast (SINGULAIR) 10 MG tablet TAKE 1 TABLET (10 MG TOTAL) BY MOUTH AT BEDTIME.  . nitroGLYCERIN (NITROSTAT) 0.4 MG SL tablet Place 1 tablet (0.4 mg total) under the tongue every 5 (five) minutes as needed for chest pain.  Marland Kitchen ondansetron (ZOFRAN) 4 MG tablet Take 1 tablet (4 mg total) by mouth every 8 (eight) hours as needed for nausea or vomiting.  Glory Rosebush VERIO test strip USE AS INSTRUCTED FOUR TIMES DAILY TESTING DX E11.65  . oxyCODONE (OXYCONTIN) 40 mg 12 hr tablet Take 1 tablet (40 mg total) by mouth every 12 (twelve) hours.  . SYMBICORT 160-4.5 MCG/ACT inhaler TAKE 2 PUFFS BY MOUTH TWICE A DAY  . triamterene-hydrochlorothiazide (MAXZIDE) 75-50 MG tablet Take 1 tablet by mouth daily.  Marland Kitchen UNABLE TO FIND Glucerna 114 U/M or PRN Chux 90/M or PRN Incontinence pads 198/M or PRN  Tub Mat x 1  Grab bars  . UNABLE TO FIND Reacher/Grabber x 1  Lift chair x 1  Lifeline X1     Allergies:   Morphine, Ace inhibitors, Aspirin,  Losartan, Tapentadol, Tomato, and Adhesive [tape]   Social History   Socioeconomic History  . Marital status: Single    Spouse name: Not on file  . Number of children: 1  . Years of education: 96  . Highest education level: 12th grade  Occupational History  . Occupation: Disable   Tobacco Use  . Smoking status: Never Smoker  . Smokeless tobacco: Never Used  Substance and Sexual Activity  . Alcohol use: No  . Drug use: No  . Sexual activity: Not Currently    Partners: Male    Birth control/protection: Surgical    Comment: hyst  Other Topics Concern  . Not on file  Social History Narrative   Lives alone    Social Determinants of Health   Financial Resource Strain:   . Difficulty of Paying Living Expenses: Not on file  Food Insecurity:   . Worried About Charity fundraiser in the Last Year: Not on file  . Ran Out of Food in the Last Year: Not on file  Transportation Needs:   . Lack of Transportation (Medical): Not on file  . Lack of Transportation (Non-Medical): Not on file  Physical Activity:   . Days of Exercise per Week: Not on file  . Minutes of Exercise per Session: Not on file  Stress:   . Feeling of Stress : Not on file  Social Connections:   . Frequency of Communication with Friends and Family: Not on file  . Frequency of Social Gatherings with Friends and Family: Not on file  . Attends Religious Services: Not on file  . Active Member of Clubs or Organizations: Not on file  . Attends Archivist Meetings: Not on file  . Marital Status: Not on file     Family History: The patient's family history includes Asthma in her mother; Cancer in her father and mother; Colon cancer in her father; Heart attack in her father; Heart disease in her mother; Hypertension in her mother. There is no history of Colon polyps.  ROS:   Please see the history of present illness.    All other systems reviewed and are negative.  EKGs/Labs/Other Studies Reviewed:    The  following studies were reviewed today:  EKG:  Not performed today  Recent  Labs: 06/05/2019: NT-Pro BNP 194 06/25/2019: ALT 138; BUN 20; Creat 1.28; Hemoglobin 12.8; Platelets 427; Potassium 4.5; Sodium 134; TSH 1.75  Recent Lipid Panel    Component Value Date/Time   CHOL 91 06/25/2019 1225   TRIG 115 06/25/2019 1225   HDL 46 (L) 06/25/2019 1225   CHOLHDL 2.0 06/25/2019 1225   VLDL 16 05/06/2018 0335   LDLCALC 25 06/25/2019 1225    Physical Exam:    VS:  BP (!) 154/98   Pulse (!) 109   Temp 98.6 F (37 C)   Ht 5' 4" (1.626 m)   Wt (!) 323 lb 3.2 oz (146.6 kg)   SpO2 95%   BMI 55.48 kg/m     Wt Readings from Last 5 Encounters:  06/26/19 (!) 323 lb 3.2 oz (146.6 kg)  06/25/19 (!) 325 lb (147.4 kg)  06/24/19 (!) 324 lb 4.8 oz (147.1 kg)  06/05/19 (!) 330 lb (149.7 kg)  05/08/19 (!) 323 lb (146.5 kg)     Constitutional: No acute distress Eyes: sclera non-icteric, normal conjunctiva and lids ENMT: normal dentition, moist mucous membranes Cardiovascular: regular rhythm, normal rate, no murmurs. S1 and S2 normal. Radial pulses normal bilaterally. No jugular venous distention.  Respiratory: clear to auscultation bilaterally GI : normal bowel sounds, soft and nontender. Obese abdomen. MSK: extremities warm, well perfused. Diffuse bilateral edema.  NEURO: grossly nonfocal exam, moves all extremities. PSYCH: alert and oriented x 3, normal mood and affect.   ASSESSMENT:    1. DOE (dyspnea on exertion)   2. Acute diastolic heart failure (HCC)    PLAN:    DOE and acute on chronic diastolic HF - Volume status challening to assess. Recommend RHC to assess for pulmonary HTN and PCWP. Update echo for any other causes of continued HF symptoms.    INFORMED CONSENT: I have reviewed the risks, indications, and alternatives to right heart catheterization with the patient. Risks include but are not limited to bleeding, infection, vascular injury, stroke, myocardial infection,  arrhythmia, radiation-related injury in the case of prolonged fluoroscopy use, emergency cardiac surgery, and death. The patient understands the risks of serious complication is 1-2 in 1000 with diagnostic right heart cath.  Hypertension-stable on current regimen of amlodipine 10 mg daily, clonidine patch and clonidine tab, metoprolol tartrate 25 mg twice daily, and triamterene HCTZ 37.5- 25 mg.   Total time of encounter: 30 minutes total time of encounter, including 25 minutes spent in face-to-face patient care. This time includes coordination of care and counseling regarding above mentioned problem list. Remainder of non-face-to-face time involved reviewing chart documents/testing relevant to the patient encounter and documentation in the medical record. I have independently reviewed documentation from referring provider.    , MD [name]  [facility]    Medication Adjustments/Labs and Tests Ordered: Current medicines are reviewed at length with the patient today.  Concerns regarding medicines are outlined above.  No orders of the defined types were placed in this encounter.  No orders of the defined types were placed in this encounter.   There are no Patient Instructions on file for this visit.   

## 2019-06-26 NOTE — Patient Instructions (Addendum)
Medication Instructions:  No changes  *If you need a refill on your cardiac medications before your next appointment, please call your pharmacy*  Lab Work: CBC BMP COVID test all on March 4,2021   Testing/Procedures: Will be schedule at [place] lab- March 8,2021 Your physician has requested that you have a  Right heart cardiac catheterization. Cardiac catheterization is used to diagnose and/or treat various heart conditions. Doctors may recommend this procedure for a number of different reasons. The most common reason is to evaluate chest pain. Chest pain can be a symptom of coronary artery disease (CAD), and cardiac catheterization can show whether plaque is narrowing or blocking your heart's arteries. This procedure is also used to evaluate the valves, as well as measure the blood flow and oxygen levels in different parts of your heart. For further information please visit [email or web address]. Please follow instruction sheet, as given.  And   will be schedule at [place] has requested that you have an echocardiogram. Echocardiography is a painless test that uses sound waves to create images of your heart. It provides your doctor with information about the size and shape of your heart and how well your heart's chambers and valves are working. This procedure takes approximately one hour. There are no restrictions for this procedure.     Follow-Up: At [facility], you and your health needs are our priority.  As part of our continuing mission to provide you with exceptional heart care, we have created designated Provider Care Teams.  These Care Teams include your primary Cardiologist (physician) and Advanced Practice Providers (APPs -  Physician Assistants and Nurse Practitioners) who all work together to provide you with the care you need, when you need it.  Your next appointment:   4 week(s)  The format for your next appointment:     In Person  Provider:   Cherlynn Kaiser, MD  Other Instructions N/a      [place] [address] Dept: [phone] Loc: [place]  06/26/2019  You are scheduled for a Cardiac Catheterization on Monday, March 8 with Dr. Glenetta Hew.  1. Please arrive at the [facility] (Main Entrance A) at [facility]: [address] [place], [place] [address] at 7:00 AM (This time is two hours before your procedure to ensure your preparation). Free valet parking service is available.   Special note: Every effort is made to have your procedure done on time. Please understand that emergencies sometimes delay scheduled procedures.  2. Diet: Do not eat solid foods after midnight.  The patient may have clear liquids until 5am upon the day of the procedure.  3. Labs: You will need to have blood drawn on CBC, BMP, March 4 at  [place] , [place]      Also needs Covid test  At 3 pm   [place]   3 days prior for testing then  3 days of quarantine until cardiac cathetheterization. You do not need to be fasting.  4. Medication instructions in preparation for your procedure:  do not take metformin the day of procedure and 48 hours after the procedure. Do not take Gluctorol the morning of procedure. On the morning of your procedure, take your   morning medicines NOT listed above.  You may use sips of water.  5. Plan for one night stay--bring personal belongings. 6. Bring a  current list of your medications and current insurance cards. 7. You MUST have a responsible person to drive you home. 8. Someone MUST be with you the first 24 hours after you arrive home or your discharge will be delayed. 9. Please wear clothes that are easy to get on and off and wear slip-on shoes.  Thank you for allowing [place] to care for you!   -- [facility]

## 2019-06-26 NOTE — H&P (View-Only) (Signed)
Cardiology Office Note:    Date:  06/26/2019   ID:  Angela [name], DOB 09/22/1960, MRN [id number]  PCP:  Simpson, Margaret E, MD  Cardiologist:  Zyaire Mccleod A Angeliz Settlemyre, MD  Electrophysiologist:  None   Referring MD: Simpson, Margaret E, MD   Chief Complaint: DOE  History of Present Illness:    Angela [name] is a 59 y.o. female with a history of HTN, HLD, and diabetes mellitus who presents today for follow-up of coughing and shortness of breath with orthopnea.    She has been taking half a tab of her diuretic daily (triamterene HCTZ 75-50 mg). She feels this is helping slightly, but continues to have NYHA class III symptoms. She is hoping to look into oxygen therapy. Recently visited with Pulmonary medicine for her multifactorial dyspnea. Felt not to be related to reactive airway disease, however repeat PFTs are planned.   She struggles with daily diuretic due to lower extremity cramps.   Past Medical History:  Diagnosis Date  . Anxiety   . Arthritis   . Asthma   . Coronary atherosclerosis of native coronary artery    Mild nonobstructive CAD by cardiac catheterization 2008 - Dr. Harwani  . Degenerative disc disease   . Depression   . Dysrhythmia   . Essential hypertension, benign   . Folliculitis 01/21/2015  . GERD (gastroesophageal reflux disease)   . H/O hiatal hernia   . Helicobacter pylori gastritis 2008  . Hypercholesterolemia   . Hypertension   . Hyperthyroidism    s/p radiation  . Left-sided epistaxis 05/15/2018  . Low back pain   . Migraine   . Nephrolithiasis    Recurring episodes since 2004  . Severe obesity (BMI >= 40) (HCC)   . Sleep apnea    STOP BANG SCORE 6no cpap used  . Type 2 diabetes mellitus with diabetic neuropathy (HCC)     Past Surgical History:  Procedure Laterality Date  . ABDOMINAL HYSTERECTOMY    . BACK SURGERY  1993  . BACK SURGERY  1994  . BACK SURGERY  2002  . BACK SURGERY  2011   [facility]   . CARPAL TUNNEL RELEASE Left   .  CHOLECYSTECTOMY  1996  . COLONOSCOPY  2000 [facility]   INT HEMORRHOIDS/FISSURE  . COLONOSCOPY  2003 [facility], CHANGE IN BOWEL HABITS   INT HEMORRHOIDS  . COLONOSCOPY  2006 [facility]   INT HEMORRHOIDS  . COLONOSCOPY  2007 [facility]   INT HEMORRHOIDS  . CYSTOSCOPY WITH STENT PLACEMENT Left 07/25/2012   Procedure: CYSTOSCOPY, left retograde pyelogram WITH left ureteral  STENT PLACEMENT;  Surgeon: Mark C Ottelin, MD;  Location: [facility];  Service: Urology;  Laterality: Left;  . CYSTOSCOPY/RETROGRADE/URETEROSCOPY  12/04/2011   Procedure: CYSTOSCOPY/RETROGRADE/URETEROSCOPY;  Surgeon: Daniel Young Woodruff, MD;  Location: [facility];  Service: Urology;  Laterality: Right;  . CYSTOSCOPY/RETROGRADE/URETEROSCOPY/STONE EXTRACTION WITH BASKET Left 09/03/2012   Procedure: CYSTOSCOPY/RETROGRADE pyelogram/digital URETEROSCOPY/STONE EXTRACTION WITH BASKET, left stent removal;  Surgeon: Daniel Young Woodruff, MD;  Location: [facility];  Service: Urology;  Laterality: Left;  . ESOPHAGOGASTRODUODENOSCOPY  2008   [facility]:Small hiatal hernia./Normal esophagus without evidence of Barrett's mass, stricture, erosion or ulceration./Normal duodenal bulb and second portion of the duodenum./Diffuse erythema in the body and the antrum with occasional erosion.  Biopsies obtained via cold forceps to evaluate for H. pylori gastritis  . FRACTURE SURGERY  1999   right clavicle  . HOLMIUM LASER APPLICATION Left 09/03/2012   Procedure: HOLMIUM LASER APPLICATION;  Surgeon: Daniel Young Woodruff, MD;    Location: [facility];  Service: Urology;  Laterality: Left;  . KNEE ARTHROSCOPY  10/04/2010,    right knee arthroscopy, dr Theda Sers  . LEFT HEART CATH AND CORONARY ANGIOGRAPHY N/A 05/08/2018   Procedure: LEFT HEART CATH AND CORONARY ANGIOGRAPHY;  Surgeon: Troy Sine, MD;  Location: [facility];  Service: Cardiovascular;  Laterality: N/A;  . left knee arthroscopic surgery  1999  . Left salphingectomy secondary to ectopic pregnancy  1991  . PARTIAL  HYSTERECTOMY  1991  . rotary cuff  Right 05/14/2014   [facility]  . UPPER GASTROINTESTINAL ENDOSCOPY  2008 abd pain   H. pylori gastritis  . UPPER GASTROINTESTINAL ENDOSCOPY  1996 [facility] [place]   [facility]    Current Medications: Current Meds  Medication Sig  . albuterol (VENTOLIN HFA) 108 (90 Base) MCG/ACT inhaler Inhale 2 puffs into the lungs every 6 (six) hours as needed for wheezing or shortness of breath.  Marland Kitchen amLODipine (NORVASC) 10 MG tablet Take 1 tablet (10 mg total) by mouth daily.  . B-D UF III MINI PEN NEEDLES 31G X 5 MM MISC USE ONCE DAILY AS DIRECTED  . blood glucose meter kit and supplies Dispense based on patient and insurance preference. Once daily testing dx. e11.9  . calcipotriene (DOVONOX) 0.005 % ointment APPLY TO AFFECTED AREA OF LEGS AND ELBOWS DAILY  . cloNIDine (CATAPRES - DOSED IN MG/24 HR) 0.3 mg/24hr patch APPLY 1 PATCH TO SKIN ONCE A WEEK  . cloNIDine (CATAPRES) 0.3 MG tablet Take 1 tablet (0.3 mg total) by mouth at bedtime.  . cyclobenzaprine (FLEXERIL) 10 MG tablet TAKE 1 TABLET BY MOUTH THREE TIMES A DAY  . diazepam (VALIUM) 5 MG tablet Take one tablet by mouth three times daily for back spasm  . DULoxetine (CYMBALTA) 60 MG capsule TAKE 1 CAPSULE BY MOUTH TWICE A DAY (Patient taking differently: Take 60 mg by mouth 2 (two) times daily. )  . ergocalciferol (VITAMIN D2) 1.25 MG (50000 UT) capsule Take 1 capsule (50,000 Units total) by mouth once a week. One capsule once weekly  . esomeprazole (NEXIUM) 40 MG capsule TAKE 1 CAPSULE BY MOUTH EVERY DAY  . fluconazole (DIFLUCAN) 150 MG tablet Take one tablet by mouth once daily, as needed, for vaginal itch associated with whig dose prednisone and diabetes  . fluticasone (FLONASE) 50 MCG/ACT nasal spray Place 2 sprays into both nostrils daily. (Patient taking differently: Place 2 sprays into both nostrils daily as needed for allergies. )  . gabapentin (NEURONTIN) 800 MG tablet TAKE 1 TABLET BY MOUTH FOUR TIMES A DAY  . glipiZIDE  (GLUCOTROL) 10 MG tablet Take 10 mg by mouth daily before breakfast.  . hydrALAZINE (APRESOLINE) 25 MG tablet Take by mouth.  . hydrOXYzine (ATARAX/VISTARIL) 10 MG tablet Take 10-30 mg by mouth at bedtime.   . Insulin Glargine (LANTUS SOLOSTAR) 100 UNIT/ML Solostar Pen Inject 80 Units into the skin at bedtime.  Marland Kitchen ipratropium-albuterol (DUONEB) 0.5-2.5 (3) MG/3ML SOLN Take 3 mLs by nebulization every 6 (six) hours as needed.  Marland Kitchen KLOR-CON M20 20 MEQ tablet TAKE 1 TABLET BY MOUTH TWICE A DAY  . Lancets (ONETOUCH DELICA PLUS [id number]) MISC TEST ONCE DAILY  . levothyroxine (SYNTHROID) 50 MCG tablet TAKE 1 TABLET BY MOUTH DAILY BEFORE BREAKFAST  . loratadine (CLARITIN) 10 MG tablet Take 1 tablet (10 mg total) by mouth daily.  . Magnesium Oxide 400 MG CAPS Take one  . meclizine (ANTIVERT) 25 MG tablet Take 25 mg by mouth 3 (three) times daily  as needed for dizziness.  . metFORMIN (GLUCOPHAGE) 1000 MG tablet Take 1 tablet (1,000 mg total) by mouth 2 (two) times daily.  . montelukast (SINGULAIR) 10 MG tablet TAKE 1 TABLET (10 MG TOTAL) BY MOUTH AT BEDTIME.  . nitroGLYCERIN (NITROSTAT) 0.4 MG SL tablet Place 1 tablet (0.4 mg total) under the tongue every 5 (five) minutes as needed for chest pain.  Marland Kitchen ondansetron (ZOFRAN) 4 MG tablet Take 1 tablet (4 mg total) by mouth every 8 (eight) hours as needed for nausea or vomiting.  Glory Rosebush VERIO test strip USE AS INSTRUCTED FOUR TIMES DAILY TESTING DX E11.65  . oxyCODONE (OXYCONTIN) 40 mg 12 hr tablet Take 1 tablet (40 mg total) by mouth every 12 (twelve) hours.  . SYMBICORT 160-4.5 MCG/ACT inhaler TAKE 2 PUFFS BY MOUTH TWICE A DAY  . triamterene-hydrochlorothiazide (MAXZIDE) 75-50 MG tablet Take 1 tablet by mouth daily.  Marland Kitchen UNABLE TO FIND Glucerna 114 U/M or PRN Chux 90/M or PRN Incontinence pads 198/M or PRN  Tub Mat x 1  Grab bars  . UNABLE TO FIND Reacher/Grabber x 1  Lift chair x 1  Lifeline X1     Allergies:   Morphine, Ace inhibitors, Aspirin,  Losartan, Tapentadol, Tomato, and Adhesive [tape]   Social History   Socioeconomic History  . Marital status: Single    Spouse name: Not on file  . Number of children: 1  . Years of education: 51  . Highest education level: 12th grade  Occupational History  . Occupation: Disable   Tobacco Use  . Smoking status: Never Smoker  . Smokeless tobacco: Never Used  Substance and Sexual Activity  . Alcohol use: No  . Drug use: No  . Sexual activity: Not Currently    Partners: Male    Birth control/protection: Surgical    Comment: hyst  Other Topics Concern  . Not on file  Social History Narrative   Lives alone    Social Determinants of Health   Financial Resource Strain:   . Difficulty of Paying Living Expenses: Not on file  Food Insecurity:   . Worried About Charity fundraiser in the Last Year: Not on file  . Ran Out of Food in the Last Year: Not on file  Transportation Needs:   . Lack of Transportation (Medical): Not on file  . Lack of Transportation (Non-Medical): Not on file  Physical Activity:   . Days of Exercise per Week: Not on file  . Minutes of Exercise per Session: Not on file  Stress:   . Feeling of Stress : Not on file  Social Connections:   . Frequency of Communication with Friends and Family: Not on file  . Frequency of Social Gatherings with Friends and Family: Not on file  . Attends Religious Services: Not on file  . Active Member of Clubs or Organizations: Not on file  . Attends Archivist Meetings: Not on file  . Marital Status: Not on file     Family History: The patient's family history includes Asthma in her mother; Cancer in her father and mother; Colon cancer in her father; Heart attack in her father; Heart disease in her mother; Hypertension in her mother. There is no history of Colon polyps.  ROS:   Please see the history of present illness.    All other systems reviewed and are negative.  EKGs/Labs/Other Studies Reviewed:    The  following studies were reviewed today:  EKG:  Not performed today  Recent  Labs: 06/05/2019: NT-Pro BNP 194 06/25/2019: ALT 138; BUN 20; Creat 1.28; Hemoglobin 12.8; Platelets 427; Potassium 4.5; Sodium 134; TSH 1.75  Recent Lipid Panel    Component Value Date/Time   CHOL 91 06/25/2019 1225   TRIG 115 06/25/2019 1225   HDL 46 (L) 06/25/2019 1225   CHOLHDL 2.0 06/25/2019 1225   VLDL 16 05/06/2018 0335   LDLCALC 25 06/25/2019 1225    Physical Exam:    VS:  BP (!) 154/98   Pulse (!) 109   Temp 98.6 F (37 C)   Ht 5' 4" (1.626 m)   Wt (!) 323 lb 3.2 oz (146.6 kg)   SpO2 95%   BMI 55.48 kg/m     Wt Readings from Last 5 Encounters:  06/26/19 (!) 323 lb 3.2 oz (146.6 kg)  06/25/19 (!) 325 lb (147.4 kg)  06/24/19 (!) 324 lb 4.8 oz (147.1 kg)  06/05/19 (!) 330 lb (149.7 kg)  05/08/19 (!) 323 lb (146.5 kg)     Constitutional: No acute distress Eyes: sclera non-icteric, normal conjunctiva and lids ENMT: normal dentition, moist mucous membranes Cardiovascular: regular rhythm, normal rate, no murmurs. S1 and S2 normal. Radial pulses normal bilaterally. No jugular venous distention.  Respiratory: clear to auscultation bilaterally GI : normal bowel sounds, soft and nontender. Obese abdomen. MSK: extremities warm, well perfused. Diffuse bilateral edema.  NEURO: grossly nonfocal exam, moves all extremities. PSYCH: alert and oriented x 3, normal mood and affect.   ASSESSMENT:    1. DOE (dyspnea on exertion)   2. Acute diastolic heart failure (HCC)    PLAN:    DOE and acute on chronic diastolic HF - Volume status challening to assess. Recommend RHC to assess for pulmonary HTN and PCWP. Update echo for any other causes of continued HF symptoms.    INFORMED CONSENT: I have reviewed the risks, indications, and alternatives to right heart catheterization with the patient. Risks include but are not limited to bleeding, infection, vascular injury, stroke, myocardial infection,  arrhythmia, radiation-related injury in the case of prolonged fluoroscopy use, emergency cardiac surgery, and death. The patient understands the risks of serious complication is 1-2 in 1000 with diagnostic right heart cath.  Hypertension-stable on current regimen of amlodipine 10 mg daily, clonidine patch and clonidine tab, metoprolol tartrate 25 mg twice daily, and triamterene HCTZ 37.5- 25 mg.   Total time of encounter: 30 minutes total time of encounter, including 25 minutes spent in face-to-face patient care. This time includes coordination of care and counseling regarding above mentioned problem list. Remainder of non-face-to-face time involved reviewing chart documents/testing relevant to the patient encounter and documentation in the medical record. I have independently reviewed documentation from referring provider.   Mynor Witkop, MD [place]  [facility]    Medication Adjustments/Labs and Tests Ordered: Current medicines are reviewed at length with the patient today.  Concerns regarding medicines are outlined above.  No orders of the defined types were placed in this encounter.  No orders of the defined types were placed in this encounter.   There are no Patient Instructions on file for this visit.   

## 2019-06-27 LAB — CBC
HCT: 40.5 % (ref 35.0–45.0)
Hemoglobin: 12.8 g/dL (ref 11.7–15.5)
MCH: 24.8 pg — ABNORMAL LOW (ref 27.0–33.0)
MCHC: 31.6 g/dL — ABNORMAL LOW (ref 32.0–36.0)
MCV: 78.5 fL — ABNORMAL LOW (ref 80.0–100.0)
MPV: 9.5 fL (ref 7.5–12.5)
Platelets: 427 10*3/uL — ABNORMAL HIGH (ref 140–400)
RBC: 5.16 10*6/uL — ABNORMAL HIGH (ref 3.80–5.10)
RDW: 13.1 % (ref 11.0–15.0)
WBC: 6.2 10*3/uL (ref 3.8–10.8)

## 2019-06-27 LAB — COMPLETE METABOLIC PANEL WITH GFR
AG Ratio: 1.2 (calc) (ref 1.0–2.5)
ALT: 138 U/L — ABNORMAL HIGH (ref 6–29)
AST: 274 U/L — ABNORMAL HIGH (ref 10–35)
Albumin: 4.6 g/dL (ref 3.6–5.1)
Alkaline phosphatase (APISO): 130 U/L (ref 37–153)
BUN/Creatinine Ratio: 16 (calc) (ref 6–22)
BUN: 20 mg/dL (ref 7–25)
CO2: 27 mmol/L (ref 20–32)
Calcium: 10.4 mg/dL (ref 8.6–10.4)
Chloride: 94 mmol/L — ABNORMAL LOW (ref 98–110)
Creat: 1.28 mg/dL — ABNORMAL HIGH (ref 0.50–1.05)
GFR, Est African American: 53 mL/min/{1.73_m2} — ABNORMAL LOW (ref >=60)
GFR, Est Non African American: 46 mL/min/{1.73_m2} — ABNORMAL LOW (ref >=60)
Globulin: 3.8 g/dL (calc) — ABNORMAL HIGH (ref 1.9–3.7)
Glucose, Bld: 360 mg/dL — ABNORMAL HIGH (ref 65–99)
Potassium: 4.5 mmol/L (ref 3.5–5.3)
Sodium: 134 mmol/L — ABNORMAL LOW (ref 135–146)
Total Bilirubin: 0.5 mg/dL (ref 0.2–1.2)
Total Protein: 8.4 g/dL — ABNORMAL HIGH (ref 6.1–8.1)

## 2019-06-27 LAB — LIPID PANEL
Cholesterol: 91 mg/dL (ref <200)
HDL: 46 mg/dL — ABNORMAL LOW (ref >=50)
LDL Cholesterol (Calc): 25 mg/dL (calc)
Non-HDL Cholesterol (Calc): 45 mg/dL (calc) (ref <130)
Total CHOL/HDL Ratio: 2 (calc) (ref <5.0)
Triglycerides: 115 mg/dL (ref <150)

## 2019-06-27 LAB — HEPATITIS PANEL, ACUTE
Hep A IgM: NONREACTIVE
Hep B C IgM: NONREACTIVE
Hepatitis B Surface Ag: NONREACTIVE
Hepatitis C Ab: NONREACTIVE
SIGNAL TO CUT-OFF: 0.02 (ref <1.00)

## 2019-06-27 LAB — TEST AUTHORIZATION

## 2019-06-27 LAB — TSH: TSH: 1.75 mIU/L (ref 0.40–4.50)

## 2019-06-27 LAB — VITAMIN D 25 HYDROXY (VIT D DEFICIENCY, FRACTURES): Vit D, 25-Hydroxy: 16 ng/mL — ABNORMAL LOW (ref 30–100)

## 2019-06-28 LAB — MISC LABCORP TEST (SEND OUT): Labcorp test code: 738526

## 2019-06-29 ENCOUNTER — Encounter: Payer: Self-pay | Admitting: Family Medicine

## 2019-06-29 DIAGNOSIS — M5116 Intervertebral disc disorders with radiculopathy, lumbar region: Secondary | ICD-10-CM | POA: Insufficient documentation

## 2019-06-29 DIAGNOSIS — R32 Unspecified urinary incontinence: Secondary | ICD-10-CM | POA: Insufficient documentation

## 2019-06-29 MED ORDER — OXYCODONE HCL ER 40 MG PO T12A: EXTENDED_RELEASE_TABLET | ORAL | 0 refills | Status: AC

## 2019-06-29 MED ORDER — OXYCODONE HCL ER 40 MG PO T12A: 40.0000 mg | EXTENDED_RELEASE_TABLET | Freq: Two times a day (BID) | ORAL | 0 refills | Status: AC

## 2019-06-29 MED ORDER — ZOLPIDEM TARTRATE 10 MG PO TABS: 10.0000 mg | ORAL_TABLET | Freq: Every day | ORAL | 5 refills | Status: DC

## 2019-06-29 MED ORDER — DIAZEPAM 5 MG PO TABS: ORAL_TABLET | ORAL | 5 refills | Status: DC

## 2019-06-29 MED ORDER — OXYCODONE HCL ER 40 MG PO T12A: 40.0000 mg | EXTENDED_RELEASE_TABLET | Freq: Two times a day (BID) | ORAL | 0 refills | Status: DC

## 2019-06-29 NOTE — Assessment & Plan Note (Signed)
Controlled, no change in medication DASH diet and commitment to daily physical activity for a minimum of 30 minutes discussed and encouraged, as a part of hypertension management. The importance of attaining a healthy weight is also discussed.  BP/Weight 06/26/2019 06/25/2019 06/24/2019 06/05/2019 05/08/2019 05/05/2019 123456  Systolic BP 123456 123456 123456 [id number]  Diastolic BP 98 82 82 72 80 83 82  Wt. (Lbs) 323.2 325 324.3 330 323 322.6 323  BMI 55.48 55.79 55.67 56.64 55.44 55.37 55.44

## 2019-06-29 NOTE — Assessment & Plan Note (Signed)
The patient's Controlled Substance registry is reviewed and compliance confirmed. Adequacy of  Pain control and level of function is assessed. Medication dosing is adjusted as deemed appropriate. Twelve weeks of medication is prescribed , patient signs for the script and is provided with a follow up appointment between 11 to 12 weeks .  

## 2019-06-29 NOTE — Progress Notes (Signed)
Angela [name]     MRN: [id number]      DOB: 13-Jul-1960   HPI Ms. Gorley is here for follow up and re-evaluation of chronic medical conditions, medication management and review of any available recent lab and radiology data.  Preventive health is updated, specifically  Cancer screening and Immunization.   Questions or concerns regarding consultations or procedures which the PT has had in the interim are  addressed. The PT denies any adverse reactions to current medications since the last visit.  C/o increasing debility, shortness of breath and poor exercise tolerance Face to face visit to request home equipment to improve safety and quality of life . Needs in home assistance with bathing, food prep and also for housekeeping and laundry due to severe limitation in safe mobility, due to arthritis and disc disease, morbid obesity, heart disease and pulmonary disease. Specifically requests and needs reacher, grabber, life line  , tub mat and grab bars. Also due to urinary incontinence requires incontinence supplies For help with diabetes management , needs Glucerna Reports improved blood sugar though still uncontrolled  Denies polyuria, polydipsia, blurred vision , or hypoglycemic episodes. 1 day h/o increased and uncontrolled nausea, no loose stool   ROS Denies recent fever or chills.Feels tired and fatigued easily Denies sinus pressure, nasal congestion, ear pain or sore throat. Denies chest congestion, productive cough or wheezing. Denies chest pains, palpitations and leg swelling 1 day h/o increased nausea,  No vomiting , denies diarrhea or constipation Denies dysuria, frequency, hesitancy c/o incontinence. C/o  joint pain, swelling and limitation in mobility. C/o lower extremity  numbness, and  tingling. Denies uncontrolled  depression, anxiety or insomnia. Denies skin break down or rash.   PE  BP 120/82   Pulse (!) 101   Temp (!) 97.1 F (36.2 C) (Temporal)   Resp 18   Ht 5'  4" (1.626 m)   Wt (!) 325 lb (147.4 kg)   SpO2 95%   BMI 55.79 kg/m   Patient alert and oriented and in mild  cardiopulmonary distress.  HEENT: No facial asymmetry, EOMI,     Neck supple .  Chest: Clear to auscultation bilaterally.decreased though adequate   CVS: S1, S2 no murmurs, no S3.Regular rate.  ABD: Soft mild epigastric tenderness, no guarding or rebound  Ext: No edema  MS: decreased  ROM spine, shoulders, hips and knees.  Skin: Intact, no ulcerations or rash noted.  Psych: Good eye contact, normal affect. Memory intact not anxious or depressed appearing.  CNS: CN 2-12 intact, power,  normal throughout.no focal deficits noted.   Assessment & Plan  Essential hypertension, benign Controlled, no change in medication DASH diet and commitment to daily physical activity for a minimum of 30 minutes discussed and encouraged, as a part of hypertension management. The importance of attaining a healthy weight is also discussed.  BP/Weight 06/26/2019 06/25/2019 06/24/2019 06/05/2019 05/08/2019 05/05/2019 123456  Systolic BP 123456 123456 123456 [id number] [id number] [id number]  Diastolic BP 98 82 82 72 80 83 82  Wt. (Lbs) 323.2 325 324.3 330 323 322.6 323  BMI 55.48 55.79 55.67 56.64 55.44 55.37 55.44       Morbid obesity (HCC) Increasingly debilitating  Patient re-educated about  the importance of commitment to a  minimum of 150 minutes of exercise per week as able.  The importance of healthy food choices with portion control discussed, as well as eating regularly and within a 12 hour window most days. The need to choose "clean ,  green" food 50 to 75% of the time is discussed, as well as to make water the primary drink and set a goal of 64 ounces water daily.    Weight /BMI 06/26/2019 06/25/2019 06/24/2019  WEIGHT 323 lb 3.2 oz 325 lb 324 lb 4.8 oz  HEIGHT 5\' 4"  5\' 4"  5\' 4"   BMI 55.48 kg/m2 55.79 kg/m2 55.67 kg/m2      Asthma Currently stable , no episodes of wheezing  Type 2 diabetes  mellitus with vascular disease ([place]) Uncontrolled and managed by Endo Ms. Tufte is reminded of the importance of commitment to daily physical activity for 30 minutes or more, as able and the need to limit carbohydrate intake to 30 to 60 grams per meal to help with blood sugar control.   The need to take medication as prescribed, test blood sugar as directed, and to call between visits if there is a concern that blood sugar is uncontrolled is also discussed.   Ms. Macht is reminded of the importance of daily foot exam, annual eye examination, and good blood sugar, blood pressure and cholesterol control.  Diabetic Labs Latest Ref Rng & Units 06/25/2019 06/05/2019 04/02/2019 12/19/2018 08/28/2018  HbA1c 4.0 - 5.6 % - - 13.0(A) 11.9(H) >14.0(H)  Microalbumin mg/dL - - - - -  Micro/Creat Ratio <30 mcg/mg creat - - - - -  Chol <200 mg/dL 91 - - - 90  HDL > OR = 50 mg/dL 46(L) - - - 31(L)  Calc LDL mg/dL (calc) 25 - - - 33  Triglycerides <150 mg/dL 115 - - - 179(H)  Creatinine 0.50 - 1.05 mg/dL 1.28(H) 0.98 - 0.96 0.90  GFR >60.00 mL/min - 70.27 - - -   BP/Weight 06/26/2019 06/25/2019 06/24/2019 06/05/2019 05/08/2019 05/05/2019 123456  Systolic BP 123456 123456 123456 [id number] [id number]  Diastolic BP 98 82 82 72 80 83 82  Wt. (Lbs) 323.2 325 324.3 330 323 322.6 323  BMI 55.48 55.79 55.67 56.64 55.44 55.37 55.44   Foot/eye exam completion dates Latest Ref Rng & Units 01/30/2019 02/07/2018  Eye Exam No Retinopathy - -  Foot Form Completion - Done Done   Updated lab needed and has f/u with Endo      Mixed hyperlipidemia Hyperlipidemia:Low fat diet discussed and encouraged.   Lipid Panel  Lab Results  Component Value Date   CHOL 91 06/25/2019   HDL 46 (L) 06/25/2019   LDLCALC 25 06/25/2019   TRIG 115 06/25/2019   CHOLHDL 2.0 06/25/2019   Controlled, needs to stop statin due to transaminitis     Transaminitis Stop statin, check hepatitis panel Rept in 3 weeks.gI to eval also  Encounter for pain  management The patient's Controlled Substance registry is reviewed and compliance confirmed. Adequacy of  Pain control and level of function is assessed. Medication dosing is adjusted as deemed appropriate. Twelve weeks of medication is prescribed , patient signs for the script and is provided with a follow up appointment between 11 to 12 weeks .   Contraindication to statin medication Marked elevation of liver enzymes Feb , 2021, stop statin and rept, acute change in 3 weeks, will refer to GI for evaluation also after  Hepatitis screen becomes available  Lumbar disc disease with radiculopathy Chronic pain and increased fall risk, needs home equipment for safety and function  Urinary incontinence Uncontrolled and requires supplies on a daily basis for hygiene and health

## 2019-06-29 NOTE — Assessment & Plan Note (Signed)
Uncontrolled and managed by Angela [name] Angela [name] is reminded of the importance of commitment to daily physical activity for 30 minutes or more, as able and the need to limit carbohydrate intake to 30 to 60 grams per meal to help with blood sugar control.   The need to take medication as prescribed, test blood sugar as directed, and to call between visits if there is a concern that blood sugar is uncontrolled is also discussed.   Angela [name] is reminded of the importance of daily foot exam, annual eye examination, and good blood sugar, blood pressure and cholesterol control.  Diabetic Labs Latest Ref Rng & Units 06/25/2019 06/05/2019 04/02/2019 12/19/2018 08/28/2018  HbA1c 4.0 - 5.6 % - - 13.0(A) 11.9(H) >14.0(H)  Microalbumin mg/dL - - - - -  Micro/Creat Ratio <30 mcg/mg creat - - - - -  Chol <200 mg/dL 91 - - - 90  HDL > OR = 50 mg/dL 46(L) - - - 31(L)  Calc LDL mg/dL (calc) 25 - - - 33  Triglycerides <150 mg/dL 115 - - - 179(H)  Creatinine 0.50 - 1.05 mg/dL 1.28(H) 0.98 - 0.96 0.90  GFR >60.00 mL/min - 70.27 - - -   BP/Weight 06/26/2019 06/25/2019 06/24/2019 06/05/2019 05/08/2019 05/05/2019 123456  Systolic BP 123456 123456 123456 [id number] [id number]  Diastolic BP 98 82 82 72 80 83 82  Wt. (Lbs) 323.2 325 324.3 330 323 322.6 323  BMI 55.48 55.79 55.67 56.64 55.44 55.37 55.44   Foot/eye exam completion dates Latest Ref Rng & Units 01/30/2019 02/07/2018  Eye Exam No Retinopathy - -  Foot Form Completion - Done Done   Updated lab needed and has f/u with Angela [name]

## 2019-06-29 NOTE — Assessment & Plan Note (Signed)
Marked elevation of liver enzymes Feb , 2021, stop statin and rept, acute change in 3 weeks, will refer to GI for evaluation also after  Hepatitis screen becomes available

## 2019-06-29 NOTE — Assessment & Plan Note (Signed)
Uncontrolled and requires supplies on a daily basis for hygiene and health

## 2019-06-29 NOTE — Assessment & Plan Note (Signed)
Currently stable , no episodes of wheezing

## 2019-06-29 NOTE — Assessment & Plan Note (Signed)
Increasingly debilitating  Patient re-educated about  the importance of commitment to a  minimum of 150 minutes of exercise per week as able.  The importance of healthy food choices with portion control discussed, as well as eating regularly and within a 12 hour window most days. The need to choose "clean , green" food 50 to 75% of the time is discussed, as well as to make water the primary drink and set a goal of 64 ounces water daily.    Weight /BMI 06/26/2019 06/25/2019 06/24/2019  WEIGHT 323 lb 3.2 oz 325 lb 324 lb 4.8 oz  HEIGHT 5\' 4"  5\' 4"  5\' 4"   BMI 55.48 kg/m2 55.79 kg/m2 55.67 kg/m2

## 2019-06-29 NOTE — Assessment & Plan Note (Signed)
Chronic pain and increased fall risk, needs home equipment for safety and function

## 2019-06-29 NOTE — Assessment & Plan Note (Signed)
Stop statin, check hepatitis panel Rept in 3 weeks.gI to eval also

## 2019-06-29 NOTE — Assessment & Plan Note (Signed)
Hyperlipidemia:Low fat diet discussed and encouraged.   Lipid Panel  Lab Results  Component Value Date   CHOL 91 06/25/2019   HDL 46 (L) 06/25/2019   LDLCALC 25 06/25/2019   TRIG 115 06/25/2019   CHOLHDL 2.0 06/25/2019   Controlled, needs to stop statin due to transaminitis

## 2019-07-02 ENCOUNTER — Other Ambulatory Visit: Payer: Self-pay | Admitting: Family Medicine

## 2019-07-02 DIAGNOSIS — M62830 Muscle spasm of back: Secondary | ICD-10-CM

## 2019-07-03 ENCOUNTER — Other Ambulatory Visit: Payer: Self-pay

## 2019-07-03 ENCOUNTER — Telehealth: Payer: Self-pay | Admitting: *Deleted

## 2019-07-03 ENCOUNTER — Other Ambulatory Visit (HOSPITAL_COMMUNITY)
Admission: RE | Admit: 2019-07-03 | Discharge: 2019-07-03 | Disposition: A | Discharge status: Home / Self Care | Payer: Medicare Other | Source: Ambulatory Visit | Attending: Cardiology | Admitting: Cardiology

## 2019-07-03 ENCOUNTER — Other Ambulatory Visit (HOSPITAL_COMMUNITY)
Admission: RE | Admit: 2019-07-03 | Discharge: 2019-07-03 | Disposition: A | Discharge status: Home / Self Care | Payer: Medicare Other | Source: Ambulatory Visit | Attending: Internal Medicine | Admitting: Internal Medicine

## 2019-07-03 DIAGNOSIS — Z20822 Contact with and (suspected) exposure to covid-19: Secondary | ICD-10-CM | POA: Insufficient documentation

## 2019-07-03 DIAGNOSIS — Z01812 Encounter for preprocedural laboratory examination: Secondary | ICD-10-CM | POA: Insufficient documentation

## 2019-07-03 DIAGNOSIS — I10 Essential (primary) hypertension: Secondary | ICD-10-CM | POA: Diagnosis not present

## 2019-07-03 DIAGNOSIS — E119 Type 2 diabetes mellitus without complications: Secondary | ICD-10-CM | POA: Diagnosis not present

## 2019-07-03 DIAGNOSIS — I2510 Atherosclerotic heart disease of native coronary artery without angina pectoris: Secondary | ICD-10-CM | POA: Diagnosis not present

## 2019-07-03 LAB — CBC
HCT: 39.4 % (ref 36.0–46.0)
Hemoglobin: 12.2 g/dL (ref 12.0–15.0)
MCH: 24.9 pg — ABNORMAL LOW (ref 26.0–34.0)
MCHC: 31 g/dL (ref 30.0–36.0)
MCV: 80.4 fL (ref 80.0–100.0)
Platelets: 389 10*3/uL (ref 150–400)
RBC: 4.9 MIL/uL (ref 3.87–5.11)
RDW: 14.6 % (ref 11.5–15.5)
WBC: 6.7 10*3/uL (ref 4.0–10.5)
nRBC: 0 % (ref 0.0–0.2)

## 2019-07-03 LAB — BASIC METABOLIC PANEL
Anion gap: 9 (ref 5–15)
BUN: 22 mg/dL — ABNORMAL HIGH (ref 6–20)
CO2: 26 mmol/L (ref 22–32)
Calcium: 9.5 mg/dL (ref 8.9–10.3)
Chloride: 101 mmol/L (ref 98–111)
Creatinine, Ser: 1.15 mg/dL — ABNORMAL HIGH (ref 0.44–1.00)
GFR calc Af Amer: >60 mL/min (ref >60)
GFR calc non Af Amer: 52 mL/min — ABNORMAL LOW (ref >60)
Glucose, Bld: 268 mg/dL — ABNORMAL HIGH (ref 70–99)
Potassium: 4.4 mmol/L (ref 3.5–5.1)
Sodium: 136 mmol/L (ref 135–145)

## 2019-07-03 NOTE — Telephone Encounter (Addendum)
Pt contacted pre-catheterization scheduled at [facility] for: Monday March 8,2021 9 AM Verified arrival time and place: [facility]) at: 7 AM   No solid food after midnight prior to cath, clear liquids until 5 AM day of procedure. Contrast allergy: no  Hold: Metformin-AM of procedure Glipizide-AM of procedure. Triamterene-HCT/KCl-AM of procedure. Insulin-1/2 usual HS dose prior to procedure.  AM meds can be  taken pre-cath with sips of water.    Confirmed patient has responsible adult to drive home post procedure and observe 24 hours after arriving home:   Currently, due to Covid-19 pandemic, only one person will be allowed with patient. Must be the same person for patient's entire stay and will be required to wear a mask. They will be asked to wait in the waiting room for the duration of the patient's stay.  Patients are required to wear a mask when they enter the hospital.  [facility] for patient to review procedure instructions

## 2019-07-03 NOTE — Telephone Encounter (Signed)
    COVID-19 Pre-Screening Questions:  . In the past 7 to 10 days have you had a cough,  shortness of breath, headache, congestion, fever (100 or greater) body aches, chills, sore throat, or sudden loss of taste or sense of smell? no . Have you been around anyone with known Covid 19 in 7-10 days? no . Have you been around anyone who is awaiting Covid 19 test results in the past 7 to 10 days? no . Have you been around anyone who has been exposed to Covid 19, or has mentioned symptoms of Covid 19 within the past 7 to 10 days? No   I reviewed procedure/mask/visitor instructions, COVID-19 screening questions with patient, she verbalized understanding, thanked me for call.

## 2019-07-04 LAB — SARS CORONAVIRUS 2 (TAT 6-24 HRS): SARS Coronavirus 2: NEGATIVE

## 2019-07-07 ENCOUNTER — Ambulatory Visit (HOSPITAL_COMMUNITY)
Admission: RE | Admit: 2019-07-07 | Discharge: 2019-07-07 | Disposition: A | Discharge status: Home / Self Care | Payer: Medicare Other | Attending: Cardiology | Admitting: Cardiology

## 2019-07-07 ENCOUNTER — Encounter (HOSPITAL_COMMUNITY)
Admission: RE | Disposition: A | Discharge status: Home / Self Care | Payer: Self-pay | Source: Home / Self Care | Attending: Cardiology

## 2019-07-07 ENCOUNTER — Other Ambulatory Visit: Payer: Self-pay

## 2019-07-07 DIAGNOSIS — E7800 Pure hypercholesterolemia, unspecified: Secondary | ICD-10-CM | POA: Insufficient documentation

## 2019-07-07 DIAGNOSIS — Z8249 Family history of ischemic heart disease and other diseases of the circulatory system: Secondary | ICD-10-CM | POA: Insufficient documentation

## 2019-07-07 DIAGNOSIS — M1990 Unspecified osteoarthritis, unspecified site: Secondary | ICD-10-CM | POA: Diagnosis not present

## 2019-07-07 DIAGNOSIS — E785 Hyperlipidemia, unspecified: Secondary | ICD-10-CM | POA: Insufficient documentation

## 2019-07-07 DIAGNOSIS — I5032 Chronic diastolic (congestive) heart failure: Secondary | ICD-10-CM | POA: Diagnosis not present

## 2019-07-07 DIAGNOSIS — I2510 Atherosclerotic heart disease of native coronary artery without angina pectoris: Secondary | ICD-10-CM | POA: Insufficient documentation

## 2019-07-07 DIAGNOSIS — E0590 Thyrotoxicosis, unspecified without thyrotoxic crisis or storm: Secondary | ICD-10-CM | POA: Insufficient documentation

## 2019-07-07 DIAGNOSIS — Z886 Allergy status to analgesic agent status: Secondary | ICD-10-CM | POA: Diagnosis not present

## 2019-07-07 DIAGNOSIS — Z888 Allergy status to other drugs, medicaments and biological substances status: Secondary | ICD-10-CM | POA: Insufficient documentation

## 2019-07-07 DIAGNOSIS — J45909 Unspecified asthma, uncomplicated: Secondary | ICD-10-CM | POA: Diagnosis not present

## 2019-07-07 DIAGNOSIS — K219 Gastro-esophageal reflux disease without esophagitis: Secondary | ICD-10-CM | POA: Insufficient documentation

## 2019-07-07 DIAGNOSIS — Z6841 Body Mass Index (BMI) 40.0 and over, adult: Secondary | ICD-10-CM | POA: Insufficient documentation

## 2019-07-07 DIAGNOSIS — Z885 Allergy status to narcotic agent status: Secondary | ICD-10-CM | POA: Diagnosis not present

## 2019-07-07 DIAGNOSIS — Z7951 Long term (current) use of inhaled steroids: Secondary | ICD-10-CM | POA: Diagnosis not present

## 2019-07-07 DIAGNOSIS — Z79890 Hormone replacement therapy (postmenopausal): Secondary | ICD-10-CM | POA: Insufficient documentation

## 2019-07-07 DIAGNOSIS — I5033 Acute on chronic diastolic (congestive) heart failure: Secondary | ICD-10-CM | POA: Diagnosis not present

## 2019-07-07 DIAGNOSIS — Z79899 Other long term (current) drug therapy: Secondary | ICD-10-CM | POA: Insufficient documentation

## 2019-07-07 DIAGNOSIS — F419 Anxiety disorder, unspecified: Secondary | ICD-10-CM | POA: Insufficient documentation

## 2019-07-07 DIAGNOSIS — E1140 Type 2 diabetes mellitus with diabetic neuropathy, unspecified: Secondary | ICD-10-CM | POA: Diagnosis not present

## 2019-07-07 DIAGNOSIS — F329 Major depressive disorder, single episode, unspecified: Secondary | ICD-10-CM | POA: Diagnosis not present

## 2019-07-07 DIAGNOSIS — Z794 Long term (current) use of insulin: Secondary | ICD-10-CM | POA: Diagnosis not present

## 2019-07-07 DIAGNOSIS — R0600 Dyspnea, unspecified: Secondary | ICD-10-CM | POA: Diagnosis not present

## 2019-07-07 DIAGNOSIS — G4730 Sleep apnea, unspecified: Secondary | ICD-10-CM | POA: Diagnosis not present

## 2019-07-07 DIAGNOSIS — R0609 Other forms of dyspnea: Secondary | ICD-10-CM

## 2019-07-07 DIAGNOSIS — I110 Hypertensive heart disease with heart failure: Secondary | ICD-10-CM | POA: Insufficient documentation

## 2019-07-07 DIAGNOSIS — I10 Essential (primary) hypertension: Secondary | ICD-10-CM | POA: Diagnosis present

## 2019-07-07 HISTORY — PX: RIGHT HEART CATH: CATH118263

## 2019-07-07 LAB — POCT I-STAT EG7
Acid-Base Excess: 3 mmol/L — ABNORMAL HIGH (ref 0.0–2.0)
Bicarbonate: 26.9 mmol/L (ref 20.0–28.0)
Bicarbonate: 29.7 mmol/L — ABNORMAL HIGH (ref 20.0–28.0)
Calcium, Ion: 1.2 mmol/L (ref 1.15–1.40)
Calcium, Ion: 1.28 mmol/L (ref 1.15–1.40)
HCT: 36 % (ref 36.0–46.0)
HCT: 37 % (ref 36.0–46.0)
Hemoglobin: 12.2 g/dL (ref 12.0–15.0)
Hemoglobin: 12.6 g/dL (ref 12.0–15.0)
O2 Saturation: 71 %
O2 Saturation: 72 %
Potassium: 3.7 mmol/L (ref 3.5–5.1)
Potassium: 4 mmol/L (ref 3.5–5.1)
Sodium: 139 mmol/L (ref 135–145)
Sodium: 141 mmol/L (ref 135–145)
TCO2: 28 mmol/L (ref 22–32)
TCO2: 31 mmol/L (ref 22–32)
pCO2, Ven: 51.1 mmHg (ref 44.0–60.0)
pCO2, Ven: 54.9 mmHg (ref 44.0–60.0)
pH, Ven: 7.33 (ref 7.250–7.430)
pH, Ven: 7.341 (ref 7.250–7.430)
pO2, Ven: 40 mmHg (ref 32.0–45.0)
pO2, Ven: 41 mmHg (ref 32.0–45.0)

## 2019-07-07 LAB — GLUCOSE, CAPILLARY
Glucose-Capillary: 336 mg/dL — ABNORMAL HIGH (ref 70–99)
Glucose-Capillary: 347 mg/dL — ABNORMAL HIGH (ref 70–99)
Glucose-Capillary: 320 mg/dL — ABNORMAL HIGH (ref 70–99)

## 2019-07-07 SURGERY — RIGHT HEART CATH
Anesthesia: LOCAL

## 2019-07-07 MED ORDER — LABETALOL HCL 5 MG/ML IV SOLN: 10.0000 mg | INTRAVENOUS | Status: DC | PRN

## 2019-07-07 MED ORDER — SODIUM CHLORIDE 0.9% FLUSH: 3.0000 mL | Freq: Two times a day (BID) | INTRAVENOUS | Status: DC

## 2019-07-07 MED ORDER — INSULIN ASPART 100 UNIT/ML ~~LOC~~ SOLN
5.0000 [IU] | Freq: Once | SUBCUTANEOUS | Status: DC
  Filled 2019-07-07: qty 0.05

## 2019-07-07 MED ORDER — HEPARIN (PORCINE) IN NACL 1000-0.9 UT/500ML-% IV SOLN
INTRAVENOUS | Status: DC | PRN
  Administered 2019-07-07: 500 mL

## 2019-07-07 MED ORDER — SODIUM CHLORIDE 0.9 % IV SOLN: INTRAVENOUS | Status: DC

## 2019-07-07 MED ORDER — SODIUM CHLORIDE 0.9% FLUSH: 3.0000 mL | INTRAVENOUS | Status: DC | PRN

## 2019-07-07 MED ORDER — LIDOCAINE HCL (PF) 1 % IJ SOLN
INTRAMUSCULAR | Status: AC
  Filled 2019-07-07: qty 30

## 2019-07-07 MED ORDER — ONDANSETRON HCL 4 MG/2ML IJ SOLN: 4.0000 mg | Freq: Four times a day (QID) | INTRAMUSCULAR | Status: DC | PRN

## 2019-07-07 MED ORDER — HYDRALAZINE HCL 20 MG/ML IJ SOLN: 10.0000 mg | INTRAMUSCULAR | Status: DC | PRN

## 2019-07-07 MED ORDER — SODIUM CHLORIDE 0.9 % IV SOLN: 250.0000 mL | INTRAVENOUS | Status: DC | PRN

## 2019-07-07 MED ORDER — ACETAMINOPHEN 325 MG PO TABS: 650.0000 mg | ORAL_TABLET | ORAL | Status: DC | PRN

## 2019-07-07 MED ORDER — HEPARIN (PORCINE) IN NACL 1000-0.9 UT/500ML-% IV SOLN
INTRAVENOUS | Status: AC
  Filled 2019-07-07: qty 500

## 2019-07-07 MED ORDER — LIDOCAINE HCL (PF) 1 % IJ SOLN
INTRAMUSCULAR | Status: DC | PRN
  Administered 2019-07-07: 2 mL

## 2019-07-07 MED ORDER — INSULIN ASPART 100 UNIT/ML ~~LOC~~ SOLN
5.0000 [IU] | Freq: Once | SUBCUTANEOUS | Status: AC
  Administered 2019-07-07: 5 [IU] via SUBCUTANEOUS
  Filled 2019-07-07: qty 1
  Filled 2019-07-07: qty 0.05

## 2019-07-07 SURGICAL SUPPLY — 6 items
CATH BALLN WEDGE 5F 110CM (CATHETERS) ×1 IMPLANT
PROTECTION STATION PRESSURIZED (MISCELLANEOUS) ×2
SHEATH GLIDE SLENDER 4/5FR (SHEATH) ×1 IMPLANT
STATION PROTECTION PRESSURIZED (MISCELLANEOUS) IMPLANT
TUBING ART PRESS 72  MALE/FEM (TUBING) ×2
TUBING ART PRESS 72 MALE/FEM (TUBING) IMPLANT

## 2019-07-07 NOTE — Interval H&P Note (Signed)
History and Physical Interval Note:  07/07/2019 9:33 AM  Angela [name]  has presented today for surgery, with the diagnosis of Diastolic heart failure.  The various methods of treatment have been discussed with the patient and family. After consideration of risks, benefits and other options for treatment, the patient has consented to  Procedure(s): RIGHT HEART CATH (N/A) as a surgical intervention.  The patient's history has been reviewed, patient examined, no change in status, stable for surgery.  I have reviewed the patient's chart and labs.  Questions were answered to the patient's satisfaction.     Glenetta Hew

## 2019-07-07 NOTE — Discharge Instructions (Signed)
Venogram A venogram, or venography, is a procedure that uses an X-ray and dye (contrast) to examine how well the veins work and how blood flows through them. Contrast helps the veins show up on X-rays. A venogram may be done:  To evaluate abnormalities in the vein.  To identify clots within veins, such as deep vein thrombosis (DVT).  To map out the veins that might be needed for another procedure. Tell a health care provider about:  Any allergies you have, especially to medicines, shellfish, iodine, and contrast.  All medicines you are taking, including vitamins, herbs, eye drops, creams, and over-the-counter medicines.  Any problems you or family members have had with anesthetic medicines.  Any blood disorders you have.  Any surgeries you have had and any complications that occurred.  Any medical conditions you have.  Whether you are pregnant, may be pregnant, or are breastfeeding.  Any history of smoking or tobacco use. What are the risks? Generally, this is a safe procedure. However, problems may occur, including:  Infection.  Bleeding.  Blood clots.  Allergic reaction to medicines or contrast.  Damage to other structures or organs.  Kidney problems.  Increased risk of cancer. Being exposed to too much radiation over a lifetime can increase the risk of cancer. The risk is small. What happens before the procedure? Medicines Ask your health care provider about:  Changing or stopping your regular medicines. This is especially important if you are taking diabetes medicines or blood thinners.  Taking medicines such as aspirin and ibuprofen. These medicines can thin your blood. Do not take these medicines unless your health care provider tells you to take them.  Taking over-the-counter medicines, vitamins, herbs, and supplements. General instructions  Follow instructions from your health care provider about eating or drinking restrictions.  You may have blood tests  to check how well your kidneys and liver are working and how well your blood can clot.  Plan to have someone take you home from the hospital or clinic. What happens during the procedure?   An IV will be inserted into one of your veins.  You may be given a medicine to help you relax (sedative).  You will lie down on an X-ray table. The table may be tilted in different directions during the procedure to help the contrast move throughout your body. Safety straps will keep you secure if the table is tilted.  If veins in your arm or leg will be examined, a band may be wrapped around that arm or leg to keep the veins full of blood. This may cause your arm or leg to feel numb.  The contrast will be injected into your IV. You may have a hot, flushed feeling as it moves throughout your body. You may also have a metallic taste in your mouth. Both of these sensations will go away after the test is complete.  You may be asked to lie in different positions or place your legs or arms in different positions.  At the end of the procedure, you may be given IV fluids to help wash or flush the contrast out of your veins.  The IV will be removed, and pressure will be applied to the IV site to prevent bleeding. A bandage (dressing) may be applied to the IV site. The exact procedure may vary among health care providers and hospitals. What can I expect after the procedure?  Your blood pressure, heart rate, breathing rate, and blood oxygen level will be monitored until you   leave the hospital or clinic.  You may be given something to eat and drink.  You may have bruising or mild discomfort in the area where the IV was inserted. Follow these instructions at home: Eating and drinking   Follow instructions from your health care provider about eating or drinking restrictions.  Drink a lot of water for the first several days after the procedure, as directed by your health care provider. This helps to flush the  contrast out of your body. Activity  Rest as told by your health care provider.  Return to your normal activities as told by your health care provider. Ask your health care provider what activities are safe for you.  If you were given a sedative during your procedure, do not drive for 24 hours or until your health care provider approves. General instructions  Check your IV insertion area every day for signs of infection. Check for: ? Redness, swelling, or pain. ? Fluid or blood. ? Warmth. ? Pus or a bad smell.  Take over-the-counter and prescription medicines only as told by your health care provider.  Keep all follow-up visits as told by your health care provider. This is important. Contact a health care provider if:  Your skin becomes itchy or you develop a rash or hives.  You have a fever that does not get better with medicine.  You feel nauseous or you vomit.  You have redness, swelling, or pain around the insertion site.  You have fluid or blood coming from the insertion site.  Your insertion area feels warm to the touch.  You have pus or a bad smell coming from the insertion site. Get help right away if you:  Have shortness of breath or difficulty breathing.  Develop chest pain.  Faint.  Feel very dizzy. These symptoms may represent a serious problem that is an emergency. Do not wait to see if the symptoms will go away. Get medical help right away. Call your local emergency services (911 in the [place] Do not drive yourself to the hospital. Summary  A venogram, or venography, is a procedure that uses an X-ray and contrast dye to check how well the veins work and how blood flows through them.  An IV will be inserted into one of your veins in order to inject the contrast.  During the exam, you will lie on an X-ray table. The table may be tilted in different directions during the procedure to help the contrast move throughout your body. Safety straps will keep you  secure.  After the procedure, you will need to drink a lot of water to help wash or flush the contrast out of your body. This information is not intended to replace advice given to you by your health care provider. Make sure you discuss any questions you have with your health care provider. Document Revised: 11/23/2018 Document Reviewed: 11/23/2018 [employer] Patient Education  2020 [employer]  

## 2019-07-08 ENCOUNTER — Other Ambulatory Visit: Payer: Self-pay | Admitting: "Endocrinology

## 2019-07-09 ENCOUNTER — Other Ambulatory Visit: Payer: Self-pay

## 2019-07-09 ENCOUNTER — Ambulatory Visit (HOSPITAL_COMMUNITY)
Admission: RE | Admit: 2019-07-09 | Discharge: 2019-07-09 | Disposition: A | Discharge status: Home / Self Care | Payer: Medicare Other | Source: Ambulatory Visit | Attending: Internal Medicine | Admitting: Internal Medicine

## 2019-07-09 DIAGNOSIS — R0600 Dyspnea, unspecified: Secondary | ICD-10-CM | POA: Diagnosis not present

## 2019-07-09 DIAGNOSIS — I5031 Acute diastolic (congestive) heart failure: Secondary | ICD-10-CM | POA: Diagnosis not present

## 2019-07-09 DIAGNOSIS — R0609 Other forms of dyspnea: Secondary | ICD-10-CM

## 2019-07-09 NOTE — Progress Notes (Signed)
*  PRELIMINARY RESULTS* Echocardiogram 2D Echocardiogram has been performed.  Angela [name] 07/09/2019, 2:02 PM

## 2019-07-16 ENCOUNTER — Encounter: Payer: Self-pay | Admitting: "Endocrinology

## 2019-07-16 ENCOUNTER — Encounter: Payer: Medicare Other | Attending: "Endocrinology | Admitting: Nutrition

## 2019-07-16 ENCOUNTER — Ambulatory Visit (INDEPENDENT_AMBULATORY_CARE_PROVIDER_SITE_OTHER): Payer: Medicare Other | Admitting: "Endocrinology

## 2019-07-16 ENCOUNTER — Ambulatory Visit: Payer: Medicare Other | Admitting: "Endocrinology

## 2019-07-16 ENCOUNTER — Other Ambulatory Visit: Payer: Self-pay

## 2019-07-16 VITALS — BP 132/82 | HR 71 | Ht 64.0 in | Wt 327.6 lb

## 2019-07-16 DIAGNOSIS — E1165 Type 2 diabetes mellitus with hyperglycemia: Secondary | ICD-10-CM

## 2019-07-16 DIAGNOSIS — I10 Essential (primary) hypertension: Secondary | ICD-10-CM | POA: Diagnosis not present

## 2019-07-16 DIAGNOSIS — E782 Mixed hyperlipidemia: Secondary | ICD-10-CM | POA: Diagnosis not present

## 2019-07-16 DIAGNOSIS — E039 Hypothyroidism, unspecified: Secondary | ICD-10-CM | POA: Diagnosis not present

## 2019-07-16 LAB — POCT GLYCOSYLATED HEMOGLOBIN (HGB A1C): Hemoglobin A1C: 11.8 % — AB (ref 4.0–5.6)

## 2019-07-16 NOTE — Progress Notes (Signed)
07/16/2019, 6:01 PM                    Endocrinology follow-up note    Subjective:    [name], female    DOB: 07-Nov-1960.  [place] is being seen in follow-up in the management of currently uncontrolled symptomatic type 2 diabetes.  She also has comorbid dyslipidemia, hypertension.   PMD:  Fayrene Helper, MD.   Past Medical History:  Diagnosis Date  . Anxiety   . Arthritis   . Asthma   . Coronary atherosclerosis of native coronary artery    Mild nonobstructive CAD by cardiac catheterization 2008 - Dr. Terrence Dupont  . Degenerative disc disease   . Depression   . Dysrhythmia   . Essential hypertension, benign   . Folliculitis 4/80/1655  . GERD (gastroesophageal reflux disease)   . H/O hiatal hernia   . Helicobacter pylori gastritis 2008  . Hypercholesterolemia   . Hypertension   . Hyperthyroidism    s/p radiation  . Left-sided epistaxis 05/15/2018  . Low back pain   . Migraine   . Nephrolithiasis    Recurring episodes since 2004  . Severe obesity (BMI >= 40) (HCC)   . Sleep apnea    STOP BANG SCORE 6no cpap used  . Type 2 diabetes mellitus with diabetic neuropathy [facility])     Past Surgical History:  Procedure Laterality Date  . ABDOMINAL HYSTERECTOMY    . BACK SURGERY  1993  . BACK SURGERY  1994  . BACK SURGERY  2002  . BACK SURGERY  2011   [place] Left   . CHOLECYSTECTOMY  1996  . COLONOSCOPY  2000 [facility]   INT HEMORRHOIDS/FISSURE  . COLONOSCOPY  2003 [facility], CHANGE IN BOWEL HABITS   INT HEMORRHOIDS  . COLONOSCOPY  2006 [facility]   INT HEMORRHOIDS  . COLONOSCOPY  2007 [facility]   INT HEMORRHOIDS  . CYSTOSCOPY WITH STENT PLACEMENT Left 07/25/2012   Procedure: CYSTOSCOPY, left retograde pyelogram WITH left ureteral  STENT PLACEMENT;  Surgeon: Claybon Jabs, MD;  Location: [facility];  Service: Urology;  Laterality: Left;  . CYSTOSCOPY/RETROGRADE/URETEROSCOPY  12/04/2011   Procedure:  CYSTOSCOPY/RETROGRADE/URETEROSCOPY;  Surgeon: Molli Hazard, MD;  Location: [facility];  Service: Urology;  Laterality: Right;  . CYSTOSCOPY/RETROGRADE/URETEROSCOPY/STONE EXTRACTION WITH BASKET Left 09/03/2012   Procedure: CYSTOSCOPY/RETROGRADE pyelogram/digital URETEROSCOPY/STONE EXTRACTION WITH BASKET, left stent removal;  Surgeon: Molli Hazard, MD;  Location: [facility];  Service: Urology;  Laterality: Left;  . ESOPHAGOGASTRODUODENOSCOPY  2008   [facility] hiatal hernia./Normal esophagus without evidence of Barrett's mass, stricture, erosion or ulceration./Normal duodenal bulb and second portion of the duodenum./Diffuse erythema in the body and the antrum with occasional erosion.  Biopsies obtained via cold forceps to evaluate for H. pylori gastritis  . FRACTURE SURGERY  1999   right clavicle  . HOLMIUM LASER APPLICATION Left 12/05/7542   Procedure: HOLMIUM LASER APPLICATION;  Surgeon: Molli Hazard, MD;  Location: [facility];  Service: Urology;  Laterality: Left;  . KNEE ARTHROSCOPY  10/04/2010,    right knee arthroscopy, dr Theda Sers  . LEFT HEART CATH AND CORONARY ANGIOGRAPHY N/A 05/08/2018   Procedure: LEFT HEART CATH AND CORONARY ANGIOGRAPHY;  Surgeon: Troy Sine, MD;  Location: [place] [facility];  Service: Cardiovascular;  Laterality: N/A;  . left knee arthroscopic surgery  1999  . Left salphingectomy secondary to ectopic pregnancy  1991  .  PARTIAL HYSTERECTOMY  1991  . RIGHT HEART CATH N/A 07/07/2019   Procedure: RIGHT HEART CATH;  Surgeon: Leonie Man, MD;  Location: [place] [facility];  Service: Cardiovascular;  Laterality: N/A;  . rotary cuff  Right 05/14/2014   [facility]  . UPPER GASTROINTESTINAL ENDOSCOPY  2008 abd pain   H. pylori gastritis  . UPPER GASTROINTESTINAL ENDOSCOPY  1996 [facility] [place]   PUD    Social History   Socioeconomic History  . Marital status: Single    Spouse name: Not on file  . Number of children: 1  . Years of education: 71  . Highest  education level: 12th grade  Occupational History  . Occupation: Disable   Tobacco Use  . Smoking status: Never Smoker  . Smokeless tobacco: Never Used  Substance and Sexual Activity  . Alcohol use: No  . Drug use: No  . Sexual activity: Not Currently    Partners: Male    Birth control/protection: Surgical    Comment: hyst  Other Topics Concern  . Not on file  Social History Narrative   Lives alone    Social Determinants of Health   Financial Resource Strain:   . Difficulty of Paying Living Expenses:   Food Insecurity:   . Worried About Charity fundraiser in the Last Year:   . Arboriculturist in the Last Year:   Transportation Needs:   . Film/video editor (Medical):   Marland Kitchen Lack of Transportation (Non-Medical):   Physical Activity:   . Days of Exercise per Week:   . Minutes of Exercise per Session:   Stress:   . Feeling of Stress :   Social Connections:   . Frequency of Communication with Friends and Family:   . Frequency of Social Gatherings with Friends and Family:   . Attends Religious Services:   . Active Member of Clubs or Organizations:   . Attends Archivist Meetings:   Marland Kitchen Marital Status:     Family History  Problem Relation Age of Onset  . Heart disease Mother   . Hypertension Mother   . Cancer Mother        Cervical   . Asthma Mother   . Heart attack Father   . Cancer Father        Prostate  . Colon cancer Father        DECEASED AGE 44  . Colon polyps Neg Hx     Outpatient Encounter Medications as of 07/16/2019  Medication Sig  . albuterol (VENTOLIN HFA) 108 (90 Base) MCG/ACT inhaler Inhale 2 puffs into the lungs every 6 (six) hours as needed for wheezing or shortness of breath.  Marland Kitchen amLODipine (NORVASC) 10 MG tablet Take 1 tablet (10 mg total) by mouth daily.  . B-D UF III MINI PEN NEEDLES 31G X 5 MM MISC USE ONCE DAILY AS DIRECTED  . blood glucose meter kit and supplies Dispense based on patient and insurance preference. Once daily  testing dx. e11.9  . calcipotriene (DOVONOX) 0.005 % ointment Apply 1 application topically daily.   . cloNIDine (CATAPRES - DOSED IN MG/24 HR) 0.3 mg/24hr patch APPLY 1 PATCH TO SKIN ONCE A WEEK (Patient taking differently: Place 0.3 mg onto the skin every Tuesday. APPLY 1 PATCH TO SKIN ONCE A WEEK)  . cloNIDine (CATAPRES) 0.3 MG tablet Take 1 tablet (0.3 mg total) by mouth at bedtime.  . cyclobenzaprine (FLEXERIL) 10 MG tablet TAKE 1 TABLET BY MOUTH THREE TIMES A  DAY  . diazepam (VALIUM) 5 MG tablet Take one tablet by mouth three times daily for back spasm  . DULoxetine (CYMBALTA) 60 MG capsule TAKE 1 CAPSULE BY MOUTH TWICE A DAY (Patient taking differently: Take 60 mg by mouth 2 (two) times daily. )  . ergocalciferol (VITAMIN D2) 1.25 MG (50000 UT) capsule Take 1 capsule (50,000 Units total) by mouth once a week. One capsule once weekly (Patient taking differently: Take 50,000 Units by mouth every Monday. )  . esomeprazole (NEXIUM) 40 MG capsule TAKE 1 CAPSULE BY MOUTH EVERY DAY (Patient taking differently: Take 40 mg by mouth daily before breakfast. )  . fluconazole (DIFLUCAN) 150 MG tablet Take one tablet by mouth once daily, as needed, for vaginal itch associated with whig dose prednisone and diabetes (Patient not taking: Reported on 06/27/2019)  . fluticasone (FLONASE) 50 MCG/ACT nasal spray Place 2 sprays into both nostrils daily. (Patient taking differently: Place 2 sprays into both nostrils daily as needed for allergies. )  . gabapentin (NEURONTIN) 800 MG tablet TAKE 1 TABLET BY MOUTH FOUR TIMES A DAY (Patient taking differently: Take 800 mg by mouth in the morning, at noon, in the evening, and at bedtime. )  . glipiZIDE (GLUCOTROL XL) 10 MG 24 hr tablet Take 1 tablet (10 mg total) by mouth daily with breakfast.  . hydrOXYzine (ATARAX/VISTARIL) 10 MG tablet Take 10 mg by mouth in the morning, at noon, and at bedtime.   . Insulin Glargine (LANTUS SOLOSTAR) 100 UNIT/ML Solostar Pen Inject 80  Units into the skin at bedtime.  Marland Kitchen ipratropium-albuterol (DUONEB) 0.5-2.5 (3) MG/3ML SOLN Take 3 mLs by nebulization every 6 (six) hours as needed. (Patient taking differently: Take 3 mLs by nebulization in the morning, at noon, and at bedtime. )  . KLOR-CON M20 20 MEQ tablet TAKE 1 TABLET BY MOUTH TWICE A DAY (Patient taking differently: Take 20 mEq by mouth 2 (two) times daily. )  . Lancets (ONETOUCH DELICA PLUS [id number]) MISC TEST ONCE DAILY  . levothyroxine (SYNTHROID) 50 MCG tablet TAKE 1 TABLET BY MOUTH DAILY BEFORE BREAKFAST (Patient taking differently: Take 50 mcg by mouth daily before breakfast. )  . loratadine (CLARITIN) 10 MG tablet Take 1 tablet (10 mg total) by mouth daily.  . Magnesium Oxide 400 MG CAPS Take one (Patient taking differently: Take 400 mg by mouth at bedtime. Take one)  . meclizine (ANTIVERT) 25 MG tablet Take 25 mg by mouth in the morning, at noon, and at bedtime.   . metFORMIN (GLUCOPHAGE) 1000 MG tablet Take 1 tablet (1,000 mg total) by mouth 2 (two) times daily.  . metoprolol tartrate (LOPRESSOR) 25 MG tablet Take 1 tablet (25 mg total) by mouth 2 (two) times daily.  . montelukast (SINGULAIR) 10 MG tablet TAKE 1 TABLET (10 MG TOTAL) BY MOUTH AT BEDTIME.  . nitroGLYCERIN (NITROSTAT) 0.4 MG SL tablet Place 1 tablet (0.4 mg total) under the tongue every 5 (five) minutes as needed for chest pain.  Marland Kitchen ondansetron (ZOFRAN) 4 MG tablet Take 1 tablet (4 mg total) by mouth every 8 (eight) hours as needed for nausea or vomiting.  Glory Rosebush VERIO test strip USE AS INSTRUCTED FOUR TIMES DAILY TESTING DX E11.65  . oxyCODONE (OXYCONTIN) 40 mg 12 hr tablet Take one tablet by mouth two times daily by mouth for chronic pain  . [START ON 08/11/2019] oxyCODONE (OXYCONTIN) 40 mg 12 hr tablet Take 1 tablet (40 mg total) by mouth every 12 (twelve) hours.  Derrill Memo ON  09/10/2019] oxyCODONE (OXYCONTIN) 40 mg 12 hr tablet Take 1 tablet (40 mg total) by mouth every 12 (twelve) hours.  .  SYMBICORT 160-4.5 MCG/ACT inhaler TAKE 2 PUFFS BY MOUTH TWICE A DAY (Patient taking differently: Inhale 2 puffs into the lungs in the morning and at bedtime. )  . triamterene-hydrochlorothiazide (MAXZIDE) 75-50 MG tablet Take 1 tablet by mouth daily.  Marland Kitchen UNABLE TO FIND Glucerna 114 U/M or PRN Chux 90/M or PRN Incontinence pads 198/M or PRN  Tub Mat x 1  Grab bars  . UNABLE TO FIND Reacher/Grabber x 1  Lift chair x 1  Lifeline X1  . zolpidem (AMBIEN) 10 MG tablet Take 1 tablet (10 mg total) by mouth at bedtime.  Marland Kitchen zolpidem (AMBIEN) 10 MG tablet Take 1 tablet (10 mg total) by mouth at bedtime.   No facility-administered encounter medications on file as of 07/16/2019.    ALLERGIES: Allergies  Allergen Reactions  . Morphine Other (See Comments)    Reaction with esophagus, unable to swallow.   . Ace Inhibitors Cough  . Aspirin Other (See Comments)    Reaction with esophagus, unable to swallow.   . Losartan Cough  . Tapentadol Other (See Comments)    Nausea, increased sleepiness, h/a  . Tomato Other (See Comments)    Acid reflux due to acid in tomato  . Adhesive [Tape] Rash    VACCINATION STATUS: Immunization History  Administered Date(s) Administered  . Influenza Split 03/30/2011  . Influenza Whole 03/21/2005, 02/09/2006  . Influenza,inj,Quad PF,6+ Mos 02/11/2014, 07/27/2015, 03/09/2016, 01/29/2017, 02/07/2018, 01/30/2019  . Influenza-Unspecified 01/29/2017  . Pneumococcal Polysaccharide-23 04/17/2006, 09/14/2015  . Td 04/17/2006    Diabetes She presents for her follow-up diabetic visit. She has type 2 diabetes mellitus. Her disease course has been worsening. There are no hypoglycemic associated symptoms. Pertinent negatives for hypoglycemia include no confusion, headaches, pallor or seizures. Associated symptoms include fatigue. Pertinent negatives for diabetes include no blurred vision, no chest pain, no polydipsia, no polyphagia and no polyuria. There are no hypoglycemic  complications. Symptoms are worsening. Diabetic complications include heart disease. Risk factors for coronary artery disease include dyslipidemia, diabetes mellitus, family history, hypertension, obesity, sedentary lifestyle and tobacco exposure. Current diabetic treatment includes oral agent (dual therapy). Her weight is increasing steadily. She is following a generally unhealthy diet. When asked about meal planning, she reported none. She has had a previous visit with a dietitian. She never participates in exercise. Her home blood glucose trend is increasing steadily. Her breakfast blood glucose range is generally >200 mg/dl. Her lunch blood glucose range is generally >200 mg/dl. Her dinner blood glucose range is generally >200 mg/dl. Her bedtime blood glucose range is generally >200 mg/dl. Her overall blood glucose range is >200 mg/dl. (She has documented slightly improving glycemic profile, still significantly above target.  Her point-of-care A1c is 11.8% improving from 13%.  She has lowered her Lantus to 60 units for unclear reasons.    ) An ACE inhibitor/angiotensin II receptor blocker is being taken. She does not see a podiatrist.Eye exam is not current.  Hyperlipidemia This is a chronic problem. The current episode started more than 1 year ago. The problem is controlled. Exacerbating diseases include diabetes and obesity. Associated symptoms include shortness of breath. Pertinent negatives include no chest pain or myalgias. Risk factors for coronary artery disease include diabetes mellitus, dyslipidemia, hypertension, obesity and a sedentary lifestyle.  Hypertension This is a chronic problem. The current episode started more than 1 year ago. The problem  is uncontrolled. Associated symptoms include shortness of breath. Pertinent negatives include no blurred vision, chest pain, headaches or palpitations. Risk factors for coronary artery disease include family history, dyslipidemia, diabetes mellitus,  obesity, sedentary lifestyle and post-menopausal state. Past treatments include angiotensin blockers. Hypertensive end-organ damage includes CAD/MI and heart failure.      Review of systems  Constitutional: + Progressive weight gain,   current  Body mass index is 56.23 kg/m. , no fatigue, no subjective hyperthermia, no subjective hypothermia Eyes: no blurry vision, no xerophthalmia ENT: no sore throat, no nodules palpated in throat, no dysphagia/odynophagia, no hoarseness Cardiovascular: no Chest Pain, no Shortness of Breath, no palpitations, no leg swelling Respiratory: +cough, + shortness of breath, + wheezez Gastrointestinal: no Nausea/Vomiting/Diarhhea Musculoskeletal: no muscle/joint aches Skin: no rashes, no hyperemia Neurological: no tremors, no numbness, no tingling, no dizziness Psychiatric: no depression, no anxiety   Objective:    BP 132/82   Pulse 71   Ht '5\' 4"'$  (1.626 m)   Wt (!) 327 lb 9.6 oz (148.6 kg)   BMI 56.23 kg/m   Wt Readings from Last 3 Encounters:  07/16/19 (!) 327 lb 9.6 oz (148.6 kg)  07/07/19 (!) 336 lb (152.4 kg)  06/26/19 (!) 323 lb 3.2 oz (146.6 kg)       Physical Exam- Limited  Constitutional:  Body mass index is 56.23 kg/m. ,  normal state of mind Eyes:  EOMI, no exophthalmos Neck: Supple Thyroid: No gross goiter Respiratory: Adequate breathing efforts, + wheezes Musculoskeletal: no gross deformities, strength intact in all four extremities, no gross restriction of joint movements Skin:  no rashes, no hyperemia Neurological: no tremor with outstretched hands,   CMP ( most recent) CMP     Component Value Date/Time   NA 139 07/07/2019 1007   K 4.0 07/07/2019 1007   CL 101 07/03/2019 1519   CO2 26 07/03/2019 1519   GLUCOSE 268 (H) 07/03/2019 1519   BUN 22 (H) 07/03/2019 1519   CREATININE 1.15 (H) 07/03/2019 1519   CREATININE 1.28 (H) 06/25/2019 1225   CALCIUM 9.5 07/03/2019 1519   PROT 8.4 (H) 06/25/2019 1225   ALBUMIN 4.0  06/05/2019 1019   AST 274 (H) 06/25/2019 1225   ALT 138 (H) 06/25/2019 1225   ALKPHOS 92 06/05/2019 1019   BILITOT 0.5 06/25/2019 1225   GFRNONAA 52 (L) 07/03/2019 1519   GFRNONAA 46 (L) 06/25/2019 1225   GFRAA >60 07/03/2019 1519   GFRAA 53 (L) 06/25/2019 1225    Diabetic Labs (most recent): Lab Results  Component Value Date   HGBA1C 11.8 (A) 07/16/2019   HGBA1C 13.0 (A) 04/02/2019   HGBA1C 11.9 (H) 12/19/2018     Lipid Panel ( most recent) Lipid Panel     Component Value Date/Time   CHOL 91 06/25/2019 1225   TRIG 115 06/25/2019 1225   HDL 46 (L) 06/25/2019 1225   CHOLHDL 2.0 06/25/2019 1225   VLDL 16 05/06/2018 0335   LDLCALC 25 06/25/2019 1225      Assessment & Plan:   She was encouraged to use one of her inhalers in exam room, which helped with her breathing.  She is advised to go to emergency room if she does not improve in 1 to 2 hours.  This was discussed in detail with her daughter who is her current aide.  1. Uncontrolled type 2 diabetes mellitus with hyperglycemia ([place])   - [place] has currently uncontrolled symptomatic type 2 DM since  59 years of age.  -  She is accompanied today by her daughter with her aide at this time.  She has documented slightly improving glycemic profile, still significantly above target.  Her point-of-care A1c is 11.8% improving from 13%.  She has lowered her Lantus to 60 units for unclear reasons.    -  She did have A1c of greater than 14% last year.     Recent labs reviewed.  -her diabetes is complicated by coronary artery disease, obesity/sedentary life and she remains at a high risk for more acute and chronic complications which include CAD, CVA, CKD, retinopathy, and neuropathy. These are all discussed in detail with her.  - I have counseled her on diet management and weight loss, by adopting a carbohydrate restricted/protein rich diet. - she  admits there is a room for improvement in her diet and drink choices. -   Suggestion is made for her to avoid simple carbohydrates  from her diet including Cakes, Sweet Desserts / Pastries, Ice Cream, Soda (diet and regular), Sweet Tea, Candies, Chips, Cookies, Sweet Pastries,  Store Bought Juices, Alcohol in Excess of  1-2 drinks a day, Artificial Sweeteners, Coffee Creamer, and "Sugar-free" Products. This will help patient to have stable blood glucose profile and potentially avoid unintended weight gain.   - I encouraged her to switch to  unprocessed or minimally processed complex starch and increased protein intake (animal or plant source), fruits, and vegetables.  - she is advised to stick to a routine mealtimes to eat 3 meals  a day and avoid unnecessary snacks ( to snack only to correct hypoglycemia).   - she has been scheduled with Jearld Fenton, RDN, CDE for individualized diabetes education.  - I have approached her with the following individualized plan to manage diabetes and patient agrees:   -Given her presentation with chronic glycemic burden, she will likely require multiple daily injections of insulin in order for her to achieve and maintain control of diabetes to target.    -For this to happen, she has to commit for proper monitoring of blood glucose for safe use of insulin. In the meantime she is advised to increase her Lantus to 80 units nightly, associated with monitoring of blood glucose at least 2 times a day-before breakfast and daily before bedtime.   - she is encouraged to call clinic for blood glucose levels less than 70 or above 200 mg /dl. -She will be on a phone visit in 2 weeks.   -She is advised to continue Metformin 1000 mg p.o. twice daily-we will lower to 500 mg p.o. twice daily on subsequent repeats due to CKD, continue glipizide 10 mg p.o. daily at breakfast.   - she is warned not to take insulin without proper monitoring per orders.   2) Blood Pressure /Hypertension:  Her blood pressure is controlled to target.  she is advised  to continue her current medications including amlodipine 10 mg p.o. daily, carvedilol 12.5 mg p.o. twice daily, clonidine 0.3 mg/24-hour patch, clonidine 0.3 mg daily at bedtime, hydralazine 25 mg p.o. twice daily.  She will need work-up to rule out primary hypertension on subsequent visits.  3) Lipids/Hyperlipidemia:   Review of her recent lipid panel showed  controlled  LDL at 33.  she  is advised to continue Crestor 40 mg p.o. daily at bedtime.    Side effects and precautions discussed with her.  4)  Weight/Diet: Her current BMI is 62  - clearly complicating her diabetes care.  She is a candidate for modest weight loss.  She has multiple comorbid situations complicating her chance of bariatric surgery.  I discussed with her the fact that loss of 5 - 10% of her  current body weight will have the most impact on her diabetes management.  CDE Consult will be initiated . Exercise, and detailed carbohydrates information provided  -  detailed on discharge instructions.  5) hypothyroidism:   -She was recently initiated on levothyroxine currently 50 mcg p.o. daily before breakfast.  - We discussed about the correct intake of her thyroid hormone, on empty stomach at fasting, with water, separated by at least 30 minutes from breakfast and other medications,  and separated by more than 4 hours from calcium, iron, multivitamins, acid reflux medications (PPIs). -Patient is made aware of the fact that thyroid hormone replacement is needed for life, dose to be adjusted by periodic monitoring of thyroid function tests. adjusted by periodic monitoring of thyroid function tests.   6) Chronic Care/Health Maintenance:  -she  is on Statin medications and  is encouraged to initiate and continue to follow up with Ophthalmology, Dentist,  Podiatrist at least yearly or according to recommendations, and advised to  stay away from smoking. I have recommended yearly flu vaccine and pneumonia vaccine at least every 5 years;  moderate intensity exercise for up to 150 minutes weekly; and  sleep for at least 7 hours a day.  - she is  advised to maintain close follow up with Fayrene Helper, MD for primary care needs, as well as her other providers for optimal and coordinated care.   - Time spent on this patient care encounter:  45 min, of which > 50% was spent in  counseling and the rest reviewing her blood glucose logs , discussing her hypoglycemia and hyperglycemia episodes, reviewing her current and  previous labs / studies  ( including abstraction from other facilities) and medications  doses and developing a  long term treatment plan and documenting her care.   Please refer to Patient Instructions for Blood Glucose Monitoring and Insulin/Medications Dosing Guide"  in media tab for additional information. Please  also refer to " Patient Self Inventory" in the Media  tab for reviewed elements of pertinent patient history.  [place] participated in the discussions, expressed understanding, and voiced agreement with the above plans.  All questions were answered to her satisfaction. she is encouraged to contact clinic should she have any questions or concerns prior to her return visit.   Follow up plan: - Return in about 4 weeks (around 08/13/2019), or phone visit, for Follow up with [facility] and Logs Only - no Labs.  Glade Lloyd, MD [facility] [address] [place], [place] [address] Phone: [phone]  Fax: [phone]    07/16/2019, 6:01 PM  This note was partially dictated with voice recognition software. Similar sounding words can be transcribed inadequately or may not  be corrected upon review.

## 2019-07-16 NOTE — Patient Instructions (Addendum)

## 2019-07-16 NOTE — Progress Notes (Signed)
Pt here to see Dr. Dorris Fetch and myself today. She reports being SOB and not feeling well. Wants to reschedule visit.  Her daughter, whom his her caretaker is her with her today. She is very limited in her ability to walk. Exited in a wheelchair due to her left leg feeling week. Her daughter will call back to reschedule follow up appt.

## 2019-07-24 ENCOUNTER — Other Ambulatory Visit: Payer: Self-pay | Admitting: Family Medicine

## 2019-07-24 DIAGNOSIS — I10 Essential (primary) hypertension: Secondary | ICD-10-CM

## 2019-07-28 ENCOUNTER — Other Ambulatory Visit: Payer: Self-pay

## 2019-07-28 ENCOUNTER — Telehealth: Payer: Self-pay | Admitting: *Deleted

## 2019-07-28 ENCOUNTER — Ambulatory Visit (INDEPENDENT_AMBULATORY_CARE_PROVIDER_SITE_OTHER): Payer: Medicare Other | Admitting: Podiatry

## 2019-07-28 ENCOUNTER — Encounter: Payer: Self-pay | Admitting: Podiatry

## 2019-07-28 ENCOUNTER — Ambulatory Visit (INDEPENDENT_AMBULATORY_CARE_PROVIDER_SITE_OTHER): Payer: Medicare Other | Admitting: Internal Medicine

## 2019-07-28 ENCOUNTER — Encounter: Payer: Self-pay | Admitting: Internal Medicine

## 2019-07-28 VITALS — BP 118/74 | HR 88 | Temp 96.8°F | Ht 64.0 in | Wt 327.0 lb

## 2019-07-28 VITALS — Temp 97.2°F

## 2019-07-28 DIAGNOSIS — M79675 Pain in left toe(s): Secondary | ICD-10-CM | POA: Diagnosis not present

## 2019-07-28 DIAGNOSIS — L84 Corns and callosities: Secondary | ICD-10-CM

## 2019-07-28 DIAGNOSIS — E7800 Pure hypercholesterolemia, unspecified: Secondary | ICD-10-CM | POA: Diagnosis not present

## 2019-07-28 DIAGNOSIS — E1142 Type 2 diabetes mellitus with diabetic polyneuropathy: Secondary | ICD-10-CM

## 2019-07-28 DIAGNOSIS — B351 Tinea unguium: Secondary | ICD-10-CM

## 2019-07-28 DIAGNOSIS — I5032 Chronic diastolic (congestive) heart failure: Secondary | ICD-10-CM

## 2019-07-28 DIAGNOSIS — R0600 Dyspnea, unspecified: Secondary | ICD-10-CM

## 2019-07-28 DIAGNOSIS — Q828 Other specified congenital malformations of skin: Secondary | ICD-10-CM

## 2019-07-28 DIAGNOSIS — R0609 Other forms of dyspnea: Secondary | ICD-10-CM

## 2019-07-28 DIAGNOSIS — M79674 Pain in right toe(s): Secondary | ICD-10-CM

## 2019-07-28 DIAGNOSIS — E1159 Type 2 diabetes mellitus with other circulatory complications: Secondary | ICD-10-CM | POA: Diagnosis not present

## 2019-07-28 NOTE — Telephone Encounter (Signed)
Open error 

## 2019-07-28 NOTE — Patient Instructions (Signed)
Diabetes Mellitus and Foot Care Foot care is an important part of your health, especially when you have diabetes. Diabetes may cause you to have problems because of poor blood flow (circulation) to your feet and legs, which can cause your skin to:  Become thinner and drier.  Break more easily.  Heal more slowly.  Peel and crack. You may also have nerve damage (neuropathy) in your legs and feet, causing decreased feeling in them. This means that you may not notice minor injuries to your feet that could lead to more serious problems. Noticing and addressing any potential problems early is the best way to prevent future foot problems. How to care for your feet Foot hygiene  Wash your feet daily with warm water and mild soap. Do not use hot water. Then, pat your feet and the areas between your toes until they are completely dry. Do not soak your feet as this can dry your skin.  Trim your toenails straight across. Do not dig under them or around the cuticle. File the edges of your nails with an emery board or nail file.  Apply a moisturizing lotion or petroleum jelly to the skin on your feet and to dry, brittle toenails. Use lotion that does not contain alcohol and is unscented. Do not apply lotion between your toes. Shoes and socks  Wear clean socks or stockings every day. Make sure they are not too tight. Do not wear knee-high stockings since they may decrease blood flow to your legs.  Wear shoes that fit properly and have enough cushioning. Always look in your shoes before you put them on to be sure there are no objects inside.  To break in new shoes, wear them for just a few hours a day. This prevents injuries on your feet. Wounds, scrapes, corns, and calluses  Check your feet daily for blisters, cuts, bruises, sores, and redness. If you cannot see the bottom of your feet, use a mirror or ask someone for help.  Do not cut corns or calluses or try to remove them with medicine.  If you  find a minor scrape, cut, or break in the skin on your feet, keep it and the skin around it clean and dry. You may clean these areas with mild soap and water. Do not clean the area with peroxide, alcohol, or iodine.  If you have a wound, scrape, corn, or callus on your foot, look at it several times a day to make sure it is healing and not infected. Check for: ? Redness, swelling, or pain. ? Fluid or blood. ? Warmth. ? Pus or a bad smell. General instructions  Do not cross your legs. This may decrease blood flow to your feet.  Do not use heating pads or hot water bottles on your feet. They may burn your skin. If you have lost feeling in your feet or legs, you may not know this is happening until it is too late.  Protect your feet from hot and cold by wearing shoes, such as at the beach or on hot pavement.  Schedule a complete foot exam at least once a year (annually) or more often if you have foot problems. If you have foot problems, report any cuts, sores, or bruises to your health care provider immediately. Contact a health care provider if:  You have a medical condition that increases your risk of infection and you have any cuts, sores, or bruises on your feet.  You have an injury that is not   healing.  You have redness on your legs or feet.  You feel burning or tingling in your legs or feet.  You have pain or cramps in your legs and feet.  Your legs or feet are numb.  Your feet always feel cold.  You have pain around a toenail. Get help right away if:  You have a wound, scrape, corn, or callus on your foot and: ? You have pain, swelling, or redness that gets worse. ? You have fluid or blood coming from the wound, scrape, corn, or callus. ? Your wound, scrape, corn, or callus feels warm to the touch. ? You have pus or a bad smell coming from the wound, scrape, corn, or callus. ? You have a fever. ? You have a red line going up your leg. Summary  Check your feet every day  for cuts, sores, red spots, swelling, and blisters.  Moisturize feet and legs daily.  Wear shoes that fit properly and have enough cushioning.  If you have foot problems, report any cuts, sores, or bruises to your health care provider immediately.  Schedule a complete foot exam at least once a year (annually) or more often if you have foot problems. This information is not intended to replace advice given to you by your health care provider. Make sure you discuss any questions you have with your health care provider. Document Revised: 01/08/2019 Document Reviewed: 05/19/2016 Elsevier Patient Education  2020 [employer]  Corns and Calluses Corns are small areas of thickened skin that occur on the top, sides, or tip of a toe. They contain a cone-shaped core with a point that can press on a nerve below. This causes pain.  Calluses are areas of thickened skin that can occur anywhere on the body, including the hands, fingers, palms, soles of the feet, and heels. Calluses are usually larger than corns. What are the causes? Corns and calluses are caused by rubbing (friction) or pressure, such as from shoes that are too tight or do not fit properly. What increases the risk? Corns are more likely to develop in people who have misshapen toes (toe deformities), such as hammer toes. Calluses can occur with friction to any area of the skin. They are more likely to develop in people who:  Work with their hands.  Wear shoes that fit poorly, are too tight, or are high-heeled.  Have toe deformities. What are the signs or symptoms? Symptoms of a corn or callus include:  A hard growth on the skin.  Pain or tenderness under the skin.  Redness and swelling.  Increased discomfort while wearing tight-fitting shoes, if your feet are affected. If a corn or callus becomes infected, symptoms may include:  Redness and swelling that gets worse.  Pain.  Fluid, blood, or pus draining from the corn or  callus. How is this diagnosed? Corns and calluses may be diagnosed based on your symptoms, your medical history, and a physical exam. How is this treated? Treatment for corns and calluses may include:  Removing the cause of the friction or pressure. This may involve: ? Changing your shoes. ? Wearing shoe inserts (orthotics) or other protective layers in your shoes, such as a corn pad. ? Wearing gloves.  Applying medicine to the skin (topical medicine) to help soften skin in the hardened, thickened areas.  Removing layers of dead skin with a file to reduce the size of the corn or callus.  Removing the corn or callus with a scalpel or laser.  Taking   antibiotic medicines, if your corn or callus is infected.  Having surgery, if a toe deformity is the cause. Follow these instructions at home:   Take over-the-counter and prescription medicines only as told by your health care provider.  If you were prescribed an antibiotic, take it as told by your health care provider. Do not stop taking it even if your condition starts to improve.  Wear shoes that fit well. Avoid wearing high-heeled shoes and shoes that are too tight or too loose.  Wear any padding, protective layers, gloves, or orthotics as told by your health care provider.  Soak your hands or feet and then use a file or pumice stone to soften your corn or callus. Do this as told by your health care provider.  Check your corn or callus every day for symptoms of infection. Contact a health care provider if you:  Notice that your symptoms do not improve with treatment.  Have redness or swelling that gets worse.  Notice that your corn or callus becomes painful.  Have fluid, blood, or pus coming from your corn or callus.  Have new symptoms. Summary  Corns are small areas of thickened skin that occur on the top, sides, or tip of a toe.  Calluses are areas of thickened skin that can occur anywhere on the body, including the  hands, fingers, palms, and soles of the feet. Calluses are usually larger than corns.  Corns and calluses are caused by rubbing (friction) or pressure, such as from shoes that are too tight or do not fit properly.  Treatment may include wearing any padding, protective layers, gloves, or orthotics as told by your health care provider. This information is not intended to replace advice given to you by your health care provider. Make sure you discuss any questions you have with your health care provider. Document Revised: 08/07/2018 Document Reviewed: 02/28/2017 Elsevier Patient Education  2020 [employer]  Onychomycosis/Fungal Toenails  WHAT IS IT? An infection that lies within the keratin of your nail plate that is caused by a fungus.  WHY ME? Fungal infections affect all ages, sexes, races, and creeds.  There may be many factors that predispose you to a fungal infection such as age, coexisting medical conditions such as diabetes, or an autoimmune disease; stress, medications, fatigue, genetics, etc.  Bottom line: fungus thrives in a warm, moist environment and your shoes offer such a location.  IS IT CONTAGIOUS? Theoretically, yes.  You do not want to share shoes, nail clippers or files with someone who has fungal toenails.  Walking around barefoot in the same room or sleeping in the same bed is unlikely to transfer the organism.  It is important to realize, however, that fungus can spread easily from one nail to the next on the same foot.  HOW DO WE TREAT THIS?  There are several ways to treat this condition.  Treatment may depend on many factors such as age, medications, pregnancy, liver and kidney conditions, etc.  It is best to ask your doctor which options are available to you.  8. No treatment.   Unlike many other medical concerns, you can live with this condition.  However for many people this can be a painful condition and may lead to ingrown toenails or a bacterial infection.  It is  recommended that you keep the nails cut short to help reduce the amount of fungal nail. 9. Topical treatment.  These range from herbal remedies to prescription strength nail lacquers.  About 40-50%   effective, topicals require twice daily application for approximately 9 to 12 months or until an entirely new nail has grown out.  The most effective topicals are medical grade medications available through physicians offices. 10. Oral antifungal medications.  With an 80-90% cure rate, the most common oral medication requires 3 to 4 months of therapy and stays in your system for a year as the new nail grows out.  Oral antifungal medications do require blood work to make sure it is a safe drug for you.  A liver function panel will be performed prior to starting the medication and after the first month of treatment.  It is important to have the blood work performed to avoid any harmful side effects.  In general, this medication safe but blood work is required. 11. Laser Therapy.  This treatment is performed by applying a specialized laser to the affected nail plate.  This therapy is noninvasive, fast, and non-painful.  It is not covered by insurance and is therefore, out of pocket.  The results have been very good with a 80-95% cure rate.  [facility] is the only practice in the area to offer this therapy. 12. Permanent Nail Avulsion.  Removing the entire nail so that a new nail will not grow back. 

## 2019-07-28 NOTE — Progress Notes (Signed)
Subjective: Angela [name] presents today for follow up of preventative diabetic foot care, callus(es) plantar aspect left foot and painful mycotic toenails b/l that are difficult to trim. Pain interferes with ambulation. Aggravating factors include wearing enclosed shoe gear. Pain is relieved with periodic professional debridement. and painful plantar lesions right foot.  Pain prevent comfortable ambulation. Aggravating factor is weightbearing with or without shoegear.   She states she walks barefoot in her home. She is also seeing her Cardiologist on today.   Allergies  Allergen Reactions  . Morphine Other (See Comments)    Reaction with esophagus, unable to swallow.   . Ace Inhibitors Cough  . Aspirin Other (See Comments)    Reaction with esophagus, unable to swallow.   . Losartan Cough  . Tapentadol Other (See Comments)    Nausea, increased sleepiness, h/a  . Tomato Other (See Comments)    Acid reflux due to acid in tomato  . Adhesive [Tape] Rash     Objective: Vitals:   07/28/19 0817  Temp: (!) 97.2 F (36.2 C)    Pt is a 59 yo AAF, morbidly obese in NAD. AAO x 3.  Vascular Examination:  Capillary refill time to digits immediate b/l. Palpable DP pulses b/l. Palpable PT pulses b/l. Pedal hair sparse b/l. Skin temperature gradient within normal limits b/l.  Dermatological Examination: Pedal skin with normal turgor, texture and tone bilaterally. No open wounds bilaterally. No interdigital macerations bilaterally. Toenails 1-5 b/l elongated, dystrophic, thickened, crumbly with subungual debris and tenderness to dorsal palpation. Hyperkeratotic lesion(s) submet head 5 right foot.  No erythema, no edema, no drainage, no flocculence. Porokeratotic lesion(s) submet head 5 left foot and plantar aspect right heel. No erythema, no edema, no drainage, no flocculence.  Musculoskeletal: Normal muscle strength 5/5 to all lower extremity muscle groups bilaterally, no pain crepitus or joint  limitation noted with ROM b/l, bunion deformity noted b/l and utilizes cane for ambulation assistance  Neurological: Protective sensation diminished with 10g monofilament b/l.  Assessment: 1. Pain due to onychomycosis of toenails of both feet   2. Porokeratosis   3. Callus   4. Diabetic peripheral neuropathy associated with type 2 diabetes mellitus ([place])    Plan: -Continue diabetic foot care principles. Literature dispensed on today.  -Toenails 1-5 b/l were debrided in length and girth with sterile nail nippers and dremel without iatrogenic bleeding.  -Callus(es) submet head 5 right foot were debrided without complication or incident. Total number debrided =1. -Painful porokeratotic lesion(s) submet head 5 left foot and plantar aspect right heel pared and enucleated with sterile scalpel blade without incident. -Patient to continue soft, supportive shoe gear daily. -Patient to report any pedal injuries to medical professional immediately. -Patient/POA to call should there be question/concern in the interim.  Return in about 3 months (around 10/28/2019) for diabetic nail and callus trim.

## 2019-07-28 NOTE — Progress Notes (Signed)
Cardiology Office Note:    Date:  07/28/2019   ID:  Angela [name], DOB [birth year], MRN [id number]  PCP:  Fayrene Helper, MD  Cardiologist:  Elouise Munroe, MD  Electrophysiologist:  None   Referring MD: Fayrene Helper, MD   Chief Complaint: f/u dyspnea  History of Present Illness:    Angela [name] is a 59 y.o. female well known to my clinic.  She tells me that her LFTs were recently abnormal and her statin was stopped.  Of note she had a coronary angiogram in January 2020 with no obstructive CAD.  We reviewed the results from her right heart catheterization which demonstrated no evidence of pulmonary hypertension, relatively normal right heart pressures with moderately elevated pulmonary capillary wedge pressure and hyperdynamic cardiac output likely related to obesity.  Wedge pressure was 17 mmHg suggestive of increased LV filling pressure.  I have encouraged her to continue to take her diuretic as this will be be best way to control this as well as good blood pressure control.  She had very hard to be compliant with her diuretic and since starting magnesium has had less lower extremity cramping.  Past Medical History:  Diagnosis Date  . Anxiety   . Arthritis   . Asthma   . Coronary atherosclerosis of native coronary artery    Mild nonobstructive CAD by cardiac catheterization 2008 - Dr. Terrence Dupont  . Degenerative disc disease   . Depression   . Dysrhythmia   . Essential hypertension, benign   . Folliculitis 12/01/2120  . GERD (gastroesophageal reflux disease)   . H/O hiatal hernia   . Helicobacter pylori gastritis 2008  . Hypercholesterolemia   . Hypertension   . Hyperthyroidism    s/p radiation  . Left-sided epistaxis 05/15/2018  . Low back pain   . Migraine   . Nephrolithiasis    Recurring episodes since 2004  . Severe obesity (BMI >= 40) (HCC)   . Sleep apnea    STOP BANG SCORE 6no cpap used  . Type 2 diabetes mellitus with diabetic neuropathy [facility])      Past Surgical History:  Procedure Laterality Date  . ABDOMINAL HYSTERECTOMY    . BACK SURGERY  1993  . BACK SURGERY  1994  . BACK SURGERY  2002  . BACK SURGERY  2011   [place] Left   . CHOLECYSTECTOMY  1996  . COLONOSCOPY  2000 [facility]   INT HEMORRHOIDS/FISSURE  . COLONOSCOPY  2003 [facility], CHANGE IN BOWEL HABITS   INT HEMORRHOIDS  . COLONOSCOPY  2006 [facility]   INT HEMORRHOIDS  . COLONOSCOPY  2007 [facility]   INT HEMORRHOIDS  . CYSTOSCOPY WITH STENT PLACEMENT Left 07/25/2012   Procedure: CYSTOSCOPY, left retograde pyelogram WITH left ureteral  STENT PLACEMENT;  Surgeon: Claybon Jabs, MD;  Location: [facility];  Service: Urology;  Laterality: Left;  . CYSTOSCOPY/RETROGRADE/URETEROSCOPY  12/04/2011   Procedure: CYSTOSCOPY/RETROGRADE/URETEROSCOPY;  Surgeon: Molli Hazard, MD;  Location: [facility];  Service: Urology;  Laterality: Right;  . CYSTOSCOPY/RETROGRADE/URETEROSCOPY/STONE EXTRACTION WITH BASKET Left 09/03/2012   Procedure: CYSTOSCOPY/RETROGRADE pyelogram/digital URETEROSCOPY/STONE EXTRACTION WITH BASKET, left stent removal;  Surgeon: Molli Hazard, MD;  Location: [facility];  Service: Urology;  Laterality: Left;  . ESOPHAGOGASTRODUODENOSCOPY  2008   [facility]:NOIBB hiatal hernia./Normal esophagus without evidence of Barrett's mass, stricture, erosion or ulceration./Normal duodenal bulb and second portion of the duodenum./Diffuse erythema in the body and the antrum with occasional erosion.  Biopsies obtained via cold forceps  to evaluate for H. pylori gastritis  . FRACTURE SURGERY  1999   right clavicle  . HOLMIUM LASER APPLICATION Left 11/29/8561   Procedure: HOLMIUM LASER APPLICATION;  Surgeon: Molli Hazard, MD;  Location: [facility];  Service: Urology;  Laterality: Left;  . KNEE ARTHROSCOPY  10/04/2010,    right knee arthroscopy, dr Theda Sers  . LEFT HEART CATH AND CORONARY ANGIOGRAPHY N/A 05/08/2018   Procedure: LEFT HEART CATH AND CORONARY ANGIOGRAPHY;   Surgeon: Troy Sine, MD;  Location: [place] [facility];  Service: Cardiovascular;  Laterality: N/A;  . left knee arthroscopic surgery  1999  . Left salphingectomy secondary to ectopic pregnancy  1991  . PARTIAL HYSTERECTOMY  1991  . RIGHT HEART CATH N/A 07/07/2019   Procedure: RIGHT HEART CATH;  Surgeon: Leonie Man, MD;  Location: [place] [facility];  Service: Cardiovascular;  Laterality: N/A;  . rotary cuff  Right 05/14/2014   [facility]  . UPPER GASTROINTESTINAL ENDOSCOPY  2008 abd pain   H. pylori gastritis  . UPPER GASTROINTESTINAL ENDOSCOPY  1996 [place]   PUD    Current Medications: Current Meds  Medication Sig  . albuterol (VENTOLIN HFA) 108 (90 Base) MCG/ACT inhaler Inhale 2 puffs into the lungs every 6 (six) hours as needed for wheezing or shortness of breath.  Marland Kitchen amLODipine (NORVASC) 10 MG tablet TAKE 1 TABLET BY MOUTH EVERY DAY  . B-D UF III MINI PEN NEEDLES 31G X 5 MM MISC USE ONCE DAILY AS DIRECTED  . betamethasone, augmented, (DIPROLENE) 0.05 % lotion   . blood glucose meter kit and supplies Dispense based on patient and insurance preference. Once daily testing dx. e11.9  . calcipotriene (DOVONOX) 0.005 % ointment Apply 1 application topically daily.   . cloNIDine (CATAPRES - DOSED IN MG/24 HR) 0.3 mg/24hr patch APPLY 1 PATCH TO SKIN ONCE A WEEK (Patient taking differently: Place 0.3 mg onto the skin every Tuesday. APPLY 1 PATCH TO SKIN ONCE A WEEK)  . cloNIDine (CATAPRES) 0.3 MG tablet Take 1 tablet (0.3 mg total) by mouth at bedtime.  . cyclobenzaprine (FLEXERIL) 10 MG tablet TAKE 1 TABLET BY MOUTH THREE TIMES A DAY  . diazepam (VALIUM) 5 MG tablet Take one tablet by mouth three times daily for back spasm  . DULoxetine (CYMBALTA) 60 MG capsule TAKE 1 CAPSULE BY MOUTH TWICE A DAY (Patient taking differently: Take 60 mg by mouth 2 (two) times daily. )  . ergocalciferol (VITAMIN D2) 1.25 MG (50000 UT) capsule Take 1 capsule (50,000 Units total) by mouth once a week.  One capsule once weekly (Patient taking differently: Take 50,000 Units by mouth every Monday. )  . esomeprazole (NEXIUM) 40 MG capsule TAKE 1 CAPSULE BY MOUTH EVERY DAY (Patient taking differently: Take 40 mg by mouth daily before breakfast. )  . fluconazole (DIFLUCAN) 150 MG tablet Take one tablet by mouth once daily, as needed, for vaginal itch associated with whig dose prednisone and diabetes  . fluticasone (FLONASE) 50 MCG/ACT nasal spray Place 2 sprays into both nostrils daily. (Patient taking differently: Place 2 sprays into both nostrils daily as needed for allergies. )  . gabapentin (NEURONTIN) 800 MG tablet TAKE 1 TABLET BY MOUTH FOUR TIMES A DAY (Patient taking differently: Take 800 mg by mouth in the morning, at noon, in the evening, and at bedtime. )  . glipiZIDE (GLUCOTROL XL) 10 MG 24 hr tablet Take 1 tablet (10 mg total) by mouth daily with breakfast.  . hydrOXYzine (ATARAX/VISTARIL) 10  MG tablet Take 10 mg by mouth in the morning, at noon, and at bedtime.   . Insulin Glargine (LANTUS SOLOSTAR) 100 UNIT/ML Solostar Pen Inject 80 Units into the skin at bedtime.  Marland Kitchen ipratropium-albuterol (DUONEB) 0.5-2.5 (3) MG/3ML SOLN Take 3 mLs by nebulization every 6 (six) hours as needed. (Patient taking differently: Take 3 mLs by nebulization in the morning, at noon, and at bedtime. )  . KLOR-CON M20 20 MEQ tablet TAKE 1 TABLET BY MOUTH TWICE A DAY (Patient taking differently: Take 20 mEq by mouth 2 (two) times daily. )  . Lancets (ONETOUCH DELICA PLUS ZOXWRU04V) MISC TEST ONCE DAILY  . levothyroxine (SYNTHROID) 50 MCG tablet TAKE 1 TABLET BY MOUTH DAILY BEFORE BREAKFAST (Patient taking differently: Take 50 mcg by mouth daily before breakfast. )  . loratadine (CLARITIN) 10 MG tablet Take 1 tablet (10 mg total) by mouth daily.  . Magnesium Oxide 400 MG CAPS Take one (Patient taking differently: Take 400 mg by mouth at bedtime. Take one)  . meclizine (ANTIVERT) 25 MG tablet Take 25 mg by mouth in the  morning, at noon, and at bedtime.   . metFORMIN (GLUCOPHAGE) 1000 MG tablet Take 1 tablet (1,000 mg total) by mouth 2 (two) times daily.  . metoprolol tartrate (LOPRESSOR) 25 MG tablet Take 1 tablet (25 mg total) by mouth 2 (two) times daily.  . montelukast (SINGULAIR) 10 MG tablet TAKE 1 TABLET (10 MG TOTAL) BY MOUTH AT BEDTIME.  . nitroGLYCERIN (NITROSTAT) 0.4 MG SL tablet Place 1 tablet (0.4 mg total) under the tongue every 5 (five) minutes as needed for chest pain.  Marland Kitchen ondansetron (ZOFRAN) 4 MG tablet Take 1 tablet (4 mg total) by mouth every 8 (eight) hours as needed for nausea or vomiting.  Glory Rosebush VERIO test strip USE AS INSTRUCTED FOUR TIMES DAILY TESTING DX E11.65  . [EXPIRED] oxyCODONE (OXYCONTIN) 40 mg 12 hr tablet Take one tablet by mouth two times daily by mouth for chronic pain  . oxyCODONE (OXYCONTIN) 40 mg 12 hr tablet Take 1 tablet (40 mg total) by mouth every 12 (twelve) hours.  Derrill Memo ON 09/10/2019] oxyCODONE (OXYCONTIN) 40 mg 12 hr tablet Take 1 tablet (40 mg total) by mouth every 12 (twelve) hours.  . rosuvastatin (CRESTOR) 40 MG tablet Take 40 mg by mouth daily.  . SYMBICORT 160-4.5 MCG/ACT inhaler TAKE 2 PUFFS BY MOUTH TWICE A DAY (Patient taking differently: Inhale 2 puffs into the lungs in the morning and at bedtime. )  . triamterene-hydrochlorothiazide (MAXZIDE) 75-50 MG tablet TAKE 1 TABLET BY MOUTH EVERY DAY  . UNABLE TO FIND Glucerna 114 U/M or PRN Chux 90/M or PRN Incontinence pads 198/M or PRN  Tub Mat x 1  Grab bars  . UNABLE TO FIND Reacher/Grabber x 1  Lift chair x 1  Lifeline X1  . zolpidem (AMBIEN) 10 MG tablet Take 1 tablet (10 mg total) by mouth at bedtime.  Marland Kitchen zolpidem (AMBIEN) 10 MG tablet Take 1 tablet (10 mg total) by mouth at bedtime.     Allergies:   Morphine, Ace inhibitors, Aspirin, Losartan, Tapentadol, Tomato, and Adhesive [tape]   Social History   Socioeconomic History  . Marital status: Single    Spouse name: Not on file  . Number of  children: 1  . Years of education: 32  . Highest education level: 12th grade  Occupational History  . Occupation: Disable   Tobacco Use  . Smoking status: Never Smoker  . Smokeless tobacco: Never Used  Substance and Sexual Activity  . Alcohol use: No  . Drug use: No  . Sexual activity: Not Currently    Partners: Male    Birth control/protection: Surgical    Comment: hyst  Other Topics Concern  . Not on file  Social History Narrative   Lives alone    Social Determinants of Health   Financial Resource Strain:   . Difficulty of Paying Living Expenses:   Food Insecurity:   . Worried About Charity fundraiser in the Last Year:   . Arboriculturist in the Last Year:   Transportation Needs:   . Film/video editor (Medical):   Marland Kitchen Lack of Transportation (Non-Medical):   Physical Activity:   . Days of Exercise per Week:   . Minutes of Exercise per Session:   Stress:   . Feeling of Stress :   Social Connections:   . Frequency of Communication with Friends and Family:   . Frequency of Social Gatherings with Friends and Family:   . Attends Religious Services:   . Active Member of Clubs or Organizations:   . Attends Archivist Meetings:   Marland Kitchen Marital Status:      Family History: The patient's family history includes Asthma in her mother; Cancer in her father and mother; Colon cancer in her father; Heart attack in her father; Heart disease in her mother; Hypertension in her mother. There is no history of Colon polyps.  ROS:   Please see the history of present illness.    All other systems reviewed and are negative.  EKGs/Labs/Other Studies Reviewed:    The following studies were reviewed today:  EKG:  Not performed today  Recent Labs: 06/05/2019: NT-Pro BNP 194 06/25/2019: ALT 138; TSH 1.75 07/03/2019: BUN 22; Creatinine, Ser 1.15; Platelets 389 07/07/2019: Hemoglobin 12.6; Potassium 4.0; Sodium 139  Recent Lipid Panel    Component Value Date/Time   CHOL 91  06/25/2019 1225   TRIG 115 06/25/2019 1225   HDL 46 (L) 06/25/2019 1225   CHOLHDL 2.0 06/25/2019 1225   VLDL 16 05/06/2018 0335   LDLCALC 25 06/25/2019 1225    Physical Exam:    VS:  BP 118/74 (BP Location: Left Wrist, Patient Position: Sitting, Cuff Size: Large)   Pulse 88   Temp (!) 96.8 F (36 C)   Ht '5\' 4"'$  (1.626 m)   Wt (!) 327 lb (148.3 kg)   BMI 56.13 kg/m     Wt Readings from Last 5 Encounters:  07/28/19 (!) 327 lb (148.3 kg)  07/16/19 (!) 327 lb 9.6 oz (148.6 kg)  07/07/19 (!) 336 lb (152.4 kg)  06/26/19 (!) 323 lb 3.2 oz (146.6 kg)  06/25/19 (!) 325 lb (147.4 kg)     Constitutional: No acute distress Eyes: sclera non-icteric, normal conjunctiva and lids ENMT: normal dentition, moist mucous membranes Cardiovascular: regular rhythm, normal rate, no murmurs. S1 and S2 normal. Radial pulses normal bilaterally. No jugular venous distention.  Respiratory: clear to auscultation bilaterally GI : normal bowel sounds, soft and nontender. No distention.   MSK: extremities warm, well perfused. No edema.  NEURO: grossly nonfocal exam, moves all extremities. PSYCH: alert and oriented x 3, normal mood and affect.   ASSESSMENT:    1. DOE (dyspnea on exertion)   2. Chronic diastolic heart failure ([place])   3. Hypercholesterolemia   4. Type 2 diabetes mellitus with vascular disease ([place])   5. Morbid obesity ([place])    PLAN:    DOE (dyspnea  on exertion) Chronic diastolic heart failure (HCC)  -findings of right heart cath and echo suggest mild diastolic heart failure with no evidence of pulmonary hypertension.  Dyspnea seems multifactorial and contributed to by obesity, increased LV filling pressure, and sleep apnea.  She will need to continue to follow with pulmonary medicine and will need to continue on her diuretic which seems to help with shortness of breath.  Magnesium supplementation as tolerated to avoid cramping in her legs.  Hypercholesterolemia-statin recently stopped  due to abnormal LFTs per patient, lipids are under good control at this time with no significant coronary artery disease, continue to monitor.  Will need to consider statin therapy going forward given type 2 diabetes.  Type 2 diabetes mellitus with vascular disease (HCC)-per PCP  Morbid obesity (HCC)-encouraged the patient to begin a walking program to benefit her conditioning status as well as weight loss, and hopefully benefit her shortness of breath.  She is significantly short of breath with activity, however this may be significantly contributed to by deconditioning.  Recommending as tolerated.    Total time of encounter: 30 minutes total time of encounter, including 20 minutes spent in face-to-face patient care on the date of this encounter. This time includes coordination of care and counseling regarding above mentioned problem list. Remainder of non-face-to-face time involved reviewing chart documents/testing relevant to the patient encounter and documentation in the medical record. I have independently reviewed documentation from referring provider.   Cherlynn Kaiser, MD [place]  [facility]    Medication Adjustments/Labs and Tests Ordered: Current medicines are reviewed at length with the patient today.  Concerns regarding medicines are outlined above.  No orders of the defined types were placed in this encounter.  No orders of the defined types were placed in this encounter.   Patient Instructions  Medication Instructions:  No changes  If you need a refill on your cardiac medications before your next appointment, please call your pharmacy*   Lab Work: Not needed   Testing/Procedures: Not needed   Follow-Up: At [facility], you and your health needs are our priority.  As part of our continuing mission to provide you with exceptional heart care, we have created designated Provider Care Teams.  These Care Teams include your primary Cardiologist (physician) and  Advanced Practice Providers (APPs -  Physician Assistants and Nurse Practitioners) who all work together to provide you with the care you need, when you need it.    Your next appointment:   3 month(s)  The format for your next appointment:   Virtual Visit   Provider:   Cherlynn Kaiser, MD   Other Instructions

## 2019-07-28 NOTE — Patient Instructions (Signed)
Medication Instructions:  No changes  If you need a refill on your cardiac medications before your next appointment, please call your pharmacy*   Lab Work: Not needed   Testing/Procedures: Not needed   Follow-Up: At [facility], you and your health needs are our priority.  As part of our continuing mission to provide you with exceptional heart care, we have created designated Provider Care Teams.  These Care Teams include your primary Cardiologist (physician) and Advanced Practice Providers (APPs -  Physician Assistants and Nurse Practitioners) who all work together to provide you with the care you need, when you need it.    Your next appointment:   3 month(s)  The format for your next appointment:   Virtual Visit   Provider:   Cherlynn Kaiser, MD   Other Instructions

## 2019-08-03 DIAGNOSIS — I2510 Atherosclerotic heart disease of native coronary artery without angina pectoris: Secondary | ICD-10-CM | POA: Diagnosis not present

## 2019-08-03 DIAGNOSIS — I10 Essential (primary) hypertension: Secondary | ICD-10-CM | POA: Diagnosis not present

## 2019-08-03 DIAGNOSIS — E119 Type 2 diabetes mellitus without complications: Secondary | ICD-10-CM | POA: Diagnosis not present

## 2019-08-04 ENCOUNTER — Other Ambulatory Visit: Payer: Self-pay

## 2019-08-04 ENCOUNTER — Other Ambulatory Visit (HOSPITAL_COMMUNITY)
Admission: RE | Admit: 2019-08-04 | Discharge: 2019-08-04 | Disposition: A | Discharge status: Home / Self Care | Payer: Medicare Other | Source: Ambulatory Visit | Attending: Pulmonary Disease | Admitting: Pulmonary Disease

## 2019-08-04 DIAGNOSIS — Z01812 Encounter for preprocedural laboratory examination: Secondary | ICD-10-CM | POA: Diagnosis not present

## 2019-08-04 DIAGNOSIS — Z20822 Contact with and (suspected) exposure to covid-19: Secondary | ICD-10-CM | POA: Insufficient documentation

## 2019-08-04 LAB — SARS CORONAVIRUS 2 (TAT 6-24 HRS): SARS Coronavirus 2: NEGATIVE

## 2019-08-06 NOTE — Progress Notes (Signed)
Virtual Visit via Telephone Note  I connected with [place] on 08/07/19 at  3:30 PM EDT by telephone and verified that I am speaking with the correct person using two identifiers.  Location: Patient: Home Provider: Office [facility] - [id number], [place], [place], [place] [address]   I discussed the limitations, risks, security and privacy concerns of performing an evaluation and management service by telephone and the availability of in person appointments. I also discussed with the patient that there may be a patient responsible charge related to this service. The patient expressed understanding and agreed to proceed.  Patient consented to consult via telephone: Yes People present and their role in pt care: Pt   History of Present Illness:  59 year old female never smoker reconsulted with our practice in January/2021 for dyspnea on exertion / Asthma   PMH: Hypertension, chronic pain, obstructive sleep apnea (managed by neurology) Smoker/ Smoking History: Never smoker Maintenance:  none Pt of: Dr. Elsworth Soho   Chief complaint:    59 year old female never smoker completing a televisit with our office today after completing pulmonary function testing.  Pulmonary function testing results listed below:  08/07/2019-pulmonary function test-FVC 2.34 (87% predicted), postbronchodilator ratio 82, postbronchodilator FEV1 2.23 (105% predicted), positive bronchodilator response, DLCO 25.78 (126% predicted) Patient did not use any inhalers prior to this exam  Patient reports that she continues to be adherent to her diuretics.  She feels that she is urinating more.  Weight today is 328 pounds.  Weights appear to be stable per chart review.  Patient continues to be compliant with CPAP therapy.  Patient is reporting she is had ongoing cramping since starting her diuretics.  She reports she is eating bananas.  She has not followed up with primary care regarding her leg  cramping.   Observations/Objective:  Cardiac cath 05/2018 no significant CAD, LVEDP 14  Echo 10/01/13 >> EF 60 to [id number], grade 1 diastolic dysfx, mild LVH  Echo [id number] grade 2 diastolic dysfunction, normal LVEF  2015 normal spirometry 01/2016 split-night study-AHI 22/hour, CPAP 10 cm  05/06/2018-CBC with differential-eosinophils relative 2, eosinophils absolute 0.2  07/07/2019-right heart cath-no evidence of pulmonary hypertension, suspect noncardiac etiology for dyspnea although diastolic dysfunction component is also possible, restart home dose diuretic, continue CPAP, weight loss  07/09/2019-echocardiogram-LV ejection fraction 60 to 65%, impaired relaxation  Social History   Tobacco Use  Smoking Status Never Smoker  Smokeless Tobacco Never Used   Immunization History  Administered Date(s) Administered  . Influenza Split 03/30/2011  . Influenza Whole 03/21/2005, 02/09/2006  . Influenza,inj,Quad PF,6+ Mos 02/11/2014, 07/27/2015, 03/09/2016, 01/29/2017, 02/07/2018, 01/30/2019  . Influenza-Unspecified 01/29/2017  . Pneumococcal Polysaccharide-23 04/17/2006, 09/14/2015  . Td 04/17/2006      Assessment and Plan:  Chronic diastolic heart failure ([place]) Plan: Continue to work with cardiology Continue follow-up with primary care Notify primary care of cramping with diuretics Continue diuretics  Sleep apnea syndrome Plan: Continue CPAP therapy  Asthma Pulmonary function testing today shows positive bronchodilator response, hyperinflated DLCO Likely asthma No restrictive lung disease  Plan: Continue Symbicort 160 Follow-up with Dr. Elsworth Soho in 3 months  DOE (dyspnea on exertion) This dyspnea with exertion likely continues to be multifactorial.  Patient with morbid obesity, no known restrictive lung disease, likely asthma, also likely component of diastolic heart failure.  Plan: Continue Symbicort 160 Continue to work on increasing physical activity Continue  diuretics   Morbid obesity ([place]) Plan: Continue to work diligently on reducing BMI   Follow Up Instructions:  Return in about 3 months (around 11/06/2019), or if symptoms worsen or fail to improve, for Follow up with Dr. Elsworth Soho.   I discussed the assessment and treatment plan with the patient. The patient was provided an opportunity to ask questions and all were answered. The patient agreed with the plan and demonstrated an understanding of the instructions.   The patient was advised to call back or seek an in-person evaluation if the symptoms worsen or if the condition fails to improve as anticipated.  I provided 22 minutes of non-face-to-face time during this encounter.   Lauraine Rinne, NP

## 2019-08-07 ENCOUNTER — Other Ambulatory Visit: Payer: Self-pay

## 2019-08-07 ENCOUNTER — Ambulatory Visit (INDEPENDENT_AMBULATORY_CARE_PROVIDER_SITE_OTHER): Payer: Medicare Other | Admitting: Pulmonary Disease

## 2019-08-07 ENCOUNTER — Encounter: Payer: Self-pay | Admitting: Pulmonary Disease

## 2019-08-07 ENCOUNTER — Telehealth: Payer: Self-pay | Admitting: Internal Medicine

## 2019-08-07 DIAGNOSIS — J4530 Mild persistent asthma, uncomplicated: Secondary | ICD-10-CM

## 2019-08-07 DIAGNOSIS — I5032 Chronic diastolic (congestive) heart failure: Secondary | ICD-10-CM

## 2019-08-07 DIAGNOSIS — R0609 Other forms of dyspnea: Secondary | ICD-10-CM

## 2019-08-07 DIAGNOSIS — G4733 Obstructive sleep apnea (adult) (pediatric): Secondary | ICD-10-CM | POA: Diagnosis not present

## 2019-08-07 DIAGNOSIS — R0600 Dyspnea, unspecified: Secondary | ICD-10-CM

## 2019-08-07 LAB — PULMONARY FUNCTION TEST
DL/VA % pred: 145 %
DL/VA: 6.14 ml/min/mmHg/L
DLCO cor % pred: 132 %
DLCO cor: 26.82 ml/min/mmHg
DLCO unc % pred: 126 %
DLCO unc: 25.78 ml/min/mmHg
FEF 25-75 Post: 3.11 L/sec
FEF 25-75 Pre: 2.22 L/sec
FEF2575-%Change-Post: 40 %
FEF2575-%Pred-Post: 147 %
FEF2575-%Pred-Pre: 105 %
FEV1-%Change-Post: 14 %
FEV1-%Pred-Post: 105 %
FEV1-%Pred-Pre: 91 %
FEV1-Post: 2.23 L
FEV1-Pre: 1.94 L
FEV1FVC-%Change-Post: -1 %
FEV1FVC-%Pred-Pre: 104 %
FEV6-%Change-Post: 17 %
FEV6-%Pred-Post: 105 %
FEV6-%Pred-Pre: 89 %
FEV6-Post: 2.73 L
FEV6-Pre: 2.33 L
FEV6FVC-%Change-Post: 0 %
FEV6FVC-%Pred-Post: 103 %
FEV6FVC-%Pred-Pre: 103 %
FVC-%Change-Post: 16 %
FVC-%Pred-Post: 101 %
FVC-%Pred-Pre: 87 %
FVC-Post: 2.73 L
Post FEV1/FVC ratio: 82 %
Post FEV6/FVC ratio: 100 %
Pre FEV1/FVC ratio: 83 %
Pre FEV6/FVC Ratio: 100 %
RV % pred: 89 %
RV: 1.76 L
TLC % pred: 87 %
TLC: 4.42 L

## 2019-08-07 NOTE — Assessment & Plan Note (Signed)
Plan: Continue CPAP therapy 

## 2019-08-07 NOTE — Telephone Encounter (Signed)
Patient calling because she saw pulmonology today and they told her to call her cardiology office about her cramping that she was having in her legs due to her maxzide. After talking to patient, she has edema in left leg only, with at knot above her ankle and having sharp pain on the back side of her left knee. Will forward to Dr. Margaretann Loveless for advisement.

## 2019-08-07 NOTE — Assessment & Plan Note (Signed)
This dyspnea with exertion likely continues to be multifactorial.  Patient with morbid obesity, no known restrictive lung disease, likely asthma, also likely component of diastolic heart failure.  Plan: Continue Symbicort 160 Continue to work on increasing physical activity Continue diuretics

## 2019-08-07 NOTE — Assessment & Plan Note (Signed)
Plan: Continue to work diligently on reducing BMI

## 2019-08-07 NOTE — Progress Notes (Signed)
Called and discussed results with patient over the phone.Continue Symbicort 160Nothing further needed.Wyn Quaker, FNP

## 2019-08-07 NOTE — Telephone Encounter (Signed)
New message  Pt c/o medication issue:  1. Name of Medication: triamterene-hydrochlorothiazide (MAXZIDE) 75-50 MG tablet  2. How are you currently taking this medication (dosage and times per day)? As directed  3. Are you having a reaction (difficulty breathing--STAT)? Cramps all over  4. What is your medication issue? Patient is calling in about medication. States that she is cramping all over and her pulmonary doctor told her to reach out to her cardiologist about it. Please give patient a call back to assist.

## 2019-08-07 NOTE — Patient Instructions (Addendum)
You were seen today by Lauraine Rinne, NP  for:  1. Chronic diastolic heart failure (HCC)  Continue diuretics  Contact primary care regarding the cramping in her legs  weigh yourself daily  Keep follow-up with cardiology and primary care  2. Obstructive sleep apnea syndrome  Continue CPAP use  3. Mild persistent asthma, unspecified whether complicated 4. DOE (dyspnea on exertion)  Continue Symbicort 160 >>> 2 puffs in the morning right when you wake up, rinse out your mouth after use, 12 hours later 2 puffs, rinse after use >>> Take this daily, no matter what >>> This is not a rescue inhaler   5. Morbid obesity ([place])  Follow Up:    Return in about 3 months (around 11/06/2019), or if symptoms worsen or fail to improve, for Follow up with Dr. Elsworth Soho.   Please do your part to reduce the spread of COVID-19:      Reduce your risk of any infection  and COVID19 by using the similar precautions used for avoiding the common cold or flu:  Marland Kitchen Wash your hands often with soap and warm water for at least 20 seconds.  If soap and water are not readily available, use an alcohol-based hand sanitizer with at least 60% alcohol.  . If coughing or sneezing, cover your mouth and nose by coughing or sneezing into the elbow areas of your shirt or coat, into a tissue or into your sleeve (not your hands). Langley Gauss A MASK when in public  . Avoid shaking hands with others and consider head nods or verbal greetings only. . Avoid touching your eyes, nose, or mouth with unwashed hands.  . Avoid close contact with people who are sick. . Avoid places or events with large numbers of people in one location, like concerts or sporting events. . If you have some symptoms but not all symptoms, continue to monitor at home and seek medical attention if your symptoms worsen. . If you are having a medical emergency, call 911.   [place] / e-Visit:  [email or web address]         [name]: [facility]: [id number]                   [facility]: [id number]     It is flu season:   >>> Best ways to protect herself from the flu: Receive the yearly flu vaccine, practice good hand hygiene washing with soap and also using hand sanitizer when available, eat a nutritious meals, get adequate rest, hydrate appropriately   Please contact the office if your symptoms worsen or you have concerns that you are not improving.   Thank you for choosing [facility] for your healthcare, and for allowing [place] to partner with you on your healthcare journey. I am thankful to be able to provide care to you today.   Wyn Quaker FNP-C

## 2019-08-07 NOTE — Assessment & Plan Note (Signed)
Pulmonary function testing today shows positive bronchodilator response, hyperinflated DLCO Likely asthma No restrictive lung disease  Plan: Continue Symbicort 160 Follow-up with Dr. Elsworth Soho in 3 months

## 2019-08-07 NOTE — Assessment & Plan Note (Signed)
Plan: Continue to work with cardiology Continue follow-up with primary care Notify primary care of cramping with diuretics Continue diuretics

## 2019-08-08 LAB — PULMONARY FUNCTION TEST
DL/VA % pred: 145 %
DL/VA: 6.14 ml/min/mmHg/L
DLCO cor % pred: 132 %
DLCO cor: 26.82 ml/min/mmHg
DLCO unc % pred: 126 %
DLCO unc: 25.78 ml/min/mmHg
FEF 25-75 Post: 3.11 L/sec
FEF 25-75 Pre: 2.22 L/sec
FEF2575-%Change-Post: 40 %
FEF2575-%Pred-Post: 147 %
FEF2575-%Pred-Pre: 105 %
FEV1-%Change-Post: 14 %
FEV1-%Pred-Post: 105 %
FEV1-%Pred-Pre: 91 %
FEV1-Post: 2.23 L
FEV1-Pre: 1.94 L
FEV1FVC-%Change-Post: -1 %
FEV1FVC-%Pred-Pre: 104 %
FEV6-%Change-Post: 17 %
FEV6-%Pred-Post: 105 %
FEV6-%Pred-Pre: 89 %
FEV6-Post: 2.73 L
FEV6-Pre: 2.33 L
FEV6FVC-%Change-Post: 0 %
FEV6FVC-%Pred-Post: 103 %
FEV6FVC-%Pred-Pre: 103 %
FVC-%Change-Post: 16 %
FVC-%Pred-Post: 101 %
FVC-%Pred-Pre: 87 %
FVC-Post: 2.73 L
FVC-Pre: 2.34 L
Post FEV1/FVC ratio: 82 %
Post FEV6/FVC ratio: 100 %
Pre FEV1/FVC ratio: 83 %
Pre FEV6/FVC Ratio: 100 %
RV % pred: 89 %
RV: 1.76 L
TLC % pred: 87 %
TLC: 4.42 L

## 2019-08-11 ENCOUNTER — Other Ambulatory Visit: Payer: Self-pay

## 2019-08-11 ENCOUNTER — Encounter: Payer: Medicare Other | Attending: Family Medicine | Admitting: Nutrition

## 2019-08-11 ENCOUNTER — Encounter: Payer: Self-pay | Admitting: Nutrition

## 2019-08-11 DIAGNOSIS — I10 Essential (primary) hypertension: Secondary | ICD-10-CM | POA: Insufficient documentation

## 2019-08-11 DIAGNOSIS — E782 Mixed hyperlipidemia: Secondary | ICD-10-CM | POA: Insufficient documentation

## 2019-08-11 DIAGNOSIS — IMO0002 Reserved for concepts with insufficient information to code with codable children: Secondary | ICD-10-CM

## 2019-08-11 DIAGNOSIS — Z6841 Body Mass Index (BMI) 40.0 and over, adult: Secondary | ICD-10-CM | POA: Insufficient documentation

## 2019-08-11 DIAGNOSIS — E1165 Type 2 diabetes mellitus with hyperglycemia: Secondary | ICD-10-CM | POA: Insufficient documentation

## 2019-08-11 DIAGNOSIS — E118 Type 2 diabetes mellitus with unspecified complications: Secondary | ICD-10-CM | POA: Insufficient documentation

## 2019-08-11 NOTE — Patient Instructions (Signed)
Goals Keep up the great job!! Eat fruit with meals instead of snacks. Continue to increase high fiber foods. Avoid salt and processed foods. Get A1C down to 7% or less. Lose 5 lbs per month. Call Dr. Liliane Channel office is BS is less than 70-3 times in a row or in a week.

## 2019-08-11 NOTE — Progress Notes (Signed)
Telephone Follow up visit.  Has breathing issues/COPD. Sees a pulmonologist. A1C was 11.8% 3/.17.21 Will touch base with Dr. Dorris Fetch via phone in the next few days. FBS 88-90's now.   Bedtime 119, 125 and 135 mg/dl. 80 units Levermir, Metformin 1000 mg BID. Glipizide 10 mg. Drinking water with lemon, eating more vegetables and fruit,  Has stopped fried foods and processed foods. Eating meals meal on time.  Her daughter is her aide now and is cooking her meals and has cleaned out her cabinets and fridge of unhealthy foods. Drinks a veggie smoothie for breakfast. Cut out processed snacks. Eating only baked and grilled foods.  She notes she is retaining fluid and her heart MD suppose to call about adjustment with her fluid pills.   Goals Keep up the great job!! Eat fruit with meals instead of snacks. Continue to increase high fiber foods. Avoid salt and processed foods. Get A1C down to 7% or less. Lose 5 lbs per month. Call Dr. Liliane Channel office is BS is less than 70-3 times in a row or in a week.  Follow up in 3 months.

## 2019-08-13 ENCOUNTER — Other Ambulatory Visit: Payer: Self-pay

## 2019-08-13 DIAGNOSIS — E1159 Type 2 diabetes mellitus with other circulatory complications: Secondary | ICD-10-CM

## 2019-08-13 MED ORDER — UNABLE TO FIND: 11 refills | Status: DC

## 2019-08-15 ENCOUNTER — Ambulatory Visit: Payer: Medicare Other | Admitting: "Endocrinology

## 2019-08-24 ENCOUNTER — Encounter: Payer: Self-pay | Admitting: Pulmonary Disease

## 2019-08-27 ENCOUNTER — Other Ambulatory Visit: Payer: Self-pay | Admitting: Family Medicine

## 2019-08-29 ENCOUNTER — Other Ambulatory Visit (HOSPITAL_COMMUNITY): Payer: Medicare Other

## 2019-09-02 ENCOUNTER — Other Ambulatory Visit: Payer: Self-pay | Admitting: Family Medicine

## 2019-09-02 DIAGNOSIS — M62830 Muscle spasm of back: Secondary | ICD-10-CM

## 2019-09-11 DIAGNOSIS — I10 Essential (primary) hypertension: Secondary | ICD-10-CM | POA: Insufficient documentation

## 2019-09-11 DIAGNOSIS — G4700 Insomnia, unspecified: Secondary | ICD-10-CM | POA: Insufficient documentation

## 2019-09-11 DIAGNOSIS — J449 Chronic obstructive pulmonary disease, unspecified: Secondary | ICD-10-CM | POA: Insufficient documentation

## 2019-09-17 ENCOUNTER — Encounter: Payer: Self-pay | Admitting: "Endocrinology

## 2019-09-17 ENCOUNTER — Other Ambulatory Visit (HOSPITAL_COMMUNITY)
Admission: RE | Admit: 2019-09-17 | Discharge: 2019-09-17 | Disposition: A | Discharge status: Home / Self Care | Payer: Medicare Other | Source: Other Acute Inpatient Hospital | Attending: Family Medicine | Admitting: Family Medicine

## 2019-09-17 ENCOUNTER — Other Ambulatory Visit: Payer: Self-pay

## 2019-09-17 ENCOUNTER — Ambulatory Visit: Payer: Medicare Other | Admitting: Family Medicine

## 2019-09-17 ENCOUNTER — Ambulatory Visit (INDEPENDENT_AMBULATORY_CARE_PROVIDER_SITE_OTHER): Payer: Medicare Other | Admitting: "Endocrinology

## 2019-09-17 ENCOUNTER — Ambulatory Visit: Payer: Medicare Other | Admitting: *Deleted

## 2019-09-17 VITALS — BP 143/86 | HR 84 | Ht 64.0 in | Wt 327.0 lb

## 2019-09-17 DIAGNOSIS — E039 Hypothyroidism, unspecified: Secondary | ICD-10-CM

## 2019-09-17 DIAGNOSIS — E1165 Type 2 diabetes mellitus with hyperglycemia: Secondary | ICD-10-CM | POA: Diagnosis not present

## 2019-09-17 DIAGNOSIS — R52 Pain, unspecified: Secondary | ICD-10-CM

## 2019-09-17 DIAGNOSIS — E782 Mixed hyperlipidemia: Secondary | ICD-10-CM

## 2019-09-17 DIAGNOSIS — I10 Essential (primary) hypertension: Secondary | ICD-10-CM | POA: Diagnosis not present

## 2019-09-17 MED ORDER — LANTUS SOLOSTAR 100 UNIT/ML ~~LOC~~ SOPN: 50.0000 [IU] | PEN_INJECTOR | Freq: Two times a day (BID) | SUBCUTANEOUS | 2 refills | Status: DC

## 2019-09-17 MED ORDER — METFORMIN HCL 500 MG PO TABS: 500.0000 mg | ORAL_TABLET | Freq: Two times a day (BID) | ORAL | 3 refills | Status: DC

## 2019-09-17 NOTE — Progress Notes (Signed)
09/17/2019, 2:37 PM                    Endocrinology follow-up note   Subjective:    [name], female    DOB: [birth year].  Irving is being seen in follow-up in the management of currently uncontrolled symptomatic type 2 diabetes.  She also has comorbid dyslipidemia, hypertension.   PMD:  Fayrene Helper, MD.   Past Medical History:  Diagnosis Date  . Anxiety   . Arthritis   . Asthma   . Coronary atherosclerosis of native coronary artery    Mild nonobstructive CAD by cardiac catheterization 2008 - Dr. Terrence Dupont  . Degenerative disc disease   . Depression   . Dysrhythmia   . Essential hypertension, benign   . Folliculitis 2/50/5397  . GERD (gastroesophageal reflux disease)   . H/O hiatal hernia   . Helicobacter pylori gastritis 2008  . Hypercholesterolemia   . Hypertension   . Hyperthyroidism    s/p radiation  . Left-sided epistaxis 05/15/2018  . Low back pain   . Migraine   . Nephrolithiasis    Recurring episodes since 2004  . Severe obesity (BMI >= 40) (HCC)   . Sleep apnea    STOP BANG SCORE 6no cpap used  . Type 2 diabetes mellitus with diabetic neuropathy [facility])     Past Surgical History:  Procedure Laterality Date  . ABDOMINAL HYSTERECTOMY    . BACK SURGERY  1993  . BACK SURGERY  1994  . BACK SURGERY  2002  . BACK SURGERY  2011   [place] Left   . CHOLECYSTECTOMY  1996  . COLONOSCOPY  2000 [facility]   INT HEMORRHOIDS/FISSURE  . COLONOSCOPY  2003 [facility], CHANGE IN BOWEL HABITS   INT HEMORRHOIDS  . COLONOSCOPY  2006 [facility]   INT HEMORRHOIDS  . COLONOSCOPY  2007 [facility]   INT HEMORRHOIDS  . CYSTOSCOPY WITH STENT PLACEMENT Left 07/25/2012   Procedure: CYSTOSCOPY, left retograde pyelogram WITH left ureteral  STENT PLACEMENT;  Surgeon: Claybon Jabs, MD;  Location: [facility];  Service: Urology;  Laterality: Left;  . CYSTOSCOPY/RETROGRADE/URETEROSCOPY  12/04/2011   Procedure:  CYSTOSCOPY/RETROGRADE/URETEROSCOPY;  Surgeon: Molli Hazard, MD;  Location: [facility];  Service: Urology;  Laterality: Right;  . CYSTOSCOPY/RETROGRADE/URETEROSCOPY/STONE EXTRACTION WITH BASKET Left 09/03/2012   Procedure: CYSTOSCOPY/RETROGRADE pyelogram/digital URETEROSCOPY/STONE EXTRACTION WITH BASKET, left stent removal;  Surgeon: Molli Hazard, MD;  Location: [facility];  Service: Urology;  Laterality: Left;  . ESOPHAGOGASTRODUODENOSCOPY  2008   [facility]:ALPFX hiatal hernia./Normal esophagus without evidence of Barrett's mass, stricture, erosion or ulceration./Normal duodenal bulb and second portion of the duodenum./Diffuse erythema in the body and the antrum with occasional erosion.  Biopsies obtained via cold forceps to evaluate for H. pylori gastritis  . FRACTURE SURGERY  1999   right clavicle  . HOLMIUM LASER APPLICATION Left 9/0/2409   Procedure: HOLMIUM LASER APPLICATION;  Surgeon: Molli Hazard, MD;  Location: [facility];  Service: Urology;  Laterality: Left;  . KNEE ARTHROSCOPY  10/04/2010,    right knee arthroscopy, dr Theda Sers  . LEFT HEART CATH AND CORONARY ANGIOGRAPHY N/A 05/08/2018   Procedure: LEFT HEART CATH AND CORONARY ANGIOGRAPHY;  Surgeon: Troy Sine, MD;  Location: [place] [facility];  Service: Cardiovascular;  Laterality: N/A;  . left knee arthroscopic surgery  1999  . Left salphingectomy secondary to ectopic pregnancy  1991  . PARTIAL HYSTERECTOMY  1991  . RIGHT HEART CATH N/A 07/07/2019   Procedure: RIGHT HEART CATH;  Surgeon: Leonie Man, MD;  Location: [place] [facility];  Service: Cardiovascular;  Laterality: N/A;  . rotary cuff  Right 05/14/2014   [facility]  . UPPER GASTROINTESTINAL ENDOSCOPY  2008 abd pain   H. pylori gastritis  . UPPER GASTROINTESTINAL ENDOSCOPY  1996 [facility] [place]   PUD    Social History   Socioeconomic History  . Marital status: Single    Spouse name: Not on file  . Number of children: 1  . Years of education: 77  . Highest  education level: 12th grade  Occupational History  . Occupation: Disable   Tobacco Use  . Smoking status: Never Smoker  . Smokeless tobacco: Never Used  Substance and Sexual Activity  . Alcohol use: No  . Drug use: No  . Sexual activity: Not Currently    Partners: Male    Birth control/protection: Surgical    Comment: hyst  Other Topics Concern  . Not on file  Social History Narrative   Lives alone    Social Determinants of Health   Financial Resource Strain:   . Difficulty of Paying Living Expenses:   Food Insecurity:   . Worried About Charity fundraiser in the Last Year:   . Arboriculturist in the Last Year:   Transportation Needs:   . Film/video editor (Medical):   Marland Kitchen Lack of Transportation (Non-Medical):   Physical Activity:   . Days of Exercise per Week:   . Minutes of Exercise per Session:   Stress:   . Feeling of Stress :   Social Connections:   . Frequency of Communication with Friends and Family:   . Frequency of Social Gatherings with Friends and Family:   . Attends Religious Services:   . Active Member of Clubs or Organizations:   . Attends Archivist Meetings:   Marland Kitchen Marital Status:     Family History  Problem Relation Age of Onset  . Heart disease Mother   . Hypertension Mother   . Cancer Mother        Cervical   . Asthma Mother   . Heart attack Father   . Cancer Father        Prostate  . Colon cancer Father        DECEASED AGE 36  . Colon polyps Neg Hx     Outpatient Encounter Medications as of 09/17/2019  Medication Sig  . albuterol (VENTOLIN HFA) 108 (90 Base) MCG/ACT inhaler Inhale 2 puffs into the lungs every 6 (six) hours as needed for wheezing or shortness of breath.  Marland Kitchen amLODipine (NORVASC) 10 MG tablet TAKE 1 TABLET BY MOUTH EVERY DAY  . B-D UF III MINI PEN NEEDLES 31G X 5 MM MISC USE ONCE DAILY AS DIRECTED  . betamethasone, augmented, (DIPROLENE) 0.05 % lotion   . blood glucose meter kit and supplies Dispense based on  patient and insurance preference. Once daily testing dx. e11.9  . calcipotriene (DOVONOX) 0.005 % ointment Apply 1 application topically daily.   . cloNIDine (CATAPRES - DOSED IN MG/24 HR) 0.3 mg/24hr patch APPLY 1 PATCH TO SKIN ONCE A WEEK  . cloNIDine (CATAPRES) 0.3 MG tablet Take 1 tablet (0.3 mg total) by mouth at bedtime.  . cyclobenzaprine (FLEXERIL) 10 MG tablet TAKE 1 TABLET BY MOUTH THREE TIMES A DAY  . diazepam (VALIUM) 5 MG tablet Take one tablet by mouth three times  daily for back spasm  . DULoxetine (CYMBALTA) 60 MG capsule TAKE 1 CAPSULE BY MOUTH TWICE A DAY (Patient taking differently: Take 60 mg by mouth 2 (two) times daily. )  . ergocalciferol (VITAMIN D2) 1.25 MG (50000 UT) capsule Take 1 capsule (50,000 Units total) by mouth once a week. One capsule once weekly (Patient taking differently: Take 50,000 Units by mouth every Monday. )  . esomeprazole (NEXIUM) 40 MG capsule TAKE 1 CAPSULE BY MOUTH EVERY DAY (Patient taking differently: Take 40 mg by mouth daily before breakfast. )  . fluticasone (FLONASE) 50 MCG/ACT nasal spray Place 2 sprays into both nostrils daily. (Patient taking differently: Place 2 sprays into both nostrils daily as needed for allergies. )  . gabapentin (NEURONTIN) 800 MG tablet TAKE 1 TABLET BY MOUTH FOUR TIMES A DAY (Patient taking differently: Take 800 mg by mouth in the morning, at noon, in the evening, and at bedtime. )  . glipiZIDE (GLUCOTROL XL) 10 MG 24 hr tablet Take 1 tablet (10 mg total) by mouth daily with breakfast.  . hydrOXYzine (ATARAX/VISTARIL) 10 MG tablet Take 10 mg by mouth in the morning, at noon, and at bedtime.   . insulin glargine (LANTUS SOLOSTAR) 100 UNIT/ML Solostar Pen Inject 50 Units into the skin 2 (two) times daily.  Marland Kitchen ipratropium-albuterol (DUONEB) 0.5-2.5 (3) MG/3ML SOLN Take 3 mLs by nebulization every 6 (six) hours as needed. (Patient taking differently: Take 3 mLs by nebulization in the morning, at noon, and at bedtime. )  .  KLOR-CON M20 20 MEQ tablet TAKE 1 TABLET BY MOUTH TWICE A DAY (Patient taking differently: Take 20 mEq by mouth 2 (two) times daily. )  . Lancets (ONETOUCH DELICA PLUS KYHCWC37S) MISC TEST ONCE DAILY  . levothyroxine (SYNTHROID) 50 MCG tablet TAKE 1 TABLET BY MOUTH DAILY BEFORE BREAKFAST (Patient taking differently: Take 50 mcg by mouth daily before breakfast. )  . loratadine (CLARITIN) 10 MG tablet Take 1 tablet (10 mg total) by mouth daily.  . Magnesium Oxide 400 MG CAPS Take one (Patient taking differently: Take 400 mg by mouth at bedtime. Take one)  . meclizine (ANTIVERT) 25 MG tablet Take 25 mg by mouth in the morning, at noon, and at bedtime.   . metFORMIN (GLUCOPHAGE) 500 MG tablet Take 1 tablet (500 mg total) by mouth 2 (two) times daily with a meal.  . metoprolol tartrate (LOPRESSOR) 25 MG tablet Take 1 tablet (25 mg total) by mouth 2 (two) times daily.  . montelukast (SINGULAIR) 10 MG tablet TAKE 1 TABLET (10 MG TOTAL) BY MOUTH AT BEDTIME.  . nitroGLYCERIN (NITROSTAT) 0.4 MG SL tablet Place 1 tablet (0.4 mg total) under the tongue every 5 (five) minutes as needed for chest pain.  Marland Kitchen ondansetron (ZOFRAN) 4 MG tablet Take 1 tablet (4 mg total) by mouth every 8 (eight) hours as needed for nausea or vomiting.  Glory Rosebush VERIO test strip USE AS INSTRUCTED FOUR TIMES DAILY TESTING DX E11.65  . oxyCODONE (OXYCONTIN) 40 mg 12 hr tablet Take 1 tablet (40 mg total) by mouth every 12 (twelve) hours.  . SYMBICORT 160-4.5 MCG/ACT inhaler TAKE 2 PUFFS BY MOUTH TWICE A DAY (Patient taking differently: Inhale 2 puffs into the lungs in the morning and at bedtime. )  . triamterene-hydrochlorothiazide (MAXZIDE) 75-50 MG tablet TAKE 1 TABLET BY MOUTH EVERY DAY  . UNABLE TO FIND Glucerna 114 U/M or PRN Chux 90/M or PRN Incontinence pads 198/M or PRN  Tub Mat x 1  Grab bars  .  UNABLE TO FIND Reacher/Grabber x 1  Lift chair x 1  Lifeline X1  . UNABLE TO FIND SHARPS CONTAINER  1/Y BED SUPPORT RAIL    1/Y   . zolpidem (AMBIEN) 10 MG tablet Take 1 tablet (10 mg total) by mouth at bedtime.  Marland Kitchen zolpidem (AMBIEN) 10 MG tablet Take 1 tablet (10 mg total) by mouth at bedtime.  . [DISCONTINUED] fluconazole (DIFLUCAN) 150 MG tablet Take one tablet by mouth once daily, as needed, for vaginal itch associated with whig dose prednisone and diabetes  . [DISCONTINUED] Insulin Glargine (LANTUS SOLOSTAR) 100 UNIT/ML Solostar Pen Inject 80 Units into the skin at bedtime.  . [DISCONTINUED] metFORMIN (GLUCOPHAGE) 1000 MG tablet Take 1 tablet (1,000 mg total) by mouth 2 (two) times daily.  . [DISCONTINUED] rosuvastatin (CRESTOR) 40 MG tablet Take 40 mg by mouth daily.   No facility-administered encounter medications on file as of 09/17/2019.    ALLERGIES: Allergies  Allergen Reactions  . Morphine Other (See Comments)    Reaction with esophagus, unable to swallow.   . Ace Inhibitors Cough  . Aspirin Other (See Comments)    Reaction with esophagus, unable to swallow.   . Losartan Cough  . Tapentadol Other (See Comments)    Nausea, increased sleepiness, h/a  . Tomato Other (See Comments)    Acid reflux due to acid in tomato  . Adhesive [Tape] Rash    VACCINATION STATUS: Immunization History  Administered Date(s) Administered  . Influenza Split 03/30/2011  . Influenza Whole 03/21/2005, 02/09/2006  . Influenza,inj,Quad PF,6+ Mos 02/11/2014, 07/27/2015, 03/09/2016, 01/29/2017, 02/07/2018, 01/30/2019  . Influenza-Unspecified 01/29/2017  . Pneumococcal Polysaccharide-23 04/17/2006, 09/14/2015  . Td 04/17/2006    Diabetes She presents for her follow-up diabetic visit. She has type 2 diabetes mellitus. Her disease course has been improving. There are no hypoglycemic associated symptoms. Pertinent negatives for hypoglycemia include no confusion, headaches, pallor or seizures. Associated symptoms include fatigue. Pertinent negatives for diabetes include no blurred vision, no chest pain, no polydipsia, no  polyphagia and no polyuria. There are no hypoglycemic complications. Symptoms are improving. Diabetic complications include heart disease. Risk factors for coronary artery disease include dyslipidemia, diabetes mellitus, family history, hypertension, obesity, sedentary lifestyle and tobacco exposure. Current diabetic treatment includes oral agent (dual therapy). Her weight is stable. She is following a generally unhealthy diet. When asked about meal planning, she reported none. She has had a previous visit with a dietitian. She never participates in exercise. Her home blood glucose trend is increasing steadily. Her breakfast blood glucose range is generally 180-200 mg/dl. Her bedtime blood glucose range is generally 180-200 mg/dl. Her overall blood glucose range is 180-200 mg/dl. (She presents with slight improvement in her glycemic profile averaging 191 over the last 30 days.  Her last A1c was 11.8% improving from 13%.  She did not have any hypoglycemia.   ) An ACE inhibitor/angiotensin II receptor blocker is being taken. She does not see a podiatrist.Eye exam is not current.  Hyperlipidemia This is a chronic problem. The current episode started more than 1 year ago. The problem is controlled. Exacerbating diseases include diabetes and obesity. Associated symptoms include shortness of breath. Pertinent negatives include no chest pain or myalgias. Risk factors for coronary artery disease include diabetes mellitus, dyslipidemia, hypertension, obesity and a sedentary lifestyle.  Hypertension This is a chronic problem. The current episode started more than 1 year ago. The problem is uncontrolled. Associated symptoms include shortness of breath. Pertinent negatives include no blurred vision, chest pain,  headaches or palpitations. Risk factors for coronary artery disease include family history, dyslipidemia, diabetes mellitus, obesity, sedentary lifestyle and post-menopausal state. Past treatments include  angiotensin blockers. Hypertensive end-organ damage includes CAD/MI and heart failure.    Review of systems  Constitutional: + Minimally fluctuating body weight,  current  Body mass index is 56.13 kg/m. , no fatigue, no subjective hyperthermia, no subjective hypothermia Eyes: no blurry vision, no xerophthalmia ENT: no sore throat, no nodules palpated in throat, no dysphagia/odynophagia, no hoarseness Cardiovascular: no Chest Pain, no Shortness of Breath, no palpitations, no leg swelling Respiratory: no cough, no shortness of breath Gastrointestinal: no Nausea/Vomiting/Diarhhea Musculoskeletal: no muscle/joint aches Skin: no rashes, no hyperemia Neurological: no tremors, no numbness, no tingling, no dizziness Psychiatric: no depression, no anxiety    Objective:    BP (!) 143/86   Pulse 84   Ht '5\' 4"'$  (1.626 m)   Wt (!) 327 lb (148.3 kg)   BMI 56.13 kg/m   Wt Readings from Last 3 Encounters:  09/17/19 (!) 327 lb (148.3 kg)  07/28/19 (!) 327 lb (148.3 kg)  07/16/19 (!) 327 lb 9.6 oz (148.6 kg)       Physical Exam- Limited  Constitutional:  Body mass index is 56.13 kg/m. , not in acute distress, normal state of mind Eyes:  EOMI, no exophthalmos Neck: Supple Thyroid: No gross goiter Respiratory: Adequate breathing efforts Musculoskeletal: no gross deformities, strength intact in all four extremities, no gross restriction of joint movements Skin:  no rashes, no hyperemia Neurological: no tremor with outstretched hands,    CMP ( most recent) CMP     Component Value Date/Time   NA 139 07/07/2019 1007   K 4.0 07/07/2019 1007   CL 101 07/03/2019 1519   CO2 26 07/03/2019 1519   GLUCOSE 268 (H) 07/03/2019 1519   BUN 22 (H) 07/03/2019 1519   CREATININE 1.15 (H) 07/03/2019 1519   CREATININE 1.28 (H) 06/25/2019 1225   CALCIUM 9.5 07/03/2019 1519   PROT 8.4 (H) 06/25/2019 1225   ALBUMIN 4.0 06/05/2019 1019   AST 274 (H) 06/25/2019 1225   ALT 138 (H) 06/25/2019 1225    ALKPHOS 92 06/05/2019 1019   BILITOT 0.5 06/25/2019 1225   GFRNONAA 52 (L) 07/03/2019 1519   GFRNONAA 46 (L) 06/25/2019 1225   GFRAA >60 07/03/2019 1519   GFRAA 53 (L) 06/25/2019 1225    Diabetic Labs (most recent): Lab Results  Component Value Date   HGBA1C 11.8 (A) 07/16/2019   HGBA1C 13.0 (A) 04/02/2019   HGBA1C 11.9 (H) 12/19/2018     Lipid Panel ( most recent) Lipid Panel     Component Value Date/Time   CHOL 91 06/25/2019 1225   TRIG 115 06/25/2019 1225   HDL 46 (L) 06/25/2019 1225   CHOLHDL 2.0 06/25/2019 1225   VLDL 16 05/06/2018 0335   LDLCALC 25 06/25/2019 1225      Assessment & Plan:    1. Uncontrolled type 2 diabetes mellitus with hyperglycemia ([place])   - Williams has currently uncontrolled symptomatic type 2 DM since  59 years of age.  She presents with slight improvement in her glycemic profile averaging 191 over the last 30 days.  Her last A1c was 11.8% improving from 13%.  She did not have any hypoglycemia.    -  She did have A1c of greater than 14% last year.     Recent labs reviewed.  -her diabetes is complicated by coronary artery disease, obesity/sedentary life and she remains at  a high risk for more acute and chronic complications which include CAD, CVA, CKD, retinopathy, and neuropathy. These are all discussed in detail with her.  - I have counseled her on diet management and weight loss, by adopting a carbohydrate restricted/protein rich diet.  - she  admits there is a room for improvement in her diet and drink choices. -  Suggestion is made for her to avoid simple carbohydrates  from her diet including Cakes, Sweet Desserts / Pastries, Ice Cream, Soda (diet and regular), Sweet Tea, Candies, Chips, Cookies, Sweet Pastries,  Store Bought Juices, Alcohol in Excess of  1-2 drinks a day, Artificial Sweeteners, Coffee Creamer, and "Sugar-free" Products. This will help patient to have stable blood glucose profile and potentially avoid unintended  weight gain.  - I encouraged her to switch to  unprocessed or minimally processed complex starch and increased protein intake (animal or plant source), fruits, and vegetables.  - she is advised to stick to a routine mealtimes to eat 3 meals  a day and avoid unnecessary snacks ( to snack only to correct hypoglycemia).   - she has been scheduled with Jearld Fenton, RDN, CDE for individualized diabetes education.  - I have approached her with the following individualized plan to manage diabetes and patient agrees:   -Given her presentation with chronic glycemic burden, she will likely require multiple daily injections of insulin in order for her to achieve and maintain control of diabetes to target.    -For this to happen, she has to commit for proper monitoring of blood glucose for safe use of insulin. In the meantime she is advised to increase her Lantus to 50 units twice daily-with breakfast and at bedtime,  associated with monitoring of blood glucose at least 2 times a day-before breakfast and daily before bedtime.   - she is encouraged to call clinic for blood glucose levels less than 70 or above 200 mg /dl.  -She is advised to lower her Metformin to 500 mg p.o. twice daily due to CKD. -She is benefiting from low-dose glipizide.  She is advised to continue glipizide 10 mg p.o. daily at breakfast. - she is warned not to take insulin without proper monitoring per orders.   2) Blood Pressure /Hypertension: -Her blood pressure is not controlled to target.  she is advised to continue her current medications including amlodipine 10 mg p.o. daily, carvedilol 12.5 mg p.o. twice daily, clonidine 0.3 mg/24-hour patch, clonidine 0.3 mg daily at bedtime, hydralazine 25 mg p.o. twice daily.  She will need work-up to rule out primary hypertension on subsequent visits.  3) Lipids/Hyperlipidemia:   Review of her recent lipid panel showed  controlled  LDL at 33.  she  is advised to continue Crestor 40  mg p.o. daily at bedtime.     Side effects and precautions discussed with her.  4)  Weight/Diet: Her current BMI is 49  - clearly complicating her diabetes care.  She is a candidate for modest weight loss.  She has multiple comorbid situations complicating her chance of bariatric surgery.  I discussed with her the fact that loss of 5 - 10% of her  current body weight will have the most impact on her diabetes management.  CDE Consult will be initiated . Exercise, and detailed carbohydrates information provided  -  detailed on discharge instructions.  5) hypothyroidism:   -She was recently initiated on levothyroxine currently 50 mcg p.o. daily before breakfast.  - We discussed about the correct intake of  her thyroid hormone, on empty stomach at fasting, with water, separated by at least 30 minutes from breakfast and other medications,  and separated by more than 4 hours from calcium, iron, multivitamins, acid reflux medications (PPIs). -Patient is made aware of the fact that thyroid hormone replacement is needed for life, dose to be adjusted by periodic monitoring of thyroid function tests.   6) Chronic Care/Health Maintenance:  -she  is on Statin medications and  is encouraged to initiate and continue to follow up with Ophthalmology, Dentist,  Podiatrist at least yearly or according to recommendations, and advised to  stay away from smoking. I have recommended yearly flu vaccine and pneumonia vaccine at least every 5 years; moderate intensity exercise for up to 150 minutes weekly; and  sleep for at least 7 hours a day.  - she is  advised to maintain close follow up with Fayrene Helper, MD for primary care needs, as well as her other providers for optimal and coordinated care.  - Time spent on this patient care encounter:  35 min, of which > 50% was spent in  counseling and the rest reviewing her blood glucose logs , discussing her hypoglycemia and hyperglycemia episodes, reviewing her current  and  previous labs / studies  ( including abstraction from other facilities) and medications  doses and developing a  long term treatment plan and documenting her care.   Please refer to Patient Instructions for Blood Glucose Monitoring and Insulin/Medications Dosing Guide"  in media tab for additional information. Please  also refer to " Patient Self Inventory" in the Media  tab for reviewed elements of pertinent patient history.  [place] participated in the discussions, expressed understanding, and voiced agreement with the above plans.  All questions were answered to her satisfaction. she is encouraged to contact clinic should she have any questions or concerns prior to her return visit.   Follow up plan: - Return in about 9 weeks (around 11/19/2019) for Bring Meter and Logs- A1c in Office.  Glade Lloyd, MD [facility] [address] [place], [place] [address] Phone: [phone]  Fax: [phone]    09/17/2019, 2:37 PM  This note was partially dictated with voice recognition software. Similar sounding words can be transcribed inadequately or may not  be corrected upon review.

## 2019-09-17 NOTE — Patient Instructions (Signed)

## 2019-09-20 LAB — MISC LABCORP TEST (SEND OUT): Labcorp test code: 738526

## 2019-09-23 ENCOUNTER — Telehealth: Payer: Self-pay

## 2019-09-23 NOTE — Telephone Encounter (Signed)
Pt aware with verbal understanding

## 2019-09-23 NOTE — Telephone Encounter (Signed)
Pt received pain medication however she stated she received her urine results. This said it showed oxycodone. She does not take oxycodone she takes OxyContin. Explained to her that oxycontin was a form of oxycodone but pt has called back again questioning the oxycodone in her urine. Please advise

## 2019-09-23 NOTE — Telephone Encounter (Signed)
Correct, it is the metabolite that happens as the body breaks it down. Please let her know this is normal.

## 2019-09-23 NOTE — Telephone Encounter (Signed)
Looks like Dr Moshe Cipro sent this in May 12th. Please see if she picked it up.

## 2019-09-23 NOTE — Telephone Encounter (Signed)
Pt is wondering will her pain medication be called in?

## 2019-09-23 NOTE — Telephone Encounter (Signed)
Pt is calling back, she hasnt heard anything regarding her Urine test

## 2019-09-24 ENCOUNTER — Ambulatory Visit: Payer: Medicare Other | Admitting: Family Medicine

## 2019-09-29 ENCOUNTER — Other Ambulatory Visit: Payer: Self-pay | Admitting: "Endocrinology

## 2019-10-03 IMAGING — CT CT RENAL STONE PROTOCOL
2 of 4 series · 16 of 46 positions shown, 18 images · non-contrast
Comparison: CT abdomen and pelvis July 23, 2012

CLINICAL DATA: Left flank pain with dysuria

EXAM:
CT ABDOMEN AND PELVIS WITHOUT CONTRAST
TECHNIQUE: Multidetector CT imaging of the abdomen and pelvis was performed
following the standard protocol without oral or IV contrast.

[Series 2: axial st · axial · 0.87mm/px · z∈[+935,+1375]mm · 13 of 98 slices shown, 15 images]
[im 5/98  soft-tissue]
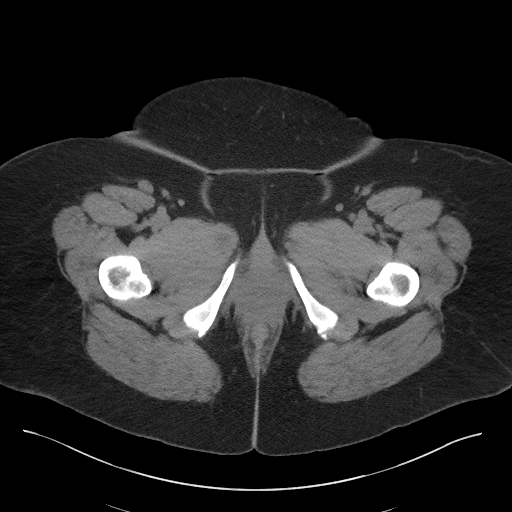
[im 5/98  bone]
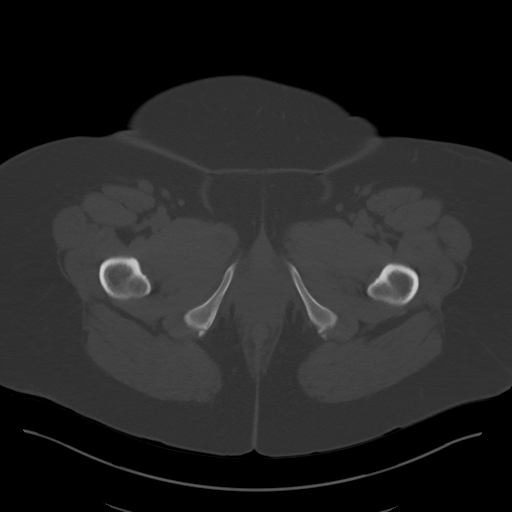
[im 13/98  soft-tissue]
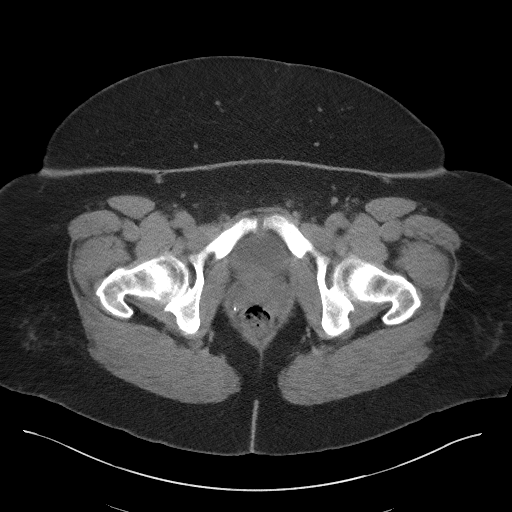
[im 21/98  soft-tissue]
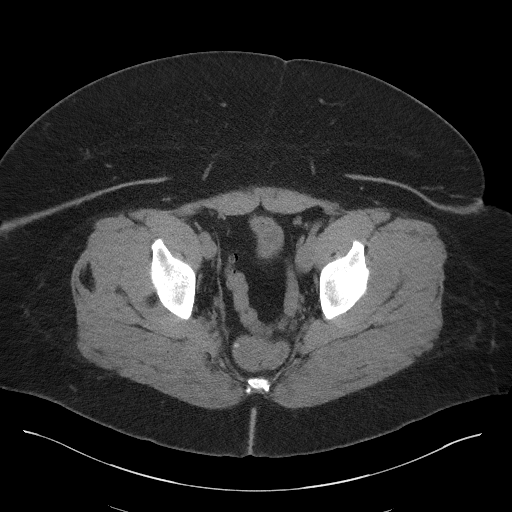
[im 29/98  soft-tissue]
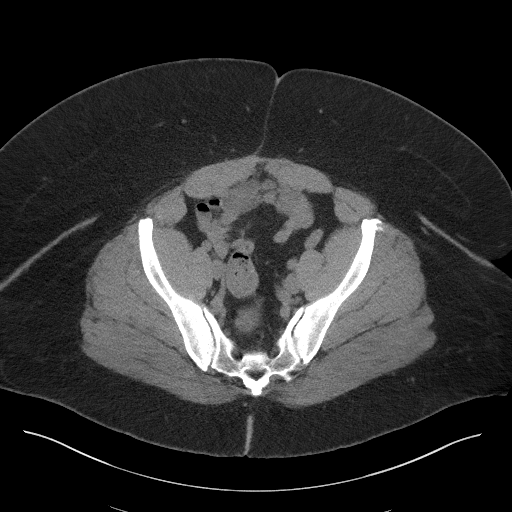
[im 33/98  soft-tissue]
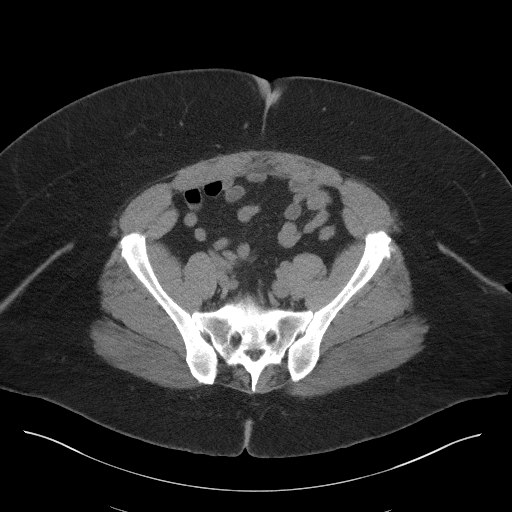
[im 41/98  soft-tissue]
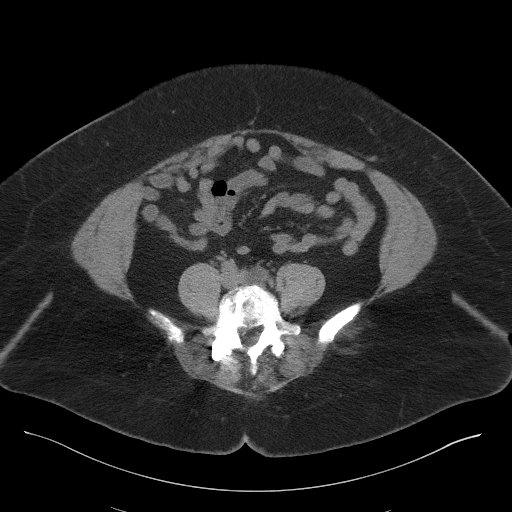
[im 49/98  soft-tissue]
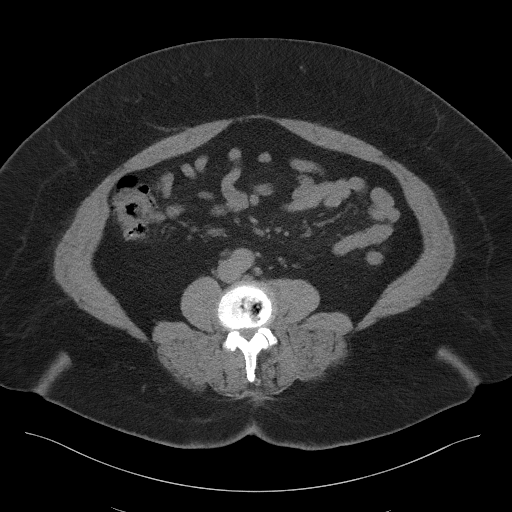
[im 57/98  soft-tissue]
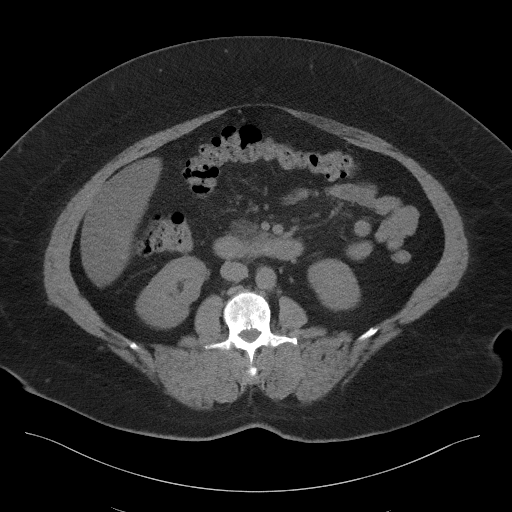
[im 65/98  soft-tissue]
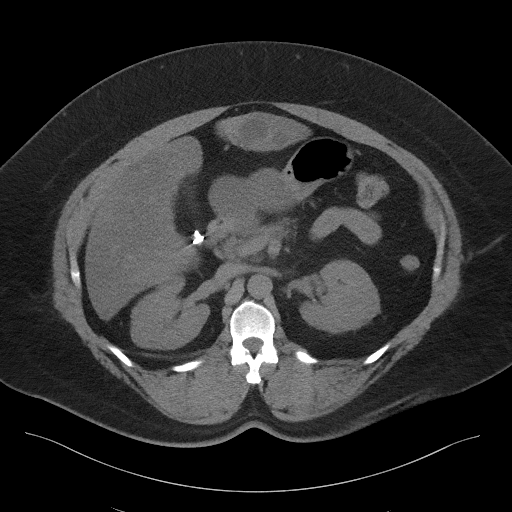
[im 65/98  bone]
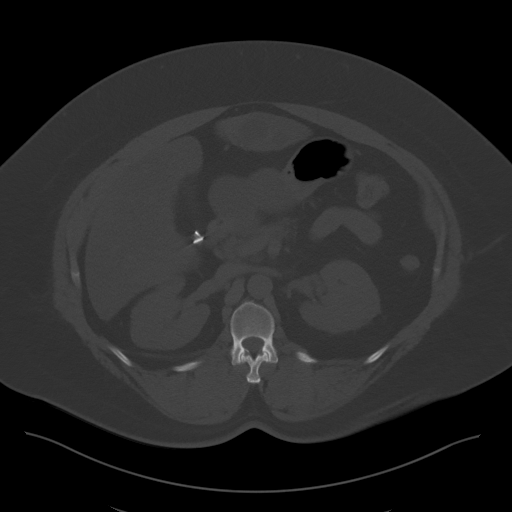
[im 69/98  soft-tissue]
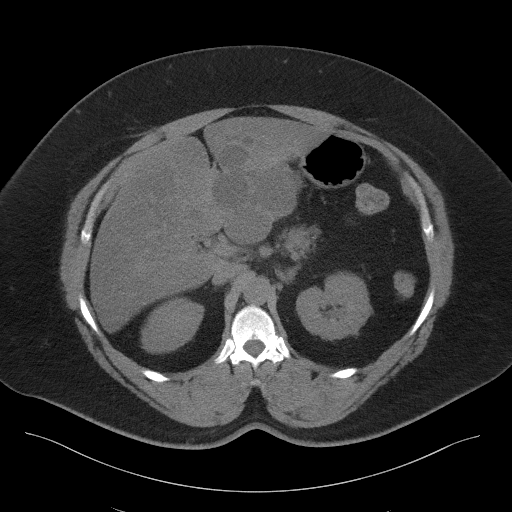
[im 77/98  soft-tissue]
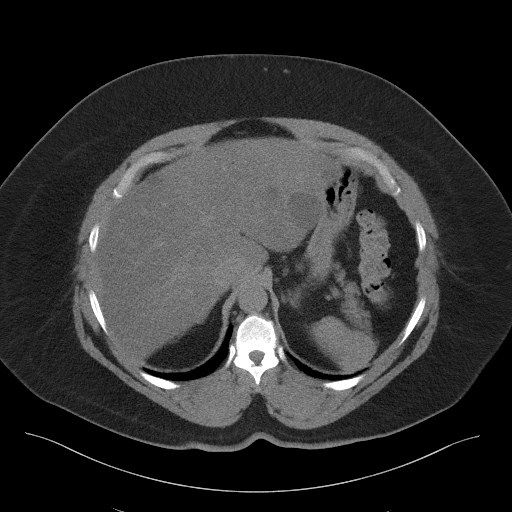
[im 85/98  soft-tissue]
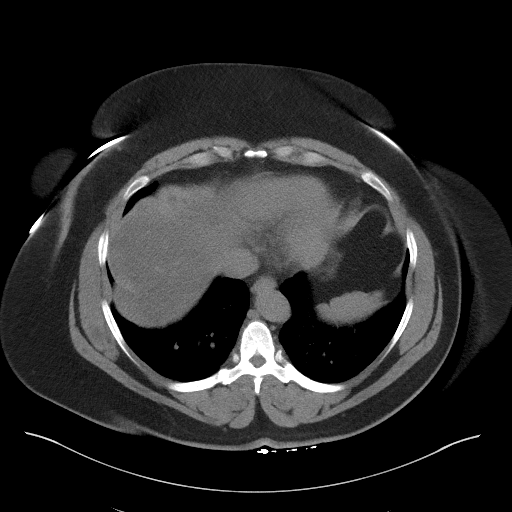
[im 93/98  soft-tissue]
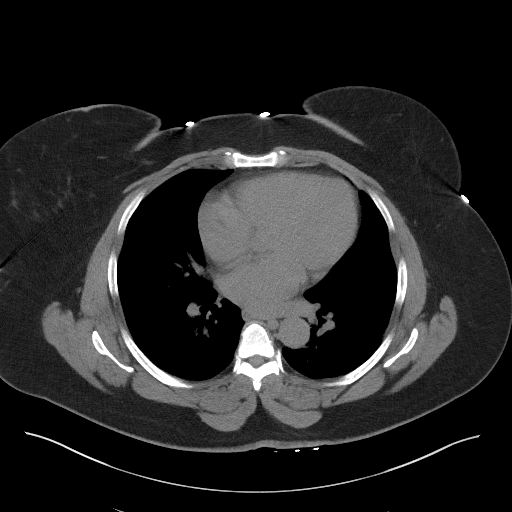

[Series 5: coronal st · coronal · 0.82mm/px · 3 of 115 slices shown]
[im 39/115  soft-tissue]
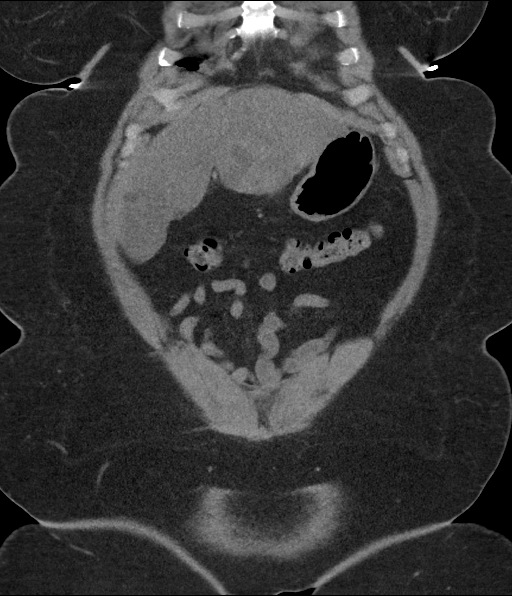
[im 51/115  soft-tissue]
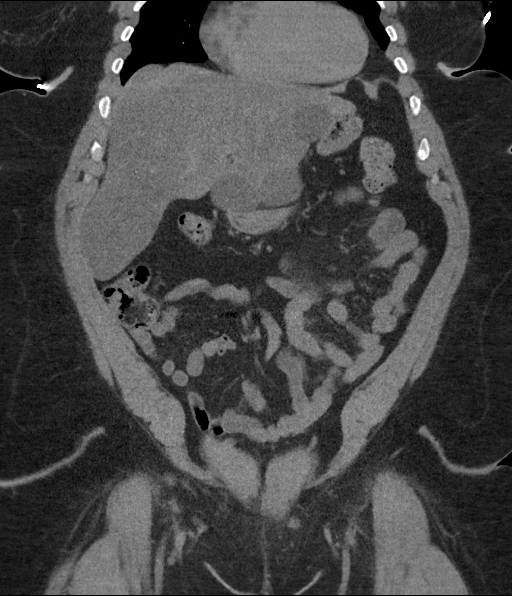
[im 64/115  soft-tissue]
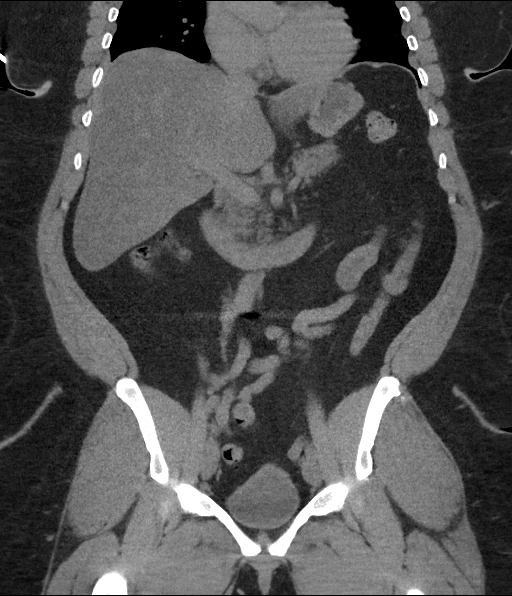

[16 of 46 positions shown; findings below may reference images not displayed]

FINDINGS: Lower chest: There is bibasilar lung atelectasis. No lung base edema
or consolidation.

Hepatobiliary: There is hepatic steatosis. There are multiple cysts
throughout the liver. The largest cysts are in the left lobe. There
is a cyst in the left lobe measuring 4.3 x 3.8 cm with an adjacent
cyst measuring 3.4 x 3.2 cm. A third nearby cyst in the left lobe of
the liver measures 3.1 x 3.2 cm. No non cystic liver lesions are
appreciable on this noncontrast enhanced study. Gallbladder is
absent. There is no biliary duct dilatation.

Pancreas: No pancreatic mass or inflammatory focus evident.

Spleen: No splenic lesions are evident.

Adrenals/Urinary Tract: Adrenals bilaterally appear unremarkable.
There is no appreciable renal mass or hydronephrosis on either side.
There is no evident renal or ureteral calculus on either side.
Urinary bladder is midline with wall thickness within normal limits.

Stomach/Bowel: There is no appreciable bowel wall or mesenteric
thickening. There are scattered colonic diverticula without
diverticulitis. No bowel obstruction. No free air or portal venous
air.

Vascular/Lymphatic: No abdominal aortic aneurysm. No vascular
lesions are evident on this noncontrast enhanced study. No
adenopathy is appreciable in the abdomen or pelvis.

Reproductive: Uterus absent.  No pelvic mass evident.

Other: Appendix appears unremarkable. No abscess or ascites is
evident in the abdomen or pelvis. There is a small ventral hernia
containing only fat.

Musculoskeletal: There is postoperative change at L4 and L5. There
are no blastic or lytic bone lesions. There are benign lipomas in
the lateral right gluteus medius and right obturator internus
muscles. No abdominal wall lesions evident.
IMPRESSION: 1.  No renal or ureteral calculus.  No hydronephrosis.

2. Hepatic steatosis with multiple cystic lesions throughout the
liver. Largest cysts are in the left lobe with several cysts
increased in size compared to prior studies.

3. Scattered colonic diverticula out diverticulitis. No bowel
obstruction. No abscess. Appendix region appears normal.

4.  Uterus absent.

5.  Small ventral hernia containing only fat.

## 2019-10-07 DIAGNOSIS — Z800 Family history of malignant neoplasm of digestive organs: Secondary | ICD-10-CM | POA: Diagnosis not present

## 2019-10-07 DIAGNOSIS — K648 Other hemorrhoids: Secondary | ICD-10-CM | POA: Diagnosis not present

## 2019-10-07 DIAGNOSIS — Z86010 Personal history of colonic polyps: Secondary | ICD-10-CM | POA: Diagnosis not present

## 2019-10-07 DIAGNOSIS — K5730 Diverticulosis of large intestine without perforation or abscess without bleeding: Secondary | ICD-10-CM | POA: Diagnosis not present

## 2019-10-07 DIAGNOSIS — Z1211 Encounter for screening for malignant neoplasm of colon: Secondary | ICD-10-CM | POA: Diagnosis not present

## 2019-10-07 DIAGNOSIS — Z8719 Personal history of other diseases of the digestive system: Secondary | ICD-10-CM | POA: Diagnosis not present

## 2019-10-07 DIAGNOSIS — K921 Melena: Secondary | ICD-10-CM | POA: Diagnosis not present

## 2019-10-07 DIAGNOSIS — E119 Type 2 diabetes mellitus without complications: Secondary | ICD-10-CM | POA: Diagnosis not present

## 2019-10-07 DIAGNOSIS — Z7984 Long term (current) use of oral hypoglycemic drugs: Secondary | ICD-10-CM | POA: Diagnosis not present

## 2019-10-09 ENCOUNTER — Other Ambulatory Visit: Payer: Self-pay

## 2019-10-09 MED ORDER — UNABLE TO FIND: 0 refills | Status: DC

## 2019-10-10 ENCOUNTER — Telehealth (INDEPENDENT_AMBULATORY_CARE_PROVIDER_SITE_OTHER): Payer: Medicare Other | Admitting: Family Medicine

## 2019-10-10 ENCOUNTER — Encounter: Payer: Self-pay | Admitting: Family Medicine

## 2019-10-10 ENCOUNTER — Other Ambulatory Visit: Payer: Self-pay

## 2019-10-10 DIAGNOSIS — R52 Pain, unspecified: Secondary | ICD-10-CM | POA: Diagnosis not present

## 2019-10-10 MED ORDER — OXYCODONE HCL ER 40 MG PO T12A: 40.0000 mg | EXTENDED_RELEASE_TABLET | Freq: Two times a day (BID) | ORAL | 0 refills | Status: DC

## 2019-10-10 NOTE — Patient Instructions (Signed)
I appreciate the opportunity to provide you with care for your health and wellness. Today we discussed: pain management  Follow up: 1 month with Dr Moshe Cipro for pain management and follow up  No labs or referrals today  Please continue to practice social distancing to keep you, your family, and our community safe.  If you must go out, please wear a mask and practice good handwashing.  It was a pleasure to see you and I look forward to continuing to work together on your health and well-being. Please do not hesitate to call the office if you need care or have questions about your care.  Have a wonderful day and week. With Gratitude, Cherly Beach, DNP, AGNP-BC

## 2019-10-10 NOTE — Assessment & Plan Note (Signed)
The patient's controlled substance registry is.  Urine drug screen on May 19 demonstrated compliance.  Adequacy of pain control and level function is assessed.  Medication dosage is continued as is and deemed appropriate.  1 month supply of medication has been provided.  With follow-up in 4 weeks.

## 2019-10-10 NOTE — Progress Notes (Signed)
Virtual Visit via Telephone Note   This visit type was conducted due to national recommendations for restrictions regarding the COVID-19 Pandemic (e.g. social distancing) in an effort to limit this patient's exposure and mitigate transmission in our community.  Due to her co-morbid illnesses, this patient is at least at moderate risk for complications without adequate follow up.  This format is felt to be most appropriate for this patient at this time.  The patient did not have access to video technology/had technical difficulties with video requiring transitioning to audio format only (telephone).  All issues noted in this document were discussed and addressed.  No physical exam could be performed with this format.   Evaluation Performed:  Follow-up visit  Date:  10/10/2019   ID:  Angela [name], DOB Mar 30, 1961, MRN [id number]  Patient Location: Home Provider Location: Office  Location of Patient: Home Location of Provider: Telehealth Consent was obtain for visit to be over via telehealth. I verified that I am speaking with the correct person using two identifiers.  PCP:  Fayrene Helper, MD   Chief Complaint:  pain  History of Present Illness:    Angela [name] is a 59 y.o. female presents today for phone visit for pain management  Indication for chronic opioid: DDD Medication and dose: OxyContin 40 mg Twice daily # pills per month: 60  Last UDS date: 09/17/2019 Opioid Treatment Agreement signed (Y/N): yes Opioid Treatment Agreement last reviewed with patient: 08/05/2018  2:55 PM NCCSRS reviewed this encounter (include red flags): Yes  The patient doesn't have symptoms concerning for COVID-19 infection (fever, chills, cough, or new shortness of breath).   Past Medical, Surgical, Social History, Allergies, and Medications have been Reviewed.  Past Medical History:  Diagnosis Date  . Anxiety   . Arthritis   . Asthma   . Coronary atherosclerosis of native coronary artery     Mild nonobstructive CAD by cardiac catheterization 2008 - Dr. Terrence Dupont  . Degenerative disc disease   . Depression   . Dysrhythmia   . Essential hypertension, benign   . Folliculitis 8/41/6606  . GERD (gastroesophageal reflux disease)   . H/O hiatal hernia   . Helicobacter pylori gastritis 2008  . Hypercholesterolemia   . Hypertension   . Hyperthyroidism    s/p radiation  . Left-sided epistaxis 05/15/2018  . Low back pain   . Migraine   . Nephrolithiasis    Recurring episodes since 2004  . Severe obesity (BMI >= 40) (HCC)   . Sleep apnea    STOP BANG SCORE 6no cpap used  . Type 2 diabetes mellitus with diabetic neuropathy [facility])    Past Surgical History:  Procedure Laterality Date  . ABDOMINAL HYSTERECTOMY    . BACK SURGERY  1993  . BACK SURGERY  1994  . BACK SURGERY  2002  . BACK SURGERY  2011   [place] Left   . CHOLECYSTECTOMY  1996  . COLONOSCOPY  2000 [facility]   INT HEMORRHOIDS/FISSURE  . COLONOSCOPY  2003 [facility], CHANGE IN BOWEL HABITS   INT HEMORRHOIDS  . COLONOSCOPY  2006 [facility]   INT HEMORRHOIDS  . COLONOSCOPY  2007 [facility]   INT HEMORRHOIDS  . CYSTOSCOPY WITH STENT PLACEMENT Left 07/25/2012   Procedure: CYSTOSCOPY, left retograde pyelogram WITH left ureteral  STENT PLACEMENT;  Surgeon: Claybon Jabs, MD;  Location: [facility];  Service: Urology;  Laterality: Left;  . CYSTOSCOPY/RETROGRADE/URETEROSCOPY  12/04/2011   Procedure: CYSTOSCOPY/RETROGRADE/URETEROSCOPY;  Surgeon: Molli Hazard, MD;  Location: [facility];  Service: Urology;  Laterality: Right;  . CYSTOSCOPY/RETROGRADE/URETEROSCOPY/STONE EXTRACTION WITH BASKET Left 09/03/2012   Procedure: CYSTOSCOPY/RETROGRADE pyelogram/digital URETEROSCOPY/STONE EXTRACTION WITH BASKET, left stent removal;  Surgeon: Molli Hazard, MD;  Location: [facility];  Service: Urology;  Laterality: Left;  . ESOPHAGOGASTRODUODENOSCOPY  2008   GNF:AOZHY hiatal hernia./Normal esophagus without evidence of  Barrett's mass, stricture, erosion or ulceration./Normal duodenal bulb and second portion of the duodenum./Diffuse erythema in the body and the antrum with occasional erosion.  Biopsies obtained via cold forceps to evaluate for H. pylori gastritis  . FRACTURE SURGERY  1999   right clavicle  . HOLMIUM LASER APPLICATION Left 12/05/5782   Procedure: HOLMIUM LASER APPLICATION;  Surgeon: Molli Hazard, MD;  Location: [facility];  Service: Urology;  Laterality: Left;  . KNEE ARTHROSCOPY  10/04/2010,    right knee arthroscopy, dr Theda Sers  . LEFT HEART CATH AND CORONARY ANGIOGRAPHY N/A 05/08/2018   Procedure: LEFT HEART CATH AND CORONARY ANGIOGRAPHY;  Surgeon: Troy Sine, MD;  Location: [place] [facility];  Service: Cardiovascular;  Laterality: N/A;  . left knee arthroscopic surgery  1999  . Left salphingectomy secondary to ectopic pregnancy  1991  . PARTIAL HYSTERECTOMY  1991  . RIGHT HEART CATH N/A 07/07/2019   Procedure: RIGHT HEART CATH;  Surgeon: Leonie Man, MD;  Location: [place] [facility];  Service: Cardiovascular;  Laterality: N/A;  . rotary cuff  Right 05/14/2014   [facility]  . UPPER GASTROINTESTINAL ENDOSCOPY  2008 abd pain   H. pylori gastritis  . UPPER GASTROINTESTINAL ENDOSCOPY  1996 [place]   PUD     Current Meds  Medication Sig  . albuterol (VENTOLIN HFA) 108 (90 Base) MCG/ACT inhaler Inhale 2 puffs into the lungs every 6 (six) hours as needed for wheezing or shortness of breath.  Marland Kitchen amLODipine (NORVASC) 10 MG tablet TAKE 1 TABLET BY MOUTH EVERY DAY  . B-D UF III MINI PEN NEEDLES 31G X 5 MM MISC USE ONCE DAILY AS DIRECTED  . betamethasone, augmented, (DIPROLENE) 0.05 % lotion   . blood glucose meter kit and supplies Dispense based on patient and insurance preference. Once daily testing dx. e11.9  . calcipotriene (DOVONOX) 0.005 % ointment Apply 1 application topically daily.   . cloNIDine (CATAPRES - DOSED IN MG/24 HR) 0.3 mg/24hr patch APPLY 1 PATCH TO SKIN ONCE A WEEK   . cloNIDine (CATAPRES) 0.3 MG tablet Take 1 tablet (0.3 mg total) by mouth at bedtime.  . cyclobenzaprine (FLEXERIL) 10 MG tablet TAKE 1 TABLET BY MOUTH THREE TIMES A DAY  . diazepam (VALIUM) 5 MG tablet Take one tablet by mouth three times daily for back spasm  . DULoxetine (CYMBALTA) 60 MG capsule TAKE 1 CAPSULE BY MOUTH TWICE A DAY (Patient taking differently: Take 60 mg by mouth 2 (two) times daily. )  . ergocalciferol (VITAMIN D2) 1.25 MG (50000 UT) capsule Take 1 capsule (50,000 Units total) by mouth once a week. One capsule once weekly (Patient taking differently: Take 50,000 Units by mouth every Monday. )  . esomeprazole (NEXIUM) 40 MG capsule TAKE 1 CAPSULE BY MOUTH EVERY DAY (Patient taking differently: Take 40 mg by mouth daily before breakfast. )  . fluticasone (FLONASE) 50 MCG/ACT nasal spray Place 2 sprays into both nostrils daily. (Patient taking differently: Place 2 sprays into both nostrils daily as needed for allergies. )  . gabapentin (NEURONTIN) 800 MG tablet TAKE 1 TABLET BY MOUTH FOUR  TIMES A DAY (Patient taking differently: Take 800 mg by mouth in the morning, at noon, in the evening, and at bedtime. )  . glipiZIDE (GLUCOTROL XL) 10 MG 24 hr tablet TAKE 1 TABLET BY MOUTH EVERY DAY WITH BREAKFAST  . hydrOXYzine (ATARAX/VISTARIL) 10 MG tablet Take 10 mg by mouth in the morning, at noon, and at bedtime.   . insulin glargine (LANTUS SOLOSTAR) 100 UNIT/ML Solostar Pen Inject 50 Units into the skin 2 (two) times daily.  Marland Kitchen ipratropium-albuterol (DUONEB) 0.5-2.5 (3) MG/3ML SOLN Take 3 mLs by nebulization every 6 (six) hours as needed. (Patient taking differently: Take 3 mLs by nebulization in the morning, at noon, and at bedtime. )  . KLOR-CON M20 20 MEQ tablet TAKE 1 TABLET BY MOUTH TWICE A DAY (Patient taking differently: Take 20 mEq by mouth 2 (two) times daily. )  . Lancets (ONETOUCH DELICA PLUS SAYTKZ60F) MISC TEST ONCE DAILY  . levothyroxine (SYNTHROID) 50 MCG tablet TAKE 1  TABLET BY MOUTH DAILY BEFORE BREAKFAST (Patient taking differently: Take 50 mcg by mouth daily before breakfast. )  . loratadine (CLARITIN) 10 MG tablet Take 1 tablet (10 mg total) by mouth daily.  . Magnesium Oxide 400 MG CAPS Take one (Patient taking differently: Take 400 mg by mouth at bedtime. Take one)  . meclizine (ANTIVERT) 25 MG tablet Take 25 mg by mouth in the morning, at noon, and at bedtime.   . metFORMIN (GLUCOPHAGE) 500 MG tablet Take 1 tablet (500 mg total) by mouth 2 (two) times daily with a meal.  . metoprolol tartrate (LOPRESSOR) 25 MG tablet Take 1 tablet (25 mg total) by mouth 2 (two) times daily.  . montelukast (SINGULAIR) 10 MG tablet TAKE 1 TABLET (10 MG TOTAL) BY MOUTH AT BEDTIME.  . nitroGLYCERIN (NITROSTAT) 0.4 MG SL tablet Place 1 tablet (0.4 mg total) under the tongue every 5 (five) minutes as needed for chest pain.  Marland Kitchen ondansetron (ZOFRAN) 4 MG tablet Take 1 tablet (4 mg total) by mouth every 8 (eight) hours as needed for nausea or vomiting.  Glory Rosebush VERIO test strip USE AS INSTRUCTED FOUR TIMES DAILY TESTING DX E11.65  . oxyCODONE (OXYCONTIN) 40 mg 12 hr tablet Take 1 tablet (40 mg total) by mouth every 12 (twelve) hours.  . SYMBICORT 160-4.5 MCG/ACT inhaler TAKE 2 PUFFS BY MOUTH TWICE A DAY (Patient taking differently: Inhale 2 puffs into the lungs in the morning and at bedtime. )  . triamterene-hydrochlorothiazide (MAXZIDE) 75-50 MG tablet TAKE 1 TABLET BY MOUTH EVERY DAY  . UNABLE TO FIND Glucerna 114 U/M or PRN Chux 90/M or PRN Incontinence pads 198/M or PRN  Tub Mat x 1  Grab bars  . UNABLE TO FIND Reacher/Grabber x 1  Lift chair x 1  Lifeline X1  . UNABLE TO FIND SHARPS CONTAINER  1/Y BED SUPPORT RAIL    1/Y  . UNABLE TO FIND Sharps container 1/year Bed support rail x 1 DX M51.16, E11.59  . zolpidem (AMBIEN) 10 MG tablet Take 1 tablet (10 mg total) by mouth at bedtime.     Allergies:   Morphine, Ace inhibitors, Aspirin, Losartan, Tapentadol, Tomato,  and Adhesive [tape]   ROS:   Please see the history of present illness.    All other systems reviewed and are negative.   Labs/Other Tests and Data Reviewed:    Recent Labs: 06/05/2019: NT-Pro BNP 194 06/25/2019: ALT 138; TSH 1.75 07/03/2019: BUN 22; Creatinine, Ser 1.15; Platelets 389 07/07/2019: Hemoglobin 12.6; Potassium 4.0;  Sodium 139   Recent Lipid Panel Lab Results  Component Value Date/Time   CHOL 91 06/25/2019 12:25 PM   TRIG 115 06/25/2019 12:25 PM   HDL 46 (L) 06/25/2019 12:25 PM   CHOLHDL 2.0 06/25/2019 12:25 PM   LDLCALC 25 06/25/2019 12:25 PM    Wt Readings from Last 3 Encounters:  10/10/19 (!) 327 lb (148.3 kg)  09/17/19 (!) 327 lb (148.3 kg)  07/28/19 (!) 327 lb (148.3 kg)     Objective:    Vital Signs:  BP (!) 143/86   Ht 5' 4" (1.626 m)   Wt (!) 327 lb (148.3 kg)   BMI 56.13 kg/m    VITAL SIGNS:  reviewed GEN:  alert and oriented RESPIRATORY:  no shortness of breath in conversation  PSYCH:  norma affect and mood   ASSESSMENT & PLAN:    1. Encounter for pain management  - oxyCODONE (OXYCONTIN) 40 mg 12 hr tablet; Take 1 tablet (40 mg total) by mouth every 12 (twelve) hours.  Dispense: 60 tablet; Refill: 0   Time:   Today, I have spent 10 minutes with the patient with telehealth technology discussing the above problems.     Medication Adjustments/Labs and Tests Ordered: Current medicines are reviewed at length with the patient today.  Concerns regarding medicines are outlined above.   Tests Ordered: No orders of the defined types were placed in this encounter.   Medication Changes: No orders of the defined types were placed in this encounter.   Disposition:  Follow up 1 month  Signed, Perlie Mayo, NP  10/10/2019 10:48 AM     [place]

## 2019-10-13 ENCOUNTER — Encounter: Payer: Self-pay | Admitting: Pulmonary Disease

## 2019-10-13 ENCOUNTER — Other Ambulatory Visit: Payer: Self-pay

## 2019-10-13 ENCOUNTER — Ambulatory Visit (INDEPENDENT_AMBULATORY_CARE_PROVIDER_SITE_OTHER): Payer: Medicare Other | Admitting: Pulmonary Disease

## 2019-10-13 DIAGNOSIS — J4530 Mild persistent asthma, uncomplicated: Secondary | ICD-10-CM

## 2019-10-13 DIAGNOSIS — I5032 Chronic diastolic (congestive) heart failure: Secondary | ICD-10-CM

## 2019-10-13 NOTE — Assessment & Plan Note (Signed)
Take Lasix 40 mg daily instead of Maxide Okay to take small amounts of Gatorade to prevent cramps. Stay on potassium 20 mEq twice daily

## 2019-10-13 NOTE — Patient Instructions (Addendum)
  Ambulatory saturation  Take Lasix 40 mg daily instead of Maxide Okay to take small amounts of Gatorade to prevent cramps. Stay on potassium 20 mEq twice daily

## 2019-10-13 NOTE — Progress Notes (Signed)
   Subjective:    [name], female    DOB: 1960-12-14, 59 y.o.   MRN: [id number]  HPI   59 year old morbidly obese never smoker for follow-up of "asthma" and chronic diastolic heart failure  She reports onset of intermittent wheezing and a dry cough since her cardiac cath in 05/2018.   PMH -  difficult to control hypertension but this is now better controlled on carvedilol, amlodipine and clonidine.  She has chronic pain for which she takes oxycodone.  She has OSA and is compliant with CPAP 10 cm with full facemask , followed by neurology  Asthma was diagnosed in 2014 spirometry  normal in 2015.  Reports increasing dyspnea and dry cough, some desaturation on arrival to the office at 91% however recovered and on subsequent ambulation oxygen saturation stayed good She reports increased bipedal edema although her weight seems to have been stable at 325 pounds since her last visit in January I reviewed office visits with cardiology and PCP, seems like she has been maintained on Maxide-she developed severe cramps in spite of taking 20 mg of potassium twice daily and has been prohibited from taking Gatorade but seems to be the only thing that relieves her cramps   CT renal stone study 06/2017 was reviewed which shows bibasilar atelectasis, no ILD Chest x-ray 04/2019 also reviewed which does not show any evidence of ILD  Significant tests/ events reviewed  07/2019-PFTs -ratio normal, FEV1 1.94/91%, significant bronchodilator response, improved to 105%, high normal DLCO  07/07/2019-right heart cath-no evidence of pulmonary hypertension, suspect noncardiac etiology for dyspnea although diastolic dysfunction component is also possible  Cardiac cath 05/2018 no significant CAD, LVEDP 14  Echo 08/1023 grade 2 diastolic dysfunction, normal LVEF  2015 normal spirometry 01/2016 split-night study-AHI 22/hour, CPAP 10 cm   Review of Systems neg for any significant sore throat,  dysphagia, itching, sneezing, nasal congestion or excess/ purulent secretions, fever, chills, sweats, unintended wt loss, pleuritic or exertional cp, hempoptysis, orthopnea pnd or change in chronic leg swelling. Also denies presyncope, palpitations, heartburn, abdominal pain, nausea, vomiting, diarrhea or change in bowel or urinary habits, dysuria,hematuria, rash, arthralgias, visual complaints, headache, numbness weakness or ataxia.     Objective:   Physical Exam  Gen. Pleasant, obese, in no distress ENT - no lesions, no post nasal drip Neck: No JVD, no thyromegaly, no carotid bruits Lungs: no use of accessory muscles, no dullness to percussion, decreased without rales or rhonchi  Cardiovascular: Rhythm regular, heart sounds  normal, no murmurs or gallops, 1+ peripheral edema Musculoskeletal: No deformities, no cyanosis or clubbing , no tremors        Assessment & Plan:

## 2019-10-13 NOTE — Assessment & Plan Note (Signed)
She does have intermittent wheezing and bronchodilator response. We will keep her on Symbicort and duo nebs to use as needed as needed. Does not need prednisone currently  If no improvement with diuretics, then will consider high-resolution CT to rule out ILD although previous imaging does not seem to indicate this.  No crackles on exam. No pulmonary hypertension on cath

## 2019-10-15 ENCOUNTER — Telehealth: Payer: Self-pay | Admitting: Pulmonary Disease

## 2019-10-15 MED ORDER — FUROSEMIDE 20 MG PO TABS: 40.0000 mg | ORAL_TABLET | Freq: Every day | ORAL | 0 refills | Status: DC

## 2019-10-15 NOTE — Telephone Encounter (Signed)
Spoke with patient. She stated that she was supposed to have a RX sent to [facility] in [place]. It was not sent. I apologized to the patient and offered to send the medication in for her. She verbalized understanding.   Nothing further needed at time of call.

## 2019-10-22 ENCOUNTER — Telehealth: Payer: Self-pay | Admitting: *Deleted

## 2019-10-22 ENCOUNTER — Encounter: Payer: Self-pay | Admitting: Internal Medicine

## 2019-10-22 ENCOUNTER — Telehealth (INDEPENDENT_AMBULATORY_CARE_PROVIDER_SITE_OTHER): Payer: Medicare Other | Admitting: Internal Medicine

## 2019-10-22 ENCOUNTER — Other Ambulatory Visit: Payer: Self-pay | Admitting: Family Medicine

## 2019-10-22 VITALS — BP 169/91 | HR 82 | Ht 64.0 in | Wt 323.0 lb

## 2019-10-22 DIAGNOSIS — R0600 Dyspnea, unspecified: Secondary | ICD-10-CM | POA: Diagnosis not present

## 2019-10-22 DIAGNOSIS — E7800 Pure hypercholesterolemia, unspecified: Secondary | ICD-10-CM

## 2019-10-22 DIAGNOSIS — I10 Essential (primary) hypertension: Secondary | ICD-10-CM

## 2019-10-22 DIAGNOSIS — R0609 Other forms of dyspnea: Secondary | ICD-10-CM

## 2019-10-22 DIAGNOSIS — I5032 Chronic diastolic (congestive) heart failure: Secondary | ICD-10-CM

## 2019-10-22 NOTE — Patient Instructions (Addendum)
Medication Instructions:  Continue  Taking lasix  *If you need a refill on your cardiac medications before your next appointment, please call your pharmacy*   Lab Work: Not needed   Testing/Procedures: Not needed  Follow-Up: At [facility], you and your health needs are our priority.  As part of our continuing mission to provide you with exceptional heart care, we have created designated Provider Care Teams.  These Care Teams include your primary Cardiologist (physician) and Advanced Practice Providers (APPs -  Physician Assistants and Nurse Practitioners) who all work together to provide you with the care you need, when you need it.    Your next appointment:   6 month(s)  The format for your next appointment:   In Person  Provider:   Cherlynn Kaiser, MD

## 2019-10-22 NOTE — Telephone Encounter (Signed)
  Patient Consent for Virtual Visit         [facility] has provided verbal consent on 10/22/2019 for a virtual visit (video or telephone).   CONSENT FOR VIRTUAL VISIT FOR:  [facility]  By participating in this virtual visit I agree to the following:  I hereby voluntarily request, consent and authorize [facility] and its employed or contracted physicians, Engineer, materials, nurse practitioners or other licensed health care professionals (the Practitioner), to provide me with telemedicine health care services (the "Services") as deemed necessary by the treating Practitioner. I acknowledge and consent to receive the Services by the Practitioner via telemedicine. I understand that the telemedicine visit will involve communicating with the Practitioner through live audiovisual communication technology and the disclosure of certain medical information by electronic transmission. I acknowledge that I have been given the opportunity to request an in-person assessment or other available alternative prior to the telemedicine visit and am voluntarily participating in the telemedicine visit.  I understand that I have the right to withhold or withdraw my consent to the use of telemedicine in the course of my care at any time, without affecting my right to future care or treatment, and that the Practitioner or I may terminate the telemedicine visit at any time. I understand that I have the right to inspect all information obtained and/or recorded in the course of the telemedicine visit and may receive copies of available information for a reasonable fee.  I understand that some of the potential risks of receiving the Services via telemedicine include:  Marland Kitchen Delay or interruption in medical evaluation due to technological equipment failure or disruption; . Information transmitted may not be sufficient (e.g. poor resolution of images) to allow for appropriate medical decision making by the Practitioner;  and/or  . In rare instances, security protocols could fail, causing a breach of personal health information.  Furthermore, I acknowledge that it is my responsibility to provide information about my medical history, conditions and care that is complete and accurate to the best of my ability. I acknowledge that Practitioner's advice, recommendations, and/or decision may be based on factors not within their control, such as incomplete or inaccurate data provided by me or distortions of diagnostic images or specimens that may result from electronic transmissions. I understand that the practice of medicine is not an exact science and that Practitioner makes no warranties or guarantees regarding treatment outcomes. I acknowledge that a copy of this consent can be made available to me via my patient portal ([place]), or I can request a printed copy by calling the office of [place].    I understand that my insurance will be billed for this visit.   I have read or had this consent read to me. . I understand the contents of this consent, which adequately explains the benefits and risks of the Services being provided via telemedicine.  . I have been provided ample opportunity to ask questions regarding this consent and the Services and have had my questions answered to my satisfaction. . I give my informed consent for the services to be provided through the use of telemedicine in my medical care

## 2019-10-22 NOTE — Telephone Encounter (Signed)
RN spoke to patient. Instruction were given  from today's virtual visit 10/22/19 .  AVS SUMMARY has been sent by mychart . F/u appointment schedule on Apr 12, 2020 at 1:40 pm  Patient verbalized understanding

## 2019-10-22 NOTE — Progress Notes (Signed)
Virtual Visit via Telephone Note   This visit type was conducted due to national recommendations for restrictions regarding the COVID-19 Pandemic (e.g. social distancing) in an effort to limit this patient's exposure and mitigate transmission in our community.  Due to her co-morbid illnesses, this patient is at least at moderate risk for complications without adequate follow up.  This format is felt to be most appropriate for this patient at this time.  The patient did not have access to video technology/had technical difficulties with video requiring transitioning to audio format only (telephone).  All issues noted in this document were discussed and addressed.  No physical exam could be performed with this format.  Please refer to the patient's chart for her  consent to telehealth for [facility].   The patient was identified using 2 identifiers.  Date:  10/22/2019   ID:  Angela [name], DOB 12-13-1960, MRN [id number]  Patient Location: Home Provider Location: Office  PCP:  Fayrene Helper, MD  Cardiologist:  Elouise Munroe, MD  Electrophysiologist:  None   Evaluation Performed:  Follow-Up Visit  Chief Complaint:  F/u DOE  History of Present Illness:    Angela [name] is a 59 y.o. female with well known to my clinic, felt to have diastolic dysfunction with LE swelling in setting of obesity.   We have struggled greatly with maintaining lasix therapy due to severe cramping. I have encouraged her to take it as often as she can during the week. It is critical that she begin walking to try and increase her conditioning which may help with shortness of breath.   Her main complaint today is waistline pain with coughing and "sizzling" on the left side of her flank and mid axillary line. We discussed cardiovascular testing that has been performed. She has had both a right and left heart cath without significant issue that would contribute to this symptom. We discussed that further  issues of an extracardiac nature may need to be discussed with her PMD to arrange abdominal imaging. She is very frustrated and I expressed by sympathies.   The patient does not have symptoms concerning for COVID-19 infection (fever, chills, cough, or new shortness of breath).    Past Medical History:  Diagnosis Date  . Anxiety   . Arthritis   . Asthma   . Coronary atherosclerosis of native coronary artery    Mild nonobstructive CAD by cardiac catheterization 2008 - Dr. Terrence Dupont  . Degenerative disc disease   . Depression   . Dysrhythmia   . Essential hypertension, benign   . Folliculitis 08/07/1446  . GERD (gastroesophageal reflux disease)   . H/O hiatal hernia   . Helicobacter pylori gastritis 2008  . Hypercholesterolemia   . Hypertension   . Hyperthyroidism    s/p radiation  . Left-sided epistaxis 05/15/2018  . Low back pain   . Migraine   . Nephrolithiasis    Recurring episodes since 2004  . Severe obesity (BMI >= 40) (HCC)   . Sleep apnea    STOP BANG SCORE 6no cpap used  . Type 2 diabetes mellitus with diabetic neuropathy [facility])    Past Surgical History:  Procedure Laterality Date  . ABDOMINAL HYSTERECTOMY    . BACK SURGERY  1993  . BACK SURGERY  1994  . BACK SURGERY  2002  . BACK SURGERY  2011   [place] Left   . CHOLECYSTECTOMY  1996  . COLONOSCOPY  2000 [facility]  INT HEMORRHOIDS/FISSURE  . COLONOSCOPY  2003 [facility], CHANGE IN BOWEL HABITS   INT HEMORRHOIDS  . COLONOSCOPY  2006 [facility]   INT HEMORRHOIDS  . COLONOSCOPY  2007 [facility]   INT HEMORRHOIDS  . CYSTOSCOPY WITH STENT PLACEMENT Left 07/25/2012   Procedure: CYSTOSCOPY, left retograde pyelogram WITH left ureteral  STENT PLACEMENT;  Surgeon: Claybon Jabs, MD;  Location: [facility];  Service: Urology;  Laterality: Left;  . CYSTOSCOPY/RETROGRADE/URETEROSCOPY  12/04/2011   Procedure: CYSTOSCOPY/RETROGRADE/URETEROSCOPY;  Surgeon: Molli Hazard, MD;  Location: [facility];  Service:  Urology;  Laterality: Right;  . CYSTOSCOPY/RETROGRADE/URETEROSCOPY/STONE EXTRACTION WITH BASKET Left 09/03/2012   Procedure: CYSTOSCOPY/RETROGRADE pyelogram/digital URETEROSCOPY/STONE EXTRACTION WITH BASKET, left stent removal;  Surgeon: Molli Hazard, MD;  Location: [facility];  Service: Urology;  Laterality: Left;  . ESOPHAGOGASTRODUODENOSCOPY  2008   [facility] hiatal hernia./Normal esophagus without evidence of Barrett's mass, stricture, erosion or ulceration./Normal duodenal bulb and second portion of the duodenum./Diffuse erythema in the body and the antrum with occasional erosion.  Biopsies obtained via cold forceps to evaluate for H. pylori gastritis  . FRACTURE SURGERY  1999   right clavicle  . HOLMIUM LASER APPLICATION Left 09/30/8313   Procedure: HOLMIUM LASER APPLICATION;  Surgeon: Molli Hazard, MD;  Location: [facility];  Service: Urology;  Laterality: Left;  . KNEE ARTHROSCOPY  10/04/2010,    right knee arthroscopy, dr Theda Sers  . LEFT HEART CATH AND CORONARY ANGIOGRAPHY N/A 05/08/2018   Procedure: LEFT HEART CATH AND CORONARY ANGIOGRAPHY;  Surgeon: Troy Sine, MD;  Location: [place] [facility];  Service: Cardiovascular;  Laterality: N/A;  . left knee arthroscopic surgery  1999  . Left salphingectomy secondary to ectopic pregnancy  1991  . PARTIAL HYSTERECTOMY  1991  . RIGHT HEART CATH N/A 07/07/2019   Procedure: RIGHT HEART CATH;  Surgeon: Leonie Man, MD;  Location: [place] [facility];  Service: Cardiovascular;  Laterality: N/A;  . rotary cuff  Right 05/14/2014   [facility]  . UPPER GASTROINTESTINAL ENDOSCOPY  2008 abd pain   H. pylori gastritis  . UPPER GASTROINTESTINAL ENDOSCOPY  1996 [facility] [place]   PUD     Current Meds  Medication Sig  . albuterol (VENTOLIN HFA) 108 (90 Base) MCG/ACT inhaler Inhale 2 puffs into the lungs every 6 (six) hours as needed for wheezing or shortness of breath.  Marland Kitchen amLODipine (NORVASC) 10 MG tablet TAKE 1 TABLET BY MOUTH EVERY DAY  . B-D  UF III MINI PEN NEEDLES 31G X 5 MM MISC USE ONCE DAILY AS DIRECTED  . betamethasone, augmented, (DIPROLENE) 0.05 % lotion   . blood glucose meter kit and supplies Dispense based on patient and insurance preference. Once daily testing dx. e11.9  . calcipotriene (DOVONOX) 0.005 % ointment Apply 1 application topically daily.   . cloNIDine (CATAPRES - DOSED IN MG/24 HR) 0.3 mg/24hr patch APPLY 1 PATCH TO SKIN ONCE A WEEK  . cloNIDine (CATAPRES) 0.3 MG tablet Take 1 tablet (0.3 mg total) by mouth at bedtime.  . cyclobenzaprine (FLEXERIL) 10 MG tablet TAKE 1 TABLET BY MOUTH THREE TIMES A DAY  . diazepam (VALIUM) 5 MG tablet Take one tablet by mouth three times daily for back spasm  . DULoxetine (CYMBALTA) 60 MG capsule TAKE 1 CAPSULE BY MOUTH TWICE A DAY (Patient taking differently: Take 60 mg by mouth 2 (two) times daily. )  . ergocalciferol (VITAMIN D2) 1.25 MG (50000 UT) capsule Take 1 capsule (50,000 Units total) by mouth once a week.  One capsule once weekly (Patient taking differently: Take 50,000 Units by mouth every Monday. )  . esomeprazole (NEXIUM) 40 MG capsule TAKE 1 CAPSULE BY MOUTH EVERY DAY (Patient taking differently: Take 40 mg by mouth daily before breakfast. )  . fluticasone (FLONASE) 50 MCG/ACT nasal spray Place 2 sprays into both nostrils daily. (Patient taking differently: Place 2 sprays into both nostrils daily as needed for allergies. )  . furosemide (LASIX) 20 MG tablet Take 2 tablets (40 mg total) by mouth daily.  Marland Kitchen gabapentin (NEURONTIN) 800 MG tablet TAKE 1 TABLET BY MOUTH FOUR TIMES A DAY (Patient taking differently: Take 800 mg by mouth in the morning, at noon, in the evening, and at bedtime. )  . glipiZIDE (GLUCOTROL XL) 10 MG 24 hr tablet TAKE 1 TABLET BY MOUTH EVERY DAY WITH BREAKFAST  . hydrOXYzine (ATARAX/VISTARIL) 10 MG tablet Take 10 mg by mouth in the morning, at noon, and at bedtime.   . insulin glargine (LANTUS SOLOSTAR) 100 UNIT/ML Solostar Pen Inject 50 Units into  the skin 2 (two) times daily.  Marland Kitchen ipratropium-albuterol (DUONEB) 0.5-2.5 (3) MG/3ML SOLN Take 3 mLs by nebulization every 6 (six) hours as needed. (Patient taking differently: Take 3 mLs by nebulization in the morning, at noon, and at bedtime. )  . KLOR-CON M20 20 MEQ tablet TAKE 1 TABLET BY MOUTH TWICE A DAY (Patient taking differently: Take 20 mEq by mouth 2 (two) times daily. )  . Lancets (ONETOUCH DELICA PLUS UXNATF57D) MISC TEST ONCE DAILY  . levothyroxine (SYNTHROID) 50 MCG tablet TAKE 1 TABLET BY MOUTH DAILY BEFORE BREAKFAST (Patient taking differently: Take 50 mcg by mouth daily before breakfast. )  . loratadine (CLARITIN) 10 MG tablet Take 1 tablet (10 mg total) by mouth daily.  . Magnesium Oxide 400 MG CAPS Take one (Patient taking differently: Take 400 mg by mouth at bedtime. Take one)  . meclizine (ANTIVERT) 25 MG tablet Take 25 mg by mouth in the morning, at noon, and at bedtime.   . metFORMIN (GLUCOPHAGE) 500 MG tablet Take 1 tablet (500 mg total) by mouth 2 (two) times daily with a meal.  . metoprolol tartrate (LOPRESSOR) 25 MG tablet Take 1 tablet (25 mg total) by mouth 2 (two) times daily.  . montelukast (SINGULAIR) 10 MG tablet TAKE 1 TABLET (10 MG TOTAL) BY MOUTH AT BEDTIME.  . nitroGLYCERIN (NITROSTAT) 0.4 MG SL tablet Place 1 tablet (0.4 mg total) under the tongue every 5 (five) minutes as needed for chest pain.  Marland Kitchen ondansetron (ZOFRAN) 4 MG tablet Take 1 tablet (4 mg total) by mouth every 8 (eight) hours as needed for nausea or vomiting.  Glory Rosebush VERIO test strip USE AS INSTRUCTED FOUR TIMES DAILY TESTING DX E11.65  . oxyCODONE (OXYCONTIN) 40 mg 12 hr tablet Take 1 tablet (40 mg total) by mouth every 12 (twelve) hours.  Marland Kitchen UNABLE TO FIND Reacher/Grabber x 1  Lift chair x 1  Lifeline X1  . UNABLE TO FIND SHARPS CONTAINER  1/Y BED SUPPORT RAIL    1/Y  . UNABLE TO FIND Sharps container 1/year Bed support rail x 1 DX M51.16, E11.59  . zolpidem (AMBIEN) 10 MG tablet Take 1  tablet (10 mg total) by mouth at bedtime.     Allergies:   Morphine, Ace inhibitors, Aspirin, Losartan, Tapentadol, Tomato, and Adhesive [tape]   Social History   Tobacco Use  . Smoking status: Never Smoker  . Smokeless tobacco: Never Used  Vaping Use  . Vaping Use:  Never used  Substance Use Topics  . Alcohol use: No  . Drug use: No     Family Hx: The patient's family history includes Asthma in her mother; Cancer in her father and mother; Colon cancer in her father; Heart attack in her father; Heart disease in her mother; Hypertension in her mother. There is no history of Colon polyps.  ROS:   Please see the history of present illness.     All other systems reviewed and are negative.   Prior CV studies:   The following studies were reviewed today:    Labs/Other Tests and Data Reviewed:    EKG:  No ECG reviewed.  Recent Labs: 06/05/2019: NT-Pro BNP 194 06/25/2019: ALT 138; TSH 1.75 07/03/2019: BUN 22; Creatinine, Ser 1.15; Platelets 389 07/07/2019: Hemoglobin 12.6; Potassium 4.0; Sodium 139   Recent Lipid Panel Lab Results  Component Value Date/Time   CHOL 178 11/10/2019 01:03 PM   TRIG 244 (H) 11/10/2019 01:03 PM   HDL 44 (L) 11/10/2019 01:03 PM   CHOLHDL 4.0 11/10/2019 01:03 PM   LDLCALC 97 11/10/2019 01:03 PM    Wt Readings from Last 3 Encounters:  10/22/19 (!) 323 lb (146.5 kg)  10/13/19 (!) 325 lb 12.8 oz (147.8 kg)  10/10/19 (!) 327 lb (148.3 kg)     Objective:    Vital Signs:  BP (!) 169/91   Pulse 82   Ht _0  (1.626 m)   Wt (!) 323 lb (146.5 kg)   BMI 55.44 kg/m    VITAL SIGNS:  reviewed GEN:  no acute distress RESPIRATORY:  Mildly impaired respiratory effort, no increased work of breathing NEURO:  alert and oriented x 3, speech normal PSYCH:  normal affect   ASSESSMENT & PLAN:    1. DOE (dyspnea on exertion)   2. Chronic diastolic heart failure ([place])   3. Hypercholesterolemia   4. Essential hypertension    Abdominal pain -  extracardiac in origin given reassuring cardiac testing. F/u with PCP.   DOE - challenging situation. Continue lasix as tolerated. Needs to increase physical activity to determine what component is due to deconditioning vs need for lasix. Needs compression stockings.   HLD - LDL quite low, this appears to be the reason her crestor was stopped. Would resume if LDL > 70 on future evaluation.   COVID-19 Education: The signs and symptoms of COVID-19 were discussed with the patient and how to seek care for testing (follow up with PCP or arrange E-visit).  The importance of social distancing was discussed today.  Time:   Today, I have spent 15 minutes with the patient with telehealth technology discussing the above problems.     Medication Adjustments/Labs and Tests Ordered: Current medicines are reviewed at length with the patient today.  Concerns regarding medicines are outlined above.   Tests Ordered: No orders of the defined types were placed in this encounter.   Medication Changes: No orders of the defined types were placed in this encounter.   Follow Up: 6 mo  Signed, Elouise Munroe, MD  10/22/2019 8:11 AM    [place]

## 2019-10-24 ENCOUNTER — Other Ambulatory Visit: Payer: Self-pay | Admitting: Family Medicine

## 2019-10-28 ENCOUNTER — Ambulatory Visit (INDEPENDENT_AMBULATORY_CARE_PROVIDER_SITE_OTHER): Payer: Medicare Other | Admitting: Podiatry

## 2019-10-28 ENCOUNTER — Other Ambulatory Visit: Payer: Self-pay

## 2019-10-28 ENCOUNTER — Ambulatory Visit: Payer: Medicare Other | Admitting: Orthotics

## 2019-10-28 DIAGNOSIS — M79675 Pain in left toe(s): Secondary | ICD-10-CM

## 2019-10-28 DIAGNOSIS — E1142 Type 2 diabetes mellitus with diabetic polyneuropathy: Secondary | ICD-10-CM

## 2019-10-28 DIAGNOSIS — B351 Tinea unguium: Secondary | ICD-10-CM | POA: Diagnosis not present

## 2019-10-28 DIAGNOSIS — L84 Corns and callosities: Secondary | ICD-10-CM

## 2019-10-28 DIAGNOSIS — M79674 Pain in right toe(s): Secondary | ICD-10-CM | POA: Diagnosis not present

## 2019-10-28 DIAGNOSIS — Q828 Other specified congenital malformations of skin: Secondary | ICD-10-CM

## 2019-10-28 NOTE — Progress Notes (Signed)

## 2019-11-01 ENCOUNTER — Encounter: Payer: Self-pay | Admitting: Podiatry

## 2019-11-01 NOTE — Progress Notes (Signed)
Subjective:  [name], female    DOB: [birth year],  MRN: [id number]  59 y.o. female presents with at risk foot care with history of diabetic neuropathy and painful callus(es) b/l and painful thick toenails that are difficult to trim. Pain interferes with ambulation. Aggravating factors include wearing enclosed shoe gear. Pain is relieved with periodic professional debridement..    Review of Systems: Negative except as noted in the HPI. Denies N/V/F/Ch. Past Medical History:  Diagnosis Date  . Anxiety   . Arthritis   . Asthma   . Coronary atherosclerosis of native coronary artery    Mild nonobstructive CAD by cardiac catheterization 2008 - Dr. Sharyn Lull  . Degenerative disc disease   . Depression   . Dysrhythmia   . Essential hypertension, benign   . Folliculitis 01/21/2015  . GERD (gastroesophageal reflux disease)   . H/O hiatal hernia   . Helicobacter pylori gastritis 2008  . Hypercholesterolemia   . Hypertension   . Hyperthyroidism    s/p radiation  . Left-sided epistaxis 05/15/2018  . Low back pain   . Migraine   . Nephrolithiasis    Recurring episodes since 2004  . Severe obesity (BMI >= 40) (HCC)   . Sleep apnea    STOP BANG SCORE 6no cpap used  . Type 2 diabetes mellitus with diabetic neuropathy [facility])    Past Surgical History:  Procedure Laterality Date  . ABDOMINAL HYSTERECTOMY    . BACK SURGERY  1993  . BACK SURGERY  1994  . BACK SURGERY  2002  . BACK SURGERY  2011   [facility]   . CARPAL TUNNEL RELEASE Left   . CHOLECYSTECTOMY  1996  . COLONOSCOPY  2000 [facility]   INT HEMORRHOIDS/FISSURE  . COLONOSCOPY  2003 [facility], CHANGE IN BOWEL HABITS   INT HEMORRHOIDS  . COLONOSCOPY  2006 [facility]   INT HEMORRHOIDS  . COLONOSCOPY  2007 [facility]   INT HEMORRHOIDS  . CYSTOSCOPY WITH STENT PLACEMENT Left 07/25/2012   Procedure: CYSTOSCOPY, left retograde pyelogram WITH left ureteral  STENT PLACEMENT;  Surgeon: Garnett Farm, MD;  Location: [facility];  Service:  Urology;  Laterality: Left;  . CYSTOSCOPY/RETROGRADE/URETEROSCOPY  12/04/2011   Procedure: CYSTOSCOPY/RETROGRADE/URETEROSCOPY;  Surgeon: Milford Cage, MD;  Location: [facility];  Service: Urology;  Laterality: Right;  . CYSTOSCOPY/RETROGRADE/URETEROSCOPY/STONE EXTRACTION WITH BASKET Left 09/03/2012   Procedure: CYSTOSCOPY/RETROGRADE pyelogram/digital URETEROSCOPY/STONE EXTRACTION WITH BASKET, left stent removal;  Surgeon: Milford Cage, MD;  Location: [facility];  Service: Urology;  Laterality: Left;  . ESOPHAGOGASTRODUODENOSCOPY  2008   YZN:[id number] hiatal hernia./Normal esophagus without evidence of Barrett's mass, stricture, erosion or ulceration./Normal duodenal bulb and second portion of the duodenum./Diffuse erythema in the body and the antrum with occasional erosion.  Biopsies obtained via cold forceps to evaluate for H. pylori gastritis  . FRACTURE SURGERY  1999   right clavicle  . HOLMIUM LASER APPLICATION Left 09/03/2012   Procedure: HOLMIUM LASER APPLICATION;  Surgeon: Milford Cage, MD;  Location: [facility];  Service: Urology;  Laterality: Left;  . KNEE ARTHROSCOPY  10/04/2010,    right knee arthroscopy, dr Thomasena Edis  . LEFT HEART CATH AND CORONARY ANGIOGRAPHY N/A 05/08/2018   Procedure: LEFT HEART CATH AND CORONARY ANGIOGRAPHY;  Surgeon: Lennette Bihari, MD;  Location: [facility] INVASIVE CV LAB;  Service: Cardiovascular;  Laterality: N/A;  . left knee arthroscopic surgery  1999  . Left salphingectomy secondary to ectopic pregnancy  1991  . PARTIAL HYSTERECTOMY  1991  . RIGHT HEART CATH  N/A 07/07/2019   Procedure: RIGHT HEART CATH;  Surgeon: Marykay Lex, MD;  Location: [facility] INVASIVE CV LAB;  Service: Cardiovascular;  Laterality: N/A;  . rotary cuff  Right 05/14/2014   [facility]  . UPPER GASTROINTESTINAL ENDOSCOPY  2008 abd pain   H. pylori gastritis  . UPPER GASTROINTESTINAL ENDOSCOPY  1996 [facility] [place]   PUD   Patient Active Problem List   Diagnosis Date Noted  . Lumbar disc disease  with radiculopathy 06/29/2019  . Urinary incontinence 06/29/2019  . Wheezing 04/08/2019  . Chronic diastolic heart failure (HCC) 04/08/2019  . Uncontrolled type 2 diabetes mellitus with hyperglycemia (HCC) 09/05/2018  . Hypothyroidism 09/05/2018  . Type 2 diabetes mellitus with hyperglycemia (HCC) 09/04/2018  . Chronic pain 05/06/2018  . Transaminitis 03/09/2018  . Contraindication to statin medication 03/09/2018  . Elevated TSH 10/14/2017  . Insomnia due to anxiety and fear 02/11/2017  . Encounter for pain management 11/09/2016  . Chronic wrist pain, left 11/06/2016  . Chronic left hip pain 11/06/2016  . Mass of right ovary 09/04/2016  . FH: colon cancer 09/01/2016  . Numbness of left hand 08/21/2016  . Allergic rhinitis 12/05/2015  . Upper airway cough syndrome 09/19/2015  . Back muscle spasm 09/14/2015  . Psoriasis 09/14/2015  . Knee pain, right 03/29/2015  . Cervical neck pain with evidence of disc disease 04/13/2014  . Coronary atherosclerosis of native coronary artery 05/26/2013  . Hemorrhoid 04/08/2012  . Vitamin D deficiency 04/04/2012  . Nephrolithiasis 12/10/2011  . Sleep apnea syndrome 12/05/2011  . GERD (gastroesophageal reflux disease) 08/16/2011  . Back pain 11/03/2010  . DOE (dyspnea on exertion) 11/03/2009  . Mixed hyperlipidemia 08/11/2008  . Morbid obesity (HCC) 01/06/2008  . Type 2 diabetes mellitus with vascular disease (HCC) 05/05/2006  . Essential hypertension, benign 05/05/2006  . Asthma 05/05/2006  . Helicobacter pylori gastritis 05/01/2006    Current Outpatient Medications:  .  albuterol (VENTOLIN HFA) 108 (90 Base) MCG/ACT inhaler, Inhale 2 puffs into the lungs every 6 (six) hours as needed for wheezing or shortness of breath., Disp: 8 g, Rfl: 6 .  amLODipine (NORVASC) 10 MG tablet, TAKE 1 TABLET BY MOUTH EVERY DAY, Disp: 90 tablet, Rfl: 3 .  B-D UF III MINI PEN NEEDLES 31G X 5 MM MISC, USE ONCE DAILY AS DIRECTED, Disp: 100 each, Rfl: 3 .   betamethasone, augmented, (DIPROLENE) 0.05 % lotion, , Disp: , Rfl:  .  blood glucose meter kit and supplies, Dispense based on patient and insurance preference. Once daily testing dx. e11.9, Disp: 1 each, Rfl: 0 .  calcipotriene (DOVONOX) 0.005 % ointment, Apply 1 application topically daily. , Disp: , Rfl:  .  cloNIDine (CATAPRES - DOSED IN MG/24 HR) 0.3 mg/24hr patch, APPLY 1 PATCH TO SKIN ONCE A WEEK, Disp: 12 patch, Rfl: 0 .  cloNIDine (CATAPRES) 0.3 MG tablet, Take 1 tablet (0.3 mg total) by mouth at bedtime., Disp: 90 tablet, Rfl: 1 .  cyclobenzaprine (FLEXERIL) 10 MG tablet, TAKE 1 TABLET BY MOUTH THREE TIMES A DAY, Disp: 90 tablet, Rfl: 1 .  diazepam (VALIUM) 5 MG tablet, Take one tablet by mouth three times daily for back spasm, Disp: 90 tablet, Rfl: 5 .  DULoxetine (CYMBALTA) 60 MG capsule, TAKE 1 CAPSULE BY MOUTH TWICE A DAY (Patient taking differently: Take 60 mg by mouth 2 (two) times daily. ), Disp: 180 capsule, Rfl: 0 .  ergocalciferol (VITAMIN D2) 1.25 MG (50000 UT) capsule, Take 1 capsule (50,000 Units total) by  mouth once a week. One capsule once weekly (Patient taking differently: Take 50,000 Units by mouth every Monday. ), Disp: 12 capsule, Rfl: 3 .  esomeprazole (NEXIUM) 40 MG capsule, TAKE 1 CAPSULE BY MOUTH EVERY DAY, Disp: 90 capsule, Rfl: 0 .  FLUoxetine (PROZAC) 20 MG capsule, Take by mouth., Disp: , Rfl:  .  fluticasone (FLONASE) 50 MCG/ACT nasal spray, Place 2 sprays into both nostrils daily. (Patient taking differently: Place 2 sprays into both nostrils daily as needed for allergies. ), Disp: 16 g, Rfl: 6 .  furosemide (LASIX) 20 MG tablet, Take 2 tablets (40 mg total) by mouth daily., Disp: 60 tablet, Rfl: 0 .  gabapentin (NEURONTIN) 800 MG tablet, TAKE 1 TABLET BY MOUTH FOUR TIMES A DAY, Disp: 360 tablet, Rfl: 0 .  glipiZIDE (GLUCOTROL XL) 10 MG 24 hr tablet, TAKE 1 TABLET BY MOUTH EVERY DAY WITH BREAKFAST, Disp: 90 tablet, Rfl: 0 .  hydrOXYzine (ATARAX/VISTARIL) 10 MG  tablet, Take 10 mg by mouth in the morning, at noon, and at bedtime. , Disp: , Rfl: 2 .  insulin glargine (LANTUS SOLOSTAR) 100 UNIT/ML Solostar Pen, Inject 50 Units into the skin 2 (two) times daily., Disp: 10 pen, Rfl: 2 .  ipratropium-albuterol (DUONEB) 0.5-2.5 (3) MG/3ML SOLN, Take 3 mLs by nebulization every 6 (six) hours as needed. (Patient taking differently: Take 3 mLs by nebulization in the morning, at noon, and at bedtime. ), Disp: 360 mL, Rfl: 3 .  KLOR-CON M20 20 MEQ tablet, TAKE 1 TABLET BY MOUTH TWICE A DAY (Patient taking differently: Take 20 mEq by mouth 2 (two) times daily. ), Disp: 180 tablet, Rfl: 1 .  Lancets (ONETOUCH DELICA PLUS GBTDVV61Y) MISC, TEST ONCE DAILY, Disp: 100 each, Rfl: 0 .  levothyroxine (SYNTHROID) 50 MCG tablet, TAKE 1 TABLET BY MOUTH DAILY BEFORE BREAKFAST (Patient taking differently: Take 50 mcg by mouth daily before breakfast. ), Disp: 90 tablet, Rfl: 1 .  loratadine (CLARITIN) 10 MG tablet, Take 1 tablet (10 mg total) by mouth daily., Disp: 30 tablet, Rfl: 11 .  Magnesium Oxide 400 MG CAPS, Take one (Patient taking differently: Take 400 mg by mouth at bedtime. Take one), Disp: 90 capsule, Rfl: 3 .  meclizine (ANTIVERT) 25 MG tablet, Take 25 mg by mouth in the morning, at noon, and at bedtime. , Disp: , Rfl:  .  metFORMIN (GLUCOPHAGE) 500 MG tablet, Take 1 tablet (500 mg total) by mouth 2 (two) times daily with a meal., Disp: 60 tablet, Rfl: 3 .  metoprolol tartrate (LOPRESSOR) 25 MG tablet, Take 1 tablet (25 mg total) by mouth 2 (two) times daily., Disp: 180 tablet, Rfl: 3 .  montelukast (SINGULAIR) 10 MG tablet, TAKE 1 TABLET (10 MG TOTAL) BY MOUTH AT BEDTIME., Disp: 90 tablet, Rfl: 1 .  nitroGLYCERIN (NITROSTAT) 0.4 MG SL tablet, Place 1 tablet (0.4 mg total) under the tongue every 5 (five) minutes as needed for chest pain., Disp: 30 tablet, Rfl: 0 .  ondansetron (ZOFRAN) 4 MG tablet, Take 1 tablet (4 mg total) by mouth every 8 (eight) hours as needed for  nausea or vomiting., Disp: 20 tablet, Rfl: 0 .  ONETOUCH VERIO test strip, USE AS INSTRUCTED FOUR TIMES DAILY TESTING DX E11.65, Disp: 100 strip, Rfl: 8 .  oxyCODONE (OXYCONTIN) 40 mg 12 hr tablet, Take 1 tablet (40 mg total) by mouth every 12 (twelve) hours., Disp: 60 tablet, Rfl: 0 .  SYMBICORT 160-4.5 MCG/ACT inhaler, TAKE 2 PUFFS BY MOUTH TWICE A DAY (Patient  taking differently: Inhale 2 puffs into the lungs in the morning and at bedtime. ), Disp: 30.6 Inhaler, Rfl: 3 .  UNABLE TO FIND, Glucerna 114 U/M or PRN Chux 90/M or PRN Incontinence pads 198/M or PRN  Tub Mat x 1  Grab bars (Patient not taking: Reported on 10/22/2019), Disp: 1 each, Rfl: 11 .  UNABLE TO FIND, Reacher/Grabber x 1  Lift chair x 1  Lifeline X1, Disp: 1 each, Rfl: 0 .  UNABLE TO FIND, SHARPS CONTAINER  1/Y BED SUPPORT RAIL    1/Y, Disp: 1 each, Rfl: 11 .  UNABLE TO FIND, Sharps container 1/year Bed support rail x 1 DX M51.16, E11.59, Disp: 1 each, Rfl: 0 .  zolpidem (AMBIEN) 10 MG tablet, Take 1 tablet (10 mg total) by mouth at bedtime., Disp: 30 tablet, Rfl: 5 .  zolpidem (AMBIEN) 10 MG tablet, Take 1 tablet (10 mg total) by mouth at bedtime., Disp: 30 tablet, Rfl: 5 Allergies  Allergen Reactions  . Morphine Other (See Comments)    Reaction with esophagus, unable to swallow.   . Ace Inhibitors Cough  . Aspirin Other (See Comments)    Reaction with esophagus, unable to swallow.   . Losartan Cough  . Tapentadol Other (See Comments)    Nausea, increased sleepiness, h/a  . Tomato Other (See Comments)    Acid reflux due to acid in tomato  . Adhesive [Tape] Rash   Social History   Tobacco Use  Smoking Status Never Smoker  Smokeless Tobacco Never Used    Objective:   Constitutional Pt is a pleasant 59 y.o. African American female, morbidly obese in NAD.Marland Kitchen  Vascular Neurovascular status unchanged b/l lower extremities. Capillary refill time to digits immediate b/l. Palpable pedal pulses b/l LE. Pedal hair sparse.  Lower extremity skin temperature gradient within normal limits. No pain with calf compression b/l. No cyanosis or clubbing noted.  Neurologic Normal speech. Oriented to person, place, and time. Protective sensation diminished with 10g monofilament b/l.  Dermatologic Pedal skin with normal turgor, texture and tone bilaterally. No open wounds bilaterally. No interdigital macerations bilaterally. Toenails 1-5 b/l elongated, discolored, dystrophic, thickened, crumbly with subungual debris and tenderness to dorsal palpation. Hyperkeratotic lesion(s) submet head 5 right foot.  No erythema, no edema, no drainage, no flocculence. Porokeratotic lesion(s) submet head 5 left foot and plantar aspect of right heel . No erythema, no edema, no drainage, no flocculence.  Orthopedic: Normal muscle strength 5/5 to all lower extremity muscle groups bilaterally. No pain crepitus or joint limitation noted with ROM b/l. Hallux valgus with bunion deformity noted b/l lower extremities. Utilizes cane for ambulation assistance.   Radiographs: None Assessment:  No diagnosis found. Plan:  Patient was evaluated and treated and all questions answered.  Onychomycosis with pain -Nails palliatively debridement as below. -Educated on self-care  Procedure: Nail Debridement Rationale: Pain Type of Debridement: manual, sharp debridement. Instrumentation: Nail nipper, rotary burr. Number of Nails: 10  -No new findings. No new orders. -Continue diabetic foot care principles.  -Toenails 1-5 b/l were debrided in length and girth with sterile nail nippers and dremel without iatrogenic bleeding.  -Callus(es) submet head 5 right foot pared utilizing sterile scalpel blade without complication or incident. Total number debrided =1. -Painful porokeratotic lesion(s) submet head 5 left foot and plantar aspect of right heel pared and enucleated with sterile scalpel blade without incident. Total lesions=2 for a total of 3 lesions pared  today.  Return in about 3 months (around 01/28/2020) for diabetic  nail and callus trim.  Marzetta Board, DPM

## 2019-11-03 ENCOUNTER — Other Ambulatory Visit: Payer: Self-pay | Admitting: Family Medicine

## 2019-11-03 DIAGNOSIS — M62830 Muscle spasm of back: Secondary | ICD-10-CM

## 2019-11-10 ENCOUNTER — Other Ambulatory Visit (HOSPITAL_COMMUNITY)
Admission: RE | Admit: 2019-11-10 | Discharge: 2019-11-10 | Disposition: A | Discharge status: Home / Self Care | Payer: Medicare Other | Source: Ambulatory Visit | Attending: Family Medicine | Admitting: Family Medicine

## 2019-11-10 ENCOUNTER — Encounter: Payer: Self-pay | Admitting: Family Medicine

## 2019-11-10 ENCOUNTER — Ambulatory Visit (INDEPENDENT_AMBULATORY_CARE_PROVIDER_SITE_OTHER): Payer: Medicare Other | Admitting: Family Medicine

## 2019-11-10 ENCOUNTER — Other Ambulatory Visit: Payer: Self-pay | Admitting: *Deleted

## 2019-11-10 ENCOUNTER — Other Ambulatory Visit: Payer: Self-pay

## 2019-11-10 VITALS — BP 132/83 | HR 78 | Temp 97.9°F | Resp 16 | Ht 64.0 in | Wt 324.4 lb

## 2019-11-10 DIAGNOSIS — G8929 Other chronic pain: Secondary | ICD-10-CM

## 2019-11-10 DIAGNOSIS — I10 Essential (primary) hypertension: Secondary | ICD-10-CM

## 2019-11-10 DIAGNOSIS — M25561 Pain in right knee: Secondary | ICD-10-CM

## 2019-11-10 DIAGNOSIS — E559 Vitamin D deficiency, unspecified: Secondary | ICD-10-CM

## 2019-11-10 DIAGNOSIS — E1159 Type 2 diabetes mellitus with other circulatory complications: Secondary | ICD-10-CM | POA: Insufficient documentation

## 2019-11-10 DIAGNOSIS — Z1159 Encounter for screening for other viral diseases: Secondary | ICD-10-CM

## 2019-11-10 DIAGNOSIS — E039 Hypothyroidism, unspecified: Secondary | ICD-10-CM

## 2019-11-10 DIAGNOSIS — R52 Pain, unspecified: Secondary | ICD-10-CM

## 2019-11-10 DIAGNOSIS — E782 Mixed hyperlipidemia: Secondary | ICD-10-CM | POA: Diagnosis not present

## 2019-11-10 DIAGNOSIS — K922 Gastrointestinal hemorrhage, unspecified: Secondary | ICD-10-CM | POA: Insufficient documentation

## 2019-11-10 MED ORDER — LORATADINE 10 MG PO TABS: 10.0000 mg | ORAL_TABLET | Freq: Every day | ORAL | 11 refills | Status: DC

## 2019-11-10 MED ORDER — KETOROLAC TROMETHAMINE 60 MG/2ML IM SOLN
60.0000 mg | Freq: Once | INTRAMUSCULAR | Status: AC
  Administered 2019-11-10: 60 mg via INTRAMUSCULAR

## 2019-11-10 MED ORDER — OXYCODONE HCL ER 40 MG PO T12A: 40.0000 mg | EXTENDED_RELEASE_TABLET | Freq: Two times a day (BID) | ORAL | 0 refills | Status: AC

## 2019-11-10 MED ORDER — OXYCODONE HCL ER 40 MG PO T12A: 40.0000 mg | EXTENDED_RELEASE_TABLET | Freq: Two times a day (BID) | ORAL | 0 refills | Status: DC

## 2019-11-10 MED ORDER — FLUTICASONE PROPIONATE 50 MCG/ACT NA SUSP: 2.0000 | Freq: Every day | NASAL | 6 refills | Status: DC

## 2019-11-10 NOTE — Patient Instructions (Addendum)
You are referred to GI  [facility] ) to look for upper source of bleeding, Dr Steward Drone  Blood pressure is good and blood sugar is improving per your report, HBA1C goal for you is 7.5 or less  Microalb from office today  Toradol 60 mg IM in office for right knee pain  Fasting labs today as ordered  Think about what you will eat, plan ahead. Choose " clean, green, fresh or frozen" over canned, processed or packaged foods which are more sugary, salty and fatty. 70 to 75% of food eaten should be vegetables and fruit. Three meals at set times with snacks allowed between meals, but they must be fruit or vegetables. Aim to eat over a 12 hour period , example 7 am to 7 pm, and STOP after  your last meal of the day. Drink water,generally about 64 ounces per day, no other drink is as healthy. Fruit juice is best enjoyed in a healthy way, by EATING the fruit.  It is important that you exercise regularly at least 30 minutes 5 times a week. If you develop chest pain, have severe difficulty breathing, or feel very tired, stop exercising immediately and seek medical attention   Exercises To Do While Sitting  Exercises that you do while sitting (chair exercises) can give you many of the same benefits as full exercise. Benefits include strengthening your heart, burning calories, and keeping muscles and joints healthy. Exercise can also improve your mood and help with depression and anxiety. You may benefit from chair exercises if you are unable to do standing exercises because of:  Diabetic foot pain.  Obesity.  Illness.  Arthritis.  Recovery from surgery or injury.  Breathing problems.  Balance problems.  Another type of disability. Before starting chair exercises, check with your health care provider or a physical therapist to find out how much exercise you can tolerate and which exercises are safe for you. If your health care provider approves:  Start out slowly and build up over time.  Aim to work up to about 10-20 minutes for each exercise session.  Make exercise part of your daily routine.  Drink water when you exercise. Do not wait until you are thirsty. Drink every 10-15 minutes.  Stop exercising right away if you have pain, nausea, shortness of breath, or dizziness.  If you are exercising in a wheelchair, make sure to lock the wheels.  Ask your health care provider whether you can do tai chi or yoga. Many positions in these mind-body exercises can be modified to do while seated. Warm-up Before starting other exercises: 1. Sit up as straight as you can. Have your knees bent at 90 degrees, which is the shape of the capital letter "L." Keep your feet flat on the floor. 2. Sit at the front edge of your chair, if you can. 3. Pull in (tighten) the muscles in your abdomen and stretch your spine and neck as straight as you can. Hold this position for a few minutes. 4. Breathe in and out evenly. Try to concentrate on your breathing, and relax your mind. Stretching Exercise A: Arm stretch 1. Hold your arms out straight in front of your body. 2. Bend your hands at the wrist with your fingers pointing up, as if signaling someone to stop. Notice the slight tension in your forearms as you hold the position. 3. Keeping your arms out and your hands bent, rotate your hands outward as far as you can and hold this stretch. Aim  to have your thumbs pointing up and your pinkie fingers pointing down. Slowly repeat arm stretches for one minute as tolerated. Exercise B: Leg stretch 1. If you can move your legs, try to "draw" letters on the floor with the toes of your foot. Write your name with one foot. 2. Write your name with the toes of your other foot. Slowly repeat the movements for one minute as tolerated. Exercise C: Reach for the sky 1. Reach your hands as far over your head as you can to stretch your spine. 2. Move your hands and arms as if you are climbing a rope. Slowly repeat  the movements for one minute as tolerated. Range of motion exercises Exercise A: Shoulder roll 1. Let your arms hang loosely at your sides. 2. Lift just your shoulders up toward your ears, then let them relax back down. 3. When your shoulders feel loose, rotate your shoulders in backward and forward circles. Do shoulder rolls slowly for one minute as tolerated. Exercise B: March in place 1. As if you are marching, pump your arms and lift your legs up and down. Lift your knees as high as you can. ? If you are unable to lift your knees, just pump your arms and move your ankles and feet up and down. March in place for one minute as tolerated. Exercise C: Seated jumping jacks 1. Let your arms hang down straight. 2. Keeping your arms straight, lift them up over your head. Aim to point your fingers to the ceiling. 3. While you lift your arms, straighten your legs and slide your heels along the floor to your sides, as wide as you can. 4. As you bring your arms back down to your sides, slide your legs back together. ? If you are unable to use your legs, just move your arms. Slowly repeat seated jumping jacks for one minute as tolerated. Strengthening exercises Exercise A: Shoulder squeeze 1. Hold your arms straight out from your body to your sides, with your elbows bent and your fists pointed at the ceiling. 2. Keeping your arms in the bent position, move them forward so your elbows and forearms meet in front of your face. 3. Open your arms back out as wide as you can with your elbows still bent, until you feel your shoulder blades squeezing together. Hold for 5 seconds. Slowly repeat the movements forward and backward for one minute as tolerated. Contact a health care provider if you:  Had to stop exercising due to any of the following: ? Pain. ? Nausea. ? Shortness of breath. ? Dizziness. ? Fatigue.  Have significant pain or soreness after exercising. Get help right away if you  have:  Chest pain.  Difficulty breathing. These symptoms may represent a serious problem that is an emergency. Do not wait to see if the symptoms will go away. Get medical help right away. Call your local emergency services (911 in the [place] Do not drive yourself to the hospital. This information is not intended to replace advice given to you by your health care provider. Make sure you discuss any questions you have with your health care provider. Document Revised: 08/08/2018 Document Reviewed: 02/28/2017 Elsevier Patient Education  2020 [employer].

## 2019-11-11 LAB — COMPLETE METABOLIC PANEL WITH GFR
AG Ratio: 1.2 (calc) (ref 1.0–2.5)
ALT: 82 U/L — ABNORMAL HIGH (ref 6–29)
AST: 138 U/L — ABNORMAL HIGH (ref 10–35)
Albumin: 4.3 g/dL (ref 3.6–5.1)
Alkaline phosphatase (APISO): 109 U/L (ref 37–153)
BUN: 14 mg/dL (ref 7–25)
CO2: 29 mmol/L (ref 20–32)
Calcium: 9.3 mg/dL (ref 8.6–10.4)
Chloride: 99 mmol/L (ref 98–110)
Creat: 0.9 mg/dL (ref 0.50–1.05)
GFR, Est African American: 81 mL/min/{1.73_m2} (ref >=60)
GFR, Est Non African American: 70 mL/min/{1.73_m2} (ref >=60)
Globulin: 3.5 g/dL (calc) (ref 1.9–3.7)
Glucose, Bld: 195 mg/dL — ABNORMAL HIGH (ref 65–99)
Potassium: 4.3 mmol/L (ref 3.5–5.3)
Sodium: 138 mmol/L (ref 135–146)
Total Bilirubin: 0.5 mg/dL (ref 0.2–1.2)
Total Protein: 7.8 g/dL (ref 6.1–8.1)

## 2019-11-11 LAB — LIPID PANEL
Cholesterol: 178 mg/dL (ref <200)
HDL: 44 mg/dL — ABNORMAL LOW (ref >=50)
LDL Cholesterol (Calc): 97 mg/dL (calc)
Non-HDL Cholesterol (Calc): 134 mg/dL (calc) — ABNORMAL HIGH (ref <130)
Total CHOL/HDL Ratio: 4 (calc) (ref <5.0)
Triglycerides: 244 mg/dL — ABNORMAL HIGH (ref <150)

## 2019-11-11 LAB — VITAMIN D 25 HYDROXY (VIT D DEFICIENCY, FRACTURES): Vit D, 25-Hydroxy: 34 ng/mL (ref 30–100)

## 2019-11-11 LAB — MICROALBUMIN, URINE: Microalb, Ur: 101.9 ug/mL — ABNORMAL HIGH

## 2019-11-15 ENCOUNTER — Other Ambulatory Visit: Payer: Self-pay | Admitting: Pulmonary Disease

## 2019-11-17 ENCOUNTER — Encounter: Payer: Self-pay | Admitting: Family Medicine

## 2019-11-17 MED ORDER — OXYCODONE HCL ER 40 MG PO T12A: 40.0000 mg | EXTENDED_RELEASE_TABLET | Freq: Two times a day (BID) | ORAL | 0 refills | Status: DC

## 2019-11-17 MED ORDER — OXYCODONE HCL ER 40 MG PO T12A: EXTENDED_RELEASE_TABLET | ORAL | 0 refills | Status: DC

## 2019-11-17 NOTE — Assessment & Plan Note (Signed)
  Patient re-educated about  the importance of commitment to a  minimum of 150 minutes of exercise per week as able.  The importance of healthy food choices with portion control discussed, as well as eating regularly and within a 12 hour window most days. The need to choose "clean , green" food 50 to 75% of the time is discussed, as well as to make water the primary drink and set a goal of 64 ounces water daily.    Weight /BMI 11/10/2019 10/22/2019 10/13/2019  WEIGHT 324 lb 6.4 oz 323 lb 325 lb 12.8 oz  HEIGHT 5\' 4"  5\' 4"  5\' 4"   BMI 55.68 kg/m2 55.44 kg/m2 55.92 kg/m2

## 2019-11-17 NOTE — Assessment & Plan Note (Signed)
chronic GI blood loss, , needs continued ebvaluation by GI at [facility] as source not yet identified , refer for same

## 2019-11-17 NOTE — Assessment & Plan Note (Signed)
Controlled, no change in medication DASH diet and commitment to daily physical activity for a minimum of 30 minutes discussed and encouraged, as a part of hypertension management. The importance of attaining a healthy weight is also discussed.  BP/Weight 11/10/2019 10/22/2019 10/13/2019 10/10/2019 09/17/2019 07/28/2019 08/12/2438  Systolic BP 102 725 366 440 347 425 956  Diastolic BP 83 91 70 86 86 74 82  Wt. (Lbs) 324.4 323 325.8 327 327 327 327.6  BMI 55.68 55.44 55.92 56.13 56.13 56.13 56.23

## 2019-11-17 NOTE — Assessment & Plan Note (Signed)
Increased and uncontrolled pain, toradol 60 mg IM in office , weight loss encouraged

## 2019-11-17 NOTE — Assessment & Plan Note (Signed)
Angela [name] is reminded of the importance of commitment to daily physical activity for 30 minutes or more, as able and the need to limit carbohydrate intake to 30 to 60 grams per meal to help with blood sugar control.   The need to take medication as prescribed, test blood sugar as directed, and to call between visits if there is a concern that blood sugar is uncontrolled is also discussed.   Angela [name] is reminded of the importance of daily foot exam, annual eye examination, and good blood sugar, blood pressure and cholesterol control. Uncontrolled, treated  Endo  Diabetic Labs Latest Ref Rng & Units 11/10/2019 07/16/2019 07/03/2019 06/25/2019 06/05/2019  HbA1c 4.0 - 5.6 % - 11.8(A) - - -  Microalbumin Not Estab. ug/mL 101.9(H) - - - -  Micro/Creat Ratio <30 mcg/mg creat - - - - -  Chol <200 mg/dL 178 - - 91 -  HDL > OR = 50 mg/dL 44(L) - - 46(L) -  Calc LDL mg/dL (calc) 97 - - 25 -  Triglycerides <150 mg/dL 244(H) - - 115 -  Creatinine 0.50 - 1.05 mg/dL 0.90 - 1.15(H) 1.28(H) 0.98  GFR >60.00 mL/min - - - - 70.27   BP/Weight 11/10/2019 10/22/2019 10/13/2019 10/10/2019 09/17/2019 07/28/2019 3/66/4403  Systolic BP 474 259 563 875 643 329 518  Diastolic BP 83 91 70 86 86 74 82  Wt. (Lbs) 324.4 323 325.8 327 327 327 327.6  BMI 55.68 55.44 55.92 56.13 56.13 56.13 56.23   Foot/eye exam completion dates Latest Ref Rng & Units 01/30/2019 02/07/2018  Eye Exam No Retinopathy - -  Foot Form Completion - Done Done

## 2019-11-17 NOTE — Progress Notes (Signed)
Angela [name]     MRN: [id number]      DOB: [birth year]   HPI Angela [name] is here for follow up and re-evaluation of chronic medical conditions, medication management and review of any available recent lab and radiology data.  Preventive health is updated, specifically  Cancer screening and Immunization.   Has had colonoscopy, still continues to have rectal bleeding and needs further testing. The PT denies any adverse reactions to current medications since the last visit.  Blood sugar still uncontrolled and gaining weight C/o increased right knee pain , requests shot  ROS Denies recent fever or chills. Denies sinus pressure, nasal congestion, ear pain or sore throat. Denies chest congestion, productive cough or wheezing. Denies chest pains, palpitations and leg swelling Denies abdominal pain, nausea, vomiting,diarrhea or constipation.   Denies dysuria, frequency, hesitancy or incontinence.  Denies headaches, seizures, numbness, or tingling. Denies depression, anxiety or insomnia. Denies skin break down or rash.   PE  BP 132/83 (BP Location: Left Wrist, Patient Position: Sitting, Cuff Size: Normal)   Pulse 78   Temp 97.9 F (36.6 C) (Oral)   Resp 16   Ht 5\' 4"  (1.626 m)   Wt (!) 324 lb 6.4 oz (147.1 kg)   SpO2 97%   BMI 55.68 kg/m   Patient alert and oriented and in no cardiopulmonary distress.  HEENT: No facial asymmetry, EOMI,     Neck supple .  Chest: Clear to auscultation bilaterally.  CVS: S1, S2 no murmurs, no S3.Regular rate.  ABD: Soft non tender.   Ext: No edema  MS: decreased  ROM spine, shoulders, hips and knees.  Skin: Intact, no ulcerations or rash noted.  Psych: Good eye contact, normal affect. Memory intact not anxious or depressed appearing.  CNS: CN 2-12 intact, power,  normal throughout.no focal deficits noted.   Assessment & Plan  GI bleeding chronic GI blood loss, , needs continued ebvaluation by GI at [facility] as source not yet identified  , refer for same  Essential hypertension, benign Controlled, no change in medication DASH diet and commitment to daily physical activity for a minimum of 30 minutes discussed and encouraged, as a part of hypertension management. The importance of attaining a healthy weight is also discussed.  BP/Weight 11/10/2019 10/22/2019 10/13/2019 10/10/2019 09/17/2019 07/28/2019 09/15/6158  Systolic BP 737 106 269 485 462 703 500  Diastolic BP 83 91 70 86 86 74 82  Wt. (Lbs) 324.4 323 325.8 327 327 327 327.6  BMI 55.68 55.44 55.92 56.13 56.13 56.13 56.23       Morbid obesity (HCC)  Patient re-educated about  the importance of commitment to a  minimum of 150 minutes of exercise per week as able.  The importance of healthy food choices with portion control discussed, as well as eating regularly and within a 12 hour window most days. The need to choose "clean , green" food 50 to 75% of the time is discussed, as well as to make water the primary drink and set a goal of 64 ounces water daily.    Weight /BMI 11/10/2019 10/22/2019 10/13/2019  WEIGHT 324 lb 6.4 oz 323 lb 325 lb 12.8 oz  HEIGHT 5\' 4"  5\' 4"  5\' 4"   BMI 55.68 kg/m2 55.44 kg/m2 55.92 kg/m2      Knee pain, right Increased and uncontrolled pain, toradol 60 mg IM in office , weight loss encouraged  Encounter for pain management The patient's Controlled Substance registry is reviewed and compliance confirmed. Adequacy of  Pain  control and level of function is assessed. Medication dosing is adjusted as deemed appropriate. Twelve weeks of medication is prescribed  and is provided with a follow up appointment between 11 to 12 weeks .   Mixed hyperlipidemia Hyperlipidemia:Low fat diet discussed and encouraged.   Lipid Panel  Lab Results  Component Value Date   CHOL 178 11/10/2019   HDL 44 (L) 11/10/2019   LDLCALC 97 11/10/2019   TRIG 244 (H) 11/10/2019   CHOLHDL 4.0 11/10/2019  needs to reduce fat high TG     Type 2 diabetes  mellitus with vascular disease ([place]) Angela [name] is reminded of the importance of commitment to daily physical activity for 30 minutes or more, as able and the need to limit carbohydrate intake to 30 to 60 grams per meal to help with blood sugar control.   The need to take medication as prescribed, test blood sugar as directed, and to call between visits if there is a concern that blood sugar is uncontrolled is also discussed.   Angela [name] is reminded of the importance of daily foot exam, annual eye examination, and good blood sugar, blood pressure and cholesterol control. Uncontrolled, treated  Endo  Diabetic Labs Latest Ref Rng & Units 11/10/2019 07/16/2019 07/03/2019 06/25/2019 06/05/2019  HbA1c 4.0 - 5.6 % - 11.8(A) - - -  Microalbumin Not Estab. ug/mL 101.9(H) - - - -  Micro/Creat Ratio <30 mcg/mg creat - - - - -  Chol <200 mg/dL 178 - - 91 -  HDL > OR = 50 mg/dL 44(L) - - 46(L) -  Calc LDL mg/dL (calc) 97 - - 25 -  Triglycerides <150 mg/dL 244(H) - - 115 -  Creatinine 0.50 - 1.05 mg/dL 0.90 - 1.15(H) 1.28(H) 0.98  GFR >60.00 mL/min - - - - 70.27   BP/Weight 11/10/2019 10/22/2019 10/13/2019 10/10/2019 09/17/2019 07/28/2019 7/84/6962  Systolic BP 952 841 324 401 027 253 664  Diastolic BP 83 91 70 86 86 74 82  Wt. (Lbs) 324.4 323 325.8 327 327 327 327.6  BMI 55.68 55.44 55.92 56.13 56.13 56.13 56.23   Foot/eye exam completion dates Latest Ref Rng & Units 01/30/2019 02/07/2018  Eye Exam No Retinopathy - -  Foot Form Completion - Done Done

## 2019-11-17 NOTE — Assessment & Plan Note (Signed)
The patient's Controlled Substance registry is reviewed and compliance confirmed. Adequacy of  Pain control and level of function is assessed. Medication dosing is adjusted as deemed appropriate. Twelve weeks of medication is prescribed  and is provided with a follow up appointment between 11 to 12 weeks .

## 2019-11-17 NOTE — Assessment & Plan Note (Signed)
Hyperlipidemia:Low fat diet discussed and encouraged.   Lipid Panel  Lab Results  Component Value Date   CHOL 178 11/10/2019   HDL 44 (L) 11/10/2019   LDLCALC 97 11/10/2019   TRIG 244 (H) 11/10/2019   CHOLHDL 4.0 11/10/2019  needs to reduce fat high TG

## 2019-11-18 NOTE — Telephone Encounter (Signed)
Dr. Elsworth Soho, please advise if you are okay with [place] refilling med or if it needs to be deferred to PCP.

## 2019-11-20 ENCOUNTER — Ambulatory Visit (INDEPENDENT_AMBULATORY_CARE_PROVIDER_SITE_OTHER): Payer: Medicare Other | Admitting: "Endocrinology

## 2019-11-20 ENCOUNTER — Other Ambulatory Visit: Payer: Self-pay

## 2019-11-20 ENCOUNTER — Encounter: Payer: Self-pay | Admitting: "Endocrinology

## 2019-11-20 VITALS — BP 119/80 | HR 80 | Ht 64.0 in | Wt 316.6 lb

## 2019-11-20 DIAGNOSIS — E782 Mixed hyperlipidemia: Secondary | ICD-10-CM | POA: Diagnosis not present

## 2019-11-20 DIAGNOSIS — E039 Hypothyroidism, unspecified: Secondary | ICD-10-CM

## 2019-11-20 DIAGNOSIS — E1165 Type 2 diabetes mellitus with hyperglycemia: Secondary | ICD-10-CM | POA: Diagnosis not present

## 2019-11-20 DIAGNOSIS — I10 Essential (primary) hypertension: Secondary | ICD-10-CM | POA: Diagnosis not present

## 2019-11-20 LAB — POCT GLYCOSYLATED HEMOGLOBIN (HGB A1C): Hemoglobin A1C: 11.4 % — AB (ref 4.0–5.6)

## 2019-11-20 MED ORDER — LANTUS SOLOSTAR 100 UNIT/ML ~~LOC~~ SOPN: 80.0000 [IU] | PEN_INJECTOR | Freq: Every day | SUBCUTANEOUS | 2 refills | Status: DC

## 2019-11-20 NOTE — Progress Notes (Signed)
11/20/2019, 4:51 PM                    Endocrinology follow-up note   Subjective:    [name], female    DOB: Oct 06, 1957.  [place] is being seen in follow-up in the management of currently uncontrolled symptomatic type 2 diabetes.  She also has comorbid dyslipidemia, hypertension.   PMD:  Fayrene Helper, MD.   Past Medical History:  Diagnosis Date  . Anxiety   . Arthritis   . Asthma   . Coronary atherosclerosis of native coronary artery    Mild nonobstructive CAD by cardiac catheterization 2008 - Dr. Terrence Dupont  . Degenerative disc disease   . Depression   . Dysrhythmia   . Essential hypertension, benign   . Folliculitis 6/94/5038  . GERD (gastroesophageal reflux disease)   . H/O hiatal hernia   . Helicobacter pylori gastritis 2008  . Hypercholesterolemia   . Hypertension   . Hyperthyroidism    s/p radiation  . Left-sided epistaxis 05/15/2018  . Low back pain   . Migraine   . Nephrolithiasis    Recurring episodes since 2004  . Severe obesity (BMI >= 40) (HCC)   . Sleep apnea    STOP BANG SCORE 6no cpap used  . Type 2 diabetes mellitus with diabetic neuropathy [facility])     Past Surgical History:  Procedure Laterality Date  . ABDOMINAL HYSTERECTOMY    . BACK SURGERY  1993  . BACK SURGERY  1994  . BACK SURGERY  2002  . BACK SURGERY  2011   [place] Left   . CHOLECYSTECTOMY  1996  . COLONOSCOPY  2000 [facility]   INT HEMORRHOIDS/FISSURE  . COLONOSCOPY  2003 [facility], CHANGE IN BOWEL HABITS   INT HEMORRHOIDS  . COLONOSCOPY  2006 [facility]   INT HEMORRHOIDS  . COLONOSCOPY  2007 [facility]   INT HEMORRHOIDS  . CYSTOSCOPY WITH STENT PLACEMENT Left 07/25/2012   Procedure: CYSTOSCOPY, left retograde pyelogram WITH left ureteral  STENT PLACEMENT;  Surgeon: Claybon Jabs, MD;  Location: [facility];  Service: Urology;  Laterality: Left;  . CYSTOSCOPY/RETROGRADE/URETEROSCOPY  12/04/2011   Procedure:  CYSTOSCOPY/RETROGRADE/URETEROSCOPY;  Surgeon: Molli Hazard, MD;  Location: [facility];  Service: Urology;  Laterality: Right;  . CYSTOSCOPY/RETROGRADE/URETEROSCOPY/STONE EXTRACTION WITH BASKET Left 09/03/2012   Procedure: CYSTOSCOPY/RETROGRADE pyelogram/digital URETEROSCOPY/STONE EXTRACTION WITH BASKET, left stent removal;  Surgeon: Molli Hazard, MD;  Location: [facility];  Service: Urology;  Laterality: Left;  . ESOPHAGOGASTRODUODENOSCOPY  2008   UEK:CMKLK hiatal hernia./Normal esophagus without evidence of Barrett's mass, stricture, erosion or ulceration./Normal duodenal bulb and second portion of the duodenum./Diffuse erythema in the body and the antrum with occasional erosion.  Biopsies obtained via cold forceps to evaluate for H. pylori gastritis  . FRACTURE SURGERY  1999   right clavicle  . HOLMIUM LASER APPLICATION Left 12/30/7913   Procedure: HOLMIUM LASER APPLICATION;  Surgeon: Molli Hazard, MD;  Location: [facility];  Service: Urology;  Laterality: Left;  . KNEE ARTHROSCOPY  10/04/2010,    right knee arthroscopy, dr Theda Sers  . LEFT HEART CATH AND CORONARY ANGIOGRAPHY N/A 05/08/2018   Procedure: LEFT HEART CATH AND CORONARY ANGIOGRAPHY;  Surgeon: Troy Sine, MD;  Location: [place] [facility];  Service: Cardiovascular;  Laterality: N/A;  . left knee arthroscopic surgery  1999  . Left salphingectomy secondary to ectopic pregnancy  1991  . PARTIAL HYSTERECTOMY  1991  . RIGHT HEART CATH N/A 07/07/2019   Procedure: RIGHT HEART CATH;  Surgeon: Leonie Man, MD;  Location: [place] [facility];  Service: Cardiovascular;  Laterality: N/A;  . rotary cuff  Right 05/14/2014   [facility]  . UPPER GASTROINTESTINAL ENDOSCOPY  2008 abd pain   H. pylori gastritis  . UPPER GASTROINTESTINAL ENDOSCOPY  1996 [facility]   PUD    Social History   Socioeconomic History  . Marital status: Single    Spouse name: Not on file  . Number of children: 1  . Years of education: 52  . Highest  education level: 12th grade  Occupational History  . Occupation: Disable   Tobacco Use  . Smoking status: Never Smoker  . Smokeless tobacco: Never Used  Vaping Use  . Vaping Use: Never used  Substance and Sexual Activity  . Alcohol use: No  . Drug use: No  . Sexual activity: Not Currently    Partners: Male    Birth control/protection: Surgical    Comment: hyst  Other Topics Concern  . Not on file  Social History Narrative   Lives alone    Social Determinants of Health   Financial Resource Strain:   . Difficulty of Paying Living Expenses:   Food Insecurity:   . Worried About Charity fundraiser in the Last Year:   . Arboriculturist in the Last Year:   Transportation Needs:   . Film/video editor (Medical):   Marland Kitchen Lack of Transportation (Non-Medical):   Physical Activity:   . Days of Exercise per Week:   . Minutes of Exercise per Session:   Stress:   . Feeling of Stress :   Social Connections:   . Frequency of Communication with Friends and Family:   . Frequency of Social Gatherings with Friends and Family:   . Attends Religious Services:   . Active Member of Clubs or Organizations:   . Attends Archivist Meetings:   Marland Kitchen Marital Status:     Family History  Problem Relation Age of Onset  . Heart disease Mother   . Hypertension Mother   . Cancer Mother        Cervical   . Asthma Mother   . Heart attack Father   . Cancer Father        Prostate  . Colon cancer Father        DECEASED AGE 77  . Colon polyps Neg Hx     Outpatient Encounter Medications as of 11/20/2019  Medication Sig  . albuterol (VENTOLIN HFA) 108 (90 Base) MCG/ACT inhaler Inhale 2 puffs into the lungs every 6 (six) hours as needed for wheezing or shortness of breath.  Marland Kitchen amLODipine (NORVASC) 10 MG tablet TAKE 1 TABLET BY MOUTH EVERY DAY  . B-D UF III MINI PEN NEEDLES 31G X 5 MM MISC USE ONCE DAILY AS DIRECTED  . betamethasone, augmented, (DIPROLENE) 0.05 % lotion   . blood glucose  meter kit and supplies Dispense based on patient and insurance preference. Once daily testing dx. e11.9  . calcipotriene (DOVONOX) 0.005 % ointment Apply 1 application topically daily.   . cloNIDine (CATAPRES - DOSED IN MG/24 HR) 0.3 mg/24hr patch APPLY 1 PATCH TO SKIN ONCE A WEEK  . cloNIDine (CATAPRES) 0.3 MG tablet Take 1 tablet (0.3 mg total) by mouth at bedtime.  . cyclobenzaprine (FLEXERIL) 10 MG tablet TAKE 1 TABLET BY MOUTH THREE TIMES A DAY  . diazepam (VALIUM) 5  MG tablet Take one tablet by mouth three times daily for back spasm  . DULoxetine (CYMBALTA) 60 MG capsule TAKE 1 CAPSULE BY MOUTH TWICE A DAY (Patient taking differently: Take 60 mg by mouth 2 (two) times daily. )  . ergocalciferol (VITAMIN D2) 1.25 MG (50000 UT) capsule Take 1 capsule (50,000 Units total) by mouth once a week. One capsule once weekly (Patient taking differently: Take 50,000 Units by mouth every Monday. )  . esomeprazole (NEXIUM) 40 MG capsule TAKE 1 CAPSULE BY MOUTH EVERY DAY  . FLUoxetine (PROZAC) 20 MG capsule Take by mouth.  . fluticasone (FLONASE) 50 MCG/ACT nasal spray Place 2 sprays into both nostrils daily.  . furosemide (LASIX) 20 MG tablet TAKE 2 TABLETS BY MOUTH EVERY DAY  . gabapentin (NEURONTIN) 800 MG tablet TAKE 1 TABLET BY MOUTH FOUR TIMES A DAY  . glipiZIDE (GLUCOTROL XL) 10 MG 24 hr tablet TAKE 1 TABLET BY MOUTH EVERY DAY WITH BREAKFAST  . hydrOXYzine (ATARAX/VISTARIL) 10 MG tablet Take 10 mg by mouth in the morning, at noon, and at bedtime.   . insulin glargine (LANTUS SOLOSTAR) 100 UNIT/ML Solostar Pen Inject 80 Units into the skin at bedtime.  Marland Kitchen ipratropium-albuterol (DUONEB) 0.5-2.5 (3) MG/3ML SOLN Take 3 mLs by nebulization every 6 (six) hours as needed. (Patient taking differently: Take 3 mLs by nebulization in the morning, at noon, and at bedtime. )  . KLOR-CON M20 20 MEQ tablet TAKE 1 TABLET BY MOUTH TWICE A DAY (Patient taking differently: Take 20 mEq by mouth 2 (two) times daily. )  .  Lancets (ONETOUCH DELICA PLUS [id number]) MISC TEST ONCE DAILY  . levothyroxine (SYNTHROID) 50 MCG tablet TAKE 1 TABLET BY MOUTH DAILY BEFORE BREAKFAST (Patient taking differently: Take 50 mcg by mouth daily before breakfast. )  . loratadine (CLARITIN) 10 MG tablet Take 1 tablet (10 mg total) by mouth daily.  . Magnesium Oxide 400 MG CAPS Take one (Patient taking differently: Take 400 mg by mouth at bedtime. Take one)  . meclizine (ANTIVERT) 25 MG tablet Take 25 mg by mouth in the morning, at noon, and at bedtime.   . metFORMIN (GLUCOPHAGE) 500 MG tablet Take 1 tablet (500 mg total) by mouth 2 (two) times daily with a meal.  . metoprolol tartrate (LOPRESSOR) 25 MG tablet Take 1 tablet (25 mg total) by mouth 2 (two) times daily.  . nitroGLYCERIN (NITROSTAT) 0.4 MG SL tablet Place 1 tablet (0.4 mg total) under the tongue every 5 (five) minutes as needed for chest pain.  Marland Kitchen ondansetron (ZOFRAN) 4 MG tablet Take 1 tablet (4 mg total) by mouth every 8 (eight) hours as needed for nausea or vomiting.  Glory Rosebush VERIO test strip USE AS INSTRUCTED FOUR TIMES DAILY TESTING DX E11.65  . oxyCODONE (OXYCONTIN) 40 mg 12 hr tablet Take 1 tablet (40 mg total) by mouth every 12 (twelve) hours.  Derrill Memo ON 12/11/2019] oxyCODONE (OXYCONTIN) 40 mg 12 hr tablet Take 1 tablet (40 mg total) by mouth every 12 (twelve) hours.  Derrill Memo ON 01/11/2020] oxyCODONE (OXYCONTIN) 40 mg 12 hr tablet Take 1 tablet (40 mg total) by mouth every 12 (twelve) hours.  Derrill Memo ON 12/10/2019] oxyCODONE (OXYCONTIN) 40 mg 12 hr tablet Take 1 tablet (40 mg total) by mouth every 12 (twelve) hours.  Derrill Memo ON 01/09/2020] oxyCODONE (OXYCONTIN) 40 mg 12 hr tablet Take one tablet by mouth two times daily for pain  . SYMBICORT 160-4.5 MCG/ACT inhaler TAKE 2 PUFFS BY MOUTH TWICE  A DAY (Patient taking differently: Inhale 2 puffs into the lungs in the morning and at bedtime. )  . UNABLE TO FIND Glucerna 114 U/M or PRN Chux 90/M or PRN Incontinence pads  198/M or PRN  Tub Mat x 1  Grab bars  . UNABLE TO FIND Reacher/Grabber x 1  Lift chair x 1  Lifeline X1  . UNABLE TO FIND SHARPS CONTAINER  1/Y BED SUPPORT RAIL    1/Y  . zolpidem (AMBIEN) 10 MG tablet Take 1 tablet (10 mg total) by mouth at bedtime.  Marland Kitchen zolpidem (AMBIEN) 10 MG tablet Take 1 tablet (10 mg total) by mouth at bedtime.  . [DISCONTINUED] insulin glargine (LANTUS SOLOSTAR) 100 UNIT/ML Solostar Pen Inject 50 Units into the skin 2 (two) times daily.   No facility-administered encounter medications on file as of 11/20/2019.    ALLERGIES: Allergies  Allergen Reactions  . Morphine Other (See Comments)    Reaction with esophagus, unable to swallow.   . Ace Inhibitors Cough  . Aspirin Other (See Comments)    Reaction with esophagus, unable to swallow.   . Losartan Cough  . Tapentadol Other (See Comments)    Nausea, increased sleepiness, h/a  . Tomato Other (See Comments)    Acid reflux due to acid in tomato  . Adhesive [Tape] Rash    VACCINATION STATUS: Immunization History  Administered Date(s) Administered  . Influenza Split 03/30/2011  . Influenza Whole 03/21/2005, 02/09/2006  . Influenza,inj,Quad PF,6+ Mos 02/11/2014, 07/27/2015, 03/09/2016, 01/29/2017, 02/07/2018, 01/30/2019  . Influenza-Unspecified 01/29/2017  . PFIZER SARS-COV-2 Vaccination 07/22/2019, 08/12/2019  . Pneumococcal Polysaccharide-23 04/17/2006, 09/14/2015  . Td 04/17/2006    Diabetes She presents for her follow-up diabetic visit. She has type 2 diabetes mellitus. Her disease course has been worsening. There are no hypoglycemic associated symptoms. Pertinent negatives for hypoglycemia include no confusion, headaches, pallor or seizures. Associated symptoms include fatigue, polydipsia and polyuria. Pertinent negatives for diabetes include no blurred vision, no chest pain and no polyphagia. There are no hypoglycemic complications. Symptoms are worsening. Diabetic complications include heart disease.  Risk factors for coronary artery disease include dyslipidemia, diabetes mellitus, family history, hypertension, obesity, sedentary lifestyle, tobacco exposure and post-menopausal. Current diabetic treatment includes oral agent (dual therapy). Her weight is decreasing steadily (She lost 9 pounds since last visit.). She is following a generally unhealthy diet. When asked about meal planning, she reported none. She has had a previous visit with a dietitian. She never participates in exercise. Her home blood glucose trend is increasing steadily. Her overall blood glucose range is >200 mg/dl. (She presents with her meter nor logs.  Her point-of-care A1c is 11.4%, unchanged from 11.8% last visit.  She denies hypoglycemia.  She reports her blood glucose readings are higher than 200 mg per DL.   ) An ACE inhibitor/angiotensin II receptor blocker is being taken. She does not see a podiatrist.Eye exam is not current.  Hyperlipidemia This is a chronic problem. The current episode started more than 1 year ago. The problem is controlled. Exacerbating diseases include diabetes and obesity. Associated symptoms include shortness of breath. Pertinent negatives include no chest pain or myalgias. Risk factors for coronary artery disease include diabetes mellitus, dyslipidemia, hypertension, obesity and a sedentary lifestyle.  Hypertension This is a chronic problem. The current episode started more than 1 year ago. The problem is uncontrolled. Associated symptoms include shortness of breath. Pertinent negatives include no blurred vision, chest pain, headaches or palpitations. Risk factors for coronary artery disease include family  history, dyslipidemia, diabetes mellitus, obesity, sedentary lifestyle and post-menopausal state. Past treatments include angiotensin blockers. Hypertensive end-organ damage includes CAD/MI and heart failure.    Review of systems  Constitutional: + Minimally fluctuating body weight,  current  Body  mass index is 54.34 kg/m. , no fatigue, no subjective hyperthermia, no subjective hypothermia Eyes: no blurry vision, no xerophthalmia ENT: no sore throat, no nodules palpated in throat, no dysphagia/odynophagia, no hoarseness Cardiovascular: no Chest Pain, no Shortness of Breath, no palpitations, no leg swelling Respiratory: no cough, no shortness of breath Gastrointestinal: no Nausea/Vomiting/Diarhhea Musculoskeletal: no muscle/joint aches Skin: no rashes, no hyperemia Neurological: no tremors, no numbness, no tingling, no dizziness Psychiatric: no depression, no anxiety    Objective:    BP 119/80   Pulse 80   Ht _0  (1.626 m)   Wt (!) 316 lb 9.6 oz (143.6 kg)   BMI 54.34 kg/m   Wt Readings from Last 3 Encounters:  11/20/19 (!) 316 lb 9.6 oz (143.6 kg)  11/10/19 (!) 324 lb 6.4 oz (147.1 kg)  10/22/19 (!) 323 lb (146.5 kg)       Physical Exam- Limited  Constitutional:  Body mass index is 54.34 kg/m. , not in acute distress, normal state of mind Eyes:  EOMI, no exophthalmos Neck: Supple Thyroid: No gross goiter Respiratory: Adequate breathing efforts Musculoskeletal: no gross deformities, strength intact in all four extremities, no gross restriction of joint movements Skin:  no rashes, no hyperemia Neurological: no tremor with outstretched hands.   CMP ( most recent) CMP     Component Value Date/Time   NA 138 11/10/2019 1303   K 4.3 11/10/2019 1303   CL 99 11/10/2019 1303   CO2 29 11/10/2019 1303   GLUCOSE 195 (H) 11/10/2019 1303   BUN 14 11/10/2019 1303   CREATININE 0.90 11/10/2019 1303   CALCIUM 9.3 11/10/2019 1303   PROT 7.8 11/10/2019 1303   ALBUMIN 4.0 06/05/2019 1019   AST 138 (H) 11/10/2019 1303   ALT 82 (H) 11/10/2019 1303   ALKPHOS 92 06/05/2019 1019   BILITOT 0.5 11/10/2019 1303   GFRNONAA 70 11/10/2019 1303   GFRAA 81 11/10/2019 1303    Diabetic Labs (most recent): Lab Results  Component Value Date   HGBA1C 11.4 (A) 11/20/2019   HGBA1C  11.8 (A) 07/16/2019   HGBA1C 13.0 (A) 04/02/2019     Lipid Panel ( most recent) Lipid Panel     Component Value Date/Time   CHOL 178 11/10/2019 1303   TRIG 244 (H) 11/10/2019 1303   HDL 44 (L) 11/10/2019 1303   CHOLHDL 4.0 11/10/2019 1303   VLDL 16 05/06/2018 0335   LDLCALC 97 11/10/2019 1303      Assessment & Plan:    1. Uncontrolled type 2 diabetes mellitus with hyperglycemia ([place])  - [place] has currently uncontrolled symptomatic type 2 DM since  59 years of age.  She presents with her meter nor logs.  Her point-of-care A1c is 11.4%, unchanged from 11.8% last visit.  She denies hypoglycemia.  She reports her blood glucose readings are higher than 200 mg per DL.   -  She did have A1c of greater than 14% last year.     Recent labs reviewed.  -her diabetes is complicated by coronary artery disease, CHF, obesity/sedentary life and she remains at a high risk for more acute and chronic complications which include CAD, CVA, CKD, retinopathy, and neuropathy. These are all discussed in detail with her.  - I have counseled  her on diet management and weight loss, by adopting a carbohydrate restricted/protein rich diet.  - she  admits there is a room for improvement in her diet and drink choices. -  Suggestion is made for her to avoid simple carbohydrates  from her diet including Cakes, Sweet Desserts / Pastries, Ice Cream, Soda (diet and regular), Sweet Tea, Candies, Chips, Cookies, Sweet Pastries,  Store Bought Juices, Alcohol in Excess of  1-2 drinks a day, Artificial Sweeteners, Coffee Creamer, and "Sugar-free" Products. This will help patient to have stable blood glucose profile and potentially avoid unintended weight gain.  - I encouraged her to switch to  unprocessed or minimally processed complex starch and increased protein intake (animal or plant source), fruits, and vegetables.  - she is advised to stick to a routine mealtimes to eat 3 meals  a day and avoid  unnecessary snacks ( to snack only to correct hypoglycemia).   - she has been scheduled with Jearld Fenton, RDN, CDE for individualized diabetes education.  - I have approached her with the following individualized plan to manage diabetes and patient agrees:   -Given her presentation with chronic glycemic burden, she will likely require multiple daily injections of insulin in order for her to achieve control of diabetes to target. However, this patient is not committed for proper monitoring of blood glucose for safe use of insulin. -She is currently being assisted by her daughter, and promises to do better. -She is approached to adjust her Lantus to 80 units nightly,  associated with monitoring of blood glucose 4 times a day-daily before meals and at bedtime and return in 2 weeks with her meter and logs for reevaluation.    - she is encouraged to call clinic for blood glucose levels less than 70 or above 200 mg /dl.  -She is advised to lower her Metformin to 500 mg p.o. twice daily due to CKD. -She is benefiting  glipizide.  She is advised to continue glipizide 10 mg p.o. daily at breakfast. - she is warned not to take insulin without proper monitoring per orders.   2) Blood Pressure /Hypertension: -Her blood pressure is controlled to target.    she is advised to continue her current medications including amlodipine 10 mg p.o. daily, carvedilol 12.5 mg p.o. twice daily, clonidine 0.3 mg/24-hour patch, clonidine 0.3 mg daily at bedtime, hydralazine 25 mg p.o. twice daily.  She will need work-up to rule out primary hypertension on subsequent visits.  3) Lipids/Hyperlipidemia:   Review of her recent lipid panel showed  controlled  LDL at 97.  she  is advised to continue Crestor 40 mg p.o. daily at bedtime.     Side effects and precautions discussed with her.  4)  Weight/Diet: Her current BMI is 16.1  - clearly complicating her diabetes care.  She is a candidate for modest weight loss.  She has  multiple comorbid situations complicating her chance of bariatric surgery.  I discussed with her the fact that loss of 5 - 10% of her  current body weight will have the most impact on her diabetes management.  CDE Consult will be initiated . Exercise, and detailed carbohydrates information provided  -  detailed on discharge instructions.  5) hypothyroidism:   -She was recently initiated on levothyroxine currently 50 mcg p.o. daily before breakfast.  - We discussed about the correct intake of her thyroid hormone, on empty stomach at fasting, with water, separated by at least 30 minutes from breakfast and other medications,  and separated by more than 4 hours from calcium, iron, multivitamins, acid reflux medications (PPIs). -Patient is made aware of the fact that thyroid hormone replacement is needed for life, dose to be adjusted by periodic monitoring of thyroid function tests.   6) Chronic Care/Health Maintenance:  -she  is on Statin medications and  is encouraged to initiate and continue to follow up with Ophthalmology, Dentist,  Podiatrist at least yearly or according to recommendations, and advised to  stay away from smoking. I have recommended yearly flu vaccine and pneumonia vaccine at least every 5 years; moderate intensity exercise for up to 150 minutes weekly; and  sleep for at least 7 hours a day.  - she is  advised to maintain close follow up with Fayrene Helper, MD for primary care needs, as well as her other providers for optimal and coordinated care.  - Time spent on this patient care encounter:  45 min, of which > 50% was spent in  counseling and the rest reviewing her blood glucose logs , discussing her hypoglycemia and hyperglycemia episodes, reviewing her current and  previous labs / studies  ( including abstraction from other facilities) and medications  doses and developing a  long term treatment plan and documenting her care.   Please refer to Patient Instructions for Blood  Glucose Monitoring and Insulin/Medications Dosing Guide"  in media tab for additional information. Please  also refer to " Patient Self Inventory" in the Media  tab for reviewed elements of pertinent patient history.  [place] participated in the discussions, expressed understanding, and voiced agreement with the above plans.  All questions were answered to her satisfaction. she is encouraged to contact clinic should she have any questions or concerns prior to her return visit.   Follow up plan: - Return in about 2 weeks (around 12/04/2019) for F/U with Meter and Logs Only - no Labs.  Glade Lloyd, MD [facility] [address] [place], [place] [address] Phone: [phone]  Fax: [phone]    11/20/2019, 4:51 PM  This note was partially dictated with voice recognition software. Similar sounding words can be transcribed inadequately or may not  be corrected upon review.

## 2019-11-20 NOTE — Patient Instructions (Signed)

## 2019-11-24 ENCOUNTER — Ambulatory Visit: Payer: Medicare Other

## 2019-11-24 ENCOUNTER — Other Ambulatory Visit: Payer: Self-pay

## 2019-11-24 LAB — HM DIABETES EYE EXAM

## 2019-11-25 ENCOUNTER — Telehealth: Payer: Self-pay | Admitting: "Endocrinology

## 2019-11-25 ENCOUNTER — Other Ambulatory Visit: Payer: Medicare Other | Admitting: Orthotics

## 2019-11-25 NOTE — Telephone Encounter (Signed)
I am not sure who patient is talking about calling her, the lab result is in there.

## 2019-11-25 NOTE — Telephone Encounter (Signed)
Patient is calling and states Dr. Moshe Cipro office keeps calling her asking what her A1C is and states they can not see it in the chart. Can you check on this.

## 2019-11-25 NOTE — Telephone Encounter (Signed)
We have not called the patient asking for her a1c. It may be the insurance calling her. The results are in her chart

## 2019-11-25 NOTE — Telephone Encounter (Signed)
Discussed with pt that Dr.Simpson is able to see her lab results and it is likely that it is her insurance company calling to remind her to have a HgbA1c performed. Understanding voiced.

## 2019-12-04 ENCOUNTER — Other Ambulatory Visit: Payer: Self-pay

## 2019-12-04 ENCOUNTER — Encounter: Payer: Self-pay | Admitting: Nurse Practitioner

## 2019-12-04 ENCOUNTER — Ambulatory Visit (INDEPENDENT_AMBULATORY_CARE_PROVIDER_SITE_OTHER): Payer: Medicare Other | Admitting: Nurse Practitioner

## 2019-12-04 VITALS — BP 128/74 | HR 69 | Ht 64.0 in | Wt 318.4 lb

## 2019-12-04 DIAGNOSIS — I10 Essential (primary) hypertension: Secondary | ICD-10-CM

## 2019-12-04 DIAGNOSIS — E782 Mixed hyperlipidemia: Secondary | ICD-10-CM

## 2019-12-04 DIAGNOSIS — E1165 Type 2 diabetes mellitus with hyperglycemia: Secondary | ICD-10-CM

## 2019-12-04 DIAGNOSIS — E039 Hypothyroidism, unspecified: Secondary | ICD-10-CM

## 2019-12-04 MED ORDER — INSULIN ASPART PROT & ASPART (70-30 MIX) 100 UNIT/ML ~~LOC~~ SUSP: 60.0000 [IU] | Freq: Two times a day (BID) | SUBCUTANEOUS | 11 refills | Status: DC

## 2019-12-04 NOTE — Patient Instructions (Signed)

## 2019-12-04 NOTE — Progress Notes (Signed)
12/04/2019, 11:49 AM                    Endocrinology follow-up note   Subjective:    [name], female    DOB: 01-21-1961.  [name] is being seen in follow-up in the management of currently uncontrolled symptomatic type 2 diabetes.  She also has comorbid dyslipidemia, hypertension.   PMD:  Fayrene Helper, MD.   Past Medical History:  Diagnosis Date   Anxiety    Arthritis    Asthma    Coronary atherosclerosis of native coronary artery    Mild nonobstructive CAD by cardiac catheterization 2008 - Dr. Terrence Dupont   Degenerative disc disease    Depression    Dysrhythmia    Essential hypertension, benign    Folliculitis 4/74/2595   GERD (gastroesophageal reflux disease)    H/O hiatal hernia    Helicobacter pylori gastritis 2008   Hypercholesterolemia    Hypertension    Hyperthyroidism    s/p radiation   Left-sided epistaxis 05/15/2018   Low back pain    Migraine    Nephrolithiasis    Recurring episodes since 2004   Severe obesity (BMI >= 40) (HCC)    Sleep apnea    STOP BANG SCORE 6no cpap used   Type 2 diabetes mellitus with diabetic neuropathy ([place])     Past Surgical History:  Procedure Laterality Date   [place]  2000 [facility]   INT HEMORRHOIDS/FISSURE   COLONOSCOPY  2003 [facility], CHANGE IN BOWEL HABITS   INT HEMORRHOIDS   COLONOSCOPY  2006 [facility]   INT HEMORRHOIDS   COLONOSCOPY  2007 [facility]   INT HEMORRHOIDS   CYSTOSCOPY WITH STENT PLACEMENT Left 07/25/2012   Procedure: CYSTOSCOPY, left retograde pyelogram WITH left ureteral  STENT PLACEMENT;  Surgeon: Claybon Jabs, MD;  Location: [facility];  Service: Urology;  Laterality: Left;   CYSTOSCOPY/RETROGRADE/URETEROSCOPY  12/04/2011   Procedure:  CYSTOSCOPY/RETROGRADE/URETEROSCOPY;  Surgeon: Molli Hazard, MD;  Location: [facility];  Service: Urology;  Laterality: Right;   CYSTOSCOPY/RETROGRADE/URETEROSCOPY/STONE EXTRACTION WITH BASKET Left 09/03/2012   Procedure: CYSTOSCOPY/RETROGRADE pyelogram/digital URETEROSCOPY/STONE EXTRACTION WITH BASKET, left stent removal;  Surgeon: Molli Hazard, MD;  Location: [facility];  Service: Urology;  Laterality: Left;   ESOPHAGOGASTRODUODENOSCOPY  2008   GLO:VFIEP hiatal hernia./Normal esophagus without evidence of Barrett's mass, stricture, erosion or ulceration./Normal duodenal bulb and second portion of the duodenum./Diffuse erythema in the body and the antrum with occasional erosion.  Biopsies obtained via cold forceps to evaluate for H. pylori gastritis   FRACTURE SURGERY  1999   right clavicle   HOLMIUM LASER APPLICATION Left 06/30/9516   Procedure: HOLMIUM LASER APPLICATION;  Surgeon: Molli Hazard, MD;  Location: [facility];  Service: Urology;  Laterality: Left;   KNEE ARTHROSCOPY  10/04/2010,    right knee arthroscopy, dr Theda Sers   LEFT HEART CATH AND CORONARY ANGIOGRAPHY N/A 05/08/2018   Procedure: LEFT HEART CATH AND CORONARY ANGIOGRAPHY;  Surgeon: Troy Sine, MD;  Location: [place] [facility];  Service: Cardiovascular;  Laterality: N/A;   left knee arthroscopic surgery  1999   Left salphingectomy secondary to ectopic pregnancy  1991   PARTIAL HYSTERECTOMY  [place] CATH N/A 07/07/2019   Procedure: RIGHT HEART CATH;  Surgeon: Leonie Man, MD;  Location: [place] [facility];  Service: Cardiovascular;  Laterality: N/A;   rotary cuff  Right 05/14/2014   [facility]   UPPER GASTROINTESTINAL ENDOSCOPY  07/09/06 abd pain   H. pylori gastritis   UPPER GASTROINTESTINAL ENDOSCOPY  [place] [place]   PUD    Social History   Socioeconomic History   Marital status: Single    Spouse name: Not on file   Number of children: 1   Years of education: 12   Highest  education level: 12th grade  Occupational History   Occupation: Disable   Tobacco Use   Smoking status: Never Smoker   Smokeless tobacco: Never Used  Scientific laboratory technician Use: Never used  Substance and Sexual Activity   Alcohol use: No   Drug use: No   Sexual activity: Not Currently    Partners: Male    Birth control/protection: Surgical    Comment: hyst  Other Topics Concern   Not on file  Social History Narrative   Lives alone    Social Determinants of Health   Financial Resource Strain:    Difficulty of Paying Living Expenses:   Food Insecurity:    Worried About Charity fundraiser in the Last Year:    Arboriculturist in the Last Year:   Transportation Needs:    Film/video editor (Medical):    Lack of Transportation (Non-Medical):   Physical Activity:    Days of Exercise per Week:    Minutes of Exercise per Session:   Stress:    Feeling of Stress :   Social Connections:    Frequency of Communication with Friends and Family:    Frequency of Social Gatherings with Friends and Family:    Attends Religious Services:    Active Member of Clubs or Organizations:    Attends Music therapist:    Marital Status:     Family History  Problem Relation Age of Onset   Heart disease Mother    Hypertension Mother    Cancer Mother        Cervical    Asthma Mother    Heart attack Father    Cancer Father        Prostate   Colon cancer Father        DECEASED AGE 07/10/2059   Colon polyps Neg Hx     Outpatient Encounter Medications as of 12/04/2019  Medication Sig   albuterol (VENTOLIN HFA) 108 (90 Base) MCG/ACT inhaler Inhale 2 puffs into the lungs every 6 (six) hours as needed for wheezing or shortness of breath.   amLODipine (NORVASC) 10 MG tablet TAKE 1 TABLET BY MOUTH EVERY DAY   B-D UF III MINI PEN NEEDLES 31G X 5 MM MISC USE ONCE DAILY AS DIRECTED   betamethasone, augmented, (DIPROLENE) 0.05 % lotion    blood glucose meter  kit and supplies Dispense based on patient and insurance preference. Once daily testing dx. e11.9   calcipotriene (DOVONOX) 0.005 % ointment Apply 1 application topically daily.    cloNIDine (CATAPRES - DOSED IN MG/24 HR) 0.3 mg/24hr patch APPLY 1 PATCH TO SKIN ONCE A WEEK   cloNIDine (CATAPRES) 0.3 MG tablet Take 1 tablet (0.3 mg total) by mouth at bedtime.   cyclobenzaprine (FLEXERIL) 10 MG tablet TAKE 1 TABLET BY MOUTH THREE TIMES A DAY   diazepam (VALIUM) 5  MG tablet Take one tablet by mouth three times daily for back spasm   DULoxetine (CYMBALTA) 60 MG capsule TAKE 1 CAPSULE BY MOUTH TWICE A DAY (Patient taking differently: Take 60 mg by mouth 2 (two) times daily. )   ergocalciferol (VITAMIN D2) 1.25 MG (50000 UT) capsule Take 1 capsule (50,000 Units total) by mouth once a week. One capsule once weekly (Patient taking differently: Take 50,000 Units by mouth every Monday. )   esomeprazole (NEXIUM) 40 MG capsule TAKE 1 CAPSULE BY MOUTH EVERY DAY   FLUoxetine (PROZAC) 20 MG capsule Take by mouth.   fluticasone (FLONASE) 50 MCG/ACT nasal spray Place 2 sprays into both nostrils daily.   furosemide (LASIX) 20 MG tablet TAKE 2 TABLETS BY MOUTH EVERY DAY   gabapentin (NEURONTIN) 800 MG tablet TAKE 1 TABLET BY MOUTH FOUR TIMES A DAY   glipiZIDE (GLUCOTROL XL) 10 MG 24 hr tablet TAKE 1 TABLET BY MOUTH EVERY DAY WITH BREAKFAST   hydrOXYzine (ATARAX/VISTARIL) 10 MG tablet Take 10 mg by mouth in the morning, at noon, and at bedtime.    ipratropium-albuterol (DUONEB) 0.5-2.5 (3) MG/3ML SOLN Take 3 mLs by nebulization every 6 (six) hours as needed. (Patient taking differently: Take 3 mLs by nebulization in the morning, at noon, and at bedtime. )   KLOR-CON M20 20 MEQ tablet TAKE 1 TABLET BY MOUTH TWICE A DAY (Patient taking differently: Take 20 mEq by mouth 2 (two) times daily. )   Lancets (ONETOUCH DELICA PLUS [id number]) MISC TEST ONCE DAILY   levothyroxine (SYNTHROID) 50 MCG tablet TAKE 1  TABLET BY MOUTH DAILY BEFORE BREAKFAST (Patient taking differently: Take 50 mcg by mouth daily before breakfast. )   loratadine (CLARITIN) 10 MG tablet Take 1 tablet (10 mg total) by mouth daily.   Magnesium Oxide 400 MG CAPS Take one (Patient taking differently: Take 400 mg by mouth at bedtime. Take one)   meclizine (ANTIVERT) 25 MG tablet Take 25 mg by mouth in the morning, at noon, and at bedtime.    metFORMIN (GLUCOPHAGE) 500 MG tablet Take 1 tablet (500 mg total) by mouth 2 (two) times daily with a meal.   metoprolol tartrate (LOPRESSOR) 25 MG tablet Take 1 tablet (25 mg total) by mouth 2 (two) times daily.   nitroGLYCERIN (NITROSTAT) 0.4 MG SL tablet Place 1 tablet (0.4 mg total) under the tongue every 5 (five) minutes as needed for chest pain.   ondansetron (ZOFRAN) 4 MG tablet Take 1 tablet (4 mg total) by mouth every 8 (eight) hours as needed for nausea or vomiting.   ONETOUCH VERIO test strip USE AS INSTRUCTED FOUR TIMES DAILY TESTING DX E11.65   [START ON 12/11/2019] oxyCODONE (OXYCONTIN) 40 mg 12 hr tablet Take 1 tablet (40 mg total) by mouth every 12 (twelve) hours.   SYMBICORT 160-4.5 MCG/ACT inhaler TAKE 2 PUFFS BY MOUTH TWICE A DAY (Patient taking differently: Inhale 2 puffs into the lungs in the morning and at bedtime. )   UNABLE TO FIND Glucerna 114 U/M or PRN Chux 90/M or PRN Incontinence pads 198/M or PRN  Tub Mat x 1  Grab bars   UNABLE TO FIND Reacher/Grabber x 1  Lift chair x 1  Lifeline X1   UNABLE TO FIND SHARPS CONTAINER  1/Y BED SUPPORT RAIL    1/Y   zolpidem (AMBIEN) 10 MG tablet Take 1 tablet (10 mg total) by mouth at bedtime.   [DISCONTINUED] insulin glargine (LANTUS SOLOSTAR) 100 UNIT/ML Solostar Pen Inject 80 Units into  the skin at bedtime.   [DISCONTINUED] oxyCODONE (OXYCONTIN) 40 mg 12 hr tablet Take 1 tablet (40 mg total) by mouth every 12 (twelve) hours.   [DISCONTINUED] oxyCODONE (OXYCONTIN) 40 mg 12 hr tablet Take 1 tablet (40 mg total) by  mouth every 12 (twelve) hours.   [DISCONTINUED] oxyCODONE (OXYCONTIN) 40 mg 12 hr tablet Take 1 tablet (40 mg total) by mouth every 12 (twelve) hours.   [DISCONTINUED] oxyCODONE (OXYCONTIN) 40 mg 12 hr tablet Take one tablet by mouth two times daily for pain   insulin aspart protamine- aspart (NOVOLOG MIX 70/30) (70-30) 100 UNIT/ML injection Inject 0.6 mLs (60 Units total) into the skin 2 (two) times daily with a meal.   zolpidem (AMBIEN) 10 MG tablet Take 1 tablet (10 mg total) by mouth at bedtime.   No facility-administered encounter medications on file as of 12/04/2019.    ALLERGIES: Allergies  Allergen Reactions   Morphine Other (See Comments)    Reaction with esophagus, unable to swallow.    Ace Inhibitors Cough   Aspirin Other (See Comments)    Reaction with esophagus, unable to swallow.    Losartan Cough   Tapentadol Other (See Comments)    Nausea, increased sleepiness, h/a   Tomato Other (See Comments)    Acid reflux due to acid in tomato   Adhesive [Tape] Rash    VACCINATION STATUS: Immunization History  Administered Date(s) Administered   Influenza Split 03/30/2011   Influenza Whole 03/21/2005, 02/09/2006   Influenza,inj,Quad PF,6+ Mos 02/11/2014, 07/27/2015, 03/09/2016, 01/29/2017, 02/07/2018, 01/30/2019   Influenza-Unspecified 01/29/2017   PFIZER SARS-COV-2 Vaccination 07/22/2019, 08/12/2019   Pneumococcal Polysaccharide-23 04/17/2006, 09/14/2015   Td 04/17/2006    Diabetes She presents for her follow-up diabetic visit. She has type 2 diabetes mellitus. Her disease course has been improving. There are no hypoglycemic associated symptoms. Pertinent negatives for hypoglycemia include no confusion, headaches, pallor or seizures. Associated symptoms include fatigue, polydipsia and polyuria. Pertinent negatives for diabetes include no blurred vision, no chest pain and no polyphagia. There are no hypoglycemic complications. Symptoms are worsening. Diabetic  complications include heart disease and peripheral neuropathy. Risk factors for coronary artery disease include dyslipidemia, diabetes mellitus, family history, hypertension, obesity, sedentary lifestyle, tobacco exposure and post-menopausal. Current diabetic treatment includes oral agent (dual therapy) and insulin injections. She is compliant with treatment some of the time. Her weight is decreasing steadily (She lost 9 pounds since last visit.). She is following a generally unhealthy (Attempting to make better changes in her diet) diet. Meal planning includes avoidance of concentrated sweets. She has had a previous visit with a dietitian. She never participates in exercise. Her home blood glucose trend is fluctuating minimally. Her breakfast blood glucose range is generally >200 mg/dl. Her bedtime blood glucose range is generally >200 mg/dl. Her overall blood glucose range is >200 mg/dl. (She presents today accompanied by her daughter without her logs but she does bring her meter.  Her meter shows 7-day average of 254, testing 9 times; 14-day average of 283, testing 22 times; and 30-day average of 281, testing 30 times.  This is an improvement from previous visit.  She has recently made significant changes in her lifestyle and diet.  There are no documented or reported episodes of hypoglycemia.  ) An ACE inhibitor/angiotensin II receptor blocker is being taken. She does not see a podiatrist.Eye exam is not current.  Hyperlipidemia This is a chronic problem. The current episode started more than 1 year ago. The problem is controlled. Exacerbating diseases  include diabetes and obesity. Pertinent negatives include no chest pain, myalgias or shortness of breath. She is currently on no antihyperlipidemic treatment. Compliance problems include adherence to diet.  Risk factors for coronary artery disease include diabetes mellitus, dyslipidemia, hypertension, obesity and a sedentary lifestyle.  Hypertension This is  a chronic problem. The current episode started more than 1 year ago. The problem is controlled. Associated symptoms include peripheral edema. Pertinent negatives include no blurred vision, chest pain, headaches, palpitations or shortness of breath. Risk factors for coronary artery disease include family history, dyslipidemia, diabetes mellitus, obesity, sedentary lifestyle and post-menopausal state. Past treatments include calcium channel blockers, diuretics and beta blockers. The current treatment provides significant improvement. Compliance problems include diet.  Hypertensive end-organ damage includes CAD/MI and heart failure.    Review of systems  Constitutional: + Minimally fluctuating body weight,  current  Body mass index is 54.65 kg/m. , no fatigue, no subjective hyperthermia, no subjective hypothermia Eyes: no blurry vision, no xerophthalmia ENT: no sore throat, no nodules palpated in throat, no dysphagia/odynophagia, no hoarseness Cardiovascular: no Chest Pain, no Shortness of Breath, no palpitations, no leg swelling Respiratory: no cough, no shortness of breath Gastrointestinal: no Nausea/Vomiting/Diarhhea Musculoskeletal: no muscle/joint aches Skin: no rashes, no hyperemia Neurological: no tremors, no numbness, no tingling, no dizziness Psychiatric: no depression, no anxiety    Objective:    BP 128/74 (BP Location: Left Arm, Patient Position: Sitting)    Pulse 69    Ht _0  (1.626 m)    Wt (!) 318 lb 6.4 oz (144.4 kg)    BMI 54.65 kg/m   Wt Readings from Last 3 Encounters:  12/04/19 (!) 318 lb 6.4 oz (144.4 kg)  11/20/19 (!) 316 lb 9.6 oz (143.6 kg)  11/10/19 (!) 324 lb 6.4 oz (147.1 kg)        Physical Exam- Limited  Constitutional:  Body mass index is 54.65 kg/m. , not in acute distress, normal state of mind Eyes:  EOMI, no exophthalmos Neck: Supple Thyroid: No gross goiter Cardiovascular: RRR, no murmers, rubs, or gallops, no edema Respiratory: Adequate  breathing efforts, no crackles, rales, rhonchi, or wheezing Musculoskeletal: no gross deformities, strength intact in all four extremities, no gross restriction of joint movements Skin: Psoriatic rash to bilateral lower extremities. Neurological: no tremor with outstretched hands    CMP ( most recent) CMP     Component Value Date/Time   NA 138 11/10/2019 1303   K 4.3 11/10/2019 1303   CL 99 11/10/2019 1303   CO2 29 11/10/2019 1303   GLUCOSE 195 (H) 11/10/2019 1303   BUN 14 11/10/2019 1303   CREATININE 0.90 11/10/2019 1303   CALCIUM 9.3 11/10/2019 1303   PROT 7.8 11/10/2019 1303   ALBUMIN 4.0 06/05/2019 1019   AST 138 (H) 11/10/2019 1303   ALT 82 (H) 11/10/2019 1303   ALKPHOS 92 06/05/2019 1019   BILITOT 0.5 11/10/2019 1303   GFRNONAA 70 11/10/2019 1303   GFRAA 81 11/10/2019 1303    Diabetic Labs (most recent): Lab Results  Component Value Date   HGBA1C 11.4 (A) 11/20/2019   HGBA1C 11.8 (A) 07/16/2019   HGBA1C 13.0 (A) 04/02/2019     Lipid Panel ( most recent) Lipid Panel     Component Value Date/Time   CHOL 178 11/10/2019 1303   TRIG 244 (H) 11/10/2019 1303   HDL 44 (L) 11/10/2019 1303   CHOLHDL 4.0 11/10/2019 1303   VLDL 16 05/06/2018 0335   LDLCALC 97 11/10/2019 1303  Assessment & Plan:    1. Uncontrolled type 2 diabetes mellitus with hyperglycemia ([place])  - [place] has currently uncontrolled symptomatic type 2 DM since  59 years of age.  She presents today accompanied by her daughter without her logs but she does bring her meter.  Her meter shows 7-day average of 254, testing 9 times; 14-day average of 283, testing 22 times; and 30-day average of 281, testing 30 times.  This is an improvement from previous visit.  She has recently made significant changes in her lifestyle and diet.  There are no documented or reported episodes of hypoglycemia.  -  She did have A1c of greater than 14% last year.     Recent labs reviewed.  -her diabetes is  complicated by coronary artery disease, CHF, obesity/sedentary life and she remains at a high risk for more acute and chronic complications which include CAD, CVA, CKD, retinopathy, and neuropathy. These are all discussed in detail with her.  - The patient admits there is a room for improvement in their diet and drink choices. -  Suggestion is made for the patient to avoid simple carbohydrates from their diet including Cakes, Sweet Desserts / Pastries, Ice Cream, Soda (diet and regular), Sweet Tea, Candies, Chips, Cookies, Sweet Pastries,  Store Bought Juices, Alcohol in Excess of  1-2 drinks a day, Artificial Sweeteners, Coffee Creamer, and "Sugar-free" Products. This will help patient to have stable blood glucose profile and potentially avoid unintended weight gain.   - I encouraged the patient to switch to  unprocessed or minimally processed complex starch and increased protein intake (animal or plant source), fruits, and vegetables.   - Patient is advised to stick to a routine mealtimes to eat 3 meals  a day and avoid unnecessary snacks ( to snack only to correct hypoglycemia).  - she has previously been scheduled with Jearld Fenton, RDN, CDE for individualized diabetes education.  - I have approached her with the following individualized plan to manage diabetes and patient agrees:   -Given her presentation with chronic glycemic burden, she will likely require multiple daily injections of insulin in order for her to achieve control of diabetes to target. -She is recently more engaged in her care, monitoring blood sugar more frequently and modifying diet.  Therefore, I have discussed and initiated NovoLog 70/30 60 units SQ twice daily with breakfast and supper if blood glucose above 90 and eating to gain better control of her diabetes.  She is advised to continue what Lantus she has left before initiating this new treatment. Advised to continue Metformin 500 mg p.o. twice daily with meals and  glipizide 10 mg daily with breakfast, even when starting the new treatment. -She is currently being assisted by her daughter, and promises to do better. - she is encouraged to call clinic for blood glucose levels less than 70 or above 300 mg /dl.  - she is warned not to take insulin without proper monitoring per orders.   2) Blood Pressure /Hypertension: Her blood pressure is controlled to target.  She is advised to continue Norvasc 10 mg p.o. daily, clonidine 0.3 mg patch once weekly, clonidine 0.3 mg tablet p.o. daily at bedtime, Lasix 40 mg p.o. daily, and Lopressor 25 mg p.o. twice daily.  3) Lipids/Hyperlipidemia:  Review of recent lipid panel shows controlled LDL at 97.  It does not appear that she is on anything to lower her lipids.  Will consider adding statin on subsequent visits if needed.  4)  Weight/Diet:  Her current BMI is 29.5  - clearly complicating her diabetes care.  She is a candidate for modest weight loss.  She has multiple comorbid situations complicating her chance of bariatric surgery.  I discussed with her the fact that loss of 5 - 10% of her  current body weight will have the most impact on her diabetes management.  CDE Consult will be initiated . Exercise, and detailed carbohydrates information provided  -  detailed on discharge instructions.  5) hypothyroidism:   -She was recently initiated on levothyroxine currently 50 mcg p.o. daily before breakfast.  - We discussed about the correct intake of her thyroid hormone, on empty stomach at fasting, with water, separated by at least 30 minutes from breakfast and other medications,  and separated by more than 4 hours from calcium, iron, multivitamins, acid reflux medications (PPIs). -Patient is made aware of the fact that thyroid hormone replacement is needed for life, dose to be adjusted by periodic monitoring of thyroid function tests.  6) Chronic Care/Health Maintenance:  -she  is not on ACE/ARB, statin medications  and  is encouraged to initiate and continue to follow up with Ophthalmology, Dentist,  Podiatrist at least yearly or according to recommendations, and advised to  stay away from smoking. I have recommended yearly flu vaccine and pneumonia vaccine at least every 5 years; moderate intensity exercise for up to 150 minutes weekly; and  sleep for at least 7 hours a day.  - she is  advised to maintain close follow up with Fayrene Helper, MD for primary care needs, as well as her other providers for optimal and coordinated care.  - Time spent on this patient care encounter:  35 min, of which > 50% was spent in  counseling and the rest reviewing her blood glucose logs , discussing her hypoglycemia and hyperglycemia episodes, reviewing her current and  previous labs / studies  ( including abstraction from other facilities) and medications  doses and developing a  long term treatment plan and documenting her care.   Please refer to Patient Instructions for Blood Glucose Monitoring and Insulin/Medications Dosing Guide"  in media tab for additional information. Please  also refer to " Patient Self Inventory" in the Media  tab for reviewed elements of pertinent patient history.  [place] participated in the discussions, expressed understanding, and voiced agreement with the above plans.  All questions were answered to her satisfaction. she is encouraged to contact clinic should she have any questions or concerns prior to her return visit.  Follow up plan: - Return in about 3 months (around 03/05/2020) for Diabetes follow up with A1c in office, No previsit labs, Bring glucometer and logs.  Rayetta Pigg, FNP-BC [facility] Phone: [phone] Fax: [phone]   12/04/2019, 11:49 AM  This note was partially dictated with voice recognition software. Similar sounding words can be transcribed inadequately or may not  be corrected upon review.

## 2019-12-05 ENCOUNTER — Ambulatory Visit: Payer: Medicare Other | Admitting: Orthotics

## 2019-12-05 ENCOUNTER — Other Ambulatory Visit: Payer: Self-pay | Admitting: Adult Health

## 2019-12-05 ENCOUNTER — Other Ambulatory Visit: Payer: Self-pay | Admitting: Family Medicine

## 2019-12-05 DIAGNOSIS — L84 Corns and callosities: Secondary | ICD-10-CM

## 2019-12-05 DIAGNOSIS — Q828 Other specified congenital malformations of skin: Secondary | ICD-10-CM

## 2019-12-05 NOTE — Progress Notes (Signed)
Shoes cramping toes even after trimming f/o; exchanging for 1/2 size larger, xwide.

## 2019-12-10 ENCOUNTER — Other Ambulatory Visit: Payer: Self-pay | Admitting: "Endocrinology

## 2019-12-10 ENCOUNTER — Other Ambulatory Visit: Payer: Self-pay | Admitting: Family Medicine

## 2019-12-10 ENCOUNTER — Telehealth: Payer: Self-pay

## 2019-12-10 NOTE — Progress Notes (Unsigned)
Oxycodone 40

## 2019-12-10 NOTE — Telephone Encounter (Signed)
Pt requesting a refill on Oxycodone to [facility] [address]

## 2019-12-10 NOTE — Telephone Encounter (Signed)
Left generic message that prescription would be filled tomorrow.

## 2019-12-10 NOTE — Telephone Encounter (Signed)
Has script already sent to fill 12/11/2019 plas let her know

## 2019-12-10 NOTE — Telephone Encounter (Signed)
Pt said the refill request was sent to pharmacy for Sept. And Oct. But not for August

## 2019-12-18 ENCOUNTER — Other Ambulatory Visit: Payer: Self-pay

## 2019-12-18 ENCOUNTER — Other Ambulatory Visit: Payer: Self-pay | Admitting: "Endocrinology

## 2019-12-23 ENCOUNTER — Other Ambulatory Visit: Payer: Self-pay | Admitting: Family Medicine

## 2019-12-24 ENCOUNTER — Other Ambulatory Visit: Payer: Self-pay | Admitting: Family Medicine

## 2019-12-24 DIAGNOSIS — I10 Essential (primary) hypertension: Secondary | ICD-10-CM

## 2019-12-25 LAB — HM DIABETES EYE EXAM

## 2019-12-27 ENCOUNTER — Encounter: Payer: Self-pay | Admitting: Adult Health

## 2019-12-28 ENCOUNTER — Other Ambulatory Visit: Payer: Self-pay | Admitting: "Endocrinology

## 2019-12-31 ENCOUNTER — Ambulatory Visit (INDEPENDENT_AMBULATORY_CARE_PROVIDER_SITE_OTHER): Payer: Medicare Other | Admitting: Adult Health

## 2019-12-31 ENCOUNTER — Other Ambulatory Visit: Payer: Self-pay

## 2019-12-31 ENCOUNTER — Encounter: Payer: Self-pay | Admitting: Adult Health

## 2019-12-31 VITALS — BP 146/101 | HR 101 | Ht 64.0 in | Wt 317.4 lb

## 2019-12-31 DIAGNOSIS — G4733 Obstructive sleep apnea (adult) (pediatric): Secondary | ICD-10-CM

## 2019-12-31 DIAGNOSIS — Z9989 Dependence on other enabling machines and devices: Secondary | ICD-10-CM

## 2019-12-31 NOTE — Patient Instructions (Signed)
Continue using CPAP nightly and greater than 4 hours each night °If your symptoms worsen or you develop new symptoms please let us know.  ° °

## 2019-12-31 NOTE — Progress Notes (Signed)
PATIENT: Angela [name] DOB: 05/26/1960  REASON FOR VISIT: follow up HISTORY FROM: patient  HISTORY OF PRESENT ILLNESS: Today 12/31/19:  Angela [name] is a 59 year old female with a history of obstructive sleep apnea on CPAP.  Her download indicates that she use her machine nightly for compliance of 100%.  She used her machine greater than 4 hours 29 days for compliance of 97%.  On average she uses her machine 7 hours and 19 minutes.  Her residual AHI is 0.8 on 10 cm of water with EPR 3.  Leak in the 95th percentile is 20.  She reports that CPAP is working well for her.  She returns today for an evaluation.  HISTORY 12/31/18:  Angela [name] is a 59 year old female with a history of obstructive sleep apnea on CPAP.  Her download indicates that she use her machine 30 out of 30 days for compliance of100%.  She use her machine greater than 4 hours each night of 100 %.  On average she uses her machine 9 hours and 7 minutes. Her residual AHI is 1.4 on 10 cmH2O with EPR 3.  Her leak in the 95th percentile is 19 liters per minute.  She reports that the CPAP continues to work well for her.  She denies any issues.  She returns today for an evaluation.  REVIEW OF SYSTEMS: Out of a complete 14 system review of symptoms, the patient complains only of the following symptoms, and all other reviewed systems are negative.  ESS 3  ALLERGIES: Allergies  Allergen Reactions  . Morphine Other (See Comments)    Reaction with esophagus, unable to swallow.   . Ace Inhibitors Cough  . Aspirin Other (See Comments)    Reaction with esophagus, unable to swallow.   . Losartan Cough  . Tapentadol Other (See Comments)    Nausea, increased sleepiness, h/a  . Tomato Other (See Comments)    Acid reflux due to acid in tomato  . Adhesive [Tape] Rash    HOME MEDICATIONS: Outpatient Medications Prior to Visit  Medication Sig Dispense Refill  . albuterol (VENTOLIN HFA) 108 (90 Base) MCG/ACT inhaler Inhale 2 puffs into  the lungs every 6 (six) hours as needed for wheezing or shortness of breath. 8 g 6  . amLODipine (NORVASC) 10 MG tablet TAKE 1 TABLET BY MOUTH EVERY DAY 90 tablet 3  . B-D UF III MINI PEN NEEDLES 31G X 5 MM MISC USE ONCE DAILY AS DIRECTED 100 each 3  . betamethasone, augmented, (DIPROLENE) 0.05 % lotion     . blood glucose meter kit and supplies Dispense based on patient and insurance preference. Once daily testing dx. e11.9 1 each 0  . calcipotriene (DOVONOX) 0.005 % ointment Apply 1 application topically daily.     . cloNIDine (CATAPRES - DOSED IN MG/24 HR) 0.3 mg/24hr patch APPLY 1 PATCH TO SKIN ONCE A WEEK 12 patch 0  . cloNIDine (CATAPRES) 0.3 MG tablet TAKE 1 TABLET (0.3 MG TOTAL) BY MOUTH AT BEDTIME. 90 tablet 1  . cyclobenzaprine (FLEXERIL) 10 MG tablet TAKE 1 TABLET BY MOUTH THREE TIMES A DAY 90 tablet 1  . diazepam (VALIUM) 5 MG tablet Take one tablet by mouth three times daily for back spasm 90 tablet 5  . DULoxetine (CYMBALTA) 60 MG capsule TAKE 1 CAPSULE BY MOUTH TWICE A DAY (Patient taking differently: Take 60 mg by mouth 2 (two) times daily. ) 180 capsule 0  . ergocalciferol (VITAMIN D2) 1.25 MG (50000 UT) capsule Take 1  capsule (50,000 Units total) by mouth once a week. One capsule once weekly (Patient taking differently: Take 50,000 Units by mouth every Monday. ) 12 capsule 3  . esomeprazole (NEXIUM) 40 MG capsule TAKE 1 CAPSULE BY MOUTH EVERY DAY 90 capsule 0  . FLUoxetine (PROZAC) 20 MG capsule Take by mouth.    . fluticasone (FLONASE) 50 MCG/ACT nasal spray Place 2 sprays into both nostrils daily. 16 g 6  . furosemide (LASIX) 20 MG tablet TAKE 2 TABLETS BY MOUTH EVERY DAY 60 tablet 0  . gabapentin (NEURONTIN) 800 MG tablet TAKE 1 TABLET BY MOUTH FOUR TIMES A DAY 360 tablet 0  . glipiZIDE (GLUCOTROL XL) 10 MG 24 hr tablet TAKE 1 TABLET BY MOUTH EVERY DAY WITH BREAKFAST 90 tablet 0  . hydrOXYzine (ATARAX/VISTARIL) 10 MG tablet Take 10 mg by mouth in the morning, at noon, and at  bedtime.   2  . insulin aspart protamine- aspart (NOVOLOG MIX 70/30) (70-30) 100 UNIT/ML injection Inject 0.6 mLs (60 Units total) into the skin 2 (two) times daily with a meal. 20 mL 11  . ipratropium-albuterol (DUONEB) 0.5-2.5 (3) MG/3ML SOLN Take 3 mLs by nebulization every 6 (six) hours as needed. (Patient taking differently: Take 3 mLs by nebulization in the morning, at noon, and at bedtime. ) 360 mL 3  . KLOR-CON M20 20 MEQ tablet TAKE 1 TABLET BY MOUTH TWICE A DAY 180 tablet 1  . Lancets (ONETOUCH DELICA PLUS [id number]) MISC TEST ONCE DAILY 100 each 0  . levothyroxine (SYNTHROID) 50 MCG tablet TAKE 1 TABLET BY MOUTH DAILY BEFORE BREAKFAST (Patient taking differently: Take 50 mcg by mouth daily before breakfast. ) 90 tablet 1  . loratadine (CLARITIN) 10 MG tablet Take 1 tablet (10 mg total) by mouth daily. 30 tablet 11  . Magnesium Oxide 400 MG CAPS Take one (Patient taking differently: Take 400 mg by mouth at bedtime. Take one) 90 capsule 3  . meclizine (ANTIVERT) 25 MG tablet Take 25 mg by mouth in the morning, at noon, and at bedtime.     . metFORMIN (GLUCOPHAGE) 500 MG tablet TAKE 1 TABLET (500 MG TOTAL) BY MOUTH 2 (TWO) TIMES DAILY WITH A MEAL. 180 tablet 1  . metoprolol tartrate (LOPRESSOR) 25 MG tablet TAKE 1 TABLET BY MOUTH TWICE A DAY 180 tablet 3  . nitroGLYCERIN (NITROSTAT) 0.4 MG SL tablet Place 1 tablet (0.4 mg total) under the tongue every 5 (five) minutes as needed for chest pain. 30 tablet 0  . ondansetron (ZOFRAN) 4 MG tablet Take 1 tablet (4 mg total) by mouth every 8 (eight) hours as needed for nausea or vomiting. 20 tablet 0  . ONETOUCH VERIO test strip USE AS INSTRUCTED FOUR TIMES DAILY TESTING DX E11.65 100 strip 8  . oxyCODONE (OXYCONTIN) 40 mg 12 hr tablet Take 1 tablet (40 mg total) by mouth every 12 (twelve) hours. 60 tablet 0  . SYMBICORT 160-4.5 MCG/ACT inhaler TAKE 2 PUFFS BY MOUTH TWICE A DAY (Patient taking differently: Inhale 2 puffs into the lungs in the morning  and at bedtime. ) 30.6 Inhaler 3  . UNABLE TO FIND Glucerna 114 U/M or PRN Chux 90/M or PRN Incontinence pads 198/M or PRN  Tub Mat x 1  Grab bars 1 each 11  . UNABLE TO FIND Reacher/Grabber x 1  Lift chair x 1  Lifeline X1 1 each 0  . UNABLE TO FIND SHARPS CONTAINER  1/Y BED SUPPORT RAIL    1/Y 1 each 11  .  zolpidem (AMBIEN) 10 MG tablet Take 1 tablet (10 mg total) by mouth at bedtime. 30 tablet 5  . zolpidem (AMBIEN) 10 MG tablet Take 1 tablet (10 mg total) by mouth at bedtime. 30 tablet 5   No facility-administered medications prior to visit.    PAST MEDICAL HISTORY: Past Medical History:  Diagnosis Date  . Anxiety   . Arthritis   . Asthma   . Coronary atherosclerosis of native coronary artery    Mild nonobstructive CAD by cardiac catheterization 2008 - Dr. Terrence Dupont  . Degenerative disc disease   . Depression   . Dysrhythmia   . Essential hypertension, benign   . Folliculitis 2/44/0102  . GERD (gastroesophageal reflux disease)   . H/O hiatal hernia   . Helicobacter pylori gastritis 2008  . Hypercholesterolemia   . Hypertension   . Hyperthyroidism    s/p radiation  . Left-sided epistaxis 05/15/2018  . Low back pain   . Migraine   . Nephrolithiasis    Recurring episodes since 2004  . Severe obesity (BMI >= 40) (HCC)   . Sleep apnea    STOP BANG SCORE 6no cpap used  . Type 2 diabetes mellitus with diabetic neuropathy ([place])     PAST SURGICAL HISTORY: Past Surgical History:  Procedure Laterality Date  . ABDOMINAL HYSTERECTOMY    . BACK SURGERY  1993  . BACK SURGERY  1994  . BACK SURGERY  2002  . BACK SURGERY  2011   [place] Left   . CHOLECYSTECTOMY  1996  . COLONOSCOPY  2000 [facility]   INT HEMORRHOIDS/FISSURE  . COLONOSCOPY  2003 [facility], CHANGE IN BOWEL HABITS   INT HEMORRHOIDS  . COLONOSCOPY  2006 [facility]   INT HEMORRHOIDS  . COLONOSCOPY  2007 [facility]   INT HEMORRHOIDS  . CYSTOSCOPY WITH STENT PLACEMENT Left 07/25/2012    Procedure: CYSTOSCOPY, left retograde pyelogram WITH left ureteral  STENT PLACEMENT;  Surgeon: Claybon Jabs, MD;  Location: [facility];  Service: Urology;  Laterality: Left;  . CYSTOSCOPY/RETROGRADE/URETEROSCOPY  12/04/2011   Procedure: CYSTOSCOPY/RETROGRADE/URETEROSCOPY;  Surgeon: Molli Hazard, MD;  Location: [facility];  Service: Urology;  Laterality: Right;  . CYSTOSCOPY/RETROGRADE/URETEROSCOPY/STONE EXTRACTION WITH BASKET Left 09/03/2012   Procedure: CYSTOSCOPY/RETROGRADE pyelogram/digital URETEROSCOPY/STONE EXTRACTION WITH BASKET, left stent removal;  Surgeon: Molli Hazard, MD;  Location: [facility];  Service: Urology;  Laterality: Left;  . ESOPHAGOGASTRODUODENOSCOPY  2008   VOZ:DGUYQ hiatal hernia./Normal esophagus without evidence of Barrett's mass, stricture, erosion or ulceration./Normal duodenal bulb and second portion of the duodenum./Diffuse erythema in the body and the antrum with occasional erosion.  Biopsies obtained via cold forceps to evaluate for H. pylori gastritis  . FRACTURE SURGERY  1999   right clavicle  . HOLMIUM LASER APPLICATION Left 0/06/4740   Procedure: HOLMIUM LASER APPLICATION;  Surgeon: Molli Hazard, MD;  Location: [facility];  Service: Urology;  Laterality: Left;  . KNEE ARTHROSCOPY  10/04/2010,    right knee arthroscopy, dr Theda Sers  . LEFT HEART CATH AND CORONARY ANGIOGRAPHY N/A 05/08/2018   Procedure: LEFT HEART CATH AND CORONARY ANGIOGRAPHY;  Surgeon: Troy Sine, MD;  Location: [facility];  Service: Cardiovascular;  Laterality: N/A;  . left knee arthroscopic surgery  1999  . Left salphingectomy secondary to ectopic pregnancy  1991  . PARTIAL HYSTERECTOMY  1991  . RIGHT HEART CATH N/A 07/07/2019   Procedure: RIGHT HEART CATH;  Surgeon: Leonie Man, MD;  Location: [facility];  Service: Cardiovascular;  Laterality: N/A;  . rotary cuff  Right 05/14/2014   [facility]  . UPPER GASTROINTESTINAL ENDOSCOPY  2008 abd pain   H. pylori  gastritis  . UPPER GASTROINTESTINAL ENDOSCOPY  1996 [facility] [place]   [facility]    FAMILY HISTORY: Family History  Problem Relation Age of Onset  . Heart disease Mother   . Hypertension Mother   . Cancer Mother        Cervical   . Asthma Mother   . Heart attack Father   . Cancer Father        Prostate  . Colon cancer Father        DECEASED AGE 59  . Colon polyps Neg Hx     SOCIAL HISTORY: Social History   Socioeconomic History  . Marital status: Single    Spouse name: Not on file  . Number of children: 1  . Years of education: 44  . Highest education level: 12th grade  Occupational History  . Occupation: Disable   Tobacco Use  . Smoking status: Never Smoker  . Smokeless tobacco: Never Used  Vaping Use  . Vaping Use: Never used  Substance and Sexual Activity  . Alcohol use: No  . Drug use: No  . Sexual activity: Not Currently    Partners: Male    Birth control/protection: Surgical    Comment: hyst  Other Topics Concern  . Not on file  Social History Narrative   Lives alone    Social Determinants of Health   Financial Resource Strain:   . Difficulty of Paying Living Expenses: Not on file  Food Insecurity:   . Worried About Charity fundraiser in the Last Year: Not on file  . Ran Out of Food in the Last Year: Not on file  Transportation Needs:   . Lack of Transportation (Medical): Not on file  . Lack of Transportation (Non-Medical): Not on file  Physical Activity:   . Days of Exercise per Week: Not on file  . Minutes of Exercise per Session: Not on file  Stress:   . Feeling of Stress : Not on file  Social Connections:   . Frequency of Communication with Friends and Family: Not on file  . Frequency of Social Gatherings with Friends and Family: Not on file  . Attends Religious Services: Not on file  . Active Member of Clubs or Organizations: Not on file  . Attends Archivist Meetings: Not on file  . Marital Status: Not on file  Intimate Partner Violence:     . Fear of Current or Ex-Partner: Not on file  . Emotionally Abused: Not on file  . Physically Abused: Not on file  . Sexually Abused: Not on file      PHYSICAL EXAM  Vitals:   12/31/19 0937  BP: (!) 146/101  Pulse: (!) 101  Weight: (!) 317 lb 6.4 oz (144 kg)  Height: _0  (1.626 m)   Body mass index is 54.48 kg/m.  Generalized: Well developed, in no acute distress  Chest: Lungs clear to auscultation bilaterally  Neurological examination  Mentation: Alert oriented to time, place, history taking. Follows all commands speech and language fluent Cranial nerve II-XII: Extraocular movements were full, visual field were full on confrontational test Head turning and shoulder shrug  were normal and symmetric. Motor: The motor testing reveals 5 over 5 strength of all 4 extremities. Good symmetric motor tone is noted throughout.  Sensory: Sensory testing is intact to soft  touch on all 4 extremities. No evidence of extinction is noted.  Gait and station: Gait is normal.    DIAGNOSTIC DATA (LABS, IMAGING, TESTING) - I reviewed patient records, labs, notes, testing and imaging myself where available.  Lab Results  Component Value Date   WBC 6.7 07/03/2019   HGB 12.6 07/07/2019   HCT 37.0 07/07/2019   MCV 80.4 07/03/2019   PLT 389 07/03/2019      Component Value Date/Time   NA 138 11/10/2019 1303   K 4.3 11/10/2019 1303   CL 99 11/10/2019 1303   CO2 29 11/10/2019 1303   GLUCOSE 195 (H) 11/10/2019 1303   BUN 14 11/10/2019 1303   CREATININE 0.90 11/10/2019 1303   CALCIUM 9.3 11/10/2019 1303   PROT 7.8 11/10/2019 1303   ALBUMIN 4.0 06/05/2019 1019   AST 138 (H) 11/10/2019 1303   ALT 82 (H) 11/10/2019 1303   ALKPHOS 92 06/05/2019 1019   BILITOT 0.5 11/10/2019 1303   GFRNONAA 70 11/10/2019 1303   GFRAA 81 11/10/2019 1303   Lab Results  Component Value Date   CHOL 178 11/10/2019   HDL 44 (L) 11/10/2019   LDLCALC 97 11/10/2019   TRIG 244 (H) 11/10/2019   CHOLHDL 4.0  11/10/2019   Lab Results  Component Value Date   HGBA1C 11.4 (A) 11/20/2019   No results found for: VITAMINB12 Lab Results  Component Value Date   TSH 1.75 06/25/2019      ASSESSMENT AND PLAN 59 y.o. year old female  has a past medical history of Anxiety, Arthritis, Asthma, Coronary atherosclerosis of native coronary artery, Degenerative disc disease, Depression, Dysrhythmia, Essential hypertension, benign, Folliculitis (2/76/1848), GERD (gastroesophageal reflux disease), H/O hiatal hernia, Helicobacter pylori gastritis (2008), Hypercholesterolemia, Hypertension, Hyperthyroidism, Left-sided epistaxis (05/15/2018), Low back pain, Migraine, Nephrolithiasis, Severe obesity (BMI >= 40) ([place]), Sleep apnea, and Type 2 diabetes mellitus with diabetic neuropathy ([place]). here with:  1. OSA on CPAP  - CPAP compliance excellent - Good treatment of AHI  - Encourage patient to use CPAP nightly and > 4 hours each night - F/U in 1 year or sooner if needed   I spent 25 minutes of face-to-face and non-face-to-face time with patient.  This included previsit chart review, lab review, study review, order entry, electronic health record documentation, patient education.  Ward Givens, MSN, NP-C 12/31/2019, 9:36 AM [facility] [address] [place], [place] [address] [phone]

## 2020-01-01 ENCOUNTER — Other Ambulatory Visit: Payer: Self-pay

## 2020-01-01 ENCOUNTER — Ambulatory Visit (INDEPENDENT_AMBULATORY_CARE_PROVIDER_SITE_OTHER): Payer: Medicare Other | Admitting: Pulmonary Disease

## 2020-01-01 ENCOUNTER — Other Ambulatory Visit: Payer: Self-pay | Admitting: Family Medicine

## 2020-01-01 ENCOUNTER — Encounter: Payer: Self-pay | Admitting: Pulmonary Disease

## 2020-01-01 DIAGNOSIS — Z9989 Dependence on other enabling machines and devices: Secondary | ICD-10-CM

## 2020-01-01 DIAGNOSIS — J4530 Mild persistent asthma, uncomplicated: Secondary | ICD-10-CM

## 2020-01-01 DIAGNOSIS — I5032 Chronic diastolic (congestive) heart failure: Secondary | ICD-10-CM

## 2020-01-01 DIAGNOSIS — G4733 Obstructive sleep apnea (adult) (pediatric): Secondary | ICD-10-CM

## 2020-01-01 DIAGNOSIS — M62830 Muscle spasm of back: Secondary | ICD-10-CM

## 2020-01-01 MED ORDER — FUROSEMIDE 40 MG PO TABS
40.0000 mg | ORAL_TABLET | Freq: Every day | ORAL | 2 refills | Status: DC
Start: 2020-01-01 — End: 2020-05-03

## 2020-01-01 NOTE — Assessment & Plan Note (Signed)
Not convinced she has true asthma but given her significant dyspnea, will continue her on Symbicort for now. Albuterol only for rescue

## 2020-01-01 NOTE — Assessment & Plan Note (Signed)
Managed by neurology, DME is: [facility]. Very compliant and events are well controlled on CPAP 10 cm

## 2020-01-01 NOTE — Patient Instructions (Addendum)
Lasix 40 mg tabs daily # 30 x 2 refills Keep a weight chart every mon/wed/fri  Stay on symbicort  Albuterol for rescue

## 2020-01-01 NOTE — Progress Notes (Signed)
   Subjective:    [name], female    DOB: Nov 01, 1960, 59 y.o.   MRN: [id number]  HPI  59 year old morbidly obese never smoker for follow-up of "asthma" and chronic diastolic heart failure  She reports onset of intermittent wheezing and a dry cough since her cardiac cath in 05/2018. PMH -  difficult to control hypertension  on carvedilol, amlodipine and clonidine, chronic pain for which she takes oxycodone.  OSA on CPAP 10 cm with full facemask , followed by neurology  Asthma was diagnosed in 2014 spirometry  normal in 2015.  On her last visit, oxygen saturation was 91% and she was significantly short of breath.  We change Maxzide to Lasix.  She has lost 30 pounds from 325 to her current weight of 294 pounds and feels significantly improved No refill was provided and she has been off Lasix for 10 days. She is compliant with her CPAP, this is followed by neurology, compliance was checked and events are well controlled  Oxygen saturation 97% today She is compliant with Symbicort and very seldom needs albuterol  Significant tests/ events reviewed  07/2019-PFTs -ratio normal, FEV1 1.94/91%, significant bronchodilator response, improved to 105%, high normal DLCO  07/07/2019-right heart cath-no evidence of pulmonary hypertension, suspect noncardiac etiology for dyspnea although diastolic dysfunction component is also possible  Cardiac cath 05/2018 no significant CAD, LVEDP 14  Echo 08/8830 grade 2 diastolic dysfunction,normal LVEF  CT renal stone study 06/2017 >> bibasilar atelectasis, no ILD  2015 normal spirometry 01/2016 split-night study-AHI 22/hour, CPAP 10 cm Review of Systems neg for any significant sore throat, dysphagia, itching, sneezing, nasal congestion or excess/ purulent secretions, fever, chills, sweats, unintended wt loss, pleuritic or exertional cp, hempoptysis, orthopnea pnd or change in chronic leg swelling. Also denies presyncope, palpitations,  heartburn, abdominal pain, nausea, vomiting, diarrhea or change in bowel or urinary habits, dysuria,hematuria, rash, arthralgias, visual complaints, headache, numbness weakness or ataxia.     Objective:   Physical Exam  Gen. Pleasant, obese, in no distress ENT - no lesions, no post nasal drip Neck: No JVD, no thyromegaly, no carotid bruits Lungs: no use of accessory muscles, no dullness to percussion, decreased without rales or rhonchi  Cardiovascular: Rhythm regular, heart sounds  normal, no murmurs or gallops, no peripheral edema Musculoskeletal: No deformities, no cyanosis or clubbing , no tremors       Assessment & Plan:

## 2020-01-01 NOTE — Assessment & Plan Note (Signed)
Much improved with Lasix. Dry weight is 294 pounds.  I have asked her to keep her weight chart and take an extra dose of Lasix if weight increases by 5 pounds. We will refill Lasix 40 mg x3 She has follow-up with cardiology

## 2020-01-01 NOTE — Addendum Note (Signed)
Addended by: Merrilee Seashore on: 01/01/2020 12:20 PM   Modules accepted: Orders

## 2020-01-05 ENCOUNTER — Other Ambulatory Visit: Payer: Self-pay | Admitting: Family Medicine

## 2020-01-06 ENCOUNTER — Other Ambulatory Visit: Payer: Self-pay | Admitting: Family Medicine

## 2020-01-06 ENCOUNTER — Telehealth: Payer: Self-pay | Admitting: Podiatry

## 2020-01-06 DIAGNOSIS — Z5309 Procedure and treatment not carried out because of other contraindication: Secondary | ICD-10-CM

## 2020-01-06 MED ORDER — ZOLPIDEM TARTRATE 10 MG PO TABS
10.0000 mg | ORAL_TABLET | Freq: Every day | ORAL | 5 refills | Status: DC
Start: 2020-01-06 — End: 2020-06-06

## 2020-01-06 NOTE — Telephone Encounter (Signed)
Pt returned call and left message needing an appt for diabetic shoes  I returned call and left message for pt to call to schedule an appt for tomorrow afternoon or next Wednesday.

## 2020-01-07 ENCOUNTER — Ambulatory Visit: Payer: Medicare Other

## 2020-01-13 NOTE — Telephone Encounter (Signed)
Pt calling to follow up on this as she is out.

## 2020-01-14 ENCOUNTER — Ambulatory Visit: Payer: Medicare Other | Admitting: Orthotics

## 2020-01-14 ENCOUNTER — Other Ambulatory Visit: Payer: Self-pay

## 2020-01-14 DIAGNOSIS — M2011 Hallux valgus (acquired), right foot: Secondary | ICD-10-CM

## 2020-01-14 DIAGNOSIS — M2012 Hallux valgus (acquired), left foot: Secondary | ICD-10-CM

## 2020-01-14 DIAGNOSIS — E1142 Type 2 diabetes mellitus with diabetic polyneuropathy: Secondary | ICD-10-CM

## 2020-01-15 ENCOUNTER — Other Ambulatory Visit: Payer: Self-pay | Admitting: Family Medicine

## 2020-01-23 ENCOUNTER — Telehealth: Payer: Self-pay | Admitting: Podiatry

## 2020-01-23 ENCOUNTER — Ambulatory Visit: Payer: Medicare Other | Admitting: Orthotics

## 2020-01-23 NOTE — Telephone Encounter (Signed)
Received voicemail from pts daughter that pt is not feeling well and appt for today needs to be canceled. If someone could call to r/s.  Returned call and left message I did receive the message and to call me to r/s appt.

## 2020-01-29 ENCOUNTER — Other Ambulatory Visit: Payer: Self-pay | Admitting: Family Medicine

## 2020-01-30 ENCOUNTER — Ambulatory Visit: Payer: Medicare Other | Admitting: Orthotics

## 2020-01-31 ENCOUNTER — Other Ambulatory Visit: Payer: Self-pay | Admitting: Family Medicine

## 2020-01-31 MED ORDER — DIAZEPAM 5 MG PO TABS: ORAL_TABLET | ORAL | 3 refills | Status: DC

## 2020-02-02 ENCOUNTER — Ambulatory Visit: Payer: Medicare Other | Admitting: Nurse Practitioner

## 2020-02-04 ENCOUNTER — Other Ambulatory Visit: Payer: Self-pay

## 2020-02-04 ENCOUNTER — Encounter: Payer: Self-pay | Admitting: Podiatry

## 2020-02-04 ENCOUNTER — Ambulatory Visit (INDEPENDENT_AMBULATORY_CARE_PROVIDER_SITE_OTHER): Payer: Medicare Other | Admitting: Podiatry

## 2020-02-04 DIAGNOSIS — Q828 Other specified congenital malformations of skin: Secondary | ICD-10-CM | POA: Diagnosis not present

## 2020-02-04 DIAGNOSIS — L84 Corns and callosities: Secondary | ICD-10-CM | POA: Diagnosis not present

## 2020-02-04 DIAGNOSIS — M2142 Flat foot [pes planus] (acquired), left foot: Secondary | ICD-10-CM

## 2020-02-04 DIAGNOSIS — E1142 Type 2 diabetes mellitus with diabetic polyneuropathy: Secondary | ICD-10-CM | POA: Diagnosis not present

## 2020-02-04 DIAGNOSIS — M79675 Pain in left toe(s): Secondary | ICD-10-CM

## 2020-02-04 DIAGNOSIS — M79674 Pain in right toe(s): Secondary | ICD-10-CM

## 2020-02-04 DIAGNOSIS — B351 Tinea unguium: Secondary | ICD-10-CM

## 2020-02-04 DIAGNOSIS — E119 Type 2 diabetes mellitus without complications: Secondary | ICD-10-CM

## 2020-02-04 DIAGNOSIS — M2141 Flat foot [pes planus] (acquired), right foot: Secondary | ICD-10-CM

## 2020-02-05 ENCOUNTER — Other Ambulatory Visit: Payer: Self-pay

## 2020-02-05 ENCOUNTER — Telehealth: Payer: Self-pay

## 2020-02-05 ENCOUNTER — Other Ambulatory Visit: Payer: Self-pay | Admitting: Internal Medicine

## 2020-02-05 ENCOUNTER — Ambulatory Visit: Payer: Medicare Other | Admitting: Orthotics

## 2020-02-05 DIAGNOSIS — E1165 Type 2 diabetes mellitus with hyperglycemia: Secondary | ICD-10-CM

## 2020-02-05 DIAGNOSIS — R52 Pain, unspecified: Secondary | ICD-10-CM

## 2020-02-05 MED ORDER — NITROGLYCERIN 0.4 MG SL SUBL: 0.4000 mg | SUBLINGUAL_TABLET | SUBLINGUAL | 7 refills | Status: AC | PRN

## 2020-02-05 MED ORDER — INSULIN LISPRO PROT & LISPRO (75-25 MIX) 100 UNIT/ML KWIKPEN: 60.0000 [IU] | PEN_INJECTOR | Freq: Two times a day (BID) | SUBCUTANEOUS | 2 refills | Status: DC

## 2020-02-05 MED ORDER — INSULIN ASPART PROT & ASPART (70-30 MIX) 100 UNIT/ML ~~LOC~~ SUSP: 60.0000 [IU] | Freq: Two times a day (BID) | SUBCUTANEOUS | 11 refills | Status: DC

## 2020-02-05 NOTE — Telephone Encounter (Signed)
*  STAT* If patient is at the pharmacy, call can be transferred to refill team.   1. Which medications need to be refilled? (please list name of each medication and dose if known) nitroGLYCERIN (NITROSTAT) 0.4 MG SL tablet [[id number]]   2. Which pharmacy/location (including street and city if local pharmacy) is medication to be sent to?  [id number] - [place], [place] [address] [facility]  [place], [place] [address]  Phone:  [phone] Fax:  [phone]   3. Do they need a 30 day or 90 day supply? 30  Pt is completely out of meds

## 2020-02-05 NOTE — Telephone Encounter (Signed)
Rx sent again today. Refills were sent at last visit as well. Pt notified to let us know if the Pharmacy does not receive this.

## 2020-02-05 NOTE — Addendum Note (Signed)
Addended by: Lavell Luster A on: 02/05/2020 04:39 PM   Modules accepted: Orders

## 2020-02-05 NOTE — Telephone Encounter (Signed)
Refill for Nitro sent in to pharmacy.

## 2020-02-05 NOTE — Telephone Encounter (Signed)
[facility] [place] [facility] Aspart Protamine-Aspart 70/30 units 2 x per day - has not had Novolog Mix for last 3 days.  Please call in today and call patient to let her know this is done.  [facility] told [facility] do not [facility] the order and she needed to call the Doctor. Patient ask for follow up asap.

## 2020-02-08 NOTE — Progress Notes (Signed)
ANNUAL DIABETIC FOOT EXAM  Subjective: Angela [name] presents today for for annual diabetic foot examination, at risk foot care with history of diabetic neuropathy and painful callus(es) plantar aspect of both feet and painful thick toenails that are difficult to trim. Painful toenails interfere with ambulation. Aggravating factors include wearing enclosed shoe gear. Pain is relieved with periodic professional debridement. Painful calluses are aggravated when weightbearing with and without shoegear. Pain is relieved with periodic professional debridement.   Patient denies any h/o foot wounds.  Angela Helper, MD is patient's PCP. Last visit was 11/10/2019.  She is concerned her Oxycontin is not on her medication profile today  Past Medical History:  Diagnosis Date  . Anxiety   . Arthritis   . Asthma   . Coronary atherosclerosis of native coronary artery    Mild nonobstructive CAD by cardiac catheterization 2008 - Dr. Terrence Dupont  . Degenerative disc disease   . Depression   . Dysrhythmia   . Essential hypertension, benign   . Folliculitis 9/81/1914  . GERD (gastroesophageal reflux disease)   . H/O hiatal hernia   . Helicobacter pylori gastritis 2008  . Hypercholesterolemia   . Hypertension   . Hyperthyroidism    s/p radiation  . Left-sided epistaxis 05/15/2018  . Low back pain   . Migraine   . Nephrolithiasis    Recurring episodes since 2004  . Severe obesity (BMI >= 40) (HCC)   . Sleep apnea    STOP BANG SCORE 6no cpap used  . Type 2 diabetes mellitus with diabetic neuropathy [facility])     Patient Active Problem List   Diagnosis Date Noted  . GI bleeding 11/10/2019  . COPD (chronic obstructive pulmonary disease) ([place]) 09/11/2019  . HTN (hypertension) 09/11/2019  . Insomnia 09/11/2019  . Lumbar disc disease with radiculopathy 06/29/2019  . Urinary incontinence 06/29/2019  . Wheezing 04/08/2019  . Chronic diastolic heart failure ([place]) 04/08/2019  . Uncontrolled type 2  diabetes mellitus with hyperglycemia ([place]) 09/05/2018  . Hypothyroidism 09/05/2018  . Type 2 diabetes mellitus with hyperglycemia ([place]) 09/04/2018  . Chronic pain 05/06/2018  . Transaminitis 03/09/2018  . Contraindication to statin medication 03/09/2018  . Elevated TSH 10/14/2017  . Weakness of left hand 05/24/2017  . Carpal tunnel syndrome 05/11/2017  . Insomnia due to anxiety and fear 02/11/2017  . Encounter for pain management 11/09/2016  . Chronic wrist pain, left 11/06/2016  . Chronic left hip pain 11/06/2016  . Mass of right ovary 09/04/2016  . FH: colon cancer 09/01/2016  . Numbness of left hand 08/21/2016  . DDD (degenerative disc disease), lumbosacral 06/02/2016  . Allergic rhinitis 12/05/2015  . Upper airway cough syndrome 09/19/2015  . Back muscle spasm 09/14/2015  . Psoriasis 09/14/2015  . Knee pain, right 03/29/2015  . Cervical neck pain with evidence of disc disease 04/13/2014  . Coronary atherosclerosis of native coronary artery 05/26/2013  . Hemorrhoid 04/08/2012  . Vitamin D deficiency 04/04/2012  . Weakness of both legs 12/18/2011  . Nephrolithiasis 12/10/2011  . OSA on CPAP 12/05/2011  . GERD (gastroesophageal reflux disease) 08/16/2011  . Back pain 11/03/2010  . DOE (dyspnea on exertion) 11/03/2009  . Mixed hyperlipidemia 08/11/2008  . Morbid obesity ([place]) 01/06/2008  . Type 2 diabetes mellitus with vascular disease ([place]) 05/05/2006  . Essential hypertension, benign 05/05/2006  . Asthma 05/05/2006  . Helicobacter pylori gastritis 05/01/2006    Past Surgical History:  Procedure Laterality Date  . ABDOMINAL HYSTERECTOMY    . BACK SURGERY  1993  .  BACK SURGERY  1994  . BACK SURGERY  2002  . BACK SURGERY  2011   [facility]   . CARPAL TUNNEL RELEASE Left   . CHOLECYSTECTOMY  1996  . COLONOSCOPY  2000 [facility]   INT HEMORRHOIDS/FISSURE  . COLONOSCOPY  2003 [facility], CHANGE IN BOWEL HABITS   INT HEMORRHOIDS  . COLONOSCOPY  2006 [facility]   INT HEMORRHOIDS  .  COLONOSCOPY  2007 [facility]   INT HEMORRHOIDS  . CYSTOSCOPY WITH STENT PLACEMENT Left 07/25/2012   Procedure: CYSTOSCOPY, left retograde pyelogram WITH left ureteral  STENT PLACEMENT;  Surgeon: Garnett Farm, MD;  Location: [facility];  Service: Urology;  Laterality: Left;  . CYSTOSCOPY/RETROGRADE/URETEROSCOPY  12/04/2011   Procedure: CYSTOSCOPY/RETROGRADE/URETEROSCOPY;  Surgeon: Milford Cage, MD;  Location: [facility];  Service: Urology;  Laterality: Right;  . CYSTOSCOPY/RETROGRADE/URETEROSCOPY/STONE EXTRACTION WITH BASKET Left 09/03/2012   Procedure: CYSTOSCOPY/RETROGRADE pyelogram/digital URETEROSCOPY/STONE EXTRACTION WITH BASKET, left stent removal;  Surgeon: Milford Cage, MD;  Location: [facility];  Service: Urology;  Laterality: Left;  . ESOPHAGOGASTRODUODENOSCOPY  2008   [facility] hiatal hernia./Normal esophagus without evidence of Barrett's mass, stricture, erosion or ulceration./Normal duodenal bulb and second portion of the duodenum./Diffuse erythema in the body and the antrum with occasional erosion.  Biopsies obtained via cold forceps to evaluate for H. pylori gastritis  . FRACTURE SURGERY  1999   right clavicle  . HOLMIUM LASER APPLICATION Left 09/03/2012   Procedure: HOLMIUM LASER APPLICATION;  Surgeon: Milford Cage, MD;  Location: [facility];  Service: Urology;  Laterality: Left;  . KNEE ARTHROSCOPY  10/04/2010,    right knee arthroscopy, dr Thomasena Edis  . LEFT HEART CATH AND CORONARY ANGIOGRAPHY N/A 05/08/2018   Procedure: LEFT HEART CATH AND CORONARY ANGIOGRAPHY;  Surgeon: Lennette Bihari, MD;  Location: [facility] INVASIVE CV LAB;  Service: Cardiovascular;  Laterality: N/A;  . left knee arthroscopic surgery  1999  . Left salphingectomy secondary to ectopic pregnancy  1991  . PARTIAL HYSTERECTOMY  1991  . RIGHT HEART CATH N/A 07/07/2019   Procedure: RIGHT HEART CATH;  Surgeon: Marykay Lex, MD;  Location: [facility] INVASIVE CV LAB;  Service: Cardiovascular;  Laterality: N/A;  . rotary cuff   Right 05/14/2014   [facility]  . UPPER GASTROINTESTINAL ENDOSCOPY  2008 abd pain   H. pylori gastritis  . UPPER GASTROINTESTINAL ENDOSCOPY  1996 [facility] [place]   PUD    Current Outpatient Medications on File Prior to Visit  Medication Sig Dispense Refill  . albuterol (VENTOLIN HFA) 108 (90 Base) MCG/ACT inhaler Inhale 2 puffs into the lungs every 6 (six) hours as needed for wheezing or shortness of breath. 8 g 6  . amLODipine (NORVASC) 10 MG tablet TAKE 1 TABLET BY MOUTH EVERY DAY 90 tablet 3  . B-D UF III MINI PEN NEEDLES 31G X 5 MM MISC USE ONCE DAILY AS DIRECTED 100 each 3  . betamethasone, augmented, (DIPROLENE) 0.05 % lotion     . blood glucose meter kit and supplies Dispense based on patient and insurance preference. Once daily testing dx. e11.9 1 each 0  . calcipotriene (DOVONOX) 0.005 % ointment Apply 1 application topically daily.     . cloNIDine (CATAPRES - DOSED IN MG/24 HR) 0.3 mg/24hr patch APPLY 1 PATCH TO SKIN ONCE A WEEK 12 patch 0  . cloNIDine (CATAPRES) 0.3 MG tablet TAKE 1 TABLET (0.3 MG TOTAL) BY MOUTH AT BEDTIME. 90 tablet 1  . cyclobenzaprine (FLEXERIL) 10 MG tablet TAKE 1 TABLET BY MOUTH THREE TIMES  A DAY 90 tablet 1  . diazepam (VALIUM) 5 MG tablet Take one tablet by mouth three times daily for back spasm 90 tablet 5  . diazepam (VALIUM) 5 MG tablet Take one tablet by mouth three times daily for back spasm 90 tablet 3  . DULoxetine (CYMBALTA) 60 MG capsule TAKE 1 CAPSULE BY MOUTH TWICE A DAY (Patient taking differently: Take 60 mg by mouth 2 (two) times daily. ) 180 capsule 0  . ergocalciferol (VITAMIN D2) 1.25 MG (50000 UT) capsule Take 1 capsule (50,000 Units total) by mouth once a week. One capsule once weekly (Patient taking differently: Take 50,000 Units by mouth every Monday. ) 12 capsule 3  . esomeprazole (NEXIUM) 40 MG capsule TAKE 1 CAPSULE BY MOUTH EVERY DAY 90 capsule 0  . FLUoxetine (PROZAC) 20 MG capsule Take by mouth.    . fluticasone (FLONASE) 50 MCG/ACT  nasal spray Place 2 sprays into both nostrils daily. 16 g 6  . furosemide (LASIX) 40 MG tablet Take 1 tablet (40 mg total) by mouth daily. 30 tablet 2  . gabapentin (NEURONTIN) 800 MG tablet TAKE 1 TABLET BY MOUTH FOUR TIMES A DAY 360 tablet 0  . glipiZIDE (GLUCOTROL XL) 10 MG 24 hr tablet TAKE 1 TABLET BY MOUTH EVERY DAY WITH BREAKFAST 90 tablet 0  . hydrOXYzine (ATARAX/VISTARIL) 10 MG tablet Take 10 mg by mouth in the morning, at noon, and at bedtime.   2  . ipratropium-albuterol (DUONEB) 0.5-2.5 (3) MG/3ML SOLN Take 3 mLs by nebulization every 6 (six) hours as needed. (Patient taking differently: Take 3 mLs by nebulization in the morning, at noon, and at bedtime. ) 360 mL 3  . KLOR-CON M20 20 MEQ tablet TAKE 1 TABLET BY MOUTH TWICE A DAY 180 tablet 1  . Lancets (ONETOUCH DELICA PLUS LANCET33G) MISC TEST ONCE DAILY 100 each 0  . levothyroxine (SYNTHROID) 50 MCG tablet TAKE 1 TABLET BY MOUTH DAILY BEFORE BREAKFAST (Patient taking differently: Take 50 mcg by mouth daily before breakfast. ) 90 tablet 1  . loratadine (CLARITIN) 10 MG tablet Take 1 tablet (10 mg total) by mouth daily. 30 tablet 11  . Magnesium Oxide 400 MG CAPS Take one (Patient taking differently: Take 400 mg by mouth at bedtime. Take one) 90 capsule 3  . meclizine (ANTIVERT) 25 MG tablet Take 25 mg by mouth in the morning, at noon, and at bedtime.     . metFORMIN (GLUCOPHAGE) 500 MG tablet TAKE 1 TABLET (500 MG TOTAL) BY MOUTH 2 (TWO) TIMES DAILY WITH A MEAL. 180 tablet 1  . metoprolol tartrate (LOPRESSOR) 25 MG tablet TAKE 1 TABLET BY MOUTH TWICE A DAY 180 tablet 3  . ondansetron (ZOFRAN) 4 MG tablet Take 1 tablet (4 mg total) by mouth every 8 (eight) hours as needed for nausea or vomiting. 20 tablet 0  . ONETOUCH VERIO test strip USE AS INSTRUCTED FOUR TIMES DAILY TESTING DX E11.65 100 strip 8  . oxyCODONE (OXYCONTIN) 40 mg 12 hr tablet Take 40 mg by mouth every 12 (twelve) hours.    . SYMBICORT 160-4.5 MCG/ACT inhaler TAKE 2 PUFFS  BY MOUTH TWICE A DAY (Patient taking differently: Inhale 2 puffs into the lungs in the morning and at bedtime. ) 30.6 Inhaler 3  . triamterene-hydrochlorothiazide (MAXZIDE) 75-50 MG tablet Take 1 tablet by mouth daily.    Marland Kitchen UNABLE TO FIND Glucerna 114 U/M or PRN Chux 90/M or PRN Incontinence pads 198/M or PRN  Tub Mat x 1  Grab  bars 1 each 11  . UNABLE TO FIND Reacher/Grabber x 1  Lift chair x 1  Lifeline X1 1 each 0  . UNABLE TO FIND SHARPS CONTAINER  1/Y BED SUPPORT RAIL    1/Y 1 each 11  . zolpidem (AMBIEN) 10 MG tablet Take 1 tablet (10 mg total) by mouth at bedtime. 30 tablet 5   No current facility-administered medications on file prior to visit.     Allergies  Allergen Reactions  . Morphine Other (See Comments)    Reaction with esophagus, unable to swallow.   . Ace Inhibitors Cough  . Aspirin Other (See Comments)    Reaction with esophagus, unable to swallow.   . Losartan Cough  . Tapentadol Other (See Comments)    Nausea, increased sleepiness, h/a  . Tomato Other (See Comments)    Acid reflux due to acid in tomato  . Adhesive [Tape] Rash    Social History   Occupational History  . Occupation: Disable   Tobacco Use  . Smoking status: Never Smoker  . Smokeless tobacco: Never Used  Vaping Use  . Vaping Use: Never used  Substance and Sexual Activity  . Alcohol use: No  . Drug use: No  . Sexual activity: Not Currently    Partners: Male    Birth control/protection: Surgical    Comment: hyst    Family History  Problem Relation Age of Onset  . Heart disease Mother   . Hypertension Mother   . Cancer Mother        Cervical   . Asthma Mother   . Heart attack Father   . Cancer Father        Prostate  . Colon cancer Father        DECEASED AGE 61  . Colon polyps Neg Hx     Immunization History  Administered Date(s) Administered  . Influenza Split 03/30/2011  . Influenza Whole 03/21/2005, 02/09/2006  . Influenza,inj,Quad PF,6+ Mos 02/11/2014, 07/27/2015,  03/09/2016, 01/29/2017, 02/07/2018, 01/30/2019  . Influenza-Unspecified 01/29/2017  . PFIZER SARS-COV-2 Vaccination 07/22/2019, 08/12/2019  . Pneumococcal Polysaccharide-23 04/17/2006, 09/14/2015  . Td 04/17/2006    Objective: There were no vitals filed for this visit.  Angela [name] is a pleasant 59 y.o. [place] female, morbidly obese,  in NAD. AAO X 3.  Vascular Examination: Capillary refill time to digits immediate b/l. Palpable DP pulse(s) b/l lower extremities Palpable PT pulse(s) b/l lower extremities Pedal hair sparse. Lower extremity skin temperature gradient within normal limits. No pain with calf compression b/l. No edema noted b/l lower extremities.  Dermatological Examination: Pedal skin with normal turgor, texture and tone bilaterally. No open wounds bilaterally. No interdigital macerations bilaterally. Toenails 1-5 b/l elongated, discolored, dystrophic, thickened, crumbly with subungual debris and tenderness to dorsal palpation. Porokeratotic lesion(s) submet head 5 left foot, left foot, right foot and posterior aspect of heel. No erythema, no edema, no drainage, no flocculence.  Musculoskeletal Examination: Normal muscle strength 5/5 to all lower extremity muscle groups bilaterally. No pain crepitus or joint limitation noted with ROM b/l. Hallux valgus with bunion deformity noted b/l lower extremities. Utilizes cane for ambulation assistance. Palpable osteophytes noted dorsal aspect of midfoot b/l.  Footwear Assessment: Does the patient wear appropriate shoes? Yes. Does the patient need inserts/orthotics? Yes, she has diabetic shoes/insoles.  Neurological Examination: Pt has subjective symptoms of neuropathy. Protective sensation decreased with 10 gram monofilament b/l. Vibratory sensation intact b/l. Clonus negative b/l.  Hemoglobin A1C Latest Ref Rng & Units 11/20/2019 07/16/2019  04/02/2019  HGBA1C 4.0 - 5.6 % 11.4(A) 11.8(A) 13.0(A)  Some recent data might be  hidden   Assessment: 1. Pain due to onychomycosis of toenails of both feet   2. Porokeratosis   3. Callus   4. Pes planus of both feet   5. Diabetic peripheral neuropathy associated with type 2 diabetes mellitus ([place])   6. Encounter for diabetic foot exam ([place])    ADA Risk Categorization:  Low Risk :  Patient has all of the following: Intact protective sensation No prior foot ulcer  No severe deformity Pedal pulses present  Plan: -As requested by Ms. Porreca, we updated her medication profile to include Oxycontn. -Examined patient. -Diabetic foot examination performed on today's visit. -Toenails 1-5 b/l were debrided in length and girth with sterile nail nippers and dremel without iatrogenic bleeding.  -Callus(es) left foot and right foot pared utilizing sterile scalpel blade without complication or incident. Total number debrided =2. -Painful porokeratotic lesion(s) submet head 5 left foot and posterior aspect of heel pared and enucleated with sterile scalpel blade without incident. Total number of lesions debrided=2. Total 4 lesions debrided today. -Patient to report any pedal injuries to medical professional immediately. -Patient to continue soft, supportive shoe gear daily. -Patient/POA to call should there be question/concern in the interim.  Return in about 3 months (around 05/06/2020).  Angela [name], DPM

## 2020-02-11 ENCOUNTER — Encounter: Payer: Self-pay | Admitting: Family Medicine

## 2020-02-11 ENCOUNTER — Ambulatory Visit (INDEPENDENT_AMBULATORY_CARE_PROVIDER_SITE_OTHER): Payer: Medicare Other | Admitting: Family Medicine

## 2020-02-11 ENCOUNTER — Other Ambulatory Visit: Payer: Self-pay

## 2020-02-11 ENCOUNTER — Other Ambulatory Visit: Payer: Self-pay | Admitting: Family Medicine

## 2020-02-11 VITALS — BP 140/78 | HR 70 | Temp 98.0°F | Resp 18 | Ht 64.0 in | Wt 302.0 lb

## 2020-02-11 DIAGNOSIS — E1165 Type 2 diabetes mellitus with hyperglycemia: Secondary | ICD-10-CM

## 2020-02-11 DIAGNOSIS — E038 Other specified hypothyroidism: Secondary | ICD-10-CM

## 2020-02-11 DIAGNOSIS — Z23 Encounter for immunization: Secondary | ICD-10-CM | POA: Diagnosis not present

## 2020-02-11 DIAGNOSIS — I10 Essential (primary) hypertension: Secondary | ICD-10-CM

## 2020-02-11 DIAGNOSIS — R52 Pain, unspecified: Secondary | ICD-10-CM

## 2020-02-11 DIAGNOSIS — R7989 Other specified abnormal findings of blood chemistry: Secondary | ICD-10-CM

## 2020-02-11 DIAGNOSIS — I5032 Chronic diastolic (congestive) heart failure: Secondary | ICD-10-CM

## 2020-02-11 DIAGNOSIS — M5440 Lumbago with sciatica, unspecified side: Secondary | ICD-10-CM

## 2020-02-11 DIAGNOSIS — E1159 Type 2 diabetes mellitus with other circulatory complications: Secondary | ICD-10-CM

## 2020-02-11 DIAGNOSIS — E782 Mixed hyperlipidemia: Secondary | ICD-10-CM

## 2020-02-11 DIAGNOSIS — M62830 Muscle spasm of back: Secondary | ICD-10-CM

## 2020-02-11 DIAGNOSIS — Z01419 Encounter for gynecological examination (general) (routine) without abnormal findings: Secondary | ICD-10-CM

## 2020-02-11 MED ORDER — OXYCODONE HCL ER 40 MG PO T12A
40.0000 mg | EXTENDED_RELEASE_TABLET | Freq: Two times a day (BID) | ORAL | 0 refills | Status: AC
Start: 2020-04-12 — End: 2020-05-13

## 2020-02-11 MED ORDER — OXYCODONE HCL ER 40 MG PO T12A
40.0000 mg | EXTENDED_RELEASE_TABLET | Freq: Two times a day (BID) | ORAL | 0 refills | Status: AC
Start: 2020-02-11 — End: 2020-03-13

## 2020-02-11 MED ORDER — OXYCODONE HCL ER 40 MG PO T12A
40.0000 mg | EXTENDED_RELEASE_TABLET | Freq: Two times a day (BID) | ORAL | 0 refills | Status: AC
Start: 2020-03-13 — End: 2020-04-12

## 2020-02-11 MED ORDER — KETOROLAC TROMETHAMINE 60 MG/2ML IM SOLN: 60.0000 mg | Freq: Once | INTRAMUSCULAR | Status: AC

## 2020-02-11 NOTE — Progress Notes (Signed)
Angela [name]     MRN: [id number]      DOB: 01/05/1961   HPI Angela [name] is here for follow up and re-evaluation of chronic medical conditions, medication management and review of any available recent lab and radiology data.  Preventive health is updated, specifically  Cancer screening and Immunization.   Questions or concerns regarding consultations or procedures which the PT has had in the interim are  addressed. The PT denies any adverse reactions to current medications since the last visit.  Low back pain to right thigh lateral x 1 month, request injetion, had been out of muscle relaxant Blood sugar still uncontrolled but working on this. Changing diet with weight loss  ROS Denies recent fever or chills. Denies sinus pressure, nasal congestion, ear pain or sore throat. Denies chest congestion, productive cough or wheezing. Denies chest pains, palpitations and leg swelling Denies abdominal pain, nausea, vomiting,diarrhea or constipation.   Denies dysuria, frequency, hesitancy or incontinence.  Denies headaches, seizures, numbness, or tingling. Denies uncontrolled depression, anxiety or insomnia. Denies skin break down or rash.   PE  BP 140/78   Pulse 70   Temp 98 F (36.7 C)   Resp 18   Ht 5\' 4"  (1.626 m)   Wt (!) 302 lb (137 kg)   SpO2 92%   BMI 51.84 kg/m    Patient alert and oriented and in no cardiopulmonary distress.Pt in pain  HEENT: No facial asymmetry, EOMI,     Neck supple .  Chest: Clear to auscultation bilaterally.  CVS: S1, S2 no murmurs, no S3.Regular rate.  ABD: Soft non tender.   Ext: No edema  MS: markedly decreased  ROM lumbar spine, , hips and knees.  Skin: Intact, no ulcerations or rash noted.  Psych: Good eye contact, normal affect. Memory intact not anxious or depressed appearing.  CNS: CN 2-12 intact, power,  normal throughout.no focal deficits noted.   Assessment & Plan  Essential hypertension, benign Not at goal, no med  change DASH diet and commitment to daily physical activity for a minimum of 30 minutes discussed and encouraged, as a part of hypertension management. The importance of attaining a healthy weight is also discussed.  BP/Weight 02/11/2020 01/01/2020 12/31/2019 12/04/2019 11/20/2019 11/10/2019 8/93/8101  Systolic BP 751 025 852 778 242 353 614  Diastolic BP 78 90 431 74 80 83 91  Wt. (Lbs) 302 294 317.4 318.4 316.6 324.4 323  BMI 51.84 50.46 54.48 54.65 54.34 55.68 55.44       Type 2 diabetes mellitus with vascular disease ([place]) Managed by Endo and uncontrolled Angela [name] is reminded of the importance of commitment to daily physical activity for 30 minutes or more, as able and the need to limit carbohydrate intake to 30 to 60 grams per meal to help with blood sugar control.   The need to take medication as prescribed, test blood sugar as directed, and to call between visits if there is a concern that blood sugar is uncontrolled is also discussed.   Angela [name] is reminded of the importance of daily foot exam, annual eye examination, and good blood sugar, blood pressure and cholesterol control.  Diabetic Labs Latest Ref Rng & Units 02/11/2020 11/20/2019 11/10/2019 07/16/2019 07/03/2019  HbA1c 4.0 - 5.6 % - 11.4(A) - 11.8(A) -  Microalbumin Not Estab. ug/mL - - 101.9(H) - -  Micro/Creat Ratio <30 mcg/mg creat - - - - -  Chol <200 mg/dL - - 178 - -  HDL > OR =  50 mg/dL - - 44(L) - -  Calc LDL mg/dL (calc) - - 97 - -  Triglycerides <150 mg/dL - - 244(H) - -  Creatinine 0.50 - 1.05 mg/dL 0.91 - 0.90 - 1.15(H)  GFR >60.00 mL/min - - - - -   BP/Weight 02/11/2020 01/01/2020 12/31/2019 12/04/2019 11/20/2019 11/10/2019 6/96/7893  Systolic BP 810 175 102 585 277 824 235  Diastolic BP 78 90 361 74 80 83 91  Wt. (Lbs) 302 294 317.4 318.4 316.6 324.4 323  BMI 51.84 50.46 54.48 54.65 54.34 55.68 55.44   Foot/eye exam completion dates Latest Ref Rng & Units 02/11/2020 12/25/2019  Eye Exam No Retinopathy - No Retinopathy   Foot Form Completion - Done -        Morbid obesity ([name])  Patient re-educated about  the importance of commitment to a  minimum of 150 minutes of exercise per week as able.  The importance of healthy food choices with portion control discussed, as well as eating regularly and within a 12 hour window most days. The need to choose "clean , green" food 50 to 75% of the time is discussed, as well as to make water the primary drink and set a goal of 64 ounces water daily.    Weight /BMI 02/11/2020 01/01/2020 12/31/2019  WEIGHT 302 lb 294 lb 317 lb 6.4 oz  HEIGHT 5\' 4"  5\' 4"  5\' 4"   BMI 51.84 kg/m2 50.46 kg/m2 54.48 kg/m2      Chronic diastolic heart failure (HCC) Stable , no s/s of decompensation currently  Mixed hyperlipidemia Hyperlipidemia:Low fat diet discussed and encouraged.   Lipid Panel  Lab Results  Component Value Date   CHOL 178 11/10/2019   HDL 44 (L) 11/10/2019   LDLCALC 97 11/10/2019   TRIG 244 (H) 11/10/2019   CHOLHDL 4.0 11/10/2019     Needs to reduce fat in diet and increase exercise  Acute low back pain with sciatica toradol 60 mg IM in office  Uncontrolled type 2 diabetes mellitus with hyperglycemia ([place]) Managed by Endo, slow progress Angela [name] is reminded of the importance of commitment to daily physical activity for 30 minutes or more, as able and the need to limit carbohydrate intake to 30 to 60 grams per meal to help with blood sugar control.   The need to take medication as prescribed, test blood sugar as directed, and to call between visits if there is a concern that blood sugar is uncontrolled is also discussed.   Angela [name] is reminded of the importance of daily foot exam, annual eye examination, and good blood sugar, blood pressure and cholesterol control.  Diabetic Labs Latest Ref Rng & Units 02/11/2020 11/20/2019 11/10/2019 07/16/2019 07/03/2019  HbA1c 4.0 - 5.6 % - 11.4(A) - 11.8(A) -  Microalbumin Not Estab. ug/mL - - 101.9(H) - -   Micro/Creat Ratio <30 mcg/mg creat - - - - -  Chol <200 mg/dL - - 178 - -  HDL > OR = 50 mg/dL - - 44(L) - -  Calc LDL mg/dL (calc) - - 97 - -  Triglycerides <150 mg/dL - - 244(H) - -  Creatinine 0.50 - 1.05 mg/dL 0.91 - 0.90 - 1.15(H)  GFR >60.00 mL/min - - - - -   BP/Weight 02/11/2020 01/01/2020 12/31/2019 12/04/2019 11/20/2019 11/10/2019 4/43/1540  Systolic BP 086 761 950 932 671 245 809  Diastolic BP 78 90 983 74 80 83 91  Wt. (Lbs) 302 294 317.4 318.4 316.6 324.4 323  BMI 51.84 50.46 54.48  54.65 54.34 55.68 55.44   Foot/eye exam completion dates Latest Ref Rng & Units 02/11/2020 12/25/2019  Eye Exam No Retinopathy - No Retinopathy  Foot Form Completion - Done -        Hypothyroidism Slightly undercorrected, med adjustment made  Encounter for pain management The patient's Controlled Substance registry is reviewed and compliance confirmed. Adequacy of  Pain control and level of function is assessed. Medication dosing is adjusted as deemed appropriate. Twelve weeks of medication is prescribed , with a follow up appointment between 11 to 12 weeks .   Back muscle spasm Uncontrolled , recently off medication , same renewed

## 2020-02-11 NOTE — Patient Instructions (Signed)
Follow-up in office with MD first week in January call if you need me sooner.  Flu vaccine in office today.  Toradol 60 mg IM in office for back pain with right-sided sciatica.  Please get CMP and EGFR and TSH today.  You are referred for gyne exam   Congratulations on weight loss continue to work on this as well as normalizing your blood sugar.  Foot exam today shows nerve damage please ensure that you continue to examine your feet daily to ensure you have no clot or sore.  Think about what you will eat, plan ahead. Choose " clean, green, fresh or frozen" over canned, processed or packaged foods which are more sugary, salty and fatty. 70 to 75% of food eaten should be vegetables and fruit. Three meals at set times with snacks allowed between meals, but they must be fruit or vegetables. Aim to eat over a 12 hour period , example 7 am to 7 pm, and STOP after  your last meal of the day. Drink water,generally about 64 ounces per day, no other drink is as healthy. Fruit juice is best enjoyed in a healthy way, by EATING the fruit.

## 2020-02-12 ENCOUNTER — Other Ambulatory Visit: Payer: Self-pay | Admitting: Family Medicine

## 2020-02-12 LAB — COMPLETE METABOLIC PANEL WITH GFR
AG Ratio: 1.3 (calc) (ref 1.0–2.5)
ALT: 80 U/L — ABNORMAL HIGH (ref 6–29)
AST: 72 U/L — ABNORMAL HIGH (ref 10–35)
Albumin: 4.3 g/dL (ref 3.6–5.1)
Alkaline phosphatase (APISO): 85 U/L (ref 37–153)
BUN: 18 mg/dL (ref 7–25)
CO2: 30 mmol/L (ref 20–32)
Calcium: 9.5 mg/dL (ref 8.6–10.4)
Chloride: 95 mmol/L — ABNORMAL LOW (ref 98–110)
Creat: 0.91 mg/dL (ref 0.50–1.05)
GFR, Est African American: 80 mL/min/{1.73_m2} (ref >=60)
GFR, Est Non African American: 69 mL/min/{1.73_m2} (ref >=60)
Globulin: 3.2 g/dL (calc) (ref 1.9–3.7)
Glucose, Bld: 316 mg/dL — ABNORMAL HIGH (ref 65–139)
Potassium: 4.6 mmol/L (ref 3.5–5.3)
Sodium: 136 mmol/L (ref 135–146)
Total Bilirubin: 0.6 mg/dL (ref 0.2–1.2)
Total Protein: 7.5 g/dL (ref 6.1–8.1)

## 2020-02-12 LAB — TSH: TSH: 6.31 mIU/L — ABNORMAL HIGH (ref 0.40–4.50)

## 2020-02-12 MED ORDER — LEVOTHYROXINE SODIUM 75 MCG PO TABS: 75.0000 ug | ORAL_TABLET | Freq: Every day | ORAL | 1 refills | Status: DC

## 2020-02-12 NOTE — Progress Notes (Signed)
Synthroid 75

## 2020-02-15 ENCOUNTER — Encounter: Payer: Self-pay | Admitting: Family Medicine

## 2020-02-15 NOTE — Assessment & Plan Note (Signed)
Uncontrolled , recently off medication , same renewed

## 2020-02-15 NOTE — Assessment & Plan Note (Signed)
Hyperlipidemia:Low fat diet discussed and encouraged.   Lipid Panel  Lab Results  Component Value Date   CHOL 178 11/10/2019   HDL 44 (L) 11/10/2019   LDLCALC 97 11/10/2019   TRIG 244 (H) 11/10/2019   CHOLHDL 4.0 11/10/2019     Needs to reduce fat in diet and increase exercise

## 2020-02-15 NOTE — Assessment & Plan Note (Signed)
Managed by Endo and uncontrolled Angela [name] is reminded of the importance of commitment to daily physical activity for 30 minutes or more, as able and the need to limit carbohydrate intake to 30 to 60 grams per meal to help with blood sugar control.   The need to take medication as prescribed, test blood sugar as directed, and to call between visits if there is a concern that blood sugar is uncontrolled is also discussed.   Angela [name] is reminded of the importance of daily foot exam, annual eye examination, and good blood sugar, blood pressure and cholesterol control.  Diabetic Labs Latest Ref Rng & Units 02/11/2020 11/20/2019 11/10/2019 07/16/2019 07/03/2019  HbA1c 4.0 - 5.6 % - 11.4(A) - 11.8(A) -  Microalbumin Not Estab. ug/mL - - 101.9(H) - -  Micro/Creat Ratio <30 mcg/mg creat - - - - -  Chol <200 mg/dL - - 178 - -  HDL > OR = 50 mg/dL - - 44(L) - -  Calc LDL mg/dL (calc) - - 97 - -  Triglycerides <150 mg/dL - - 244(H) - -  Creatinine 0.50 - 1.05 mg/dL 0.91 - 0.90 - 1.15(H)  GFR >60.00 mL/min - - - - -   BP/Weight 02/11/2020 01/01/2020 12/31/2019 12/04/2019 11/20/2019 11/10/2019 3/38/3291  Systolic BP 916 606 004 599 774 142 395  Diastolic BP 78 90 320 74 80 83 91  Wt. (Lbs) 302 294 317.4 318.4 316.6 324.4 323  BMI 51.84 50.46 54.48 54.65 54.34 55.68 55.44   Foot/eye exam completion dates Latest Ref Rng & Units 02/11/2020 12/25/2019  Eye Exam No Retinopathy - No Retinopathy  Foot Form Completion - Done -

## 2020-02-15 NOTE — Assessment & Plan Note (Signed)
Stable, no s/s of decompensation currently 

## 2020-02-15 NOTE — Assessment & Plan Note (Signed)
Slightly undercorrected, med adjustment made

## 2020-02-15 NOTE — Assessment & Plan Note (Signed)
Not at goal, no med change DASH diet and commitment to daily physical activity for a minimum of 30 minutes discussed and encouraged, as a part of hypertension management. The importance of attaining a healthy weight is also discussed.  BP/Weight 02/11/2020 01/01/2020 12/31/2019 12/04/2019 11/20/2019 11/10/2019 6/80/3212  Systolic BP 248 250 037 048 889 169 450  Diastolic BP 78 90 388 74 80 83 91  Wt. (Lbs) 302 294 317.4 318.4 316.6 324.4 323  BMI 51.84 50.46 54.48 54.65 54.34 55.68 55.44

## 2020-02-15 NOTE — Assessment & Plan Note (Signed)
  Patient re-educated about  the importance of commitment to a  minimum of 150 minutes of exercise per week as able.  The importance of healthy food choices with portion control discussed, as well as eating regularly and within a 12 hour window most days. The need to choose "clean , green" food 50 to 75% of the time is discussed, as well as to make water the primary drink and set a goal of 64 ounces water daily.    Weight /BMI 02/11/2020 01/01/2020 12/31/2019  WEIGHT 302 lb 294 lb 317 lb 6.4 oz  HEIGHT 5\' 4"  5\' 4"  5\' 4"   BMI 51.84 kg/m2 50.46 kg/m2 54.48 kg/m2

## 2020-02-15 NOTE — Assessment & Plan Note (Signed)
toradol 60 mg IM in office  ?

## 2020-02-15 NOTE — Assessment & Plan Note (Signed)
Managed by Endo, slow progress Angela [name] is reminded of the importance of commitment to daily physical activity for 30 minutes or more, as able and the need to limit carbohydrate intake to 30 to 60 grams per meal to help with blood sugar control.   The need to take medication as prescribed, test blood sugar as directed, and to call between visits if there is a concern that blood sugar is uncontrolled is also discussed.   Angela [name] is reminded of the importance of daily foot exam, annual eye examination, and good blood sugar, blood pressure and cholesterol control.  Diabetic Labs Latest Ref Rng & Units 02/11/2020 11/20/2019 11/10/2019 07/16/2019 07/03/2019  HbA1c 4.0 - 5.6 % - 11.4(A) - 11.8(A) -  Microalbumin Not Estab. ug/mL - - 101.9(H) - -  Micro/Creat Ratio <30 mcg/mg creat - - - - -  Chol <200 mg/dL - - 178 - -  HDL > OR = 50 mg/dL - - 44(L) - -  Calc LDL mg/dL (calc) - - 97 - -  Triglycerides <150 mg/dL - - 244(H) - -  Creatinine 0.50 - 1.05 mg/dL 0.91 - 0.90 - 1.15(H)  GFR >60.00 mL/min - - - - -   BP/Weight 02/11/2020 01/01/2020 12/31/2019 12/04/2019 11/20/2019 11/10/2019 3/56/7014  Systolic BP 103 013 143 888 757 972 820  Diastolic BP 78 90 601 74 80 83 91  Wt. (Lbs) 302 294 317.4 318.4 316.6 324.4 323  BMI 51.84 50.46 54.48 54.65 54.34 55.68 55.44   Foot/eye exam completion dates Latest Ref Rng & Units 02/11/2020 12/25/2019  Eye Exam No Retinopathy - No Retinopathy  Foot Form Completion - Done -

## 2020-02-15 NOTE — Assessment & Plan Note (Signed)
The patient's Controlled Substance registry is reviewed and compliance confirmed. Adequacy of  Pain control and level of function is assessed. Medication dosing is adjusted as deemed appropriate. Twelve weeks of medication is prescribed , with a follow up appointment between 11 to 12 weeks .  

## 2020-02-19 ENCOUNTER — Ambulatory Visit: Payer: Medicare Other | Admitting: Orthotics

## 2020-02-19 ENCOUNTER — Other Ambulatory Visit: Payer: Self-pay

## 2020-02-19 DIAGNOSIS — Q828 Other specified congenital malformations of skin: Secondary | ICD-10-CM

## 2020-02-19 NOTE — Progress Notes (Signed)
Shoes too short, switching to full size bigger.

## 2020-02-26 ENCOUNTER — Other Ambulatory Visit: Payer: Self-pay

## 2020-02-26 MED ORDER — UNABLE TO FIND: 99 refills | Status: DC

## 2020-03-01 ENCOUNTER — Ambulatory Visit: Payer: Medicare Other | Admitting: Nurse Practitioner

## 2020-03-02 ENCOUNTER — Other Ambulatory Visit: Payer: Self-pay | Admitting: Family Medicine

## 2020-03-02 DIAGNOSIS — M62830 Muscle spasm of back: Secondary | ICD-10-CM

## 2020-03-04 ENCOUNTER — Other Ambulatory Visit: Payer: Self-pay | Admitting: "Endocrinology

## 2020-03-07 ENCOUNTER — Other Ambulatory Visit: Payer: Self-pay | Admitting: Family Medicine

## 2020-03-12 ENCOUNTER — Telehealth: Payer: Self-pay

## 2020-03-12 NOTE — Telephone Encounter (Signed)
Patient called in this morning stating she tried to go pick up her oxycodone from the pharmacy and they told her she couldn't pick it up until tomorrow because that is the date that is written on the prescription. She stated she took her last pill yesterday so the count is off. I reviewed her prescriptions and told her that was the date the provider had put on the prescriptions. I offered to send the message to the provider letting her know. She declined the offer.

## 2020-03-19 ENCOUNTER — Ambulatory Visit (INDEPENDENT_AMBULATORY_CARE_PROVIDER_SITE_OTHER): Payer: Medicare Other | Admitting: Orthotics

## 2020-03-19 ENCOUNTER — Other Ambulatory Visit: Payer: Self-pay

## 2020-03-19 DIAGNOSIS — E1142 Type 2 diabetes mellitus with diabetic polyneuropathy: Secondary | ICD-10-CM

## 2020-03-22 ENCOUNTER — Ambulatory Visit (INDEPENDENT_AMBULATORY_CARE_PROVIDER_SITE_OTHER): Payer: Medicare Other | Admitting: Nurse Practitioner

## 2020-03-22 ENCOUNTER — Other Ambulatory Visit: Payer: Self-pay

## 2020-03-22 ENCOUNTER — Encounter: Payer: Self-pay | Admitting: Nurse Practitioner

## 2020-03-22 VITALS — BP 139/91 | HR 90 | Ht 64.0 in | Wt 317.0 lb

## 2020-03-22 DIAGNOSIS — E1165 Type 2 diabetes mellitus with hyperglycemia: Secondary | ICD-10-CM | POA: Diagnosis not present

## 2020-03-22 DIAGNOSIS — E039 Hypothyroidism, unspecified: Secondary | ICD-10-CM | POA: Diagnosis not present

## 2020-03-22 DIAGNOSIS — I10 Essential (primary) hypertension: Secondary | ICD-10-CM | POA: Diagnosis not present

## 2020-03-22 DIAGNOSIS — E782 Mixed hyperlipidemia: Secondary | ICD-10-CM

## 2020-03-22 LAB — POCT GLYCOSYLATED HEMOGLOBIN (HGB A1C): HbA1c, POC (controlled diabetic range): 12.1 % — AB (ref 0.0–7.0)

## 2020-03-22 MED ORDER — INSULIN LISPRO PROT & LISPRO (75-25 MIX) 100 UNIT/ML KWIKPEN: 70.0000 [IU] | PEN_INJECTOR | Freq: Two times a day (BID) | SUBCUTANEOUS | 3 refills | Status: DC

## 2020-03-22 NOTE — Progress Notes (Signed)
03/22/2020, 12:52 PM                    Endocrinology follow-up note   Subjective:    [name], female    DOB: 04-10-1961.  Angela [name] is being seen in follow-up in the management of currently uncontrolled symptomatic type 2 diabetes.  She also has comorbid dyslipidemia, hypertension.   PMD:  Fayrene Helper, MD.   Past Medical History:  Diagnosis Date  . Anxiety   . Arthritis   . Asthma   . Coronary atherosclerosis of native coronary artery    Mild nonobstructive CAD by cardiac catheterization 2008 - Dr. Terrence Dupont  . Degenerative disc disease   . Depression   . Dysrhythmia   . Essential hypertension, benign   . Folliculitis 3/89/3734  . GERD (gastroesophageal reflux disease)   . H/O hiatal hernia   . Helicobacter pylori gastritis 2008  . Hypercholesterolemia   . Hypertension   . Hyperthyroidism    s/p radiation  . Left-sided epistaxis 05/15/2018  . Low back pain   . Migraine   . Nephrolithiasis    Recurring episodes since 2004  . Severe obesity (BMI >= 40) (HCC)   . Sleep apnea    STOP BANG SCORE 6no cpap used  . Type 2 diabetes mellitus with diabetic neuropathy [facility])     Past Surgical History:  Procedure Laterality Date  . ABDOMINAL HYSTERECTOMY    . BACK SURGERY  1993  . BACK SURGERY  1994  . BACK SURGERY  2002  . BACK SURGERY  2011   [place] Left   . CHOLECYSTECTOMY  1996  . COLONOSCOPY  2000 [facility]   INT HEMORRHOIDS/FISSURE  . COLONOSCOPY  2003 [facility], CHANGE IN BOWEL HABITS   INT HEMORRHOIDS  . COLONOSCOPY  2006 [facility]   INT HEMORRHOIDS  . COLONOSCOPY  2007 [facility]   INT HEMORRHOIDS  . CYSTOSCOPY WITH STENT PLACEMENT Left 07/25/2012   Procedure: CYSTOSCOPY, left retograde pyelogram WITH left ureteral  STENT PLACEMENT;  Surgeon: Claybon Jabs, MD;  Location: [facility];  Service: Urology;  Laterality: Left;  . CYSTOSCOPY/RETROGRADE/URETEROSCOPY  12/04/2011   Procedure:  CYSTOSCOPY/RETROGRADE/URETEROSCOPY;  Surgeon: Molli Hazard, MD;  Location: [facility];  Service: Urology;  Laterality: Right;  . CYSTOSCOPY/RETROGRADE/URETEROSCOPY/STONE EXTRACTION WITH BASKET Left 09/03/2012   Procedure: CYSTOSCOPY/RETROGRADE pyelogram/digital URETEROSCOPY/STONE EXTRACTION WITH BASKET, left stent removal;  Surgeon: Molli Hazard, MD;  Location: [facility];  Service: Urology;  Laterality: Left;  . ESOPHAGOGASTRODUODENOSCOPY  2008   [facility]:GOTLX hiatal hernia./Normal esophagus without evidence of Barrett's mass, stricture, erosion or ulceration./Normal duodenal bulb and second portion of the duodenum./Diffuse erythema in the body and the antrum with occasional erosion.  Biopsies obtained via cold forceps to evaluate for H. pylori gastritis  . FRACTURE SURGERY  1999   right clavicle  . HOLMIUM LASER APPLICATION Left 10/30/6201   Procedure: HOLMIUM LASER APPLICATION;  Surgeon: Molli Hazard, MD;  Location: [facility];  Service: Urology;  Laterality: Left;  . KNEE ARTHROSCOPY  10/04/2010,    right knee arthroscopy, dr Theda Sers  . LEFT HEART CATH AND CORONARY ANGIOGRAPHY N/A 05/08/2018   Procedure: LEFT HEART CATH AND CORONARY ANGIOGRAPHY;  Surgeon: Troy Sine, MD;  Location: [place] [facility];  Service: Cardiovascular;  Laterality: N/A;  . left knee arthroscopic surgery  1999  . Left salphingectomy secondary to ectopic pregnancy  1991  . PARTIAL HYSTERECTOMY  1991  . RIGHT HEART CATH N/A 07/07/2019   Procedure: RIGHT HEART CATH;  Surgeon: Leonie Man, MD;  Location: [place] [facility];  Service: Cardiovascular;  Laterality: N/A;  . rotary cuff  Right 05/14/2014   [facility]  . UPPER GASTROINTESTINAL ENDOSCOPY  2008 abd pain   H. pylori gastritis  . UPPER GASTROINTESTINAL ENDOSCOPY  1996 [facility] [place]   PUD    Social History   Socioeconomic History  . Marital status: Single    Spouse name: Not on file  . Number of children: 1  . Years of education: 17  . Highest  education level: 12th grade  Occupational History  . Occupation: Disable   Tobacco Use  . Smoking status: Never Smoker  . Smokeless tobacco: Never Used  Vaping Use  . Vaping Use: Never used  Substance and Sexual Activity  . Alcohol use: No  . Drug use: No  . Sexual activity: Not Currently    Partners: Male    Birth control/protection: Surgical    Comment: hyst  Other Topics Concern  . Not on file  Social History Narrative   Lives alone    Social Determinants of Health   Financial Resource Strain:   . Difficulty of Paying Living Expenses: Not on file  Food Insecurity:   . Worried About Charity fundraiser in the Last Year: Not on file  . Ran Out of Food in the Last Year: Not on file  Transportation Needs:   . Lack of Transportation (Medical): Not on file  . Lack of Transportation (Non-Medical): Not on file  Physical Activity:   . Days of Exercise per Week: Not on file  . Minutes of Exercise per Session: Not on file  Stress:   . Feeling of Stress : Not on file  Social Connections:   . Frequency of Communication with Friends and Family: Not on file  . Frequency of Social Gatherings with Friends and Family: Not on file  . Attends Religious Services: Not on file  . Active Member of Clubs or Organizations: Not on file  . Attends Archivist Meetings: Not on file  . Marital Status: Not on file    Family History  Problem Relation Age of Onset  . Heart disease Mother   . Hypertension Mother   . Cancer Mother        Cervical   . Asthma Mother   . Heart attack Father   . Cancer Father        Prostate  . Colon cancer Father        DECEASED AGE 74  . Colon polyps Neg Hx     Outpatient Encounter Medications as of 03/22/2020  Medication Sig  . albuterol (VENTOLIN HFA) 108 (90 Base) MCG/ACT inhaler Inhale 2 puffs into the lungs every 6 (six) hours as needed for wheezing or shortness of breath.  Marland Kitchen amLODipine (NORVASC) 10 MG tablet TAKE 1 TABLET BY MOUTH EVERY DAY   . B-D UF III MINI PEN NEEDLES 31G X 5 MM MISC USE ONCE DAILY AS DIRECTED  . betamethasone, augmented, (DIPROLENE) 0.05 % lotion   . blood glucose meter kit and supplies Dispense based on patient and insurance preference. Once daily testing dx. e11.9  . calcipotriene (DOVONOX) 0.005 % ointment Apply 1 application topically daily.   . cloNIDine (CATAPRES - DOSED IN MG/24 HR) 0.3 mg/24hr patch APPLY 1 PATCH TO SKIN ONCE A WEEK  . cloNIDine (CATAPRES) 0.3 MG tablet TAKE 1 TABLET (  0.3 MG TOTAL) BY MOUTH AT BEDTIME.  . cyclobenzaprine (FLEXERIL) 10 MG tablet TAKE 1 TABLET BY MOUTH THREE TIMES A DAY  . diazepam (VALIUM) 5 MG tablet Take one tablet by mouth three times daily for back spasm  . DULoxetine (CYMBALTA) 60 MG capsule TAKE 1 CAPSULE BY MOUTH TWICE A DAY (Patient taking differently: Take 60 mg by mouth 2 (two) times daily. )  . ergocalciferol (VITAMIN D2) 1.25 MG (50000 UT) capsule Take 1 capsule (50,000 Units total) by mouth once a week. One capsule once weekly (Patient taking differently: Take 50,000 Units by mouth every Monday. )  . esomeprazole (NEXIUM) 40 MG capsule TAKE 1 CAPSULE BY MOUTH EVERY DAY  . FLUoxetine (PROZAC) 20 MG capsule Take by mouth.  . fluticasone (FLONASE) 50 MCG/ACT nasal spray Place 2 sprays into both nostrils daily.  . furosemide (LASIX) 40 MG tablet Take 1 tablet (40 mg total) by mouth daily.  Marland Kitchen gabapentin (NEURONTIN) 800 MG tablet TAKE 1 TABLET BY MOUTH FOUR TIMES A DAY  . glipiZIDE (GLUCOTROL XL) 10 MG 24 hr tablet TAKE 1 TABLET BY MOUTH EVERY DAY WITH BREAKFAST  . hydrOXYzine (ATARAX/VISTARIL) 10 MG tablet Take 10 mg by mouth in the morning, at noon, and at bedtime.   . Insulin Lispro Prot & Lispro (HUMALOG 75/25 MIX) (75-25) 100 UNIT/ML Kwikpen Inject 70 Units into the skin 2 (two) times daily. If glucose is above 90 and she is eating  . ipratropium-albuterol (DUONEB) 0.5-2.5 (3) MG/3ML SOLN Take 3 mLs by nebulization every 6 (six) hours as needed. (Patient taking  differently: Take 3 mLs by nebulization in the morning, at noon, and at bedtime. )  . KLOR-CON M20 20 MEQ tablet TAKE 1 TABLET BY MOUTH TWICE A DAY  . Lancets (ONETOUCH DELICA PLUS [id number]) MISC TEST ONCE DAILY  . levothyroxine (SYNTHROID) 75 MCG tablet Take 1 tablet (75 mcg total) by mouth daily.  Marland Kitchen loratadine (CLARITIN) 10 MG tablet Take 1 tablet (10 mg total) by mouth daily.  . Magnesium Oxide 400 MG CAPS Take one (Patient taking differently: Take 400 mg by mouth at bedtime. Take one)  . meclizine (ANTIVERT) 25 MG tablet Take 25 mg by mouth in the morning, at noon, and at bedtime.   . metFORMIN (GLUCOPHAGE) 500 MG tablet TAKE 1 TABLET (500 MG TOTAL) BY MOUTH 2 (TWO) TIMES DAILY WITH A MEAL.  . metoprolol tartrate (LOPRESSOR) 25 MG tablet TAKE 1 TABLET BY MOUTH TWICE A DAY  . nitroGLYCERIN (NITROSTAT) 0.4 MG SL tablet Place 1 tablet (0.4 mg total) under the tongue every 5 (five) minutes as needed for chest pain.  Marland Kitchen ondansetron (ZOFRAN) 4 MG tablet Take 1 tablet (4 mg total) by mouth every 8 (eight) hours as needed for nausea or vomiting.  Glory Rosebush VERIO test strip USE AS INSTRUCTED FOUR TIMES DAILY TESTING DX E11.65  . oxyCODONE (OXYCONTIN) 40 mg 12 hr tablet Take 1 tablet (40 mg total) by mouth every 12 (twelve) hours.  Derrill Memo ON 04/12/2020] oxyCODONE (OXYCONTIN) 40 mg 12 hr tablet Take 1 tablet (40 mg total) by mouth every 12 (twelve) hours.  . SYMBICORT 160-4.5 MCG/ACT inhaler TAKE 2 PUFFS BY MOUTH TWICE A DAY (Patient taking differently: Inhale 2 puffs into the lungs in the morning and at bedtime. )  . UNABLE TO FIND Glucerna 114 U/M or PRN Chux 90/M or PRN Incontinence pads 198/M or PRN  Tub Mat x 1  Grab bars  . UNABLE TO FIND Reacher/Grabber x 1  Lift chair x 1  Lifeline X1  . UNABLE TO FIND SHARPS CONTAINER  1/Y BED SUPPORT RAIL    1/Y  . UNABLE TO FIND Replacement tips for quad cane. Package of 4  . [DISCONTINUED] Insulin Lispro Prot & Lispro (HUMALOG 75/25 MIX) (75-25) 100  UNIT/ML Kwikpen Inject 60 Units into the skin 2 (two) times daily.  Marland Kitchen zolpidem (AMBIEN) 10 MG tablet Take 1 tablet (10 mg total) by mouth at bedtime.   No facility-administered encounter medications on file as of 03/22/2020.    ALLERGIES: Allergies  Allergen Reactions  . Morphine Other (See Comments)    Reaction with esophagus, unable to swallow.   . Ace Inhibitors Cough  . Aspirin Other (See Comments)    Reaction with esophagus, unable to swallow.   . Losartan Cough  . Tapentadol Other (See Comments)    Nausea, increased sleepiness, h/a  . Tomato Other (See Comments)    Acid reflux due to acid in tomato  . Adhesive [Tape] Rash    VACCINATION STATUS: Immunization History  Administered Date(s) Administered  . Influenza Split 03/30/2011  . Influenza Whole 03/21/2005, 02/09/2006  . Influenza,inj,Quad PF,6+ Mos 02/11/2014, 07/27/2015, 03/09/2016, 01/29/2017, 02/07/2018, 01/30/2019, 02/11/2020  . Influenza-Unspecified 01/29/2017  . PFIZER SARS-COV-2 Vaccination 07/22/2019, 08/12/2019, 03/05/2020  . Pneumococcal Polysaccharide-23 04/17/2006, 09/14/2015  . Td 04/17/2006    Diabetes She presents for her follow-up diabetic visit. She has type 2 diabetes mellitus. Her disease course has been worsening. There are no hypoglycemic associated symptoms. Pertinent negatives for hypoglycemia include no confusion, headaches, pallor or seizures. Associated symptoms include fatigue, polydipsia and polyuria. Pertinent negatives for diabetes include no blurred vision, no chest pain and no polyphagia. There are no hypoglycemic complications. Symptoms are worsening. Diabetic complications include heart disease, nephropathy and peripheral neuropathy. Risk factors for coronary artery disease include dyslipidemia, diabetes mellitus, family history, hypertension, obesity, sedentary lifestyle, tobacco exposure and post-menopausal. Current diabetic treatment includes oral agent (dual therapy) and insulin  injections. She is compliant with treatment most of the time. Her weight is increasing rapidly (She lost 9 pounds since last visit.). She is following a generally unhealthy (Attempting to make better changes in her diet) diet. When asked about meal planning, she reported none. She has had a previous visit with a dietitian. She never participates in exercise. Her home blood glucose trend is increasing steadily. Her breakfast blood glucose range is generally >200 mg/dl. Her lunch blood glucose range is generally >200 mg/dl. Her dinner blood glucose range is generally >200 mg/dl. Her bedtime blood glucose range is generally >200 mg/dl. Her overall blood glucose range is >200 mg/dl. (She presents today with her meter, no logs, showing persistently high fasting and postprandial glycemic profile.  Her POCT A1c today is 12.1%, worsening from last visit of 11.4%.  She reports she has visited her brother in [place] and was eating/drinking poorly.  Analysis of her meter shows 7-day average of 283 with 4 readings; 14-day average of 262 with 11 readings; 30-day average of 293 with 24 readings; 90-day average of 254 with 79 readings.  She admits to injecting insulin without proper glucose monitoring.  She denies any hypoglycemia. ) An ACE inhibitor/angiotensin II receptor blocker is being taken. She does not see a podiatrist.Eye exam is not current.  Hyperlipidemia This is a chronic problem. The current episode started more than 1 year ago. The problem is controlled. Exacerbating diseases include chronic renal disease, diabetes, hypothyroidism and obesity. Factors aggravating her hyperlipidemia include beta blockers. Pertinent negatives  include no chest pain, myalgias or shortness of breath. She is currently on no antihyperlipidemic treatment. Compliance problems include adherence to diet and adherence to exercise.  Risk factors for coronary artery disease include diabetes mellitus, dyslipidemia, hypertension, obesity and a  sedentary lifestyle.  Hypertension This is a chronic problem. The current episode started more than 1 year ago. The problem has been gradually improving since onset. The problem is controlled. Associated symptoms include peripheral edema. Pertinent negatives include no blurred vision, chest pain, headaches, palpitations or shortness of breath. Agents associated with hypertension include thyroid hormones. Risk factors for coronary artery disease include family history, dyslipidemia, diabetes mellitus, obesity, sedentary lifestyle and post-menopausal state. Past treatments include calcium channel blockers, diuretics and beta blockers. The current treatment provides moderate improvement. Compliance problems include diet and exercise.  Hypertensive end-organ damage includes CAD/MI and heart failure. Identifiable causes of hypertension include chronic renal disease and sleep apnea.    Review of systems  Constitutional: + Minimally fluctuating body weight,  current Body mass index is 54.41 kg/m. , no fatigue, no subjective hyperthermia, no subjective hypothermia Eyes: no blurry vision, no xerophthalmia ENT: no sore throat, no nodules palpated in throat, no dysphagia/odynophagia, no hoarseness Cardiovascular: no chest pain, no shortness of breath, no palpitations, no leg swelling Respiratory: no cough, no shortness of breath Gastrointestinal: no nausea/vomiting/diarrhea Musculoskeletal: no muscle/joint aches Skin: no rashes, no hyperemia Neurological: no tremors, no numbness, no tingling, no dizziness Psychiatric: no depression, no anxiety    Objective:    BP (!) 139/91   Pulse 90   Ht $R'5\' 4"'sz$  (1.626 m)   Wt (!) 317 lb (143.8 kg)   BMI 54.41 kg/m   Wt Readings from Last 3 Encounters:  03/22/20 (!) 317 lb (143.8 kg)  02/11/20 (!) 302 lb (137 kg)  01/01/20 294 lb (133.4 kg)    BP Readings from Last 3 Encounters:  03/22/20 (!) 139/91  02/11/20 140/78  01/01/20 (!) 144/90     Physical  Exam- Limited  Constitutional:  Body mass index is 54.41 kg/m. , not in acute distress, normal state of mind Eyes:  EOMI, no exophthalmos Neck: Supple Thyroid: No gross goiter Cardiovascular: RRR, no murmers, rubs, or gallops, no edema Respiratory: Adequate breathing efforts, no crackles, rales, rhonchi, or wheezing Musculoskeletal: no gross deformities, strength intact in all four extremities, no gross restriction of joint movements Skin:  no rashes, no hyperemia Neurological: no tremor with outstretched hands    CMP ( most recent) CMP     Component Value Date/Time   NA 136 02/11/2020 1111   K 4.6 02/11/2020 1111   CL 95 (L) 02/11/2020 1111   CO2 30 02/11/2020 1111   GLUCOSE 316 (H) 02/11/2020 1111   BUN 18 02/11/2020 1111   CREATININE 0.91 02/11/2020 1111   CALCIUM 9.5 02/11/2020 1111   PROT 7.5 02/11/2020 1111   ALBUMIN 4.0 06/05/2019 1019   AST 72 (H) 02/11/2020 1111   ALT 80 (H) 02/11/2020 1111   ALKPHOS 92 06/05/2019 1019   BILITOT 0.6 02/11/2020 1111   GFRNONAA 69 02/11/2020 1111   GFRAA 80 02/11/2020 1111    Diabetic Labs (most recent): Lab Results  Component Value Date   HGBA1C 12.1 (A) 03/22/2020   HGBA1C 11.4 (A) 11/20/2019   HGBA1C 11.8 (A) 07/16/2019     Lipid Panel ( most recent) Lipid Panel     Component Value Date/Time   CHOL 178 11/10/2019 1303   TRIG 244 (H) 11/10/2019 1303   HDL 44 (  L) 11/10/2019 1303   CHOLHDL 4.0 11/10/2019 1303   VLDL 16 05/06/2018 0335   LDLCALC 97 11/10/2019 1303      Assessment & Plan:   1) Uncontrolled type 2 diabetes mellitus with hyperglycemia ([place])  - Angela [name] has currently uncontrolled symptomatic type 2 DM since  59 years of age.  She presents today with her meter, no logs, showing persistently high fasting and postprandial glycemic profile.  Her POCT A1c today is 12.1%, worsening from last visit of 11.4%.  She reports she has visited her brother in [place] and was eating/drinking poorly.  Analysis of her  meter shows 7-day average of 283 with 4 readings; 14-day average of 262 with 11 readings; 30-day average of 293 with 24 readings; 90-day average of 254 with 79 readings.  She admits to injecting insulin without proper glucose monitoring.  She denies any hypoglycemia.   Recent labs reviewed.  -her diabetes is complicated by coronary artery disease, CHF, obesity/sedentary life and she remains at a high risk for more acute and chronic complications which include CAD, CVA, CKD, retinopathy, and neuropathy. These are all discussed in detail with her.  - Nutritional counseling repeated at each appointment due to patients tendency to fall back in to old habits.  - The patient admits there is a room for improvement in their diet and drink choices. -  Suggestion is made for the patient to avoid simple carbohydrates from their diet including Cakes, Sweet Desserts / Pastries, Ice Cream, Soda (diet and regular), Sweet Tea, Candies, Chips, Cookies, Sweet Pastries,  Store Bought Juices, Alcohol in Excess of  1-2 drinks a day, Artificial Sweeteners, Coffee Creamer, and "Sugar-free" Products. This will help patient to have stable blood glucose profile and potentially avoid unintended weight gain.   - I encouraged the patient to switch to  unprocessed or minimally processed complex starch and increased protein intake (animal or plant source), fruits, and vegetables.   - Patient is advised to stick to a routine mealtimes to eat 3 meals  a day and avoid unnecessary snacks ( to snack only to correct hypoglycemia).  - she has previously been scheduled with Jearld Fenton, RDN, CDE for individualized diabetes education.  - I have approached her with the following individualized plan to manage diabetes and patient agrees:   -Given her presentation with chronic glycemic burden, she will likely require multiple daily injections of insulin in order for her to achieve control of diabetes to target.  -Given her recent loss  of control, she is advised to increase her Humalog 75/25 to 70 units SQ twice daily with breakfast and supper if blood glucose above 90 and eating to gain better control of her diabetes.  She is also advised to continue Metformin 500 mg p.o. twice daily with meals and glipizide 10 mg daily with breakfast.  -She is encouraged to consistently monitor glucose 4 times daily, before meals and before bed, and to call the clinic if she has readings less than 70 or greater than 300 for 3 tests in a row.  - she is warned not to take insulin without proper monitoring per orders.  2) Blood Pressure /Hypertension: Her blood pressure is controlled to target.  She is advised to continue Norvasc 10 mg po daily, Clonidine 0.3 mg patch once weekly, Clonidine 0.3 mg po daily at bedtime, Lasix 40 mg po daily, and Metoprolol 25 mg po twice daily.  3) Lipids/Hyperlipidemia:  Her most recent lipid panel from 11/10/19 shows controlled LDL at  97 and elevated triglycerides of 244.  It does not appear that she is on anything to lower her lipids.  Will consider adding statin on subsequent visits if needed after next lipid check.  May defer to PCP.  4)  Weight/Diet:  Her Body mass index is 54.41 kg/m. - clearly complicating her diabetes care.  She is a candidate for modest weight loss.  She has multiple comorbid situations complicating her chance of bariatric surgery.  I discussed with her the fact that loss of 5 - 10% of her  current body weight will have the most impact on her diabetes management.  CDE Consult will be initiated . Exercise, and detailed carbohydrates information provided  -  detailed on discharge instructions.  5) Hypothyroidism:  -Her previsit TFTs indicate slight under replacement. Her levothyroxine was increased to 75 mcg po daily before breakfast by her PCP.  She is advised to continue that dose until next TFT test.    - We discussed about the correct intake of her thyroid hormone, on empty stomach at  fasting, with water, separated by at least 30 minutes from breakfast and other medications,  and separated by more than 4 hours from calcium, iron, multivitamins, acid reflux medications (PPIs). -Patient is made aware of the fact that thyroid hormone replacement is needed for life, dose to be adjusted by periodic monitoring of thyroid function tests.  6) Chronic Care/Health Maintenance: -she  is not on ACE/ARB (intolerant to both) or statin medications and  is encouraged to initiate and continue to follow up with Ophthalmology, Dentist,  Podiatrist at least yearly or according to recommendations, and advised to  stay away from smoking. I have recommended yearly flu vaccine and pneumonia vaccine at least every 5 years; moderate intensity exercise for up to 150 minutes weekly; and  sleep for at least 7 hours a day.  - she is  advised to maintain close follow up with Fayrene Helper, MD for primary care needs, as well as her other providers for optimal and coordinated care.  - Time spent on this patient care encounter:  35 min, of which > 50% was spent in  counseling and the rest reviewing her blood glucose logs , discussing her hypoglycemia and hyperglycemia episodes, reviewing her current and  previous labs / studies  ( including abstraction from other facilities) and medications  doses and developing a  long term treatment plan and documenting her care.   Please refer to Patient Instructions for Blood Glucose Monitoring and Insulin/Medications Dosing Guide"  in media tab for additional information. Please  also refer to " Patient Self Inventory" in the Media  tab for reviewed elements of pertinent patient history.  [place] participated in the discussions, expressed understanding, and voiced agreement with the above plans.  All questions were answered to her satisfaction. she is encouraged to contact clinic should she have any questions or concerns prior to her return visit.  Follow up  plan: - Return in about 3 months (around 06/22/2020) for Diabetes follow up with A1c in office, Thyroid follow up, Previsit labs, ABI next visit.  Rayetta Pigg, [facility] [address] [place], [place] [address] Phone: [phone] Fax: [phone]   03/22/2020, 12:52 PM

## 2020-03-22 NOTE — Patient Instructions (Signed)

## 2020-03-28 ENCOUNTER — Other Ambulatory Visit: Payer: Self-pay | Admitting: "Endocrinology

## 2020-04-01 ENCOUNTER — Telehealth: Payer: Self-pay | Admitting: Adult Health

## 2020-04-01 DIAGNOSIS — Z9989 Dependence on other enabling machines and devices: Secondary | ICD-10-CM

## 2020-04-01 DIAGNOSIS — G4733 Obstructive sleep apnea (adult) (pediatric): Secondary | ICD-10-CM

## 2020-04-01 NOTE — Telephone Encounter (Signed)
Pt has called stating that [place] is asking for a new prescription for her CPAP supplies

## 2020-04-05 ENCOUNTER — Ambulatory Visit: Payer: Medicare Other | Admitting: Orthotics

## 2020-04-05 DIAGNOSIS — L400 Psoriasis vulgaris: Secondary | ICD-10-CM | POA: Diagnosis not present

## 2020-04-05 DIAGNOSIS — L280 Lichen simplex chronicus: Secondary | ICD-10-CM | POA: Diagnosis not present

## 2020-04-05 NOTE — Telephone Encounter (Signed)
Fax confirmation received for cpap order for [employer] [phone].

## 2020-04-05 NOTE — Addendum Note (Signed)
Addended by: Trudie Buckler on: 04/05/2020 01:01 PM   Modules accepted: Orders

## 2020-04-12 ENCOUNTER — Other Ambulatory Visit: Payer: Self-pay | Admitting: Family Medicine

## 2020-04-12 ENCOUNTER — Other Ambulatory Visit: Payer: Self-pay

## 2020-04-12 ENCOUNTER — Ambulatory Visit (INDEPENDENT_AMBULATORY_CARE_PROVIDER_SITE_OTHER): Payer: Medicare Other | Admitting: Internal Medicine

## 2020-04-12 ENCOUNTER — Ambulatory Visit: Payer: Medicare Other | Admitting: Orthotics

## 2020-04-12 VITALS — Ht 64.0 in | Wt 326.0 lb

## 2020-04-12 DIAGNOSIS — E7800 Pure hypercholesterolemia, unspecified: Secondary | ICD-10-CM

## 2020-04-12 DIAGNOSIS — E1159 Type 2 diabetes mellitus with other circulatory complications: Secondary | ICD-10-CM | POA: Diagnosis not present

## 2020-04-12 DIAGNOSIS — I10 Essential (primary) hypertension: Secondary | ICD-10-CM

## 2020-04-12 DIAGNOSIS — R0600 Dyspnea, unspecified: Secondary | ICD-10-CM | POA: Diagnosis not present

## 2020-04-12 DIAGNOSIS — M79675 Pain in left toe(s): Secondary | ICD-10-CM

## 2020-04-12 DIAGNOSIS — R0609 Other forms of dyspnea: Secondary | ICD-10-CM

## 2020-04-12 DIAGNOSIS — I5032 Chronic diastolic (congestive) heart failure: Secondary | ICD-10-CM | POA: Diagnosis not present

## 2020-04-12 DIAGNOSIS — Q828 Other specified congenital malformations of skin: Secondary | ICD-10-CM

## 2020-04-12 NOTE — Patient Instructions (Signed)

## 2020-04-12 NOTE — Progress Notes (Signed)
Cardiology Office Note:    Date:  04/12/2020   ID:  Angela [name], DOB 01/01/1961, MRN [id number]  PCP:  Fayrene Helper, MD  Cardiologist:  Elouise Munroe, MD  Electrophysiologist:  None   Referring MD: Fayrene Helper, MD   Chief Complaint/Reason for Referral: Follow up DOE, LE edema  History of Present Illness:    Angela [name] is a 59 y.o. female with a history of HTN, HLD, and diabetes mellitus. Presents for follow up 6 mo from our last visit.   She is doing extremely well. She notes that her activity level has increased at the urging of her daughter. Her dyspnea and LE swelling have improved. "Sizzling" sensation on left flank has resolved.  The patient denies chest pain, chest pressure, palpitations, PND, orthopnea, or leg swelling. Denies cough, fever, chills. Denies nausea, vomiting. Denies syncope or presyncope. Denies dizziness or lightheadedness. Denies snoring.  Past Medical History:  Diagnosis Date  . Anxiety   . Arthritis   . Asthma   . Asthma    Phreesia 04/12/2020  . CHF (congestive heart failure) ([place])    Phreesia 04/12/2020  . Coronary atherosclerosis of native coronary artery    Mild nonobstructive CAD by cardiac catheterization 2008 - Dr. Terrence Dupont  . Degenerative disc disease   . Depression   . Depression    Phreesia 04/12/2020  . Diabetes mellitus without complication ([place])    Phreesia 04/12/2020  . Dysrhythmia   . Essential hypertension, benign   . Folliculitis 8/65/7846  . GERD (gastroesophageal reflux disease)   . H/O hiatal hernia   . Helicobacter pylori gastritis 2008  . Hypercholesterolemia   . Hypertension   . Hyperthyroidism    s/p radiation  . Left-sided epistaxis 05/15/2018  . Low back pain   . Migraine   . Nephrolithiasis    Recurring episodes since 2004  . Neuromuscular disorder ([place])    Phreesia 04/12/2020  . Severe obesity (BMI >= 40) (H[facility])   . Sleep apnea    STOP BANG SCORE 6no cpap used  . Thyroid disease     Phreesia 04/12/2020  . Type 2 diabetes mellitus with diabetic neuropathy [facility])     Past Surgical History:  Procedure Laterality Date  . ABDOMINAL HYSTERECTOMY    . BACK SURGERY  1993  . BACK SURGERY  1994  . BACK SURGERY  2002  . BACK SURGERY  2011   [place] Left   . CHOLECYSTECTOMY  1996  . COLONOSCOPY  2000 [facility]   INT HEMORRHOIDS/FISSURE  . COLONOSCOPY  2003 [facility], CHANGE IN BOWEL HABITS   INT HEMORRHOIDS  . COLONOSCOPY  2006 [facility]   INT HEMORRHOIDS  . COLONOSCOPY  2007 [facility]   INT HEMORRHOIDS  . CYSTOSCOPY WITH STENT PLACEMENT Left 07/25/2012   Procedure: CYSTOSCOPY, left retograde pyelogram WITH left ureteral  STENT PLACEMENT;  Surgeon: Claybon Jabs, MD;  Location: [facility];  Service: Urology;  Laterality: Left;  . CYSTOSCOPY/RETROGRADE/URETEROSCOPY  12/04/2011   Procedure: CYSTOSCOPY/RETROGRADE/URETEROSCOPY;  Surgeon: Molli Hazard, MD;  Location: [facility];  Service: Urology;  Laterality: Right;  . CYSTOSCOPY/RETROGRADE/URETEROSCOPY/STONE EXTRACTION WITH BASKET Left 09/03/2012   Procedure: CYSTOSCOPY/RETROGRADE pyelogram/digital URETEROSCOPY/STONE EXTRACTION WITH BASKET, left stent removal;  Surgeon: Molli Hazard, MD;  Location: [facility];  Service: Urology;  Laterality: Left;  . ESOPHAGOGASTRODUODENOSCOPY  2008   NGE:XBMWU hiatal hernia./Normal esophagus without evidence of Barrett's mass, stricture, erosion or ulceration./Normal duodenal bulb and second portion of the duodenum./Diffuse  erythema in the body and the antrum with occasional erosion.  Biopsies obtained via cold forceps to evaluate for H. pylori gastritis  . FRACTURE SURGERY  1999   right clavicle  . HOLMIUM LASER APPLICATION Left 01/06/9891   Procedure: HOLMIUM LASER APPLICATION;  Surgeon: Molli Hazard, MD;  Location: [facility];  Service: Urology;  Laterality: Left;  . KNEE ARTHROSCOPY  10/04/2010,    right knee arthroscopy, dr Theda Sers  . LEFT HEART CATH AND CORONARY  ANGIOGRAPHY N/A 05/08/2018   Procedure: LEFT HEART CATH AND CORONARY ANGIOGRAPHY;  Surgeon: Troy Sine, MD;  Location: [place] [facility];  Service: Cardiovascular;  Laterality: N/A;  . left knee arthroscopic surgery  1999  . Left salphingectomy secondary to ectopic pregnancy  1991  . PARTIAL HYSTERECTOMY  1991  . RIGHT HEART CATH N/A 07/07/2019   Procedure: RIGHT HEART CATH;  Surgeon: Leonie Man, MD;  Location: [place] CV [facility];  Service: Cardiovascular;  Laterality: N/A;  . rotary cuff  Right 05/14/2014   [facility]  . UPPER GASTROINTESTINAL ENDOSCOPY  2008 abd pain   H. pylori gastritis  . UPPER GASTROINTESTINAL ENDOSCOPY  1996 [place]   PUD    Current Medications: Current Meds  Medication Sig  . albuterol (VENTOLIN HFA) 108 (90 Base) MCG/ACT inhaler Inhale 2 puffs into the lungs every 6 (six) hours as needed for wheezing or shortness of breath.  Marland Kitchen amLODipine (NORVASC) 10 MG tablet TAKE 1 TABLET BY MOUTH EVERY DAY  . B-D UF III MINI PEN NEEDLES 31G X 5 MM MISC USE ONCE DAILY AS DIRECTED  . betamethasone, augmented, (DIPROLENE) 0.05 % lotion Apply 1 application topically 2 (two) times daily.  . blood glucose meter kit and supplies Dispense based on patient and insurance preference. Once daily testing dx. e11.9  . calcipotriene (DOVONOX) 0.005 % ointment Apply 1 application topically daily.   . clobetasol cream (TEMOVATE) 1.19 % Apply 1 application topically 2 (two) times daily.  . cloNIDine (CATAPRES - DOSED IN MG/24 HR) 0.3 mg/24hr patch APPLY 1 PATCH TO SKIN ONCE A WEEK  . cloNIDine (CATAPRES) 0.3 MG tablet TAKE 1 TABLET (0.3 MG TOTAL) BY MOUTH AT BEDTIME.  . cyclobenzaprine (FLEXERIL) 10 MG tablet TAKE 1 TABLET BY MOUTH THREE TIMES A DAY  . diazepam (VALIUM) 5 MG tablet Take one tablet by mouth three times daily for back spasm  . DULoxetine (CYMBALTA) 60 MG capsule TAKE 1 CAPSULE BY MOUTH TWICE A DAY  . ergocalciferol (VITAMIN D2) 1.25 MG (50000 UT) capsule Take 1 capsule  (50,000 Units total) by mouth once a week. One capsule once weekly  . esomeprazole (NEXIUM) 40 MG capsule TAKE 1 CAPSULE BY MOUTH EVERY DAY  . FLUoxetine (PROZAC) 20 MG capsule Take by mouth.  . fluticasone (FLONASE) 50 MCG/ACT nasal spray Place 2 sprays into both nostrils daily.  . furosemide (LASIX) 40 MG tablet Take 1 tablet (40 mg total) by mouth daily.  Marland Kitchen gabapentin (NEURONTIN) 800 MG tablet TAKE 1 TABLET BY MOUTH FOUR TIMES A DAY  . glipiZIDE (GLUCOTROL XL) 10 MG 24 hr tablet TAKE 1 TABLET BY MOUTH EVERY DAY WITH BREAKFAST  . hydrOXYzine (ATARAX/VISTARIL) 10 MG tablet Take 10 mg by mouth in the morning, at noon, and at bedtime.  . insulin lispro protamine-lispro (HUMALOG 75/25 MIX) (75-25) 100 UNIT/ML SUSP injection Inject 25 Units into the skin 2 (two) times daily with a meal.  . ipratropium-albuterol (DUONEB) 0.5-2.5 (3) MG/3ML SOLN Take 3 mLs by nebulization  every 6 (six) hours as needed.  Marland Kitchen KLOR-CON M20 20 MEQ tablet TAKE 1 TABLET BY MOUTH TWICE A DAY  . Lancets (ONETOUCH DELICA PLUS [id number]) MISC TEST ONCE DAILY  . levothyroxine (SYNTHROID) 75 MCG tablet Take 1 tablet (75 mcg total) by mouth daily.  Marland Kitchen loratadine (CLARITIN) 10 MG tablet Take 1 tablet (10 mg total) by mouth daily.  . Magnesium Oxide 400 MG CAPS Take one  . meclizine (ANTIVERT) 25 MG tablet Take 25 mg by mouth in the morning, at noon, and at bedtime.  . metFORMIN (GLUCOPHAGE) 500 MG tablet TAKE 1 TABLET (500 MG TOTAL) BY MOUTH 2 (TWO) TIMES DAILY WITH A MEAL.  . metoprolol tartrate (LOPRESSOR) 25 MG tablet TAKE 1 TABLET BY MOUTH TWICE A DAY  . nitroGLYCERIN (NITROSTAT) 0.4 MG SL tablet Place 1 tablet (0.4 mg total) under the tongue every 5 (five) minutes as needed for chest pain.  Marland Kitchen ondansetron (ZOFRAN) 4 MG tablet Take 1 tablet (4 mg total) by mouth every 8 (eight) hours as needed for nausea or vomiting.  Glory Rosebush VERIO test strip USE AS INSTRUCTED FOUR TIMES DAILY TESTING DX E11.65  . oxyCODONE (OXYCONTIN) 40 mg 12  hr tablet Take 1 tablet (40 mg total) by mouth every 12 (twelve) hours.  Marland Kitchen oxyCODONE (OXYCONTIN) 40 mg 12 hr tablet Take 1 tablet (40 mg total) by mouth every 12 (twelve) hours.  . SYMBICORT 160-4.5 MCG/ACT inhaler TAKE 2 PUFFS BY MOUTH TWICE A DAY  . UNABLE TO FIND Glucerna 114 U/M or PRN Chux 90/M or PRN Incontinence pads 198/M or PRN  Tub Mat x 1  Grab bars  . UNABLE TO FIND Reacher/Grabber x 1  Lift chair x 1  Lifeline X1  . UNABLE TO FIND SHARPS CONTAINER  1/Y BED SUPPORT RAIL    1/Y  . UNABLE TO FIND Replacement tips for quad cane. Package of 4     Allergies:   Morphine, Other, Ace inhibitors, Aspirin, Losartan, Tapentadol, Tomato, and Adhesive [tape]   Social History   Tobacco Use  . Smoking status: Never Smoker  . Smokeless tobacco: Never Used  Vaping Use  . Vaping Use: Never used  Substance Use Topics  . Alcohol use: No  . Drug use: No     Family History: The patient's family history includes Asthma in her mother; Cancer in her father and mother; Colon cancer in her father; Heart attack in her father; Heart disease in her mother; Hypertension in her mother. There is no history of Colon polyps.  ROS:   Please see the history of present illness.    All other systems reviewed and are negative.  EKGs/Labs/Other Studies Reviewed:    The following studies were reviewed today:  EKG:  NSR, left axis deviation, poor R wave progression   Recent Labs: 06/05/2019: NT-Pro BNP 194 07/03/2019: Platelets 389 07/07/2019: Hemoglobin 12.6 02/11/2020: ALT 80; BUN 18; Creat 0.91; Potassium 4.6; Sodium 136; TSH 6.31  Recent Lipid Panel    Component Value Date/Time   CHOL 178 11/10/2019 1303   TRIG 244 (H) 11/10/2019 1303   HDL 44 (L) 11/10/2019 1303   CHOLHDL 4.0 11/10/2019 1303   VLDL 16 05/06/2018 0335   LDLCALC 97 11/10/2019 1303    Physical Exam:    VS:136/60, HR 85, O2 99%  Ht $R'5\' 4"'nl$  (1.626 m)   Wt (!) 326 lb (147.9 kg)   SpO2 99%   BMI 55.96 kg/m     Wt Readings  from Last 5  Encounters:  04/12/20 (!) 326 lb (147.9 kg)  03/22/20 (!) 317 lb (143.8 kg)  02/11/20 (!) 302 lb (137 kg)  01/01/20 294 lb (133.4 kg)  12/31/19 (!) 317 lb 6.4 oz (144 kg)    Constitutional: No acute distress Eyes: sclera non-icteric, normal conjunctiva and lids ENMT: normal dentition, moist mucous membranes Cardiovascular: regular rhythm, normal rate, no murmurs. S1 and S2 normal. Radial pulses normal bilaterally. No jugular venous distention.  Respiratory: clear to auscultation bilaterally GI : normal bowel sounds, soft and nontender. No distention.   MSK: extremities warm, well perfused. Diffuse edema.  NEURO: grossly nonfocal exam, moves all extremities. PSYCH: alert and oriented x 3, normal mood and affect.   ASSESSMENT:    1. Chronic diastolic heart failure ([place])   2. DOE (dyspnea on exertion)   3. Hypercholesterolemia   4. Essential hypertension   5. Type 2 diabetes mellitus with vascular disease ([place])    PLAN:    Abdominal pain - "sizzle" in left flank has resolved.   Chronic diastolic HF DOE - improved with increased activity. Encouraged her to continue her positive steps.  - can continue lasix as needed though limited by cramping.    HLD - LDL quite low, this appears to be the reason her crestor was stopped. Would resume if LDL > 70 on future evaluation.   HTN - BP mildly elevated but stable. Follow with PCP.   Total time of encounter: 30 minutes total time of encounter, including 20 minutes spent in face-to-face patient care on the date of this encounter. This time includes coordination of care and counseling regarding above mentioned problem list. Remainder of non-face-to-face time involved reviewing chart documents/testing relevant to the patient encounter and documentation in the medical record. I have independently reviewed documentation from referring provider.   Cherlynn Kaiser, MD [place]  [facility]    Medication Adjustments/Labs and  Tests Ordered: Current medicines are reviewed at length with the patient today.  Concerns regarding medicines are outlined above.   Orders Placed This Encounter  Procedures  . EKG 12-Lead    Patient Instructions  Medication Instructions:  No Changes In Medications at this time.  *If you need a refill on your cardiac medications before your next appointment, please call your pharmacy*  Follow-Up: At [facility], you and your health needs are our priority.  As part of our continuing mission to provide you with exceptional heart care, we have created designated Provider Care Teams.  These Care Teams include your primary Cardiologist (physician) and Advanced Practice Providers (APPs -  Physician Assistants and Nurse Practitioners) who all work together to provide you with the care you need, when you need it.  Your next appointment:   6 month(s)  The format for your next appointment:   In Person  Provider:   Cherlynn Kaiser, MD

## 2020-04-12 NOTE — Progress Notes (Signed)
Picked up NB 9 4E shoe.

## 2020-04-13 ENCOUNTER — Telehealth: Payer: Self-pay

## 2020-04-13 NOTE — Telephone Encounter (Signed)
Advise her to cut her Humalog 75/25 down to 60 units twice daily (with breakfast and supper) for now.  Advise her to reach back out if she continues to have low readings so we can adjust further if needed.

## 2020-04-13 NOTE — Telephone Encounter (Signed)
Patient called with concerns of BG being low in the am, readings are as follows, 12/14 am 63, 12/13 pm 178, am 156, 12/12 am 168, 12/11 107am 152pm, 12/10 249 am, 12/9 149am 89pm. Plaese advise

## 2020-04-13 NOTE — Telephone Encounter (Signed)
Patient is calling to speak to a nurse because she is saying that her meter is showing low readings.

## 2020-04-13 NOTE — Telephone Encounter (Signed)
Called patient back and advised, verbalized understanding

## 2020-04-13 NOTE — Telephone Encounter (Signed)
Returned call to patient, no answer, left VM to call back

## 2020-04-15 ENCOUNTER — Ambulatory Visit (INDEPENDENT_AMBULATORY_CARE_PROVIDER_SITE_OTHER): Payer: Medicare Other | Admitting: Family Medicine

## 2020-04-15 ENCOUNTER — Other Ambulatory Visit: Payer: Self-pay

## 2020-04-15 ENCOUNTER — Encounter: Payer: Self-pay | Admitting: Family Medicine

## 2020-04-15 VITALS — BP 139/91 | Ht 64.0 in | Wt 313.0 lb

## 2020-04-15 DIAGNOSIS — Z0000 Encounter for general adult medical examination without abnormal findings: Secondary | ICD-10-CM | POA: Diagnosis not present

## 2020-04-15 NOTE — Patient Instructions (Addendum)
[name] , Thank you for taking time to come for your Medicare Wellness Visit. I appreciate your ongoing commitment to your health goals. Please review the following plan we discussed and let me know if I can assist you in the future.   MERRY CHRISTMAS AND HAPPY NEW YEAR!  Please continue to practice social distancing to keep you, your family, and our community safe.  If you must go out, please wear a Mask and practice good handwashing.  Screening recommendations/referrals: Colonoscopy: 10/06/29 Mammogram: Patient will call to schedule. Bone Density: Age 59 Recommended yearly ophthalmology/optometry visit for glaucoma screening and checkup Recommended yearly dental visit for hygiene and checkup  Vaccinations: Influenza vaccine: Fall 2022 Pneumococcal vaccine: Complete Tdap vaccine: Due Shingles vaccine: Declined  Advanced directives: No  Conditions/risks identified: None  Next appointment: 05/07/19 @ 1:40 pm  Preventive Care 40-64 Years, Female Preventive care refers to lifestyle choices and visits with your health care provider that can promote health and wellness. What does preventive care include?  A yearly physical exam. This is also called an annual well check.  Dental exams once or twice a year.  Routine eye exams. Ask your health care provider how often you should have your eyes checked.  Personal lifestyle choices, including:  Daily care of your teeth and gums.  Regular physical activity.  Eating a healthy diet.  Avoiding tobacco and drug use.  Limiting alcohol use.  Practicing safe sex.  Taking low-dose aspirin daily starting at age 61.  Taking vitamin and mineral supplements as recommended by your health care provider. What happens during an annual well check? The services and screenings done by your health care provider during your annual well check will depend on your age, overall health, lifestyle risk factors, and family history of  disease. Counseling  Your health care provider may ask you questions about your:  Alcohol use.  Tobacco use.  Drug use.  Emotional well-being.  Home and relationship well-being.  Sexual activity.  Eating habits.  Work and work Astronomer.  Method of birth control.  Menstrual cycle.  Pregnancy history. Screening  You may have the following tests or measurements:  Height, weight, and BMI.  Blood pressure.  Lipid and cholesterol levels. These may be checked every 5 years, or more frequently if you are over 37 years old.  Skin check.  Lung cancer screening. You may have this screening every year starting at age 54 if you have a 30-pack-year history of smoking and currently smoke or have quit within the past 15 years.  Fecal occult blood test (FOBT) of the stool. You may have this test every year starting at age 19.  Flexible sigmoidoscopy or colonoscopy. You may have a sigmoidoscopy every 5 years or a colonoscopy every 10 years starting at age 95.  Hepatitis C blood test.  Hepatitis B blood test.  Sexually transmitted disease (STD) testing.  Diabetes screening. This is done by checking your blood sugar (glucose) after you have not eaten for a while (fasting). You may have this done every 1-3 years.  Mammogram. This may be done every 1-2 years. Talk to your health care provider about when you should start having regular mammograms. This may depend on whether you have a family history of breast cancer.  BRCA-related cancer screening. This may be done if you have a family history of breast, ovarian, tubal, or peritoneal cancers.  Pelvic exam and Pap test. This may be done every 3 years starting at age 19. Starting at age  30, this may be done every 5 years if you have a Pap test in combination with an HPV test.  Bone density scan. This is done to screen for osteoporosis. You may have this scan if you are at high risk for osteoporosis. Discuss your test results,  treatment options, and if necessary, the need for more tests with your health care provider. Vaccines  Your health care provider may recommend certain vaccines, such as:  Influenza vaccine. This is recommended every year.  Tetanus, diphtheria, and acellular pertussis (Tdap, Td) vaccine. You may need a Td booster every 10 years.  Zoster vaccine. You may need this after age 17.  Pneumococcal 13-valent conjugate (PCV13) vaccine. You may need this if you have certain conditions and were not previously vaccinated.  Pneumococcal polysaccharide (PPSV23) vaccine. You may need one or two doses if you smoke cigarettes or if you have certain conditions. Talk to your health care provider about which screenings and vaccines you need and how often you need them. This information is not intended to replace advice given to you by your health care provider. Make sure you discuss any questions you have with your health care provider. Document Released: 05/14/2015 Document Revised: 01/05/2016 Document Reviewed: 02/16/2015 Elsevier Interactive Patient Education  2017 [place] Prevention in the Home Falls can cause injuries. They can happen to people of all ages. There are many things you can do to make your home safe and to help prevent falls. What can I do on the outside of my home?  Regularly fix the edges of walkways and driveways and fix any cracks.  Remove anything that might make you trip as you walk through a door, such as a raised step or threshold.  Trim any bushes or trees on the path to your home.  Use bright outdoor lighting.  Clear any walking paths of anything that might make someone trip, such as rocks or tools.  Regularly check to see if handrails are loose or broken. Make sure that both sides of any steps have handrails.  Any raised decks and porches should have guardrails on the edges.  Have any leaves, snow, or ice cleared regularly.  Use sand or salt on walking  paths during winter.  Clean up any spills in your garage right away. This includes oil or grease spills. What can I do in the bathroom?  Use night lights.  Install grab bars by the toilet and in the tub and shower. Do not use towel bars as grab bars.  Use non-skid mats or decals in the tub or shower.  If you need to sit down in the shower, use a plastic, non-slip stool.  Keep the floor dry. Clean up any water that spills on the floor as soon as it happens.  Remove soap buildup in the tub or shower regularly.  Attach bath mats securely with double-sided non-slip rug tape.  Do not have throw rugs and other things on the floor that can make you trip. What can I do in the bedroom?  Use night lights.  Make sure that you have a light by your bed that is easy to reach.  Do not use any sheets or blankets that are too big for your bed. They should not hang down onto the floor.  Have a firm chair that has side arms. You can use this for support while you get dressed.  Do not have throw rugs and other things on the floor that can  make you trip. What can I do in the kitchen?  Clean up any spills right away.  Avoid walking on wet floors.  Keep items that you use a lot in easy-to-reach places.  If you need to reach something above you, use a strong step stool that has a grab bar.  Keep electrical cords out of the way.  Do not use floor polish or wax that makes floors slippery. If you must use wax, use non-skid floor wax.  Do not have throw rugs and other things on the floor that can make you trip. What can I do with my stairs?  Do not leave any items on the stairs.  Make sure that there are handrails on both sides of the stairs and use them. Fix handrails that are broken or loose. Make sure that handrails are as long as the stairways.  Check any carpeting to make sure that it is firmly attached to the stairs. Fix any carpet that is loose or worn.  Avoid having throw rugs at  the top or bottom of the stairs. If you do have throw rugs, attach them to the floor with carpet tape.  Make sure that you have a light switch at the top of the stairs and the bottom of the stairs. If you do not have them, ask someone to add them for you. What else can I do to help prevent falls?  Wear shoes that:  Do not have high heels.  Have rubber bottoms.  Are comfortable and fit you well.  Are closed at the toe. Do not wear sandals.  If you use a stepladder:  Make sure that it is fully opened. Do not climb a closed stepladder.  Make sure that both sides of the stepladder are locked into place.  Ask someone to hold it for you, if possible.  Clearly mark and make sure that you can see:  Any grab bars or handrails.  First and last steps.  Where the edge of each step is.  Use tools that help you move around (mobility aids) if they are needed. These include:  Canes.  Walkers.  Scooters.  Crutches.  Turn on the lights when you go into a dark area. Replace any light bulbs as soon as they burn out.  Set up your furniture so you have a clear path. Avoid moving your furniture around.  If any of your floors are uneven, fix them.  If there are any pets around you, be aware of where they are.  Review your medicines with your doctor. Some medicines can make you feel dizzy. This can increase your chance of falling. Ask your doctor what other things that you can do to help prevent falls. This information is not intended to replace advice given to you by your health care provider. Make sure you discuss any questions you have with your health care provider. Document Released: 02/11/2009 Document Revised: 09/23/2015 Document Reviewed: 05/22/2014 Elsevier Interactive Patient Education  2017 [employer].

## 2020-04-15 NOTE — Progress Notes (Addendum)
Subjective:   Angela [name] is a 59 y.o. female who presents for Medicare Annual (Subsequent) preventive examination.   Participants: Nurse for intake and work up; Patient and Provider for Visit and Wrap up  Method of visit: Telephone  Location of Patient: Home Location of Provider: Office Consent was obtain for visit over the telephone. Services rendered by provider: Visit was performed via telephone  I verified that I am speaking with the correct person using two identifiers.  Review of Systems Cardiac Risk Factors include: advanced age (>36men, >26 women);diabetes mellitus;obesity (BMI >30kg/m2)     Objective:    Today's Vitals   04/15/20 0831 04/15/20 0832  BP: (!) 139/91   Weight: (!) 313 lb (142 kg)   Height: $Remove'5\' 4"'vGgmRNM$  (1.626 m)   PainSc: 8  8   PainLoc: Back    Body mass index is 53.73 kg/m.  Advanced Directives 04/15/2020 07/07/2019 05/06/2018 05/05/2018 04/10/2018 07/24/2017 08/28/2016  Does Patient Have a Medical Advance Directive? No Yes No Yes No No Yes  Type of Advance Directive - [place];Living will - [place]  Does patient want to make changes to medical advance directive? - No - Patient declined - - - - -  Copy of [place] in Chart? - No - copy requested - No - copy requested - - -  Would patient like information on creating a medical advance directive? No - Patient declined - No - Patient declined No - Patient declined No - Patient declined No - Patient declined -  Pre-existing out of facility DNR order (yellow form or pink MOST form) - - - - - - -    Current Medications (verified) Outpatient Encounter Medications as of 04/15/2020  Medication Sig  . albuterol (VENTOLIN HFA) 108 (90 Base) MCG/ACT inhaler Inhale 2 puffs into the lungs every 6 (six) hours as needed for wheezing or shortness of breath.  Marland Kitchen amLODipine (NORVASC) 10 MG tablet TAKE 1 TABLET BY MOUTH EVERY DAY  . B-D UF III MINI PEN  NEEDLES 31G X 5 MM MISC USE ONCE DAILY AS DIRECTED  . betamethasone, augmented, (DIPROLENE) 0.05 % lotion Apply 1 application topically 2 (two) times daily.  . blood glucose meter kit and supplies Dispense based on patient and insurance preference. Once daily testing dx. e11.9  . calcipotriene (DOVONOX) 0.005 % ointment Apply 1 application topically daily.   . clobetasol cream (TEMOVATE) 1.61 % Apply 1 application topically 2 (two) times daily.  . cloNIDine (CATAPRES - DOSED IN MG/24 HR) 0.3 mg/24hr patch APPLY 1 PATCH TO SKIN ONCE A WEEK  . cloNIDine (CATAPRES) 0.3 MG tablet TAKE 1 TABLET (0.3 MG TOTAL) BY MOUTH AT BEDTIME.  . cyclobenzaprine (FLEXERIL) 10 MG tablet TAKE 1 TABLET BY MOUTH THREE TIMES A DAY  . diazepam (VALIUM) 5 MG tablet Take one tablet by mouth three times daily for back spasm  . DULoxetine (CYMBALTA) 60 MG capsule TAKE 1 CAPSULE BY MOUTH TWICE A DAY  . ergocalciferol (VITAMIN D2) 1.25 MG (50000 UT) capsule Take 1 capsule (50,000 Units total) by mouth once a week. One capsule once weekly  . esomeprazole (NEXIUM) 40 MG capsule TAKE 1 CAPSULE BY MOUTH EVERY DAY  . FLUoxetine (PROZAC) 20 MG capsule Take by mouth.  . fluticasone (FLONASE) 50 MCG/ACT nasal spray Place 2 sprays into both nostrils daily.  . furosemide (LASIX) 40 MG tablet Take 1 tablet (40 mg total) by mouth daily.  Marland Kitchen  gabapentin (NEURONTIN) 800 MG tablet TAKE 1 TABLET BY MOUTH FOUR TIMES A DAY  . glipiZIDE (GLUCOTROL XL) 10 MG 24 hr tablet TAKE 1 TABLET BY MOUTH EVERY DAY WITH BREAKFAST  . hydrOXYzine (ATARAX/VISTARIL) 10 MG tablet Take 10 mg by mouth in the morning, at noon, and at bedtime.  . insulin lispro protamine-lispro (HUMALOG 75/25 MIX) (75-25) 100 UNIT/ML SUSP injection Inject 25 Units into the skin 2 (two) times daily with a meal.  . ipratropium-albuterol (DUONEB) 0.5-2.5 (3) MG/3ML SOLN Take 3 mLs by nebulization every 6 (six) hours as needed.  Marland Kitchen KLOR-CON M20 20 MEQ tablet TAKE 1 TABLET BY MOUTH TWICE A  DAY  . Lancets (ONETOUCH DELICA PLUS [id number]) MISC TEST ONCE DAILY  . levothyroxine (SYNTHROID) 75 MCG tablet Take 1 tablet (75 mcg total) by mouth daily.  Marland Kitchen loratadine (CLARITIN) 10 MG tablet Take 1 tablet (10 mg total) by mouth daily.  . Magnesium Oxide 400 MG CAPS Take one  . meclizine (ANTIVERT) 25 MG tablet Take 25 mg by mouth in the morning, at noon, and at bedtime.  . metFORMIN (GLUCOPHAGE) 500 MG tablet TAKE 1 TABLET (500 MG TOTAL) BY MOUTH 2 (TWO) TIMES DAILY WITH A MEAL.  . metoprolol tartrate (LOPRESSOR) 25 MG tablet TAKE 1 TABLET BY MOUTH TWICE A DAY  . nitroGLYCERIN (NITROSTAT) 0.4 MG SL tablet Place 1 tablet (0.4 mg total) under the tongue every 5 (five) minutes as needed for chest pain.  Marland Kitchen ondansetron (ZOFRAN) 4 MG tablet Take 1 tablet (4 mg total) by mouth every 8 (eight) hours as needed for nausea or vomiting.  Glory Rosebush VERIO test strip USE AS INSTRUCTED FOUR TIMES DAILY TESTING DX E11.65  . oxyCODONE (OXYCONTIN) 40 mg 12 hr tablet Take 1 tablet (40 mg total) by mouth every 12 (twelve) hours.  . SYMBICORT 160-4.5 MCG/ACT inhaler TAKE 2 PUFFS BY MOUTH TWICE A DAY  . UNABLE TO FIND Glucerna 114 U/M or PRN Chux 90/M or PRN Incontinence pads 198/M or PRN  Tub Mat x 1  Grab bars  . UNABLE TO FIND Reacher/Grabber x 1  Lift chair x 1  Lifeline X1  . UNABLE TO FIND SHARPS CONTAINER  1/Y BED SUPPORT RAIL    1/Y  . UNABLE TO FIND Replacement tips for quad cane. Package of 4  . zolpidem (AMBIEN) 10 MG tablet Take 1 tablet (10 mg total) by mouth at bedtime.   No facility-administered encounter medications on file as of 04/15/2020.    Allergies (verified) Morphine, Other, Ace inhibitors, Aspirin, Losartan, Tapentadol, Tomato, and Adhesive [tape]   History: Past Medical History:  Diagnosis Date  . Anxiety   . Arthritis   . Asthma   . Asthma    Phreesia 04/12/2020  . CHF (congestive heart failure) ([place])    Phreesia 04/12/2020  . Coronary atherosclerosis of native  coronary artery    Mild nonobstructive CAD by cardiac catheterization 2008 - Dr. Terrence Dupont  . Degenerative disc disease   . Depression   . Depression    Phreesia 04/12/2020  . Diabetes mellitus without complication ([place])    Phreesia 04/12/2020  . Dysrhythmia   . Essential hypertension, benign   . Folliculitis 1/74/0814  . GERD (gastroesophageal reflux disease)   . H/O hiatal hernia   . Helicobacter pylori gastritis 2008  . Hypercholesterolemia   . Hypertension   . Hyperthyroidism    s/p radiation  . Left-sided epistaxis 05/15/2018  . Low back pain   . Migraine   .  Nephrolithiasis    Recurring episodes since 2004  . Neuromuscular disorder ([place])    Phreesia 04/12/2020  . Severe obesity (BMI >= 40) ([facility])   . Sleep apnea    STOP BANG SCORE 6no cpap used  . Thyroid disease    Phreesia 04/12/2020  . Type 2 diabetes mellitus with diabetic neuropathy [facility]    Past Surgical History:  Procedure Laterality Date  . ABDOMINAL HYSTERECTOMY    . BACK SURGERY  1993  . BACK SURGERY  1994  . BACK SURGERY  2002  . BACK SURGERY  2011   [place] Left   . CHOLECYSTECTOMY  1996  . COLONOSCOPY  2000 [facility]   INT HEMORRHOIDS/FISSURE  . COLONOSCOPY  2003 [facility], CHANGE IN BOWEL HABITS   INT HEMORRHOIDS  . COLONOSCOPY  2006 [facility]   INT HEMORRHOIDS  . COLONOSCOPY  2007 [facility]   INT HEMORRHOIDS  . CYSTOSCOPY WITH STENT PLACEMENT Left 07/25/2012   Procedure: CYSTOSCOPY, left retograde pyelogram WITH left ureteral  STENT PLACEMENT;  Surgeon: Claybon Jabs, MD;  Location: [facility];  Service: Urology;  Laterality: Left;  . CYSTOSCOPY/RETROGRADE/URETEROSCOPY  12/04/2011   Procedure: CYSTOSCOPY/RETROGRADE/URETEROSCOPY;  Surgeon: Molli Hazard, MD;  Location: [facility];  Service: Urology;  Laterality: Right;  . CYSTOSCOPY/RETROGRADE/URETEROSCOPY/STONE EXTRACTION WITH BASKET Left 09/03/2012   Procedure: CYSTOSCOPY/RETROGRADE pyelogram/digital URETEROSCOPY/STONE EXTRACTION  WITH BASKET, left stent removal;  Surgeon: Molli Hazard, MD;  Location: [facility];  Service: Urology;  Laterality: Left;  . ESOPHAGOGASTRODUODENOSCOPY  2008   FAO:ZHYQM hiatal hernia./Normal esophagus without evidence of Barrett's mass, stricture, erosion or ulceration./Normal duodenal bulb and second portion of the duodenum./Diffuse erythema in the body and the antrum with occasional erosion.  Biopsies obtained via cold forceps to evaluate for H. pylori gastritis  . FRACTURE SURGERY  1999   right clavicle  . HOLMIUM LASER APPLICATION Left 09/05/8467   Procedure: HOLMIUM LASER APPLICATION;  Surgeon: Molli Hazard, MD;  Location: [facility];  Service: Urology;  Laterality: Left;  . KNEE ARTHROSCOPY  10/04/2010,    right knee arthroscopy, dr Theda Sers  . LEFT HEART CATH AND CORONARY ANGIOGRAPHY N/A 05/08/2018   Procedure: LEFT HEART CATH AND CORONARY ANGIOGRAPHY;  Surgeon: Troy Sine, MD;  Location: [place] [facility];  Service: Cardiovascular;  Laterality: N/A;  . left knee arthroscopic surgery  1999  . Left salphingectomy secondary to ectopic pregnancy  1991  . PARTIAL HYSTERECTOMY  1991  . RIGHT HEART CATH N/A 07/07/2019   Procedure: RIGHT HEART CATH;  Surgeon: Leonie Man, MD;  Location: [place] [facility];  Service: Cardiovascular;  Laterality: N/A;  . rotary cuff  Right 05/14/2014   [facility]  . UPPER GASTROINTESTINAL ENDOSCOPY  2008 abd pain   H. pylori gastritis  . UPPER GASTROINTESTINAL ENDOSCOPY  1996 [facility] [place]   PUD   Family History  Problem Relation Age of Onset  . Heart disease Mother   . Hypertension Mother   . Cancer Mother        Cervical   . Asthma Mother   . Heart attack Father   . Cancer Father        Prostate  . Colon cancer Father        DECEASED AGE 51  . Colon polyps Neg Hx    Social History   Socioeconomic History  . Marital status: Single    Spouse name: Not on file  . Number of children: 1  . Years  of education: 10  . Highest education  level: 12th grade  Occupational History  . Occupation: Disable   Tobacco Use  . Smoking status: Never Smoker  . Smokeless tobacco: Never Used  Vaping Use  . Vaping Use: Never used  Substance and Sexual Activity  . Alcohol use: No  . Drug use: No  . Sexual activity: Not Currently    Partners: Male    Birth control/protection: Surgical    Comment: hyst  Other Topics Concern  . Not on file  Social History Narrative   Lives alone    Social Determinants of Health   Financial Resource Strain: Low Risk   . Difficulty of Paying Living Expenses: Not hard at all  Food Insecurity: No Food Insecurity  . Worried About Charity fundraiser in the Last Year: Never true  . Ran Out of Food in the Last Year: Never true  Transportation Needs: No Transportation Needs  . Lack of Transportation (Medical): No  . Lack of Transportation (Non-Medical): No  Physical Activity: Sufficiently Active  . Days of Exercise per Week: 3 days  . Minutes of Exercise per Session: 60 min  Stress: No Stress Concern Present  . Feeling of Stress : Not at all  Social Connections: Socially Isolated  . Frequency of Communication with Friends and Family: More than three times a week  . Frequency of Social Gatherings with Friends and Family: More than three times a week  . Attends Religious Services: Never  . Active Member of Clubs or Organizations: No  . Attends Archivist Meetings: Never  . Marital Status: Divorced    Tobacco Counseling Counseling given: Yes   Clinical Intake:  Pre-visit preparation completed: Yes  Pain : 0-10 Pain Score: 8  Pain Type: Chronic pain Pain Location: Back Pain Orientation: Lower Pain Descriptors / Indicators: Aching Pain Onset: More than a month ago Pain Frequency: Intermittent Pain Relieving Factors: pain meds, rest  Pain Relieving Factors: pain meds, rest  BMI - recorded: 55.96 Nutritional Status: BMI > 30  Obese Nutritional Risks: None Diabetes: Yes CBG  done?: No Did pt. bring in CBG monitor from home?: No  How often do you need to have someone help you when you read instructions, pamphlets, or other written materials from your doctor or pharmacy?: 1 - Never What is the last grade level you completed in school?: 12  Diabetic? Yes  Interpreter Needed?: No  Information entered by :: Laretta Bolster, LPN   Activities of Daily Living In your present state of health, do you have any difficulty performing the following activities: 04/15/2020  Hearing? N  Vision? N  Difficulty concentrating or making decisions? N  Walking or climbing stairs? Y  Dressing or bathing? Y  Doing errands, shopping? Y  Preparing Food and eating ? Y  Using the Toilet? Y  In the past six months, have you accidently leaked urine? N  Do you have problems with loss of bowel control? N  Managing your Medications? N  Managing your Finances? N  Housekeeping or managing your Housekeeping? Y  Some recent data might be hidden    Patient Care Team: Fayrene Helper, MD as PCP - General Elouise Munroe, MD as PCP - Cardiology (Cardiology) Danie Binder, MD (Inactive) (Gastroenterology) Rolan Bucco, MD as Attending Physician (Urology) Sydnee Cabal, MD as Consulting Physician (Orthopedic Surgery) [place], Clarene Duke, OT as Occupational Therapist (Occupational Therapy)  Indicate any recent Medical Services you may have received from other than  [facility] providers in the past year (date may be approximate).     Assessment:   This is a routine wellness examination for Angela.  Hearing/Vision screen No exam data present  Dietary issues and exercise activities discussed: Current Exercise Habits: Home exercise routine, Type of exercise: walking, Time (Minutes): 60, Frequency (Times/Week): 3, Weekly Exercise (Minutes/Week): 180, Intensity: Mild  Goals    . DIET - DECREASE SODA OR JUICE INTAKE    . Exercise 3x per week (30 min per time)     Patient would  like to start walking 3 times a week for 60 minutes at a time.      Depression Screen PHQ 2/9 Scores 04/15/2020 02/11/2020 11/10/2019 10/10/2019 05/29/2019 04/15/2019 01/30/2019  PHQ - 2 Score 1 0 0 0 0 0 0  PHQ- 9 Score - - - 0 - - -    Fall Risk Fall Risk  04/15/2020 02/11/2020 11/10/2019 10/10/2019 06/25/2019  Falls in the past year? 1 0 0 0 1  Number falls in past yr: 1 0 0 0 1  Injury with Fall? 0 0 0 0 1  Risk for fall due to : No Fall Risks - History of fall(s);Impaired balance/gait;Impaired mobility No Fall Risks -  Follow up Falls evaluation completed Falls evaluation completed Falls evaluation completed;Education provided;Falls prevention discussed Falls evaluation completed -    FALL RISK PREVENTION PERTAINING TO THE HOME:  Any stairs in or around the home? No  If so, are there any without handrails? n/a Home free of loose throw rugs in walkways, pet beds, electrical cords, etc? Yes  Adequate lighting in your home to reduce risk of falls? Yes   ASSISTIVE DEVICES UTILIZED TO PREVENT FALLS:  Life alert? Yes  Use of a cane, walker or w/c? Yes  Grab bars in the bathroom? No  Shower chair or bench in shower? Yes  Elevated toilet seat or a handicapped toilet? Yes   TIMED UP AND GO:  Was the test performed? No .  Length of time to ambulate n/a   Cognitive Function:     6CIT Screen 04/15/2019 04/10/2018 05/03/2016  What Year? 0 points 0 points 0 points  What month? 0 points 0 points 0 points  What time? 0 points 0 points 0 points  Count back from 20 0 points 0 points 0 points  Months in reverse 0 points 0 points 0 points  Repeat phrase 0 points 4 points 0 points  Total Score 0 4 0    Immunizations Immunization History  Administered Date(s) Administered  . Influenza Split 03/30/2011  . Influenza Whole 03/21/2005, 02/09/2006  . Influenza,inj,Quad PF,6+ Mos 02/11/2014, 07/27/2015, 03/09/2016, 01/29/2017, 02/07/2018, 01/30/2019, 02/11/2020  . Influenza-Unspecified  01/29/2017  . PFIZER SARS-COV-2 Vaccination 07/22/2019, 08/12/2019, 03/05/2020  . Pneumococcal Polysaccharide-23 04/17/2006, 09/14/2015  . Td 04/17/2006    TDAP status: Due, Education has been provided regarding the importance of this vaccine. Advised may receive this vaccine at local pharmacy or Health Dept. Aware to provide a copy of the vaccination record if obtained from local pharmacy or Health Dept. Verbalized acceptance and understanding.  Flu Vaccine status: Up to date  Pneumococcal vaccine status: Up to date  Covid-19 vaccine status: Completed vaccines  Qualifies for Shingles Vaccine? Yes   Zostavax completed No   Shingrix Completed?: No.    Education has been provided regarding the importance of this vaccine. Patient has been advised to call insurance company to determine out of pocket expense if they have not yet received this vaccine.  Advised may also receive vaccine at local pharmacy or Health Dept. Verbalized acceptance and understanding.  Screening Tests Health Maintenance  Topic Date Due  . PAP SMEAR-Modifier  01/28/2017  . TETANUS/TDAP  04/21/2020 (Originally 04/17/2016)  . HEMOGLOBIN A1C  09/19/2020  . OPHTHALMOLOGY EXAM  12/24/2020  . FOOT EXAM  02/14/2021  . MAMMOGRAM  04/06/2021  . COLONOSCOPY  10/06/2029  . INFLUENZA VACCINE  Completed  . PNEUMOCOCCAL POLYSACCHARIDE VACCINE AGE 61-64 HIGH RISK  Completed  . COVID-19 Vaccine  Completed  . Hepatitis C Screening  Completed  . HIV Screening  Completed    Health Maintenance  Health Maintenance Due  Topic Date Due  . PAP SMEAR-Modifier  01/28/2017    Colorectal cancer screening: Type of screening: Colonoscopy. Completed 10/07/19. Repeat every 10 years  Mammogram status: Ordered patient received letter, is going to call schedule. Pt provided with contact info and advised to call to schedule appt.    Lung Cancer Screening: (Low Dose CT Chest recommended if Age 79-80 years, 30 pack-year currently smoking OR  have quit w/in 15years.) does not qualify.   Lung Cancer Screening Referral: n/a  Additional Screening:   Hepatitis C Screening: does not qualify  Vision Screening: Recommended annual ophthalmology exams for early detection of glaucoma and other disorders of the eye. Is the patient up to date with their annual eye exam?  Yes  Who is the provider or what is the name of the office in which the patient attends annual eye exams? Dr. Gershon Crane  If pt is not established with a provider, would they like to be referred to a provider to establish care? n/a.   Dental Screening: Recommended annual dental exams for proper oral hygiene  Community Resource Referral / Chronic Care Management: CRR required this visit?  No   CCM required this visit?  No      Plan:      1. Encounter for Medicare annual wellness exam   I have personally reviewed and noted the following in the patient's chart:   . Medical and social history . Use of alcohol, tobacco or illicit drugs  . Current medications and supplements . Functional ability and status . Nutritional status . Physical activity . Advanced directives . List of other physicians . Hospitalizations, surgeries, and ER visits in previous 12 months . Vitals . Screenings to include cognitive, depression, and falls . Referrals and appointments  In addition, I have reviewed and discussed with patient certain preventive protocols, quality metrics, and best practice recommendations. A written personalized care plan for preventive services as well as general preventive health recommendations were provided to patient.    Agreed with the above documentation   Perlie Mayo, NP   04/15/2020   Nurse Notes: AWV conducted by nurse in office by phone. Patient gave consent to telehealth visit via audio. Patient at home at time of visit. Provider in the office. Visit took 30 minutes to complete.

## 2020-04-26 ENCOUNTER — Other Ambulatory Visit: Payer: Self-pay | Admitting: Nurse Practitioner

## 2020-04-28 ENCOUNTER — Other Ambulatory Visit: Payer: Self-pay | Admitting: Family Medicine

## 2020-04-28 DIAGNOSIS — M62830 Muscle spasm of back: Secondary | ICD-10-CM

## 2020-04-30 ENCOUNTER — Other Ambulatory Visit: Payer: Self-pay | Admitting: Pulmonary Disease

## 2020-05-04 ENCOUNTER — Ambulatory Visit: Payer: Medicare Other | Admitting: Podiatry

## 2020-05-06 ENCOUNTER — Other Ambulatory Visit: Payer: Self-pay

## 2020-05-06 ENCOUNTER — Ambulatory Visit (INDEPENDENT_AMBULATORY_CARE_PROVIDER_SITE_OTHER): Payer: Medicare Other | Admitting: Family Medicine

## 2020-05-06 ENCOUNTER — Encounter: Payer: Self-pay | Admitting: Family Medicine

## 2020-05-06 VITALS — BP 161/81 | HR 88 | Resp 16 | Ht 64.0 in | Wt 321.0 lb

## 2020-05-06 DIAGNOSIS — E1159 Type 2 diabetes mellitus with other circulatory complications: Secondary | ICD-10-CM | POA: Diagnosis not present

## 2020-05-06 DIAGNOSIS — R52 Pain, unspecified: Secondary | ICD-10-CM

## 2020-05-06 DIAGNOSIS — F322 Major depressive disorder, single episode, severe without psychotic features: Secondary | ICD-10-CM | POA: Diagnosis not present

## 2020-05-06 DIAGNOSIS — I10 Essential (primary) hypertension: Secondary | ICD-10-CM

## 2020-05-06 DIAGNOSIS — E1165 Type 2 diabetes mellitus with hyperglycemia: Secondary | ICD-10-CM | POA: Diagnosis not present

## 2020-05-06 DIAGNOSIS — I5032 Chronic diastolic (congestive) heart failure: Secondary | ICD-10-CM | POA: Diagnosis not present

## 2020-05-06 NOTE — Patient Instructions (Signed)
Lbas today, lipid, cmp and eGFR, cBC and tox assure  Please schedule mammogram at checkout  Think about what you will eat, plan ahead. Choose " clean, green, fresh or frozen" over canned, processed or packaged foods which are more sugary, salty and fatty. 70 to 75% of food eaten should be vegetables and fruit. Three meals at set times with snacks allowed between meals, but they must be fruit or vegetables. Aim to eat over a 12 hour period , example 7 am to 7 pm, and STOP after  your last meal of the day. Drink water,generally about 64 ounces per day, no other drink is as healthy. Fruit juice is best enjoyed in a healthy way, by EATING the fruit.

## 2020-05-07 LAB — CBC
Hematocrit: 40.9 % (ref 34.0–46.6)
Hemoglobin: 13.2 g/dL (ref 11.1–15.9)
MCH: 25.7 pg — ABNORMAL LOW (ref 26.6–33.0)
MCHC: 32.3 g/dL (ref 31.5–35.7)
MCV: 80 fL (ref 79–97)
Platelets: 495 10*3/uL — ABNORMAL HIGH (ref 150–450)
RBC: 5.13 x10E6/uL (ref 3.77–5.28)
RDW: 13.4 % (ref 11.7–15.4)
WBC: 7.6 10*3/uL (ref 3.4–10.8)

## 2020-05-07 LAB — CMP14+EGFR
ALT: 27 IU/L (ref 0–32)
AST: 30 IU/L (ref 0–40)
Albumin/Globulin Ratio: 1.1 — ABNORMAL LOW (ref 1.2–2.2)
Albumin: 4.3 g/dL (ref 3.8–4.9)
Alkaline Phosphatase: 102 IU/L (ref 44–121)
BUN/Creatinine Ratio: 19 (ref 12–28)
BUN: 17 mg/dL (ref 8–27)
Bilirubin Total: 0.2 mg/dL (ref 0.0–1.2)
CO2: 25 mmol/L (ref 20–29)
Calcium: 9.9 mg/dL (ref 8.7–10.3)
Chloride: 100 mmol/L (ref 96–106)
Creatinine, Ser: 0.9 mg/dL (ref 0.57–1.00)
GFR calc Af Amer: 80 mL/min/{1.73_m2} (ref >59)
GFR calc non Af Amer: 70 mL/min/{1.73_m2} (ref >59)
Globulin, Total: 4 g/dL (ref 1.5–4.5)
Glucose: 76 mg/dL (ref 65–99)
Potassium: 4.4 mmol/L (ref 3.5–5.2)
Sodium: 141 mmol/L (ref 134–144)
Total Protein: 8.3 g/dL (ref 6.0–8.5)

## 2020-05-07 LAB — LIPID PANEL
Chol/HDL Ratio: 4.5 ratio — ABNORMAL HIGH (ref 0.0–4.4)
Cholesterol, Total: 197 mg/dL (ref 100–199)
HDL: 44 mg/dL (ref >39)
LDL Chol Calc (NIH): 124 mg/dL — ABNORMAL HIGH (ref 0–99)
Triglycerides: 164 mg/dL — ABNORMAL HIGH (ref 0–149)
VLDL Cholesterol Cal: 29 mg/dL (ref 5–40)

## 2020-05-07 MED ORDER — OXYCODONE HCL ER 40 MG PO T12A
EXTENDED_RELEASE_TABLET | ORAL | 0 refills | Status: AC
Start: 2020-05-12 — End: 2020-06-11

## 2020-05-10 ENCOUNTER — Ambulatory Visit: Payer: Medicare Other | Admitting: Family Medicine
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
## 2020-05-12 LAB — TOXASSURE SELECT 13 (MW), URINE

## 2020-05-14 ENCOUNTER — Other Ambulatory Visit: Payer: Self-pay | Admitting: *Deleted

## 2020-05-14 ENCOUNTER — Other Ambulatory Visit: Payer: Self-pay | Admitting: Family Medicine

## 2020-05-14 ENCOUNTER — Encounter: Payer: Self-pay | Admitting: Family Medicine

## 2020-05-14 DIAGNOSIS — F322 Major depressive disorder, single episode, severe without psychotic features: Secondary | ICD-10-CM | POA: Insufficient documentation

## 2020-05-14 DIAGNOSIS — I10 Essential (primary) hypertension: Secondary | ICD-10-CM

## 2020-05-14 DIAGNOSIS — I25110 Atherosclerotic heart disease of native coronary artery with unstable angina pectoris: Secondary | ICD-10-CM

## 2020-05-14 MED ORDER — ROSUVASTATIN CALCIUM 5 MG PO TABS: ORAL_TABLET | ORAL | 1 refills | Status: DC

## 2020-05-14 NOTE — Assessment & Plan Note (Signed)
The patient's Controlled Substance registry is reviewed and compliance confirmed. Adequacy of  Pain control and level of function is assessed. Medication dosing is adjusted as deemed appropriate. Four  weeks of medication is prescribed , with a follow up appointment between 3 to 4 weeks .
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
## 2020-05-14 NOTE — Assessment & Plan Note (Signed)
Elevated and uncontroled at isit, pt in significant pain, will address at next visit DASH diet and commitment to daily physical activity for a minimum of 30 minutes discussed and encouraged, as a part of hypertension management. The importance of attaining a healthy weight is also discussed.  BP/Weight 05/06/2020 04/15/2020 04/12/2020 03/22/2020 02/11/2020 09/01/84 11/03/1948  Systolic BP 932 671 - 245 809 983 382  Diastolic BP 81 91 - 91 78 90 101  Wt. (Lbs) 321 313 326 317 302 294 317.4  BMI 55.1 53.73 55.96 54.41 51.84 50.46 54.48

## 2020-05-14 NOTE — Assessment & Plan Note (Signed)
Ms. Badders is reminded of the importance of commitment to daily physical activity for 30 minutes or more, as able and the need to limit carbohydrate intake to 30 to 60 grams per meal to help with blood sugar control.   The need to take medication as prescribed, test blood sugar as directed, and to call between visits if there is a concern that blood sugar is uncontrolled is also discussed.   Ms. Gritz is reminded of the importance of daily foot exam, annual eye examination, and good blood sugar, blood pressure and cholesterol control. Deteriorated, uncontrolled, and managed by Endo  Diabetic Labs Latest Ref Rng & Units 05/06/2020 03/22/2020 02/11/2020 11/20/2019 11/10/2019  HbA1c 0.0 - 7.0 % - 12.1(A) - 11.4(A) -  Microalbumin Not Estab. ug/mL - - - - 101.9(H)  Micro/Creat Ratio <30 mcg/mg creat - - - - -  Chol 100 - 199 mg/dL 197 - - - 178  HDL >39 mg/dL 44 - - - 44(L)  Calc LDL 0 - 99 mg/dL 124(H) - - - 97  Triglycerides 0 - 149 mg/dL 164(H) - - - 244(H)  Creatinine 0.57 - 1.00 mg/dL 0.90 - 0.91 - 0.90  GFR >60.00 mL/min - - - - -   BP/Weight 05/06/2020 04/15/2020 04/12/2020 03/22/2020 02/11/2020 12/30/4780 01/04/6212  Systolic BP 086 578 - 469 629 528 413  Diastolic BP 81 91 - 91 78 90 101  Wt. (Lbs) 321 313 326 317 302 294 317.4  BMI 55.1 53.73 55.96 54.41 51.84 50.46 54.48   Foot/eye exam completion dates Latest Ref Rng & Units 02/11/2020 12/25/2019  Eye Exam No Retinopathy - No Retinopathy  Foot Form Completion - Done -

## 2020-05-17 ENCOUNTER — Other Ambulatory Visit: Payer: Self-pay | Admitting: Family Medicine

## 2020-05-28 ENCOUNTER — Other Ambulatory Visit: Payer: Self-pay | Admitting: Family Medicine

## 2020-05-31 ENCOUNTER — Other Ambulatory Visit: Payer: Self-pay | Admitting: Family Medicine

## 2020-06-02 ENCOUNTER — Other Ambulatory Visit (HOSPITAL_COMMUNITY): Payer: Self-pay | Admitting: Family Medicine

## 2020-06-02 DIAGNOSIS — Z1231 Encounter for screening mammogram for malignant neoplasm of breast: Secondary | ICD-10-CM

## 2020-06-03 ENCOUNTER — Telehealth (INDEPENDENT_AMBULATORY_CARE_PROVIDER_SITE_OTHER): Payer: Medicare Other | Admitting: Family Medicine

## 2020-06-03 ENCOUNTER — Other Ambulatory Visit: Payer: Self-pay

## 2020-06-03 ENCOUNTER — Other Ambulatory Visit: Payer: Self-pay | Admitting: Family Medicine

## 2020-06-03 ENCOUNTER — Encounter: Payer: Self-pay | Admitting: Family Medicine

## 2020-06-03 VITALS — BP 154/94 | HR 79 | Ht 64.0 in | Wt 310.0 lb

## 2020-06-03 DIAGNOSIS — I10 Essential (primary) hypertension: Secondary | ICD-10-CM

## 2020-06-03 DIAGNOSIS — F5104 Psychophysiologic insomnia: Secondary | ICD-10-CM | POA: Diagnosis not present

## 2020-06-03 DIAGNOSIS — R52 Pain, unspecified: Secondary | ICD-10-CM

## 2020-06-03 MED ORDER — DIAZEPAM 5 MG PO TABS: ORAL_TABLET | ORAL | 5 refills | Status: DC

## 2020-06-03 NOTE — Patient Instructions (Addendum)
Visit in  Office with Dr Posey Pronto in 6 weeks evaluate and manage hypertension  F/U with dr. Moshe Cipro in 12 weeks, call if you need me sooner  You need to come in and sign your Pain contract in the next 1 week  Fasting lipid and hepatic panel in 12 weeks, you still need the non fasting  Hepatic panel sooner  I will f/u on Psych referral and we will be back in touch     Diabetes Mellitus and Nutrition, Adult When you have diabetes, or diabetes mellitus, it is very important to have healthy eating habits because your blood sugar (glucose) levels are greatly affected by what you eat and drink. Eating healthy foods in the right amounts, at about the same times every day, can help you:  Control your blood glucose.  Lower your risk of heart disease.  Improve your blood pressure.  Reach or maintain a healthy weight. What can affect my meal plan? Every person with diabetes is different, and each person has different needs for a meal plan. Your health care provider may recommend that you work with a dietitian to make a meal plan that is best for you. Your meal plan may vary depending on factors such as:  The calories you need.  The medicines you take.  Your weight.  Your blood glucose, blood pressure, and cholesterol levels.  Your activity level.  Other health conditions you have, such as heart or kidney disease. How do carbohydrates affect me? Carbohydrates, also called carbs, affect your blood glucose level more than any other type of food. Eating carbs naturally raises the amount of glucose in your blood. Carb counting is a method for keeping track of how many carbs you eat. Counting carbs is important to keep your blood glucose at a healthy level, especially if you use insulin or take certain oral diabetes medicines. It is important to know how many carbs you can safely have in each meal. This is different for every person. Your dietitian can help you calculate how many carbs you should  have at each meal and for each snack. How does alcohol affect me? Alcohol can cause a sudden decrease in blood glucose (hypoglycemia), especially if you use insulin or take certain oral diabetes medicines. Hypoglycemia can be a life-threatening condition. Symptoms of hypoglycemia, such as sleepiness, dizziness, and confusion, are similar to symptoms of having too much alcohol.  Do not drink alcohol if: ? Your health care provider tells you not to drink. ? You are pregnant, may be pregnant, or are planning to become pregnant.  If you drink alcohol: ? Do not drink on an empty stomach. ? Limit how much you use to:  0-1 drink a day for women.  0-2 drinks a day for men. ? Be aware of how much alcohol is in your drink. In the [place] one drink equals one 12 oz bottle of beer (355 mL), one 5 oz glass of wine (148 mL), or one 1 oz glass of hard liquor (44 mL). ? Keep yourself hydrated with water, diet soda, or unsweetened iced tea.  Keep in mind that regular soda, juice, and other mixers may contain a lot of sugar and must be counted as carbs. What are tips for following this plan? Reading food labels  Start by checking the serving size on the "Nutrition Facts" label of packaged foods and drinks. The amount of calories, carbs, fats, and other nutrients listed on the label is based on one serving of the item.  Many items contain more than one serving per package.  Check the total grams (g) of carbs in one serving. You can calculate the number of servings of carbs in one serving by dividing the total carbs by 15. For example, if a food has 30 g of total carbs per serving, it would be equal to 2 servings of carbs.  Check the number of grams (g) of saturated fats and trans fats in one serving. Choose foods that have a low amount or none of these fats.  Check the number of milligrams (mg) of salt (sodium) in one serving. Most people should limit total sodium intake to less than 2,300 mg per  day.  Always check the nutrition information of foods labeled as "low-fat" or "nonfat." These foods may be higher in added sugar or refined carbs and should be avoided.  Talk to your dietitian to identify your daily goals for nutrients listed on the label. Shopping  Avoid buying canned, pre-made, or processed foods. These foods tend to be high in fat, sodium, and added sugar.  Shop around the outside edge of the grocery store. This is where you will most often find fresh fruits and vegetables, bulk grains, fresh meats, and fresh dairy. Cooking  Use low-heat cooking methods, such as baking, instead of high-heat cooking methods like deep frying.  Cook using healthy oils, such as olive, canola, or sunflower oil.  Avoid cooking with butter, cream, or high-fat meats. Meal planning  Eat meals and snacks regularly, preferably at the same times every day. Avoid going long periods of time without eating.  Eat foods that are high in fiber, such as fresh fruits, vegetables, beans, and whole grains. Talk with your dietitian about how many servings of carbs you can eat at each meal.  Eat 4-6 oz (112-168 g) of lean protein each day, such as lean meat, chicken, fish, eggs, or tofu. One ounce (oz) of lean protein is equal to: ? 1 oz (28 g) of meat, chicken, or fish. ? 1 egg. ?  cup (62 g) of tofu.  Eat some foods each day that contain healthy fats, such as avocado, nuts, seeds, and fish.   What foods should I eat? Fruits Berries. Apples. Oranges. Peaches. Apricots. Plums. Grapes. Mango. Papaya. Pomegranate. Kiwi. Cherries. Vegetables Lettuce. Spinach. Leafy greens, including kale, chard, collard greens, and mustard greens. Beets. Cauliflower. Cabbage. Broccoli. Carrots. Green beans. Tomatoes. Peppers. Onions. Cucumbers. Brussels sprouts. Grains Whole grains, such as whole-wheat or whole-grain bread, crackers, tortillas, cereal, and pasta. Unsweetened oatmeal. Quinoa. Brown or wild rice. Meats  and other proteins Seafood. Poultry without skin. Lean cuts of poultry and beef. Tofu. Nuts. Seeds. Dairy Low-fat or fat-free dairy products such as milk, yogurt, and cheese. The items listed above may not be a complete list of foods and beverages you can eat. Contact a dietitian for more information. What foods should I avoid? Fruits Fruits canned with syrup. Vegetables Canned vegetables. Frozen vegetables with butter or cream sauce. Grains Refined white flour and flour products such as bread, pasta, snack foods, and cereals. Avoid all processed foods. Meats and other proteins Fatty cuts of meat. Poultry with skin. Breaded or fried meats. Processed meat. Avoid saturated fats. Dairy Full-fat yogurt, cheese, or milk. Beverages Sweetened drinks, such as soda or iced tea. The items listed above may not be a complete list of foods and beverages you should avoid. Contact a dietitian for more information. Questions to ask a health care provider  Do I need to  meet with a diabetes educator?  Do I need to meet with a dietitian?  What number can I call if I have questions?  When are the best times to check my blood glucose? Where to find more information:  American Diabetes Association: diabetes.org  Academy of Nutrition and Dietetics: www.eatright.[employer] of Diabetes and Digestive and Kidney Diseases: [employer]  Association of Diabetes Care and Education Specialists: www.diabeteseducator.org Summary  It is important to have healthy eating habits because your blood sugar (glucose) levels are greatly affected by what you eat and drink.  A healthy meal plan will help you control your blood glucose and maintain a healthy lifestyle.  Your health care provider may recommend that you work with a dietitian to make a meal plan that is best for you.  Keep in mind that carbohydrates (carbs) and alcohol have immediate effects on your blood glucose levels. It is  important to count carbs and to use alcohol carefully. This information is not intended to replace advice given to you by your health care provider. Make sure you discuss any questions you have with your health care provider. Document Revised: 03/25/2019 Document Reviewed: 03/25/2019 Elsevier Patient Education  2021 [place].  Diabetes Mellitus and Exercise Exercising regularly is important for overall health, especially for people who have diabetes mellitus. Exercising is not only about losing weight. It has many other health benefits, such as increasing muscle strength and bone density and reducing body fat and stress. This leads to improved fitness, flexibility, and endurance, all of which result in better overall health. What are the benefits of exercise if I have diabetes? Exercise has many benefits for people with diabetes. They include:  Helping to lower and control blood sugar (glucose).  Helping the body to respond better to the hormone insulin by improving insulin sensitivity.  Reducing how much insulin the body needs.  Lowering the risk for heart disease by: ? Lowering "bad" cholesterol and triglyceride levels. ? Increasing "good" cholesterol levels. ? Lowering blood pressure. ? Lowering blood glucose levels. What is my activity plan? Your health care provider or certified diabetes educator can help you make a plan for the type and frequency of exercise that works for you. This is called your activity plan. Be sure to:  Get at least 150 minutes of medium-intensity or high-intensity exercise each week. Exercises may include brisk walking, biking, or water aerobics.  Do stretching and strengthening exercises, such as yoga or weight lifting, at least 2 times a week.  Spread out your activity over at least 3 days of the week.  Get some form of physical activity each day. ? Do not go more than 2 days in a row without some kind of physical activity. ? Avoid being inactive  for more than 90 minutes at a time. Take frequent breaks to walk or stretch.  Choose exercises or activities that you enjoy. Set realistic goals.  Start slowly and gradually increase your exercise intensity over time.   How do I manage my diabetes during exercise? Monitor your blood glucose  Check your blood glucose before and after exercising. If your blood glucose is: ? 240 mg/dL (13.3 mmol/L) or higher before you exercise, check your urine for ketones. These are chemicals created by the liver. If you have ketones in your urine, do not exercise until your blood glucose returns to normal. ? 100 mg/dL (5.6 mmol/L) or lower, eat a snack containing 15-20 grams of carbohydrate. Check your blood glucose 15 minutes after  the snack to make sure that your glucose level is above 100 mg/dL (5.6 mmol/L) before you start your exercise.  Know the symptoms of low blood glucose (hypoglycemia) and how to treat it. Your risk for hypoglycemia increases during and after exercise. Follow these tips and your health care provider's instructions  Keep a carbohydrate snack that is fast-acting for use before, during, and after exercise to help prevent or treat hypoglycemia.  Avoid injecting insulin into areas of the body that are going to be exercised. For example, avoid injecting insulin into: ? Your arms, when you are about to play tennis. ? Your legs, when you are about to go jogging.  Keep records of your exercise habits. Doing this can help you and your health care provider adjust your diabetes management plan as needed. Write down: ? Food that you eat before and after you exercise. ? Blood glucose levels before and after you exercise. ? The type and amount of exercise you have done.  Work with your health care provider when you start a new exercise or activity. He or she may need to: ? Make sure that the activity is safe for you. ? Adjust your insulin, other medicines, and food that you eat.  Drink  plenty of water while you exercise. This prevents loss of water (dehydration) and problems caused by a lot of heat in the body (heat stroke).   Where to find more information  American Diabetes Association: www.diabetes.org Summary  Exercising regularly is important for overall health, especially for people who have diabetes mellitus.  Exercising has many health benefits. It increases muscle strength and bone density and reduces body fat and stress. It also lowers and controls blood glucose.  Your health care provider or certified diabetes educator can help you make an activity plan for the type and frequency of exercise that works for you.  Work with your health care provider to make sure any new activity is safe for you. Also work with your health care provider to adjust your insulin, other medicines, and the food you eat. This information is not intended to replace advice given to you by your health care provider. Make sure you discuss any questions you have with your health care provider. Document Revised: 01/13/2019 Document Reviewed: 01/13/2019 Elsevier Patient Education  [place].
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
## 2020-06-06 ENCOUNTER — Encounter: Payer: Self-pay | Admitting: Family Medicine

## 2020-06-06 MED ORDER — ZOLPIDEM TARTRATE 10 MG PO TABS: 10.0000 mg | ORAL_TABLET | Freq: Every day | ORAL | 5 refills | Status: DC

## 2020-06-06 MED ORDER — OXYCODONE HCL ER 40 MG PO T12A: EXTENDED_RELEASE_TABLET | ORAL | 0 refills | Status: DC

## 2020-06-06 MED ORDER — OXYCODONE HCL ER 40 MG PO T12A: EXTENDED_RELEASE_TABLET | ORAL | 0 refills | Status: AC

## 2020-06-06 NOTE — Assessment & Plan Note (Signed)
  Patient re-educated about  the importance of commitment to a  minimum of 150 minutes of exercise per week as able.  The importance of healthy food choices with portion control discussed, as well as eating regularly and within a 12 hour window most days. The need to choose "clean , green" food 50 to 75% of the time is discussed, as well as to make water the primary drink and set a goal of 64 ounces water daily.    Weight /BMI 06/03/2020 05/06/2020 04/15/2020  WEIGHT 310 lb 321 lb 313 lb  HEIGHT 5\' 4"  5\' 4"  5\' 4"   BMI 53.21 kg/m2 55.1 kg/m2 53.73 kg/m2

## 2020-06-06 NOTE — Assessment & Plan Note (Signed)
Uncontrolled, need in office eval DASH diet and commitment to daily physical activity for a minimum of 30 minutes discussed and encouraged, as a part of hypertension management. The importance of attaining a healthy weight is also discussed.  BP/Weight 06/03/2020 05/06/2020 04/15/2020 04/12/2020 03/22/2020 66/09/3014 0/05/930  Systolic BP 355 732 202 - 542 706 237  Diastolic BP 94 81 91 - 91 78 90  Wt. (Lbs) 310 321 313 326 317 302 294  BMI 53.21 55.1 53.73 55.96 54.41 51.84 50.46

## 2020-06-06 NOTE — Assessment & Plan Note (Signed)
The patient's Controlled Substance registry is reviewed and compliance confirmed. Adequacy of  Pain control and level of function is assessed. Medication dosing is adjusted as deemed appropriate. Twelve weeks of medication is prescribed , with a follow up appointment between 11 to 12 weeks .  

## 2020-06-06 NOTE — Assessment & Plan Note (Signed)
Controlled, no change in medication  

## 2020-06-11 ENCOUNTER — Other Ambulatory Visit: Payer: Self-pay

## 2020-06-11 MED ORDER — UNABLE TO FIND: 0 refills | Status: DC

## 2020-06-14 ENCOUNTER — Other Ambulatory Visit: Payer: Self-pay | Admitting: Family Medicine

## 2020-06-14 DIAGNOSIS — L400 Psoriasis vulgaris: Secondary | ICD-10-CM | POA: Diagnosis not present

## 2020-06-14 DIAGNOSIS — I83893 Varicose veins of bilateral lower extremities with other complications: Secondary | ICD-10-CM | POA: Diagnosis not present

## 2020-06-17 ENCOUNTER — Other Ambulatory Visit: Payer: Self-pay | Admitting: Family Medicine

## 2020-06-17 ENCOUNTER — Other Ambulatory Visit: Payer: Self-pay | Admitting: Pulmonary Disease

## 2020-06-17 ENCOUNTER — Other Ambulatory Visit: Payer: Self-pay | Admitting: Nurse Practitioner

## 2020-06-17 ENCOUNTER — Other Ambulatory Visit: Payer: Self-pay | Admitting: "Endocrinology

## 2020-06-17 DIAGNOSIS — M62830 Muscle spasm of back: Secondary | ICD-10-CM

## 2020-06-21 DIAGNOSIS — E039 Hypothyroidism, unspecified: Secondary | ICD-10-CM | POA: Diagnosis not present

## 2020-06-22 ENCOUNTER — Other Ambulatory Visit: Payer: Self-pay

## 2020-06-22 ENCOUNTER — Ambulatory Visit (INDEPENDENT_AMBULATORY_CARE_PROVIDER_SITE_OTHER): Payer: Medicare Other | Admitting: Nurse Practitioner

## 2020-06-22 ENCOUNTER — Encounter: Payer: Self-pay | Admitting: Nurse Practitioner

## 2020-06-22 VITALS — BP 115/73 | HR 72 | Ht 64.0 in | Wt 327.0 lb

## 2020-06-22 DIAGNOSIS — E782 Mixed hyperlipidemia: Secondary | ICD-10-CM | POA: Diagnosis not present

## 2020-06-22 DIAGNOSIS — I10 Essential (primary) hypertension: Secondary | ICD-10-CM

## 2020-06-22 DIAGNOSIS — E039 Hypothyroidism, unspecified: Secondary | ICD-10-CM

## 2020-06-22 DIAGNOSIS — E1165 Type 2 diabetes mellitus with hyperglycemia: Secondary | ICD-10-CM

## 2020-06-22 LAB — TSH: TSH: 3.49 u[IU]/mL (ref 0.450–4.500)

## 2020-06-22 LAB — POCT GLYCOSYLATED HEMOGLOBIN (HGB A1C): HbA1c, POC (controlled diabetic range): 9.1 % — AB (ref 0.0–7.0)

## 2020-06-22 LAB — T4, FREE: Free T4: 1.23 ng/dL (ref 0.82–1.77)

## 2020-06-22 MED ORDER — INSULIN LISPRO PROT & LISPRO (75-25 MIX) 100 UNIT/ML ~~LOC~~ SUSP: 65.0000 [IU] | Freq: Two times a day (BID) | SUBCUTANEOUS | 3 refills | Status: DC

## 2020-06-22 NOTE — Progress Notes (Signed)
06/22/2020, 10:07 AM                    Endocrinology follow-up note   Subjective:    [name], female    DOB: [birth year].  [name] is being seen in follow-up in the management of currently uncontrolled symptomatic type 2 diabetes.  She also has comorbid dyslipidemia, hypertension.   PMD:  Fayrene Helper, MD.   Past Medical History:  Diagnosis Date  . Allergy    Phreesia 06/02/2020  . Anxiety   . Arthritis   . Asthma   . Asthma    Phreesia 04/12/2020  . CHF (congestive heart failure) ([place])    Phreesia 04/12/2020  . Coronary atherosclerosis of native coronary artery    Mild nonobstructive CAD by cardiac catheterization 2008 - Dr. Terrence Dupont  . Degenerative disc disease   . Depression   . Depression    Phreesia 04/12/2020  . Diabetes mellitus without complication ([place])    Phreesia 04/12/2020  . Dysrhythmia   . Essential hypertension, benign   . Folliculitis 05/02/5850  . GERD (gastroesophageal reflux disease)   . H/O hiatal hernia   . Helicobacter pylori gastritis 2008  . Hypercholesterolemia   . Hypertension   . Hyperthyroidism    s/p radiation  . Left-sided epistaxis 05/15/2018  . Low back pain   . Migraine   . Nephrolithiasis    Recurring episodes since 2004  . Neuromuscular disorder ([place])    Phreesia 04/12/2020  . Severe obesity (BMI >= 40) (H[facility])   . Sleep apnea    STOP BANG SCORE 6no cpap used  . Thyroid disease    Phreesia 04/12/2020  . Type 2 diabetes mellitus with diabetic neuropathy [facility]     Past Surgical History:  Procedure Laterality Date  . ABDOMINAL HYSTERECTOMY    . BACK SURGERY  1993  . BACK SURGERY  1994  . BACK SURGERY  2002  . BACK SURGERY  2011   [place] Left   . CHOLECYSTECTOMY  1996  . COLONOSCOPY  2000 [facility]   INT HEMORRHOIDS/FISSURE  . COLONOSCOPY  2003 [facility], CHANGE IN BOWEL HABITS   INT HEMORRHOIDS  . COLONOSCOPY  2006 [facility]   INT HEMORRHOIDS  .  COLONOSCOPY  2007 [facility]   INT HEMORRHOIDS  . CYSTOSCOPY WITH STENT PLACEMENT Left 07/25/2012   Procedure: CYSTOSCOPY, left retograde pyelogram WITH left ureteral  STENT PLACEMENT;  Surgeon: Claybon Jabs, MD;  Location: [facility];  Service: Urology;  Laterality: Left;  . CYSTOSCOPY/RETROGRADE/URETEROSCOPY  12/04/2011   Procedure: CYSTOSCOPY/RETROGRADE/URETEROSCOPY;  Surgeon: Molli Hazard, MD;  Location: [facility];  Service: Urology;  Laterality: Right;  . CYSTOSCOPY/RETROGRADE/URETEROSCOPY/STONE EXTRACTION WITH BASKET Left 09/03/2012   Procedure: CYSTOSCOPY/RETROGRADE pyelogram/digital URETEROSCOPY/STONE EXTRACTION WITH BASKET, left stent removal;  Surgeon: Molli Hazard, MD;  Location: [facility];  Service: Urology;  Laterality: Left;  . ESOPHAGOGASTRODUODENOSCOPY  2008   DPO:EUMPN hiatal hernia./Normal esophagus without evidence of Barrett's mass, stricture, erosion or ulceration./Normal duodenal bulb and second portion of the duodenum./Diffuse erythema in the body and the antrum with occasional erosion.  Biopsies obtained via cold forceps to evaluate for H. pylori gastritis  . FRACTURE SURGERY  1999   right clavicle  . HOLMIUM LASER APPLICATION Left 07/04/1441   Procedure: HOLMIUM LASER APPLICATION;  Surgeon: Molli Hazard, MD;  Location: [facility];  Service: Urology;  Laterality: Left;  . KNEE ARTHROSCOPY  10/04/2010,  right knee arthroscopy, dr Theda Sers  . LEFT HEART CATH AND CORONARY ANGIOGRAPHY N/A 05/08/2018   Procedure: LEFT HEART CATH AND CORONARY ANGIOGRAPHY;  Surgeon: Troy Sine, MD;  Location: [place] [facility];  Service: Cardiovascular;  Laterality: N/A;  . left knee arthroscopic surgery  1999  . Left salphingectomy secondary to ectopic pregnancy  1991  . PARTIAL HYSTERECTOMY  1991  . RIGHT HEART CATH N/A 07/07/2019   Procedure: RIGHT HEART CATH;  Surgeon: Leonie Man, MD;  Location: [place] [facility];  Service: Cardiovascular;  Laterality: N/A;  . rotary cuff   Right 05/14/2014   [facility]  . SPINE SURGERY N/A    Phreesia 06/02/2020  . UPPER GASTROINTESTINAL ENDOSCOPY  2008 abd pain   H. pylori gastritis  . UPPER GASTROINTESTINAL ENDOSCOPY  1996 [place] [place]   PUD    Social History   Socioeconomic History  . Marital status: Single    Spouse name: Not on file  . Number of children: 1  . Years of education: 73  . Highest education level: 12th grade  Occupational History  . Occupation: Disable   Tobacco Use  . Smoking status: Never Smoker  . Smokeless tobacco: Never Used  Vaping Use  . Vaping Use: Never used  Substance and Sexual Activity  . Alcohol use: No  . Drug use: No  . Sexual activity: Not Currently    Partners: Male    Birth control/protection: Surgical    Comment: hyst  Other Topics Concern  . Not on file  Social History Narrative   Lives alone    Social Determinants of Health   Financial Resource Strain: Low Risk   . Difficulty of Paying Living Expenses: Not hard at all  Food Insecurity: No Food Insecurity  . Worried About Charity fundraiser in the Last Year: Never true  . Ran Out of Food in the Last Year: Never true  Transportation Needs: No Transportation Needs  . Lack of Transportation (Medical): No  . Lack of Transportation (Non-Medical): No  Physical Activity: Sufficiently Active  . Days of Exercise per Week: 3 days  . Minutes of Exercise per Session: 60 min  Stress: No Stress Concern Present  . Feeling of Stress : Not at all  Social Connections: Socially Isolated  . Frequency of Communication with Friends and Family: More than three times a week  . Frequency of Social Gatherings with Friends and Family: More than three times a week  . Attends Religious Services: Never  . Active Member of Clubs or Organizations: No  . Attends Archivist Meetings: Never  . Marital Status: Divorced    Family History  Problem Relation Age of Onset  . Heart disease Mother   . Hypertension Mother   . Cancer  Mother        Cervical   . Asthma Mother   . Heart attack Father   . Cancer Father        Prostate  . Colon cancer Father        DECEASED AGE 60  . Colon polyps Neg Hx     Outpatient Encounter Medications as of 06/22/2020  Medication Sig  . albuterol (VENTOLIN HFA) 108 (90 Base) MCG/ACT inhaler Inhale 2 puffs into the lungs every 6 (six) hours as needed for wheezing or shortness of breath.  Marland Kitchen amLODipine (NORVASC) 10 MG tablet TAKE 1 TABLET BY MOUTH EVERY DAY  . B-D UF III MINI PEN NEEDLES 31G X 5 MM MISC  USE ONCE DAILY AS DIRECTED  . betamethasone, augmented, (DIPROLENE) 0.05 % lotion Apply 1 application topically 2 (two) times daily.  . blood glucose meter kit and supplies Dispense based on patient and insurance preference. Once daily testing dx. e11.9  . calcipotriene (DOVONOX) 0.005 % ointment Apply 1 application topically daily.   . clobetasol cream (TEMOVATE) 1.30 % Apply 1 application topically 2 (two) times daily.  . cloNIDine (CATAPRES - DOSED IN MG/24 HR) 0.3 mg/24hr patch APPLY 1 PATCH TO SKIN ONCE A WEEK  . cloNIDine (CATAPRES) 0.3 MG tablet TAKE 1 TABLET (0.3 MG TOTAL) BY MOUTH AT BEDTIME.  . cyclobenzaprine (FLEXERIL) 10 MG tablet TAKE 1 TABLET BY MOUTH THREE TIMES A DAY  . diazepam (VALIUM) 5 MG tablet Take one tablet by mouth three times daily for back spasm  . DULoxetine (CYMBALTA) 60 MG capsule TAKE 1 CAPSULE BY MOUTH TWICE A DAY  . ergocalciferol (VITAMIN D2) 1.25 MG (50000 UT) capsule Take 1 capsule (50,000 Units total) by mouth once a week. One capsule once weekly  . esomeprazole (NEXIUM) 40 MG capsule TAKE 1 CAPSULE BY MOUTH EVERY DAY  . FLUoxetine (PROZAC) 20 MG capsule Take by mouth.  . fluticasone (FLONASE) 50 MCG/ACT nasal spray Place 2 sprays into both nostrils daily.  . furosemide (LASIX) 40 MG tablet TAKE 1 TABLET BY MOUTH EVERY DAY  . gabapentin (NEURONTIN) 800 MG tablet TAKE 1 TABLET BY MOUTH FOUR TIMES A DAY  . glipiZIDE (GLUCOTROL XL) 10 MG 24 hr tablet  TAKE 1 TABLET BY MOUTH EVERY DAY WITH BREAKFAST  . hydrOXYzine (ATARAX/VISTARIL) 10 MG tablet Take 10 mg by mouth in the morning, at noon, and at bedtime.  Marland Kitchen ipratropium-albuterol (DUONEB) 0.5-2.5 (3) MG/3ML SOLN Take 3 mLs by nebulization every 6 (six) hours as needed.  Marland Kitchen KLOR-CON M20 20 MEQ tablet TAKE 1 TABLET BY MOUTH TWICE A DAY  . Lancets (ONETOUCH DELICA PLUS QMVHQI69G) MISC TEST ONCE DAILY  . levothyroxine (SYNTHROID) 75 MCG tablet TAKE 1 TABLET BY MOUTH EVERY DAY  . loratadine (CLARITIN) 10 MG tablet Take 1 tablet (10 mg total) by mouth daily.  . Magnesium Oxide 400 MG CAPS Take one  . meclizine (ANTIVERT) 25 MG tablet Take 25 mg by mouth in the morning, at noon, and at bedtime.  . metFORMIN (GLUCOPHAGE) 500 MG tablet TAKE 1 TABLET (500 MG TOTAL) BY MOUTH 2 (TWO) TIMES DAILY WITH A MEAL.  . metoprolol tartrate (LOPRESSOR) 25 MG tablet TAKE 1 TABLET BY MOUTH TWICE A DAY  . nitroGLYCERIN (NITROSTAT) 0.4 MG SL tablet Place 1 tablet (0.4 mg total) under the tongue every 5 (five) minutes as needed for chest pain.  Marland Kitchen ondansetron (ZOFRAN) 4 MG tablet Take 1 tablet (4 mg total) by mouth every 8 (eight) hours as needed for nausea or vomiting.  Glory Rosebush VERIO test strip USE AS INSTRUCTED FOUR TIMES DAILY TESTING DX E11.65  . oxyCODONE (OXYCONTIN) 40 mg 12 hr tablet Take one tablet by mouth two times daily for chronic back pain  . rosuvastatin (CRESTOR) 5 MG tablet Take one tablet by mouth three times weekly, on Monday, Wednesday and Friday  . SYMBICORT 160-4.5 MCG/ACT inhaler TAKE 2 PUFFS BY MOUTH TWICE A DAY  . zolpidem (AMBIEN) 10 MG tablet Take 1 tablet (10 mg total) by mouth at bedtime.  . [DISCONTINUED] insulin lispro protamine-lispro (HUMALOG 75/25 MIX) (75-25) 100 UNIT/ML SUSP injection Inject 65 Units into the skin 2 (two) times daily with a meal.  . [DISCONTINUED] UNABLE  TO FIND Replacement tips for quad cane. Package of 4  . insulin lispro protamine-lispro (HUMALOG 75/25 MIX) (75-25)  100 UNIT/ML SUSP injection Inject 65 Units into the skin 2 (two) times daily with a meal.  . [DISCONTINUED] oxyCODONE (OXYCONTIN) 40 mg 12 hr tablet Take one tablet by mouth two times daily for chronic back pain  . [DISCONTINUED] oxyCODONE (OXYCONTIN) 40 mg 12 hr tablet Take one tablet by mouth two times daily for back pain  . [DISCONTINUED] UNABLE TO FIND Glucerna 114 U/M or PRN Chux 90/M or PRN Incontinence pads 198/M or PRN  Tub Mat x 1  Grab bars  . [DISCONTINUED] UNABLE TO FIND Reacher/Grabber x 1  Lift chair x 1  Lifeline X1  . [DISCONTINUED] UNABLE TO FIND SHARPS CONTAINER  1/Y BED SUPPORT RAIL    1/Y  . [DISCONTINUED] UNABLE TO FIND Nutritional supplement 225U/M or PRN  Incontinence Pads 198/M or PRN  UnderpadsChux 90/M Nonsterile gloves 1 B/M or PRN  Gait belt 1/Y or PRN   No facility-administered encounter medications on file as of 06/22/2020.    ALLERGIES: Allergies  Allergen Reactions  . Latex Itching and Rash  . Morphine Other (See Comments)    Reaction with esophagus, unable to swallow.   . Morphine And Related Shortness Of Breath and Swelling  . Other Itching, Shortness Of Breath and Swelling  . Ace Inhibitors Cough  . Aspirin Other (See Comments)    Reaction with esophagus, unable to swallow.   . Losartan Cough  . Tapentadol Other (See Comments)    Nausea, increased sleepiness, h/a  . Tomato Other (See Comments)    Acid reflux due to acid in tomato  . Adhesive [Tape] Rash    VACCINATION STATUS: Immunization History  Administered Date(s) Administered  . Influenza Split 03/30/2011  . Influenza Whole 03/21/2005, 02/09/2006  . Influenza,inj,Quad PF,6+ Mos 02/11/2014, 07/27/2015, 03/09/2016, 01/29/2017, 02/07/2018, 01/30/2019, 02/11/2020  . Influenza-Unspecified 01/29/2017  . PFIZER(Purple Top)SARS-COV-2 Vaccination 07/22/2019, 08/12/2019, 03/05/2020  . Pneumococcal Polysaccharide-23 04/17/2006, 09/14/2015  . Td 04/17/2006    Diabetes She presents for  her follow-up diabetic visit. She has type 2 diabetes mellitus. Her disease course has been improving. There are no hypoglycemic associated symptoms. Pertinent negatives for hypoglycemia include no confusion, headaches, pallor or seizures. Associated symptoms include fatigue, polydipsia and polyuria. Pertinent negatives for diabetes include no blurred vision, no chest pain and no polyphagia. There are no hypoglycemic complications. Symptoms are improving. Diabetic complications include heart disease, nephropathy and peripheral neuropathy. Risk factors for coronary artery disease include dyslipidemia, diabetes mellitus, family history, hypertension, obesity, sedentary lifestyle, tobacco exposure and post-menopausal. Current diabetic treatment includes insulin injections and oral agent (dual therapy). She is compliant with treatment most of the time. Her weight is increasing rapidly. She is following a generally unhealthy (Attempting to make better changes in her diet) diet. When asked about meal planning, she reported none. She has had a previous visit with a dietitian. She never participates in exercise. Her home blood glucose trend is decreasing steadily. Her breakfast blood glucose range is generally 140-180 mg/dl. Her bedtime blood glucose range is generally 140-180 mg/dl. (She presents today with her meter and logs showing improved, yet still above target fasting and postprandial glycemic profile.  Her POCT A1c today is 9.1%, improving greatly from previous visit of 12.1%.  She denies any hypoglycemia.  She has only been taking 55 units of her 75/25 insulin twice daily instead of the 65 recommended at last visit.) An ACE inhibitor/angiotensin II  receptor blocker is contraindicated. She does not see a podiatrist.Eye exam is not current.  Hyperlipidemia This is a chronic problem. The current episode started more than 1 year ago. The problem is uncontrolled. Recent lipid tests were reviewed and are high.  Exacerbating diseases include chronic renal disease, diabetes, hypothyroidism and obesity. Factors aggravating her hyperlipidemia include beta blockers. Pertinent negatives include no chest pain, myalgias or shortness of breath. Current antihyperlipidemic treatment includes statins. The current treatment provides mild improvement of lipids. Compliance problems include adherence to diet and adherence to exercise.  Risk factors for coronary artery disease include diabetes mellitus, dyslipidemia, hypertension, obesity and a sedentary lifestyle.  Hypertension This is a chronic problem. The current episode started more than 1 year ago. The problem has been gradually improving since onset. The problem is controlled. Associated symptoms include peripheral edema. Pertinent negatives include no blurred vision, chest pain, headaches, palpitations or shortness of breath. Agents associated with hypertension include thyroid hormones. Risk factors for coronary artery disease include family history, dyslipidemia, diabetes mellitus, obesity, sedentary lifestyle and post-menopausal state. Past treatments include calcium channel blockers, diuretics and beta blockers. The current treatment provides moderate improvement. Compliance problems include diet and exercise.  Hypertensive end-organ damage includes CAD/MI and heart failure. Identifiable causes of hypertension include chronic renal disease and sleep apnea.    Review of systems  Constitutional: + dramatically increasing body weight (gained 17 lbs since beginning of Feb),  current Body mass index is 56.13 kg/m. , no fatigue, no subjective hyperthermia, no subjective hypothermia Eyes: no blurry vision, no xerophthalmia ENT: no sore throat, no nodules palpated in throat, no dysphagia/odynophagia, no hoarseness Cardiovascular: no chest pain, no shortness of breath, no palpitations, no leg swelling Respiratory: no cough, no shortness of breath Gastrointestinal: no  nausea/vomiting/diarrhea Musculoskeletal: no muscle/joint aches, walks with cane d/t hx of falls Skin: no rashes, no hyperemia Neurological: no tremors, no numbness, no tingling, no dizziness Psychiatric: no depression, no anxiety    Objective:    BP 115/73 (BP Location: Left Arm, Patient Position: Sitting)   Pulse 72   Ht $R'5\' 4"'Ij$  (1.626 m)   Wt (!) 327 lb (148.3 kg)   BMI 56.13 kg/m   Wt Readings from Last 3 Encounters:  06/22/20 (!) 327 lb (148.3 kg)  06/03/20 (!) 310 lb (140.6 kg)  05/06/20 (!) 321 lb (145.6 kg)    BP Readings from Last 3 Encounters:  06/22/20 115/73  06/03/20 (!) 154/94  05/06/20 (!) 161/81    Physical Exam- Limited  Constitutional:  Body mass index is 56.13 kg/m. , not in acute distress, normal state of mind Eyes:  EOMI, no exophthalmos Neck: Supple Cardiovascular: RRR, no murmers, rubs, or gallops, no edema Respiratory: Adequate breathing efforts, no crackles, rales, rhonchi, or wheezing Musculoskeletal: no gross deformities, strength intact in all four extremities, no gross restriction of joint movements, walks with cane Skin:  no rashes, no hyperemia Neurological: no tremor with outstretched hands   CMP ( most recent) CMP     Component Value Date/Time   NA 141 05/06/2020 1201   K 4.4 05/06/2020 1201   CL 100 05/06/2020 1201   CO2 25 05/06/2020 1201   GLUCOSE 76 05/06/2020 1201   GLUCOSE 316 (H) 02/11/2020 1111   BUN 17 05/06/2020 1201   CREATININE 0.90 05/06/2020 1201   CREATININE 0.91 02/11/2020 1111   CALCIUM 9.9 05/06/2020 1201   PROT 8.3 05/06/2020 1201   ALBUMIN 4.3 05/06/2020 1201   AST 30 05/06/2020 1201  ALT 27 05/06/2020 1201   ALKPHOS 102 05/06/2020 1201   BILITOT 0.2 05/06/2020 1201   GFRNONAA 70 05/06/2020 1201   GFRNONAA 69 02/11/2020 1111   GFRAA 80 05/06/2020 1201   GFRAA 80 02/11/2020 1111    Diabetic Labs (most recent): Lab Results  Component Value Date   HGBA1C 9.1 (A) 06/22/2020   HGBA1C 12.1 (A)  03/22/2020   HGBA1C 11.4 (A) 11/20/2019     Lipid Panel ( most recent) Lipid Panel     Component Value Date/Time   CHOL 197 05/06/2020 1201   TRIG 164 (H) 05/06/2020 1201   HDL 44 05/06/2020 1201   CHOLHDL 4.5 (H) 05/06/2020 1201   CHOLHDL 4.0 11/10/2019 1303   VLDL 16 05/06/2018 0335   LDLCALC 124 (H) 05/06/2020 1201   LDLCALC 97 11/10/2019 1303      Assessment & Plan:   1) Uncontrolled type 2 diabetes mellitus with hyperglycemia ([place])  - Angela [name] has currently uncontrolled symptomatic type 2 DM since  60 years of age.  She presents today with her meter and logs showing improved, yet still above target fasting and postprandial glycemic profile.  Her POCT A1c today is 9.1%, improving greatly from previous visit of 12.1%.  She denies any hypoglycemia.  She has only been taking 55 units of her 75/25 insulin twice daily instead of the 65 recommended at last visit.   Recent labs reviewed.  -her diabetes is complicated by coronary artery disease, CHF, obesity/sedentary life and she remains at a high risk for more acute and chronic complications which include CAD, CVA, CKD, retinopathy, and neuropathy. These are all discussed in detail with her.  - Nutritional counseling repeated at each appointment due to patients tendency to fall back in to old habits.  - The patient admits there is a room for improvement in their diet and drink choices. -  Suggestion is made for the patient to avoid simple carbohydrates from their diet including Cakes, Sweet Desserts / Pastries, Ice Cream, Soda (diet and regular), Sweet Tea, Candies, Chips, Cookies, Sweet Pastries,  Store Bought Juices, Alcohol in Excess of  1-2 drinks a day, Artificial Sweeteners, Coffee Creamer, and "Sugar-free" Products. This will help patient to have stable blood glucose profile and potentially avoid unintended weight gain.   - I encouraged the patient to switch to  unprocessed or minimally processed complex starch and  increased protein intake (animal or plant source), fruits, and vegetables.   - Patient is advised to stick to a routine mealtimes to eat 3 meals  a day and avoid unnecessary snacks ( to snack only to correct hypoglycemia).  - she has previously been scheduled with Jearld Fenton, RDN, CDE for individualized diabetes education.  - I have approached her with the following individualized plan to manage diabetes and patient agrees:   -Given her presentation with chronic glycemic burden, she will likely require multiple daily injections of insulin in order for her to achieve control of diabetes to target.  -She will tolerate increase in her Humalog 75/25 to 65 units with breakfast and supper if glucose is above 90 and she is eating.  She is also advised to continue Metformin 500 mg p.o. twice daily with meals and Glipizide 10 mg XL daily with breakfast.  -She is encouraged to consistently monitor glucose 4 times daily, before meals and before bed, and to call the clinic if she has readings less than 70 or greater than 300 for 3 tests in a row.  - she  is warned not to take insulin without proper monitoring per orders.  2) Blood Pressure /Hypertension: Her blood pressure is controlled to target.  She is advised to continue Norvasc 10 mg po daily, Clonidine 0.3 mg patch once weekly, Clonidine 0.3 mg po daily at bedtime, Lasix 40 mg po daily, and Metoprolol 25 mg po twice daily.  3) Lipids/Hyperlipidemia:  Her most recent lipid panel from 05/06/20 shows uncontrolled LDL at 124 (worsening) and elevated triglycerides of 164 (improving).  She was recently started on low dose Crestor 5 mg po daily at bedtime by her PCP.  She is advised to continue.  4)  Weight/Diet:  Her Body mass index is 56.13 kg/m. - clearly complicating her diabetes care.  She is a candidate for modest weight loss.  She has multiple comorbid situations complicating her chance of bariatric surgery.  I discussed with her the fact that  loss of 5 - 10% of her  current body weight will have the most impact on her diabetes management.  CDE Consult will be initiated . Exercise, and detailed carbohydrates information provided  -  detailed on discharge instructions.  5) Hypothyroidism:  -Her previsit thyroid function tests are consistent with appropriate hormone replacement.  She is advised to continue Levothyroxine 75 mcg po daily before breakfast.  Will recheck thyroid function tests prior to next visit.   - We discussed about the correct intake of her thyroid hormone, on empty stomach at fasting, with water, separated by at least 30 minutes from breakfast and other medications,  and separated by more than 4 hours from calcium, iron, multivitamins, acid reflux medications (PPIs). -Patient is made aware of the fact that thyroid hormone replacement is needed for life, dose to be adjusted by periodic monitoring of thyroid function tests.  6) Chronic Care/Health Maintenance: -she is not on ACE/ARB (intolerant to both) is on statin medications and is encouraged to initiate and continue to follow up with Ophthalmology, Dentist,  Podiatrist at least yearly or according to recommendations, and advised to  stay away from smoking. I have recommended yearly flu vaccine and pneumonia vaccine at least every 5 years; moderate intensity exercise for up to 150 minutes weekly; and  sleep for at least 7 hours a day.  - she is  advised to maintain close follow up with Fayrene Helper, MD for primary care needs, as well as her other providers for optimal and coordinated care.  - Time spent on this patient care encounter:  40 min, of which > 50% was spent in  counseling and the rest reviewing her blood glucose logs , discussing her hypoglycemia and hyperglycemia episodes, reviewing her current and  previous labs / studies  ( including abstraction from other facilities) and medications  doses and developing a  long term treatment plan and documenting her  care.   Please refer to Patient Instructions for Blood Glucose Monitoring and Insulin/Medications Dosing Guide"  in media tab for additional information. Please  also refer to " Patient Self Inventory" in the Media  tab for reviewed elements of pertinent patient history.  [place] participated in the discussions, expressed understanding, and voiced agreement with the above plans.  All questions were answered to her satisfaction. she is encouraged to contact clinic should she have any questions or concerns prior to her return visit.  Follow up plan: - Return in about 3 months (around 09/19/2020) for Diabetes follow up- A1c and urine micro in office, Thyroid follow up, Previsit labs.  Rayetta Pigg,  FNP-BC [facility] [address] [place], [place] [address] Phone: [phone] Fax: [phone]   06/22/2020, 10:07 AM

## 2020-06-22 NOTE — Patient Instructions (Signed)

## 2020-06-23 ENCOUNTER — Telehealth: Payer: Self-pay

## 2020-06-23 NOTE — Telephone Encounter (Signed)
Patient returning call to discuss her meds.

## 2020-06-24 ENCOUNTER — Other Ambulatory Visit: Payer: Self-pay | Admitting: Family Medicine

## 2020-06-24 DIAGNOSIS — I10 Essential (primary) hypertension: Secondary | ICD-10-CM

## 2020-06-24 NOTE — Telephone Encounter (Signed)
Not sure what the return call is pertaining to. LVM to call back if she still needed anything

## 2020-07-01 ENCOUNTER — Other Ambulatory Visit: Payer: Self-pay | Admitting: "Endocrinology

## 2020-07-10 ENCOUNTER — Other Ambulatory Visit: Payer: Self-pay | Admitting: Family Medicine

## 2020-07-12 ENCOUNTER — Ambulatory Visit (INDEPENDENT_AMBULATORY_CARE_PROVIDER_SITE_OTHER): Payer: Medicare Other | Admitting: Podiatry

## 2020-07-12 ENCOUNTER — Encounter: Payer: Self-pay | Admitting: Podiatry

## 2020-07-12 ENCOUNTER — Other Ambulatory Visit: Payer: Self-pay

## 2020-07-12 DIAGNOSIS — L84 Corns and callosities: Secondary | ICD-10-CM | POA: Diagnosis not present

## 2020-07-12 DIAGNOSIS — B351 Tinea unguium: Secondary | ICD-10-CM

## 2020-07-12 DIAGNOSIS — M2141 Flat foot [pes planus] (acquired), right foot: Secondary | ICD-10-CM

## 2020-07-12 DIAGNOSIS — M79675 Pain in left toe(s): Secondary | ICD-10-CM

## 2020-07-12 DIAGNOSIS — M2142 Flat foot [pes planus] (acquired), left foot: Secondary | ICD-10-CM

## 2020-07-12 DIAGNOSIS — E1142 Type 2 diabetes mellitus with diabetic polyneuropathy: Secondary | ICD-10-CM | POA: Diagnosis not present

## 2020-07-12 DIAGNOSIS — M79674 Pain in right toe(s): Secondary | ICD-10-CM | POA: Diagnosis not present

## 2020-07-12 DIAGNOSIS — Q828 Other specified congenital malformations of skin: Secondary | ICD-10-CM

## 2020-07-12 NOTE — Progress Notes (Signed)
Subjective: Angela [name] presents today for at risk foot care with history of diabetic neuropathy and callus(es) plantar aspect of both feet and painful thick toenails that are difficult to trim. Painful toenails interfere with ambulation. Aggravating factors include wearing enclosed shoe gear. Pain is relieved with periodic professional debridement. Painful calluses are aggravated when weightbearing with and without shoegear. Pain is relieved with periodic professional debridement.   Fayrene Helper, MD is patient's PCP. Last visit was 06/03/2020.  Her daughter is present during today's visit. She states she likes her diabetic shoes. She had to get a men's size due to width requirements.  Allergies  Allergen Reactions  . Latex Itching and Rash  . Morphine Other (See Comments)    Reaction with esophagus, unable to swallow.   . Morphine And Related Shortness Of Breath and Swelling  . Other Itching, Shortness Of Breath and Swelling  . Ace Inhibitors Cough  . Aspirin Other (See Comments)    Reaction with esophagus, unable to swallow.   . Losartan Cough  . Tapentadol Other (See Comments)    Nausea, increased sleepiness, h/a  . Tomato Other (See Comments)    Acid reflux due to acid in tomato  . Adhesive [Tape] Rash   Objective: There were no vitals filed for this visit.  [place] is a pleasant 60 y.o. African [place] female, morbidly obese,  in NAD. AAO X 3.  Vascular Examination: Capillary refill time to digits immediate b/l. Palpable DP pulse(s) b/l lower extremities Palpable PT pulse(s) b/l lower extremities Pedal hair sparse. Lower extremity skin temperature gradient within normal limits. No pain with calf compression b/l. No edema noted b/l lower extremities.  Dermatological Examination: Pedal skin with normal turgor, texture and tone bilaterally. No open wounds bilaterally. No interdigital macerations bilaterally. Toenails 1-5 b/l elongated, discolored, dystrophic,  thickened, crumbly with subungual debris and tenderness to dorsal palpation. Porokeratotic lesion(s) submet head 5 left foot, left foot, right foot and posterior aspect of heel. No erythema, no edema, no drainage, no flocculence.  Musculoskeletal Examination: Normal muscle strength 5/5 to all lower extremity muscle groups bilaterally. No pain crepitus or joint limitation noted with ROM b/l. Hallux valgus with bunion deformity noted b/l lower extremities. Utilizes cane for ambulation assistance. Palpable osteophytes noted dorsal aspect of midfoot b/l.  Neurological Examination: Pt has subjective symptoms of neuropathy. Protective sensation decreased with 10 gram monofilament b/l. Vibratory sensation intact b/l. Clonus negative b/l.  Hemoglobin A1C Latest Ref Rng & Units 06/22/2020 03/22/2020 11/20/2019 07/16/2019  HGBA1C 0.0 - 7.0 % 9.1(A) 12.1(A) 11.4(A) 11.8(A)  Some recent data might be hidden   Assessment: 1. Pain due to onychomycosis of toenails of both feet   2. Porokeratosis   3. Callus   4. Pes planus of both feet   5. Diabetic peripheral neuropathy associated with type 2 diabetes mellitus (HCC)    Plan: -Examined patient. -Toenails 1-5 b/l were debrided in length and girth with sterile nail nippers and dremel without iatrogenic bleeding.  -Callus(es) plantarlateral aspect of right foot pared utilizing sterile scalpel blade without incident. Total number debrided =1. -Painful porokeratotic lesion(s) pared with sterile scalpel submet head 5 left foot. Pinpoint bleeding addressed with Lumicain Hemostatic solution. Triple antibiotic ointment and dressing applied. She may remove on tomorrow. Daughter instructed to apply antibiotic ointment once daily for one week. No dressing required. Total number of lesions debrided=1.  -Patient to report any pedal injuries to medical professional immediately. -Patient to continue soft, supportive shoe gear daily. -Patient/POA to  call should there be  question/concern in the interim.  Return in about 3 months (around 10/12/2020).  Marzetta Board, DPM

## 2020-07-15 ENCOUNTER — Ambulatory Visit (INDEPENDENT_AMBULATORY_CARE_PROVIDER_SITE_OTHER): Payer: Medicare Other | Admitting: Internal Medicine

## 2020-07-15 ENCOUNTER — Other Ambulatory Visit: Payer: Self-pay

## 2020-07-15 ENCOUNTER — Encounter: Payer: Self-pay | Admitting: Internal Medicine

## 2020-07-15 VITALS — BP 127/76 | HR 99 | Resp 18 | Ht 64.0 in | Wt 330.0 lb

## 2020-07-15 DIAGNOSIS — F4310 Post-traumatic stress disorder, unspecified: Secondary | ICD-10-CM | POA: Diagnosis not present

## 2020-07-15 DIAGNOSIS — I5032 Chronic diastolic (congestive) heart failure: Secondary | ICD-10-CM

## 2020-07-15 DIAGNOSIS — I10 Essential (primary) hypertension: Secondary | ICD-10-CM | POA: Diagnosis not present

## 2020-07-15 DIAGNOSIS — R058 Other specified cough: Secondary | ICD-10-CM

## 2020-07-15 MED ORDER — BENZONATATE 100 MG PO CAPS: 100.0000 mg | ORAL_CAPSULE | Freq: Two times a day (BID) | ORAL | 0 refills | Status: DC | PRN

## 2020-07-15 NOTE — Assessment & Plan Note (Signed)
BP Readings from Last 1 Encounters:  07/15/20 127/76   Well-controlled with Amlodipine, Metoprolol and Clonidine Counseled for compliance with the medications Advised DASH diet and moderate exercise/walking, at least 150 mins/week

## 2020-07-15 NOTE — Assessment & Plan Note (Signed)
Euvolemic currently On Lasix 40 mg QD

## 2020-07-15 NOTE — Patient Instructions (Signed)
Continue taking your medications regularly.  Please continue to follow low salt diet and perform moderate exercise/walking at least 150 mins/week.  You are being referred to Psychiatry for evaluation.
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
## 2020-07-15 NOTE — Assessment & Plan Note (Addendum)
Was on Prozac Ambien PRN for insomnia Referred to Psychiatry

## 2020-07-16 ENCOUNTER — Ambulatory Visit (HOSPITAL_COMMUNITY): Payer: Medicare Other

## 2020-07-26 ENCOUNTER — Other Ambulatory Visit: Payer: Self-pay

## 2020-07-26 MED ORDER — UNABLE TO FIND: 3 refills | Status: DC

## 2020-07-29 ENCOUNTER — Telehealth: Payer: Self-pay

## 2020-07-29 NOTE — Telephone Encounter (Signed)
Patient called would like to be have her referral switch from [facility] to [facility] in [place].

## 2020-08-08 ENCOUNTER — Other Ambulatory Visit: Payer: Self-pay | Admitting: Nurse Practitioner

## 2020-08-08 ENCOUNTER — Other Ambulatory Visit: Payer: Self-pay | Admitting: Internal Medicine

## 2020-08-08 ENCOUNTER — Other Ambulatory Visit: Payer: Self-pay | Admitting: Family Medicine

## 2020-08-08 DIAGNOSIS — M62830 Muscle spasm of back: Secondary | ICD-10-CM

## 2020-08-08 DIAGNOSIS — E1159 Type 2 diabetes mellitus with other circulatory complications: Secondary | ICD-10-CM

## 2020-08-08 DIAGNOSIS — R058 Other specified cough: Secondary | ICD-10-CM

## 2020-08-08 DIAGNOSIS — I10 Essential (primary) hypertension: Secondary | ICD-10-CM

## 2020-08-23 DIAGNOSIS — L400 Psoriasis vulgaris: Secondary | ICD-10-CM | POA: Diagnosis not present

## 2020-08-23 DIAGNOSIS — I83893 Varicose veins of bilateral lower extremities with other complications: Secondary | ICD-10-CM | POA: Diagnosis not present

## 2020-08-23 DIAGNOSIS — L280 Lichen simplex chronicus: Secondary | ICD-10-CM | POA: Diagnosis not present

## 2020-08-26 ENCOUNTER — Encounter: Payer: Self-pay | Admitting: Family Medicine

## 2020-08-26 ENCOUNTER — Ambulatory Visit (INDEPENDENT_AMBULATORY_CARE_PROVIDER_SITE_OTHER): Payer: Medicare Other | Admitting: Family Medicine

## 2020-08-26 ENCOUNTER — Other Ambulatory Visit: Payer: Self-pay

## 2020-08-26 VITALS — BP 165/84 | HR 105 | Resp 15 | Ht 64.0 in | Wt 334.0 lb

## 2020-08-26 DIAGNOSIS — F322 Major depressive disorder, single episode, severe without psychotic features: Secondary | ICD-10-CM

## 2020-08-26 DIAGNOSIS — I10 Essential (primary) hypertension: Secondary | ICD-10-CM

## 2020-08-26 DIAGNOSIS — E782 Mixed hyperlipidemia: Secondary | ICD-10-CM | POA: Diagnosis not present

## 2020-08-26 DIAGNOSIS — R0600 Dyspnea, unspecified: Secondary | ICD-10-CM

## 2020-08-26 DIAGNOSIS — E1165 Type 2 diabetes mellitus with hyperglycemia: Secondary | ICD-10-CM | POA: Diagnosis not present

## 2020-08-26 DIAGNOSIS — F4310 Post-traumatic stress disorder, unspecified: Secondary | ICD-10-CM

## 2020-08-26 DIAGNOSIS — R52 Pain, unspecified: Secondary | ICD-10-CM | POA: Diagnosis not present

## 2020-08-26 DIAGNOSIS — R0609 Other forms of dyspnea: Secondary | ICD-10-CM

## 2020-08-26 MED ORDER — SPIRONOLACTONE 25 MG PO TABS: 25.0000 mg | ORAL_TABLET | Freq: Every day | ORAL | 3 refills | Status: DC

## 2020-08-26 NOTE — Patient Instructions (Addendum)
F/U in 14 weeks, call if you need me sooner  Please reschedule mammogram at checkout  Fasting lipid, cmp and eGFR 1 week before next visit   New additional medication for blood pressure is spironolactone 25 mg one  Daily  No change in  Other medications   You do need and qualify for portable oxygen as your oxygen falls low when you walk  Thanks for choosing [facility], we consider it a privelige to serve you.

## 2020-08-26 NOTE — Progress Notes (Signed)
Angela [name]     MRN: [id number]      DOB: 1960-08-30   HPI Angela [name] is here for follow up and re-evaluation of chronic medical conditions, medication management and review of any available recent lab and radiology data.  Preventive health is updated, specifically  Cancer screening and Immunization.   Questions or concerns regarding consultations or procedures which the PT has had in the interim are  addressed. The PT denies any adverse reactions to current medications since the last visit.  C/o worsening shortness of breath with activity. Denies new PND or rothopnea or leg swelling ROS Denies recent fever or chills. Denies sinus pressure, nasal congestion, ear pain or sore throat. Denies chest congestion, productive cough or wheezing. Denies chest pains, palpitations and leg swelling Denies abdominal pain, nausea, vomiting,diarrhea or constipation.   Denies dysuria, frequency, hesitancy C/o chronic  joint pain, swelling and limitation in mobility. Denies headaches, seizures, numbness, or tingling. C/o  depression, anxiety and  Insomnia.Still needs mental health help and wants this Denies skin break down or rash.   PE  BP (!) 165/84   Pulse (!) 105   Resp 15   Ht 5\' 4"  (1.626 m)   Wt (!) 334 lb (151.5 kg)   SpO2 90%   BMI 57.33 kg/m   Patient alert and oriented and in mild  cardiopulmonary distress. Oxygen at rest on room air 90% With ambulation falls to 87% on room air, and after 2l/min recovers to 99 %  HEENT: No facial asymmetry, EOMI,     Neck supple .  Chest: Clear to auscultation bilaterally.  CVS: S1, S2 systolic murmur, no S3.Regular rate.  ABD: Soft non tender.   Ext: No edema  MS: decreased  ROM spine, shoulders, hips and knees.  Skin: Intact, no ulcerations or rash noted.  Psych: Good eye contact, normal affect. Memory intact not anxious or depressed appearing.  CNS: CN 2-12 intact, power,  normal throughout.no focal deficits noted.   Assessment  & Plan  Essential hypertension, benign Uncontrolled add daily spironolactone DASH diet and commitment to daily physical activity for a minimum of 30 minutes discussed and encouraged, as a part of hypertension management. The importance of attaining a healthy weight is also discussed.  BP/Weight 08/26/2020 07/15/2020 06/22/2020 06/03/2020 05/06/2020 04/15/2020 20/25/4270  Systolic BP 623 762 831 517 616 073 -  Diastolic BP 84 76 73 94 81 91 -  Wt. (Lbs) 334 330 327 310 321 313 326  BMI 57.33 56.64 56.13 53.21 55.1 53.73 55.96       Uncontrolled type 2 diabetes mellitus with hyperglycemia ([place]) Managed by endo, improving Angela [name] is reminded of the importance of commitment to daily physical activity for 30 minutes or more, as able and the need to limit carbohydrate intake to 30 to 60 grams per meal to help with blood sugar control.   The need to take medication as prescribed, test blood sugar as directed, and to call between visits if there is a concern that blood sugar is uncontrolled is also discussed.   Angela [name] is reminded of the importance of daily foot exam, annual eye examination, and good blood sugar, blood pressure and cholesterol control.  Diabetic Labs Latest Ref Rng & Units 06/22/2020 05/06/2020 03/22/2020 02/11/2020 11/20/2019  HbA1c 0.0 - 7.0 % 9.1(A) - 12.1(A) - 11.4(A)  Microalbumin Not Estab. ug/mL - - - - -  Micro/Creat Ratio <30 mcg/mg creat - - - - -  Chol 100 - 199 mg/dL -  197 - - -  HDL >39 mg/dL - 44 - - -  Calc LDL 0 - 99 mg/dL - 124(H) - - -  Triglycerides 0 - 149 mg/dL - 164(H) - - -  Creatinine 0.57 - 1.00 mg/dL - 0.90 - 0.91 -  GFR >60.00 mL/min - - - - -   BP/Weight 08/26/2020 07/15/2020 06/22/2020 06/03/2020 05/06/2020 04/15/2020 67/61/9509  Systolic BP 326 712 458 099 833 825 -  Diastolic BP 84 76 73 94 81 91 -  Wt. (Lbs) 334 330 327 310 321 313 326  BMI 57.33 56.64 56.13 53.21 55.1 53.73 55.96   Foot/eye exam completion dates Latest Ref Rng & Units 02/11/2020  12/25/2019  Eye Exam No Retinopathy - No Retinopathy  Foot Form Completion - Done -        Morbid obesity ([place])  Patient re-educated about  the importance of commitment to a  minimum of 150 minutes of exercise per week as able.  The importance of healthy food choices with portion control discussed, as well as eating regularly and within a 12 hour window most days. The need to choose "clean , green" food 50 to 75% of the time is discussed, as well as to make water the primary drink and set a goal of 64 ounces water daily.    Weight /BMI 08/26/2020 07/15/2020 06/22/2020  WEIGHT 334 lb 330 lb 327 lb  HEIGHT 5\' 4"  5\' 4"  5\' 4"   BMI 57.33 kg/m2 56.64 kg/m2 56.13 kg/m2      Encounter for pain management The patient's Controlled Substance registry is reviewed and compliance confirmed. Adequacy of  Pain control and level of function is assessed. Medication dosing is adjusted as deemed appropriate. Twelve weeks of medication is prescribed , with a follow up appointment between 11 to 12 weeks .   DOE (dyspnea on exertion) Hypoxic on room air, pulse ox is 90 % , with ambulation falls to 87 % on room air, with the addition of 2l/min oxygen recovers to 99%. Requires supplemental oxygen for use throughout the day both at rest and with activity  HLD (hyperlipidemia) Hyperlipidemia:Low fat diet discussed and encouraged.   Lipid Panel  Lab Results  Component Value Date   CHOL 197 05/06/2020   HDL 44 05/06/2020   LDLCALC 124 (H) 05/06/2020   TRIG 164 (H) 05/06/2020   CHOLHDL 4.5 (H) 05/06/2020   Reduce fat intake and start low dose crestor    Depression, Martinezlopez, single episode, severe ([facility]CC) Uncontrolled score of 12, still awaiting, wants and needs therapy will continue to follow up on this, new referral made also

## 2020-08-27 ENCOUNTER — Telehealth: Payer: Self-pay

## 2020-08-27 NOTE — Telephone Encounter (Signed)
Angela [name] please call pt Monday am about her O2 - she said her daughter called today but I do not see a note.  She said it will be ok to wait till Monday.  You may already be working on this.  Thanks

## 2020-08-28 ENCOUNTER — Other Ambulatory Visit: Payer: Self-pay | Admitting: Pulmonary Disease

## 2020-08-29 ENCOUNTER — Other Ambulatory Visit: Payer: Self-pay | Admitting: "Endocrinology

## 2020-08-29 NOTE — Assessment & Plan Note (Signed)
Uncontrolled score of 12, still awaiting, wants and needs therapy will continue to follow up on this, new referral made also

## 2020-08-29 NOTE — Assessment & Plan Note (Signed)
Managed by endo, improving Angela [name] is reminded of the importance of commitment to daily physical activity for 30 minutes or more, as able and the need to limit carbohydrate intake to 30 to 60 grams per meal to help with blood sugar control.   The need to take medication as prescribed, test blood sugar as directed, and to call between visits if there is a concern that blood sugar is uncontrolled is also discussed.   Angela [name] is reminded of the importance of daily foot exam, annual eye examination, and good blood sugar, blood pressure and cholesterol control.  Diabetic Labs Latest Ref Rng & Units 06/22/2020 05/06/2020 03/22/2020 02/11/2020 11/20/2019  HbA1c 0.0 - 7.0 % 9.1(A) - 12.1(A) - 11.4(A)  Microalbumin Not Estab. ug/mL - - - - -  Micro/Creat Ratio <30 mcg/mg creat - - - - -  Chol 100 - 199 mg/dL - 197 - - -  HDL >39 mg/dL - 44 - - -  Calc LDL 0 - 99 mg/dL - 124(H) - - -  Triglycerides 0 - 149 mg/dL - 164(H) - - -  Creatinine 0.57 - 1.00 mg/dL - 0.90 - 0.91 -  GFR >60.00 mL/min - - - - -   BP/Weight 08/26/2020 07/15/2020 06/22/2020 06/03/2020 05/06/2020 04/15/2020 99/24/2683  Systolic BP 419 622 297 989 211 941 -  Diastolic BP 84 76 73 94 81 91 -  Wt. (Lbs) 334 330 327 310 321 313 326  BMI 57.33 56.64 56.13 53.21 55.1 53.73 55.96   Foot/eye exam completion dates Latest Ref Rng & Units 02/11/2020 12/25/2019  Eye Exam No Retinopathy - No Retinopathy  Foot Form Completion - Done -

## 2020-08-29 NOTE — Assessment & Plan Note (Signed)
Hyperlipidemia:Low fat diet discussed and encouraged.   Lipid Panel  Lab Results  Component Value Date   CHOL 197 05/06/2020   HDL 44 05/06/2020   LDLCALC 124 (H) 05/06/2020   TRIG 164 (H) 05/06/2020   CHOLHDL 4.5 (H) 05/06/2020   Reduce fat intake and start low dose crestor

## 2020-08-29 NOTE — Assessment & Plan Note (Signed)
  Patient re-educated about  the importance of commitment to a  minimum of 150 minutes of exercise per week as able.  The importance of healthy food choices with portion control discussed, as well as eating regularly and within a 12 hour window most days. The need to choose "clean , green" food 50 to 75% of the time is discussed, as well as to make water the primary drink and set a goal of 64 ounces water daily.    Weight /BMI 08/26/2020 07/15/2020 06/22/2020  WEIGHT 334 lb 330 lb 327 lb  HEIGHT 5\' 4"  5\' 4"  5\' 4"   BMI 57.33 kg/m2 56.64 kg/m2 56.13 kg/m2

## 2020-08-29 NOTE — Assessment & Plan Note (Signed)
Uncontrolled add daily spironolactone DASH diet and commitment to daily physical activity for a minimum of 30 minutes discussed and encouraged, as a part of hypertension management. The importance of attaining a healthy weight is also discussed.  BP/Weight 08/26/2020 07/15/2020 06/22/2020 06/03/2020 05/06/2020 04/15/2020 01/00/7121  Systolic BP 975 883 254 982 641 583 -  Diastolic BP 84 76 73 94 81 91 -  Wt. (Lbs) 334 330 327 310 321 313 326  BMI 57.33 56.64 56.13 53.21 55.1 53.73 55.96

## 2020-08-29 NOTE — Assessment & Plan Note (Signed)
Hypoxic on room air, pulse ox is 90 % , with ambulation falls to 87 % on room air, with the addition of 2l/min oxygen recovers to 99%. Requires supplemental oxygen for use throughout the day both at rest and with activity

## 2020-08-29 NOTE — Assessment & Plan Note (Signed)
The patient's Controlled Substance registry is reviewed and compliance confirmed. Adequacy of  Pain control and level of function is assessed. Medication dosing is adjusted as deemed appropriate. Twelve weeks of medication is prescribed , with a follow up appointment between 11 to 12 weeks .  

## 2020-08-30 ENCOUNTER — Encounter: Payer: Self-pay | Admitting: Family Medicine

## 2020-08-30 ENCOUNTER — Other Ambulatory Visit: Payer: Self-pay

## 2020-08-30 ENCOUNTER — Ambulatory Visit (INDEPENDENT_AMBULATORY_CARE_PROVIDER_SITE_OTHER): Payer: Medicare Other | Admitting: Family Medicine

## 2020-08-30 ENCOUNTER — Other Ambulatory Visit: Payer: Self-pay | Admitting: Family Medicine

## 2020-08-30 ENCOUNTER — Ambulatory Visit (HOSPITAL_COMMUNITY): Payer: Medicare Other

## 2020-08-30 VITALS — Ht 64.0 in | Wt 353.0 lb

## 2020-08-30 DIAGNOSIS — R0600 Dyspnea, unspecified: Secondary | ICD-10-CM

## 2020-08-30 DIAGNOSIS — J040 Acute laryngitis: Secondary | ICD-10-CM | POA: Diagnosis not present

## 2020-08-30 DIAGNOSIS — G4733 Obstructive sleep apnea (adult) (pediatric): Secondary | ICD-10-CM

## 2020-08-30 DIAGNOSIS — J208 Acute bronchitis due to other specified organisms: Secondary | ICD-10-CM | POA: Diagnosis not present

## 2020-08-30 DIAGNOSIS — R0609 Other forms of dyspnea: Secondary | ICD-10-CM

## 2020-08-30 MED ORDER — OXYCODONE HCL ER 40 MG PO T12A: 40.0000 mg | EXTENDED_RELEASE_TABLET | Freq: Two times a day (BID) | ORAL | 0 refills | Status: AC

## 2020-08-30 MED ORDER — BENZONATATE 100 MG PO CAPS: 100.0000 mg | ORAL_CAPSULE | Freq: Two times a day (BID) | ORAL | 0 refills | Status: DC | PRN

## 2020-08-30 MED ORDER — PENICILLIN V POTASSIUM 500 MG PO TABS: 500.0000 mg | ORAL_TABLET | Freq: Three times a day (TID) | ORAL | 0 refills | Status: AC

## 2020-08-30 MED ORDER — FLUCONAZOLE 150 MG PO TABS: ORAL_TABLET | ORAL | 0 refills | Status: DC

## 2020-08-30 NOTE — Patient Instructions (Addendum)
F/u in 2 to 3 weeks, re evaluate blood pressure and bronchitis call if  you need me sooner.  You are prescribed penicillin, tessalon perles ands fluconazole for acute bronchitis and laryngitis   Please get your CXR as soon as possible this week     Acute Bronchitis, Adult  Acute bronchitis is when air tubes in the lungs (bronchi) suddenly get swollen. The condition can make it hard for you to breathe. In adults, acute bronchitis usually goes away within 2 weeks. A cough caused by bronchitis may last up to 3 weeks. Smoking, allergies, and asthma can make the condition worse. What are the causes? This condition is caused by:  Cold and flu viruses. The most common cause of this condition is the virus that causes the common cold.  Bacteria.  Substances that irritate the lungs, including: ? Smoke from cigarettes and other types of tobacco. ? Dust and pollen. ? Fumes from chemicals, gases, or burned fuel. ? Other materials that pollute indoor or outdoor air.  Close contact with someone who has acute bronchitis. What increases the risk? The following factors may make you more likely to develop this condition:  A weak body's defense system. This is also called the immune system.  Any condition that affects your lungs and breathing, such as asthma. What are the signs or symptoms? Symptoms of this condition include:  A cough.  Coughing up clear, yellow, or green mucus.  Wheezing.  Chest congestion.  Shortness of breath.  A fever.  Body aches.  Chills.  A sore throat. How is this treated? Acute bronchitis may go away over time without treatment. Your doctor may recommend:  Drinking more fluids.  Taking a medicine for a fever or cough.  Using a device that gets medicine into your lungs (inhaler).  Using a vaporizer or a humidifier. These are machines that add water or moisture in the air to help with coughing and poor breathing. Follow these instructions at  home: Activity  Get a lot of rest.  Avoid places where there are fumes from chemicals.  Return to your normal activities as told by your doctor. Ask your doctor what activities are safe for you. Lifestyle  Drink enough fluids to keep your pee (urine) pale yellow.  Do not drink alcohol.  Do not use any products that contain nicotine or tobacco, such as cigarettes, e-cigarettes, and chewing tobacco. If you need help quitting, ask your doctor. Be aware that: ? Your bronchitis will get worse if you smoke or breathe in other people's smoke (secondhand smoke). ? Your lungs will heal faster if you quit smoking. General instructions  Take over-the-counter and prescription medicines only as told by your doctor.  Use an inhaler, cool mist vaporizer, or humidifier as told by your doctor.  Rinse your mouth often with salt water. To make salt water, dissolve -1 tsp (3-6 g) of salt in 1 cup (237 mL) of warm water.  Keep all follow-up visits as told by your doctor. This is important.   How is this prevented? To lower your risk of getting this condition again:  Wash your hands often with soap and water. If soap and water are not available, use hand sanitizer.  Avoid contact with people who have cold symptoms.  Try not to touch your mouth, nose, or eyes with your hands.  Make sure to get the flu shot every year.   Contact a doctor if:  Your symptoms do not get better in 2 weeks.  You  vomit more than once or twice.  You have symptoms of loss of fluid from your body (dehydration). These include: ? Dark urine. ? Dry skin or eyes. ? Increased thirst. ? Headaches. ? Confusion. ? Muscle cramps. Get help right away if:  You cough up blood.  You have chest pain.  You have very bad shortness of breath.  You become dehydrated.  You faint or keep feeling like you are going to faint.  You keep vomiting.  You have a very bad headache.  Your fever or chills get worse. These  symptoms may be an emergency. Do not wait to see if the symptoms will go away. Get medical help right away. Call your local emergency services (911 in the [place] Do not drive yourself to the hospital. Summary  Acute bronchitis is when air tubes in the lungs (bronchi) suddenly get swollen. In adults, acute bronchitis usually goes away within 2 weeks.  Take over-the-counter and prescription medicines only as told by your doctor.  Drink enough fluid to keep your pee (urine) pale yellow.  Contact a doctor if your symptoms do not improve after 2 weeks of treatment.  Get help right away if you cough up blood, faint, or have chest pain or shortness of breath. This information is not intended to replace advice given to you by your health care provider. Make sure you discuss any questions you have with your health care provider. Document Revised: 11/08/2018 Document Reviewed: 11/08/2018 Elsevier Patient Education  Angela [name].

## 2020-08-30 NOTE — Telephone Encounter (Signed)
Order has been sent to [facility] and will call daughter back and let her know

## 2020-08-30 NOTE — Progress Notes (Signed)
Virtual Visit via Telephone Note  I connected with [place] on 08/30/20 at  1:40 PM EDT by telephone and verified that I am speaking with the correct person using two identifiers.  Location: Patient: home  Provider: office   I discussed the limitations, risks, security and privacy concerns of performing an evaluation and management service by telephone and the availability of in person appointments. I also discussed with the patient that there may be a patient responsible charge related to this service. The patient expressed understanding and agreed to proceed.   History of Present Illness:   4 day h/o cough productive of yellow sputum , loss of voice, no nasal drainage deneis fever or chills, denies ear pain, painless hoarseness Observations/Objective: Ht 5\' 4"  (1.626 m)   Wt (!) 353 lb (160.1 kg)   BMI 60.59 kg/m  Good communication with no confusion and intact memory. Alert and oriented x 3 No signs of respiratory distress during speech    Assessment and Plan:  Acute bronchitis CXR, antibiotic, decongestant and fluconazole  Acute laryngitis Voice rst, gargle and antibiotic   Follow Up Instructions:    I discussed the assessment and treatment plan with the patient. The patient was provided an opportunity to ask questions and all were answered. The patient agreed with the plan and demonstrated an understanding of the instructions.   The patient was advised to call back or seek an in-person evaluation if the symptoms worsen or if the condition fails to improve as anticipated.  I provided 12 minutes of non-face-to-face time during this encounter.   [name], MD

## 2020-08-31 ENCOUNTER — Encounter: Payer: Self-pay | Admitting: Family Medicine

## 2020-08-31 ENCOUNTER — Ambulatory Visit: Payer: Medicare Other

## 2020-08-31 DIAGNOSIS — J040 Acute laryngitis: Secondary | ICD-10-CM | POA: Insufficient documentation

## 2020-08-31 NOTE — Assessment & Plan Note (Signed)
CXR, antibiotic, decongestant and fluconazole

## 2020-08-31 NOTE — Assessment & Plan Note (Signed)
Voice rst, gargle and antibiotic

## 2020-09-01 NOTE — Telephone Encounter (Signed)
Pt aware.

## 2020-09-01 NOTE — Telephone Encounter (Signed)
Contacted Angela [name] at [facility] and told him to contact her by her cell number to arrange time for delivery

## 2020-09-03 ENCOUNTER — Telehealth: Payer: Self-pay

## 2020-09-03 DIAGNOSIS — R0609 Other forms of dyspnea: Secondary | ICD-10-CM | POA: Diagnosis not present

## 2020-09-03 NOTE — Telephone Encounter (Signed)
Patient called and states that the oxygen people did not deliver any oxygen to her today ph# [phone]

## 2020-09-03 NOTE — Telephone Encounter (Signed)
Spoke with Ashly with [employer], her equipment is on the truck and should be delivered today. Pt informed.

## 2020-09-06 ENCOUNTER — Other Ambulatory Visit (HOSPITAL_COMMUNITY): Payer: Self-pay | Admitting: Family Medicine

## 2020-09-06 ENCOUNTER — Ambulatory Visit (HOSPITAL_COMMUNITY)
Admission: RE | Admit: 2020-09-06 | Discharge: 2020-09-06 | Disposition: A | Discharge status: Home / Self Care | Payer: Medicare Other | Source: Ambulatory Visit | Attending: Family Medicine | Admitting: Family Medicine

## 2020-09-06 DIAGNOSIS — N6489 Other specified disorders of breast: Secondary | ICD-10-CM

## 2020-09-06 DIAGNOSIS — J208 Acute bronchitis due to other specified organisms: Secondary | ICD-10-CM | POA: Diagnosis not present

## 2020-09-06 DIAGNOSIS — R928 Other abnormal and inconclusive findings on diagnostic imaging of breast: Secondary | ICD-10-CM

## 2020-09-06 DIAGNOSIS — J9811 Atelectasis: Secondary | ICD-10-CM | POA: Diagnosis not present

## 2020-09-06 DIAGNOSIS — R059 Cough, unspecified: Secondary | ICD-10-CM | POA: Diagnosis not present

## 2020-09-06 DIAGNOSIS — Z1231 Encounter for screening mammogram for malignant neoplasm of breast: Secondary | ICD-10-CM | POA: Insufficient documentation

## 2020-09-09 ENCOUNTER — Other Ambulatory Visit: Payer: Self-pay | Admitting: Family Medicine

## 2020-09-13 ENCOUNTER — Telehealth: Payer: Self-pay

## 2020-09-13 ENCOUNTER — Other Ambulatory Visit: Payer: Self-pay

## 2020-09-13 MED ORDER — FLUCONAZOLE 150 MG PO TABS: ORAL_TABLET | ORAL | 0 refills | Status: DC

## 2020-09-13 NOTE — Telephone Encounter (Signed)
1 sent

## 2020-09-13 NOTE — Telephone Encounter (Signed)
Patient called and needs one more yeast infection order sent to [facility] [place] [facility].   Will she need an appt? Patient call back # [phone].

## 2020-09-14 ENCOUNTER — Encounter (HOSPITAL_COMMUNITY): Payer: Medicare Other

## 2020-09-14 ENCOUNTER — Encounter (HOSPITAL_COMMUNITY): Payer: Self-pay

## 2020-09-14 ENCOUNTER — Ambulatory Visit (HOSPITAL_COMMUNITY): Payer: Medicare Other

## 2020-09-14 DIAGNOSIS — E039 Hypothyroidism, unspecified: Secondary | ICD-10-CM | POA: Diagnosis not present

## 2020-09-15 LAB — TSH: TSH: 4.08 u[IU]/mL (ref 0.450–4.500)

## 2020-09-15 LAB — T4, FREE: Free T4: 1.3 ng/dL (ref 0.82–1.77)

## 2020-09-20 ENCOUNTER — Ambulatory Visit (INDEPENDENT_AMBULATORY_CARE_PROVIDER_SITE_OTHER): Payer: Medicare Other | Admitting: Nurse Practitioner

## 2020-09-20 ENCOUNTER — Encounter: Payer: Self-pay | Admitting: Nurse Practitioner

## 2020-09-20 ENCOUNTER — Encounter: Payer: Self-pay | Admitting: Family Medicine

## 2020-09-20 ENCOUNTER — Ambulatory Visit (INDEPENDENT_AMBULATORY_CARE_PROVIDER_SITE_OTHER): Payer: Medicare Other | Admitting: Family Medicine

## 2020-09-20 ENCOUNTER — Other Ambulatory Visit: Payer: Self-pay

## 2020-09-20 ENCOUNTER — Ambulatory Visit: Payer: Medicare Other | Admitting: Nurse Practitioner

## 2020-09-20 VITALS — BP 124/78 | HR 67 | Ht 64.0 in | Wt 335.0 lb

## 2020-09-20 VITALS — BP 124/82 | HR 87 | Temp 97.5°F | Ht 64.0 in | Wt 335.0 lb

## 2020-09-20 DIAGNOSIS — E559 Vitamin D deficiency, unspecified: Secondary | ICD-10-CM

## 2020-09-20 DIAGNOSIS — E1159 Type 2 diabetes mellitus with other circulatory complications: Secondary | ICD-10-CM | POA: Diagnosis not present

## 2020-09-20 DIAGNOSIS — E039 Hypothyroidism, unspecified: Secondary | ICD-10-CM

## 2020-09-20 DIAGNOSIS — E1165 Type 2 diabetes mellitus with hyperglycemia: Secondary | ICD-10-CM

## 2020-09-20 DIAGNOSIS — E782 Mixed hyperlipidemia: Secondary | ICD-10-CM | POA: Diagnosis not present

## 2020-09-20 DIAGNOSIS — I10 Essential (primary) hypertension: Secondary | ICD-10-CM

## 2020-09-20 DIAGNOSIS — F322 Major depressive disorder, single episode, severe without psychotic features: Secondary | ICD-10-CM | POA: Diagnosis not present

## 2020-09-20 LAB — POCT GLYCOSYLATED HEMOGLOBIN (HGB A1C): Hemoglobin A1C: 10 % — AB (ref 4.0–5.6)

## 2020-09-20 MED ORDER — INSULIN LISPRO PROT & LISPRO (75-25 MIX) 100 UNIT/ML KWIKPEN
60.0000 [IU] | PEN_INJECTOR | Freq: Two times a day (BID) | SUBCUTANEOUS | 3 refills | Status: DC
Start: 2020-09-20 — End: 2020-12-06

## 2020-09-20 NOTE — Patient Instructions (Signed)

## 2020-09-20 NOTE — Assessment & Plan Note (Signed)
  Patient re-educated about  the importance of commitment to a  minimum of 150 minutes of exercise per week as able.  The importance of healthy food choices with portion control discussed, as well as eating regularly and within a 12 hour window most days. The need to choose "clean , green" food 50 to 75% of the time is discussed, as well as to make water the primary drink and set a goal of 64 ounces water daily.    Weight /BMI 09/20/2020 09/20/2020 08/30/2020  WEIGHT 335 lb 335 lb 353 lb  HEIGHT 5\' 4"  5\' 4"  5\' 4"   BMI 57.5 kg/m2 57.5 kg/m2 60.59 kg/m2

## 2020-09-20 NOTE — Patient Instructions (Signed)
F/U as before, call if you need me sooner  No medication change  Please get fasting labs 5 days before next vsiit  Think about what you will eat, plan ahead. Choose " clean, green, fresh or frozen" over canned, processed or packaged foods which are more sugary, salty and fatty. 70 to 75% of food eaten should be vegetables and fruit. Three meals at set times with snacks allowed between meals, but they must be fruit or vegetables. Aim to eat over a 12 hour period , example 7 am to 7 pm, and STOP after  your last meal of the day. Drink water,generally about 64 ounces per day, no other drink is as healthy. Fruit juice is best enjoyed in a healthy way, by EATING the fruit.  It is important that you exercise regularly at least 30 minutes 5 times a week. If you develop chest pain, have severe difficulty breathing, or feel very tired, stop exercising immediately and seek medical attention  Thanks for choosing  [facility], we consider it a privelige to serve you.
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
## 2020-09-20 NOTE — Assessment & Plan Note (Signed)
Reports improved numbers, managed by endo Ms. Burdi is reminded of the importance of commitment to daily physical activity for 30 minutes or more, as able and the need to limit carbohydrate intake to 30 to 60 grams per meal to help with blood sugar control.   The need to take medication as prescribed, test blood sugar as directed, and to call between visits if there is a concern that blood sugar is uncontrolled is also discussed.   Ms. Burd is reminded of the importance of daily foot exam, annual eye examination, and good blood sugar, blood pressure and cholesterol control.  Diabetic Labs Latest Ref Rng & Units 09/20/2020 06/22/2020 05/06/2020 03/22/2020 02/11/2020  HbA1c 4.0 - 5.6 % 10.0(A) 9.1(A) - 12.1(A) -  Microalbumin Not Estab. ug/mL - - - - -  Micro/Creat Ratio <30 mcg/mg creat - - - - -  Chol 100 - 199 mg/dL - - 197 - -  HDL >39 mg/dL - - 44 - -  Calc LDL 0 - 99 mg/dL - - 124(H) - -  Triglycerides 0 - 149 mg/dL - - 164(H) - -  Creatinine 0.57 - 1.00 mg/dL - - 0.90 - 0.91  GFR >60.00 mL/min - - - - -   BP/Weight 09/20/2020 09/20/2020 08/30/2020 08/26/2020 07/15/2020 1/74/0814 08/07/1854  Systolic BP 314 970 - 263 785 885 027  Diastolic BP 78 82 - 84 76 73 94  Wt. (Lbs) 335 335 353 334 330 327 310  BMI 57.5 57.5 60.59 57.33 56.64 56.13 53.21   Foot/eye exam completion dates Latest Ref Rng & Units 02/11/2020 12/25/2019  Eye Exam No Retinopathy - No Retinopathy  Foot Form Completion - Done -

## 2020-09-20 NOTE — Progress Notes (Signed)
09/20/2020, 3:43 PM                    Endocrinology follow-up note   Subjective:    [name], female    DOB: 1960-10-01.  Angela [name] is being seen in follow-up in the management of currently uncontrolled symptomatic type 2 diabetes.  She also has comorbid dyslipidemia, hypertension.   PMD:  Fayrene Helper, MD.   Past Medical History:  Diagnosis Date  . Allergy    Phreesia 06/02/2020  . Anxiety   . Arthritis   . Asthma   . Asthma    Phreesia 04/12/2020  . CHF (congestive heart failure) ([place])    Phreesia 04/12/2020  . Coronary atherosclerosis of native coronary artery    Mild nonobstructive CAD by cardiac catheterization 2008 - Dr. Terrence Dupont  . Degenerative disc disease   . Depression   . Depression    Phreesia 04/12/2020  . Diabetes mellitus without complication ([place])    Phreesia 04/12/2020  . Dysrhythmia   . Essential hypertension, benign   . Folliculitis 0/63/0160  . GERD (gastroesophageal reflux disease)   . H/O hiatal hernia   . Helicobacter pylori gastritis 2008  . Hypercholesterolemia   . Hypertension   . Hyperthyroidism    s/p radiation  . Left-sided epistaxis 05/15/2018  . Low back pain   . Migraine   . Nephrolithiasis    Recurring episodes since 2004  . Neuromuscular disorder ([place])    Phreesia 04/12/2020  . Severe obesity (BMI >= 40) ([facility])   . Sleep apnea    STOP BANG SCORE 6no cpap used  . Thyroid disease    Phreesia 04/12/2020  . Type 2 diabetes mellitus with diabetic neuropathy [facility])     Past Surgical History:  Procedure Laterality Date  . ABDOMINAL HYSTERECTOMY    . BACK SURGERY  1993  . BACK SURGERY  1994  . BACK SURGERY  2002  . BACK SURGERY  2011   [place] Left   . CHOLECYSTECTOMY  1996  . COLONOSCOPY  2000 [facility]   INT HEMORRHOIDS/FISSURE  . COLONOSCOPY  2003 [facility], CHANGE IN BOWEL HABITS   INT HEMORRHOIDS  . COLONOSCOPY  2006 [facility]   INT HEMORRHOIDS  .  COLONOSCOPY  2007 [facility]   INT HEMORRHOIDS  . CYSTOSCOPY WITH STENT PLACEMENT Left 07/25/2012   Procedure: CYSTOSCOPY, left retograde pyelogram WITH left ureteral  STENT PLACEMENT;  Surgeon: Claybon Jabs, MD;  Location: [facility];  Service: Urology;  Laterality: Left;  . CYSTOSCOPY/RETROGRADE/URETEROSCOPY  12/04/2011   Procedure: CYSTOSCOPY/RETROGRADE/URETEROSCOPY;  Surgeon: Molli Hazard, MD;  Location: [facility];  Service: Urology;  Laterality: Right;  . CYSTOSCOPY/RETROGRADE/URETEROSCOPY/STONE EXTRACTION WITH BASKET Left 09/03/2012   Procedure: CYSTOSCOPY/RETROGRADE pyelogram/digital URETEROSCOPY/STONE EXTRACTION WITH BASKET, left stent removal;  Surgeon: Molli Hazard, MD;  Location: [facility];  Service: Urology;  Laterality: Left;  . ESOPHAGOGASTRODUODENOSCOPY  2008   FUX:NATFT hiatal hernia./Normal esophagus without evidence of Barrett's mass, stricture, erosion or ulceration./Normal duodenal bulb and second portion of the duodenum./Diffuse erythema in the body and the antrum with occasional erosion.  Biopsies obtained via cold forceps to evaluate for H. pylori gastritis  . FRACTURE SURGERY  1999   right clavicle  . HOLMIUM LASER APPLICATION Left 10/31/2200   Procedure: HOLMIUM LASER APPLICATION;  Surgeon: Molli Hazard, MD;  Location: [facility];  Service: Urology;  Laterality: Left;  . KNEE ARTHROSCOPY  10/04/2010,  right knee arthroscopy, dr Theda Sers  . LEFT HEART CATH AND CORONARY ANGIOGRAPHY N/A 05/08/2018   Procedure: LEFT HEART CATH AND CORONARY ANGIOGRAPHY;  Surgeon: Troy Sine, MD;  Location: [place] [facility];  Service: Cardiovascular;  Laterality: N/A;  . left knee arthroscopic surgery  1999  . Left salphingectomy secondary to ectopic pregnancy  1991  . PARTIAL HYSTERECTOMY  1991  . RIGHT HEART CATH N/A 07/07/2019   Procedure: RIGHT HEART CATH;  Surgeon: Leonie Man, MD;  Location: [place] [facility];  Service: Cardiovascular;  Laterality: N/A;  . rotary cuff   Right 05/14/2014   [facility]  . SPINE SURGERY N/A    Phreesia 06/02/2020  . UPPER GASTROINTESTINAL ENDOSCOPY  2008 abd pain   H. pylori gastritis  . UPPER GASTROINTESTINAL ENDOSCOPY  1996 [facility] [place]   PUD    Social History   Socioeconomic History  . Marital status: Single    Spouse name: Not on file  . Number of children: 1  . Years of education: 86  . Highest education level: 12th grade  Occupational History  . Occupation: Disable   Tobacco Use  . Smoking status: Never Smoker  . Smokeless tobacco: Never Used  Vaping Use  . Vaping Use: Never used  Substance and Sexual Activity  . Alcohol use: No  . Drug use: No  . Sexual activity: Not Currently    Partners: Male    Birth control/protection: Surgical    Comment: hyst  Other Topics Concern  . Not on file  Social History Narrative   Lives alone    Social Determinants of Health   Financial Resource Strain: Low Risk   . Difficulty of Paying Living Expenses: Not hard at all  Food Insecurity: No Food Insecurity  . Worried About Charity fundraiser in the Last Year: Never true  . Ran Out of Food in the Last Year: Never true  Transportation Needs: No Transportation Needs  . Lack of Transportation (Medical): No  . Lack of Transportation (Non-Medical): No  Physical Activity: Sufficiently Active  . Days of Exercise per Week: 3 days  . Minutes of Exercise per Session: 60 min  Stress: No Stress Concern Present  . Feeling of Stress : Not at all  Social Connections: Socially Isolated  . Frequency of Communication with Friends and Family: More than three times a week  . Frequency of Social Gatherings with Friends and Family: More than three times a week  . Attends Religious Services: Never  . Active Member of Clubs or Organizations: No  . Attends Archivist Meetings: Never  . Marital Status: Divorced    Family History  Problem Relation Age of Onset  . Heart disease Mother   . Hypertension Mother   . Cancer  Mother        Cervical   . Asthma Mother   . Heart attack Father   . Cancer Father        Prostate  . Colon cancer Father        DECEASED AGE 69  . Colon polyps Neg Hx     Outpatient Encounter Medications as of 09/20/2020  Medication Sig  . albuterol (VENTOLIN HFA) 108 (90 Base) MCG/ACT inhaler Inhale 2 puffs into the lungs every 6 (six) hours as needed for wheezing or shortness of breath.  Marland Kitchen amLODipine (NORVASC) 10 MG tablet TAKE 1 TABLET BY MOUTH EVERY DAY  . betamethasone, augmented, (DIPROLENE) 0.05 % lotion Apply 1 application topically 2 (  two) times daily.  . blood glucose meter kit and supplies Dispense based on patient and insurance preference. Once daily testing dx. e11.9  . calcipotriene (DOVONOX) 0.005 % ointment Apply 1 application topically daily.   . clobetasol cream (TEMOVATE) 4.43 % Apply 1 application topically 2 (two) times daily.  . cloNIDine (CATAPRES - DOSED IN MG/24 HR) 0.3 mg/24hr patch APPLY 1 PATCH TO SKIN ONCE A WEEK  . cloNIDine (CATAPRES) 0.3 MG tablet TAKE 1 TABLET (0.3 MG TOTAL) BY MOUTH AT BEDTIME.  . cyclobenzaprine (FLEXERIL) 10 MG tablet TAKE 1 TABLET BY MOUTH THREE TIMES A DAY  . diazepam (VALIUM) 5 MG tablet Take one tablet by mouth three times daily for back spasm  . DULoxetine (CYMBALTA) 60 MG capsule TAKE 1 CAPSULE BY MOUTH TWICE A DAY  . ergocalciferol (VITAMIN D2) 1.25 MG (50000 UT) capsule Take 1 capsule (50,000 Units total) by mouth once a week. One capsule once weekly  . esomeprazole (NEXIUM) 40 MG capsule TAKE 1 CAPSULE BY MOUTH EVERY DAY  . fluticasone (FLONASE) 50 MCG/ACT nasal spray Place 2 sprays into both nostrils daily.  . furosemide (LASIX) 40 MG tablet TAKE 1 TABLET BY MOUTH EVERY DAY  . gabapentin (NEURONTIN) 800 MG tablet TAKE 1 TABLET BY MOUTH FOUR TIMES A DAY  . glipiZIDE (GLUCOTROL XL) 10 MG 24 hr tablet TAKE 1 TABLET BY MOUTH EVERY DAY WITH BREAKFAST  . hydrOXYzine (ATARAX/VISTARIL) 10 MG tablet Take 10 mg by mouth in the  morning, at noon, and at bedtime.  . Insulin Lispro Prot & Lispro (HUMALOG MIX 75/25 KWIKPEN) (75-25) 100 UNIT/ML Kwikpen Inject 60 Units into the skin 2 (two) times daily with a meal.  . Insulin Pen Needle (B-D UF III MINI PEN NEEDLES) 31G X 5 MM MISC 1 each by Other route 2 (two) times daily. as directed  . ipratropium-albuterol (DUONEB) 0.5-2.5 (3) MG/3ML SOLN Take 3 mLs by nebulization every 6 (six) hours as needed.  Marland Kitchen KLOR-CON M20 20 MEQ tablet TAKE 1 TABLET BY MOUTH TWICE A DAY  . Lancets (ONETOUCH DELICA PLUS [id number]) MISC TEST ONCE DAILY  . levothyroxine (SYNTHROID) 75 MCG tablet TAKE 1 TABLET BY MOUTH EVERY DAY  . loratadine (CLARITIN) 10 MG tablet Take 1 tablet (10 mg total) by mouth daily.  . Magnesium Oxide 400 MG CAPS Take one  . meclizine (ANTIVERT) 25 MG tablet Take 25 mg by mouth in the morning, at noon, and at bedtime.  . metFORMIN (GLUCOPHAGE) 500 MG tablet TAKE 1 TABLET (500 MG TOTAL) BY MOUTH 2 (TWO) TIMES DAILY WITH A MEAL.  . metoprolol tartrate (LOPRESSOR) 25 MG tablet TAKE 1 TABLET BY MOUTH TWICE A DAY  . nitroGLYCERIN (NITROSTAT) 0.4 MG SL tablet Place 1 tablet (0.4 mg total) under the tongue every 5 (five) minutes as needed for chest pain.  Marland Kitchen ondansetron (ZOFRAN) 4 MG tablet Take 1 tablet (4 mg total) by mouth every 8 (eight) hours as needed for nausea or vomiting.  Glory Rosebush VERIO test strip USE AS INSTRUCTED FOUR TIMES DAILY TESTING DX E11.65  . oxyCODONE (OXYCONTIN) 40 mg 12 hr tablet Take 1 tablet (40 mg total) by mouth every 12 (twelve) hours.  Derrill Memo ON 10/11/2020] oxyCODONE (OXYCONTIN) 40 mg 12 hr tablet Take 1 tablet (40 mg total) by mouth every 12 (twelve) hours.  Derrill Memo ON 11/10/2020] oxyCODONE (OXYCONTIN) 40 mg 12 hr tablet Take 1 tablet (40 mg total) by mouth every 12 (twelve) hours.  . rosuvastatin (CRESTOR) 5 MG tablet  Take one tablet by mouth three times weekly, on Monday, Wednesday and Friday  . spironolactone (ALDACTONE) 25 MG tablet Take 1 tablet  (25 mg total) by mouth daily.  . SYMBICORT 160-4.5 MCG/ACT inhaler TAKE 2 PUFFS BY MOUTH TWICE A DAY  . UNABLE TO FIND Sharps container 6Y or PRN  . zolpidem (AMBIEN) 10 MG tablet Take 1 tablet (10 mg total) by mouth at bedtime.  . [DISCONTINUED] benzonatate (TESSALON PERLES) 100 MG capsule Take 1 capsule (100 mg total) by mouth 2 (two) times daily as needed for cough. (Patient not taking: Reported on 09/20/2020)  . [DISCONTINUED] fluconazole (DIFLUCAN) 150 MG tablet Take one tablet by mouth once daily, as needed, for vaginal itch associated with antibiotic use (Patient not taking: Reported on 09/20/2020)  . [DISCONTINUED] Insulin Lispro Prot & Lispro (HUMALOG MIX 75/25 KWIKPEN) (75-25) 100 UNIT/ML Kwikpen Inject 65 Units into the skin 2 (two) times daily with a meal.   No facility-administered encounter medications on file as of 09/20/2020.    ALLERGIES: Allergies  Allergen Reactions  . Latex Itching and Rash  . Morphine Other (See Comments)    Reaction with esophagus, unable to swallow.   . Morphine And Related Shortness Of Breath and Swelling  . Other Itching, Shortness Of Breath and Swelling  . Ace Inhibitors Cough  . Aspirin Other (See Comments)    Reaction with esophagus, unable to swallow.   . Losartan Cough  . Tapentadol Other (See Comments)    Nausea, increased sleepiness, h/a  . Tomato Other (See Comments)    Acid reflux due to acid in tomato  . Adhesive [Tape] Rash    VACCINATION STATUS: Immunization History  Administered Date(s) Administered  . Influenza Split 03/30/2011  . Influenza Whole 03/21/2005, 02/09/2006  . Influenza,inj,Quad PF,6+ Mos 02/11/2014, 07/27/2015, 03/09/2016, 01/29/2017, 02/07/2018, 01/30/2019, 02/11/2020  . Influenza-Unspecified 01/29/2017  . PFIZER(Purple Top)SARS-COV-2 Vaccination 07/22/2019, 08/12/2019, 03/05/2020  . Pneumococcal Polysaccharide-23 04/17/2006, 09/14/2015  . Td 04/17/2006    Diabetes She presents for her follow-up diabetic  visit. She has type 2 diabetes mellitus. Her disease course has been worsening. There are no hypoglycemic associated symptoms. Pertinent negatives for hypoglycemia include no confusion, headaches, pallor or seizures. Associated symptoms include fatigue, polydipsia and polyuria. Pertinent negatives for diabetes include no blurred vision, no chest pain and no polyphagia. There are no hypoglycemic complications. Symptoms are stable. Diabetic complications include heart disease, nephropathy and peripheral neuropathy. Risk factors for coronary artery disease include dyslipidemia, diabetes mellitus, family history, hypertension, obesity, sedentary lifestyle, tobacco exposure and post-menopausal. Current diabetic treatment includes insulin injections and oral agent (dual therapy). She is compliant with treatment most of the time. Her weight is fluctuating minimally. She is following a generally unhealthy (Attempting to make better changes in her diet) diet. When asked about meal planning, she reported none. She has had a previous visit with a dietitian. She never participates in exercise. Her home blood glucose trend is increasing steadily. Her breakfast blood glucose range is generally 140-180 mg/dl. Her bedtime blood glucose range is generally 180-200 mg/dl. (She presents today with her meter and logs showing above target fasting and postprandial glycemic profile.  Her POCT A1c today is 10%, increasing from previous visit of 9.1%.  She did have a cold in between visits in which her sugars did elevate quite a bit.  She denies any significant hypoglycemia.  Analysis of her meter shows 7-day average of 189, 14-day average of 201; 30-day average of 210; 90-day average of 203.) An ACE  inhibitor/angiotensin II receptor blocker is contraindicated. She does not see a podiatrist.Eye exam is not current.  Hyperlipidemia This is a chronic problem. The current episode started more than 1 year ago. The problem is uncontrolled.  Recent lipid tests were reviewed and are high. Exacerbating diseases include chronic renal disease, diabetes, hypothyroidism and obesity. Factors aggravating her hyperlipidemia include beta blockers. Pertinent negatives include no chest pain, myalgias or shortness of breath. Current antihyperlipidemic treatment includes statins. The current treatment provides mild improvement of lipids. Compliance problems include adherence to diet and adherence to exercise.  Risk factors for coronary artery disease include diabetes mellitus, dyslipidemia, hypertension, obesity and a sedentary lifestyle.  Hypertension This is a chronic problem. The current episode started more than 1 year ago. The problem has been gradually improving since onset. The problem is controlled. Associated symptoms include peripheral edema. Pertinent negatives include no blurred vision, chest pain, headaches, palpitations or shortness of breath. Agents associated with hypertension include thyroid hormones. Risk factors for coronary artery disease include family history, dyslipidemia, diabetes mellitus, obesity, sedentary lifestyle and post-menopausal state. Past treatments include calcium channel blockers, diuretics and beta blockers. The current treatment provides moderate improvement. Compliance problems include diet and exercise.  Hypertensive end-organ damage includes kidney disease, CAD/MI and heart failure. Identifiable causes of hypertension include chronic renal disease and sleep apnea.    Review of systems  Constitutional: + steady body weight,  current Body mass index is 57.5 kg/m. , + fatigue, no subjective hyperthermia, no subjective hypothermia Eyes: no blurry vision, no xerophthalmia ENT: no sore throat, no nodules palpated in throat, no dysphagia/odynophagia, no hoarseness Cardiovascular: no chest pain, no shortness of breath, no palpitations, no leg swelling Respiratory: no cough, + shortness of breath, on oxygen via [place]  today Gastrointestinal: no nausea/vomiting/diarrhea Musculoskeletal: no muscle/joint aches, walks with cane d/t hx of falls Skin: no rashes, no hyperemia Neurological: no tremors, no numbness, no tingling, no dizziness Psychiatric: no depression, no anxiety    Objective:    BP 124/78   Pulse 67   Ht $R'5\' 4"'Kc$  (1.626 m)   Wt (!) 335 lb (152 kg)   BMI 57.50 kg/m   Wt Readings from Last 3 Encounters:  09/20/20 (!) 335 lb (152 kg)  09/20/20 (!) 335 lb (152 kg)  08/30/20 (!) 353 lb (160.1 kg)    BP Readings from Last 3 Encounters:  09/20/20 124/78  09/20/20 124/82  08/26/20 (!) 165/84    Physical Exam- Limited  Constitutional:  Body mass index is 57.5 kg/m. , not in acute distress, normal state of mind Eyes:  EOMI, no exophthalmos Neck: Supple Cardiovascular: RRR, no murmurs, rubs, or gallops, no edema Respiratory:  Dyspnea with exertion (on O2) , no crackles, rales, rhonchi, or wheezing Musculoskeletal: no gross deformities, strength intact in all four extremities, no gross restriction of joint movements, walks with cane Skin:  no rashes, no hyperemia Neurological: no tremor with outstretched hands   CMP ( most recent) CMP     Component Value Date/Time   NA 141 05/06/2020 1201   K 4.4 05/06/2020 1201   CL 100 05/06/2020 1201   CO2 25 05/06/2020 1201   GLUCOSE 76 05/06/2020 1201   GLUCOSE 316 (H) 02/11/2020 1111   BUN 17 05/06/2020 1201   CREATININE 0.90 05/06/2020 1201   CREATININE 0.91 02/11/2020 1111   CALCIUM 9.9 05/06/2020 1201   PROT 8.3 05/06/2020 1201   ALBUMIN 4.3 05/06/2020 1201   AST 30 05/06/2020 1201   ALT 27 05/06/2020  1201   ALKPHOS 102 05/06/2020 1201   BILITOT 0.2 05/06/2020 1201   GFRNONAA 70 05/06/2020 1201   GFRNONAA 69 02/11/2020 1111   GFRAA 80 05/06/2020 1201   GFRAA 80 02/11/2020 1111    Diabetic Labs (most recent): Lab Results  Component Value Date   HGBA1C 10.0 (A) 09/20/2020   HGBA1C 9.1 (A) 06/22/2020   HGBA1C 12.1 (A)  03/22/2020     Lipid Panel ( most recent) Lipid Panel     Component Value Date/Time   CHOL 197 05/06/2020 1201   TRIG 164 (H) 05/06/2020 1201   HDL 44 05/06/2020 1201   CHOLHDL 4.5 (H) 05/06/2020 1201   CHOLHDL 4.0 11/10/2019 1303   VLDL 16 05/06/2018 0335   LDLCALC 124 (H) 05/06/2020 1201   LDLCALC 97 11/10/2019 1303      Assessment & Plan:   1) Uncontrolled type 2 diabetes mellitus with hyperglycemia ([place])  - Angela [name] has currently uncontrolled symptomatic type 2 DM since  60 years of age.  She presents today with her meter and logs showing above target fasting and postprandial glycemic profile.  Her POCT A1c today is 10%, increasing from previous visit of 9.1%.  She did have a cold in between visits in which her sugars did elevate quite a bit.  She denies any significant hypoglycemia.  Analysis of her meter shows 7-day average of 189, 14-day average of 201; 30-day average of 210; 90-day average of 203.   Recent labs reviewed.  -her diabetes is complicated by coronary artery disease, CHF, obesity/sedentary life and she remains at a high risk for more acute and chronic complications which include CAD, CVA, CKD, retinopathy, and neuropathy. These are all discussed in detail with her.  - Nutritional counseling repeated at each appointment due to patients tendency to fall back in to old habits.  - The patient admits there is a room for improvement in their diet and drink choices. -  Suggestion is made for the patient to avoid simple carbohydrates from their diet including Cakes, Sweet Desserts / Pastries, Ice Cream, Soda (diet and regular), Sweet Tea, Candies, Chips, Cookies, Sweet Pastries, Store Bought Juices, Alcohol in Excess of 1-2 drinks a day, Artificial Sweeteners, Coffee Creamer, and "Sugar-free" Products. This will help patient to have stable blood glucose profile and potentially avoid unintended weight gain.   - I encouraged the patient to switch to unprocessed or  minimally processed complex starch and increased protein intake (animal or plant source), fruits, and vegetables.   - Patient is advised to stick to a routine mealtimes to eat 3 meals a day and avoid unnecessary snacks (to snack only to correct hypoglycemia).  - she has previously been scheduled with Jearld Fenton, RDN, CDE for individualized diabetes education.  - I have approached her with the following individualized plan to manage diabetes and patient agrees:   -Given her presentation with chronic glycemic burden, she will likely require multiple daily injections of insulin in order for her to achieve control of diabetes to target.  -She will tolerate increase in her Humalog 75/25 to 60 units with breakfast and supper if glucose is above 90 and she is eating.  She is also advised to continue Metformin 500 mg p.o. twice daily with meals and Glipizide 10 mg XL daily with breakfast.  -She is encouraged to consistently monitor glucose 3 times daily, before injecting insulin and before bed, and to call the clinic if she has readings less than 70 or greater than 300 for  3 tests in a row.  - she is warned not to take insulin without proper monitoring per orders.  2) Blood Pressure /Hypertension: Her blood pressure is controlled to target.  She is advised to continue Norvasc 10 mg po daily, Clonidine 0.3 mg patch once weekly, Clonidine 0.3 mg po daily at bedtime, Lasix 40 mg po daily, and Metoprolol 25 mg po twice daily.  3) Lipids/Hyperlipidemia:  Her most recent lipid panel from 05/06/20 shows uncontrolled LDL at 124 (worsening) and elevated triglycerides of 164 (improving).  She was recently started on low dose Crestor 5 mg po daily at bedtime by her PCP.  She is advised to continue.  4)  Weight/Diet:  Her Body mass index is 57.5 kg/m. - clearly complicating her diabetes care.  She is a candidate for modest weight loss.  She has multiple comorbid situations complicating her chance of bariatric  surgery.  I discussed with her the fact that loss of 5 - 10% of her  current body weight will have the most impact on her diabetes management.  CDE Consult will be initiated . Exercise, and detailed carbohydrates information provided  -  detailed on discharge instructions.  5) Hypothyroidism:  -Her previsit thyroid function tests are consistent with appropriate hormone replacement.  She reports she has been taking her medication along with her other meds which can lead to malabsorption of her thyroid hormone.  She is advised to continue Levothyroxine 75 mcg po daily before breakfast.  Will recheck thyroid function tests prior to next visit.   - We discussed about the correct intake of her thyroid hormone, on empty stomach at fasting, with water, separated by at least 30 minutes from breakfast and other medications,  and separated by more than 4 hours from calcium, iron, multivitamins, acid reflux medications (PPIs). -Patient is made aware of the fact that thyroid hormone replacement is needed for life, dose to be adjusted by periodic monitoring of thyroid function tests.  6) Chronic Care/Health Maintenance: -she is not on ACE/ARB (intolerant to both) is on statin medications and is encouraged to initiate and continue to follow up with Ophthalmology, Dentist,  Podiatrist at least yearly or according to recommendations, and advised to  stay away from smoking. I have recommended yearly flu vaccine and pneumonia vaccine at least every 5 years; moderate intensity exercise for up to 150 minutes weekly; and  sleep for at least 7 hours a day.  - she is advised to maintain close follow up with Fayrene Helper, MD for primary care needs, as well as her other providers for optimal and coordinated care.    I spent 43 minutes in the care of the patient today including review of labs from [facility], Lipids, Thyroid Function, Hematology (current and previous including abstractions from other facilities); face-to-face  time discussing  her blood glucose readings/logs, discussing hypoglycemia and hyperglycemia episodes and symptoms, medications doses, her options of short and long term treatment based on the latest standards of care / guidelines;  discussion about incorporating lifestyle medicine;  and documenting the encounter.    Please refer to Patient Instructions for Blood Glucose Monitoring and Insulin/Medications Dosing Guide"  in media tab for additional information. Please  also refer to " Patient Self Inventory" in the Media  tab for reviewed elements of pertinent patient history.  Angela [name] participated in the discussions, expressed understanding, and voiced agreement with the above plans.  All questions were answered to her satisfaction. she is encouraged to contact clinic should she  have any questions or concerns prior to her return visit.  Follow up plan: - Return in about 3 months (around 12/21/2020) for Diabetes F/U- A1c and UM in office, Thyroid follow up, Previsit labs, Bring meter and logs.  Rayetta Pigg, [facility] [address] [place],  [address] Phone: [phone] Fax: [phone]   09/20/2020, 3:43 PM

## 2020-09-20 NOTE — Assessment & Plan Note (Signed)
Controlled, no change in medication DASH diet and commitment to daily physical activity for a minimum of 30 minutes discussed and encouraged, as a part of hypertension management. The importance of attaining a healthy weight is also discussed.  BP/Weight 09/20/2020 09/20/2020 08/30/2020 08/26/2020 07/15/2020 9/45/8592 12/31/4460  Systolic BP 863 817 - 711 657 903 833  Diastolic BP 78 82 - 84 76 73 94  Wt. (Lbs) 335 335 353 334 330 327 310  BMI 57.5 57.5 60.59 57.33 56.64 56.13 53.21
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
## 2020-09-22 ENCOUNTER — Telehealth (INDEPENDENT_AMBULATORY_CARE_PROVIDER_SITE_OTHER): Payer: Medicare Other | Admitting: Licensed Clinical Social Worker

## 2020-09-22 ENCOUNTER — Other Ambulatory Visit: Payer: Self-pay

## 2020-09-22 DIAGNOSIS — F322 Major depressive disorder, single episode, severe without psychotic features: Secondary | ICD-10-CM

## 2020-09-24 ENCOUNTER — Other Ambulatory Visit: Payer: Self-pay | Admitting: Family Medicine

## 2020-09-24 DIAGNOSIS — M62830 Muscle spasm of back: Secondary | ICD-10-CM

## 2020-09-25 ENCOUNTER — Other Ambulatory Visit: Payer: Self-pay | Admitting: Family Medicine

## 2020-09-28 ENCOUNTER — Ambulatory Visit (HOSPITAL_COMMUNITY)
Admission: RE | Admit: 2020-09-28 | Discharge: 2020-09-28 | Disposition: A | Discharge status: Home / Self Care | Payer: Medicare Other | Source: Ambulatory Visit | Attending: Family Medicine | Admitting: Family Medicine

## 2020-09-28 ENCOUNTER — Other Ambulatory Visit: Payer: Self-pay

## 2020-09-28 ENCOUNTER — Encounter (HOSPITAL_COMMUNITY): Payer: Self-pay

## 2020-09-28 DIAGNOSIS — R928 Other abnormal and inconclusive findings on diagnostic imaging of breast: Secondary | ICD-10-CM

## 2020-09-28 DIAGNOSIS — N6489 Other specified disorders of breast: Secondary | ICD-10-CM | POA: Insufficient documentation

## 2020-09-29 ENCOUNTER — Other Ambulatory Visit (HOSPITAL_COMMUNITY): Payer: Self-pay | Admitting: Family Medicine

## 2020-09-29 DIAGNOSIS — R928 Other abnormal and inconclusive findings on diagnostic imaging of breast: Secondary | ICD-10-CM

## 2020-10-04 DIAGNOSIS — R0609 Other forms of dyspnea: Secondary | ICD-10-CM | POA: Diagnosis not present
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
## 2020-10-15 ENCOUNTER — Other Ambulatory Visit: Payer: Self-pay

## 2020-10-15 ENCOUNTER — Ambulatory Visit (INDEPENDENT_AMBULATORY_CARE_PROVIDER_SITE_OTHER): Payer: Medicare Other | Admitting: Internal Medicine

## 2020-10-15 ENCOUNTER — Encounter: Payer: Self-pay | Admitting: Internal Medicine

## 2020-10-15 VITALS — BP 152/80 | HR 90 | Ht 64.0 in | Wt 328.0 lb

## 2020-10-15 DIAGNOSIS — E7800 Pure hypercholesterolemia, unspecified: Secondary | ICD-10-CM

## 2020-10-15 DIAGNOSIS — I10 Essential (primary) hypertension: Secondary | ICD-10-CM

## 2020-10-15 DIAGNOSIS — I5032 Chronic diastolic (congestive) heart failure: Secondary | ICD-10-CM | POA: Diagnosis not present

## 2020-10-15 DIAGNOSIS — R0600 Dyspnea, unspecified: Secondary | ICD-10-CM

## 2020-10-15 DIAGNOSIS — R0609 Other forms of dyspnea: Secondary | ICD-10-CM

## 2020-10-15 NOTE — Patient Instructions (Signed)
Medication Instructions:   No changes  *If you need a refill on your cardiac medications before your next appointment, please call your pharmacy*   Lab Work:  Not needed   Testing/Procedures: Not needed   Follow-Up: At [facility], you and your health needs are our priority.  As part of our continuing mission to provide you with exceptional heart care, we have created designated Provider Care Teams.  These Care Teams include your primary Cardiologist (physician) and Advanced Practice Providers (APPs -  Physician Assistants and Nurse Practitioners) who all work together to provide you with the care you need, when you need it.     Your next appointment:   6 month(s)  The format for your next appointment:   In Person  Provider:   Cherlynn Kaiser, MD

## 2020-10-17 ENCOUNTER — Other Ambulatory Visit: Payer: Self-pay | Admitting: Family Medicine

## 2020-10-17 DIAGNOSIS — I10 Essential (primary) hypertension: Secondary | ICD-10-CM

## 2020-10-19 ENCOUNTER — Inpatient Hospital Stay (HOSPITAL_COMMUNITY): Admission: RE | Admit: 2020-10-19 | Payer: Medicare Other | Source: Ambulatory Visit

## 2020-10-19 ENCOUNTER — Other Ambulatory Visit (HOSPITAL_COMMUNITY): Payer: Medicare Other

## 2020-10-19 ENCOUNTER — Encounter (HOSPITAL_COMMUNITY): Payer: Medicare Other

## 2020-10-19 ENCOUNTER — Ambulatory Visit (HOSPITAL_COMMUNITY): Payer: Medicare Other

## 2020-10-25 ENCOUNTER — Ambulatory Visit (INDEPENDENT_AMBULATORY_CARE_PROVIDER_SITE_OTHER): Payer: Medicare Other | Admitting: Podiatry

## 2020-10-25 ENCOUNTER — Other Ambulatory Visit: Payer: Self-pay

## 2020-10-25 DIAGNOSIS — Q828 Other specified congenital malformations of skin: Secondary | ICD-10-CM

## 2020-10-25 DIAGNOSIS — M79675 Pain in left toe(s): Secondary | ICD-10-CM | POA: Diagnosis not present

## 2020-10-25 DIAGNOSIS — E1142 Type 2 diabetes mellitus with diabetic polyneuropathy: Secondary | ICD-10-CM | POA: Diagnosis not present

## 2020-10-25 DIAGNOSIS — M79674 Pain in right toe(s): Secondary | ICD-10-CM

## 2020-10-25 DIAGNOSIS — B351 Tinea unguium: Secondary | ICD-10-CM | POA: Diagnosis not present

## 2020-10-26 ENCOUNTER — Encounter: Payer: Self-pay | Admitting: Podiatry

## 2020-10-26 NOTE — Progress Notes (Signed)
Subjective: Angela [name] is a pleasant 60 y.o. female patient seen today for at risk diabetic foot care with h/o diabetic neuropathy. She has calluses and  painful thick toenails that are difficult to trim. Pain interferes with ambulation. Aggravating factors include wearing enclosed shoe gear. Pain is relieved with periodic professional debridement.  PCP is Angela Helper, MD. Last visit was: 09/20/2020.  Patient is accompanied by her daughter on today's visit. Angela [name] states she has lost 12 pounds since her last visit.  She reports blood glucose reading of 156 mg/dl today.  Allergies  Allergen Reactions   Latex Itching and Rash   Morphine Other (See Comments)    Reaction with esophagus, unable to swallow.    Morphine And Related Shortness Of Breath and Swelling   Other Itching, Shortness Of Breath and Swelling   Ace Inhibitors Cough   Aspirin Other (See Comments)    Reaction with esophagus, unable to swallow.    Losartan Cough   Tapentadol Other (See Comments)    Nausea, increased sleepiness, h/a   Tomato Other (See Comments)    Acid reflux due to acid in tomato   Adhesive [Tape] Rash    Objective: Physical Exam  General: Angela [name] is a pleasant 60 y.o. African American female, morbidly obese in NAD. AAO x 3.   Vascular:  Capillary refill time to digits immediate b/l. Palpable pedal pulses b/l LE. Pedal hair sparse. Lower extremity skin temperature gradient within normal limits. No pain with calf compression b/l.  Dermatological:  Pedal skin with normal turgor, texture and tone b/l lower extremities No open wounds b/l lower extremities No interdigital macerations b/l lower extremities Toenails 1-5 b/l elongated, discolored, dystrophic, thickened, crumbly with subungual debris and tenderness to dorsal palpation. Porokeratotic lesion(s) submet head 5 left foot and submet head 5 right foot. No erythema, no edema, no drainage, no fluctuance.  Musculoskeletal:   Normal muscle strength 5/5 to all lower extremity muscle groups bilaterally. No pain crepitus or joint limitation noted with ROM b/l. Hallux valgus with bunion deformity noted b/l lower extremities. Utilizes cane for ambulation assistance.  Neurological:  Pt has subjective symptoms of neuropathy. Protective sensation decreased with 10 gram monofilament b/l. Vibratory sensation intact b/l.  Assessment and Plan:  1. Pain due to onychomycosis of toenails of both feet   2. Porokeratosis   3. Diabetic peripheral neuropathy associated with type 2 diabetes mellitus ([place])     -Examined patient. -Patient to continue soft, supportive shoe gear daily. -Toenails 1-5 b/l were debrided in length and girth with sterile nail nippers and dremel without iatrogenic bleeding.  -Painful porokeratotic lesion(s) submet head 5 left foot and submet head 5 right foot pared and enucleated with sterile scalpel blade without incident. Total number of lesions debrided=2. -Patient to report any pedal injuries to medical professional immediately. -Patient/POA to call should there be question/concern in the interim.  Return in about 3 months (around 01/25/2021).  Marzetta Board, DPM

## 2020-11-02 NOTE — Progress Notes (Signed)
Patient in office today and seen by EJ with [facility]. Patient tried on diabetic shoes and was satisfied with the fit. Patient was educated on the break-in process at this time. Patient advised to call the office with any questions, comments, or concerns. Patient verbalized understanding.  

## 2020-11-03 ENCOUNTER — Ambulatory Visit (HOSPITAL_COMMUNITY)
Admission: RE | Admit: 2020-11-03 | Discharge: 2020-11-03 | Disposition: A | Discharge status: Home / Self Care | Payer: Medicare Other | Source: Ambulatory Visit | Attending: Family Medicine | Admitting: Family Medicine

## 2020-11-03 ENCOUNTER — Encounter (HOSPITAL_COMMUNITY): Payer: Medicare Other

## 2020-11-03 DIAGNOSIS — N6489 Other specified disorders of breast: Secondary | ICD-10-CM | POA: Insufficient documentation

## 2020-11-03 DIAGNOSIS — R0609 Other forms of dyspnea: Secondary | ICD-10-CM | POA: Diagnosis not present

## 2020-11-03 DIAGNOSIS — R922 Inconclusive mammogram: Secondary | ICD-10-CM | POA: Diagnosis not present

## 2020-11-03 DIAGNOSIS — R928 Other abnormal and inconclusive findings on diagnostic imaging of breast: Secondary | ICD-10-CM | POA: Diagnosis not present

## 2020-11-04 ENCOUNTER — Other Ambulatory Visit (HOSPITAL_COMMUNITY): Payer: Self-pay | Admitting: Family Medicine

## 2020-11-04 DIAGNOSIS — R928 Other abnormal and inconclusive findings on diagnostic imaging of breast: Secondary | ICD-10-CM

## 2020-11-09 ENCOUNTER — Other Ambulatory Visit: Payer: Self-pay

## 2020-11-09 ENCOUNTER — Ambulatory Visit (HOSPITAL_COMMUNITY)
Admission: RE | Admit: 2020-11-09 | Discharge: 2020-11-09 | Disposition: A | Discharge status: Home / Self Care | Payer: Medicare Other | Source: Ambulatory Visit | Attending: Family Medicine | Admitting: Family Medicine

## 2020-11-09 DIAGNOSIS — N6002 Solitary cyst of left breast: Secondary | ICD-10-CM | POA: Diagnosis not present

## 2020-11-09 DIAGNOSIS — R928 Other abnormal and inconclusive findings on diagnostic imaging of breast: Secondary | ICD-10-CM | POA: Insufficient documentation

## 2020-11-16 ENCOUNTER — Other Ambulatory Visit (HOSPITAL_COMMUNITY): Payer: Medicare Other

## 2020-11-16 ENCOUNTER — Encounter (HOSPITAL_COMMUNITY): Payer: Medicare Other

## 2020-11-18 ENCOUNTER — Telehealth (INDEPENDENT_AMBULATORY_CARE_PROVIDER_SITE_OTHER): Payer: Medicare Other | Admitting: Licensed Clinical Social Worker

## 2020-11-18 ENCOUNTER — Other Ambulatory Visit: Payer: Self-pay

## 2020-11-18 DIAGNOSIS — F4310 Post-traumatic stress disorder, unspecified: Secondary | ICD-10-CM

## 2020-11-19 DIAGNOSIS — Z23 Encounter for immunization: Secondary | ICD-10-CM | POA: Diagnosis not present

## 2020-11-22 ENCOUNTER — Other Ambulatory Visit: Payer: Self-pay | Admitting: Family Medicine

## 2020-11-23 ENCOUNTER — Other Ambulatory Visit: Payer: Self-pay | Admitting: Family Medicine

## 2020-11-24 ENCOUNTER — Telehealth: Payer: Self-pay | Admitting: Licensed Clinical Social Worker

## 2020-11-24 DIAGNOSIS — E559 Vitamin D deficiency, unspecified: Secondary | ICD-10-CM | POA: Diagnosis not present

## 2020-11-24 DIAGNOSIS — E1165 Type 2 diabetes mellitus with hyperglycemia: Secondary | ICD-10-CM | POA: Diagnosis not present

## 2020-11-24 DIAGNOSIS — F4310 Post-traumatic stress disorder, unspecified: Secondary | ICD-10-CM

## 2020-11-24 DIAGNOSIS — R748 Abnormal levels of other serum enzymes: Secondary | ICD-10-CM | POA: Diagnosis not present

## 2020-11-24 DIAGNOSIS — E039 Hypothyroidism, unspecified: Secondary | ICD-10-CM | POA: Diagnosis not present

## 2020-11-24 DIAGNOSIS — F322 Major depressive disorder, single episode, severe without psychotic features: Secondary | ICD-10-CM

## 2020-11-24 NOTE — BH Specialist Note (Signed)
Virtual Behavioral Health Treatment Plan Team Note  MRN: [id number] NAME: Angela [name]  DATE: 11/24/20  Start time:  415p End time:  420p Total time:  5 min  Total number of Virtual [place] Treatment Team Plan encounters: 2/4  Treatment Team Attendees: Royal Piedra, LCSW & Dr. Modesta Messing, Psychiatrist  Diagnoses:    ICD-10-CM   1. PTSD (post-traumatic stress disorder)  F43.10     2. Depression, [place], single episode, severe (HCC)  F32.2       Goals, Interventions and Follow-up Plan Goals: Increase healthy adjustment to current life circumstances Interventions: Motivational Interviewing Mindfulness or Relaxation Training Medication Management Recommendations: consider starting Bupriopon 150mg  daily and continue Duloxetine at the current dose Follow-up Plan: Weekly VBH sessions  History of the present illness Presenting Problem/Current Symptoms: continued symptoms of her diagnosis  Psychiatric History  Depression: Yes Anxiety: No Mania: No Psychosis: No PTSD symptoms: No  Past Psychiatric History/Hospitalization(s): Hospitalization for psychiatric illness: No Prior Suicide Attempts: No Prior Self-injurious behavior: No  Psychosocial stressors trauma history  Self-harm Behaviors Risk Assessment n/a  Screenings PHQ-9 Assessments:  Depression screen [facility] 2/9 11/30/2020 11/24/2020 09/20/2020  Decreased Interest 3 2 0  Down, Depressed, Hopeless 3 2 2   PHQ - 2 Score 6 4 2   Altered sleeping 3 1 0  Tired, decreased energy 3 2 0  Change in appetite 2 3 2   Feeling bad or failure about yourself  2 1 0  Trouble concentrating 2 1 0  Moving slowly or fidgety/restless 0 0 0  Suicidal thoughts 0 0 0  PHQ-9 Score 18 12 4   Difficult doing work/chores Very difficult Somewhat difficult Not difficult at all  Some recent data might be hidden   GAD-7 Assessments:  GAD 7 : Generalized Anxiety Score 11/30/2020 11/24/2020 10/10/2019  Nervous, Anxious, on Edge 1 2 0  Control/stop worrying 1  1 0  Worry too much - different things 1 1 0  Trouble relaxing 0 1 0  Restless 0 0 0  Easily annoyed or irritable 0 1 0  Afraid - awful might happen 0 1 0  Total GAD 7 Score 3 7 0  Anxiety Difficulty Not difficult at all Somewhat difficult Not difficult at all    Past Medical History Past Medical History:  Diagnosis Date   Allergy    Phreesia 06/02/2020   Anxiety    Arthritis    Asthma    Asthma    Phreesia 04/12/2020   CHF (congestive heart failure) ([place])    Phreesia 04/12/2020   Coronary atherosclerosis of native coronary artery    Mild nonobstructive CAD by cardiac catheterization 2008 - Dr. Terrence Dupont   Degenerative disc disease    Depression    Depression    Phreesia 04/12/2020   Diabetes mellitus without complication ([place])    Phreesia 04/12/2020   Dysrhythmia    Essential hypertension, benign    Folliculitis 7/82/9562   GERD (gastroesophageal reflux disease)    H/O hiatal hernia    Helicobacter pylori gastritis 2008   Hypercholesterolemia    Hypertension    Hyperthyroidism    s/p radiation   Left-sided epistaxis 05/15/2018   Low back pain    Migraine    Nephrolithiasis    Recurring episodes since 2004   Neuromuscular disorder ([place])    Phreesia 04/12/2020   Severe obesity (BMI >= 40) (HCC)    Sleep apnea    STOP BANG SCORE 6no cpap used   Thyroid disease    Phreesia 04/12/2020  Type 2 diabetes mellitus with diabetic neuropathy (HCC)     Vital signs: There were no vitals filed for this visit.  Allergies:  Allergies as of 11/24/2020 - Review Complete 10/26/2020  Allergen Reaction Noted   Latex Itching and Rash 06/02/2020   Morphine Other (See Comments) 07/23/2007   Morphine and related Shortness Of Breath and Swelling 06/02/2020   Other Itching, Shortness Of Breath, and Swelling 04/12/2020   Ace inhibitors Cough 04/01/2013   Aspirin Other (See Comments) 07/23/2007   Losartan Cough 04/01/2013   Tapentadol Other (See Comments) 07/18/2016   Tomato  Other (See Comments) 07/13/2013   Adhesive [tape] Rash 08/30/2012    Medication History Current medications:  Outpatient Encounter Medications as of 11/24/2020  Medication Sig   albuterol (VENTOLIN HFA) 108 (90 Base) MCG/ACT inhaler Inhale 2 puffs into the lungs every 6 (six) hours as needed for wheezing or shortness of breath.   amLODipine (NORVASC) 10 MG tablet TAKE 1 TABLET BY MOUTH EVERY DAY   betamethasone, augmented, (DIPROLENE) 0.05 % lotion Apply 1 application topically 2 (two) times daily.   blood glucose meter kit and supplies Dispense based on patient and insurance preference. Once daily testing dx. e11.9   calcipotriene (DOVONOX) 0.005 % ointment Apply 1 application topically daily.    clobetasol cream (TEMOVATE) 5.91 % Apply 1 application topically 2 (two) times daily.   cloNIDine (CATAPRES) 0.3 MG tablet TAKE 1 TABLET (0.3 MG TOTAL) BY MOUTH AT BEDTIME.   cyclobenzaprine (FLEXERIL) 10 MG tablet TAKE 1 TABLET BY MOUTH THREE TIMES A DAY   diazepam (VALIUM) 5 MG tablet Take one tablet by mouth three times daily for back spasm   DULoxetine (CYMBALTA) 60 MG capsule TAKE 1 CAPSULE BY MOUTH TWICE A DAY   ergocalciferol (VITAMIN D2) 1.25 MG (50000 UT) capsule Take 1 capsule (50,000 Units total) by mouth once a week. One capsule once weekly   fluticasone (FLONASE) 50 MCG/ACT nasal spray Place 2 sprays into both nostrils daily.   furosemide (LASIX) 40 MG tablet TAKE 1 TABLET BY MOUTH EVERY DAY   hydrOXYzine (ATARAX/VISTARIL) 10 MG tablet Take 10 mg by mouth in the morning, at noon, and at bedtime.   Insulin Pen Needle (B-D UF III MINI PEN NEEDLES) 31G X 5 MM MISC 1 each by Other route 2 (two) times daily. as directed   ipratropium-albuterol (DUONEB) 0.5-2.5 (3) MG/3ML SOLN Take 3 mLs by nebulization every 6 (six) hours as needed.   KLOR-CON M20 20 MEQ tablet TAKE 1 TABLET BY MOUTH TWICE A DAY   Lancets (ONETOUCH DELICA PLUS [id number]) MISC TEST ONCE DAILY   levothyroxine (SYNTHROID) 75  MCG tablet TAKE 1 TABLET BY MOUTH EVERY DAY   loratadine (CLARITIN) 10 MG tablet Take 1 tablet (10 mg total) by mouth daily.   Magnesium Oxide 400 MG CAPS Take one   meclizine (ANTIVERT) 25 MG tablet Take 25 mg by mouth in the morning, at noon, and at bedtime.   metoprolol tartrate (LOPRESSOR) 25 MG tablet TAKE 1 TABLET BY MOUTH TWICE A DAY   nitroGLYCERIN (NITROSTAT) 0.4 MG SL tablet Place 1 tablet (0.4 mg total) under the tongue every 5 (five) minutes as needed for chest pain.   ONETOUCH VERIO test strip USE AS INSTRUCTED FOUR TIMES DAILY TESTING DX E11.65   [EXPIRED] oxyCODONE (OXYCONTIN) 40 mg 12 hr tablet Take 1 tablet (40 mg total) by mouth every 12 (twelve) hours.   spironolactone (ALDACTONE) 25 MG tablet TAKE 1 TABLET (25 MG TOTAL) BY MOUTH  DAILY.   SYMBICORT 160-4.5 MCG/ACT inhaler TAKE 2 PUFFS BY MOUTH TWICE A DAY   triamterene-hydrochlorothiazide (MAXZIDE) 75-50 MG tablet Take 1 tablet by mouth daily.   UNABLE TO FIND Sharps container 6Y or PRN   zolpidem (AMBIEN) 10 MG tablet Take 1 tablet (10 mg total) by mouth at bedtime.   [DISCONTINUED] cloNIDine (CATAPRES - DOSED IN MG/24 HR) 0.3 mg/24hr patch APPLY 1 PATCH TO SKIN ONCE A WEEK   [DISCONTINUED] esomeprazole (NEXIUM) 40 MG capsule TAKE 1 CAPSULE BY MOUTH EVERY DAY   [DISCONTINUED] gabapentin (NEURONTIN) 800 MG tablet TAKE 1 TABLET BY MOUTH FOUR TIMES A DAY   [DISCONTINUED] glipiZIDE (GLUCOTROL XL) 10 MG 24 hr tablet TAKE 1 TABLET BY MOUTH EVERY DAY WITH BREAKFAST   [DISCONTINUED] Insulin Lispro Prot & Lispro (HUMALOG MIX 75/25 KWIKPEN) (75-25) 100 UNIT/ML Kwikpen Inject 60 Units into the skin 2 (two) times daily with a meal.   [DISCONTINUED] metFORMIN (GLUCOPHAGE) 500 MG tablet TAKE 1 TABLET (500 MG TOTAL) BY MOUTH 2 (TWO) TIMES DAILY WITH A MEAL.   [DISCONTINUED] ondansetron (ZOFRAN) 4 MG tablet Take 1 tablet (4 mg total) by mouth every 8 (eight) hours as needed for nausea or vomiting.   [DISCONTINUED] rosuvastatin (CRESTOR) 5 MG  tablet TAKE ONE TABLET BY MOUTH THREE TIMES WEEKLY, ON MONDAY, WEDNESDAY AND FRIDAY   No facility-administered encounter medications on file as of 11/24/2020.     Scribe for Treatment Team: Lubertha South, LCSW
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
## 2020-11-25 ENCOUNTER — Encounter: Payer: Self-pay | Admitting: Internal Medicine

## 2020-11-25 ENCOUNTER — Other Ambulatory Visit: Payer: Self-pay

## 2020-11-25 ENCOUNTER — Other Ambulatory Visit: Payer: Self-pay | Admitting: Family Medicine

## 2020-11-25 ENCOUNTER — Telehealth (INDEPENDENT_AMBULATORY_CARE_PROVIDER_SITE_OTHER): Payer: Medicare Other | Admitting: Licensed Clinical Social Worker

## 2020-11-25 DIAGNOSIS — R7989 Other specified abnormal findings of blood chemistry: Secondary | ICD-10-CM

## 2020-11-25 DIAGNOSIS — F4310 Post-traumatic stress disorder, unspecified: Secondary | ICD-10-CM

## 2020-11-25 LAB — CMP14+EGFR
ALT: 272 IU/L — ABNORMAL HIGH (ref 0–32)
AST: 457 IU/L — ABNORMAL HIGH (ref 0–40)
Albumin/Globulin Ratio: 1.4 (ref 1.2–2.2)
Albumin: 4.7 g/dL (ref 3.8–4.9)
Alkaline Phosphatase: 175 IU/L — ABNORMAL HIGH (ref 44–121)
BUN/Creatinine Ratio: 20 (ref 12–28)
BUN: 13 mg/dL (ref 8–27)
Bilirubin Total: 0.4 mg/dL (ref 0.0–1.2)
CO2: 26 mmol/L (ref 20–29)
Calcium: 9.6 mg/dL (ref 8.7–10.3)
Chloride: 98 mmol/L (ref 96–106)
Creatinine, Ser: 0.65 mg/dL (ref 0.57–1.00)
Globulin, Total: 3.3 g/dL (ref 1.5–4.5)
Glucose: 234 mg/dL — ABNORMAL HIGH (ref 65–99)
Potassium: 4.9 mmol/L (ref 3.5–5.2)
Sodium: 142 mmol/L (ref 134–144)
Total Protein: 8 g/dL (ref 6.0–8.5)
eGFR: 101 mL/min/{1.73_m2} (ref >59)

## 2020-11-25 LAB — LIPID PANEL
Chol/HDL Ratio: 3 ratio (ref 0.0–4.4)
Cholesterol, Total: 140 mg/dL (ref 100–199)
HDL: 47 mg/dL (ref >39)
LDL Chol Calc (NIH): 74 mg/dL (ref 0–99)
Triglycerides: 102 mg/dL (ref 0–149)
VLDL Cholesterol Cal: 19 mg/dL (ref 5–40)

## 2020-11-25 LAB — T4, FREE: Free T4: 1.47 ng/dL (ref 0.82–1.77)

## 2020-11-25 LAB — TSH: TSH: 0.953 u[IU]/mL (ref 0.450–4.500)

## 2020-11-25 LAB — VITAMIN D 25 HYDROXY (VIT D DEFICIENCY, FRACTURES): Vit D, 25-Hydroxy: 20.8 ng/mL — ABNORMAL LOW (ref 30.0–100.0)

## 2020-11-25 NOTE — Progress Notes (Signed)
[place] RUQ

## 2020-11-30 LAB — HCV INTERPRETATION

## 2020-11-30 LAB — ACUTE VIRAL HEPATITIS (HAV, HBV, HCV)
HCV Ab: 0.1 s/co ratio (ref 0.0–0.9)
Hep A IgM: NEGATIVE
Hep B C IgM: NEGATIVE
Hepatitis B Surface Ag: NEGATIVE

## 2020-11-30 LAB — SPECIMEN STATUS REPORT

## 2020-11-30 NOTE — Progress Notes (Addendum)
Patient location: home Provider location: home office   Angela [name] Follow Up Assessment  MRN: [id number] NAME: Angela [name] Date: 11/30/20  Total time: 20  Type of Contact: Follow up Telephone Call  Current concerns/stressors: past trauma  Screens/Assessment Tools:  PHQ-9 & GAD-7 Assessments: This is an evidence based assessment tool for depression and anxiety symptoms in adolescents and adults.  Score cut-off points for each section are as follows: 5-9: Mild, 10-14: Moderate, 15+: Severe  PHQ-9 for Depression = 18   GAD-7 for Anxiety = 3   How difficult have these problems made it for you to do your work, take care of things at home, or get along with other people? difficult  Functional Assessment:  Sleep: fair Appetite: fair Coping ability: overwhelmed Patient taking medications as prescribed:  yes  Current medications:  Outpatient Encounter Medications as of 11/25/2020  Medication Sig   albuterol (VENTOLIN HFA) 108 (90 Base) MCG/ACT inhaler Inhale 2 puffs into the lungs every 6 (six) hours as needed for wheezing or shortness of breath.   amLODipine (NORVASC) 10 MG tablet TAKE 1 TABLET BY MOUTH EVERY DAY   betamethasone, augmented, (DIPROLENE) 0.05 % lotion Apply 1 application topically 2 (two) times daily.   blood glucose meter kit and supplies Dispense based on patient and insurance preference. Once daily testing dx. e11.9   calcipotriene (DOVONOX) 0.005 % ointment Apply 1 application topically daily.    clobetasol cream (TEMOVATE) 0.05 % Apply 1 application topically 2 (two) times daily.   cloNIDine (CATAPRES - DOSED IN MG/24 HR) 0.3 mg/24hr patch APPLY 1 PATCH TO SKIN ONCE A WEEK   cloNIDine (CATAPRES) 0.3 MG tablet TAKE 1 TABLET (0.3 MG TOTAL) BY MOUTH AT BEDTIME.   cyclobenzaprine (FLEXERIL) 10 MG tablet TAKE 1 TABLET BY MOUTH THREE TIMES A DAY   diazepam (VALIUM) 5 MG tablet Take one tablet by mouth three times daily for back spasm   DULoxetine  (CYMBALTA) 60 MG capsule TAKE 1 CAPSULE BY MOUTH TWICE A DAY   ergocalciferol (VITAMIN D2) 1.25 MG (50000 UT) capsule Take 1 capsule (50,000 Units total) by mouth once a week. One capsule once weekly   esomeprazole (NEXIUM) 40 MG capsule TAKE 1 CAPSULE BY MOUTH EVERY DAY   fluticasone (FLONASE) 50 MCG/ACT nasal spray Place 2 sprays into both nostrils daily.   furosemide (LASIX) 40 MG tablet TAKE 1 TABLET BY MOUTH EVERY DAY   gabapentin (NEURONTIN) 800 MG tablet TAKE 1 TABLET BY MOUTH FOUR TIMES A DAY   glipiZIDE (GLUCOTROL XL) 10 MG 24 hr tablet TAKE 1 TABLET BY MOUTH EVERY DAY WITH BREAKFAST   hydrOXYzine (ATARAX/VISTARIL) 10 MG tablet Take 10 mg by mouth in the morning, at noon, and at bedtime.   Insulin Lispro Prot & Lispro (HUMALOG MIX 75/25 KWIKPEN) (75-25) 100 UNIT/ML Kwikpen Inject 60 Units into the skin 2 (two) times daily with a meal.   Insulin Pen Needle (B-D UF III MINI PEN NEEDLES) 31G X 5 MM MISC 1 each by Other route 2 (two) times daily. as directed   ipratropium-albuterol (DUONEB) 0.5-2.5 (3) MG/3ML SOLN Take 3 mLs by nebulization every 6 (six) hours as needed.   KLOR-CON M20 20 MEQ tablet TAKE 1 TABLET BY MOUTH TWICE A DAY   Lancets (ONETOUCH DELICA PLUS LANCET33G) MISC TEST ONCE DAILY   levothyroxine (SYNTHROID) 75 MCG tablet TAKE 1 TABLET BY MOUTH EVERY DAY   loratadine (CLARITIN) 10 MG tablet Take 1 tablet (10 mg total) by mouth daily.  Magnesium Oxide 400 MG CAPS Take one   meclizine (ANTIVERT) 25 MG tablet Take 25 mg by mouth in the morning, at noon, and at bedtime.   metFORMIN (GLUCOPHAGE) 500 MG tablet TAKE 1 TABLET (500 MG TOTAL) BY MOUTH 2 (TWO) TIMES DAILY WITH A MEAL.   metoprolol tartrate (LOPRESSOR) 25 MG tablet TAKE 1 TABLET BY MOUTH TWICE A DAY   nitroGLYCERIN (NITROSTAT) 0.4 MG SL tablet Place 1 tablet (0.4 mg total) under the tongue every 5 (five) minutes as needed for chest pain.   ondansetron (ZOFRAN) 4 MG tablet Take 1 tablet (4 mg total) by mouth every 8  (eight) hours as needed for nausea or vomiting.   ONETOUCH VERIO test strip USE AS INSTRUCTED FOUR TIMES DAILY TESTING DX E11.65   oxyCODONE (OXYCONTIN) 40 mg 12 hr tablet Take 1 tablet (40 mg total) by mouth every 12 (twelve) hours.   rosuvastatin (CRESTOR) 5 MG tablet TAKE ONE TABLET BY MOUTH THREE TIMES WEEKLY, ON MONDAY, WEDNESDAY AND FRIDAY   spironolactone (ALDACTONE) 25 MG tablet TAKE 1 TABLET (25 MG TOTAL) BY MOUTH DAILY.   SYMBICORT 160-4.5 MCG/ACT inhaler TAKE 2 PUFFS BY MOUTH TWICE A DAY   triamterene-hydrochlorothiazide (MAXZIDE) 75-50 MG tablet Take 1 tablet by mouth daily.   UNABLE TO FIND Sharps container 6Y or PRN   zolpidem (AMBIEN) 10 MG tablet Take 1 tablet (10 mg total) by mouth at bedtime.   No facility-administered encounter medications on file as of 11/25/2020.    Self-harm and/or Suicidal Behaviors Risk Assessment Self-harm risk factors: no Patient endorses recent self injurious thoughts and/or behaviors: No   Suicide ideations: No plan to harm self or others   Danger to Others Risk Assessment Danger to others risk factors: no Patient endorses recent thoughts of harming others: No    Substance Use Assessment Patient recently consumed alcohol: No  Patient recently used drugs: No  Patient is concerned about dependence or abuse of substances: No    Goals, Interventions and Follow-up Plan Goals: Increase healthy adjustment to current life circumstances Interventions: Motivational Interviewing and Mindfulness or Angela [name] of Angela [name] was present for the session.  She has difficulty with remembering the video session but Writer calls after she does not appear through her Angela [name] visit.  Angela [name] reports that she continues to have difficulty with her emotions after remembering childhood trauma and a conflictual relationship with her older siblings. She has support such as her exhusband and her younger brother.  She has  exhibited the following behaviors: lack of energy, recurrent thoughts of the incident, decreased concentration, poor sleep and poor appetite. She has children that she cares for but finds it difficult to manage lately.  She hides her emotions from her family.  Writer provided her with psychoeducation of her diagnosis and trauma.     Follow-up Plan:  Weekly [facility] session L[place]tha [place], LCSW

## 2020-12-02 ENCOUNTER — Ambulatory Visit (INDEPENDENT_AMBULATORY_CARE_PROVIDER_SITE_OTHER): Payer: Medicare Other | Admitting: Family Medicine

## 2020-12-02 ENCOUNTER — Other Ambulatory Visit: Payer: Self-pay

## 2020-12-02 ENCOUNTER — Encounter: Payer: Self-pay | Admitting: Family Medicine

## 2020-12-02 VITALS — BP 130/80 | HR 82 | Resp 16 | Ht 64.0 in | Wt 325.0 lb

## 2020-12-02 DIAGNOSIS — Z9981 Dependence on supplemental oxygen: Secondary | ICD-10-CM

## 2020-12-02 DIAGNOSIS — I10 Essential (primary) hypertension: Secondary | ICD-10-CM | POA: Diagnosis not present

## 2020-12-02 DIAGNOSIS — R0600 Dyspnea, unspecified: Secondary | ICD-10-CM | POA: Diagnosis not present

## 2020-12-02 DIAGNOSIS — E782 Mixed hyperlipidemia: Secondary | ICD-10-CM

## 2020-12-02 DIAGNOSIS — R0609 Other forms of dyspnea: Secondary | ICD-10-CM

## 2020-12-02 DIAGNOSIS — Z794 Long term (current) use of insulin: Secondary | ICD-10-CM

## 2020-12-02 DIAGNOSIS — E1140 Type 2 diabetes mellitus with diabetic neuropathy, unspecified: Secondary | ICD-10-CM | POA: Diagnosis not present

## 2020-12-02 DIAGNOSIS — F322 Major depressive disorder, single episode, severe without psychotic features: Secondary | ICD-10-CM

## 2020-12-02 DIAGNOSIS — R52 Pain, unspecified: Secondary | ICD-10-CM

## 2020-12-02 NOTE — Progress Notes (Signed)
Angela [name]     MRN: [id number]      DOB: 02-10-1961   HPI Angela [name] is here for follow up and re-evaluation of chronic medical conditions, medication management and review of any available recent lab and radiology data.  Preventive health is updated, specifically  Cancer screening and Immunization.   Questions or concerns regarding consultations or procedures which the PT has had in the interim are  addressed. The PT denies any adverse reactions to current medications since the last visit.  C/o heavy portable oxygen tank and requests smaller tank Denies polyuria, polydipsia, blurred vision , or hypoglycemic episodes.    ROS Denies recent fever or chills. Denies sinus pressure, nasal congestion, ear pain or sore throat. Denies chest congestion, productive cough or wheezing.c/o exertional fatigue andpoor exercise tolerance Denies chest pains, palpitations and leg swelling Denies abdominal pain, nausea, vomiting,diarrhea or constipation.   Denies dysuria, frequency, hesitancy or incontinence. Chronic  joint pain, swelling and limitation in mobility. Denies headaches, seizures, c/o lower ext numbness, or tingling. C/o  depression, anxiety and  insomnia. Denies skin break down or rash.   PE BP 130/80   Pulse 82   Resp 16   Ht '5\' 4"'$  (1.626 m)   Wt (!) 325 lb (147.4 kg)   SpO2 97% Comment: on 2l/min supplemental oxygen at rest  BMI 55.79 kg/m   Patient alert and oriented and in no cardiopulmonary distress.  HEENT: No facial asymmetry, EOMI,     Neck supple .  Chest: Clear to auscultation bilaterally.  CVS: S1, S2 no murmurs, no S3.Regular rate.  ABD: Soft non tender.   Ext: No edema  MS: decreased ROM spine, shoulders, hips and knees.  Skin: Intact, no ulcerations or rash noted.  Psych: Good eye contact, normal affect. Memory intact not anxious or depressed appearing.  CNS: CN 2-12 intact, power,  normal throughout.no focal deficits noted.   Assessment &  Plan  Encounter for pain management The patient's Controlled Substance registry is reviewed and compliance confirmed. Adequacy of  Pain control and level of function is assessed. Medication dosing is adjusted as deemed appropriate. Twelve weeks of medication is prescribed , with a follow up appointment between 11 to 12 weeks .   Essential hypertension, benign Controlled, no change in medication DASH diet and commitment to daily physical activity for a minimum of 30 minutes discussed and encouraged, as a part of hypertension management. The importance of attaining a healthy weight is also discussed.  BP/Weight 12/02/2020 10/15/2020 09/20/2020 09/20/2020 08/30/2020 08/26/2020 [id number]  Systolic BP [id number] 0000000 [id number] A999333 - [id number]  Diastolic BP 80 80 78 82 - 84 76  Wt. (Lbs) 325 328 335 335 353 334 330  BMI 55.79 56.3 57.5 57.5 60.59 57.33 56.64       Morbid obesity (HCC)  Patient re-educated about  the importance of commitment to a  minimum of 150 minutes of exercise per week as able.  The importance of healthy food choices with portion control discussed, as well as eating regularly and within a 12 hour window most days. The need to choose "clean , green" food 50 to 75% of the time is discussed, as well as to make water the primary drink and set a goal of 64 ounces water daily.    Weight /BMI 12/02/2020 10/15/2020 09/20/2020  WEIGHT 325 lb 328 lb 335 lb  HEIGHT '5\' 4"'$  '5\' 4"'$  '5\' 4"'$   BMI 55.79 kg/m2 56.3 kg/m2 57.5 kg/m2  HLD (hyperlipidemia) Hyperlipidemia:Low fat diet discussed and encouraged.   Lipid Panel  Lab Results  Component Value Date   CHOL 140 11/24/2020   HDL 47 11/24/2020   LDLCALC 74 11/24/2020   TRIG 102 11/24/2020   CHOLHDL 3.0 11/24/2020     Controlled, no change in medication   DOE (dyspnea on exertion) Requires supplemental oxygen 24/7. Will send for lighter portable oxygen tank  DM (diabetes mellitus) ([place]) Uncontrolled Angela [name] is reminded of the  importance of commitment to daily physical activity for 30 minutes or more, as able and the need to limit carbohydrate intake to 30 to 60 grams per meal to help with blood sugar control.   The need to take medication as prescribed, test blood sugar as directed, and to call between visits if there is a concern that blood sugar is uncontrolled is also discussed.   Angela [name] is reminded of the importance of daily foot exam, annual eye examination, and good blood sugar, blood pressure and cholesterol control. Managed by endo  Diabetic Labs Latest Ref Rng & Units 11/24/2020 09/20/2020 06/22/2020 05/06/2020 03/22/2020  HbA1c 4.0 - 5.6 % - 10.0(A) 9.1(A) - 12.1(A)  Microalbumin Not Estab. ug/mL - - - - -  Micro/Creat Ratio <30 mcg/mg creat - - - - -  Chol 100 - 199 mg/dL 140 - - 197 -  HDL >39 mg/dL 47 - - 44 -  Calc LDL 0 - 99 mg/dL 74 - - 124(H) -  Triglycerides 0 - 149 mg/dL 102 - - 164(H) -  Creatinine 0.57 - 1.00 mg/dL 0.65 - - 0.90 -  GFR >60.00 mL/min - - - - -   BP/Weight 12/02/2020 10/15/2020 09/20/2020 09/20/2020 08/30/2020 08/26/2020 [id number]  Systolic BP [id number] 0000000 [id number] A999333 - [id number]  Diastolic BP 80 80 78 82 - 84 76  Wt. (Lbs) 325 328 335 335 353 334 330  BMI 55.79 56.3 57.5 57.5 60.59 57.33 56.64   Foot/eye exam completion dates Latest Ref Rng & Units 02/11/2020 12/25/2019  Eye Exam No Retinopathy - No Retinopathy  Foot Form Completion - Done -        Depression, [place], single episode, severe (HCC) Uncorrected and uncontrolled, not suicidal or homicidal Continue medication and therapy  Supplemental oxygen dependent Severe COPD and diastolic heart failure , requires oxygen 24/7, needs lighter portable  oxygen tank, increasingly challenging to carry current tank with her, will order

## 2020-12-02 NOTE — Patient Instructions (Signed)
Annual exam in office with mD in 13 weeks, and chronic pain management, call if you need me sooner  Think about what you will eat, plan ahead. Choose " clean, green, fresh or frozen" over canned, processed or packaged foods which are more sugary, salty and fatty. 70 to 75% of food eaten should be vegetables and fruit. Three meals at set times with snacks allowed between meals, but they must be fruit or vegetables. Aim to eat over a 12 hour period , example 7 am to 7 pm, and STOP after  your last meal of the day. Drink water,generally about 64 ounces per day, no other drink is as healthy. Fruit juice is best enjoyed in a healthy way, by EATING the fruit.   We will prescribe smaller oxygen tank  Careful not to fall  Thanks for choosing [facility], we consider it a privelige to serve you.

## 2020-12-03 ENCOUNTER — Ambulatory Visit (HOSPITAL_COMMUNITY)
Admission: RE | Admit: 2020-12-03 | Discharge: 2020-12-03 | Disposition: A | Discharge status: Home / Self Care | Payer: Medicare Other | Source: Ambulatory Visit | Attending: Family Medicine | Admitting: Family Medicine

## 2020-12-03 DIAGNOSIS — G4733 Obstructive sleep apnea (adult) (pediatric): Secondary | ICD-10-CM | POA: Diagnosis not present

## 2020-12-03 DIAGNOSIS — R7989 Other specified abnormal findings of blood chemistry: Secondary | ICD-10-CM | POA: Diagnosis not present

## 2020-12-04 DIAGNOSIS — R0609 Other forms of dyspnea: Secondary | ICD-10-CM | POA: Diagnosis not present

## 2020-12-06 ENCOUNTER — Other Ambulatory Visit: Payer: Self-pay | Admitting: "Endocrinology

## 2020-12-06 ENCOUNTER — Encounter: Payer: Self-pay | Admitting: Family Medicine

## 2020-12-06 DIAGNOSIS — Z9981 Dependence on supplemental oxygen: Secondary | ICD-10-CM | POA: Insufficient documentation

## 2020-12-06 DIAGNOSIS — E1159 Type 2 diabetes mellitus with other circulatory complications: Secondary | ICD-10-CM

## 2020-12-06 DIAGNOSIS — J9611 Chronic respiratory failure with hypoxia: Secondary | ICD-10-CM | POA: Insufficient documentation

## 2020-12-06 MED ORDER — OXYCODONE HCL ER 40 MG PO T12A: EXTENDED_RELEASE_TABLET | ORAL | 0 refills | Status: AC

## 2020-12-06 NOTE — Assessment & Plan Note (Signed)
  Patient re-educated about  the importance of commitment to a  minimum of 150 minutes of exercise per week as able.  The importance of healthy food choices with portion control discussed, as well as eating regularly and within a 12 hour window most days. The need to choose "clean , green" food 50 to 75% of the time is discussed, as well as to make water the primary drink and set a goal of 64 ounces water daily.    Weight /BMI 12/02/2020 10/15/2020 09/20/2020  WEIGHT 325 lb 328 lb 335 lb  HEIGHT '5\' 4"'$  '5\' 4"'$  '5\' 4"'$   BMI 55.79 kg/m2 56.3 kg/m2 57.5 kg/m2

## 2020-12-06 NOTE — Assessment & Plan Note (Signed)
Severe COPD and diastolic heart failure , requires oxygen 24/7, needs lighter portable  oxygen tank, increasingly challenging to carry current tank with her, will order

## 2020-12-06 NOTE — Assessment & Plan Note (Signed)
Uncontrolled Angela [name] is reminded of the importance of commitment to daily physical activity for 30 minutes or more, as able and the need to limit carbohydrate intake to 30 to 60 grams per meal to help with blood sugar control.   The need to take medication as prescribed, test blood sugar as directed, and to call between visits if there is a concern that blood sugar is uncontrolled is also discussed.   Angela [name] is reminded of the importance of daily foot exam, annual eye examination, and good blood sugar, blood pressure and cholesterol control. Managed by endo  Diabetic Labs Latest Ref Rng & Units 11/24/2020 09/20/2020 06/22/2020 05/06/2020 03/22/2020  HbA1c 4.0 - 5.6 % - 10.0(A) 9.1(A) - 12.1(A)  Microalbumin Not Estab. ug/mL - - - - -  Micro/Creat Ratio <30 mcg/mg creat - - - - -  Chol 100 - 199 mg/dL 140 - - 197 -  HDL >39 mg/dL 47 - - 44 -  Calc LDL 0 - 99 mg/dL 74 - - 124(H) -  Triglycerides 0 - 149 mg/dL 102 - - 164(H) -  Creatinine 0.57 - 1.00 mg/dL 0.65 - - 0.90 -  GFR >60.00 mL/min - - - - -   BP/Weight 12/02/2020 10/15/2020 09/20/2020 09/20/2020 08/30/2020 08/26/2020 [id number]  Systolic BP [id number] 0000000 [id number] A999333 - [id number]  Diastolic BP 80 80 78 82 - 84 76  Wt. (Lbs) 325 328 335 335 353 334 330  BMI 55.79 56.3 57.5 57.5 60.59 57.33 56.64   Foot/eye exam completion dates Latest Ref Rng & Units 02/11/2020 12/25/2019  Eye Exam No Retinopathy - No Retinopathy  Foot Form Completion - Done -

## 2020-12-06 NOTE — Assessment & Plan Note (Signed)
Requires supplemental oxygen 24/7. Will send for lighter portable oxygen tank

## 2020-12-06 NOTE — Assessment & Plan Note (Signed)
Uncorrected and uncontrolled, not suicidal or homicidal Continue medication and therapy

## 2020-12-06 NOTE — Assessment & Plan Note (Signed)
The patient's Controlled Substance registry is reviewed and compliance confirmed. Adequacy of  Pain control and level of function is assessed. Medication dosing is adjusted as deemed appropriate. Twelve weeks of medication is prescribed , with a follow up appointment between 11 to 12 weeks .  

## 2020-12-06 NOTE — Assessment & Plan Note (Signed)
Controlled, no change in medication DASH diet and commitment to daily physical activity for a minimum of 30 minutes discussed and encouraged, as a part of hypertension management. The importance of attaining a healthy weight is also discussed.  BP/Weight 12/02/2020 10/15/2020 09/20/2020 09/20/2020 08/30/2020 08/26/2020 [id number]  Systolic BP [id number] 0000000 A999333 A999333 - [id number]  Diastolic BP 80 80 78 82 - 84 76  Wt. (Lbs) 325 328 335 335 353 334 330  BMI 55.79 56.3 57.5 57.5 60.59 57.33 56.64

## 2020-12-06 NOTE — Assessment & Plan Note (Signed)
Hyperlipidemia:Low fat diet discussed and encouraged.   Lipid Panel  Lab Results  Component Value Date   CHOL 140 11/24/2020   HDL 47 11/24/2020   LDLCALC 74 11/24/2020   TRIG 102 11/24/2020   CHOLHDL 3.0 11/24/2020     Controlled, no change in medication

## 2020-12-09 ENCOUNTER — Other Ambulatory Visit: Payer: Self-pay | Admitting: Family Medicine

## 2020-12-09 ENCOUNTER — Other Ambulatory Visit: Payer: Self-pay | Admitting: Nurse Practitioner

## 2020-12-09 DIAGNOSIS — E1159 Type 2 diabetes mellitus with other circulatory complications: Secondary | ICD-10-CM

## 2020-12-10 ENCOUNTER — Other Ambulatory Visit: Payer: Self-pay | Admitting: Family Medicine

## 2020-12-13 ENCOUNTER — Other Ambulatory Visit: Payer: Self-pay | Admitting: *Deleted

## 2020-12-13 ENCOUNTER — Telehealth: Payer: Self-pay

## 2020-12-13 MED ORDER — GABAPENTIN 800 MG PO TABS: 800.0000 mg | ORAL_TABLET | Freq: Four times a day (QID) | ORAL | 0 refills | Status: DC

## 2020-12-13 NOTE — Telephone Encounter (Signed)
Please refill request --or call the pt to advise why it cant be refilled.

## 2020-12-13 NOTE — Telephone Encounter (Signed)
Patient called need refill, pharmacy not received yet  gabapentin (NEURONTIN) 800 MG tablet   [facility] [place] [facility]

## 2020-12-13 NOTE — Telephone Encounter (Signed)
[facility] to [facility].

## 2020-12-15 ENCOUNTER — Telehealth (INDEPENDENT_AMBULATORY_CARE_PROVIDER_SITE_OTHER): Payer: Medicare Other | Admitting: Licensed Clinical Social Worker

## 2020-12-15 DIAGNOSIS — F322 Major depressive disorder, single episode, severe without psychotic features: Secondary | ICD-10-CM

## 2020-12-15 DIAGNOSIS — F4310 Post-traumatic stress disorder, unspecified: Secondary | ICD-10-CM

## 2020-12-15 NOTE — BH Specialist Note (Signed)
Virtual Behavioral Health Treatment Plan Team Note  MRN: [id number] NAME: Angela [name]  DATE: 12/15/20  Start time:  430p End time:  435p Total time:  5 min  Total number of [facility] Treatment Team Plan encounters: 2/4  Treatment Team Attendees: Royal Piedra, LCSW, Dr. Modesta Messing, Psychiatrist  Diagnoses: No diagnosis found.  Goals, Interventions and Follow-up Plan Goals: Increase healthy adjustment to current life circumstances Interventions: Motivational Interviewing Mindfulness or Relaxation Training Medication Management Recommendations: no changes Follow-up Plan: Weekly VBH session  History of the present illness Presenting Problem/Current Symptoms: Continued symptoms of diagnosis  Psychiatric History  Depression: No Anxiety: No Mania: No Psychosis: No PTSD symptoms: No  Past Psychiatric History/Hospitalization(s): Hospitalization for psychiatric illness: No Prior Suicide Attempts: No Prior Self-injurious behavior: No  Psychosocial stressors  Continued symptoms of diagnosis Self-harm Behaviors Risk Assessment none  Screenings PHQ-9 Assessments:  Depression screen [facility] 2/9 11/30/2020 11/24/2020 09/20/2020  Decreased Interest 3 2 0  Down, Depressed, Hopeless _0 PHQ - 2 Score _1 Altered sleeping 3 1 0  Tired, decreased energy 3 2 0  Change in appetite _2 Feeling bad or failure about yourself  2 1 0  Trouble concentrating 2 1 0  Moving slowly or fidgety/restless 0 0 0  Suicidal thoughts 0 0 0  PHQ-9 Score _3 Difficult doing work/chores Very difficult Somewhat difficult Not difficult at all  Some recent data might be hidden   GAD-7 Assessments:  GAD 7 : Generalized Anxiety Score 11/30/2020 11/24/2020 10/10/2019  Nervous, Anxious, on Edge 1 2 0  Control/stop worrying 1 1 0  Worry too much - different things 1 1 0  Trouble relaxing 0 1 0  Restless 0 0 0  Easily annoyed or irritable 0 1 0  Afraid - awful might happen 0 1 0  Total GAD 7  Score 3 7 0  Anxiety Difficulty Not difficult at all Somewhat difficult Not difficult at all    Past Medical History Past Medical History:  Diagnosis Date   Allergy    [employer]reesia 06/02/2020   Anxiety    Arthritis    Asthma    Asthma    [employer]rees[employer] 04/12/2020   CHF (congestive heart failure) ([place])    [employer]reesia 04/12/2020   Coronary atherosclerosis of native coronary artery    Mild nonobstructive CAD by cardiac catheterization 2008 - Dr. Terrence Dupont   Degenerative disc disease    Depression    Depression    [employer] 04/12/2020   Diabetes mellitus without complication ([place])    [employer]ia 04/12/2020   Dysrhythmia    Essential hypertension, benign    Folliculitis 2/64/1583   GERD (gastroesophageal reflux disease)    H/O hiatal hernia    Helicobacter pylori gastritis 2008   Hypercholesterolemia    Hypertension    Hyperthyroidism    s/p radiation   Left-sided epistaxis 05/15/2018   Low back pain    Migraine    Nephrolithiasis    Recurring episodes since 2004   Neuromuscular disorder ([place])    [employer]ia 04/12/2020   Severe obesity (BMI >= 40) (HCC)    Sleep apnea    STOP BANG SCORE 6no cpap used   Thyroid disease    [employer]reesia 04/12/2020   Type 2 diabetes mellitus with diabetic neuropathy (HCC)     Vital signs: There were no vitals filed for this visit.  Allergies:  Allergies as of 12/15/2020 - Review Complete 12/06/2020  Allergen Reaction Noted  Latex Itching and Rash 06/02/2020   Morphine Other (See Comments) 07/23/2007   Morphine and related Shortness Of Breath and Swelling 06/02/2020   Other Itching, Shortness Of Breath, and Swelling 04/12/2020   Ace inhibitors Cough 04/01/2013   Aspirin Other (See Comments) 07/23/2007   Losartan Cough 04/01/2013   Tapentadol Other (See Comments) 07/18/2016   Tomato Other (See Comments) 07/13/2013   Adhesive [tape] Rash 08/30/2012    Medication History Current medications:  Outpatient Encounter Medications as of 12/15/2020  Medication  Sig   albuterol (VENTOLIN HFA) 108 (90 Base) MCG/ACT inhaler Inhale 2 puffs into the lungs every 6 (six) hours as needed for wheezing or shortness of breath.   amLODipine (NORVASC) 10 MG tablet TAKE 1 TABLET BY MOUTH EVERY DAY   betamethasone, augmented, (DIPROLENE) 0.05 % lotion Apply 1 application topically 2 (two) times daily.   blood glucose meter kit and supplies Dispense based on patient and insurance preference. Once daily testing dx. e11.9   calcipotriene (DOVONOX) 0.005 % ointment Apply 1 application topically daily.    clobetasol cream (TEMOVATE) 3.50 % Apply 1 application topically 2 (two) times daily.   cloNIDine (CATAPRES - DOSED IN MG/24 HR) 0.3 mg/24hr patch APPLY 1 PATCH TO SKIN ONCE A WEEK   cloNIDine (CATAPRES) 0.3 MG tablet TAKE 1 TABLET (0.3 MG TOTAL) BY MOUTH AT BEDTIME.   cyclobenzaprine (FLEXERIL) 10 MG tablet TAKE 1 TABLET BY MOUTH THREE TIMES A DAY   diazepam (VALIUM) 5 MG tablet Take one tablet by mouth three times daily for back spasm   DULoxetine (CYMBALTA) 60 MG capsule TAKE 1 CAPSULE BY MOUTH TWICE A DAY   ergocalciferol (VITAMIN D2) 1.25 MG (50000 UT) capsule Take 1 capsule (50,000 Units total) by mouth once a week. One capsule once weekly   esomeprazole (NEXIUM) 40 MG capsule TAKE 1 CAPSULE BY MOUTH EVERY DAY   fluticasone (FLONASE) 50 MCG/ACT nasal spray Place 2 sprays into both nostrils daily.   furosemide (LASIX) 40 MG tablet TAKE 1 TABLET BY MOUTH EVERY DAY   gabapentin (NEURONTIN) 800 MG tablet Take 1 tablet (800 mg total) by mouth 4 (four) times daily.   glipiZIDE (GLUCOTROL XL) 10 MG 24 hr tablet TAKE 1 TABLET BY MOUTH EVERY DAY WITH BREAKFAST   HUMALOG MIX 75/25 KWIKPEN (75-25) 100 UNIT/ML KwikPen INJECT 60 UNITS INTO THE SKIN 2 (TWO) TIMES DAILY.   hydrOXYzine (ATARAX/VISTARIL) 10 MG tablet Take 10 mg by mouth in the morning, at noon, and at bedtime.   Insulin Pen Needle (B-D UF III MINI PEN NEEDLES) 31G X 5 MM MISC 1 each by Other route 2 (two) times  daily. as directed   ipratropium-albuterol (DUONEB) 0.5-2.5 (3) MG/3ML SOLN Take 3 mLs by nebulization every 6 (six) hours as needed.   KLOR-CON M20 20 MEQ tablet TAKE 1 TABLET BY MOUTH TWICE A DAY   Lancets (ONETOUCH DELICA PLUS KXFGHW29H) MISC TEST ONCE DAILY   levothyroxine (SYNTHROID) 75 MCG tablet TAKE 1 TABLET BY MOUTH EVERY DAY   loratadine (CLARITIN) 10 MG tablet Take 1 tablet (10 mg total) by mouth daily.   Magnesium Oxide 400 MG CAPS Take one   meclizine (ANTIVERT) 25 MG tablet Take 25 mg by mouth in the morning, at noon, and at bedtime.   metFORMIN (GLUCOPHAGE) 500 MG tablet TAKE 1 TABLET BY MOUTH 2 TIMES DAILY WITH A MEAL.   metoprolol tartrate (LOPRESSOR) 25 MG tablet TAKE 1 TABLET BY MOUTH TWICE A DAY   nitroGLYCERIN (NITROSTAT) 0.4 MG SL  tablet Place 1 tablet (0.4 mg total) under the tongue every 5 (five) minutes as needed for chest pain.   ondansetron (ZOFRAN) 4 MG tablet Take 1 tablet (4 mg total) by mouth every 8 (eight) hours as needed for nausea or vomiting.   ONETOUCH VERIO test strip USE AS INSTRUCTED FOUR TIMES DAILY TESTING DX E11.65   oxyCODONE (OXYCONTIN) 40 mg 12 hr tablet Take one tablet by mouth two times daily for chronic back pain   [START ON 01/09/2021] oxyCODONE (OXYCONTIN) 40 mg 12 hr tablet Take one tablet by mouth two times daily for chronic pain   [START ON 02/08/2021] oxyCODONE (OXYCONTIN) 40 mg 12 hr tablet Take one tablet by mouth two times daily for chronic pain   spironolactone (ALDACTONE) 25 MG tablet TAKE 1 TABLET (25 MG TOTAL) BY MOUTH DAILY.   SYMBICORT 160-4.5 MCG/ACT inhaler TAKE 2 PUFFS BY MOUTH TWICE A DAY   triamterene-hydrochlorothiazide (MAXZIDE) 75-50 MG tablet Take 1 tablet by mouth daily.   UNABLE TO FIND Sharps container 6Y or PRN   zolpidem (AMBIEN) 10 MG tablet Take 1 tablet (10 mg total) by mouth at bedtime.   No facility-administered encounter medications on file as of 12/15/2020.     Scribe for Treatment Team: Lubertha South,  LCSW

## 2020-12-17 ENCOUNTER — Other Ambulatory Visit: Payer: Self-pay | Admitting: Family Medicine

## 2020-12-17 ENCOUNTER — Telehealth: Payer: Self-pay

## 2020-12-17 MED ORDER — ZOLPIDEM TARTRATE 10 MG PO TABS: 10.0000 mg | ORAL_TABLET | Freq: Every day | ORAL | 5 refills | Status: DC

## 2020-12-17 NOTE — Telephone Encounter (Signed)
Patient called need med refill zolpidem (AMBIEN) 10 MG tablet [facility]

## 2020-12-17 NOTE — Telephone Encounter (Signed)
Requesting refill of zolpidem

## 2020-12-20 ENCOUNTER — Other Ambulatory Visit: Payer: Self-pay | Admitting: Family Medicine

## 2020-12-21 ENCOUNTER — Ambulatory Visit (INDEPENDENT_AMBULATORY_CARE_PROVIDER_SITE_OTHER): Payer: Medicare Other | Admitting: Nurse Practitioner

## 2020-12-21 ENCOUNTER — Other Ambulatory Visit: Payer: Self-pay

## 2020-12-21 ENCOUNTER — Encounter: Payer: Self-pay | Admitting: Nurse Practitioner

## 2020-12-21 VITALS — BP 148/88 | HR 88 | Ht 64.0 in | Wt 324.0 lb

## 2020-12-21 DIAGNOSIS — E1165 Type 2 diabetes mellitus with hyperglycemia: Secondary | ICD-10-CM

## 2020-12-21 DIAGNOSIS — E782 Mixed hyperlipidemia: Secondary | ICD-10-CM | POA: Diagnosis not present

## 2020-12-21 DIAGNOSIS — E1159 Type 2 diabetes mellitus with other circulatory complications: Secondary | ICD-10-CM

## 2020-12-21 DIAGNOSIS — E559 Vitamin D deficiency, unspecified: Secondary | ICD-10-CM | POA: Diagnosis not present

## 2020-12-21 DIAGNOSIS — I10 Essential (primary) hypertension: Secondary | ICD-10-CM

## 2020-12-21 DIAGNOSIS — E039 Hypothyroidism, unspecified: Secondary | ICD-10-CM | POA: Diagnosis not present

## 2020-12-21 LAB — POCT GLYCOSYLATED HEMOGLOBIN (HGB A1C): Hemoglobin A1C: 8.9 % — AB (ref 4.0–5.6)

## 2020-12-21 MED ORDER — INSULIN LISPRO PROT & LISPRO (75-25 MIX) 100 UNIT/ML KWIKPEN: 65.0000 [IU] | PEN_INJECTOR | Freq: Two times a day (BID) | SUBCUTANEOUS | 3 refills | Status: DC

## 2020-12-21 NOTE — Patient Instructions (Signed)

## 2020-12-21 NOTE — Progress Notes (Signed)
12/21/2020, 10:44 AM                    Endocrinology follow-up note   Subjective:    [name], female    DOB: 05/15/1960.  [place] is being seen in follow-up in the management of currently uncontrolled symptomatic type 2 diabetes.  She also has comorbid dyslipidemia, hypertension.   PMD:  Fayrene Helper, MD.   Past Medical History:  Diagnosis Date   Allergy    Phreesia 06/02/2020   Anxiety    Arthritis    Asthma    Asthma    Phreesia 04/12/2020   CHF (congestive heart failure) ([place])    Phreesia 04/12/2020   Coronary atherosclerosis of native coronary artery    Mild nonobstructive CAD by cardiac catheterization 2008 - Dr. Terrence Dupont   Degenerative disc disease    Depression    Depression    Phreesia 04/12/2020   Diabetes mellitus without complication ([place])    Phreesia 04/12/2020   Dysrhythmia    Essential hypertension, benign    Folliculitis 1/65/7903   GERD (gastroesophageal reflux disease)    H/O hiatal hernia    Helicobacter pylori gastritis 2008   Hypercholesterolemia    Hypertension    Hyperthyroidism    s/p radiation   Left-sided epistaxis 05/15/2018   Low back pain    Migraine    Nephrolithiasis    Recurring episodes since 2004   Neuromuscular disorder ([place])    Phreesia 04/12/2020   Severe obesity (BMI >= 40) (H[facility])    Sleep apnea    STOP BANG SCORE 6no cpap used   Thyroid disease    Phreesia 04/12/2020   Type 2 diabetes mellitus with diabetic neuropathy ([place])     Past Surgical History:  Procedure Laterality Date   [place]  2000 [facility]   INT HEMORRHOIDS/FISSURE   COLONOSCOPY  2003 [facility], CHANGE IN BOWEL HABITS   INT HEMORRHOIDS   COLONOSCOPY  2006 [facility]   INT HEMORRHOIDS   COLONOSCOPY  2007 [facility]   INT  HEMORRHOIDS   CYSTOSCOPY WITH STENT PLACEMENT Left 07/25/2012   Procedure: CYSTOSCOPY, left retograde pyelogram WITH left ureteral  STENT PLACEMENT;  Surgeon: Claybon Jabs, MD;  Location: [facility];  Service: Urology;  Laterality: Left;   CYSTOSCOPY/RETROGRADE/URETEROSCOPY  12/04/2011   Procedure: CYSTOSCOPY/RETROGRADE/URETEROSCOPY;  Surgeon: Molli Hazard, MD;  Location: [facility];  Service: Urology;  Laterality: Right;   CYSTOSCOPY/RETROGRADE/URETEROSCOPY/STONE EXTRACTION WITH BASKET Left 09/03/2012   Procedure: CYSTOSCOPY/RETROGRADE pyelogram/digital URETEROSCOPY/STONE EXTRACTION WITH BASKET, left stent removal;  Surgeon: Molli Hazard, MD;  Location: [facility];  Service: Urology;  Laterality: Left;   ESOPHAGOGASTRODUODENOSCOPY  2008   YBF:XOVAN hiatal hernia./Normal esophagus without evidence of Barrett's mass, stricture, erosion or ulceration./Normal duodenal bulb and second portion of the duodenum./Diffuse erythema in the body and the antrum with occasional erosion.  Biopsies obtained via cold forceps to evaluate for H. pylori gastritis   FRACTURE SURGERY  1999   right clavicle   HOLMIUM LASER APPLICATION Left 05/09/1658   Procedure: HOLMIUM LASER APPLICATION;  Surgeon: Molli Hazard, MD;  Location: [facility];  Service: Urology;  Laterality: Left;   KNEE ARTHROSCOPY  10/04/2010,  right knee arthroscopy, dr Theda Sers   LEFT HEART CATH AND CORONARY ANGIOGRAPHY N/A 05/08/2018   Procedure: LEFT HEART CATH AND CORONARY ANGIOGRAPHY;  Surgeon: Troy Sine, MD;  Location: [facility];  Service: Cardiovascular;  Laterality: N/A;   left knee arthroscopic surgery  07-31-1997   Left salphingectomy secondary to ectopic pregnancy  [place] CATH N/A 07/07/2019   Procedure: RIGHT HEART CATH;  Surgeon: Leonie Man, MD;  Location: [facility] [facility];  Service: Cardiovascular;  Laterality: N/A;   rotary cuff  Right 05/14/2014   [facility]   SPINE  SURGERY N/A    Phreesia 06/02/2020   UPPER GASTROINTESTINAL ENDOSCOPY  2006-08-01 abd pain   H. pylori gastritis   UPPER GASTROINTESTINAL ENDOSCOPY  1996 [place]   PUD    Social History   Socioeconomic History   Marital status: Single    Spouse name: Not on file   Number of children: 1   Years of education: 12   Highest education level: 12th grade  Occupational History   Occupation: Disable   Tobacco Use   Smoking status: Never   Smokeless tobacco: Never  Vaping Use   Vaping Use: Never used  Substance and Sexual Activity   Alcohol use: No   Drug use: No   Sexual activity: Not Currently    Partners: Male    Birth control/protection: Surgical    Comment: hyst  Other Topics Concern   Not on file  Social History Narrative   Lives alone    Social Determinants of Health   Financial Resource Strain: Low Risk    Difficulty of Paying Living Expenses: Not hard at all  Food Insecurity: No Food Insecurity   Worried About Charity fundraiser in the Last Year: Never true   Ran Out of Food in the Last Year: Never true  Transportation Needs: No Transportation Needs   Lack of Transportation (Medical): No   Lack of Transportation (Non-Medical): No  Physical Activity: Sufficiently Active   Days of Exercise per Week: 3 days   Minutes of Exercise per Session: 60 min  Stress: No Stress Concern Present   Feeling of Stress : Not at all  Social Connections: Socially Isolated   Frequency of Communication with Friends and Family: More than three times a week   Frequency of Social Gatherings with Friends and Family: More than three times a week   Attends Religious Services: Never   Marine scientist or Organizations: No   Attends Music therapist: Never   Marital Status: Divorced    Family History  Problem Relation Age of Onset   Heart disease Mother    Hypertension Mother    Cancer Mother        Cervical    Asthma Mother    Heart attack Father    Cancer Father         Prostate   Colon cancer Father        DECEASED AGE [birth year] 01[birth year] 2059   Colon polyps Neg Hx     Outpatient Encounter Medications as of 12/21/2020  Medication Sig   albuterol (VENTOLIN HFA) 108 (90 Base) MCG/ACT inhaler Inhale 2 puffs into the lungs every 6 (six) hours as needed for wheezing or shortness of breath.   amLODipine (NORVASC) 10 MG tablet TAKE 1 TABLET BY MOUTH EVERY DAY   betamethasone, augmented, (DIPROLENE) 0.05 % lotion Apply 1 application topically 2 (two) times  daily.   blood glucose meter kit and supplies Dispense based on patient and insurance preference. Once daily testing dx. e11.9   calcipotriene (DOVONOX) 0.005 % ointment Apply 1 application topically daily.    clobetasol cream (TEMOVATE) 1.77 % Apply 1 application topically 2 (two) times daily.   cloNIDine (CATAPRES - DOSED IN MG/24 HR) 0.3 mg/24hr patch APPLY 1 PATCH TO SKIN ONCE A WEEK   cloNIDine (CATAPRES) 0.3 MG tablet TAKE 1 TABLET (0.3 MG TOTAL) BY MOUTH AT BEDTIME.   cyclobenzaprine (FLEXERIL) 10 MG tablet TAKE 1 TABLET BY MOUTH THREE TIMES A DAY   diazepam (VALIUM) 5 MG tablet Take one tablet by mouth three times daily for back spasm   DULoxetine (CYMBALTA) 60 MG capsule TAKE 1 CAPSULE BY MOUTH TWICE A DAY   ergocalciferol (VITAMIN D2) 1.25 MG (50000 UT) capsule Take 1 capsule (50,000 Units total) by mouth once a week. One capsule once weekly   esomeprazole (NEXIUM) 40 MG capsule TAKE 1 CAPSULE BY MOUTH EVERY DAY   fluticasone (FLONASE) 50 MCG/ACT nasal spray Place 2 sprays into both nostrils daily.   furosemide (LASIX) 40 MG tablet TAKE 1 TABLET BY MOUTH EVERY DAY   gabapentin (NEURONTIN) 800 MG tablet Take 1 tablet (800 mg total) by mouth 4 (four) times daily.   glipiZIDE (GLUCOTROL XL) 10 MG 24 hr tablet TAKE 1 TABLET BY MOUTH EVERY DAY WITH BREAKFAST   hydrOXYzine (ATARAX/VISTARIL) 10 MG tablet Take 10 mg by mouth in the morning, at noon, and at bedtime.   Insulin Lispro Prot & Lispro (HUMALOG MIX 75/25 KWIKPEN)  (75-25) 100 UNIT/ML Kwikpen Inject 65 Units into the skin 2 (two) times daily with a meal.   Insulin Pen Needle (B-D UF III MINI PEN NEEDLES) 31G X 5 MM MISC 1 each by Other route 2 (two) times daily. as directed   ipratropium-albuterol (DUONEB) 0.5-2.5 (3) MG/3ML SOLN Take 3 mLs by nebulization every 6 (six) hours as needed.   KLOR-CON M20 20 MEQ tablet TAKE 1 TABLET BY MOUTH TWICE A DAY   Lancets (ONETOUCH DELICA PLUS [id number]) MISC TEST ONCE DAILY   levothyroxine (SYNTHROID) 75 MCG tablet TAKE 1 TABLET BY MOUTH EVERY DAY   loratadine (CLARITIN) 10 MG tablet Take 1 tablet (10 mg total) by mouth daily.   Magnesium Oxide 400 MG CAPS Take one   meclizine (ANTIVERT) 25 MG tablet Take 25 mg by mouth in the morning, at noon, and at bedtime.   metFORMIN (GLUCOPHAGE) 500 MG tablet TAKE 1 TABLET BY MOUTH 2 TIMES DAILY WITH A MEAL.   metoprolol tartrate (LOPRESSOR) 25 MG tablet TAKE 1 TABLET BY MOUTH TWICE A DAY   nitroGLYCERIN (NITROSTAT) 0.4 MG SL tablet Place 1 tablet (0.4 mg total) under the tongue every 5 (five) minutes as needed for chest pain.   ONETOUCH VERIO test strip USE AS INSTRUCTED FOUR TIMES DAILY TESTING DX E11.65   oxyCODONE (OXYCONTIN) 40 mg 12 hr tablet Take one tablet by mouth two times daily for chronic back pain   [START ON 01/09/2021] oxyCODONE (OXYCONTIN) 40 mg 12 hr tablet Take one tablet by mouth two times daily for chronic pain   [START ON 02/08/2021] oxyCODONE (OXYCONTIN) 40 mg 12 hr tablet Take one tablet by mouth two times daily for chronic pain   spironolactone (ALDACTONE) 25 MG tablet TAKE 1 TABLET (25 MG TOTAL) BY MOUTH DAILY.   SYMBICORT 160-4.5 MCG/ACT inhaler TAKE 2 PUFFS BY MOUTH TWICE A DAY   triamterene-hydrochlorothiazide (MAXZIDE) 75-50 MG tablet  Take 1 tablet by mouth daily.   UNABLE TO FIND Sharps container 6Y or PRN   zolpidem (AMBIEN) 10 MG tablet Take 1 tablet (10 mg total) by mouth at bedtime.   zolpidem (AMBIEN) 10 MG tablet Take 1 tablet (10 mg total) by  mouth at bedtime.   [DISCONTINUED] HUMALOG MIX 75/25 KWIKPEN (75-25) 100 UNIT/ML KwikPen INJECT 60 UNITS INTO THE SKIN 2 (TWO) TIMES DAILY.   [DISCONTINUED] ondansetron (ZOFRAN) 4 MG tablet Take 1 tablet (4 mg total) by mouth every 8 (eight) hours as needed for nausea or vomiting.   No facility-administered encounter medications on file as of 12/21/2020.    ALLERGIES: Allergies  Allergen Reactions   Latex Itching and Rash   Morphine Other (See Comments)    Reaction with esophagus, unable to swallow.    Morphine And Related Shortness Of Breath and Swelling   Other Itching, Shortness Of Breath and Swelling   Ace Inhibitors Cough   Aspirin Other (See Comments)    Reaction with esophagus, unable to swallow.    Losartan Cough   Tapentadol Other (See Comments)    Nausea, increased sleepiness, h/a   Tomato Other (See Comments)    Acid reflux due to acid in tomato   Adhesive [Tape] Rash    VACCINATION STATUS: Immunization History  Administered Date(s) Administered   Influenza Split 03/30/2011   Influenza Whole 03/21/2005, 02/09/2006   Influenza,inj,Quad PF,6+ Mos 02/11/2014, 07/27/2015, 03/09/2016, 01/29/2017, 02/07/2018, 01/30/2019, 02/11/2020   Influenza-Unspecified 01/29/2017   PFIZER(Purple Top)SARS-COV-2 Vaccination 07/22/2019, 08/12/2019, 03/05/2020, 11/19/2020   Pneumococcal Polysaccharide-23 04/17/2006, 09/14/2015   Td 04/17/2006    Diabetes She presents for her follow-up diabetic visit. She has type 2 diabetes mellitus. Her disease course has been improving. There are no hypoglycemic associated symptoms. Pertinent negatives for hypoglycemia include no confusion, headaches, pallor or seizures. Associated symptoms include fatigue. Pertinent negatives for diabetes include no blurred vision, no chest pain, no polydipsia, no polyphagia, no polyuria and no weight loss. There are no hypoglycemic complications. Symptoms are improving. Diabetic complications include heart disease,  nephropathy and peripheral neuropathy. Risk factors for coronary artery disease include dyslipidemia, diabetes mellitus, family history, hypertension, obesity, sedentary lifestyle, tobacco exposure and post-menopausal. Current diabetic treatment includes insulin injections and oral agent (dual therapy). She is compliant with treatment most of the time. Her weight is fluctuating minimally. She is following a generally unhealthy diet. When asked about meal planning, she reported none. She has had a previous visit with a dietitian. She never participates in exercise. Her home blood glucose trend is fluctuating minimally. Her breakfast blood glucose range is generally 140-180 mg/dl. Her bedtime blood glucose range is generally 140-180 mg/dl. (She presents today with her meter and logs showing stable, slightly above target fasting and at goal postprandial glycemic profile.  Her POCT A1c today is 8.9%, improving from last visit of 10%.  She reports an increased amount of stress recently and knows she could have done better.  She denies any hypoglycemia.) An ACE inhibitor/angiotensin II receptor blocker is contraindicated. She does not see a podiatrist.Eye exam is not current.  Hyperlipidemia This is a chronic problem. The current episode started more than 1 year ago. The problem is uncontrolled. Recent lipid tests were reviewed and are high. Exacerbating diseases include chronic renal disease, diabetes, hypothyroidism and obesity. Factors aggravating her hyperlipidemia include beta blockers. Pertinent negatives include no chest pain, myalgias or shortness of breath. Current antihyperlipidemic treatment includes statins. The current treatment provides mild improvement of lipids. Compliance problems  include adherence to diet and adherence to exercise.  Risk factors for coronary artery disease include diabetes mellitus, dyslipidemia, hypertension, obesity and a sedentary lifestyle.  Hypertension This is a chronic  problem. The current episode started more than 1 year ago. The problem has been gradually improving since onset. The problem is controlled. Associated symptoms include peripheral edema. Pertinent negatives include no blurred vision, chest pain, headaches, palpitations or shortness of breath. Agents associated with hypertension include thyroid hormones. Risk factors for coronary artery disease include family history, dyslipidemia, diabetes mellitus, obesity, sedentary lifestyle and post-menopausal state. Past treatments include calcium channel blockers, diuretics and beta blockers. The current treatment provides moderate improvement. Compliance problems include diet and exercise.  Hypertensive end-organ damage includes kidney disease, CAD/MI and heart failure. Identifiable causes of hypertension include chronic renal disease and sleep apnea.   Review of systems  Constitutional: + stable body weight,  current Body mass index is 55.61 kg/m. , + fatigue, no subjective hyperthermia, no subjective hypothermia Eyes: no blurry vision, no xerophthalmia ENT: no sore throat, no nodules palpated in throat, no dysphagia/odynophagia, no hoarseness Cardiovascular: no chest pain, no shortness of breath, no palpitations, no leg swelling Respiratory: no cough, no SOB Gastrointestinal: no nausea/vomiting/diarrhea Musculoskeletal: no muscle/joint aches, walks with cane d/t hx of falls Skin: no rashes, no hyperemia Neurological: no tremors, no numbness, no tingling, no dizziness Psychiatric: no depression, no anxiety, reports increased amount of stress    Objective:    BP (!) 148/88   Pulse 88   Ht 5\' 4"  (1.626 m)   Wt (!) 324 lb (147 kg)   BMI 55.61 kg/m   Wt Readings from Last 3 Encounters:  12/21/20 (!) 324 lb (147 kg)  12/02/20 (!) 325 lb (147.4 kg)  10/15/20 (!) 328 lb (148.8 kg)    BP Readings from Last 3 Encounters:  12/21/20 (!) 148/88  12/02/20 130/80  10/15/20 (!) 152/80     Physical Exam-  Limited  Constitutional:  Body mass index is 55.61 kg/m. , not in acute distress, normal state of mind Eyes:  EOMI, no exophthalmos Neck: Supple Cardiovascular: RRR, no murmurs, rubs, or gallops, no edema Respiratory: Adequate breathing efforts, no crackles, rales, rhonchi, or wheezing Musculoskeletal: no gross deformities, strength intact in all four extremities, no gross restriction of joint movements, walks with cane Skin:  no rashes, no hyperemia Neurological: no tremor with outstretched hands   CMP ( most recent) CMP     Component Value Date/Time   NA 142 11/24/2020 1453   K 4.9 11/24/2020 1453   CL 98 11/24/2020 1453   CO2 26 11/24/2020 1453   GLUCOSE 234 (H) 11/24/2020 1453   GLUCOSE 316 (H) 02/11/2020 1111   BUN 13 11/24/2020 1453   CREATININE 0.65 11/24/2020 1453   CREATININE 0.91 02/11/2020 1111   CALCIUM 9.6 11/24/2020 1453   PROT 8.0 11/24/2020 1453   ALBUMIN 4.7 11/24/2020 1453   AST 457 (H) 11/24/2020 1453   ALT 272 (H) 11/24/2020 1453   ALKPHOS 175 (H) 11/24/2020 1453   BILITOT 0.4 11/24/2020 1453   GFRNONAA 70 05/06/2020 1201   GFRNONAA 69 02/11/2020 1111   GFRAA 80 05/06/2020 1201   GFRAA 80 02/11/2020 1111    Diabetic Labs (most recent): Lab Results  Component Value Date   HGBA1C 8.9 (A) 12/21/2020   HGBA1C 10.0 (A) 09/20/2020   HGBA1C 9.1 (A) 06/22/2020     Lipid Panel ( most recent) Lipid Panel     Component Value Date/Time  CHOL 140 11/24/2020 1453   TRIG 102 11/24/2020 1453   HDL 47 11/24/2020 1453   CHOLHDL 3.0 11/24/2020 1453   CHOLHDL 4.0 11/10/2019 1303   VLDL 16 05/06/2018 0335   LDLCALC 74 11/24/2020 1453   LDLCALC 97 11/10/2019 1303      Assessment & Plan:   1) Uncontrolled type 2 diabetes mellitus with hyperglycemia ([place])  - Angela [name] has currently uncontrolled symptomatic type 2 DM since  60 years of age.  She presents today with her meter and logs showing stable, slightly above target fasting and at goal  postprandial glycemic profile.  Her POCT A1c today is 8.9%, improving from last visit of 10%.  She reports an increased amount of stress recently and knows she could have done better.  She denies any hypoglycemia.   Recent labs reviewed.  -her diabetes is complicated by coronary artery disease, CHF, obesity/sedentary life and she remains at a high risk for more acute and chronic complications which include CAD, CVA, CKD, retinopathy, and neuropathy. These are all discussed in detail with her.  - Nutritional counseling repeated at each appointment due to patients tendency to fall back in to old habits.  - The patient admits there is a room for improvement in their diet and drink choices. -  Suggestion is made for the patient to avoid simple carbohydrates from their diet including Cakes, Sweet Desserts / Pastries, Ice Cream, Soda (diet and regular), Sweet Tea, Candies, Chips, Cookies, Sweet Pastries, Store Bought Juices, Alcohol in Excess of 1-2 drinks a day, Artificial Sweeteners, Coffee Creamer, and "Sugar-free" Products. This will help patient to have stable blood glucose profile and potentially avoid unintended weight gain.   - I encouraged the patient to switch to unprocessed or minimally processed complex starch and increased protein intake (animal or plant source), fruits, and vegetables.   - Patient is advised to stick to a routine mealtimes to eat 3 meals a day and avoid unnecessary snacks (to snack only to correct hypoglycemia).  - she has previously been scheduled with Jearld Fenton, RDN, CDE for individualized diabetes education.  - I have approached her with the following individualized plan to manage diabetes and patient agrees:   -Given her presentation with chronic glycemic burden, she will likely require multiple daily injections of insulin in order for her to achieve control of diabetes to target.  -She will tolerate slight increase in her Humalog 75/25 to 65 units SQ BID with  breakfast and supper if glucose is above 90 and she is eating.  She can continue Metformin 500 mg po twice daily with meals and Glipizide 10 mg XL daily with breakfast for now.    -She is encouraged to consistently monitor glucose 3 times daily, before injecting insulin and before bed, and to call the clinic if she has readings less than 70 or greater than 300 for 3 tests in a row.  - she is warned not to take insulin without proper monitoring per orders.  2) Blood Pressure /Hypertension: Her blood pressure is slightly above target today.  She is advised to continue Norvasc 10 mg po daily, Clonidine 0.3 mg patch once weekly, Clonidine 0.3 mg po daily at bedtime, Lasix 40 mg po daily, and Metoprolol 25 mg po twice daily.  3) Lipids/Hyperlipidemia:  Her most recent lipid panel from 05/06/20 shows uncontrolled LDL at 124 (worsening) and elevated triglycerides of 164 (improving).  She is advised to continue Crestor 5 mg po daily at bedtime.    4)  Weight/Diet:  Her Body mass index is 55.61 kg/m. - clearly complicating her diabetes care.  She is a candidate for modest weight loss.  She has multiple comorbid situations complicating her chance of bariatric surgery.  I discussed with her the fact that loss of 5 - 10% of her  current body weight will have the most impact on her diabetes management.  CDE Consult will be initiated . Exercise, and detailed carbohydrates information provided  -  detailed on discharge instructions.  5) Hypothyroidism:  -Her previsit thyroid function tests are consistent with appropriate hormone replacement.  She is advised to continue Levothyroxine 75 mcg po daily before breakfast.     - We discussed about the correct intake of her thyroid hormone, on empty stomach at fasting, with water, separated by at least 30 minutes from breakfast and other medications,  and separated by more than 4 hours from calcium, iron, multivitamins, acid reflux medications (PPIs). -Patient is made  aware of the fact that thyroid hormone replacement is needed for life, dose to be adjusted by periodic monitoring of thyroid function tests.  6) Chronic Care/Health Maintenance: -she is not on ACE/ARB (intolerant to both) is on statin medications and is encouraged to initiate and continue to follow up with Ophthalmology, Dentist,  Podiatrist at least yearly or according to recommendations, and advised to  stay away from smoking. I have recommended yearly flu vaccine and pneumonia vaccine at least every 5 years; moderate intensity exercise for up to 150 minutes weekly; and  sleep for at least 7 hours a day.  - she is advised to maintain close follow up with Fayrene Helper, MD for primary care needs, as well as her other providers for optimal and coordinated care.     I spent 44 minutes in the care of the patient today including review of labs from [facility], Lipids, Thyroid Function, Hematology (current and previous including abstractions from other facilities); face-to-face time discussing  her blood glucose readings/logs, discussing hypoglycemia and hyperglycemia episodes and symptoms, medications doses, her options of short and long term treatment based on the latest standards of care / guidelines;  discussion about incorporating lifestyle medicine;  and documenting the encounter.    Please refer to Patient Instructions for Blood Glucose Monitoring and Insulin/Medications Dosing Guide"  in media tab for additional information. Please  also refer to " Patient Self Inventory" in the Media  tab for reviewed elements of pertinent patient history.  [place] participated in the discussions, expressed understanding, and voiced agreement with the above plans.  All questions were answered to her satisfaction. she is encouraged to contact clinic should she have any questions or concerns prior to her return visit.  Follow up plan: - Return in about 3 months (around 03/23/2021) for Diabetes F/U- A1c and  UM in office, Previsit labs, Bring meter and logs.    Rayetta Pigg, [facility] [address] [place], [place] [address] Phone: [phone] Fax: [phone]   12/21/2020, 10:44 AM

## 2020-12-27 DIAGNOSIS — L400 Psoriasis vulgaris: Secondary | ICD-10-CM | POA: Diagnosis not present

## 2020-12-27 DIAGNOSIS — I83893 Varicose veins of bilateral lower extremities with other complications: Secondary | ICD-10-CM | POA: Diagnosis not present

## 2020-12-27 DIAGNOSIS — L280 Lichen simplex chronicus: Secondary | ICD-10-CM | POA: Diagnosis not present

## 2021-01-04 ENCOUNTER — Other Ambulatory Visit: Payer: Self-pay | Admitting: Nurse Practitioner

## 2021-01-04 ENCOUNTER — Other Ambulatory Visit: Payer: Self-pay | Admitting: Family Medicine

## 2021-01-04 DIAGNOSIS — R0609 Other forms of dyspnea: Secondary | ICD-10-CM | POA: Diagnosis not present

## 2021-01-04 DIAGNOSIS — M62830 Muscle spasm of back: Secondary | ICD-10-CM

## 2021-01-04 DIAGNOSIS — E1159 Type 2 diabetes mellitus with other circulatory complications: Secondary | ICD-10-CM

## 2021-01-05 ENCOUNTER — Ambulatory Visit (INDEPENDENT_AMBULATORY_CARE_PROVIDER_SITE_OTHER): Payer: Medicare Other | Admitting: Internal Medicine

## 2021-01-05 ENCOUNTER — Encounter: Payer: Self-pay | Admitting: Internal Medicine

## 2021-01-05 ENCOUNTER — Other Ambulatory Visit: Payer: Self-pay

## 2021-01-05 VITALS — BP 145/82 | HR 76 | Temp 97.1°F | Ht 64.0 in | Wt 325.4 lb

## 2021-01-05 DIAGNOSIS — R748 Abnormal levels of other serum enzymes: Secondary | ICD-10-CM | POA: Diagnosis not present

## 2021-01-05 DIAGNOSIS — K760 Fatty (change of) liver, not elsewhere classified: Secondary | ICD-10-CM

## 2021-01-05 NOTE — Patient Instructions (Signed)
I am going to order blood work at [facility] to further evaluate your abnormal liver function tests.  Possible this was all related to the rosuvastatin medication that you are on.  Continue to avoid this.  We may need to consider liver biopsy in the future pending blood results.  You do have evidence of fatty liver disease on your ultrasound.  See further information below.  The best thing you can do is work on weight loss, keeping your diabetes and cholesterol under control.  It was very nice meeting you today.  Dr. Abbey Chatters     Nonalcoholic Fatty Liver Disease Diet, Adult Nonalcoholic fatty liver disease is a condition that causes fat to build up in and around the liver. The disease makes it harder for the liver to work the way that it should. Following a healthy diet can help to keep nonalcoholic fatty liver disease under control. It can also help to prevent or improve conditions that are associated with the disease, such as heart disease, diabetes, high blood pressure, and abnormal cholesterol levels. Along with regular exercise, this diet: Promotes weight loss. Helps to control blood sugar levels. Helps to improve the way that the body uses insulin. What are tips for following this plan? Reading food labels  Always check food labels for: The amount of saturated fat in a food. You should limit your intake of saturated fat. Saturated fat is found in foods that come from animals, including meat and dairy products such as butter, cheese, and whole milk. The amount of fiber in a food. You should choose high-fiber foods such as fruits, vegetables, and whole grains. Try to get 25-30 grams (g) of fiber a day.   Cooking When cooking, use heart-healthy oils that are high in monounsaturated fats. These include olive oil, canola oil, and avocado oil. Limit frying or deep-frying foods. Cook foods using healthy methods such as baking, boiling, steaming, and grilling instead. Meal planning You may  want to keep track of how many calories you take in. Eating the right amount of calories will help you achieve a healthy weight. Meeting with a registered dietitian can help you get started. Limit how often you eat takeout and fast food. These foods are usually very high in fat, salt, and sugar. Use the glycemic index (GI) to plan your meals. The index tells you how quickly a food will raise your blood sugar. Choose low-GI foods (GI less than 55). These foods take a longer time to raise blood sugar. A registered dietitian can help you identify foods lower on the GI scale. Lifestyle You may want to follow a Mediterranean diet. This diet includes a lot of vegetables, lean meats or fish, whole grains, fruits, and healthy oils and fats. What foods can I eat?    Fruits Bananas. Apples. Oranges. Grapes. Papaya. Mango. Pomegranate. Kiwi. Grapefruit. Cherries. Vegetables Lettuce. Spinach. Peas. Beets. Cauliflower. Cabbage. Broccoli. Carrots. Tomatoes. Squash. Eggplant. Herbs. Peppers. Onions. Cucumbers. Brussels sprouts. Yams and sweet potatoes. Beans. Lentils. Grains Whole wheat or whole-grain foods, including breads, crackers, cereals, and pasta. Stone-ground whole wheat. Unsweetened oatmeal. Bulgur. Barley. Quinoa. Brown or wild rice. Corn or whole wheat flour tortillas. Meats and other proteins Lean meats. Poultry. Tofu. Seafood and shellfish. Dairy Low-fat or fat-free dairy products, such as yogurt, cottage cheese, or cheese. Beverages Water. Sugar-free drinks. Tea. Coffee. Low-fat or skim milk. Milk alternatives, such as soy or almond milk. Real fruit juice. Fats and oils Avocado. Canola or olive oil. Nuts and nut butters. Seeds.  Seasonings and condiments Mustard. Relish. Low-fat, low-sugar ketchup and barbecue sauce. Low-fat or fat-free mayonnaise. Sweets and desserts Sugar-free sweets. The items listed above may not be a complete list of foods and beverages you can eat. Contact a dietitian  for more information. What foods should I limit or avoid? Meats and other proteins Limit red meat to 1-2 times a week. Dairy [employer]. Fats and oils Palm oil and coconut oil. Fried foods. Other foods Processed foods. Foods that contain a lot of salt or sodium. Sweets and desserts Sweets that contain sugar. Beverages Sweetened drinks, such as sweet tea, milkshakes, iced sweet drinks, and sodas. Alcohol. The items listed above may not be a complete list of foods and beverages you should avoid. Contact a dietitian for more information. Where to find more information The [employer] of Diabetes and Digestive and Kidney Diseases: [employer] Summary Nonalcoholic fatty liver disease is a condition that causes fat to build up in and around the liver. Following a healthy diet can help to keep nonalcoholic fatty liver disease under control. Your diet should be rich in fruits, vegetables, whole grains, and lean proteins. Limit your intake of saturated fat. Saturated fat is found in foods that come from animals, including meat and dairy products such as butter, cheese, and whole milk. This diet promotes weight loss, helps to control blood sugar levels, and helps to improve the way that the body uses insulin. This information is not intended to replace advice given to you by your health care provider. Make sure you discuss any questions you have with your health care provider. Document Revised: 08/09/2018 Document Reviewed: 05/09/2018 Elsevier Patient Education  2020 [facility] we value your feedback. You may receive a survey about your visit today. Please share your experience as we strive to create trusting relationships with our patients to provide genuine, compassionate, quality care.  We appreciate your understanding and patience as we review any laboratory studies, imaging, and other diagnostic tests that are ordered as we care for you. Our  office policy is 5 business days for review of these results, and any emergent or urgent results are addressed in a timely manner for your best interest. If you do not hear from our office in 1 week, please contact us.   We also encourage the use of MyChart, which contains your medical information for your review as well. If you are not enrolled in this feature, an access code is on this after visit summary for your convenience. Thank you for allowing [place] to be involved in your care.  It was great to see you today!  I hope you have a great rest of your summer!!    Elon Alas. Abbey Chatters, D.O. Gastroenterology and Hepatology [facility]

## 2021-01-07 ENCOUNTER — Other Ambulatory Visit: Payer: Self-pay

## 2021-01-07 ENCOUNTER — Other Ambulatory Visit: Payer: Self-pay | Admitting: Internal Medicine

## 2021-01-07 ENCOUNTER — Ambulatory Visit (INDEPENDENT_AMBULATORY_CARE_PROVIDER_SITE_OTHER): Payer: Medicare Other | Admitting: Pulmonary Disease

## 2021-01-07 ENCOUNTER — Other Ambulatory Visit: Payer: Self-pay | Admitting: Family Medicine

## 2021-01-07 ENCOUNTER — Encounter: Payer: Self-pay | Admitting: Pulmonary Disease

## 2021-01-07 ENCOUNTER — Telehealth: Payer: Self-pay

## 2021-01-07 DIAGNOSIS — G4733 Obstructive sleep apnea (adult) (pediatric): Secondary | ICD-10-CM | POA: Diagnosis not present

## 2021-01-07 DIAGNOSIS — J4530 Mild persistent asthma, uncomplicated: Secondary | ICD-10-CM | POA: Diagnosis not present

## 2021-01-07 DIAGNOSIS — I5032 Chronic diastolic (congestive) heart failure: Secondary | ICD-10-CM | POA: Diagnosis not present

## 2021-01-07 DIAGNOSIS — R772 Abnormality of alphafetoprotein: Secondary | ICD-10-CM | POA: Diagnosis not present

## 2021-01-07 DIAGNOSIS — J9611 Chronic respiratory failure with hypoxia: Secondary | ICD-10-CM

## 2021-01-07 DIAGNOSIS — K760 Fatty (change of) liver, not elsewhere classified: Secondary | ICD-10-CM | POA: Diagnosis not present

## 2021-01-07 DIAGNOSIS — R945 Abnormal results of liver function studies: Secondary | ICD-10-CM | POA: Diagnosis not present

## 2021-01-07 DIAGNOSIS — R748 Abnormal levels of other serum enzymes: Secondary | ICD-10-CM | POA: Diagnosis not present

## 2021-01-07 MED ORDER — DIAZEPAM 5 MG PO TABS: ORAL_TABLET | ORAL | 3 refills | Status: DC

## 2021-01-07 MED ORDER — NALOXONE HCL 4 MG/0.1ML NA LIQD
NASAL | 0 refills | Status: AC
Start: 2021-01-07 — End: ?

## 2021-01-07 MED ORDER — BENZONATATE 100 MG PO CAPS: 100.0000 mg | ORAL_CAPSULE | Freq: Two times a day (BID) | ORAL | 1 refills | Status: DC | PRN

## 2021-01-07 NOTE — Progress Notes (Signed)
   Subjective:    [name], female    DOB: 1960-09-16, 60 y.o.   MRN: [id number]  HPI  60 yo morbidly obese never smoker for follow-up of "asthma" and chronic diastolic heart failure   She reports onset of intermittent wheezing and a dry cough since her cardiac cath in 05/2018.   PMH -  difficult to control hypertension  on carvedilol, amlodipine and clonidine, chronic pain for which she takes oxycodone.  OSA on CPAP 10 cm with full facemask , followed by neurology   Chief Complaint  Patient presents with   Follow-up    2L O2 all of the time and cpap at night. Increased SOB when walking a long distance Increase in cough in the morning coughing up yellow/brown/clear mucus. At night time coughing/wheezing.     Last OV 12/2019 , wt was down to 294 lbs Accompanied by her daughter Angela [name], unfortunately her weight has gone back up to 322 pounds.  She was put on oxygen by her PCP seems to be using this almost all the time on 2 L.  Saturation dropped to 93% on walking to the exam room. Chest x-ray 08/2020 reviewed which shows clear lungs. Labs from 10/2020 shows potassium of 4.9, increased LFTs, statin has been held and repeat blood work is planned today  She is compliant with Symbicort.  No problems with CPAP mask or pressure. She is worried about COVID infection although she is vaccinated and boosted   Significant tests/ events reviewed  07/2019-PFTs -ratio normal, FEV1 1.94/91%, significant bronchodilator response, improved to 105%, high normal DLCO   07/07/2019-right heart cath-no evidence of pulmonary hypertension, suspect noncardiac etiology for dyspnea although diastolic dysfunction component is also possible   Cardiac cath 05/2018 no significant CAD, LVEDP 14   Echo 99991111 grade 2 diastolic dysfunction, normal LVEF   CT renal stone study 06/2017 >> bibasilar atelectasis, no ILD   2015 normal spirometry 01/2016 split-night study-AHI 22/hour, CPAP 10 cm   Asthma was  diagnosed in 2014 spirometry  normal in 2015.  Review of Systems neg for any significant sore throat, dysphagia, itching, sneezing, nasal congestion or excess/ purulent secretions, fever, chills, sweats, unintended wt loss, pleuritic or exertional cp, hempoptysis, orthopnea pnd or change in chronic leg swelling. Also denies presyncope, palpitations, heartburn, abdominal pain, nausea, vomiting, diarrhea or change in bowel or urinary habits, dysuria,hematuria, rash, arthralgias, visual complaints, headache, numbness weakness or ataxia.     Objective:   Physical Exam  Gen. Pleasant, obese, in no distress ENT - no lesions, no post nasal drip Neck: No JVD, no thyromegaly, no carotid bruits Lungs: no use of accessory muscles, no dullness to percussion, decreased without rales or rhonchi  Cardiovascular: Rhythm regular, heart sounds  normal, no murmurs or gallops, 1+ peripheral edema Musculoskeletal: No deformities, no cyanosis or clubbing , no tremors       Assessment & Plan:

## 2021-01-07 NOTE — Telephone Encounter (Signed)
Ok to refill 

## 2021-01-07 NOTE — Assessment & Plan Note (Signed)
Again doubt true asthma here, will continue Symbicort and albuterol to use on a as needed basis

## 2021-01-07 NOTE — Progress Notes (Signed)
Tessalon perle

## 2021-01-07 NOTE — Assessment & Plan Note (Signed)
Seems to be related to chronic diastolic heart failure and obesity. I offered her pulmonary rehab but she is hesitant to come to the center and daughter is unable to transport.  Would benefit from a home-based program

## 2021-01-07 NOTE — Patient Instructions (Signed)
Continue symbicort CPAP is working well We may have to increase lasix twice daily every other day x 1 week after your spasms get better

## 2021-01-07 NOTE — Assessment & Plan Note (Signed)
CPAP download was reviewed which shows excellent compliance and good control of events on 10 cm with minimal leak  Weight loss encouraged, compliance with goal of at least 4-6 hrs every night is the expectation. Advised against medications with sedative side effects Cautioned against driving when sleepy - understanding that sleepiness will vary on a day to day basis

## 2021-01-07 NOTE — Telephone Encounter (Signed)
Patient came by the office and need med refill benzonatate (TESSALON PERLES) 100 MG capsule  Pharmacy:  [facility] [place]  Patient call back # [phone]

## 2021-01-07 NOTE — Assessment & Plan Note (Signed)
Her weight is up more than 25 pounds and this is the likely cause of increased dyspnea and hypoxia.  She is having significant spasms now and perhaps would not tolerate increased diuretic.  She will check blood work today, we will repeat electrolytes if needed and then we can offer her increased diuretic Lasix 40 mg twice daily to aim for a 5 to 10 pound weight loss

## 2021-01-10 LAB — IRON AND TIBC
Iron Saturation: 14 % — ABNORMAL LOW (ref 15–55)
Iron: 45 ug/dL (ref 27–159)
Total Iron Binding Capacity: 329 ug/dL (ref 250–450)
UIBC: 284 ug/dL (ref 131–425)

## 2021-01-10 LAB — HEPATIC FUNCTION PANEL
ALT: 19 IU/L (ref 0–32)
AST: 16 IU/L (ref 0–40)
Albumin: 4.4 g/dL (ref 3.8–4.9)
Alkaline Phosphatase: 90 IU/L (ref 44–121)
Bilirubin Total: 0.2 mg/dL (ref 0.0–1.2)
Bilirubin, Direct: <0.1 mg/dL (ref 0.00–0.40)
Total Protein: 7.5 g/dL (ref 6.0–8.5)

## 2021-01-10 LAB — AFP TUMOR MARKER: AFP, Serum, Tumor Marker: 2.8 ng/mL (ref 0.0–9.2)

## 2021-01-10 LAB — SPECIMEN STATUS REPORT

## 2021-01-10 LAB — FERRITIN: Ferritin: 235 ng/mL — ABNORMAL HIGH (ref 15–150)

## 2021-01-10 LAB — ALPHA-1-ANTITRYPSIN: A-1 Antitrypsin: 126 mg/dL (ref 101–187)

## 2021-01-10 LAB — ANTI-SMOOTH MUSCLE ANTIBODY, IGG: Smooth Muscle Ab: 13 Units (ref 0–19)

## 2021-01-10 LAB — MITOCHONDRIAL ANTIBODIES: Mitochondrial Ab: <20 Units (ref 0.0–20.0)

## 2021-01-11 ENCOUNTER — Telehealth: Payer: Medicare Other | Admitting: Adult Health

## 2021-01-11 ENCOUNTER — Telehealth: Payer: Self-pay | Admitting: Adult Health

## 2021-01-11 NOTE — Telephone Encounter (Signed)
Pt was a no show due to issues with MyChart.

## 2021-01-13 NOTE — Progress Notes (Signed)
Primary Care Physician:  Kerri Perches, MD Primary Gastroenterologist:  Dr. Marletta Lor  Chief Complaint  Patient presents with   elevated lft's    HPI:   Angela [name] is a 60 y.o. female who presents to the clinic today by referral from her PCP Dr. Lodema Hong for abnormal liver function test.  Patient's liver tests were normal on blood work 05/06/2020, on repeat blood work 11/24/2020 she had acute elevation including AST 457, ALT 272, T bili 0.4, alk phos 175.  She does note recently starting rosuvastatin prior to this blood work.  She underwent ultrasound 12/03/2020 which showed fatty liver disease as well as multiple liver cysts larger which measuring 4.6 x 5.2 x 4.9 cm.  Multiple cysts were seen on CT scan in 2019 as well.  Patient does have risk factors for fatty liver disease including obesity, diabetes, dyslipidemia.  No alcohol use.  No family history of liver disease.  No new herbal supplements.  No polysubstance abuse.  Recent viral hepatitis panel negative.  Past Medical History:  Diagnosis Date   Allergy    Phreesia 06/02/2020   Anxiety    Arthritis    Asthma    Asthma    Phreesia 04/12/2020   CHF (congestive heart failure) (HCC)    Phreesia 04/12/2020   Coronary atherosclerosis of native coronary artery    Mild nonobstructive CAD by cardiac catheterization 2008 - Dr. Sharyn Lull   Degenerative disc disease    Depression    Depression    Phreesia 04/12/2020   Diabetes mellitus without complication (HCC)    Phreesia 04/12/2020   Dysrhythmia    Essential hypertension, benign    Folliculitis 01/21/2015   GERD (gastroesophageal reflux disease)    H/O hiatal hernia    Helicobacter pylori gastritis 2008   Hypercholesterolemia    Hypertension    Hyperthyroidism    s/p radiation   Left-sided epistaxis 05/15/2018   Low back pain    Migraine    Nephrolithiasis    Recurring episodes since 2004   Neuromuscular disorder (HCC)    Phreesia 04/12/2020   Severe obesity (BMI >=  40) (HCC)    Sleep apnea    STOP BANG SCORE 6no cpap used   Thyroid disease    Phreesia 04/12/2020   Type 2 diabetes mellitus with diabetic neuropathy (HCC)     Past Surgical History:  Procedure Laterality Date   ABDOMINAL HYSTERECTOMY     BACK SURGERY  1993   BACK SURGERY  1994   BACK SURGERY  2002   BACK SURGERY  2011   [facility]    CARPAL TUNNEL RELEASE Left    CHOLECYSTECTOMY  1996   COLONOSCOPY  2000 [facility]   INT HEMORRHOIDS/FISSURE   COLONOSCOPY  2003 [facility], CHANGE IN BOWEL HABITS   INT HEMORRHOIDS   COLONOSCOPY  2006 [facility]   INT HEMORRHOIDS   COLONOSCOPY  2007 [facility]   INT HEMORRHOIDS   CYSTOSCOPY WITH STENT PLACEMENT Left 07/25/2012   Procedure: CYSTOSCOPY, left retograde pyelogram WITH left ureteral  STENT PLACEMENT;  Surgeon: Garnett Farm, MD;  Location: [facility];  Service: Urology;  Laterality: Left;   CYSTOSCOPY/RETROGRADE/URETEROSCOPY  12/04/2011   Procedure: CYSTOSCOPY/RETROGRADE/URETEROSCOPY;  Surgeon: Milford Cage, MD;  Location: [facility];  Service: Urology;  Laterality: Right;   CYSTOSCOPY/RETROGRADE/URETEROSCOPY/STONE EXTRACTION WITH BASKET Left 09/03/2012   Procedure: CYSTOSCOPY/RETROGRADE pyelogram/digital URETEROSCOPY/STONE EXTRACTION WITH BASKET, left stent removal;  Surgeon: Milford Cage, MD;  Location: [facility];  Service: Urology;  Laterality: Left;  ESOPHAGOGASTRODUODENOSCOPY  2008   JEH:UDJSH hiatal hernia./Normal esophagus without evidence of Barrett's mass, stricture, erosion or ulceration./Normal duodenal bulb and second portion of the duodenum./Diffuse erythema in the body and the antrum with occasional erosion.  Biopsies obtained via cold forceps to evaluate for H. pylori gastritis   FRACTURE SURGERY  1999   right clavicle   HOLMIUM LASER APPLICATION Left 7/0/2637   Procedure: HOLMIUM LASER APPLICATION;  Surgeon: Molli Hazard, MD;  Location: [facility];  Service: Urology;  Laterality: Left;   KNEE ARTHROSCOPY  10/04/2010,    right  knee arthroscopy, dr Theda Sers   LEFT HEART CATH AND CORONARY ANGIOGRAPHY N/A 05/08/2018   Procedure: LEFT HEART CATH AND CORONARY ANGIOGRAPHY;  Surgeon: Troy Sine, MD;  Location: [place] [facility];  Service: Cardiovascular;  Laterality: N/A;   left knee arthroscopic surgery  1999   Left salphingectomy secondary to ectopic pregnancy  [place] CATH N/A 07/07/2019   Procedure: RIGHT HEART CATH;  Surgeon: Leonie Man, MD;  Location: [place] [facility];  Service: Cardiovascular;  Laterality: N/A;   rotary cuff  Right 05/14/2014   [facility]   SPINE SURGERY N/A    Phreesia 06/02/2020   UPPER GASTROINTESTINAL ENDOSCOPY  2008 abd pain   H. pylori gastritis   UPPER GASTROINTESTINAL ENDOSCOPY  1996 [facility] [place]   PUD    Current Outpatient Medications  Medication Sig Dispense Refill   albuterol (VENTOLIN HFA) 108 (90 Base) MCG/ACT inhaler Inhale 2 puffs into the lungs every 6 (six) hours as needed for wheezing or shortness of breath. 8 g 6   amLODipine (NORVASC) 10 MG tablet TAKE 1 TABLET BY MOUTH EVERY DAY 90 tablet 3   betamethasone, augmented, (DIPROLENE) 0.05 % lotion Apply 1 application topically 2 (two) times daily.     blood glucose meter kit and supplies Dispense based on patient and insurance preference. Once daily testing dx. e11.9 1 each 0   calcipotriene (DOVONOX) 0.005 % ointment Apply 1 application topically daily.      clobetasol cream (TEMOVATE) 8.58 % Apply 1 application topically 2 (two) times daily.     cloNIDine (CATAPRES - DOSED IN MG/24 HR) 0.3 mg/24hr patch APPLY 1 PATCH TO SKIN ONCE A WEEK 12 patch 0   cloNIDine (CATAPRES) 0.3 MG tablet TAKE 1 TABLET (0.3 MG TOTAL) BY MOUTH AT BEDTIME. 90 tablet 1   DULoxetine (CYMBALTA) 60 MG capsule TAKE 1 CAPSULE BY MOUTH TWICE A DAY 180 capsule 0   ergocalciferol (VITAMIN D2) 1.25 MG (50000 UT) capsule Take 1 capsule (50,000 Units total) by mouth once a week. One capsule once weekly 12 capsule 3    esomeprazole (NEXIUM) 40 MG capsule TAKE 1 CAPSULE BY MOUTH EVERY DAY 90 capsule 0   fluticasone (FLONASE) 50 MCG/ACT nasal spray Place 2 sprays into both nostrils daily. 16 g 6   furosemide (LASIX) 40 MG tablet TAKE 1 TABLET BY MOUTH EVERY DAY 90 tablet 0   gabapentin (NEURONTIN) 800 MG tablet Take 1 tablet (800 mg total) by mouth 4 (four) times daily. 360 tablet 0   glipiZIDE (GLUCOTROL XL) 10 MG 24 hr tablet TAKE 1 TABLET BY MOUTH EVERY DAY WITH BREAKFAST 90 tablet 0   hydrOXYzine (ATARAX/VISTARIL) 10 MG tablet Take 10 mg by mouth in the morning, at noon, and at bedtime.  2   Insulin Lispro Prot & Lispro (HUMALOG MIX 75/25 KWIKPEN) (75-25) 100 UNIT/ML Kwikpen Inject 65 Units into  the skin 2 (two) times daily with a meal. 90 mL 3   Insulin Pen Needle (B-D UF III MINI PEN NEEDLES) 31G X 5 MM MISC 1 each by Other route 2 (two) times daily. as directed 100 each 3   ipratropium-albuterol (DUONEB) 0.5-2.5 (3) MG/3ML SOLN Take 3 mLs by nebulization every 6 (six) hours as needed. 360 mL 3   KLOR-CON M20 20 MEQ tablet TAKE 1 TABLET BY MOUTH TWICE A DAY 180 tablet 1   Lancets (ONETOUCH DELICA PLUS LKGMWN02V) MISC TEST ONCE DAILY 100 each 0   levothyroxine (SYNTHROID) 75 MCG tablet TAKE 1 TABLET BY MOUTH EVERY DAY 90 tablet 1   loratadine (CLARITIN) 10 MG tablet Take 1 tablet (10 mg total) by mouth daily. 30 tablet 11   Magnesium Oxide 400 MG CAPS Take one 90 capsule 3   meclizine (ANTIVERT) 25 MG tablet Take 25 mg by mouth in the morning, at noon, and at bedtime.     metFORMIN (GLUCOPHAGE) 500 MG tablet TAKE 1 TABLET BY MOUTH 2 TIMES DAILY WITH A MEAL. 180 tablet 0   metoprolol tartrate (LOPRESSOR) 25 MG tablet TAKE 1 TABLET BY MOUTH TWICE A DAY 180 tablet 3   nitroGLYCERIN (NITROSTAT) 0.4 MG SL tablet Place 1 tablet (0.4 mg total) under the tongue every 5 (five) minutes as needed for chest pain. 30 tablet 7   ONETOUCH VERIO test strip USE AS INSTRUCTED FOUR TIMES DAILY TESTING DX E11.65 100 strip 8    oxyCODONE (OXYCONTIN) 40 mg 12 hr tablet Take one tablet by mouth two times daily for chronic pain 60 tablet 0   [START ON 02/08/2021] oxyCODONE (OXYCONTIN) 40 mg 12 hr tablet Take one tablet by mouth two times daily for chronic pain 60 tablet 0   spironolactone (ALDACTONE) 25 MG tablet TAKE 1 TABLET (25 MG TOTAL) BY MOUTH DAILY. 90 tablet 1   SYMBICORT 160-4.5 MCG/ACT inhaler TAKE 2 PUFFS BY MOUTH TWICE A DAY 30.6 each 3   triamterene-hydrochlorothiazide (MAXZIDE) 75-50 MG tablet Take 1 tablet by mouth daily.     UNABLE TO FIND Sharps container 6Y or PRN 6 each 3   zolpidem (AMBIEN) 10 MG tablet Take 1 tablet (10 mg total) by mouth at bedtime. 30 tablet 5   zolpidem (AMBIEN) 10 MG tablet Take 1 tablet (10 mg total) by mouth at bedtime. 30 tablet 5   benzonatate (TESSALON) 100 MG capsule Take 1 capsule (100 mg total) by mouth 2 (two) times daily as needed for cough. 20 capsule 1   cyclobenzaprine (FLEXERIL) 10 MG tablet TAKE 1 TABLET BY MOUTH THREE TIMES A DAY 90 tablet 1   diazepam (VALIUM) 5 MG tablet Take one tablet  by mouth three times daily for muscle spasm 90 tablet 3   naloxone (NARCAN) nasal spray 4 mg/0.1 mL One spray into each nostril once as needed 1 each 0   No current facility-administered medications for this visit.    Allergies as of 01/05/2021 - Review Complete 01/05/2021  Allergen Reaction Noted   Latex Itching and Rash 06/02/2020   Morphine Other (See Comments) 07/23/2007   Morphine and related Shortness Of Breath and Swelling 06/02/2020   Other Itching, Shortness Of Breath, and Swelling 04/12/2020   Ace inhibitors Cough 04/01/2013   Aspirin Other (See Comments) 07/23/2007   Losartan Cough 04/01/2013   Tapentadol Other (See Comments) 07/18/2016   Tomato Other (See Comments) 07/13/2013   Adhesive [tape] Rash 08/30/2012    Family History  Problem Relation Age  of Onset   Heart disease Mother    Hypertension Mother    Cancer Mother        Cervical    Asthma Mother     Heart attack Father    Cancer Father        Prostate   Colon cancer Father        DECEASED AGE 33   Colon polyps Neg Hx     Social History   Socioeconomic History   Marital status: Single    Spouse name: Not on file   Number of children: 1   Years of education: 26   Highest education level: 12th grade  Occupational History   Occupation: Disable   Tobacco Use   Smoking status: Never   Smokeless tobacco: Never  Vaping Use   Vaping Use: Never used  Substance and Sexual Activity   Alcohol use: No   Drug use: No   Sexual activity: Not Currently    Partners: Male    Birth control/protection: Surgical    Comment: hyst  Other Topics Concern   Not on file  Social History Narrative   Lives alone    Social Determinants of Health   Financial Resource Strain: Low Risk    Difficulty of Paying Living Expenses: Not hard at all  Food Insecurity: No Food Insecurity   Worried About Charity fundraiser in the Last Year: Never true   Ran Out of Food in the Last Year: Never true  Transportation Needs: No Transportation Needs   Lack of Transportation (Medical): No   Lack of Transportation (Non-Medical): No  Physical Activity: Sufficiently Active   Days of Exercise per Week: 3 days   Minutes of Exercise per Session: 60 min  Stress: No Stress Concern Present   Feeling of Stress : Not at all  Social Connections: Socially Isolated   Frequency of Communication with Friends and Family: More than three times a week   Frequency of Social Gatherings with Friends and Family: More than three times a week   Attends Religious Services: Never   Marine scientist or Organizations: No   Attends Music therapist: Never   Marital Status: Divorced  Human resources officer Violence: Not At Risk   Fear of Current or Ex-Partner: No   Emotionally Abused: No   Physically Abused: No   Sexually Abused: No    Subjective: Review of Systems  Constitutional:  Negative for chills and fever.   HENT:  Negative for congestion and hearing loss.   Eyes:  Negative for blurred vision and double vision.  Respiratory:  Negative for cough and shortness of breath.   Cardiovascular:  Negative for chest pain and palpitations.  Gastrointestinal:  Negative for abdominal pain, blood in stool, constipation, diarrhea, heartburn, melena and vomiting.  Genitourinary:  Negative for dysuria and urgency.  Musculoskeletal:  Negative for joint pain and myalgias.  Skin:  Negative for itching and rash.  Neurological:  Negative for dizziness and headaches.  Psychiatric/Behavioral:  Negative for depression. The patient is not nervous/anxious.       Objective: BP (!) 145/82   Pulse 76   Temp (!) 97.1 F (36.2 C) (Temporal)   Ht $R'5\' 4"'Cm$  (1.626 m)   Wt (!) 325 lb 6.4 oz (147.6 kg)   BMI 55.85 kg/m  Physical Exam Constitutional:      Appearance: Normal appearance.  HENT:     Head: Normocephalic and atraumatic.  Eyes:     Extraocular Movements: Extraocular movements  intact.     Conjunctiva/sclera: Conjunctivae normal.  Cardiovascular:     Rate and Rhythm: Normal rate and regular rhythm.  Pulmonary:     Effort: Pulmonary effort is normal.     Breath sounds: Normal breath sounds.  Abdominal:     General: Bowel sounds are normal.     Palpations: Abdomen is soft.  Musculoskeletal:        General: No swelling. Normal range of motion.     Cervical back: Normal range of motion and neck supple.  Skin:    General: Skin is warm and dry.     Coloration: Skin is not jaundiced.  Neurological:     General: No focal deficit present.     Mental Status: She is alert and oriented to person, place, and time.  Psychiatric:        Mood and Affect: Mood normal.        Behavior: Behavior normal.     Assessment: *Abnormal liver function test *Fatty liver disease  Plan: Etiology of acute rising LFTs unclear.  Possible medication induced from rosuvastatin which has since been discontinued.  I will perform  full serological work-up today to rule out other causes.  May need to consider liver biopsy pending above work-up.  Discussed fatty liver disease in depth with patient today.  Recommended 10% weight loss.  I will also print her out information in regards to this diagnosis.  Recommended keeping her cholesterol and diabetes under good control.  Thank you Dr. Moshe Cipro for the kind referral.   Disclaimer: This note was dictated with voice recognition software. Similar sounding words can inadvertently be transcribed and may not be corrected upon review.

## 2021-01-15 LAB — IMMUNOGLOBULINS A/E/G/M, SERUM
IgE (Immunoglobulin E), Serum: 83 IU/mL (ref 6–495)
IgG (Immunoglobin G), Serum: 1540 mg/dL (ref 586–1602)
IgM (Immunoglobulin M), Srm: 40 mg/dL (ref 26–217)

## 2021-01-15 LAB — CELIAC AB TTG DGP TIGA
Antigliadin Abs, IgA: 4 units (ref 0–19)
Gliadin IgG: 1 units (ref 0–19)
IgA/Immunoglobulin A, Serum: 325 mg/dL (ref 87–352)
Tissue Transglut Ab: <2 U/mL (ref 0–5)
Transglutaminase IgA: <2 U/mL (ref 0–3)

## 2021-01-15 LAB — ANA: ANA Titer 1: NEGATIVE

## 2021-01-18 ENCOUNTER — Ambulatory Visit (INDEPENDENT_AMBULATORY_CARE_PROVIDER_SITE_OTHER): Payer: Medicare Other | Admitting: Neurology

## 2021-01-18 ENCOUNTER — Encounter: Payer: Self-pay | Admitting: Neurology

## 2021-01-18 ENCOUNTER — Other Ambulatory Visit: Payer: Self-pay | Admitting: Nurse Practitioner

## 2021-01-18 VITALS — BP 146/83 | HR 80 | Ht 64.0 in | Wt 321.0 lb

## 2021-01-18 DIAGNOSIS — Z9989 Dependence on other enabling machines and devices: Secondary | ICD-10-CM | POA: Diagnosis not present

## 2021-01-18 DIAGNOSIS — G4733 Obstructive sleep apnea (adult) (pediatric): Secondary | ICD-10-CM | POA: Diagnosis not present

## 2021-01-18 NOTE — Progress Notes (Signed)
Subjective:    [name] is a 60 y.o. female.  HPI    Interim history:   Angela [name] is a 60 year old right-handed woman with an underlying medical history of recurrent vertigo, allergic rhinitis, asthma, hypertension, chronic back pain, s/p 4 back surgeries (Dr. Joya Salm x 2, Dr. Harl Bowie x 2), on chronic narcotic pain medication, oxygen dependence, and morbid obesity, who presents for follow-up consultation of her OSA, on CPAP therapy. The patient is accompanied by her daughter today and presents for her yearly checkup. She missed an appointment with Ward Givens, NP on 01/11/2021. I last saw her on 11/14/2017, at which time she was compliant with her CPAP and doing well.  She had been diagnosed with psoriasis.  She also reported a recent syncopal spell.    Today, 01/18/2021: I reviewed her CPAP compliance data from 12/19/2020 through 01/17/2021, which is a total of 30 days, during which time she used her machine every night with percent use days greater than 4 hours at 100%, indicating superb compliance with an average usage of 7 hours and 40 minutes, residual AHI at goal at 1.4/h, leak on the high side with a 95th percentile at 22.7 L/min on a pressure of 10 cm with EPR of 3.  She has been on oxygen supplementation due to oxygen values dropping into the 80s with minimal exertion.  She reports that she does not use her oxygen when she uses her CPAP at night.  She has been working on increasing her exercise stamina.  She has joined the [facility] and has just started using the treadmill for 15 minutes.  She uses her oxygen at the time.  She is typically up-to-date with her CPAP supplies but has not changed the filter in months or maybe a few years even.  She was shown today where the filter is.  It clearly needed replacement.  Her daughter reports that they do have a replacement filter at the house.  Patient reports that she has a lump in the left breast and that she did not want a biopsy done.     The patient's allergies, current medications, family history, past medical history, past social history, past surgical history and problem list were reviewed and updated as appropriate.    Previously (copied from previous notes for reference):    She saw Ward Givens, NP on 12/31/2018, at which time she was fully compliant with her CPAP and doing well.   I saw her on 11/09/2016, at which time she was compliant with her CPAP and reported that sleep was much improved sleep quality and sleep consolidation were better, nocturia had improved and daytime symptoms were better. We talked about the importance of full compliance with CPAP and working on weight loss.   I reviewed her CPAP compliance data from 10/14/2017 through 11/12/2017 which is a total of 30 days, during which time she used her CPAP 26 days with percent used days greater than 4 hours at 80%, indicating very good compliance with an average usage of 8 hours and 11 minutes, residual AHI at goal at 1.8 per hour, leak acceptable with the 95th percentile at 11.4 L/m on a pressure of 10 cm with EPR of 3.  I first met her on 12/29/2015 at the request of her primary care physician, at which time she reported snoring and excessive daytime somnolence as well as multiple nighttime awakenings. She was on narcotic pain medication at the time through Dr. Brien Few. I invited her for a sleep  study. She had a split-night sleep study on 02/03/2016. Baseline sleep efficiency was 71.8%, sleep latency prolonged at 40.5 minutes, REM sleep and slow-wave sleep were absent. Average oxygen saturation was 97%, nadir was 86% during non-REM sleep, total AHI 36.7 per hour. She was  titrated on CPAP from 5 cm to 10 cm. She did not achieve REM sleep during the titration portion of the study, average oxygen saturation was 90%, nadir was 87%. She did not have any significant PLMS or EKG changes during the study. Based on her test results are prescribed CPAP therapy for home use.    I reviewed her CPAP compliance data from 10/09/2016 through 11/07/2016, which is a total of 30 days, during which time she used her CPAP every night with percent used days greater than 4 hours at 83%, indicating very good compliance with an average usage of 6 hours and 36 minutes, residual AHI 1.9 per hour, leak acceptable for the 95th percentile at 10.9 L/m on a pressure of 10 cm with EPR of 3.  12/29/2015: (She) reports snoring and excessive daytime somnolence. I reviewed your office note from 12/03/2015. She reports multiple nighttime awakenings and also waking up with a sense of choking, wakes up coughing as well. Asthma has been worsening. She has been oxycontin bid, but not when planning to drive.  She takes zanaflex qid. She has nocturia up to 6 times per night.    She sleeps elevated with up to 6 pillows. Her Epworth sleepiness score is 7 out of 24 today, her fatigue score is 29 out of 63. She does not keep a set bedtime and wake time. May be in bed by 7:30 PM, wakeup time around 6 AM perhaps. She has seen Dr. Craige Cotta and was supposed to have a sleep study in the past but decided not to pursue it. She is still reluctant to come back for sleep study as she does not want anybody watching her sleep and she does not think she would be able to sleep. Nevertheless, she will think about it. She is congested currently in says she has a head cold. She has no history of restless leg symptoms. She has no family history of OSA. Mother died of cervical cancer and father died of prostate cancer. She is one of a total of 8 kids, none of her 7 siblings have OSA. She has one daughter, age 88, who lives locally. She has no grandchildren. She does not work. She was working as a Games developer in the past and was supposed to have further training to become an LPN. She is a nonsmoker and does not drink alcohol. She drinks several sodas per day. She lives alone.    Her Past Medical History Is Significant For: Past Medical  History:  Diagnosis Date   Allergy    Phreesia 06/02/2020   Anxiety    Arthritis    Asthma    Asthma    Phreesia 04/12/2020   Breast mass    left (monitor)   CHF (congestive heart failure) (HCC)    Phreesia 04/12/2020   Coronary atherosclerosis of native coronary artery    Mild nonobstructive CAD by cardiac catheterization 2008 - Dr. Sharyn Lull   Degenerative disc disease    Depression    Depression    Phreesia 04/12/2020   Diabetes mellitus without complication (HCC)    Phreesia 04/12/2020   Dysrhythmia    Essential hypertension, benign    Folliculitis 01/21/2015   GERD (gastroesophageal reflux disease)  H/O hiatal hernia    Helicobacter pylori gastritis 2008   Hypercholesterolemia    Hypertension    Hyperthyroidism    s/p radiation   Left-sided epistaxis 05/15/2018   Low back pain    Migraine    Nephrolithiasis    Recurring episodes since 2004   Neuromuscular disorder (HCC)    Phreesia 04/12/2020   Severe obesity (BMI >= 40) (HCC)    Sleep apnea    STOP BANG SCORE 6no cpap used   Thyroid disease    Phreesia 04/12/2020   Type 2 diabetes mellitus with diabetic neuropathy (HCC)     Her Past Surgical History Is Significant For: Past Surgical History:  Procedure Laterality Date   ABDOMINAL HYSTERECTOMY     BACK SURGERY  1993   BACK SURGERY  1994   BACK SURGERY  2002   BACK SURGERY  2011   [facility]    CARPAL TUNNEL RELEASE Left    CHOLECYSTECTOMY  1996   COLONOSCOPY  2000 [facility]   INT HEMORRHOIDS/FISSURE   COLONOSCOPY  2003 [facility], CHANGE IN BOWEL HABITS   INT HEMORRHOIDS   COLONOSCOPY  2006 [facility]   INT HEMORRHOIDS   COLONOSCOPY  2007 [facility]   INT HEMORRHOIDS   CYSTOSCOPY WITH STENT PLACEMENT Left 07/25/2012   Procedure: CYSTOSCOPY, left retograde pyelogram WITH left ureteral  STENT PLACEMENT;  Surgeon: Garnett Farm, MD;  Location: [facility];  Service: Urology;  Laterality: Left;   CYSTOSCOPY/RETROGRADE/URETEROSCOPY  12/04/2011   Procedure:  CYSTOSCOPY/RETROGRADE/URETEROSCOPY;  Surgeon: Milford Cage, MD;  Location: [facility];  Service: Urology;  Laterality: Right;   CYSTOSCOPY/RETROGRADE/URETEROSCOPY/STONE EXTRACTION WITH BASKET Left 09/03/2012   Procedure: CYSTOSCOPY/RETROGRADE pyelogram/digital URETEROSCOPY/STONE EXTRACTION WITH BASKET, left stent removal;  Surgeon: Milford Cage, MD;  Location: [facility];  Service: Urology;  Laterality: Left;   ESOPHAGOGASTRODUODENOSCOPY  2008   [facility]:OEVHR hiatal hernia./Normal esophagus without evidence of Barrett's mass, stricture, erosion or ulceration./Normal duodenal bulb and second portion of the duodenum./Diffuse erythema in the body and the antrum with occasional erosion.  Biopsies obtained via cold forceps to evaluate for H. pylori gastritis   FRACTURE SURGERY  1999   right clavicle   HOLMIUM LASER APPLICATION Left 09/03/2012   Procedure: HOLMIUM LASER APPLICATION;  Surgeon: Milford Cage, MD;  Location: [facility];  Service: Urology;  Laterality: Left;   KNEE ARTHROSCOPY  10/04/2010,    right knee arthroscopy, dr Thomasena Edis   LEFT HEART CATH AND CORONARY ANGIOGRAPHY N/A 05/08/2018   Procedure: LEFT HEART CATH AND CORONARY ANGIOGRAPHY;  Surgeon: Lennette Bihari, MD;  Location: [facility];  Service: Cardiovascular;  Laterality: N/A;   left knee arthroscopic surgery  1999   Left salphingectomy secondary to ectopic pregnancy  1991   PARTIAL HYSTERECTOMY  1991   RIGHT HEART CATH N/A 07/07/2019   Procedure: RIGHT HEART CATH;  Surgeon: Marykay Lex, MD;  Location: [facility]VASIVE CV LAB;  Service: Cardiovascular;  Laterality: N/A;   rotary cuff  Right 05/14/2014   [facility]   SPINE SURGERY N/A    Phreesia 06/02/2020   UPPER GASTROINTESTINAL ENDOSCOPY  2008 abd pain   H. pylori gastritis   UPPER GASTROINTESTINAL ENDOSCOPY  [place] [facility] [place]   [facility]    Her Family History Is Significant For: Family History  Problem Relation Age of Onset   Heart disease Mother    Hypertension  Mother    Cancer Mother        Cervical    Asthma Mother  Heart attack Father    Cancer Father        Prostate   Colon cancer Father        DECEASED AGE 31   Colon polyps Neg Hx     Her Social History Is Significant For: Social History   Socioeconomic History   Marital status: Single    Spouse name: Not on file   Number of children: 1   Years of education: 12   Highest education level: 12th grade  Occupational History   Occupation: Disable   Tobacco Use   Smoking status: Never   Smokeless tobacco: Never  Vaping Use   Vaping Use: Never used  Substance and Sexual Activity   Alcohol use: No   Drug use: No   Sexual activity: Not Currently    Partners: Male    Birth control/protection: Surgical    Comment: hyst  Other Topics Concern   Not on file  Social History Narrative   Lives alone    Social Determinants of Health   Financial Resource Strain: Low Risk    Difficulty of Paying Living Expenses: Not hard at all  Food Insecurity: No Food Insecurity   Worried About Charity fundraiser in the Last Year: Never true   [place] in the Last Year: Never true  Transportation Needs: No Transportation Needs   Lack of Transportation (Medical): No   Lack of Transportation (Non-Medical): No  Physical Activity: Sufficiently Active   Days of Exercise per Week: 3 days   Minutes of Exercise per Session: 60 min  Stress: No Stress Concern Present   Feeling of Stress : Not at all  Social Connections: Socially Isolated   Frequency of Communication with Friends and Family: More than three times a week   Frequency of Social Gatherings with Friends and Family: More than three times a week   Attends Religious Services: Never   Marine scientist or Organizations: No   Attends Archivist Meetings: Never   Marital Status: Divorced    Her Allergies Are:  Allergies  Allergen Reactions   Latex Itching and Rash   Morphine Other (See Comments)    Reaction with  esophagus, unable to swallow.    Morphine And Related Shortness Of Breath and Swelling   Other Itching, Shortness Of Breath and Swelling   Ace Inhibitors Cough   Aspirin Other (See Comments)    Reaction with esophagus, unable to swallow.    Losartan Cough   Tapentadol Other (See Comments)    Nausea, increased sleepiness, h/a   Tomato Other (See Comments)    Acid reflux due to acid in tomato   Adhesive [Tape] Rash  :   Her Current Medications Are:  Outpatient Encounter Medications as of 01/18/2021  Medication Sig   albuterol (VENTOLIN HFA) 108 (90 Base) MCG/ACT inhaler Inhale 2 puffs into the lungs every 6 (six) hours as needed for wheezing or shortness of breath.   amLODipine (NORVASC) 10 MG tablet TAKE 1 TABLET BY MOUTH EVERY DAY   benzonatate (TESSALON) 100 MG capsule Take 1 capsule (100 mg total) by mouth 2 (two) times daily as needed for cough.   betamethasone, augmented, (DIPROLENE) 0.05 % lotion Apply 1 application topically 2 (two) times daily.   blood glucose meter kit and supplies Dispense based on patient and insurance preference. Once daily testing dx. e11.9   calcipotriene (DOVONOX) 0.005 % ointment Apply 1 application topically daily.    clobetasol cream (TEMOVATE) 0.05 %  Apply 1 application topically 2 (two) times daily.   cloNIDine (CATAPRES - DOSED IN MG/24 HR) 0.3 mg/24hr patch APPLY 1 PATCH TO SKIN ONCE A WEEK   cloNIDine (CATAPRES) 0.3 MG tablet TAKE 1 TABLET (0.3 MG TOTAL) BY MOUTH AT BEDTIME.   cyclobenzaprine (FLEXERIL) 10 MG tablet TAKE 1 TABLET BY MOUTH THREE TIMES A DAY   diazepam (VALIUM) 5 MG tablet Take one tablet  by mouth three times daily for muscle spasm   DULoxetine (CYMBALTA) 60 MG capsule TAKE 1 CAPSULE BY MOUTH TWICE A DAY   ergocalciferol (VITAMIN D2) 1.25 MG (50000 UT) capsule Take 1 capsule (50,000 Units total) by mouth once a week. One capsule once weekly   esomeprazole (NEXIUM) 40 MG capsule TAKE 1 CAPSULE BY MOUTH EVERY DAY   fluticasone  (FLONASE) 50 MCG/ACT nasal spray Place 2 sprays into both nostrils daily.   furosemide (LASIX) 40 MG tablet TAKE 1 TABLET BY MOUTH EVERY DAY   gabapentin (NEURONTIN) 800 MG tablet Take 1 tablet (800 mg total) by mouth 4 (four) times daily.   glipiZIDE (GLUCOTROL XL) 10 MG 24 hr tablet TAKE 1 TABLET BY MOUTH EVERY DAY WITH BREAKFAST   hydrOXYzine (ATARAX/VISTARIL) 10 MG tablet Take 10 mg by mouth in the morning, at noon, and at bedtime.   Insulin Lispro Prot & Lispro (HUMALOG MIX 75/25 KWIKPEN) (75-25) 100 UNIT/ML Kwikpen Inject 65 Units into the skin 2 (two) times daily with a meal.   Insulin Pen Needle (B-D UF III MINI PEN NEEDLES) 31G X 5 MM MISC 1 each by Other route 2 (two) times daily. as directed   ipratropium-albuterol (DUONEB) 0.5-2.5 (3) MG/3ML SOLN Take 3 mLs by nebulization every 6 (six) hours as needed.   KLOR-CON M20 20 MEQ tablet TAKE 1 TABLET BY MOUTH TWICE A DAY   Lancets (ONETOUCH DELICA PLUS [id number]) MISC TEST ONCE DAILY   levothyroxine (SYNTHROID) 75 MCG tablet TAKE 1 TABLET BY MOUTH EVERY DAY   loratadine (CLARITIN) 10 MG tablet Take 1 tablet (10 mg total) by mouth daily.   Magnesium Oxide 400 MG CAPS Take one   meclizine (ANTIVERT) 25 MG tablet Take 25 mg by mouth in the morning, at noon, and at bedtime.   metFORMIN (GLUCOPHAGE) 500 MG tablet TAKE 1 TABLET BY MOUTH 2 TIMES DAILY WITH A MEAL.   metoprolol tartrate (LOPRESSOR) 25 MG tablet TAKE 1 TABLET BY MOUTH TWICE A DAY   naloxone (NARCAN) nasal spray 4 mg/0.1 mL One spray into each nostril once as needed   nitroGLYCERIN (NITROSTAT) 0.4 MG SL tablet Place 1 tablet (0.4 mg total) under the tongue every 5 (five) minutes as needed for chest pain.   ONETOUCH VERIO test strip USE AS INSTRUCTED FOUR TIMES DAILY TESTING DX E11.65   oxyCODONE (OXYCONTIN) 40 mg 12 hr tablet Take one tablet by mouth two times daily for chronic pain   [START ON 02/08/2021] oxyCODONE (OXYCONTIN) 40 mg 12 hr tablet Take one tablet by mouth two times  daily for chronic pain   spironolactone (ALDACTONE) 25 MG tablet TAKE 1 TABLET (25 MG TOTAL) BY MOUTH DAILY.   SYMBICORT 160-4.5 MCG/ACT inhaler TAKE 2 PUFFS BY MOUTH TWICE A DAY   triamterene-hydrochlorothiazide (MAXZIDE) 75-50 MG tablet Take 1 tablet by mouth daily.   UNABLE TO FIND Sharps container 6Y or PRN   zolpidem (AMBIEN) 10 MG tablet Take 1 tablet (10 mg total) by mouth at bedtime.   zolpidem (AMBIEN) 10 MG tablet Take 1 tablet (10 mg total)  by mouth at bedtime.   No facility-administered encounter medications on file as of 01/18/2021.  :  Review of Systems:  Out of a complete 14 point review of systems, all are reviewed and negative with the exception of these symptoms as listed below:  Review of Systems  Neurological:        Pt using cpap DME [employer]. Resmed DL printout in room, daughter is with pt.  She is on O2 24/7.   Objective:  Neurological Exam  Physical Exam Physical Examination:   Vitals:   01/18/21 0733  BP: (!) 146/83  Pulse: 80    General Examination: The patient is a very pleasant 60 y.o. female in no acute distress. She appears well-developed and well-nourished and well groomed.   HEENT: Normocephalic, atraumatic, pupils are equal, round and reactive to light, extraocular tracking is well-preserved, face is symmetric with normal facial animation.  She has oxygen via nasal cannula.  Speech is clear without dysarthria, hypophonia or voice tremor.  Neck is supple.  No carotid bruits.  Airway examination reveals moderate mouth dryness, stable findings otherwise, tongue protrudes centrally and palate elevates symmetrically.    Chest: Clear to auscultation without wheezing, rhonchi or crackles noted.   Heart: S1+S2+0, regular and normal without murmurs, rubs or gallops noted.    Abdomen: Soft, non-tender and non-distended.   Extremities: There is no pitting edema in the distal lower extremities bilaterally.   Skin: Warm and dry with scarring and  significant hyperpigmentation noted in both shin areas and slightly over both elbows.  Some psoriatic type changes on her arms.    Musculoskeletal: exam reveals no obvious joint deformities.  She walks with a single-point cane.     Neurologically:  Mental status: The patient is awake, alert and oriented in all 4 spheres. Her immediate and remote memory, attention, language skills and fund of knowledge are appropriate. There is no evidence of aphasia, agnosia, apraxia or anomia. Speech is clear with normal prosody and enunciation. Thought process is linear. Mood is normal and affect is normal.  Cranial nerves II - XII are as described above under HEENT exam. Motor exam: Normal bulk, strength and tone is noted. There is no tremor. Romberg is not tested for safety reasons. Fine motor skills and coordination: Grossly intact.  Cerebellar testing: No dysmetria or intention tremor. Sensory exam: intact to light touch.  Gait, station and balance: She stands with difficulty, walks with a limp, uses a single-point cane.   Assessment and Plan:  In summary, Angela [name] is a very pleasant 60 year old female with an underlying medical history of recurrent vertigo, allergic rhinitis, asthma, hypertension, arthritis, s/p b/l knee injections, R shoulder surgery, chronic LBP with s/p 4 back surgeries, on chronic narcotic pain medication, oxygen dependence and morbid obesity, who presents for follow-up consultation of her severe obstructive sleep apnea, well established on CPAP therapy. She had a split-night sleep study in October 2017. We reviewed her most recent compliance data. She is commended for her compliance.  She is up-to-date with her supplies but has not changed her filter in a long time.  She we will change the filter today.  She is doing well from the sleep apnea standpoint.  We talked about the importance of pursuing weight loss.  She is advised to follow-up routinely to see one of our nurse practitioners  in 1 year.  I answered all her questions today and the patient was in agreement.  I spent 30 minutes in total face-to-face  time and in reviewing records during pre-charting, more than 50% of which was spent in counseling and coordination of care, reviewing test results, reviewing medications and treatment regimen and/or in discussing or reviewing the diagnosis of OSA, the prognosis and treatment options. Pertinent laboratory and imaging test results that were available during this visit with the patient were reviewed by me and considered in my medical decision making (see chart for details).

## 2021-01-18 NOTE — Patient Instructions (Signed)
It was nice to see you again today.  You are fully compliant with your CPAP, I am glad you have joined the gym and have been able to use the treadmill.    Please continue using your CPAP regularly. While your insurance requires that you use CPAP at least 4 hours each night on 70% of the nights, I recommend, that you not skip any nights and use it throughout the night if you can. Getting used to CPAP and staying with the treatment long term does take time and patience and discipline. Untreated obstructive sleep apnea when it is moderate to severe can have an adverse impact on cardiovascular health and raise her risk for heart disease, arrhythmias, hypertension, congestive heart failure, stroke and diabetes. Untreated obstructive sleep apnea causes sleep disruption, nonrestorative sleep, and sleep deprivation. This can have an impact on your day to day functioning and cause daytime sleepiness and impairment of cognitive function, memory loss, mood disturbance, and problems focussing. Using CPAP regularly can improve these symptoms. Keep up the good work!  Please follow-up routinely to see Vaughan Browner, nurse practitioner in 1 year.

## 2021-01-19 ENCOUNTER — Other Ambulatory Visit: Payer: Self-pay | Admitting: Family Medicine

## 2021-01-19 DIAGNOSIS — E119 Type 2 diabetes mellitus without complications: Secondary | ICD-10-CM | POA: Diagnosis not present

## 2021-01-19 DIAGNOSIS — H524 Presbyopia: Secondary | ICD-10-CM | POA: Diagnosis not present

## 2021-01-19 DIAGNOSIS — Z7984 Long term (current) use of oral hypoglycemic drugs: Secondary | ICD-10-CM | POA: Diagnosis not present

## 2021-01-19 DIAGNOSIS — H2513 Age-related nuclear cataract, bilateral: Secondary | ICD-10-CM | POA: Diagnosis not present

## 2021-01-19 DIAGNOSIS — Z794 Long term (current) use of insulin: Secondary | ICD-10-CM | POA: Diagnosis not present

## 2021-01-19 LAB — HM DIABETES EYE EXAM

## 2021-01-31 ENCOUNTER — Ambulatory Visit: Payer: Medicare Other | Admitting: Podiatry

## 2021-02-03 DIAGNOSIS — R0609 Other forms of dyspnea: Secondary | ICD-10-CM | POA: Diagnosis not present

## 2021-02-07 ENCOUNTER — Ambulatory Visit (INDEPENDENT_AMBULATORY_CARE_PROVIDER_SITE_OTHER): Payer: Medicare Other | Admitting: Podiatry

## 2021-02-07 ENCOUNTER — Other Ambulatory Visit: Payer: Self-pay

## 2021-02-07 ENCOUNTER — Encounter: Payer: Self-pay | Admitting: Podiatry

## 2021-02-07 DIAGNOSIS — M79674 Pain in right toe(s): Secondary | ICD-10-CM

## 2021-02-07 DIAGNOSIS — B351 Tinea unguium: Secondary | ICD-10-CM

## 2021-02-07 DIAGNOSIS — Q828 Other specified congenital malformations of skin: Secondary | ICD-10-CM

## 2021-02-07 DIAGNOSIS — M79675 Pain in left toe(s): Secondary | ICD-10-CM | POA: Diagnosis not present

## 2021-02-07 DIAGNOSIS — E1142 Type 2 diabetes mellitus with diabetic polyneuropathy: Secondary | ICD-10-CM | POA: Diagnosis not present

## 2021-02-10 ENCOUNTER — Other Ambulatory Visit: Payer: Self-pay

## 2021-02-10 ENCOUNTER — Other Ambulatory Visit: Payer: Self-pay | Admitting: Family Medicine

## 2021-02-10 MED ORDER — ONETOUCH DELICA PLUS LANCET33G MISC: 0 refills | Status: DC

## 2021-02-11 ENCOUNTER — Other Ambulatory Visit: Payer: Self-pay | Admitting: Nurse Practitioner

## 2021-02-11 ENCOUNTER — Other Ambulatory Visit: Payer: Self-pay | Admitting: Family Medicine

## 2021-02-11 DIAGNOSIS — E1159 Type 2 diabetes mellitus with other circulatory complications: Secondary | ICD-10-CM

## 2021-02-11 DIAGNOSIS — M62830 Muscle spasm of back: Secondary | ICD-10-CM

## 2021-02-12 NOTE — Progress Notes (Signed)
Subjective:  [name], female    DOB: May 28, 1960,  MRN: [id number]  [name] presents to clinic today for painful porokeratotic lesion(s) left foot and painful mycotic toenails that limit ambulation. Painful toenails interfere with ambulation. Aggravating factors include wearing enclosed shoe gear. Pain is relieved with periodic professional debridement. Painful porokeratotic lesions are aggravated when weightbearing with and without shoegear. Pain is relieved with periodic professional debridement.  She has h/o diabetic neuropathy. Patient states blood glucose was 161 mg/dl today.    Her daughter is present during today's visit.  She voices no new pedal problems on today's visit.  PCP is Fayrene Helper, MD , and last visit was 12/02/2020.  Allergies  Allergen Reactions   Latex Itching and Rash   Morphine Other (See Comments)    Reaction with esophagus, unable to swallow.    Morphine And Related Shortness Of Breath and Swelling   Other Itching, Shortness Of Breath and Swelling   Ace Inhibitors Cough   Aspirin Other (See Comments)    Reaction with esophagus, unable to swallow.    Losartan Cough   Tapentadol Other (See Comments)    Nausea, increased sleepiness, h/a   Tomato Other (See Comments)    Acid reflux due to acid in tomato   Adhesive [Tape] Rash    Review of Systems: Negative except as noted in the HPI. Objective:   Constitutional Angela [name] is a pleasant 60 y.o. African American female, in NAD. AAO x 3.   Vascular Neurovascular status unchanged b/l lower extremities. Capillary refill time to digits immediate b/l. Palpable DP pulse(s) b/l lower extremities Palpable PT pulse(s) b/l lower extremities Pedal hair sparse. Lower extremity skin temperature gradient within normal limits. No pain with calf compression b/l. No edema noted b/l lower extremities. No cyanosis or clubbing noted.  Neurologic Normal speech. Oriented to person, place, and  time. Pt has subjective symptoms of neuropathy. Protective sensation decreased with 10 gram monofilament b/l. Vibratory sensation intact b/l.  Dermatologic Skin warm and supple b/l lower extremities. No open wounds b/l LE. No interdigital macerations b/l lower extremities. Toenails 1-5 b/l elongated, discolored, dystrophic, thickened, crumbly with subungual debris and tenderness to dorsal palpation. Porokeratotic lesion(s) submet head 5 left foot. No erythema, no edema, no drainage, no fluctuance.  Orthopedic: Normal muscle strength 5/5 to all lower extremity muscle groups bilaterally. Hallux valgus with bunion deformity noted b/l lower extremities. Utilizes cane for ambulation assistance.   Radiographs: None Assessment:   1. Pain due to onychomycosis of toenails of both feet   2. Porokeratosis   3. Diabetic peripheral neuropathy associated with type 2 diabetes mellitus ([place])    Plan:  Patient was evaluated and treated and all questions answered. Consent given for treatment as described below: -No new findings. No new orders. -Continue diabetic foot care principles: inspect feet daily, monitor glucose as recommended by PCP and/or Endocrinologist, and follow prescribed diet per PCP, Endocrinologist and/or dietician. -Patient to continue soft, supportive shoe gear daily. -Toenails 1-5 b/l were debrided in length and girth with sterile nail nippers and dremel without iatrogenic bleeding.  -Painful porokeratotic lesion(s) submet head 5 left foot pared and enucleated with sterile scalpel blade. Pinpoint bleeding addressed with Lumicain Hemostatic Solution. Triple antibiotic ointment applied. She is to apply Nesoporin once daily for one week. Total number of lesions debrided=1. -Patient to report any pedal injuries to medical professional immediately. -Patient/POA to call should there be question/concern in the interim.  Return in about 3 months (around  05/10/2021).  Marzetta Board, DPM

## 2021-02-15 ENCOUNTER — Telehealth: Payer: Self-pay | Admitting: Internal Medicine

## 2021-02-15 NOTE — Telephone Encounter (Signed)
*  STAT* If patient is at the pharmacy, call can be transferred to refill team.   1. Which medications need to be refilled? (please list name of each medication and dose if known) metoprolol tartrate (LOPRESSOR) 25 MG tablet  2. Which pharmacy/location (including street and city if local pharmacy) is medication to be sent to? [id number] - [place],  - [place]   3. Do they need a 30 day or 90 day supply? 90ds

## 2021-02-16 MED ORDER — METOPROLOL TARTRATE 25 MG PO TABS: 25.0000 mg | ORAL_TABLET | Freq: Two times a day (BID) | ORAL | 3 refills | Status: DC

## 2021-02-16 NOTE — Telephone Encounter (Signed)
Refills has been sent to the pharmacy. 

## 2021-02-24 ENCOUNTER — Other Ambulatory Visit: Payer: Self-pay

## 2021-02-24 ENCOUNTER — Ambulatory Visit (INDEPENDENT_AMBULATORY_CARE_PROVIDER_SITE_OTHER): Payer: Medicare Other | Admitting: Licensed Clinical Social Worker

## 2021-02-24 DIAGNOSIS — F4310 Post-traumatic stress disorder, unspecified: Secondary | ICD-10-CM

## 2021-02-24 DIAGNOSIS — F322 Major depressive disorder, single episode, severe without psychotic features: Secondary | ICD-10-CM

## 2021-03-01 ENCOUNTER — Encounter (HOSPITAL_COMMUNITY): Payer: Self-pay | Admitting: *Deleted

## 2021-03-01 ENCOUNTER — Emergency Department (HOSPITAL_COMMUNITY)
Admission: EM | Admit: 2021-03-01 | Discharge: 2021-03-01 | Disposition: A | Discharge status: Home / Self Care | Payer: Medicare Other | Attending: Emergency Medicine | Admitting: Emergency Medicine

## 2021-03-01 DIAGNOSIS — X58XXXA Exposure to other specified factors, initial encounter: Secondary | ICD-10-CM | POA: Diagnosis not present

## 2021-03-01 DIAGNOSIS — F419 Anxiety disorder, unspecified: Secondary | ICD-10-CM | POA: Insufficient documentation

## 2021-03-01 DIAGNOSIS — Z5321 Procedure and treatment not carried out due to patient leaving prior to being seen by health care provider: Secondary | ICD-10-CM | POA: Insufficient documentation

## 2021-03-01 DIAGNOSIS — T17208A Unspecified foreign body in pharynx causing other injury, initial encounter: Secondary | ICD-10-CM | POA: Insufficient documentation

## 2021-03-01 DIAGNOSIS — R739 Hyperglycemia, unspecified: Secondary | ICD-10-CM | POA: Diagnosis not present

## 2021-03-01 NOTE — ED Triage Notes (Signed)
Patient states she had a few words with her daughter today and after felt like she had a knot in her throat. History of anxiety. States her daughter is her paid caretaker and does not show up at times. States she was advised she had some type of knot in her breast during the summer summer and is concerned it may have spread in her throat.

## 2021-03-01 NOTE — ED Notes (Signed)
Decided to go home and see her PCP as scheduled this week, unable to sign because no e sig pad available

## 2021-03-02 ENCOUNTER — Telehealth (INDEPENDENT_AMBULATORY_CARE_PROVIDER_SITE_OTHER): Payer: Medicare Other | Admitting: Licensed Clinical Social Worker

## 2021-03-02 DIAGNOSIS — F322 Major depressive disorder, single episode, severe without psychotic features: Secondary | ICD-10-CM

## 2021-03-02 DIAGNOSIS — F4310 Post-traumatic stress disorder, unspecified: Secondary | ICD-10-CM

## 2021-03-02 NOTE — BH Specialist Note (Signed)
[facility] BH Follow Up Assessment  MRN: [id number] NAME: Angela [name] Date: 03/02/21  Start time: 2 End time: 220 Total time: 20  Type of Contact: Follow up Call  Current concerns/stressors: poor mood  Screens/Assessment Tools: PHQ9 SCORE ONLY 11/30/2020 11/24/2020 09/20/2020  PHQ-9 Total Score 18 12 4      Functional Assessment:  Sleep: fair Appetite: fair Coping ability: overwhelmed Patient taking medications as prescribed:    Current medications:  Outpatient Encounter Medications as of 02/24/2021  Medication Sig   albuterol (VENTOLIN HFA) 108 (90 Base) MCG/ACT inhaler Inhale 2 puffs into the lungs every 6 (six) hours as needed for wheezing or shortness of breath.   amLODipine (NORVASC) 10 MG tablet TAKE 1 TABLET BY MOUTH EVERY DAY   B-D UF III MINI PEN NEEDLES 31G X 5 MM MISC USE AS DIRECTED TWICE A DAY   benzonatate (TESSALON) 100 MG capsule Take 1 capsule (100 mg total) by mouth 2 (two) times daily as needed for cough.   betamethasone, augmented, (DIPROLENE) 0.05 % lotion Apply 1 application topically 2 (two) times daily.   blood glucose meter kit and supplies Dispense based on patient and insurance preference. Once daily testing dx. e11.9   calcipotriene (DOVONOX) 0.005 % ointment Apply 1 application topically daily.    clobetasol cream (TEMOVATE) 0.05 % Apply 1 application topically 2 (two) times daily.   cloNIDine (CATAPRES - DOSED IN MG/24 HR) 0.3 mg/24hr patch APPLY 1 PATCH TO SKIN ONCE A WEEK   cloNIDine (CATAPRES) 0.3 MG tablet TAKE 1 TABLET (0.3 MG TOTAL) BY MOUTH AT BEDTIME.   cyclobenzaprine (FLEXERIL) 10 MG tablet TAKE 1 TABLET BY MOUTH THREE TIMES A DAY   diazepam (VALIUM) 5 MG tablet Take one tablet  by mouth three times daily for muscle spasm   DULoxetine (CYMBALTA) 60 MG capsule TAKE 1 CAPSULE BY MOUTH TWICE A DAY   ergocalciferol (VITAMIN D2) 1.25 MG (50000 UT) capsule Take 1 capsule (50,000 Units total) by mouth once a week. One capsule once  weekly   esomeprazole (NEXIUM) 40 MG capsule TAKE 1 CAPSULE BY MOUTH EVERY DAY   fluticasone (FLONASE) 50 MCG/ACT nasal spray Place 2 sprays into both nostrils daily.   furosemide (LASIX) 40 MG tablet TAKE 1 TABLET BY MOUTH EVERY DAY   gabapentin (NEURONTIN) 800 MG tablet Take 1 tablet (800 mg total) by mouth 4 (four) times daily.   glipiZIDE (GLUCOTROL XL) 10 MG 24 hr tablet TAKE 1 TABLET BY MOUTH EVERY DAY WITH BREAKFAST   hydrOXYzine (ATARAX/VISTARIL) 10 MG tablet Take 10 mg by mouth in the morning, at noon, and at bedtime.   Insulin Lispro Prot & Lispro (HUMALOG MIX 75/25 KWIKPEN) (75-25) 100 UNIT/ML Kwikpen Inject 65 Units into the skin 2 (two) times daily with a meal.   ipratropium-albuterol (DUONEB) 0.5-2.5 (3) MG/3ML SOLN Take 3 mLs by nebulization every 6 (six) hours as needed.   KLOR-CON M20 20 MEQ tablet TAKE 1 TABLET BY MOUTH TWICE A DAY   Lancets (ONETOUCH DELICA PLUS LANCET33G) MISC TEST ONCE DAILY   levothyroxine (SYNTHROID) 75 MCG tablet TAKE 1 TABLET BY MOUTH EVERY DAY   loratadine (CLARITIN) 10 MG tablet TAKE 1 TABLET BY MOUTH EVERY DAY   Magnesium Oxide 400 MG CAPS Take one   meclizine (ANTIVERT) 25 MG tablet Take 25 mg by mouth in the morning, at noon, and at bedtime.   metFORMIN (GLUCOPHAGE) 500 MG tablet TAKE 1 TABLET BY MOUTH 2 TIMES DAILY WITH A MEAL.     metoprolol tartrate (LOPRESSOR) 25 MG tablet Take 1 tablet (25 mg total) by mouth 2 (two) times daily.   naloxone (NARCAN) nasal spray 4 mg/0.1 mL One spray into each nostril once as needed   nitroGLYCERIN (NITROSTAT) 0.4 MG SL tablet Place 1 tablet (0.4 mg total) under the tongue every 5 (five) minutes as needed for chest pain.   ONETOUCH VERIO test strip USE AS INSTRUCTED FOUR TIMES DAILY TESTING DX E11.65   oxyCODONE (OXYCONTIN) 40 mg 12 hr tablet Take one tablet by mouth two times daily for chronic pain   spironolactone (ALDACTONE) 25 MG tablet TAKE 1 TABLET (25 MG TOTAL) BY MOUTH DAILY.   SYMBICORT 160-4.5 MCG/ACT  inhaler TAKE 2 PUFFS BY MOUTH TWICE A DAY   triamterene-hydrochlorothiazide (MAXZIDE) 75-50 MG tablet Take 1 tablet by mouth daily.   UNABLE TO FIND Sharps container 6Y or PRN   zolpidem (AMBIEN) 10 MG tablet Take 1 tablet (10 mg total) by mouth at bedtime.   No facility-administered encounter medications on file as of 02/24/2021.    Self-harm and/or Suicidal Behaviors Risk Assessment Self-harm risk factors: no Patient endorses recent self injurious thoughts and/or behaviors: No   Suicide ideations: No plan to harm self or others   Danger to Others Risk Assessment Danger to others risk factors: no Patient endorses recent thoughts of harming others: No    Substance Use Assessment Patient recently consumed alcohol: No  Patient recently used drugs: No  Patient is concerned about dependence or abuse of substances: No    Goals, Interventions and Follow-up Plan Goals: Increase healthy adjustment to current life circumstances Interventions: Mindfulness or Relaxation Training and CBT Cognitive Behavioral Therapy   Summary of Clinical Assessment  Angela [name] is a 60 yr old woman who was present for her follow up.  She expressed that she has "given my thoughts and what happened to me to God." Her current symptoms are sadness, crying spells, decreased concentration, recurrent thoughts, and poor mood.   Angela [name] reports that she is going to visit her children (stepchildren) for Thanksgiving.  She is building a relationship with them and will spend a week with them. She reports that she takes her medication regularly.  Discussion of relaxation techniques  and coping skills to reduce intensity of symptoms.   Follow-up Plan:  Biweekly VBH session  M , LCSW  

## 2021-03-02 NOTE — BH Specialist Note (Signed)
Virtual [facility]al Health Treatment Plan Team Note  MRN: [id number] NAME: Angela [name]  DATE: 03/04/21  Start time:  340p End time:  345p Total time:  5 min  Total number of [facility] Treatment Team Plan encounters: 1/4  Treatment Team Attendees: Royal Piedra, LCSW & Dr. Modesta Messing  Diagnoses:    ICD-10-CM   1. PTSD (post-traumatic stress disorder)  F43.10     2. Depression, [place], single episode, severe (HCC)  F32.2       Goals, Interventions and Follow-up Plan Goals: Increase healthy adjustment to current life circumstances Interventions: Mindfulness or Relaxation Training CBT Cognitive Behavioral Therapy Medication Management Recommendations: no change in recommendation consider adding bupropion  Follow-up Plan: Biweekly VBH session  History of the present illness Presenting Problem/Current Symptoms: depressive and PTSD symptoms  Psychiatric History  Depression: No Anxiety: No Mania: No Psychosis: No PTSD symptoms: Yes  Past Psychiatric History/Hospitalization(s): Hospitalization for psychiatric illness: No Prior Suicide Attempts: No Prior Self-injurious behavior: No  Psychosocial stressors family conflict  Self-harm Behaviors Risk Assessment   Screenings PHQ-9 Assessments:  Depression screen [facility] 2/9 03/03/2021 03/02/2021 11/30/2020  Decreased Interest 0 3 3  Down, Depressed, Hopeless 0 3 3  PHQ - 2 Score 0 6 6  Altered sleeping 0 2 3  Tired, decreased energy 0 2 3  Change in appetite 0 2 2  Feeling bad or failure about yourself  0 2 2  Trouble concentrating 0 2 2  Moving slowly or fidgety/restless 0 2 0  Suicidal thoughts 0 0 0  PHQ-9 Score 0 18 18  Difficult doing work/chores - Very difficult Very difficult  Some recent data might be hidden   GAD-7 Assessments:  GAD 7 : Generalized Anxiety Score 11/30/2020 11/24/2020 10/10/2019  Nervous, Anxious, on Edge 1 2 0  Control/stop worrying 1 1 0  Worry too much - different things 1 1 0  Trouble relaxing 0  1 0  Restless 0 0 0  Easily annoyed or irritable 0 1 0  Afraid - awful might happen 0 1 0  Total GAD 7 Score 3 7 0  Anxiety Difficulty Not difficult at all Somewhat difficult Not difficult at all    Past Medical History Past Medical History:  Diagnosis Date   Allergy    Phreesia 06/02/2020   Anxiety    Arthritis    Asthma    Asthma    [employer]reesia 04/12/2020   Breast mass    left (monitor)   CHF (congestive heart failure) ([place])    [employer]reesia 04/12/2020   Coronary atherosclerosis of native coronary artery    Mild nonobstructive CAD by cardiac catheterization 2008 - Dr. Terrence Dupont   Degenerative disc disease    Depression    Depression    [employer]reesia 04/12/2020   Diabetes mellitus without complication ([place])    [employer]reesia 04/12/2020   Dysrhythmia    Essential hypertension, benign    Folliculitis 93/23/5573   GERD (gastroesophageal reflux disease)    H/O hiatal hernia    Helicobacter pylori gastritis 2008   Hypercholesterolemia    Hypertension    Hyperthyroidism    s/p radiation   Left-sided epistaxis 05/15/2018   Low back pain    Migraine    Nephrolithiasis    Recurring episodes since 2004   Neuromuscular disorder ([place])    [employer]reesia 04/12/2020   Severe obesity (BMI >= 40) (HCC)    Sleep apnea    STOP BANG SCORE 6no cpap used   Thyroid disease    Phreesia 04/12/2020  Type 2 diabetes mellitus with diabetic neuropathy (HCC)     Vital signs: There were no vitals filed for this visit.  Allergies:  Allergies as of 03/02/2021 - Review Complete 02/07/2021  Allergen Reaction Noted   Latex Itching and Rash 06/02/2020   Morphine Other (See Comments) 07/23/2007   Morphine and related Shortness Of Breath and Swelling 06/02/2020   Other Itching, Shortness Of Breath, and Swelling 04/12/2020   Ace inhibitors Cough 04/01/2013   Aspirin Other (See Comments) 07/23/2007   Losartan Cough 04/01/2013   Tapentadol Other (See Comments) 07/18/2016   Tomato Other (See Comments) 07/13/2013    Adhesive [tape] Rash 08/30/2012    Medication History Current medications:  Outpatient Encounter Medications as of 03/02/2021  Medication Sig   albuterol (VENTOLIN HFA) 108 (90 Base) MCG/ACT inhaler Inhale 2 puffs into the lungs every 6 (six) hours as needed for wheezing or shortness of breath.   amLODipine (NORVASC) 10 MG tablet TAKE 1 TABLET BY MOUTH EVERY DAY   B-D UF III MINI PEN NEEDLES 31G X 5 MM MISC USE AS DIRECTED TWICE A DAY   betamethasone, augmented, (DIPROLENE) 0.05 % lotion Apply 1 application topically 2 (two) times daily.   blood glucose meter kit and supplies Dispense based on patient and insurance preference. Once daily testing dx. e11.9   calcipotriene (DOVONOX) 0.005 % ointment Apply 1 application topically daily.    clobetasol cream (TEMOVATE) 1.74 % Apply 1 application topically 2 (two) times daily.   cloNIDine (CATAPRES - DOSED IN MG/24 HR) 0.3 mg/24hr patch APPLY 1 PATCH TO SKIN ONCE A WEEK   cloNIDine (CATAPRES) 0.3 MG tablet TAKE 1 TABLET (0.3 MG TOTAL) BY MOUTH AT BEDTIME.   cyclobenzaprine (FLEXERIL) 10 MG tablet TAKE 1 TABLET BY MOUTH THREE TIMES A DAY   diazepam (VALIUM) 5 MG tablet Take one tablet  by mouth three times daily for muscle spasm   DULoxetine (CYMBALTA) 60 MG capsule TAKE 1 CAPSULE BY MOUTH TWICE A DAY   ergocalciferol (VITAMIN D2) 1.25 MG (50000 UT) capsule Take 1 capsule (50,000 Units total) by mouth once a week. One capsule once weekly   esomeprazole (NEXIUM) 40 MG capsule TAKE 1 CAPSULE BY MOUTH EVERY DAY   fluticasone (FLONASE) 50 MCG/ACT nasal spray Place 2 sprays into both nostrils daily.   furosemide (LASIX) 40 MG tablet TAKE 1 TABLET BY MOUTH EVERY DAY   gabapentin (NEURONTIN) 800 MG tablet Take 1 tablet (800 mg total) by mouth 4 (four) times daily.   glipiZIDE (GLUCOTROL XL) 10 MG 24 hr tablet TAKE 1 TABLET BY MOUTH EVERY DAY WITH BREAKFAST   Insulin Lispro Prot & Lispro (HUMALOG MIX 75/25 KWIKPEN) (75-25) 100 UNIT/ML Kwikpen Inject 65  Units into the skin 2 (two) times daily with a meal.   ipratropium-albuterol (DUONEB) 0.5-2.5 (3) MG/3ML SOLN Take 3 mLs by nebulization every 6 (six) hours as needed.   KLOR-CON M20 20 MEQ tablet TAKE 1 TABLET BY MOUTH TWICE A DAY   Lancets (ONETOUCH DELICA PLUS YCXKGY18H) MISC TEST ONCE DAILY   levothyroxine (SYNTHROID) 75 MCG tablet TAKE 1 TABLET BY MOUTH EVERY DAY   loratadine (CLARITIN) 10 MG tablet TAKE 1 TABLET BY MOUTH EVERY DAY   metFORMIN (GLUCOPHAGE) 500 MG tablet TAKE 1 TABLET BY MOUTH 2 TIMES DAILY WITH A MEAL.   metoprolol tartrate (LOPRESSOR) 25 MG tablet Take 1 tablet (25 mg total) by mouth 2 (two) times daily.   naloxone (NARCAN) nasal spray 4 mg/0.1 mL One spray into each nostril  once as needed   nitroGLYCERIN (NITROSTAT) 0.4 MG SL tablet Place 1 tablet (0.4 mg total) under the tongue every 5 (five) minutes as needed for chest pain.   ONETOUCH VERIO test strip USE AS INSTRUCTED FOUR TIMES DAILY TESTING DX E11.65   oxyCODONE (OXYCONTIN) 40 mg 12 hr tablet Take one tablet by mouth two times daily for chronic pain   spironolactone (ALDACTONE) 25 MG tablet TAKE 1 TABLET (25 MG TOTAL) BY MOUTH DAILY.   SYMBICORT 160-4.5 MCG/ACT inhaler TAKE 2 PUFFS BY MOUTH TWICE A DAY   triamterene-hydrochlorothiazide (MAXZIDE) 75-50 MG tablet Take 1 tablet by mouth daily.   UNABLE TO FIND Sharps container 6Y or PRN   zolpidem (AMBIEN) 10 MG tablet Take 1 tablet (10 mg total) by mouth at bedtime.   zolpidem (AMBIEN) 10 MG tablet Take 1 tablet (10 mg total) by mouth at bedtime.   [DISCONTINUED] benzonatate (TESSALON) 100 MG capsule Take 1 capsule (100 mg total) by mouth 2 (two) times daily as needed for cough.   [DISCONTINUED] hydrOXYzine (ATARAX/VISTARIL) 10 MG tablet Take 10 mg by mouth in the morning, at noon, and at bedtime.   [DISCONTINUED] Magnesium Oxide 400 MG CAPS Take one   [DISCONTINUED] meclizine (ANTIVERT) 25 MG tablet Take 25 mg by mouth in the morning, at noon, and at bedtime.   No  facility-administered encounter medications on file as of 03/02/2021.     Scribe for Treatment Team: Lubertha South, LCSW

## 2021-03-03 ENCOUNTER — Encounter: Payer: Self-pay | Admitting: Family Medicine

## 2021-03-03 ENCOUNTER — Ambulatory Visit (HOSPITAL_COMMUNITY)
Admission: RE | Admit: 2021-03-03 | Discharge: 2021-03-03 | Disposition: A | Discharge status: Home / Self Care | Payer: Medicare Other | Source: Ambulatory Visit | Attending: Family Medicine | Admitting: Family Medicine

## 2021-03-03 ENCOUNTER — Other Ambulatory Visit: Payer: Self-pay

## 2021-03-03 ENCOUNTER — Ambulatory Visit (INDEPENDENT_AMBULATORY_CARE_PROVIDER_SITE_OTHER): Payer: Medicare Other | Admitting: Family Medicine

## 2021-03-03 ENCOUNTER — Other Ambulatory Visit: Payer: Self-pay | Admitting: Family Medicine

## 2021-03-03 VITALS — BP 128/78 | HR 83 | Resp 19 | Ht 64.0 in | Wt 317.0 lb

## 2021-03-03 DIAGNOSIS — Z0001 Encounter for general adult medical examination with abnormal findings: Secondary | ICD-10-CM

## 2021-03-03 DIAGNOSIS — M25551 Pain in right hip: Secondary | ICD-10-CM | POA: Insufficient documentation

## 2021-03-03 DIAGNOSIS — E1140 Type 2 diabetes mellitus with diabetic neuropathy, unspecified: Secondary | ICD-10-CM | POA: Diagnosis not present

## 2021-03-03 DIAGNOSIS — E1165 Type 2 diabetes mellitus with hyperglycemia: Secondary | ICD-10-CM

## 2021-03-03 DIAGNOSIS — R52 Pain, unspecified: Secondary | ICD-10-CM

## 2021-03-03 DIAGNOSIS — Z794 Long term (current) use of insulin: Secondary | ICD-10-CM | POA: Diagnosis not present

## 2021-03-03 DIAGNOSIS — Z23 Encounter for immunization: Secondary | ICD-10-CM

## 2021-03-03 DIAGNOSIS — R928 Other abnormal and inconclusive findings on diagnostic imaging of breast: Secondary | ICD-10-CM

## 2021-03-03 DIAGNOSIS — M25552 Pain in left hip: Secondary | ICD-10-CM

## 2021-03-03 MED ORDER — KETOROLAC TROMETHAMINE 60 MG/2ML IM SOLN
60.0000 mg | Freq: Once | INTRAMUSCULAR | Status: AC
Start: 2021-03-03 — End: 2021-03-03
  Administered 2021-03-03: 60 mg via INTRAMUSCULAR

## 2021-03-03 MED ORDER — HYDROXYZINE HCL 10 MG PO TABS: ORAL_TABLET | ORAL | 5 refills | Status: DC

## 2021-03-03 MED ORDER — ROSUVASTATIN CALCIUM 5 MG PO TABS: ORAL_TABLET | ORAL | 3 refills | Status: DC

## 2021-03-03 NOTE — Patient Instructions (Addendum)
F/U in 4 months, call if you need me sooner  Diagnostic mammogram and US of left breast due Jan 12 or after , please schedule  Hepatic panel in 2 months.  Toradol 60 mg IM today, and bilateral  hip x ray today for pain x 1 week  Fasting lipid , cmp and eGFr in 4 months  Congrats on improved blood sugar and weight loss  It is important that you exercise regularly at least 30 minutes 5 times a week. If you develop chest pain, have severe difficulty breathing, or feel very tired, stop exercising immediately and seek medical attention    Think about what you will eat, plan ahead. Choose " clean, green, fresh or frozen" over canned, processed or packaged foods which are more sugary, salty and fatty. 70 to 75% of food eaten should be vegetables and fruit. Three meals at set times with snacks allowed between meals, but they must be fruit or vegetables. Aim to eat over a 12 hour period , example 7 am to 7 pm, and STOP after  your last meal of the day. Drink water,generally about 64 ounces per day, no other drink is as healthy. Fruit juice is best enjoyed in a healthy way, by EATING the fruit.   Thanks for choosing [facility], we consider it a privelige to serve you.  

## 2021-03-03 NOTE — Progress Notes (Signed)
Angela [name]     MRN: [id number]      DOB: 24-Sep-1960  HPI: Patient is in for annual physical exam. C/o new bilateral hip pain rated between 8 to 10, no inciting trauma. Chronic pain management is addressed also Recent labs,  are reviewed. Immunization is reviewed , and  updated if needed.   PE: BP 128/78   Pulse 83   Resp 19   Ht 5\' 4"  (1.626 m)   Wt (!) 317 lb 0.6 oz (143.8 kg)   SpO2 98%   BMI 54.42 kg/m   Pleasant  female, alert and oriented x 3, in no cardio-pulmonary distress. Afebrile. HEENT No facial trauma or asymetry. Sinuses non tender.  Extra occullar muscles intact.. External ears normal, . Neck: supple, no adenopathy,JVD or thyromegaly.No bruits.  Chest: Clear to ascultation bilaterally.No crackles or wheezes. Non tender to palpation  Breast: Not examined  Cardiovascular system; Heart sounds normal,  S1 and  S2 ,no S3.  No murmur, or thrill. Apical beat not displaced Peripheral pulses normal.  Abdomen: Soft, non tender, no organomegaly or masses. al. No guarding, tenderness or rebound.   Musculoskeletal exam: Decreased  ROM of spine, hips , shoulders and knees.  deformity ,swelling or crepitus noted. .   Neurologic: Cranial nerves 2 to 12 intact. Power, tone ,sensation and reflexes normal throughout. disturbance in gait. No tremor.  Skin: Intact, no ulceration, erythema , scaling or rash noted. Pigmentation normal throughout  Psych; Normal mood and affect. Judgement and concentration normal   Assessment & Plan:  Annual visit for general adult medical examination with abnormal findings Annual exam as documented. Counseling done  re healthy lifestyle involving commitment to 150 minutes exercise per week, heart healthy diet, and attaining healthy weight.The importance of adequate sleep also discussed. Regular seat belt use and home safety, is also discussed. Changes in health habits are decided on by the patient with goals and time  frames  set for achieving them. Immunization and cancer screening needs are specifically addressed at this visit.   Hip pain, bilateral New bilateral hip pain, no inciting trauma, obtain x rays, and toradol 60 mg IM in office  Uncontrolled type 2 diabetes mellitus with hyperglycemia ([place]) Ms. Sgro is reminded of the importance of commitment to daily physical activity for 30 minutes or more, as able and the need to limit carbohydrate intake to 30 to 60 grams per meal to help with blood sugar control.   The need to take medication as prescribed, test blood sugar as directed, and to call between visits if there is a concern that blood sugar is uncontrolled is also discussed.   Ms. Missildine is reminded of the importance of daily foot exam, annual eye examination, and good blood sugar, blood pressure and cholesterol control.  Diabetic Labs Latest Ref Rng & Units 12/21/2020 11/24/2020 09/20/2020 06/22/2020 05/06/2020  HbA1c 4.0 - 5.6 % 8.9(A) - 10.0(A) 9.1(A) -  Microalbumin Not Estab. ug/mL - - - - -  Micro/Creat Ratio <30 mcg/mg creat - - - - -  Chol 100 - 199 mg/dL - 140 - - 197  HDL >39 mg/dL - 47 - - 44  Calc LDL 0 - 99 mg/dL - 74 - - 124(H)  Triglycerides 0 - 149 mg/dL - 102 - - 164(H)  Creatinine 0.57 - 1.00 mg/dL - 0.65 - - 0.90  GFR >60.00 mL/min - - - - -   BP/Weight 03/03/2021 03/01/2021 01/18/2021 01/07/2021 01/05/2021 7/41/6384 09/01/6466  Systolic BP 032  146 146 132 658 006 349  Diastolic BP 78 98 83 82 82 88 80  Wt. (Lbs) 317.04 - 321 322.2 325.4 324 325  BMI 54.42 - 55.1 55.31 55.85 55.61 55.79   Foot/eye exam completion dates Latest Ref Rng & Units 01/19/2021 02/11/2020  Eye Exam No Retinopathy No Retinopathy -  Foot Form Completion - - Done      Uncontrolled, managed by Endo  Encounter for pain management The patient's Controlled Substance registry is reviewed and compliance confirmed. Adequacy of  Pain control and level of function is assessed. Medication dosing is adjusted as  deemed appropriate. Twelve weeks of medication is prescribed , with a follow up appointment between 12 to 16 weeks .

## 2021-03-06 DIAGNOSIS — R0609 Other forms of dyspnea: Secondary | ICD-10-CM | POA: Diagnosis not present

## 2021-03-07 ENCOUNTER — Encounter: Payer: Self-pay | Admitting: Family Medicine

## 2021-03-07 DIAGNOSIS — M25551 Pain in right hip: Secondary | ICD-10-CM | POA: Insufficient documentation

## 2021-03-07 DIAGNOSIS — Z0001 Encounter for general adult medical examination with abnormal findings: Secondary | ICD-10-CM | POA: Insufficient documentation

## 2021-03-07 MED ORDER — OXYCODONE HCL ER 40 MG PO T12A: 40.0000 mg | EXTENDED_RELEASE_TABLET | Freq: Two times a day (BID) | ORAL | 0 refills | Status: AC

## 2021-03-07 MED ORDER — OXYCODONE HCL ER 40 MG PO T12A: EXTENDED_RELEASE_TABLET | ORAL | 0 refills | Status: AC

## 2021-03-07 MED ORDER — DIAZEPAM 5 MG PO TABS: ORAL_TABLET | ORAL | 5 refills | Status: DC

## 2021-03-07 NOTE — Assessment & Plan Note (Signed)
Angela [name] is reminded of the importance of commitment to daily physical activity for 30 minutes or more, as able and the need to limit carbohydrate intake to 30 to 60 grams per meal to help with blood sugar control.   The need to take medication as prescribed, test blood sugar as directed, and to call between visits if there is a concern that blood sugar is uncontrolled is also discussed.   Angela [name] is reminded of the importance of daily foot exam, annual eye examination, and good blood sugar, blood pressure and cholesterol control.  Diabetic Labs Latest Ref Rng & Units 12/21/2020 11/24/2020 09/20/2020 06/22/2020 05/06/2020  HbA1c 4.0 - 5.6 % 8.9(A) - 10.0(A) 9.1(A) -  Microalbumin Not Estab. ug/mL - - - - -  Micro/Creat Ratio <30 mcg/mg creat - - - - -  Chol 100 - 199 mg/dL - 140 - - 197  HDL >39 mg/dL - 47 - - 44  Calc LDL 0 - 99 mg/dL - 74 - - 124(H)  Triglycerides 0 - 149 mg/dL - 102 - - 164(H)  Creatinine 0.57 - 1.00 mg/dL - 0.65 - - 0.90  GFR >60.00 mL/min - - - - -   BP/Weight 03/03/2021 03/01/2021 01/18/2021 01/07/2021 01/05/2021 8/52/7782 08/01/3534  Systolic BP 144 315 400 867 619 509 326  Diastolic BP 78 98 83 82 82 88 80  Wt. (Lbs) 317.04 - 321 322.2 325.4 324 325  BMI 54.42 - 55.1 55.31 55.85 55.61 55.79   Foot/eye exam completion dates Latest Ref Rng & Units 01/19/2021 02/11/2020  Eye Exam No Retinopathy No Retinopathy -  Foot Form Completion - - Done      Uncontrolled, managed by Endo

## 2021-03-07 NOTE — Assessment & Plan Note (Signed)
New bilateral hip pain, no inciting trauma, obtain x rays, and toradol 60 mg IM in office

## 2021-03-07 NOTE — Assessment & Plan Note (Signed)

## 2021-03-07 NOTE — Assessment & Plan Note (Addendum)
The patient's Controlled Substance registry is reviewed and compliance confirmed. Adequacy of  Pain control and level of function is assessed. Medication dosing is adjusted as deemed appropriate. Twelve weeks of medication is prescribed , with a follow up appointment between 12 to 16 weeks .

## 2021-03-17 ENCOUNTER — Ambulatory Visit (INDEPENDENT_AMBULATORY_CARE_PROVIDER_SITE_OTHER): Payer: Medicare Other | Admitting: Licensed Clinical Social Worker

## 2021-03-17 ENCOUNTER — Other Ambulatory Visit: Payer: Self-pay

## 2021-03-17 DIAGNOSIS — E1165 Type 2 diabetes mellitus with hyperglycemia: Secondary | ICD-10-CM | POA: Diagnosis not present

## 2021-03-17 DIAGNOSIS — E039 Hypothyroidism, unspecified: Secondary | ICD-10-CM | POA: Diagnosis not present

## 2021-03-17 DIAGNOSIS — F322 Major depressive disorder, single episode, severe without psychotic features: Secondary | ICD-10-CM

## 2021-03-18 LAB — COMPREHENSIVE METABOLIC PANEL
ALT: 27 IU/L (ref 0–32)
AST: 26 IU/L (ref 0–40)
Albumin/Globulin Ratio: 1.3 (ref 1.2–2.2)
Albumin: 4.4 g/dL (ref 3.8–4.9)
Alkaline Phosphatase: 91 IU/L (ref 44–121)
BUN/Creatinine Ratio: 15 (ref 12–28)
BUN: 12 mg/dL (ref 8–27)
Bilirubin Total: 0.3 mg/dL (ref 0.0–1.2)
CO2: 23 mmol/L (ref 20–29)
Calcium: 9.7 mg/dL (ref 8.7–10.3)
Chloride: 100 mmol/L (ref 96–106)
Creatinine, Ser: 0.81 mg/dL (ref 0.57–1.00)
Globulin, Total: 3.4 g/dL (ref 1.5–4.5)
Glucose: 176 mg/dL — ABNORMAL HIGH (ref 70–99)
Potassium: 4.1 mmol/L (ref 3.5–5.2)
Sodium: 139 mmol/L (ref 134–144)
Total Protein: 7.8 g/dL (ref 6.0–8.5)
eGFR: 83 mL/min/{1.73_m2} (ref >59)

## 2021-03-18 LAB — T4, FREE: Free T4: 1.39 ng/dL (ref 0.82–1.77)

## 2021-03-18 LAB — TSH: TSH: 3.12 u[IU]/mL (ref 0.450–4.500)

## 2021-03-23 ENCOUNTER — Encounter: Payer: Self-pay | Admitting: Nurse Practitioner

## 2021-03-23 ENCOUNTER — Ambulatory Visit (INDEPENDENT_AMBULATORY_CARE_PROVIDER_SITE_OTHER): Payer: Medicare Other | Admitting: Nurse Practitioner

## 2021-03-23 VITALS — BP 112/70 | HR 81 | Ht 64.0 in | Wt 314.2 lb

## 2021-03-23 DIAGNOSIS — I10 Essential (primary) hypertension: Secondary | ICD-10-CM

## 2021-03-23 DIAGNOSIS — E039 Hypothyroidism, unspecified: Secondary | ICD-10-CM

## 2021-03-23 DIAGNOSIS — E1165 Type 2 diabetes mellitus with hyperglycemia: Secondary | ICD-10-CM

## 2021-03-23 DIAGNOSIS — E782 Mixed hyperlipidemia: Secondary | ICD-10-CM

## 2021-03-23 DIAGNOSIS — E559 Vitamin D deficiency, unspecified: Secondary | ICD-10-CM

## 2021-03-23 DIAGNOSIS — E1159 Type 2 diabetes mellitus with other circulatory complications: Secondary | ICD-10-CM

## 2021-03-23 LAB — POCT GLYCOSYLATED HEMOGLOBIN (HGB A1C): HbA1c, POC (controlled diabetic range): 9.1 % — AB (ref 0.0–7.0)

## 2021-03-23 MED ORDER — INSULIN LISPRO PROT & LISPRO (75-25 MIX) 100 UNIT/ML KWIKPEN: 30.0000 [IU] | PEN_INJECTOR | Freq: Two times a day (BID) | SUBCUTANEOUS | 3 refills | Status: DC

## 2021-03-23 NOTE — Patient Instructions (Signed)
Advice for Weight Management  -For most of us the best way to lose weight is by diet management. Generally speaking, diet management means consuming less calories intentionally which over time brings about progressive weight loss.  This can be achieved more effectively by restricting carbohydrate consumption to the minimum possible.  So, it is critically important to know your numbers: how much calorie you are consuming and how much calorie you need. More importantly, our carbohydrates sources should be unprocessed or minimally processed complex starch food items.   Sometimes, it is important to balance nutrition by increasing protein intake (animal or plant source), fruits, and vegetables.  -Sticking to a routine mealtime to eat 3 meals a day and avoiding unnecessary snacks is shown to have a big role in weight control. Under normal circumstances, the only time we lose real weight is when we are hungry, so allow hunger to take place- hunger means no food between meal times, only water.  It is not advisable to starve.   -It is better to avoid simple carbohydrates including: Cakes, Sweet Desserts, Ice Cream, Soda (diet and regular), Sweet Tea, Candies, Chips, Cookies, Store Bought Juices, Alcohol in Excess of  1-2 drinks a day, Artificial Sweeteners, Doughnuts, Coffee Creamers, "Sugar-free" Products, etc, etc.  This is not a complete list.....    -Consulting with certified diabetes educators is proven to provide you with the most accurate and current information on diet.  Also, you may be  interested in discussing diet options/exchanges , we can schedule a visit with Angela [name], RDN, CDE for individualized nutrition education.  -Exercise: If you are able: 30 -60 minutes a day ,4 days a week, or 150 minutes a week.  The longer the better.  Combine stretch, strength, and aerobic activities.  If you were told in the past that you have high risk for cardiovascular diseases, you may seek evaluation by  your heart doctor prior to initiating moderate to intense exercise programs.    

## 2021-03-23 NOTE — Progress Notes (Signed)
03/23/2021, 2:09 PM                    Endocrinology follow-up note   Subjective:    [name], female    DOB: June 27, 1960.  Angela [name] is being seen in follow-up in the management of currently uncontrolled symptomatic type 2 diabetes.  She also has comorbid dyslipidemia, hypertension.   PMD:  Angela Helper, MD.   Past Medical History:  Diagnosis Date   Allergy    Phreesia 06/02/2020   Anxiety    Arthritis    Asthma    Asthma    Phreesia 04/12/2020   Breast mass    left (monitor)   CHF (congestive heart failure) ([place])    Phreesia 04/12/2020   Coronary atherosclerosis of native coronary artery    Mild nonobstructive CAD by cardiac catheterization 2008 - Dr. Terrence Dupont   Degenerative disc disease    Depression    Depression    Phreesia 04/12/2020   Diabetes mellitus without complication ([place])    Phreesia 04/12/2020   Dysrhythmia    Essential hypertension, benign    Folliculitis 03/03/1593   GERD (gastroesophageal reflux disease)    H/O hiatal hernia    Helicobacter pylori gastritis 2008   Hypercholesterolemia    Hypertension    Hyperthyroidism    s/p radiation   Left-sided epistaxis 05/15/2018   Low back pain    Migraine    Nephrolithiasis    Recurring episodes since 2004   Neuromuscular disorder ([place])    Phreesia 04/12/2020   Severe obesity (BMI >= 40) (HCC)    Sleep apnea    STOP BANG SCORE 6no cpap used   Thyroid disease    Phreesia 04/12/2020   Type 2 diabetes mellitus with diabetic neuropathy ([place])     Past Surgical History:  Procedure Laterality Date   [place]  2000 [facility]   INT HEMORRHOIDS/FISSURE   COLONOSCOPY  2003 [facility], CHANGE IN BOWEL HABITS   INT HEMORRHOIDS   COLONOSCOPY  2006 [facility]   INT HEMORRHOIDS    COLONOSCOPY  2007 [facility]   INT HEMORRHOIDS   CYSTOSCOPY WITH STENT PLACEMENT Left 07/25/2012   Procedure: CYSTOSCOPY, left retograde pyelogram WITH left ureteral  STENT PLACEMENT;  Surgeon: Claybon Jabs, MD;  Location: [facility];  Service: Urology;  Laterality: Left;   CYSTOSCOPY/RETROGRADE/URETEROSCOPY  12/04/2011   Procedure: CYSTOSCOPY/RETROGRADE/URETEROSCOPY;  Surgeon: Molli Hazard, MD;  Location: [facility];  Service: Urology;  Laterality: Right;   CYSTOSCOPY/RETROGRADE/URETEROSCOPY/STONE EXTRACTION WITH BASKET Left 09/03/2012   Procedure: CYSTOSCOPY/RETROGRADE pyelogram/digital URETEROSCOPY/STONE EXTRACTION WITH BASKET, left stent removal;  Surgeon: Molli Hazard, MD;  Location: [facility];  Service: Urology;  Laterality: Left;   ESOPHAGOGASTRODUODENOSCOPY  2008   VOP:FYTWK hiatal hernia./Normal esophagus without evidence of Barrett's mass, stricture, erosion or ulceration./Normal duodenal bulb and second portion of the duodenum./Diffuse erythema in the body and the antrum with occasional erosion.  Biopsies obtained via cold forceps to evaluate for H. pylori gastritis   FRACTURE SURGERY  1999   right clavicle   HOLMIUM LASER APPLICATION Left 08/04/2861   Procedure: HOLMIUM LASER APPLICATION;  Surgeon: Molli Hazard, MD;  Location: [facility];  Service: Urology;  Laterality: Left;   KNEE ARTHROSCOPY  10/04/2010,    right knee arthroscopy, dr Theda Sers   LEFT HEART CATH AND CORONARY ANGIOGRAPHY N/A 05/08/2018   Procedure: LEFT HEART CATH AND CORONARY ANGIOGRAPHY;  Surgeon: Troy Sine, MD;  Location: [facility];  Service: Cardiovascular;  Laterality: N/A;   left knee arthroscopic surgery  15-Jul-1997   Left salphingectomy secondary to ectopic pregnancy  [place] CATH N/A 07/07/2019   Procedure: RIGHT HEART CATH;  Surgeon: Leonie Man, MD;  Location: [facility];  Service: Cardiovascular;  Laterality: N/A;   rotary cuff  Right  05/14/2014   [facility]   SPINE SURGERY N/A    Phreesia 06/02/2020   UPPER GASTROINTESTINAL ENDOSCOPY  Jul 16, 2006 abd pain   H. pylori gastritis   UPPER GASTROINTESTINAL ENDOSCOPY  1996 [place]   PUD    Social History   Socioeconomic History   Marital status: Single    Spouse name: Not on file   Number of children: 1   Years of education: 12   Highest education level: 12th grade  Occupational History   Occupation: Disable   Tobacco Use   Smoking status: Never   Smokeless tobacco: Never  Vaping Use   Vaping Use: Never used  Substance and Sexual Activity   Alcohol use: No   Drug use: No   Sexual activity: Not Currently    Partners: Male    Birth control/protection: Surgical    Comment: hyst  Other Topics Concern   Not on file  Social History Narrative   Lives alone    Social Determinants of Health   Financial Resource Strain: Low Risk    Difficulty of Paying Living Expenses: Not hard at all  Food Insecurity: No Food Insecurity   Worried About Charity fundraiser in the Last Year: Never true   Ran Out of Food in the Last Year: Never true  Transportation Needs: No Transportation Needs   Lack of Transportation (Medical): No   Lack of Transportation (Non-Medical): No  Physical Activity: Sufficiently Active   Days of Exercise per Week: 3 days   Minutes of Exercise per Session: 60 min  Stress: No Stress Concern Present   Feeling of Stress : Not at all  Social Connections: Socially Isolated   Frequency of Communication with Friends and Family: More than three times a week   Frequency of Social Gatherings with Friends and Family: More than three times a week   Attends Religious Services: Never   Marine scientist or Organizations: No   Attends Music therapist: Never   Marital Status: Divorced    Family History  Problem Relation Age of Onset   Heart disease Mother    Hypertension Mother    Cancer Mother        Cervical    Asthma Mother    Heart  attack Father    Cancer Father        Prostate   Colon cancer Father        DECEASED AGE 60[birth year]03-17   Colon polyps Neg Hx     Outpatient Encounter Medications as of 03/23/2021  Medication Sig   albuterol (VENTOLIN HFA) 108 (90 Base) MCG/ACT inhaler Inhale 2 puffs into the lungs every 6 (six) hours as needed for wheezing or shortness of breath.   amLODipine (NORVASC) 10 MG tablet TAKE 1 TABLET BY MOUTH EVERY DAY   betamethasone, augmented, (  DIPROLENE) 0.05 % lotion Apply 1 application topically 2 (two) times daily.   blood glucose meter kit and supplies Dispense based on patient and insurance preference. Once daily testing dx. e11.9   calcipotriene (DOVONOX) 0.005 % ointment Apply 1 application topically daily.    clobetasol cream (TEMOVATE) 2.58 % Apply 1 application topically 2 (two) times daily.   cloNIDine (CATAPRES - DOSED IN MG/24 HR) 0.3 mg/24hr patch APPLY 1 PATCH TO SKIN ONCE A WEEK   cloNIDine (CATAPRES) 0.3 MG tablet TAKE 1 TABLET (0.3 MG TOTAL) BY MOUTH AT BEDTIME.   cyclobenzaprine (FLEXERIL) 10 MG tablet TAKE 1 TABLET BY MOUTH THREE TIMES A DAY   diazepam (VALIUM) 5 MG tablet Take one tablet  by mouth three times daily for muscle spasm   [START ON 04/07/2021] diazepam (VALIUM) 5 MG tablet Take one tablet by mouth three times daily for spasm   DULoxetine (CYMBALTA) 60 MG capsule TAKE 1 CAPSULE BY MOUTH TWICE A DAY   ergocalciferol (VITAMIN D2) 1.25 MG (50000 UT) capsule Take 1 capsule (50,000 Units total) by mouth once a week. One capsule once weekly   esomeprazole (NEXIUM) 40 MG capsule TAKE 1 CAPSULE BY MOUTH EVERY DAY   fluticasone (FLONASE) 50 MCG/ACT nasal spray Place 2 sprays into both nostrils daily.   furosemide (LASIX) 40 MG tablet TAKE 1 TABLET BY MOUTH EVERY DAY   gabapentin (NEURONTIN) 800 MG tablet Take 1 tablet (800 mg total) by mouth 4 (four) times daily.   glipiZIDE (GLUCOTROL XL) 10 MG 24 hr tablet TAKE 1 TABLET BY MOUTH EVERY DAY WITH BREAKFAST   hydrOXYzine  (ATARAX/VISTARIL) 10 MG tablet Take one tablet by mouth art bedtime   ipratropium-albuterol (DUONEB) 0.5-2.5 (3) MG/3ML SOLN Take 3 mLs by nebulization every 6 (six) hours as needed.   KLOR-CON M20 20 MEQ tablet TAKE 1 TABLET BY MOUTH TWICE A DAY   Lancets (ONETOUCH DELICA PLUS [id number]) MISC TEST ONCE DAILY   levothyroxine (SYNTHROID) 75 MCG tablet TAKE 1 TABLET BY MOUTH EVERY DAY   loratadine (CLARITIN) 10 MG tablet TAKE 1 TABLET BY MOUTH EVERY DAY   metFORMIN (GLUCOPHAGE) 500 MG tablet TAKE 1 TABLET BY MOUTH 2 TIMES DAILY WITH A MEAL.   metoprolol tartrate (LOPRESSOR) 25 MG tablet Take 1 tablet (25 mg total) by mouth 2 (two) times daily.   naloxone (NARCAN) nasal spray 4 mg/0.1 mL One spray into each nostril once as needed   nitroGLYCERIN (NITROSTAT) 0.4 MG SL tablet Place 1 tablet (0.4 mg total) under the tongue every 5 (five) minutes as needed for chest pain.   ONETOUCH VERIO test strip USE AS INSTRUCTED FOUR TIMES DAILY TESTING DX E11.65   oxyCODONE (OXYCONTIN) 40 mg 12 hr tablet Take 1 tablet (40 mg total) by mouth every 12 (twelve) hours.   [START ON 04/09/2021] oxyCODONE (OXYCONTIN) 40 mg 12 hr tablet Take one tablet by mouth twice daily for chronic pain   [START ON 05/09/2021] oxyCODONE (OXYCONTIN) 40 mg 12 hr tablet Take one tablet by mouth two times daily for chronic pain   rosuvastatin (CRESTOR) 5 MG tablet Take one tablet by mouth every Monday, Wednesday and Friday   spironolactone (ALDACTONE) 25 MG tablet TAKE 1 TABLET (25 MG TOTAL) BY MOUTH DAILY.   SYMBICORT 160-4.5 MCG/ACT inhaler TAKE 2 PUFFS BY MOUTH TWICE A DAY   triamterene-hydrochlorothiazide (MAXZIDE) 75-50 MG tablet Take 1 tablet by mouth daily.   UNABLE TO FIND Sharps container 6Y or PRN   [DISCONTINUED] Insulin Lispro Prot &  Lispro (HUMALOG MIX 75/25 KWIKPEN) (75-25) 100 UNIT/ML Kwikpen Inject 65 Units into the skin 2 (two) times daily with a meal.   Insulin Lispro Prot & Lispro (HUMALOG MIX 75/25 KWIKPEN) (75-25) 100  UNIT/ML Kwikpen Inject 30 Units into the skin 2 (two) times daily with a meal.   zolpidem (AMBIEN) 10 MG tablet Take 1 tablet (10 mg total) by mouth at bedtime.   No facility-administered encounter medications on file as of 03/23/2021.    ALLERGIES: Allergies  Allergen Reactions   Latex Itching and Rash   Morphine Other (See Comments)    Reaction with esophagus, unable to swallow.    Morphine And Related Shortness Of Breath and Swelling   Other Itching, Shortness Of Breath and Swelling   Ace Inhibitors Cough   Aspirin Other (See Comments)    Reaction with esophagus, unable to swallow.    Losartan Cough   Tapentadol Other (See Comments)    Nausea, increased sleepiness, h/a   Tomato Other (See Comments)    Acid reflux due to acid in tomato   Adhesive [Tape] Rash    VACCINATION STATUS: Immunization History  Administered Date(s) Administered   Influenza Split 03/30/2011   Influenza Whole 03/21/2005, 02/09/2006   Influenza,inj,Quad PF,6+ Mos 02/11/2014, 07/27/2015, 03/09/2016, 01/29/2017, 02/07/2018, 01/30/2019, 02/11/2020, 03/03/2021   Influenza-Unspecified 01/29/2017   PFIZER(Purple Top)SARS-COV-2 Vaccination 07/22/2019, 08/12/2019, 03/05/2020, 11/19/2020   Pneumococcal Polysaccharide-23 04/17/2006, 09/14/2015   Td 04/17/2006    Diabetes She presents for her follow-up diabetic visit. She has type 2 diabetes mellitus. Her disease course has been stable. There are no hypoglycemic associated symptoms. Pertinent negatives for hypoglycemia include no confusion, headaches, pallor or seizures. Associated symptoms include fatigue and weight loss. Pertinent negatives for diabetes include no blurred vision, no chest pain, no polydipsia, no polyphagia and no polyuria. There are no hypoglycemic complications. Symptoms are improving. Diabetic complications include heart disease, nephropathy and peripheral neuropathy. Risk factors for coronary artery disease include dyslipidemia, diabetes  mellitus, family history, hypertension, obesity, sedentary lifestyle, tobacco exposure and post-menopausal. Current diabetic treatment includes insulin injections and oral agent (dual therapy). She is compliant with treatment some of the time (has only been taking the insulin once every few weeks). Her weight is decreasing steadily. She is following a generally healthy diet. When asked about meal planning, she reported none. She has had a previous visit with a dietitian. She never participates in exercise. Her home blood glucose trend is fluctuating dramatically. Her overall blood glucose range is 180-200 mg/dl. (She presents today with her meter and logs showing widely fluctuating glucose pattern.  She has had trouble sticking with eating routine and has not been injecting her insulin twice daily as instructed.  In fact, she has only been injecting insulin once every few days.  Her POCT A1c today is slightly worse than previous visit at 9.1%.  She denies any significant hypoglycemia.) An ACE inhibitor/angiotensin II receptor blocker is contraindicated. She does not see a podiatrist.Eye exam is not current.  Hyperlipidemia This is a chronic problem. The current episode started more than 1 year ago. The problem is controlled. Recent lipid tests were reviewed and are normal. Exacerbating diseases include chronic renal disease, diabetes, hypothyroidism and obesity. Factors aggravating her hyperlipidemia include beta blockers and thiazides. Pertinent negatives include no chest pain, myalgias or shortness of breath. Current antihyperlipidemic treatment includes statins. The current treatment provides mild improvement of lipids. Compliance problems include adherence to diet and adherence to exercise.  Risk factors for coronary artery disease include  diabetes mellitus, dyslipidemia, hypertension, obesity and a sedentary lifestyle.  Hypertension This is a chronic problem. The current episode started more than 1 year  ago. The problem has been gradually improving since onset. The problem is controlled. Associated symptoms include peripheral edema. Pertinent negatives include no blurred vision, chest pain, headaches, palpitations or shortness of breath. Agents associated with hypertension include thyroid hormones. Risk factors for coronary artery disease include family history, dyslipidemia, diabetes mellitus, obesity, sedentary lifestyle and post-menopausal state. Past treatments include calcium channel blockers, diuretics and beta blockers. The current treatment provides moderate improvement. Compliance problems include diet and exercise.  Hypertensive end-organ damage includes kidney disease, CAD/MI and heart failure. Identifiable causes of hypertension include chronic renal disease and sleep apnea.   Review of systems  Constitutional: + steadily decreasing body weight,  current Body mass index is 53.93 kg/m. , + fatigue, no subjective hyperthermia, no subjective hypothermia Eyes: no blurry vision, no xerophthalmia ENT: no sore throat, no nodules palpated in throat, no dysphagia/odynophagia, no hoarseness Cardiovascular: no chest pain, no shortness of breath, no palpitations, no leg swelling Respiratory: no cough, no SOB Gastrointestinal: no nausea/vomiting/diarrhea Musculoskeletal: no muscle/joint aches, walks with cane d/t hx of falls Skin: no rashes, no hyperemia Neurological: no tremors, no numbness, no tingling, no dizziness Psychiatric: no depression, no anxiety,    Objective:    BP 112/70   Pulse 81   Ht 5\' 4"  (1.626 m)   Wt (!) 314 lb 3.2 oz (142.5 kg)   BMI 53.93 kg/m   Wt Readings from Last 3 Encounters:  03/23/21 (!) 314 lb 3.2 oz (142.5 kg)  03/03/21 (!) 317 lb 0.6 oz (143.8 kg)  01/18/21 (!) 321 lb (145.6 kg)    BP Readings from Last 3 Encounters:  03/23/21 112/70  03/03/21 128/78  03/01/21 (!) 146/98     Physical Exam- Limited  Constitutional:  Body mass index is 53.93  kg/m. , not in acute distress, normal state of mind Eyes:  EOMI, no exophthalmos Neck: Supple Cardiovascular: RRR, no murmurs, rubs, or gallops, no edema Respiratory: Adequate breathing efforts, no crackles, rales, rhonchi, or wheezing Musculoskeletal: no gross deformities, strength intact in all four extremities, no gross restriction of joint movements, walks with cane due to disequilibrium and hx of falls Skin:  no rashes, no hyperemia Neurological: no tremor with outstretched hands   CMP ( most recent) CMP     Component Value Date/Time   NA 139 03/17/2021 0856   K 4.1 03/17/2021 0856   CL 100 03/17/2021 0856   CO2 23 03/17/2021 0856   GLUCOSE 176 (H) 03/17/2021 0856   GLUCOSE 316 (H) 02/11/2020 1111   BUN 12 03/17/2021 0856   CREATININE 0.81 03/17/2021 0856   CREATININE 0.91 02/11/2020 1111   CALCIUM 9.7 03/17/2021 0856   PROT 7.8 03/17/2021 0856   ALBUMIN 4.4 03/17/2021 0856   AST 26 03/17/2021 0856   ALT 27 03/17/2021 0856   ALKPHOS 91 03/17/2021 0856   BILITOT 0.3 03/17/2021 0856   GFRNONAA 70 05/06/2020 1201   GFRNONAA 69 02/11/2020 1111   GFRAA 80 05/06/2020 1201   GFRAA 80 02/11/2020 1111    Diabetic Labs (most recent): Lab Results  Component Value Date   HGBA1C 9.1 (A) 03/23/2021   HGBA1C 8.9 (A) 12/21/2020   HGBA1C 10.0 (A) 09/20/2020     Lipid Panel ( most recent) Lipid Panel     Component Value Date/Time   CHOL 140 11/24/2020 1453   TRIG 102 11/24/2020 1453  HDL 47 11/24/2020 1453   CHOLHDL 3.0 11/24/2020 1453   CHOLHDL 4.0 11/10/2019 1303   VLDL 16 05/06/2018 0335   LDLCALC 74 11/24/2020 1453   LDLCALC 97 11/10/2019 1303      Assessment & Plan:   1) Uncontrolled type 2 diabetes mellitus with hyperglycemia ([place])  - Angela [name] has currently uncontrolled symptomatic type 2 DM since  60 years of age.  She presents today with her meter and logs showing widely fluctuating glucose pattern.  She has had trouble sticking with eating routine  and has not been injecting her insulin twice daily as instructed.  In fact, she has only been injecting insulin once every few days.  Her POCT A1c today is slightly worse than previous visit at 9.1%.  She denies any significant hypoglycemia.   Recent labs reviewed.  -her diabetes is complicated by coronary artery disease, CHF, obesity/sedentary life and she remains at a high risk for more acute and chronic complications which include CAD, CVA, CKD, retinopathy, and neuropathy. These are all discussed in detail with her.  - Nutritional counseling repeated at each appointment due to patients tendency to fall back in to old habits.  - The patient admits there is a room for improvement in their diet and drink choices. -  Suggestion is made for the patient to avoid simple carbohydrates from their diet including Cakes, Sweet Desserts / Pastries, Ice Cream, Soda (diet and regular), Sweet Tea, Candies, Chips, Cookies, Sweet Pastries, Store Bought Juices, Alcohol in Excess of 1-2 drinks a day, Artificial Sweeteners, Coffee Creamer, and "Sugar-free" Products. This will help patient to have stable blood glucose profile and potentially avoid unintended weight gain.   - I encouraged the patient to switch to unprocessed or minimally processed complex starch and increased protein intake (animal or plant source), fruits, and vegetables.   - Patient is advised to stick to a routine mealtimes to eat 3 meals a day and avoid unnecessary snacks (to snack only to correct hypoglycemia).  - I have approached her with the following individualized plan to manage diabetes and patient agrees:   -Given her presentation with chronic glycemic burden, she will likely require multiple daily injections of insulin in order for her to achieve control of diabetes to target.  -She is advised to start her Humalog 75/25 30 units SQ twice daily with breakfast and supper.  She can continue her Metformin 500 mg po twice daily and Glipizide  10 mg XL daily with breakfast.    -She is encouraged to consistently monitor glucose 3 times daily, before injecting insulin and before bed, and to call the clinic if she has readings less than 70 or greater than 300 for 3 tests in a row.  - she is warned not to take insulin without proper monitoring per orders.  2) Blood Pressure /Hypertension: Her blood pressure is controlled to target.  She is advised to continue Norvasc 10 mg po daily, Clonidine 0.3 mg patch once weekly, Clonidine 0.3 mg po daily at bedtime, Lasix 40 mg po daily, Spironolactone 25 mg po daily, and Metoprolol 25 mg po twice daily.  3) Lipids/Hyperlipidemia:  Her most recent lipid panel from 11/24/20 shows controlled LDL at 74 (greatly improved).  She is advised to continue Crestor 5 mg po every other day at bedtime.    4)  Weight/Diet:  Her Body mass index is 53.93 kg/m. - clearly complicating her diabetes care.  She is a candidate for modest weight loss.  She has multiple  comorbid situations complicating her chance of bariatric surgery.  I discussed with her the fact that loss of 5 - 10% of her  current body weight will have the most impact on her diabetes management.  CDE Consult will be initiated . Exercise, and detailed carbohydrates information provided  -  detailed on discharge instructions.  5) Hypothyroidism:   -Her previsit thyroid function tests are consistent with appropriate hormone replacement.  She is advised to continue Levothyroxine 75 mcg po daily before breakfast.    - We discussed about the correct intake of her thyroid hormone, on empty stomach at fasting, with water, separated by at least 30 minutes from breakfast and other medications,  and separated by more than 4 hours from calcium, iron, multivitamins, acid reflux medications (PPIs). -Patient is made aware of the fact that thyroid hormone replacement is needed for life, dose to be adjusted by periodic monitoring of thyroid function tests.  6) Chronic  Care/Health Maintenance: -she is not on ACE/ARB (intolerant to both) is on statin medications and is encouraged to initiate and continue to follow up with Ophthalmology, Dentist,  Podiatrist at least yearly or according to recommendations, and advised to  stay away from smoking. I have recommended yearly flu vaccine and pneumonia vaccine at least every 5 years; moderate intensity exercise for up to 150 minutes weekly; and  sleep for at least 7 hours a day.  - she is advised to maintain close follow up with Angela Helper, MD for primary care needs, as well as her other providers for optimal and coordinated care.     I spent 40 minutes in the care of the patient today including review of labs from [facility], Lipids, Thyroid Function, Hematology (current and previous including abstractions from other facilities); face-to-face time discussing  her blood glucose readings/logs, discussing hypoglycemia and hyperglycemia episodes and symptoms, medications doses, her options of short and long term treatment based on the latest standards of care / guidelines;  discussion about incorporating lifestyle medicine;  and documenting the encounter.    Please refer to Patient Instructions for Blood Glucose Monitoring and Insulin/Medications Dosing Guide"  in media tab for additional information. Please  also refer to " Patient Self Inventory" in the Media  tab for reviewed elements of pertinent patient history.  Alma participated in the discussions, expressed understanding, and voiced agreement with the above plans.  All questions were answered to her satisfaction. she is encouraged to contact clinic should she have any questions or concerns prior to her return visit.  Follow up plan: - Return in about 3 months (around 06/23/2021) for Diabetes F/U with A1c in office, No previsit labs, Bring meter and logs.    Rayetta Pigg, [facility] [address] [place],  [place] [address] Phone: [phone] Fax: [phone]   03/23/2021, 2:09 PM

## 2021-03-26 ENCOUNTER — Other Ambulatory Visit: Payer: Self-pay | Admitting: Nurse Practitioner

## 2021-03-26 NOTE — Progress Notes (Signed)
reschedule

## 2021-03-28 ENCOUNTER — Other Ambulatory Visit: Payer: Self-pay | Admitting: Family Medicine

## 2021-03-29 ENCOUNTER — Ambulatory Visit (INDEPENDENT_AMBULATORY_CARE_PROVIDER_SITE_OTHER): Payer: Medicare Other | Admitting: Internal Medicine

## 2021-03-29 ENCOUNTER — Encounter: Payer: Self-pay | Admitting: Internal Medicine

## 2021-03-29 ENCOUNTER — Other Ambulatory Visit: Payer: Self-pay

## 2021-03-29 VITALS — BP 136/80 | HR 72 | Ht 64.0 in | Wt 312.0 lb

## 2021-03-29 DIAGNOSIS — R0609 Other forms of dyspnea: Secondary | ICD-10-CM | POA: Diagnosis not present

## 2021-03-29 DIAGNOSIS — E1159 Type 2 diabetes mellitus with other circulatory complications: Secondary | ICD-10-CM | POA: Diagnosis not present

## 2021-03-29 DIAGNOSIS — I5032 Chronic diastolic (congestive) heart failure: Secondary | ICD-10-CM | POA: Diagnosis not present

## 2021-03-29 DIAGNOSIS — E7800 Pure hypercholesterolemia, unspecified: Secondary | ICD-10-CM | POA: Diagnosis not present

## 2021-03-29 DIAGNOSIS — I10 Essential (primary) hypertension: Secondary | ICD-10-CM | POA: Diagnosis not present

## 2021-03-29 NOTE — Patient Instructions (Signed)
Medication Instructions:  ?No Changes In Medications at this time.  ?*If you need a refill on your cardiac medications before your next appointment, please call your pharmacy* ? ?Follow-Up: ?At [facility], you and your health needs are our priority.  As part of our continuing mission to provide you with exceptional heart care, we have created designated Provider Care Teams.  These Care Teams include your primary Cardiologist (physician) and Advanced Practice Providers (APPs -  Physician Assistants and Nurse Practitioners) who all work together to provide you with the care you need, when you need it. ? ?Your next appointment:   ?6 month(s) ? ?The format for your next appointment:   ?In Person ? ?Provider:   ?Gayatri A Acharya, MD   ? ?  ?

## 2021-03-29 NOTE — Progress Notes (Signed)
Cardiology Office Note:    Date:  10/15/2020   ID:  Angela [name], DOB 1960-11-20, MRN [id number]  PCP:  Fayrene Helper, MD  Cardiologist:  Elouise Munroe, MD  Electrophysiologist:  None   Referring MD: Fayrene Helper, MD   Chief Complaint/Reason for Referral: Follow up DOE, LE edema  History of Present Illness:    Angela [name] is a 60 y.o. female with a history of HTN, HLD, and diabetes mellitus. Presents for follow up 6 mo from our last visit.   She is feeling really well overall and Varney Biles joins her for the visit, her daughter. She provides additional history.   Not requiring oxygen today. Trying to exercise and eat healthy. I have commended her. She looks to be in excellent spirits today.   Mild LE edema well managed with lasix. Leg cramps improved after she was prescribed valium. No longer requiring her to drink gatorade during the day.   The patient denies chest pain, chest pressure, dyspnea at rest or with exertion, palpitations, PND, orthopnea. Denies cough, fever, chills. Denies nausea, vomiting. Denies syncope or presyncope. Denies dizziness or lightheadedness. Denies snoring.   Past Medical History:  Diagnosis Date   Allergy    Phreesia 06/02/2020   Anxiety    Arthritis    Asthma    Asthma    Phreesia 04/12/2020   Breast mass    left (monitor)   CHF (congestive heart failure) ([place])    Phreesia 04/12/2020   Coronary atherosclerosis of native coronary artery    Mild nonobstructive CAD by cardiac catheterization 2008 - Dr. Terrence Dupont   Degenerative disc disease    Depression    Depression    Phreesia 04/12/2020   Diabetes mellitus without complication ([place])    Phreesia 04/12/2020   Dysrhythmia    Essential hypertension, benign    Folliculitis 10/62/6948   GERD (gastroesophageal reflux disease)    H/O hiatal hernia    Helicobacter pylori gastritis 2008   Hypercholesterolemia    Hypertension    Hyperthyroidism    s/p radiation   Left-sided  epistaxis 05/15/2018   Low back pain    Migraine    Nephrolithiasis    Recurring episodes since 2004   Neuromuscular disorder ([place])    Phreesia 04/12/2020   Severe obesity (BMI >= 40) ([facility])    Sleep apnea    STOP BANG SCORE 6no cpap used   Thyroid disease    Phreesia 04/12/2020   Type 2 diabetes mellitus with diabetic neuropathy ([place])     Past Surgical History:  Procedure Laterality Date   [place]  2000 [facility]   INT HEMORRHOIDS/FISSURE   COLONOSCOPY  2003 [facility], CHANGE IN BOWEL HABITS   INT HEMORRHOIDS   COLONOSCOPY  2006 [facility]   INT HEMORRHOIDS   COLONOSCOPY  2007 [facility]   INT HEMORRHOIDS   CYSTOSCOPY WITH STENT PLACEMENT Left 07/25/2012   Procedure: CYSTOSCOPY, left retograde pyelogram WITH left ureteral  STENT PLACEMENT;  Surgeon: Claybon Jabs, MD;  Location: [facility];  Service: Urology;  Laterality: Left;   CYSTOSCOPY/RETROGRADE/URETEROSCOPY  12/04/2011   Procedure: CYSTOSCOPY/RETROGRADE/URETEROSCOPY;  Surgeon: Molli Hazard, MD;  Location: [facility];  Service: Urology;  Laterality: Right;   CYSTOSCOPY/RETROGRADE/URETEROSCOPY/STONE EXTRACTION WITH  BASKET Left 09/03/2012   Procedure: CYSTOSCOPY/RETROGRADE pyelogram/digital URETEROSCOPY/STONE EXTRACTION WITH BASKET, left stent removal;  Surgeon: Molli Hazard, MD;  Location: [facility];  Service: Urology;  Laterality: Left;   ESOPHAGOGASTRODUODENOSCOPY  2008   WIO:XBDZH hiatal hernia./Normal esophagus without evidence of Barrett's mass, stricture, erosion or ulceration./Normal duodenal bulb and second portion of the duodenum./Diffuse erythema in the body and the antrum with occasional erosion.  Biopsies obtained via cold forceps to evaluate for H. pylori gastritis   FRACTURE SURGERY  1999   right clavicle   HOLMIUM LASER APPLICATION Left  06/09/9240   Procedure: HOLMIUM LASER APPLICATION;  Surgeon: Molli Hazard, MD;  Location: [facility];  Service: Urology;  Laterality: Left;   KNEE ARTHROSCOPY  10/04/2010,    right knee arthroscopy, dr Theda Sers   LEFT HEART CATH AND CORONARY ANGIOGRAPHY N/A 05/08/2018   Procedure: LEFT HEART CATH AND CORONARY ANGIOGRAPHY;  Surgeon: Troy Sine, MD;  Location: [place] [facility];  Service: Cardiovascular;  Laterality: N/A;   left knee arthroscopic surgery  1999   Left salphingectomy secondary to ectopic pregnancy  [place] CATH N/A 07/07/2019   Procedure: RIGHT HEART CATH;  Surgeon: Leonie Man, MD;  Location: [place] [facility];  Service: Cardiovascular;  Laterality: N/A;   rotary cuff  Right 05/14/2014   [facility]   SPINE SURGERY N/A    Phreesia 06/02/2020   UPPER GASTROINTESTINAL ENDOSCOPY  2008 abd pain   H. pylori gastritis   UPPER GASTROINTESTINAL ENDOSCOPY  1996 [place]   PUD    Current Medications: Current Meds  Medication Sig   albuterol (VENTOLIN HFA) 108 (90 Base) MCG/ACT inhaler Inhale 2 puffs into the lungs every 6 (six) hours as needed for wheezing or shortness of breath.   amLODipine (NORVASC) 10 MG tablet TAKE 1 TABLET BY MOUTH EVERY DAY   betamethasone, augmented, (DIPROLENE) 0.05 % lotion Apply 1 application topically 2 (two) times daily.   blood glucose meter kit and supplies Dispense based on patient and insurance preference. Once daily testing dx. e11.9   calcipotriene (DOVONOX) 0.005 % ointment Apply 1 application topically daily.    clobetasol cream (TEMOVATE) 6.83 % Apply 1 application topically 2 (two) times daily.   cloNIDine (CATAPRES - DOSED IN MG/24 HR) 0.3 mg/24hr patch APPLY 1 PATCH TO SKIN ONCE A WEEK   cloNIDine (CATAPRES) 0.3 MG tablet TAKE 1 TABLET (0.3 MG TOTAL) BY MOUTH AT BEDTIME.   cyclobenzaprine (FLEXERIL) 10 MG tablet TAKE 1 TABLET BY MOUTH THREE TIMES A DAY   diazepam (VALIUM) 5 MG tablet Take  one tablet  by mouth three times daily for muscle spasm   [START ON 04/07/2021] diazepam (VALIUM) 5 MG tablet Take one tablet by mouth three times daily for spasm   DULoxetine (CYMBALTA) 60 MG capsule TAKE 1 CAPSULE BY MOUTH TWICE A DAY   ergocalciferol (VITAMIN D2) 1.25 MG (50000 UT) capsule Take 1 capsule (50,000 Units total) by mouth once a week. One capsule once weekly   esomeprazole (NEXIUM) 40 MG capsule TAKE 1 CAPSULE BY MOUTH EVERY DAY   fluticasone (FLONASE) 50 MCG/ACT nasal spray Place 2 sprays into both nostrils daily.   furosemide (LASIX) 40 MG tablet TAKE 1 TABLET BY MOUTH EVERY DAY   gabapentin (NEURONTIN) 800 MG tablet Take 1 tablet (800 mg total) by mouth 4 (four) times daily.   glipiZIDE (GLUCOTROL XL) 10 MG 24 hr tablet TAKE 1 TABLET BY MOUTH  EVERY DAY WITH BREAKFAST   hydrOXYzine (ATARAX/VISTARIL) 10 MG tablet Take one tablet by mouth art bedtime   Insulin Lispro Prot & Lispro (HUMALOG MIX 75/25 KWIKPEN) (75-25) 100 UNIT/ML Kwikpen Inject 30 Units into the skin 2 (two) times daily with a meal.   ipratropium-albuterol (DUONEB) 0.5-2.5 (3) MG/3ML SOLN Take 3 mLs by nebulization every 6 (six) hours as needed.   KLOR-CON M20 20 MEQ tablet TAKE 1 TABLET BY MOUTH TWICE A DAY   Lancets (ONETOUCH DELICA PLUS XLKGMW10U) MISC TEST ONCE DAILY   levothyroxine (SYNTHROID) 75 MCG tablet TAKE 1 TABLET BY MOUTH EVERY DAY   loratadine (CLARITIN) 10 MG tablet TAKE 1 TABLET BY MOUTH EVERY DAY   metFORMIN (GLUCOPHAGE) 500 MG tablet TAKE 1 TABLET BY MOUTH 2 TIMES DAILY WITH A MEAL.   metoprolol tartrate (LOPRESSOR) 25 MG tablet Take 1 tablet (25 mg total) by mouth 2 (two) times daily.   naloxone (NARCAN) nasal spray 4 mg/0.1 mL One spray into each nostril once as needed   nitroGLYCERIN (NITROSTAT) 0.4 MG SL tablet Place 1 tablet (0.4 mg total) under the tongue every 5 (five) minutes as needed for chest pain.   ONETOUCH VERIO test strip USE AS INSTRUCTED FOUR TIMES DAILY TESTING DX E11.65    oxyCODONE (OXYCONTIN) 40 mg 12 hr tablet Take 1 tablet (40 mg total) by mouth every 12 (twelve) hours.   [START ON 04/09/2021] oxyCODONE (OXYCONTIN) 40 mg 12 hr tablet Take one tablet by mouth twice daily for chronic pain   [START ON 05/09/2021] oxyCODONE (OXYCONTIN) 40 mg 12 hr tablet Take one tablet by mouth two times daily for chronic pain   rosuvastatin (CRESTOR) 5 MG tablet Take one tablet by mouth every Monday, Wednesday and Friday   spironolactone (ALDACTONE) 25 MG tablet TAKE 1 TABLET (25 MG TOTAL) BY MOUTH DAILY.   SYMBICORT 160-4.5 MCG/ACT inhaler TAKE 2 PUFFS BY MOUTH TWICE A DAY   triamterene-hydrochlorothiazide (MAXZIDE) 75-50 MG tablet Take 1 tablet by mouth daily.   UNABLE TO FIND Sharps container 6Y or PRN     Allergies:   Latex, Morphine, Morphine and related, Other, Ace inhibitors, Aspirin, Losartan, Tapentadol, Tomato, and Adhesive [tape]   Social History   Tobacco Use   Smoking status: Never   Smokeless tobacco: Never  Vaping Use   Vaping Use: Never used  Substance Use Topics   Alcohol use: No   Drug use: No     Family History: The patient's family history includes Asthma in her mother; Cancer in her father and mother; Colon cancer in her father; Heart attack in her father; Heart disease in her mother; Hypertension in her mother. There is no history of Colon polyps.  ROS:   Please see the history of present illness.    (+) Stress (+) Shortness of breath All other systems reviewed and are negative.  EKGs/Labs/Other Studies Reviewed:    The following studies were reviewed today:  Echo 07/09/2019: 1. Left ventricular ejection fraction, by estimation, is 60 to 65%. The  left ventricle has normal function. The left ventricle has no regional  wall motion abnormalities. There is mild concentric left ventricular  hypertrophy. Left ventricular diastolic  parameters are consistent with Grade I diastolic dysfunction (impaired  relaxation).   2. Right ventricular  systolic function is normal. The right ventricular  size is normal.   3. The mitral valve is normal in structure. Trivial mitral valve  regurgitation.   4. The aortic valve is tricuspid. Aortic valve regurgitation is  not  visualized. No aortic stenosis is present.   [place] 07/07/2019: No evidence of pulmonary hypertension Relatively normal right heart Pressures with moderately elevated PCWP and right atrial pressures. ->  Numbers do not suggest pulmonary hypertension Hyperdynamic cardiac output however with slightly reduced cardiac index (likely related to obesity).   Recommendations: Suspect noncardiac etiology for dyspnea although diastolic dysfunction component is also possible. We will restart home dose diuretic. Continue CPAP Weight loss  LHC 05/08/2018: Prox LAD to Mid LAD lesion is 10% stenosed. Mid LAD lesion is 20% stenosed.   No significant coronary obstructive disease with mild 10 to 40% mid systolic bridging of the LAD; normal ramus intermediate, left circumflex, and dominant RCA.   LVEDP 14 mm.  Previous echo Doppler study EF 60 to 65%.   RECOMMENDATION: Medical therapy.  Suspect nonischemic etiology to the patient's chest pain.  Recommend weight loss, optimal blood pressure control with target blood pressure less than 130/80.  Lipid-lowering therapy with target LDL less than 70 in this diabetic female.  TTE 05/07/2018: - Left ventricle: The cavity size was normal. Systolic function was    normal. The estimated ejection fraction was in the range of 60%    to 65%. Wall motion was normal; there were no regional wall    motion abnormalities. Features are consistent with a pseudonormal    left ventricular filling pattern, with concomitant abnormal    relaxation and increased filling pressure (grade 2 diastolic    dysfunction). Doppler parameters are consistent with high    ventricular filling pressure.  - Aortic valve: Valve area (VTI): 3.19 cm^2. Valve area (Vmax):    2.84  cm^2. Valve area (Vmean): 3.03 cm^2.  - Mitral valve: There was mild regurgitation.  - Left atrium: The atrium was moderately dilated.  EKG:  03/29/21: NSR, septal infarct pattern. 10/15/2020: NSR, rate 90 bpm, septal infarct pattern 04/12/2020: NSR, left axis deviation, poor R wave progression  Recent Labs: 05/06/2020: Hemoglobin 13.2; Platelets 495 03/17/2021: ALT 27; BUN 12; Creatinine, Ser 0.81; Potassium 4.1; Sodium 139; TSH 3.120  Recent Lipid Panel    Component Value Date/Time   CHOL 140 11/24/2020 1453   TRIG 102 11/24/2020 1453   HDL 47 11/24/2020 1453   CHOLHDL 3.0 11/24/2020 1453   CHOLHDL 4.0 11/10/2019 1303   VLDL 16 05/06/2018 0335   LDLCALC 74 11/24/2020 1453   LDLCALC 97 11/10/2019 1303    Physical Exam:    BP 136/80   Pulse 72   Ht 5' 4" (1.626 m)   Wt (!) 312 lb (141.5 kg)   SpO2 97%   BMI 53.55 kg/m     Wt Readings from Last 5 Encounters:  03/29/21 (!) 312 lb (141.5 kg)  03/23/21 (!) 314 lb 3.2 oz (142.5 kg)  03/03/21 (!) 317 lb 0.6 oz (143.8 kg)  01/18/21 (!) 321 lb (145.6 kg)  01/07/21 (!) 322 lb 3.2 oz (146.1 kg)    Constitutional: No acute distress Eyes: sclera non-icteric, normal conjunctiva and lids ENMT: normal dentition, moist mucous membranes Cardiovascular: regular rhythm, normal rate, no murmur. S1 and S2 normal. No jugular venous distention.  Respiratory: clear to auscultation bilaterally GI : normal bowel sounds, soft and nontender. No distention.   MSK: extremities warm, well perfused. Trace bilateral edema.  NEURO: grossly nonfocal exam, moves all extremities. PSYCH: alert and oriented x 3, normal mood and affect.     ASSESSMENT:    1. Essential hypertension   2. Chronic diastolic heart failure ([place])  3. DOE (dyspnea on exertion)   4. Hypercholesterolemia   5. Type 2 diabetes mellitus with vascular disease ([place])   6. Morbid obesity ([place])     PLAN:    Chronic diastolic HF DOE  - improved with increased activity.  Encouraged her to continue her positive steps. Not requiring oxygen today.  - continue daily lasix 40 mg. Cramps improved with valium, per PCP. On Kcl supp.  HLD - she is on crestor 5 mg MWF, continue.   HTN - BP mildly elevated but stable. Goal BP is <120/80 with diabetes and obesity. Continue amlodipine 10 mg daily, clonidine 0.3 mg QHS, lasix 40 mg daily, metoprolol tartrate 25 mg BID, spironolactone 25 mg daily, and maxide 75-50 mg daily.   Total time of encounter: 30 minutes total time of encounter, including 20 minutes spent in face-to-face patient care on the date of this encounter. This time includes coordination of care and counseling regarding above mentioned problem list. Remainder of non-face-to-face time involved reviewing chart documents/testing relevant to the patient encounter and documentation in the medical record. I have independently reviewed documentation from referring provider.   Cherlynn Kaiser, MD, [facility]     Medication Adjustments/Labs and Tests Ordered: Current medicines are reviewed at length with the patient today.  Concerns regarding medicines are outlined above.   No orders of the defined types were placed in this encounter.   Patient Instructions  Medication Instructions:  No Changes In Medications at this time.  *If you need a refill on your cardiac medications before your next appointment, please call your pharmacy*  Follow-Up: At [facility], you and your health needs are our priority.  As part of our continuing mission to provide you with exceptional heart care, we have created designated Provider Care Teams.  These Care Teams include your primary Cardiologist (physician) and Advanced Practice Providers (APPs -  Physician Assistants and Nurse Practitioners) who all work together to provide you with the care you need, when you need it.  Your next appointment:   6 month(s)  The format for your next appointment:   In  Person  Provider:   Elouise Munroe, MD

## 2021-04-05 DIAGNOSIS — M5416 Radiculopathy, lumbar region: Secondary | ICD-10-CM | POA: Diagnosis not present

## 2021-04-05 DIAGNOSIS — R0609 Other forms of dyspnea: Secondary | ICD-10-CM | POA: Diagnosis not present

## 2021-04-05 DIAGNOSIS — E119 Type 2 diabetes mellitus without complications: Secondary | ICD-10-CM | POA: Diagnosis not present

## 2021-04-05 DIAGNOSIS — I10 Essential (primary) hypertension: Secondary | ICD-10-CM | POA: Diagnosis not present

## 2021-04-05 DIAGNOSIS — I2510 Atherosclerotic heart disease of native coronary artery without angina pectoris: Secondary | ICD-10-CM | POA: Diagnosis not present

## 2021-04-05 NOTE — Progress Notes (Signed)
error 

## 2021-04-08 ENCOUNTER — Ambulatory Visit: Payer: Medicare Other | Admitting: Internal Medicine

## 2021-04-14 ENCOUNTER — Ambulatory Visit (INDEPENDENT_AMBULATORY_CARE_PROVIDER_SITE_OTHER): Payer: Medicare Other | Admitting: Licensed Clinical Social Worker

## 2021-04-14 ENCOUNTER — Other Ambulatory Visit: Payer: Self-pay

## 2021-04-14 DIAGNOSIS — F322 Major depressive disorder, single episode, severe without psychotic features: Secondary | ICD-10-CM

## 2021-04-18 ENCOUNTER — Ambulatory Visit (INDEPENDENT_AMBULATORY_CARE_PROVIDER_SITE_OTHER): Payer: Medicare Other

## 2021-04-18 ENCOUNTER — Other Ambulatory Visit: Payer: Self-pay | Admitting: Pulmonary Disease

## 2021-04-18 ENCOUNTER — Other Ambulatory Visit: Payer: Self-pay

## 2021-04-18 VITALS — BP 178/98 | Ht 64.0 in | Wt 310.2 lb

## 2021-04-18 DIAGNOSIS — Z0000 Encounter for general adult medical examination without abnormal findings: Secondary | ICD-10-CM

## 2021-04-18 NOTE — Progress Notes (Signed)
I connected with  Angela [name] on 04/18/21 by a audio enabled telemedicine application and verified that I am speaking with the correct person using two identifiers.  Patient Location: Home  Provider Location: Office/Clinic  I discussed the limitations of evaluation and management by telemedicine. The patient expressed understanding and agreed to proceed.  Subjective:   Angela [name] is a 60 y.o. female who presents for Medicare Annual (Subsequent) preventive examination.  Review of Systems     Cardiac Risk Factors include: advanced age (>84mn, >>49women);diabetes mellitus;dyslipidemia;obesity (BMI >30kg/m2)     Objective:    Today's Vitals   04/18/21 0845  BP: (!) 178/98  Weight: (!) 310 lb 3.2 oz (140.7 kg)  Height: _0  (1.626 m)  PainSc: 8    Body mass index is 53.25 kg/m.  Advanced Directives 04/18/2021 03/01/2021 04/15/2020 07/07/2019 05/06/2018 05/05/2018 04/10/2018  Does Patient Have a Medical Advance Directive? No No No Yes No Yes No  Type of Advance Directive - - -Public librarianLiving will - HWaldo-  Does patient want to make changes to medical advance directive? - - - No - Patient declined - - -  Copy of [place]? - - - No - copy requested - No - copy requested -  Would patient like information on creating a medical advance directive? Yes (ED - Information included in AVS) No - Patient declined No - Patient declined - No - Patient declined No - Patient declined No - Patient declined  Pre-existing out of facility DNR order (yellow form or pink MOST form) - - - - - - -    Current Medications (verified) Outpatient Encounter Medications as of 04/18/2021  Medication Sig   albuterol (VENTOLIN HFA) 108 (90 Base) MCG/ACT inhaler Inhale 2 puffs into the lungs every 6 (six) hours as needed for wheezing or shortness of breath.   amLODipine (NORVASC) 10 MG tablet TAKE 1 TABLET BY MOUTH EVERY DAY    betamethasone, augmented, (DIPROLENE) 0.05 % lotion Apply 1 application topically 2 (two) times daily.   blood glucose meter kit and supplies Dispense based on patient and insurance preference. Once daily testing dx. e11.9   calcipotriene (DOVONOX) 0.005 % ointment Apply 1 application topically daily.    clobetasol cream (TEMOVATE) 00.21% Apply 1 application topically 2 (two) times daily.   cloNIDine (CATAPRES - DOSED IN MG/24 HR) 0.3 mg/24hr patch APPLY 1 PATCH TO SKIN ONCE A WEEK   cloNIDine (CATAPRES) 0.3 MG tablet TAKE 1 TABLET (0.3 MG TOTAL) BY MOUTH AT BEDTIME.   cyclobenzaprine (FLEXERIL) 10 MG tablet TAKE 1 TABLET BY MOUTH THREE TIMES A DAY   diazepam (VALIUM) 5 MG tablet Take one tablet  by mouth three times daily for muscle spasm   diazepam (VALIUM) 5 MG tablet Take one tablet by mouth three times daily for spasm   DULoxetine (CYMBALTA) 60 MG capsule TAKE 1 CAPSULE BY MOUTH TWICE A DAY   ergocalciferol (VITAMIN D2) 1.25 MG (50000 UT) capsule Take 1 capsule (50,000 Units total) by mouth once a week. One capsule once weekly   esomeprazole (NEXIUM) 40 MG capsule TAKE 1 CAPSULE BY MOUTH EVERY DAY   fluticasone (FLONASE) 50 MCG/ACT nasal spray Place 2 sprays into both nostrils daily.   furosemide (LASIX) 40 MG tablet TAKE 1 TABLET BY MOUTH EVERY DAY   gabapentin (NEURONTIN) 800 MG tablet Take 1 tablet (800 mg total) by mouth 4 (four) times daily.  glipiZIDE (GLUCOTROL XL) 10 MG 24 hr tablet TAKE 1 TABLET BY MOUTH EVERY DAY WITH BREAKFAST   hydrOXYzine (ATARAX/VISTARIL) 10 MG tablet Take one tablet by mouth art bedtime   Insulin Lispro Prot & Lispro (HUMALOG MIX 75/25 KWIKPEN) (75-25) 100 UNIT/ML Kwikpen Inject 30 Units into the skin 2 (two) times daily with a meal.   ipratropium-albuterol (DUONEB) 0.5-2.5 (3) MG/3ML SOLN Take 3 mLs by nebulization every 6 (six) hours as needed.   KLOR-CON M20 20 MEQ tablet TAKE 1 TABLET BY MOUTH TWICE A DAY   Lancets (ONETOUCH DELICA PLUS VCBSWH67R) MISC  TEST ONCE DAILY   levothyroxine (SYNTHROID) 75 MCG tablet TAKE 1 TABLET BY MOUTH EVERY DAY   loratadine (CLARITIN) 10 MG tablet TAKE 1 TABLET BY MOUTH EVERY DAY   metFORMIN (GLUCOPHAGE) 500 MG tablet TAKE 1 TABLET BY MOUTH 2 TIMES DAILY WITH A MEAL.   metoprolol tartrate (LOPRESSOR) 25 MG tablet Take 1 tablet (25 mg total) by mouth 2 (two) times daily.   naloxone (NARCAN) nasal spray 4 mg/0.1 mL One spray into each nostril once as needed   nitroGLYCERIN (NITROSTAT) 0.4 MG SL tablet Place 1 tablet (0.4 mg total) under the tongue every 5 (five) minutes as needed for chest pain.   ONETOUCH VERIO test strip USE AS INSTRUCTED FOUR TIMES DAILY TESTING DX E11.65   oxyCODONE (OXYCONTIN) 40 mg 12 hr tablet Take one tablet by mouth twice daily for chronic pain   [START ON 05/09/2021] oxyCODONE (OXYCONTIN) 40 mg 12 hr tablet Take one tablet by mouth two times daily for chronic pain   rosuvastatin (CRESTOR) 5 MG tablet Take one tablet by mouth every Monday, Wednesday and Friday   spironolactone (ALDACTONE) 25 MG tablet TAKE 1 TABLET (25 MG TOTAL) BY MOUTH DAILY.   SYMBICORT 160-4.5 MCG/ACT inhaler TAKE 2 PUFFS BY MOUTH TWICE A DAY   triamterene-hydrochlorothiazide (MAXZIDE) 75-50 MG tablet Take 1 tablet by mouth daily.   UNABLE TO FIND Sharps container 6Y or PRN   zolpidem (AMBIEN) 10 MG tablet Take 1 tablet (10 mg total) by mouth at bedtime.   No facility-administered encounter medications on file as of 04/18/2021.    Allergies (verified) Latex, Morphine, Morphine and related, Other, Ace inhibitors, Aspirin, Losartan, Tapentadol, Tomato, and Adhesive [tape]   History: Past Medical History:  Diagnosis Date   Allergy    Phreesia 06/02/2020   Anxiety    Arthritis    Asthma    Asthma    Phreesia 04/12/2020   Breast mass    left (monitor)   CHF (congestive heart failure) (Angela [name])    Phreesia 04/12/2020   Coronary atherosclerosis of native coronary artery    Mild nonobstructive CAD by cardiac  catheterization 2008 - Dr. Terrence [name]   Degenerative disc disease    Depression    Depression    Phreesia 04/12/2020   Diabetes mellitus without complication ([place])    Phreesia 04/12/2020   Dysrhythmia    Essential hypertension, benign    Folliculitis 91/63/8466   GERD (gastroesophageal reflux disease)    H/O hiatal hernia    Helicobacter pylori gastritis 2008   Hypercholesterolemia    Hypertension    Hyperthyroidism    s/p radiation   Left-sided epistaxis 05/15/2018   Low back pain    Migraine    Nephrolithiasis    Recurring episodes since 2004   Neuromuscular disorder ([place])    Phreesia 04/12/2020   Severe obesity (BMI >= 40) ([facility])    Sleep apnea    STOP  BANG SCORE 6no cpap used   Thyroid disease    Phreesia 04/12/2020   Type 2 diabetes mellitus with diabetic neuropathy [facility])    Past Surgical History:  Procedure Laterality Date   [place]  2002   BACK SURGERY  2011   [facility]    CARPAL TUNNEL RELEASE Left    CHOLECYSTECTOMY  1996   COLONOSCOPY  2000 [facility]   INT HEMORRHOIDS/FISSURE   COLONOSCOPY  2003 [facility], CHANGE IN BOWEL HABITS   INT HEMORRHOIDS   COLONOSCOPY  2006 [facility]   INT HEMORRHOIDS   COLONOSCOPY  2007 [facility]   INT HEMORRHOIDS   CYSTOSCOPY WITH STENT PLACEMENT Left 07/25/2012   Procedure: CYSTOSCOPY, left retograde pyelogram WITH left ureteral  STENT PLACEMENT;  Surgeon: Claybon Jabs, MD;  Location: [facility];  Service: Urology;  Laterality: Left;   CYSTOSCOPY/RETROGRADE/URETEROSCOPY  12/04/2011   Procedure: CYSTOSCOPY/RETROGRADE/URETEROSCOPY;  Surgeon: Molli Hazard, MD;  Location: [facility];  Service: Urology;  Laterality: Right;   CYSTOSCOPY/RETROGRADE/URETEROSCOPY/STONE EXTRACTION WITH BASKET Left 09/03/2012   Procedure: CYSTOSCOPY/RETROGRADE pyelogram/digital URETEROSCOPY/STONE EXTRACTION WITH BASKET, left stent removal;  Surgeon: Molli Hazard, MD;  Location: [facility];   Service: Urology;  Laterality: Left;   ESOPHAGOGASTRODUODENOSCOPY  2008   MGQ:QPYPP hiatal hernia./Normal esophagus without evidence of Barrett's mass, stricture, erosion or ulceration./Normal duodenal bulb and second portion of the duodenum./Diffuse erythema in the body and the antrum with occasional erosion.  Biopsies obtained via cold forceps to evaluate for H. pylori gastritis   FRACTURE SURGERY  1999   right clavicle   HOLMIUM LASER APPLICATION Left 5/0/9326   Procedure: HOLMIUM LASER APPLICATION;  Surgeon: Molli Hazard, MD;  Location: [facility];  Service: Urology;  Laterality: Left;   KNEE ARTHROSCOPY  10/04/2010,    right knee arthroscopy, dr Theda Sers   LEFT HEART CATH AND CORONARY ANGIOGRAPHY N/A 05/08/2018   Procedure: LEFT HEART CATH AND CORONARY ANGIOGRAPHY;  Surgeon: Troy Sine, MD;  Location: [place] [facility];  Service: Cardiovascular;  Laterality: N/A;   left knee arthroscopic surgery  1999   Left salphingectomy secondary to ectopic pregnancy  [place] CATH N/A 07/07/2019   Procedure: RIGHT HEART CATH;  Surgeon: Leonie Man, MD;  Location: [place] [facility];  Service: Cardiovascular;  Laterality: N/A;   rotary cuff  Right 05/14/2014   [facility]   SPINE SURGERY N/A    Phreesia 06/02/2020   UPPER GASTROINTESTINAL ENDOSCOPY  2008 abd pain   H. pylori gastritis   UPPER GASTROINTESTINAL ENDOSCOPY  [place]   PUD   Family History  Problem Relation Age of Onset   Heart disease Mother    Hypertension Mother    Cancer Mother        Cervical    Asthma Mother    Heart attack Father    Cancer Father        Prostate   Colon cancer Father        DECEASED AGE 29   Colon polyps Neg Hx    Social History   Socioeconomic History   Marital status: Single    Spouse name: Not on file   Number of children: 1   Years of education: 12   Highest education level: 12th grade  Occupational History   Occupation: Disable    Tobacco Use   Smoking  status: Never   Smokeless tobacco: Never  Vaping Use   Vaping Use: Never used  Substance and Sexual Activity   Alcohol use: No   Drug use: No   Sexual activity: Not Currently    Partners: Male    Birth control/protection: Surgical    Comment: hyst  Other Topics Concern   Not on file  Social History Narrative   Lives alone    Social Determinants of Health   Financial Resource Strain: Low Risk    Difficulty of Paying Living Expenses: Not very hard  Food Insecurity: No Food Insecurity   Worried About Charity fundraiser in the Last Year: Never true   Ran Out of Food in the Last Year: Never true  Transportation Needs: No Transportation Needs   Lack of Transportation (Medical): No   Lack of Transportation (Non-Medical): No  Physical Activity: Sufficiently Active   Days of Exercise per Week: 5 days   Minutes of Exercise per Session: 30 min  Stress: Not on file  Social Connections: Socially Isolated   Frequency of Communication with Friends and Family: More than three times a week   Frequency of Social Gatherings with Friends and Family: More than three times a week   Attends Religious Services: Never   Marine scientist or Organizations: No   Attends Music therapist: Never   Marital Status: Divorced    Tobacco Counseling Counseling given: Not Answered   Clinical Intake:  Pre-visit preparation completed: Yes  Pain : 0-10 Pain Score: 8  Pain Type: Chronic pain     Nutritional Status: BMI > 30  Obese Diabetes: Yes CBG done?: No Did pt. bring in CBG monitor from home?: No  How often do you need to have someone help you when you read instructions, pamphlets, or other written materials from your doctor or pharmacy?: 1 - Never What is the last grade level you completed in school?: 12th  Diabetic?yes         Activities of Daily Living In your present state of health, do you have any difficulty performing the following  activities: 04/18/2021  Hearing? N  Vision? N  Difficulty concentrating or making decisions? N  Walking or climbing stairs? Y  Dressing or bathing? N  Doing errands, shopping? N  Preparing Food and eating ? N  Using the Toilet? N  In the past six months, have you accidently leaked urine? N  Do you have problems with loss of bowel control? N  Managing your Medications? Y  Managing your Finances? N  Housekeeping or managing your Housekeeping? Y  Some recent data might be hidden    Patient Care Team: Fayrene Helper, MD as PCP - General Elouise Munroe, MD as PCP - Cardiology (Cardiology) Danie Binder, MD (Inactive) (Gastroenterology) Rolan Bucco, MD as Attending Physician (Urology) Sydnee Cabal, MD as Consulting Physician (Orthopedic Surgery) [place], Clarene Duke, OT as Occupational Therapist (Occupational Therapy)  Indicate any recent Medical Services you may have received from other than [facility] providers in the past year (date may be approximate).     Assessment:   This is a routine wellness examination for Angela.  Hearing/Vision screen No results found.  Dietary issues and exercise activities discussed: Current Exercise Habits: Home exercise routine, Type of exercise: walking, Time (Minutes): 30, Frequency (Times/Week): 4, Weekly Exercise (Minutes/Week): 120, Intensity: Mild, Exercise limited by: orthopedic condition(s)   Goals Addressed   None    Depression Screen [facility] 2/9 Scores 03/03/2021 03/02/2021  11/30/2020 11/24/2020 09/20/2020 08/30/2020 08/26/2020  PHQ - 2 Score 0 _0 PHQ- 9 Score 0 _1 Fall Risk Fall Risk  03/03/2021 12/02/2020 09/20/2020 09/20/2020 09/20/2020  Falls in the past year? 1 0 0 0 1  Number falls in past yr: 0 0 - - 0  Injury with Fall? 0 0 - - 0  Risk for fall due to : - - Impaired balance/gait;Impaired mobility - No Fall Risks  Follow up - - Falls evaluation completed Falls evaluation completed Falls evaluation  completed    FALL RISK PREVENTION PERTAINING TO THE HOME:  Any stairs in or around the home? No  If so, are there any without handrails? No  Home free of loose throw rugs in walkways, pet beds, electrical cords, etc? Yes  Adequate lighting in your home to reduce risk of falls? Yes   ASSISTIVE DEVICES UTILIZED TO PREVENT FALLS:  Life alert? Yes  Use of a cane, walker or w/c? Yes  Grab bars in the bathroom? Yes  Shower chair or bench in shower? Yes  Elevated toilet seat or a handicapped toilet? Yes     6CIT Screen 04/18/2021 04/15/2019 04/10/2018 05/03/2016  What Year? 0 points 0 points 0 points 0 points  What month? 0 points 0 points 0 points 0 points  What time? 0 points 0 points 0 points 0 points  Count back from 20 0 points 0 points 0 points 0 points  Months in reverse 0 points 0 points 0 points 0 points  Repeat phrase 0 points 0 points 4 points 0 points  Total Score 0 0 4 0    Immunizations Immunization History  Administered Date(s) Administered   Influenza Split 03/30/2011   Influenza Whole 03/21/2005, 02/09/2006   Influenza,inj,Quad PF,6+ Mos 02/11/2014, 07/27/2015, 03/09/2016, 01/29/2017, 02/07/2018, 01/30/2019, 02/11/2020, 03/03/2021   Influenza-Unspecified 01/29/2017   PFIZER(Purple Top)SARS-COV-2 Vaccination 07/22/2019, 08/12/2019, 03/05/2020, 11/19/2020   Pneumococcal Polysaccharide-23 04/17/2006, 09/14/2015   Td 04/17/2006    TDAP status: Up to date  Flu Vaccine status: Up to date  Pneumococcal vaccine status: Up to date  Covid-19 vaccine status: Completed vaccines  Qualifies for Shingles Vaccine? No   Zostavax completed No   Shingrix Completed?: No.    Education has been provided regarding the importance of this vaccine. Patient has been advised to call insurance company to determine out of pocket expense if they have not yet received this vaccine. Advised may also receive vaccine at local pharmacy or Health Dept. Verbalized acceptance and  understanding.  Screening Tests Health Maintenance  Topic Date Due   Zoster Vaccines- Shingrix (1 of 2) Never done   COVID-19 Vaccine (5 - Booster for Pfizer series) 01/14/2021   Pneumococcal Vaccine 66-79 Years old (2 - PCV) 03/23/2022 (Originally 09/13/2016)   TETANUS/TDAP  06/21/2022 (Originally 04/17/2016)   HEMOGLOBIN A1C  09/20/2021   OPHTHALMOLOGY EXAM  01/19/2022   FOOT EXAM  03/07/2022   MAMMOGRAM  09/07/2022   COLONOSCOPY (Pts 45-44yr Insurance coverage will need to be confirmed)  10/06/2029   INFLUENZA VACCINE  Completed   Hepatitis C Screening  Completed   HIV Screening  Completed   HPV VACCINES  Aged Out   PAP SMEAR-Modifier  Discontinued    Health Maintenance  Health Maintenance Due  Topic Date Due   Zoster Vaccines- Shingrix (1 of 2) Never done   COVID-19 Vaccine (5 - Booster for Pfizer series) 01/14/2021    Colorectal cancer screening:  Type of screening: Colonoscopy. Completed 2021. Repeat every 10 years  Mammogram status: Completed 09/06/20. Repeat every year    Lung Cancer Screening: (Low Dose CT Chest recommended if Age 65-80 years, 30 pack-year currently smoking OR have quit w/in 15years.) does not qualify.   Lung Cancer Screening Referral: na  Additional Screening:  Hepatitis C Screening: does not qualify; Completed yes  Vision Screening: Recommended annual ophthalmology exams for early detection of glaucoma and other disorders of the eye. Is the patient up to date with their annual eye exam?  Yes  Who is the provider or what is the name of the office in which the patient attends annual eye exams? [facility]  If pt is not established with a provider, would they like to be referred to a provider to establish care?   Dental Screening: Recommended annual dental exams for proper oral hygiene  Community Resource Referral / Chronic Care Management: CRR required this visit?  No   CCM required this visit?  No      Plan:     I have personally  reviewed and noted the following in the patients chart:   Medical and social history Use of alcohol, tobacco or illicit drugs  Current medications and supplements including opioid prescriptions.  Functional ability and status Nutritional status Physical activity Advanced directives List of other physicians Hospitalizations, surgeries, and ER visits in previous 12 months Vitals Screenings to include cognitive, depression, and falls Referrals and appointments  In addition, I have reviewed and discussed with patient certain preventive protocols, quality metrics, and best practice recommendations. A written personalized care plan for preventive services as well as general preventive health recommendations were provided to patient.     Kate Sable, LPN, LPN   23/55/[id number]   Nurse Notes:  Ms. Faerber , Thank you for taking time to come for your Medicare Wellness Visit. I appreciate your ongoing commitment to your health goals. Please review the following plan we discussed and let me know if I can assist you in the future.   These are the goals we discussed:  Goals      DIET - DECREASE SODA OR JUICE INTAKE     Exercise 3x per week (30 min per time)     Patient would like to start walking 3 times a week for 60 minutes at a time.        This is a list of the screening recommended for you and due dates:  Health Maintenance  Topic Date Due   Zoster (Shingles) Vaccine (1 of 2) Never done   COVID-19 Vaccine (5 - Booster for Pfizer series) 01/14/2021   Pneumococcal Vaccination (2 - PCV) 03/23/2022*   Tetanus Vaccine  06/21/2022*   Hemoglobin A1C  09/20/2021   Eye exam for diabetics  01/19/2022   Complete foot exam   03/07/2022   Mammogram  09/07/2022   Colon Cancer Screening  10/06/2029   Flu Shot  Completed   Hepatitis C Screening: USPSTF Recommendation to screen - Ages 18-79 yo.  Completed   HIV Screening  Completed   HPV Vaccine  Aged Out   Pap Smear  Discontinued  *Topic was  postponed. The date shown is not the original due date.

## 2021-04-18 NOTE — Patient Instructions (Addendum)
[name] , Thank you for taking time to come for your Medicare Wellness Visit. I appreciate your ongoing commitment to your health goals. Please review the following plan we discussed and let me know if I can assist you in the future.   These are the goals we discussed:  Goals      DIET - DECREASE SODA OR JUICE INTAKE     Exercise 3x per week (30 min per time)     Patient would like to start walking 3 times a week for 60 minutes at a time.        This is a list of the screening recommended for you and due dates:  Health Maintenance  Topic Date Due   Zoster (Shingles) Vaccine (1 of 2) Never done   COVID-19 Vaccine (5 - Booster for Pfizer series) 01/14/2021   Pneumococcal Vaccination (2 - PCV) 03/23/2022*   Tetanus Vaccine  06/21/2022*   Hemoglobin A1C  09/20/2021   Eye exam for diabetics  01/19/2022   Complete foot exam   03/07/2022   Mammogram  09/07/2022   Colon Cancer Screening  10/06/2029   Flu Shot  Completed   Hepatitis C Screening: USPSTF Recommendation to screen - Ages 18-79 yo.  Completed   HIV Screening  Completed   HPV Vaccine  Aged Out   Pap Smear  Discontinued  *Topic was postponed. The date shown is not the original due date.      Health Maintenance, Female Adopting a healthy lifestyle and getting preventive care are important in promoting health and wellness. Ask your health care provider about: The right schedule for you to have regular tests and exams. Things you can do on your own to prevent diseases and keep yourself healthy. What should I know about diet, weight, and exercise? Eat a healthy diet  Eat a diet that includes plenty of vegetables, fruits, low-fat dairy products, and lean protein. Do not eat a lot of foods that are high in solid fats, added sugars, or sodium. Maintain a healthy weight Body mass index (BMI) is used to identify weight problems. It estimates body fat based on height and weight. Your health care provider can help determine your  BMI and help you achieve or maintain a healthy weight. Get regular exercise Get regular exercise. This is one of the most important things you can do for your health. Most adults should: Exercise for at least 150 minutes each week. The exercise should increase your heart rate and make you sweat (moderate-intensity exercise). Do strengthening exercises at least twice a week. This is in addition to the moderate-intensity exercise. Spend less time sitting. Even light physical activity can be beneficial. Watch cholesterol and blood lipids Have your blood tested for lipids and cholesterol at 60 years of age, then have this test every 5 years. Have your cholesterol levels checked more often if: Your lipid or cholesterol levels are high. You are older than 60 years of age. You are at high risk for heart disease. What should I know about cancer screening? Depending on your health history and family history, you may need to have cancer screening at various ages. This may include screening for: Breast cancer. Cervical cancer. Colorectal cancer. Skin cancer. Lung cancer. What should I know about heart disease, diabetes, and high blood pressure? Blood pressure and heart disease High blood pressure causes heart disease and increases the risk of stroke. This is more likely to develop in people who have high blood pressure readings or are  overweight. Have your blood pressure checked: Every 3-5 years if you are 84-79 years of age. Every year if you are 14 years old or older. Diabetes Have regular diabetes screenings. This checks your fasting blood sugar level. Have the screening done: Once every three years after age 53 if you are at a normal weight and have a low risk for diabetes. More often and at a younger age if you are overweight or have a high risk for diabetes. What should I know about preventing infection? Hepatitis B If you have a higher risk for hepatitis B, you should be screened for this  virus. Talk with your health care provider to find out if you are at risk for hepatitis B infection. Hepatitis C Testing is recommended for: Everyone born from [birth year] through 1965. Anyone with known risk factors for hepatitis C. Sexually transmitted infections (STIs) Get screened for STIs, including gonorrhea and chlamydia, if: You are sexually active and are younger than 60 years of age. You are older than 60 years of age and your health care provider tells you that you are at risk for this type of infection. Your sexual activity has changed since you were last screened, and you are at increased risk for chlamydia or gonorrhea. Ask your health care provider if you are at risk. Ask your health care provider about whether you are at high risk for HIV. Your health care provider may recommend a prescription medicine to help prevent HIV infection. If you choose to take medicine to prevent HIV, you should first get tested for HIV. You should then be tested every 3 months for as long as you are taking the medicine. Pregnancy If you are about to stop having your period (premenopausal) and you may become pregnant, seek counseling before you get pregnant. Take 400 to 800 micrograms (mcg) of folic acid every day if you become pregnant. Ask for birth control (contraception) if you want to prevent pregnancy. Osteoporosis and menopause Osteoporosis is a disease in which the bones lose minerals and strength with aging. This can result in bone fractures. If you are 18 years old or older, or if you are at risk for osteoporosis and fractures, ask your health care provider if you should: Be screened for bone loss. Take a calcium or vitamin D supplement to lower your risk of fractures. Be given hormone replacement therapy (HRT) to treat symptoms of menopause. Follow these instructions at home: Alcohol use Do not drink alcohol if: Your health care provider tells you not to drink. You are pregnant, may be pregnant,  or are planning to become pregnant. If you drink alcohol: Limit how much you have to: 0-1 drink a day. Know how much alcohol is in your drink. In the [place] one drink equals one 12 oz bottle of beer (355 mL), one 5 oz glass of wine (148 mL), or one 1 oz glass of hard liquor (44 mL). Lifestyle Do not use any products that contain nicotine or tobacco. These products include cigarettes, chewing tobacco, and vaping devices, such as e-cigarettes. If you need help quitting, ask your health care provider. Do not use street drugs. Do not share needles. Ask your health care provider for help if you need support or information about quitting drugs. General instructions Schedule regular health, dental, and eye exams. Stay current with your vaccines. Tell your health care provider if: You often feel depressed. You have ever been abused or do not feel safe at home. Summary Adopting a healthy lifestyle and  getting preventive care are important in promoting health and wellness. Follow your health care provider's instructions about healthy diet, exercising, and getting tested or screened for diseases. Follow your health care provider's instructions on monitoring your cholesterol and blood pressure. This information is not intended to replace advice given to you by your health care provider. Make sure you discuss any questions you have with your health care provider. Document Revised: 09/06/2020 Document Reviewed: 09/06/2020 Elsevier Patient Education  [employer].

## 2021-04-20 ENCOUNTER — Other Ambulatory Visit: Payer: Self-pay | Admitting: "Endocrinology

## 2021-04-26 ENCOUNTER — Other Ambulatory Visit: Payer: Self-pay | Admitting: Pulmonary Disease

## 2021-04-26 ENCOUNTER — Other Ambulatory Visit: Payer: Self-pay | Admitting: Family Medicine

## 2021-04-28 ENCOUNTER — Ambulatory Visit (INDEPENDENT_AMBULATORY_CARE_PROVIDER_SITE_OTHER): Payer: Medicare Other | Admitting: Licensed Clinical Social Worker

## 2021-04-28 ENCOUNTER — Other Ambulatory Visit: Payer: Self-pay

## 2021-04-28 DIAGNOSIS — F322 Major depressive disorder, single episode, severe without psychotic features: Secondary | ICD-10-CM

## 2021-05-03 ENCOUNTER — Other Ambulatory Visit: Payer: Self-pay | Admitting: Family Medicine

## 2021-05-03 DIAGNOSIS — M62830 Muscle spasm of back: Secondary | ICD-10-CM

## 2021-05-03 NOTE — Progress Notes (Signed)
See note
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
## 2021-05-06 DIAGNOSIS — I2510 Atherosclerotic heart disease of native coronary artery without angina pectoris: Secondary | ICD-10-CM | POA: Diagnosis not present

## 2021-05-06 DIAGNOSIS — I10 Essential (primary) hypertension: Secondary | ICD-10-CM | POA: Diagnosis not present

## 2021-05-06 DIAGNOSIS — E119 Type 2 diabetes mellitus without complications: Secondary | ICD-10-CM | POA: Diagnosis not present

## 2021-05-06 DIAGNOSIS — R0609 Other forms of dyspnea: Secondary | ICD-10-CM | POA: Diagnosis not present

## 2021-05-06 DIAGNOSIS — M5416 Radiculopathy, lumbar region: Secondary | ICD-10-CM | POA: Diagnosis not present

## 2021-05-08 ENCOUNTER — Other Ambulatory Visit: Payer: Self-pay | Admitting: Family Medicine

## 2021-05-12 ENCOUNTER — Ambulatory Visit (INDEPENDENT_AMBULATORY_CARE_PROVIDER_SITE_OTHER): Payer: Medicaid Other | Admitting: Licensed Clinical Social Worker

## 2021-05-12 ENCOUNTER — Other Ambulatory Visit: Payer: Self-pay

## 2021-05-12 DIAGNOSIS — F322 Major depressive disorder, single episode, severe without psychotic features: Secondary | ICD-10-CM

## 2021-05-13 NOTE — BH Specialist Note (Signed)
[place] Follow Up Assessment  MRN: [id number] NAME: Angela [name] Date: 04/14/21  Start time: 230 End time: 250 Total time: 20  Type of Contact: Follow up Call  Current concerns/stressors: past trauma  Functional Assessment:  Sleep: poor Appetite: fair Coping ability: overwhelmed Patient taking medications as prescribed:  yes  Current medications:  Outpatient Encounter Medications as of 04/14/2021  Medication Sig   albuterol (VENTOLIN HFA) 108 (90 Base) MCG/ACT inhaler Inhale 2 puffs into the lungs every 6 (six) hours as needed for wheezing or shortness of breath.   amLODipine (NORVASC) 10 MG tablet TAKE 1 TABLET BY MOUTH EVERY DAY   betamethasone, augmented, (DIPROLENE) 0.05 % lotion Apply 1 application topically 2 (two) times daily.   blood glucose meter kit and supplies Dispense based on patient and insurance preference. Once daily testing dx. e11.9   calcipotriene (DOVONOX) 0.005 % ointment Apply 1 application topically daily.    clobetasol cream (TEMOVATE) 8.41 % Apply 1 application topically 2 (two) times daily.   cloNIDine (CATAPRES - DOSED IN MG/24 HR) 0.3 mg/24hr patch APPLY 1 PATCH TO SKIN ONCE A WEEK   cloNIDine (CATAPRES) 0.3 MG tablet TAKE 1 TABLET (0.3 MG TOTAL) BY MOUTH AT BEDTIME.   diazepam (VALIUM) 5 MG tablet Take one tablet  by mouth three times daily for muscle spasm   diazepam (VALIUM) 5 MG tablet Take one tablet by mouth three times daily for spasm   DULoxetine (CYMBALTA) 60 MG capsule TAKE 1 CAPSULE BY MOUTH TWICE A DAY   ergocalciferol (VITAMIN D2) 1.25 MG (50000 UT) capsule Take 1 capsule (50,000 Units total) by mouth once a week. One capsule once weekly   fluticasone (FLONASE) 50 MCG/ACT nasal spray Place 2 sprays into both nostrils daily.   glipiZIDE (GLUCOTROL XL) 10 MG 24 hr tablet TAKE 1 TABLET BY MOUTH EVERY DAY WITH BREAKFAST   hydrOXYzine (ATARAX/VISTARIL) 10 MG tablet Take one tablet by mouth art bedtime   Insulin Lispro Prot &  Lispro (HUMALOG MIX 75/25 KWIKPEN) (75-25) 100 UNIT/ML Kwikpen Inject 30 Units into the skin 2 (two) times daily with a meal.   ipratropium-albuterol (DUONEB) 0.5-2.5 (3) MG/3ML SOLN Take 3 mLs by nebulization every 6 (six) hours as needed.   KLOR-CON M20 20 MEQ tablet TAKE 1 TABLET BY MOUTH TWICE A DAY   levothyroxine (SYNTHROID) 75 MCG tablet TAKE 1 TABLET BY MOUTH EVERY DAY   loratadine (CLARITIN) 10 MG tablet TAKE 1 TABLET BY MOUTH EVERY DAY   metFORMIN (GLUCOPHAGE) 500 MG tablet TAKE 1 TABLET BY MOUTH 2 TIMES DAILY WITH A MEAL.   metoprolol tartrate (LOPRESSOR) 25 MG tablet Take 1 tablet (25 mg total) by mouth 2 (two) times daily.   naloxone (NARCAN) nasal spray 4 mg/0.1 mL One spray into each nostril once as needed   nitroGLYCERIN (NITROSTAT) 0.4 MG SL tablet Place 1 tablet (0.4 mg total) under the tongue every 5 (five) minutes as needed for chest pain.   [EXPIRED] oxyCODONE (OXYCONTIN) 40 mg 12 hr tablet Take one tablet by mouth twice daily for chronic pain   oxyCODONE (OXYCONTIN) 40 mg 12 hr tablet Take one tablet by mouth two times daily for chronic pain   rosuvastatin (CRESTOR) 5 MG tablet Take one tablet by mouth every Monday, Wednesday and Friday   SYMBICORT 160-4.5 MCG/ACT inhaler TAKE 2 PUFFS BY MOUTH TWICE A DAY   triamterene-hydrochlorothiazide (MAXZIDE) 75-50 MG tablet Take 1 tablet by mouth daily.   UNABLE TO FIND Sharps container 6Y or  PRN   [DISCONTINUED] cyclobenzaprine (FLEXERIL) 10 MG tablet TAKE 1 TABLET BY MOUTH THREE TIMES A DAY   [DISCONTINUED] esomeprazole (NEXIUM) 40 MG capsule TAKE 1 CAPSULE BY MOUTH EVERY DAY   [DISCONTINUED] furosemide (LASIX) 40 MG tablet TAKE 1 TABLET BY MOUTH EVERY DAY   [DISCONTINUED] gabapentin (NEURONTIN) 800 MG tablet Take 1 tablet (800 mg total) by mouth 4 (four) times daily.   [DISCONTINUED] Lancets (ONETOUCH DELICA PLUS [id number]) MISC TEST ONCE DAILY   [DISCONTINUED] ONETOUCH VERIO test strip USE AS INSTRUCTED FOUR TIMES DAILY TESTING DX  E11.65   [DISCONTINUED] spironolactone (ALDACTONE) 25 MG tablet TAKE 1 TABLET (25 MG TOTAL) BY MOUTH DAILY.   No facility-administered encounter medications on file as of 04/14/2021.    Self-harm and/or Suicidal Behaviors Risk Assessment Self-harm risk factors: no Patient endorses recent self injurious thoughts and/or behaviors: No   Suicide ideations: No plan to harm self or others   Danger to Others Risk Assessment Danger to others risk factors: no Patient endorses recent thoughts of harming others: No    Substance Use Assessment Patient recently consumed alcohol: No  Patient recently used drugs: No  Patient is concerned about dependence or abuse of substances: No    Goals, Interventions and Follow-up Plan Goals: Increase healthy adjustment to current life circumstances Interventions: Mindfulness or Relaxation Training and Behavioral Activation   Summary of Angela [name] was present and interactive throughout th session.  She reports that she continues to have conversation with her siblings about her past trauma. She has an increase in irritability but is managing with relaxation techniques. Her daughter lives with her and they have a good relationship.  LCSW offered education about common irrational fears and beliefs that contribute to anxiety. Reviewed and showed client how to use CBT/REBT strategies to recognize and re-frame irrational fears and self-talk as a means of increasing client's capacity to handle their anxiety more constructively.    Follow-up Plan:  biweekly VBH session Angela South, LCSW

## 2021-05-13 NOTE — Progress Notes (Signed)
See [place] note

## 2021-05-17 ENCOUNTER — Other Ambulatory Visit: Payer: Self-pay

## 2021-05-17 ENCOUNTER — Ambulatory Visit (HOSPITAL_COMMUNITY)
Admission: RE | Admit: 2021-05-17 | Discharge: 2021-05-17 | Disposition: A | Discharge status: Home / Self Care | Payer: Medicare Other | Source: Ambulatory Visit | Attending: Family Medicine | Admitting: Family Medicine

## 2021-05-17 ENCOUNTER — Telehealth: Payer: Medicare Other

## 2021-05-17 DIAGNOSIS — R928 Other abnormal and inconclusive findings on diagnostic imaging of breast: Secondary | ICD-10-CM | POA: Insufficient documentation

## 2021-05-17 DIAGNOSIS — N6002 Solitary cyst of left breast: Secondary | ICD-10-CM | POA: Diagnosis not present

## 2021-05-17 DIAGNOSIS — R922 Inconclusive mammogram: Secondary | ICD-10-CM | POA: Diagnosis not present

## 2021-05-18 ENCOUNTER — Telehealth (INDEPENDENT_AMBULATORY_CARE_PROVIDER_SITE_OTHER): Payer: Medicare Other | Admitting: Licensed Clinical Social Worker

## 2021-05-18 DIAGNOSIS — F322 Major depressive disorder, single episode, severe without psychotic features: Secondary | ICD-10-CM

## 2021-05-18 NOTE — BH Specialist Note (Signed)
Virtual Behavioral Health Treatment Plan Team Note  MRN: [id number] NAME: Angela [name]  DATE: 05/18/21  Start time:   338pEnd time:  343p Total time:  5 min  Total number of Virtual [place] Treatment Team Plan encounters: 4/4  Treatment Team Attendees: Royal Piedra, LCSW & Dr. Modesta Messing  Diagnoses: No diagnosis found.  Goals, Interventions and Follow-up Plan Goals: Increase healthy adjustment to current life circumstances Interventions: Mindfulness or Relaxation Training Medication Management Recommendations: consider starting Bupropion 150mg  daily Follow-up Plan: Biweekly vbh session  History of the present illness Presenting Problem/Current Symptoms: continued symptoms of DX  Psychiatric History  Depression: No Anxiety: No Mania: No Psychosis: No PTSD symptoms: No  Past Psychiatric History/Hospitalization(s): Hospitalization for psychiatric illness: No Prior Suicide Attempts: No Prior Self-injurious behavior: No  Psychosocial stressors trauma  Self-harm Behaviors Risk Assessment   Screenings PHQ-9 Assessments:  Depression screen [facility] 2/9 03/03/2021 03/02/2021 11/30/2020  Decreased Interest 0 3 3  Down, Depressed, Hopeless 0 3 3  PHQ - 2 Score 0 6 6  Altered sleeping 0 2 3  Tired, decreased energy 0 2 3  Change in appetite 0 2 2  Feeling bad or failure about yourself  0 2 2  Trouble concentrating 0 2 2  Moving slowly or fidgety/restless 0 2 0  Suicidal thoughts 0 0 0  PHQ-9 Score 0 18 18  Difficult doing work/chores - Very difficult Very difficult  Some recent data might be hidden   GAD-7 Assessments:  GAD 7 : Generalized Anxiety Score 11/30/2020 11/24/2020 10/10/2019  Nervous, Anxious, on Edge 1 2 0  Control/stop worrying 1 1 0  Worry too much - different things 1 1 0  Trouble relaxing 0 1 0  Restless 0 0 0  Easily annoyed or irritable 0 1 0  Afraid - awful might happen 0 1 0  Total GAD 7 Score 3 7 0  Anxiety Difficulty Not difficult at all Somewhat difficult  Not difficult at all    Past Medical History Past Medical History:  Diagnosis Date   Allergy    Phreesia 06/02/2020   Anxiety    Arthritis    Asthma    Asthma    Phreesia 04/12/2020   Breast mass    left (monitor)   CHF (congestive heart failure) ([place])    Phreesia 04/12/2020   Coronary atherosclerosis of native coronary artery    Mild nonobstructive CAD by cardiac catheterization 2008 - Dr. Terrence Dupont   Degenerative disc disease    Depression    Depression    Phreesia 04/12/2020   Diabetes mellitus without complication ([place])    Phreesia 04/12/2020   Dysrhythmia    Essential hypertension, benign    Folliculitis 97/41/6384   GERD (gastroesophageal reflux disease)    H/O hiatal hernia    Helicobacter pylori gastritis 2008   Hypercholesterolemia    Hypertension    Hyperthyroidism    s/p radiation   Left-sided epistaxis 05/15/2018   Low back pain    Migraine    Nephrolithiasis    Recurring episodes since 2004   Neuromuscular disorder ([place])    Phreesia 04/12/2020   Severe obesity (BMI >= 40) (HCC)    Sleep apnea    STOP BANG SCORE 6no cpap used   Thyroid disease    Phreesia 04/12/2020   Type 2 diabetes mellitus with diabetic neuropathy (HCC)     Vital signs: There were no vitals filed for this visit.  Allergies:  Allergies as of 05/18/2021 - Review Complete 04/18/2021  Allergen Reaction Noted   Latex Itching and Rash 06/02/2020   Morphine Other (See Comments) 07/23/2007   Morphine and related Shortness Of Breath and Swelling 06/02/2020   Other Itching, Shortness Of Breath, and Swelling 04/12/2020   Ace inhibitors Cough 04/01/2013   Aspirin Other (See Comments) 07/23/2007   Losartan Cough 04/01/2013   Tapentadol Other (See Comments) 07/18/2016   Tomato Other (See Comments) 07/13/2013   Adhesive [tape] Rash 08/30/2012    Medication History Current medications:  Outpatient Encounter Medications as of 05/18/2021  Medication Sig   albuterol (VENTOLIN HFA) 108  (90 Base) MCG/ACT inhaler Inhale 2 puffs into the lungs every 6 (six) hours as needed for wheezing or shortness of breath.   amLODipine (NORVASC) 10 MG tablet TAKE 1 TABLET BY MOUTH EVERY DAY   betamethasone, augmented, (DIPROLENE) 0.05 % lotion Apply 1 application topically 2 (two) times daily.   blood glucose meter kit and supplies Dispense based on patient and insurance preference. Once daily testing dx. e11.9   calcipotriene (DOVONOX) 0.005 % ointment Apply 1 application topically daily.    clobetasol cream (TEMOVATE) 2.12 % Apply 1 application topically 2 (two) times daily.   cloNIDine (CATAPRES - DOSED IN MG/24 HR) 0.3 mg/24hr patch APPLY 1 PATCH TO SKIN ONCE A WEEK   cloNIDine (CATAPRES) 0.3 MG tablet TAKE 1 TABLET (0.3 MG TOTAL) BY MOUTH AT BEDTIME.   cyclobenzaprine (FLEXERIL) 10 MG tablet TAKE 1 TABLET BY MOUTH THREE TIMES A DAY   diazepam (VALIUM) 5 MG tablet Take one tablet  by mouth three times daily for muscle spasm   diazepam (VALIUM) 5 MG tablet Take one tablet by mouth three times daily for spasm   DULoxetine (CYMBALTA) 60 MG capsule TAKE 1 CAPSULE BY MOUTH TWICE A DAY   ergocalciferol (VITAMIN D2) 1.25 MG (50000 UT) capsule Take 1 capsule (50,000 Units total) by mouth once a week. One capsule once weekly   esomeprazole (NEXIUM) 40 MG capsule TAKE 1 CAPSULE BY MOUTH EVERY DAY   fluticasone (FLONASE) 50 MCG/ACT nasal spray Place 2 sprays into both nostrils daily.   furosemide (LASIX) 40 MG tablet TAKE 1 TABLET BY MOUTH EVERY DAY   gabapentin (NEURONTIN) 800 MG tablet TAKE 1 TABLET BY MOUTH 4 TIMES DAILY.   glipiZIDE (GLUCOTROL XL) 10 MG 24 hr tablet TAKE 1 TABLET BY MOUTH EVERY DAY WITH BREAKFAST   hydrOXYzine (ATARAX/VISTARIL) 10 MG tablet Take one tablet by mouth art bedtime   Insulin Lispro Prot & Lispro (HUMALOG MIX 75/25 KWIKPEN) (75-25) 100 UNIT/ML Kwikpen Inject 30 Units into the skin 2 (two) times daily with a meal.   ipratropium-albuterol (DUONEB) 0.5-2.5 (3) MG/3ML SOLN  Take 3 mLs by nebulization every 6 (six) hours as needed.   KLOR-CON M20 20 MEQ tablet TAKE 1 TABLET BY MOUTH TWICE A DAY   Lancets (ONETOUCH DELICA PLUS [id number]) MISC USE TO TEST ONCE DAILY   levothyroxine (SYNTHROID) 75 MCG tablet TAKE 1 TABLET BY MOUTH EVERY DAY   loratadine (CLARITIN) 10 MG tablet TAKE 1 TABLET BY MOUTH EVERY DAY   metFORMIN (GLUCOPHAGE) 500 MG tablet TAKE 1 TABLET BY MOUTH 2 TIMES DAILY WITH A MEAL.   metoprolol tartrate (LOPRESSOR) 25 MG tablet Take 1 tablet (25 mg total) by mouth 2 (two) times daily.   naloxone (NARCAN) nasal spray 4 mg/0.1 mL One spray into each nostril once as needed   nitroGLYCERIN (NITROSTAT) 0.4 MG SL tablet Place 1 tablet (0.4 mg total) under the tongue every 5 (five) minutes  as needed for chest pain.   ONETOUCH VERIO test strip USE AS INSTRUCTED FOUR TIMES DAILY TESTING DX E11.65   oxyCODONE (OXYCONTIN) 40 mg 12 hr tablet Take one tablet by mouth two times daily for chronic pain   rosuvastatin (CRESTOR) 5 MG tablet Take one tablet by mouth every Monday, Wednesday and Friday   spironolactone (ALDACTONE) 25 MG tablet TAKE 1 TABLET (25 MG TOTAL) BY MOUTH DAILY.   SYMBICORT 160-4.5 MCG/ACT inhaler TAKE 2 PUFFS BY MOUTH TWICE A DAY   triamterene-hydrochlorothiazide (MAXZIDE) 75-50 MG tablet Take 1 tablet by mouth daily.   UNABLE TO FIND Sharps container 6Y or PRN   zolpidem (AMBIEN) 10 MG tablet Take 1 tablet (10 mg total) by mouth at bedtime.   No facility-administered encounter medications on file as of 05/18/2021.     Scribe for Treatment Team: Lubertha South, LCSW

## 2021-05-18 NOTE — Progress Notes (Signed)
Virtual behavioral Health Initiative ([place]) Psychiatric Consultant Case Review  Angela [name] is a 61 y.o. year old female with a history of PTSD, type II diabetes, hypertension, hyperlipidemia, diabetes, CHF,  hyperthyroidism. She has a trauma history as a child. Employed, divorced. She tends to stay in the house, watching TV. She would like to have a "happy life."   Assessment/Provisional Diagnosis # MDD # PTSD She will benefit from bupropion as adjunctive treatment for depression.  [place] specialist to work on mindfulness technique, which will be beneficial for her PTSD symptoms.   Recommendation  -Consider starting bupropion 150 mg daily -Continue duloxetine 60 mg twice a day - on valium 5 mg daily as needed for anxiety; recommend to use this medication only for short term to avoid any risk of dependence/oversedation -On hydroxyzine 10 mg as needed for anxiety -BH specialist to work on Microbiologist, behavioral activation  Thank you for your consult. We will continue to follow the patient. Please contact [facility]  for any questions or concerns.   The above treatment considerations and suggestions are based on consultation with the [facility] specialist and/or PCP and a review of information available in the shared registry and the patients [place] (EHR). I have not personally examined the patient. All recommendations should be implemented with consideration of the patient's relevant prior history and current clinical status. Please feel free to call me with any questions about the care of this patient.

## 2021-05-18 NOTE — BH Specialist Note (Signed)
Angela [name] Follow Up Assessment  MRN: [id number] NAME: Angela [name] Date: 05/12/21  Start time: 130p End time: 150p Total time: 20  Type of Contact: Follow up Call  Current concerns/stressors: trauma  Functional Assessment:  Sleep: fair Appetite: fair Coping ability: overwhelmed Patient taking medications as prescribed:    Current medications:  Outpatient Encounter Medications as of 05/12/2021  Medication Sig   albuterol (VENTOLIN HFA) 108 (90 Base) MCG/ACT inhaler Inhale 2 puffs into the lungs every 6 (six) hours as needed for wheezing or shortness of breath.   amLODipine (NORVASC) 10 MG tablet TAKE 1 TABLET BY MOUTH EVERY DAY   betamethasone, augmented, (DIPROLENE) 0.05 % lotion Apply 1 application topically 2 (two) times daily.   blood glucose meter kit and supplies Dispense based on patient and insurance preference. Once daily testing dx. e11.9   calcipotriene (DOVONOX) 0.005 % ointment Apply 1 application topically daily.    clobetasol cream (TEMOVATE) 5.36 % Apply 1 application topically 2 (two) times daily.   cloNIDine (CATAPRES - DOSED IN MG/24 HR) 0.3 mg/24hr patch APPLY 1 PATCH TO SKIN ONCE A WEEK   cloNIDine (CATAPRES) 0.3 MG tablet TAKE 1 TABLET (0.3 MG TOTAL) BY MOUTH AT BEDTIME.   cyclobenzaprine (FLEXERIL) 10 MG tablet TAKE 1 TABLET BY MOUTH THREE TIMES A DAY   diazepam (VALIUM) 5 MG tablet Take one tablet  by mouth three times daily for muscle spasm   diazepam (VALIUM) 5 MG tablet Take one tablet by mouth three times daily for spasm   DULoxetine (CYMBALTA) 60 MG capsule TAKE 1 CAPSULE BY MOUTH TWICE A DAY   ergocalciferol (VITAMIN D2) 1.25 MG (50000 UT) capsule Take 1 capsule (50,000 Units total) by mouth once a week. One capsule once weekly   esomeprazole (NEXIUM) 40 MG capsule TAKE 1 CAPSULE BY MOUTH EVERY DAY   fluticasone (FLONASE) 50 MCG/ACT nasal spray Place 2 sprays into both nostrils daily.   furosemide (LASIX) 40 MG tablet TAKE 1 TABLET BY  MOUTH EVERY DAY   gabapentin (NEURONTIN) 800 MG tablet TAKE 1 TABLET BY MOUTH 4 TIMES DAILY.   glipiZIDE (GLUCOTROL XL) 10 MG 24 hr tablet TAKE 1 TABLET BY MOUTH EVERY DAY WITH BREAKFAST   hydrOXYzine (ATARAX/VISTARIL) 10 MG tablet Take one tablet by mouth art bedtime   Insulin Lispro Prot & Lispro (HUMALOG MIX 75/25 KWIKPEN) (75-25) 100 UNIT/ML Kwikpen Inject 30 Units into the skin 2 (two) times daily with a meal.   ipratropium-albuterol (DUONEB) 0.5-2.5 (3) MG/3ML SOLN Take 3 mLs by nebulization every 6 (six) hours as needed.   KLOR-CON M20 20 MEQ tablet TAKE 1 TABLET BY MOUTH TWICE A DAY   Lancets (ONETOUCH DELICA PLUS [id number]) MISC USE TO TEST ONCE DAILY   levothyroxine (SYNTHROID) 75 MCG tablet TAKE 1 TABLET BY MOUTH EVERY DAY   loratadine (CLARITIN) 10 MG tablet TAKE 1 TABLET BY MOUTH EVERY DAY   metFORMIN (GLUCOPHAGE) 500 MG tablet TAKE 1 TABLET BY MOUTH 2 TIMES DAILY WITH A MEAL.   metoprolol tartrate (LOPRESSOR) 25 MG tablet Take 1 tablet (25 mg total) by mouth 2 (two) times daily.   naloxone (NARCAN) nasal spray 4 mg/0.1 mL One spray into each nostril once as needed   nitroGLYCERIN (NITROSTAT) 0.4 MG SL tablet Place 1 tablet (0.4 mg total) under the tongue every 5 (five) minutes as needed for chest pain.   ONETOUCH VERIO test strip USE AS INSTRUCTED FOUR TIMES DAILY TESTING DX E11.65   oxyCODONE (OXYCONTIN) 40 mg 12  hr tablet Take one tablet by mouth two times daily for chronic pain   rosuvastatin (CRESTOR) 5 MG tablet Take one tablet by mouth every Monday, Wednesday and Friday   spironolactone (ALDACTONE) 25 MG tablet TAKE 1 TABLET (25 MG TOTAL) BY MOUTH DAILY.   SYMBICORT 160-4.5 MCG/ACT inhaler TAKE 2 PUFFS BY MOUTH TWICE A DAY   triamterene-hydrochlorothiazide (MAXZIDE) 75-50 MG tablet Take 1 tablet by mouth daily.   UNABLE TO FIND Sharps container 6Y or PRN   No facility-administered encounter medications on file as of 05/12/2021.    Self-harm and/or Suicidal Behaviors Risk  Assessment Self-harm risk factors: no Patient endorses recent self injurious thoughts and/or behaviors: No   Suicide ideations: No plan to harm self or others   Danger to Others Risk Assessment Danger to others risk factors: no Patient endorses recent thoughts of harming others: No    Substance Use Assessment Patient recently consumed alcohol: No  Patient recently used drugs: No  Patient is concerned about dependence or abuse of substances: No    Goals, Interventions and Follow-up Plan Goals: Increase healthy adjustment to current life circumstances Interventions: Mindfulness or Relaxation Training   Summary of [name] is a 61 yr old woman present for follow up. Engaged Patient in discussion about his current mood and symptoms that she is current experiencing.  Facilitated a discussion on her schedule and how he was able to manage.  Discussed her progress and regression. Assisted Patient with verbally listing things that are going well.     Follow-up Plan:  Biweekly vbh session Angela South, LCSW

## 2021-05-19 ENCOUNTER — Other Ambulatory Visit: Payer: Self-pay

## 2021-05-19 ENCOUNTER — Ambulatory Visit (INDEPENDENT_AMBULATORY_CARE_PROVIDER_SITE_OTHER): Payer: Commercial Managed Care - HMO | Admitting: Licensed Clinical Social Worker

## 2021-05-19 DIAGNOSIS — F322 Major depressive disorder, single episode, severe without psychotic features: Secondary | ICD-10-CM

## 2021-05-20 ENCOUNTER — Ambulatory Visit (INDEPENDENT_AMBULATORY_CARE_PROVIDER_SITE_OTHER): Payer: Medicare Other | Admitting: Podiatry

## 2021-05-20 ENCOUNTER — Encounter: Payer: Self-pay | Admitting: Podiatry

## 2021-05-20 ENCOUNTER — Other Ambulatory Visit: Payer: Self-pay

## 2021-05-20 DIAGNOSIS — M79675 Pain in left toe(s): Secondary | ICD-10-CM

## 2021-05-20 DIAGNOSIS — Q828 Other specified congenital malformations of skin: Secondary | ICD-10-CM | POA: Diagnosis not present

## 2021-05-20 DIAGNOSIS — M79674 Pain in right toe(s): Secondary | ICD-10-CM

## 2021-05-20 DIAGNOSIS — E1142 Type 2 diabetes mellitus with diabetic polyneuropathy: Secondary | ICD-10-CM

## 2021-05-20 DIAGNOSIS — B351 Tinea unguium: Secondary | ICD-10-CM | POA: Diagnosis not present

## 2021-05-24 ENCOUNTER — Encounter: Payer: Self-pay | Admitting: Internal Medicine

## 2021-05-24 ENCOUNTER — Ambulatory Visit: Payer: Medicare Other | Admitting: Family Medicine

## 2021-05-25 ENCOUNTER — Telehealth (INDEPENDENT_AMBULATORY_CARE_PROVIDER_SITE_OTHER): Payer: Medicare Other | Admitting: Licensed Clinical Social Worker

## 2021-05-25 DIAGNOSIS — F4310 Post-traumatic stress disorder, unspecified: Secondary | ICD-10-CM

## 2021-05-25 DIAGNOSIS — F322 Major depressive disorder, single episode, severe without psychotic features: Secondary | ICD-10-CM

## 2021-05-25 NOTE — BH Specialist Note (Signed)
Virtual Behavioral Health Treatment Plan Team Note  MRN: [id number] NAME: Angela [name]  DATE: 05/25/21  Start time:   325pEnd time:  330p Total time:  5 min  Total number of Virtual [place] Treatment Team Plan encounters: 4/4  Treatment Team Attendees: Royal Piedra, LCSW & Dr. Modesta Messing  Diagnoses: No diagnosis found.  Goals, Interventions and Follow-up Plan Goals: Increase healthy adjustment to current life circumstances Interventions: Mindfulness or Relaxation Training Medication Management Recommendations: no change in recommendation Follow-up Plan: Monthly VBH visits  History of the present illness Presenting Problem/Current Symptoms: continued symptoms of DX  Psychiatric History  Depression: Yes Anxiety: No Mania: No Psychosis: No PTSD symptoms: Yes  Past Psychiatric History/Hospitalization(s): Hospitalization for psychiatric illness: No Prior Suicide Attempts: No Prior Self-injurious behavior: No  Psychosocial stressors thoughts of past trauma  Self-harm Behaviors Risk Assessment   Screenings PHQ-9 Assessments:  Depression screen [facility] 2/9 03/03/2021 03/02/2021 11/30/2020  Decreased Interest 0 3 3  Down, Depressed, Hopeless 0 3 3  PHQ - 2 Score 0 6 6  Altered sleeping 0 2 3  Tired, decreased energy 0 2 3  Change in appetite 0 2 2  Feeling bad or failure about yourself  0 2 2  Trouble concentrating 0 2 2  Moving slowly or fidgety/restless 0 2 0  Suicidal thoughts 0 0 0  PHQ-9 Score 0 18 18  Difficult doing work/chores - Very difficult Very difficult  Some recent data might be hidden   GAD-7 Assessments:  GAD 7 : Generalized Anxiety Score 11/30/2020 11/24/2020 10/10/2019  Nervous, Anxious, on Edge 1 2 0  Control/stop worrying 1 1 0  Worry too much - different things 1 1 0  Trouble relaxing 0 1 0  Restless 0 0 0  Easily annoyed or irritable 0 1 0  Afraid - awful might happen 0 1 0  Total GAD 7 Score 3 7 0  Anxiety Difficulty Not difficult at all Somewhat  difficult Not difficult at all    Past Medical History Past Medical History:  Diagnosis Date   Allergy    Phreesia 06/02/2020   Anxiety    Arthritis    Asthma    Asthma    Phreesia 04/12/2020   Breast mass    left (monitor)   CHF (congestive heart failure) ([place])    Phreesia 04/12/2020   Coronary atherosclerosis of native coronary artery    Mild nonobstructive CAD by cardiac catheterization 2008 - Dr. Terrence Dupont   Degenerative disc disease    Depression    Depression    Phreesia 04/12/2020   Diabetes mellitus without complication ([place])    Phreesia 04/12/2020   Dysrhythmia    Essential hypertension, benign    Folliculitis 08/65/7846   GERD (gastroesophageal reflux disease)    H/O hiatal hernia    Helicobacter pylori gastritis 2008   Hypercholesterolemia    Hypertension    Hyperthyroidism    s/p radiation   Left-sided epistaxis 05/15/2018   Low back pain    Migraine    Nephrolithiasis    Recurring episodes since 2004   Neuromuscular disorder ([place])    Phreesia 04/12/2020   Severe obesity (BMI >= 40) (HCC)    Sleep apnea    STOP BANG SCORE 6no cpap used   Thyroid disease    Phreesia 04/12/2020   Type 2 diabetes mellitus with diabetic neuropathy (HCC)     Vital signs: There were no vitals filed for this visit.  Allergies:  Allergies as of 05/25/2021 - Review Complete  05/20/2021  Allergen Reaction Noted   Latex Itching and Rash 06/02/2020   Morphine Other (See Comments) 07/23/2007   Morphine and related Shortness Of Breath and Swelling 06/02/2020   Other Itching, Shortness Of Breath, and Swelling 04/12/2020   Ace inhibitors Cough 04/01/2013   Aspirin Other (See Comments) 07/23/2007   Losartan Cough 04/01/2013   Tapentadol Other (See Comments) 07/18/2016   Tomato Other (See Comments) 07/13/2013   Adhesive [tape] Rash 08/30/2012    Medication History Current medications:  Outpatient Encounter Medications as of 05/25/2021  Medication Sig   albuterol (VENTOLIN  HFA) 108 (90 Base) MCG/ACT inhaler Inhale 2 puffs into the lungs every 6 (six) hours as needed for wheezing or shortness of breath.   amLODipine (NORVASC) 10 MG tablet TAKE 1 TABLET BY MOUTH EVERY DAY   betamethasone, augmented, (DIPROLENE) 0.05 % lotion Apply 1 application topically 2 (two) times daily.   blood glucose meter kit and supplies Dispense based on patient and insurance preference. Once daily testing dx. e11.9   calcipotriene (DOVONOX) 0.005 % ointment Apply 1 application topically daily.    clobetasol cream (TEMOVATE) 1.28 % Apply 1 application topically 2 (two) times daily.   cloNIDine (CATAPRES - DOSED IN MG/24 HR) 0.3 mg/24hr patch APPLY 1 PATCH TO SKIN ONCE A WEEK   cloNIDine (CATAPRES) 0.3 MG tablet TAKE 1 TABLET (0.3 MG TOTAL) BY MOUTH AT BEDTIME.   cyclobenzaprine (FLEXERIL) 10 MG tablet TAKE 1 TABLET BY MOUTH THREE TIMES A DAY   diazepam (VALIUM) 5 MG tablet Take one tablet  by mouth three times daily for muscle spasm   diazepam (VALIUM) 5 MG tablet Take one tablet by mouth three times daily for spasm   DULoxetine (CYMBALTA) 60 MG capsule TAKE 1 CAPSULE BY MOUTH TWICE A DAY   ergocalciferol (VITAMIN D2) 1.25 MG (50000 UT) capsule Take 1 capsule (50,000 Units total) by mouth once a week. One capsule once weekly   esomeprazole (NEXIUM) 40 MG capsule TAKE 1 CAPSULE BY MOUTH EVERY DAY   fluticasone (FLONASE) 50 MCG/ACT nasal spray Place 2 sprays into both nostrils daily.   furosemide (LASIX) 40 MG tablet TAKE 1 TABLET BY MOUTH EVERY DAY   gabapentin (NEURONTIN) 800 MG tablet TAKE 1 TABLET BY MOUTH 4 TIMES DAILY.   glipiZIDE (GLUCOTROL XL) 10 MG 24 hr tablet TAKE 1 TABLET BY MOUTH EVERY DAY WITH BREAKFAST   hydrOXYzine (ATARAX/VISTARIL) 10 MG tablet Take one tablet by mouth art bedtime   Insulin Lispro Prot & Lispro (HUMALOG MIX 75/25 KWIKPEN) (75-25) 100 UNIT/ML Kwikpen Inject 30 Units into the skin 2 (two) times daily with a meal.   ipratropium-albuterol (DUONEB) 0.5-2.5 (3)  MG/3ML SOLN Take 3 mLs by nebulization every 6 (six) hours as needed.   KLOR-CON M20 20 MEQ tablet TAKE 1 TABLET BY MOUTH TWICE A DAY   Lancets (ONETOUCH DELICA PLUS [id number]) MISC USE TO TEST ONCE DAILY   levothyroxine (SYNTHROID) 75 MCG tablet TAKE 1 TABLET BY MOUTH EVERY DAY   loratadine (CLARITIN) 10 MG tablet TAKE 1 TABLET BY MOUTH EVERY DAY   metFORMIN (GLUCOPHAGE) 500 MG tablet TAKE 1 TABLET BY MOUTH 2 TIMES DAILY WITH A MEAL.   metoprolol tartrate (LOPRESSOR) 25 MG tablet Take 1 tablet (25 mg total) by mouth 2 (two) times daily.   naloxone (NARCAN) nasal spray 4 mg/0.1 mL One spray into each nostril once as needed   nitroGLYCERIN (NITROSTAT) 0.4 MG SL tablet Place 1 tablet (0.4 mg total) under the tongue every 5 (  five) minutes as needed for chest pain.   ONETOUCH VERIO test strip USE AS INSTRUCTED FOUR TIMES DAILY TESTING DX E11.65   oxyCODONE (OXYCONTIN) 40 mg 12 hr tablet Take one tablet by mouth two times daily for chronic pain   rosuvastatin (CRESTOR) 5 MG tablet Take one tablet by mouth every Monday, Wednesday and Friday   spironolactone (ALDACTONE) 25 MG tablet TAKE 1 TABLET (25 MG TOTAL) BY MOUTH DAILY.   SYMBICORT 160-4.5 MCG/ACT inhaler TAKE 2 PUFFS BY MOUTH TWICE A DAY   triamterene-hydrochlorothiazide (MAXZIDE) 75-50 MG tablet Take 1 tablet by mouth daily.   UNABLE TO FIND Sharps container 6Y or PRN   zolpidem (AMBIEN) 10 MG tablet Take 1 tablet (10 mg total) by mouth at bedtime.   No facility-administered encounter medications on file as of 05/25/2021.     Scribe for Treatment Team: Lubertha South, LCSW

## 2021-05-25 NOTE — BH Specialist Note (Signed)
Angela [name]  MRN: [id number] NAME: Angela [name] Date: 05/25/21  Start time: 35 End time: 1250 Total time: 20  Type of Contact: Follow up Call  Current concerns/stressors: past trauma  Screens/[name] Tools:  Unable to provide  Functional [name]:  Sleep: fair Appetite: fair Coping ability: fair Patient taking medications as prescribed:    Current medications:  Outpatient Encounter Medications as of 05/19/2021  Medication Sig   albuterol (VENTOLIN HFA) 108 (90 Base) MCG/ACT inhaler Inhale 2 puffs into the lungs every 6 (six) hours as needed for wheezing or shortness of breath.   amLODipine (NORVASC) 10 MG tablet TAKE 1 TABLET BY MOUTH EVERY DAY   betamethasone, augmented, (DIPROLENE) 0.05 % lotion Apply 1 application topically 2 (two) times daily.   blood glucose meter kit and supplies Dispense based on patient and insurance preference. Once daily testing dx. e11.9   calcipotriene (DOVONOX) 0.005 % ointment Apply 1 application topically daily.    clobetasol cream (TEMOVATE) 3.30 % Apply 1 application topically 2 (two) times daily.   cloNIDine (CATAPRES - DOSED IN MG/24 HR) 0.3 mg/24hr patch APPLY 1 PATCH TO SKIN ONCE A WEEK   cloNIDine (CATAPRES) 0.3 MG tablet TAKE 1 TABLET (0.3 MG TOTAL) BY MOUTH AT BEDTIME.   cyclobenzaprine (FLEXERIL) 10 MG tablet TAKE 1 TABLET BY MOUTH THREE TIMES A DAY   diazepam (VALIUM) 5 MG tablet Take one tablet  by mouth three times daily for muscle spasm   diazepam (VALIUM) 5 MG tablet Take one tablet by mouth three times daily for spasm   DULoxetine (CYMBALTA) 60 MG capsule TAKE 1 CAPSULE BY MOUTH TWICE A DAY   ergocalciferol (VITAMIN D2) 1.25 MG (50000 UT) capsule Take 1 capsule (50,000 Units total) by mouth once a week. One capsule once weekly   esomeprazole (NEXIUM) 40 MG capsule TAKE 1 CAPSULE BY MOUTH EVERY DAY   fluticasone (FLONASE) 50 MCG/ACT nasal spray Place 2 sprays into both nostrils daily.    furosemide (LASIX) 40 MG tablet TAKE 1 TABLET BY MOUTH EVERY DAY   gabapentin (NEURONTIN) 800 MG tablet TAKE 1 TABLET BY MOUTH 4 TIMES DAILY.   glipiZIDE (GLUCOTROL XL) 10 MG 24 hr tablet TAKE 1 TABLET BY MOUTH EVERY DAY WITH BREAKFAST   hydrOXYzine (ATARAX/VISTARIL) 10 MG tablet Take one tablet by mouth art bedtime   Insulin Lispro Prot & Lispro (HUMALOG MIX 75/25 KWIKPEN) (75-25) 100 UNIT/ML Kwikpen Inject 30 Units into the skin 2 (two) times daily with a meal.   ipratropium-albuterol (DUONEB) 0.5-2.5 (3) MG/3ML SOLN Take 3 mLs by nebulization every 6 (six) hours as needed.   KLOR-CON M20 20 MEQ tablet TAKE 1 TABLET BY MOUTH TWICE A DAY   Lancets (ONETOUCH DELICA PLUS [id number]) MISC USE TO TEST ONCE DAILY   levothyroxine (SYNTHROID) 75 MCG tablet TAKE 1 TABLET BY MOUTH EVERY DAY   loratadine (CLARITIN) 10 MG tablet TAKE 1 TABLET BY MOUTH EVERY DAY   metFORMIN (GLUCOPHAGE) 500 MG tablet TAKE 1 TABLET BY MOUTH 2 TIMES DAILY WITH A MEAL.   metoprolol tartrate (LOPRESSOR) 25 MG tablet Take 1 tablet (25 mg total) by mouth 2 (two) times daily.   naloxone (NARCAN) nasal spray 4 mg/0.1 mL One spray into each nostril once as needed   nitroGLYCERIN (NITROSTAT) 0.4 MG SL tablet Place 1 tablet (0.4 mg total) under the tongue every 5 (five) minutes as needed for chest pain.   ONETOUCH VERIO test strip USE AS INSTRUCTED FOUR TIMES DAILY TESTING DX  E11.65   oxyCODONE (OXYCONTIN) 40 mg 12 hr tablet Take one tablet by mouth two times daily for chronic pain   rosuvastatin (CRESTOR) 5 MG tablet Take one tablet by mouth every Monday, Wednesday and Friday   spironolactone (ALDACTONE) 25 MG tablet TAKE 1 TABLET (25 MG TOTAL) BY MOUTH DAILY.   SYMBICORT 160-4.5 MCG/ACT inhaler TAKE 2 PUFFS BY MOUTH TWICE A DAY   triamterene-hydrochlorothiazide (MAXZIDE) 75-50 MG tablet Take 1 tablet by mouth daily.   UNABLE TO FIND Sharps container 6Y or PRN   No facility-administered encounter medications on file as of 05/19/2021.     Self-harm and/or Suicidal Behaviors Risk [name] Self-harm risk factors: no Patient endorses recent self injurious thoughts and/or behaviors: No   Suicide ideations: No plan to harm self or others   Danger to Others Risk [name] Danger to others risk factors: no Patient endorses recent thoughts of harming others: No    Substance Use [name] Patient recently consumed alcohol: No  Patient recently used drugs: No  Patient is concerned about dependence or abuse of substances: No    Goals, Interventions and Follow-up Plan Goals: Increase healthy adjustment to current life circumstances Interventions: Mindfulness or Relaxation Training   Summary of Angela [name] was present and open during her session.  She reports that she has been well and has "given it to God."  She denies frequent thoughts about past trauma and reports that she has conversation with her sister and daughter when she needs someone to talk to. She request infrequent calls to provide support due to the reminders that it gives.    Follow-up Plan:  Monthly [facility] visits Angela South, LCSW

## 2021-05-25 NOTE — Progress Notes (Signed)
See [place] note

## 2021-05-27 ENCOUNTER — Encounter: Payer: Self-pay | Admitting: Podiatry

## 2021-05-27 NOTE — Progress Notes (Signed)
Subjective: Angela [name] is a 61 y.o. female patient seen today for follow up of  preventative diabetic foot care and painful porokeratotic lesion(s) of the left foot and painful mycotic toenails that limit ambulation. Painful toenails interfere with ambulation. Aggravating factors include wearing enclosed shoe gear. Pain is relieved with periodic professional debridement. Painful porokeratotic lesions are aggravated when weightbearing with and without shoegear. Pain is relieved with periodic professional debridement..   New problems reported today: None.  Patient states their blood glucose was 161 mg/dl today.   PCP is Fayrene Helper, MD. Last visit was: 03/03/2021.  Allergies  Allergen Reactions   Latex Itching and Rash   Morphine Other (See Comments)    Reaction with esophagus, unable to swallow.    Morphine And Related Shortness Of Breath and Swelling   Other Itching, Shortness Of Breath and Swelling   Ace Inhibitors Cough   Aspirin Other (See Comments)    Reaction with esophagus, unable to swallow.    Losartan Cough   Tapentadol Other (See Comments)    Nausea, increased sleepiness, h/a   Tomato Other (See Comments)    Acid reflux due to acid in tomato   Adhesive [Tape] Rash    Objective: Physical Exam  General: Patient is a pleasant 61 y.o. African American female morbidly obese in NAD. AAO x 3.   Neurovascular Examination: CFT immediate b/l LE. Palpable DP/PT pulses b/l LE. Digital hair sparse b/l. Skin temperature gradient WNL b/l. No pain with calf compression b/l. No edema noted b/l. No cyanosis or clubbing noted b/l LE.  Pt has subjective symptoms of neuropathy. Protective sensation decreased with 10 gram monofilament b/l.  Dermatological:  Pedal integument with normal turgor, texture and tone b/l LE. No open wounds b/l. No interdigital macerations b/l. Toenails 1-5 b/l elongated, thickened, discolored with subungual debris. +Tenderness with dorsal palpation of  nailplates. Porokeratotic lesion(s) noted submet head 5 left foot.  Musculoskeletal:  Muscle strength 5/5 to all lower extremity muscle groups bilaterally. HAV with bunion deformity noted b/l LE. Utilizes cane for ambulation assistance.  Assessment: 1. Pain due to onychomycosis of toenails of both feet   2. Porokeratosis   3. Diabetic peripheral neuropathy associated with type 2 diabetes mellitus ([place])     Plan: Patient was evaluated and treated and all questions answered. Consent given for treatment as described below: -Mycotic toenails 1-5 bilaterally were debrided in length and girth with sterile nail nippers and dremel without incident. -Painful porokeratotic lesion(s) submet head 5 left foot pared with dremel without incident. Total number of lesions debrided=1. -Patient/POA to call should there be question/concern in the interim.  Return in about 3 months (around 08/18/2021).  Marzetta Board, DPM

## 2021-05-29 ENCOUNTER — Other Ambulatory Visit: Payer: Self-pay | Admitting: Family Medicine

## 2021-06-06 DIAGNOSIS — R0609 Other forms of dyspnea: Secondary | ICD-10-CM | POA: Diagnosis not present

## 2021-06-09 ENCOUNTER — Other Ambulatory Visit: Payer: Self-pay

## 2021-06-09 ENCOUNTER — Telehealth: Payer: Self-pay | Admitting: Family Medicine

## 2021-06-09 ENCOUNTER — Ambulatory Visit (INDEPENDENT_AMBULATORY_CARE_PROVIDER_SITE_OTHER): Payer: Medicare Other | Admitting: Family Medicine

## 2021-06-09 ENCOUNTER — Encounter: Payer: Self-pay | Admitting: Family Medicine

## 2021-06-09 VITALS — BP 130/73 | HR 90 | Ht 64.0 in | Wt 321.0 lb

## 2021-06-09 DIAGNOSIS — F324 Major depressive disorder, single episode, in partial remission: Secondary | ICD-10-CM | POA: Diagnosis not present

## 2021-06-09 DIAGNOSIS — Z79899 Other long term (current) drug therapy: Secondary | ICD-10-CM | POA: Diagnosis not present

## 2021-06-09 DIAGNOSIS — E559 Vitamin D deficiency, unspecified: Secondary | ICD-10-CM | POA: Diagnosis not present

## 2021-06-09 DIAGNOSIS — E1140 Type 2 diabetes mellitus with diabetic neuropathy, unspecified: Secondary | ICD-10-CM | POA: Diagnosis not present

## 2021-06-09 DIAGNOSIS — Z794 Long term (current) use of insulin: Secondary | ICD-10-CM | POA: Diagnosis not present

## 2021-06-09 DIAGNOSIS — G8929 Other chronic pain: Secondary | ICD-10-CM | POA: Diagnosis not present

## 2021-06-09 MED ORDER — DULOXETINE HCL 60 MG PO CPEP: 60.0000 mg | ORAL_CAPSULE | Freq: Two times a day (BID) | ORAL | 5 refills | Status: DC

## 2021-06-09 MED ORDER — OXYCODONE HCL ER 40 MG PO T12A: EXTENDED_RELEASE_TABLET | ORAL | 0 refills | Status: DC

## 2021-06-09 NOTE — Telephone Encounter (Signed)
Beth with [employer] called in regard to   oxyCODONE (OXYCONTIN) 40 mg 12 hr tablet   States that prescription was sent to [place] and pt usually uses [facility] , also [place] is not taking on any new controlled scripts so they would not be able to fill either way    Pt needs prescription sent in to [place].

## 2021-06-09 NOTE — Telephone Encounter (Signed)
[facility] spoke with April and told to disregard medication

## 2021-06-09 NOTE — Patient Instructions (Signed)
Keep March appt, call if you need me sooner  Labs today for me lipid, cmp and eGFR also give Dr Liliane Channel sheet  1 month of pain med is sent    Cymbalta will be resumed  Thanks for choosing [facility], we consider it a privelige to serve you.

## 2021-06-10 ENCOUNTER — Other Ambulatory Visit: Payer: Self-pay | Admitting: Family Medicine

## 2021-06-10 DIAGNOSIS — K760 Fatty (change of) liver, not elsewhere classified: Secondary | ICD-10-CM

## 2021-06-10 DIAGNOSIS — R7989 Other specified abnormal findings of blood chemistry: Secondary | ICD-10-CM

## 2021-06-10 LAB — CMP14+EGFR
ALT: 134 IU/L — ABNORMAL HIGH (ref 0–32)
AST: 173 IU/L — ABNORMAL HIGH (ref 0–40)
Albumin/Globulin Ratio: 1.4 (ref 1.2–2.2)
Albumin: 4.6 g/dL (ref 3.8–4.8)
Alkaline Phosphatase: 113 IU/L (ref 44–121)
BUN/Creatinine Ratio: 20 (ref 12–28)
BUN: 18 mg/dL (ref 8–27)
Bilirubin Total: 0.3 mg/dL (ref 0.0–1.2)
CO2: 26 mmol/L (ref 20–29)
Calcium: 9.7 mg/dL (ref 8.7–10.3)
Chloride: 99 mmol/L (ref 96–106)
Creatinine, Ser: 0.91 mg/dL (ref 0.57–1.00)
Globulin, Total: 3.3 g/dL (ref 1.5–4.5)
Glucose: 154 mg/dL — ABNORMAL HIGH (ref 70–99)
Potassium: 4.6 mmol/L (ref 3.5–5.2)
Sodium: 141 mmol/L (ref 134–144)
Total Protein: 7.9 g/dL (ref 6.0–8.5)
eGFR: 72 mL/min/{1.73_m2} (ref >59)

## 2021-06-10 LAB — LIPID PANEL
Chol/HDL Ratio: 2.9 ratio (ref 0.0–4.4)
Cholesterol, Total: 139 mg/dL (ref 100–199)
HDL: 48 mg/dL (ref >39)
LDL Chol Calc (NIH): 75 mg/dL (ref 0–99)
Triglycerides: 84 mg/dL (ref 0–149)
VLDL Cholesterol Cal: 16 mg/dL (ref 5–40)

## 2021-06-10 LAB — VITAMIN D 25 HYDROXY (VIT D DEFICIENCY, FRACTURES): Vit D, 25-Hydroxy: 37.3 ng/mL (ref 30.0–100.0)

## 2021-06-14 ENCOUNTER — Other Ambulatory Visit: Payer: Self-pay | Admitting: Family Medicine

## 2021-06-15 ENCOUNTER — Encounter: Payer: Self-pay | Admitting: Family Medicine

## 2021-06-15 DIAGNOSIS — Z79899 Other long term (current) drug therapy: Secondary | ICD-10-CM | POA: Insufficient documentation

## 2021-06-15 MED ORDER — ZOLPIDEM TARTRATE 10 MG PO TABS: 10.0000 mg | ORAL_TABLET | Freq: Every day | ORAL | 5 refills | Status: DC

## 2021-06-15 NOTE — Assessment & Plan Note (Signed)
Medications visually reviewed and checked as taking. Refills needed addressed Medication, cymbalta resumed. Medication not taking removed

## 2021-06-15 NOTE — Assessment & Plan Note (Signed)
°  Patient re-educated about  the importance of commitment to a  minimum of 150 minutes of exercise per week as able.  The importance of healthy food choices with portion control discussed, as well as eating regularly and within a 12 hour window most days. The need to choose "clean , green" food 50 to 75% of the time is discussed, as well as to make water the primary drink and set a goal of 64 ounces water daily.    Weight /BMI 06/09/2021 04/18/2021 03/29/2021  WEIGHT 321 lb 310 lb 3.2 oz 312 lb  HEIGHT 5\' 4"  5\' 4"  5\' 4"   BMI 55.1 kg/m2 53.25 kg/m2 53.55 kg/m2

## 2021-06-15 NOTE — Assessment & Plan Note (Signed)
cymbalta resumed and pt to continue therapy

## 2021-06-15 NOTE — Progress Notes (Signed)
Patient aware.

## 2021-06-15 NOTE — Progress Notes (Signed)
° °  Angela [name]     MRN: [id number]      DOB: 1960-08-07   HPI Angela [name] is here for medication review. All medication shei s taking is brought to the visit and is as recorded ROS No new complaints or concerns, except that she requests resuming cymbalta which helped  both with depression and pain Denies recent fever or chills. Denies sinus pressure, nasal congestion, ear pain or sore throat. Denies chest congestion, productive cough or wheezing. Denies chest pains, palpitations and leg swelling Denies abdominal pain, nausea, vomiting,diarrhea or constipation.   Denies dysuria, frequency, hesitancy or incontinence. Chronic joint pain, swelling and limitation in mobility. Denies headaches, seizures, numbness, or tingling. Denies depression, anxiety or insomnia. Denies skin break down or rash.   PE  BP 130/73    Pulse 90    Ht 5\' 4"  (1.626 m)    Wt (!) 321 lb (145.6 kg)    SpO2 97%    BMI 55.10 kg/m   Patient alert and oriented and in no cardiopulmonary distress.  HEENT: No facial asymmetry, EOMI,     Neck supple .  Chest: Clear to auscultation bilaterally.  CVS: S1, S2 no murmurs, no S3.Regular rate.     Ext: No edema  MS: Decreased ROM spine, shoulders, hips and knees.  Skin: Intact, no ulcerations or rash noted.  Psych: Good eye contact, normal affect. Memory intact not anxious or depressed appearing.  CNS: CN 2-12 intact, power,  normal throughout.no focal deficits noted.   Assessment & Plan  Medication management Medications visually reviewed and checked as taking. Refills needed addressed Medication, cymbalta resumed. Medication not taking removed  Depression cymbalta resumed and pt to continue therapy  Chronic pain 1 month of medication prescribed , registry reviewed  Morbid obesity [facility])  Patient re-educated about  the importance of commitment to a  minimum of 150 minutes of exercise per week as able.  The importance of healthy food choices with  portion control discussed, as well as eating regularly and within a 12 hour window most days. The need to choose "clean , green" food 50 to 75% of the time is discussed, as well as to make water the primary drink and set a goal of 64 ounces water daily.    Weight /BMI 06/09/2021 04/18/2021 03/29/2021  WEIGHT 321 lb 310 lb 3.2 oz 312 lb  HEIGHT 5\' 4"  5\' 4"  5\' 4"   BMI 55.1 kg/m2 53.25 kg/m2 53.55 kg/m2

## 2021-06-15 NOTE — Assessment & Plan Note (Signed)
1 month of medication prescribed , registry reviewed

## 2021-06-16 ENCOUNTER — Ambulatory Visit (INDEPENDENT_AMBULATORY_CARE_PROVIDER_SITE_OTHER): Payer: Medicare Other | Admitting: Licensed Clinical Social Worker

## 2021-06-16 ENCOUNTER — Other Ambulatory Visit: Payer: Self-pay

## 2021-06-16 DIAGNOSIS — F324 Major depressive disorder, single episode, in partial remission: Secondary | ICD-10-CM

## 2021-06-17 ENCOUNTER — Other Ambulatory Visit: Payer: Self-pay | Admitting: Family Medicine

## 2021-06-17 NOTE — Progress Notes (Signed)
error 

## 2021-06-20 ENCOUNTER — Other Ambulatory Visit: Payer: Self-pay | Admitting: Nurse Practitioner

## 2021-06-20 DIAGNOSIS — I83893 Varicose veins of bilateral lower extremities with other complications: Secondary | ICD-10-CM | POA: Diagnosis not present

## 2021-06-20 DIAGNOSIS — L280 Lichen simplex chronicus: Secondary | ICD-10-CM | POA: Diagnosis not present

## 2021-06-20 DIAGNOSIS — L400 Psoriasis vulgaris: Secondary | ICD-10-CM | POA: Diagnosis not present

## 2021-06-22 NOTE — BH Specialist Note (Signed)
Rescheduled to Feb 23 at 1230

## 2021-06-23 ENCOUNTER — Ambulatory Visit (INDEPENDENT_AMBULATORY_CARE_PROVIDER_SITE_OTHER): Payer: Medicare Other | Admitting: Nurse Practitioner

## 2021-06-23 ENCOUNTER — Encounter: Payer: Self-pay | Admitting: Nurse Practitioner

## 2021-06-23 ENCOUNTER — Other Ambulatory Visit: Payer: Self-pay

## 2021-06-23 VITALS — BP 139/75 | HR 76 | Ht 64.0 in | Wt 317.0 lb

## 2021-06-23 DIAGNOSIS — E039 Hypothyroidism, unspecified: Secondary | ICD-10-CM

## 2021-06-23 DIAGNOSIS — E559 Vitamin D deficiency, unspecified: Secondary | ICD-10-CM

## 2021-06-23 DIAGNOSIS — I10 Essential (primary) hypertension: Secondary | ICD-10-CM

## 2021-06-23 DIAGNOSIS — E1159 Type 2 diabetes mellitus with other circulatory complications: Secondary | ICD-10-CM | POA: Diagnosis not present

## 2021-06-23 DIAGNOSIS — E782 Mixed hyperlipidemia: Secondary | ICD-10-CM

## 2021-06-23 LAB — POCT GLYCOSYLATED HEMOGLOBIN (HGB A1C): HbA1c, POC (controlled diabetic range): 9.2 % — AB (ref 0.0–7.0)

## 2021-06-23 NOTE — Progress Notes (Signed)
06/23/2021, 2:57 PM                    Endocrinology follow-up note   Subjective:    [name], female    DOB: 28-Jan-1961.  [name] is being seen in follow-up in the management of currently uncontrolled symptomatic type 2 diabetes.  She also has comorbid dyslipidemia, hypertension.   PMD:  Angela Helper, MD.   Past Medical History:  Diagnosis Date   Allergy    Phreesia 06/02/2020   Anxiety    Arthritis    Asthma    Asthma    Phreesia 04/12/2020   Breast mass    left (monitor)   CHF (congestive heart failure) ([employer])    Phreesia 04/12/2020   Coronary atherosclerosis of native coronary artery    Mild nonobstructive CAD by cardiac catheterization 2008 - Dr. Terrence [name]   Degenerative disc disease    Depression    Depression    Phreesia 04/12/2020   Diabetes mellitus without complication ([place])    Phreesia 04/12/2020   Dysrhythmia    Essential hypertension, benign    Folliculitis 88/50/2774   GERD (gastroesophageal reflux disease)    H/O hiatal hernia    Helicobacter pylori gastritis 2008   Hypercholesterolemia    Hypertension    Hyperthyroidism    s/p radiation   Left-sided epistaxis 05/15/2018   Low back pain    Migraine    Nephrolithiasis    Recurring episodes since 2004   Neuromuscular disorder ([place])    Phreesia 04/12/2020   Severe obesity (BMI >= 40) (HCC)    Sleep apnea    STOP BANG SCORE 6no cpap used   Thyroid disease    Phreesia 04/12/2020   Type 2 diabetes mellitus with diabetic neuropathy ([place])     Past Surgical History:  Procedure Laterality Date   [place]  2000 [facility]   INT HEMORRHOIDS/FISSURE   COLONOSCOPY  2003 [facility], CHANGE IN BOWEL HABITS   INT HEMORRHOIDS   COLONOSCOPY  2006 [facility]   INT HEMORRHOIDS    COLONOSCOPY  2007 [facility]   INT HEMORRHOIDS   CYSTOSCOPY WITH STENT PLACEMENT Left 07/25/2012   Procedure: CYSTOSCOPY, left retograde pyelogram WITH left ureteral  STENT PLACEMENT;  Surgeon: Angela Jabs, MD;  Location: [facility];  Service: Urology;  Laterality: Left;   CYSTOSCOPY/RETROGRADE/URETEROSCOPY  12/04/2011   Procedure: CYSTOSCOPY/RETROGRADE/URETEROSCOPY;  Surgeon: Angela Hazard, MD;  Location: [facility];  Service: Urology;  Laterality: Right;   CYSTOSCOPY/RETROGRADE/URETEROSCOPY/STONE EXTRACTION WITH BASKET Left 09/03/2012   Procedure: CYSTOSCOPY/RETROGRADE pyelogram/digital URETEROSCOPY/STONE EXTRACTION WITH BASKET, left stent removal;  Surgeon: Angela Hazard, MD;  Location: [facility];  Service: Urology;  Laterality: Left;   ESOPHAGOGASTRODUODENOSCOPY  2008   JOI:NOMVE hiatal hernia./Normal esophagus without evidence of Barrett's mass, stricture, erosion or ulceration./Normal duodenal bulb and second portion of the duodenum./Diffuse erythema in the body and the antrum with occasional erosion.  Biopsies obtained via cold forceps to evaluate for H. pylori gastritis   FRACTURE SURGERY  1999   right clavicle   HOLMIUM LASER APPLICATION Left 10/30/945   Procedure: HOLMIUM LASER APPLICATION;  Surgeon: Angela Hazard, MD;  Location: [facility];  Service: Urology;  Laterality: Left;   KNEE ARTHROSCOPY  10/04/2010,    right knee arthroscopy, dr Angela [name]   LEFT HEART CATH AND CORONARY ANGIOGRAPHY N/A 05/08/2018   Procedure: LEFT HEART CATH AND CORONARY ANGIOGRAPHY;  Surgeon: Angela Sine, MD;  Location: [place] [facility];  Service: Cardiovascular;  Laterality: N/A;   left knee arthroscopic surgery  07-13-97   Left salphingectomy secondary to ectopic pregnancy  [place] CATH N/A 07/07/2019   Procedure: RIGHT HEART CATH;  Surgeon: Angela Man, MD;  Location: [place] [facility];  Service: Cardiovascular;  Laterality: N/A;   rotary cuff  Right  05/14/2014   [facility]   SPINE SURGERY N/A    Phreesia 06/02/2020   UPPER GASTROINTESTINAL ENDOSCOPY  Jul 14, 2006 abd pain   H. pylori gastritis   UPPER GASTROINTESTINAL ENDOSCOPY  1996 [place]   PUD    Social History   Socioeconomic History   Marital status: Single    Spouse name: Not on file   Number of children: 1   Years of education: 12   Highest education level: 12th grade  Occupational History   Occupation: Disable   Tobacco Use   Smoking status: Never   Smokeless tobacco: Never  Vaping Use   Vaping Use: Never used  Substance and Sexual Activity   Alcohol use: No   Drug use: No   Sexual activity: Not Currently    Partners: Male    Birth control/protection: Surgical    Comment: hyst  Other Topics Concern   Not on file  Social History Narrative   Lives alone    Social Determinants of Health   Financial Resource Strain: Low Risk    Difficulty of Paying Living Expenses: Not very hard  Food Insecurity: No Food Insecurity   Worried About Charity fundraiser in the Last Year: Never true   Ran Out of Food in the Last Year: Never true  Transportation Needs: No Transportation Needs   Lack of Transportation (Medical): No   Lack of Transportation (Non-Medical): No  Physical Activity: Sufficiently Active   Days of Exercise per Week: 5 days   Minutes of Exercise per Session: 30 min  Stress: Not on file  Social Connections: Socially Isolated   Frequency of Communication with Friends and Family: More than three times a week   Frequency of Social Gatherings with Friends and Family: More than three times a week   Attends Religious Services: Never   Marine scientist or Organizations: No   Attends Music therapist: Never   Marital Status: Divorced    Family History  Problem Relation Age of Onset   Heart disease Mother    Hypertension Mother    Cancer Mother        Cervical    Asthma Mother    Heart attack Father    Cancer Father        Prostate    Colon cancer Father        DECEASED AGE 07-14-2059   Colon polyps Neg Hx     Outpatient Encounter Medications as of 06/23/2021  Medication Sig   albuterol (VENTOLIN HFA) 108 (90 Base) MCG/ACT inhaler Inhale 2 puffs into the lungs every 6 (six) hours as needed for wheezing or shortness of breath.   amLODipine (NORVASC) 10 MG tablet TAKE 1 TABLET BY MOUTH EVERY DAY   B-D UF III MINI PEN NEEDLES 31G X 5 MM MISC 2 (two)  times daily. as directed   betamethasone, augmented, (DIPROLENE) 0.05 % lotion Apply 1 application topically 2 (two) times daily. (Patient not taking: Reported on 06/09/2021)   blood glucose meter kit and supplies Dispense based on patient and insurance preference. Once daily testing dx. e11.9   calcipotriene (DOVONOX) 0.005 % ointment Apply 1 application topically daily.    clobetasol cream (TEMOVATE) 0.05 % Apply 1 application topically 2 (two) times daily. (Patient not taking: Reported on 06/09/2021)   cloNIDine (CATAPRES - DOSED IN MG/24 HR) 0.3 mg/24hr patch APPLY 1 PATCH TO SKIN ONCE A WEEK   cyclobenzaprine (FLEXERIL) 10 MG tablet TAKE 1 TABLET BY MOUTH THREE TIMES A DAY   diazepam (VALIUM) 5 MG tablet Take one tablet by mouth three times daily for spasm   DULoxetine (CYMBALTA) 60 MG capsule Take 1 capsule (60 mg total) by mouth 2 (two) times daily.   ergocalciferol (VITAMIN D2) 1.25 MG (50000 UT) capsule Take 1 capsule (50,000 Units total) by mouth once a week. One capsule once weekly   esomeprazole (NEXIUM) 40 MG capsule TAKE 1 CAPSULE BY MOUTH EVERY DAY   fluticasone (FLONASE) 50 MCG/ACT nasal spray Place 2 sprays into both nostrils daily.   furosemide (LASIX) 40 MG tablet TAKE 1 TABLET BY MOUTH EVERY DAY   gabapentin (NEURONTIN) 800 MG tablet TAKE 1 TABLET BY MOUTH 4 TIMES DAILY.   glipiZIDE (GLUCOTROL XL) 10 MG 24 hr tablet TAKE 1 TABLET BY MOUTH EVERY DAY WITH BREAKFAST   hydrOXYzine (ATARAX/VISTARIL) 10 MG tablet Take one tablet by mouth art bedtime   Insulin Lispro Prot &  Lispro (HUMALOG MIX 75/25 KWIKPEN) (75-25) 100 UNIT/ML Kwikpen Inject 30 Units into the skin 2 (two) times daily with a meal.   ipratropium-albuterol (DUONEB) 0.5-2.5 (3) MG/3ML SOLN Take 3 mLs by nebulization every 6 (six) hours as needed.   KLOR-CON M20 20 MEQ tablet TAKE 1 TABLET BY MOUTH TWICE A DAY   Lancets (ONETOUCH DELICA PLUS LANCET33G) MISC USE TO TEST ONCE DAILY   levothyroxine (SYNTHROID) 75 MCG tablet TAKE 1 TABLET BY MOUTH EVERY DAY   loratadine (CLARITIN) 10 MG tablet TAKE 1 TABLET BY MOUTH EVERY DAY   metFORMIN (GLUCOPHAGE) 500 MG tablet TAKE 1 TABLET BY MOUTH 2 TIMES DAILY WITH A MEAL.   metoprolol tartrate (LOPRESSOR) 25 MG tablet Take 1 tablet (25 mg total) by mouth 2 (two) times daily.   naloxone (NARCAN) nasal spray 4 mg/0.1 mL One spray into each nostril once as needed   nitroGLYCERIN (NITROSTAT) 0.4 MG SL tablet Place 1 tablet (0.4 mg total) under the tongue every 5 (five) minutes as needed for chest pain.   ONETOUCH VERIO test strip USE AS INSTRUCTED FOUR TIMES DAILY TESTING DX E11.65   oxyCODONE (OXYCONTIN) 40 mg 12 hr tablet Take 40 mg by mouth 2 (two) times daily.   oxyCODONE (OXYCONTIN) 40 mg 12 hr tablet Take one tablet by mouth two times daily for pain   oxyCODONE (OXYCONTIN) 40 mg 12 hr tablet Take one tablet by mouth two times daily for pain   spironolactone (ALDACTONE) 25 MG tablet TAKE 1 TABLET (25 MG TOTAL) BY MOUTH DAILY.   SYMBICORT 160-4.5 MCG/ACT inhaler TAKE 2 PUFFS BY MOUTH TWICE A DAY   zolpidem (AMBIEN) 10 MG tablet Take 1 tablet (10 mg total) by mouth at bedtime.   zolpidem (AMBIEN) 10 MG tablet Take 1 tablet (10 mg total) by mouth at bedtime.   No facility-administered encounter medications on file as of 06/23/2021.  ALLERGIES: Allergies  Allergen Reactions   Latex Itching and Rash   Morphine Other (See Comments)    Reaction with esophagus, unable to swallow.    Morphine And Related Shortness Of Breath and Swelling   Other Itching, Shortness  Of Breath and Swelling   Ace Inhibitors Cough   Aspirin Other (See Comments)    Reaction with esophagus, unable to swallow.    Losartan Cough   Tapentadol Other (See Comments)    Nausea, increased sleepiness, h/a   Tomato Other (See Comments)    Acid reflux due to acid in tomato   Adhesive [Tape] Rash    VACCINATION STATUS: Immunization History  Administered Date(s) Administered   Influenza Split 03/30/2011   Influenza Whole 03/21/2005, 02/09/2006   Influenza,inj,Quad PF,6+ Mos 02/11/2014, 07/27/2015, 03/09/2016, 01/29/2017, 02/07/2018, 01/30/2019, 02/11/2020, 03/03/2021   Influenza-Unspecified 01/29/2017   PFIZER(Purple Top)SARS-COV-2 Vaccination 07/22/2019, 08/12/2019, 03/05/2020, 11/19/2020   Pneumococcal Polysaccharide-23 04/17/2006, 09/14/2015   Td 04/17/2006    Diabetes She presents for her follow-up diabetic visit. She has type 2 diabetes mellitus. Her disease course has been stable. There are no hypoglycemic associated symptoms. Pertinent negatives for hypoglycemia include no confusion, headaches, pallor or seizures. Associated symptoms include fatigue. Pertinent negatives for diabetes include no blurred vision, no chest pain, no polydipsia, no polyphagia, no polyuria and no weight loss. There are no hypoglycemic complications. Symptoms are stable. Diabetic complications include heart disease, nephropathy and peripheral neuropathy. Risk factors for coronary artery disease include dyslipidemia, diabetes mellitus, family history, hypertension, obesity, sedentary lifestyle, tobacco exposure and post-menopausal. Current diabetic treatment includes insulin injections and oral agent (dual therapy). She is compliant with treatment most of the time. Her weight is fluctuating minimally. She is following a generally healthy diet. When asked about meal planning, she reported none. She has had a previous visit with a dietitian. She never participates in exercise. Her home blood glucose trend is  decreasing steadily. Her overall blood glucose range is 180-200 mg/dl. (She presents today with her meter and logs showing slightly above target fasting and postprandial glycemic profile.  Her POCT A1c today is 9.2%, essentially unchanged from previous visit.  She admits to late night snacking but has recently switched to healthier snacks with veggies instead.  She also has been avoiding fruits as her daughter recommended she avoid them due to high sugar content.  Analysis of her meter shows 7-day average of 187, 14-day average of 180, 30-day average of1 76, 90-day average of 181.) An ACE inhibitor/angiotensin II receptor blocker is contraindicated. She does not see a podiatrist.Eye exam is not current.  Hyperlipidemia This is a chronic problem. The current episode started more than 1 year ago. The problem is controlled. Recent lipid tests were reviewed and are normal. Exacerbating diseases include chronic renal disease, diabetes, hypothyroidism and obesity. Factors aggravating her hyperlipidemia include beta blockers and thiazides. Pertinent negatives include no chest pain, myalgias or shortness of breath. Current antihyperlipidemic treatment includes statins. The current treatment provides mild improvement of lipids. Compliance problems include adherence to diet and adherence to exercise.  Risk factors for coronary artery disease include diabetes mellitus, dyslipidemia, hypertension, obesity and a sedentary lifestyle.  Hypertension This is a chronic problem. The current episode started more than 1 year ago. The problem has been gradually improving since onset. The problem is controlled. Associated symptoms include peripheral edema. Pertinent negatives include no blurred vision, chest pain, headaches, palpitations or shortness of breath. Agents associated with hypertension include thyroid hormones. Risk factors for coronary artery disease  include family history, dyslipidemia, diabetes mellitus, obesity,  sedentary lifestyle and post-menopausal state. Past treatments include calcium channel blockers, diuretics and beta blockers. The current treatment provides moderate improvement. Compliance problems include diet and exercise.  Hypertensive end-organ damage includes kidney disease, CAD/MI and heart failure. Identifiable causes of hypertension include chronic renal disease and sleep apnea.   Review of systems  Constitutional: + minimally fluctuating body weight,  current Body mass index is 54.41 kg/m. , + fatigue, no subjective hyperthermia, no subjective hypothermia Eyes: no blurry vision, no xerophthalmia ENT: no sore throat, no nodules palpated in throat, no dysphagia/odynophagia, no hoarseness Cardiovascular: no chest pain, no shortness of breath, no palpitations, no leg swelling Respiratory: no cough, no SOB Gastrointestinal: no nausea/vomiting/diarrhea Musculoskeletal: no muscle/joint aches, walks with cane d/t hx of falls Skin: no rashes, no hyperemia Neurological: no tremors, no numbness, no tingling, no dizziness Psychiatric: no depression, no anxiety,    Objective:    BP 139/75    Pulse 76    Ht $R'5\' 4"'Rg$  (1.626 m)    Wt (!) 317 lb (143.8 kg)    BMI 54.41 kg/m   Wt Readings from Last 3 Encounters:  06/23/21 (!) 317 lb (143.8 kg)  06/09/21 (!) 321 lb (145.6 kg)  04/18/21 (!) 310 lb 3.2 oz (140.7 kg)    BP Readings from Last 3 Encounters:  06/23/21 139/75  06/09/21 130/73  04/18/21 (!) 178/98     Physical Exam- Limited  Constitutional:  Body mass index is 54.41 kg/m. , not in acute distress, normal state of mind Eyes:  EOMI, no exophthalmos Neck: Supple Cardiovascular: RRR, no murmurs, rubs, or gallops, no edema Respiratory: Adequate breathing efforts, no crackles, rales, rhonchi, or wheezing Musculoskeletal: no gross deformities, strength intact in all four extremities, no gross restriction of joint movements, walks with cane due to disequilibrium and hx of falls Skin:   no rashes, no hyperemia Neurological: no tremor with outstretched hands   CMP ( most recent) CMP     Component Value Date/Time   NA 141 06/09/2021 1219   K 4.6 06/09/2021 1219   CL 99 06/09/2021 1219   CO2 26 06/09/2021 1219   GLUCOSE 154 (H) 06/09/2021 1219   GLUCOSE 316 (H) 02/11/2020 1111   BUN 18 06/09/2021 1219   CREATININE 0.91 06/09/2021 1219   CREATININE 0.91 02/11/2020 1111   CALCIUM 9.7 06/09/2021 1219   PROT 7.9 06/09/2021 1219   ALBUMIN 4.6 06/09/2021 1219   AST 173 (H) 06/09/2021 1219   ALT 134 (H) 06/09/2021 1219   ALKPHOS 113 06/09/2021 1219   BILITOT 0.3 06/09/2021 1219   GFRNONAA 70 05/06/2020 1201   GFRNONAA 69 02/11/2020 1111   GFRAA 80 05/06/2020 1201   GFRAA 80 02/11/2020 1111    Diabetic Labs (most recent): Lab Results  Component Value Date   HGBA1C 9.2 (A) 06/23/2021   HGBA1C 9.1 (A) 03/23/2021   HGBA1C 8.9 (A) 12/21/2020     Lipid Panel ( most recent) Lipid Panel     Component Value Date/Time   CHOL 139 06/09/2021 1219   TRIG 84 06/09/2021 1219   HDL 48 06/09/2021 1219   CHOLHDL 2.9 06/09/2021 1219   CHOLHDL 4.0 11/10/2019 1303   VLDL 16 05/06/2018 0335   LDLCALC 75 06/09/2021 1219   LDLCALC 97 11/10/2019 1303      Assessment & Plan:   1) Uncontrolled type 2 diabetes mellitus with hyperglycemia (HCC)  - Angela [name] has currently uncontrolled symptomatic type 2 DM since  61 years of age.  She presents today with her meter and logs showing slightly above target fasting and postprandial glycemic profile.  Her POCT A1c today is 9.2%, essentially unchanged from previous visit.  She admits to late night snacking but has recently switched to healthier snacks with veggies instead.  She also has been avoiding fruits as her daughter recommended she avoid them due to high sugar content.  Analysis of her meter shows 7-day average of 187, 14-day average of 180, 30-day average of1 76, 90-day average of 181.   Recent labs reviewed.  -her  diabetes is complicated by coronary artery disease, CHF, obesity/sedentary life and she remains at a high risk for more acute and chronic complications which include CAD, CVA, CKD, retinopathy, and neuropathy. These are all discussed in detail with her.  The following Lifestyle Medicine recommendations according to [facility]) were discussed and offered to patient and she agrees to start the journey:  A. Whole Foods, Plant-based plate comprising of fruits and vegetables, plant-based proteins, whole-grain carbohydrates was discussed in detail with the patient.   A list for source of those nutrients were also provided to the patient.  Patient will use only water or unsweetened tea for hydration. B.  The need to stay away from risky substances including alcohol, smoking; obtaining 7 to 9 hours of restorative sleep, at least 150 minutes of moderate intensity exercise weekly, the importance of healthy social connections,  and stress reduction techniques were discussed. C.  A full color page of  Calorie density of various food groups per pound showing examples of each food groups was provided to the patient.  - Nutritional counseling repeated at each appointment due to patients tendency to fall back in to old habits.  - The patient admits there is a room for improvement in their diet and drink choices. -  Suggestion is made for the patient to avoid simple carbohydrates from their diet including Cakes, Sweet Desserts / Pastries, Ice Cream, Soda (diet and regular), Sweet Tea, Candies, Chips, Cookies, Sweet Pastries, Store Bought Juices, Alcohol in Excess of 1-2 drinks a day, Artificial Sweeteners, Coffee Creamer, and "Sugar-free" Products. This will help patient to have stable blood glucose profile and potentially avoid unintended weight gain.   - I encouraged the patient to switch to unprocessed or minimally processed complex starch and increased protein intake (animal or plant  source), fruits, and vegetables.   - Patient is advised to stick to a routine mealtimes to eat 3 meals a day and avoid unnecessary snacks (to snack only to correct hypoglycemia).  - I have approached her with the following individualized plan to manage diabetes and patient agrees:   -Given her presentation with chronic glycemic burden, she will likely require multiple daily injections of insulin in order for her to achieve control of diabetes to target.  -She is advised to continue Humalog 75/25 30 units SQ twice daily with breakfast and supper.  She can continue her Metformin 500 mg po twice daily and Glipizide 10 mg XL daily with breakfast.  We discussed eating more fruits and veggies and eating less processed foods.  -She is encouraged to consistently monitor glucose 3 times daily, before injecting insulin and before bed, and to call the clinic if she has readings less than 70 or greater than 300 for 3 tests in a row.  - she is warned not to take insulin without proper monitoring per orders.  2) Blood Pressure /Hypertension: Her blood pressure  is controlled to target.  She is advised to continue Norvasc 10 mg po daily, Clonidine 0.3 mg patch once weekly, Clonidine 0.3 mg po daily at bedtime, Lasix 40 mg po daily, Spironolactone 25 mg po daily, and Metoprolol 25 mg po twice daily.  3) Lipids/Hyperlipidemia:  Her most recent lipid panel from 06/09/21 shows controlled LDL at 75.  She is advised to continue Crestor 5 mg po every other day at bedtime.    4)  Weight/Diet:  Her Body mass index is 54.41 kg/m. - clearly complicating her diabetes care.  She is a candidate for modest weight loss.  She has multiple comorbid situations complicating her chance of bariatric surgery.  I discussed with her the fact that loss of 5 - 10% of her  current body weight will have the most impact on her diabetes management.  CDE Consult will be initiated . Exercise, and detailed carbohydrates information provided  -   detailed on discharge instructions.  5) Hypothyroidism:   -There are no recent TFTs to review.   She is advised to continue Levothyroxine 75 mcg po daily before breakfast.    - We discussed about the correct intake of her thyroid hormone, on empty stomach at fasting, with water, separated by at least 30 minutes from breakfast and other medications,  and separated by more than 4 hours from calcium, iron, multivitamins, acid reflux medications (PPIs). -Patient is made aware of the fact that thyroid hormone replacement is needed for life, dose to be adjusted by periodic monitoring of thyroid function tests.  6) Chronic Care/Health Maintenance: -she is not on ACE/ARB (intolerant to both) is on statin medications and is encouraged to initiate and continue to follow up with Ophthalmology, Dentist,  Podiatrist at least yearly or according to recommendations, and advised to  stay away from smoking. I have recommended yearly flu vaccine and pneumonia vaccine at least every 5 years; moderate intensity exercise for up to 150 minutes weekly; and  sleep for at least 7 hours a day.  - she is advised to maintain close follow up with Angela Helper, MD for primary care needs, as well as her other providers for optimal and coordinated care.     I spent 44 minutes in the care of the patient today including review of labs from [place], Lipids, Thyroid Function, Hematology (current and previous including abstractions from other facilities); face-to-face time discussing  her blood glucose readings/logs, discussing hypoglycemia and hyperglycemia episodes and symptoms, medications doses, her options of short and long term treatment based on the latest standards of care / guidelines;  discussion about incorporating lifestyle medicine;  and documenting the encounter.    Please refer to Patient Instructions for Blood Glucose Monitoring and Insulin/Medications Dosing Guide"  in media tab for additional information. Please   also refer to " Patient Self Inventory" in the Media  tab for reviewed elements of pertinent patient history.  [place] participated in the discussions, expressed understanding, and voiced agreement with the above plans.  All questions were answered to her satisfaction. she is encouraged to contact clinic should she have any questions or concerns prior to her return visit.  Follow up plan: - Return in about 3 months (around 09/20/2021) for Diabetes F/U with A1c in office, No previsit labs, Bring meter and logs.    Rayetta Pigg, [facility] [address] [place], [place] [address] Phone: [phone] Fax: [phone]   06/23/2021, 2:57 PM

## 2021-06-23 NOTE — Patient Instructions (Signed)

## 2021-06-28 ENCOUNTER — Telehealth: Payer: Self-pay | Admitting: Family Medicine

## 2021-06-28 MED ORDER — SYMBICORT 160-4.5 MCG/ACT IN AERO
2.0000 | INHALATION_SPRAY | Freq: Two times a day (BID) | RESPIRATORY_TRACT | 12 refills | Status: DC
Start: 2021-06-28 — End: 2022-02-28

## 2021-06-28 NOTE — Telephone Encounter (Signed)
Buders/ formaer not covered. I have left paperwork in your box and am sending in symbicort name brand, if his does not work please help me to work through this, thanks

## 2021-06-28 NOTE — Telephone Encounter (Signed)
Noted. Should be covered based on paperwork

## 2021-07-01 ENCOUNTER — Encounter: Payer: Self-pay | Admitting: Family Medicine

## 2021-07-01 ENCOUNTER — Ambulatory Visit (INDEPENDENT_AMBULATORY_CARE_PROVIDER_SITE_OTHER): Payer: Medicare Other | Admitting: Family Medicine

## 2021-07-01 ENCOUNTER — Other Ambulatory Visit: Payer: Self-pay | Admitting: Family Medicine

## 2021-07-01 ENCOUNTER — Other Ambulatory Visit: Payer: Self-pay

## 2021-07-01 VITALS — BP 133/77 | HR 78 | Ht 64.0 in | Wt 320.0 lb

## 2021-07-01 DIAGNOSIS — G8929 Other chronic pain: Secondary | ICD-10-CM | POA: Diagnosis not present

## 2021-07-01 DIAGNOSIS — I5032 Chronic diastolic (congestive) heart failure: Secondary | ICD-10-CM | POA: Diagnosis not present

## 2021-07-01 DIAGNOSIS — I10 Essential (primary) hypertension: Secondary | ICD-10-CM | POA: Diagnosis not present

## 2021-07-01 DIAGNOSIS — Z0289 Encounter for other administrative examinations: Secondary | ICD-10-CM

## 2021-07-01 DIAGNOSIS — E1140 Type 2 diabetes mellitus with diabetic neuropathy, unspecified: Secondary | ICD-10-CM

## 2021-07-01 DIAGNOSIS — M5116 Intervertebral disc disorders with radiculopathy, lumbar region: Secondary | ICD-10-CM | POA: Diagnosis not present

## 2021-07-01 DIAGNOSIS — J4530 Mild persistent asthma, uncomplicated: Secondary | ICD-10-CM | POA: Diagnosis not present

## 2021-07-01 DIAGNOSIS — Z794 Long term (current) use of insulin: Secondary | ICD-10-CM

## 2021-07-01 DIAGNOSIS — R52 Pain, unspecified: Secondary | ICD-10-CM | POA: Diagnosis not present

## 2021-07-01 DIAGNOSIS — F322 Major depressive disorder, single episode, severe without psychotic features: Secondary | ICD-10-CM

## 2021-07-01 MED ORDER — IPRATROPIUM-ALBUTEROL 0.5-2.5 (3) MG/3ML IN SOLN: 3.0000 mL | Freq: Four times a day (QID) | RESPIRATORY_TRACT | 3 refills | Status: DC | PRN

## 2021-07-01 MED ORDER — KETOROLAC TROMETHAMINE 60 MG/2ML IM SOLN
60.0000 mg | Freq: Once | INTRAMUSCULAR | Status: AC
  Administered 2021-07-01: 60 mg via INTRAMUSCULAR

## 2021-07-01 NOTE — Patient Instructions (Addendum)
F/U in 12 weeks, call if you need me before ? ?UDS today, needs a hat ? ?Hepatic panel today ? ?Nurse please refill neb solution ( [facility]) ? ?Please get covid booster and shingrix vaccines ? ?Thanks for choosing [facility], we consider it a privelige to serve you. ? ?

## 2021-07-02 LAB — HEPATIC FUNCTION PANEL
ALT: 28 IU/L (ref 0–32)
AST: 22 IU/L (ref 0–40)
Albumin: 4.9 g/dL — ABNORMAL HIGH (ref 3.8–4.8)
Alkaline Phosphatase: 86 IU/L (ref 44–121)
Bilirubin Total: <0.2 mg/dL (ref 0.0–1.2)
Bilirubin, Direct: <0.1 mg/dL (ref 0.00–0.40)
Total Protein: 7.9 g/dL (ref 6.0–8.5)

## 2021-07-04 DIAGNOSIS — R0609 Other forms of dyspnea: Secondary | ICD-10-CM | POA: Diagnosis not present

## 2021-07-07 ENCOUNTER — Other Ambulatory Visit: Payer: Self-pay | Admitting: Family Medicine

## 2021-07-07 DIAGNOSIS — M62830 Muscle spasm of back: Secondary | ICD-10-CM

## 2021-07-07 DIAGNOSIS — I10 Essential (primary) hypertension: Secondary | ICD-10-CM

## 2021-07-07 LAB — TOXASSURE SELECT 13 (MW), URINE

## 2021-07-09 ENCOUNTER — Other Ambulatory Visit: Payer: Self-pay | Admitting: Family Medicine

## 2021-07-11 ENCOUNTER — Other Ambulatory Visit: Payer: Self-pay | Admitting: Family Medicine

## 2021-07-11 ENCOUNTER — Encounter: Payer: Self-pay | Admitting: Family Medicine

## 2021-07-11 ENCOUNTER — Telehealth: Payer: Self-pay

## 2021-07-11 DIAGNOSIS — Z0289 Encounter for other administrative examinations: Secondary | ICD-10-CM | POA: Insufficient documentation

## 2021-07-11 MED ORDER — OXYCODONE HCL ER 40 MG PO T12A: 40.0000 mg | EXTENDED_RELEASE_TABLET | Freq: Two times a day (BID) | ORAL | 0 refills | Status: DC

## 2021-07-11 MED ORDER — OXYCODONE HCL ER 40 MG PO T12A: EXTENDED_RELEASE_TABLET | ORAL | 0 refills | Status: DC

## 2021-07-11 MED ORDER — OXYCODONE HCL ER 40 MG PO T12A
40.0000 mg | EXTENDED_RELEASE_TABLET | Freq: Two times a day (BID) | ORAL | 0 refills | Status: DC
Start: 2021-08-10 — End: 2021-11-28

## 2021-07-11 NOTE — Telephone Encounter (Signed)
Please advise 

## 2021-07-11 NOTE — Assessment & Plan Note (Signed)
Reports needing neb solution refilled , will have this done ?

## 2021-07-11 NOTE — Progress Notes (Addendum)
? ?Angela [name]     MRN: [id number]      DOB: March 19, 1961 ? ? ?HPI ?Angela [name] is here for follow up and re-evaluation of chronic medical conditions, medication management and review of any available recent lab and radiology data.  ?Preventive health is updated, specifically  Cancer screening and Immunization.   ?Questions or concerns regarding consultations or procedures which the PT has had in the interim are  addressed. ?The PT denies any adverse reactions to current medications since the last visit.  ?There are no new concerns.  ?There are no specific complaints  ? ?ROS ?Denies recent fever or chills. ?Denies sinus pressure, nasal congestion, ear pain or sore throat. ?Denies chest congestion, productive cough or wheezing. ?Denies chest pains, palpitations and leg swelling ?Denies abdominal pain, nausea, vomiting,diarrhea or constipation.   ?Denies dysuria, frequency, hesitancy or incontinence. ?Chronic joint pain, swelling and limitation in mobility. ?Denies headaches, seizures, numbness, or tingling. ?Denies uncontrolled depression, anxiety or insomnia. ?Denies skin break down or rash. ? ? ?PE ? ?BP 133/77   Pulse 78   Ht '5\' 4"'$  (1.626 m)   Wt (!) 320 lb (145.2 kg)   SpO2 99%   BMI 54.93 kg/m?  ? ?Patient alert and oriented and in no cardiopulmonary distress. ? ?HEENT: No facial asymmetry, EOMI,     Neck supple . ? ?Chest: Clear to auscultation bilaterally. ? ?CVS: S1, S2 no murmurs, no S3.Regular rate. ? ?ABD: Soft non tender.  ? ?Ext: No edema ? ?MS: decreased  ROM spine, shoulders, hips and knees. ? ?Skin: Intact, no ulcerations or rash noted. ? ?Psych: Good eye contact, normal affect. Memory intact not anxious or depressed appearing. ? ?CNS: CN 2-12 intact, power,  normal throughout.no focal deficits noted. ? ? ?Assessment & Plan ? ?Pain management contract agreement ?Discussed, reviewed and signed at visit ?UDS sent ?Opioid risk screening completed ? ?Morbid obesity ([place]) ? ?Patient re-educated about   the importance of commitment to a  minimum of 150 minutes of exercise per week as able. ? ?The importance of healthy food choices with portion control discussed, as well as eating regularly and within a 12 hour window most days. ?The need to choose "clean , green" food 50 to 75% of the time is discussed, as well as to make water the primary drink and set a goal of 64 ounces water daily. ? ?  ?Weight /BMI 07/01/2021 06/23/2021 06/09/2021  ?WEIGHT 320 lb 317 lb 321 lb  ?HEIGHT '5\' 4"'$  '5\' 4"'$  '5\' 4"'$   ?BMI 54.93 kg/m2 54.41 kg/m2 55.1 kg/m2  ? ? ? ? ?Essential hypertension, benign ?DASH diet and commitment to daily physical activity for a minimum of 30 minutes discussed and encouraged, as a part of hypertension management. ?The importance of attaining a healthy weight is also discussed. ? ?BP/Weight 07/01/2021 06/23/2021 06/09/2021 04/18/2021 03/29/2021 03/23/2021 03/03/2021  ?Systolic BP 629 528 413 244 136 112 128  ?Diastolic BP 77 75 73 98 80 70 78  ?Wt. (Lbs) 320 317 321 310.2 312 314.2 317.04  ?BMI 54.93 54.41 55.1 53.25 53.55 53.93 54.42  ? ? ? ?Controlled, no change in medication ? ? ?Encounter for pain management ?The patient's Controlled Substance registry is reviewed and compliance confirmed. ?Adequacy of  Pain control and level of function is assessed. ?Medication dosing is adjusted as deemed appropriate. ?Twelve weeks of medication is prescribed , with a follow up appointment between 11 to 12 weeks . ? ? ?DM (diabetes mellitus) ([place]) ?Uncontrolled but improving, managed by  Endo ?Angela [name] is reminded of the importance of commitment to daily physical activity for 30 minutes or more, as able and the need to limit carbohydrate intake to 30 to 60 grams per meal to help with blood sugar control.  ? ?The need to take medication as prescribed, test blood sugar as directed, and to call between visits if there is a concern that blood sugar is uncontrolled is also discussed.  ? ?Angela [name] is reminded of the importance of daily foot  exam, annual eye examination, and good blood sugar, blood pressure and cholesterol control. ? ?Diabetic Labs Latest Ref Rng & Units 06/23/2021 06/09/2021 03/23/2021 03/17/2021 12/21/2020  ?HbA1c 0.0 - 7.0 % 9.2(A) - 9.1(A) - 8.9(A)  ?Microalbumin Not Estab. ug/mL - - - - -  ?Micro/Creat Ratio <30 mcg/mg creat - - - - -  ?Chol 100 - 199 mg/dL - 139 - - -  ?HDL >39 mg/dL - 48 - - -  ?Calc LDL 0 - 99 mg/dL - 75 - - -  ?Triglycerides 0 - 149 mg/dL - 84 - - -  ?Creatinine 0.57 - 1.00 mg/dL - 0.91 - 0.81 -  ?GFR >60.00 mL/min - - - - -  ? ?BP/Weight 07/01/2021 06/23/2021 06/09/2021 04/18/2021 03/29/2021 03/23/2021 03/03/2021  ?Systolic BP 762 831 517 616 136 112 128  ?Diastolic BP 77 75 73 98 80 70 78  ?Wt. (Lbs) 320 317 321 310.2 312 314.2 317.04  ?BMI 54.93 54.41 55.1 53.25 53.55 53.93 54.42  ? ?Foot/eye exam completion dates Latest Ref Rng & Units 01/19/2021 02/11/2020  ?Eye Exam No Retinopathy No Retinopathy -  ?Foot Form Completion - - Done  ? ? ? ? ? ? ?Asthma ?Reports needing neb solution refilled , will have this done ? ?Chronic diastolic heart failure ([place]) ?Stable and asymptomatic ? ?Depression, [place], single episode, severe ([place]) ?Controlled, no change in medication ? ? ?Lumbar disc disease with radiculopathy ?Increased and uncontrolled pain, toradol 60 mg IM administered ? ? ?

## 2021-07-11 NOTE — Assessment & Plan Note (Signed)
Discussed, reviewed and signed at visit ?UDS sent ?Opioid risk screening completed ?

## 2021-07-11 NOTE — Assessment & Plan Note (Signed)
Uncontrolled but improving, managed by Endo ?Angela [name] is reminded of the importance of commitment to daily physical activity for 30 minutes or more, as able and the need to limit carbohydrate intake to 30 to 60 grams per meal to help with blood sugar control.  ? ?The need to take medication as prescribed, test blood sugar as directed, and to call between visits if there is a concern that blood sugar is uncontrolled is also discussed.  ? ?Angela [name] is reminded of the importance of daily foot exam, annual eye examination, and good blood sugar, blood pressure and cholesterol control. ? ?Diabetic Labs Latest Ref Rng & Units 06/23/2021 06/09/2021 03/23/2021 03/17/2021 12/21/2020  ?HbA1c 0.0 - 7.0 % 9.2(A) - 9.1(A) - 8.9(A)  ?Microalbumin Not Estab. ug/mL - - - - -  ?Micro/Creat Ratio <30 mcg/mg creat - - - - -  ?Chol 100 - 199 mg/dL - 139 - - -  ?HDL >39 mg/dL - 48 - - -  ?Calc LDL 0 - 99 mg/dL - 75 - - -  ?Triglycerides 0 - 149 mg/dL - 84 - - -  ?Creatinine 0.57 - 1.00 mg/dL - 0.91 - 0.81 -  ?GFR >60.00 mL/min - - - - -  ? ?BP/Weight 07/01/2021 06/23/2021 06/09/2021 04/18/2021 03/29/2021 03/23/2021 03/03/2021  ?Systolic BP 277 824 235 361 136 112 128  ?Diastolic BP 77 75 73 98 80 70 78  ?Wt. (Lbs) 320 317 321 310.2 312 314.2 317.04  ?BMI 54.93 54.41 55.1 53.25 53.55 53.93 54.42  ? ?Foot/eye exam completion dates Latest Ref Rng & Units 01/19/2021 02/11/2020  ?Eye Exam No Retinopathy No Retinopathy -  ?Foot Form Completion - - Done  ? ? ? ? ? ?

## 2021-07-11 NOTE — Assessment & Plan Note (Signed)
The patient's Controlled Substance registry is reviewed and compliance confirmed. Adequacy of  Pain control and level of function is assessed. Medication dosing is adjusted as deemed appropriate. Twelve weeks of medication is prescribed , with a follow up appointment between 11 to 12 weeks .  

## 2021-07-11 NOTE — Assessment & Plan Note (Signed)
Increased and uncontrolled pain, toradol 60 mg IM administered ?

## 2021-07-11 NOTE — Assessment & Plan Note (Signed)
Stable and asymptomatic  

## 2021-07-11 NOTE — Telephone Encounter (Signed)
Patient aware.

## 2021-07-11 NOTE — Assessment & Plan Note (Signed)
?  Patient re-educated about  the importance of commitment to a  minimum of 150 minutes of exercise per week as able. ? ?The importance of healthy food choices with portion control discussed, as well as eating regularly and within a 12 hour window most days. ?The need to choose "clean , green" food 50 to 75% of the time is discussed, as well as to make water the primary drink and set a goal of 64 ounces water daily. ? ?  ?Weight /BMI 07/01/2021 06/23/2021 06/09/2021  ?WEIGHT 320 lb 317 lb 321 lb  ?HEIGHT '5\' 4"'$  '5\' 4"'$  '5\' 4"'$   ?BMI 54.93 kg/m2 54.41 kg/m2 55.1 kg/m2  ? ? ? ?

## 2021-07-11 NOTE — Assessment & Plan Note (Signed)
Controlled, no change in medication  

## 2021-07-11 NOTE — Assessment & Plan Note (Signed)
DASH diet and commitment to daily physical activity for a minimum of 30 minutes discussed and encouraged, as a part of hypertension management. ?The importance of attaining a healthy weight is also discussed. ? ?BP/Weight 07/01/2021 06/23/2021 06/09/2021 04/18/2021 03/29/2021 03/23/2021 03/03/2021  ?Systolic BP 239 532 023 343 136 112 128  ?Diastolic BP 77 75 73 98 80 70 78  ?Wt. (Lbs) 320 317 321 310.2 312 314.2 317.04  ?BMI 54.93 54.41 55.1 53.25 53.55 53.93 54.42  ? ? ? ?Controlled, no change in medication ? ?

## 2021-07-11 NOTE — Telephone Encounter (Signed)
Patient need refill ? ?oxyCODONE (OXYCONTIN) 40 mg 12 hr tablet  ? ?[facility] ?

## 2021-07-13 ENCOUNTER — Ambulatory Visit (INDEPENDENT_AMBULATORY_CARE_PROVIDER_SITE_OTHER): Payer: Medicare Other | Admitting: Internal Medicine

## 2021-07-13 ENCOUNTER — Other Ambulatory Visit: Payer: Self-pay

## 2021-07-13 DIAGNOSIS — K7689 Other specified diseases of liver: Secondary | ICD-10-CM

## 2021-07-13 DIAGNOSIS — K760 Fatty (change of) liver, not elsewhere classified: Secondary | ICD-10-CM | POA: Insufficient documentation

## 2021-07-13 NOTE — Progress Notes (Signed)
? ? ?Primary Care Physician:  Fayrene Helper, MD ?Primary Gastroenterologist:  Dr. Abbey Chatters ? ?Chief Complaint  ?Patient presents with  ? Follow-up  ? ? ?HPI:   ?Angela [name] is a 61 y.o. female who presents to the clinic today for follow-up visit.  Previously seen for abnormal liver function test.  Patient's liver tests were normal on blood work 05/06/2020, on repeat blood work 11/24/2020 she had acute elevation including AST 457, ALT 272, T bili 0.4, alk phos 175.  She does note recently starting rosuvastatin prior to this blood work.  She underwent ultrasound 12/03/2020 which showed fatty liver disease as well as multiple liver cysts larger which measuring 4.6 x 5.2 x 4.9 cm.  Multiple cysts were seen on CT scan in 2019 as well.  Patient does have risk factors for fatty liver disease including obesity, diabetes, dyslipidemia.  No alcohol use.  No family history of liver disease.   ? ?Underwent full serological work-up which was negative.  Her LFTs normalized.  She was rechallenged with rosuvastatin recently and she had again had elevation in her LFTs.  This medication has been discontinued and her LFTs have normalized. ? ?States she had a colonoscopy in 2021 at [facility] for colon cancer screening. ? ? ?Past Medical History:  ?Diagnosis Date  ? Allergy   ? Phreesia 06/02/2020  ? Anxiety   ? Arthritis   ? Asthma   ? Asthma   ? Phreesia 04/12/2020  ? Breast mass   ? left (monitor)  ? CHF (congestive heart failure) ([place])   ? Phreesia 04/12/2020  ? Coronary atherosclerosis of native coronary artery   ? Mild nonobstructive CAD by cardiac catheterization 2008 - Dr. Terrence Dupont  ? Degenerative disc disease   ? Depression   ? Depression   ? Phreesia 04/12/2020  ? Diabetes mellitus without complication ([place])   ? Phreesia 04/12/2020  ? Dysrhythmia   ? Essential hypertension, benign   ? Folliculitis 01/74/9449  ? GERD (gastroesophageal reflux disease)   ? H/O hiatal hernia   ? Helicobacter pylori gastritis 2008  ?  Hypercholesterolemia   ? Hypertension   ? Hyperthyroidism   ? s/p radiation  ? Left-sided epistaxis 05/15/2018  ? Low back pain   ? Migraine   ? Nephrolithiasis   ? Recurring episodes since 2004  ? Neuromuscular disorder ([place])   ? Phreesia 04/12/2020  ? Severe obesity (BMI >= 40) (H[facility])   ? Sleep apnea   ? STOP BANG SCORE 6no cpap used  ? Thyroid disease   ? Phreesia 04/12/2020  ? Type 2 diabetes mellitus with diabetic neuropathy ([facility])   ? ? ?Past Surgical History:  ?Procedure Laterality Date  ? ABDOMINAL HYSTERECTOMY    ? [place]  ? BACK SURGERY  1994  ? BACK SURGERY  2002  ? BACK SURGERY  2011  ? [facility]   ? CARPAL TUNNEL RELEASE Left   ? CHOLECYSTECTOMY  1996  ? COLONOSCOPY  2000 [facility]  ? INT HEMORRHOIDS/FISSURE  ? COLONOSCOPY  2003 [facility], CHANGE IN BOWEL HABITS  ? INT HEMORRHOIDS  ? COLONOSCOPY  2006 [facility]  ? INT HEMORRHOIDS  ? COLONOSCOPY  2007 [facility]  ? INT HEMORRHOIDS  ? CYSTOSCOPY WITH STENT PLACEMENT Left 07/25/2012  ? Procedure: CYSTOSCOPY, left retograde pyelogram WITH left ureteral  STENT PLACEMENT;  Surgeon: Claybon Jabs, MD;  Location: [facility];  Service: Urology;  Laterality: Left;  ? CYSTOSCOPY/RETROGRADE/URETEROSCOPY  12/04/2011  ? Procedure: CYSTOSCOPY/RETROGRADE/URETEROSCOPY;  Surgeon: Quillian Quince  Julieanne Cotton, MD;  Location: [facility];  Service: Urology;  Laterality: Right;  ? CYSTOSCOPY/RETROGRADE/URETEROSCOPY/STONE EXTRACTION WITH BASKET Left 09/03/2012  ? Procedure: CYSTOSCOPY/RETROGRADE pyelogram/digital URETEROSCOPY/STONE EXTRACTION WITH BASKET, left stent removal;  Surgeon: Molli Hazard, MD;  Location: [facility];  Service: Urology;  Laterality: Left;  ? ESOPHAGOGASTRODUODENOSCOPY  2008  ? [facility]:XYVOP hiatal hernia./Normal esophagus without evidence of Barrett's mass, stricture, erosion or ulceration./Normal duodenal bulb and second portion of the duodenum./Diffuse erythema in the body and the antrum with occasional erosion.  Biopsies obtained via cold forceps to evaluate for H.  pylori gastritis  ? FRACTURE SURGERY  1999  ? right clavicle  ? HOLMIUM LASER APPLICATION Left 01/01/9243  ? Procedure: HOLMIUM LASER APPLICATION;  Surgeon: Molli Hazard, MD;  Location: [facility];  Service: Urology;  Laterality: Left;  ? KNEE ARTHROSCOPY  10/04/2010,   ? right knee arthroscopy, dr Theda Sers  ? LEFT HEART CATH AND CORONARY ANGIOGRAPHY N/A 05/08/2018  ? Procedure: LEFT HEART CATH AND CORONARY ANGIOGRAPHY;  Surgeon: Troy Sine, MD;  Location: [place] [facility];  Service: Cardiovascular;  Laterality: N/A;  ? left knee arthroscopic surgery  1999  ? Left salphingectomy secondary to ectopic pregnancy  1991  ? PARTIAL HYSTERECTOMY  1991  ? RIGHT HEART CATH N/A 07/07/2019  ? Procedure: RIGHT HEART CATH;  Surgeon: Leonie Man, MD;  Location: [place] [facility];  Service: Cardiovascular;  Laterality: N/A;  ? rotary cuff  Right 05/14/2014  ? [place] [facility]  ? SPINE SURGERY N/A   ? Phreesia 06/02/2020  ? UPPER GASTROINTESTINAL ENDOSCOPY  2008 abd pain  ? H. pylori gastritis  ? UPPER GASTROINTESTINAL ENDOSCOPY  1996 [place]  ? PUD  ? ? ?Current Outpatient Medications  ?Medication Sig Dispense Refill  ? albuterol (VENTOLIN HFA) 108 (90 Base) MCG/ACT inhaler Inhale 2 puffs into the lungs every 6 (six) hours as needed for wheezing or shortness of breath. 8 g 6  ? amLODipine (NORVASC) 10 MG tablet TAKE 1 TABLET BY MOUTH EVERY DAY 90 tablet 3  ? B-D UF III MINI PEN NEEDLES 31G X 5 MM MISC 2 (two) times daily. as directed    ? betamethasone, augmented, (DIPROLENE) 0.05 % lotion Apply 1 application. topically 2 (two) times daily.    ? blood glucose meter kit and supplies Dispense based on patient and insurance preference. Once daily testing dx. e11.9 1 each 0  ? calcipotriene (DOVONOX) 0.005 % ointment Apply 1 application topically daily.     ? clobetasol cream (TEMOVATE) 6.28 % Apply 1 application. topically 2 (two) times daily.    ? cloNIDine (CATAPRES - DOSED IN MG/24 HR) 0.3 mg/24hr patch APPLY 1 PATCH TO SKIN  ONCE A WEEK 12 patch 0  ? cyclobenzaprine (FLEXERIL) 10 MG tablet TAKE 1 TABLET BY MOUTH THREE TIMES A DAY 90 tablet 1  ? diazepam (VALIUM) 5 MG tablet Take one tablet by mouth three times daily for spasm 90 tablet 5  ? DULoxetine (CYMBALTA) 60 MG capsule TAKE 1 CAPSULE BY MOUTH 2 TIMES DAILY. 180 capsule 2  ? ergocalciferol (VITAMIN D2) 1.25 MG (50000 UT) capsule Take 1 capsule (50,000 Units total) by mouth once a week. One capsule once weekly 12 capsule 3  ? esomeprazole (NEXIUM) 40 MG capsule TAKE 1 CAPSULE BY MOUTH EVERY DAY 90 capsule 0  ? fluticasone (FLONASE) 50 MCG/ACT nasal spray Place 2 sprays into both nostrils daily. 16 g 6  ? furosemide (LASIX) 40 MG tablet TAKE 1 TABLET BY  MOUTH EVERY DAY 90 tablet 0  ? gabapentin (NEURONTIN) 800 MG tablet TAKE 1 TABLET BY MOUTH 4 TIMES DAILY. 360 tablet 0  ? glipiZIDE (GLUCOTROL XL) 10 MG 24 hr tablet TAKE 1 TABLET BY MOUTH EVERY DAY WITH BREAKFAST 90 tablet 0  ? hydrOXYzine (ATARAX/VISTARIL) 10 MG tablet Take one tablet by mouth art bedtime 30 tablet 5  ? Insulin Lispro Prot & Lispro (HUMALOG MIX 75/25 KWIKPEN) (75-25) 100 UNIT/ML Kwikpen Inject 30 Units into the skin 2 (two) times daily with a meal. 90 mL 3  ? ipratropium-albuterol (DUONEB) 0.5-2.5 (3) MG/3ML SOLN Take 3 mLs by nebulization every 6 (six) hours as needed. 360 mL 3  ? KLOR-CON M20 20 MEQ tablet TAKE 1 TABLET BY MOUTH TWICE A DAY 180 tablet 1  ? Lancets (ONETOUCH DELICA PLUS [id number]) MISC USE TO TEST ONCE DAILY 100 each 0  ? levothyroxine (SYNTHROID) 75 MCG tablet TAKE 1 TABLET BY MOUTH EVERY DAY 90 tablet 1  ? loratadine (CLARITIN) 10 MG tablet TAKE 1 TABLET BY MOUTH EVERY DAY 90 tablet 3  ? metFORMIN (GLUCOPHAGE) 500 MG tablet TAKE 1 TABLET BY MOUTH 2 TIMES DAILY WITH A MEAL. 180 tablet 0  ? metoprolol tartrate (LOPRESSOR) 25 MG tablet Take 1 tablet (25 mg total) by mouth 2 (two) times daily. 180 tablet 3  ? naloxone (NARCAN) nasal spray 4 mg/0.1 mL One spray into each nostril once as needed 1 each  0  ? nitroGLYCERIN (NITROSTAT) 0.4 MG SL tablet Place 1 tablet (0.4 mg total) under the tongue every 5 (five) minutes as needed for chest pain. 30 tablet 7  ? ONETOUCH VERIO test strip USE AS INSTRUCTED FOUR TIMES

## 2021-07-13 NOTE — Patient Instructions (Addendum)
I am happy to see that your liver numbers have normalized.  Likely this was due to the rosuvastatin.  Would recommend that you stay off this medication. ? ?You do have fatty liver disease.  Would recommend continue to work on weight loss, eating healthier.  Keep your diabetes and cholesterol under good control. ? ?I would recommend MRI to further evaluate your liver cysts as at least one of them appears to be complicated cyst.  I understand that you do not want to have this done.  If you change your mind, then just give [place] a call we will set it up. ? ?Otherwise follow-up as needed. ? ?It was very nice seeing you again today. ? ?Dr. Abbey Chatters ? ?Nonalcoholic Fatty Liver Disease Diet, Adult ?Nonalcoholic fatty liver disease is a condition that causes fat to build up in and around the liver. The disease makes it harder for the liver to work the way that it should. Following a healthy diet can help to keep nonalcoholic fatty liver disease under control. It can also help to prevent or improve conditions that are associated with the disease, such as heart disease, diabetes, high blood pressure, and abnormal cholesterol levels. ?Along with regular exercise, this diet: ?Promotes weight loss. ?Helps to control blood sugar levels. ?Helps to improve the way that the body uses insulin. ?What are tips for following this plan? ?Reading food labels ? ?Always check food labels for: ?The amount of saturated fat in a food. You should limit your intake of saturated fat. Saturated fat is found in foods that come from animals, including meat and dairy products such as butter, cheese, and whole milk. ?The amount of fiber in a food. You should choose high-fiber foods such as fruits, vegetables, and whole grains. Try to get 25-30 grams (g) of fiber a day. ?  ?Cooking ?When cooking, use heart-healthy oils that are high in monounsaturated fats. These include olive oil, canola oil, and avocado oil. ?Limit frying or deep-frying foods. Cook foods  using healthy methods such as baking, boiling, steaming, and grilling instead. ?Meal planning ?You may want to keep track of how many calories you take in. Eating the right amount of calories will help you achieve a healthy weight. Meeting with a registered dietitian can help you get started. ?Limit how often you eat takeout and fast food. These foods are usually very high in fat, salt, and sugar. ?Use the glycemic index (GI) to plan your meals. The index tells you how quickly a food will raise your blood sugar. Choose low-GI foods (GI less than 55). These foods take a longer time to raise blood sugar. A registered dietitian can help you identify foods lower on the GI scale. ?Lifestyle ?You may want to follow a Mediterranean diet. This diet includes a lot of vegetables, lean meats or fish, whole grains, fruits, and healthy oils and fats. ?What foods can I eat? ? ?  ?Fruits ?Bananas. Apples. Oranges. Grapes. Papaya. Mango. Pomegranate. Kiwi. Grapefruit. Cherries. ?Vegetables ?Lettuce. Spinach. Peas. Beets. Cauliflower. Cabbage. Broccoli. Carrots. Tomatoes. Squash. Eggplant. Herbs. Peppers. Onions. Cucumbers. Brussels sprouts. Yams and sweet potatoes. Beans. Lentils. ?Grains ?Whole wheat or whole-grain foods, including breads, crackers, cereals, and pasta. Stone-ground whole wheat. Unsweetened oatmeal. Bulgur. Barley. Quinoa. Brown or wild rice. Corn or whole wheat flour tortillas. ?Meats and other proteins ?Lean meats. Poultry. Tofu. Seafood and shellfish. ?Dairy ?Low-fat or fat-free dairy products, such as yogurt, cottage cheese, or cheese. ?Beverages ?Water. Sugar-free drinks. Tea. Coffee. Low-fat or skim milk. Milk  alternatives, such as soy or almond milk. Real fruit juice. ?Fats and oils ?Avocado. Canola or olive oil. Nuts and nut butters. Seeds. ?Seasonings and condiments ?Mustard. Relish. Low-fat, low-sugar ketchup and barbecue sauce. Low-fat or fat-free mayonnaise. ?Sweets and desserts ?Sugar-free sweets. ?The  items listed above may not be a complete list of foods and beverages you can eat. Contact a dietitian for more information. ?What foods should I limit or avoid? ?Meats and other proteins ?Limit red meat to 1-2 times a week. ?Dairy ?Full-fat dairy. ?Fats and oils ?Palm oil and coconut oil. Fried foods. ?Other foods ?Processed foods. Foods that contain a lot of salt or sodium. ?Sweets and desserts ?Sweets that contain sugar. ?Beverages ?Sweetened drinks, such as sweet tea, milkshakes, iced sweet drinks, and sodas. Alcohol. ?The items listed above may not be a complete list of foods and beverages you should avoid. Contact a dietitian for more information. ?Where to find more information ?The [employer] of Diabetes and Digestive and Kidney Diseases: [employer].is ?Summary ?Nonalcoholic fatty liver disease is a condition that causes fat to build up in and around the liver. ?Following a healthy diet can help to keep nonalcoholic fatty liver disease under control. Your diet should be rich in fruits, vegetables, whole grains, and lean proteins. ?Limit your intake of saturated fat. Saturated fat is found in foods that come from animals, including meat and dairy products such as butter, cheese, and whole milk. ?This diet promotes weight loss, helps to control blood sugar levels, and helps to improve the way that the body uses insulin. ?This information is not intended to replace advice given to you by your health care provider. Make sure you discuss any questions you have with your health care provider. ?Document Revised: 08/09/2018 Document Reviewed: 05/09/2018 ?Elsevier Patient Education ? [place]. ? ?At [facility] we value your feedback. You may receive a survey about your visit today. Please share your experience as we strive to create trusting relationships with our patients to provide genuine, compassionate, quality care. ? ?We appreciate your understanding and patience as we review  any laboratory studies, imaging, and other diagnostic tests that are ordered as we care for you. Our office policy is 5 business days for review of these results, and any emergent or urgent results are addressed in a timely manner for your best interest. If you do not hear from our office in 1 week, please contact us.  ? ?We also encourage the use of MyChart, which contains your medical information for your review as well. If you are not enrolled in this feature, an access code is on this after visit summary for your convenience. Thank you for allowing [place] to be involved in your care. ? ?It was great to see you today!  I hope you have a great rest of your Winter! ? ? ? ?Angela [name]. Abbey Chatters, D.O. ?Gastroenterology and Hepatology ?[facility] ? ?

## 2021-07-19 ENCOUNTER — Telehealth (INDEPENDENT_AMBULATORY_CARE_PROVIDER_SITE_OTHER): Payer: Medicare Other | Admitting: Licensed Clinical Social Worker

## 2021-07-19 DIAGNOSIS — F322 Major depressive disorder, single episode, severe without psychotic features: Secondary | ICD-10-CM

## 2021-07-19 NOTE — BH Specialist Note (Signed)
Integrated Behavioral Health via Telemedicine Visit ? ?07/19/2021 ?Angela [name] ?[id number] ? ?Number of Angela [name] Clinician visits: Additional Visit ? ?Session Start time: 1000 ?  ? ?Session End time: 4098 ? ?Total time in minutes: 71 ? ? ?Referring Provider: Dr. Moshe Cipro ?Patient/Family location: home ?[facility] Provider location: home office ?All persons participating in visit: [name]. Angela [name] ?Types of Service: Telephone visit and Collaborative care ? ?I connected with [name]  via  Telephone or Geologist, engineering  (Video is Tree surgeon) and verified that I am speaking with the correct person using two identifiers. Discussed confidentiality: Yes  ? ?I discussed the limitations of telemedicine and the availability of in person appointments.  Discussed there is a possibility of technology failure and discussed alternative modes of communication if that failure occurs. ? ?I discussed that engaging in this telemedicine visit, they consent to the provision of behavioral healthcare and the services will be billed under their insurance. ? ?Patient and/or legal guardian expressed understanding and consented to Telemedicine visit: Yes  ? ?Presenting Concerns: ?Patient and/or family reports the following symptoms/concerns: sadness, lack of motivation ?Duration of problem: more than 6 months; Severity of problem: severe ? ?Patient and/or Family's Strengths/Protective Factors: ?Sense of purpose ? ?Goals Addressed: ?Patient will: ? Reduce symptoms of: anxiety, depression, and mood instability  ? Increase knowledge and/or ability of: coping skills and healthy habits  ? Demonstrate ability to: Increase healthy adjustment to current life circumstances ? ?Progress towards Goals: ?Ongoing ? ?Interventions: ?Interventions utilized:  Motivational Interviewing, Behavioral Activation, and CBT Cognitive Behavioral Therapy ?Standardized Assessments completed: GAD-7 and PHQ  9 ? ?Patient and/or Family Response: Angela was open & active throughout the session.  She was able to verbalize her current coping ability (relaxation) ? ?Assessment: ?Patient currently experiencing sadness.  ? ?Patient may benefit from continued treatment. ? ?Plan: ?Follow up with behavioral health clinician on : 08/02/21 ?Behavioral recommendations: continued VBH session ? ?I discussed the assessment and treatment plan with the patient and/or parent/guardian. They were provided an opportunity to ask questions and all were answered. They agreed with the plan and demonstrated an understanding of the instructions. ?  ?They were advised to call back or seek an in-person evaluation if the symptoms worsen or if the condition fails to improve as anticipated. ? ?Lubertha South, LCSW ?

## 2021-07-22 ENCOUNTER — Other Ambulatory Visit: Payer: Self-pay | Admitting: Pulmonary Disease

## 2021-07-22 ENCOUNTER — Other Ambulatory Visit: Payer: Self-pay | Admitting: Family Medicine

## 2021-07-22 DIAGNOSIS — I10 Essential (primary) hypertension: Secondary | ICD-10-CM

## 2021-07-26 ENCOUNTER — Other Ambulatory Visit: Payer: Self-pay | Admitting: Family Medicine

## 2021-08-03 DIAGNOSIS — I2510 Atherosclerotic heart disease of native coronary artery without angina pectoris: Secondary | ICD-10-CM | POA: Diagnosis not present

## 2021-08-03 DIAGNOSIS — I509 Heart failure, unspecified: Secondary | ICD-10-CM | POA: Diagnosis not present

## 2021-08-03 DIAGNOSIS — R55 Syncope and collapse: Secondary | ICD-10-CM | POA: Diagnosis not present

## 2021-08-03 DIAGNOSIS — M5416 Radiculopathy, lumbar region: Secondary | ICD-10-CM | POA: Diagnosis not present

## 2021-08-04 DIAGNOSIS — R0609 Other forms of dyspnea: Secondary | ICD-10-CM | POA: Diagnosis not present

## 2021-08-16 DIAGNOSIS — M25531 Pain in right wrist: Secondary | ICD-10-CM | POA: Diagnosis not present

## 2021-08-18 ENCOUNTER — Other Ambulatory Visit: Payer: Self-pay | Admitting: Family Medicine

## 2021-08-21 ENCOUNTER — Other Ambulatory Visit: Payer: Self-pay | Admitting: Family Medicine

## 2021-08-22 ENCOUNTER — Ambulatory Visit (INDEPENDENT_AMBULATORY_CARE_PROVIDER_SITE_OTHER): Payer: Medicare Other

## 2021-08-22 ENCOUNTER — Ambulatory Visit (INDEPENDENT_AMBULATORY_CARE_PROVIDER_SITE_OTHER): Payer: Medicare Other | Admitting: Podiatry

## 2021-08-22 ENCOUNTER — Encounter: Payer: Self-pay | Admitting: Podiatry

## 2021-08-22 DIAGNOSIS — M79674 Pain in right toe(s): Secondary | ICD-10-CM | POA: Diagnosis not present

## 2021-08-22 DIAGNOSIS — M60271 Foreign body granuloma of soft tissue, not elsewhere classified, right ankle and foot: Secondary | ICD-10-CM | POA: Diagnosis not present

## 2021-08-22 DIAGNOSIS — M79675 Pain in left toe(s): Secondary | ICD-10-CM

## 2021-08-22 DIAGNOSIS — Q828 Other specified congenital malformations of skin: Secondary | ICD-10-CM | POA: Diagnosis not present

## 2021-08-22 DIAGNOSIS — B351 Tinea unguium: Secondary | ICD-10-CM | POA: Diagnosis not present

## 2021-08-22 DIAGNOSIS — E1142 Type 2 diabetes mellitus with diabetic polyneuropathy: Secondary | ICD-10-CM | POA: Diagnosis not present

## 2021-08-23 ENCOUNTER — Other Ambulatory Visit: Payer: Self-pay | Admitting: Family Medicine

## 2021-08-23 DIAGNOSIS — I10 Essential (primary) hypertension: Secondary | ICD-10-CM

## 2021-08-27 ENCOUNTER — Other Ambulatory Visit: Payer: Self-pay | Admitting: Nurse Practitioner

## 2021-08-27 DIAGNOSIS — E1159 Type 2 diabetes mellitus with other circulatory complications: Secondary | ICD-10-CM

## 2021-08-28 NOTE — Progress Notes (Signed)
Subjective: ?Angela [name] is a 61 y.o. female patient seen today for follow up of  preventative diabetic foot care and painful porokeratotic lesion(s) of the left foot and painful mycotic toenails that limit ambulation. Painful toenails interfere with ambulation. Aggravating factors include wearing enclosed shoe gear. Pain is relieved with periodic professional debridement. Painful porokeratotic lesions are aggravated when weightbearing with and without shoegear. Pain is relieved with periodic professional debridement. ? ?New problems reported today: Pt was unaware of lesion on the plantar aspect of her left heel. She does admit to walking around the house with socks on at times. She feels some discomfort  ? ?Patient states their blood glucose was 109 mg/dl today.  ? ?PCP is Angela Helper, MD. Last visit was: 03/03/2021. ? ?Allergies  ?Allergen Reactions  ? Latex Itching and Rash  ? Morphine Other (See Comments)  ?  Reaction with esophagus, unable to swallow.   ? Morphine And Related Shortness Of Breath and Swelling  ? Other Itching, Shortness Of Breath and Swelling  ? Ace Inhibitors Cough  ? Aspirin Other (See Comments)  ?  Reaction with esophagus, unable to swallow.   ? Losartan Cough  ? Tapentadol Other (See Comments)  ?  Nausea, increased sleepiness, h/a  ? Tomato Other (See Comments)  ?  Acid reflux due to acid in tomato  ? Adhesive [Tape] Rash  ? ?Objective: ?Physical Exam ? ?General: Patient is a pleasant 61 y.o. African American female morbidly obese in NAD. AAO x 3.  ? ?Neurovascular Examination: ?CFT immediate b/l LE. Palpable DP/PT pulses b/l LE. Digital hair sparse b/l. Skin temperature gradient WNL b/l. No pain with calf compression b/l. No edema noted b/l. No cyanosis or clubbing noted b/l LE. ? ?Pt has subjective symptoms of neuropathy. Protective sensation decreased with 10 gram monofilament b/l. ? ?Dermatological:  ?Pedal integument with normal turgor, texture and tone b/l LE. No open wounds  b/l. No interdigital macerations b/l. Toenails 1-5 b/l elongated, thickened, discolored with subungual debris. +Tenderness with dorsal palpation of nailplates. Porokeratotic lesion(s) noted submet head 5 left foot. ?Black lesion plantar aspect of left heel with characteristics of porokeratosis. ? ?Musculoskeletal:  ?Muscle strength 5/5 to all lower extremity muscle groups bilaterally. HAV with bunion deformity noted b/l LE. Utilizes cane for ambulation assistance. ? ?Xray findings left lower extremity: ?No gas in tissues left foot. ?No evidence of fracture left foot. ?No foreign body evident left heel.  ? ?Assessment: ?1. Pain due to onychomycosis of toenails of both feet   ?2. Foreign body granuloma of soft tissue of right foot   ?3. Porokeratosis   ?4. Diabetic peripheral neuropathy associated with type 2 diabetes mellitus ([place])   ? ?Plan: ?Patient was evaluated and treated and all questions answered. ?Consent given for treatment as described below: ?-Xrays left foot reviewed with patient and daughter today. ?-Mycotic toenails 1-5 bilaterally were debrided in length and girth with sterile nail nippers and dremel without incident. ?-Painful porokeratotic lesion(s) submet head 5 left foot pared with dremel without incident. Total number of lesions debrided=1. ?-Painful porokeratotic lesion(s) plantar heel pad of left foot pared and enucleated with sterile scalpel blade without incident. Total number of lesions debrided=1. ?-Patient/POA to call should there be question/concern in the interim.  ?Return in about 3 months (around 11/21/2021). ? ?Angela [name], DPM ?

## 2021-08-29 ENCOUNTER — Other Ambulatory Visit: Payer: Self-pay

## 2021-08-29 ENCOUNTER — Ambulatory Visit (INDEPENDENT_AMBULATORY_CARE_PROVIDER_SITE_OTHER): Payer: Medicare Other | Admitting: Pulmonary Disease

## 2021-08-29 ENCOUNTER — Encounter: Payer: Self-pay | Admitting: Pulmonary Disease

## 2021-08-29 DIAGNOSIS — G4733 Obstructive sleep apnea (adult) (pediatric): Secondary | ICD-10-CM

## 2021-08-29 DIAGNOSIS — J4530 Mild persistent asthma, uncomplicated: Secondary | ICD-10-CM

## 2021-08-29 DIAGNOSIS — I5032 Chronic diastolic (congestive) heart failure: Secondary | ICD-10-CM

## 2021-08-29 MED ORDER — ALBUTEROL SULFATE HFA 108 (90 BASE) MCG/ACT IN AERS: 2.0000 | INHALATION_SPRAY | Freq: Four times a day (QID) | RESPIRATORY_TRACT | 6 refills | Status: DC | PRN

## 2021-08-29 NOTE — Assessment & Plan Note (Signed)
She is compliant with Lasix. ?We discussed weight monitoring as an early sign of fluid retention ?

## 2021-08-29 NOTE — Patient Instructions (Addendum)
?  CPAP is working well ? ?X Refills on albuterol ? ?Check O2 sat on RA ? ?

## 2021-08-29 NOTE — Assessment & Plan Note (Signed)
Again I am not convinced about true asthma here.  Dyspnea is more likely related to morbid obesity and deconditioning.  I have asked her to decrease her frequent use of albuterol MDI and use oxygen instead or simply rest to recover her breath after exertion. ?She will continue on Symbicort and refills will be provided ?

## 2021-08-29 NOTE — Progress Notes (Signed)
? ?  Subjective:  ? ? [name], female    DOB: Feb 21, 1961, 61 y.o.   MRN: [id number] ? ?HPI ? ?61 yo morbidly obese never smoker for follow-up of "asthma" and chronic diastolic heart failure & OSA  ?-on CPAP 10 cm with full facemask , DME - [facility] ? ?Asthma was diagnosed in 2014  ?She reports onset of intermittent wheezing and a dry cough since her cardiac cath in 05/2018.   ? ?PMH -  difficult to control hypertension  on carvedilol, amlodipine and clonidine, chronic opiate use ? ?Chief Complaint  ?Patient presents with  ? Follow-up  ?  Cpap working well. Wants a rescue inhaler   ? ?45-monthfollow-up, arrives accompanied by her daughter.  She hurt her right hand and has lost sensation, has seen orthopedics for this.  She ambulates with a cane ?She is pretty much using oxygen all the time now since starting on this in 2021. ?She continues to use Symbicort but does need albuterol for rescue.  It seems like she uses albuterol whenever she gets dyspneic on walking without her oxygen.  She is very sedentary otherwise and does not go outside the house much ?She is compliant with her Lasix and denies pedal edema ?She would like to me to take over care of her CPAP machine.  She has not obtain supplies from DME for a while.  She reports good compliance with her machine and we reviewed compliance report ? ? ? ?Significant tests/ events reviewed ? ? 61/2021-PFTs -ratio normal, FEV1 1.94/91%, significant bronchodilator response, improved to 105%, high normal DLCO ?  ?07/07/2019-right heart cath-no evidence of pulmonary hypertension, suspect noncardiac etiology for dyspnea although diastolic dysfunction component is also possible ? ?Split night study 01/2016 >> AHI 37/h >> CPAP 10 cm ?  ?Cardiac cath 05/2018 no significant CAD, LVEDP 14 ?  ?Echo 16/7672grade 2 diastolic dysfunction, normal LVEF ?  ?CT renal stone study 06/2017 >> bibasilar atelectasis, no ILD ?  ?2015 normal spirometry ?01/2016 split-night  study-AHI 22/hour, CPAP 10 cm ? ?Review of Systems ?neg for any significant sore throat, dysphagia, itching, sneezing, nasal congestion or excess/ purulent secretions, fever, chills, sweats, unintended wt loss, pleuritic or exertional cp, hempoptysis, orthopnea pnd or change in chronic leg swelling. Also denies presyncope, palpitations, heartburn, abdominal pain, nausea, vomiting, diarrhea or change in bowel or urinary habits, dysuria,hematuria, rash, arthralgias, visual complaints, headache, numbness weakness or ataxia. ? ?   ?Objective:  ? Physical Exam ? ? ?Gen. Pleasant, obese, in no distress ?ENT - no lesions, no post nasal drip ?Neck: No JVD, no thyromegaly, no carotid bruits ?Lungs: no use of accessory muscles, no dullness to percussion, decreased without rales or rhonchi  ?Cardiovascular: Rhythm regular, heart sounds  normal, no murmurs or gallops, no peripheral edema ?Musculoskeletal: No deformities, no cyanosis or clubbing , no tremors ?Ambulates with cane ? ? ? ?   ?Assessment & Plan:  ? ? ?

## 2021-08-29 NOTE — Assessment & Plan Note (Signed)
Sleep abdominal was reviewed which shows good control of events on 10 cm and excellent compliance more than 8 hours every night.  She has a mild leak and we discussed ways to adjust the mask ? ?Weight loss encouraged, compliance with goal of at least 4-6 hrs every night is the expectation. ?Advised against medications with sedative side effects ?Cautioned against driving when sleepy - understanding that sleepiness will vary on a day to day basis ? ?

## 2021-09-02 ENCOUNTER — Other Ambulatory Visit: Payer: Self-pay | Admitting: Family Medicine

## 2021-09-02 ENCOUNTER — Telehealth: Payer: Self-pay | Admitting: Family Medicine

## 2021-09-02 DIAGNOSIS — M62830 Muscle spasm of back: Secondary | ICD-10-CM

## 2021-09-02 MED ORDER — DIAZEPAM 5 MG PO TABS
ORAL_TABLET | ORAL | 3 refills | Status: DC
Start: 2021-09-04 — End: 2022-03-07

## 2021-09-02 NOTE — Telephone Encounter (Signed)
Patient called in for refill on  ? ?diazepam (VALIUM) 5 MG tablet  ?

## 2021-09-02 NOTE — Telephone Encounter (Signed)
Requests refill. Pls advise  ?

## 2021-09-03 DIAGNOSIS — M5416 Radiculopathy, lumbar region: Secondary | ICD-10-CM | POA: Diagnosis not present

## 2021-09-03 DIAGNOSIS — R0609 Other forms of dyspnea: Secondary | ICD-10-CM | POA: Diagnosis not present

## 2021-09-06 ENCOUNTER — Other Ambulatory Visit (HOSPITAL_COMMUNITY): Payer: Self-pay | Admitting: Family Medicine

## 2021-09-06 ENCOUNTER — Other Ambulatory Visit: Payer: Self-pay | Admitting: Family Medicine

## 2021-09-06 ENCOUNTER — Other Ambulatory Visit: Payer: Self-pay | Admitting: Pulmonary Disease

## 2021-09-06 DIAGNOSIS — N6320 Unspecified lump in the left breast, unspecified quadrant: Secondary | ICD-10-CM

## 2021-09-06 DIAGNOSIS — R928 Other abnormal and inconclusive findings on diagnostic imaging of breast: Secondary | ICD-10-CM

## 2021-09-07 ENCOUNTER — Other Ambulatory Visit: Payer: Self-pay

## 2021-09-07 ENCOUNTER — Telehealth: Payer: Self-pay

## 2021-09-07 DIAGNOSIS — M62830 Muscle spasm of back: Secondary | ICD-10-CM

## 2021-09-07 MED ORDER — CYCLOBENZAPRINE HCL 10 MG PO TABS: 10.0000 mg | ORAL_TABLET | Freq: Three times a day (TID) | ORAL | 1 refills | Status: DC

## 2021-09-07 NOTE — Telephone Encounter (Signed)
Patient called need med refill ? ?cyclobenzaprine (FLEXERIL) 10 MG tablet  ? ?Pharmacy: [facility] [place] ?

## 2021-09-07 NOTE — Progress Notes (Signed)
Med refilled.

## 2021-09-07 NOTE — Telephone Encounter (Signed)
Dr. Alva, please advise if you are okay refilling med. 

## 2021-09-17 ENCOUNTER — Other Ambulatory Visit: Payer: Self-pay | Admitting: Nurse Practitioner

## 2021-09-20 ENCOUNTER — Ambulatory Visit: Payer: Medicare Other | Admitting: Nurse Practitioner

## 2021-09-23 ENCOUNTER — Encounter: Payer: Self-pay | Admitting: Nurse Practitioner

## 2021-09-23 ENCOUNTER — Ambulatory Visit (INDEPENDENT_AMBULATORY_CARE_PROVIDER_SITE_OTHER): Payer: Medicare Other | Admitting: Nurse Practitioner

## 2021-09-23 ENCOUNTER — Encounter: Payer: Self-pay | Admitting: Family Medicine

## 2021-09-23 ENCOUNTER — Ambulatory Visit (INDEPENDENT_AMBULATORY_CARE_PROVIDER_SITE_OTHER): Payer: Medicare Other | Admitting: Family Medicine

## 2021-09-23 VITALS — BP 124/78 | HR 78 | Resp 16 | Ht 64.0 in | Wt 314.0 lb

## 2021-09-23 VITALS — BP 134/78 | HR 76 | Ht 64.0 in | Wt 316.0 lb

## 2021-09-23 DIAGNOSIS — E038 Other specified hypothyroidism: Secondary | ICD-10-CM

## 2021-09-23 DIAGNOSIS — R52 Pain, unspecified: Secondary | ICD-10-CM

## 2021-09-23 DIAGNOSIS — E1159 Type 2 diabetes mellitus with other circulatory complications: Secondary | ICD-10-CM

## 2021-09-23 DIAGNOSIS — E1165 Type 2 diabetes mellitus with hyperglycemia: Secondary | ICD-10-CM

## 2021-09-23 DIAGNOSIS — F322 Major depressive disorder, single episode, severe without psychotic features: Secondary | ICD-10-CM

## 2021-09-23 DIAGNOSIS — E782 Mixed hyperlipidemia: Secondary | ICD-10-CM

## 2021-09-23 DIAGNOSIS — I5032 Chronic diastolic (congestive) heart failure: Secondary | ICD-10-CM

## 2021-09-23 DIAGNOSIS — G4733 Obstructive sleep apnea (adult) (pediatric): Secondary | ICD-10-CM | POA: Diagnosis not present

## 2021-09-23 DIAGNOSIS — R002 Palpitations: Secondary | ICD-10-CM | POA: Diagnosis not present

## 2021-09-23 DIAGNOSIS — E039 Hypothyroidism, unspecified: Secondary | ICD-10-CM | POA: Diagnosis not present

## 2021-09-23 DIAGNOSIS — I10 Essential (primary) hypertension: Secondary | ICD-10-CM

## 2021-09-23 DIAGNOSIS — E559 Vitamin D deficiency, unspecified: Secondary | ICD-10-CM | POA: Diagnosis not present

## 2021-09-23 LAB — POCT GLYCOSYLATED HEMOGLOBIN (HGB A1C): HbA1c, POC (controlled diabetic range): 9.5 % — AB (ref 0.0–7.0)

## 2021-09-23 MED ORDER — GLIPIZIDE ER 5 MG PO TB24: 5.0000 mg | ORAL_TABLET | Freq: Every day | ORAL | 3 refills | Status: DC

## 2021-09-23 NOTE — Patient Instructions (Signed)
Diabetes Mellitus and Foot Care Foot care is an important part of your health, especially when you have diabetes. Diabetes may cause you to have problems because of poor blood flow (circulation) to your feet and legs, which can cause your skin to: Become thinner and drier. Break more easily. Heal more slowly. Peel and crack. You may also have nerve damage (neuropathy) in your legs and feet, causing decreased feeling in them. This means that you may not notice minor injuries to your feet that could lead to more serious problems. Noticing and addressing any potential problems early is the best way to prevent future foot problems. How to care for your feet Foot hygiene  Wash your feet daily with warm water and mild soap. Do not use hot water. Then, pat your feet and the areas between your toes until they are completely dry. Do not soak your feet as this can dry your skin. Trim your toenails straight across. Do not dig under them or around the cuticle. File the edges of your nails with an emery board or nail file. Apply a moisturizing lotion or petroleum jelly to the skin on your feet and to dry, brittle toenails. Use lotion that does not contain alcohol and is unscented. Do not apply lotion between your toes. Shoes and socks Wear clean socks or stockings every day. Make sure they are not too tight. Do not wear knee-high stockings since they may decrease blood flow to your legs. Wear shoes that fit properly and have enough cushioning. Always look in your shoes before you put them on to be sure there are no objects inside. To break in new shoes, wear them for just a few hours a day. This prevents injuries on your feet. Wounds, scrapes, corns, and calluses  Check your feet daily for blisters, cuts, bruises, sores, and redness. If you cannot see the bottom of your feet, use a mirror or ask someone for help. Do not cut corns or calluses or try to remove them with medicine. If you find a minor scrape,  cut, or break in the skin on your feet, keep it and the skin around it clean and dry. You may clean these areas with mild soap and water. Do not clean the area with peroxide, alcohol, or iodine. If you have a wound, scrape, corn, or callus on your foot, look at it several times a day to make sure it is healing and not infected. Check for: Redness, swelling, or pain. Fluid or blood. Warmth. Pus or a bad smell. General tips Do not cross your legs. This may decrease blood flow to your feet. Do not use heating pads or hot water bottles on your feet. They may burn your skin. If you have lost feeling in your feet or legs, you may not know this is happening until it is too late. Protect your feet from hot and cold by wearing shoes, such as at the beach or on hot pavement. Schedule a complete foot exam at least once a year (annually) or more often if you have foot problems. Report any cuts, sores, or bruises to your health care provider immediately. Where to find more information American Diabetes Association: www.diabetes.org Association of Diabetes Care & Education Specialists: www.diabeteseducator.org Contact a health care provider if: You have a medical condition that increases your risk of infection and you have any cuts, sores, or bruises on your feet. You have an injury that is not healing. You have redness on your legs or feet. You   feel burning or tingling in your legs or feet. You have pain or cramps in your legs and feet. Your legs or feet are numb. Your feet always feel cold. You have pain around any toenails. Get help right away if: You have a wound, scrape, corn, or callus on your foot and: You have pain, swelling, or redness that gets worse. You have fluid or blood coming from the wound, scrape, corn, or callus. Your wound, scrape, corn, or callus feels warm to the touch. You have pus or a bad smell coming from the wound, scrape, corn, or callus. You have a fever. You have a red  line going up your leg. Summary Check your feet every day for blisters, cuts, bruises, sores, and redness. Apply a moisturizing lotion or petroleum jelly to the skin on your feet and to dry, brittle toenails. Wear shoes that fit properly and have enough cushioning. If you have foot problems, report any cuts, sores, or bruises to your health care provider immediately. Schedule a complete foot exam at least once a year (annually) or more often if you have foot problems. This information is not intended to replace advice given to you by your health care provider. Make sure you discuss any questions you have with your health care provider. Document Revised: 11/06/2019 Document Reviewed: 11/06/2019 Elsevier Patient Education  2023 [employer]  

## 2021-09-23 NOTE — Patient Instructions (Addendum)
F/U IN 3 MONTHS CALL IGF YOU NEED ME SOONER  yOU WILL BE REFERRED TO cARDIOLOGY RE NEW ONSET PALPITATIONS  FASTING LIPID, CBC, VIT D 1 WEEK BEFORE NEXT   MEDICATIONS AS BEFORE  CONTINUE TO AVOID SWEETS , AND STARCHES , BLOOD SUGAR CONTROL WILL IMPROVE WITH NEW TESTING SUPPLY  Thanks for choosing [facility], we consider it a privelige to serve you.

## 2021-09-23 NOTE — Progress Notes (Signed)
09/23/2021, 11:03 AM                    Endocrinology follow-up note   Subjective:    [name], female    DOB: [birth year].  [name] is being seen in follow-up in the management of currently uncontrolled symptomatic type 2 diabetes.  She also has comorbid dyslipidemia, hypertension.   PMD:  Fayrene Helper, MD.   Past Medical History:  Diagnosis Date   Allergy    Phreesia 06/02/2020   Anxiety    Arthritis    Asthma    Asthma    Phreesia 04/12/2020   Breast mass    left (monitor)   CHF (congestive heart failure) ([place])    Phreesia 04/12/2020   Coronary atherosclerosis of native coronary artery    Mild nonobstructive CAD by cardiac catheterization 2008 - Dr. Terrence Dupont   Degenerative disc disease    Depression    Depression    Phreesia 04/12/2020   Diabetes mellitus without complication ([place])    Phreesia 04/12/2020   Dysrhythmia    Essential hypertension, benign    Folliculitis 39/53/2023   GERD (gastroesophageal reflux disease)    H/O hiatal hernia    Helicobacter pylori gastritis 2008   Hypercholesterolemia    Hypertension    Hyperthyroidism    s/p radiation   Left-sided epistaxis 05/15/2018   Low back pain    Migraine    Nephrolithiasis    Recurring episodes since 2004   Neuromuscular disorder ([place])    Phreesia 04/12/2020   Severe obesity (BMI >= 40) (HCC)    Sleep apnea    STOP BANG SCORE 6no cpap used   Thyroid disease    Phreesia 04/12/2020   Type 2 diabetes mellitus with diabetic neuropathy ([place])     Past Surgical History:  Procedure Laterality Date   [place]  2000 [facility]   INT HEMORRHOIDS/FISSURE   COLONOSCOPY  2003 [facility], CHANGE IN BOWEL HABITS   INT HEMORRHOIDS   COLONOSCOPY  2006 [facility]   INT HEMORRHOIDS    COLONOSCOPY  2007 [facility]   INT HEMORRHOIDS   CYSTOSCOPY WITH STENT PLACEMENT Left 07/25/2012   Procedure: CYSTOSCOPY, left retograde pyelogram WITH left ureteral  STENT PLACEMENT;  Surgeon: Claybon Jabs, MD;  Location: [facility];  Service: Urology;  Laterality: Left;   CYSTOSCOPY/RETROGRADE/URETEROSCOPY  12/04/2011   Procedure: CYSTOSCOPY/RETROGRADE/URETEROSCOPY;  Surgeon: Molli Hazard, MD;  Location: [facility];  Service: Urology;  Laterality: Right;   CYSTOSCOPY/RETROGRADE/URETEROSCOPY/STONE EXTRACTION WITH BASKET Left 09/03/2012   Procedure: CYSTOSCOPY/RETROGRADE pyelogram/digital URETEROSCOPY/STONE EXTRACTION WITH BASKET, left stent removal;  Surgeon: Molli Hazard, MD;  Location: [facility];  Service: Urology;  Laterality: Left;   ESOPHAGOGASTRODUODENOSCOPY  2008   XID:HWYSH hiatal hernia./Normal esophagus without evidence of Barrett's mass, stricture, erosion or ulceration./Normal duodenal bulb and second portion of the duodenum./Diffuse erythema in the body and the antrum with occasional erosion.  Biopsies obtained via cold forceps to evaluate for H. pylori gastritis   FRACTURE SURGERY  1999   right clavicle   HOLMIUM LASER APPLICATION Left 10/07/3727   Procedure: HOLMIUM LASER APPLICATION;  Surgeon: Molli Hazard, MD;  Location: [facility];  Service: Urology;  Laterality: Left;   KNEE ARTHROSCOPY  10/04/2010,    right knee arthroscopy, dr Theda Sers   LEFT HEART CATH AND CORONARY ANGIOGRAPHY N/A 05/08/2018   Procedure: LEFT HEART CATH AND CORONARY ANGIOGRAPHY;  Surgeon: Troy Sine, MD;  Location: [place] [facility];  Service: Cardiovascular;  Laterality: N/A;   left knee arthroscopic surgery  07/31/97   Left salphingectomy secondary to ectopic pregnancy  [place] CATH N/A 07/07/2019   Procedure: RIGHT HEART CATH;  Surgeon: Leonie Man, MD;  Location: [place] [facility];  Service: Cardiovascular;  Laterality: N/A;   rotary cuff  Right  05/14/2014   [facility]   SPINE SURGERY N/A    Phreesia 06/02/2020   UPPER GASTROINTESTINAL ENDOSCOPY  08-01-2006 abd pain   H. pylori gastritis   UPPER GASTROINTESTINAL ENDOSCOPY  1996 [place]   PUD    Social History   Socioeconomic History   Marital status: Single    Spouse name: Not on file   Number of children: 1   Years of education: 12   Highest education level: 12th grade  Occupational History   Occupation: Disable   Tobacco Use   Smoking status: Never   Smokeless tobacco: Never  Vaping Use   Vaping Use: Never used  Substance and Sexual Activity   Alcohol use: No   Drug use: No   Sexual activity: Not Currently    Partners: Male    Birth control/protection: Surgical    Comment: hyst  Other Topics Concern   Not on file  Social History Narrative   Lives alone    Social Determinants of Health   Financial Resource Strain: Low Risk    Difficulty of Paying Living Expenses: Not very hard  Food Insecurity: No Food Insecurity   Worried About Charity fundraiser in the Last Year: Never true   Ran Out of Food in the Last Year: Never true  Transportation Needs: No Transportation Needs   Lack of Transportation (Medical): No   Lack of Transportation (Non-Medical): No  Physical Activity: Sufficiently Active   Days of Exercise per Week: 5 days   Minutes of Exercise per Session: 30 min  Stress: Not on file  Social Connections: Socially Isolated   Frequency of Communication with Friends and Family: More than three times a week   Frequency of Social Gatherings with Friends and Family: More than three times a week   Attends Religious Services: Never   Marine scientist or Organizations: No   Attends Music therapist: Never   Marital Status: Divorced    Family History  Problem Relation Age of Onset   Heart disease Mother    Hypertension Mother    Cancer Mother        Cervical    Asthma Mother    Heart attack Father    Cancer Father        Prostate    Colon cancer Father        DECEASED AGE 08/01/59   Colon polyps Neg Hx     Outpatient Encounter Medications as of 09/23/2021  Medication Sig   glipiZIDE (GLUCOTROL XL) 5 MG 24 hr tablet Take 1 tablet (5 mg total) by mouth daily with breakfast.   albuterol (VENTOLIN HFA) 108 (90 Base) MCG/ACT inhaler Inhale 2 puffs into the lungs every 6 (six) hours as needed for wheezing or shortness of breath.   amLODipine (NORVASC) 10 MG tablet TAKE  1 TABLET BY MOUTH EVERY DAY   B-D UF III MINI PEN NEEDLES 31G X 5 MM MISC 2 (two) times daily. as directed   betamethasone, augmented, (DIPROLENE) 0.05 % lotion Apply 1 application. topically 2 (two) times daily.   blood glucose meter kit and supplies Dispense based on patient and insurance preference. Once daily testing dx. e11.9   calcipotriene (DOVONOX) 0.005 % ointment Apply 1 application topically daily.    clobetasol cream (TEMOVATE) 3.29 % Apply 1 application. topically 2 (two) times daily.   cloNIDine (CATAPRES - DOSED IN MG/24 HR) 0.3 mg/24hr patch APPLY 1 PATCH TO SKIN ONCE A WEEK   cloNIDine (CATAPRES) 0.3 MG tablet TAKE 1 TABLET (0.3 MG TOTAL) BY MOUTH AT BEDTIME.   cyclobenzaprine (FLEXERIL) 10 MG tablet Take 1 tablet (10 mg total) by mouth 3 (three) times daily.   diazepam (VALIUM) 5 MG tablet Take one tablet by mouth three times dalyh for spasm   DULoxetine (CYMBALTA) 60 MG capsule TAKE 1 CAPSULE BY MOUTH 2 TIMES DAILY.   ergocalciferol (VITAMIN D2) 1.25 MG (50000 UT) capsule Take 1 capsule (50,000 Units total) by mouth once a week. One capsule once weekly   esomeprazole (NEXIUM) 40 MG capsule TAKE 1 CAPSULE BY MOUTH EVERY DAY   furosemide (LASIX) 40 MG tablet TAKE 1 TABLET BY MOUTH EVERY DAY   gabapentin (NEURONTIN) 800 MG tablet TAKE 1 TABLET BY MOUTH FOUR TIMES A DAY   hydrOXYzine (ATARAX/VISTARIL) 10 MG tablet Take one tablet by mouth art bedtime   Insulin Lispro Prot & Lispro (HUMALOG MIX 75/25 KWIKPEN) (75-25) 100 UNIT/ML Kwikpen Inject 30 Units  into the skin 2 (two) times daily with a meal.   ipratropium-albuterol (DUONEB) 0.5-2.5 (3) MG/3ML SOLN Take 3 mLs by nebulization every 6 (six) hours as needed.   KLOR-CON M20 20 MEQ tablet TAKE 1 TABLET BY MOUTH TWICE A DAY   Lancets (ONETOUCH DELICA PLUS JMEQAS34H) MISC USE TO TEST ONCE DAILY   levothyroxine (SYNTHROID) 75 MCG tablet TAKE 1 TABLET BY MOUTH EVERY DAY   loratadine (CLARITIN) 10 MG tablet TAKE 1 TABLET BY MOUTH EVERY DAY   metFORMIN (GLUCOPHAGE) 500 MG tablet TAKE 1 TABLET BY MOUTH 2 TIMES DAILY WITH A MEAL.   metoprolol tartrate (LOPRESSOR) 25 MG tablet Take 1 tablet (25 mg total) by mouth 2 (two) times daily.   naloxone (NARCAN) nasal spray 4 mg/0.1 mL One spray into each nostril once as needed   nitroGLYCERIN (NITROSTAT) 0.4 MG SL tablet Place 1 tablet (0.4 mg total) under the tongue every 5 (five) minutes as needed for chest pain.   ONETOUCH VERIO test strip USE AS INSTRUCTED FOUR TIMES DAILY TESTING DX E11.65   oxyCODONE (OXYCONTIN) 40 mg 12 hr tablet Take one tablet by mouth two times daily for pain   oxyCODONE (OXYCONTIN) 40 mg 12 hr tablet Take 1 tablet (40 mg total) by mouth every 12 (twelve) hours.   spironolactone (ALDACTONE) 25 MG tablet TAKE 1 TABLET (25 MG TOTAL) BY MOUTH DAILY.   SYMBICORT 160-4.5 MCG/ACT inhaler Inhale 2 puffs into the lungs in the morning and at bedtime.   UNABLE TO FIND BD Ultra-Fine Mini Pen Needle 31 gauge x 3/16"  USE AS DIRECTED TWICE A DAY   UNABLE TO FIND Symbicort 160 mcg-4.5 mcg/actuation HFA aerosol inhaler  TAKE 2 PUFFS BY MOUTH TWICE A DAY   UNABLE TO FIND OneTouch Verio test strips  USE AS INSTRUCTED FOUR TIMES DAILY TESTING DX E11.65   UNABLE TO FIND  OneTouch Delica Plus Lancet 33 gauge  USE TO TEST ONCE DAILY   zolpidem (AMBIEN) 10 MG tablet zolpidem 10 mg tablet  TAKE 1 TABLET BY MOUTH EVERYDAY AT BEDTIME   [DISCONTINUED] benzonatate (TESSALON) 100 MG capsule benzonatate 100 mg capsule  TAKE 1 CAPSULE BY MOUTH 2 TIMES DAILY  AS NEEDED FOR COUGH.   [DISCONTINUED] diazepam (VALIUM) 5 MG tablet Take one tablet by mouth three times daily for spasm   [DISCONTINUED] fluconazole (DIFLUCAN) 150 MG tablet fluconazole 150 mg tablet  TAKE ONE TAB STAT. TAKE THE SECOND TAB ON THE THIRD DAY IF ISSUE ISN'T RESOLVED   [DISCONTINUED] glipiZIDE (GLUCOTROL XL) 10 MG 24 hr tablet TAKE 1 TABLET BY MOUTH EVERY DAY WITH BREAKFAST   No facility-administered encounter medications on file as of 09/23/2021.    ALLERGIES: Allergies  Allergen Reactions   Latex Itching and Rash   Morphine Other (See Comments)    Reaction with esophagus, unable to swallow.    Morphine And Related Shortness Of Breath and Swelling   Other Itching, Shortness Of Breath and Swelling   Ace Inhibitors Cough   Aspirin Other (See Comments)    Reaction with esophagus, unable to swallow.    Losartan Cough   Tapentadol Other (See Comments)    Nausea, increased sleepiness, h/a   Tomato Other (See Comments)    Acid reflux due to acid in tomato   Adhesive [Tape] Rash    VACCINATION STATUS: Immunization History  Administered Date(s) Administered   Influenza Split 03/30/2011   Influenza Whole 03/21/2005, 02/09/2006   Influenza,inj,Quad PF,6+ Mos 02/11/2014, 07/27/2015, 03/09/2016, 01/29/2017, 02/07/2018, 01/30/2019, 02/11/2020, 03/03/2021   Influenza-Unspecified 01/29/2017   PFIZER(Purple Top)SARS-COV-2 Vaccination 07/22/2019, 08/12/2019, 03/05/2020, 11/19/2020   Pneumococcal Polysaccharide-23 04/17/2006, 09/14/2015   Td 04/17/2006    Diabetes She presents for her follow-up diabetic visit. She has type 2 diabetes mellitus. Her disease course has been stable. Hypoglycemia symptoms include nervousness/anxiousness, sweats and tremors. Pertinent negatives for hypoglycemia include no confusion, headaches, pallor or seizures. Associated symptoms include fatigue. Pertinent negatives for diabetes include no blurred vision, no chest pain, no polydipsia, no polyphagia,  no polyuria and no weight loss. There are no hypoglycemic complications. Symptoms are stable. Diabetic complications include heart disease, nephropathy and peripheral neuropathy. Risk factors for coronary artery disease include dyslipidemia, diabetes mellitus, family history, hypertension, obesity, sedentary lifestyle, tobacco exposure and post-menopausal. Current diabetic treatment includes insulin injections and oral agent (dual therapy). She is compliant with treatment most of the time. Her weight is fluctuating minimally. She is following a generally healthy diet. When asked about meal planning, she reported none. She has had a previous visit with a dietitian. She never participates in exercise. Her home blood glucose trend is decreasing steadily. Her breakfast blood glucose range is generally 140-180 mg/dl. Her bedtime blood glucose range is generally 180-200 mg/dl. Her overall blood glucose range is 140-180 mg/dl. (She presents today, accompanied by her daughter, with her meter and logs showing slightly above target fasting and at goal postprandial readings.  Her POCT A1c today is 9.5%, increasing slightly from last visit of 9.2%.  She does note she has frequent drops in glucose between injecting insulin and skips her dinner dose sometimes if glucose is around 120.  Analysis of her meter shows 7-day average of 177, 14-day average of 187, 30-day average of 185, 90-day average of 180.) An ACE inhibitor/angiotensin II receptor blocker is contraindicated. She does not see a podiatrist.Eye exam is not current.  Hyperlipidemia This is a chronic  problem. The current episode started more than 1 year ago. The problem is controlled. Recent lipid tests were reviewed and are normal. Exacerbating diseases include chronic renal disease, diabetes, hypothyroidism and obesity. Factors aggravating her hyperlipidemia include beta blockers and thiazides. Pertinent negatives include no chest pain, myalgias or shortness of  breath. Current antihyperlipidemic treatment includes statins. The current treatment provides mild improvement of lipids. Compliance problems include adherence to diet and adherence to exercise.  Risk factors for coronary artery disease include diabetes mellitus, dyslipidemia, hypertension, obesity and a sedentary lifestyle.  Hypertension This is a chronic problem. The current episode started more than 1 year ago. The problem has been gradually improving since onset. The problem is controlled. Associated symptoms include peripheral edema and sweats. Pertinent negatives include no blurred vision, chest pain, headaches, palpitations or shortness of breath. Agents associated with hypertension include thyroid hormones. Risk factors for coronary artery disease include family history, dyslipidemia, diabetes mellitus, obesity, sedentary lifestyle and post-menopausal state. Past treatments include calcium channel blockers, diuretics and beta blockers. The current treatment provides moderate improvement. Compliance problems include diet and exercise.  Hypertensive end-organ damage includes kidney disease, CAD/MI and heart failure. Identifiable causes of hypertension include chronic renal disease and sleep apnea.   Review of systems  Constitutional: + minimally fluctuating body weight,  current Body mass index is 54.24 kg/m. , + fatigue, no subjective hyperthermia, no subjective hypothermia Eyes: no blurry vision, no xerophthalmia ENT: no sore throat, no nodules palpated in throat, no dysphagia/odynophagia, no hoarseness Cardiovascular: no chest pain, no shortness of breath, no palpitations, no leg swelling Respiratory: no cough, no SOB Gastrointestinal: no nausea/vomiting/diarrhea Musculoskeletal: no muscle/joint aches, walks with cane d/t hx of falls Skin: no rashes, no hyperemia Neurological: no tremors, no numbness, no tingling, no dizziness Psychiatric: no depression, no anxiety,    Objective:    BP  134/78   Pulse 76   Ht $R'5\' 4"'CP$  (1.626 m)   Wt (!) 316 lb (143.3 kg)   BMI 54.24 kg/m   Wt Readings from Last 3 Encounters:  09/23/21 (!) 314 lb (142.4 kg)  09/23/21 (!) 316 lb (143.3 kg)  08/29/21 (!) 316 lb 3.2 oz (143.4 kg)    BP Readings from Last 3 Encounters:  09/23/21 124/78  09/23/21 134/78  08/29/21 128/78     Physical Exam- Limited  Constitutional:  Body mass index is 54.24 kg/m. , not in acute distress, normal state of mind Eyes:  EOMI, no exophthalmos Neck: Supple Cardiovascular: RRR, no murmurs, rubs, or gallops, no edema Respiratory: Adequate breathing efforts, no crackles, rales, rhonchi, or wheezing Musculoskeletal: no gross deformities, strength intact in all four extremities, no gross restriction of joint movements, walks with cane due to disequilibrium and hx of falls Skin:  no rashes, no hyperemia Neurological: no tremor with outstretched hands   CMP ( most recent) CMP     Component Value Date/Time   NA 141 06/09/2021 1219   K 4.6 06/09/2021 1219   CL 99 06/09/2021 1219   CO2 26 06/09/2021 1219   GLUCOSE 154 (H) 06/09/2021 1219   GLUCOSE 316 (H) 02/11/2020 1111   BUN 18 06/09/2021 1219   CREATININE 0.91 06/09/2021 1219   CREATININE 0.91 02/11/2020 1111   CALCIUM 9.7 06/09/2021 1219   PROT 7.9 07/01/2021 1133   ALBUMIN 4.9 (H) 07/01/2021 1133   AST 22 07/01/2021 1133   ALT 28 07/01/2021 1133   ALKPHOS 86 07/01/2021 1133   BILITOT <0.2 07/01/2021 1133   GFRNONAA 70 05/06/2020  [place] 02/11/2020 1111   GFRAA 80 05/06/2020 1201   GFRAA 80 02/11/2020 1111    Diabetic Labs (most recent): Lab Results  Component Value Date   HGBA1C 9.5 (A) 09/23/2021   HGBA1C 9.2 (A) 06/23/2021   HGBA1C 9.1 (A) 03/23/2021     Lipid Panel ( most recent) Lipid Panel     Component Value Date/Time   CHOL 139 06/09/2021 1219   TRIG 84 06/09/2021 1219   HDL 48 06/09/2021 1219   CHOLHDL 2.9 06/09/2021 1219   CHOLHDL 4.0 11/10/2019 1303   VLDL 16  05/06/2018 0335   LDLCALC 75 06/09/2021 1219   LDLCALC 97 11/10/2019 1303      Assessment & Plan:   1) Uncontrolled type 2 diabetes mellitus with hyperglycemia ([place])  - Angela [name] has currently uncontrolled symptomatic type 2 DM since  61 years of age.  She presents today, accompanied by her daughter, with her meter and logs showing slightly above target fasting and at goal postprandial readings.  Her POCT A1c today is 9.5%, increasing slightly from last visit of 9.2%.  She does note she has frequent drops in glucose between injecting insulin and skips her dinner dose sometimes if glucose is around 120.  Analysis of her meter shows 7-day average of 177, 14-day average of 187, 30-day average of 185, 90-day average of 180.   Recent labs reviewed.  -her diabetes is complicated by coronary artery disease, CHF, obesity/sedentary life and she remains at a high risk for more acute and chronic complications which include CAD, CVA, CKD, retinopathy, and neuropathy. These are all discussed in detail with her.  The following Lifestyle Medicine recommendations according to [facility] were discussed and offered to patient and she agrees to start the journey:  A. Whole Foods, Plant-based plate comprising of fruits and vegetables, plant-based proteins, whole-grain carbohydrates was discussed in detail with the patient.   A list for source of those nutrients were also provided to the patient.  Patient will use only water or unsweetened tea for hydration. B.  The need to stay away from risky substances including alcohol, smoking; obtaining 7 to 9 hours of restorative sleep, at least 150 minutes of moderate intensity exercise weekly, the importance of healthy social connections,  and stress reduction techniques were discussed. C.  A full color page of  Calorie density of various food groups per pound showing examples of each food groups was provided to the patient.  -  Nutritional counseling repeated at each appointment due to patients tendency to fall back in to old habits.  - The patient admits there is a room for improvement in their diet and drink choices. -  Suggestion is made for the patient to avoid simple carbohydrates from their diet including Cakes, Sweet Desserts / Pastries, Ice Cream, Soda (diet and regular), Sweet Tea, Candies, Chips, Cookies, Sweet Pastries, Store Bought Juices, Alcohol in Excess of 1-2 drinks a day, Artificial Sweeteners, Coffee Creamer, and "Sugar-free" Products. This will help patient to have stable blood glucose profile and potentially avoid unintended weight gain.   - I encouraged the patient to switch to unprocessed or minimally processed complex starch and increased protein intake (animal or plant source), fruits, and vegetables.   - Patient is advised to stick to a routine mealtimes to eat 3 meals a day and avoid unnecessary snacks (to snack only to correct hypoglycemia).  - I have approached her with the following individualized plan to manage  diabetes and patient agrees:   -Given her presentation with chronic glycemic burden, she will likely require multiple daily injections of insulin in order for her to achieve control of diabetes to target.  -She is advised to continue Humalog 75/25 30 units SQ twice daily with breakfast and supper.  She can continue her Metformin 500 mg po twice daily and lower her Glipizide to 5 mg XL daily with breakfast.  We discussed eating more fruits and veggies and eating less processed foods.  -She is encouraged to consistently monitor glucose 3 times daily, before injecting insulin and before bed, and to call the clinic if she has readings less than 70 or greater than 300 for 3 tests in a row.  She could greatly benefit from CGM device, especially given her account of low readings between insulin injections.  I sent in for Dexcom to [facility].  - she is warned not to take insulin without proper  monitoring per orders.  2) Blood Pressure /Hypertension: Her blood pressure is controlled to target.  She is advised to continue Norvasc 10 mg po daily, Clonidine 0.3 mg patch once weekly, Clonidine 0.3 mg po daily at bedtime, Lasix 40 mg po daily, Spironolactone 25 mg po daily, and Metoprolol 25 mg po twice daily.  3) Lipids/Hyperlipidemia:  Her most recent lipid panel from 06/09/21 shows controlled LDL at 75.  She is advised to continue Crestor 5 mg po every other day at bedtime.    4)  Weight/Diet:  Her Body mass index is 54.24 kg/m. - clearly complicating her diabetes care.  She is a candidate for modest weight loss.  She has multiple comorbid situations complicating her chance of bariatric surgery.  I discussed with her the fact that loss of 5 - 10% of her  current body weight will have the most impact on her diabetes management.  CDE Consult will be initiated . Exercise, and detailed carbohydrates information provided  -  detailed on discharge instructions.  5) Hypothyroidism:   -There are no recent TFTs to review.   She is advised to continue Levothyroxine 75 mcg po daily before breakfast. Will recheck TFTs prior to next visit and adjust dose if needed.   - We discussed about the correct intake of her thyroid hormone, on empty stomach at fasting, with water, separated by at least 30 minutes from breakfast and other medications,  and separated by more than 4 hours from calcium, iron, multivitamins, acid reflux medications (PPIs). -Patient is made aware of the fact that thyroid hormone replacement is needed for life, dose to be adjusted by periodic monitoring of thyroid function tests.  6) Chronic Care/Health Maintenance: -she is not on ACE/ARB (intolerant to both) is on statin medications and is encouraged to initiate and continue to follow up with Ophthalmology, Dentist,  Podiatrist at least yearly or according to recommendations, and advised to  stay away from smoking. I have recommended  yearly flu vaccine and pneumonia vaccine at least every 5 years; moderate intensity exercise for up to 150 minutes weekly; and  sleep for at least 7 hours a day.  - she is advised to maintain close follow up with Fayrene Helper, MD for primary care needs, as well as her other providers for optimal and coordinated care.     I spent 40 minutes in the care of the patient today including review of labs from [place], Lipids, Thyroid Function, Hematology (current and previous including abstractions from other facilities); face-to-face time discussing  her blood glucose  readings/logs, discussing hypoglycemia and hyperglycemia episodes and symptoms, medications doses, her options of short and long term treatment based on the latest standards of care / guidelines;  discussion about incorporating lifestyle medicine;  and documenting the encounter.    Please refer to Patient Instructions for Blood Glucose Monitoring and Insulin/Medications Dosing Guide"  in media tab for additional information. Please  also refer to " Patient Self Inventory" in the Media  tab for reviewed elements of pertinent patient history.  Angela [name] participated in the discussions, expressed understanding, and voiced agreement with the above plans.  All questions were answered to her satisfaction. she is encouraged to contact clinic should she have any questions or concerns prior to her return visit.   Follow up plan: - Return in about 3 months (around 12/24/2021) for Diabetes F/U with A1c in office, Thyroid follow up, Previsit labs, Bring meter and logs.    Rayetta Pigg, [facility] [address] [place], [place] [address] Phone: [phone] Fax: [phone]   09/23/2021, 11:03 AM

## 2021-09-26 ENCOUNTER — Encounter: Payer: Self-pay | Admitting: Family Medicine

## 2021-09-26 DIAGNOSIS — R002 Palpitations: Secondary | ICD-10-CM | POA: Insufficient documentation

## 2021-09-26 MED ORDER — OXYCODONE HCL ER 40 MG PO T12A: EXTENDED_RELEASE_TABLET | ORAL | 0 refills | Status: DC

## 2021-09-26 MED ORDER — ZOLPIDEM TARTRATE 10 MG PO TABS: 10.0000 mg | ORAL_TABLET | Freq: Every day | ORAL | 2 refills | Status: DC

## 2021-09-26 NOTE — Assessment & Plan Note (Signed)
  Patient re-educated about  the importance of commitment to a  minimum of 150 minutes of exercise per week as able.  The importance of healthy food choices with portion control discussed, as well as eating regularly and within a 12 hour window most days. The need to choose "clean , green" food 50 to 75% of the time is discussed, as well as to make water the primary drink and set a goal of 64 ounces water daily.       09/23/2021   10:24 AM 09/23/2021    9:06 AM 08/29/2021   11:17 AM  Weight /BMI  Weight 314 lb 316 lb 316 lb 3.2 oz  Height '5\' 4"'$  (1.626 m) '5\' 4"'$  (1.626 m) '5\' 4"'$  (1.626 m)  BMI 53.9 kg/m2 54.24 kg/m2 54.28 kg/m2

## 2021-09-26 NOTE — Assessment & Plan Note (Signed)
Compliant with treatment

## 2021-09-26 NOTE — Progress Notes (Signed)
Angela [name]     MRN: [id number]      DOB: 07/28/1960   HPI Angela [name] is here for follow up and re-evaluation of chronic medical conditions, medication management and review of any available recent lab and radiology data.  Preventive health is updated, specifically  Cancer screening and Immunization.   Questions or concerns regarding consultations or procedures which the PT has had in the interim are  addressed. The PT denies any adverse reactions to current medications since the last visit.  C/o new onset palpitations in past monht, denies chest pain or increased fatigue Now on a step program through Pulmonary to improve lung function  ROS Denies recent fever or chills. Denies sinus pressure, nasal congestion, ear pain or sore throat. Denies chest congestion, productive cough or wheezing. Denies abdominal pain, nausea, vomiting,diarrhea or constipation.   Denies dysuria, frequency, hesitancy or incontinence.  Chronic joint pain, swelling and limitation in mobility. Denies headaches, seizures, numbness, or tingling. Denies depression, anxiety or insomnia. Denies skin break down or rash.   PE  BP 124/78   Pulse 78   Resp 16   Ht '5\' 4"'$  (1.626 m)   Wt (!) 314 lb (142.4 kg)   BMI 53.90 kg/m   Patient alert and oriented and in no cardiopulmonary distress.  HEENT: No facial asymmetry, EOMI,     Neck supple .  Chest: Clear to auscultation bilaterally.  CVS: S1, S2 no murmurs, no S3.Regular rate.  ABD: Soft non tender.   Ext: No edema  MS: decreased  ROM spine, shoulders, hips and knees.  Skin: Intact, no ulcerations or rash noted.  Psych: Good eye contact, normal affect. Memory intact not anxious or depressed appearing.  CNS: CN 2-12 intact, power,  normal throughout.no focal deficits noted.   Assessment & Plan  Uncontrolled type 2 diabetes mellitus with hyperglycemia ([place]) Angela [name] is reminded of the importance of commitment to daily physical activity for 30  minutes or more, as able and the need to limit carbohydrate intake to 30 to 60 grams per meal to help with blood sugar control.   The need to take medication as prescribed, test blood sugar as directed, and to call between visits if there is a concern that blood sugar is uncontrolled is also discussed.   Angela [name] is reminded of the importance of daily foot exam, annual eye examination, and good blood sugar, blood pressure and cholesterol control.     Latest Ref Rng & Units 09/23/2021    9:16 AM 06/23/2021    2:01 PM 06/09/2021   12:19 PM 03/23/2021    1:55 PM 03/17/2021    8:56 AM  Diabetic Labs  HbA1c 0.0 - 7.0 % 9.5   9.2    9.1     Chol 100 - 199 mg/dL   139      HDL >39 mg/dL   48      Calc LDL 0 - 99 mg/dL   75      Triglycerides 0 - 149 mg/dL   84      Creatinine 0.57 - 1.00 mg/dL   0.91    0.81        09/23/2021   10:24 AM 09/23/2021    9:06 AM 08/29/2021   11:17 AM 07/13/2021   10:56 AM 07/01/2021   10:14 AM 06/23/2021    1:50 PM 06/09/2021   11:42 AM  BP/Weight  Systolic BP 716 967 893 810 175 102 585  Diastolic BP 78 78  78 78 77 75 73  Wt. (Lbs) 314 316 316.2 325 320 317 321  BMI 53.9 kg/m2 54.24 kg/m2 54.28 kg/m2 55.79 kg/m2 54.93 kg/m2 54.41 kg/m2 55.1 kg/m2      Latest Ref Rng & Units 01/19/2021   12:00 AM 02/11/2020    9:40 AM  Foot/eye exam completion dates  Eye Exam No Retinopathy No Retinopathy        Foot Form Completion   Done     This result is from an external source.      Uncontrolled, managed by Endo  OSA (obstructive sleep apnea) Compliant with treatment  Morbid obesity ([place])  Patient re-educated about  the importance of commitment to a  minimum of 150 minutes of exercise per week as able.  The importance of healthy food choices with portion control discussed, as well as eating regularly and within a 12 hour window most days. The need to choose "clean , green" food 50 to 75% of the time is discussed, as well as to make water the primary drink and  set a goal of 64 ounces water daily.       09/23/2021   10:24 AM 09/23/2021    9:06 AM 08/29/2021   11:17 AM  Weight /BMI  Weight 314 lb 316 lb 316 lb 3.2 oz  Height '5\' 4"'$  (1.626 m) '5\' 4"'$  (1.626 m) '5\' 4"'$  (1.626 m)  BMI 53.9 kg/m2 54.24 kg/m2 54.28 kg/m2      Hypothyroidism Controlled, no change in medication   Encounter for pain management The patient's Controlled Substance registry is reviewed and compliance confirmed. Adequacy of  Pain control and level of function is assessed. Medication dosing is adjusted as deemed appropriate. Twelve weeks of medication is prescribed , with a follow up appointment between 11 to 12 weeks .   Depression, [name], single episode, severe ([place]) Getting therapy , improving , continue same meds  Essential hypertension, benign Controlled, no change in medication DASH diet and commitment to daily physical activity for a minimum of 30 minutes discussed and encouraged, as a part of hypertension management. The importance of attaining a healthy weight is also discussed.     09/23/2021   10:24 AM 09/23/2021    9:06 AM 08/29/2021   11:17 AM 07/13/2021   10:56 AM 07/01/2021   10:14 AM 06/23/2021    1:50 PM 06/09/2021   11:42 AM  BP/Weight  Systolic BP 553 748 270 786 754 492 010  Diastolic BP 78 78 78 78 77 75 73  Wt. (Lbs) 314 316 316.2 325 320 317 321  BMI 53.9 kg/m2 54.24 kg/m2 54.28 kg/m2 55.79 kg/m2 54.93 kg/m2 54.41 kg/m2 55.1 kg/m2       Chronic diastolic heart failure (HCC) notr decompensated, stable

## 2021-09-26 NOTE — Assessment & Plan Note (Signed)
notr decompensated, stable

## 2021-09-26 NOTE — Assessment & Plan Note (Signed)
Controlled, no change in medication DASH diet and commitment to daily physical activity for a minimum of 30 minutes discussed and encouraged, as a part of hypertension management. The importance of attaining a healthy weight is also discussed.     09/23/2021   10:24 AM 09/23/2021    9:06 AM 08/29/2021   11:17 AM 07/13/2021   10:56 AM 07/01/2021   10:14 AM 06/23/2021    1:50 PM 06/09/2021   11:42 AM  BP/Weight  Systolic BP 143 888 757 972 820 601 561  Diastolic BP 78 78 78 78 77 75 73  Wt. (Lbs) 314 316 316.2 325 320 317 321  BMI 53.9 kg/m2 54.24 kg/m2 54.28 kg/m2 55.79 kg/m2 54.93 kg/m2 54.41 kg/m2 55.1 kg/m2

## 2021-09-26 NOTE — Assessment & Plan Note (Signed)
Getting therapy , improving , continue same meds

## 2021-09-26 NOTE — Assessment & Plan Note (Signed)
The patient's Controlled Substance registry is reviewed and compliance confirmed. Adequacy of  Pain control and level of function is assessed. Medication dosing is adjusted as deemed appropriate. Twelve weeks of medication is prescribed , with a follow up appointment between 11 to 12 weeks .  

## 2021-09-26 NOTE — Assessment & Plan Note (Signed)
Controlled, no change in medication  

## 2021-09-26 NOTE — Assessment & Plan Note (Signed)
New onset palpitations in past 4 weeks, primarily at bedtime, refer to Cardiology for eval

## 2021-09-26 NOTE — Assessment & Plan Note (Signed)
Angela [name] is reminded of the importance of commitment to daily physical activity for 30 minutes or more, as able and the need to limit carbohydrate intake to 30 to 60 grams per meal to help with blood sugar control.   The need to take medication as prescribed, test blood sugar as directed, and to call between visits if there is a concern that blood sugar is uncontrolled is also discussed.   Angela [name] is reminded of the importance of daily foot exam, annual eye examination, and good blood sugar, blood pressure and cholesterol control.     Latest Ref Rng & Units 09/23/2021    9:16 AM 06/23/2021    2:01 PM 06/09/2021   12:19 PM 03/23/2021    1:55 PM 03/17/2021    8:56 AM  Diabetic Labs  HbA1c 0.0 - 7.0 % 9.5   9.2    9.1     Chol 100 - 199 mg/dL   139      HDL >39 mg/dL   48      Calc LDL 0 - 99 mg/dL   75      Triglycerides 0 - 149 mg/dL   84      Creatinine 0.57 - 1.00 mg/dL   0.91    0.81        09/23/2021   10:24 AM 09/23/2021    9:06 AM 08/29/2021   11:17 AM 07/13/2021   10:56 AM 07/01/2021   10:14 AM 06/23/2021    1:50 PM 06/09/2021   11:42 AM  BP/Weight  Systolic BP 201 007 121 975 883 254 982  Diastolic BP 78 78 78 78 77 75 73  Wt. (Lbs) 314 316 316.2 325 320 317 321  BMI 53.9 kg/m2 54.24 kg/m2 54.28 kg/m2 55.79 kg/m2 54.93 kg/m2 54.41 kg/m2 55.1 kg/m2      Latest Ref Rng & Units 01/19/2021   12:00 AM 02/11/2020    9:40 AM  Foot/eye exam completion dates  Eye Exam No Retinopathy No Retinopathy        Foot Form Completion   Done     This result is from an external source.      Uncontrolled, managed by Endo

## 2021-09-27 DIAGNOSIS — E1165 Type 2 diabetes mellitus with hyperglycemia: Secondary | ICD-10-CM | POA: Diagnosis not present

## 2021-09-28 ENCOUNTER — Telehealth: Payer: Self-pay | Admitting: Internal Medicine

## 2021-09-28 ENCOUNTER — Ambulatory Visit: Payer: Medicare Other

## 2021-09-28 DIAGNOSIS — R002 Palpitations: Secondary | ICD-10-CM

## 2021-09-28 NOTE — Telephone Encounter (Signed)
Patient c/o Palpitations:  High priority if patient c/o lightheadedness, shortness of breath, or chest pain  How long have you had palpitations/irregular HR/ Afib? About a month and a half  Are you having the symptoms now? no  Are you currently experiencing lightheadedness, SOB or CP? Not at the time of call.  Only during a hard coughing spell sometimes she feels like she is going to pass out   Do you have a history of afib (atrial fibrillation) or irregular heart rhythm?   Have you checked your BP or HR? (document readings if available):   124/78  Are you experiencing any other symptoms? Heart flutter   Flutter occurs normally from ~ 7 pm-10 pm. The patient notices it when she sits in her recliner or when she lays down to go to bed. The patient does not want to see an APP , she only wants to see Dr. Margaretann Loveless

## 2021-09-28 NOTE — Telephone Encounter (Signed)
Returned call to patient of Dr. Margaretann Loveless who expressed concern about palpitations and fluttering for 1.5 months. When she coughs, she feels like she could pass out. She also reports a dull, deep pain when she coughs - reports no wheezing - uses inhaler/oxygen. Flutter occurs normally from ~ 7 pm-10 pm. The patient notices it when she sits in her recliner or when she lays down to go to bed. She sleeps on 4 pillows. She lays on her right side and notices fluttering. She does not fall asleep on her left side as this causes her to cough. Patient did not think symptoms were serious so she didn't call us initially, but discussed with PCP on 5/26.   Advised will send message to Dr. Margaretann Loveless to review

## 2021-09-28 NOTE — Telephone Encounter (Signed)
Spoke with patient about MD recommendations for testing. She agreed w/plan. Echo and monitor ordered. Staff message sent to Markus Daft about monitor and scheduling team about echo/follow up

## 2021-09-28 NOTE — Progress Notes (Unsigned)
Enrolled patient for a 14 day Zio XT monitor to be mailed to patients home   [id number] mailed to patient/ applied in office.

## 2021-09-28 NOTE — Telephone Encounter (Signed)
Elouise Munroe, MD  You; Clinton Gallant, Belinda Block, RN 25 minutes ago (12:49 PM)   Pls order 2 week zio and echo - and can see me or APP after results.

## 2021-10-04 ENCOUNTER — Telehealth: Payer: Self-pay | Admitting: Pulmonary Disease

## 2021-10-04 DIAGNOSIS — R0609 Other forms of dyspnea: Secondary | ICD-10-CM | POA: Diagnosis not present

## 2021-10-04 DIAGNOSIS — G4733 Obstructive sleep apnea (adult) (pediatric): Secondary | ICD-10-CM

## 2021-10-04 NOTE — Telephone Encounter (Signed)
Called and spoke to patient and notified that order was placed. Nothing further needed at this time.

## 2021-10-05 DIAGNOSIS — G4733 Obstructive sleep apnea (adult) (pediatric): Secondary | ICD-10-CM | POA: Diagnosis not present

## 2021-10-06 ENCOUNTER — Telehealth: Payer: Self-pay | Admitting: Internal Medicine

## 2021-10-06 ENCOUNTER — Telehealth: Payer: Self-pay

## 2021-10-06 NOTE — Telephone Encounter (Signed)
Appointment scheduled, Monday, 10/10/21, 1:00 PM, to have ZIO XT monitor applied at [facility] office.

## 2021-10-06 NOTE — Telephone Encounter (Signed)
Called patient to pick up physician verification form. Patient will pick up form Friday 10/06/2021

## 2021-10-06 NOTE — Telephone Encounter (Signed)
Patient would like an appointment to have her heart monitor placed.

## 2021-10-20 ENCOUNTER — Other Ambulatory Visit (HOSPITAL_COMMUNITY): Payer: Medicare Other

## 2021-10-20 ENCOUNTER — Encounter (HOSPITAL_COMMUNITY): Payer: Medicare Other

## 2021-10-20 DIAGNOSIS — R002 Palpitations: Secondary | ICD-10-CM | POA: Diagnosis not present

## 2021-10-21 ENCOUNTER — Ambulatory Visit (HOSPITAL_COMMUNITY): Payer: Medicare Other | Attending: Cardiology

## 2021-10-21 DIAGNOSIS — I358 Other nonrheumatic aortic valve disorders: Secondary | ICD-10-CM | POA: Insufficient documentation

## 2021-10-21 DIAGNOSIS — R002 Palpitations: Secondary | ICD-10-CM | POA: Diagnosis not present

## 2021-10-21 DIAGNOSIS — I119 Hypertensive heart disease without heart failure: Secondary | ICD-10-CM | POA: Diagnosis not present

## 2021-10-21 DIAGNOSIS — E785 Hyperlipidemia, unspecified: Secondary | ICD-10-CM | POA: Insufficient documentation

## 2021-10-21 DIAGNOSIS — I517 Cardiomegaly: Secondary | ICD-10-CM | POA: Diagnosis not present

## 2021-10-21 DIAGNOSIS — E119 Type 2 diabetes mellitus without complications: Secondary | ICD-10-CM | POA: Diagnosis not present

## 2021-10-21 LAB — ECHOCARDIOGRAM COMPLETE
Area-P 1/2: 3.42 cm2
S' Lateral: 3 cm

## 2021-10-24 ENCOUNTER — Other Ambulatory Visit: Payer: Self-pay | Admitting: Nurse Practitioner

## 2021-10-24 DIAGNOSIS — E1159 Type 2 diabetes mellitus with other circulatory complications: Secondary | ICD-10-CM

## 2021-10-28 DIAGNOSIS — E1165 Type 2 diabetes mellitus with hyperglycemia: Secondary | ICD-10-CM | POA: Diagnosis not present

## 2021-11-03 DIAGNOSIS — M5416 Radiculopathy, lumbar region: Secondary | ICD-10-CM | POA: Diagnosis not present

## 2021-11-03 DIAGNOSIS — R0609 Other forms of dyspnea: Secondary | ICD-10-CM | POA: Diagnosis not present

## 2021-11-04 ENCOUNTER — Other Ambulatory Visit: Payer: Self-pay | Admitting: Family Medicine

## 2021-11-04 DIAGNOSIS — M62830 Muscle spasm of back: Secondary | ICD-10-CM

## 2021-11-15 ENCOUNTER — Ambulatory Visit (HOSPITAL_COMMUNITY)
Admission: RE | Admit: 2021-11-15 | Discharge: 2021-11-15 | Disposition: A | Discharge status: Home / Self Care | Payer: Medicare Other | Source: Ambulatory Visit | Attending: Family Medicine | Admitting: Family Medicine

## 2021-11-15 ENCOUNTER — Encounter (HOSPITAL_COMMUNITY): Payer: Self-pay

## 2021-11-15 DIAGNOSIS — N6320 Unspecified lump in the left breast, unspecified quadrant: Secondary | ICD-10-CM

## 2021-11-15 DIAGNOSIS — R928 Other abnormal and inconclusive findings on diagnostic imaging of breast: Secondary | ICD-10-CM | POA: Diagnosis not present

## 2021-11-16 ENCOUNTER — Other Ambulatory Visit: Payer: Self-pay | Admitting: Family Medicine

## 2021-11-19 ENCOUNTER — Other Ambulatory Visit: Payer: Self-pay | Admitting: Family Medicine

## 2021-11-20 ENCOUNTER — Other Ambulatory Visit: Payer: Self-pay | Admitting: Family Medicine

## 2021-11-21 ENCOUNTER — Telehealth: Payer: Self-pay | Admitting: Nurse Practitioner

## 2021-11-21 NOTE — Telephone Encounter (Signed)
Patient said please send her a link to start this

## 2021-11-21 NOTE — Telephone Encounter (Signed)
Pt is bringing receiver here so we can print readings

## 2021-11-21 NOTE — Telephone Encounter (Signed)
I dont have her connected on either [place] or Dexcom

## 2021-11-21 NOTE — Telephone Encounter (Signed)
Pt said please check her CGM/Dexcom G7. She is dropping down in the 40's around 9 pm at night. She said right now her sugar is 245

## 2021-11-27 DIAGNOSIS — E1165 Type 2 diabetes mellitus with hyperglycemia: Secondary | ICD-10-CM | POA: Diagnosis not present

## 2021-11-28 ENCOUNTER — Ambulatory Visit: Payer: Medicare Other | Admitting: Podiatry

## 2021-11-28 ENCOUNTER — Ambulatory Visit (INDEPENDENT_AMBULATORY_CARE_PROVIDER_SITE_OTHER): Payer: Medicare Other | Admitting: Podiatry

## 2021-11-28 ENCOUNTER — Encounter: Payer: Self-pay | Admitting: Podiatry

## 2021-11-28 DIAGNOSIS — E1142 Type 2 diabetes mellitus with diabetic polyneuropathy: Secondary | ICD-10-CM | POA: Diagnosis not present

## 2021-11-28 DIAGNOSIS — Q828 Other specified congenital malformations of skin: Secondary | ICD-10-CM

## 2021-11-28 DIAGNOSIS — M79674 Pain in right toe(s): Secondary | ICD-10-CM

## 2021-11-28 DIAGNOSIS — L84 Corns and callosities: Secondary | ICD-10-CM

## 2021-11-28 DIAGNOSIS — M79675 Pain in left toe(s): Secondary | ICD-10-CM | POA: Diagnosis not present

## 2021-11-28 DIAGNOSIS — B351 Tinea unguium: Secondary | ICD-10-CM

## 2021-12-02 NOTE — Progress Notes (Signed)
  Subjective:  [name], female    DOB: Aug 13, 1960,  MRN: [id number]  [name] presents to clinic today for at risk foot care with history of diabetic neuropathy and painful porokeratotic lesions right lower extremity. Pain prevent(s) comfortable ambulation. Aggravating factor is weightbearing with and without shoegear.  Last A1c was 9.8%.  Patient did not check blood glucose today.  New problem(s): None.   PCP is Fayrene Helper, MD , and last visit was  Sep 23, 2021  Allergies  Allergen Reactions   Latex Itching and Rash   Morphine Other (See Comments)    Reaction with esophagus, unable to swallow.    Morphine And Related Shortness Of Breath and Swelling   Other Itching, Shortness Of Breath and Swelling   Ace Inhibitors Cough   Aspirin Other (See Comments)    Reaction with esophagus, unable to swallow.    Losartan Cough   Tapentadol Other (See Comments)    Nausea, increased sleepiness, h/a   Tomato Other (See Comments)    Acid reflux due to acid in tomato   Adhesive [Tape] Rash    Review of Systems: Negative except as noted in the HPI.  Objective: No changes noted in today's physical examination.  General: Patient is a pleasant 61 y.o. African American female morbidly obese in NAD. AAO x 3.   Neurovascular Examination: CFT immediate b/l LE. Palpable DP/PT pulses b/l LE. Digital hair sparse b/l. Skin temperature gradient WNL b/l. No pain with calf compression b/l. No edema noted b/l. No cyanosis or clubbing noted b/l LE.  Pt has subjective symptoms of neuropathy. Protective sensation decreased with 10 gram monofilament b/l.  Dermatological:  Pedal integument with normal turgor, texture and tone b/l LE. No open wounds b/l. No interdigital macerations b/l. Toenails 1-5 b/l elongated, thickened, discolored with subungual debris. +Tenderness with dorsal palpation of nailplates. Porokeratotic lesion(s) noted submet head 5 left foot and plantar aspect of  right heel and plantarlateral midfoot RLE.  Musculoskeletal:  Muscle strength 5/5 to all lower extremity muscle groups bilaterally. HAV with bunion deformity noted b/l LE. Utilizes cane for ambulation assistance.     Latest Ref Rng & Units 09/23/2021    9:16 AM 06/23/2021    2:01 PM 03/23/2021    1:55 PM 12/21/2020   10:02 AM  Hemoglobin A1C  Hemoglobin-A1c 0.0 - 7.0 % 9.5  9.2  9.1  8.9    Assessment/Plan: 1. Pain due to onychomycosis of toenails of both feet   2. Callus   3. Porokeratosis   4. Diabetic peripheral neuropathy associated with type 2 diabetes mellitus (HCC)      -Examined patient. -Toenails 1-5 b/l were debrided in length and girth with sterile nail nippers and dremel without iatrogenic bleeding.  -Callus(es) submet head 5 left foot pared utilizing mandrel sander without complication or incident. Total number debrided =1. -Porokeratotic lesion(s) plantar heel pad of right foot and plantarlateral aspect of midfoot right foot pared and enucleated with sterile scalpel blade without incident. Total number of lesions debrided=2. -Patient/POA to call should there be question/concern in the interim.   Return in about 3 months (around 02/28/2022).  Marzetta Board, DPM

## 2021-12-04 DIAGNOSIS — R0609 Other forms of dyspnea: Secondary | ICD-10-CM | POA: Diagnosis not present

## 2021-12-07 ENCOUNTER — Other Ambulatory Visit: Payer: Self-pay | Admitting: Family Medicine

## 2021-12-08 ENCOUNTER — Telehealth: Payer: Self-pay

## 2021-12-08 NOTE — Telephone Encounter (Signed)
Patient called need med refill completely out  gabapentin (NEURONTIN) 800 MG tablet  Pharmacy: [facility]

## 2021-12-09 ENCOUNTER — Other Ambulatory Visit: Payer: Self-pay

## 2021-12-09 MED ORDER — GABAPENTIN 800 MG PO TABS: 800.0000 mg | ORAL_TABLET | Freq: Four times a day (QID) | ORAL | 3 refills | Status: DC

## 2021-12-09 NOTE — Telephone Encounter (Signed)
Refill sent.

## 2021-12-21 ENCOUNTER — Other Ambulatory Visit: Payer: Self-pay | Admitting: Nurse Practitioner

## 2021-12-27 ENCOUNTER — Ambulatory Visit: Payer: Medicare Other | Admitting: Nurse Practitioner

## 2021-12-28 DIAGNOSIS — E1165 Type 2 diabetes mellitus with hyperglycemia: Secondary | ICD-10-CM | POA: Diagnosis not present

## 2021-12-29 ENCOUNTER — Encounter: Payer: Self-pay | Admitting: Family Medicine

## 2021-12-29 ENCOUNTER — Ambulatory Visit (INDEPENDENT_AMBULATORY_CARE_PROVIDER_SITE_OTHER): Payer: Medicare Other | Admitting: Family Medicine

## 2021-12-29 VITALS — BP 124/84 | HR 80 | Resp 18 | Ht 64.0 in | Wt 312.0 lb

## 2021-12-29 DIAGNOSIS — E559 Vitamin D deficiency, unspecified: Secondary | ICD-10-CM | POA: Diagnosis not present

## 2021-12-29 DIAGNOSIS — R1902 Left upper quadrant abdominal swelling, mass and lump: Secondary | ICD-10-CM

## 2021-12-29 DIAGNOSIS — Z794 Long term (current) use of insulin: Secondary | ICD-10-CM

## 2021-12-29 DIAGNOSIS — Z23 Encounter for immunization: Secondary | ICD-10-CM

## 2021-12-29 DIAGNOSIS — R109 Unspecified abdominal pain: Secondary | ICD-10-CM | POA: Insufficient documentation

## 2021-12-29 DIAGNOSIS — R1012 Left upper quadrant pain: Secondary | ICD-10-CM | POA: Diagnosis not present

## 2021-12-29 DIAGNOSIS — E1140 Type 2 diabetes mellitus with diabetic neuropathy, unspecified: Secondary | ICD-10-CM

## 2021-12-29 DIAGNOSIS — R52 Pain, unspecified: Secondary | ICD-10-CM | POA: Diagnosis not present

## 2021-12-29 DIAGNOSIS — I10 Essential (primary) hypertension: Secondary | ICD-10-CM

## 2021-12-29 DIAGNOSIS — R1032 Left lower quadrant pain: Secondary | ICD-10-CM | POA: Diagnosis not present

## 2021-12-29 DIAGNOSIS — E782 Mixed hyperlipidemia: Secondary | ICD-10-CM | POA: Diagnosis not present

## 2021-12-29 MED ORDER — OXYCODONE HCL ER 40 MG PO T12A
40.0000 mg | EXTENDED_RELEASE_TABLET | Freq: Two times a day (BID) | ORAL | 0 refills | Status: DC
Start: 2022-01-07 — End: 2022-06-26

## 2021-12-29 MED ORDER — OXYCODONE HCL ER 40 MG PO T12A: 40.0000 mg | EXTENDED_RELEASE_TABLET | Freq: Two times a day (BID) | ORAL | 0 refills | Status: DC

## 2021-12-29 MED ORDER — OXYCODONE HCL ER 40 MG PO T12A: 40.0000 mg | EXTENDED_RELEASE_TABLET | Freq: Two times a day (BID) | ORAL | 0 refills | Status: AC

## 2021-12-29 NOTE — Patient Instructions (Addendum)
F/U in 12 weeks, call if  you need me sooner  Keep appt for annual exam and please bring all meds you are taking to that visit  Flu vaccine today  PLease add hBA1C to labs she is getting  today which have already been ordered  Think about what you will eat, plan ahead. Choose " clean, green, fresh or frozen" over canned, processed or packaged foods which are more sugary, salty and fatty. 70 to 75% of food eaten should be vegetables and fruit. Three meals at set times with snacks allowed between meals, but they must be fruit or vegetables. Aim to eat over a 12 hour period , example 7 am to 7 pm, and STOP after  your last meal of the day. Drink water,generally about 64 ounces per day, no other drink is as healthy. Fruit juice is best enjoyed in a healthy way, by EATING the fruit.   You are referred for scan of your abdomen due to pain and swelling  Thanks for choosing [facility], we consider it a privelige to serve you.

## 2021-12-29 NOTE — Assessment & Plan Note (Signed)
Controlled, no change in medication DASH diet and commitment to daily physical activity for a minimum of 30 minutes discussed and encouraged, as a part of hypertension management. The importance of attaining a healthy weight is also discussed.     12/29/2021   10:56 AM 12/29/2021   10:32 AM 09/23/2021   10:24 AM 09/23/2021    9:06 AM 08/29/2021   11:17 AM 07/13/2021   10:56 AM 07/01/2021   10:14 AM  BP/Weight  Systolic BP 820 601 561 537 943 276 147  Diastolic BP 84 81 78 78 78 78 77  Wt. (Lbs)  312 314 316 316.2 325 320  BMI  53.55 kg/m2 53.9 kg/m2 54.24 kg/m2 54.28 kg/m2 55.79 kg/m2 54.93 kg/m2

## 2021-12-29 NOTE — Assessment & Plan Note (Signed)
Angela [name] is reminded of the importance of commitment to daily physical activity for 30 minutes or more, as able and the need to limit carbohydrate intake to 30 to 60 grams per meal to help with blood sugar control.   The need to take medication as prescribed, test blood sugar as directed, and to call between visits if there is a concern that blood sugar is uncontrolled is also discussed.   Angela [name] is reminded of the importance of daily foot exam, annual eye examination, and good blood sugar, blood pressure and cholesterol control.     Latest Ref Rng & Units 09/23/2021    9:16 AM 06/23/2021    2:01 PM 06/09/2021   12:19 PM 03/23/2021    1:55 PM 03/17/2021    8:56 AM  Diabetic Labs  HbA1c 0.0 - 7.0 % 9.5  9.2   9.1    Chol 100 - 199 mg/dL   139     HDL >39 mg/dL   48     Calc LDL 0 - 99 mg/dL   75     Triglycerides 0 - 149 mg/dL   84     Creatinine 0.57 - 1.00 mg/dL   0.91   0.81       12/29/2021   10:56 AM 12/29/2021   10:32 AM 09/23/2021   10:24 AM 09/23/2021    9:06 AM 08/29/2021   11:17 AM 07/13/2021   10:56 AM 07/01/2021   10:14 AM  BP/Weight  Systolic BP 740 814 481 856 314 970 263  Diastolic BP 84 81 78 78 78 78 77  Wt. (Lbs)  312 314 316 316.2 325 320  BMI  53.55 kg/m2 53.9 kg/m2 54.24 kg/m2 54.28 kg/m2 55.79 kg/m2 54.93 kg/m2      Latest Ref Rng & Units 01/19/2021   12:00 AM 02/11/2020    9:40 AM  Foot/eye exam completion dates  Eye Exam No Retinopathy No Retinopathy       Foot Form Completion   Done     This result is from an external source.      Manage by Endo Updated lab needed at/ before next visit.

## 2021-12-29 NOTE — Progress Notes (Signed)
Angela [name]     MRN: [id number]      DOB: March 14, 1961   HPI Angela [name] is here for follow up and re-evaluation of chronic medical conditions, medication management and review of any available recent lab and radiology data.  Preventive health is updated, specifically  Cancer screening and Immunization.   Questions or concerns regarding consultations or procedures which the PT has had in the interim are  addressed. The PT denies any adverse reactions to current medications since the last visit.  6 week h/o left upper and lower quadrant pain and swelling Blood sugar still uncontrolled, fluctuates a lot, still working on food choice ROS Denies recent fever or chills. Denies sinus pressure, nasal congestion, ear pain or sore throat. Denies chest congestion, productive cough or wheezing. Denies chest pains, palpitations and leg swelling .   Denies dysuria, frequency, hesitancy or incontinence. Chronic  joint pain, swelling and limitation in mobility. Denies headaches, seizures, numbness, or tingling. Denies uncontrolled  depression, anxiety or insomnia. Denies skin break down or rash.   PE  BP 124/84   Pulse 80   Resp 18   Ht '5\' 4"'$  (1.626 m)   Wt (!) 312 lb (141.5 kg)   SpO2 94%   BMI 53.55 kg/m   Patient alert and oriented and in no cardiopulmonary distress.  HEENT: No facial asymmetry, EOMI,     Neck supple .  Chest: Clear to auscultation bilaterally.  CVS: S1, S2 no murmur  HAL:PFXTK tender in LUQ and LLQ tender.  Assymetric with left fullness  Ext: No edema MS: decreased  ROM spine, shoulders, hips and knees.  Skin: Intact, no ulcerations or rash noted.  Psych: Good eye contact, normal affect. Memory intact not anxious or depressed appearing.  CNS: CN 2-12 intact, power,  normal throughout.no focal deficits noted.   Assessment & Plan  Encounter for pain management The patient's Controlled Substance registry is reviewed and compliance confirmed. Adequacy of   Pain control and level of function is assessed. Medication dosing is adjusted as deemed appropriate. Twelve weeks of medication is prescribed , with a follow up appointment between 11 to 12 weeks .   DM (diabetes mellitus) ([place]) Angela [name] is reminded of the importance of commitment to daily physical activity for 30 minutes or more, as able and the need to limit carbohydrate intake to 30 to 60 grams per meal to help with blood sugar control.   The need to take medication as prescribed, test blood sugar as directed, and to call between visits if there is a concern that blood sugar is uncontrolled is also discussed.   Angela [name] is reminded of the importance of daily foot exam, annual eye examination, and good blood sugar, blood pressure and cholesterol control.     Latest Ref Rng & Units 09/23/2021    9:16 AM 06/23/2021    2:01 PM 06/09/2021   12:19 PM 03/23/2021    1:55 PM 03/17/2021    8:56 AM  Diabetic Labs  HbA1c 0.0 - 7.0 % 9.5  9.2   9.1    Chol 100 - 199 mg/dL   139     HDL >39 mg/dL   48     Calc LDL 0 - 99 mg/dL   75     Triglycerides 0 - 149 mg/dL   84     Creatinine 0.57 - 1.00 mg/dL   0.91   0.81       12/29/2021   10:56 AM 12/29/2021  10:32 AM 09/23/2021   10:24 AM 09/23/2021    9:06 AM 08/29/2021   11:17 AM 07/13/2021   10:56 AM 07/01/2021   10:14 AM  BP/Weight  Systolic BP 270 350 093 818 299 371 696  Diastolic BP 84 81 78 78 78 78 77  Wt. (Lbs)  312 314 316 316.2 325 320  BMI  53.55 kg/m2 53.9 kg/m2 54.24 kg/m2 54.28 kg/m2 55.79 kg/m2 54.93 kg/m2      Latest Ref Rng & Units 01/19/2021   12:00 AM 02/11/2020    9:40 AM  Foot/eye exam completion dates  Eye Exam No Retinopathy No Retinopathy       Foot Form Completion   Done     This result is from an external source.      Manage by Endo Updated lab needed at/ before next visit.   Abdominal pain Left upper and lower abd pain and tenderness x 2 weeks, no change in BM, reports feeling of fullness, obtain  scan  Morbid obesity ([place])  Patient re-educated about  the importance of commitment to a  minimum of 150 minutes of exercise per week as able.  The importance of healthy food choices with portion control discussed, as well as eating regularly and within a 12 hour window most days. The need to choose "clean , green" food 50 to 75% of the time is discussed, as well as to make water the primary drink and set a goal of 64 ounces water daily.       12/29/2021   10:32 AM 09/23/2021   10:24 AM 09/23/2021    9:06 AM  Weight /BMI  Weight 312 lb 314 lb 316 lb  Height '5\' 4"'$  (1.626 m) '5\' 4"'$  (1.626 m) '5\' 4"'$  (1.626 m)  BMI 53.55 kg/m2 53.9 kg/m2 54.24 kg/m2      Essential hypertension, benign Controlled, no change in medication DASH diet and commitment to daily physical activity for a minimum of 30 minutes discussed and encouraged, as a part of hypertension management. The importance of attaining a healthy weight is also discussed.     12/29/2021   10:56 AM 12/29/2021   10:32 AM 09/23/2021   10:24 AM 09/23/2021    9:06 AM 08/29/2021   11:17 AM 07/13/2021   10:56 AM 07/01/2021   10:14 AM  BP/Weight  Systolic BP 789 381 017 510 258 527 782  Diastolic BP 84 81 78 78 78 78 77  Wt. (Lbs)  312 314 316 316.2 325 320  BMI  53.55 kg/m2 53.9 kg/m2 54.24 kg/m2 54.28 kg/m2 55.79 kg/m2 54.93 kg/m2

## 2021-12-29 NOTE — Assessment & Plan Note (Signed)
Left upper and lower abd pain and tenderness x 2 weeks, no change in BM, reports feeling of fullness, obtain scan

## 2021-12-29 NOTE — Assessment & Plan Note (Signed)
  Patient re-educated about  the importance of commitment to a  minimum of 150 minutes of exercise per week as able.  The importance of healthy food choices with portion control discussed, as well as eating regularly and within a 12 hour window most days. The need to choose "clean , green" food 50 to 75% of the time is discussed, as well as to make water the primary drink and set a goal of 64 ounces water daily.       12/29/2021   10:32 AM 09/23/2021   10:24 AM 09/23/2021    9:06 AM  Weight /BMI  Weight 312 lb 314 lb 316 lb  Height '5\' 4"'$  (1.626 m) '5\' 4"'$  (1.626 m) '5\' 4"'$  (1.626 m)  BMI 53.55 kg/m2 53.9 kg/m2 54.24 kg/m2

## 2021-12-29 NOTE — Assessment & Plan Note (Signed)
The patient's Controlled Substance registry is reviewed and compliance confirmed. Adequacy of  Pain control and level of function is assessed. Medication dosing is adjusted as deemed appropriate. Twelve weeks of medication is prescribed , with a follow up appointment between 11 to 12 weeks .  

## 2021-12-30 ENCOUNTER — Ambulatory Visit: Payer: Medicare Other | Attending: Internal Medicine | Admitting: Internal Medicine

## 2021-12-30 ENCOUNTER — Other Ambulatory Visit: Payer: Self-pay | Admitting: Family Medicine

## 2021-12-30 ENCOUNTER — Encounter: Payer: Self-pay | Admitting: Internal Medicine

## 2021-12-30 VITALS — BP 132/74 | HR 81 | Ht 64.0 in | Wt 312.2 lb

## 2021-12-30 DIAGNOSIS — I10 Essential (primary) hypertension: Secondary | ICD-10-CM | POA: Diagnosis not present

## 2021-12-30 DIAGNOSIS — Z79899 Other long term (current) drug therapy: Secondary | ICD-10-CM

## 2021-12-30 DIAGNOSIS — E7800 Pure hypercholesterolemia, unspecified: Secondary | ICD-10-CM

## 2021-12-30 DIAGNOSIS — I5032 Chronic diastolic (congestive) heart failure: Secondary | ICD-10-CM | POA: Diagnosis not present

## 2021-12-30 DIAGNOSIS — E1159 Type 2 diabetes mellitus with other circulatory complications: Secondary | ICD-10-CM

## 2021-12-30 DIAGNOSIS — R0609 Other forms of dyspnea: Secondary | ICD-10-CM | POA: Diagnosis not present

## 2021-12-30 DIAGNOSIS — R1032 Left lower quadrant pain: Secondary | ICD-10-CM

## 2021-12-30 DIAGNOSIS — R1012 Left upper quadrant pain: Secondary | ICD-10-CM

## 2021-12-30 DIAGNOSIS — R1902 Left upper quadrant abdominal swelling, mass and lump: Secondary | ICD-10-CM

## 2021-12-30 LAB — LIPID PANEL
Chol/HDL Ratio: 5 ratio — ABNORMAL HIGH (ref 0.0–4.4)
Cholesterol, Total: 195 mg/dL (ref 100–199)
HDL: 39 mg/dL — ABNORMAL LOW (ref >39)
LDL Chol Calc (NIH): 124 mg/dL — ABNORMAL HIGH (ref 0–99)
Triglycerides: 182 mg/dL — ABNORMAL HIGH (ref 0–149)
VLDL Cholesterol Cal: 32 mg/dL (ref 5–40)

## 2021-12-30 LAB — CBC
Hematocrit: 40.2 % (ref 34.0–46.6)
Hemoglobin: 12.9 g/dL (ref 11.1–15.9)
MCH: 26.1 pg — ABNORMAL LOW (ref 26.6–33.0)
MCHC: 32.1 g/dL (ref 31.5–35.7)
MCV: 81 fL (ref 79–97)
Platelets: 436 10*3/uL (ref 150–450)
RBC: 4.95 x10E6/uL (ref 3.77–5.28)
RDW: 14.1 % (ref 11.7–15.4)
WBC: 6.3 10*3/uL (ref 3.4–10.8)

## 2021-12-30 LAB — HEMOGLOBIN A1C
Est. average glucose Bld gHb Est-mCnc: 246 mg/dL
Hgb A1c MFr Bld: 10.2 % — ABNORMAL HIGH (ref 4.8–5.6)

## 2021-12-30 LAB — VITAMIN D 25 HYDROXY (VIT D DEFICIENCY, FRACTURES): Vit D, 25-Hydroxy: 24 ng/mL — ABNORMAL LOW (ref 30.0–100.0)

## 2021-12-30 MED ORDER — EZETIMIBE 10 MG PO TABS: 10.0000 mg | ORAL_TABLET | Freq: Every day | ORAL | 3 refills | Status: DC

## 2021-12-30 NOTE — Progress Notes (Signed)
Cardiology Office Note:    Date:  12/30/2021  ID:  Angela [name], DOB 02-07-1961, MRN [id number]  PCP:  Fayrene Helper, MD  Cardiologist:  Elouise Munroe, MD  Electrophysiologist:  None   Referring MD: Fayrene Helper, MD   Chief Complaint/Reason for Referral: Follow up DOE, LE edema  History of Present Illness:    Angela [name] is a 61 y.o. female with a history of HTN, HLD, and diabetes mellitus, here for follow up.    At her last visit, she was accompanied by her daughter Varney Biles and she was feeling really well overall. She provided additional history. She did not require oxygen. She stated she was trying to exercise and eat healthy. She looked to be in excellent spirits. Her mild LE edema was well managed with lasix. Leg cramps improved after she was prescribed valium. No longer required to drink gatorade during the day.   Today:  She appears to be well. She had an appointment with her PCP yesterday, 12/29/2021. Her blood pressure was initially elevated, but upon recheck it was 124/80.  She states that her blood sugar has been dropping very low at night, so her glipizide was decreased from 10 mg to 5 mg.   Additionally, she reports that she feels tenderness when she is laying on her side. An abdomen CT was ordered yesterday.   She was been working on exercising and getting steps in.   As far as her diet, we discussed reducing cholesterol rich foods, including egg yolks. She will also be trying to eat more vegetables, spinach, and egg white omelets.   Of note, she reports that taking Crestor resulted in her having urinary difficulties. Consequently, she stopped taking it.  She denies any palpitations, chest pain, shortness of breath, or peripheral edema. No lightheadedness, headaches, syncope, orthopnea, or PND.   Past Medical History:  Diagnosis Date   Allergy    Phreesia 06/02/2020   Anxiety    Arthritis    Asthma    Asthma    Phreesia 04/12/2020   Breast  mass    left (monitor)   CHF (congestive heart failure) ([place])    Phreesia 04/12/2020   Coronary atherosclerosis of native coronary artery    Mild nonobstructive CAD by cardiac catheterization 2008 - Dr. Terrence Dupont   Degenerative disc disease    Depression    Depression    Phreesia 04/12/2020   Diabetes mellitus without complication ([place])    Phreesia 04/12/2020   Dysrhythmia    Essential hypertension, benign    Folliculitis 18/56/3149   GERD (gastroesophageal reflux disease)    H/O hiatal hernia    Helicobacter pylori gastritis 2008   Hypercholesterolemia    Hypertension    Hyperthyroidism    s/p radiation   Left-sided epistaxis 05/15/2018   Low back pain    Migraine    Nephrolithiasis    Recurring episodes since 2004   Neuromuscular disorder ([place])    Phreesia 04/12/2020   Severe obesity (BMI >= 40) ([facility])    Sleep apnea    STOP BANG SCORE 6no cpap used   Thyroid disease    Phreesia 04/12/2020   Type 2 diabetes mellitus with diabetic neuropathy ([place])     Past Surgical History:  Procedure Laterality Date   [place] RELEASE Left  CHOLECYSTECTOMY  1996   COLONOSCOPY  2000 [facility]   INT HEMORRHOIDS/FISSURE   COLONOSCOPY  2003 [facility], CHANGE IN BOWEL HABITS   INT HEMORRHOIDS   COLONOSCOPY  2006 [facility]   INT HEMORRHOIDS   COLONOSCOPY  2007 [facility]   INT HEMORRHOIDS   CYSTOSCOPY WITH STENT PLACEMENT Left 07/25/2012   Procedure: CYSTOSCOPY, left retograde pyelogram WITH left ureteral  STENT PLACEMENT;  Surgeon: Claybon Jabs, MD;  Location: [facility];  Service: Urology;  Laterality: Left;   CYSTOSCOPY/RETROGRADE/URETEROSCOPY  12/04/2011   Procedure: CYSTOSCOPY/RETROGRADE/URETEROSCOPY;  Surgeon: Molli Hazard, MD;  Location: [facility];  Service: Urology;  Laterality: Right;   CYSTOSCOPY/RETROGRADE/URETEROSCOPY/STONE EXTRACTION WITH BASKET Left  09/03/2012   Procedure: CYSTOSCOPY/RETROGRADE pyelogram/digital URETEROSCOPY/STONE EXTRACTION WITH BASKET, left stent removal;  Surgeon: Molli Hazard, MD;  Location: [facility];  Service: Urology;  Laterality: Left;   ESOPHAGOGASTRODUODENOSCOPY  2008   YDX:AJOIN hiatal hernia./Normal esophagus without evidence of Barrett's mass, stricture, erosion or ulceration./Normal duodenal bulb and second portion of the duodenum./Diffuse erythema in the body and the antrum with occasional erosion.  Biopsies obtained via cold forceps to evaluate for H. pylori gastritis   FRACTURE SURGERY  1999   right clavicle   HOLMIUM LASER APPLICATION Left 12/05/7670   Procedure: HOLMIUM LASER APPLICATION;  Surgeon: Molli Hazard, MD;  Location: [facility];  Service: Urology;  Laterality: Left;   KNEE ARTHROSCOPY  10/04/2010,    right knee arthroscopy, dr Theda Sers   LEFT HEART CATH AND CORONARY ANGIOGRAPHY N/A 05/08/2018   Procedure: LEFT HEART CATH AND CORONARY ANGIOGRAPHY;  Surgeon: Troy Sine, MD;  Location: [facility];  Service: Cardiovascular;  Laterality: N/A;   left knee arthroscopic surgery  1999   Left salphingectomy secondary to ectopic pregnancy  [place] CATH N/A 07/07/2019   Procedure: RIGHT HEART CATH;  Surgeon: Leonie Man, MD;  Location: [facility];  Service: Cardiovascular;  Laterality: N/A;   rotary cuff  Right 05/14/2014   [facility]   SPINE SURGERY N/A    Phreesia 06/02/2020   UPPER GASTROINTESTINAL ENDOSCOPY  2008 abd pain   H. pylori gastritis   UPPER GASTROINTESTINAL ENDOSCOPY  1996 [facility] [place]   PUD    Current Medications: Current Meds  Medication Sig   albuterol (VENTOLIN HFA) 108 (90 Base) MCG/ACT inhaler Inhale 2 puffs into the lungs every 6 (six) hours as needed for wheezing or shortness of breath.   amLODipine (NORVASC) 10 MG tablet Take 1 tablet by mouth daily.   atorvastatin (LIPITOR) 80 MG tablet    B-D UF III MINI PEN  NEEDLES 31G X 5 MM MISC 2 (two) times daily. as directed   betamethasone, augmented, (DIPROLENE) 0.05 % lotion Apply 1 application. topically 2 (two) times daily.   blood glucose meter kit and supplies Dispense based on patient and insurance preference. Once daily testing dx. e11.9   calcipotriene (DOVONOX) 0.005 % ointment Apply 1 application topically daily.    clobetasol cream (TEMOVATE) 0.94 % Apply 1 application. topically 2 (two) times daily.   cloNIDine (CATAPRES - DOSED IN MG/24 HR) 0.3 mg/24hr patch APPLY 1 PATCH TO SKIN ONCE A WEEK   cloNIDine (CATAPRES) 0.3 MG tablet TAKE 1 TABLET (0.3 MG TOTAL) BY MOUTH AT BEDTIME.   diazepam (VALIUM) 5 MG tablet Take one tablet by mouth three times dalyh for spasm   DULoxetine (CYMBALTA) 60 MG capsule TAKE 1 CAPSULE BY MOUTH 2 TIMES  DAILY.   esomeprazole (NEXIUM) 40 MG capsule TAKE 1 CAPSULE BY MOUTH EVERY DAY   fluconazole (DIFLUCAN) 150 MG tablet TAKE ONE TAB STAT. TAKE THE SECOND TAB ON THE THIRD DAY IF ISSUE ISN'T RESOLVED   furosemide (LASIX) 40 MG tablet TAKE 1 TABLET BY MOUTH EVERY DAY   gabapentin (NEURONTIN) 800 MG tablet Take 1 tablet (800 mg total) by mouth 4 (four) times daily.   glipiZIDE (GLUCOTROL XL) 5 MG 24 hr tablet Take 1 tablet (5 mg total) by mouth daily with breakfast.   hydrocortisone (PROCTOSOL HC) 2.5 % rectal cream PLACE 1 APPLICATION RECTALLY 4 (FOUR) TIMES DAILY. FOR 12 DAYS   hydrOXYzine (ATARAX/VISTARIL) 10 MG tablet Take one tablet by mouth art bedtime   Insulin Lispro Prot & Lispro (HUMALOG 75/25 MIX) (75-25) 100 UNIT/ML Kwikpen INJECT 65 UNITS INTO THE SKIN 2 (TWO) TIMES DAILY WITH A MEAL   ipratropium-albuterol (DUONEB) 0.5-2.5 (3) MG/3ML SOLN Take 3 mLs by nebulization every 6 (six) hours as needed.   KLOR-CON M20 20 MEQ tablet TAKE 1 TABLET BY MOUTH TWICE A DAY   levothyroxine (SYNTHROID) 75 MCG tablet Take 1 tablet by mouth daily.   loratadine (CLARITIN) 10 MG tablet Take 1 tablet by mouth daily.   metFORMIN  (GLUCOPHAGE) 500 MG tablet TAKE 1 TABLET BY MOUTH TWICE A DAY WITH MEALS   metoprolol tartrate (LOPRESSOR) 25 MG tablet Take 1 tablet (25 mg total) by mouth 2 (two) times daily.   naloxone (NARCAN) nasal spray 4 mg/0.1 mL One spray into each nostril once as needed   nitroGLYCERIN (NITROSTAT) 0.4 MG SL tablet Place 1 tablet (0.4 mg total) under the tongue every 5 (five) minutes as needed for chest pain.   ONETOUCH VERIO test strip USE AS INSTRUCTED FOUR TIMES DAILY TESTING DX E11.65   oxyCODONE (OXYCONTIN) 40 mg 12 hr tablet Take one tablet by mouth two times daily for pain   oxyCODONE (OXYCONTIN) 40 mg 12 hr tablet Take one tablet by mouth two times daily   oxyCODONE (OXYCONTIN) 40 mg 12 hr tablet Take one tablet by mouth two times daily for pain   oxyCODONE (OXYCONTIN) 40 mg 12 hr tablet Take one tablet by mouth two times daily for pain   oxyCODONE (OXYCONTIN) 40 mg 12 hr tablet Take 1 tablet (40 mg total) by mouth every 12 (twelve) hours.   [START ON 02/06/2022] oxyCODONE (OXYCONTIN) 40 mg 12 hr tablet Take 1 tablet (40 mg total) by mouth every 12 (twelve) hours.   [START ON 03/08/2022] oxyCODONE (OXYCONTIN) 40 mg 12 hr tablet Take 1 tablet (40 mg total) by mouth every 12 (twelve) hours.   spironolactone (ALDACTONE) 25 MG tablet TAKE 1 TABLET (25 MG TOTAL) BY MOUTH DAILY.   SYMBICORT 160-4.5 MCG/ACT inhaler Inhale 2 puffs into the lungs in the morning and at bedtime.   tiZANidine (ZANAFLEX) 2 MG tablet    triamterene-hydrochlorothiazide (MAXZIDE) 75-50 MG tablet    UNABLE TO FIND BD Ultra-Fine Mini Pen Needle 31 gauge x 3/16"  USE AS DIRECTED TWICE A DAY   UNABLE TO FIND Symbicort 160 mcg-4.5 mcg/actuation HFA aerosol inhaler  TAKE 2 PUFFS BY MOUTH TWICE A DAY   UNABLE TO FIND OneTouch Verio test strips  USE AS INSTRUCTED FOUR TIMES DAILY TESTING DX E11.65   UNABLE TO FIND OneTouch Delica Plus Lancet 33 gauge  USE TO TEST ONCE DAILY   zolpidem (AMBIEN) 10 MG tablet zolpidem 10 mg tablet   TAKE 1 TABLET BY MOUTH EVERYDAY AT  BEDTIME   zolpidem (AMBIEN) 10 MG tablet Take 1 tablet (10 mg total) by mouth at bedtime.   [DISCONTINUED] cyclobenzaprine (FLEXERIL) 10 MG tablet Take 1 tablet by mouth 3 (three) times daily.   [DISCONTINUED] ergocalciferol (VITAMIN D2) 1.25 MG (50000 UT) capsule Take 1 capsule (50,000 Units total) by mouth once a week. One capsule once weekly   [DISCONTINUED] ezetimibe (ZETIA) 10 MG tablet Take 1 tablet (10 mg total) by mouth daily.   [DISCONTINUED] Lancets (ONETOUCH DELICA PLUS [id number]) MISC USE TO TEST ONCE DAILY     Allergies:   Latex, Morphine, Morphine and related, Other, Ace inhibitors, Aspirin, Losartan, Tapentadol, Tomato, and Adhesive [tape]   Social History   Tobacco Use   Smoking status: Never   Smokeless tobacco: Never  Vaping Use   Vaping Use: Never used  Substance Use Topics   Alcohol use: No   Drug use: No     Family History: The patient's family history includes Asthma in her mother; Cancer in her father and mother; Colon cancer in her father; Heart attack in her father; Heart disease in her mother; Hypertension in her mother. There is no history of Colon polyps.  ROS:   Please see the history of present illness.    (+) Abdominal tenderness  All other systems reviewed and are negative.  EKGs/Labs/Other Studies Reviewed:    The following studies were reviewed today:  Echo 10/21/2021: 1. Left ventricular ejection fraction, by estimation, is 65 to 70%. The  left ventricle has normal function. The left ventricle has no regional  wall motion abnormalities. There is moderate asymmetric left ventricular  hypertrophy of the basal-septal  segment. Left ventricular diastolic parameters are consistent with Grade I  diastolic dysfunction (impaired relaxation).   2. Right ventricular systolic function is normal. The right ventricular  size is normal.   3. Left atrial size was mildly dilated.   4. The mitral valve is normal in  structure. Trivial mitral valve  regurgitation. No evidence of mitral stenosis.   5. The aortic valve is tricuspid. There is mild calcification of the  aortic valve. Aortic valve regurgitation is not visualized. Aortic valve  sclerosis/calcification is present, without any evidence of aortic  stenosis.   6. The inferior vena cava is normal in size with greater than 50%  respiratory variability, suggesting right atrial pressure of 3 mmHg.    [place] 07/07/2019: No evidence of pulmonary hypertension Relatively normal right heart Pressures with moderately elevated PCWP and right atrial pressures. ->  Numbers do not suggest pulmonary hypertension Hyperdynamic cardiac output however with slightly reduced cardiac index (likely related to obesity).   Recommendations: Suspect noncardiac etiology for dyspnea although diastolic dysfunction component is also possible. We will restart home dose diuretic. Continue CPAP Weight loss  LHC 05/08/2018: Prox LAD to Mid LAD lesion is 10% stenosed. Mid LAD lesion is 20% stenosed.   No significant coronary obstructive disease with mild 10 to 47% mid systolic bridging of the LAD; normal ramus intermediate, left circumflex, and dominant RCA.   LVEDP 14 mm.  Previous echo Doppler study EF 60 to 65%.   RECOMMENDATION: Medical therapy.  Suspect nonischemic etiology to the patient's chest pain.  Recommend weight loss, optimal blood pressure control with target blood pressure less than 130/80.  Lipid-lowering therapy with target LDL less than 70 in this diabetic female.  EKG:  EKG is personally reviewed.  12/30/21: Normal sinus rhythm. Poor R wave progression. 03/29/21: NSR, septal infarct pattern. 10/15/2020: NSR, rate 90 bpm,  septal infarct pattern 04/12/2020: NSR, left axis deviation, poor R wave progression  Recent Labs: 03/17/2021: TSH 3.120 06/09/2021: BUN 18; Creatinine, Ser 0.91; Potassium 4.6; Sodium 141 07/01/2021: ALT 28 12/29/2021: Hemoglobin 12.9; Platelets  436  Recent Lipid Panel    Component Value Date/Time   CHOL 195 12/29/2021 1130   TRIG 182 (H) 12/29/2021 1130   HDL 39 (L) 12/29/2021 1130   CHOLHDL 5.0 (H) 12/29/2021 1130   CHOLHDL 4.0 11/10/2019 1303   VLDL 16 05/06/2018 0335   LDLCALC 124 (H) 12/29/2021 1130   LDLCALC 97 11/10/2019 1303    Physical Exam:    BP 132/74   Pulse 81   Ht $R'5\' 4"'Bu$  (1.626 m)   Wt (!) 312 lb 3.2 oz (141.6 kg)   SpO2 97%   BMI 53.59 kg/m     Wt Readings from Last 5 Encounters:  12/30/21 (!) 312 lb 3.2 oz (141.6 kg)  12/29/21 (!) 312 lb (141.5 kg)  09/23/21 (!) 314 lb (142.4 kg)  09/23/21 (!) 316 lb (143.3 kg)  08/29/21 (!) 316 lb 3.2 oz (143.4 kg)    Constitutional: No acute distress Eyes: sclera non-icteric, normal conjunctiva and lids ENMT: normal dentition, moist mucous membranes Cardiovascular: regular rhythm, normal rate, no murmur. S1 and S2 normal. No jugular venous distention.  Respiratory: clear to auscultation bilaterally GI : normal bowel sounds, soft and nontender. No distention.   MSK: extremities warm, well perfused. Trace bilateral edema.  NEURO: grossly nonfocal exam, moves all extremities. PSYCH: alert and oriented x 3, normal mood and affect.    ASSESSMENT:    1. Hypercholesterolemia   2. Chronic diastolic heart failure ([place])   3. DOE (dyspnea on exertion)   4. Essential hypertension   5. Type 2 diabetes mellitus with vascular disease ([place])   6. Morbid obesity ([place])   7. Medication management      PLAN:    Chronic diastolic HF DOE  - improved with increased activity. Encouraged her to continue her positive steps. Not requiring oxygen today.  - continue daily lasix 40 mg. Cramps improved with valium, per PCP. On Kcl supp.  HLD - she was on crestor 5 mg MWF, since stopped. Will start zetia 10 mg daily.   HTN - BP mildly elevated but stable. Endorses elevated BP in office presenetation that normalizes on recheck. Goal BP is <120/80 with diabetes and  obesity. Continue amlodipine 10 mg daily, clonidine 0.3 mg QHS, lasix 40 mg daily, metoprolol tartrate 25 mg BID, spironolactone 25 mg daily, and maxide 75-50 mg daily.   Total time of encounter: 30 minutes total time of encounter, including 20 minutes spent in face-to-face patient care on the date of this encounter. This time includes coordination of care and counseling regarding above mentioned problem list. Remainder of non-face-to-face time involved reviewing chart documents/testing relevant to the patient encounter and documentation in the medical record. I have independently reviewed documentation from referring provider.  Follow up: Follow up in 6 months.   Cherlynn Kaiser, MD, [facility]    Medication Adjustments/Labs and Tests Ordered: Current medicines are reviewed at length with the patient today.  Concerns regarding medicines are outlined above.   Orders Placed This Encounter  Procedures   Lipid panel   EKG 12-Lead    Patient Instructions  Medication Instructions:  START: ZETIA $RemoveBefo'10mg'jptApKKQmNb$  ONCE DAILY  *If you need a refill on your cardiac medications before your next appointment, please call your pharmacy*  Lab Work:  Please  return for FASTING Blood Work in [place]. No appointment needed, lab here at the office is open Monday-Friday from 8AM to 4PM and closed daily for lunch from 12:45-1:45.   If you have labs (blood work) drawn today and your tests are completely normal, you will receive your results only by: [place] (if you have MyChart) OR A paper copy in the mail If you have any lab test that is abnormal or we need to change your treatment, we will call you to review the results.  Follow-Up: At [facility], you and your health needs are our priority.  As part of our continuing mission to provide you with exceptional heart care, we have created designated Provider Care Teams.  These Care Teams include your primary Cardiologist  (physician) and Advanced Practice Providers (APPs -  Physician Assistants and Nurse Practitioners) who all work together to provide you with the care you need, when you need it.  Your next appointment:   6 month(s)  The format for your next appointment:   In Person  Provider:   Elouise Munroe, MD           I,Breanna Adamick,acting as a scribe for Elouise Munroe, MD.,have documented all relevant documentation on the behalf of Elouise Munroe, MD,as directed by  Elouise Munroe, MD while in the presence of Elouise Munroe, MD.   I, Elouise Munroe, MD, have reviewed all documentation for the visit on 12/30/2021. The documentation on today's date of service for the exam, diagnosis, procedures, and orders are all accurate and complete.

## 2021-12-30 NOTE — Patient Instructions (Signed)
Medication Instructions:  START: ZETIA '10mg'$  ONCE DAILY  *If you need a refill on your cardiac medications before your next appointment, please call your pharmacy*  Lab Work:  Please return for FASTING Blood Work in [place]. No appointment needed, lab here at the office is open Monday-Friday from 8AM to 4PM and closed daily for lunch from 12:45-1:45.   If you have labs (blood work) drawn today and your tests are completely normal, you will receive your results only by: [place] (if you have MyChart) OR A paper copy in the mail If you have any lab test that is abnormal or we need to change your treatment, we will call you to review the results.  Follow-Up: At [facility], you and your health needs are our priority.  As part of our continuing mission to provide you with exceptional heart care, we have created designated Provider Care Teams.  These Care Teams include your primary Cardiologist (physician) and Advanced Practice Providers (APPs -  Physician Assistants and Nurse Practitioners) who all work together to provide you with the care you need, when you need it.  Your next appointment:   6 month(s)  The format for your next appointment:   In Person  Provider:   Elouise Munroe, MD

## 2021-12-31 ENCOUNTER — Other Ambulatory Visit: Payer: Self-pay | Admitting: Family Medicine

## 2022-01-03 ENCOUNTER — Other Ambulatory Visit: Payer: Self-pay | Admitting: Family Medicine

## 2022-01-03 ENCOUNTER — Telehealth: Payer: Self-pay | Admitting: Family Medicine

## 2022-01-03 MED ORDER — DIAZEPAM 5 MG PO TABS: ORAL_TABLET | ORAL | 3 refills | Status: DC

## 2022-01-03 NOTE — Telephone Encounter (Signed)
Patient needs refill on   diazepam (VALIUM) 5 MG tablet

## 2022-01-03 NOTE — Telephone Encounter (Signed)
Pls advise.  

## 2022-01-04 ENCOUNTER — Other Ambulatory Visit: Payer: Self-pay | Admitting: Family Medicine

## 2022-01-04 DIAGNOSIS — M62830 Muscle spasm of back: Secondary | ICD-10-CM

## 2022-01-04 DIAGNOSIS — R0609 Other forms of dyspnea: Secondary | ICD-10-CM | POA: Diagnosis not present

## 2022-01-06 ENCOUNTER — Other Ambulatory Visit: Payer: Self-pay | Admitting: Family Medicine

## 2022-01-09 ENCOUNTER — Other Ambulatory Visit: Payer: Self-pay | Admitting: Family Medicine

## 2022-01-09 MED ORDER — FENOFIBRATE 48 MG PO TABS: 48.0000 mg | ORAL_TABLET | Freq: Every day | ORAL | 5 refills | Status: DC

## 2022-01-09 MED ORDER — ERGOCALCIFEROL 1.25 MG (50000 UT) PO CAPS
50000.0000 [IU] | ORAL_CAPSULE | ORAL | 2 refills | Status: DC
Start: 2022-01-09 — End: 2022-09-20

## 2022-01-14 DIAGNOSIS — M5416 Radiculopathy, lumbar region: Secondary | ICD-10-CM | POA: Diagnosis not present

## 2022-01-18 ENCOUNTER — Ambulatory Visit: Payer: Medicare Other | Admitting: Adult Health

## 2022-01-19 ENCOUNTER — Ambulatory Visit: Payer: Medicare Other | Admitting: Nurse Practitioner

## 2022-01-26 ENCOUNTER — Other Ambulatory Visit: Payer: Self-pay | Admitting: Family Medicine

## 2022-01-26 DIAGNOSIS — I10 Essential (primary) hypertension: Secondary | ICD-10-CM

## 2022-01-28 DIAGNOSIS — E1165 Type 2 diabetes mellitus with hyperglycemia: Secondary | ICD-10-CM | POA: Diagnosis not present

## 2022-01-31 ENCOUNTER — Ambulatory Visit (HOSPITAL_COMMUNITY): Admission: RE | Admit: 2022-01-31 | Payer: Medicare Other | Source: Ambulatory Visit

## 2022-01-31 ENCOUNTER — Encounter (HOSPITAL_COMMUNITY): Payer: Self-pay

## 2022-02-03 DIAGNOSIS — R0609 Other forms of dyspnea: Secondary | ICD-10-CM | POA: Diagnosis not present

## 2022-02-07 DIAGNOSIS — M5416 Radiculopathy, lumbar region: Secondary | ICD-10-CM | POA: Diagnosis not present

## 2022-02-08 DIAGNOSIS — E1159 Type 2 diabetes mellitus with other circulatory complications: Secondary | ICD-10-CM | POA: Diagnosis not present

## 2022-02-08 DIAGNOSIS — E039 Hypothyroidism, unspecified: Secondary | ICD-10-CM | POA: Diagnosis not present

## 2022-02-09 ENCOUNTER — Telehealth: Payer: Self-pay | Admitting: Pulmonary Disease

## 2022-02-09 LAB — COMPREHENSIVE METABOLIC PANEL
ALT: 24 IU/L (ref 0–32)
AST: 25 IU/L (ref 0–40)
Albumin/Globulin Ratio: 1.5 (ref 1.2–2.2)
Albumin: 4.4 g/dL (ref 3.9–4.9)
Alkaline Phosphatase: 79 IU/L (ref 44–121)
BUN/Creatinine Ratio: 20 (ref 12–28)
BUN: 17 mg/dL (ref 8–27)
Bilirubin Total: 0.2 mg/dL (ref 0.0–1.2)
CO2: 24 mmol/L (ref 20–29)
Calcium: 9.9 mg/dL (ref 8.7–10.3)
Chloride: 98 mmol/L (ref 96–106)
Creatinine, Ser: 0.87 mg/dL (ref 0.57–1.00)
Globulin, Total: 3 g/dL (ref 1.5–4.5)
Glucose: 244 mg/dL — ABNORMAL HIGH (ref 70–99)
Potassium: 4.5 mmol/L (ref 3.5–5.2)
Sodium: 136 mmol/L (ref 134–144)
Total Protein: 7.4 g/dL (ref 6.0–8.5)
eGFR: 76 mL/min/{1.73_m2} (ref >59)

## 2022-02-09 LAB — TSH: TSH: 2.87 u[IU]/mL (ref 0.450–4.500)

## 2022-02-09 LAB — T4, FREE: Free T4: 1.5 ng/dL (ref 0.82–1.77)

## 2022-02-09 MED ORDER — FUROSEMIDE 40 MG PO TABS: 40.0000 mg | ORAL_TABLET | Freq: Every day | ORAL | 0 refills | Status: DC

## 2022-02-09 NOTE — Telephone Encounter (Signed)
Lasix refilled.  Called and notified patient.

## 2022-02-15 ENCOUNTER — Ambulatory Visit: Payer: Medicare Other | Admitting: Nurse Practitioner

## 2022-02-17 ENCOUNTER — Telehealth: Payer: Self-pay | Admitting: Internal Medicine

## 2022-02-17 MED ORDER — METOPROLOL TARTRATE 25 MG PO TABS: 25.0000 mg | ORAL_TABLET | Freq: Two times a day (BID) | ORAL | 3 refills | Status: DC

## 2022-02-17 NOTE — Telephone Encounter (Signed)
*  STAT* If patient is at the pharmacy, call can be transferred to refill team.   1. Which medications need to be refilled? (please list name of each medication and dose if known) metoprolol tartrate (LOPRESSOR) 25 MG tablet  2. Which pharmacy/location (including street and city if local pharmacy) is medication to be sent to? [id number] [place], [place] - [address]. Do they need a 30 day or 90 day supply? 30 day  Patient only has 2 pills left.

## 2022-02-20 DIAGNOSIS — M25561 Pain in right knee: Secondary | ICD-10-CM | POA: Diagnosis not present

## 2022-02-20 DIAGNOSIS — M25562 Pain in left knee: Secondary | ICD-10-CM | POA: Insufficient documentation

## 2022-02-24 ENCOUNTER — Ambulatory Visit
Admission: RE | Admit: 2022-02-24 | Discharge: 2022-02-24 | Disposition: A | Discharge status: Home / Self Care | Payer: Medicare Other | Source: Ambulatory Visit | Attending: Family Medicine | Admitting: Family Medicine

## 2022-02-24 DIAGNOSIS — K760 Fatty (change of) liver, not elsewhere classified: Secondary | ICD-10-CM | POA: Diagnosis not present

## 2022-02-24 DIAGNOSIS — R1902 Left upper quadrant abdominal swelling, mass and lump: Secondary | ICD-10-CM

## 2022-02-24 DIAGNOSIS — Z9049 Acquired absence of other specified parts of digestive tract: Secondary | ICD-10-CM | POA: Diagnosis not present

## 2022-02-24 DIAGNOSIS — R1012 Left upper quadrant pain: Secondary | ICD-10-CM

## 2022-02-24 DIAGNOSIS — R1032 Left lower quadrant pain: Secondary | ICD-10-CM

## 2022-02-24 DIAGNOSIS — R109 Unspecified abdominal pain: Secondary | ICD-10-CM | POA: Diagnosis not present

## 2022-02-24 MED ORDER — IOPAMIDOL (ISOVUE-300) INJECTION 61%
125.0000 mL | Freq: Once | INTRAVENOUS | Status: AC | PRN
  Administered 2022-02-24: 125 mL via INTRAVENOUS

## 2022-02-25 ENCOUNTER — Other Ambulatory Visit: Payer: Self-pay | Admitting: "Endocrinology

## 2022-02-25 DIAGNOSIS — E1159 Type 2 diabetes mellitus with other circulatory complications: Secondary | ICD-10-CM

## 2022-02-26 ENCOUNTER — Other Ambulatory Visit: Payer: Self-pay | Admitting: Family Medicine

## 2022-02-26 DIAGNOSIS — M62830 Muscle spasm of back: Secondary | ICD-10-CM

## 2022-02-27 DIAGNOSIS — E1165 Type 2 diabetes mellitus with hyperglycemia: Secondary | ICD-10-CM | POA: Diagnosis not present

## 2022-02-28 ENCOUNTER — Ambulatory Visit (INDEPENDENT_AMBULATORY_CARE_PROVIDER_SITE_OTHER): Payer: Medicare Other | Admitting: Pulmonary Disease

## 2022-02-28 ENCOUNTER — Encounter: Payer: Self-pay | Admitting: Pulmonary Disease

## 2022-02-28 DIAGNOSIS — G4733 Obstructive sleep apnea (adult) (pediatric): Secondary | ICD-10-CM | POA: Diagnosis not present

## 2022-02-28 DIAGNOSIS — J4530 Mild persistent asthma, uncomplicated: Secondary | ICD-10-CM | POA: Diagnosis not present

## 2022-02-28 DIAGNOSIS — I5032 Chronic diastolic (congestive) heart failure: Secondary | ICD-10-CM

## 2022-02-28 MED ORDER — SYMBICORT 160-4.5 MCG/ACT IN AERO: 2.0000 | INHALATION_SPRAY | Freq: Two times a day (BID) | RESPIRATORY_TRACT | 12 refills | Status: DC

## 2022-02-28 NOTE — Assessment & Plan Note (Signed)
Lasix prescription was refilled

## 2022-02-28 NOTE — Assessment & Plan Note (Signed)
Compliant by history and on previous download. She will continue on CPAP of 10 cm  Weight loss encouraged, compliance with goal of at least 4-6 hrs every night is the expectation. Advised against medications with sedative side effects Cautioned against driving when sleepy - understanding that sleepiness will vary on a day to day basis

## 2022-02-28 NOTE — Assessment & Plan Note (Signed)
Not convinced about true asthma here We will refill her Symbicort and she can continue to use this on a as needed basis, feels her dyspnea is more related to deconditioning and obesity

## 2022-02-28 NOTE — Patient Instructions (Addendum)
  I am proud of your progress !  X refill on symbicort 160   X amb sat

## 2022-02-28 NOTE — Progress Notes (Signed)
   Subjective:    [name], female    DOB: 10/01/1960, 61 y.o.   MRN: [id number]  HPI  61 yo morbidly obese never smoker for follow-up of "asthma" and  OSA  -on CPAP 10 cm with full facemask , DME - [employer] -on O2 since 2021   Asthma was diagnosed in 2014  She reports onset of intermittent wheezing and a dry cough since her cardiac cath in 05/2018.     PMH -  difficult to control hypertension  on carvedilol, amlodipine and clonidine, HFpEF chronic opiate use  Chief Complaint  Patient presents with   Follow-up    She is having some shortness of breath with exertion and is about the same since last OV.    Since her last visit, she has increased her step down from 200 steps to 2000.  She is able to come off some of her medications.  She is able to stay off oxygen for longer durations. Compliant with CPAP at night, no problems with mask or pressure.  She does not have oxygen blended to her CPAP. She has been using Symbicort only on a as needed basis. We refilled her Lasix and she is compliant with this  She did not desaturate on ambulation today Significant tests/ events reviewed  07/2019-PFTs -ratio normal, FEV1 1.94/91%, significant bronchodilator response, improved to 105%, high normal DLCO 2015 normal spirometry   07/07/2019-right heart cath-no evidence of pulmonary hypertension, suspect noncardiac etiology for dyspnea although diastolic dysfunction component is also possible   Split night study 01/2016 >> AHI 37/h >> CPAP 10 cm   Cardiac cath 05/2018 no significant CAD, LVEDP 14   Echo 07/4626 grade 2 diastolic dysfunction, normal LVEF   CT renal stone study 06/2017 >> bibasilar atelectasis, no ILD    01/2016 split-night study-AHI 22/hour, CPAP 10 cm  Review of Systems neg for any significant sore throat, dysphagia, itching, sneezing, nasal congestion or excess/ purulent secretions, fever, chills, sweats, unintended wt loss, pleuritic or exertional  cp, hempoptysis, orthopnea pnd or change in chronic leg swelling. Also denies presyncope, palpitations, heartburn, abdominal pain, nausea, vomiting, diarrhea or change in bowel or urinary habits, dysuria,hematuria, rash, arthralgias, visual complaints, headache, numbness weakness or ataxia.     Objective:   Physical Exam   Gen. Pleasant, obese, in no distress ENT - no lesions, no post nasal drip Neck: No JVD, no thyromegaly, no carotid bruits Lungs: no use of accessory muscles, no dullness to percussion, decreased without rales or rhonchi  Cardiovascular: Rhythm regular, heart sounds  normal, no murmurs or gallops, no peripheral edema Musculoskeletal: No deformities, no cyanosis or clubbing , no tremors        Assessment & Plan:

## 2022-03-03 ENCOUNTER — Other Ambulatory Visit: Payer: Self-pay | Admitting: Family Medicine

## 2022-03-03 NOTE — Telephone Encounter (Signed)
Ok to refill 

## 2022-03-06 ENCOUNTER — Ambulatory Visit (INDEPENDENT_AMBULATORY_CARE_PROVIDER_SITE_OTHER): Payer: Medicare Other | Admitting: Nurse Practitioner

## 2022-03-06 ENCOUNTER — Encounter: Payer: Self-pay | Admitting: Nurse Practitioner

## 2022-03-06 VITALS — BP 131/81 | HR 75 | Ht 64.0 in | Wt 306.6 lb

## 2022-03-06 DIAGNOSIS — E039 Hypothyroidism, unspecified: Secondary | ICD-10-CM

## 2022-03-06 DIAGNOSIS — E782 Mixed hyperlipidemia: Secondary | ICD-10-CM | POA: Diagnosis not present

## 2022-03-06 DIAGNOSIS — E559 Vitamin D deficiency, unspecified: Secondary | ICD-10-CM | POA: Diagnosis not present

## 2022-03-06 DIAGNOSIS — I10 Essential (primary) hypertension: Secondary | ICD-10-CM | POA: Diagnosis not present

## 2022-03-06 DIAGNOSIS — R0609 Other forms of dyspnea: Secondary | ICD-10-CM | POA: Diagnosis not present

## 2022-03-06 DIAGNOSIS — E1159 Type 2 diabetes mellitus with other circulatory complications: Secondary | ICD-10-CM | POA: Diagnosis not present

## 2022-03-06 MED ORDER — INSULIN LISPRO PROT & LISPRO (75-25 MIX) 100 UNIT/ML KWIKPEN: 40.0000 [IU] | PEN_INJECTOR | Freq: Two times a day (BID) | SUBCUTANEOUS | 3 refills | Status: DC

## 2022-03-06 MED ORDER — BD PEN NEEDLE MINI U/F 31G X 5 MM MISC: 6 refills | Status: DC

## 2022-03-06 MED ORDER — LEVOTHYROXINE SODIUM 75 MCG PO TABS: 75.0000 ug | ORAL_TABLET | Freq: Every day | ORAL | 3 refills | Status: DC

## 2022-03-06 MED ORDER — GLIPIZIDE ER 5 MG PO TB24: 5.0000 mg | ORAL_TABLET | Freq: Every day | ORAL | 3 refills | Status: DC

## 2022-03-06 MED ORDER — METFORMIN HCL 500 MG PO TABS
500.0000 mg | ORAL_TABLET | Freq: Two times a day (BID) | ORAL | 3 refills | Status: DC
Start: 2022-03-06 — End: 2023-01-09

## 2022-03-06 NOTE — Progress Notes (Signed)
03/06/2022, 3:58 PM                    Endocrinology follow-up note   Subjective:    [name], female    DOB: 06-Dec-1960.  [place] is being seen in follow-up in the management of currently uncontrolled symptomatic type 2 diabetes.  She also has comorbid dyslipidemia, hypertension.   PMD:  Fayrene Helper, MD.   Past Medical History:  Diagnosis Date   Allergy    Phreesia 06/02/2020   Anxiety    Arthritis    Asthma    Asthma    Phreesia 04/12/2020   Breast mass    left (monitor)   CHF (congestive heart failure) ([place])    Phreesia 04/12/2020   Coronary atherosclerosis of native coronary artery    Mild nonobstructive CAD by cardiac catheterization 2008 - Dr. Terrence Dupont   Degenerative disc disease    Depression    Depression    Phreesia 04/12/2020   Diabetes mellitus without complication ([place])    Phreesia 04/12/2020   Dysrhythmia    Essential hypertension, benign    Folliculitis 76/16/0737   GERD (gastroesophageal reflux disease)    H/O hiatal hernia    Helicobacter pylori gastritis 2008   Hypercholesterolemia    Hypertension    Hyperthyroidism    s/p radiation   Left-sided epistaxis 05/15/2018   Low back pain    Migraine    Nephrolithiasis    Recurring episodes since 2004   Neuromuscular disorder ([place])    Phreesia 04/12/2020   Severe obesity (BMI >= 40) (HCC)    Sleep apnea    STOP BANG SCORE 6no cpap used   Thyroid disease    Phreesia 04/12/2020   Type 2 diabetes mellitus with diabetic neuropathy ([place])     Past Surgical History:  Procedure Laterality Date   [place]  2000 [facility]   INT HEMORRHOIDS/FISSURE   COLONOSCOPY  2003 [facility], CHANGE IN BOWEL HABITS   INT HEMORRHOIDS   COLONOSCOPY  2006 [facility]   INT HEMORRHOIDS    COLONOSCOPY  2007 [facility]   INT HEMORRHOIDS   CYSTOSCOPY WITH STENT PLACEMENT Left 07/25/2012   Procedure: CYSTOSCOPY, left retograde pyelogram WITH left ureteral  STENT PLACEMENT;  Surgeon: Claybon Jabs, MD;  Location: [facility];  Service: Urology;  Laterality: Left;   CYSTOSCOPY/RETROGRADE/URETEROSCOPY  12/04/2011   Procedure: CYSTOSCOPY/RETROGRADE/URETEROSCOPY;  Surgeon: Molli Hazard, MD;  Location: [facility];  Service: Urology;  Laterality: Right;   CYSTOSCOPY/RETROGRADE/URETEROSCOPY/STONE EXTRACTION WITH BASKET Left 09/03/2012   Procedure: CYSTOSCOPY/RETROGRADE pyelogram/digital URETEROSCOPY/STONE EXTRACTION WITH BASKET, left stent removal;  Surgeon: Molli Hazard, MD;  Location: [facility];  Service: Urology;  Laterality: Left;   ESOPHAGOGASTRODUODENOSCOPY  2008   TGG:YIRSW hiatal hernia./Normal esophagus without evidence of Barrett's mass, stricture, erosion or ulceration./Normal duodenal bulb and second portion of the duodenum./Diffuse erythema in the body and the antrum with occasional erosion.  Biopsies obtained via cold forceps to evaluate for H. pylori gastritis   FRACTURE SURGERY  1999   right clavicle   HOLMIUM LASER APPLICATION Left 09/01/6268   Procedure: HOLMIUM LASER APPLICATION;  Surgeon: Molli Hazard, MD;  Location: [facility];  Service: Urology;  Laterality: Left;   KNEE ARTHROSCOPY  10/04/2010,    right knee arthroscopy, dr Theda Sers   LEFT HEART CATH AND CORONARY ANGIOGRAPHY N/A 05/08/2018   Procedure: LEFT HEART CATH AND CORONARY ANGIOGRAPHY;  Surgeon: Troy Sine, MD;  Location: [place] [facility];  Service: Cardiovascular;  Laterality: N/A;   left knee arthroscopic surgery  1999   Left salphingectomy secondary to ectopic pregnancy  [place] CATH N/A 07/07/2019   Procedure: RIGHT HEART CATH;  Surgeon: Leonie Man, MD;  Location: [place] [facility];  Service: Cardiovascular;  Laterality: N/A;   rotary cuff  Right  05/14/2014   [facility]   SPINE SURGERY N/A    Phreesia 06/02/2020   UPPER GASTROINTESTINAL ENDOSCOPY  2008 abd pain   H. pylori gastritis   UPPER GASTROINTESTINAL ENDOSCOPY  1996 [place]   PUD    Social History   Socioeconomic History   Marital status: Single    Spouse name: Not on file   Number of children: 1   Years of education: 12   Highest education level: 12th grade  Occupational History   Occupation: Disable   Tobacco Use   Smoking status: Never   Smokeless tobacco: Never  Vaping Use   Vaping Use: Never used  Substance and Sexual Activity   Alcohol use: No   Drug use: No   Sexual activity: Not Currently    Partners: Male    Birth control/protection: Surgical    Comment: hyst  Other Topics Concern   Not on file  Social History Narrative   Lives alone    Social Determinants of Health   Financial Resource Strain: Low Risk  (04/18/2021)   Overall Financial Resource Strain (CARDIA)    Difficulty of Paying Living Expenses: Not very hard  Food Insecurity: No Food Insecurity (04/18/2021)   Hunger Vital Sign    Worried About Running Out of Food in the Last Year: Never true    Ran Out of Food in the Last Year: Never true  Transportation Needs: No Transportation Needs (04/18/2021)   PRAPARE - Hydrologist (Medical): No    Lack of Transportation (Non-Medical): No  Physical Activity: Sufficiently Active (04/18/2021)   Exercise Vital Sign    Days of Exercise per Week: 5 days    Minutes of Exercise per Session: 30 min  Stress: No Stress Concern Present (04/15/2020)   [place]    Feeling of Stress : Not at all  Social Connections: Socially Isolated (04/18/2021)   Social Connection and Isolation Panel [NHANES]    Frequency of Communication with Friends and Family: More than three times a week    Frequency of Social Gatherings with Friends and Family: More than three times  a week    Attends Religious Services: Never    Marine scientist or Organizations: No    Attends Archivist Meetings: Never    Marital Status: Divorced    Family History  Problem Relation Age of Onset   Heart disease Mother    Hypertension Mother    Cancer Mother        Cervical    Asthma Mother    Heart attack Father    Cancer Father        Prostate   Colon cancer Father        DECEASED AGE 12   Colon polyps Neg  Hx     Outpatient Encounter Medications as of 03/06/2022  Medication Sig   albuterol (VENTOLIN HFA) 108 (90 Base) MCG/ACT inhaler Inhale 2 puffs into the lungs every 6 (six) hours as needed for wheezing or shortness of breath.   amLODipine (NORVASC) 10 MG tablet Take 1 tablet by mouth daily.   betamethasone, augmented, (DIPROLENE) 0.05 % lotion Apply 1 application. topically 2 (two) times daily.   blood glucose meter kit and supplies Dispense based on patient and insurance preference. Once daily testing dx. e11.9   calcipotriene (DOVONOX) 0.005 % ointment Apply 1 application topically daily.    clobetasol cream (TEMOVATE) 1.96 % Apply 1 application. topically 2 (two) times daily.   cloNIDine (CATAPRES - DOSED IN MG/24 HR) 0.3 mg/24hr patch APPLY 1 PATCH TO SKIN ONCE A WEEK   cloNIDine (CATAPRES) 0.3 MG tablet TAKE 1 TABLET (0.3 MG TOTAL) BY MOUTH AT BEDTIME.   cyclobenzaprine (FLEXERIL) 10 MG tablet TAKE 1 TABLET BY MOUTH THREE TIMES A DAY   diazepam (VALIUM) 5 MG tablet Take one tablet by mouth three times dalyh for spasm   diazepam (VALIUM) 5 MG tablet Take one tablet by mouth three times daily for spasm   DULoxetine (CYMBALTA) 60 MG capsule TAKE 1 CAPSULE BY MOUTH 2 TIMES DAILY.   ergocalciferol (VITAMIN D2) 1.25 MG (50000 UT) capsule Take 1 capsule (50,000 Units total) by mouth once a week. One capsule once weekly   esomeprazole (NEXIUM) 40 MG capsule TAKE 1 CAPSULE BY MOUTH EVERY DAY   fenofibrate (TRICOR) 48 MG tablet Take 1 tablet (48 mg total) by  mouth daily.   fluconazole (DIFLUCAN) 150 MG tablet TAKE ONE TAB STAT. TAKE THE SECOND TAB ON THE THIRD DAY IF ISSUE ISN'T RESOLVED   furosemide (LASIX) 40 MG tablet Take 1 tablet (40 mg total) by mouth daily.   gabapentin (NEURONTIN) 800 MG tablet Take 1 tablet (800 mg total) by mouth 4 (four) times daily.   hydrocortisone (PROCTOSOL HC) 2.5 % rectal cream PLACE 1 APPLICATION RECTALLY 4 (FOUR) TIMES DAILY. FOR 12 DAYS   hydrOXYzine (ATARAX/VISTARIL) 10 MG tablet Take one tablet by mouth art bedtime   ipratropium-albuterol (DUONEB) 0.5-2.5 (3) MG/3ML SOLN Take 3 mLs by nebulization every 6 (six) hours as needed.   KLOR-CON M20 20 MEQ tablet TAKE 1 TABLET BY MOUTH TWICE A DAY   Lancets (ONETOUCH DELICA PLUS [id number]) MISC USE TO TEST ONCE DAILY   loratadine (CLARITIN) 10 MG tablet Take 1 tablet by mouth daily.   metoprolol tartrate (LOPRESSOR) 25 MG tablet Take 1 tablet (25 mg total) by mouth 2 (two) times daily.   naloxone (NARCAN) nasal spray 4 mg/0.1 mL One spray into each nostril once as needed   nitroGLYCERIN (NITROSTAT) 0.4 MG SL tablet Place 1 tablet (0.4 mg total) under the tongue every 5 (five) minutes as needed for chest pain.   ONETOUCH VERIO test strip USE AS INSTRUCTED FOUR TIMES DAILY TESTING DX E11.65   oxyCODONE (OXYCONTIN) 40 mg 12 hr tablet Take one tablet by mouth two times daily for pain   oxyCODONE (OXYCONTIN) 40 mg 12 hr tablet Take one tablet by mouth two times daily   oxyCODONE (OXYCONTIN) 40 mg 12 hr tablet Take 1 tablet (40 mg total) by mouth every 12 (twelve) hours.   oxyCODONE (OXYCONTIN) 40 mg 12 hr tablet Take 1 tablet (40 mg total) by mouth every 12 (twelve) hours.   [START ON 03/08/2022] oxyCODONE (OXYCONTIN) 40 mg 12 hr tablet Take 1 tablet (  40 mg total) by mouth every 12 (twelve) hours.   spironolactone (ALDACTONE) 25 MG tablet TAKE 1 TABLET (25 MG TOTAL) BY MOUTH DAILY.   SYMBICORT 160-4.5 MCG/ACT inhaler Inhale 2 puffs into the lungs in the morning and at  bedtime.   tiZANidine (ZANAFLEX) 2 MG tablet    triamterene-hydrochlorothiazide (MAXZIDE) 75-50 MG tablet    UNABLE TO FIND BD Ultra-Fine Mini Pen Needle 31 gauge x 3/16"  USE AS DIRECTED TWICE A DAY   UNABLE TO FIND OneTouch Verio test strips  USE AS INSTRUCTED FOUR TIMES DAILY TESTING DX E11.65   UNABLE TO FIND OneTouch Delica Plus Lancet 33 gauge  USE TO TEST ONCE DAILY   zolpidem (AMBIEN) 10 MG tablet Take 1 tablet (10 mg total) by mouth at bedtime.   [DISCONTINUED] B-D UF III MINI PEN NEEDLES 31G X 5 MM MISC 2 (two) times daily. as directed   [DISCONTINUED] glipiZIDE (GLUCOTROL XL) 5 MG 24 hr tablet Take 1 tablet (5 mg total) by mouth daily with breakfast.   [DISCONTINUED] Insulin Lispro Prot & Lispro (HUMALOG 75/25 MIX) (75-25) 100 UNIT/ML Kwikpen INJECT 65 UNITS INTO THE SKIN 2 (TWO) TIMES DAILY WITH A MEAL   [DISCONTINUED] levothyroxine (SYNTHROID) 75 MCG tablet TAKE 1 TABLET BY MOUTH EVERY DAY   [DISCONTINUED] metFORMIN (GLUCOPHAGE) 500 MG tablet TAKE 1 TABLET BY MOUTH TWICE A DAY WITH MEALS   atorvastatin (LIPITOR) 80 MG tablet    B-D UF III MINI PEN NEEDLES 31G X 5 MM MISC Use to inject insulin twice daily   glipiZIDE (GLUCOTROL XL) 5 MG 24 hr tablet Take 1 tablet (5 mg total) by mouth daily with breakfast.   Insulin Lispro Prot & Lispro (HUMALOG 75/25 MIX) (75-25) 100 UNIT/ML Kwikpen Inject 40 Units into the skin in the morning and at bedtime.   levothyroxine (SYNTHROID) 75 MCG tablet Take 1 tablet (75 mcg total) by mouth daily.   metFORMIN (GLUCOPHAGE) 500 MG tablet Take 1 tablet (500 mg total) by mouth 2 (two) times daily with a meal.   [DISCONTINUED] ezetimibe (ZETIA) 10 MG tablet Take 1 tablet (10 mg total) by mouth daily.   No facility-administered encounter medications on file as of 03/06/2022.    ALLERGIES: Allergies  Allergen Reactions   Latex Itching and Rash   Morphine Other (See Comments)    Reaction with esophagus, unable to swallow.    Morphine And Related  Shortness Of Breath and Swelling   Other Itching, Shortness Of Breath and Swelling   Ace Inhibitors Cough   Aspirin Other (See Comments)    Reaction with esophagus, unable to swallow.    Losartan Cough   Tapentadol Other (See Comments)    Nausea, increased sleepiness, h/a   Adhesive [Tape] Rash   Tomato Other (See Comments)    Acid reflux due to acid in tomato    VACCINATION STATUS: Immunization History  Administered Date(s) Administered   Influenza Split 03/30/2011   Influenza Whole 03/21/2005, 02/09/2006   Influenza,inj,Quad PF,6+ Mos 02/11/2014, 07/27/2015, 03/09/2016, 01/29/2017, 02/07/2018, 01/30/2019, 02/11/2020, 03/03/2021, 12/29/2021   Influenza-Unspecified 01/29/2017   PFIZER(Purple Top)SARS-COV-2 Vaccination 07/22/2019, 08/12/2019, 03/05/2020, 11/19/2020   Pneumococcal Polysaccharide-23 04/17/2006, 09/14/2015   Td 04/17/2006    Diabetes She presents for her follow-up diabetic visit. She has type 2 diabetes mellitus. Her disease course has been worsening. There are no hypoglycemic associated symptoms. Pertinent negatives for hypoglycemia include no confusion, headaches, pallor or seizures. Associated symptoms include fatigue. Pertinent negatives for diabetes include no blurred vision, no chest pain,  no polydipsia, no polyphagia, no polyuria and no weight loss. There are no hypoglycemic complications. Symptoms are stable. Diabetic complications include heart disease, nephropathy and peripheral neuropathy. Risk factors for coronary artery disease include dyslipidemia, diabetes mellitus, family history, hypertension, obesity, sedentary lifestyle, tobacco exposure and post-menopausal. Current diabetic treatment includes insulin injections and oral agent (dual therapy). She is compliant with treatment most of the time. Her weight is fluctuating minimally. She is following a generally healthy diet. When asked about meal planning, she reported none. She has had a previous visit with a  dietitian. She never participates in exercise. Her home blood glucose trend is increasing steadily. Her overall blood glucose range is >200 mg/dl. (She presents today with her meter and logs showing gross hyperglycemic overall.  Her previsit A1c on 8/31 was 10.2%, increasing from previous visit of 9.5%.  She has had steroid injection in right knee, is scheduled to have another one soon.  Analysis of her meter shows 7-day average of 219, 14-day average of 241, 30-day average of 255.  She denies any hypoglycemia.  She could not tolerate the Dexcom G7, had allergic reaction to adhesive.) An ACE inhibitor/angiotensin II receptor blocker is contraindicated. She does not see a podiatrist.Eye exam is not current.  Hyperlipidemia This is a chronic problem. The current episode started more than 1 year ago. The problem is controlled. Recent lipid tests were reviewed and are normal. Exacerbating diseases include chronic renal disease, diabetes, hypothyroidism and obesity. Factors aggravating her hyperlipidemia include beta blockers and thiazides. Pertinent negatives include no chest pain, myalgias or shortness of breath. Current antihyperlipidemic treatment includes statins. The current treatment provides mild improvement of lipids. Compliance problems include adherence to diet and adherence to exercise.  Risk factors for coronary artery disease include diabetes mellitus, dyslipidemia, hypertension, obesity and a sedentary lifestyle.  Hypertension This is a chronic problem. The current episode started more than 1 year ago. The problem has been gradually improving since onset. The problem is controlled. Associated symptoms include peripheral edema. Pertinent negatives include no blurred vision, chest pain, headaches, palpitations or shortness of breath. Agents associated with hypertension include thyroid hormones. Risk factors for coronary artery disease include family history, dyslipidemia, diabetes mellitus, obesity,  sedentary lifestyle and post-menopausal state. Past treatments include calcium channel blockers, diuretics and beta blockers. The current treatment provides moderate improvement. Compliance problems include diet and exercise.  Hypertensive end-organ damage includes kidney disease, CAD/MI and heart failure. Identifiable causes of hypertension include chronic renal disease and sleep apnea.    Review of systems  Constitutional: + minimally fluctuating body weight,  current Body mass index is 52.63 kg/m. , + fatigue, no subjective hyperthermia, no subjective hypothermia Eyes: no blurry vision, no xerophthalmia ENT: no sore throat, no nodules palpated in throat, no dysphagia/odynophagia, no hoarseness Cardiovascular: no chest pain, no shortness of breath, no palpitations, no leg swelling Respiratory: no cough, no SOB Gastrointestinal: no nausea/vomiting/diarrhea Musculoskeletal: no muscle/joint aches, walks with cane d/t hx of falls Skin: no rashes, no hyperemia Neurological: no tremors, no numbness, no tingling, no dizziness Psychiatric: no depression, no anxiety,    Objective:    BP 131/81 (BP Location: Right Arm, Patient Position: Sitting, Cuff Size: Large)   Pulse 75   Ht _0  (1.626 m)   Wt (!) 306 lb 9.6 oz (139.1 kg)   BMI 52.63 kg/m   Wt Readings from Last 3 Encounters:  03/06/22 (!) 306 lb 9.6 oz (139.1 kg)  02/28/22 (!) 307 lb 9.6 oz (139.5 kg)  12/30/21 (!) 312 lb 3.2 oz (141.6 kg)    BP Readings from Last 3 Encounters:  03/06/22 131/81  02/28/22 110/72  12/30/21 132/74     Physical Exam- Limited  Constitutional:  Body mass index is 52.63 kg/m. , not in acute distress, normal state of mind Eyes:  EOMI, no exophthalmos Neck: Supple Cardiovascular: RRR, no murmurs, rubs, or gallops, no edema Respiratory: Adequate breathing efforts, no crackles, rales, rhonchi, or wheezing Musculoskeletal: no gross deformities, strength intact in all four extremities, no gross  restriction of joint movements, walks with cane due to disequilibrium and hx of falls Skin:  no rashes, no hyperemia Neurological: no tremor with outstretched hands   CMP ( most recent) CMP     Component Value Date/Time   NA 136 02/08/2022 0821   K 4.5 02/08/2022 0821   CL 98 02/08/2022 0821   CO2 24 02/08/2022 0821   GLUCOSE 244 (H) 02/08/2022 0821   GLUCOSE 316 (H) 02/11/2020 1111   BUN 17 02/08/2022 0821   CREATININE 0.87 02/08/2022 0821   CREATININE 0.91 02/11/2020 1111   CALCIUM 9.9 02/08/2022 0821   PROT 7.4 02/08/2022 0821   ALBUMIN 4.4 02/08/2022 0821   AST 25 02/08/2022 0821   ALT 24 02/08/2022 0821   ALKPHOS 79 02/08/2022 0821   BILITOT 0.2 02/08/2022 0821   GFRNONAA 70 05/06/2020 1201   GFRNONAA 69 02/11/2020 1111   GFRAA 80 05/06/2020 1201   GFRAA 80 02/11/2020 1111    Diabetic Labs (most recent): Lab Results  Component Value Date   HGBA1C 10.2 (H) 12/29/2021   HGBA1C 9.5 (A) 09/23/2021   HGBA1C 9.2 (A) 06/23/2021   MICROALBUR 101.9 (H) 11/10/2019   MICROALBUR 2.9 10/08/2017   MICROALBUR 33.7 (H) 07/06/2017     Lipid Panel ( most recent) Lipid Panel     Component Value Date/Time   CHOL 195 12/29/2021 1130   TRIG 182 (H) 12/29/2021 1130   HDL 39 (L) 12/29/2021 1130   CHOLHDL 5.0 (H) 12/29/2021 1130   CHOLHDL 4.0 11/10/2019 1303   VLDL 16 05/06/2018 0335   LDLCALC 124 (H) 12/29/2021 1130   LDLCALC 97 11/10/2019 1303      Assessment & Plan:   1) Uncontrolled type 2 diabetes mellitus with hyperglycemia (HCC)  - Angela Macaluso has currently uncontrolled symptomatic type 2 DM since  61 years of age.  She presents today with her meter and logs showing gross hyperglycemic overall.  Her previsit A1c on 8/31 was 10.2%, increasing from previous visit of 9.5%.  She has had steroid injection in right knee, is scheduled to have another one soon.  Analysis of her meter shows 7-day average of 219, 14-day average of 241, 30-day average of 255.  She denies any  hypoglycemia.  She could not tolerate the Dexcom G7, had allergic reaction to adhesive.   Recent labs reviewed.  -her diabetes is complicated by coronary artery disease, CHF, obesity/sedentary life and she remains at a high risk for more acute and chronic complications which include CAD, CVA, CKD, retinopathy, and neuropathy. These are all discussed in detail with her.  The following Lifestyle Medicine recommendations according to [facility]) were discussed and offered to patient and she agrees to start the journey:  A. Whole Foods, Plant-based plate comprising of fruits and vegetables, plant-based proteins, whole-grain carbohydrates was discussed in detail with the patient.   A list for source of those nutrients were also provided to the patient.  Patient will use  only water or unsweetened tea for hydration. B.  The need to stay away from risky substances including alcohol, smoking; obtaining 7 to 9 hours of restorative sleep, at least 150 minutes of moderate intensity exercise weekly, the importance of healthy social connections,  and stress reduction techniques were discussed. C.  A full color page of  Calorie density of various food groups per pound showing examples of each food groups was provided to the patient.  - Nutritional counseling repeated at each appointment due to patients tendency to fall back in to old habits.  - The patient admits there is a room for improvement in their diet and drink choices. -  Suggestion is made for the patient to avoid simple carbohydrates from their diet including Cakes, Sweet Desserts / Pastries, Ice Cream, Soda (diet and regular), Sweet Tea, Candies, Chips, Cookies, Sweet Pastries, Store Bought Juices, Alcohol in Excess of 1-2 drinks a day, Artificial Sweeteners, Coffee Creamer, and "Sugar-free" Products. This will help patient to have stable blood glucose profile and potentially avoid unintended weight gain.   - I  encouraged the patient to switch to unprocessed or minimally processed complex starch and increased protein intake (animal or plant source), fruits, and vegetables.   - Patient is advised to stick to a routine mealtimes to eat 3 meals a day and avoid unnecessary snacks (to snack only to correct hypoglycemia).  - I have approached her with the following individualized plan to manage diabetes and patient agrees:   -Given her presentation with chronic glycemic burden, she will likely require multiple daily injections of insulin in order for her to achieve control of diabetes to target.  -She is advised to increase her Humalog 75/25 to 40 units SQ twice daily with breakfast and supper.  She can continue her Metformin 500 mg po twice daily and lower her Glipizide to 5 mg XL daily with breakfast.  We discussed eating more fruits and veggies and eating less processed foods.  -She is encouraged to consistently monitor glucose 3 times daily, before injecting insulin and before bed, and to call the clinic if she has readings less than 70 or greater than 300 for 3 tests in a row.  She could not tolerate the Dexcom G7 (had allergic reaction to adhesive).  - she is warned not to take insulin without proper monitoring per orders.  2) Blood Pressure /Hypertension: Her blood pressure is controlled to target.  She is advised to continue Norvasc 10 mg po daily, Clonidine 0.3 mg patch once weekly, Clonidine 0.3 mg po daily at bedtime, Lasix 40 mg po daily, Spironolactone 25 mg po daily, and Metoprolol 25 mg po twice daily.  3) Lipids/Hyperlipidemia:  Her most recent lipid panel from 06/09/21 shows controlled LDL at 75.  She is advised to continue Crestor 5 mg po every other day at bedtime.    4)  Weight/Diet:  Her Body mass index is 52.63 kg/m. - clearly complicating her diabetes care.  She is a candidate for modest weight loss.  She has multiple comorbid situations complicating her chance of bariatric surgery.  I  discussed with her the fact that loss of 5 - 10% of her  current body weight will have the most impact on her diabetes management.  CDE Consult will be initiated . Exercise, and detailed carbohydrates information provided  -  detailed on discharge instructions.  5) Hypothyroidism:   -Her previsit TFTs are consistent with appropriate hormone replacement.   She is advised to continue Levothyroxine 75  mcg po daily before breakfast.    - We discussed about the correct intake of her thyroid hormone, on empty stomach at fasting, with water, separated by at least 30 minutes from breakfast and other medications,  and separated by more than 4 hours from calcium, iron, multivitamins, acid reflux medications (PPIs). -Patient is made aware of the fact that thyroid hormone replacement is needed for life, dose to be adjusted by periodic monitoring of thyroid function tests.  6) Chronic Care/Health Maintenance: -she is not on ACE/ARB (intolerant to both) is on statin medications and is encouraged to initiate and continue to follow up with Ophthalmology, Dentist,  Podiatrist at least yearly or according to recommendations, and advised to  stay away from smoking. I have recommended yearly flu vaccine and pneumonia vaccine at least every 5 years; moderate intensity exercise for up to 150 minutes weekly; and  sleep for at least 7 hours a day.  - she is advised to maintain close follow up with Fayrene Helper, MD for primary care needs, as well as her other providers for optimal and coordinated care.      I spent 44 minutes in the care of the patient today including review of labs from [place], Lipids, Thyroid Function, Hematology (current and previous including abstractions from other facilities); face-to-face time discussing  her blood glucose readings/logs, discussing hypoglycemia and hyperglycemia episodes and symptoms, medications doses, her options of short and long term treatment based on the latest standards of  care / guidelines;  discussion about incorporating lifestyle medicine;  and documenting the encounter. Risk reduction counseling performed per USPSTF guidelines to reduce obesity and cardiovascular risk factors.     Please refer to Patient Instructions for Blood Glucose Monitoring and Insulin/Medications Dosing Guide"  in media tab for additional information. Please  also refer to " Patient Self Inventory" in the Media  tab for reviewed elements of pertinent patient history.  [place] participated in the discussions, expressed understanding, and voiced agreement with the above plans.  All questions were answered to her satisfaction. she is encouraged to contact clinic should she have any questions or concerns prior to her return visit.   Follow up plan: - Return in about 3 months (around 06/06/2022) for Diabetes F/U with A1c in office, No previsit labs, Bring meter and logs.    Rayetta Pigg, [facility] [address] [place], [place] [address] Phone: [phone] Fax: [phone]   03/06/2022, 3:58 PM

## 2022-03-06 NOTE — Patient Instructions (Signed)

## 2022-03-07 ENCOUNTER — Encounter: Payer: Self-pay | Admitting: Family Medicine

## 2022-03-07 ENCOUNTER — Ambulatory Visit (INDEPENDENT_AMBULATORY_CARE_PROVIDER_SITE_OTHER): Payer: Medicare Other | Admitting: Family Medicine

## 2022-03-07 VITALS — BP 136/79 | HR 68 | Ht 64.0 in | Wt 307.0 lb

## 2022-03-07 DIAGNOSIS — Z0001 Encounter for general adult medical examination with abnormal findings: Secondary | ICD-10-CM | POA: Diagnosis not present

## 2022-03-07 DIAGNOSIS — R52 Pain, unspecified: Secondary | ICD-10-CM

## 2022-03-07 DIAGNOSIS — E1165 Type 2 diabetes mellitus with hyperglycemia: Secondary | ICD-10-CM | POA: Diagnosis not present

## 2022-03-07 NOTE — Patient Instructions (Signed)
F/u in 15 weeks, call if you need me sooner  Microalb today  Medications will be sent as discussed  Pleas get eye exam, this is overdue  Important you protect and examine your feet daily  Work on increasing vegetables and reducing sweets and starchy foods  Thanks for choosing [facility], we consider it a privelige to serve you.

## 2022-03-09 LAB — MICROALBUMIN / CREATININE URINE RATIO
Creatinine, Urine: 61.7 mg/dL
Microalb/Creat Ratio: 16 mg/g creat (ref 0–29)
Microalbumin, Urine: 9.8 ug/mL

## 2022-03-11 ENCOUNTER — Encounter: Payer: Self-pay | Admitting: Family Medicine

## 2022-03-11 ENCOUNTER — Other Ambulatory Visit: Payer: Self-pay | Admitting: Family Medicine

## 2022-03-11 MED ORDER — OXYCODONE HCL ER 40 MG PO T12A: 40.0000 mg | EXTENDED_RELEASE_TABLET | Freq: Two times a day (BID) | ORAL | 0 refills | Status: DC

## 2022-03-11 MED ORDER — OXYCODONE HCL ER 40 MG PO T12A: EXTENDED_RELEASE_TABLET | ORAL | 0 refills | Status: AC

## 2022-03-11 NOTE — Progress Notes (Signed)
    Angela [name]     MRN: [id number]      DOB: [birth year]  HPI: Patient is in for annual physical exam. Chronic pain management is addressed Recent labs,  are reviewed. Immunization is reviewed , and  updated if needed.   PE: BP 136/79 (BP Location: Right Arm, Patient Position: Sitting, Cuff Size: Large)   Pulse 68   Ht '5\' 4"'$  (1.626 m)   Wt (!) 307 lb (139.3 kg)   SpO2 98%   BMI 52.70 kg/m   Pleasant  female, alert and oriented x 3, in no cardio-pulmonary distress. Afebrile. HEENT No facial trauma or asymetry. Sinuses non tender.  Extra occullar muscles intact.. External ears normal, . Neck: supple, no adenopathy,JVD or thyromegaly.No bruits.  Chest: Clear to ascultation bilaterally.No crackles or wheezes. Non tender to palpation   Cardiovascular system; Heart sounds normal,  S1 and  S2 ,no S3.  No murmur, or thrill. Apical beat not displaced Peripheral pulses normal.  Abdomen: Soft, non tender,   Musculoskeletal exam: Decreased  ROM of spine, hips , shoulders and knees. deformity ,swelling or crepitus noted. No muscle wasting or atrophy.   Neurologic: Cranial nerves 2 to 12 intact. Power, tone ,sensation normal throughout.  disturbance in gait. No tremor.Ambulates with a cane  Skin: Intact, no ulceration, erythema , scaling or rash noted. Pigmentation normal throughout  Psych; Normal mood and affect. Judgement and concentration normal   Assessment & Plan:  Annual visit for general adult medical examination with abnormal findings Annual exam as documented. Counseling done  re healthy lifestyle involving commitment to 150 minutes exercise per week, heart healthy diet, and attaining healthy weight.The importance of adequate sleep also discussed. Regular seat belt use and home safety, is also discussed. Changes in health habits are decided on by the patient with goals and time frames  set for achieving them. Immunization and cancer screening needs are  specifically addressed at this visit.   Encounter for pain management The patient's Controlled Substance registry is reviewed and compliance confirmed. Adequacy of  Pain control and level of function is assessed. Medication dosing is adjusted as deemed appropriate. Twelve weeks of medication is prescribed , with a follow up appointment between 11 to 12 weeks .

## 2022-03-11 NOTE — Assessment & Plan Note (Signed)

## 2022-03-11 NOTE — Assessment & Plan Note (Signed)
The patient's Controlled Substance registry is reviewed and compliance confirmed. Adequacy of  Pain control and level of function is assessed. Medication dosing is adjusted as deemed appropriate. Twelve weeks of medication is prescribed , with a follow up appointment between 11 to 12 weeks .  

## 2022-03-13 ENCOUNTER — Encounter: Payer: Self-pay | Admitting: Internal Medicine

## 2022-03-13 ENCOUNTER — Ambulatory Visit (INDEPENDENT_AMBULATORY_CARE_PROVIDER_SITE_OTHER): Payer: Medicare Other | Admitting: Internal Medicine

## 2022-03-13 ENCOUNTER — Ambulatory Visit: Payer: Medicare Other | Admitting: Podiatry

## 2022-03-13 ENCOUNTER — Encounter: Payer: Self-pay | Admitting: Family Medicine

## 2022-03-13 DIAGNOSIS — H1033 Unspecified acute conjunctivitis, bilateral: Secondary | ICD-10-CM | POA: Diagnosis not present

## 2022-03-13 DIAGNOSIS — H109 Unspecified conjunctivitis: Secondary | ICD-10-CM | POA: Insufficient documentation

## 2022-03-13 MED ORDER — POLYMYXIN B-TRIMETHOPRIM 10000-0.1 UNIT/ML-% OP SOLN: 1.0000 [drp] | OPHTHALMIC | 0 refills | Status: AC

## 2022-03-13 NOTE — Progress Notes (Signed)
     This is a telephone encounter between Angela [name] and Angela [name] on 03/13/2022 for conjunctivitis. The visit was conducted with the patient located at home and Angela [name] at Angela [name]. The patient's identity was confirmed using their DOB and current address. The patient has consented to being evaluated through a telephone encounter and understands the associated risks (an examination cannot be done and the patient may need to come in for an appointment) / benefits (allows the patient to remain at home, decreasing exposure to coronavirus).    CC: Conjunctivitis (Right eye was red Thursday am burning when light hits it still now its her left eye that is puffy and burns. No drainage but does itch around eye )   HPI:[name] is a 61 y.o. female who presents for evaluation of conjunctivitis. For the details of today's visit, please refer to the assessment and plan.   Past Medical History:  Diagnosis Date   Allergy    Phreesia 06/02/2020   Anxiety    Arthritis    Asthma    Asthma    Phreesia 04/12/2020   Breast mass    left (monitor)   CHF (congestive heart failure) ([place])    Phreesia 04/12/2020   Coronary atherosclerosis of native coronary artery    Mild nonobstructive CAD by cardiac catheterization 2008 - Dr. Terrence Dupont   Degenerative disc disease    Depression    Depression    Phreesia 04/12/2020   Diabetes mellitus without complication ([place])    Phreesia 04/12/2020   Dysrhythmia    Essential hypertension, benign    Folliculitis 33/54/5625   GERD (gastroesophageal reflux disease)    H/O hiatal hernia    Helicobacter pylori gastritis 2008   Hypercholesterolemia    Hypertension    Hyperthyroidism    s/p radiation   Left-sided epistaxis 05/15/2018   Low back pain    Migraine    Nephrolithiasis    Recurring episodes since 2004   Neuromuscular disorder ([place])    Phreesia 04/12/2020   Severe obesity (BMI >= 40) ([facility])    Sleep apnea    STOP BANG SCORE 6no cpap  used   Thyroid disease    Phreesia 04/12/2020   Type 2 diabetes mellitus with diabetic neuropathy [facility])       Assessment & Plan:   Bilateral conjunctivitis Patient noticed her right eye was red on Thursday. Her left eye is now puffy and red. She wears glasses but not contacts. No changes in vision. She is having some light sensitivity. History of seasonal allergies, but no other allergic symptoms. No viral symptoms, or urethritis. She has tried some Vaseline cream, but it has not improved eye redness. Patient is aware bacterial conjunctivitis is contagious and avoiding others.   Assessment/Plan: Bilateral conjunctivitis, will treat as possible bacterial etiology. Patient sent picture, but limited evaluation. Discussed if not improving in a few days to make appointment for in person evaluation.  - trimethoprim-polymyxin b (POLYTRIM) ophthalmic solution; Place 1 drop into both eyes every 4 (four) hours for 7 days. Max 4 doses per day.  Dispense: 10 mL; Refill: 0   Time:   Today, I have spent 15 minutes reviewing the chart, including problem list, medications, and with the patient with telehealth technology discussing the above problems.  Angela Dy, MD

## 2022-03-13 NOTE — Assessment & Plan Note (Signed)
Patient noticed her right eye was red on Thursday. Her left eye is now puffy and red. She wears glasses but not contacts. No changes in vision. She is having some light sensitivity. History of seasonal allergies, but no other allergic symptoms. No viral symptoms, or urethritis. She has tried some Vaseline cream, but it has not improved eye redness. Patient is aware bacterial conjunctivitis is contagious and avoiding others.   Assessment/Plan: Bilateral conjunctivitis, will treat as possible bacterial etiology. Patient sent picture, but limited evaluation. Discussed if not improving in a few days to make appointment for in person evaluation.  - trimethoprim-polymyxin b (POLYTRIM) ophthalmic solution; Place 1 drop into both eyes every 4 (four) hours for 7 days. Max 4 doses per day.  Dispense: 10 mL; Refill: 0

## 2022-03-15 DIAGNOSIS — M5416 Radiculopathy, lumbar region: Secondary | ICD-10-CM | POA: Diagnosis not present

## 2022-03-18 ENCOUNTER — Other Ambulatory Visit: Payer: Self-pay | Admitting: Family Medicine

## 2022-03-20 ENCOUNTER — Other Ambulatory Visit: Payer: Self-pay | Admitting: Family Medicine

## 2022-03-20 ENCOUNTER — Telehealth: Payer: Self-pay | Admitting: Family Medicine

## 2022-03-20 MED ORDER — ZOLPIDEM TARTRATE 10 MG PO TABS: 10.0000 mg | ORAL_TABLET | Freq: Every day | ORAL | 5 refills | Status: DC

## 2022-03-20 NOTE — Telephone Encounter (Signed)
Pt called stating this was sent in at her ov. But she is out & phar is stating no refills.      Prescription Request  03/20/2022  Is this a "Controlled Substance" medicine? Yes  LOV: 03/18/2022   What is the name of the medication or equipment? zolpidem (AMBIEN) 10 MG tablet   Have you contacted your pharmacy to request a refill? Yes   Which pharmacy would you like this sent to?  [id number] [place]Phone: [phone]: [address] [place]Phone: [phone]: [phone]  Patient notified that their request is being sent to the clinical staff for review and that they should receive a response within 2 business days.   Please advise at [phone](mobile)

## 2022-03-20 NOTE — Telephone Encounter (Signed)
Please send electronically

## 2022-03-21 ENCOUNTER — Encounter: Payer: Self-pay | Admitting: Podiatry

## 2022-03-21 ENCOUNTER — Ambulatory Visit (INDEPENDENT_AMBULATORY_CARE_PROVIDER_SITE_OTHER): Payer: Medicare Other | Admitting: Podiatry

## 2022-03-21 DIAGNOSIS — Q828 Other specified congenital malformations of skin: Secondary | ICD-10-CM

## 2022-03-21 DIAGNOSIS — E1142 Type 2 diabetes mellitus with diabetic polyneuropathy: Secondary | ICD-10-CM

## 2022-03-21 DIAGNOSIS — M79674 Pain in right toe(s): Secondary | ICD-10-CM

## 2022-03-21 DIAGNOSIS — B351 Tinea unguium: Secondary | ICD-10-CM | POA: Diagnosis not present

## 2022-03-21 DIAGNOSIS — L84 Corns and callosities: Secondary | ICD-10-CM

## 2022-03-21 DIAGNOSIS — M79675 Pain in left toe(s): Secondary | ICD-10-CM | POA: Diagnosis not present

## 2022-03-25 NOTE — Progress Notes (Signed)
  Subjective:  [name], female    DOB: 03-May-1960,  MRN: [id number]  Angela [name] presents to clinic today for at risk foot care with history of diabetic neuropathy and painful porokeratotic lesions right lower extremity, callus LLE and mycotic toenails b/l. Pain prevent(s) comfortable ambulation. Aggravating factor is weightbearing with and without shoegear.  Chief Complaint  Patient presents with   foot care    Patient is here for diabetic foot care, blood sugar 176 this morning, last A1c was 10 in August, primary is Dr. Moshe [name] last seen 03/07/22.   New problem(s): None.   PCP is Angela Helper, MD.  Allergies  Allergen Reactions   Latex Itching and Rash   Morphine Other (See Comments)    Reaction with esophagus, unable to swallow.    Morphine And Related Shortness Of Breath and Swelling   Other Itching, Shortness Of Breath and Swelling   Ace Inhibitors Cough   Aspirin Other (See Comments)    Reaction with esophagus, unable to swallow.    Losartan Cough   Tapentadol Other (See Comments)    Nausea, increased sleepiness, h/a   Adhesive [Tape] Rash   Tomato Other (See Comments)    Acid reflux due to acid in tomato    Review of Systems: Negative except as noted in the HPI.  Objective: No changes noted in today's physical examination.  [name] is a pleasant 61 y.o. female morbidly obese in NAD. AAO x 3.  Neurovascular Examination: CFT immediate b/l LE. Palpable DP/PT pulses b/l LE. Digital hair sparse b/l. Skin temperature gradient WNL b/l. No pain with calf compression b/l. No edema noted b/l. No cyanosis or clubbing noted b/l LE.  Pt has subjective symptoms of neuropathy. Protective sensation decreased with 10 gram monofilament b/l.  Dermatological:  Pedal integument with normal turgor, texture and tone b/l LE. No open wounds b/l. No interdigital macerations b/l. Toenails 1-5 b/l elongated, thickened, discolored with subungual debris.  +Tenderness with dorsal palpation of nailplates.   Porokeratotic lesion(s) noted submet head 5 left foot and plantar aspect of right heel and plantarlateral midfoot RLE.  Musculoskeletal:  Muscle strength 5/5 to all lower extremity muscle groups bilaterally. HAV with bunion deformity noted b/l LE. Utilizes cane for ambulation assistance.  Assessment/Plan: 1. Pain due to onychomycosis of toenails of both feet   2. Callus   3. Porokeratosis   4. Diabetic peripheral neuropathy associated with type 2 diabetes mellitus ([place])     No orders of the defined types were placed in this encounter.   -Patient was evaluated and treated. All patient's and/or POA's questions/concerns answered on today's visit. -Medicaid ABN signed for this year. Patient consents for services of paring of lesions today. Copy has been placed in patient chart. -Continue foot and shoe inspections daily. Monitor blood glucose per PCP/Endocrinologist's recommendations. -Mycotic toenails 1-5 bilaterally were debrided in length and girth with sterile nail nippers and dremel without incident. -Porokeratosis submet head 5 left foot pared utilizing rotary bur without complication or incident. Total number pared =1. -Porokeratotic lesion(s) right heel and plantarlateral aspect of midfoot right foot pared and enucleated with sterile currette without incident. Total number of lesions debrided=2. -Patient/POA to call should there be question/concern in the interim.   Return in about 3 months (around 06/21/2022).  Angela [name], DPM

## 2022-03-27 ENCOUNTER — Other Ambulatory Visit: Payer: Self-pay | Admitting: Family Medicine

## 2022-03-27 NOTE — Telephone Encounter (Signed)
Pt called & wants to know if you can please refill? She has been out for 3 day.

## 2022-04-04 ENCOUNTER — Other Ambulatory Visit: Payer: Self-pay | Admitting: Family Medicine

## 2022-04-05 DIAGNOSIS — R0609 Other forms of dyspnea: Secondary | ICD-10-CM | POA: Diagnosis not present

## 2022-04-10 DIAGNOSIS — M5416 Radiculopathy, lumbar region: Secondary | ICD-10-CM | POA: Diagnosis not present

## 2022-04-13 ENCOUNTER — Other Ambulatory Visit: Payer: Self-pay | Admitting: Family Medicine

## 2022-04-14 ENCOUNTER — Telehealth: Payer: Self-pay | Admitting: Pulmonary Disease

## 2022-04-18 NOTE — Telephone Encounter (Signed)
Dr Elsworth Soho sir,  Can you please advise if you are still wanting to fill the Lasix '40mg'$  for this patient. I have to know this information before I call pharmacy back  Please and thank you

## 2022-04-18 NOTE — Telephone Encounter (Signed)
Called pharmacy to make sure that they were refilling this. Any more refills need to go to cardiologist pre Elsworth Soho. Nothing further needed

## 2022-04-19 ENCOUNTER — Ambulatory Visit (INDEPENDENT_AMBULATORY_CARE_PROVIDER_SITE_OTHER): Payer: Medicare Other | Admitting: Internal Medicine

## 2022-04-19 ENCOUNTER — Encounter: Payer: Self-pay | Admitting: Internal Medicine

## 2022-04-19 DIAGNOSIS — Z0000 Encounter for general adult medical examination without abnormal findings: Secondary | ICD-10-CM | POA: Diagnosis not present

## 2022-04-19 NOTE — Patient Instructions (Addendum)
  Angela [name] , Thank you for taking time to come for your Medicare Wellness Visit. I appreciate your ongoing commitment to your health goals. Please review the following plan we discussed and let me know if I can assist you in the future.   These are the goals we discussed: Patient is going to continue working with her PCP to manage chronic health conditions and focus on losing weight.    This is a list of the screening recommended for you and due dates:  Health Maintenance  Topic Date Due   Zoster (Shingles) Vaccine (1 of 2) Never done   DTaP/Tdap/Td vaccine (2 - Tdap) 04/17/2016   COVID-19 Vaccine (5 - 2023-24 season) 12/30/2021   Eye exam for diabetics  01/19/2022   Medicare Annual Wellness Visit  04/18/2022   Hemoglobin A1C  06/29/2022   Yearly kidney function blood test for diabetes  02/09/2023   Yearly kidney health urinalysis for diabetes  03/08/2023   Complete foot exam   03/08/2023   Mammogram  11/16/2023   Colon Cancer Screening  10/06/2029   Flu Shot  Completed   Hepatitis C Screening: USPSTF Recommendation to screen - Ages 18-79 yo.  Completed   HIV Screening  Completed   HPV Vaccine  Aged Out   Pap Smear  Discontinued

## 2022-04-19 NOTE — Progress Notes (Signed)
Subjective:  This is a telephone encounter between Angela [name] and Angela [name] on 04/19/2022 for AWV. The visit was conducted with the patient located at home and Angela [name] at [facility]. The patient's identity was confirmed using their DOB and current address. The patient has consented to being evaluated through a telephone encounter and understands the associated risks (an examination cannot be done and the patient may need to come in for an appointment) / benefits (allows the patient to remain at home, decreasing exposure to coronavirus).     Angela [name] is a 61 y.o. female who presents for Medicare Annual (Subsequent) preventive examination.  Review of Systems    Review of Systems  All other systems reviewed and are negative.   Objective:    There were no vitals filed for this visit. There is no height or weight on file to calculate BMI.     04/19/2022   11:14 AM 04/18/2021    8:53 AM 03/01/2021    3:30 PM 04/15/2020    8:39 AM 07/07/2019    7:41 AM 05/06/2018   12:27 PM 05/05/2018    9:54 PM  Advanced Directives  Does Patient Have a Medical Advance Directive? No No No No Yes No Yes  Type of Personnel officer;Living will  Ewa Gentry  Does patient want to make changes to medical advance directive?     No - Patient declined    Copy of [place] in Chart?     No - copy requested  No - copy requested  Would patient like information on creating a medical advance directive?  Yes (ED - Information included in AVS) No - Patient declined No - Patient declined  No - Patient declined No - Patient declined    Current Medications (verified) Outpatient Encounter Medications as of 04/19/2022  Medication Sig   albuterol (VENTOLIN HFA) 108 (90 Base) MCG/ACT inhaler Inhale 2 puffs into the lungs every 6 (six) hours as needed for wheezing or shortness of breath.   amLODipine (NORVASC) 10 MG tablet Take 1 tablet by mouth  daily.   atorvastatin (LIPITOR) 80 MG tablet    B-D UF III MINI PEN NEEDLES 31G X 5 MM MISC Use to inject insulin twice daily   betamethasone, augmented, (DIPROLENE) 0.05 % lotion Apply 1 application. topically 2 (two) times daily.   blood glucose meter kit and supplies Dispense based on patient and insurance preference. Once daily testing dx. e11.9   calcipotriene (DOVONOX) 0.005 % ointment Apply 1 application topically daily.    clobetasol cream (TEMOVATE) 5.57 % Apply 1 application. topically 2 (two) times daily.   cloNIDine (CATAPRES - DOSED IN MG/24 HR) 0.3 mg/24hr patch APPLY 1 PATCH TO SKIN ONCE A WEEK   cloNIDine (CATAPRES) 0.3 MG tablet TAKE 1 TABLET (0.3 MG TOTAL) BY MOUTH AT BEDTIME.   cyclobenzaprine (FLEXERIL) 10 MG tablet TAKE 1 TABLET BY MOUTH THREE TIMES A DAY   diazepam (VALIUM) 5 MG tablet Take one tablet by mouth three times daily for spasm   DULoxetine (CYMBALTA) 60 MG capsule TAKE 1 CAPSULE BY MOUTH 2 TIMES DAILY.   ergocalciferol (VITAMIN D2) 1.25 MG (50000 UT) capsule Take 1 capsule (50,000 Units total) by mouth once a week. One capsule once weekly   esomeprazole (NEXIUM) 40 MG capsule TAKE 1 CAPSULE BY MOUTH EVERY DAY   fenofibrate (TRICOR) 48 MG tablet Take 1 tablet (48 mg total) by mouth daily.  furosemide (LASIX) 40 MG tablet TAKE 1 TABLET BY MOUTH EVERY DAY   gabapentin (NEURONTIN) 800 MG tablet TAKE 1 TABLET BY MOUTH 4 TIMES DAILY.   glipiZIDE (GLUCOTROL XL) 5 MG 24 hr tablet Take 1 tablet (5 mg total) by mouth daily with breakfast.   hydrocortisone (PROCTOSOL HC) 2.5 % rectal cream PLACE 1 APPLICATION RECTALLY 4 (FOUR) TIMES DAILY. FOR 12 DAYS   Insulin Lispro Prot & Lispro (HUMALOG 75/25 MIX) (75-25) 100 UNIT/ML Kwikpen Inject 40 Units into the skin in the morning and at bedtime.   ipratropium-albuterol (DUONEB) 0.5-2.5 (3) MG/3ML SOLN Take 3 mLs by nebulization every 6 (six) hours as needed.   KLOR-CON M20 20 MEQ tablet TAKE 1 TABLET BY MOUTH TWICE A DAY    Lancets (ONETOUCH DELICA PLUS [id number]) MISC USE TO TEST ONCE DAILY   levothyroxine (SYNTHROID) 75 MCG tablet Take 1 tablet (75 mcg total) by mouth daily.   loratadine (CLARITIN) 10 MG tablet Take 1 tablet by mouth daily.   metFORMIN (GLUCOPHAGE) 500 MG tablet Take 1 tablet (500 mg total) by mouth 2 (two) times daily with a meal.   metoprolol tartrate (LOPRESSOR) 25 MG tablet Take 1 tablet (25 mg total) by mouth 2 (two) times daily.   naloxone (NARCAN) nasal spray 4 mg/0.1 mL One spray into each nostril once as needed   nitroGLYCERIN (NITROSTAT) 0.4 MG SL tablet Place 1 tablet (0.4 mg total) under the tongue every 5 (five) minutes as needed for chest pain.   ONETOUCH VERIO test strip USE AS INSTRUCTED FOUR TIMES DAILY TESTING DX E11.65   oxyCODONE (OXYCONTIN) 40 mg 12 hr tablet Take 1 tablet (40 mg total) by mouth every 12 (twelve) hours.   oxyCODONE (OXYCONTIN) 40 mg 12 hr tablet Take 1 tablet (40 mg total) by mouth every 12 (twelve) hours.   oxyCODONE (OXYCONTIN) 40 mg 12 hr tablet Take 1 tablet (40 mg total) by mouth every 12 (twelve) hours.   [START ON 05/07/2022] oxyCODONE (OXYCONTIN) 40 mg 12 hr tablet Take one tablet by mouth twice daily for pain   [START ON 06/07/2023] oxyCODONE (OXYCONTIN) 40 mg 12 hr tablet Take 1 tablet (40 mg total) by mouth every 12 (twelve) hours.   spironolactone (ALDACTONE) 25 MG tablet TAKE 1 TABLET (25 MG TOTAL) BY MOUTH DAILY.   SYMBICORT 160-4.5 MCG/ACT inhaler Inhale 2 puffs into the lungs in the morning and at bedtime.   UNABLE TO FIND BD Ultra-Fine Mini Pen Needle 31 gauge x 3/16"  USE AS DIRECTED TWICE A DAY   UNABLE TO FIND OneTouch Verio test strips  USE AS INSTRUCTED FOUR TIMES DAILY TESTING DX E11.65   UNABLE TO FIND OneTouch Delica Plus Lancet 33 gauge  USE TO TEST ONCE DAILY   zolpidem (AMBIEN) 10 MG tablet Take 1 tablet (10 mg total) by mouth at bedtime.   zolpidem (AMBIEN) 10 MG tablet Take 1 tablet (10 mg total) by mouth at bedtime.    [DISCONTINUED] ezetimibe (ZETIA) 10 MG tablet Take 1 tablet (10 mg total) by mouth daily.   No facility-administered encounter medications on file as of 04/19/2022.    Allergies (verified) Latex, Morphine, Morphine and related, Other, Ace inhibitors, Aspirin, Losartan, Tapentadol, Adhesive [tape], and Tomato   History: Past Medical History:  Diagnosis Date   Allergy    Phreesia 06/02/2020   Anxiety    Arthritis    Asthma    Asthma    Phreesia 04/12/2020   Breast mass    left (monitor)  CHF (congestive heart failure) ([place])    Phreesia 04/12/2020   Coronary atherosclerosis of native coronary artery    Mild nonobstructive CAD by cardiac catheterization 2008 - Dr. Terrence Dupont   Degenerative disc disease    Depression    Depression    Phreesia 04/12/2020   Diabetes mellitus without complication ([place])    Phreesia 04/12/2020   Dysrhythmia    Essential hypertension, benign    Folliculitis 36/62/9476   GERD (gastroesophageal reflux disease)    H/O hiatal hernia    Helicobacter pylori gastritis 2008   Hypercholesterolemia    Hypertension    Hyperthyroidism    s/p radiation   Left-sided epistaxis 05/15/2018   Low back pain    Migraine    Nephrolithiasis    Recurring episodes since 2004   Neuromuscular disorder ([place])    Phreesia 04/12/2020   Severe obesity (BMI >= 40) (H[facility])    Sleep apnea    STOP BANG SCORE 6no cpap used   Thyroid disease    Phreesia 04/12/2020   Type 2 diabetes mellitus with diabetic neuropathy ([place])    Past Surgical History:  Procedure Laterality Date   [place]  2000 [facility]   INT HEMORRHOIDS/FISSURE   COLONOSCOPY  2003 [facility], CHANGE IN BOWEL HABITS   INT HEMORRHOIDS   COLONOSCOPY  2006 [facility]   INT HEMORRHOIDS   COLONOSCOPY  2007 [facility]   INT HEMORRHOIDS    CYSTOSCOPY WITH STENT PLACEMENT Left 07/25/2012   Procedure: CYSTOSCOPY, left retograde pyelogram WITH left ureteral  STENT PLACEMENT;  Surgeon: Claybon Jabs, MD;  Location: [facility];  Service: Urology;  Laterality: Left;   CYSTOSCOPY/RETROGRADE/URETEROSCOPY  12/04/2011   Procedure: CYSTOSCOPY/RETROGRADE/URETEROSCOPY;  Surgeon: Molli Hazard, MD;  Location: [facility];  Service: Urology;  Laterality: Right;   CYSTOSCOPY/RETROGRADE/URETEROSCOPY/STONE EXTRACTION WITH BASKET Left 09/03/2012   Procedure: CYSTOSCOPY/RETROGRADE pyelogram/digital URETEROSCOPY/STONE EXTRACTION WITH BASKET, left stent removal;  Surgeon: Molli Hazard, MD;  Location: [facility];  Service: Urology;  Laterality: Left;   ESOPHAGOGASTRODUODENOSCOPY  2008   LYY:TKPTW hiatal hernia./Normal esophagus without evidence of Barrett's mass, stricture, erosion or ulceration./Normal duodenal bulb and second portion of the duodenum./Diffuse erythema in the body and the antrum with occasional erosion.  Biopsies obtained via cold forceps to evaluate for H. pylori gastritis   FRACTURE SURGERY  1999   right clavicle   HOLMIUM LASER APPLICATION Left 10/04/6810   Procedure: HOLMIUM LASER APPLICATION;  Surgeon: Molli Hazard, MD;  Location: [facility];  Service: Urology;  Laterality: Left;   KNEE ARTHROSCOPY  10/04/2010,    right knee arthroscopy, dr Theda Sers   LEFT HEART CATH AND CORONARY ANGIOGRAPHY N/A 05/08/2018   Procedure: LEFT HEART CATH AND CORONARY ANGIOGRAPHY;  Surgeon: Troy Sine, MD;  Location: [place] [facility];  Service: Cardiovascular;  Laterality: N/A;   left knee arthroscopic surgery  1999   Left salphingectomy secondary to ectopic pregnancy  [place] CATH N/A 07/07/2019   Procedure: RIGHT HEART CATH;  Surgeon: Leonie Man, MD;  Location: [place] [facility];  Service: Cardiovascular;  Laterality: N/A;   rotary cuff  Right 05/14/2014   [facility]   SPINE SURGERY N/A  Phreesia 06/02/2020   UPPER GASTROINTESTINAL ENDOSCOPY  2008 abd pain   H. pylori gastritis   UPPER GASTROINTESTINAL ENDOSCOPY  [place]   PUD   Family History  Problem Relation Age of Onset   Heart disease Mother    Hypertension Mother    Cancer Mother        Cervical    Asthma Mother    Heart attack Father    Cancer Father        Prostate   Colon cancer Father        DECEASED AGE 14   Colon polyps Neg Hx    Social History   Socioeconomic History   Marital status: Single    Spouse name: Not on file   Number of children: 1   Years of education: 12   Highest education level: 12th grade  Occupational History   Occupation: Disable   Tobacco Use   Smoking status: Never   Smokeless tobacco: Never  Vaping Use   Vaping Use: Never used  Substance and Sexual Activity   Alcohol use: No   Drug use: No   Sexual activity: Not Currently    Partners: Male    Birth control/protection: Surgical    Comment: hyst  Other Topics Concern   Not on file  Social History Narrative   Lives alone    Social Determinants of Health   Financial Resource Strain: Low Risk  (04/18/2021)   Overall Financial Resource Strain (CARDIA)    Difficulty of Paying Living Expenses: Not very hard  Food Insecurity: No Food Insecurity (04/18/2021)   Hunger Vital Sign    Worried About Running Out of Food in the Last Year: Never true    Ran Out of Food in the Last Year: Never true  Transportation Needs: No Transportation Needs (04/18/2021)   PRAPARE - Hydrologist (Medical): No    Lack of Transportation (Non-Medical): No  Physical Activity: Sufficiently Active (04/18/2021)   Exercise Vital Sign    Days of Exercise per Week: 5 days    Minutes of Exercise per Session: 30 min  Stress: No Stress Concern Present (04/15/2020)   [place]    Feeling of Stress : Not at all  Social Connections: Socially Isolated  (04/18/2021)   Social Connection and Isolation Panel [NHANES]    Frequency of Communication with Friends and Family: More than three times a week    Frequency of Social Gatherings with Friends and Family: More than three times a week    Attends Religious Services: Never    Marine scientist or Organizations: No    Attends Music therapist: Never    Marital Status: Divorced    Tobacco Counseling Counseling given: Not Answered   Clinical Intake:  Pre-visit preparation completed: Yes  Pain : No/denies pain (Chronic pain , controlled on oxycodone)    How often do you need to have someone help you when you read instructions, pamphlets, or other written materials from your doctor or pharmacy?: 1 - Never What is the last grade level you completed in school?: high school  Diabetic?Yes   Activities of Daily Living    04/19/2022   11:18 AM  In your present state of health, do you have any difficulty performing the following activities:  Hearing? 0  Vision? 0  Difficulty concentrating or making decisions? 0  Walking or climbing stairs? 1  Dressing or bathing? 1  Doing errands, shopping? 1    Patient Care Team: Fayrene Helper, MD as PCP - General Elouise Munroe, MD as PCP - Cardiology (Cardiology) Danie Binder, MD (Inactive) (Gastroenterology) Rolan Bucco, MD as Attending Physician (Urology) Sydnee Cabal, MD as Consulting Physician (Orthopedic Surgery) [place], Clarene Duke, OT as Occupational Therapist (Occupational Therapy)  Indicate any recent Medical Services you may have received from other than [facility] providers in the past year (date may be approximate).     Assessment:   This is a routine wellness examination for Angela.  Hearing/Vision screen No results found.  Dietary issues and exercise activities discussed:     Goals Addressed   None    Depression Screen    04/19/2022   11:16 AM 03/07/2022   10:24 AM 07/01/2021    10:59 AM 07/01/2021   10:15 AM 06/09/2021   12:03 PM 06/09/2021   11:43 AM 03/03/2021    8:42 AM  PHQ 2/9 Scores  PHQ - 2 Score 0 3 2 0 1 0 0  PHQ- 9 Score  8 6    0    Fall Risk    04/19/2022   11:15 AM 03/07/2022   10:23 AM 12/29/2021   10:33 AM 09/23/2021   10:24 AM 07/01/2021   10:14 AM  Fall Risk   Falls in the past year? 0 0 0 1 0  Number falls in past yr: 0 0 0 0 0  Injury with Fall? 0 0 0 0 0  Risk for fall due to :  No Fall Risks Impaired mobility  No Fall Risks  Follow up  Falls evaluation completed   Falls evaluation completed    FALL RISK PREVENTION PERTAINING TO THE HOME:  Any stairs in or around the home? No  If so, are there any without handrails? No  Home free of loose throw rugs in walkways, pet beds, electrical cords, etc? No  Adequate lighting in your home to reduce risk of falls? Yes   ASSISTIVE DEVICES UTILIZED TO PREVENT FALLS:  Life alert? Yes  Use of a cane, walker or w/c? Yes  Grab bars in the bathroom? Yes  Shower chair or bench in shower? Yes  Elevated toilet seat or a handicapped toilet? Yes    Cognitive Function:        04/19/2022   11:17 AM 04/18/2021    8:53 AM 04/15/2019    9:00 AM 04/10/2018   10:17 AM 05/03/2016    4:10 PM  6CIT Screen  What Year? 0 points 0 points 0 points 0 points 0 points  What month? 0 points 0 points 0 points 0 points 0 points  What time? 0 points 0 points 0 points 0 points 0 points  Count back from 20 0 points 0 points 0 points 0 points 0 points  Months in reverse 0 points 0 points 0 points 0 points 0 points  Repeat phrase 0 points 0 points 0 points 4 points 0 points  Total Score 0 points 0 points 0 points 4 points 0 points    Immunizations Immunization History  Administered Date(s) Administered   Influenza Split 03/30/2011   Influenza Whole 03/21/2005, 02/09/2006   Influenza,inj,Quad PF,6+ Mos 02/11/2014, 07/27/2015, 03/09/2016, 01/29/2017, 02/07/2018, 01/30/2019, 02/11/2020, 03/03/2021, 12/29/2021    Influenza-Unspecified 01/29/2017   PFIZER(Purple Top)SARS-COV-2 Vaccination 07/22/2019, 08/12/2019, 03/05/2020, 11/19/2020   Pneumococcal Polysaccharide-23 04/17/2006, 09/14/2015   Td 04/17/2006    TDAP status: Due, Education has been provided regarding the importance of this vaccine. Advised may  receive this vaccine at local pharmacy or Health Dept. Aware to provide a copy of the vaccination record if obtained from local pharmacy or Health Dept. Verbalized acceptance and understanding.  Flu Vaccine status: Up to date  Pneumococcal vaccine status: Up to date  Covid-19 vaccine status: Information provided on how to obtain vaccines.   Qualifies for Shingles Vaccine? Yes   Zostavax completed No   Shingrix Completed?: No.    Education has been provided regarding the importance of this vaccine. Patient has been advised to call insurance company to determine out of pocket expense if they have not yet received this vaccine. Advised may also receive vaccine at local pharmacy or Health Dept. Verbalized acceptance and understanding.  Screening Tests Health Maintenance  Topic Date Due   Zoster Vaccines- Shingrix (1 of 2) Never done   DTaP/Tdap/Td (2 - Tdap) 04/17/2016   COVID-19 Vaccine (5 - 2023-24 season) 12/30/2021   OPHTHALMOLOGY EXAM  01/19/2022   Medicare Annual Wellness (AWV)  04/18/2022   HEMOGLOBIN A1C  06/29/2022   Diabetic kidney evaluation - eGFR measurement  02/09/2023   Diabetic kidney evaluation - Urine ACR  03/08/2023   FOOT EXAM  03/08/2023   MAMMOGRAM  11/16/2023   COLONOSCOPY (Pts 45-26yr Insurance coverage will need to be confirmed)  10/06/2029   INFLUENZA VACCINE  Completed   Hepatitis C Screening  Completed   HIV Screening  Completed   HPV VACCINES  Aged Out   PAP SMEAR-Modifier  Discontinued    Health Maintenance  Health Maintenance Due  Topic Date Due   Zoster Vaccines- Shingrix (1 of 2) Never done   DTaP/Tdap/Td (2 - Tdap) 04/17/2016   COVID-19 Vaccine (5 -  2023-24 season) 12/30/2021   OPHTHALMOLOGY EXAM  01/19/2022   Medicare Annual Wellness (AWV)  04/18/2022    Colorectal cancer screening: Type of screening: Colonoscopy. Completed 10/07/19. Repeat every 10 years  Mammogram status: Completed 11/15/2021. Repeat every 2 years  Lung Cancer Screening: (Low Dose CT Chest recommended if Age 61-80years, 30 pack-year currently smoking OR have quit w/in 15years.) does not qualify.   Additional Screening:  Hepatitis C Screening: does qualify; Completed 11/24/2020  Vision Screening: Recommended annual ophthalmology exams for early detection of glaucoma and other disorders of the eye. Is the patient up to date with their annual eye exam?  Yes  Who is the provider or what is the name of the office in which the patient attends annual eye exams? [facility] If pt is not established with a provider, would they like to be referred to a provider to establish care? No .   Dental Screening: Recommended annual dental exams for proper oral hygiene  Community Resource Referral / Chronic Care Management: CRR required this visit?  No   CCM required this visit?  No      Plan:     I have personally reviewed and noted the following in the patient's chart:   Medical and social history Use of alcohol, tobacco or illicit drugs  Current medications and supplements including opioid prescriptions. Patient is currently taking opioid prescriptions. Information provided to patient regarding non-opioid alternatives. Patient advised to discuss non-opioid treatment plan with their provider. Functional ability and status Nutritional status Physical activity Advanced directives List of other physicians Hospitalizations, surgeries, and ER visits in previous 12 months Vitals Screenings to include cognitive, depression, and falls Referrals and appointments  In addition, I have reviewed and discussed with patient certain preventive protocols, quality metrics, and  best practice recommendations.  A written personalized care plan for preventive services as well as general preventive health recommendations were provided to patient.     Angela Dy, MD   04/19/2022

## 2022-04-25 ENCOUNTER — Other Ambulatory Visit: Payer: Self-pay | Admitting: Family Medicine

## 2022-04-25 DIAGNOSIS — M62830 Muscle spasm of back: Secondary | ICD-10-CM

## 2022-04-27 NOTE — Telephone Encounter (Signed)
Noted- further refills will come from [facility].

## 2022-04-28 ENCOUNTER — Telehealth: Payer: Self-pay | Admitting: Nurse Practitioner

## 2022-04-28 DIAGNOSIS — E1159 Type 2 diabetes mellitus with other circulatory complications: Secondary | ICD-10-CM

## 2022-04-28 MED ORDER — INSULIN LISPRO PROT & LISPRO (75-25 MIX) 100 UNIT/ML KWIKPEN: 40.0000 [IU] | PEN_INJECTOR | Freq: Two times a day (BID) | SUBCUTANEOUS | 0 refills | Status: DC

## 2022-04-28 NOTE — Telephone Encounter (Signed)
done

## 2022-04-28 NOTE — Telephone Encounter (Signed)
Pt left her Insulin at home - she is out of town. Can you call one in for her? Insulin Lispro Prot & Lispro (HUMALOG 75/25 MIX) (75-25) 100 UNIT/ML Kwikpen   [facility] [address] [place] [address]

## 2022-05-02 ENCOUNTER — Other Ambulatory Visit: Payer: Self-pay | Admitting: Family Medicine

## 2022-05-04 DIAGNOSIS — M5416 Radiculopathy, lumbar region: Secondary | ICD-10-CM | POA: Diagnosis not present

## 2022-05-05 ENCOUNTER — Other Ambulatory Visit: Payer: Self-pay | Admitting: Family Medicine

## 2022-05-06 DIAGNOSIS — R0609 Other forms of dyspnea: Secondary | ICD-10-CM | POA: Diagnosis not present

## 2022-05-10 DIAGNOSIS — E119 Type 2 diabetes mellitus without complications: Secondary | ICD-10-CM | POA: Diagnosis not present

## 2022-05-10 DIAGNOSIS — Z7984 Long term (current) use of oral hypoglycemic drugs: Secondary | ICD-10-CM | POA: Diagnosis not present

## 2022-05-10 DIAGNOSIS — H2513 Age-related nuclear cataract, bilateral: Secondary | ICD-10-CM | POA: Diagnosis not present

## 2022-05-10 DIAGNOSIS — Z794 Long term (current) use of insulin: Secondary | ICD-10-CM | POA: Diagnosis not present

## 2022-05-10 LAB — HM DIABETES EYE EXAM

## 2022-05-17 ENCOUNTER — Other Ambulatory Visit: Payer: Self-pay | Admitting: Family Medicine

## 2022-05-17 DIAGNOSIS — I10 Essential (primary) hypertension: Secondary | ICD-10-CM

## 2022-05-26 ENCOUNTER — Telehealth: Payer: Self-pay | Admitting: Family Medicine

## 2022-05-26 NOTE — Telephone Encounter (Signed)
Patient aware and agreed

## 2022-05-26 NOTE — Telephone Encounter (Signed)
Pt called stating she is unable to get in contact with previous therapist. She needs to talk to someone asap. She is having some issues. I've tried to find info for the therapist from the referral and can't find a number. Pt is going to call back in a little while. Can she please get a STAT referral to psychiatry? She wants to speak to someone today.

## 2022-06-02 ENCOUNTER — Other Ambulatory Visit: Payer: Self-pay | Admitting: Family Medicine

## 2022-06-06 ENCOUNTER — Ambulatory Visit (INDEPENDENT_AMBULATORY_CARE_PROVIDER_SITE_OTHER): Payer: 59 | Admitting: Nurse Practitioner

## 2022-06-06 ENCOUNTER — Encounter: Payer: Self-pay | Admitting: Nurse Practitioner

## 2022-06-06 VITALS — BP 134/78 | HR 88 | Ht 64.0 in | Wt 309.2 lb

## 2022-06-06 DIAGNOSIS — E782 Mixed hyperlipidemia: Secondary | ICD-10-CM

## 2022-06-06 DIAGNOSIS — E039 Hypothyroidism, unspecified: Secondary | ICD-10-CM

## 2022-06-06 DIAGNOSIS — I10 Essential (primary) hypertension: Secondary | ICD-10-CM

## 2022-06-06 DIAGNOSIS — E1159 Type 2 diabetes mellitus with other circulatory complications: Secondary | ICD-10-CM | POA: Diagnosis not present

## 2022-06-06 DIAGNOSIS — R0609 Other forms of dyspnea: Secondary | ICD-10-CM | POA: Diagnosis not present

## 2022-06-06 DIAGNOSIS — E559 Vitamin D deficiency, unspecified: Secondary | ICD-10-CM | POA: Diagnosis not present

## 2022-06-06 LAB — POCT GLYCOSYLATED HEMOGLOBIN (HGB A1C): Hemoglobin A1C: 10.4 % — AB (ref 4.0–5.6)

## 2022-06-06 MED ORDER — TIRZEPATIDE 5 MG/0.5ML ~~LOC~~ SOAJ: 5.0000 mg | SUBCUTANEOUS | 1 refills | Status: DC

## 2022-06-06 NOTE — Patient Instructions (Signed)

## 2022-06-06 NOTE — Progress Notes (Signed)
06/06/2022, 2:34 PM                    Endocrinology follow-up note   Subjective:    [name], female    DOB: [birth year].  [name] is being seen in follow-up in the management of currently uncontrolled symptomatic type 2 diabetes.  She also has comorbid dyslipidemia, hypertension.   PMD:  Angela Helper, MD.   Past Medical History:  Diagnosis Date   Allergy    Phreesia 06/02/2020   Anxiety    Arthritis    Asthma    Asthma    Phreesia 04/12/2020   Breast mass    left (monitor)   CHF (congestive heart failure) ([place])    Phreesia 04/12/2020   Coronary atherosclerosis of native coronary artery    Mild nonobstructive CAD by cardiac catheterization 2008 - Dr. Terrence [name]   Degenerative disc disease    Depression    Depression    Phreesia 04/12/2020   Diabetes mellitus without complication ([place])    Phreesia 04/12/2020   Dysrhythmia    Essential hypertension, benign    Folliculitis 41/96/2229   GERD (gastroesophageal reflux disease)    H/O hiatal hernia    Helicobacter pylori gastritis 2008   Hypercholesterolemia    Hypertension    Hyperthyroidism    s/p radiation   Left-sided epistaxis 05/15/2018   Low back pain    Migraine    Nephrolithiasis    Recurring episodes since 2004   Neuromuscular disorder ([place])    Phreesia 04/12/2020   Severe obesity (BMI >= 40) (HCC)    Sleep apnea    STOP BANG SCORE 6no cpap used   Thyroid disease    Phreesia 04/12/2020   Type 2 diabetes mellitus with diabetic neuropathy ([place])     Past Surgical History:  Procedure Laterality Date   [place]  2000 [facility]   INT HEMORRHOIDS/FISSURE   COLONOSCOPY  2003 [facility], CHANGE IN BOWEL HABITS   INT HEMORRHOIDS   COLONOSCOPY  2006 [facility]   INT HEMORRHOIDS    COLONOSCOPY  2007 [facility]   INT HEMORRHOIDS   CYSTOSCOPY WITH STENT PLACEMENT Left 07/25/2012   Procedure: CYSTOSCOPY, left retograde pyelogram WITH left ureteral  STENT PLACEMENT;  Surgeon: Angela Jabs, MD;  Location: [facility];  Service: Urology;  Laterality: Left;   CYSTOSCOPY/RETROGRADE/URETEROSCOPY  12/04/2011   Procedure: CYSTOSCOPY/RETROGRADE/URETEROSCOPY;  Surgeon: Angela Hazard, MD;  Location: [facility];  Service: Urology;  Laterality: Right;   CYSTOSCOPY/RETROGRADE/URETEROSCOPY/STONE EXTRACTION WITH BASKET Left 09/03/2012   Procedure: CYSTOSCOPY/RETROGRADE pyelogram/digital URETEROSCOPY/STONE EXTRACTION WITH BASKET, left stent removal;  Surgeon: Angela Hazard, MD;  Location: [facility];  Service: Urology;  Laterality: Left;   ESOPHAGOGASTRODUODENOSCOPY  2008   NLG:XQJJH hiatal hernia./Normal esophagus without evidence of Barrett's mass, stricture, erosion or ulceration./Normal duodenal bulb and second portion of the duodenum./Diffuse erythema in the body and the antrum with occasional erosion.  Biopsies obtained via cold forceps to evaluate for H. pylori gastritis   FRACTURE SURGERY  1999   right clavicle   HOLMIUM LASER APPLICATION Left 07/31/7406   Procedure: HOLMIUM LASER APPLICATION;  Surgeon: Angela Hazard, MD;  Location: [facility];  Service: Urology;  Laterality: Left;   KNEE ARTHROSCOPY  10/04/2010,    right knee arthroscopy, dr Angela [name]   LEFT HEART CATH AND CORONARY ANGIOGRAPHY N/A 05/08/2018   Procedure: LEFT HEART CATH AND CORONARY ANGIOGRAPHY;  Surgeon: Angela Sine, MD;  Location: [place] [facility];  Service: Cardiovascular;  Laterality: N/A;   left knee arthroscopic surgery  1999   Left salphingectomy secondary to ectopic pregnancy  [place] CATH N/A 07/07/2019   Procedure: RIGHT HEART CATH;  Surgeon: Leonie Man, MD;  Location: [place] [facility];  Service: Cardiovascular;  Laterality: N/A;   rotary cuff  Right  05/14/2014   [facility]   SPINE SURGERY N/A    Phreesia 06/02/2020   UPPER GASTROINTESTINAL ENDOSCOPY  2008 abd pain   H. pylori gastritis   UPPER GASTROINTESTINAL ENDOSCOPY  1996 [place] [place]   PUD    Social History   Socioeconomic History   Marital status: Single    Spouse name: Not on file   Number of children: 1   Years of education: 12   Highest education level: 12th grade  Occupational History   Occupation: Disable   Tobacco Use   Smoking status: Never   Smokeless tobacco: Never  Vaping Use   Vaping Use: Never used  Substance and Sexual Activity   Alcohol use: No   Drug use: No   Sexual activity: Not Currently    Partners: Male    Birth control/protection: Surgical    Comment: hyst  Other Topics Concern   Not on file  Social History Narrative   Lives alone    Social Determinants of Health   Financial Resource Strain: Low Risk  (04/18/2021)   Overall Financial Resource Strain (CARDIA)    Difficulty of Paying Living Expenses: Not very hard  Food Insecurity: No Food Insecurity (04/18/2021)   Hunger Vital Sign    Worried About Running Out of Food in the Last Year: Never true    Ran Out of Food in the Last Year: Never true  Transportation Needs: No Transportation Needs (04/18/2021)   PRAPARE - Hydrologist (Medical): No    Lack of Transportation (Non-Medical): No  Physical Activity: Sufficiently Active (04/18/2021)   Exercise Vital Sign    Days of Exercise per Week: 5 days    Minutes of Exercise per Session: 30 min  Stress: No Stress Concern Present (04/15/2020)   [place]    Feeling of Stress : Not at all  Social Connections: Socially Isolated (04/18/2021)   Social Connection and Isolation Panel [NHANES]    Frequency of Communication with Friends and Family: More than three times a week    Frequency of Social Gatherings with Friends and Family: More than three times  a week    Attends Religious Services: Never    Marine scientist or Organizations: No    Attends Archivist Meetings: Never    Marital Status: Divorced    Family History  Problem Relation Age of Onset   Heart disease Mother    Hypertension Mother    Cancer Mother        Cervical    Asthma Mother    Heart attack Father    Cancer Father        Prostate   Colon cancer Father        DECEASED AGE 12   Colon polyps Neg  Hx     Outpatient Encounter Medications as of 06/06/2022  Medication Sig   albuterol (VENTOLIN HFA) 108 (90 Base) MCG/ACT inhaler Inhale 2 puffs into the lungs every 6 (six) hours as needed for wheezing or shortness of breath.   amLODipine (NORVASC) 10 MG tablet TAKE 1 TABLET BY MOUTH EVERY DAY   atorvastatin (LIPITOR) 80 MG tablet    B-D UF III MINI PEN NEEDLES 31G X 5 MM MISC Use to inject insulin twice daily   betamethasone, augmented, (DIPROLENE) 0.05 % lotion Apply 1 application. topically 2 (two) times daily.   blood glucose meter kit and supplies Dispense based on patient and insurance preference. Once daily testing dx. e11.9   calcipotriene (DOVONOX) 0.005 % ointment Apply 1 application topically daily.    clobetasol cream (TEMOVATE) 8.52 % Apply 1 application. topically 2 (two) times daily.   cloNIDine (CATAPRES - DOSED IN MG/24 HR) 0.3 mg/24hr patch APPLY 1 PATCH TO SKIN ONCE A WEEK   cloNIDine (CATAPRES) 0.3 MG tablet TAKE 1 TABLET (0.3 MG TOTAL) BY MOUTH AT BEDTIME.   cyclobenzaprine (FLEXERIL) 10 MG tablet TAKE 1 TABLET BY MOUTH THREE TIMES A DAY   diazepam (VALIUM) 5 MG tablet TAKE ONE TABLET BY MOUTH THREE TIMES DAILY FOR SPASM   DULoxetine (CYMBALTA) 60 MG capsule TAKE 1 CAPSULE BY MOUTH TWICE A DAY   ergocalciferol (VITAMIN D2) 1.25 MG (50000 UT) capsule Take 1 capsule (50,000 Units total) by mouth once a week. One capsule once weekly   esomeprazole (NEXIUM) 40 MG capsule TAKE 1 CAPSULE BY MOUTH EVERY DAY   fenofibrate (TRICOR) 48 MG  tablet Take 1 tablet (48 mg total) by mouth daily.   furosemide (LASIX) 40 MG tablet TAKE 1 TABLET BY MOUTH EVERY DAY   gabapentin (NEURONTIN) 800 MG tablet TAKE 1 TABLET BY MOUTH 4 TIMES DAILY.   glipiZIDE (GLUCOTROL XL) 5 MG 24 hr tablet Take 1 tablet (5 mg total) by mouth daily with breakfast.   hydrocortisone (PROCTOSOL HC) 2.5 % rectal cream PLACE 1 APPLICATION RECTALLY 4 (FOUR) TIMES DAILY. FOR 12 DAYS   Insulin Lispro Prot & Lispro (HUMALOG 75/25 MIX) (75-25) 100 UNIT/ML Kwikpen Inject 40 Units into the skin in the morning and at bedtime.   ipratropium-albuterol (DUONEB) 0.5-2.5 (3) MG/3ML SOLN Take 3 mLs by nebulization every 6 (six) hours as needed.   KLOR-CON M20 20 MEQ tablet TAKE 1 TABLET BY MOUTH TWICE A DAY   Lancets (ONETOUCH DELICA PLUS [id number]) MISC USE TO TEST ONCE DAILY   levothyroxine (SYNTHROID) 75 MCG tablet Take 1 tablet (75 mcg total) by mouth daily.   loratadine (CLARITIN) 10 MG tablet TAKE 1 TABLET BY MOUTH EVERY DAY   metFORMIN (GLUCOPHAGE) 500 MG tablet Take 1 tablet (500 mg total) by mouth 2 (two) times daily with a meal.   metoprolol tartrate (LOPRESSOR) 25 MG tablet Take 1 tablet (25 mg total) by mouth 2 (two) times daily.   naloxone (NARCAN) nasal spray 4 mg/0.1 mL One spray into each nostril once as needed   nitroGLYCERIN (NITROSTAT) 0.4 MG SL tablet Place 1 tablet (0.4 mg total) under the tongue every 5 (five) minutes as needed for chest pain.   ONETOUCH VERIO test strip USE AS INSTRUCTED FOUR TIMES DAILY TESTING DX E11.65   oxyCODONE (OXYCONTIN) 40 mg 12 hr tablet Take 1 tablet (40 mg total) by mouth every 12 (twelve) hours.   oxyCODONE (OXYCONTIN) 40 mg 12 hr tablet Take 1 tablet (40 mg total) by mouth every  12 (twelve) hours.   oxyCODONE (OXYCONTIN) 40 mg 12 hr tablet Take 1 tablet (40 mg total) by mouth every 12 (twelve) hours.   oxyCODONE (OXYCONTIN) 40 mg 12 hr tablet Take one tablet by mouth twice daily for pain   [START ON 06/07/2023] oxyCODONE  (OXYCONTIN) 40 mg 12 hr tablet Take 1 tablet (40 mg total) by mouth every 12 (twelve) hours.   spironolactone (ALDACTONE) 25 MG tablet TAKE 1 TABLET (25 MG TOTAL) BY MOUTH DAILY.   SYMBICORT 160-4.5 MCG/ACT inhaler Inhale 2 puffs into the lungs in the morning and at bedtime.   tirzepatide [facility]) 5 MG/0.5ML Pen Inject 5 mg into the skin once a week.   UNABLE TO FIND BD Ultra-Fine Mini Pen Needle 31 gauge x 3/16"  USE AS DIRECTED TWICE A DAY   UNABLE TO FIND OneTouch Verio test strips  USE AS INSTRUCTED FOUR TIMES DAILY TESTING DX E11.65   UNABLE TO FIND OneTouch Delica Plus Lancet 33 gauge  USE TO TEST ONCE DAILY   zolpidem (AMBIEN) 10 MG tablet Take 1 tablet (10 mg total) by mouth at bedtime.   zolpidem (AMBIEN) 10 MG tablet Take 1 tablet (10 mg total) by mouth at bedtime.   [DISCONTINUED] ezetimibe (ZETIA) 10 MG tablet Take 1 tablet (10 mg total) by mouth daily.   No facility-administered encounter medications on file as of 06/06/2022.    ALLERGIES: Allergies  Allergen Reactions   Latex Itching and Rash   Morphine Other (See Comments)    Reaction with esophagus, unable to swallow.    Morphine And Related Shortness Of Breath and Swelling   Other Itching, Shortness Of Breath and Swelling   Ace Inhibitors Cough   Aspirin Other (See Comments)    Reaction with esophagus, unable to swallow.    Losartan Cough   Tapentadol Other (See Comments)    Nausea, increased sleepiness, h/a   Adhesive [Tape] Rash   Tomato Other (See Comments)    Acid reflux due to acid in tomato    VACCINATION STATUS: Immunization History  Administered Date(s) Administered   Influenza Split 03/30/2011   Influenza Whole 03/21/2005, 02/09/2006   Influenza,inj,Quad PF,6+ Mos 02/11/2014, 07/27/2015, 03/09/2016, 01/29/2017, 02/07/2018, 01/30/2019, 02/11/2020, 03/03/2021, 12/29/2021   Influenza-Unspecified 01/29/2017   PFIZER(Purple Top)SARS-COV-2 Vaccination 07/22/2019, 08/12/2019, 03/05/2020, 11/19/2020    Pneumococcal Polysaccharide-23 04/17/2006, 09/14/2015   Td 04/17/2006    Diabetes She presents for her follow-up diabetic visit. She has type 2 diabetes mellitus. Her disease course has been stable. There are no hypoglycemic associated symptoms. Pertinent negatives for hypoglycemia include no confusion, headaches, pallor or seizures. Associated symptoms include fatigue. Pertinent negatives for diabetes include no blurred vision, no chest pain, no polydipsia, no polyphagia, no polyuria and no weight loss. There are no hypoglycemic complications. Symptoms are stable. Diabetic complications include heart disease, nephropathy and peripheral neuropathy. Risk factors for coronary artery disease include dyslipidemia, diabetes mellitus, family history, hypertension, obesity, sedentary lifestyle, tobacco exposure and post-menopausal. Current diabetic treatment includes insulin injections and oral agent (dual therapy). She is compliant with treatment most of the time. Her weight is fluctuating minimally. She is following a generally healthy diet. When asked about meal planning, she reported none. She has had a previous visit with a dietitian. She never participates in exercise. Her home blood glucose trend is fluctuating minimally. Her overall blood glucose range is >200 mg/dl. (She presents today with her meter and logs showing gross hyperglycemia overall.  Her POCT A1c today is 10.4%, increasing slightly from last visit  of 10.2%.  She reports she went on a girls trip back home up [place] and cheated on her diet.  Analysis of her meter shows 7-day average of 222, 14-day average of 228, 30-day average of 225, 90-day average of 211.  She denies any hypoglycemia.) An ACE inhibitor/angiotensin II receptor blocker is contraindicated. She does not see a podiatrist.Eye exam is not current.  Hyperlipidemia This is a chronic problem. The current episode started more than 1 year ago. The problem is controlled. Recent lipid tests  were reviewed and are normal. Exacerbating diseases include chronic renal disease, diabetes, hypothyroidism and obesity. Factors aggravating her hyperlipidemia include beta blockers and thiazides. Pertinent negatives include no chest pain, myalgias or shortness of breath. Current antihyperlipidemic treatment includes statins. The current treatment provides mild improvement of lipids. Compliance problems include adherence to diet and adherence to exercise.  Risk factors for coronary artery disease include diabetes mellitus, dyslipidemia, hypertension, obesity and a sedentary lifestyle.  Hypertension This is a chronic problem. The current episode started more than 1 year ago. The problem has been gradually improving since onset. The problem is controlled. Associated symptoms include peripheral edema. Pertinent negatives include no blurred vision, chest pain, headaches, palpitations or shortness of breath. Agents associated with hypertension include thyroid hormones. Risk factors for coronary artery disease include family history, dyslipidemia, diabetes mellitus, obesity, sedentary lifestyle and post-menopausal state. Past treatments include calcium channel blockers, diuretics and beta blockers. The current treatment provides moderate improvement. Compliance problems include diet and exercise.  Hypertensive end-organ damage includes kidney disease, CAD/MI and heart failure. Identifiable causes of hypertension include chronic renal disease and sleep apnea.    Review of systems  Constitutional: + minimally fluctuating body weight,  current Body mass index is 53.07 kg/m. , + fatigue, no subjective hyperthermia, no subjective hypothermia Eyes: no blurry vision, no xerophthalmia ENT: no sore throat, no nodules palpated in throat, no dysphagia/odynophagia, no hoarseness Cardiovascular: no chest pain, no shortness of breath, no palpitations, no leg swelling Respiratory: no cough, no SOB Gastrointestinal: no  nausea/vomiting/diarrhea Musculoskeletal: no muscle/joint aches, walks with cane d/t hx of falls Skin: no rashes, no hyperemia Neurological: no tremors, no numbness, no tingling, no dizziness Psychiatric: no depression, no anxiety,    Objective:    BP 134/78 (BP Location: Left Arm, Patient Position: Sitting, Cuff Size: Large)   Pulse 88   Ht '5\' 4"'$  (1.626 m)   Wt (!) 309 lb 3.2 oz (140.3 kg)   BMI 53.07 kg/m   Wt Readings from Last 3 Encounters:  06/06/22 (!) 309 lb 3.2 oz (140.3 kg)  03/07/22 (!) 307 lb (139.3 kg)  03/06/22 (!) 306 lb 9.6 oz (139.1 kg)    BP Readings from Last 3 Encounters:  06/06/22 134/78  03/07/22 136/79  03/06/22 131/81     Physical Exam- Limited  Constitutional:  Body mass index is 53.07 kg/m. , not in acute distress, normal state of mind Eyes:  EOMI, no exophthalmos Musculoskeletal: no gross deformities, strength intact in all four extremities, no gross restriction of joint movements, walks with cane due to disequilibrium and hx of falls Skin:  no rashes, no hyperemia Neurological: no tremor with outstretched hands   CMP ( most recent) CMP     Component Value Date/Time   NA 136 02/08/2022 0821   K 4.5 02/08/2022 0821   CL 98 02/08/2022 0821   CO2 24 02/08/2022 0821   GLUCOSE 244 (H) 02/08/2022 0821   GLUCOSE 316 (H) 02/11/2020 1111   BUN  17 02/08/2022 0821   CREATININE 0.87 02/08/2022 0821   CREATININE 0.91 02/11/2020 1111   CALCIUM 9.9 02/08/2022 0821   PROT 7.4 02/08/2022 0821   ALBUMIN 4.4 02/08/2022 0821   AST 25 02/08/2022 0821   ALT 24 02/08/2022 0821   ALKPHOS 79 02/08/2022 0821   BILITOT 0.2 02/08/2022 0821   GFRNONAA 70 05/06/2020 1201   GFRNONAA 69 02/11/2020 1111   GFRAA 80 05/06/2020 1201   GFRAA 80 02/11/2020 1111    Diabetic Labs (most recent): Lab Results  Component Value Date   HGBA1C 10.4 (A) 06/06/2022   HGBA1C 10.2 (H) 12/29/2021   HGBA1C 9.5 (A) 09/23/2021   MICROALBUR 101.9 (H) 11/10/2019   MICROALBUR  2.9 10/08/2017   MICROALBUR 33.7 (H) 07/06/2017     Lipid Panel ( most recent) Lipid Panel     Component Value Date/Time   CHOL 195 12/29/2021 1130   TRIG 182 (H) 12/29/2021 1130   HDL 39 (L) 12/29/2021 1130   CHOLHDL 5.0 (H) 12/29/2021 1130   CHOLHDL 4.0 11/10/2019 1303   VLDL 16 05/06/2018 0335   LDLCALC 124 (H) 12/29/2021 1130   LDLCALC 97 11/10/2019 1303      Assessment & Plan:   1) Uncontrolled type 2 diabetes mellitus with hyperglycemia ([place])  - Angela [name] has currently uncontrolled symptomatic type 2 DM since  62 years of age.  She presents today with her meter and logs showing gross hyperglycemia overall.  Her POCT A1c today is 10.4%, increasing slightly from last visit of 10.2%.  She reports she went on a girls trip back home up [place] and cheated on her diet.  Analysis of her meter shows 7-day average of 222, 14-day average of 228, 30-day average of 225, 90-day average of 211.  She denies any hypoglycemia.   Recent labs reviewed.  -her diabetes is complicated by coronary artery disease, CHF, obesity/sedentary life and she remains at a high risk for more acute and chronic complications which include CAD, CVA, CKD, retinopathy, and neuropathy. These are all discussed in detail with her.  The following Lifestyle Medicine recommendations according to [facility]) were discussed and offered to patient and she agrees to start the journey:  A. Whole Foods, Plant-based plate comprising of fruits and vegetables, plant-based proteins, whole-grain carbohydrates was discussed in detail with the patient.   A list for source of those nutrients were also provided to the patient.  Patient will use only water or unsweetened tea for hydration. B.  The need to stay away from risky substances including alcohol, smoking; obtaining 7 to 9 hours of restorative sleep, at least 150 minutes of moderate intensity exercise weekly, the importance of healthy social  connections,  and stress reduction techniques were discussed. C.  A full color page of  Calorie density of various food groups per pound showing examples of each food groups was provided to the patient.  - Nutritional counseling repeated at each appointment due to patients tendency to fall back in to old habits.  - The patient admits there is a room for improvement in their diet and drink choices. -  Suggestion is made for the patient to avoid simple carbohydrates from their diet including Cakes, Sweet Desserts / Pastries, Ice Cream, Soda (diet and regular), Sweet Tea, Candies, Chips, Cookies, Sweet Pastries, Store Bought Juices, Alcohol in Excess of 1-2 drinks a day, Artificial Sweeteners, Coffee Creamer, and "Sugar-free" Products. This will help patient to have stable blood glucose profile and potentially  avoid unintended weight gain.   - I encouraged the patient to switch to unprocessed or minimally processed complex starch and increased protein intake (animal or plant source), fruits, and vegetables.   - Patient is advised to stick to a routine mealtimes to eat 3 meals a day and avoid unnecessary snacks (to snack only to correct hypoglycemia).  - I have approached her with the following individualized plan to manage diabetes and patient agrees:   -Given her presentation with chronic glycemic burden, she will likely require multiple daily injections of insulin in order for her to achieve control of diabetes to target.  -She is advised to increase her Humalog 75/25 to 40 units SQ twice daily with breakfast and supper.  She can continue her Metformin 500 mg po twice daily and lower her Glipizide to 5 mg XL daily with breakfast.  I discussed and initiated trial of Mounjaro 2.5 mg SQ weekly x 1 month (sample provided), then increase to 5 mg SQ weekly thereafter if tolerated well.  She can most certainly benefit from GIP therapy due to inadequate response to insulin and oral antidiabetics.  She does  not have personal or family history of thyroid cancer.  She does not smoke or have high triglycerides which might increase her risk of pancreatitis with that medication.  She is an excellent candidate for GIP therapy.  -She is encouraged to consistently monitor glucose 3 times daily, before injecting insulin and before bed, and to call the clinic if she has readings less than 70 or greater than 300 for 3 tests in a row.  She could not tolerate the Dexcom G7 (had allergic reaction to adhesive).  - she is warned not to take insulin without proper monitoring per orders.  2) Blood Pressure /Hypertension: Her blood pressure is controlled to target.  She is advised to continue Norvasc 10 mg po daily, Clonidine 0.3 mg patch once weekly, Clonidine 0.3 mg po daily at bedtime, Lasix 40 mg po daily, Spironolactone 25 mg po daily, and Metoprolol 25 mg po twice daily.  3) Lipids/Hyperlipidemia:  Her most recent lipid panel from 12/29/21 shows uncontrolled LDL at 124 (worsening).  She is advised to continue Lipitor 80 mg po every day at bedtime and Fenofibrate 48 mg po daily.    4)  Weight/Diet:  Her Body mass index is 53.07 kg/m. - clearly complicating her diabetes care.  She is a candidate for modest weight loss.  She has multiple comorbid situations complicating her chance of bariatric surgery.  I discussed with her the fact that loss of 5 - 10% of her  current body weight will have the most impact on her diabetes management.  CDE Consult will be initiated . Exercise, and detailed carbohydrates information provided  -  detailed on discharge instructions.  5) Hypothyroidism:   -There are no recent TFTs to review.   She is advised to continue Levothyroxine 75 mcg po daily before breakfast.  Will recheck TFTs prior to next visit.   - We discussed about the correct intake of her thyroid hormone, on empty stomach at fasting, with water, separated by at least 30 minutes from breakfast and other medications,  and  separated by more than 4 hours from calcium, iron, multivitamins, acid reflux medications (PPIs). -Patient is made aware of the fact that thyroid hormone replacement is needed for life, dose to be adjusted by periodic monitoring of thyroid function tests.  6) Chronic Care/Health Maintenance: -she is not on ACE/ARB (intolerant to both) is  on statin medications and is encouraged to initiate and continue to follow up with Ophthalmology, Dentist,  Podiatrist at least yearly or according to recommendations, and advised to  stay away from smoking. I have recommended yearly flu vaccine and pneumonia vaccine at least every 5 years; moderate intensity exercise for up to 150 minutes weekly; and  sleep for at least 7 hours a day.  - she is advised to maintain close follow up with Angela Helper, MD for primary care needs, as well as her other providers for optimal and coordinated care.      I spent  54  minutes in the care of the patient today including review of labs from [place], Lipids, Thyroid Function, Hematology (current and previous including abstractions from other facilities); face-to-face time discussing  her blood glucose readings/logs, discussing hypoglycemia and hyperglycemia episodes and symptoms, medications doses, her options of short and long term treatment based on the latest standards of care / guidelines;  discussion about incorporating lifestyle medicine;  and documenting the encounter. Risk reduction counseling performed per USPSTF guidelines to reduce obesity and cardiovascular risk factors.     Please refer to Patient Instructions for Blood Glucose Monitoring and Insulin/Medications Dosing Guide"  in media tab for additional information. Please  also refer to " Patient Self Inventory" in the Media  tab for reviewed elements of pertinent patient history.  [place] participated in the discussions, expressed understanding, and voiced agreement with the above plans.  All questions  were answered to her satisfaction. she is encouraged to contact clinic should she have any questions or concerns prior to her return visit.   Follow up plan: - Return in about 3 months (around 09/04/2022) for Diabetes F/U with A1c in office, Thyroid follow up, Previsit labs, Bring meter and logs.    Rayetta Pigg, [facility] [address] [place], [place] [address] Phone: [phone] Fax: [phone]   06/06/2022, 2:34 PM

## 2022-06-08 ENCOUNTER — Other Ambulatory Visit: Payer: Self-pay

## 2022-06-08 DIAGNOSIS — R32 Unspecified urinary incontinence: Secondary | ICD-10-CM

## 2022-06-08 MED ORDER — UNABLE TO FIND: 99 refills | Status: AC

## 2022-06-10 DIAGNOSIS — M5416 Radiculopathy, lumbar region: Secondary | ICD-10-CM | POA: Diagnosis not present

## 2022-06-14 ENCOUNTER — Other Ambulatory Visit: Payer: Self-pay

## 2022-06-20 ENCOUNTER — Ambulatory Visit (INDEPENDENT_AMBULATORY_CARE_PROVIDER_SITE_OTHER): Payer: 59 | Admitting: Family Medicine

## 2022-06-20 ENCOUNTER — Encounter: Payer: Self-pay | Admitting: Family Medicine

## 2022-06-20 VITALS — BP 128/82 | HR 98 | Ht 64.0 in | Wt 309.0 lb

## 2022-06-20 DIAGNOSIS — I10 Essential (primary) hypertension: Secondary | ICD-10-CM | POA: Diagnosis not present

## 2022-06-20 DIAGNOSIS — R52 Pain, unspecified: Secondary | ICD-10-CM

## 2022-06-20 DIAGNOSIS — R5381 Other malaise: Secondary | ICD-10-CM

## 2022-06-20 DIAGNOSIS — E1165 Type 2 diabetes mellitus with hyperglycemia: Secondary | ICD-10-CM

## 2022-06-20 DIAGNOSIS — E782 Mixed hyperlipidemia: Secondary | ICD-10-CM

## 2022-06-20 DIAGNOSIS — M5116 Intervertebral disc disorders with radiculopathy, lumbar region: Secondary | ICD-10-CM

## 2022-06-20 DIAGNOSIS — Z0289 Encounter for other administrative examinations: Secondary | ICD-10-CM

## 2022-06-20 DIAGNOSIS — I5032 Chronic diastolic (congestive) heart failure: Secondary | ICD-10-CM | POA: Diagnosis not present

## 2022-06-20 DIAGNOSIS — F322 Major depressive disorder, single episode, severe without psychotic features: Secondary | ICD-10-CM

## 2022-06-20 NOTE — Patient Instructions (Addendum)
F/U in 13 weeks, call if you need me sooner  Nurse please send rx for clawed cane  Nurse please document oxygen on room air  UDS today  Pain contract today  Medications are prescribed  Need lipid panel, cmp and EGFr in next 1 week please  Careful not to fall   Thanks for choosing [facility], we consider it a privelige to serve you.

## 2022-06-21 LAB — CMP14+EGFR
ALT: 60 IU/L — ABNORMAL HIGH (ref 0–32)
AST: 46 IU/L — ABNORMAL HIGH (ref 0–40)
Albumin/Globulin Ratio: 1.5 (ref 1.2–2.2)
Albumin: 4.8 g/dL (ref 3.9–4.9)
Alkaline Phosphatase: 95 IU/L (ref 44–121)
BUN/Creatinine Ratio: 14 (ref 12–28)
BUN: 16 mg/dL (ref 8–27)
Bilirubin Total: 0.2 mg/dL (ref 0.0–1.2)
CO2: 22 mmol/L (ref 20–29)
Calcium: 10.4 mg/dL — ABNORMAL HIGH (ref 8.7–10.3)
Chloride: 93 mmol/L — ABNORMAL LOW (ref 96–106)
Creatinine, Ser: 1.15 mg/dL — ABNORMAL HIGH (ref 0.57–1.00)
Globulin, Total: 3.1 g/dL (ref 1.5–4.5)
Glucose: 377 mg/dL — ABNORMAL HIGH (ref 70–99)
Potassium: 4.5 mmol/L (ref 3.5–5.2)
Sodium: 137 mmol/L (ref 134–144)
Total Protein: 7.9 g/dL (ref 6.0–8.5)
eGFR: 54 mL/min/1.73 — ABNORMAL LOW

## 2022-06-21 LAB — LIPID PANEL
Chol/HDL Ratio: 5.8 ratio — ABNORMAL HIGH (ref 0.0–4.4)
Cholesterol, Total: 196 mg/dL (ref 100–199)
HDL: 34 mg/dL — ABNORMAL LOW (ref >39)
LDL Chol Calc (NIH): 101 mg/dL — ABNORMAL HIGH (ref 0–99)
Triglycerides: 358 mg/dL — ABNORMAL HIGH (ref 0–149)
VLDL Cholesterol Cal: 61 mg/dL — ABNORMAL HIGH (ref 5–40)

## 2022-06-21 MED ORDER — FENOFIBRATE 145 MG PO TABS: 145.0000 mg | ORAL_TABLET | Freq: Every day | ORAL | 5 refills | Status: DC

## 2022-06-21 NOTE — Progress Notes (Signed)
Angela [name]     MRN: [id number]      DOB: 29-Sep-1960   HPI Angela [name] is here for follow up and re-evaluation of chronic medical conditions, medication management and review of any available recent lab and radiology data.  Preventive health is updated, specifically  Cancer screening and Immunization.   Questions or concerns regarding consultations or procedures which the PT has had in the interim are  addressed. The PT denies any adverse reactions to current medications since the last visit.  Requests clawed cane fo assistance with safe ambulation Blood sugar is reportedly improving , but still ucontrolled   ROS Denies recent fever or chills. Denies sinus pressure, nasal congestion, ear pain or sore throat. Denies chest congestion, productive cough or wheezing. Denies chest pains, palpitations and leg swelling Denies abdominal pain, nausea, vomiting,diarrhea or constipation.   Denies dysuria, frequency, hesitancy c/o incontinence. Chronic  joint pain, swelling and limitation in mobility. Denies headaches, seizures, c/o lower ext numbness, and  tingling. Denies uncontrolled depression, anxiety or insomnia. Denies skin break down or rash.   PE  BP 128/82   Pulse 98   Ht '5\' 4"'$  (S99990927 m)   Wt (!) 309 lb (140.2 kg)   SpO2 95%   BMI 53.04 kg/m   Patient alert and oriented and in no cardiopulmonary distress.  HEENT: No facial asymmetry, EOMI,     Neck supple .  Chest: Clear to auscultation bilaterally.  CVS: S1, S2 no murmurs, no S3.Regular rate.  ABD: Soft non tender.   Ext: No edema  MS: Decreased  ROM spine, shoulders, hips and knees.  Skin: Intact, no ulcerations or rash noted.  Psych: Good eye contact, normal affect. Memory intact not anxious or depressed appearing.  CNS: CN 2-12 intact, power,  normal throughout.no focal deficits noted.   Assessment & Plan  Essential hypertension, benign Controlled, no change in medication DASH diet and commitment to daily  physical activity for a minimum of 30 minutes discussed and encouraged, as a part of hypertension management. The importance of attaining a healthy weight is also discussed.     06/20/2022   11:22 AM 06/20/2022   10:56 AM 06/20/2022   10:55 AM 06/06/2022    1:57 PM 03/07/2022   10:12 AM 03/06/2022    1:56 PM 02/28/2022   10:42 AM  BP/Weight  Systolic BP 0000000 0000000 123456 Q000111Q [id number] [id number] A999333  Diastolic BP 82 96 89 78 79 81 72  Wt. (Lbs)   309 309.2 307 306.6 307.6  BMI   53.04 kg/m2 53.07 kg/m2 52.7 kg/m2 52.63 kg/m2 52.8 kg/m2       Chronic diastolic heart failure (HCC) Not decompensated, stable and currently controled   Uncontrolled type 2 diabetes mellitus with hyperglycemia ([place]) Angela [name] is reminded of the importance of commitment to daily physical activity for 30 minutes or more, as able and the need to limit carbohydrate intake to 30 to 60 grams per meal to help with blood sugar control.   The need to take medication as prescribed, test blood sugar as directed, and to call between visits if there is a concern that blood sugar is uncontrolled is also discussed.   Angela [name] is reminded of the importance of daily foot exam, annual eye examination, and good blood sugar, blood pressure and cholesterol control. Uncontrolled, managed by Endo    Latest Ref Rng & Units 06/20/2022   11:56 AM 06/06/2022    2:18 PM 03/07/2022   11:12 AM 02/08/2022  8:21 AM 12/29/2021   11:30 AM  Diabetic Labs  HbA1c 4.0 - 5.6 %  10.4    10.2   Micro/Creat Ratio 0 - 29 mg/g creat   16     Chol 100 - 199 mg/dL 196     195   HDL >39 mg/dL 34     39   Calc LDL 0 - 99 mg/dL 101     124   Triglycerides 0 - 149 mg/dL 358     182   Creatinine 0.57 - 1.00 mg/dL 1.15    0.87        06/20/2022   11:22 AM 06/20/2022   10:56 AM 06/20/2022   10:55 AM 06/06/2022    1:57 PM 03/07/2022   10:12 AM 03/06/2022    1:56 PM 02/28/2022   10:42 AM  BP/Weight  Systolic BP 0000000 0000000 123456 Q000111Q XX123456 A999333 A999333  Diastolic BP 82 96 89 78 79 81  72  Wt. (Lbs)   309 309.2 307 306.6 307.6  BMI   53.04 kg/m2 53.07 kg/m2 52.7 kg/m2 52.63 kg/m2 52.8 kg/m2      Latest Ref Rng & Units 05/10/2022   12:00 AM 01/19/2021   12:00 AM  Foot/eye exam completion dates  Eye Exam No Retinopathy No Retinopathy  No Retinopathy         This result is from an external source.        Lumbar disc disease with radiculopathy Chronic debility , with limitation in safe mobility without assistive device , clawed cane prescribed  Depression, [place], single episode, severe (HCC) Score of 9 in 06/2022, inadequately treated , does have therapy  Encounter for pain management The patient's Controlled Substance registry is reviewed and compliance confirmed. Adequacy of  Pain control and level of function is assessed. Medication dosing is adjusted as deemed appropriate. Twelve weeks of medication is prescribed , with a follow up appointment between 11 to 12 weeks .   Pain management contract signed Contract reviewed and signed at  visit UDS sent  Morbid obesity [facility])  Patient re-educated about  the importance of commitment to a  minimum of 150 minutes of exercise per week as able.  The importance of healthy food choices with portion control discussed, as well as eating regularly and within a 12 hour window most days. The need to choose "clean , green" food 50 to 75% of the time is discussed, as well as to make water the primary drink and set a goal of 64 ounces water daily.       06/20/2022   10:55 AM 06/06/2022    1:57 PM 03/07/2022   10:12 AM  Weight /BMI  Weight 309 lb 309 lb 3.2 oz 307 lb  Height '5\' 4"'$  (1.626 m) '5\' 4"'$  (1.626 m) '5\' 4"'$  (1.626 m)  BMI 53.04 kg/m2 53.07 kg/m2 52.7 kg/m2

## 2022-06-23 LAB — TOXASSURE SELECT 13 (MW), URINE

## 2022-06-26 ENCOUNTER — Encounter: Payer: Self-pay | Admitting: Family Medicine

## 2022-06-26 DIAGNOSIS — Z0289 Encounter for other administrative examinations: Secondary | ICD-10-CM | POA: Insufficient documentation

## 2022-06-26 MED ORDER — DIAZEPAM 5 MG PO TABS: ORAL_TABLET | ORAL | 5 refills | Status: DC

## 2022-06-26 MED ORDER — OXYCODONE HCL ER 40 MG PO T12A: 40.0000 mg | EXTENDED_RELEASE_TABLET | Freq: Two times a day (BID) | ORAL | 0 refills | Status: DC

## 2022-06-26 MED ORDER — GABAPENTIN 800 MG PO TABS: ORAL_TABLET | ORAL | 5 refills | Status: DC

## 2022-06-26 NOTE — Assessment & Plan Note (Signed)
Score of 9 in 06/2022, inadequately treated , does have therapy

## 2022-06-26 NOTE — Assessment & Plan Note (Signed)
The patient's Controlled Substance registry is reviewed and compliance confirmed. Adequacy of  Pain control and level of function is assessed. Medication dosing is adjusted as deemed appropriate. Twelve weeks of medication is prescribed , with a follow up appointment between 11 to 12 weeks .  

## 2022-06-26 NOTE — Assessment & Plan Note (Signed)
Contract reviewed and signed at  visit UDS sent

## 2022-06-26 NOTE — Assessment & Plan Note (Signed)
Not decompensated, stable and currently controled

## 2022-06-26 NOTE — Assessment & Plan Note (Signed)
Controlled, no change in medication DASH diet and commitment to daily physical activity for a minimum of 30 minutes discussed and encouraged, as a part of hypertension management. The importance of attaining a healthy weight is also discussed.     06/20/2022   11:22 AM 06/20/2022   10:56 AM 06/20/2022   10:55 AM 06/06/2022    1:57 PM 03/07/2022   10:12 AM 03/06/2022    1:56 PM 02/28/2022   10:42 AM  BP/Weight  Systolic BP 0000000 0000000 123456 Q000111Q XX123456 A999333 A999333  Diastolic BP 82 96 89 78 79 81 72  Wt. (Lbs)   309 309.2 307 306.6 307.6  BMI   53.04 kg/m2 53.07 kg/m2 52.7 kg/m2 52.63 kg/m2 52.8 kg/m2

## 2022-06-26 NOTE — Assessment & Plan Note (Signed)
Chronic debility , with limitation in safe mobility without assistive device , clawed cane prescribed

## 2022-06-26 NOTE — Assessment & Plan Note (Signed)
  Patient re-educated about  the importance of commitment to a  minimum of 150 minutes of exercise per week as able.  The importance of healthy food choices with portion control discussed, as well as eating regularly and within a 12 hour window most days. The need to choose "clean , green" food 50 to 75% of the time is discussed, as well as to make water the primary drink and set a goal of 64 ounces water daily.       06/20/2022   10:55 AM 06/06/2022    1:57 PM 03/07/2022   10:12 AM  Weight /BMI  Weight 309 lb 309 lb 3.2 oz 307 lb  Height 5' 4"$  (1.626 m) 5' 4"$  (1.626 m) 5' 4"$  (1.626 m)  BMI 53.04 kg/m2 53.07 kg/m2 52.7 kg/m2

## 2022-06-26 NOTE — Assessment & Plan Note (Signed)
Angela [name] is reminded of the importance of commitment to daily physical activity for 30 minutes or more, as able and the need to limit carbohydrate intake to 30 to 60 grams per meal to help with blood sugar control.   The need to take medication as prescribed, test blood sugar as directed, and to call between visits if there is a concern that blood sugar is uncontrolled is also discussed.   Angela [name] is reminded of the importance of daily foot exam, annual eye examination, and good blood sugar, blood pressure and cholesterol control. Uncontrolled, managed by Endo    Latest Ref Rng & Units 06/20/2022   11:56 AM 06/06/2022    2:18 PM 03/07/2022   11:12 AM 02/08/2022    8:21 AM 12/29/2021   11:30 AM  Diabetic Labs  HbA1c 4.0 - 5.6 %  10.4    10.2   Micro/Creat Ratio 0 - 29 mg/g creat   16     Chol 100 - 199 mg/dL 196     195   HDL >39 mg/dL 34     39   Calc LDL 0 - 99 mg/dL 101     124   Triglycerides 0 - 149 mg/dL 358     182   Creatinine 0.57 - 1.00 mg/dL 1.15    0.87        06/20/2022   11:22 AM 06/20/2022   10:56 AM 06/20/2022   10:55 AM 06/06/2022    1:57 PM 03/07/2022   10:12 AM 03/06/2022    1:56 PM 02/28/2022   10:42 AM  BP/Weight  Systolic BP 0000000 0000000 123456 Q000111Q XX123456 A999333 A999333  Diastolic BP 82 96 89 78 79 81 72  Wt. (Lbs)   309 309.2 307 306.6 307.6  BMI   53.04 kg/m2 53.07 kg/m2 52.7 kg/m2 52.63 kg/m2 52.8 kg/m2      Latest Ref Rng & Units 05/10/2022   12:00 AM 01/19/2021   12:00 AM  Foot/eye exam completion dates  Eye Exam No Retinopathy No Retinopathy  No Retinopathy         This result is from an external source.

## 2022-06-29 ENCOUNTER — Encounter: Payer: Self-pay | Admitting: Radiology

## 2022-07-05 DIAGNOSIS — M5416 Radiculopathy, lumbar region: Secondary | ICD-10-CM | POA: Diagnosis not present

## 2022-07-05 DIAGNOSIS — R0609 Other forms of dyspnea: Secondary | ICD-10-CM | POA: Diagnosis not present

## 2022-07-11 ENCOUNTER — Encounter: Payer: Self-pay | Admitting: Podiatry

## 2022-07-11 ENCOUNTER — Ambulatory Visit (INDEPENDENT_AMBULATORY_CARE_PROVIDER_SITE_OTHER): Payer: 59 | Admitting: Podiatry

## 2022-07-11 DIAGNOSIS — B351 Tinea unguium: Secondary | ICD-10-CM

## 2022-07-11 DIAGNOSIS — E1142 Type 2 diabetes mellitus with diabetic polyneuropathy: Secondary | ICD-10-CM | POA: Diagnosis not present

## 2022-07-11 DIAGNOSIS — M79675 Pain in left toe(s): Secondary | ICD-10-CM

## 2022-07-11 DIAGNOSIS — Q828 Other specified congenital malformations of skin: Secondary | ICD-10-CM

## 2022-07-11 DIAGNOSIS — L84 Corns and callosities: Secondary | ICD-10-CM

## 2022-07-11 DIAGNOSIS — M79674 Pain in right toe(s): Secondary | ICD-10-CM | POA: Diagnosis not present

## 2022-07-11 NOTE — Progress Notes (Signed)
  Subjective:  [name], female    DOB: [birth year],  MRN: [id number]  Angela [name] presents to clinic today for at risk foot care with history of diabetic neuropathy and painful porokeratotic lesion(s) right heel and painful mycotic toenails that limit ambulation. Painful toenails interfere with ambulation. Aggravating factors include wearing enclosed shoe gear. Pain is relieved with periodic professional debridement. Painful porokeratotic lesions are aggravated when weightbearing with and without shoegear. Pain is relieved with periodic professional debridement.  Chief Complaint  Patient presents with   Nail Problem    [name] PCP-[facility] PCP VST-06/20/2022 A1C-10.1 BS-157  Patient states she has started taking Mounjaro and is doing well. She also states she attempted to file down her lesion on her right heel.  New problem(s): None.   PCP is Angela Helper, MD.  Allergies  Allergen Reactions   Latex Itching and Rash   Morphine Other (See Comments)    Reaction with esophagus, unable to swallow.    Morphine And Related Shortness Of Breath and Swelling   Other Itching, Shortness Of Breath and Swelling   Ace Inhibitors Cough   Aspirin Other (See Comments)    Reaction with esophagus, unable to swallow.    Losartan Cough   Tapentadol Other (See Comments)    Nausea, increased sleepiness, h/a   Adhesive [Tape] Rash   Tomato Other (See Comments)    Acid reflux due to acid in tomato    Review of Systems: Negative except as noted in the HPI.  Objective:  There were no vitals filed for this visit.  Power is a pleasant 62 y.o. female morbidly obese in NAD. AAO x 3.  Neurovascular Examination: CFT immediate b/l LE. Palpable DP/PT pulses b/l LE. Digital hair sparse b/l. Skin temperature gradient WNL b/l. No pain with calf compression b/l. No edema noted b/l. No cyanosis or clubbing noted b/l LE.  Pt has subjective symptoms of neuropathy. Protective  sensation decreased with 10 gram monofilament b/l.  Dermatological:  Pedal integument with normal turgor, texture and tone b/l LE. No open wounds b/l. No interdigital macerations b/l. Toenails 1-5 b/l elongated, thickened, discolored with subungual debris. +Tenderness with dorsal palpation of nailplates.   Porokeratotic lesion(s) noted plantar aspect of right heel and hyperkeratotic lesion submet head 5 left foot. No erythema, no edema, no drainage, no fluctuance.  Musculoskeletal:  Muscle strength 5/5 to all lower extremity muscle groups bilaterally. HAV with bunion deformity noted b/l LE. Utilizes cane for ambulation assistance.  Assessment/Plan: 1. Pain due to onychomycosis of toenails of both feet   2. Porokeratosis   3. Callus   4. Diabetic peripheral neuropathy associated with type 2 diabetes mellitus ([place])    -Patient's family member present. All questions/concerns addressed on today's visit. -Medicaid ABN on file for paring of corn(s)/callus(es)/porokeratos(es). Copy in patient chart. -Patient to continue soft, supportive shoe gear daily. -Toenails 1-5 b/l were debrided in length and girth with sterile nail nippers and dremel without iatrogenic bleeding.  -Callus(es) submet head 5 left foot pared utilizing rotary bur without complication or incident. Total number pared =1. -Porokeratotic lesion(s) right heel pared and enucleated with sterile currette without incident. Total number of lesions debrided=1. -Patient/POA to call should there be question/concern in the interim.   Return in about 3 months (around 10/11/2022).  Marzetta Board, DPM

## 2022-07-21 ENCOUNTER — Other Ambulatory Visit: Payer: Self-pay

## 2022-07-21 DIAGNOSIS — R5381 Other malaise: Secondary | ICD-10-CM

## 2022-07-21 MED ORDER — UNABLE TO FIND: 0 refills | Status: AC

## 2022-07-31 ENCOUNTER — Other Ambulatory Visit: Payer: Self-pay | Admitting: Family Medicine

## 2022-08-02 ENCOUNTER — Other Ambulatory Visit: Payer: Self-pay | Admitting: "Endocrinology

## 2022-08-04 ENCOUNTER — Other Ambulatory Visit: Payer: Self-pay | Admitting: Family Medicine

## 2022-08-04 DIAGNOSIS — I10 Essential (primary) hypertension: Secondary | ICD-10-CM

## 2022-08-05 DIAGNOSIS — R0609 Other forms of dyspnea: Secondary | ICD-10-CM | POA: Diagnosis not present

## 2022-08-11 DIAGNOSIS — M5416 Radiculopathy, lumbar region: Secondary | ICD-10-CM | POA: Diagnosis not present

## 2022-08-23 ENCOUNTER — Other Ambulatory Visit: Payer: Self-pay | Admitting: Pulmonary Disease

## 2022-08-28 NOTE — Telephone Encounter (Signed)
Pt calling back to get her prescription sent

## 2022-08-29 ENCOUNTER — Telehealth: Payer: Self-pay | Admitting: Pulmonary Disease

## 2022-08-29 NOTE — Telephone Encounter (Signed)
Prescription Request  08/29/2022  LOV: 02/28/2022  What is the name of the medication or equipment? furosemide (LASIX) 40 MG tablet [[id number]]    Have you contacted your pharmacy to request a refill? Yes   Which pharmacy would you like this sent to?  [id number] - [place], [place] - [address] AT [facility] [address] [place] [address] Phone: [phone] Fax: [phone]  [facility] - [place], [place] - [place] [address] [place] [address] Phone: [phone] Fax: [phone]  [id number] - [place], [place] - [address] AT [address] [place] [address] Phone: [phone] Fax: [phone]    Patient notified that their request is being sent to the clinical staff for review and that they should receive a response within 2 business days.   Please advise at Mobile [phone] (mobile)

## 2022-08-30 NOTE — Telephone Encounter (Signed)
Patient is calling again to get [facility].  Please contact patient once this has been approved.  Patient stated she has been without her medication for two days.  Please advise.  CB# [phone]

## 2022-08-30 NOTE — Telephone Encounter (Signed)
ATC X1 LVM for patient to call the office back. Please confirm which medication she needs refilled. If its the lasix she needs to call cardiologist

## 2022-08-31 ENCOUNTER — Telehealth: Payer: Self-pay | Admitting: Pulmonary Disease

## 2022-08-31 DIAGNOSIS — E039 Hypothyroidism, unspecified: Secondary | ICD-10-CM | POA: Diagnosis not present

## 2022-08-31 DIAGNOSIS — E1159 Type 2 diabetes mellitus with other circulatory complications: Secondary | ICD-10-CM | POA: Diagnosis not present

## 2022-08-31 MED ORDER — FUROSEMIDE 40 MG PO TABS: 40.0000 mg | ORAL_TABLET | Freq: Every day | ORAL | 0 refills | Status: DC

## 2022-08-31 NOTE — Telephone Encounter (Signed)
[phone]  is her #. She is waiting at Pharmacy. States call the Pharm, not her.

## 2022-08-31 NOTE — Telephone Encounter (Addendum)
Per TC from 04/14/2022, Dr Vassie Loll stated: Molli Knock to refill x 1.  Further refills per cardiology  CC to her cardiologist   Will deny refill on Lasix 40 mg.  This was refilled x 1 today, 08/31/2022 per Verlon Au.

## 2022-08-31 NOTE — Telephone Encounter (Signed)
Please see last signed encounter. PT is out of this med and has called a few times now (4). Please call PT to advise what is holding up the script.  This is her heart and lungs med and very impt, she says.

## 2022-08-31 NOTE — Telephone Encounter (Signed)
Called and spoke w/ pt to let her know her refill request was sent in.

## 2022-08-31 NOTE — Telephone Encounter (Signed)
Rx for lasix was refilled  I called and [facility] and [facility] have [facility]  Nothing further needed

## 2022-09-01 LAB — COMPREHENSIVE METABOLIC PANEL
ALT: 34 IU/L — ABNORMAL HIGH (ref 0–32)
AST: 30 IU/L (ref 0–40)
Albumin/Globulin Ratio: 1.3 (ref 1.2–2.2)
Albumin: 4.4 g/dL (ref 3.9–4.9)
Alkaline Phosphatase: 57 IU/L (ref 44–121)
BUN/Creatinine Ratio: 12 (ref 12–28)
BUN: 13 mg/dL (ref 8–27)
Bilirubin Total: 0.2 mg/dL (ref 0.0–1.2)
CO2: 21 mmol/L (ref 20–29)
Calcium: 10.3 mg/dL (ref 8.7–10.3)
Chloride: 102 mmol/L (ref 96–106)
Creatinine, Ser: 1.07 mg/dL — ABNORMAL HIGH (ref 0.57–1.00)
Globulin, Total: 3.3 g/dL (ref 1.5–4.5)
Glucose: 118 mg/dL — ABNORMAL HIGH (ref 70–99)
Potassium: 4.9 mmol/L (ref 3.5–5.2)
Sodium: 141 mmol/L (ref 134–144)
Total Protein: 7.7 g/dL (ref 6.0–8.5)
eGFR: 59 mL/min/{1.73_m2} — ABNORMAL LOW (ref >59)

## 2022-09-01 LAB — T4, FREE: Free T4: 1.65 ng/dL (ref 0.82–1.77)

## 2022-09-01 LAB — TSH: TSH: 1.43 u[IU]/mL (ref 0.450–4.500)

## 2022-09-04 ENCOUNTER — Ambulatory Visit (INDEPENDENT_AMBULATORY_CARE_PROVIDER_SITE_OTHER): Payer: 59 | Admitting: Nurse Practitioner

## 2022-09-04 ENCOUNTER — Other Ambulatory Visit: Payer: Self-pay | Admitting: Family Medicine

## 2022-09-04 ENCOUNTER — Encounter: Payer: Self-pay | Admitting: Nurse Practitioner

## 2022-09-04 VITALS — BP 118/84 | HR 83 | Ht 64.0 in | Wt 291.7 lb

## 2022-09-04 DIAGNOSIS — E039 Hypothyroidism, unspecified: Secondary | ICD-10-CM | POA: Diagnosis not present

## 2022-09-04 DIAGNOSIS — R0609 Other forms of dyspnea: Secondary | ICD-10-CM | POA: Diagnosis not present

## 2022-09-04 DIAGNOSIS — E559 Vitamin D deficiency, unspecified: Secondary | ICD-10-CM | POA: Diagnosis not present

## 2022-09-04 DIAGNOSIS — E1159 Type 2 diabetes mellitus with other circulatory complications: Secondary | ICD-10-CM | POA: Diagnosis not present

## 2022-09-04 DIAGNOSIS — I10 Essential (primary) hypertension: Secondary | ICD-10-CM | POA: Diagnosis not present

## 2022-09-04 DIAGNOSIS — E782 Mixed hyperlipidemia: Secondary | ICD-10-CM | POA: Diagnosis not present

## 2022-09-04 DIAGNOSIS — Z794 Long term (current) use of insulin: Secondary | ICD-10-CM | POA: Diagnosis not present

## 2022-09-04 LAB — POCT GLYCOSYLATED HEMOGLOBIN (HGB A1C): Hemoglobin A1C: 7.8 % — AB (ref 4.0–5.6)

## 2022-09-04 MED ORDER — INSULIN LISPRO PROT & LISPRO (75-25 MIX) 100 UNIT/ML KWIKPEN: 40.0000 [IU] | PEN_INJECTOR | Freq: Two times a day (BID) | SUBCUTANEOUS | 3 refills | Status: DC

## 2022-09-04 MED ORDER — TIRZEPATIDE 5 MG/0.5ML ~~LOC~~ SOAJ: 5.0000 mg | SUBCUTANEOUS | 1 refills | Status: DC

## 2022-09-04 NOTE — Progress Notes (Signed)
09/04/2022, 12:40 PM                    Endocrinology follow-up note   Subjective:    [name], female    DOB: 1960/08/09.  Angela Colmenares is being seen in follow-up in the management of currently uncontrolled symptomatic type 2 diabetes.  She also has comorbid dyslipidemia, hypertension.   PMD:  Kerri Perches, MD.   Past Medical History:  Diagnosis Date   Allergy    Phreesia 06/02/2020   Anxiety    Arthritis    Asthma    Asthma    Phreesia 04/12/2020   Breast mass    left (monitor)   CHF (congestive heart failure) (HCC)    Phreesia 04/12/2020   Coronary atherosclerosis of native coronary artery    Mild nonobstructive CAD by cardiac catheterization 2008 - Dr. Sharyn Lull   Degenerative disc disease    Depression    Depression    Phreesia 04/12/2020   Diabetes mellitus without complication (HCC)    Phreesia 04/12/2020   Dysrhythmia    Essential hypertension, benign    Folliculitis 01/21/2015   GERD (gastroesophageal reflux disease)    H/O hiatal hernia    Helicobacter pylori gastritis 2008   Hypercholesterolemia    Hypertension    Hyperthyroidism    s/p radiation   Left-sided epistaxis 05/15/2018   Low back pain    Migraine    Nephrolithiasis    Recurring episodes since 2004   Neuromuscular disorder (HCC)    Phreesia 04/12/2020   Severe obesity (BMI >= 40) (HCC)    Sleep apnea    STOP BANG SCORE 6no cpap used   Thyroid disease    Phreesia 04/12/2020   Type 2 diabetes mellitus with diabetic neuropathy (HCC)     Past Surgical History:  Procedure Laterality Date   ABDOMINAL HYSTERECTOMY     BACK SURGERY  1993   BACK SURGERY  1994   BACK SURGERY  2002   BACK SURGERY  2011   [facility]    CARPAL TUNNEL RELEASE Left    CHOLECYSTECTOMY  1996   COLONOSCOPY  2000 [facility]   INT HEMORRHOIDS/FISSURE   COLONOSCOPY  2003 [facility], CHANGE IN BOWEL HABITS   INT HEMORRHOIDS   COLONOSCOPY  2006 [facility]   INT HEMORRHOIDS    COLONOSCOPY  2007 [facility]   INT HEMORRHOIDS   CYSTOSCOPY WITH STENT PLACEMENT Left 07/25/2012   Procedure: CYSTOSCOPY, left retograde pyelogram WITH left ureteral  STENT PLACEMENT;  Surgeon: Garnett Farm, MD;  Location: [facility];  Service: Urology;  Laterality: Left;   CYSTOSCOPY/RETROGRADE/URETEROSCOPY  12/04/2011   Procedure: CYSTOSCOPY/RETROGRADE/URETEROSCOPY;  Surgeon: Milford Cage, MD;  Location: [facility];  Service: Urology;  Laterality: Right;   CYSTOSCOPY/RETROGRADE/URETEROSCOPY/STONE EXTRACTION WITH BASKET Left 09/03/2012   Procedure: CYSTOSCOPY/RETROGRADE pyelogram/digital URETEROSCOPY/STONE EXTRACTION WITH BASKET, left stent removal;  Surgeon: Milford Cage, MD;  Location: [facility];  Service: Urology;  Laterality: Left;   ESOPHAGOGASTRODUODENOSCOPY  2008   [id number] hiatal hernia./Normal esophagus without evidence of Barrett's mass, stricture, erosion or ulceration./Normal duodenal bulb and second portion of the duodenum./Diffuse erythema in the body and the antrum with occasional erosion.  Biopsies obtained via cold forceps to evaluate for H. pylori gastritis   FRACTURE SURGERY  1999   right clavicle   HOLMIUM LASER APPLICATION Left 09/03/2012   Procedure: HOLMIUM LASER APPLICATION;  Surgeon: Milford Cage, MD;  Location: [facility];  Service: Urology;  Laterality: Left;   KNEE ARTHROSCOPY  10/04/2010,    right knee arthroscopy, dr Theda Sers   LEFT HEART CATH AND CORONARY ANGIOGRAPHY N/A 05/08/2018   Procedure: LEFT HEART CATH AND CORONARY ANGIOGRAPHY;  Surgeon: Troy Sine, MD;  Location: [place] [facility];  Service: Cardiovascular;  Laterality: N/A;   left knee arthroscopic surgery  1999   Left salphingectomy secondary to ectopic pregnancy  [place] CATH N/A 07/07/2019   Procedure: RIGHT HEART CATH;  Surgeon: Leonie Man, MD;  Location: [place] [facility];  Service: Cardiovascular;  Laterality: N/A;   rotary cuff  Right  05/14/2014   [facility]   SPINE SURGERY N/A    Phreesia 06/02/2020   UPPER GASTROINTESTINAL ENDOSCOPY  2008 abd pain   H. pylori gastritis   UPPER GASTROINTESTINAL ENDOSCOPY  [place] [facility] [place]   [facility]    Social History   Socioeconomic History   Marital status: Single    Spouse name: Not on file   Number of children: 1   Years of education: 12   Highest education level: 12th grade  Occupational History   Occupation: Disable   Tobacco Use   Smoking status: Never   Smokeless tobacco: Never  Vaping Use   Vaping Use: Never used  Substance and Sexual Activity   Alcohol use: No   Drug use: No   Sexual activity: Not Currently    Partners: Male    Birth control/protection: Surgical    Comment: hyst  Other Topics Concern   Not on file  Social History Narrative   Lives alone    Social Determinants of Health   Financial Resource Strain: Low Risk  (04/18/2021)   Overall Financial Resource Strain (CARDIA)    Difficulty of Paying Living Expenses: Not very hard  Food Insecurity: No Food Insecurity (62/19/2022)   Hunger Vital Sign    Worried About Running Out of Food in the Last Year: Never true    Ran Out of Food in the Last Year: Never true  Transportation Needs: No Transportation Needs (04/18/2021)   PRAPARE - Hydrologist (Medical): No    Lack of Transportation (Non-Medical): No  Physical Activity: Sufficiently Active (04/18/2021)   Exercise Vital Sign    Days of Exercise per Week: 5 days    Minutes of Exercise per Session: 30 min  Stress: No Stress Concern Present (04/15/2020)   [place]    Feeling of Stress : Not at all  Social Connections: Socially Isolated (04/18/2021)   Social Connection and Isolation Panel [NHANES]    Frequency of Communication with Friends and Family: More than three times a week    Frequency of Social Gatherings with Friends and Family: More than three times  a week    Attends Religious Services: Never    Marine scientist or Organizations: No    Attends Archivist Meetings: Never    Marital Status: Divorced    Family History  Problem Relation Age of Onset   Heart disease Mother    Hypertension Mother    Cancer Mother        Cervical    Asthma Mother    Heart attack Father    Cancer Father        Prostate   Colon cancer Father        DECEASED AGE 12   Colon polyps Neg  Hx     Outpatient Encounter Medications as of 09/04/2022  Medication Sig   albuterol (VENTOLIN HFA) 108 (90 Base) MCG/ACT inhaler Inhale 2 puffs into the lungs every 6 (six) hours as needed for wheezing or shortness of breath.   amLODipine (NORVASC) 10 MG tablet TAKE 1 TABLET BY MOUTH EVERY DAY   atorvastatin (LIPITOR) 80 MG tablet    B-D UF III MINI PEN NEEDLES 31G X 5 MM MISC Use to inject insulin twice daily   betamethasone, augmented, (DIPROLENE) 0.05 % lotion Apply 1 application. topically 2 (two) times daily.   blood glucose meter kit and supplies Dispense based on patient and insurance preference. Once daily testing dx. e11.9   calcipotriene (DOVONOX) 0.005 % ointment Apply 1 application topically daily.    clobetasol cream (TEMOVATE) 0.05 % Apply 1 application. topically 2 (two) times daily.   cloNIDine (CATAPRES - DOSED IN MG/24 HR) 0.3 mg/24hr patch APPLY 1 PATCH TO SKIN ONCE A WEEK   cloNIDine (CATAPRES) 0.3 MG tablet TAKE 1 TABLET (0.3 MG TOTAL) BY MOUTH AT BEDTIME.   cyclobenzaprine (FLEXERIL) 10 MG tablet TAKE 1 TABLET BY MOUTH THREE TIMES A DAY   diazepam (VALIUM) 5 MG tablet Take one tablet by mouth three times daily for back spasm   DULoxetine (CYMBALTA) 60 MG capsule TAKE 1 CAPSULE BY MOUTH TWICE A DAY   ergocalciferol (VITAMIN D2) 1.25 MG (50000 UT) capsule Take 1 capsule (50,000 Units total) by mouth once a week. One capsule once weekly   esomeprazole (NEXIUM) 40 MG capsule TAKE 1 CAPSULE BY MOUTH EVERY DAY   fenofibrate (TRICOR) 145 MG  tablet Take 1 tablet (145 mg total) by mouth daily.   furosemide (LASIX) 40 MG tablet Take 1 tablet (40 mg total) by mouth daily.   gabapentin (NEURONTIN) 800 MG tablet Take one tablet by mouth four times daily for pain   glucose blood (ONETOUCH VERIO) test strip USE AS INSTRUCTED FOUR TIMES DAILY TESTING DX E11.65   hydrocortisone (PROCTOSOL HC) 2.5 % rectal cream PLACE 1 APPLICATION RECTALLY 4 (FOUR) TIMES DAILY. FOR 12 DAYS   ipratropium-albuterol (DUONEB) 0.5-2.5 (3) MG/3ML SOLN Take 3 mLs by nebulization every 6 (six) hours as needed.   KLOR-CON M20 20 MEQ tablet TAKE 1 TABLET BY MOUTH TWICE A DAY   Lancets (ONETOUCH DELICA PLUS LANCET33G) MISC USE TO TEST ONCE DAILY   levothyroxine (SYNTHROID) 75 MCG tablet Take 1 tablet (75 mcg total) by mouth daily.   loratadine (CLARITIN) 10 MG tablet TAKE 1 TABLET BY MOUTH EVERY DAY   metFORMIN (GLUCOPHAGE) 500 MG tablet Take 1 tablet (500 mg total) by mouth 2 (two) times daily with a meal.   metoprolol tartrate (LOPRESSOR) 25 MG tablet Take 1 tablet (25 mg total) by mouth 2 (two) times daily.   naloxone (NARCAN) nasal spray 4 mg/0.1 mL One spray into each nostril once as needed   nitroGLYCERIN (NITROSTAT) 0.4 MG SL tablet Place 1 tablet (0.4 mg total) under the tongue every 5 (five) minutes as needed for chest pain.   oxyCODONE (OXYCONTIN) 40 mg 12 hr tablet Take 1 tablet (40 mg total) by mouth every 12 (twelve) hours.   oxyCODONE (OXYCONTIN) 40 mg 12 hr tablet Take 1 tablet (40 mg total) by mouth every 12 (twelve) hours.   [START ON 06/07/2023] oxyCODONE (OXYCONTIN) 40 mg 12 hr tablet Take 1 tablet (40 mg total) by mouth every 12 (twelve) hours.   oxyCODONE (OXYCONTIN) 40 mg 12 hr tablet Take 1 tablet (40  mg total) by mouth every 12 (twelve) hours.   oxyCODONE (OXYCONTIN) 40 mg 12 hr tablet Take 1 tablet (40 mg total) by mouth every 12 (twelve) hours.   oxyCODONE (OXYCONTIN) 40 mg 12 hr tablet Take 1 tablet (40 mg total) by mouth every 12 (twelve) hours.    spironolactone (ALDACTONE) 25 MG tablet TAKE 1 TABLET (25 MG TOTAL) BY MOUTH DAILY.   SYMBICORT 160-4.5 MCG/ACT inhaler Inhale 2 puffs into the lungs in the morning and at bedtime.   UNABLE TO FIND BD Ultra-Fine Mini Pen Needle 31 gauge x 3/16"  USE AS DIRECTED TWICE A DAY   UNABLE TO FIND OneTouch Verio test strips  USE AS INSTRUCTED FOUR TIMES DAILY TESTING DX E11.65   UNABLE TO FIND OneTouch Delica Plus Lancet 33 gauge  USE TO TEST ONCE DAILY   UNABLE TO FIND Med Name:  Nutritional supplement 225 units/m or PRN Incontinence pads   264/m or PRN Underpads/Chux     90/m or PRN Nonsterile gloves  1B/M OR PRN   UNABLE TO FIND Med Name: Step stool with handle   zolpidem (AMBIEN) 10 MG tablet Take 1 tablet (10 mg total) by mouth at bedtime.   [DISCONTINUED] glipiZIDE (GLUCOTROL XL) 5 MG 24 hr tablet Take 1 tablet (5 mg total) by mouth daily with breakfast.   [DISCONTINUED] Insulin Lispro Prot & Lispro (HUMALOG 75/25 MIX) (75-25) 100 UNIT/ML Kwikpen Inject 40 Units into the skin in the morning and at bedtime.   [DISCONTINUED] tirzepatide [facility]) 5 MG/0.5ML Pen Inject 5 mg into the skin once a week.   Insulin Lispro Prot & Lispro (HUMALOG 75/25 MIX) (75-25) 100 UNIT/ML Kwikpen Inject 40 Units into the skin in the morning and at bedtime.   tirzepatide [facility]) 5 MG/0.5ML Pen Inject 5 mg into the skin once a week.   [DISCONTINUED] esomeprazole (NEXIUM) 40 MG capsule TAKE 1 CAPSULE BY MOUTH EVERY DAY   [DISCONTINUED] ezetimibe (ZETIA) 10 MG tablet Take 1 tablet (10 mg total) by mouth daily.   No facility-administered encounter medications on file as of 09/04/2022.    ALLERGIES: Allergies  Allergen Reactions   Latex Itching and Rash   Morphine Other (See Comments)    Reaction with esophagus, unable to swallow.    Morphine And Related Shortness Of Breath and Swelling   Other Itching, Shortness Of Breath and Swelling   Ace Inhibitors Cough   Aspirin Other (See Comments)    Reaction with  esophagus, unable to swallow.    Losartan Cough   Tapentadol Other (See Comments)    Nausea, increased sleepiness, h/a   Adhesive [Tape] Rash   Tomato Other (See Comments)    Acid reflux due to acid in tomato    VACCINATION STATUS: Immunization History  Administered Date(s) Administered   Influenza Split 03/30/2011   Influenza Whole 03/21/2005, 02/09/2006   Influenza,inj,Quad PF,6+ Mos 02/11/2014, 07/27/2015, 03/09/2016, 01/29/2017, 02/07/2018, 01/30/2019, 02/11/2020, 03/03/2021, 12/29/2021   Influenza-Unspecified 01/29/2017   PFIZER(Purple Top)SARS-COV-2 Vaccination 07/22/2019, 08/12/2019, 03/05/2020, 11/19/2020   Pneumococcal Polysaccharide-23 04/17/2006, 09/14/2015   Td 04/17/2006    Diabetes She presents for her follow-up diabetic visit. She has type 2 diabetes mellitus. Her disease course has been improving. Hypoglycemia symptoms include nervousness/anxiousness, sweats and tremors. Pertinent negatives for hypoglycemia include no confusion, headaches, pallor or seizures. Associated symptoms include fatigue and weight loss. Pertinent negatives for diabetes include no blurred vision, no chest pain, no polydipsia, no polyphagia and no polyuria. There are no hypoglycemic complications. Symptoms are improving. Diabetic complications  include heart disease, nephropathy and peripheral neuropathy. Risk factors for coronary artery disease include dyslipidemia, diabetes mellitus, family history, hypertension, obesity, sedentary lifestyle, tobacco exposure and post-menopausal. Current diabetic treatment includes insulin injections and oral agent (dual therapy) (and mounjaro). She is compliant with treatment most of the time. Her weight is decreasing steadily. She is following a generally healthy diet. When asked about meal planning, she reported none. She has had a previous visit with a dietitian. She never participates in exercise. Her home blood glucose trend is decreasing steadily. Her overall blood  glucose range is 130-140 mg/dl. (She presents today, accompanied by her daughter, with her meter and logs showing improved glycemic profile overall with some mild hypoglycemia noted.  Her POCT A1c today is 7.8%, improving from last visit of 10.4%.  She reports she has tolerated the Mounjaro well, has drastically decreased the food noise and cravings.  Analysis of her meter shows 7-day average of 131, 14-day average of 123, 30-day average of 123, 90-day average of 154.) An ACE inhibitor/angiotensin II receptor blocker is contraindicated. She does not see a podiatrist.Eye exam is not current.  Hyperlipidemia This is a chronic problem. The current episode started more than 1 year ago. The problem is controlled. Recent lipid tests were reviewed and are normal. Exacerbating diseases include chronic renal disease, diabetes, hypothyroidism and obesity. Factors aggravating her hyperlipidemia include beta blockers and thiazides. Pertinent negatives include no chest pain, myalgias or shortness of breath. Current antihyperlipidemic treatment includes statins. The current treatment provides mild improvement of lipids. Compliance problems include adherence to diet and adherence to exercise.  Risk factors for coronary artery disease include diabetes mellitus, dyslipidemia, hypertension, obesity and a sedentary lifestyle.  Hypertension This is a chronic problem. The current episode started more than 1 year ago. The problem has been gradually improving since onset. The problem is controlled. Associated symptoms include peripheral edema and sweats. Pertinent negatives include no blurred vision, chest pain, headaches, palpitations or shortness of breath. Agents associated with hypertension include thyroid hormones. Risk factors for coronary artery disease include family history, dyslipidemia, diabetes mellitus, obesity, sedentary lifestyle and post-menopausal state. Past treatments include calcium channel blockers, diuretics and  beta blockers. The current treatment provides moderate improvement. Compliance problems include diet and exercise.  Hypertensive end-organ damage includes kidney disease, CAD/MI and heart failure. Identifiable causes of hypertension include chronic renal disease and sleep apnea.    Review of systems  Constitutional: +steadily decreasing body weight,  current Body mass index is 50.07 kg/m. , + fatigue, no subjective hyperthermia, no subjective hypothermia Eyes: no blurry vision, no xerophthalmia ENT: no sore throat, no nodules palpated in throat, no dysphagia/odynophagia, no hoarseness Cardiovascular: no chest pain, no shortness of breath, no palpitations, no leg swelling Respiratory: no cough, no SOB Gastrointestinal: no nausea/vomiting/diarrhea Musculoskeletal: no muscle/joint aches, walks with cane d/t hx of falls Skin: no rashes, no hyperemia Neurological: no tremors, no numbness, no tingling, no dizziness Psychiatric: no depression, no anxiety,    Objective:    BP 118/84 (BP Location: Left Arm, Patient Position: Sitting, Cuff Size: Large)   Pulse 83   Ht 5\' 4"  (1.626 m)   Wt 291 lb 11.2 oz (132.3 kg)   BMI 50.07 kg/m   Wt Readings from Last 3 Encounters:  09/04/22 291 lb 11.2 oz (132.3 kg)  06/20/22 (!) 309 lb (140.2 kg)  06/06/22 (!) 309 lb 3.2 oz (140.3 kg)    BP Readings from Last 3 Encounters:  09/04/22 118/84  06/20/22 128/82  06/06/22 134/78     Physical Exam- Limited  Constitutional:  Body mass index is 50.07 kg/m. , not in acute distress, normal state of mind Eyes:  EOMI, no exophthalmos Musculoskeletal: no gross deformities, strength intact in all four extremities, no gross restriction of joint movements, walks with cane due to disequilibrium and hx of falls Skin:  no rashes, no hyperemia Neurological: no tremor with outstretched hands   CMP ( most recent) CMP     Component Value Date/Time   NA 141 08/31/2022 0821   K 4.9 08/31/2022 0821   CL 102  08/31/2022 0821   CO2 21 08/31/2022 0821   GLUCOSE 118 (H) 08/31/2022 0821   GLUCOSE 316 (H) 02/11/2020 1111   BUN 13 08/31/2022 0821   CREATININE 1.07 (H) 08/31/2022 0821   CREATININE 0.91 02/11/2020 1111   CALCIUM 10.3 08/31/2022 0821   PROT 7.7 08/31/2022 0821   ALBUMIN 4.4 08/31/2022 0821   AST 30 08/31/2022 0821   ALT 34 (H) 08/31/2022 0821   ALKPHOS 57 08/31/2022 0821   BILITOT 0.2 08/31/2022 0821   GFRNONAA 70 05/06/2020 1201   GFRNONAA 69 02/11/2020 1111   GFRAA 80 05/06/2020 1201   GFRAA 80 02/11/2020 1111    Diabetic Labs (most recent): Lab Results  Component Value Date   HGBA1C 7.8 (A) 09/04/2022   HGBA1C 10.4 (A) 06/06/2022   HGBA1C 10.2 (H) 12/29/2021   MICROALBUR 101.9 (H) 11/10/2019   MICROALBUR 2.9 10/08/2017   MICROALBUR 33.7 (H) 07/06/2017     Lipid Panel ( most recent) Lipid Panel     Component Value Date/Time   CHOL 196 06/20/2022 1156   TRIG 358 (H) 06/20/2022 1156   HDL 34 (L) 06/20/2022 1156   CHOLHDL 5.8 (H) 06/20/2022 1156   CHOLHDL 4.0 11/10/2019 1303   VLDL 16 05/06/2018 0335   LDLCALC 101 (H) 06/20/2022 1156   LDLCALC 97 11/10/2019 1303      Assessment & Plan:   1) Uncontrolled type 2 diabetes mellitus with hyperglycemia (HCC)  - Angela Gauger has currently uncontrolled symptomatic type 2 DM since  62 years of age.  She presents today, accompanied by her daughter, with her meter and logs showing improved glycemic profile overall with some mild hypoglycemia noted.  Her POCT A1c today is 7.8%, improving from last visit of 10.4%.  She reports she has tolerated the Mounjaro well, has drastically decreased the food noise and cravings.  Analysis of her meter shows 7-day average of 131, 14-day average of 123, 30-day average of 123, 90-day average of 154.   Recent labs reviewed.  -her diabetes is complicated by coronary artery disease, CHF, obesity/sedentary life and she remains at a high risk for more acute and chronic complications which  include CAD, CVA, CKD, retinopathy, and neuropathy. These are all discussed in detail with her.  The following Lifestyle Medicine recommendations according to American College of Lifestyle Medicine [facility]) were discussed and offered to patient and she agrees to start the journey:  A. Whole Foods, Plant-based plate comprising of fruits and vegetables, plant-based proteins, whole-grain carbohydrates was discussed in detail with the patient.   A list for source of those nutrients were also provided to the patient.  Patient will use only water or unsweetened tea for hydration. B.  The need to stay away from risky substances including alcohol, smoking; obtaining 7 to 9 hours of restorative sleep, at least 150 minutes of moderate intensity exercise weekly, the importance of healthy social connections,  and stress reduction  techniques were discussed. C.  A full color page of  Calorie density of various food groups per pound showing examples of each food groups was provided to the patient.  - Nutritional counseling repeated at each appointment due to patients tendency to fall back in to old habits.  - The patient admits there is a room for improvement in their diet and drink choices. -  Suggestion is made for the patient to avoid simple carbohydrates from their diet including Cakes, Sweet Desserts / Pastries, Ice Cream, Soda (diet and regular), Sweet Tea, Candies, Chips, Cookies, Sweet Pastries, Store Bought Juices, Alcohol in Excess of 1-2 drinks a day, Artificial Sweeteners, Coffee Creamer, and "Sugar-free" Products. This will help patient to have stable blood glucose profile and potentially avoid unintended weight gain.   - I encouraged the patient to switch to unprocessed or minimally processed complex starch and increased protein intake (animal or plant source), fruits, and vegetables.   - Patient is advised to stick to a routine mealtimes to eat 3 meals a day and avoid unnecessary snacks (to snack only to  correct hypoglycemia).  - I have approached her with the following individualized plan to manage diabetes and patient agrees:   -Given her presentation with chronic glycemic burden, she will likely require multiple daily injections of insulin in order for her to achieve control of diabetes to target.  -She is advised to continue her Humalog 75/25  40 units SQ twice daily with breakfast and supper (may reduce on subsequent visits), continue Metformin 500 mg po twice daily with meals and continue Mounjaro 5 mg SQ weekly (will hold off on advancing for now due to drug shortage).  She is advised to stop her Glipizide altogether today- due to random hypoglycemia.  S  -She is encouraged to consistently monitor glucose 3 times daily, before injecting insulin and before bed, and to call the clinic if she has readings less than 70 or greater than 300 for 3 tests in a row.  She could not tolerate the Dexcom G7 (had allergic reaction to adhesive).  - she is warned not to take insulin without proper monitoring per orders.  2) Blood Pressure /Hypertension: Her blood pressure is controlled to target.  She is advised to continue Norvasc 10 mg po daily, Clonidine 0.3 mg patch once weekly, Clonidine 0.3 mg po daily at bedtime, Lasix 40 mg po daily, Spironolactone 25 mg po daily, and Metoprolol 25 mg po twice daily.  3) Lipids/Hyperlipidemia:  Her most recent lipid panel from 12/29/21 shows uncontrolled LDL at 124 (worsening).  She is advised to continue Lipitor 80 mg po every day at bedtime and Fenofibrate 48 mg po daily.    4)  Weight/Diet:  Her Body mass index is 50.07 kg/m. - clearly complicating her diabetes care.  She is a candidate for modest weight loss.  She has multiple comorbid situations complicating her chance of bariatric surgery.  I discussed with her the fact that loss of 5 - 10% of her  current body weight will have the most impact on her diabetes management.  CDE Consult will be initiated .  Exercise, and detailed carbohydrates information provided  -  detailed on discharge instructions.  5) Hypothyroidism:  Her previsit thyroid function tests are consistent with appropriate hormone replacement.  She is advised to continue Levothyroxine 75 mcg po daily before breakfast.     - We discussed about the correct intake of her thyroid hormone, on empty stomach at fasting, with water, separated  by at least 30 minutes from breakfast and other medications,  and separated by more than 4 hours from calcium, iron, multivitamins, acid reflux medications (PPIs). -Patient is made aware of the fact that thyroid hormone replacement is needed for life, dose to be adjusted by periodic monitoring of thyroid function tests.  6) Chronic Care/Health Maintenance: -she is not on ACE/ARB (intolerant to both) is on statin medications and is encouraged to initiate and continue to follow up with Ophthalmology, Dentist,  Podiatrist at least yearly or according to recommendations, and advised to  stay away from smoking. I have recommended yearly flu vaccine and pneumonia vaccine at least every 5 years; moderate intensity exercise for up to 150 minutes weekly; and  sleep for at least 7 hours a day.  - she is advised to maintain close follow up with Kerri Perches, MD for primary care needs, as well as her other providers for optimal and coordinated care.     I spent  36  minutes in the care of the patient today including review of labs from CMP, Lipids, Thyroid Function, Hematology (current and previous including abstractions from other facilities); face-to-face time discussing  her blood glucose readings/logs, discussing hypoglycemia and hyperglycemia episodes and symptoms, medications doses, her options of short and long term treatment based on the latest standards of care / guidelines;  discussion about incorporating lifestyle medicine;  and documenting the encounter. Risk reduction counseling performed per  USPSTF guidelines to reduce obesity and cardiovascular risk factors.     Please refer to Patient Instructions for Blood Glucose Monitoring and Insulin/Medications Dosing Guide"  in media tab for additional information. Please  also refer to " Patient Self Inventory" in the Media  tab for reviewed elements of pertinent patient history.  Angela Fifer participated in the discussions, expressed understanding, and voiced agreement with the above plans.  All questions were answered to her satisfaction. she is encouraged to contact clinic should she have any questions or concerns prior to her return visit.   Follow up plan: - Return in about 4 months (around 01/05/2023) for Diabetes F/U with A1c in office, No previsit labs, Bring meter and logs.   Ronny Bacon, [facility] [address] [place], [place] [address] Phone: [phone] Fax: [phone]   09/04/2022, 12:40 PM

## 2022-09-12 DIAGNOSIS — M25561 Pain in right knee: Secondary | ICD-10-CM | POA: Diagnosis not present

## 2022-09-12 DIAGNOSIS — M25562 Pain in left knee: Secondary | ICD-10-CM | POA: Diagnosis not present

## 2022-09-13 ENCOUNTER — Telehealth: Payer: Self-pay | Admitting: Nurse Practitioner

## 2022-09-13 MED ORDER — TIRZEPATIDE 5 MG/0.5ML ~~LOC~~ SOAJ: 5.0000 mg | SUBCUTANEOUS | 1 refills | Status: DC

## 2022-09-13 NOTE — Telephone Encounter (Signed)
Pt said that [facility] on [address] has the 0.5mg  and 7.5mg . Can you call one in?

## 2022-09-13 NOTE — Telephone Encounter (Signed)
Pt said that her Pharmacy is out of stock for [facility] - she will call around and see who has it and let us knw

## 2022-09-13 NOTE — Telephone Encounter (Signed)
I sent in the Mounjaro 5 mg SQ weekly to [facility] on [address]

## 2022-09-14 ENCOUNTER — Ambulatory Visit (INDEPENDENT_AMBULATORY_CARE_PROVIDER_SITE_OTHER): Payer: 59 | Admitting: Pulmonary Disease

## 2022-09-14 ENCOUNTER — Encounter: Payer: Self-pay | Admitting: Pulmonary Disease

## 2022-09-14 VITALS — BP 137/77 | HR 81 | Ht 64.0 in | Wt 297.6 lb

## 2022-09-14 DIAGNOSIS — J4530 Mild persistent asthma, uncomplicated: Secondary | ICD-10-CM | POA: Diagnosis not present

## 2022-09-14 DIAGNOSIS — G4733 Obstructive sleep apnea (adult) (pediatric): Secondary | ICD-10-CM | POA: Diagnosis not present

## 2022-09-14 DIAGNOSIS — I5032 Chronic diastolic (congestive) heart failure: Secondary | ICD-10-CM

## 2022-09-14 NOTE — Assessment & Plan Note (Signed)
Not convinced about true asthma.  Certainly pollen season is a problem.  She will continue on Symbicort twice daily and albuterol as needed for rescue.  She does use antihistaminics during seasonal allergies

## 2022-09-14 NOTE — Patient Instructions (Signed)
  X refills on lasix as needed x 1  X ONO on CPAP/RA

## 2022-09-14 NOTE — Progress Notes (Signed)
   Subjective:    [name], female    DOB: 01-Oct-1960, 62 y.o.   MRN: [id number]  HPI  62  yo morbidly obese never smoker for follow-up of "asthma" and  OSA  -on CPAP 10 cm with full facemask , DME - [employer] -on O2 since 2021   Asthma was diagnosed in 2014  She reports onset of intermittent wheezing and a dry cough since her cardiac cath in 05/2018.     PMH -  difficult to control hypertension  on carvedilol, amlodipine and clonidine, HFpEF chronic opiate use  Chief Complaint  Patient presents with   Follow-up   7 month FU visit Accompanied by her daughter who was a home caregiver. She is somewhat upset that she ran out of fluid pills for a week and it took our office some time to refill it.  She also follows up with cardiology and primary care. She has started on [facility] and this is really helped bring down her HbA1c to 7.8.  She has lost 20 pounds to her current weight of 297.  She states she is to 62 at home. Breathing was bad during pollen season last couple of months but is now improving.  She remains on Symbicort and uses albuterol for rescue. No problems with CPAP mask or pressure.  She is very compliant.  She uses oxygen as needed during the daytime but does not have it blended into her machine at night  Reviewed labs from 5/2 potassium 4.9, BUN/creatinine 13/1.1   Significant tests/ events reviewed  07/2019-PFTs -ratio normal, FEV1 1.94/91%, significant bronchodilator response, improved to 105%, high normal DLCO 2015 normal spirometry   07/07/2019-right heart cath-no evidence of pulmonary hypertension, suspect noncardiac etiology for dyspnea although diastolic dysfunction component is also possible   Split night study 01/2016 >> AHI 37/h >> CPAP 10 cm   Cardiac cath 05/2018 no significant CAD, LVEDP 14   Echo 05/2018 grade 2 diastolic dysfunction, normal LVEF   CT renal stone study 06/2017 >> bibasilar atelectasis, no ILD     01/2016  split-night study-AHI 22/hour, CPAP 10 cm   Review of Systems neg for any significant sore throat, dysphagia, itching, sneezing, nasal congestion or excess/ purulent secretions, fever, chills, sweats, unintended wt loss, pleuritic or exertional cp, hempoptysis, orthopnea pnd or change in chronic leg swelling. Also denies presyncope, palpitations, heartburn, abdominal pain, nausea, vomiting, diarrhea or change in bowel or urinary habits, dysuria,hematuria, rash, arthralgias, visual complaints, headache, numbness weakness or ataxia.     Objective:   Physical Exam  Gen. Pleasant, obese, in no distress ENT - no lesions, no post nasal drip Neck: No JVD, no thyromegaly, no carotid bruits Lungs: no use of accessory muscles, no dullness to percussion, decreased without rales or rhonchi  Cardiovascular: Rhythm regular, heart sounds  normal, no murmurs or gallops, 1+ peripheral edema Musculoskeletal: No deformities, no cyanosis or clubbing , no tremors        Assessment & Plan:

## 2022-09-14 NOTE — Assessment & Plan Note (Signed)
CPAP download was reviewed which shows excellent control of events on 10 cm.  She is very compliant.  CPAP is definitely helped improve her daytime somnolence and fatigue. We will check nocturnal oximetry on CPAP/room air to see if she needs to have oxygen blended into her machine.  Weight loss encouraged, compliance with goal of at least 4-6 hrs every night is the expectation. Advised against medications with sedative side effects Cautioned against driving when sleepy - understanding that sleepiness will vary on a day to day basis

## 2022-09-14 NOTE — Assessment & Plan Note (Signed)
90-day refill was provided on Lasix and we will provide another refill on this so that she is good with 6 months.  I do not want her to run out of Lasix. This can also be filled by cardiology in case there is a delay.  I have asked her to call us a few days in advance. Labs show normal potassium and stable renal function

## 2022-09-15 ENCOUNTER — Other Ambulatory Visit: Payer: Self-pay | Admitting: Family Medicine

## 2022-09-19 DIAGNOSIS — R2689 Other abnormalities of gait and mobility: Secondary | ICD-10-CM | POA: Diagnosis not present

## 2022-09-19 DIAGNOSIS — M5116 Intervertebral disc disorders with radiculopathy, lumbar region: Secondary | ICD-10-CM | POA: Diagnosis not present

## 2022-09-20 ENCOUNTER — Other Ambulatory Visit (HOSPITAL_COMMUNITY): Payer: Self-pay | Admitting: Family Medicine

## 2022-09-20 ENCOUNTER — Ambulatory Visit (INDEPENDENT_AMBULATORY_CARE_PROVIDER_SITE_OTHER): Payer: 59 | Admitting: Family Medicine

## 2022-09-20 ENCOUNTER — Encounter: Payer: Self-pay | Admitting: Family Medicine

## 2022-09-20 VITALS — BP 124/79 | HR 92 | Ht 64.0 in | Wt 291.0 lb

## 2022-09-20 DIAGNOSIS — E785 Hyperlipidemia, unspecified: Secondary | ICD-10-CM

## 2022-09-20 DIAGNOSIS — E038 Other specified hypothyroidism: Secondary | ICD-10-CM | POA: Diagnosis not present

## 2022-09-20 DIAGNOSIS — N6320 Unspecified lump in the left breast, unspecified quadrant: Secondary | ICD-10-CM

## 2022-09-20 DIAGNOSIS — E559 Vitamin D deficiency, unspecified: Secondary | ICD-10-CM

## 2022-09-20 DIAGNOSIS — G4733 Obstructive sleep apnea (adult) (pediatric): Secondary | ICD-10-CM

## 2022-09-20 DIAGNOSIS — R52 Pain, unspecified: Secondary | ICD-10-CM | POA: Diagnosis not present

## 2022-09-20 DIAGNOSIS — I10 Essential (primary) hypertension: Secondary | ICD-10-CM

## 2022-09-20 DIAGNOSIS — I5032 Chronic diastolic (congestive) heart failure: Secondary | ICD-10-CM | POA: Diagnosis not present

## 2022-09-20 DIAGNOSIS — E782 Mixed hyperlipidemia: Secondary | ICD-10-CM

## 2022-09-20 DIAGNOSIS — E1165 Type 2 diabetes mellitus with hyperglycemia: Secondary | ICD-10-CM

## 2022-09-20 DIAGNOSIS — F324 Major depressive disorder, single episode, in partial remission: Secondary | ICD-10-CM

## 2022-09-20 DIAGNOSIS — F322 Major depressive disorder, single episode, severe without psychotic features: Secondary | ICD-10-CM

## 2022-09-20 DIAGNOSIS — Z1231 Encounter for screening mammogram for malignant neoplasm of breast: Secondary | ICD-10-CM

## 2022-09-20 MED ORDER — POTASSIUM CHLORIDE CRYS ER 20 MEQ PO TBCR: 20.0000 meq | EXTENDED_RELEASE_TABLET | Freq: Two times a day (BID) | ORAL | 1 refills | Status: DC

## 2022-09-20 MED ORDER — ERGOCALCIFEROL 1.25 MG (50000 UT) PO CAPS: 50000.0000 [IU] | ORAL_CAPSULE | ORAL | 2 refills | Status: DC

## 2022-09-20 NOTE — Patient Instructions (Addendum)
F/U in 13 weeks, call if you need me sooner  CBC, lipid, Vit D today  Please schedule mammogram due in July at checkout  Non fasting hepatic panel in 5 weeks  Zolpidem, oxycontin, diazepam and gabapentin will al be refilled to cover next 3 months   CONGRATS on MUCH IMPROVED blood sugar and weight loss  You will continue to do extremely well   It is important that you exercise regularly at least 30 minutes 5 times a week. If you develop chest pain, have severe difficulty breathing, or feel very tired, stop exercising immediately and seek medical attention  Thanks for choosing [facility], we consider it a privelige to serve you.

## 2022-09-21 LAB — CBC
Hematocrit: 41.6 % (ref 34.0–46.6)
Hemoglobin: 12.9 g/dL (ref 11.1–15.9)
MCH: 25.3 pg — ABNORMAL LOW (ref 26.6–33.0)
MCHC: 31 g/dL — ABNORMAL LOW (ref 31.5–35.7)
MCV: 82 fL (ref 79–97)
Platelets: 503 10*3/uL — ABNORMAL HIGH (ref 150–450)
RBC: 5.1 x10E6/uL (ref 3.77–5.28)
RDW: 13.6 % (ref 11.7–15.4)
WBC: 4.8 10*3/uL (ref 3.4–10.8)

## 2022-09-21 LAB — LIPID PANEL
Chol/HDL Ratio: 5.1 ratio — ABNORMAL HIGH (ref 0.0–4.4)
Cholesterol, Total: 172 mg/dL (ref 100–199)
HDL: 34 mg/dL — ABNORMAL LOW (ref >39)
LDL Chol Calc (NIH): 102 mg/dL — ABNORMAL HIGH (ref 0–99)
Triglycerides: 206 mg/dL — ABNORMAL HIGH (ref 0–149)
VLDL Cholesterol Cal: 36 mg/dL (ref 5–40)

## 2022-09-21 LAB — VITAMIN D 25 HYDROXY (VIT D DEFICIENCY, FRACTURES): Vit D, 25-Hydroxy: 43 ng/mL (ref 30.0–100.0)

## 2022-09-24 MED ORDER — OXYCODONE HCL ER 40 MG PO T12A: 40.0000 mg | EXTENDED_RELEASE_TABLET | Freq: Two times a day (BID) | ORAL | 0 refills | Status: DC

## 2022-09-24 MED ORDER — DULOXETINE HCL 60 MG PO CPEP: 60.0000 mg | ORAL_CAPSULE | Freq: Two times a day (BID) | ORAL | 2 refills | Status: DC

## 2022-09-24 MED ORDER — ROSUVASTATIN CALCIUM 10 MG PO TABS
ORAL_TABLET | ORAL | 3 refills | Status: DC
Start: 2022-09-24 — End: 2022-12-24

## 2022-09-24 MED ORDER — CLONIDINE 0.3 MG/24HR TD PTWK: MEDICATED_PATCH | TRANSDERMAL | 3 refills | Status: DC

## 2022-09-24 NOTE — Assessment & Plan Note (Signed)
Controlled, no change in medication DASH diet and commitment to daily physical activity for a minimum of 30 minutes discussed and encouraged, as a part of hypertension management. The importance of attaining a healthy weight is also discussed.     09/20/2022   10:26 AM 09/14/2022    4:07 PM 09/04/2022   11:37 AM 06/20/2022   11:22 AM 06/20/2022   10:56 AM 06/20/2022   10:55 AM 06/06/2022    1:57 PM  BP/Weight  Systolic BP 124 137 118 128 152 148 134  Diastolic BP 79 77 84 82 96 89 78  Wt. (Lbs) 291 297.6 291.7   309 309.2  BMI 49.95 kg/m2 51.08 kg/m2 50.07 kg/m2   53.04 kg/m2 53.07 kg/m2

## 2022-09-24 NOTE — Progress Notes (Signed)
Angela [name]     MRN: [id number]      DOB: [birth year]  Chief Complaint  Patient presents with   Follow-up    Follow up    HPI Angela [name] is here for follow up and re-evaluation of chronic medical conditions, medication management and review of any available recent lab and radiology data.  Preventive health is updated, specifically  Cancer screening and Immunization.   Questions or concerns regarding consultations or procedures which the PT has had in the interim are  addressed. The PT denies any adverse reactions to current medications since the last visit. Recently started [facility] and has had excellent results as far as blood sugar control and weight loss are concerned There are no new concerns.  There are no specific complaints   ROS Denies recent fever or chills. Denies sinus pressure, nasal congestion, ear pain or sore throat. Denies chest congestion, productive cough or wheezing. Denies chest pains, palpitations and leg swelling Denies abdominal pain, nausea, vomiting,diarrhea or constipation.   Denies dysuria, frequency, hesitancy or incontinence.c/o chronic  joint pain, swelling and limitation in mobility. Denies headaches, seizures, numbness, or tingling. Denies depression, anxiety or insomnia. Denies skin break down or rash.   PE  BP 124/79 (BP Location: Right Arm, Patient Position: Sitting, Cuff Size: Large)   Pulse 92   Ht 5\' 4"  (1.626 m)   Wt 291 lb (132 kg)   SpO2 94%   BMI 49.95 kg/m   Patient alert and oriented and in no cardiopulmonary distress.  HEENT: No facial asymmetry, EOMI,     Neck supple .  Chest: Clear to auscultation bilaterally.  CVS: S1, S2 no murmurs, no S3.Regular rate.  ABD: Soft non tender.   Ext: No edema  MS: decreased  ROM spine, shoulders, hips and knees.  Skin: Intact, no ulcerations or rash noted.  Psych: Good eye contact, normal affect. Memory intact not anxious or depressed appearing.  CNS: CN 2-12 intact, power,   normal throughout.no focal deficits noted.   Assessment & Plan  Vitamin D deficiency Controlled, no change in medication   Uncontrolled type 2 diabetes mellitus with hyperglycemia (HCC) Marked improvement , managed by Endo Angela [name] is reminded of the importance of commitment to daily physical activity for 30 minutes or more, as able and the need to limit carbohydrate intake to 30 to 60 grams per meal to help with blood sugar control.   The need to take medication as prescribed, test blood sugar as directed, and to call between visits if there is a concern that blood sugar is uncontrolled is also discussed.   Angela [name] is reminded of the importance of daily foot exam, annual eye examination, and good blood sugar, blood pressure and cholesterol control.     Latest Ref Rng & Units 09/20/2022   11:43 AM 09/04/2022   11:56 AM 08/31/2022    8:21 AM 06/20/2022   11:56 AM 06/06/2022    2:18 PM  Diabetic Labs  HbA1c 4.0 - 5.6 %  7.8    10.4   Chol 100 - 199 mg/dL 409    811    HDL >91 mg/dL 34    34    Calc LDL 0 - 99 mg/dL 478    295    Triglycerides 0 - 149 mg/dL 621    308    Creatinine 0.57 - 1.00 mg/dL   6.57  8.46        9/62/9528   10:26 AM 09/14/2022  4:07 PM 09/04/2022   11:37 AM 06/20/2022   11:22 AM 06/20/2022   10:56 AM 06/20/2022   10:55 AM 06/06/2022    1:57 PM  BP/Weight  Systolic BP 124 137 118 128 152 148 134  Diastolic BP 79 77 84 82 96 89 78  Wt. (Lbs) 291 297.6 291.7   309 309.2  BMI 49.95 kg/m2 51.08 kg/m2 50.07 kg/m2   53.04 kg/m2 53.07 kg/m2      Latest Ref Rng & Units 05/10/2022   12:00 AM 01/19/2021   12:00 AM  Foot/eye exam completion dates  Eye Exam No Retinopathy No Retinopathy  No Retinopathy         This result is from an external source.        OSA (obstructive sleep apnea) Reports compliance  Morbid obesity (HCC) Improved  Patient re-educated about  the importance of commitment to a  minimum of 150 minutes of exercise per week as able.  The  importance of healthy food choices with portion control discussed, as well as eating regularly and within a 12 hour window most days. The need to choose "clean , green" food 50 to 75% of the time is discussed, as well as to make water the primary drink and set a goal of 64 ounces water daily.       09/20/2022   10:26 AM 09/14/2022    4:07 PM 09/04/2022   11:37 AM  Weight /BMI  Weight 291 lb 297 lb 9.6 oz 291 lb 11.2 oz  Height 5\' 4"  (1.626 m) 5\' 4"  (1.626 m) 5\' 4"  (1.626 m)  BMI 49.95 kg/m2 51.08 kg/m2 50.07 kg/m2      Encounter for pain management The patient's Controlled Substance registry is reviewed and compliance confirmed. Adequacy of  Pain control and level of function is assessed. Medication dosing is adjusted as deemed appropriate. Twelve weeks of medication is prescribed , with a follow up appointment between 11 to 12 weeks .   Essential hypertension, benign Controlled, no change in medication DASH diet and commitment to daily physical activity for a minimum of 30 minutes discussed and encouraged, as a part of hypertension management. The importance of attaining a healthy weight is also discussed.     09/20/2022   10:26 AM 09/14/2022    4:07 PM 09/04/2022   11:37 AM 06/20/2022   11:22 AM 06/20/2022   10:56 AM 06/20/2022   10:55 AM 06/06/2022    1:57 PM  BP/Weight  Systolic BP 124 137 118 128 152 148 134  Diastolic BP 79 77 84 82 96 89 78  Wt. (Lbs) 291 297.6 291.7   309 309.2  BMI 49.95 kg/m2 51.08 kg/m2 50.07 kg/m2   53.04 kg/m2 53.07 kg/m2       Depression, Lanum, single episode, severe (HCC) Controlled, no change in medication   Hyperlipidemia LDL goal <100 Hyperlipidemia:Low fat diet discussed and encouraged.   Lipid Panel  Lab Results  Component Value Date   CHOL 172 09/20/2022   HDL 34 (L) 09/20/2022   LDLCALC 102 (H) 09/20/2022   TRIG 206 (H) 09/20/2022   CHOLHDL 5.1 (H) 09/20/2022     Needs to start low dose statin  Hypothyroidism Managed by  Endo  Depression Controlled, no change in medication   Chronic diastolic heart failure (HCC) Stable and controlled, no s/s of decompensation

## 2022-09-24 NOTE — Assessment & Plan Note (Signed)
Managed by Endo 

## 2022-09-24 NOTE — Assessment & Plan Note (Signed)
Hyperlipidemia:Low fat diet discussed and encouraged.   Lipid Panel  Lab Results  Component Value Date   CHOL 172 09/20/2022   HDL 34 (L) 09/20/2022   LDLCALC 102 (H) 09/20/2022   TRIG 206 (H) 09/20/2022   CHOLHDL 5.1 (H) 09/20/2022     Needs to start low dose statin

## 2022-09-24 NOTE — Assessment & Plan Note (Signed)
The patient's Controlled Substance registry is reviewed and compliance confirmed. Adequacy of  Pain control and level of function is assessed. Medication dosing is adjusted as deemed appropriate. Twelve weeks of medication is prescribed , with a follow up appointment between 11 to 12 weeks .  

## 2022-09-24 NOTE — Assessment & Plan Note (Signed)
Controlled, no change in medication  

## 2022-09-24 NOTE — Assessment & Plan Note (Signed)
Reports compliance

## 2022-09-24 NOTE — Assessment & Plan Note (Signed)
Improved  Patient re-educated about  the importance of commitment to a  minimum of 150 minutes of exercise per week as able.  The importance of healthy food choices with portion control discussed, as well as eating regularly and within a 12 hour window most days. The need to choose "clean , green" food 50 to 75% of the time is discussed, as well as to make water the primary drink and set a goal of 64 ounces water daily.       09/20/2022   10:26 AM 09/14/2022    4:07 PM 09/04/2022   11:37 AM  Weight /BMI  Weight 291 lb 297 lb 9.6 oz 291 lb 11.2 oz  Height 5\' 4"  (1.626 m) 5\' 4"  (1.626 m) 5\' 4"  (1.626 m)  BMI 49.95 kg/m2 51.08 kg/m2 50.07 kg/m2

## 2022-09-24 NOTE — Assessment & Plan Note (Signed)
Stable and controlled, no s/s of decompensation

## 2022-09-24 NOTE — Assessment & Plan Note (Signed)
Marked improvement , managed by Endo Angela [name] is reminded of the importance of commitment to daily physical activity for 30 minutes or more, as able and the need to limit carbohydrate intake to 30 to 60 grams per meal to help with blood sugar control.   The need to take medication as prescribed, test blood sugar as directed, and to call between visits if there is a concern that blood sugar is uncontrolled is also discussed.   Angela [name] is reminded of the importance of daily foot exam, annual eye examination, and good blood sugar, blood pressure and cholesterol control.     Latest Ref Rng & Units 09/20/2022   11:43 AM 09/04/2022   11:56 AM 08/31/2022    8:21 AM 06/20/2022   11:56 AM 06/06/2022    2:18 PM  Diabetic Labs  HbA1c 4.0 - 5.6 %  7.8    10.4   Chol 100 - 199 mg/dL 161    096    HDL >04 mg/dL 34    34    Calc LDL 0 - 99 mg/dL 540    981    Triglycerides 0 - 149 mg/dL 191    478    Creatinine 0.57 - 1.00 mg/dL   2.95  6.21        07/06/6576   10:26 AM 09/14/2022    4:07 PM 09/04/2022   11:37 AM 06/20/2022   11:22 AM 06/20/2022   10:56 AM 06/20/2022   10:55 AM 06/06/2022    1:57 PM  BP/Weight  Systolic BP 124 137 118 128 152 148 134  Diastolic BP 79 77 84 82 96 89 78  Wt. (Lbs) 291 297.6 291.7   309 309.2  BMI 49.95 kg/m2 51.08 kg/m2 50.07 kg/m2   53.04 kg/m2 53.07 kg/m2      Latest Ref Rng & Units 05/10/2022   12:00 AM 01/19/2021   12:00 AM  Foot/eye exam completion dates  Eye Exam No Retinopathy No Retinopathy  No Retinopathy         This result is from an external source.

## 2022-09-26 ENCOUNTER — Other Ambulatory Visit: Payer: Self-pay

## 2022-09-26 DIAGNOSIS — R5381 Other malaise: Secondary | ICD-10-CM

## 2022-09-26 MED ORDER — UNABLE TO FIND
0 refills | Status: AC
Start: 2022-09-26 — End: ?

## 2022-10-05 DIAGNOSIS — R0609 Other forms of dyspnea: Secondary | ICD-10-CM | POA: Diagnosis not present

## 2022-10-09 ENCOUNTER — Telehealth: Payer: Self-pay | Admitting: Nurse Practitioner

## 2022-10-09 ENCOUNTER — Other Ambulatory Visit: Payer: Self-pay | Admitting: *Deleted

## 2022-10-09 DIAGNOSIS — E1165 Type 2 diabetes mellitus with hyperglycemia: Secondary | ICD-10-CM

## 2022-10-09 DIAGNOSIS — E1159 Type 2 diabetes mellitus with other circulatory complications: Secondary | ICD-10-CM

## 2022-10-09 MED ORDER — TIRZEPATIDE 5 MG/0.5ML ~~LOC~~ SOAJ
5.0000 mg | SUBCUTANEOUS | 1 refills | Status: DC
Start: 2022-10-09 — End: 2022-10-11

## 2022-10-09 NOTE — Telephone Encounter (Signed)
Patient states that [facility] on [address] has her Greggory Keen and is asking that we sent the Rx there

## 2022-10-09 NOTE — Telephone Encounter (Signed)
Pt said that [facility] on [address] has Mounjaro in stock. Can you send her RX there?

## 2022-10-11 MED ORDER — TIRZEPATIDE 5 MG/0.5ML ~~LOC~~ SOAJ
5.0000 mg | SUBCUTANEOUS | 0 refills | Status: DC
Start: 2022-10-11 — End: 2023-01-09

## 2022-10-11 NOTE — Telephone Encounter (Signed)
Has this been sent in?  °

## 2022-10-11 NOTE — Telephone Encounter (Signed)
Rx sent to [facility] on [address].

## 2022-10-21 ENCOUNTER — Other Ambulatory Visit: Payer: Self-pay | Admitting: Family Medicine

## 2022-10-21 DIAGNOSIS — M62830 Muscle spasm of back: Secondary | ICD-10-CM

## 2022-11-04 DIAGNOSIS — R0609 Other forms of dyspnea: Secondary | ICD-10-CM | POA: Diagnosis not present

## 2022-11-08 ENCOUNTER — Other Ambulatory Visit: Payer: Self-pay | Admitting: "Endocrinology

## 2022-11-08 ENCOUNTER — Ambulatory Visit (INDEPENDENT_AMBULATORY_CARE_PROVIDER_SITE_OTHER): Payer: 59 | Admitting: Podiatry

## 2022-11-08 ENCOUNTER — Encounter: Payer: Self-pay | Admitting: Podiatry

## 2022-11-08 VITALS — BP 146/77

## 2022-11-08 DIAGNOSIS — L84 Corns and callosities: Secondary | ICD-10-CM | POA: Diagnosis not present

## 2022-11-08 DIAGNOSIS — Q828 Other specified congenital malformations of skin: Secondary | ICD-10-CM

## 2022-11-08 DIAGNOSIS — B351 Tinea unguium: Secondary | ICD-10-CM

## 2022-11-08 DIAGNOSIS — E1142 Type 2 diabetes mellitus with diabetic polyneuropathy: Secondary | ICD-10-CM

## 2022-11-08 DIAGNOSIS — M79675 Pain in left toe(s): Secondary | ICD-10-CM | POA: Diagnosis not present

## 2022-11-08 DIAGNOSIS — M79674 Pain in right toe(s): Secondary | ICD-10-CM | POA: Diagnosis not present

## 2022-11-08 NOTE — Progress Notes (Signed)
  Subjective:  [name], female    DOB: 15-Apr-1961,  MRN: [id number]  Angela [name] presents to clinic today for at risk foot care with history of diabetic neuropathy and painful porokeratotic lesion(s) right lower extremity and painful mycotic toenails that limit ambulation. Painful toenails interfere with ambulation. Aggravating factors include wearing enclosed shoe gear. Pain is relieved with periodic professional debridement. Painful porokeratotic lesions are aggravated when weightbearing with and without shoegear. Pain is relieved with periodic professional debridement.  Chief Complaint  Patient presents with   Diabetes    Diabetic foot care    New problem(s): None.   PCP is Kerri Perches, MD.  Allergies  Allergen Reactions   Latex Itching and Rash   Morphine Other (See Comments)    Reaction with esophagus, unable to swallow.    Morphine And Codeine Shortness Of Breath and Swelling   Other Itching, Shortness Of Breath and Swelling   Ace Inhibitors Cough   Aspirin Other (See Comments)    Reaction with esophagus, unable to swallow.    Losartan Cough   Tapentadol Other (See Comments)    Nausea, increased sleepiness, h/a   Adhesive [Tape] Rash   Tomato Other (See Comments)    Acid reflux due to acid in tomato    Review of Systems: Negative except as noted in the HPI.  Objective:  Vitals:   11/08/22 0835  BP: (!) 146/77   Angela [name] is a pleasant 62 y.o. female morbidly obese in NAD. AAO x 3.  Neurovascular Examination: CFT immediate b/l LE. Palpable DP/PT pulses b/l LE. Digital hair sparse b/l. Skin temperature gradient WNL b/l. No pain with calf compression b/l. No edema noted b/l. No cyanosis or clubbing noted b/l LE.  Pt has subjective symptoms of neuropathy. Protective sensation decreased with 10 gram monofilament b/l.  Dermatological:  Pedal integument with normal turgor, texture and tone b/l LE. No open wounds b/l. No interdigital  macerations b/l. Toenails 1-5 b/l elongated, thickened, discolored with subungual debris. +Tenderness with dorsal palpation of nailplates.   Porokeratotic lesion(s) noted plantar aspect of right heel. Hyperkeratotic lesion submet head 5 left foot and plantarlateral midfoot right foot. No erythema, no edema, no drainage, no fluctuance.  Musculoskeletal:  Muscle strength 5/5 to all lower extremity muscle groups bilaterally. HAV with bunion deformity noted b/l LE. Utilizes cane for ambulation assistance.  Assessment/Plan: 1. Pain due to onychomycosis of toenails of both feet   2. Porokeratosis   3. Callus   4. Diabetic peripheral neuropathy associated with type 2 diabetes mellitus (HCC)     -Consent given for treatment as described below: -Examined patient. -Continue supportive shoe gear daily. -Mycotic toenails 1-5 bilaterally were debrided in length and girth with sterile nail nippers and dremel without incident. -Callus(es) submet head 5 left foot and plantarlateral aspect of midfoot right foot pared utilizing rotary bur without complication or incident. Total number pared =2. -Porokeratotic lesion(s) right heel pared and enucleated with sterile currette without incident. Total number of lesions debrided=1. -Patient/POA to call should there be question/concern in the interim.   Return in about 3 months (around 02/08/2023).  Freddie Breech, DPM

## 2022-11-16 ENCOUNTER — Telehealth: Payer: Self-pay | Admitting: *Deleted

## 2022-11-16 DIAGNOSIS — G4733 Obstructive sleep apnea (adult) (pediatric): Secondary | ICD-10-CM

## 2022-11-16 NOTE — Telephone Encounter (Signed)
Patient called and states she has had some rattling in her chest at night and it was recommended last OV with Dr. Vassie Loll to receive a cpap machine, but states she has not received it.   Please call and advise patient. [phone]

## 2022-11-16 NOTE — Telephone Encounter (Signed)
Per LOV 09/14/22 Alva requested ONO on CPAP/RA. Per chart this has not been ordered. Pt currently on CPAP.  Order placed for ONO on CPAP/RA.  Call placed to patient. LMTRC.

## 2022-11-21 ENCOUNTER — Encounter (HOSPITAL_COMMUNITY): Payer: Self-pay

## 2022-11-21 ENCOUNTER — Ambulatory Visit (HOSPITAL_COMMUNITY): Admission: RE | Admit: 2022-11-21 | Payer: 59 | Source: Ambulatory Visit

## 2022-11-21 ENCOUNTER — Ambulatory Visit (HOSPITAL_COMMUNITY)
Admission: RE | Admit: 2022-11-21 | Discharge: 2022-11-21 | Disposition: A | Discharge status: Home / Self Care | Payer: 59 | Source: Ambulatory Visit | Attending: Family Medicine | Admitting: Family Medicine

## 2022-11-21 DIAGNOSIS — N6320 Unspecified lump in the left breast, unspecified quadrant: Secondary | ICD-10-CM

## 2022-11-21 DIAGNOSIS — R92323 Mammographic fibroglandular density, bilateral breasts: Secondary | ICD-10-CM | POA: Diagnosis not present

## 2022-11-21 DIAGNOSIS — R928 Other abnormal and inconclusive findings on diagnostic imaging of breast: Secondary | ICD-10-CM | POA: Diagnosis not present

## 2022-11-21 DIAGNOSIS — R9230 Dense breasts, unspecified: Secondary | ICD-10-CM | POA: Insufficient documentation

## 2022-11-22 DIAGNOSIS — G4730 Sleep apnea, unspecified: Secondary | ICD-10-CM | POA: Diagnosis not present

## 2022-12-05 DIAGNOSIS — R0609 Other forms of dyspnea: Secondary | ICD-10-CM | POA: Diagnosis not present

## 2022-12-07 ENCOUNTER — Other Ambulatory Visit: Payer: Self-pay | Admitting: Family Medicine

## 2022-12-10 ENCOUNTER — Other Ambulatory Visit: Payer: Self-pay | Admitting: Family Medicine

## 2022-12-15 ENCOUNTER — Other Ambulatory Visit: Payer: Self-pay | Admitting: Pulmonary Disease

## 2022-12-19 ENCOUNTER — Ambulatory Visit (INDEPENDENT_AMBULATORY_CARE_PROVIDER_SITE_OTHER): Payer: 59 | Admitting: Family Medicine

## 2022-12-19 ENCOUNTER — Encounter: Payer: Self-pay | Admitting: Family Medicine

## 2022-12-19 VITALS — BP 129/85 | HR 83 | Ht 64.0 in | Wt 283.0 lb

## 2022-12-19 DIAGNOSIS — I10 Essential (primary) hypertension: Secondary | ICD-10-CM | POA: Diagnosis not present

## 2022-12-19 DIAGNOSIS — K219 Gastro-esophageal reflux disease without esophagitis: Secondary | ICD-10-CM

## 2022-12-19 DIAGNOSIS — Z23 Encounter for immunization: Secondary | ICD-10-CM

## 2022-12-19 DIAGNOSIS — R52 Pain, unspecified: Secondary | ICD-10-CM | POA: Diagnosis not present

## 2022-12-19 DIAGNOSIS — E1165 Type 2 diabetes mellitus with hyperglycemia: Secondary | ICD-10-CM | POA: Diagnosis not present

## 2022-12-19 DIAGNOSIS — E782 Mixed hyperlipidemia: Secondary | ICD-10-CM

## 2022-12-19 DIAGNOSIS — E785 Hyperlipidemia, unspecified: Secondary | ICD-10-CM

## 2022-12-19 NOTE — Patient Instructions (Addendum)
F/u in 11 weeks, call if you need me sooner  I will message  re equipment for CPAP, [facility] has sent request  for 2 weeks Want to discuss results of recent sleep study  Lipid, cmp and EGFr, alternate lipid lowering medication to be started  CONGRATS on improved blood sugar and  weight losss.  Nurse pls determine if pt needs TdaP and let her know  Thanks for choosing [facility], we consider it a privelige to serve you.

## 2022-12-20 LAB — CMP14+EGFR
ALT: 37 IU/L — ABNORMAL HIGH (ref 0–32)
AST: 49 IU/L — ABNORMAL HIGH (ref 0–40)
Albumin: 4.4 g/dL (ref 3.9–4.9)
Alkaline Phosphatase: 38 IU/L — ABNORMAL LOW (ref 44–121)
BUN/Creatinine Ratio: 21 (ref 12–28)
BUN: 20 mg/dL (ref 8–27)
Bilirubin Total: 0.3 mg/dL (ref 0.0–1.2)
CO2: 25 mmol/L (ref 20–29)
Calcium: 10.2 mg/dL (ref 8.7–10.3)
Chloride: 103 mmol/L (ref 96–106)
Creatinine, Ser: 0.96 mg/dL (ref 0.57–1.00)
Globulin, Total: 3.1 g/dL (ref 1.5–4.5)
Glucose: 132 mg/dL — ABNORMAL HIGH (ref 70–99)
Potassium: 4.4 mmol/L (ref 3.5–5.2)
Sodium: 140 mmol/L (ref 134–144)
Total Protein: 7.5 g/dL (ref 6.0–8.5)
eGFR: 67 mL/min/{1.73_m2} (ref >59)

## 2022-12-20 LAB — LIPID PANEL
Chol/HDL Ratio: 3.1 ratio (ref 0.0–4.4)
Cholesterol, Total: 118 mg/dL (ref 100–199)
HDL: 38 mg/dL — ABNORMAL LOW (ref >39)
LDL Chol Calc (NIH): 58 mg/dL (ref 0–99)
Triglycerides: 119 mg/dL (ref 0–149)
VLDL Cholesterol Cal: 22 mg/dL (ref 5–40)

## 2022-12-21 ENCOUNTER — Telehealth (HOSPITAL_BASED_OUTPATIENT_CLINIC_OR_DEPARTMENT_OTHER): Payer: Self-pay | Admitting: Pulmonary Disease

## 2022-12-21 NOTE — Telephone Encounter (Signed)
As per DPR Detailed message left on identifiable machine belonging to the patient. Advised to call with any questions or concerns.

## 2022-12-21 NOTE — Telephone Encounter (Signed)
ONO on CPAP/room air did not show significant desaturation. She does not need oxygen

## 2022-12-24 ENCOUNTER — Encounter: Payer: Self-pay | Admitting: Family Medicine

## 2022-12-24 MED ORDER — REPATHA 140 MG/ML ~~LOC~~ SOSY: 140.0000 mg | PREFILLED_SYRINGE | SUBCUTANEOUS | 11 refills | Status: DC

## 2022-12-24 NOTE — Assessment & Plan Note (Signed)
The patient's Controlled Substance registry is reviewed and compliance confirmed. Adequacy of  Pain control and level of function is assessed. Medication dosing is adjusted as deemed appropriate. Twelve weeks of medication is prescribed , with a follow up appointment between 11 to 12 weeks .  

## 2022-12-24 NOTE — Assessment & Plan Note (Signed)
Hyperlipidemia:Low fat diet discussed and encouraged.   Lipid Panel  Lab Results  Component Value Date   CHOL 118 12/19/2022   HDL 38 (L) 12/19/2022   LDLCALC 58 12/19/2022   TRIG 119 12/19/2022   CHOLHDL 3.1 12/19/2022     Change to repatha as liver enzymes are elevated on low dose crestor

## 2022-12-24 NOTE — Assessment & Plan Note (Signed)
Ms. Krummel is reminded of the importance of commitment to daily physical activity for 30 minutes or more, as able and the need to limit carbohydrate intake to 30 to 60 grams per meal to help with blood sugar control.   The need to take medication as prescribed, test blood sugar as directed, and to call between visits if there is a concern that blood sugar is uncontrolled is also discussed.   Ms. Lipsey is reminded of the importance of daily foot exam, annual eye examination, and good blood sugar, blood pressure and cholesterol control.     Latest Ref Rng & Units 12/19/2022   11:21 AM 09/20/2022   11:43 AM 09/04/2022   11:56 AM 08/31/2022    8:21 AM 06/20/2022   11:56 AM  Diabetic Labs  HbA1c 4.0 - 5.6 %   7.8     Chol 100 - 199 mg/dL 161  096    045   HDL >40 mg/dL 38  34    34   Calc LDL 0 - 99 mg/dL 58  981    191   Triglycerides 0 - 149 mg/dL 478  295    621   Creatinine 0.57 - 1.00 mg/dL 3.08    6.57  8.46       12/19/2022   10:17 AM 11/08/2022    8:35 AM 09/20/2022   10:26 AM 09/14/2022    4:07 PM 09/04/2022   11:37 AM 06/20/2022   11:22 AM 06/20/2022   10:56 AM  BP/Weight  Systolic BP 129 146 124 137 118 128 152  Diastolic BP 85 77 79 77 84 82 96  Wt. (Lbs) 283.04  291 297.6 291.7    BMI 48.58 kg/m2  49.95 kg/m2 51.08 kg/m2 50.07 kg/m2        Latest Ref Rng & Units 05/10/2022   12:00 AM 01/19/2021   12:00 AM  Foot/eye exam completion dates  Eye Exam No Retinopathy No Retinopathy  No Retinopathy         This result is from an external source.      Improving, managed by Endo

## 2022-12-24 NOTE — Assessment & Plan Note (Signed)
Controlled, no change in medication  

## 2022-12-24 NOTE — Progress Notes (Signed)
Angela [name]     MRN: [id number]      DOB: Feb 27, 1961  Chief Complaint  Patient presents with   Diabetes    F/u diabetes    HPI Angela [name] is here for follow up and re-evaluation of chronic medical conditions, medication management and review of any available recent lab and radiology data.  Preventive health is updated, specifically  Cancer screening and Immunization.   Questions or concerns regarding consultations or procedures which the PT has had in the interim are  addressed.Still waiting on CPAP equipment  The PT denies any adverse reactions to current medications since the last visit.  Blood sugar is improved and she is losing weight so very happy about both ROS Denies recent fever or chills. Denies sinus pressure, nasal congestion, ear pain or sore throat. Denies chest congestion, productive cough or wheezing. Denies chest pains, palpitations and leg swelling Denies abdominal pain, nausea, vomiting,diarrhea or constipation.   Denies dysuria, frequency, hesitancy or incontinence. Denies  uncontrolled joint pain, swelling and limitation in mobility. Denies headaches, seizures, numbness, or tingling. Denies depression, anxiety or insomnia. Denies skin break down or rash.   PE  BP 129/85 (BP Location: Right Arm, Patient Position: Sitting)   Pulse 83   Ht 5\' 4"  (1.626 m)   Wt 283 lb 0.6 oz (128.4 kg)   SpO2 92%   BMI 48.58 kg/m   Patient alert and oriented and in no cardiopulmonary distress.  HEENT: No facial asymmetry, EOMI,     Neck supple .  Chest: Clear to auscultation bilaterally.  CVS: S1, S2 no murmurs, no S3.Regular rate.  ABD: Soft non tender.   Ext: No edema  MS: decreased  ROM spine, shoulders, hips and knees.  Skin: Intact, no ulcerations or rash noted.  Psych: Good eye contact, normal affect. Memory intact not anxious or depressed appearing.  CNS: CN 2-12 intact, power,  normal throughout.no focal deficits noted.   Assessment &  Plan  Essential hypertension, benign Controlled, no change in medication DASH diet and commitment to daily physical activity for a minimum of 30 minutes discussed and encouraged, as a part of hypertension management. The importance of attaining a healthy weight is also discussed.     12/19/2022   10:17 AM 11/08/2022    8:35 AM 09/20/2022   10:26 AM 09/14/2022    4:07 PM 09/04/2022   11:37 AM 06/20/2022   11:22 AM 06/20/2022   10:56 AM  BP/Weight  Systolic BP 129 146 124 137 118 128 152  Diastolic BP 85 77 79 77 84 82 96  Wt. (Lbs) 283.04  291 297.6 291.7    BMI 48.58 kg/m2  49.95 kg/m2 51.08 kg/m2 50.07 kg/m2         Encounter for pain management The patient's Controlled Substance registry is reviewed and compliance confirmed. Adequacy of  Pain control and level of function is assessed. Medication dosing is adjusted as deemed appropriate. Twelve weeks of medication is prescribed , with a follow up appointment between 11 to 12 weeks .   Hyperlipidemia LDL goal <100 Hyperlipidemia:Low fat diet discussed and encouraged.   Lipid Panel  Lab Results  Component Value Date   CHOL 118 12/19/2022   HDL 38 (L) 12/19/2022   LDLCALC 58 12/19/2022   TRIG 119 12/19/2022   CHOLHDL 3.1 12/19/2022     Change to repatha as liver enzymes are elevated on low dose crestor  GERD (gastroesophageal reflux disease) Controlled, no change in medication   Morbid obesity (  [facility])  Patient re-educated about  the importance of commitment to a  minimum of 150 minutes of exercise per week as able.  The importance of healthy food choices with portion control discussed, as well as eating regularly and within a 12 hour window most days. The need to choose "clean , green" food 50 to 75% of the time is discussed, as well as to make water the primary drink and set a goal of 64 ounces water daily.       12/19/2022   10:17 AM 09/20/2022   10:26 AM 09/14/2022    4:07 PM  Weight /BMI  Weight 283 lb 0.6 oz  291 lb 297 lb 9.6 oz  Height 5\' 4"  (1.626 m) 5\' 4"  (1.626 m) 5\' 4"  (1.626 m)  BMI 48.58 kg/m2 49.95 kg/m2 51.08 kg/m2      Uncontrolled type 2 diabetes mellitus with hyperglycemia (HCC) Angela [name] is reminded of the importance of commitment to daily physical activity for 30 minutes or more, as able and the need to limit carbohydrate intake to 30 to 60 grams per meal to help with blood sugar control.   The need to take medication as prescribed, test blood sugar as directed, and to call between visits if there is a concern that blood sugar is uncontrolled is also discussed.   Angela [name] is reminded of the importance of daily foot exam, annual eye examination, and good blood sugar, blood pressure and cholesterol control.     Latest Ref Rng & Units 12/19/2022   11:21 AM 09/20/2022   11:43 AM 09/04/2022   11:56 AM 08/31/2022    8:21 AM 06/20/2022   11:56 AM  Diabetic Labs  HbA1c 4.0 - 5.6 %   7.8     Chol 100 - 199 mg/dL 295  284    132   HDL >44 mg/dL 38  34    34   Calc LDL 0 - 99 mg/dL 58  010    272   Triglycerides 0 - 149 mg/dL 536  644    034   Creatinine 0.57 - 1.00 mg/dL 7.42    5.95  6.38       12/19/2022   10:17 AM 11/08/2022    8:35 AM 09/20/2022   10:26 AM 09/14/2022    4:07 PM 09/04/2022   11:37 AM 06/20/2022   11:22 AM 06/20/2022   10:56 AM  BP/Weight  Systolic BP 129 146 124 137 118 128 152  Diastolic BP 85 77 79 77 84 82 96  Wt. (Lbs) 283.04  291 297.6 291.7    BMI 48.58 kg/m2  49.95 kg/m2 51.08 kg/m2 50.07 kg/m2        Latest Ref Rng & Units 05/10/2022   12:00 AM 01/19/2021   12:00 AM  Foot/eye exam completion dates  Eye Exam No Retinopathy No Retinopathy  No Retinopathy         This result is from an external source.      Improving, managed by Endo

## 2022-12-24 NOTE — Assessment & Plan Note (Signed)
  Patient re-educated about  the importance of commitment to a  minimum of 150 minutes of exercise per week as able.  The importance of healthy food choices with portion control discussed, as well as eating regularly and within a 12 hour window most days. The need to choose "clean , green" food 50 to 75% of the time is discussed, as well as to make water the primary drink and set a goal of 64 ounces water daily.       12/19/2022   10:17 AM 09/20/2022   10:26 AM 09/14/2022    4:07 PM  Weight /BMI  Weight 283 lb 0.6 oz 291 lb 297 lb 9.6 oz  Height 5\' 4"  (1.626 m) 5\' 4"  (1.626 m) 5\' 4"  (1.626 m)  BMI 48.58 kg/m2 49.95 kg/m2 51.08 kg/m2

## 2022-12-24 NOTE — Progress Notes (Incomplete)
   Angela [name]     MRN: [id number]      DOB: Aug 18, 1960  Chief Complaint  Patient presents with  . Diabetes    F/u diabetes    HPI Angela [name] is here for follow up and re-evaluation of chronic medical conditions, medication management and review of any available recent lab and radiology data.  Preventive health is updated, specifically  Cancer screening and Immunization.   Questions or concerns regarding consultations or procedures which the PT has had in the interim are  addressed.Still waiting on CPAP equipment  The PT denies any adverse reactions to current medications since the last visit.  Blood sugar is improved and she is losing weight so very happy about both ROS Denies recent fever or chills. Denies sinus pressure, nasal congestion, ear pain or sore throat. Denies chest congestion, productive cough or wheezing. Denies chest pains, palpitations and leg swelling Denies abdominal pain, nausea, vomiting,diarrhea or constipation.   Denies dysuria, frequency, hesitancy or incontinence. Denies  uncontrolled joint pain, swelling and limitation in mobility. Denies headaches, seizures, numbness, or tingling. Denies depression, anxiety or insomnia. Denies skin break down or rash.   PE  BP 129/85 (BP Location: Right Arm, Patient Position: Sitting)   Pulse 83   Ht 5\' 4"  (1.626 m)   Wt 283 lb 0.6 oz (128.4 kg)   SpO2 92%   BMI 48.58 kg/m   Patient alert and oriented and in no cardiopulmonary distress.  HEENT: No facial asymmetry, EOMI,     Neck supple .  Chest: Clear to auscultation bilaterally.  CVS: S1, S2 no murmurs, no S3.Regular rate.  ABD: Soft non tender.   Ext: No edema  MS: decreased  ROM spine, shoulders, hips and knees.  Skin: Intact, no ulcerations or rash noted.  Psych: Good eye contact, normal affect. Memory intact not anxious or depressed appearing.  CNS: CN 2-12 intact, power,  normal throughout.no focal deficits noted.   Assessment & Plan  No  problem-specific Assessment & Plan notes found for this encounter.

## 2022-12-24 NOTE — Assessment & Plan Note (Signed)
Controlled, no change in medication DASH diet and commitment to daily physical activity for a minimum of 30 minutes discussed and encouraged, as a part of hypertension management. The importance of attaining a healthy weight is also discussed.     12/19/2022   10:17 AM 11/08/2022    8:35 AM 09/20/2022   10:26 AM 09/14/2022    4:07 PM 09/04/2022   11:37 AM 06/20/2022   11:22 AM 06/20/2022   10:56 AM  BP/Weight  Systolic BP 129 146 124 137 118 128 152  Diastolic BP 85 77 79 77 84 82 96  Wt. (Lbs) 283.04  291 297.6 291.7    BMI 48.58 kg/m2  49.95 kg/m2 51.08 kg/m2 50.07 kg/m2

## 2022-12-25 ENCOUNTER — Telehealth: Payer: Self-pay | Admitting: Pulmonary Disease

## 2022-12-25 MED ORDER — DIAZEPAM 5 MG PO TABS: ORAL_TABLET | ORAL | 5 refills | Status: DC

## 2022-12-25 MED ORDER — OXYCODONE HCL ER 40 MG PO T12A: 40.0000 mg | EXTENDED_RELEASE_TABLET | Freq: Two times a day (BID) | ORAL | 0 refills | Status: AC

## 2022-12-25 MED ORDER — FUROSEMIDE 40 MG PO TABS: 40.0000 mg | ORAL_TABLET | Freq: Every day | ORAL | 0 refills | Status: DC

## 2022-12-25 MED ORDER — OXYCODONE HCL ER 40 MG PO T12A: 40.0000 mg | EXTENDED_RELEASE_TABLET | Freq: Two times a day (BID) | ORAL | 0 refills | Status: DC

## 2022-12-25 NOTE — Telephone Encounter (Signed)
PT needs a refill of Lasix. Her # is [phone]  [facility] is [facility] in [place] on [address].

## 2022-12-25 NOTE — Telephone Encounter (Signed)
Refill sent.

## 2023-01-05 DIAGNOSIS — R0609 Other forms of dyspnea: Secondary | ICD-10-CM | POA: Diagnosis not present

## 2023-01-09 ENCOUNTER — Encounter: Payer: Self-pay | Admitting: Nurse Practitioner

## 2023-01-09 ENCOUNTER — Ambulatory Visit (INDEPENDENT_AMBULATORY_CARE_PROVIDER_SITE_OTHER): Payer: 59 | Admitting: Nurse Practitioner

## 2023-01-09 VITALS — BP 110/68 | HR 66 | Ht 64.0 in | Wt 277.8 lb

## 2023-01-09 DIAGNOSIS — Z794 Long term (current) use of insulin: Secondary | ICD-10-CM

## 2023-01-09 DIAGNOSIS — Z7984 Long term (current) use of oral hypoglycemic drugs: Secondary | ICD-10-CM | POA: Diagnosis not present

## 2023-01-09 DIAGNOSIS — I10 Essential (primary) hypertension: Secondary | ICD-10-CM | POA: Diagnosis not present

## 2023-01-09 DIAGNOSIS — Z7985 Long-term (current) use of injectable non-insulin antidiabetic drugs: Secondary | ICD-10-CM

## 2023-01-09 DIAGNOSIS — E782 Mixed hyperlipidemia: Secondary | ICD-10-CM | POA: Diagnosis not present

## 2023-01-09 DIAGNOSIS — E039 Hypothyroidism, unspecified: Secondary | ICD-10-CM

## 2023-01-09 DIAGNOSIS — E559 Vitamin D deficiency, unspecified: Secondary | ICD-10-CM | POA: Diagnosis not present

## 2023-01-09 DIAGNOSIS — E1159 Type 2 diabetes mellitus with other circulatory complications: Secondary | ICD-10-CM

## 2023-01-09 LAB — POCT GLYCOSYLATED HEMOGLOBIN (HGB A1C): Hemoglobin A1C: 7.4 % — AB (ref 4.0–5.6)

## 2023-01-09 MED ORDER — LEVOTHYROXINE SODIUM 75 MCG PO TABS: 75.0000 ug | ORAL_TABLET | Freq: Every day | ORAL | 3 refills | Status: DC

## 2023-01-09 MED ORDER — INSULIN LISPRO PROT & LISPRO (75-25 MIX) 100 UNIT/ML KWIKPEN
20.0000 [IU] | PEN_INJECTOR | Freq: Two times a day (BID) | SUBCUTANEOUS | 3 refills | Status: DC
Start: 2023-01-09 — End: 2023-05-18

## 2023-01-09 MED ORDER — TIRZEPATIDE 7.5 MG/0.5ML ~~LOC~~ SOAJ: 7.5000 mg | SUBCUTANEOUS | 1 refills | Status: DC

## 2023-01-09 MED ORDER — METFORMIN HCL 500 MG PO TABS: 500.0000 mg | ORAL_TABLET | Freq: Two times a day (BID) | ORAL | 3 refills | Status: DC

## 2023-01-09 MED ORDER — INSULIN LISPRO PROT & LISPRO (75-25 MIX) 100 UNIT/ML KWIKPEN
20.0000 [IU] | PEN_INJECTOR | Freq: Two times a day (BID) | SUBCUTANEOUS | 3 refills | Status: DC
Start: 2023-01-09 — End: 2023-01-09

## 2023-01-09 NOTE — Progress Notes (Signed)
01/09/2023, 11:33 AM                    Endocrinology follow-up note   Subjective:    [name], female    DOB: 06-Mar-1961.  Angela Eichelberger is being seen in follow-up in the management of currently uncontrolled symptomatic type 2 diabetes.  She also has comorbid dyslipidemia, hypertension.   PMD:  Kerri Perches, MD.   Past Medical History:  Diagnosis Date   Allergy    Phreesia 06/02/2020   Anxiety    Arthritis    Asthma    Asthma    Phreesia 04/12/2020   Breast mass    left (monitor)   CHF (congestive heart failure) (HCC)    Phreesia 04/12/2020   Coronary atherosclerosis of native coronary artery    Mild nonobstructive CAD by cardiac catheterization 2008 - Dr. Sharyn Lull   Degenerative disc disease    Depression    Depression    Phreesia 04/12/2020   Diabetes mellitus without complication (HCC)    Phreesia 04/12/2020   Dysrhythmia    Essential hypertension, benign    Folliculitis 01/21/2015   GERD (gastroesophageal reflux disease)    H/O hiatal hernia    Helicobacter pylori gastritis 2008   Hypercholesterolemia    Hypertension    Hyperthyroidism    s/p radiation   Left-sided epistaxis 05/15/2018   Low back pain    Migraine    Nephrolithiasis    Recurring episodes since 2004   Neuromuscular disorder (HCC)    Phreesia 04/12/2020   Severe obesity (BMI >= 40) (HCC)    Sleep apnea    STOP BANG SCORE 6no cpap used   Thyroid disease    Phreesia 04/12/2020   Type 2 diabetes mellitus with diabetic neuropathy (HCC)     Past Surgical History:  Procedure Laterality Date   ABDOMINAL HYSTERECTOMY     BACK SURGERY  1993   BACK SURGERY  1994   BACK SURGERY  2002   BACK SURGERY  2011   [facility]    CARPAL TUNNEL RELEASE Left    CHOLECYSTECTOMY  1996   COLONOSCOPY  2000 [facility]   INT HEMORRHOIDS/FISSURE   COLONOSCOPY  2003 [facility], CHANGE IN BOWEL HABITS   INT HEMORRHOIDS   COLONOSCOPY  2006 [facility]   INT HEMORRHOIDS    COLONOSCOPY  2007 [facility]   INT HEMORRHOIDS   CYSTOSCOPY WITH STENT PLACEMENT Left 07/25/2012   Procedure: CYSTOSCOPY, left retograde pyelogram WITH left ureteral  STENT PLACEMENT;  Surgeon: Garnett Farm, MD;  Location: [facility];  Service: Urology;  Laterality: Left;   CYSTOSCOPY/RETROGRADE/URETEROSCOPY  12/04/2011   Procedure: CYSTOSCOPY/RETROGRADE/URETEROSCOPY;  Surgeon: Milford Cage, MD;  Location: [facility];  Service: Urology;  Laterality: Right;   CYSTOSCOPY/RETROGRADE/URETEROSCOPY/STONE EXTRACTION WITH BASKET Left 09/03/2012   Procedure: CYSTOSCOPY/RETROGRADE pyelogram/digital URETEROSCOPY/STONE EXTRACTION WITH BASKET, left stent removal;  Surgeon: Milford Cage, MD;  Location: [facility];  Service: Urology;  Laterality: Left;   ESOPHAGOGASTRODUODENOSCOPY  2008   [facility]:NUUVO hiatal hernia./Normal esophagus without evidence of Barrett's mass, stricture, erosion or ulceration./Normal duodenal bulb and second portion of the duodenum./Diffuse erythema in the body and the antrum with occasional erosion.  Biopsies obtained via cold forceps to evaluate for H. pylori gastritis   FRACTURE SURGERY  1999   right clavicle   HOLMIUM LASER APPLICATION Left 09/03/2012   Procedure: HOLMIUM LASER APPLICATION;  Surgeon: Milford Cage, MD;  Location: [facility];  Service: Urology;  Laterality: Left;   KNEE ARTHROSCOPY  10/04/2010,    right knee arthroscopy, dr Thomasena Edis   LEFT HEART CATH AND CORONARY ANGIOGRAPHY N/A 05/08/2018   Procedure: LEFT HEART CATH AND CORONARY ANGIOGRAPHY;  Surgeon: Lennette Bihari, MD;  Location: [facility];  Service: Cardiovascular;  Laterality: N/A;   left knee arthroscopic surgery  1999   Left salphingectomy secondary to ectopic pregnancy  1991   PARTIAL HYSTERECTOMY  1991   RIGHT HEART CATH N/A 07/07/2019   Procedure: RIGHT HEART CATH;  Surgeon: Marykay Lex, MD;  Location: [facility] INVASIVE CV LAB;  Service: Cardiovascular;  Laterality: N/A;   rotary cuff  Right  05/14/2014   [facility]   SPINE SURGERY N/A    Phreesia 06/02/2020   UPPER GASTROINTESTINAL ENDOSCOPY  2008 abd pain   H. pylori gastritis   UPPER GASTROINTESTINAL ENDOSCOPY  1996 [place] [place]   [facility]    Social History   Socioeconomic History   Marital status: Single    Spouse name: Not on file   Number of children: 1   Years of education: 12   Highest education level: 12th grade  Occupational History   Occupation: Disable   Tobacco Use   Smoking status: Never   Smokeless tobacco: Never  Vaping Use   Vaping status: Never Used  Substance and Sexual Activity   Alcohol use: No   Drug use: No   Sexual activity: Not Currently    Partners: Male    Birth control/protection: Surgical    Comment: hyst  Other Topics Concern   Not on file  Social History Narrative   Lives alone    Social Determinants of Health   Financial Resource Strain: Low Risk  (09/19/2022)   Overall Financial Resource Strain (CARDIA)    Difficulty of Paying Living Expenses: Not hard at all  Food Insecurity: No Food Insecurity (09/19/2022)   Hunger Vital Sign    Worried About Running Out of Food in the Last Year: Never true    Ran Out of Food in the Last Year: Never true  Transportation Needs: No Transportation Needs (09/19/2022)   PRAPARE - Administrator, Civil Service (Medical): No    Lack of Transportation (Non-Medical): No  Physical Activity: Insufficiently Active (09/19/2022)   Exercise Vital Sign    Days of Exercise per Week: 2 days    Minutes of Exercise per Session: 30 min  Stress: No Stress Concern Present (09/19/2022)   [employer] of Occupational Health - Occupational Stress Questionnaire    Feeling of Stress : Only a little  Social Connections: Unknown (09/19/2022)   Social Connection and Isolation Panel [NHANES]    Frequency of Communication with Friends and Family: More than three times a week    Frequency of Social Gatherings with Friends and Family: More than three times a  week    Attends Religious Services: Patient declined    Database administrator or Organizations: Yes    Attends Engineer, structural: More than 4 times per year    Marital Status: Divorced    Family History  Problem Relation Age of Onset   Heart disease Mother    Hypertension Mother    Cancer Mother        Cervical    Asthma Mother    Heart attack Father    Cancer Father        Prostate   Colon cancer Father        DECEASED AGE  61   Colon polyps Neg Hx     Outpatient Encounter Medications as of 01/09/2023  Medication Sig   albuterol (VENTOLIN HFA) 108 (90 Base) MCG/ACT inhaler TAKE 2 PUFFS BY MOUTH EVERY 6 HOURS AS NEEDED FOR WHEEZE OR SHORTNESS OF BREATH   amLODipine (NORVASC) 10 MG tablet TAKE 1 TABLET BY MOUTH EVERY DAY   B-D UF III MINI PEN NEEDLES 31G X 5 MM MISC Use to inject insulin twice daily   betamethasone, augmented, (DIPROLENE) 0.05 % lotion Apply 1 application. topically 2 (two) times daily.   blood glucose meter kit and supplies Dispense based on patient and insurance preference. Once daily testing dx. e11.9   calcipotriene (DOVONOX) 0.005 % ointment Apply 1 application topically daily.    clobetasol cream (TEMOVATE) 0.05 % Apply 1 application. topically 2 (two) times daily.   cloNIDine (CATAPRES - DOSED IN MG/24 HR) 0.3 mg/24hr patch Apply one patch to skin once weekly   cloNIDine (CATAPRES) 0.3 MG tablet TAKE 1 TABLET (0.3 MG TOTAL) BY MOUTH AT BEDTIME.   cyclobenzaprine (FLEXERIL) 10 MG tablet TAKE 1 TABLET BY MOUTH THREE TIMES A DAY   diazepam (VALIUM) 5 MG tablet Take one tablet by mouth three times daily for back spasm   DULoxetine (CYMBALTA) 60 MG capsule Take 1 capsule (60 mg total) by mouth 2 (two) times daily.   ergocalciferol (VITAMIN D2) 1.25 MG (50000 UT) capsule Take 1 capsule (50,000 Units total) by mouth once a week. One capsule once weekly   esomeprazole (NEXIUM) 40 MG capsule TAKE 1 CAPSULE BY MOUTH EVERY DAY   fenofibrate (TRICOR) 145 MG  tablet TAKE 1 TABLET BY MOUTH EVERY DAY   furosemide (LASIX) 40 MG tablet Take 1 tablet (40 mg total) by mouth daily.   gabapentin (NEURONTIN) 800 MG tablet Take one tablet by mouth four times daily for pain   hydrocortisone (PROCTOSOL HC) 2.5 % rectal cream    ipratropium-albuterol (DUONEB) 0.5-2.5 (3) MG/3ML SOLN Take 3 mLs by nebulization every 6 (six) hours as needed.   Lancets (ONETOUCH DELICA PLUS LANCET33G) MISC USE TO TEST ONCE DAILY   loratadine (CLARITIN) 10 MG tablet TAKE 1 TABLET BY MOUTH EVERY DAY   metoprolol tartrate (LOPRESSOR) 25 MG tablet Take 1 tablet (25 mg total) by mouth 2 (two) times daily.   naloxone (NARCAN) nasal spray 4 mg/0.1 mL One spray into each nostril once as needed   nitroGLYCERIN (NITROSTAT) 0.4 MG SL tablet Place 1 tablet (0.4 mg total) under the tongue every 5 (five) minutes as needed for chest pain.   ONETOUCH VERIO test strip USE AS INSTRUCTED FOUR TIMES DAILY TESTING DX E11.65   oxyCODONE (OXYCONTIN) 40 mg 12 hr tablet Take 1 tablet (40 mg total) by mouth every 12 (twelve) hours.   oxyCODONE (OXYCONTIN) 40 mg 12 hr tablet Take 1 tablet (40 mg total) by mouth every 12 (twelve) hours.   [START ON 02/01/2023] oxyCODONE (OXYCONTIN) 40 mg 12 hr tablet Take 1 tablet (40 mg total) by mouth every 12 (twelve) hours.   [START ON 03/04/2023] oxyCODONE (OXYCONTIN) 40 mg 12 hr tablet Take 1 tablet (40 mg total) by mouth every 12 (twelve) hours.   potassium chloride SA (KLOR-CON M20) 20 MEQ tablet Take 1 tablet (20 mEq total) by mouth 2 (two) times daily.   spironolactone (ALDACTONE) 25 MG tablet TAKE 1 TABLET (25 MG TOTAL) BY MOUTH DAILY.   SYMBICORT 160-4.5 MCG/ACT inhaler Inhale 2 puffs into the lungs in the morning and at bedtime.  tirzepatide (MOUNJARO) 7.5 MG/0.5ML Pen Inject 7.5 mg into the skin once a week.   trimethoprim-polymyxin b (POLYTRIM) ophthalmic solution PLACE 1 DROP INTO BOTH EYES EVERY 4 (FOUR) HOURS FOR 7 DAYS. MAX 4 DOSES PER DAY.   UNABLE TO FIND BD  Ultra-Fine Mini Pen Needle 31 gauge x 3/16"  USE AS DIRECTED TWICE A DAY   UNABLE TO FIND OneTouch Verio test strips  USE AS INSTRUCTED FOUR TIMES DAILY TESTING DX E11.65   UNABLE TO FIND OneTouch Delica Plus Lancet 33 gauge  USE TO TEST ONCE DAILY   UNABLE TO FIND Med Name:  Nutritional supplement 225 units/m or PRN Incontinence pads   264/m or PRN Underpads/Chux     90/m or PRN Nonsterile gloves  1B/M OR PRN   UNABLE TO FIND Med Name: Step stool with handle   UNABLE TO FIND Med Name:  BEDSIDE STEP STOOL WITH HANDLE   zolpidem (AMBIEN) 10 MG tablet TAKE 1 TABLET BY MOUTH EVERYDAY AT BEDTIME   [DISCONTINUED] Insulin Lispro Prot & Lispro (HUMALOG 75/25 MIX) (75-25) 100 UNIT/ML Kwikpen Inject 40 Units into the skin in the morning and at bedtime.   [DISCONTINUED] levothyroxine (SYNTHROID) 75 MCG tablet Take 1 tablet (75 mcg total) by mouth daily.   [DISCONTINUED] metFORMIN (GLUCOPHAGE) 500 MG tablet Take 1 tablet (500 mg total) by mouth 2 (two) times daily with a meal.   [DISCONTINUED] tirzepatide (MOUNJARO) 5 MG/0.5ML Pen Inject 5 mg into the skin once a week.   Insulin Lispro Prot & Lispro (HUMALOG 75/25 MIX) (75-25) 100 UNIT/ML Kwikpen Inject 20 Units into the skin in the morning and at bedtime.   levothyroxine (SYNTHROID) 75 MCG tablet Take 1 tablet (75 mcg total) by mouth daily.   metFORMIN (GLUCOPHAGE) 500 MG tablet Take 1 tablet (500 mg total) by mouth 2 (two) times daily with a meal.   [DISCONTINUED] Evolocumab (REPATHA) 140 MG/ML SOSY Inject 140 mg into the skin every 14 (fourteen) days. (Patient not taking: Reported on 01/09/2023)   [DISCONTINUED] ezetimibe (ZETIA) 10 MG tablet Take 1 tablet (10 mg total) by mouth daily.   [DISCONTINUED] Insulin Lispro Prot & Lispro (HUMALOG 75/25 MIX) (75-25) 100 UNIT/ML Kwikpen Inject 20 Units into the skin in the morning and at bedtime.   [DISCONTINUED] levothyroxine (SYNTHROID) 75 MCG tablet Take 1 tablet (75 mcg total) by mouth daily.    [DISCONTINUED] metFORMIN (GLUCOPHAGE) 500 MG tablet Take 1 tablet (500 mg total) by mouth 2 (two) times daily with a meal.   No facility-administered encounter medications on file as of 01/09/2023.    ALLERGIES: Allergies  Allergen Reactions   Latex Itching and Rash   Morphine Other (See Comments)    Reaction with esophagus, unable to swallow.    Morphine And Codeine Shortness Of Breath and Swelling   Other Itching, Shortness Of Breath and Swelling   Ace Inhibitors Cough   Aspirin Other (See Comments)    Reaction with esophagus, unable to swallow.    Losartan Cough   Statins Other (See Comments)    Elevation of liver enzymes   Tapentadol Other (See Comments)    Nausea, increased sleepiness, h/a   Adhesive [Tape] Rash   Tomato Other (See Comments)    Acid reflux due to acid in tomato    VACCINATION STATUS: Immunization History  Administered Date(s) Administered   Influenza Split 03/30/2011   Influenza Whole 03/21/2005, 02/09/2006   Influenza,inj,Quad PF,6+ Mos 02/11/2014, 07/27/2015, 03/09/2016, 01/29/2017, 02/07/2018, 01/30/2019, 02/11/2020, 03/03/2021, 12/29/2021   Influenza-Unspecified 01/29/2017  PFIZER(Purple Top)SARS-COV-2 Vaccination 07/22/2019, 08/12/2019, 03/05/2020, 11/19/2020   Pneumococcal Polysaccharide-23 04/17/2006, 09/14/2015   Td 04/17/2006   Tdap 12/19/2022   Zoster Recombinant(Shingrix) 09/22/2022    Diabetes She presents for her follow-up diabetic visit. She has type 2 diabetes mellitus. Her disease course has been improving. There are no hypoglycemic associated symptoms. Pertinent negatives for hypoglycemia include no confusion, headaches, pallor or seizures. Associated symptoms include fatigue and weight loss. Pertinent negatives for diabetes include no blurred vision, no chest pain, no polydipsia, no polyphagia and no polyuria. There are no hypoglycemic complications. Symptoms are improving. Diabetic complications include heart disease, nephropathy and  peripheral neuropathy. Risk factors for coronary artery disease include dyslipidemia, diabetes mellitus, family history, hypertension, obesity, sedentary lifestyle, tobacco exposure and post-menopausal. Current diabetic treatment includes insulin injections and oral agent (monotherapy) (and mounjaro). She is compliant with treatment most of the time. Her weight is decreasing steadily. She is following a generally healthy diet. When asked about meal planning, she reported none. She has had a previous visit with a dietitian. She never participates in exercise. Her home blood glucose trend is decreasing steadily. Her overall blood glucose range is 140-180 mg/dl. (She presents today, accompanied by her daughter, with her meter and logs showing improved glycemic profile overall.  Her POCT A1c today is 7.4%, improving from last visit of 7.8%.  She has missed several doses of insulin due to fear of hypoglycemia.  She has lost 14 lbs since last visit and notes she has more energy, generally feels much better!  Analysis of her meter shows 7-day average of 164, 14-day average of 152, 30-day average of 149, 90-day average of 153.) An ACE inhibitor/angiotensin II receptor blocker is contraindicated. She does not see a podiatrist.Eye exam is not current.  Hyperlipidemia This is a chronic problem. The current episode started more than 1 year ago. The problem is controlled. Recent lipid tests were reviewed and are normal. Exacerbating diseases include chronic renal disease, diabetes, hypothyroidism and obesity. Factors aggravating her hyperlipidemia include beta blockers and thiazides. Pertinent negatives include no chest pain, myalgias or shortness of breath. Current antihyperlipidemic treatment includes statins. The current treatment provides mild improvement of lipids. Compliance problems include adherence to diet and adherence to exercise.  Risk factors for coronary artery disease include diabetes mellitus, dyslipidemia,  hypertension, obesity and a sedentary lifestyle.  Hypertension This is a chronic problem. The current episode started more than 1 year ago. The problem has been gradually improving since onset. The problem is controlled. Associated symptoms include peripheral edema. Pertinent negatives include no blurred vision, chest pain, headaches, palpitations or shortness of breath. Agents associated with hypertension include thyroid hormones. Risk factors for coronary artery disease include family history, dyslipidemia, diabetes mellitus, obesity, sedentary lifestyle and post-menopausal state. Past treatments include calcium channel blockers, diuretics and beta blockers. The current treatment provides moderate improvement. Compliance problems include diet and exercise.  Hypertensive end-organ damage includes kidney disease, CAD/MI and heart failure. Identifiable causes of hypertension include chronic renal disease and sleep apnea.    Review of systems  Constitutional: +steadily decreasing body weight,  current Body mass index is 47.68 kg/m. , + fatigue, no subjective hyperthermia, no subjective hypothermia Eyes: no blurry vision, no xerophthalmia ENT: no sore throat, no nodules palpated in throat, no dysphagia/odynophagia, no hoarseness Cardiovascular: no chest pain, no shortness of breath, no palpitations, no leg swelling Respiratory: no cough, no SOB Gastrointestinal: no nausea/vomiting/diarrhea Musculoskeletal: no muscle/joint aches, walks with cane d/t hx of falls Skin: no rashes,  no hyperemia Neurological: no tremors, no numbness, no tingling, no dizziness Psychiatric: no depression, no anxiety,    Objective:    BP 110/68 (BP Location: Left Arm, Patient Position: Sitting, Cuff Size: Large)   Pulse 66   Ht 5\' 4"  (1.626 m)   Wt 277 lb 12.8 oz (126 kg)   BMI 47.68 kg/m   Wt Readings from Last 3 Encounters:  01/09/23 277 lb 12.8 oz (126 kg)  12/19/22 283 lb 0.6 oz (128.4 kg)  09/20/22 291 lb  (132 kg)    BP Readings from Last 3 Encounters:  01/09/23 110/68  12/19/22 129/85  11/08/22 (!) 146/77     Physical Exam- Limited  Constitutional:  Body mass index is 47.68 kg/m. , not in acute distress, normal state of mind Eyes:  EOMI, no exophthalmos Musculoskeletal: no gross deformities, strength intact in all four extremities, no gross restriction of joint movements, walks with cane due to disequilibrium and hx of falls Skin:  no rashes, no hyperemia Neurological: no tremor with outstretched hands   CMP ( most recent) CMP     Component Value Date/Time   NA 140 12/19/2022 1121   K 4.4 12/19/2022 1121   CL 103 12/19/2022 1121   CO2 25 12/19/2022 1121   GLUCOSE 132 (H) 12/19/2022 1121   GLUCOSE 316 (H) 02/11/2020 1111   BUN 20 12/19/2022 1121   CREATININE 0.96 12/19/2022 1121   CREATININE 0.91 02/11/2020 1111   CALCIUM 10.2 12/19/2022 1121   PROT 7.5 12/19/2022 1121   ALBUMIN 4.4 12/19/2022 1121   AST 49 (H) 12/19/2022 1121   ALT 37 (H) 12/19/2022 1121   ALKPHOS 38 (L) 12/19/2022 1121   BILITOT 0.3 12/19/2022 1121   GFRNONAA 70 05/06/2020 1201   GFRNONAA 69 02/11/2020 1111   GFRAA 80 05/06/2020 1201   GFRAA 80 02/11/2020 1111    Diabetic Labs (most recent): Lab Results  Component Value Date   HGBA1C 7.4 (A) 01/09/2023   HGBA1C 7.8 (A) 09/04/2022   HGBA1C 10.4 (A) 06/06/2022   MICROALBUR 101.9 (H) 11/10/2019   MICROALBUR 2.9 10/08/2017   MICROALBUR 33.7 (H) 07/06/2017     Lipid Panel ( most recent) Lipid Panel     Component Value Date/Time   CHOL 118 12/19/2022 1121   TRIG 119 12/19/2022 1121   HDL 38 (L) 12/19/2022 1121   CHOLHDL 3.1 12/19/2022 1121   CHOLHDL 4.0 11/10/2019 1303   VLDL 16 05/06/2018 0335   LDLCALC 58 12/19/2022 1121   LDLCALC 97 11/10/2019 1303      Assessment & Plan:   1) Uncontrolled type 2 diabetes mellitus with hyperglycemia (HCC)  - Angela Dassow has currently uncontrolled symptomatic type 2 DM since  62 years of  age.  She presents today, accompanied by her daughter, with her meter and logs showing improved glycemic profile overall.  Her POCT A1c today is 7.4%, improving from last visit of 7.8%.  She has missed several doses of insulin due to fear of hypoglycemia.  She has lost 14 lbs since last visit and notes she has more energy, generally feels much better!  Analysis of her meter shows 7-day average of 164, 14-day average of 152, 30-day average of 149, 90-day average of 153.   Recent labs reviewed.  -her diabetes is complicated by coronary artery disease, CHF, obesity/sedentary life and she remains at a high risk for more acute and chronic complications which include CAD, CVA, CKD, retinopathy, and neuropathy. These are all discussed in detail with her.  The following Lifestyle Medicine recommendations according to American College of Lifestyle Medicine [facility]) were discussed and offered to patient and she agrees to start the journey:  A. Whole Foods, Plant-based plate comprising of fruits and vegetables, plant-based proteins, whole-grain carbohydrates was discussed in detail with the patient.   A list for source of those nutrients were also provided to the patient.  Patient will use only water or unsweetened tea for hydration. B.  The need to stay away from risky substances including alcohol, smoking; obtaining 7 to 9 hours of restorative sleep, at least 150 minutes of moderate intensity exercise weekly, the importance of healthy social connections,  and stress reduction techniques were discussed. C.  A full color page of  Calorie density of various food groups per pound showing examples of each food groups was provided to the patient.  - Nutritional counseling repeated at each appointment due to patients tendency to fall back in to old habits.  - The patient admits there is a room for improvement in their diet and drink choices. -  Suggestion is made for the patient to avoid simple carbohydrates from  their diet including Cakes, Sweet Desserts / Pastries, Ice Cream, Soda (diet and regular), Sweet Tea, Candies, Chips, Cookies, Sweet Pastries, Store Bought Juices, Alcohol in Excess of 1-2 drinks a day, Artificial Sweeteners, Coffee Creamer, and "Sugar-free" Products. This will help patient to have stable blood glucose profile and potentially avoid unintended weight gain.   - I encouraged the patient to switch to unprocessed or minimally processed complex starch and increased protein intake (animal or plant source), fruits, and vegetables.   - Patient is advised to stick to a routine mealtimes to eat 3 meals a day and avoid unnecessary snacks (to snack only to correct hypoglycemia).  - I have approached her with the following individualized plan to manage diabetes and patient agrees:   -Given her presentation with chronic glycemic burden, she will likely require multiple daily injections of insulin in order for her to achieve control of diabetes to target.  -She is advised to continue her Humalog 75/25  but at lower dose of 20 units SQ twice daily with breakfast and supper to help prevent hypoglycemia, continue Metformin 500 mg po twice daily with meals.  Will increase her Mounjaro to 7.5 mg SQ weekly.   -She is encouraged to consistently monitor glucose 3 times daily, before injecting insulin and before bed, and to call the clinic if she has readings less than 70 or greater than 300 for 3 tests in a row.  She could not tolerate the Dexcom G7 (had allergic reaction to adhesive).  - she is warned not to take insulin without proper monitoring per orders.  2) Blood Pressure /Hypertension: Her blood pressure is controlled to target.  She is advised to continue Norvasc 10 mg po daily, Clonidine 0.3 mg patch once weekly, Clonidine 0.3 mg po daily at bedtime, Lasix 40 mg po daily, Spironolactone 25 mg po daily, and Metoprolol 25 mg po twice daily.  3) Lipids/Hyperlipidemia:  Her most recent lipid panel  from 12/19/22 shows controlled LDL at 58 (improving).  She could not tolerate the Lipitor due to elevated LFTs and could not tolerate the Repatha either  4)  Weight/Diet:  Her Body mass index is 47.68 kg/m. - clearly complicating her diabetes care.  She is a candidate for modest weight loss.  She has multiple comorbid situations complicating her chance of bariatric surgery.  I discussed with her the fact that  loss of 5 - 10% of her  current body weight will have the most impact on her diabetes management.  CDE Consult will be initiated . Exercise, and detailed carbohydrates information provided  -  detailed on discharge instructions.  5) Hypothyroidism:  There are no recent TFTs to review.  She is advised to continue Levothyroxine 75 mcg po daily before breakfast.  Will recheck TFTs prior to next visit and adjust dose accordingly.   - We discussed about the correct intake of her thyroid hormone, on empty stomach at fasting, with water, separated by at least 30 minutes from breakfast and other medications,  and separated by more than 4 hours from calcium, iron, multivitamins, acid reflux medications (PPIs). -Patient is made aware of the fact that thyroid hormone replacement is needed for life, dose to be adjusted by periodic monitoring of thyroid function tests.  6) Chronic Care/Health Maintenance: -she is not on ACE/ARB (intolerant to both) or statin medications (due to elevated LFTs) and is encouraged to initiate and continue to follow up with Ophthalmology, Dentist,  Podiatrist at least yearly or according to recommendations, and advised to  stay away from smoking. I have recommended yearly flu vaccine and pneumonia vaccine at least every 5 years; moderate intensity exercise for up to 150 minutes weekly; and  sleep for at least 7 hours a day.  - she is advised to maintain close follow up with Kerri Perches, MD for primary care needs, as well as her other providers for optimal and coordinated  care.     I spent  46  minutes in the care of the patient today including review of labs from CMP, Lipids, Thyroid Function, Hematology (current and previous including abstractions from other facilities); face-to-face time discussing  her blood glucose readings/logs, discussing hypoglycemia and hyperglycemia episodes and symptoms, medications doses, her options of short and long term treatment based on the latest standards of care / guidelines;  discussion about incorporating lifestyle medicine;  and documenting the encounter. Risk reduction counseling performed per USPSTF guidelines to reduce obesity and cardiovascular risk factors.     Please refer to Patient Instructions for Blood Glucose Monitoring and Insulin/Medications Dosing Guide"  in media tab for additional information. Please  also refer to " Patient Self Inventory" in the Media  tab for reviewed elements of pertinent patient history.  Angela Rothrock participated in the discussions, expressed understanding, and voiced agreement with the above plans.  All questions were answered to her satisfaction. she is encouraged to contact clinic should she have any questions or concerns prior to her return visit.   Follow up plan: - Return in about 4 months (around 05/11/2023) for Diabetes F/U with A1c in office, Thyroid follow up, Previsit labs, Bring meter and logs.   Ronny Bacon, [facility] [address] [place], [place] [address] Phone: [phone] Fax: [phone]   01/09/2023, 11:33 AM

## 2023-01-11 DIAGNOSIS — G4733 Obstructive sleep apnea (adult) (pediatric): Secondary | ICD-10-CM | POA: Diagnosis not present

## 2023-01-21 ENCOUNTER — Other Ambulatory Visit: Payer: Self-pay | Admitting: Family Medicine

## 2023-01-22 ENCOUNTER — Encounter: Payer: Self-pay | Admitting: Pulmonary Disease

## 2023-02-03 ENCOUNTER — Other Ambulatory Visit: Payer: Self-pay | Admitting: Internal Medicine

## 2023-02-03 ENCOUNTER — Other Ambulatory Visit: Payer: Self-pay | Admitting: Family Medicine

## 2023-02-03 DIAGNOSIS — I10 Essential (primary) hypertension: Secondary | ICD-10-CM

## 2023-02-04 DIAGNOSIS — R0609 Other forms of dyspnea: Secondary | ICD-10-CM | POA: Diagnosis not present

## 2023-02-06 ENCOUNTER — Encounter: Payer: Self-pay | Admitting: Internal Medicine

## 2023-02-06 ENCOUNTER — Ambulatory Visit: Payer: 59 | Attending: Internal Medicine | Admitting: Internal Medicine

## 2023-02-06 VITALS — BP 124/72 | HR 97 | Ht 64.0 in | Wt 273.2 lb

## 2023-02-06 DIAGNOSIS — G4733 Obstructive sleep apnea (adult) (pediatric): Secondary | ICD-10-CM | POA: Diagnosis not present

## 2023-02-06 DIAGNOSIS — I5032 Chronic diastolic (congestive) heart failure: Secondary | ICD-10-CM | POA: Diagnosis not present

## 2023-02-06 DIAGNOSIS — R0609 Other forms of dyspnea: Secondary | ICD-10-CM | POA: Diagnosis not present

## 2023-02-06 DIAGNOSIS — E1159 Type 2 diabetes mellitus with other circulatory complications: Secondary | ICD-10-CM | POA: Diagnosis not present

## 2023-02-06 DIAGNOSIS — I10 Essential (primary) hypertension: Secondary | ICD-10-CM

## 2023-02-06 DIAGNOSIS — E7800 Pure hypercholesterolemia, unspecified: Secondary | ICD-10-CM | POA: Diagnosis not present

## 2023-02-06 NOTE — Progress Notes (Signed)
Cardiology Office Note:  .   Date:  02/06/2023  ID:  Benetta Spar Kriesel, DOB 1961-01-30, MRN [id number] PCP: Kerri Perches, MD  [facility] Providers Cardiologist:  Parke Poisson, MD    History of Present Illness: Marland Kitchen   Angela [name] is a 62 y.o. female.  Discussed the use of AI scribe software for clinical note transcription with the patient, who gave verbal consent to proceed.  History of Present Illness   The patient, with a history of hypertension, hyperlipidemia, and diabetes, has been managing her conditions well. She has increased her activity levels and has lost significant weight. She has been experiencing leg cramps, which she attributes to her Lasix medication. Despite this, she has noticed an improvement in her lower extremity edema. The patient has also started a new cholesterol medication, Repatha, which she takes twice a month. This has helped control her cholesterol levels. The patient has also started a new diabetes medication, Tirzepatide Greggory Keen), which has helped control her appetite and contributed to her weight loss. The patient uses a CPAP machine at night and may need to add oxygen due to dropping oxygen levels.        ROS: negative except per HPI above.  Studies Reviewed: Marland Kitchen   EKG Interpretation Date/Time:  Tuesday February 06 2023 08:28:39 EDT Ventricular Rate:  76 PR Interval:  212 QRS Duration:  96 QT Interval:  406 QTC Calculation: 456 R Axis:   -6  Text Interpretation: Sinus rhythm with 1st degree A-V block Low voltage QRS Inferior infarct , age undetermined Cannot rule out Anteroseptal infarct , age undetermined Confirmed by Weston Brass ([id number]) on 02/06/2023 8:47:09 AM    Results   LABS LDL: 58 mg/dL Triglycerides: 191 mg/dL HDL: 38 mg/dL  DIAGNOSTIC Echocardiogram: moderate lvh, Grade 1 diastolic dysfunction (2023) EKG: Normal rhythm, first degree AV block, inferior and septal infarct pattern (02/06/2023)     Risk  Assessment/Calculations:             Physical Exam:   VS:  BP 124/72 (BP Location: Left Arm, Patient Position: Sitting, Cuff Size: Normal)   Pulse 97   Ht 5\' 4"  (1.626 m)   Wt 273 lb 3.2 oz (123.9 kg)   SpO2 95%   BMI 46.89 kg/m    Wt Readings from Last 3 Encounters:  02/06/23 273 lb 3.2 oz (123.9 kg)  01/09/23 277 lb 12.8 oz (126 kg)  12/19/22 283 lb 0.6 oz (128.4 kg)     Physical Exam   CHEST: lungs clear to auscultation EXTREMITIES: No edema observed     GEN: Well nourished, well developed in no acute distress NECK: No JVD; No carotid bruits CARDIAC: RRR, no murmurs, rubs, gallops RESPIRATORY:  Clear to auscultation without rales, wheezing or rhonchi  ABDOMEN: Soft, non-tender, non-distended EXTREMITIES:  No edema; No deformity   ASSESSMENT AND PLAN: .    1. Chronic diastolic heart failure (HCC)   2. DOE (dyspnea on exertion)   3. Hypercholesterolemia   4. Essential hypertension   5. Type 2 diabetes mellitus with vascular disease (HCC)   6. Morbid obesity (HCC)   7. OSA (obstructive sleep apnea)     Assessment and Plan    Diastolic Heart Failure Stable with improved activity level. No new or worsening shortness of breath. No lower extremity edema noted on examination. -Continue current regimen including Amlodipine 10mg  daily, Metoprolol 25mg  BID, and Spironolactone 25mg  daily. -Consider adjusting Lasix 40mg  daily as needed for swelling, but monitor closely for  changes in weight and breathing.  Hyperlipidemia LDL controlled at 58 with Repatha injections twice monthly, Zetia 10mg  daily, and Fenofibrate. -Continue current regimen.  Type 2 Diabetes Mellitus Significant weight loss and improved energy since starting Tirzepatide Greggory Keen). -Continue Tirzepatide as prescribed. -Continue monitoring blood glucose levels and adjust insulin as needed.  Leg Cramps Moderate cramping in calves, potentially related to Lasix use. -Consider taking Lasix as needed for  swelling, but monitor closely for changes in weight and breathing. -Continue Potassium supplement. -Consider adding Magnesium supplement to potentially help with cramping.  Sleep Apnea Using CPAP at night. -Continue CPAP use. -Follow up with pulmonologist regarding potential addition of oxygen at night due to reported drops in oxygen levels.  General Health Maintenance -Continue regular exercise regimen. -Continue low sodium diet. -Check labs with primary care provider next month. -Continue monitoring weight closely.                Total time of encounter: 30 minutes total time of encounter, including 20 minutes spent in face-to-face patient care on the date of this encounter. This time includes coordination of care and counseling regarding above mentioned problem list. Remainder of non-face-to-face time involved reviewing chart documents/testing relevant to the patient encounter and documentation in the medical record. I have independently reviewed documentation from referring provider.   Weston Brass, MD, [facility] [place]  [facility]

## 2023-02-06 NOTE — Patient Instructions (Signed)
Medication Instructions:  No changes *If you need a refill on your cardiac medications before your next appointment, please call your pharmacy*  Follow-Up: At [facility], you and your health needs are our priority.  As part of our continuing mission to provide you with exceptional heart care, we have created designated Provider Care Teams.  These Care Teams include your primary Cardiologist (physician) and Advanced Practice Providers (APPs -  Physician Assistants and Nurse Practitioners) who all work together to provide you with the care you need, when you need it.  We recommend signing up for the patient portal called "MyChart".  Sign up information is provided on this After Visit Summary.  MyChart is used to connect with patients for Virtual Visits (Telemedicine).  Patients are able to view lab/test results, encounter notes, upcoming appointments, etc.  Non-urgent messages can be sent to your provider as well.   To learn more about what you can do with MyChart, go to [email or web address].    Your next appointment:   1 year(s)  Provider:   Parke Poisson, MD

## 2023-02-11 ENCOUNTER — Other Ambulatory Visit: Payer: Self-pay | Admitting: Family Medicine

## 2023-02-13 ENCOUNTER — Telehealth: Payer: Self-pay | Admitting: Internal Medicine

## 2023-02-13 NOTE — Telephone Encounter (Signed)
*  STAT* If patient is at the pharmacy, call can be transferred to refill team.   1. Which medications need to be refilled? (please list name of each medication and dose if known)  metoprolol tartrate (LOPRESSOR) 25 MG tablet  2. Which pharmacy/location (including street and city if local pharmacy) is medication to be sent to? [id number] - [place], [place] - [address] [facility] [address]  3. Do they need a 30 day or 90 day supply?  90 day supply

## 2023-02-14 ENCOUNTER — Encounter: Payer: Self-pay | Admitting: Podiatry

## 2023-02-14 ENCOUNTER — Ambulatory Visit (INDEPENDENT_AMBULATORY_CARE_PROVIDER_SITE_OTHER): Payer: 59 | Admitting: Podiatry

## 2023-02-14 DIAGNOSIS — B351 Tinea unguium: Secondary | ICD-10-CM | POA: Diagnosis not present

## 2023-02-14 DIAGNOSIS — E1142 Type 2 diabetes mellitus with diabetic polyneuropathy: Secondary | ICD-10-CM

## 2023-02-14 DIAGNOSIS — M79674 Pain in right toe(s): Secondary | ICD-10-CM | POA: Diagnosis not present

## 2023-02-14 DIAGNOSIS — L84 Corns and callosities: Secondary | ICD-10-CM

## 2023-02-14 DIAGNOSIS — M79675 Pain in left toe(s): Secondary | ICD-10-CM

## 2023-02-14 DIAGNOSIS — Q828 Other specified congenital malformations of skin: Secondary | ICD-10-CM

## 2023-02-14 MED ORDER — METOPROLOL TARTRATE 25 MG PO TABS: 25.0000 mg | ORAL_TABLET | Freq: Two times a day (BID) | ORAL | 3 refills | Status: DC

## 2023-02-14 NOTE — Progress Notes (Signed)
  Subjective:  [name], female    DOB: Oct 13, 1960,  MRN: [id number]  62 y.o. female presents at risk foot care with history of diabetic neuropathy and painful porokeratotic lesion(s) right heel, callus left foot and painful mycotic toenails that limit ambulation. Painful toenails interfere with ambulation. Aggravating factors include wearing enclosed shoe gear. Pain is relieved with periodic professional debridement. Painful porokeratotic lesions are aggravated when weightbearing with and without shoegear. Pain is relieved with periodic professional debridement.  Chief Complaint  Patient presents with   Diabetes    [facility]- Pt has no problems at this time BS-131 A1C 7.4 01/09/24     New problem(s): None   PCP is Kerri Perches, MD , and last visit was December 19, 2022.  Allergies  Allergen Reactions   Latex Itching and Rash   Morphine Other (See Comments)    Reaction with esophagus, unable to swallow.    Morphine And Codeine Shortness Of Breath and Swelling   Other Itching, Shortness Of Breath and Swelling   Ace Inhibitors Cough   Aspirin Other (See Comments)    Reaction with esophagus, unable to swallow.    Losartan Cough   Statins Other (See Comments)    Elevation of liver enzymes   Tapentadol Other (See Comments)    Nausea, increased sleepiness, h/a   Adhesive [Tape] Rash   Tomato Other (See Comments)    Acid reflux due to acid in tomato    Review of Systems: Negative except as noted in the HPI.   Objective:  Angela Cavitt is a pleasant 62 y.o. female obese in NAD. AAO x 3.  Vascular Examination: Vascular status intact b/l with palpable pedal pulses. CFT immediate b/l. Pedal hair present. No edema. No pain with calf compression b/l. Skin temperature gradient WNL b/l. No varicosities noted. No cyanosis or clubbing noted.  Neurological Examination: Pt has subjective symptoms of neuropathy. Protective sensation decreased with 10 gram monofilament b/l.    Dermatological Examination: Pedal skin with normal turgor, texture and tone b/l. No open wounds nor interdigital macerations noted. Toenails 1-5 b/l thick, discolored, elongated with subungual debris and pain on dorsal palpation. No hyperkeratotic lesions noted b/l.   Musculoskeletal Examination: Muscle strength 5/5 to b/l LE.  No pain, crepitus noted b/l. No gross pedal deformities. Patient ambulates independently without assistive aids.   Radiographs: None  Last A1c:      Latest Ref Rng & Units 01/09/2023   11:19 AM 09/04/2022   11:56 AM 06/06/2022    2:18 PM  Hemoglobin A1C  Hemoglobin-A1c 4.0 - 5.6 % 7.4  7.8  10.4     Assessment:   1. Pain due to onychomycosis of toenails of both feet   2. Porokeratosis   3. Callus   4. Diabetic peripheral neuropathy associated with type 2 diabetes mellitus (HCC)    Plan:  -Patient was evaluated and treated. All patient's and/or POA's questions/concerns answered on today's visit. -Patient to continue soft, supportive shoe gear daily. -Toenails 1-5 b/l were debrided in length and girth with sterile nail nippers and dremel without iatrogenic bleeding.  -Callus(es) left foot pared utilizing rotary bur without complication or incident. Total number pared =1. -Porokeratotic lesion(s) right heel pared and enucleated with sterile currette without incident. Total number of lesions debrided=1. -Patient/POA to call should there be question/concern in the interim.  Return in about 3 months (around 05/17/2023).  Freddie Breech, DPM

## 2023-03-07 ENCOUNTER — Other Ambulatory Visit: Payer: Self-pay

## 2023-03-07 ENCOUNTER — Ambulatory Visit (INDEPENDENT_AMBULATORY_CARE_PROVIDER_SITE_OTHER): Payer: 59 | Admitting: Family Medicine

## 2023-03-07 ENCOUNTER — Encounter: Payer: Self-pay | Admitting: Family Medicine

## 2023-03-07 VITALS — BP 125/76 | HR 83 | Ht 64.0 in | Wt 274.0 lb

## 2023-03-07 DIAGNOSIS — L0882 Omphalitis not of newborn: Secondary | ICD-10-CM | POA: Diagnosis not present

## 2023-03-07 DIAGNOSIS — Z794 Long term (current) use of insulin: Secondary | ICD-10-CM

## 2023-03-07 DIAGNOSIS — R52 Pain, unspecified: Secondary | ICD-10-CM | POA: Diagnosis not present

## 2023-03-07 DIAGNOSIS — I10 Essential (primary) hypertension: Secondary | ICD-10-CM

## 2023-03-07 DIAGNOSIS — Z23 Encounter for immunization: Secondary | ICD-10-CM

## 2023-03-07 DIAGNOSIS — I5032 Chronic diastolic (congestive) heart failure: Secondary | ICD-10-CM | POA: Diagnosis not present

## 2023-03-07 DIAGNOSIS — R0609 Other forms of dyspnea: Secondary | ICD-10-CM | POA: Diagnosis not present

## 2023-03-07 DIAGNOSIS — E1165 Type 2 diabetes mellitus with hyperglycemia: Secondary | ICD-10-CM | POA: Diagnosis not present

## 2023-03-07 DIAGNOSIS — F322 Major depressive disorder, single episode, severe without psychotic features: Secondary | ICD-10-CM

## 2023-03-07 DIAGNOSIS — E038 Other specified hypothyroidism: Secondary | ICD-10-CM | POA: Diagnosis not present

## 2023-03-07 DIAGNOSIS — F419 Anxiety disorder, unspecified: Secondary | ICD-10-CM

## 2023-03-07 DIAGNOSIS — E1140 Type 2 diabetes mellitus with diabetic neuropathy, unspecified: Secondary | ICD-10-CM

## 2023-03-07 DIAGNOSIS — E785 Hyperlipidemia, unspecified: Secondary | ICD-10-CM

## 2023-03-07 MED ORDER — CEPHALEXIN 500 MG PO CAPS: 500.0000 mg | ORAL_CAPSULE | Freq: Three times a day (TID) | ORAL | 0 refills | Status: DC

## 2023-03-07 MED ORDER — FLUCONAZOLE 150 MG PO TABS: 150.0000 mg | ORAL_TABLET | Freq: Once | ORAL | 0 refills | Status: DC

## 2023-03-07 MED ORDER — FLUCONAZOLE 150 MG PO TABS: 150.0000 mg | ORAL_TABLET | Freq: Once | ORAL | 0 refills | Status: AC

## 2023-03-07 NOTE — Assessment & Plan Note (Signed)
After obtaining informed consent, the influenza vaccine is  administered , with no adverse effect noted at the time of administration.

## 2023-03-07 NOTE — Assessment & Plan Note (Signed)
Controlled, no change in medication  

## 2023-03-07 NOTE — Assessment & Plan Note (Signed)
Controlled, no change in medication DASH diet and commitment to daily physical activity for a minimum of 30 minutes discussed and encouraged, as a part of hypertension management. The importance of attaining a healthy weight is also discussed.     03/07/2023   10:44 AM 02/06/2023    8:22 AM 01/09/2023   11:02 AM 12/19/2022   10:17 AM 11/08/2022    8:35 AM 09/20/2022   10:26 AM 09/14/2022    4:07 PM  BP/Weight  Systolic BP 125 124 110 129 146 124 137  Diastolic BP 76 72 68 85 77 79 77  Wt. (Lbs) 274 273.2 277.8 283.04  291 297.6  BMI 47.03 kg/m2 46.89 kg/m2 47.68 kg/m2 48.58 kg/m2  49.95 kg/m2 51.08 kg/m2

## 2023-03-07 NOTE — Assessment & Plan Note (Signed)
Stable , no current decompensation 

## 2023-03-07 NOTE — Patient Instructions (Addendum)
Annual exam in 13 weeks, call if you need me sooner  Please schedule wellness at checkout  Keflex and fluconazole are prescribed as discussed  Flu vaccine today in office  Urine ACR to be submitted 11/08 or after  Please get shingrix vaccines at your pharmacy  Thanks for choosing [facility], we consider it a privelige to serve you.

## 2023-03-07 NOTE — Assessment & Plan Note (Signed)
Angela [name] is reminded of the importance of commitment to daily physical activity for 30 minutes or more, as able and the need to limit carbohydrate intake to 30 to 60 grams per meal to help with blood sugar control.   The need to take medication as prescribed, test blood sugar as directed, and to call between visits if there is a concern that blood sugar is uncontrolled is also discussed.   Angela [name] is reminded of the importance of daily foot exam, annual eye examination, and good blood sugar, blood pressure and cholesterol control.     Latest Ref Rng & Units 01/09/2023   11:19 AM 12/19/2022   11:21 AM 09/20/2022   11:43 AM 09/04/2022   11:56 AM 08/31/2022    8:21 AM  Diabetic Labs  HbA1c 4.0 - 5.6 % 7.4    7.8    Chol 100 - 199 mg/dL  161  096     HDL >04 mg/dL  38  34     Calc LDL 0 - 99 mg/dL  58  540     Triglycerides 0 - 149 mg/dL  981  191     Creatinine 0.57 - 1.00 mg/dL  4.78    2.95       62/05/3084   10:44 AM 02/06/2023    8:22 AM 01/09/2023   11:02 AM 12/19/2022   10:17 AM 11/08/2022    8:35 AM 09/20/2022   10:26 AM 09/14/2022    4:07 PM  BP/Weight  Systolic BP 125 124 110 129 146 124 137  Diastolic BP 76 72 68 85 77 79 77  Wt. (Lbs) 274 273.2 277.8 283.04  291 297.6  BMI 47.03 kg/m2 46.89 kg/m2 47.68 kg/m2 48.58 kg/m2  49.95 kg/m2 51.08 kg/m2      Latest Ref Rng & Units 05/10/2022   12:00 AM 01/19/2021   12:00 AM  Foot/eye exam completion dates  Eye Exam No Retinopathy No Retinopathy  No Retinopathy         This result is from an external source.      Improving

## 2023-03-07 NOTE — Progress Notes (Unsigned)
Angela [name]     MRN: [id number]      DOB: Dec 24, 1960  Chief Complaint  Patient presents with   Follow-up    Follow up foul smell coming from navel    HPI [name] is here for follow up and re-evaluation of chronic medical conditions, medication management and review of any available recent lab and radiology data.  Preventive health is updated, specifically  Cancer screening and Immunization.   Questions or concerns regarding consultations or procedures which the PT has had in the interim are  addressed.Has had Cardiology evaluation The PT denies any adverse reactions to current medications since the last visit.  1 week h/o foul smell from navel, denies trauma to area, no fever or chills , no visible drainage  Denies polyuria, polydipsia, blurred vision , or hypoglycemic episodes. Blood sugar improving  ROS Denies recent fever or chills. Denies sinus pressure, nasal congestion, ear pain or sore throat. Denies chest congestion, productive cough or wheezing. Denies chest pains, palpitations and leg swelling Denies abdominal pain, nausea, vomiting,diarrhea or constipation.   Denies dysuria, frequency, hesitancy or incontinence. Denies uncontrolled oint pain, swelling and limitation in mobility. Denies headaches, seizures, numbness, or tingling. Denies uncontrolled  depression, anxiety or insomnia. Denies skin break down or rash.   PE  BP 125/76 (BP Location: Right Arm, Patient Position: Sitting, Cuff Size: Large)   Pulse 83   Ht 5\' 4"  (1.626 m)   Wt 274 lb (124.3 kg)   SpO2 91%   BMI 47.03 kg/m   Patient alert and oriented and in no cardiopulmonary distress.  HEENT: No facial asymmetry, EOMI,     Neck supple .  Chest: Clear to auscultation bilaterally.  CVS: S1, S2 no murmurs, no S3.Regular rate.  ABD: Soft non tender. No visible drainage or erythema periumbilically, q tip inserted in umbilicus, scant yellow/ brown drainage  Ext: No edema  MS: dcereased  ROM spine,  shoulders, hips and knees.  Skin: Intact, no ulcerations or rash noted.  Psych: Good eye contact, normal affect. Memory intact not anxious or depressed appearing.  CNS: CN 2-12 intact, power,  normal throughout.no focal deficits noted.   Assessment & Plan  Essential hypertension, benign Controlled, no change in medication DASH diet and commitment to daily physical activity for a minimum of 30 minutes discussed and encouraged, as a part of hypertension management. The importance of attaining a healthy weight is also discussed.     03/07/2023   10:44 AM 02/06/2023    8:22 AM 01/09/2023   11:02 AM 12/19/2022   10:17 AM 11/08/2022    8:35 AM 09/20/2022   10:26 AM 09/14/2022    4:07 PM  BP/Weight  Systolic BP 125 124 110 129 146 124 137  Diastolic BP 76 72 68 85 77 79 77  Wt. (Lbs) 274 273.2 277.8 283.04  291 297.6  BMI 47.03 kg/m2 46.89 kg/m2 47.68 kg/m2 48.58 kg/m2  49.95 kg/m2 51.08 kg/m2       Encounter for pain management The patient's Controlled Substance registry is reviewed and compliance confirmed. Adequacy of  Pain control and level of function is assessed. Medication dosing is adjusted as deemed appropriate. Twelve weeks of medication is prescribed , with a follow up appointment between 11 to 12 weeks .   Encounter for immunization After obtaining informed consent, the influenza  vaccine is  administered , with no adverse effect noted at the time of administration.   Hyperlipidemia LDL goal <100 Hyperlipidemia:Low fat diet discussed and  encouraged.   Lipid Panel  Lab Results  Component Value Date   CHOL 118 12/19/2022   HDL 38 (L) 12/19/2022   LDLCALC 58 12/19/2022   TRIG 119 12/19/2022   CHOLHDL 3.1 12/19/2022     Controlled, no change in medication Updated lab needed at/ before next visit.   Hypothyroidism Controlled, no change in medication   Depression, Ericson, single episode, severe (HCC) Controlled, no change in  medication   Anxiety Controlled, no change in medication   Chronic diastolic heart failure (HCC) Stable, no current decompensation  DM (diabetes mellitus) (HCC) [name] is reminded of the importance of commitment to daily physical activity for 30 minutes or more, as able and the need to limit carbohydrate intake to 30 to 60 grams per meal to help with blood sugar control.   The need to take medication as prescribed, test blood sugar as directed, and to call between visits if there is a concern that blood sugar is uncontrolled is also discussed.   [name] is reminded of the importance of daily foot exam, annual eye examination, and good blood sugar, blood pressure and cholesterol control.     Latest Ref Rng & Units 01/09/2023   11:19 AM 12/19/2022   11:21 AM 09/20/2022   11:43 AM 09/04/2022   11:56 AM 08/31/2022    8:21 AM  Diabetic Labs  HbA1c 4.0 - 5.6 % 7.4    7.8    Chol 100 - 199 mg/dL  629  528     HDL >41 mg/dL  38  34     Calc LDL 0 - 99 mg/dL  58  324     Triglycerides 0 - 149 mg/dL  401  027     Creatinine 0.57 - 1.00 mg/dL  2.53    6.64       40/06/4740   10:44 AM 02/06/2023    8:22 AM 01/09/2023   11:02 AM 12/19/2022   10:17 AM 11/08/2022    8:35 AM 09/20/2022   10:26 AM 09/14/2022    4:07 PM  BP/Weight  Systolic BP 125 124 110 129 146 124 137  Diastolic BP 76 72 68 85 77 79 77  Wt. (Lbs) 274 273.2 277.8 283.04  291 297.6  BMI 47.03 kg/m2 46.89 kg/m2 47.68 kg/m2 48.58 kg/m2  49.95 kg/m2 51.08 kg/m2      Latest Ref Rng & Units 05/10/2022   12:00 AM 01/19/2021   12:00 AM  Foot/eye exam completion dates  Eye Exam No Retinopathy No Retinopathy  No Retinopathy         This result is from an external source.      Improving  Omphalitis in adult Keflex x 5 days prescribed, no obvious infection, treated primarily based on pt reporting

## 2023-03-07 NOTE — Assessment & Plan Note (Signed)
The patient's Controlled Substance registry is reviewed and compliance confirmed. Adequacy of  Pain control and level of function is assessed. Medication dosing is adjusted as deemed appropriate. Twelve weeks of medication is prescribed , with a follow up appointment between 11 to 12 weeks .  

## 2023-03-07 NOTE — Assessment & Plan Note (Signed)
Hyperlipidemia:Low fat diet discussed and encouraged.   Lipid Panel  Lab Results  Component Value Date   CHOL 118 12/19/2022   HDL 38 (L) 12/19/2022   LDLCALC 58 12/19/2022   TRIG 119 12/19/2022   CHOLHDL 3.1 12/19/2022     Controlled, no change in medication Updated lab needed at/ before next visit.

## 2023-03-07 NOTE — Progress Notes (Incomplete)
   Angela [name]     MRN: [id number]      DOB: 1960/06/14  Chief Complaint  Patient presents with  . Follow-up    Follow up foul smell coming from navel    HPI Angela [name] is here for follow up and re-evaluation of chronic medical conditions, medication management and review of any available recent lab and radiology data.  Preventive health is updated, specifically  Cancer screening and Immunization.   Questions or concerns regarding consultations or procedures which the PT has had in the interim are  addressed.Has had Cardiology evaluation The PT denies any adverse reactions to current medications since the last visit.  1 week h/o foul smell from navel, denies trauma to area, no fever or chills , no visible drainage  Denies polyuria, polydipsia, blurred vision , or hypoglycemic episodes. Blood sugar improving  ROS Denies recent fever or chills. Denies sinus pressure, nasal congestion, ear pain or sore throat. Denies chest congestion, productive cough or wheezing. Denies chest pains, palpitations and leg swelling Denies abdominal pain, nausea, vomiting,diarrhea or constipation.   Denies dysuria, frequency, hesitancy or incontinence. Denies uncontrolled oint pain, swelling and limitation in mobility. Denies headaches, seizures, numbness, or tingling. Denies uncontrolled  depression, anxiety or insomnia. Denies skin break down or rash.   PE  BP 125/76 (BP Location: Right Arm, Patient Position: Sitting, Cuff Size: Large)   Pulse 83   Ht 5\' 4"  (1.626 m)   Wt 274 lb (124.3 kg)   SpO2 91%   BMI 47.03 kg/m   Patient alert and oriented and in no cardiopulmonary distress.  HEENT: No facial asymmetry, EOMI,     Neck supple .  Chest: Clear to auscultation bilaterally.  CVS: S1, S2 no murmurs, no S3.Regular rate.  ABD: Soft non tender. No visible drainage or erythema periumbilically, q tip inserted in umbilicus, scant yellow/ brown drainage  Ext: No edema  MS: dcereased  ROM  spine, shoulders, hips and knees.  Skin: Intact, no ulcerations or rash noted.  Psych: Good eye contact, normal affect. Memory intact not anxious or depressed appearing.  CNS: CN 2-12 intact, power,  normal throughout.no focal deficits noted.   Assessment & Plan  No problem-specific Assessment & Plan notes found for this encounter.

## 2023-03-08 DIAGNOSIS — L0882 Omphalitis not of newborn: Secondary | ICD-10-CM | POA: Insufficient documentation

## 2023-03-08 MED ORDER — OXYCODONE HCL ER 40 MG PO T12A: EXTENDED_RELEASE_TABLET | ORAL | 0 refills | Status: DC

## 2023-03-08 NOTE — Assessment & Plan Note (Signed)
Keflex x 5 days prescribed, no obvious infection, treated primarily based on pt reporting

## 2023-03-09 DIAGNOSIS — E1165 Type 2 diabetes mellitus with hyperglycemia: Secondary | ICD-10-CM | POA: Diagnosis not present

## 2023-03-11 ENCOUNTER — Other Ambulatory Visit: Payer: Self-pay | Admitting: Family Medicine

## 2023-03-11 LAB — MICROALBUMIN / CREATININE URINE RATIO
Creatinine, Urine: 150.7 mg/dL
Microalb/Creat Ratio: 9 mg/g{creat} (ref 0–29)
Microalbumin, Urine: 13.3 ug/mL

## 2023-03-19 ENCOUNTER — Other Ambulatory Visit: Payer: Self-pay | Admitting: Family Medicine

## 2023-03-19 MED ORDER — OXYCODONE HCL ER 40 MG PO T12A: 40.0000 mg | EXTENDED_RELEASE_TABLET | Freq: Two times a day (BID) | ORAL | 0 refills | Status: AC

## 2023-03-19 MED ORDER — OXYCODONE HCL ER 40 MG PO T12A: 40.0000 mg | EXTENDED_RELEASE_TABLET | Freq: Two times a day (BID) | ORAL | 0 refills | Status: DC

## 2023-03-19 NOTE — Progress Notes (Signed)
OXYCONTIN RE  SENT TO [facility] WHERE SHE FILLS SCRIPT

## 2023-03-19 NOTE — Telephone Encounter (Signed)
Copied from CRM [id number]. Topic: Clinical - Medication Refill >> Mar 19, 2023 12:17 PM Lorin Glass B wrote: Most Recent Primary Care Visit:  Provider: Kerri Perches  Department: [facility]  Visit Type: OFFICE VISIT  Date: 03/07/2023  Medication: ***  Has the patient contacted their pharmacy?  (Agent: If no, request that the patient contact the pharmacy for the refill. If patient does not wish to contact the pharmacy document the reason why and proceed with request.) (Agent: If yes, when and what did the pharmacy advise?)  Is this the correct pharmacy for this prescription?  If no, delete pharmacy and type the correct one.  This is the patient's preferred pharmacy:  [facility] - [place], [place] - [place] [address] [place] [address] Phone: [phone] Fax: [phone]  [id number] - [place],  - [address] AT [facility] [address] [place] [address] Phone: [phone] Fax: [phone]   Has the prescription been filled recently?   Is the patient out of the medication?   Has the patient been seen for an appointment in the last year OR does the patient have an upcoming appointment?   Can we respond through MyChart?   Agent: Please be advised that Rx refills may take up to 3 business days. We ask that you follow-up with your pharmacy.

## 2023-03-20 ENCOUNTER — Telehealth: Payer: Self-pay

## 2023-03-20 NOTE — Telephone Encounter (Signed)
Copied from CRM [id number]. Topic: Clinical - Prescription Issue >> Mar 20, 2023  2:09 PM Roswell Nickel wrote: Reason for CRM:  patient calling regarding script for Ambien She stating  that  pharmacy sent the request on Sunday Nov.17, 2024. Patient stating she out of medication for 3 days. Pt contact [phone]

## 2023-03-22 ENCOUNTER — Other Ambulatory Visit: Payer: Self-pay | Admitting: Family Medicine

## 2023-03-22 MED ORDER — ZOLPIDEM TARTRATE 10 MG PO TABS: 10.0000 mg | ORAL_TABLET | Freq: Every day | ORAL | 5 refills | Status: AC

## 2023-03-22 NOTE — Addendum Note (Signed)
Addended by: Kerri Perches on: 03/22/2023 07:45 AM   Modules accepted: Orders

## 2023-03-28 ENCOUNTER — Other Ambulatory Visit: Payer: Self-pay | Admitting: Pulmonary Disease

## 2023-03-30 NOTE — Telephone Encounter (Signed)
Please advise on refill request

## 2023-04-02 ENCOUNTER — Other Ambulatory Visit: Payer: Self-pay | Admitting: Nurse Practitioner

## 2023-04-03 ENCOUNTER — Telehealth: Payer: Self-pay

## 2023-04-03 ENCOUNTER — Other Ambulatory Visit: Payer: Self-pay | Admitting: Family Medicine

## 2023-04-03 MED ORDER — OXYCODONE HCL ER 40 MG PO T12A: EXTENDED_RELEASE_TABLET | ORAL | 0 refills | Status: DC

## 2023-04-03 MED ORDER — OXYCODONE HCL ER 40 MG PO T12A: 40.0000 mg | EXTENDED_RELEASE_TABLET | Freq: Two times a day (BID) | ORAL | 0 refills | Status: AC

## 2023-04-03 MED ORDER — OXYCODONE HCL ER 40 MG PO T12A: EXTENDED_RELEASE_TABLET | ORAL | 0 refills | Status: AC

## 2023-04-03 NOTE — Telephone Encounter (Signed)
Copied from CRM [id number]. Topic: Clinical - Prescription Issue >> Apr 03, 2023  9:18 AM Fonda Kinder J wrote: Reason for CRM: Patients OxyContin was sent to [id number] - [place], [place] - [address]  [address], [place] [address] instead of [id number] - [place], [place] - [address] AT [facility]  [address],  [place] [address] . Patient is completely out of the medication and needs the prescription to [facility] to [facility] at [address]

## 2023-04-03 NOTE — Progress Notes (Signed)
Prescriptions sent to correct pharmacy

## 2023-04-06 DIAGNOSIS — R0609 Other forms of dyspnea: Secondary | ICD-10-CM | POA: Diagnosis not present

## 2023-04-14 ENCOUNTER — Other Ambulatory Visit: Payer: Self-pay | Admitting: Family Medicine

## 2023-04-14 DIAGNOSIS — M62830 Muscle spasm of back: Secondary | ICD-10-CM

## 2023-05-04 ENCOUNTER — Other Ambulatory Visit: Payer: Self-pay | Admitting: Family Medicine

## 2023-05-07 DIAGNOSIS — R0609 Other forms of dyspnea: Secondary | ICD-10-CM | POA: Diagnosis not present

## 2023-05-14 ENCOUNTER — Ambulatory Visit: Payer: 59 | Admitting: Nurse Practitioner

## 2023-05-14 DIAGNOSIS — I10 Essential (primary) hypertension: Secondary | ICD-10-CM

## 2023-05-14 DIAGNOSIS — E559 Vitamin D deficiency, unspecified: Secondary | ICD-10-CM

## 2023-05-14 DIAGNOSIS — E039 Hypothyroidism, unspecified: Secondary | ICD-10-CM

## 2023-05-14 DIAGNOSIS — Z7985 Long-term (current) use of injectable non-insulin antidiabetic drugs: Secondary | ICD-10-CM

## 2023-05-14 DIAGNOSIS — Z794 Long term (current) use of insulin: Secondary | ICD-10-CM

## 2023-05-14 DIAGNOSIS — E1165 Type 2 diabetes mellitus with hyperglycemia: Secondary | ICD-10-CM

## 2023-05-14 DIAGNOSIS — E1159 Type 2 diabetes mellitus with other circulatory complications: Secondary | ICD-10-CM | POA: Diagnosis not present

## 2023-05-14 DIAGNOSIS — Z7984 Long term (current) use of oral hypoglycemic drugs: Secondary | ICD-10-CM

## 2023-05-14 DIAGNOSIS — E782 Mixed hyperlipidemia: Secondary | ICD-10-CM

## 2023-05-15 LAB — COMPREHENSIVE METABOLIC PANEL
ALT: 26 [IU]/L (ref 0–32)
AST: 31 [IU]/L (ref 0–40)
Albumin: 4.6 g/dL (ref 3.9–4.9)
Alkaline Phosphatase: 44 [IU]/L (ref 44–121)
BUN/Creatinine Ratio: 23 (ref 12–28)
BUN: 25 mg/dL (ref 8–27)
Bilirubin Total: 0.3 mg/dL (ref 0.0–1.2)
CO2: 23 mmol/L (ref 20–29)
Calcium: 10.2 mg/dL (ref 8.7–10.3)
Chloride: 101 mmol/L (ref 96–106)
Creatinine, Ser: 1.07 mg/dL — ABNORMAL HIGH (ref 0.57–1.00)
Globulin, Total: 3.1 g/dL (ref 1.5–4.5)
Glucose: 114 mg/dL — ABNORMAL HIGH (ref 70–99)
Potassium: 4.4 mmol/L (ref 3.5–5.2)
Sodium: 141 mmol/L (ref 134–144)
Total Protein: 7.7 g/dL (ref 6.0–8.5)
eGFR: 58 mL/min/{1.73_m2} — ABNORMAL LOW (ref >59)

## 2023-05-15 LAB — TSH: TSH: 1.79 u[IU]/mL (ref 0.450–4.500)

## 2023-05-15 LAB — T4, FREE: Free T4: 1.6 ng/dL (ref 0.82–1.77)

## 2023-05-18 ENCOUNTER — Encounter: Payer: Self-pay | Admitting: Nurse Practitioner

## 2023-05-18 ENCOUNTER — Ambulatory Visit (INDEPENDENT_AMBULATORY_CARE_PROVIDER_SITE_OTHER): Payer: 59 | Admitting: Nurse Practitioner

## 2023-05-18 VITALS — BP 108/65 | HR 68 | Ht 64.0 in | Wt 275.8 lb

## 2023-05-18 DIAGNOSIS — E1159 Type 2 diabetes mellitus with other circulatory complications: Secondary | ICD-10-CM

## 2023-05-18 DIAGNOSIS — E039 Hypothyroidism, unspecified: Secondary | ICD-10-CM | POA: Diagnosis not present

## 2023-05-18 DIAGNOSIS — E782 Mixed hyperlipidemia: Secondary | ICD-10-CM | POA: Diagnosis not present

## 2023-05-18 DIAGNOSIS — Z7985 Long-term (current) use of injectable non-insulin antidiabetic drugs: Secondary | ICD-10-CM | POA: Diagnosis not present

## 2023-05-18 DIAGNOSIS — I10 Essential (primary) hypertension: Secondary | ICD-10-CM

## 2023-05-18 DIAGNOSIS — E559 Vitamin D deficiency, unspecified: Secondary | ICD-10-CM | POA: Diagnosis not present

## 2023-05-18 DIAGNOSIS — Z7984 Long term (current) use of oral hypoglycemic drugs: Secondary | ICD-10-CM | POA: Diagnosis not present

## 2023-05-18 DIAGNOSIS — Z794 Long term (current) use of insulin: Secondary | ICD-10-CM | POA: Diagnosis not present

## 2023-05-18 LAB — POCT GLYCOSYLATED HEMOGLOBIN (HGB A1C): Hemoglobin A1C: 7.2 % — AB (ref 4.0–5.6)

## 2023-05-18 MED ORDER — TIRZEPATIDE 10 MG/0.5ML ~~LOC~~ SOAJ: 10.0000 mg | SUBCUTANEOUS | 1 refills | Status: DC

## 2023-05-18 MED ORDER — LEVOTHYROXINE SODIUM 75 MCG PO TABS: 75.0000 ug | ORAL_TABLET | Freq: Every day | ORAL | 3 refills | Status: DC

## 2023-05-18 MED ORDER — INSULIN LISPRO PROT & LISPRO (75-25 MIX) 100 UNIT/ML KWIKPEN: 20.0000 [IU] | PEN_INJECTOR | Freq: Two times a day (BID) | SUBCUTANEOUS | 3 refills | Status: DC

## 2023-05-18 MED ORDER — BD PEN NEEDLE MINI U/F 31G X 5 MM MISC: 6 refills | Status: AC

## 2023-05-18 NOTE — Progress Notes (Signed)
05/18/2023, 10:35 AM                    Endocrinology follow-up note   Subjective:    [name], female    DOB: [birth year].  Angela Nevin is being seen in follow-up in the management of currently uncontrolled symptomatic type 2 diabetes.  She also has comorbid dyslipidemia, hypertension.   PMD:  Kerri Perches, MD.   Past Medical History:  Diagnosis Date   Allergy    Phreesia 06/02/2020   Anxiety    Arthritis    Asthma    Asthma    Phreesia 04/12/2020   Breast mass    left (monitor)   CHF (congestive heart failure) (HCC)    Phreesia 04/12/2020   Coronary atherosclerosis of native coronary artery    Mild nonobstructive CAD by cardiac catheterization 2008 - Dr. Sharyn Lull   Degenerative disc disease    Depression    Depression    Phreesia 04/12/2020   Diabetes mellitus without complication (HCC)    Phreesia 04/12/2020   Dysrhythmia    Essential hypertension, benign    Folliculitis 01/21/2015   GERD (gastroesophageal reflux disease)    H/O hiatal hernia    Helicobacter pylori gastritis 2008   Hypercholesterolemia    Hypertension    Hyperthyroidism    s/p radiation   Left-sided epistaxis 05/15/2018   Low back pain    Migraine    Nephrolithiasis    Recurring episodes since 2004   Neuromuscular disorder (HCC)    Phreesia 04/12/2020   Severe obesity (BMI >= 40) (HCC)    Sleep apnea    STOP BANG SCORE 6no cpap used   Thyroid disease    Phreesia 04/12/2020   Type 2 diabetes mellitus with diabetic neuropathy (HCC)     Past Surgical History:  Procedure Laterality Date   ABDOMINAL HYSTERECTOMY     BACK SURGERY  1993   BACK SURGERY  1994   BACK SURGERY  2002   BACK SURGERY  2011   [facility]    CARPAL TUNNEL RELEASE Left    CHOLECYSTECTOMY  1996   COLONOSCOPY  2000 [facility]   INT HEMORRHOIDS/FISSURE   COLONOSCOPY  2003 [facility], CHANGE IN BOWEL HABITS   INT HEMORRHOIDS   COLONOSCOPY  2006 [facility]   INT HEMORRHOIDS    COLONOSCOPY  2007 [facility]   INT HEMORRHOIDS   CYSTOSCOPY WITH STENT PLACEMENT Left 07/25/2012   Procedure: CYSTOSCOPY, left retograde pyelogram WITH left ureteral  STENT PLACEMENT;  Surgeon: Garnett Farm, MD;  Location: [facility];  Service: Urology;  Laterality: Left;   CYSTOSCOPY/RETROGRADE/URETEROSCOPY  12/04/2011   Procedure: CYSTOSCOPY/RETROGRADE/URETEROSCOPY;  Surgeon: Milford Cage, MD;  Location: [facility];  Service: Urology;  Laterality: Right;   CYSTOSCOPY/RETROGRADE/URETEROSCOPY/STONE EXTRACTION WITH BASKET Left 09/03/2012   Procedure: CYSTOSCOPY/RETROGRADE pyelogram/digital URETEROSCOPY/STONE EXTRACTION WITH BASKET, left stent removal;  Surgeon: Milford Cage, MD;  Location: [facility];  Service: Urology;  Laterality: Left;   ESOPHAGOGASTRODUODENOSCOPY  2008   [id number] hiatal hernia./Normal esophagus without evidence of Barrett's mass, stricture, erosion or ulceration./Normal duodenal bulb and second portion of the duodenum./Diffuse erythema in the body and the antrum with occasional erosion.  Biopsies obtained via cold forceps to evaluate for H. pylori gastritis   FRACTURE SURGERY  1999   right clavicle   HOLMIUM LASER APPLICATION Left 09/03/2012   Procedure: HOLMIUM LASER APPLICATION;  Surgeon: Milford Cage, MD;  Location: [facility];  Service: Urology;  Laterality: Left;   KNEE ARTHROSCOPY  10/04/2010,    right knee arthroscopy, dr Thomasena Edis   LEFT HEART CATH AND CORONARY ANGIOGRAPHY N/A 05/08/2018   Procedure: LEFT HEART CATH AND CORONARY ANGIOGRAPHY;  Surgeon: Lennette Bihari, MD;  Location: [facility]VASIVE CV [facility];  Service: Cardiovascular;  Laterality: N/A;   left knee arthroscopic surgery  1999   Left salphingectomy secondary to ectopic pregnancy  1991   PARTIAL HYSTERECTOMY  1991   RIGHT HEART CATH N/A 07/07/2019   Procedure: RIGHT HEART CATH;  Surgeon: Marykay Lex, MD;  Location: [facility] INVASIVE CV LAB;  Service: Cardiovascular;  Laterality: N/A;   rotary cuff  Right  05/14/2014   [facility]   SPINE SURGERY N/A    Phreesia 06/02/2020   UPPER GASTROINTESTINAL ENDOSCOPY  2008 abd pain   H. pylori gastritis   UPPER GASTROINTESTINAL ENDOSCOPY  1996 [place] [place]   [facility]    Social History   Socioeconomic History   Marital status: Single    Spouse name: Not on file   Number of children: 1   Years of education: 12   Highest education level: Some college, no degree  Occupational History   Occupation: Disable   Tobacco Use   Smoking status: Never   Smokeless tobacco: Never  Vaping Use   Vaping status: Never Used  Substance and Sexual Activity   Alcohol use: No   Drug use: No   Sexual activity: Not Currently    Partners: Male    Birth control/protection: Surgical    Comment: hyst  Other Topics Concern   Not on file  Social History Narrative   Lives alone    Social Drivers of Health   Financial Resource Strain: Low Risk  (03/03/2023)   Overall Financial Resource Strain (CARDIA)    Difficulty of Paying Living Expenses: Not very hard  Food Insecurity: No Food Insecurity (03/03/2023)   Hunger Vital Sign    Worried About Running Out of Food in the Last Year: Never true    Ran Out of Food in the Last Year: Never true  Transportation Needs: No Transportation Needs (03/03/2023)   PRAPARE - Administrator, Civil Service (Medical): No    Lack of Transportation (Non-Medical): No  Physical Activity: Insufficiently Active (03/03/2023)   Exercise Vital Sign    Days of Exercise per Week: 3 days    Minutes of Exercise per Session: 30 min  Stress: No Stress Concern Present (03/03/2023)   [employer] of Occupational Health - Occupational Stress Questionnaire    Feeling of Stress : Only a little  Social Connections: Moderately Integrated (03/03/2023)   Social Connection and Isolation Panel [NHANES]    Frequency of Communication with Friends and Family: More than three times a week    Frequency of Social Gatherings with Friends and Family: Never     Attends Religious Services: 1 to 4 times per year    Active Member of [employer] or Organizations: Yes    Attends Banker Meetings: 1 to 4 times per year    Marital Status: Divorced    Family History  Problem Relation Age of Onset   Heart disease Mother    Hypertension Mother    Cancer Mother        Cervical    Asthma Mother    Heart attack Father    Cancer Father        Prostate   Colon cancer Father        DECEASED  AGE 59   Colon polyps Neg Hx     Outpatient Encounter Medications as of 05/18/2023  Medication Sig   albuterol (VENTOLIN HFA) 108 (90 Base) MCG/ACT inhaler TAKE 2 PUFFS BY MOUTH EVERY 6 HOURS AS NEEDED FOR WHEEZE OR SHORTNESS OF BREATH   amLODipine (NORVASC) 10 MG tablet TAKE 1 TABLET BY MOUTH EVERY DAY   betamethasone, augmented, (DIPROLENE) 0.05 % lotion Apply 1 application. topically 2 (two) times daily.   blood glucose meter kit and supplies Dispense based on patient and insurance preference. Once daily testing dx. e11.9   calcipotriene (DOVONOX) 0.005 % ointment Apply 1 application topically daily.    clobetasol cream (TEMOVATE) 0.05 % Apply 1 application. topically 2 (two) times daily.   cloNIDine (CATAPRES - DOSED IN MG/24 HR) 0.3 mg/24hr patch Apply one patch to skin once weekly   cloNIDine (CATAPRES) 0.3 MG tablet TAKE 1 TABLET (0.3 MG TOTAL) BY MOUTH AT BEDTIME.   cyclobenzaprine (FLEXERIL) 10 MG tablet TAKE 1 TABLET BY MOUTH THREE TIMES A DAY   diazepam (VALIUM) 5 MG tablet Take one tablet by mouth three times daily for back spasm   DULoxetine (CYMBALTA) 60 MG capsule Take 1 capsule (60 mg total) by mouth 2 (two) times daily.   esomeprazole (NEXIUM) 40 MG capsule TAKE 1 CAPSULE BY MOUTH EVERY DAY   fenofibrate (TRICOR) 145 MG tablet TAKE 1 TABLET BY MOUTH EVERY DAY   furosemide (LASIX) 40 MG tablet TAKE 1 TABLET BY MOUTH EVERY DAY   gabapentin (NEURONTIN) 800 MG tablet TAKE ONE TABLET BY MOUTH FOUR TIMES DAILY FOR PAIN   hydrocortisone  (PROCTOSOL HC) 2.5 % rectal cream    ipratropium-albuterol (DUONEB) 0.5-2.5 (3) MG/3ML SOLN Take 3 mLs by nebulization every 6 (six) hours as needed.   KLOR-CON M20 20 MEQ tablet TAKE 1 TABLET BY MOUTH TWICE A DAY   Lancets (ONETOUCH DELICA PLUS LANCET33G) MISC USE TO TEST ONCE DAILY   loratadine (CLARITIN) 10 MG tablet TAKE 1 TABLET BY MOUTH EVERY DAY   metFORMIN (GLUCOPHAGE) 500 MG tablet Take 1 tablet (500 mg total) by mouth 2 (two) times daily with a meal.   metoprolol tartrate (LOPRESSOR) 25 MG tablet Take 1 tablet (25 mg total) by mouth 2 (two) times daily.   naloxone (NARCAN) nasal spray 4 mg/0.1 mL One spray into each nostril once as needed   nitroGLYCERIN (NITROSTAT) 0.4 MG SL tablet Place 1 tablet (0.4 mg total) under the tongue every 5 (five) minutes as needed for chest pain.   ONETOUCH VERIO test strip TEST BLOOD SUGAR 4 TIMES A DAY AS DIRECTED   oxyCODONE (OXYCONTIN) 40 mg 12 hr tablet Take 1 tablet (40 mg total) by mouth every 12 (twelve) hours.   [START ON 06/02/2023] oxyCODONE (OXYCONTIN) 40 mg 12 hr tablet Take 1 tablet (40 mg total) by mouth every 12 (twelve) hours.   oxyCODONE (OXYCONTIN) 40 mg 12 hr tablet Take one tablet by mouth two times daily for pain   [START ON 06/02/2023] oxyCODONE (OXYCONTIN) 40 mg 12 hr tablet Take one tablet by mouth two times daily for pain   spironolactone (ALDACTONE) 25 MG tablet TAKE 1 TABLET (25 MG TOTAL) BY MOUTH DAILY.   SYMBICORT 160-4.5 MCG/ACT inhaler Inhale 2 puffs into the lungs in the morning and at bedtime.   tirzepatide (MOUNJARO) 10 MG/0.5ML Pen Inject 10 mg into the skin once a week.   trimethoprim-polymyxin b (POLYTRIM) ophthalmic solution PLACE 1 DROP INTO BOTH EYES EVERY 4 (FOUR) HOURS FOR 7  DAYS. MAX 4 DOSES PER DAY.   UNABLE TO FIND BD Ultra-Fine Mini Pen Needle 31 gauge x 3/16"  USE AS DIRECTED TWICE A DAY   UNABLE TO FIND OneTouch Verio test strips  USE AS INSTRUCTED FOUR TIMES DAILY TESTING DX E11.65   UNABLE TO FIND OneTouch  Delica Plus Lancet 33 gauge  USE TO TEST ONCE DAILY   UNABLE TO FIND Med Name:  Nutritional supplement 225 units/m or PRN Incontinence pads   264/m or PRN Underpads/Chux     90/m or PRN Nonsterile gloves  1B/M OR PRN   UNABLE TO FIND Med Name: Step stool with handle   UNABLE TO FIND Med Name:  BEDSIDE STEP STOOL WITH HANDLE   Vitamin D, Ergocalciferol, (DRISDOL) 1.25 MG (50000 UNIT) CAPS capsule TAKE 1 CAPSULE (50,000 UNITS TOTAL) BY MOUTH ONCE A WEEK. ONE CAPSULE ONCE WEEKLY   zolpidem (AMBIEN) 10 MG tablet TAKE 1 TABLET BY MOUTH EVERYDAY AT BEDTIME   zolpidem (AMBIEN) 10 MG tablet Take 1 tablet (10 mg total) by mouth at bedtime.   zolpidem (AMBIEN) 10 MG tablet Take 1 tablet (10 mg total) by mouth at bedtime.   [DISCONTINUED] B-D UF III MINI PEN NEEDLES 31G X 5 MM MISC Use to inject insulin twice daily   [DISCONTINUED] Insulin Lispro Prot & Lispro (HUMALOG 75/25 MIX) (75-25) 100 UNIT/ML Kwikpen Inject 20 Units into the skin in the morning and at bedtime.   [DISCONTINUED] levothyroxine (SYNTHROID) 75 MCG tablet Take 1 tablet (75 mcg total) by mouth daily.   [DISCONTINUED] tirzepatide (MOUNJARO) 7.5 MG/0.5ML Pen Inject 7.5 mg into the skin once a week.   B-D UF III MINI PEN NEEDLES 31G X 5 MM MISC Use to inject insulin twice daily   Insulin Lispro Prot & Lispro (HUMALOG 75/25 MIX) (75-25) 100 UNIT/ML Kwikpen Inject 20 Units into the skin in the morning and at bedtime.   levothyroxine (SYNTHROID) 75 MCG tablet Take 1 tablet (75 mcg total) by mouth daily.   [DISCONTINUED] cephALEXin (KEFLEX) 500 MG capsule Take 1 capsule (500 mg total) by mouth 3 (three) times daily.   [DISCONTINUED] ezetimibe (ZETIA) 10 MG tablet Take 1 tablet (10 mg total) by mouth daily.   No facility-administered encounter medications on file as of 05/18/2023.    ALLERGIES: Allergies  Allergen Reactions   Latex Itching and Rash   Morphine Other (See Comments)    Reaction with esophagus, unable to swallow.     Morphine And Codeine Shortness Of Breath and Swelling   Other Itching, Shortness Of Breath and Swelling   Ace Inhibitors Cough   Aspirin Other (See Comments)    Reaction with esophagus, unable to swallow.    Losartan Cough   Statins Other (See Comments)    Elevation of liver enzymes   Tapentadol Other (See Comments)    Nausea, increased sleepiness, h/a   Adhesive [Tape] Rash   Tomato Other (See Comments)    Acid reflux due to acid in tomato    VACCINATION STATUS: Immunization History  Administered Date(s) Administered   Influenza Split 03/30/2011   Influenza Whole 03/21/2005, 02/09/2006   Influenza, Seasonal, Injecte, Preservative Fre 03/07/2023   Influenza,inj,Quad PF,6+ Mos 02/11/2014, 07/27/2015, 03/09/2016, 01/29/2017, 02/07/2018, 01/30/2019, 02/11/2020, 03/03/2021, 12/29/2021   Influenza-Unspecified 01/29/2017   PFIZER(Purple Top)SARS-COV-2 Vaccination 07/22/2019, 08/12/2019, 03/05/2020, 11/19/2020   Pneumococcal Polysaccharide-23 04/17/2006, 09/14/2015   Td 04/17/2006   Tdap 12/19/2022   Zoster Recombinant(Shingrix) 09/22/2022    Diabetes She presents for her follow-up diabetic visit. She has  type 2 diabetes mellitus. Her disease course has been improving. There are no hypoglycemic associated symptoms. Pertinent negatives for hypoglycemia include no confusion, headaches, pallor or seizures. Associated symptoms include fatigue. Pertinent negatives for diabetes include no blurred vision, no chest pain, no polydipsia, no polyphagia, no polyuria and no weight loss. There are no hypoglycemic complications. Symptoms are improving. Diabetic complications include heart disease, nephropathy and peripheral neuropathy. Risk factors for coronary artery disease include dyslipidemia, diabetes mellitus, family history, hypertension, obesity, sedentary lifestyle, tobacco exposure and post-menopausal. Current diabetic treatment includes insulin injections and oral agent (monotherapy) (and  mounjaro). She is compliant with treatment most of the time. Her weight is fluctuating minimally. She is following a generally healthy diet. When asked about meal planning, she reported none. She has had a previous visit with a dietitian. She never participates in exercise. Her home blood glucose trend is decreasing steadily. Her breakfast blood glucose range is generally 140-180 mg/dl. Her lunch blood glucose range is generally 110-130 mg/dl. Her bedtime blood glucose range is generally 130-140 mg/dl. (She presents today with her logs, and meter showing improving glycemic profile.  Her POCT A1c today is 7.2%, improving from last visit of 7.4%.  She admits to overindulging during the holidays but is getting back on track.  She denies any significant hypoglycemia.  She has been tolerating the [facility] well, no unpleasant SE.  There was a period of time over the last 3 months where she wasn't checking glucose as often as she was supposed to.) An ACE inhibitor/angiotensin II receptor blocker is contraindicated. She does not see a podiatrist.Eye exam is not current.  Hyperlipidemia This is a chronic problem. The current episode started more than 1 year ago. The problem is controlled. Recent lipid tests were reviewed and are normal. Exacerbating diseases include chronic renal disease, diabetes, hypothyroidism and obesity. Factors aggravating her hyperlipidemia include beta blockers and thiazides. Pertinent negatives include no chest pain, myalgias or shortness of breath. Current antihyperlipidemic treatment includes statins. The current treatment provides mild improvement of lipids. Compliance problems include adherence to diet and adherence to exercise.  Risk factors for coronary artery disease include diabetes mellitus, dyslipidemia, hypertension, obesity and a sedentary lifestyle.  Hypertension This is a chronic problem. The current episode started more than 1 year ago. The problem has been gradually improving  since onset. The problem is controlled. Associated symptoms include peripheral edema. Pertinent negatives include no blurred vision, chest pain, headaches, palpitations or shortness of breath. Agents associated with hypertension include thyroid hormones. Risk factors for coronary artery disease include family history, dyslipidemia, diabetes mellitus, obesity, sedentary lifestyle and post-menopausal state. Past treatments include calcium channel blockers, diuretics and beta blockers. The current treatment provides moderate improvement. Compliance problems include diet and exercise.  Hypertensive end-organ damage includes kidney disease, CAD/MI and heart failure. Identifiable causes of hypertension include chronic renal disease and sleep apnea.    Review of systems  Constitutional: +stable body weight,  current Body mass index is 47.34 kg/m. , + fatigue, no subjective hyperthermia, no subjective hypothermia Eyes: no blurry vision, no xerophthalmia ENT: no sore throat, no nodules palpated in throat, no dysphagia/odynophagia, no hoarseness Cardiovascular: no chest pain, no shortness of breath, no palpitations, no leg swelling Respiratory: no cough, no SOB Gastrointestinal: no nausea/vomiting/diarrhea Musculoskeletal: no muscle/joint aches, walks with cane d/t hx of falls Skin: no rashes, no hyperemia Neurological: no tremors, no numbness, no tingling, no dizziness Psychiatric: no depression, no anxiety,    Objective:    BP 108/65 (  BP Location: Left Arm, Patient Position: Sitting, Cuff Size: Large)   Pulse 68   Ht 5\' 4"  (1.626 m)   Wt 275 lb 12.8 oz (125.1 kg)   BMI 47.34 kg/m   Wt Readings from Last 3 Encounters:  05/18/23 275 lb 12.8 oz (125.1 kg)  03/07/23 274 lb (124.3 kg)  02/06/23 273 lb 3.2 oz (123.9 kg)    BP Readings from Last 3 Encounters:  05/18/23 108/65  03/07/23 125/76  02/06/23 124/72     Physical Exam- Limited  Constitutional:  Body mass index is 47.34 kg/m. , not  in acute distress, normal state of mind Eyes:  EOMI, no exophthalmos Musculoskeletal: no gross deformities, strength intact in all four extremities, no gross restriction of joint movements, walks with cane due to disequilibrium and hx of falls Skin:  no rashes, no hyperemia Neurological: no tremor with outstretched hands   CMP ( most recent) CMP     Component Value Date/Time   NA 141 05/14/2023 0926   K 4.4 05/14/2023 0926   CL 101 05/14/2023 0926   CO2 23 05/14/2023 0926   GLUCOSE 114 (H) 05/14/2023 0926   GLUCOSE 316 (H) 02/11/2020 1111   BUN 25 05/14/2023 0926   CREATININE 1.07 (H) 05/14/2023 0926   CREATININE 0.91 02/11/2020 1111   CALCIUM 10.2 05/14/2023 0926   PROT 7.7 05/14/2023 0926   ALBUMIN 4.6 05/14/2023 0926   AST 31 05/14/2023 0926   ALT 26 05/14/2023 0926   ALKPHOS 44 05/14/2023 0926   BILITOT 0.3 05/14/2023 0926   GFRNONAA 70 05/06/2020 1201   GFRNONAA 69 02/11/2020 1111   GFRAA 80 05/06/2020 1201   GFRAA 80 02/11/2020 1111    Diabetic Labs (most recent): Lab Results  Component Value Date   HGBA1C 7.2 (A) 05/18/2023   HGBA1C 7.4 (A) 01/09/2023   HGBA1C 7.8 (A) 09/04/2022   MICROALBUR 101.9 (H) 11/10/2019   MICROALBUR 2.9 10/08/2017   MICROALBUR 33.7 (H) 07/06/2017     Lipid Panel ( most recent) Lipid Panel     Component Value Date/Time   CHOL 118 12/19/2022 1121   TRIG 119 12/19/2022 1121   HDL 38 (L) 12/19/2022 1121   CHOLHDL 3.1 12/19/2022 1121   CHOLHDL 4.0 11/10/2019 1303   VLDL 16 05/06/2018 0335   LDLCALC 58 12/19/2022 1121   LDLCALC 97 11/10/2019 1303      Assessment & Plan:   1) Uncontrolled type 2 diabetes mellitus with hyperglycemia (HCC)  - Angela Lukin has currently uncontrolled symptomatic type 2 DM since  64 years of age.  She presents today with her logs, and meter showing improving glycemic profile.  Her POCT A1c today is 7.2%, improving from last visit of 7.4%.  She admits to overindulging during the holidays but is  getting back on track.  She denies any significant hypoglycemia.  She has been tolerating the [facility] well, no unpleasant SE.  There was a period of time over the last 3 months where she wasn't checking glucose as often as she was supposed to.   Recent labs reviewed.  -her diabetes is complicated by coronary artery disease, CHF, obesity/sedentary life and she remains at a high risk for more acute and chronic complications which include CAD, CVA, CKD, retinopathy, and neuropathy. These are all discussed in detail with her.  The following Lifestyle Medicine recommendations according to American College of Lifestyle Medicine [facility]) were discussed and offered to patient and she agrees to start the journey:  A. Whole Foods, Plant-based  plate comprising of fruits and vegetables, plant-based proteins, whole-grain carbohydrates was discussed in detail with the patient.   A list for source of those nutrients were also provided to the patient.  Patient will use only water or unsweetened tea for hydration. B.  The need to stay away from risky substances including alcohol, smoking; obtaining 7 to 9 hours of restorative sleep, at least 150 minutes of moderate intensity exercise weekly, the importance of healthy social connections,  and stress reduction techniques were discussed. C.  A full color page of  Calorie density of various food groups per pound showing examples of each food groups was provided to the patient.  - Nutritional counseling repeated at each appointment due to patients tendency to fall back in to old habits.  - The patient admits there is a room for improvement in their diet and drink choices. -  Suggestion is made for the patient to avoid simple carbohydrates from their diet including Cakes, Sweet Desserts / Pastries, Ice Cream, Soda (diet and regular), Sweet Tea, Candies, Chips, Cookies, Sweet Pastries, Store Bought Juices, Alcohol in Excess of 1-2 drinks a day, Artificial Sweeteners, Coffee  Creamer, and "Sugar-free" Products. This will help patient to have stable blood glucose profile and potentially avoid unintended weight gain.   - I encouraged the patient to switch to unprocessed or minimally processed complex starch and increased protein intake (animal or plant source), fruits, and vegetables.   - Patient is advised to stick to a routine mealtimes to eat 3 meals a day and avoid unnecessary snacks (to snack only to correct hypoglycemia).  - I have approached her with the following individualized plan to manage diabetes and patient agrees:   -Given her presentation with chronic glycemic burden, she will likely require multiple daily injections of insulin in order for her to achieve control of diabetes to target.  -She is advised to continue her Humalog 75/25 20 units SQ twice daily with breakfast and supper to help prevent hypoglycemia, continue Metformin 500 mg po twice daily with meals.  Will increase her Mounjaro to 10 mg SQ weekly.   -She is encouraged to consistently monitor glucose 3 times daily, before injecting insulin and before bed, and to call the clinic if she has readings less than 70 or greater than 300 for 3 tests in a row.  She could not tolerate the Dexcom G7 (had allergic reaction to adhesive).  - she is warned not to take insulin without proper monitoring per orders.  2) Blood Pressure /Hypertension: Her blood pressure is controlled to target.  She is advised to continue Norvasc 10 mg po daily, Clonidine 0.3 mg patch once weekly, Clonidine 0.3 mg po daily at bedtime, Lasix 40 mg po daily, Spironolactone 25 mg po daily, and Metoprolol 25 mg po twice daily.  3) Lipids/Hyperlipidemia:  Her most recent lipid panel from 12/19/22 shows controlled LDL at 58 (improving).  She could not tolerate the Lipitor due to elevated LFTs and could not tolerate the Repatha either  4)  Weight/Diet:  Her Body mass index is 47.34 kg/m. - clearly complicating her diabetes care.  She  is a candidate for modest weight loss.  She has multiple comorbid situations complicating her chance of bariatric surgery.  I discussed with her the fact that loss of 5 - 10% of her  current body weight will have the most impact on her diabetes management.  CDE Consult will be initiated . Exercise, and detailed carbohydrates information provided  -  detailed  on discharge instructions.  5) Hypothyroidism:  Her previsit TFTs are consistent with appropriate hormone replacement.  She is advised to continue Levothyroxine 75 mcg po daily before breakfast.    - We discussed about the correct intake of her thyroid hormone, on empty stomach at fasting, with water, separated by at least 30 minutes from breakfast and other medications,  and separated by more than 4 hours from calcium, iron, multivitamins, acid reflux medications (PPIs). -Patient is made aware of the fact that thyroid hormone replacement is needed for life, dose to be adjusted by periodic monitoring of thyroid function tests.  6) Chronic Care/Health Maintenance: -she is not on ACE/ARB (intolerant to both) or statin medications (due to elevated LFTs) and is encouraged to initiate and continue to follow up with Ophthalmology, Dentist,  Podiatrist at least yearly or according to recommendations, and advised to  stay away from smoking. I have recommended yearly flu vaccine and pneumonia vaccine at least every 5 years; moderate intensity exercise for up to 150 minutes weekly; and  sleep for at least 7 hours a day.  - she is advised to maintain close follow up with Kerri Perches, MD for primary care needs, as well as her other providers for optimal and coordinated care.     I spent  35  minutes in the care of the patient today including review of labs from CMP, Lipids, Thyroid Function, Hematology (current and previous including abstractions from other facilities); face-to-face time discussing  her blood glucose readings/logs, discussing  hypoglycemia and hyperglycemia episodes and symptoms, medications doses, her options of short and long term treatment based on the latest standards of care / guidelines;  discussion about incorporating lifestyle medicine;  and documenting the encounter. Risk reduction counseling performed per USPSTF guidelines to reduce obesity and cardiovascular risk factors.     Please refer to Patient Instructions for Blood Glucose Monitoring and Insulin/Medications Dosing Guide"  in media tab for additional information. Please  also refer to " Patient Self Inventory" in the Media  tab for reviewed elements of pertinent patient history.  Angela Stehr participated in the discussions, expressed understanding, and voiced agreement with the above plans.  All questions were answered to her satisfaction. she is encouraged to contact clinic should she have any questions or concerns prior to her return visit.   Follow up plan: - Return in about 4 months (around 09/15/2023) for Diabetes F/U with A1c in office, Thyroid follow up, Bring meter and logs.   Ronny Bacon, [facility] [address] [place], [place] [address] Phone: [phone] Fax: [phone]   05/18/2023, 10:35 AM

## 2023-05-18 NOTE — Patient Instructions (Signed)

## 2023-05-30 ENCOUNTER — Ambulatory Visit: Payer: 59 | Admitting: Podiatry

## 2023-05-30 ENCOUNTER — Encounter: Payer: Self-pay | Admitting: Podiatry

## 2023-05-30 VITALS — Ht 64.0 in | Wt 275.0 lb

## 2023-05-30 DIAGNOSIS — M79675 Pain in left toe(s): Secondary | ICD-10-CM | POA: Diagnosis not present

## 2023-05-30 DIAGNOSIS — Q828 Other specified congenital malformations of skin: Secondary | ICD-10-CM

## 2023-05-30 DIAGNOSIS — E1142 Type 2 diabetes mellitus with diabetic polyneuropathy: Secondary | ICD-10-CM

## 2023-05-30 DIAGNOSIS — M79674 Pain in right toe(s): Secondary | ICD-10-CM

## 2023-05-30 DIAGNOSIS — B351 Tinea unguium: Secondary | ICD-10-CM

## 2023-05-30 DIAGNOSIS — L84 Corns and callosities: Secondary | ICD-10-CM

## 2023-05-30 NOTE — Progress Notes (Signed)
Subjective:  Patient ID: Angela Rye, female    DOB: [birth year],  MRN: [id number]  63 y.o. female presents to clinic with  at risk foot care with history of diabetic neuropathy and callus(es) left foot, porokeratotic lesion(s) right foot, and painful mycotic nails. Painful toenails interfere with ambulation. Aggravating factors include wearing enclosed shoe gear. Pain is relieved with periodic professional debridement. Painful callus(es) and porokeratotic lesion(s) are aggravated when weightbearing with and without shoegear. Pain is relieved with periodic professional debridement.  Chief Complaint  Patient presents with   [facility]    She is here for nail trim, last A1C was 7.4, PCP is Dr, Lodema Hong was 03/2023,    New problem(s): None   PCP is Kerri Perches, MD.  Allergies  Allergen Reactions   Latex Itching and Rash   Morphine Other (See Comments)    Reaction with esophagus, unable to swallow.    Morphine And Codeine Shortness Of Breath and Swelling   Other Itching, Shortness Of Breath and Swelling   Ace Inhibitors Cough   Aspirin Other (See Comments)    Reaction with esophagus, unable to swallow.    Losartan Cough   Statins Other (See Comments)    Elevation of liver enzymes   Tapentadol Other (See Comments)    Nausea, increased sleepiness, h/a   Adhesive [Tape] Rash   Tomato Other (See Comments)    Acid reflux due to acid in tomato    Review of Systems: Negative except as noted in the HPI.   Objective:  Angela [name] is a pleasant 63 y.o. female obese in NAD. AAO x 3.  Vascular Examination: Palpable pedal pulses. CFT immediate b/l. Pedal hair present. No edema. No pain with calf compression b/l. Skin temperature gradient WNL b/l. No varicosities noted. No cyanosis or clubbing noted.  Neurological Examination: Pt has subjective symptoms of neuropathy. Protective sensation decreased with 10 gram monofilament b/l.   Dermatological Examination: Pedal skin with normal  turgor, texture and tone b/l. No open wounds nor interdigital macerations noted. Toenails 1-5 b/l thick, discolored, elongated with subungual debris and pain on dorsal palpation. Minimal hyperkeratos(is/es) noted submet head 5 left foot. Porokeratotic lesion(s) right heel. No erythema, no edema, no drainage, no fluctuance.  Musculoskeletal Examination: Muscle strength 5/5 to b/l LE.  No pain, crepitus noted b/l. No gross pedal deformities. Patient ambulates independently without assistive aids.   Radiographs: None  Last A1c:      Latest Ref Rng & Units 05/18/2023    9:41 AM 01/09/2023   11:19 AM 09/04/2022   11:56 AM 06/06/2022    2:18 PM  Hemoglobin A1C  Hemoglobin-A1c 4.0 - 5.6 % 7.2  7.4  7.8  10.4      Assessment:   1. Pain due to onychomycosis of toenails of both feet   2. Porokeratosis   3. Callus   4. Diabetic peripheral neuropathy associated with type 2 diabetes mellitus (HCC)     Plan:  -Consent given for treatment as described below: -Examined patient. -Continue foot and shoe inspections daily. Monitor blood glucose per PCP/Endocrinologist's recommendations. -Mycotic toenails 1-5 bilaterally were debrided in length and girth with sterile nail nippers and dremel without incident. -Callus(es) submet head 5 left foot pared using sharp debridement. Pinpoint bleeding addressed with Lumicain Hemostatic solution. TAO and band-aid applied. She may remove on tomorrow. otal number pared =1. -Porokeratotic lesion(s) right heel pared and enucleated with sterile currette without incident. Total number of lesions debrided=1. -Patient/POA to call should there be question/concern in  the interim.  Return in about 3 months (around 08/28/2023).  Freddie Breech, DPM      [place] LOCATION: [address] [place] [address]                   Office [phone]   [facility] LOCATION: [address] [place], [place]  [address] Office [phone]

## 2023-06-05 ENCOUNTER — Other Ambulatory Visit: Payer: Self-pay | Admitting: Family Medicine

## 2023-06-07 DIAGNOSIS — R0609 Other forms of dyspnea: Secondary | ICD-10-CM | POA: Diagnosis not present

## 2023-06-12 ENCOUNTER — Telehealth: Payer: Self-pay | Admitting: Family Medicine

## 2023-06-12 NOTE — Telephone Encounter (Signed)
Copied from CRM [id number]. Topic: General - Other >> Jun 12, 2023 10:04 AM Eunice Blase wrote: Reason for CRM: Received call from [facility] per Victorino Dike will fax request for order of diazepam (VALIUM) 5 MG tablet.

## 2023-06-13 ENCOUNTER — Other Ambulatory Visit: Payer: Self-pay

## 2023-06-13 MED ORDER — UNABLE TO FIND: 12 refills | Status: AC

## 2023-06-20 ENCOUNTER — Ambulatory Visit (INDEPENDENT_AMBULATORY_CARE_PROVIDER_SITE_OTHER): Payer: 59 | Admitting: Family Medicine

## 2023-06-20 ENCOUNTER — Encounter: Payer: Self-pay | Admitting: Family Medicine

## 2023-06-20 VITALS — BP 135/83 | HR 88 | Ht 64.0 in | Wt 269.0 lb

## 2023-06-20 DIAGNOSIS — I10 Essential (primary) hypertension: Secondary | ICD-10-CM | POA: Diagnosis not present

## 2023-06-20 DIAGNOSIS — R52 Pain, unspecified: Secondary | ICD-10-CM

## 2023-06-20 DIAGNOSIS — R32 Unspecified urinary incontinence: Secondary | ICD-10-CM

## 2023-06-20 DIAGNOSIS — Z0001 Encounter for general adult medical examination with abnormal findings: Secondary | ICD-10-CM

## 2023-06-20 DIAGNOSIS — Z1231 Encounter for screening mammogram for malignant neoplasm of breast: Secondary | ICD-10-CM

## 2023-06-20 DIAGNOSIS — Z0289 Encounter for other administrative examinations: Secondary | ICD-10-CM

## 2023-06-20 DIAGNOSIS — E559 Vitamin D deficiency, unspecified: Secondary | ICD-10-CM | POA: Diagnosis not present

## 2023-06-20 DIAGNOSIS — E785 Hyperlipidemia, unspecified: Secondary | ICD-10-CM

## 2023-06-20 MED ORDER — OXYCODONE HCL ER 40 MG PO T12A
40.0000 mg | EXTENDED_RELEASE_TABLET | Freq: Two times a day (BID) | ORAL | 0 refills | Status: DC
Start: 2023-07-04 — End: 2023-09-25

## 2023-06-20 MED ORDER — REPATHA 140 MG/ML ~~LOC~~ SOSY: 140.0000 mg | PREFILLED_SYRINGE | SUBCUTANEOUS | 11 refills | Status: AC

## 2023-06-20 NOTE — Patient Instructions (Addendum)
 F/U May 24 or after call if you need me sooner  UDS today and pain contract to be signed  CONGRATS on marked improvement in health, keep it up  Please schedule mammogram at checkout  Please schedule wellness at checkout, states had December appt and was not contacted  Lipid, cBC and vit D level today''.  Practice good home safety, wear secure shoes in the home , hopefully , no more falls!  Thanks for choosing [facility], we consider it a privelige to serve you.

## 2023-06-20 NOTE — Assessment & Plan Note (Signed)

## 2023-06-21 ENCOUNTER — Encounter: Payer: Self-pay | Admitting: Family Medicine

## 2023-06-21 LAB — CBC
Hematocrit: 41.1 % (ref 34.0–46.6)
Hemoglobin: 12.9 g/dL (ref 11.1–15.9)
MCH: 26 pg — ABNORMAL LOW (ref 26.6–33.0)
MCHC: 31.4 g/dL — ABNORMAL LOW (ref 31.5–35.7)
MCV: 83 fL (ref 79–97)
Platelets: 556 10*3/uL — ABNORMAL HIGH (ref 150–450)
RBC: 4.97 x10E6/uL (ref 3.77–5.28)
RDW: 13.2 % (ref 11.7–15.4)
WBC: 5.4 10*3/uL (ref 3.4–10.8)

## 2023-06-21 LAB — LIPID PANEL
Chol/HDL Ratio: 3.2 {ratio} (ref 0.0–4.4)
Cholesterol, Total: 142 mg/dL (ref 100–199)
HDL: 44 mg/dL (ref >39)
LDL Chol Calc (NIH): 82 mg/dL (ref 0–99)
Triglycerides: 85 mg/dL (ref 0–149)
VLDL Cholesterol Cal: 16 mg/dL (ref 5–40)

## 2023-06-21 LAB — VITAMIN D 25 HYDROXY (VIT D DEFICIENCY, FRACTURES): Vit D, 25-Hydroxy: 58.2 ng/mL (ref 30.0–100.0)

## 2023-06-22 ENCOUNTER — Other Ambulatory Visit: Payer: Self-pay | Admitting: Family Medicine

## 2023-06-23 LAB — TOXASSURE SELECT 13 (MW), URINE

## 2023-06-24 DIAGNOSIS — Z0001 Encounter for general adult medical examination with abnormal findings: Secondary | ICD-10-CM | POA: Insufficient documentation

## 2023-06-24 MED ORDER — OXYCODONE HCL ER 40 MG PO T12A
EXTENDED_RELEASE_TABLET | ORAL | 0 refills | Status: DC
Start: 2023-08-03 — End: 2023-09-25

## 2023-06-24 MED ORDER — GABAPENTIN 800 MG PO TABS: ORAL_TABLET | ORAL | 5 refills | Status: DC

## 2023-06-24 MED ORDER — OXYCODONE HCL ER 40 MG PO T12A: EXTENDED_RELEASE_TABLET | ORAL | 0 refills | Status: AC

## 2023-06-24 NOTE — Assessment & Plan Note (Signed)
 The patient's Controlled Substance registry is reviewed and compliance confirmed. Adequacy of  Pain control and level of function is assessed. Medication dosing is adjusted as deemed appropriate. Twelve weeks of medication is prescribed , with a follow up appointment between 11 to 12 weeks .

## 2023-06-24 NOTE — Progress Notes (Addendum)
    Angela [name]     MRN: [id number]      DOB: 06/06/1960  Chief Complaint  Patient presents with   Annual Exam    CPE     HPI: Patient is in for annual physical exam. Pain contract is reviewed and updated also Requests incontinence supplies due to inability to hod urine, always wetting on herself, also has limitation in movement which also contributes When excess coughing is occurring this is another time when she has even more difficulty  Recent fall at home, was barefoot and slipped , hard fall, did not seek medical attention, no current residual effects reported  Immunization is reviewed , and  updated if needed.   PE: BP 135/83 (BP Location: Right Arm, Patient Position: Sitting, Cuff Size: Large)   Pulse 88   Ht 5\' 4"  (1.626 m)   Wt 269 lb (122 kg)   SpO2 93%   BMI 46.17 kg/m   Pleasant  female, alert and oriented x 3, in no cardio-pulmonary distress. Afebrile. HEENT No facial trauma or asymetry. Sinuses non tender.  Extra occullar muscles intact.. External ears normal, . Neck: supple, no adenopathy,JVD or thyromegaly.No bruits.  Chest: Clear to ascultation bilaterally.No crackles or wheezes. Non tender to palpation  Cardiovascular system; Heart sounds normal,  S1 and  S2 ,no S3.  No murmur, or thrill. Apical beat not displaced Peripheral pulses normal.  Abdomen: Soft, non tender, no organomegaly or masses. l. No guarding, tenderness or rebound.     Musculoskeletal exam: Decreased ROM of spine, hips , shoulders and knees.  deformity ,swelling and  crepitus noted.of knees  Neurologic: Cranial nerves 2 to 12 intact. Power, tone ,sensation and reflexes normal throughout. No disturbance in gait. No tremor.  Skin: Intact, rash noted. Hyper Pigmentation in lower extremities Psych; Normal mood and affect. Judgement and concentration normal   Assessment & Plan:  Annual visit for general adult medical examination with abnormal findings Annual exam as  documented. Counseling done  re healthy lifestyle involving commitment to 150 minutes exercise per week, heart healthy diet, and attaining healthy weight.The importance of adequate sleep also discussed. Regular seat belt use and home safety, is also discussed. Changes in health habits are decided on by the patient with goals and time frames  set for achieving them. Immunization and cancer screening needs are specifically addressed at this visit.   Encounter for Medicare annual examination with abnormal findings Annual exam as documented. Counseling done  re healthy lifestyle involving commitment to 150 minutes exercise per week, heart healthy diet, and attaining healthy weight.The importance of adequate sleep also discussed. Regular seat belt use and home safety, is also discussed. Changes in health habits are decided on by the patient with goals and time frames  set for achieving them. Immunization and cancer screening needs are specifically addressed at this visit.   Encounter for pain management The patient's Controlled Substance registry is reviewed and compliance confirmed. Adequacy of  Pain control and level of function is assessed. Medication dosing is adjusted as deemed appropriate. Twelve weeks of medication is prescribed , with a follow up appointment between 11 to 12 weeks .   Pain management contract signed Contract reviewed and signed Risk stratification for opioid abuse completed, low risk UDS sent   Urinary incontinence Reports wetting on herself due to poor mobility, and urge incontinence all the time, also wets on herself with cough Needs incontinence pads, gloves, wipes and chux

## 2023-06-24 NOTE — Assessment & Plan Note (Signed)
 Contract reviewed and signed Risk stratification for opioid abuse completed, low risk UDS sent

## 2023-06-24 NOTE — Assessment & Plan Note (Signed)

## 2023-06-29 ENCOUNTER — Other Ambulatory Visit: Payer: Self-pay | Admitting: Pulmonary Disease

## 2023-07-05 DIAGNOSIS — R0609 Other forms of dyspnea: Secondary | ICD-10-CM | POA: Diagnosis not present

## 2023-07-05 NOTE — Telephone Encounter (Unsigned)
 Copied from CRM [id number]. Topic: Clinical - Medical Advice >> Jul 04, 2023 12:14 PM Priscille Loveless wrote: Reason for CRM: Cherlynn from [facility] called about the diazepam. She advised pt also take opioids and suggests that pt should take another medication.

## 2023-07-10 ENCOUNTER — Telehealth: Payer: Self-pay | Admitting: Family Medicine

## 2023-07-10 MED ORDER — DIAZEPAM 5 MG PO TABS: ORAL_TABLET | ORAL | 5 refills | Status: DC

## 2023-07-10 MED ORDER — IPRATROPIUM-ALBUTEROL 0.5-2.5 (3) MG/3ML IN SOLN: 3.0000 mL | Freq: Four times a day (QID) | RESPIRATORY_TRACT | 3 refills | Status: AC | PRN

## 2023-07-10 NOTE — Addendum Note (Signed)
 Addended by: Syliva Overman E on: 07/10/2023 12:58 PM   Modules accepted: Orders

## 2023-07-10 NOTE — Assessment & Plan Note (Addendum)
 Reports wetting on herself due to poor mobility, and urge incontinence all the time, also wets on herself with cough Needs incontinence pads, gloves, wipes and chux

## 2023-07-10 NOTE — Telephone Encounter (Signed)
 Will fax ov notes as requested

## 2023-07-10 NOTE — Telephone Encounter (Signed)
 [facility] is needing ov notes addended from her last appt that support the need for incontinence supplies for insurance to cover. Fax to [phone]

## 2023-07-11 LAB — HM DIABETES EYE EXAM

## 2023-07-12 ENCOUNTER — Other Ambulatory Visit: Payer: Self-pay | Admitting: Family Medicine

## 2023-07-12 DIAGNOSIS — I10 Essential (primary) hypertension: Secondary | ICD-10-CM

## 2023-07-13 ENCOUNTER — Other Ambulatory Visit: Payer: Self-pay | Admitting: Family Medicine

## 2023-07-28 ENCOUNTER — Other Ambulatory Visit: Payer: Self-pay | Admitting: Family Medicine

## 2023-07-28 DIAGNOSIS — I10 Essential (primary) hypertension: Secondary | ICD-10-CM

## 2023-08-05 DIAGNOSIS — R0609 Other forms of dyspnea: Secondary | ICD-10-CM | POA: Diagnosis not present

## 2023-08-22 ENCOUNTER — Other Ambulatory Visit: Payer: Self-pay | Admitting: Family Medicine

## 2023-08-22 DIAGNOSIS — R29898 Other symptoms and signs involving the musculoskeletal system: Secondary | ICD-10-CM

## 2023-08-22 MED ORDER — UNABLE TO FIND: 0 refills | Status: AC

## 2023-08-23 ENCOUNTER — Other Ambulatory Visit: Payer: Self-pay | Admitting: Family Medicine

## 2023-08-24 ENCOUNTER — Other Ambulatory Visit: Payer: Self-pay | Admitting: Nurse Practitioner

## 2023-08-31 ENCOUNTER — Other Ambulatory Visit: Payer: Self-pay | Admitting: Family Medicine

## 2023-09-01 ENCOUNTER — Other Ambulatory Visit: Payer: Self-pay | Admitting: Family Medicine

## 2023-09-04 DIAGNOSIS — R0609 Other forms of dyspnea: Secondary | ICD-10-CM | POA: Diagnosis not present

## 2023-09-19 ENCOUNTER — Other Ambulatory Visit: Payer: Self-pay

## 2023-09-19 ENCOUNTER — Ambulatory Visit (INDEPENDENT_AMBULATORY_CARE_PROVIDER_SITE_OTHER): Payer: 59 | Admitting: Podiatry

## 2023-09-19 ENCOUNTER — Encounter: Payer: Self-pay | Admitting: Podiatry

## 2023-09-19 DIAGNOSIS — B351 Tinea unguium: Secondary | ICD-10-CM

## 2023-09-19 DIAGNOSIS — E1142 Type 2 diabetes mellitus with diabetic polyneuropathy: Secondary | ICD-10-CM

## 2023-09-19 DIAGNOSIS — M5116 Intervertebral disc disorders with radiculopathy, lumbar region: Secondary | ICD-10-CM

## 2023-09-19 DIAGNOSIS — Q828 Other specified congenital malformations of skin: Secondary | ICD-10-CM

## 2023-09-19 DIAGNOSIS — M79674 Pain in right toe(s): Secondary | ICD-10-CM

## 2023-09-19 DIAGNOSIS — L84 Corns and callosities: Secondary | ICD-10-CM

## 2023-09-19 DIAGNOSIS — M79675 Pain in left toe(s): Secondary | ICD-10-CM | POA: Diagnosis not present

## 2023-09-19 DIAGNOSIS — R29898 Other symptoms and signs involving the musculoskeletal system: Secondary | ICD-10-CM

## 2023-09-19 MED ORDER — UNABLE TO FIND: 0 refills | Status: AC

## 2023-09-19 NOTE — Progress Notes (Unsigned)
  Subjective:  [name], female    DOB: 16-Sep-1960,  MRN: [id number]  Angela [name] presents to clinic today for {jgcomplaint:23593}  Chief Complaint  Patient presents with   Nail Problem    Pt presents for routine foot care   New problem(s): None. {jgcomplaint:23593}  PCP is Towanda Fret, MD.  Allergies  Allergen Reactions   Latex Itching and Rash   Morphine  Other (See Comments)    Reaction with esophagus, unable to swallow.    Morphine  And Codeine Shortness Of Breath and Swelling   Other Itching, Shortness Of Breath and Swelling   Ace Inhibitors Cough   Aspirin Other (See Comments)    Reaction with esophagus, unable to swallow.    Losartan Cough   Statins Other (See Comments)    Elevation of liver enzymes   Tapentadol  Other (See Comments)    Nausea, increased sleepiness, h/a   Adhesive [Tape] Rash   Tomato Other (See Comments)    Acid reflux due to acid in tomato    Review of Systems: Negative except as noted in the HPI.  Objective: No changes noted in today's physical examination. There were no vitals filed for this visit. Angela [name] is a pleasant 63 y.o. female {jgbodyhabitus:24098} AAO x 3.  Vascular Examination: Palpable pedal pulses. CFT immediate b/l. Pedal hair present. No edema. No pain with calf compression b/l. Skin temperature gradient WNL b/l. No varicosities noted. No cyanosis or clubbing noted.  Neurological Examination: Pt has subjective symptoms of neuropathy. Protective sensation decreased with 10 gram monofilament b/l.   Dermatological Examination: Pedal skin with normal turgor, texture and tone b/l. No open wounds nor interdigital macerations noted. Toenails 1-5 b/l thick, discolored, elongated with subungual debris and pain on dorsal palpation. Minimal hyperkeratos(is/es) noted submet head 5 left foot. Porokeratotic lesion(s) right heel. No erythema, no edema, no drainage, no fluctuance.  Musculoskeletal  Examination: Muscle strength 5/5 to b/l LE.  No pain, crepitus noted b/l. No gross pedal deformities. Patient ambulates independently without assistive aids.   Radiographs: None  Assessment/Plan: 1. Pain due to onychomycosis of toenails of both feet   2. Callus   3. Porokeratosis   4. Diabetic peripheral neuropathy associated with type 2 diabetes mellitus (HCC)     {Jgplan:23602::"-Patient/POA to call should there be question/concern in the interim."}   Return in about 3 months (around 12/20/2023).  Angela [name], DPM      [place] LOCATION: [address] [place] [address]                   Office [phone]   [facility] LOCATION: [address] [place], [place] [address] Office [phone]

## 2023-09-20 ENCOUNTER — Other Ambulatory Visit: Payer: Self-pay | Admitting: Family Medicine

## 2023-09-21 ENCOUNTER — Ambulatory Visit: Payer: 59 | Admitting: Nurse Practitioner

## 2023-09-25 ENCOUNTER — Ambulatory Visit (INDEPENDENT_AMBULATORY_CARE_PROVIDER_SITE_OTHER): Admitting: Nurse Practitioner

## 2023-09-25 ENCOUNTER — Encounter: Payer: Self-pay | Admitting: Family Medicine

## 2023-09-25 ENCOUNTER — Encounter: Payer: Self-pay | Admitting: Nurse Practitioner

## 2023-09-25 ENCOUNTER — Ambulatory Visit: Payer: 59 | Admitting: Family Medicine

## 2023-09-25 VITALS — BP 132/74 | HR 66 | Ht 64.0 in | Wt 274.0 lb

## 2023-09-25 VITALS — BP 134/83 | HR 76 | Resp 18 | Ht 64.0 in | Wt 272.1 lb

## 2023-09-25 DIAGNOSIS — Z7985 Long-term (current) use of injectable non-insulin antidiabetic drugs: Secondary | ICD-10-CM

## 2023-09-25 DIAGNOSIS — E039 Hypothyroidism, unspecified: Secondary | ICD-10-CM

## 2023-09-25 DIAGNOSIS — R52 Pain, unspecified: Secondary | ICD-10-CM

## 2023-09-25 DIAGNOSIS — F5104 Psychophysiologic insomnia: Secondary | ICD-10-CM

## 2023-09-25 DIAGNOSIS — Z7984 Long term (current) use of oral hypoglycemic drugs: Secondary | ICD-10-CM

## 2023-09-25 DIAGNOSIS — E782 Mixed hyperlipidemia: Secondary | ICD-10-CM

## 2023-09-25 DIAGNOSIS — Z794 Long term (current) use of insulin: Secondary | ICD-10-CM

## 2023-09-25 DIAGNOSIS — E1159 Type 2 diabetes mellitus with other circulatory complications: Secondary | ICD-10-CM | POA: Diagnosis not present

## 2023-09-25 DIAGNOSIS — I10 Essential (primary) hypertension: Secondary | ICD-10-CM

## 2023-09-25 DIAGNOSIS — E1165 Type 2 diabetes mellitus with hyperglycemia: Secondary | ICD-10-CM

## 2023-09-25 DIAGNOSIS — E559 Vitamin D deficiency, unspecified: Secondary | ICD-10-CM

## 2023-09-25 DIAGNOSIS — E785 Hyperlipidemia, unspecified: Secondary | ICD-10-CM

## 2023-09-25 DIAGNOSIS — M5116 Intervertebral disc disorders with radiculopathy, lumbar region: Secondary | ICD-10-CM | POA: Diagnosis not present

## 2023-09-25 LAB — POCT GLYCOSYLATED HEMOGLOBIN (HGB A1C): Hemoglobin A1C: 6.9 % — AB (ref 4.0–5.6)

## 2023-09-25 MED ORDER — LEVOTHYROXINE SODIUM 75 MCG PO TABS: 75.0000 ug | ORAL_TABLET | Freq: Every day | ORAL | 3 refills | Status: DC

## 2023-09-25 MED ORDER — OXYCODONE HCL ER 40 MG PO T12A: EXTENDED_RELEASE_TABLET | ORAL | 0 refills | Status: AC

## 2023-09-25 MED ORDER — TIRZEPATIDE 12.5 MG/0.5ML ~~LOC~~ SOAJ
12.5000 mg | SUBCUTANEOUS | 3 refills | Status: DC
Start: 2023-09-25 — End: 2024-01-11

## 2023-09-25 MED ORDER — ONETOUCH DELICA PLUS LANCET33G MISC
3 refills | Status: DC
Start: 2023-09-25 — End: 2024-03-03

## 2023-09-25 MED ORDER — ONETOUCH VERIO VI STRP: ORAL_STRIP | 2 refills | Status: AC

## 2023-09-25 MED ORDER — ZOLPIDEM TARTRATE 10 MG PO TABS: 10.0000 mg | ORAL_TABLET | Freq: Every day | ORAL | 5 refills | Status: DC

## 2023-09-25 MED ORDER — OXYCODONE HCL ER 40 MG PO T12A: 40.0000 mg | EXTENDED_RELEASE_TABLET | Freq: Two times a day (BID) | ORAL | 0 refills | Status: AC

## 2023-09-25 NOTE — Assessment & Plan Note (Signed)
Sleep hygiene reviewed and written information offered also. Prescription sent for  medication needed. Controlled, no change in medication  

## 2023-09-25 NOTE — Assessment & Plan Note (Signed)
  Patient re-educated about  the importance of commitment to a  minimum of 150 minutes of exercise per week as able.  The importance of healthy food choices with portion control discussed, as well as eating regularly and within a 12 hour window most days. The need to choose "clean , green" food 50 to 75% of the time is discussed, as well as to make water  the primary drink and set a goal of 64 ounces water  daily.       09/25/2023    9:46 AM 09/25/2023    8:50 AM 06/20/2023    8:40 AM  Weight /BMI  Weight 274 lb 272 lb 1.3 oz 269 lb  Height 5\' 4"  (1.626 m) 5\' 4"  (1.626 m) 5\' 4"  (1.626 m)  BMI 47.03 kg/m2 46.7 kg/m2 46.17 kg/m2

## 2023-09-25 NOTE — Assessment & Plan Note (Signed)
 Hyperlipidemia:Low fat diet discussed and encouraged.   Lipid Panel  Lab Results  Component Value Date   CHOL 142 06/20/2023   HDL 44 06/20/2023   LDLCALC 82 06/20/2023   TRIG 85 06/20/2023   CHOLHDL 3.2 06/20/2023     Updated lab needed at/ before next visit.

## 2023-09-25 NOTE — Progress Notes (Addendum)
 Angela [name]     MRN: [id number]      DOB: 04-26-1961  Chief Complaint  Patient presents with   Hypertension    3 month follow up     HPI Angela [name] is here for follow up and re-evaluation of chronic medical conditions, medication management and review of any available recent lab and radiology data.  Preventive health is updated, specifically  Cancer screening and Immunization.   Questions or concerns regarding consultations or procedures which the PT has had in the interim are  addressed.has seen podiatry and will be fitted with appropriate shoes Requests clawed cane Needs med refills sent in Blood sugar this am  112 Denies polyuria, polydipsia, blurred vision , or hypoglycemic episodes.  Requests clawed cane due to unsteady gait and severe arthritis in spine   ROS Denies recent fever or chills. Denies sinus pressure, nasal congestion, ear pain or sore throat. Denies chest congestion, productive cough or wheezing. Denies chest pains, palpitations and leg swelling Denies abdominal pain, nausea, vomiting,diarrhea or constipation.   Denies dysuria, frequency, hesitancy or incontinence. Denies headaches, seizures, numbness, or tingling. Denies uncontrolled depression, anxiety or insomnia. Denies skin break down or rash.   PE  BP 134/83   Pulse 76   Resp 18   Ht 5\' 4"  (1.626 m)   Wt 272 lb 1.3 oz (123.4 kg)   SpO2 94%   BMI 46.70 kg/m   Patient alert and oriented and in no cardiopulmonary distress.  HEENT: No facial asymmetry, EOMI,     Neck supple .  Chest: Clear to auscultation bilaterally.  CVS: S1, S2 no murmurs, no S3.Regular rate.  ABD: Soft non tender.   Ext: No edema  WU:[id number]  ROM spine, shoulders, hips and knees.  Skin: Intact, no ulcerations or rash noted.  Psych: Good eye contact, normal affect. Memory intact not anxious or depressed appearing.  CNS: CN 2-12 intact, power,  normal throughout.no focal deficits noted.   Assessment &  Plan  Essential hypertension, benign Controlled, no change in medication DASH diet and commitment to daily physical activity for a minimum of 30 minutes discussed and encouraged, as a part of hypertension management. The importance of attaining a healthy weight is also discussed.     09/25/2023    8:50 AM 06/20/2023    8:40 AM 05/30/2023    8:22 AM 05/18/2023    9:24 AM 03/07/2023   10:44 AM 02/06/2023    8:22 AM 01/09/2023   11:02 AM  BP/Weight  Systolic BP 134 135  108 125 124 110  Diastolic BP 83 83  65 76 72 68  Wt. (Lbs) 272.08 269 275 275.8 274 273.2 277.8  BMI 46.7 kg/m2 46.17 kg/m2 47.2 kg/m2 47.34 kg/m2 47.03 kg/m2 46.89 kg/m2 47.68 kg/m2       Hyperlipidemia LDL goal <100 Hyperlipidemia:Low fat diet discussed and encouraged.   Lipid Panel  Lab Results  Component Value Date   CHOL 142 06/20/2023   HDL 44 06/20/2023   LDLCALC 82 06/20/2023   TRIG 85 06/20/2023   CHOLHDL 3.2 06/20/2023     Updated lab needed at/ before next visit.   Insomnia Sleep hygiene reviewed and written information offered also. Prescription sent for  medication needed. Controlled, no change in medication   Encounter for pain management The patient's Controlled Substance registry is reviewed and compliance confirmed. Adequacy of  Pain control and level of function is assessed. Medication dosing is adjusted as deemed appropriate. Twelve weeks of medication is prescribed ,  with a follow up appointment between 11 to 12 weeks .   Lumbar disc disease with radiculopathy Chronic paion with difficulty ambulating and increased fall risk, needs clawed cane for stability and safety  Uncontrolled type 2 diabetes mellitus with hyperglycemia (HCC) Diabetes associated with hypertension, hyperlipidemia, obesity, and arthritis  Angela [name] is reminded of the importance of commitment to daily physical activity for 30 minutes or more, as able and the need to limit carbohydrate intake to 30 to 60 grams  per meal to help with blood sugar control.   The need to take medication as prescribed, test blood sugar as directed, and to call between visits if there is a concern that blood sugar is uncontrolled is also discussed.  Improving and managed by Endo  Angela [name] is reminded of the importance of daily foot exam, annual eye examination, and good blood sugar, blood pressure and cholesterol control.     Latest Ref Rng & Units 06/20/2023    9:43 AM 05/18/2023    9:41 AM 05/14/2023    9:26 AM 03/09/2023    9:45 AM 01/09/2023   11:19 AM  Diabetic Labs  HbA1c 4.0 - 5.6 %  7.2    7.4   Micro/Creat Ratio 0 - 29 mg/g creat    9    Chol 100 - 199 mg/dL 161       HDL >09 mg/dL 44       Calc LDL 0 - 99 mg/dL 82       Triglycerides 0 - 149 mg/dL 85       Creatinine 6.04 - 1.00 mg/dL   5.40         9/81/1914    9:46 AM 09/25/2023    8:50 AM 06/20/2023    8:40 AM 05/30/2023    8:22 AM 05/18/2023    9:24 AM 03/07/2023   10:44 AM 02/06/2023    8:22 AM  BP/Weight  Systolic BP 132 134 135  108 125 124  Diastolic BP 74 83 83  65 76 72  Wt. (Lbs) 274 272.08 269 275 275.8 274 273.2  BMI 47.03 kg/m2 46.7 kg/m2 46.17 kg/m2 47.2 kg/m2 47.34 kg/m2 47.03 kg/m2 46.89 kg/m2      Latest Ref Rng & Units 07/11/2023   12:00 AM 05/10/2022   12:00 AM  Foot/eye exam completion dates  Eye Exam No Retinopathy No Retinopathy     No Retinopathy      This result is from an external source.        Morbid obesity (HCC)  Patient re-educated about  the importance of commitment to a  minimum of 150 minutes of exercise per week as able.  The importance of healthy food choices with portion control discussed, as well as eating regularly and within a 12 hour window most days. The need to choose "clean , green" food 50 to 75% of the time is discussed, as well as to make water  the primary drink and set a goal of 64 ounces water  daily.       09/25/2023    9:46 AM 09/25/2023    8:50 AM 06/20/2023    8:40 AM  Weight /BMI   Weight 274 lb 272 lb 1.3 oz 269 lb  Height 5\' 4"  (1.626 m) 5\' 4"  (1.626 m) 5\' 4"  (1.626 m)  BMI 47.03 kg/m2 46.7 kg/m2 46.17 kg/m2      Depression Controlled, no change in medication

## 2023-09-25 NOTE — Assessment & Plan Note (Signed)
 Diabetes associated with hypertension, hyperlipidemia, obesity, and arthritis  Angela [name] is reminded of the importance of commitment to daily physical activity for 30 minutes or more, as able and the need to limit carbohydrate intake to 30 to 60 grams per meal to help with blood sugar control.   The need to take medication as prescribed, test blood sugar as directed, and to call between visits if there is a concern that blood sugar is uncontrolled is also discussed.  Improving and managed by Endo  Angela [name] is reminded of the importance of daily foot exam, annual eye examination, and good blood sugar, blood pressure and cholesterol control.     Latest Ref Rng & Units 06/20/2023    9:43 AM 05/18/2023    9:41 AM 05/14/2023    9:26 AM 03/09/2023    9:45 AM 01/09/2023   11:19 AM  Diabetic Labs  HbA1c 4.0 - 5.6 %  7.2    7.4   Micro/Creat Ratio 0 - 29 mg/g creat    9    Chol 100 - 199 mg/dL 161       HDL >09 mg/dL 44       Calc LDL 0 - 99 mg/dL 82       Triglycerides 0 - 149 mg/dL 85       Creatinine 6.04 - 1.00 mg/dL   5.40         9/81/1914    9:46 AM 09/25/2023    8:50 AM 06/20/2023    8:40 AM 05/30/2023    8:22 AM 05/18/2023    9:24 AM 03/07/2023   10:44 AM 02/06/2023    8:22 AM  BP/Weight  Systolic BP 132 134 135  108 125 124  Diastolic BP 74 83 83  65 76 72  Wt. (Lbs) 274 272.08 269 275 275.8 274 273.2  BMI 47.03 kg/m2 46.7 kg/m2 46.17 kg/m2 47.2 kg/m2 47.34 kg/m2 47.03 kg/m2 46.89 kg/m2      Latest Ref Rng & Units 07/11/2023   12:00 AM 05/10/2022   12:00 AM  Foot/eye exam completion dates  Eye Exam No Retinopathy No Retinopathy     No Retinopathy      This result is from an external source.

## 2023-09-25 NOTE — Progress Notes (Signed)
 09/25/2023, 10:13 AM                    Endocrinology follow-up note   Subjective:    [name], female    DOB: December 22, 1960.  Angela [name] is being seen in follow-up in the management of currently uncontrolled symptomatic type 2 diabetes.  She also has comorbid dyslipidemia, hypertension.   PMD:  Towanda Fret, MD.   Past Medical History:  Diagnosis Date   Allergy    Phreesia 06/02/2020   Anxiety    Arthritis    Asthma    Asthma    Phreesia 04/12/2020   Breast mass    left (monitor)   CHF (congestive heart failure) (HCC)    Phreesia 04/12/2020   Coronary atherosclerosis of native coronary artery    Mild nonobstructive CAD by cardiac catheterization 2008 - Dr. Glena Landau   Degenerative disc disease    Depression    Depression    Phreesia 04/12/2020   Diabetes mellitus without complication (HCC)    Phreesia 04/12/2020   Dysrhythmia    Emphysema of lung (HCC)    Essential hypertension, benign    Folliculitis 01/21/2015   GERD (gastroesophageal reflux disease)    H/O hiatal hernia    Helicobacter pylori gastritis 2008   Hypercholesterolemia    Hypertension    Hyperthyroidism    s/p radiation   Left-sided epistaxis 05/15/2018   Low back pain    Migraine    Myocardial infarction (HCC)    Nephrolithiasis    Recurring episodes since 2004   Neuromuscular disorder (HCC)    Phreesia 04/12/2020   Osteoporosis    Oxygen  deficiency    Severe obesity (BMI >= 40) (HCC)    Sleep apnea    STOP BANG SCORE 6no cpap used   Thyroid  disease    Phreesia 04/12/2020   Type 2 diabetes mellitus with diabetic neuropathy (HCC)     Past Surgical History:  Procedure Laterality Date   ABDOMINAL HYSTERECTOMY     BACK SURGERY  1993   BACK SURGERY  1994   BACK SURGERY  2002   BACK SURGERY  2011   [facility]    CARPAL TUNNEL RELEASE Left    CHOLECYSTECTOMY  1996   COLONOSCOPY  2000 [facility]   INT HEMORRHOIDS/FISSURE   COLONOSCOPY  2003  [facility], CHANGE IN BOWEL HABITS   INT HEMORRHOIDS   COLONOSCOPY  2006 [facility]   INT HEMORRHOIDS   COLONOSCOPY  2007 [facility]   INT HEMORRHOIDS   CYSTOSCOPY WITH STENT PLACEMENT Left 07/25/2012   Procedure: CYSTOSCOPY, left retograde pyelogram WITH left ureteral  STENT PLACEMENT;  Surgeon: Alanson Alliance, MD;  Location: [facility];  Service: Urology;  Laterality: Left;   CYSTOSCOPY/RETROGRADE/URETEROSCOPY  12/04/2011   Procedure: CYSTOSCOPY/RETROGRADE/URETEROSCOPY;  Surgeon: Soledad Dupes, MD;  Location: [facility];  Service: Urology;  Laterality: Right;   CYSTOSCOPY/RETROGRADE/URETEROSCOPY/STONE EXTRACTION WITH BASKET Left 09/03/2012   Procedure: CYSTOSCOPY/RETROGRADE pyelogram/digital URETEROSCOPY/STONE EXTRACTION WITH BASKET, left stent removal;  Surgeon: Soledad Dupes, MD;  Location: [facility];  Service: Urology;  Laterality: Left;   ESOPHAGOGASTRODUODENOSCOPY  2008   [id number]:[id number] hiatal hernia./Normal esophagus without evidence of Barrett's mass, stricture, erosion or ulceration./Normal duodenal bulb and second portion of the duodenum./Diffuse erythema in the body and the antrum with occasional erosion.  Biopsies obtained via cold forceps to evaluate for H. pylori gastritis   FRACTURE SURGERY  1999   right clavicle   HERNIA REPAIR  HOLMIUM LASER APPLICATION Left 09/03/2012   Procedure: HOLMIUM LASER APPLICATION;  Surgeon: Soledad Dupes, MD;  Location: [facility];  Service: Urology;  Laterality: Left;   KNEE ARTHROSCOPY  10/04/2010   right knee arthroscopy, dr Hazeline Lister   LEFT HEART CATH AND CORONARY ANGIOGRAPHY N/A 05/08/2018   Procedure: LEFT HEART CATH AND CORONARY ANGIOGRAPHY;  Surgeon: Millicent Ally, MD;  Location: [facility];  Service: Cardiovascular;  Laterality: N/A;   left knee arthroscopic surgery  1999   Left salphingectomy secondary to ectopic pregnancy  1991   PARTIAL HYSTERECTOMY  1991   RIGHT HEART CATH N/A 07/07/2019   Procedure: RIGHT HEART CATH;   Surgeon: Arleen Lacer, MD;  Location: [facility];  Service: Cardiovascular;  Laterality: N/A;   rotary cuff  Right 05/14/2014   [facility]   SPINE SURGERY N/A    Phreesia 06/02/2020   UPPER GASTROINTESTINAL ENDOSCOPY  2008 abd pain   H. pylori gastritis   UPPER GASTROINTESTINAL ENDOSCOPY  [place] [facility] [place]   [facility]    Social History   Socioeconomic History   Marital status: Single    Spouse name: Not on file   Number of children: 1   Years of education: 12   Highest education level: Some college, no degree  Occupational History   Occupation: Disable   Tobacco Use   Smoking status: Never   Smokeless tobacco: Never  Vaping Use   Vaping status: Never Used  Substance and Sexual Activity   Alcohol use: No   Drug use: No   Sexual activity: Not Currently    Partners: Male    Birth control/protection: Surgical    Comment: hyst  Other Topics Concern   Not on file  Social History Narrative   Lives alone    Social Drivers of Health   Financial Resource Strain: Low Risk  (06/16/2023)   Overall Financial Resource Strain (CARDIA)    Difficulty of Paying Living Expenses: Not hard at all  Food Insecurity: No Food Insecurity (06/16/2023)   Hunger Vital Sign    Worried About Running Out of Food in the Last Year: Never true    Ran Out of Food in the Last Year: Never true  Transportation Needs: No Transportation Needs (06/16/2023)   PRAPARE - Administrator, Civil Service (Medical): No    Lack of Transportation (Non-Medical): No  Physical Activity: Insufficiently Active (06/16/2023)   Exercise Vital Sign    Days of Exercise per Week: 3 days    Minutes of Exercise per Session: 30 min  Stress: Stress Concern Present (06/16/2023)   [employer] of Occupational Health - Occupational Stress Questionnaire    Feeling of Stress : Very much  Social Connections: Moderately Integrated (06/16/2023)   Social Connection and Isolation Panel [NHANES]    Frequency of  Communication with Friends and Family: Three times a week    Frequency of Social Gatherings with Friends and Family: Once a week    Attends Religious Services: 1 to 4 times per year    Active Member of [employer] or Organizations: Yes    Attends Banker Meetings: Never    Marital Status: Divorced    Family History  Problem Relation Age of Onset   Heart disease Mother    Hypertension Mother    Cancer Mother        Cervical    Asthma Mother    Heart attack Father    Cancer Father  Prostate   Colon cancer Father        DECEASED AGE 65   Cancer Sister    Colon polyps Neg Hx     Outpatient Encounter Medications as of 09/25/2023  Medication Sig   albuterol  (VENTOLIN  HFA) 108 (90 Base) MCG/ACT inhaler TAKE 2 PUFFS BY MOUTH EVERY 6 HOURS AS NEEDED FOR WHEEZE OR SHORTNESS OF BREATH   amLODipine  (NORVASC ) 10 MG tablet TAKE 1 TABLET BY MOUTH EVERY DAY   B-D UF III MINI PEN NEEDLES 31G X 5 MM MISC Use to inject insulin  twice daily   betamethasone, augmented, (DIPROLENE) 0.05 % lotion Apply 1 application. topically 2 (two) times daily.   blood glucose meter kit and supplies Dispense based on patient and insurance preference. Once daily testing dx. e11.9   calcipotriene (DOVONOX) 0.005 % ointment Apply 1 application topically daily.    clobetasol cream (TEMOVATE) 0.05 % Apply 1 application. topically 2 (two) times daily.   cloNIDine  (CATAPRES  - DOSED IN MG/24 HR) 0.3 mg/24hr patch Apply one patch to skin once weekly   cloNIDine  (CATAPRES ) 0.3 MG tablet TAKE 1 TABLET (0.3 MG TOTAL) BY MOUTH AT BEDTIME.   cyclobenzaprine  (FLEXERIL ) 10 MG tablet TAKE 1 TABLET BY MOUTH THREE TIMES A DAY   diazepam  (VALIUM ) 5 MG tablet Take one tablet by mouth three times daily for back spasm   DULoxetine  (CYMBALTA ) 60 MG capsule TAKE 1 CAPSULE BY MOUTH 2 TIMES DAILY.   esomeprazole  (NEXIUM ) 40 MG capsule TAKE 1 CAPSULE BY MOUTH EVERY DAY   Evolocumab  (REPATHA ) 140 MG/ML SOSY Inject 140 mg into the  skin every 14 (fourteen) days.   fenofibrate  (TRICOR ) 145 MG tablet TAKE 1 TABLET BY MOUTH EVERY DAY   furosemide  (LASIX ) 40 MG tablet TAKE 1 TABLET BY MOUTH EVERY DAY   gabapentin  (NEURONTIN ) 800 MG tablet Take one tablet by mouth four times daily for pain   glucose blood (ONETOUCH VERIO) test strip Use to test blood glucose three times daily as directed   hydrocortisone  (PROCTOSOL HC) 2.5 % rectal cream    ipratropium-albuterol  (DUONEB) 0.5-2.5 (3) MG/3ML SOLN Take 3 mLs by nebulization every 6 (six) hours as needed.   KLOR-CON  M20 20 MEQ tablet TAKE 1 TABLET BY MOUTH TWICE A DAY   Lancets (ONETOUCH DELICA PLUS LANCET33G) MISC USE TO TEST THREE TIMES DAILY. DX : E11.65   loratadine  (CLARITIN ) 10 MG tablet TAKE 1 TABLET BY MOUTH EVERY DAY   metFORMIN  (GLUCOPHAGE ) 500 MG tablet Take 1 tablet (500 mg total) by mouth 2 (two) times daily with a meal.   metoprolol  tartrate (LOPRESSOR ) 25 MG tablet Take 1 tablet (25 mg total) by mouth 2 (two) times daily.   naloxone  (NARCAN ) nasal spray 4 mg/0.1 mL One spray into each nostril once as needed   nitroGLYCERIN  (NITROSTAT ) 0.4 MG SL tablet Place 1 tablet (0.4 mg total) under the tongue every 5 (five) minutes as needed for chest pain.   oxyCODONE  (OXYCONTIN ) 40 mg 12 hr tablet Take 1 tablet (40 mg total) by mouth every 12 (twelve) hours.   oxyCODONE  (OXYCONTIN ) 40 mg 12 hr tablet Take 1 tablet (40 mg total) by mouth every 12 (twelve) hours.   oxyCODONE  (OXYCONTIN ) 40 mg 12 hr tablet Take one tablet by mouth two times daily for pain   oxyCODONE  (OXYCONTIN ) 40 mg 12 hr tablet Take 1 tablet (40 mg total) by mouth every 12 (twelve) hours.   oxyCODONE  (OXYCONTIN ) 40 mg 12 hr tablet Take one tablet by mouth two  times daily for pain   oxyCODONE  (OXYCONTIN ) 40 mg 12 hr tablet Take pone tablet by mouth two times daily for pain   rosuvastatin  (CRESTOR ) 10 MG tablet TAKE ONE TABLET BY MOUTH EVERY MONDAY, WEDNESDAY AND FRIDAY   spironolactone  (ALDACTONE ) 25 MG tablet  TAKE 1 TABLET (25 MG TOTAL) BY MOUTH DAILY.   SYMBICORT  160-4.5 MCG/ACT inhaler Inhale 2 puffs into the lungs in the morning and at bedtime.   tirzepatide  (MOUNJARO ) 12.5 MG/0.5ML Pen Inject 12.5 mg into the skin once a week.   trimethoprim -polymyxin b  (POLYTRIM ) ophthalmic solution PLACE 1 DROP INTO BOTH EYES EVERY 4 (FOUR) HOURS FOR 7 DAYS. MAX 4 DOSES PER DAY.   UNABLE TO FIND BD Ultra-Fine Mini Pen Needle 31 gauge x 3/16"  USE AS DIRECTED TWICE A DAY   UNABLE TO FIND OneTouch Verio test strips  USE AS INSTRUCTED FOUR TIMES DAILY TESTING DX E11.65   UNABLE TO FIND OneTouch Delica Plus Lancet 33 gauge  USE TO TEST ONCE DAILY   UNABLE TO FIND Med Name:  Nutritional supplement 225 units/m or PRN Incontinence pads   264/m or PRN Underpads/Chux     90/m or PRN Nonsterile gloves  1B/M OR PRN   UNABLE TO FIND Med Name: Step stool with handle   UNABLE TO FIND Med Name:  BEDSIDE STEP STOOL WITH HANDLE   UNABLE TO FIND Med Name:  Under pads/CHUX                      90/M OR PRN NUTRITIONAL SUPPLEMENT  225U/M OR PRN INCONTINENCE PADS              264/M OR PRN NONSTERILE GLOVES             1B/M OR PRN WIPES                                         2PKG/M   UNABLE TO FIND Med Name: LIFT CHAIR  DX: R29.898   UNABLE TO FIND Med Name: LIFT CHAIR   Vitamin D , Ergocalciferol , (DRISDOL ) 1.25 MG (50000 UNIT) CAPS capsule TAKE 1 CAPSULE (50,000 UNITS TOTAL) BY MOUTH ONCE A WEEK. ONE CAPSULE ONCE WEEKLY   zolpidem  (AMBIEN ) 10 MG tablet Take 1 tablet (10 mg total) by mouth at bedtime.   [DISCONTINUED] Insulin  Lispro Prot & Lispro (HUMALOG  75/25 MIX) (75-25) 100 UNIT/ML Kwikpen Inject 20 Units into the skin in the morning and at bedtime.   [DISCONTINUED] levothyroxine  (SYNTHROID ) 75 MCG tablet Take 1 tablet (75 mcg total) by mouth daily.   [DISCONTINUED] tirzepatide  (MOUNJARO ) 10 MG/0.5ML Pen Inject 10 mg into the skin once a week.   levothyroxine  (SYNTHROID ) 75 MCG tablet Take 1 tablet (75 mcg total) by  mouth daily.   zolpidem  (AMBIEN ) 10 MG tablet Take 1 tablet (10 mg total) by mouth at bedtime.   zolpidem  (AMBIEN ) 10 MG tablet Take 1 tablet (10 mg total) by mouth at bedtime.   [DISCONTINUED] ezetimibe  (ZETIA ) 10 MG tablet Take 1 tablet (10 mg total) by mouth daily.   [DISCONTINUED] glucose blood (ONETOUCH VERIO) test strip Use to test blood glucose three times daily as directed   [DISCONTINUED] Lancets (ONETOUCH DELICA PLUS LANCET33G) MISC USE TO TEST ONCE DAILY   [DISCONTINUED] zolpidem  (AMBIEN ) 10 MG tablet TAKE 1 TABLET BY MOUTH EVERYDAY AT BEDTIME   No facility-administered encounter medications on file  as of 09/25/2023.    ALLERGIES: Allergies  Allergen Reactions   Latex Itching and Rash   Morphine  Other (See Comments)    Reaction with esophagus, unable to swallow.    Morphine  And Codeine Shortness Of Breath and Swelling   Other Itching, Shortness Of Breath and Swelling   Ace Inhibitors Cough   Aspirin Other (See Comments)    Reaction with esophagus, unable to swallow.    Losartan Cough   Statins Other (See Comments)    Elevation of liver enzymes   Tapentadol  Other (See Comments)    Nausea, increased sleepiness, h/a   Adhesive [Tape] Rash   Tomato Other (See Comments)    Acid reflux due to acid in tomato    VACCINATION STATUS: Immunization History  Administered Date(s) Administered   Influenza Split 03/30/2011   Influenza Whole 03/21/2005, 02/09/2006   Influenza, Seasonal, Injecte, Preservative Fre 03/07/2023   Influenza,inj,Quad PF,6+ Mos 02/11/2014, 07/27/2015, 03/09/2016, 01/29/2017, 02/07/2018, 01/30/2019, 02/11/2020, 03/03/2021, 12/29/2021   Influenza-Unspecified 01/29/2017   PFIZER(Purple Top)SARS-COV-2 Vaccination 07/22/2019, 08/12/2019, 03/05/2020, 11/19/2020   Pneumococcal Polysaccharide-23 04/17/2006, 09/14/2015   Td 04/17/2006   Tdap 12/19/2022   Zoster Recombinant(Shingrix) 09/22/2022, 10/04/2022    Diabetes She presents for her follow-up diabetic  visit. She has type 2 diabetes mellitus. Her disease course has been improving. There are no hypoglycemic associated symptoms. Pertinent negatives for hypoglycemia include no confusion, headaches, pallor or seizures. Associated symptoms include fatigue. Pertinent negatives for diabetes include no blurred vision, no chest pain, no polydipsia, no polyphagia, no polyuria and no weight loss. There are no hypoglycemic complications. Symptoms are improving. Diabetic complications include heart disease, nephropathy and peripheral neuropathy. Risk factors for coronary artery disease include dyslipidemia, diabetes mellitus, family history, hypertension, obesity, sedentary lifestyle, tobacco exposure and post-menopausal. Current diabetic treatment includes insulin  injections and oral agent (monotherapy) (and mounjaro ). She is compliant with treatment most of the time. Her weight is fluctuating minimally. She is following a generally healthy diet. When asked about meal planning, she reported none. She has had a previous visit with a dietitian. She never participates in exercise. Her home blood glucose trend is decreasing steadily. Her breakfast blood glucose range is generally 110-130 mg/dl. Her bedtime blood glucose range is generally 130-140 mg/dl. (She presents today with her logs, and meter showing improving glycemic profile.  Her POCT A1c today is 6.9%, improving from last visit of 7.2%.   She continues to tolerate the Mounjaro  well, no unpleasant SE.  She has been skipping her night time doses of insulin  due to fear of hypoglycemia as readings have been at goal.) An ACE inhibitor/angiotensin II receptor blocker is contraindicated. She does not see a podiatrist.Eye exam is not current.  Hyperlipidemia This is a chronic problem. The current episode started more than 1 year ago. The problem is controlled. Recent lipid tests were reviewed and are normal. Exacerbating diseases include chronic renal disease, diabetes,  hypothyroidism and obesity. Factors aggravating her hyperlipidemia include beta blockers and thiazides. Pertinent negatives include no chest pain, myalgias or shortness of breath. Current antihyperlipidemic treatment includes statins. The current treatment provides mild improvement of lipids. Compliance problems include adherence to diet and adherence to exercise.  Risk factors for coronary artery disease include diabetes mellitus, dyslipidemia, hypertension, obesity and a sedentary lifestyle.  Hypertension This is a chronic problem. The current episode started more than 1 year ago. The problem has been gradually improving since onset. The problem is controlled. Associated symptoms include peripheral edema. Pertinent negatives include no blurred vision,  chest pain, headaches, palpitations or shortness of breath. Agents associated with hypertension include thyroid  hormones. Risk factors for coronary artery disease include family history, dyslipidemia, diabetes mellitus, obesity, sedentary lifestyle and post-menopausal state. Past treatments include calcium  channel blockers, diuretics and beta blockers. The current treatment provides moderate improvement. Compliance problems include diet and exercise.  Hypertensive end-organ damage includes kidney disease, CAD/MI and heart failure. Identifiable causes of hypertension include chronic renal disease and sleep apnea.    Review of systems  Constitutional: +stable body weight,  current Body mass index is 47.03 kg/m. , + fatigue, no subjective hyperthermia, no subjective hypothermia Eyes: no blurry vision, no xerophthalmia ENT: no sore throat, no nodules palpated in throat, no dysphagia/odynophagia, no hoarseness Cardiovascular: no chest pain, no shortness of breath, no palpitations, no leg swelling Respiratory: no cough, no SOB Gastrointestinal: no nausea/vomiting/diarrhea Musculoskeletal: no muscle/joint aches, walks with cane d/t hx of falls Skin: no  rashes, no hyperemia Neurological: no tremors, no numbness, no tingling, no dizziness Psychiatric: no depression, no anxiety,    Objective:    BP 132/74 (BP Location: Right Arm, Patient Position: Sitting, Cuff Size: Large)   Pulse 66   Ht 5\' 4"  (1.626 m)   Wt 274 lb (124.3 kg)   BMI 47.03 kg/m   Wt Readings from Last 3 Encounters:  09/25/23 274 lb (124.3 kg)  09/25/23 272 lb 1.3 oz (123.4 kg)  06/20/23 269 lb (122 kg)    BP Readings from Last 3 Encounters:  09/25/23 132/74  09/25/23 134/83  06/20/23 135/83     Physical Exam- Limited  Constitutional:  Body mass index is 47.03 kg/m. , not in acute distress, normal state of mind Eyes:  EOMI, no exophthalmos Musculoskeletal: no gross deformities, strength intact in all four extremities, no gross restriction of joint movements, walks with cane due to disequilibrium and hx of falls Skin:  no rashes, no hyperemia Neurological: no tremor with outstretched hands   CMP ( most recent) CMP     Component Value Date/Time   NA 141 05/14/2023 0926   K 4.4 05/14/2023 0926   CL 101 05/14/2023 0926   CO2 23 05/14/2023 0926   GLUCOSE 114 (H) 05/14/2023 0926   GLUCOSE 316 (H) 02/11/2020 1111   BUN 25 05/14/2023 0926   CREATININE 1.07 (H) 05/14/2023 0926   CREATININE 0.91 02/11/2020 1111   CALCIUM  10.2 05/14/2023 0926   PROT 7.7 05/14/2023 0926   ALBUMIN 4.6 05/14/2023 0926   AST 31 05/14/2023 0926   ALT 26 05/14/2023 0926   ALKPHOS 44 05/14/2023 0926   BILITOT 0.3 05/14/2023 0926   GFRNONAA 70 05/06/2020 1201   GFRNONAA 69 02/11/2020 1111   GFRAA 80 05/06/2020 1201   GFRAA 80 02/11/2020 1111    Diabetic Labs (most recent): Lab Results  Component Value Date   HGBA1C 6.9 (A) 09/25/2023   HGBA1C 7.2 (A) 05/18/2023   HGBA1C 7.4 (A) 01/09/2023   MICROALBUR 101.9 (H) 11/10/2019   MICROALBUR 2.9 10/08/2017   MICROALBUR 33.7 (H) 07/06/2017     Lipid Panel ( most recent) Lipid Panel     Component Value Date/Time   CHOL  142 06/20/2023 0943   TRIG 85 06/20/2023 0943   HDL 44 06/20/2023 0943   CHOLHDL 3.2 06/20/2023 0943   CHOLHDL 4.0 11/10/2019 1303   VLDL 16 05/06/2018 0335   LDLCALC 82 06/20/2023 0943   LDLCALC 97 11/10/2019 1303      Assessment & Plan:   1) Controlled type 2 diabetes mellitus  with CKD stage 3a (HCC)  - Angela [name] has currently uncontrolled symptomatic type 2 DM since  63 years of age.  She presents today with her logs, and meter showing improving glycemic profile.  Her POCT A1c today is 6.9%, improving from last visit of 7.2%.   She continues to tolerate the Mounjaro  well, no unpleasant SE.  She has been skipping her night time doses of insulin  due to fear of hypoglycemia as readings have been at goal.   Recent labs reviewed.  -her diabetes is complicated by coronary artery disease, CHF, obesity/sedentary life and she remains at a high risk for more acute and chronic complications which include CAD, CVA, CKD, retinopathy, and neuropathy. These are all discussed in detail with her.  The following Lifestyle Medicine recommendations according to American College of Lifestyle Medicine [facility]) were discussed and offered to patient and she agrees to start the journey:  A. Whole Foods, Plant-based plate comprising of fruits and vegetables, plant-based proteins, whole-grain carbohydrates was discussed in detail with the patient.   A list for source of those nutrients were also provided to the patient.  Patient will use only water  or unsweetened tea for hydration. B.  The need to stay away from risky substances including alcohol, smoking; obtaining 7 to 9 hours of restorative sleep, at least 150 minutes of moderate intensity exercise weekly, the importance of healthy social connections,  and stress reduction techniques were discussed. C.  A full color page of  Calorie density of various food groups per pound showing examples of each food groups was provided to the patient.  - Nutritional  counseling repeated at each appointment due to patients tendency to fall back in to old habits.  - The patient admits there is a room for improvement in their diet and drink choices. -  Suggestion is made for the patient to avoid simple carbohydrates from their diet including Cakes, Sweet Desserts / Pastries, Ice Cream, Soda (diet and regular), Sweet Tea, Candies, Chips, Cookies, Sweet Pastries, Store Bought Juices, Alcohol in Excess of 1-2 drinks a day, Artificial Sweeteners, Coffee Creamer, and "Sugar-free" Products. This will help patient to have stable blood glucose profile and potentially avoid unintended weight gain.   - I encouraged the patient to switch to unprocessed or minimally processed complex starch and increased protein intake (animal or plant source), fruits, and vegetables.   - Patient is advised to stick to a routine mealtimes to eat 3 meals a day and avoid unnecessary snacks (to snack only to correct hypoglycemia).  - I have approached her with the following individualized plan to manage diabetes and patient agrees:   -Given her presentation with chronic glycemic burden, she will likely require multiple daily injections of insulin  in order for her to achieve control of diabetes to target.  -She can stop her premixed insulin  today given at goal readings.  Will increase her Mounjaro  to 12.5 mg SQ weekly.  She can also continue Metformin  500 mg po twice daily for now.   -She is encouraged to consistently monitor glucose at least once daily, before breakfast and to call the clinic if she has readings less than 70 or greater than 300 for 3 tests in a row.  She could not tolerate the Dexcom G7 (had allergic reaction to adhesive).  2) Blood Pressure /Hypertension: Her blood pressure is controlled to target.  She is advised to continue her medications as prescribed by PCP.  3) Lipids/Hyperlipidemia:  Her most recent lipid panel from 06/20/23 shows  controlled LDL at 82.  She could not  tolerate the Lipitor due to elevated LFTs and could not tolerate the Repatha  either  4)  Weight/Diet:  Her Body mass index is 47.03 kg/m. - clearly complicating her diabetes care.  She is a candidate for modest weight loss.  She has multiple comorbid situations complicating her chance of bariatric surgery.  I discussed with her the fact that loss of 5 - 10% of her  current body weight will have the most impact on her diabetes management.  CDE Consult will be initiated . Exercise, and detailed carbohydrates information provided  -  detailed on discharge instructions.  5) Hypothyroidism:  There are no recent TFTs to review.  She is advised to continue Levothyroxine  75 mcg po daily before breakfast. Will recheck TFTs prior to next visit and adjust dose accordingly.   - We discussed about the correct intake of her thyroid  hormone, on empty stomach at fasting, with water , separated by at least 30 minutes from breakfast and other medications,  and separated by more than 4 hours from calcium , iron, multivitamins, acid reflux medications (PPIs). -Patient is made aware of the fact that thyroid  hormone replacement is needed for life, dose to be adjusted by periodic monitoring of thyroid  function tests.  6) Chronic Care/Health Maintenance: -she is not on ACE/ARB (intolerant to both) or statin medications (due to elevated LFTs) and is encouraged to initiate and continue to follow up with Ophthalmology, Dentist,  Podiatrist at least yearly or according to recommendations, and advised to  stay away from smoking. I have recommended yearly flu vaccine and pneumonia vaccine at least every 5 years; moderate intensity exercise for up to 150 minutes weekly; and  sleep for at least 7 hours a day.  - she is advised to maintain close follow up with Towanda Fret, MD for primary care needs, as well as her other providers for optimal and coordinated care.     I spent  36  minutes in the care of the patient today  including review of labs from CMP, Lipids, Thyroid  Function, Hematology (current and previous including abstractions from other facilities); face-to-face time discussing  her blood glucose readings/logs, discussing hypoglycemia and hyperglycemia episodes and symptoms, medications doses, her options of short and long term treatment based on the latest standards of care / guidelines;  discussion about incorporating lifestyle medicine;  and documenting the encounter. Risk reduction counseling performed per USPSTF guidelines to reduce obesity and cardiovascular risk factors.     Please refer to Patient Instructions for Blood Glucose Monitoring and Insulin /Medications Dosing Guide"  in media tab for additional information. Please  also refer to " Patient Self Inventory" in the Media  tab for reviewed elements of pertinent patient history.  Angela [name] participated in the discussions, expressed understanding, and voiced agreement with the above plans.  All questions were answered to her satisfaction. she is encouraged to contact clinic should she have any questions or concerns prior to her return visit.   Follow up plan: - Return in about 4 months (around 01/26/2024) for Diabetes F/U with A1c in office, Thyroid  follow up, Previsit labs.   Hulon Magic, [facility] [address] [place], [place] [address] Phone: [phone] Fax: [phone]   09/25/2023, 10:13 AM

## 2023-09-25 NOTE — Assessment & Plan Note (Signed)
 Chronic paion with difficulty ambulating and increased fall risk, needs clawed cane for stability and safety

## 2023-09-25 NOTE — Assessment & Plan Note (Signed)
 Controlled, no change in medication DASH diet and commitment to daily physical activity for a minimum of 30 minutes discussed and encouraged, as a part of hypertension management. The importance of attaining a healthy weight is also discussed.     09/25/2023    8:50 AM 06/20/2023    8:40 AM 05/30/2023    8:22 AM 05/18/2023    9:24 AM 03/07/2023   10:44 AM 02/06/2023    8:22 AM 01/09/2023   11:02 AM  BP/Weight  Systolic BP 134 135  108 125 124 110  Diastolic BP 83 83  65 76 72 68  Wt. (Lbs) 272.08 269 275 275.8 274 273.2 277.8  BMI 46.7 kg/m2 46.17 kg/m2 47.2 kg/m2 47.34 kg/m2 47.03 kg/m2 46.89 kg/m2 47.68 kg/m2

## 2023-09-25 NOTE — Assessment & Plan Note (Signed)
 The patient's Controlled Substance registry is reviewed and compliance confirmed. Adequacy of  Pain control and level of function is assessed. Medication dosing is adjusted as deemed appropriate. Twelve weeks of medication is prescribed , with a follow up appointment between 11 to 12 weeks .

## 2023-09-25 NOTE — Patient Instructions (Addendum)
 F//U in early September, call if you need me sooner  Fasting labs 1 week before next appointment, lipid, cmp and eGFr  No med changes  Think about what you will eat, plan ahead. Choose " clean, green, fresh or frozen" over canned, processed or packaged foods which are more sugary, salty and fatty. 70 to 75% of food eaten should be vegetables and fruit. Three meals at set times with snacks allowed between meals, but they must be fruit or vegetables. Aim to eat over a 12 hour period , example 7 am to 7 pm, and STOP after  your last meal of the day. Drink water ,generally about 64 ounces per day, no other drink is as healthy. Fruit juice is best enjoyed in a healthy way, by EATING the fruit.  It is important that you exercise regularly at least 30 minutes 5 times a week. If you develop chest pain, have severe difficulty breathing, or feel very tired, stop exercising immediately and seek medical attention  Thanks for choosing [facility], we consider it a privelige to serve you.

## 2023-09-25 NOTE — Assessment & Plan Note (Signed)
 Controlled, no change in medication

## 2023-09-26 ENCOUNTER — Other Ambulatory Visit: Payer: Self-pay | Admitting: Family Medicine

## 2023-09-26 ENCOUNTER — Other Ambulatory Visit: Payer: Self-pay

## 2023-09-26 DIAGNOSIS — M51379 Other intervertebral disc degeneration, lumbosacral region without mention of lumbar back pain or lower extremity pain: Secondary | ICD-10-CM

## 2023-09-26 DIAGNOSIS — R269 Unspecified abnormalities of gait and mobility: Secondary | ICD-10-CM

## 2023-09-26 MED ORDER — UNABLE TO FIND: 0 refills | Status: AC

## 2023-10-01 ENCOUNTER — Other Ambulatory Visit: Payer: Self-pay | Admitting: Pulmonary Disease

## 2023-10-01 ENCOUNTER — Other Ambulatory Visit: Payer: Self-pay | Admitting: Family Medicine

## 2023-10-01 ENCOUNTER — Telehealth: Payer: Self-pay

## 2023-10-01 DIAGNOSIS — M62830 Muscle spasm of back: Secondary | ICD-10-CM

## 2023-10-01 NOTE — Telephone Encounter (Signed)
 Copied from CRM [id number]. Topic: Clinical - Prescription Issue >> Oct 01, 2023  1:39 PM Hilton Lucky wrote: Reason for CRM: Patient wants Dr. Alva to prescribe her her fluid pills. Patient is very upset that he is no longer in [place] and wants it to be prescribed without scheduling an appointment. She states that provider has not been prescribing it as he should. Please advise patient on how to move forward with fluid pill prescription, as her PCP and Cardiologist refuse to fill it.   Spoke with pt. Pt has not taken lasix  since Friday (5/30). She has been using ice cubes to reduce swelling. States her right food has fluid in it. I explained to pt she has not been seen within a year and Dr. Villa Greaser would have to approve of rx refill.  Dr. Villa Greaser can you please advise Lasix  refill. Routing to front staff to schedule pt appt. Thank you.

## 2023-10-01 NOTE — Telephone Encounter (Signed)
 Patient has been scheduled with NP in [place] for follow up on June 11th.

## 2023-10-02 ENCOUNTER — Other Ambulatory Visit: Payer: Self-pay | Admitting: Pulmonary Disease

## 2023-10-02 MED ORDER — FUROSEMIDE 40 MG PO TABS: 40.0000 mg | ORAL_TABLET | Freq: Every day | ORAL | 0 refills | Status: DC

## 2023-10-02 NOTE — Addendum Note (Signed)
 Addended by: Michael Ades T on: 10/02/2023 08:45 AM   Modules accepted: Orders

## 2023-10-02 NOTE — Telephone Encounter (Signed)
 Spoke with patient regarding the refill I sent in for her and she understood, reminded her to keep her upcoming f/u appt.

## 2023-10-05 DIAGNOSIS — R0609 Other forms of dyspnea: Secondary | ICD-10-CM | POA: Diagnosis not present

## 2023-10-10 ENCOUNTER — Encounter: Payer: Self-pay | Admitting: Primary Care

## 2023-10-10 ENCOUNTER — Ambulatory Visit: Admitting: Primary Care

## 2023-10-10 ENCOUNTER — Other Ambulatory Visit: Payer: Self-pay | Admitting: Family Medicine

## 2023-10-10 VITALS — BP 157/82 | HR 86 | Ht 64.0 in | Wt 275.4 lb

## 2023-10-10 DIAGNOSIS — G4733 Obstructive sleep apnea (adult) (pediatric): Secondary | ICD-10-CM

## 2023-10-10 DIAGNOSIS — R269 Unspecified abnormalities of gait and mobility: Secondary | ICD-10-CM | POA: Diagnosis not present

## 2023-10-10 DIAGNOSIS — M5137 Other intervertebral disc degeneration, lumbosacral region with discogenic back pain only: Secondary | ICD-10-CM | POA: Diagnosis not present

## 2023-10-10 DIAGNOSIS — R2689 Other abnormalities of gait and mobility: Secondary | ICD-10-CM | POA: Diagnosis not present

## 2023-10-10 MED ORDER — FUROSEMIDE 40 MG PO TABS: 40.0000 mg | ORAL_TABLET | Freq: Every day | ORAL | 0 refills | Status: DC

## 2023-10-10 MED ORDER — SYMBICORT 160-4.5 MCG/ACT IN AERO: 2.0000 | INHALATION_SPRAY | Freq: Two times a day (BID) | RESPIRATORY_TRACT | 12 refills | Status: AC

## 2023-10-10 MED ORDER — ALBUTEROL SULFATE HFA 108 (90 BASE) MCG/ACT IN AERS: 2.0000 | INHALATION_SPRAY | Freq: Four times a day (QID) | RESPIRATORY_TRACT | 6 refills | Status: AC | PRN

## 2023-10-10 NOTE — Progress Notes (Addendum)
 @Patient  ID: Angela [name], female    DOB: Jun 14, 1960, 63 y.o.   MRN: [id number]  No chief complaint on file.   Referring provider: Towanda Fret, MD  HPI:  63  yo female never smoker for follow-up of asthma and  OSA  -on CPAP 10 cm with full facemask , DME - [facility] -on O2 since 2021   Asthma was diagnosed in 2014  She reports onset of intermittent wheezing and a dry cough since her cardiac cath in 05/2018.     PMH -  difficult to control hypertension  on carvedilol , amlodipine  and clonidine , HFpEF chronic opiate use      Chief Complaint  Patient presents with   Follow-up    7 month FU visit Accompanied by her daughter who was a home caregiver. She is somewhat upset that she ran out of fluid pills for a week and it took our office some time to refill it.  She also follows up with cardiology and primary care. She has started on Mounjaro  and this is really helped bring down her HbA1c to 7.8.  She has lost 20 pounds to her current weight of 297.  She states she is to 91 at home. Breathing was bad during pollen season last couple of months but is now improving.  She remains on Symbicort  and uses albuterol  for rescue. No problems with CPAP mask or pressure.  She is very compliant.  She uses oxygen  as needed during the daytime but does not have it blended into her machine at night   Reviewed labs from 5/2 potassium 4.9, BUN/creatinine 13/1.1   10/10/2023- Interim hx  Discussed the use of AI scribe software for clinical note transcription with the patient, who gave verbal consent to proceed.  History of Present Illness   Angela Meter is a 63 year old female with asthma, sleep apnea, and heart failure who presents for management of her CPAP and medication refills.  She uses a CPAP machine consistently, which is over 74 years old, with a pressure setting of ten. Despite this, she experiences apneas at night, snoring, and her apnea score is 1.3. She is planning a  trip to [place]  and is considering how to manage her CPAP machine during travel.  She seeks a refill for her diuretic medication, furosemide , as she feels 'full of fluid' and manages her fluid intake by consuming ice cubes instead of water . She has been having issues getting prescription filled. She is currently on a 40 mg dose of Lasix .  Regarding asthma management, she uses Symbicort  twice daily and has an emergency inhaler for use as needed. She experiences a chronic cough and increased wheezing, which is worse than before. She takes loratadine  for allergies.  She manages her diabetes with Mounjaro  and has reduced her metformin  dose to 500 mg once daily. She has been taken off insulin  and reports significant weight loss from 319 lbs to 272 lbs, with her A1c dropping to 6.9.  She recently moved from [place], [place] , and all her doctors are within the [facility] system.     Airview download 09/09/23-10/08/23 Usage days 29/30 days (97%); 28 days (93%) > 4 hours  Average usage days used 6 hours 45 mins Pressure 10cm h20 Airleaks 7.6L/min  AHI 1.3  Significant tests/ events reviewed   07/2019-PFTs -ratio normal, FEV1 1.94/91%, significant bronchodilator response, improved to 105%, high normal DLCO 2015 normal spirometry   07/07/2019-right heart cath-no evidence of pulmonary hypertension, suspect noncardiac etiology for dyspnea  although diastolic dysfunction component is also possible   Split night study 01/2016 >> AHI 37/h >> CPAP 10 cm   Cardiac cath 05/2018 no significant CAD, LVEDP 14   Echo 05/2018 grade 2 diastolic dysfunction, normal LVEF   CT renal stone study 06/2017 >> bibasilar atelectasis, no ILD     01/2016 split-night study-AHI 22/hour, CPAP 10 cm       Allergies  Allergen Reactions   Latex Itching and Rash   Morphine  Other (See Comments)    Reaction with esophagus, unable to swallow.    Morphine  And Codeine Shortness Of Breath and Swelling   Other Itching,  Shortness Of Breath and Swelling   Ace Inhibitors Cough   Aspirin Other (See Comments)    Reaction with esophagus, unable to swallow.    Losartan Cough   Statins Other (See Comments)    Elevation of liver enzymes   Tapentadol  Other (See Comments)    Nausea, increased sleepiness, h/a   Adhesive [Tape] Rash   Tomato Other (See Comments)    Acid reflux due to acid in tomato    Immunization History  Administered Date(s) Administered   Influenza Split 03/30/2011   Influenza Whole 03/21/2005, 02/09/2006   Influenza, Seasonal, Injecte, Preservative Fre 03/07/2023   Influenza,inj,Quad PF,6+ Mos 02/11/2014, 07/27/2015, 03/09/2016, 01/29/2017, 02/07/2018, 01/30/2019, 02/11/2020, 03/03/2021, 12/29/2021   Influenza-Unspecified 01/29/2017   PFIZER(Purple Top)SARS-COV-2 Vaccination 07/22/2019, 08/12/2019, 03/05/2020, 11/19/2020   Pneumococcal Polysaccharide-23 04/17/2006, 09/14/2015   Td 04/17/2006   Tdap 12/19/2022   Zoster Recombinant(Shingrix) 09/22/2022, 10/04/2022    Past Medical History:  Diagnosis Date   Allergy    Phreesia 06/02/2020   Anxiety    Arthritis    Asthma    Asthma    Phreesia 04/12/2020   Breast mass    left (monitor)   CHF (congestive heart failure) (HCC)    Phreesia 04/12/2020   Coronary atherosclerosis of native coronary artery    Mild nonobstructive CAD by cardiac catheterization 2008 - Dr. Glena Landau   Degenerative disc disease    Depression    Depression    Phreesia 04/12/2020   Diabetes mellitus without complication (HCC)    Phreesia 04/12/2020   Dysrhythmia    Emphysema of lung (HCC)    Essential hypertension, benign    Folliculitis 01/21/2015   GERD (gastroesophageal reflux disease)    H/O hiatal hernia    Helicobacter pylori gastritis 2008   Hypercholesterolemia    Hypertension    Hyperthyroidism    s/p radiation   Left-sided epistaxis 05/15/2018   Low back pain    Migraine    Myocardial infarction (HCC)    Nephrolithiasis    Recurring  episodes since 2004   Neuromuscular disorder (HCC)    Phreesia 04/12/2020   Osteoporosis    Oxygen  deficiency    Severe obesity (BMI >= 40) (HCC)    Sleep apnea    STOP BANG SCORE 6no cpap used   Thyroid  disease    Phreesia 04/12/2020   Type 2 diabetes mellitus with diabetic neuropathy (HCC)     Tobacco History: Social History   Tobacco Use  Smoking Status Never  Smokeless Tobacco Never   Counseling given: Not Answered   Outpatient Medications Prior to Visit  Medication Sig Dispense Refill   albuterol  (VENTOLIN  HFA) 108 (90 Base) MCG/ACT inhaler TAKE 2 PUFFS BY MOUTH EVERY 6 HOURS AS NEEDED FOR WHEEZE OR SHORTNESS OF BREATH 8.5 each 6   amLODipine  (NORVASC ) 10 MG tablet TAKE 1 TABLET BY MOUTH EVERY DAY  90 tablet 3   B-D UF III MINI PEN NEEDLES 31G X 5 MM MISC Use to inject insulin  twice daily 100 each 6   betamethasone, augmented, (DIPROLENE) 0.05 % lotion Apply 1 application. topically 2 (two) times daily.     blood glucose meter kit and supplies Dispense based on patient and insurance preference. Once daily testing dx. e11.9 1 each 0   calcipotriene (DOVONOX) 0.005 % ointment Apply 1 application topically daily.      clobetasol cream (TEMOVATE) 0.05 % Apply 1 application. topically 2 (two) times daily.     cloNIDine  (CATAPRES  - DOSED IN MG/24 HR) 0.3 mg/24hr patch APPLY ONE PATCH TO SKIN ONCE WEEKLY 12 patch 3   cloNIDine  (CATAPRES ) 0.3 MG tablet TAKE 1 TABLET (0.3 MG TOTAL) BY MOUTH AT BEDTIME. 90 tablet 1   cyclobenzaprine  (FLEXERIL ) 10 MG tablet TAKE 1 TABLET BY MOUTH THREE TIMES A DAY 90 tablet 5   diazepam  (VALIUM ) 5 MG tablet Take one tablet by mouth three times daily for back spasm 90 tablet 5   DULoxetine  (CYMBALTA ) 60 MG capsule TAKE 1 CAPSULE BY MOUTH 2 TIMES DAILY. 180 capsule 2   esomeprazole  (NEXIUM ) 40 MG capsule TAKE 1 CAPSULE BY MOUTH EVERY DAY 90 capsule 0   Evolocumab  (REPATHA ) 140 MG/ML SOSY Inject 140 mg into the skin every 14 (fourteen) days. 2.1 mL 11    fenofibrate  (TRICOR ) 145 MG tablet TAKE 1 TABLET BY MOUTH EVERY DAY 90 tablet 1   furosemide  (LASIX ) 40 MG tablet Take 1 tablet (40 mg total) by mouth daily. 90 tablet 0   gabapentin  (NEURONTIN ) 800 MG tablet Take one tablet by mouth four times daily for pain 120 tablet 5   glucose blood (ONETOUCH VERIO) test strip Use to test blood glucose three times daily as directed 300 strip 2   hydrocortisone  (PROCTOSOL HC) 2.5 % rectal cream      ipratropium-albuterol  (DUONEB) 0.5-2.5 (3) MG/3ML SOLN Take 3 mLs by nebulization every 6 (six) hours as needed. 360 mL 3   KLOR-CON  M20 20 MEQ tablet TAKE 1 TABLET BY MOUTH TWICE A DAY 180 tablet 1   Lancets (ONETOUCH DELICA PLUS LANCET33G) MISC USE TO TEST THREE TIMES DAILY. DX : E11.65 100 each 3   levothyroxine  (SYNTHROID ) 75 MCG tablet Take 1 tablet (75 mcg total) by mouth daily. 90 tablet 3   loratadine  (CLARITIN ) 10 MG tablet TAKE 1 TABLET BY MOUTH EVERY DAY 100 tablet 3   metFORMIN  (GLUCOPHAGE ) 500 MG tablet Take 1 tablet (500 mg total) by mouth 2 (two) times daily with a meal. 180 tablet 3   metoprolol  tartrate (LOPRESSOR ) 25 MG tablet Take 1 tablet (25 mg total) by mouth 2 (two) times daily. 180 tablet 3   naloxone  (NARCAN ) nasal spray 4 mg/0.1 mL One spray into each nostril once as needed 1 each 0   nitroGLYCERIN  (NITROSTAT ) 0.4 MG SL tablet Place 1 tablet (0.4 mg total) under the tongue every 5 (five) minutes as needed for chest pain. 30 tablet 7   oxyCODONE  (OXYCONTIN ) 40 mg 12 hr tablet Take 1 tablet (40 mg total) by mouth every 12 (twelve) hours. 60 tablet 0   oxyCODONE  (OXYCONTIN ) 40 mg 12 hr tablet Take pone tablet by mouth two times daily for pain 60 tablet 0   oxyCODONE  (OXYCONTIN ) 40 mg 12 hr tablet Take 1 tablet (40 mg total) by mouth every 12 (twelve) hours. 60 tablet 0   [START ON 10/31/2023] oxyCODONE  (OXYCONTIN ) 40 mg 12 hr tablet  Take one tablet by moth two times daily 60 tablet 0   [START ON 11/30/2023] oxyCODONE  (OXYCONTIN ) 40 mg 12 hr tablet  Take one tablet by mouth two times daily 60 tablet 0   [START ON 12/30/2023] oxyCODONE  (OXYCONTIN ) 40 mg 12 hr tablet Take one tablet by mouth two times daily 60 tablet 0   rosuvastatin  (CRESTOR ) 10 MG tablet TAKE ONE TABLET BY MOUTH EVERY MONDAY, WEDNESDAY AND FRIDAY 36 tablet 3   spironolactone  (ALDACTONE ) 25 MG tablet TAKE 1 TABLET (25 MG TOTAL) BY MOUTH DAILY. 90 tablet 1   SYMBICORT  160-4.5 MCG/ACT inhaler Inhale 2 puffs into the lungs in the morning and at bedtime. 1 each 12   tirzepatide  (MOUNJARO ) 12.5 MG/0.5ML Pen Inject 12.5 mg into the skin once a week. 6 mL 3   trimethoprim -polymyxin b  (POLYTRIM ) ophthalmic solution PLACE 1 DROP INTO BOTH EYES EVERY 4 (FOUR) HOURS FOR 7 DAYS. MAX 4 DOSES PER DAY.     UNABLE TO FIND BD Ultra-Fine Mini Pen Needle 31 gauge x 3/16  USE AS DIRECTED TWICE A DAY     UNABLE TO FIND OneTouch Verio test strips  USE AS INSTRUCTED FOUR TIMES DAILY TESTING DX E11.65     UNABLE TO FIND OneTouch Delica Plus Lancet 33 gauge  USE TO TEST ONCE DAILY     UNABLE TO FIND Med Name:  Nutritional supplement 225 units/m or PRN Incontinence pads   264/m or PRN Underpads/Chux     90/m or PRN Nonsterile gloves  1B/M OR PRN 1 each PRN   UNABLE TO FIND Med Name: Step stool with handle 1 each 0   UNABLE TO FIND Med Name:  BEDSIDE STEP STOOL WITH HANDLE 1 each 0   UNABLE TO FIND Med Name:  Under pads/CHUX                      90/M OR PRN NUTRITIONAL SUPPLEMENT  225U/M OR PRN INCONTINENCE PADS              264/M OR PRN NONSTERILE GLOVES             1B/M OR PRN WIPES                                         2PKG/M 1 each 12   UNABLE TO FIND Med Name: LIFT CHAIR  DX: R29.898 1 each 0   UNABLE TO FIND Med Name: LIFT CHAIR 1 each 0   UNABLE TO FIND Med Name: Quad Cane DX: R26.89 1 each 0   Vitamin D , Ergocalciferol , (DRISDOL ) 1.25 MG (50000 UNIT) CAPS capsule TAKE 1 CAPSULE (50,000 UNITS TOTAL) BY MOUTH ONCE A WEEK. ONE CAPSULE ONCE WEEKLY 12 capsule 2   zolpidem  (AMBIEN ) 10  MG tablet Take 1 tablet (10 mg total) by mouth at bedtime. 30 tablet 5   zolpidem  (AMBIEN ) 10 MG tablet Take 1 tablet (10 mg total) by mouth at bedtime. 30 tablet 5   zolpidem  (AMBIEN ) 10 MG tablet Take 1 tablet (10 mg total) by mouth at bedtime. 30 tablet 5   No facility-administered medications prior to visit.   Review of Systems  Review of Systems  Constitutional:  Positive for unexpected weight change.  HENT: Negative.    Respiratory:  Positive for cough and wheezing.   Cardiovascular:  Positive for leg swelling.   Physical Exam  There were  no vitals taken for this visit. Physical Exam Constitutional:      General: She is not in acute distress.    Appearance: Normal appearance. She is obese. She is not ill-appearing.  HENT:     Head: Normocephalic and atraumatic.  Cardiovascular:     Rate and Rhythm: Normal rate and regular rhythm.  Pulmonary:     Effort: Pulmonary effort is normal.     Breath sounds: Normal breath sounds. No wheezing or rhonchi.  Musculoskeletal:        General: Normal range of motion.  Skin:    General: Skin is warm and dry.  Neurological:     General: No focal deficit present.     Mental Status: She is alert and oriented to person, place, and time. Mental status is at baseline.  Psychiatric:        Mood and Affect: Mood normal.        Behavior: Behavior normal.        Thought Content: Thought content normal.        Judgment: Judgment normal.      Lab Results:  CBC    Component Value Date/Time   WBC 5.4 06/20/2023 0943   WBC 6.7 07/03/2019 1519   RBC 4.97 06/20/2023 0943   RBC 4.90 07/03/2019 1519   HGB 12.9 06/20/2023 0943   HCT 41.1 06/20/2023 0943   PLT 556 (H) 06/20/2023 0943   MCV 83 06/20/2023 0943   MCH 26.0 (L) 06/20/2023 0943   MCH 24.9 (L) 07/03/2019 1519   MCHC 31.4 (L) 06/20/2023 0943   MCHC 31.0 07/03/2019 1519   RDW 13.2 06/20/2023 0943   LYMPHSABS 2.6 05/06/2018 0335   MONOABS 0.5 05/06/2018 0335   EOSABS 0.2  05/06/2018 0335   BASOSABS 0.0 05/06/2018 0335    BMET    Component Value Date/Time   NA 141 05/14/2023 0926   K 4.4 05/14/2023 0926   CL 101 05/14/2023 0926   CO2 23 05/14/2023 0926   GLUCOSE 114 (H) 05/14/2023 0926   GLUCOSE 316 (H) 02/11/2020 1111   BUN 25 05/14/2023 0926   CREATININE 1.07 (H) 05/14/2023 0926   CREATININE 0.91 02/11/2020 1111   CALCIUM  10.2 05/14/2023 0926   GFRNONAA 70 05/06/2020 1201   GFRNONAA 69 02/11/2020 1111   GFRAA 80 05/06/2020 1201   GFRAA 80 02/11/2020 1111    BNP No results found for: BNP  ProBNP    Component Value Date/Time   PROBNP 194 06/05/2019 1019    Imaging: No results found.   Assessment & Plan:   1. OSA (obstructive sleep apnea) (Primary) - AMB REFERRAL FOR DME  Assessment and Plan    Asthma Chronic asthma managed with Symbicort  160mcg two puffs q 12 hours and albuterol . Reports chronic cough and increased wheezing, possibly related to fluid retention. Currently taking loratadine  for allergies. - Ensure refills for Symbicort  and albuterol  are available.  Obstructive sleep apnea Chronic obstructive sleep apnea managed with CPAP. Current machine is over 2 years old. She is compliant with CPAP usage. Current pressure 10cm h20, residual AHI 1.3/hour indicating good control.  - Order a new CPAP machine at current pressure  - Schedule a virtual compliance check within 31 to 90 days after receiving the new machine.  Diastolic heart failure Chronic diastolic heart failure with fluid retention. Reports fluid buildup and difficulty when lying down. Currently managed with furosemide  (Lasix ). - Provide a 90-day supply of Lasix  40 mg to ensure coverage until September. - Recommend follow-up  with cardiology for ongoing management of diastolic heart failure and Lasix  prescription.  Type 2 diabetes mellitus Type 2 diabetes mellitus with recent improvement in glycemic control. A1c reduced to 6.9. Has been able to reduce metformin   dosage and discontinue insulin , likely due to weight loss and Mounjaro  therapy.  Follow-up - Follow up with cardiology in September for diastolic heart failure management.   Antonio Baumgarten, NP 10/10/2023

## 2023-10-10 NOTE — Patient Instructions (Addendum)
 We will provide a temporary supply for lasix  but further RX should come from cardiology or primary care    VISIT SUMMARY: Today, we discussed the management of your CPAP machine, medication refills, and your ongoing health conditions including asthma, sleep apnea, heart failure, and diabetes. You reported good compliance with your CPAP machine but are experiencing some issues at night. We also reviewed your medications and made necessary adjustments.  YOUR PLAN: -ASTHMA: Asthma is a condition where your airways narrow and swell, causing difficulty in breathing. You will continue using Symbicort  twice daily and your emergency inhaler as needed. Refills for these medications have been ensured.  -OBSTRUCTIVE SLEEP APNEA: Obstructive sleep apnea is a condition where your breathing repeatedly stops and starts during sleep. We will order a new CPAP machine for you since your current one is over 41 years old. A virtual compliance check will be scheduled within 31 to 90 days after you receive the new machine.  -DIASTOLIC HEART FAILURE: Diastolic heart failure occurs when the heart's ability to relax and fill with blood is impaired. You will receive a 90-day supply of Lasix  40 mg to manage fluid retention. It is important to follow up with your cardiologist in September for ongoing management of prescription.  -TYPE 2 DIABETES MELLITUS: Type 2 diabetes mellitus is a condition that affects the way your body processes blood sugar. Your A1c has improved to 6.9, and you have been able to reduce your metformin  dosage and discontinue insulin . Continue with your current management plan, including Mounjaro  therapy.  Follow-up  Please schedule a virtual compliance check for your new CPAP machine within 31 to 90 days after you receive it.

## 2023-10-17 ENCOUNTER — Telehealth: Payer: Self-pay | Admitting: Family Medicine

## 2023-10-17 DIAGNOSIS — M62830 Muscle spasm of back: Secondary | ICD-10-CM

## 2023-10-17 NOTE — Telephone Encounter (Unsigned)
 Copied from CRM [id number]. Topic: Clinical - Medication Refill >> Oct 17, 2023  1:15 PM Sophia H wrote: Medication:  cyclobenzaprine  (FLEXERIL ) 10 MG tablet    Has the patient contacted their pharmacy? Yes, pharmacy states has been trying to get in contact for 5 days now.   This is the patient's preferred pharmacy:   [id number] - [place], [place] - [address] AT [facility] [address] [place] [address] Phone: [phone] Fax: [phone]  Is this the correct pharmacy for this prescription? Yes If no, delete pharmacy and type the correct one.   Has the prescription been filled recently? Yes  Is the patient out of the medication? Yes, patient has been out for a few days now   Has the patient been seen for an appointment in the last year OR does the patient have an upcoming appointment? Yes  Can we respond through MyChart? Yes  Agent: Please be advised that Rx refills may take up to 3 business days. We ask that you follow-up with your pharmacy.

## 2023-10-25 ENCOUNTER — Other Ambulatory Visit: Payer: Self-pay | Admitting: Family Medicine

## 2023-10-25 DIAGNOSIS — M62830 Muscle spasm of back: Secondary | ICD-10-CM

## 2023-10-25 NOTE — Telephone Encounter (Unsigned)
 Copied from CRM [id number]. Topic: Clinical - Medication Refill >> Oct 25, 2023  4:39 PM Turkey B wrote: Medication: cyclobenzaprine  (FLEXERIL ) 10 MG tablet 2nd request, looks like first request was stock in system  Has the patient contacted their pharmacy? yes (Agent: If yes, when and what did the pharmacy advise?)contact pcp  This is the patient's preferred pharmacy:    [id number] - [place], [place] - [address] AT [facility] [address] [place] [address] Phone: [phone] Fax: [phone]  Is this the correct pharmacy for this prescription? yes Has the prescription been filled recently? no  Is the patient out of the medication? yes  Has the patient been seen for an appointment in the last year OR does the patient have an upcoming appointment? yes  Can we respond through MyChart? yes  Agent: Please be advised that Rx refills may take up to 3 business days. We ask that you follow-up with your pharmacy.

## 2023-10-29 ENCOUNTER — Telehealth: Payer: Self-pay

## 2023-10-29 ENCOUNTER — Other Ambulatory Visit: Payer: Self-pay

## 2023-10-29 DIAGNOSIS — M62830 Muscle spasm of back: Secondary | ICD-10-CM

## 2023-10-29 MED ORDER — CYCLOBENZAPRINE HCL 10 MG PO TABS: 10.0000 mg | ORAL_TABLET | Freq: Three times a day (TID) | ORAL | 1 refills | Status: DC

## 2023-10-29 NOTE — Telephone Encounter (Signed)
 [facility]

## 2023-10-29 NOTE — Telephone Encounter (Signed)
 Copied from CRM [id number]. Topic: Clinical - Prescription Issue >> Oct 29, 2023  2:42 PM Silvana PARAS wrote: Reason for CRM: Mitzie from [facility] calling [phone] to f/u on medication refill request submitted for pt on 6/26 for cyclobenzaprine  (FLEXERIL ) 10 MG tablet.

## 2023-11-04 DIAGNOSIS — R0609 Other forms of dyspnea: Secondary | ICD-10-CM | POA: Diagnosis not present

## 2023-11-08 DIAGNOSIS — G4733 Obstructive sleep apnea (adult) (pediatric): Secondary | ICD-10-CM | POA: Diagnosis not present

## 2023-11-20 ENCOUNTER — Ambulatory Visit (INDEPENDENT_AMBULATORY_CARE_PROVIDER_SITE_OTHER)

## 2023-11-20 VITALS — Ht 64.0 in | Wt 250.0 lb

## 2023-11-20 DIAGNOSIS — Z0000 Encounter for general adult medical examination without abnormal findings: Secondary | ICD-10-CM | POA: Diagnosis not present

## 2023-11-20 NOTE — Patient Instructions (Signed)
 Angela [name] ,  Thank you for taking time out of your busy schedule to complete your Annual Wellness Visit with me. I enjoyed our conversation and look forward to speaking with you again next year. I, as well as your care team,  appreciate your ongoing commitment to your health goals. Please review the following plan we discussed and let me know if I can assist you in the future.  I enjoyed our conversation and look forward to it again next year. Blessing for the upcoming year!!  -Angela [name]     TOGETHER, WE CAME UP WITH THE FOLLOWING GAME PLAN!  Referrals/Orders/Labs Placed Today: None placed Follow up Visits Next Visit with PCP:01/11/24 with Dr. Antonetta Next Medicare Wellness w/ Health Advisor (1 year):November 24, 2024 at 8:00 video visit Clinician Recommendations:  Aim for 30 minutes of exercise or brisk walking, 6-8 glasses of water , and 5 servings of fruits and vegetables each day.       This is a list of the screening recommended for you and due dates:  Health Maintenance  Topic Date Due   DTaP/Tdap/Td vaccine (1 - Tdap) Never done   Zoster (Shingles) Vaccine (2 of 2) 09/04/2021   COVID-19 Vaccine (3 - 2024-25 season) 12/31/2022   Flu Shot  11/30/2023   Medicare Annual Wellness Visit  11/18/2024   DEXA scan (bone density measurement)  08/26/2025   Pneumococcal Vaccine for age over 67  Completed   Hepatitis B Vaccine  Aged Out   HPV Vaccine  Aged Out   Meningitis B Vaccine  Aged Out   Hepatitis C Screening  Discontinued    Advanced directives: (Provided) Advance directive discussed with you today. I have provided a copy for you to complete at home and have notarized. Once this is complete, please bring a copy in to our office so we can scan it into your chart.  Advance Care Planning is important because it:  [x]  Makes sure you receive the medical care that is consistent with your values, goals, and preferences  [x]  It provides guidance to your family and loved ones and reduces their  decisional burden about whether or not they are making the right decisions based on your wishes.  Follow the link provided in your after visit summary or read over the paperwork we have mailed to you to help you started getting your Advance Directives in place. If you need assistance in completing these, please reach out to us  so that we can help you!  Follow the link provided in your after visit summary or read over the paperwork we have mailed to you to help you started getting your Advance Directives in place. If you need assistance in completing these, please reach out to us  so that we can help you!  We will mail you an Advanced Directives packet as we discussed during your visit today. You do not have to have an attorney to complete this paperwork. Read over the packet, discuss it with family, and complete it. Choose who will be making decisions for you in the event you can no longer make them for yourself. Once completed have them notarized, and bring the packet back to the office. We will scan it and make sure it gets into your chart.   If you choose to have a DNR (Do Not Resuscitate Order) you must get this from your primary care doctor because they have to sign it. You can get this in the office during an appointment.   THIS ORDER MUST  BE VISIBLE AT ALL TIMES WITHIN YOUR HOME SUCH AS ON A REFRIGERATOR.

## 2023-11-20 NOTE — Progress Notes (Signed)
 Subjective:   Angela [name] is a 63 y.o. who presents for a Medicare Wellness preventive visit.  As a reminder, Annual Wellness Visits don't include a physical exam, and some assessments may be limited, especially if this visit is performed virtually. We may recommend an in-person follow-up visit with your provider if needed.  Visit Complete: Virtual I connected with  Angela Batie on 11/20/23 by a audio enabled telemedicine application and verified that I am speaking with the correct person using two identifiers.  Patient Location: Home  Provider Location: Home Office  I discussed the limitations of evaluation and management by telemedicine. The patient expressed understanding and agreed to proceed.  Vital Signs: Because this visit was a virtual/telehealth visit, some criteria may be missing or patient reported. Any vitals not documented were not able to be obtained and vitals that have been documented are patient reported.  VideoDeclined- This patient declined Librarian, academic. Therefore the visit was completed with audio only.  Persons Participating in Visit: Patient.  AWV Questionnaire: Yes: Patient Medicare AWV questionnaire was completed by the patient on 11/19/2023; I have confirmed that all information answered by patient is correct and no changes since this date.        Objective:    Today's Vitals   11/19/23 0902 11/20/23 0808  Weight:  250 lb (113.4 kg)  Height:  5' 4 (1.626 m)  PainSc: 9     Body mass index is 42.91 kg/m.     11/20/2023    7:59 AM 04/19/2022   11:14 AM 04/18/2021    8:53 AM 03/01/2021    3:30 PM 04/15/2020    8:39 AM 07/07/2019    7:41 AM 05/06/2018   12:27 PM  Advanced Directives  Does Patient Have a Medical Advance Directive? No No No No No Yes No   Type of Careers adviser;Living will   Does patient want to make changes to medical advance directive?      No - Patient  declined   Copy of Healthcare Power of Attorney in Chart?      No - copy requested   Would patient like information on creating a medical advance directive? Yes (MAU/Ambulatory/Procedural Areas - Information given)  Yes (ED - Information included in AVS) No - Patient declined No - Patient declined  No - Patient declined      Data saved with a previous flowsheet row definition    Current Medications (verified) Outpatient Encounter Medications as of 11/20/2023  Medication Sig   albuterol  (VENTOLIN  HFA) 108 (90 Base) MCG/ACT inhaler Inhale 2 puffs into the lungs every 6 (six) hours as needed for wheezing or shortness of breath.   amLODipine  (NORVASC ) 10 MG tablet TAKE 1 TABLET BY MOUTH EVERY DAY   B-D UF III MINI PEN NEEDLES 31G X 5 MM MISC Use to inject insulin  twice daily   betamethasone, augmented, (DIPROLENE) 0.05 % lotion Apply 1 application. topically 2 (two) times daily.   blood glucose meter kit and supplies Dispense based on patient and insurance preference. Once daily testing dx. e11.9   calcipotriene (DOVONOX) 0.005 % ointment Apply 1 application topically daily.    clobetasol cream (TEMOVATE) 0.05 % Apply 1 application. topically 2 (two) times daily.   cloNIDine  (CATAPRES  - DOSED IN MG/24 HR) 0.3 mg/24hr patch APPLY ONE PATCH TO SKIN ONCE WEEKLY   cloNIDine  (CATAPRES ) 0.3 MG tablet TAKE 1 TABLET (0.3 MG TOTAL) BY MOUTH AT BEDTIME.  cyclobenzaprine  (FLEXERIL ) 10 MG tablet Take 1 tablet (10 mg total) by mouth 3 (three) times daily.   diazepam  (VALIUM ) 5 MG tablet Take one tablet by mouth three times daily for back spasm   DULoxetine  (CYMBALTA ) 60 MG capsule TAKE 1 CAPSULE BY MOUTH 2 TIMES DAILY.   esomeprazole  (NEXIUM ) 40 MG capsule TAKE 1 CAPSULE BY MOUTH EVERY DAY   Evolocumab  (REPATHA ) 140 MG/ML SOSY Inject 140 mg into the skin every 14 (fourteen) days.   fenofibrate  (TRICOR ) 145 MG tablet TAKE 1 TABLET BY MOUTH EVERY DAY   furosemide  (LASIX ) 40 MG tablet Take 1 tablet (40 mg  total) by mouth daily.   gabapentin  (NEURONTIN ) 800 MG tablet Take one tablet by mouth four times daily for pain   glucose blood (ONETOUCH VERIO) test strip Use to test blood glucose three times daily as directed   hydrocortisone  (PROCTOSOL HC) 2.5 % rectal cream    ipratropium-albuterol  (DUONEB) 0.5-2.5 (3) MG/3ML SOLN Take 3 mLs by nebulization every 6 (six) hours as needed.   KLOR-CON  M20 20 MEQ tablet TAKE 1 TABLET BY MOUTH TWICE A DAY   Lancets (ONETOUCH DELICA PLUS LANCET33G) MISC USE TO TEST THREE TIMES DAILY. DX : E11.65   levothyroxine  (SYNTHROID ) 75 MCG tablet Take 1 tablet (75 mcg total) by mouth daily.   loratadine  (CLARITIN ) 10 MG tablet TAKE 1 TABLET BY MOUTH EVERY DAY   metFORMIN  (GLUCOPHAGE ) 500 MG tablet Take 1 tablet (500 mg total) by mouth 2 (two) times daily with a meal.   metoprolol  tartrate (LOPRESSOR ) 25 MG tablet Take 1 tablet (25 mg total) by mouth 2 (two) times daily.   naloxone  (NARCAN ) nasal spray 4 mg/0.1 mL One spray into each nostril once as needed   nitroGLYCERIN  (NITROSTAT ) 0.4 MG SL tablet Place 1 tablet (0.4 mg total) under the tongue every 5 (five) minutes as needed for chest pain.   oxyCODONE  (OXYCONTIN ) 40 mg 12 hr tablet Take 1 tablet (40 mg total) by mouth every 12 (twelve) hours.   oxyCODONE  (OXYCONTIN ) 40 mg 12 hr tablet Take pone tablet by mouth two times daily for pain   oxyCODONE  (OXYCONTIN ) 40 mg 12 hr tablet Take one tablet by moth two times daily   [START ON 11/30/2023] oxyCODONE  (OXYCONTIN ) 40 mg 12 hr tablet Take one tablet by mouth two times daily   [START ON 12/30/2023] oxyCODONE  (OXYCONTIN ) 40 mg 12 hr tablet Take one tablet by mouth two times daily   rosuvastatin  (CRESTOR ) 10 MG tablet TAKE ONE TABLET BY MOUTH EVERY MONDAY, WEDNESDAY AND FRIDAY   spironolactone  (ALDACTONE ) 25 MG tablet TAKE 1 TABLET (25 MG TOTAL) BY MOUTH DAILY.   SYMBICORT  160-4.5 MCG/ACT inhaler Inhale 2 puffs into the lungs in the morning and at bedtime.   tirzepatide   (MOUNJARO ) 12.5 MG/0.5ML Pen Inject 12.5 mg into the skin once a week.   trimethoprim -polymyxin b  (POLYTRIM ) ophthalmic solution PLACE 1 DROP INTO BOTH EYES EVERY 4 (FOUR) HOURS FOR 7 DAYS. MAX 4 DOSES PER DAY.   UNABLE TO FIND BD Ultra-Fine Mini Pen Needle 31 gauge x 3/16  USE AS DIRECTED TWICE A DAY   UNABLE TO FIND OneTouch Verio test strips  USE AS INSTRUCTED FOUR TIMES DAILY TESTING DX E11.65   UNABLE TO FIND OneTouch Delica Plus Lancet 33 gauge  USE TO TEST ONCE DAILY   UNABLE TO FIND Med Name:  Nutritional supplement 225 units/m or PRN Incontinence pads   264/m or PRN Underpads/Chux     90/m or PRN Nonsterile  gloves  1B/M OR PRN   UNABLE TO FIND Med Name: Step stool with handle   UNABLE TO FIND Med Name:  BEDSIDE STEP STOOL WITH HANDLE   UNABLE TO FIND Med Name:  Under pads/CHUX                      90/M OR PRN NUTRITIONAL SUPPLEMENT  225U/M OR PRN INCONTINENCE PADS              264/M OR PRN NONSTERILE GLOVES             1B/M OR PRN WIPES                                         2PKG/M   UNABLE TO FIND Med Name: LIFT CHAIR  DX: R29.898   UNABLE TO FIND Med Name: LIFT CHAIR   UNABLE TO FIND Med Name: Danae Roughen DX: R26.89   Vitamin D , Ergocalciferol , (DRISDOL ) 1.25 MG (50000 UNIT) CAPS capsule TAKE 1 CAPSULE (50,000 UNITS TOTAL) BY MOUTH ONCE A WEEK. ONE CAPSULE ONCE WEEKLY   zolpidem  (AMBIEN ) 10 MG tablet Take 1 tablet (10 mg total) by mouth at bedtime.   zolpidem  (AMBIEN ) 10 MG tablet Take 1 tablet (10 mg total) by mouth at bedtime.   zolpidem  (AMBIEN ) 10 MG tablet Take 1 tablet (10 mg total) by mouth at bedtime.   [DISCONTINUED] ezetimibe  (ZETIA ) 10 MG tablet Take 1 tablet (10 mg total) by mouth daily.   No facility-administered encounter medications on file as of 11/20/2023.    Allergies (verified) Latex, Morphine , Morphine  and codeine, Other, Ace inhibitors, Aspirin, Losartan, Statins, Tapentadol , Adhesive [tape], and Tomato   History: Past Medical History:  Diagnosis  Date   Allergy    Phreesia 06/02/2020   Anxiety    Arthritis    Asthma    Asthma    Phreesia 04/12/2020   Breast mass    left (monitor)   CHF (congestive heart failure) (HCC)    Phreesia 04/12/2020   Coronary atherosclerosis of native coronary artery    Mild nonobstructive CAD by cardiac catheterization 2008 - Dr. Levern   Degenerative disc disease    Depression    Depression    Phreesia 04/12/2020   Diabetes mellitus without complication (HCC)    [facility]reesia 04/12/2020   Dysrhythmia    Emphysema of lung (HCC)    Essential hypertension, benign    Folliculitis 01/21/2015   GERD (gastroesophageal reflux disease)    H/O hiatal hernia    Helicobacter pylori gastritis 2008   Hypercholesterolemia    Hypertension    Hyperthyroidism    s/p radiation   Left-sided epistaxis 05/15/2018   Low back pain    Migraine    Myocardial infarction (HCC)    Nephrolithiasis    Recurring episodes since 2004   Neuromuscular disorder (HCC)    Phreesia 04/12/2020   Osteoporosis    Oxygen  deficiency    Severe obesity (BMI >= 40) (HCC)    Sleep apnea    STOP BANG SCORE 6no cpap used   Thyroid  disease    Phreesia 04/12/2020   Type 2 diabetes mellitus with diabetic neuropathy (HCC)    Past Surgical History:  Procedure Laterality Date   ABDOMINAL HYSTERECTOMY     BACK SURGERY  1993   BACK SURGERY  1994   BACK SURGERY  2002   BACK SURGERY  2011   [facility]    CARPAL TUNNEL RELEASE Left    CHOLECYSTECTOMY  1996   COLONOSCOPY  2000 [facility]   INT HEMORRHOIDS/FISSURE   COLONOSCOPY  2003 [facility], CHANGE IN BOWEL HABITS   INT HEMORRHOIDS   COLONOSCOPY  2006 [facility]   INT HEMORRHOIDS   COLONOSCOPY  2007 [facility]   INT HEMORRHOIDS   CYSTOSCOPY WITH STENT PLACEMENT Left 07/25/2012   Procedure: CYSTOSCOPY, left retograde pyelogram WITH left ureteral  STENT PLACEMENT;  Surgeon: Oneil JAYSON Rafter, MD;  Location: [facility];  Service: Urology;  Laterality: Left;   CYSTOSCOPY/RETROGRADE/URETEROSCOPY   12/04/2011   Procedure: CYSTOSCOPY/RETROGRADE/URETEROSCOPY;  Surgeon: Toribio Neysa Repine, MD;  Location: [facility];  Service: Urology;  Laterality: Right;   CYSTOSCOPY/RETROGRADE/URETEROSCOPY/STONE EXTRACTION WITH BASKET Left 09/03/2012   Procedure: CYSTOSCOPY/RETROGRADE pyelogram/digital URETEROSCOPY/STONE EXTRACTION WITH BASKET, left stent removal;  Surgeon: Toribio Neysa Repine, MD;  Location: [facility];  Service: Urology;  Laterality: Left;   ESOPHAGOGASTRODUODENOSCOPY  2008   DOQ:Dfjoo hiatal hernia./Normal esophagus without evidence of Barrett's mass, stricture, erosion or ulceration./Normal duodenal bulb and second portion of the duodenum./Diffuse erythema in the body and the antrum with occasional erosion.  Biopsies obtained via cold forceps to evaluate for H. pylori gastritis   FRACTURE SURGERY  1999   right clavicle   HERNIA REPAIR     HOLMIUM LASER APPLICATION Left 09/03/2012   Procedure: HOLMIUM LASER APPLICATION;  Surgeon: Toribio Neysa Repine, MD;  Location: [facility];  Service: Urology;  Laterality: Left;   KNEE ARTHROSCOPY  10/04/2010   right knee arthroscopy, dr gerome   LEFT HEART CATH AND CORONARY ANGIOGRAPHY N/A 05/08/2018   Procedure: LEFT HEART CATH AND CORONARY ANGIOGRAPHY;  Surgeon: Burnard Debby LABOR, MD;  Location: [facility]AS[facility];  Service: Cardiovascular;  Laterality: N/A;   left knee arthroscopic surgery  1999   Left salphingectomy secondary to ectopic pregnancy  1991   PARTIAL HYSTERECTOMY  1991   RIGHT HEART CATH N/A 07/07/2019   Procedure: RIGHT HEART CATH;  Surgeon: Anner Alm ORN, MD;  Location: [facility] INVASIVE CV LAB;  Service: Cardiovascular;  Laterality: N/A;   rotary cuff  Right 05/14/2014   [facility]   SPINE SURGERY N/A    Phreesia 06/02/2020   UPPER GASTROINTESTINAL ENDOSCOPY  2008 abd pain   H. pylori gastritis   UPPER GASTROINTESTINAL ENDOSCOPY  1996 [facility] [place]   [facility]   Family History  Problem Relation Age of Onset   Heart disease Mother     Hypertension Mother    Cancer Mother        Cervical    Asthma Mother    Heart attack Father    Cancer Father        Prostate   Colon cancer Father        DECEASED AGE 41   Cancer Sister    Colon polyps Neg Hx    Social History   Socioeconomic History   Marital status: Single    Spouse name: Not on file   Number of children: 1   Years of education: 12   Highest education level: 12th grade  Occupational History   Occupation: Disable   Tobacco Use   Smoking status: Never   Smokeless tobacco: Never  Vaping Use   Vaping status: Never Used  Substance and Sexual Activity   Alcohol use: No   Drug use: No   Sexual activity: Not Currently    Partners: Male    Birth control/protection: Surgical, None    Comment:  hyst  Other Topics Concern   Not on file  Social History Narrative   Lives alone    Social Drivers of Health   Financial Resource Strain: Low Risk  (11/19/2023)   Overall Financial Resource Strain (CARDIA)    Difficulty of Paying Living Expenses: Not hard at all  Food Insecurity: No Food Insecurity (11/19/2023)   Hunger Vital Sign    Worried About Running Out of Food in the Last Year: Never true    Ran Out of Food in the Last Year: Never true  Transportation Needs: No Transportation Needs (11/19/2023)   PRAPARE - Administrator, Civil Service (Medical): No    Lack of Transportation (Non-Medical): No  Physical Activity: Sufficiently Active (11/19/2023)   Exercise Vital Sign    Days of Exercise per Week: 6 days    Minutes of Exercise per Session: 30 min  Stress: Patient Declined (11/19/2023)   [employer] of Occupational Health - Occupational Stress Questionnaire    Feeling of Stress: Patient declined  Social Connections: Moderately Integrated (11/19/2023)   Social Connection and Isolation Panel    Frequency of Communication with Friends and Family: More than three times a week    Frequency of Social Gatherings with Friends and Family: Three times  a week    Attends Religious Services: 1 to 4 times per year    Active Member of Clubs or Organizations: No    Attends Engineer, structural: More than 4 times per year    Marital Status: Divorced    Tobacco Counseling Counseling given: Yes    Clinical Intake:  Pre-visit preparation completed: Yes  Pain : 0-10 Pain Score: 9  Pain Type: Chronic pain Pain Location: Back Pain Orientation: Lower Pain Descriptors / Indicators: Constant     BMI - recorded: 42.91 Nutritional Status: BMI > 30  Obese Diabetes: Yes CBG done?: No (telehealth visit. unable to obtain) Did pt. bring in CBG monitor from home?: No  Lab Results  Component Value Date   HGBA1C 6.9 (A) 09/25/2023   HGBA1C 7.2 (A) 05/18/2023   HGBA1C 7.4 (A) 01/09/2023     How often do you need to have someone help you when you read instructions, pamphlets, or other written materials from your doctor or pharmacy?: 1 - Never  Interpreter Needed?: No  Information entered by :: Stefano ORN [facility]   Activities of Daily Living     11/19/2023    9:02 AM  In your present state of health, do you have any difficulty performing the following activities:  Hearing? 0  Vision? 0  Difficulty concentrating or making decisions? 0  Walking or climbing stairs? 1  Dressing or bathing? 1  Doing errands, shopping? 1  Preparing Food and eating ? N  Using the Toilet? N  In the past six months, have you accidently leaked urine? Y  Do you have problems with loss of bowel control? N  Managing your Medications? N  Managing your Finances? N  Housekeeping or managing your Housekeeping? Y    Patient Care Team: Antonetta Rollene BRAVO, MD as PCP - General [place], Leita BIRCH, OT as Occupational Therapist (Occupational Therapy) Hope Almarie ORN, NP as Nurse Practitioner (Pulmonary Disease) Loni Soyla LABOR, MD as Consulting Physician (Cardiology) Eliza Sieving, MD as Consulting Physician (Urology) Gerome Charleston, MD  (Inactive) (Orthopedic Surgery) Darroll Anes, DO (Optometry)  I have updated your Care Teams any recent Medical Services you may have received from other providers in the past year.  Assessment:   This is a routine wellness examination for Angela.  Hearing/Vision screen Hearing Screening - Comments:: Patient denies any hearing difficulties.   Vision Screening - Comments:: Wears rx glasses - up to date with routine eye exams with  Oneil Kawasaki w/ [facility] [place] location   Goals Addressed             This Visit's Progress    Patient Stated       Continue to lose weight so I can get off some of my medications        Depression Screen     11/20/2023    8:28 AM 09/25/2023    9:04 AM 09/25/2023    8:52 AM 06/20/2023    8:41 AM 12/19/2022   10:22 AM 09/20/2022   10:33 AM 06/20/2022   10:56 AM  PHQ 2/9 Scores  PHQ - 2 Score 0 1 4 0 1 4 3   PHQ- 9 Score 0 7 12  7 7 9     Fall Risk     11/19/2023    9:02 AM 09/25/2023    8:52 AM 06/20/2023    8:41 AM 03/07/2023   10:49 AM 12/19/2022   10:22 AM  Fall Risk   Falls in the past year? 1 1 0 0 0  Number falls in past yr: 0 0 0 0   Injury with Fall? 0 0 0 0   Risk for fall due to : History of fall(s);Impaired mobility;Impaired balance/gait  No Fall Risks No Fall Risks   Follow up Falls evaluation completed;Education provided;Falls prevention discussed Falls evaluation completed Falls evaluation completed Falls evaluation completed     MEDICARE RISK AT HOME:  Medicare Risk at Home Any stairs in or around the home?: (Patient-Rptd) No If so, are there any without handrails?: (Patient-Rptd) No Home free of loose throw rugs in walkways, pet beds, electrical cords, etc?: (Patient-Rptd) Yes Adequate lighting in your home to reduce risk of falls?: (Patient-Rptd) Yes Life alert?: (Patient-Rptd) Yes Use of a cane, walker or w/c?: (Patient-Rptd) Yes Grab bars in the bathroom?: (Patient-Rptd) Yes Shower chair or bench in  shower?: (Patient-Rptd) Yes Elevated toilet seat or a handicapped toilet?: (Patient-Rptd) Yes  TIMED UP AND GO:  Was the test performed?  No  Cognitive Function: 6CIT completed        11/20/2023    8:27 AM 04/19/2022   11:17 AM 04/18/2021    8:53 AM 04/15/2019    9:00 AM 04/10/2018   10:17 AM  6CIT Screen  What Year? 0 points 0 points 0 points 0 points 0 points  What month? 0 points 0 points 0 points 0 points 0 points  What time? 0 points 0 points 0 points 0 points 0 points  Count back from 20 0 points 0 points 0 points 0 points 0 points  Months in reverse 0 points 0 points 0 points 0 points 0 points  Repeat phrase 0 points 0 points 0 points 0 points 4 points  Total Score 0 points 0 points 0 points 0 points 4 points    Immunizations Immunization History  Administered Date(s) Administered   Influenza Split 03/30/2011   Influenza Whole 03/21/2005, 02/09/2006   Influenza, Seasonal, Injecte, Preservative Fre 03/07/2023   Influenza,inj,Quad PF,6+ Mos 02/11/2014, 07/27/2015, 03/09/2016, 01/29/2017, 02/07/2018, 01/30/2019, 02/11/2020, 03/03/2021, 12/29/2021   Influenza-Unspecified 01/29/2017   PFIZER(Purple Top)SARS-COV-2 Vaccination 07/22/2019, 08/12/2019, 03/05/2020, 11/19/2020   Pneumococcal Polysaccharide-23 04/17/2006, 09/14/2015   Td 04/17/2006   Tdap 12/19/2022   Zoster  Recombinant(Shingrix) 09/22/2022, 10/04/2022    Screening Tests Health Maintenance  Topic Date Due   Zoster Vaccines- Shingrix (2 of 2) 11/29/2022   Pneumococcal Vaccine 18-36 Years old (2 of 2 - PCV) 06/19/2024 (Originally 09/13/2016)   COVID-19 Vaccine (5 - 2024-25 season) 06/19/2024 (Originally 12/31/2022)   MAMMOGRAM  11/21/2023   INFLUENZA VACCINE  11/30/2023   FOOT EXAM  03/06/2024   Diabetic kidney evaluation - Urine ACR  03/08/2024   HEMOGLOBIN A1C  03/27/2024   Diabetic kidney evaluation - eGFR measurement  05/13/2024   OPHTHALMOLOGY EXAM  07/10/2024   Medicare Annual Wellness (AWV)   11/19/2024   Colonoscopy  10/06/2029   DTaP/Tdap/Td (3 - Td or Tdap) 12/18/2032   Hepatitis C Screening  Completed   HIV Screening  Completed   Hepatitis B Vaccines  Aged Out   HPV VACCINES  Aged Out   Meningococcal B Vaccine  Aged Out    Health Maintenance  Health Maintenance Due  Topic Date Due   Zoster Vaccines- Shingrix (2 of 2) 11/29/2022   Health Maintenance Items Addressed: Routine Screenings up to date.   Additional Screening:  Vision Screening: Recommended annual ophthalmology exams for early detection of glaucoma and other disorders of the eye. Would you like a referral to an eye doctor? No    Dental Screening: Recommended annual dental exams for proper oral hygiene  Community Resource Referral / Chronic Care Management: CRR required this visit?  No   CCM required this visit?  No   Plan:    I have personally reviewed and noted the following in the patient's chart:   Medical and social history Use of alcohol, tobacco or illicit drugs  Current medications and supplements including opioid prescriptions. Patient is currently taking opioid prescriptions. Information provided to patient regarding non-opioid alternatives. Patient advised to discuss non-opioid treatment plan with their provider. Functional ability and status Nutritional status Physical activity Advanced directives List of other physicians Hospitalizations, surgeries, and ER visits in previous 12 months Vitals Screenings to include cognitive, depression, and falls Referrals and appointments  In addition, I have reviewed and discussed with patient certain preventive protocols, quality metrics, and best practice recommendations. A written personalized care plan for preventive services as well as general preventive health recommendations were provided to patient.   Sevana Grandinetti, CMA   11/20/2023   After Visit Summary: (MyChart) Due to this being a telephonic visit, the after visit summary with  patients personalized plan was offered to patient via MyChart   Notes: Please refer to Routing Comments.

## 2023-11-26 ENCOUNTER — Ambulatory Visit (HOSPITAL_COMMUNITY)
Admission: RE | Admit: 2023-11-26 | Discharge: 2023-11-26 | Disposition: A | Discharge status: Home / Self Care | Payer: 59 | Source: Ambulatory Visit | Attending: Family Medicine | Admitting: Family Medicine

## 2023-11-26 DIAGNOSIS — Z1231 Encounter for screening mammogram for malignant neoplasm of breast: Secondary | ICD-10-CM | POA: Insufficient documentation

## 2023-11-27 ENCOUNTER — Other Ambulatory Visit: Payer: Self-pay | Admitting: Family Medicine

## 2023-11-29 ENCOUNTER — Encounter: Payer: Self-pay | Admitting: Podiatry

## 2023-11-29 ENCOUNTER — Ambulatory Visit (INDEPENDENT_AMBULATORY_CARE_PROVIDER_SITE_OTHER): Admitting: Podiatry

## 2023-11-29 DIAGNOSIS — M79675 Pain in left toe(s): Secondary | ICD-10-CM

## 2023-11-29 DIAGNOSIS — M79674 Pain in right toe(s): Secondary | ICD-10-CM

## 2023-11-29 DIAGNOSIS — Q828 Other specified congenital malformations of skin: Secondary | ICD-10-CM

## 2023-11-29 DIAGNOSIS — B351 Tinea unguium: Secondary | ICD-10-CM

## 2023-11-29 DIAGNOSIS — L84 Corns and callosities: Secondary | ICD-10-CM | POA: Diagnosis not present

## 2023-11-29 DIAGNOSIS — E1142 Type 2 diabetes mellitus with diabetic polyneuropathy: Secondary | ICD-10-CM | POA: Diagnosis not present

## 2023-12-01 ENCOUNTER — Encounter: Payer: Self-pay | Admitting: Podiatry

## 2023-12-01 NOTE — Progress Notes (Signed)
 Subjective:  [name], female    DOB: Apr 30, 1961,  MRN: [id number]  Angela [name] presents to clinic today for at risk foot care with history of diabetic neuropathy and callus(es) left foot, porokeratotic lesion(s) right foot, and painful mycotic nails. Painful toenails interfere with ambulation. Aggravating factors include wearing enclosed shoe gear. Pain is relieved with periodic professional debridement. Painful callus(es) and porokeratotic lesion(s) are aggravated when weightbearing with and without shoegear. Pain is relieved with periodic professional debridement.  Patient states she will be traveling to [facility] and [place] to visit [place]. Chief Complaint  Patient presents with   [facility]    Rm3 Diabetic foot care/ Dr. Antonetta last visit May 2025/ A1c 6.9/ Bloodsugar 132   New problem(s): None.   PCP is Antonetta Rollene BRAVO, MD.  Allergies  Allergen Reactions   Latex Itching and Rash   Morphine  Other (See Comments)    Reaction with esophagus, unable to swallow.    Morphine  And Codeine Shortness Of Breath and Swelling   Other Itching, Shortness Of Breath and Swelling   Ace Inhibitors Cough   Aspirin Other (See Comments)    Reaction with esophagus, unable to swallow.    Losartan Cough   Statins Other (See Comments)    Elevation of liver enzymes   Tapentadol  Other (See Comments)    Nausea, increased sleepiness, h/a   Adhesive [Tape] Rash   Tomato Other (See Comments)    Acid reflux due to acid in tomato    Review of Systems: Negative except as noted in the HPI.  Objective: No changes noted in today's physical examination. There were no vitals filed for this visit. Angela [name] is a pleasant 63 y.o. female morbidly obese in NAD. AAO x 3.  Vascular Examination: Palpable pedal pulses. CFT immediate b/l. Pedal hair present. No edema. No pain with calf compression b/l. Skin temperature gradient WNL b/l. No varicosities noted. No cyanosis or clubbing noted.  Neurological  Examination: Pt has subjective symptoms of neuropathy. Protective sensation decreased with 10 gram monofilament b/l.   Dermatological Examination: Pedal skin with normal turgor, texture and tone b/l. No open wounds nor interdigital macerations noted. Toenails 1-5 b/l thick, discolored, elongated with subungual debris and pain on dorsal palpation. Hyperkeratos(is/es) noted submet head 5 left foot, plantarlateral aspect midfoot b/l. Porokeratotic lesion(s) right heel. No erythema, no edema, no drainage, no fluctuance.  Musculoskeletal Examination: Muscle strength 5/5 to b/l LE.  No pain, crepitus noted b/l. No gross pedal deformities. Patient ambulates independently without assistive aids.   Radiographs: None  Assessment/Plan: 1. Pain due to onychomycosis of toenails of both feet   2. Callus   3. Porokeratosis   4. Diabetic peripheral neuropathy associated with type 2 diabetes mellitus [facility])     Consent given for treatment. Diabetic foot examination performed. All patient's and/or POA's questions/concerns addressed on today's visit. Mycotic toenails 1-5 debrided in length and girth without incident. Callus submet head 5 left foot, plantarlateral midfoot b/l and porokeratotic lesion(s) right heel pared with sharp debridement without incident. Continue foot and shoe inspections daily. Monitor blood glucose per PCP/Endocrinologist's recommendations.Continue soft, supportive shoe gear daily. Report any pedal injuries to medical professional. Call office if there are any quesitons/concerns. -Patient/POA to call should there be question/concern in the interim.   Return in about 3 months (around 02/29/2024).  Delon LITTIE Merlin, DPM      [place] LOCATION: [address]  [place], [place] [address]                   Office [phone]   [facility] LOCATION: [address] [place], [place] [address] Office [phone]

## 2023-12-03 ENCOUNTER — Other Ambulatory Visit: Payer: Self-pay | Admitting: Family Medicine

## 2023-12-05 DIAGNOSIS — R0609 Other forms of dyspnea: Secondary | ICD-10-CM | POA: Diagnosis not present

## 2023-12-10 ENCOUNTER — Telehealth: Payer: Self-pay

## 2023-12-10 NOTE — Telephone Encounter (Signed)
 Copied from CRM [id number]. Topic: Appointments - Scheduling Inquiry for Clinic >> Dec 10, 2023 11:04 AM Angela [name] wrote: Reason for CRM: Patient will not be back in town until 8/30 - Dr Antonetta will not be avail til 11/7 - She is concerned her oxyCODONE  (OXYCONTIN ) 40 mg 12 hr tablet will not stay up to date?  Can she wait til 11/7 to be seen - call her to talk about schedule and her medication.

## 2023-12-21 ENCOUNTER — Other Ambulatory Visit: Payer: Self-pay | Admitting: Family Medicine

## 2023-12-21 ENCOUNTER — Telehealth: Payer: Self-pay

## 2023-12-21 DIAGNOSIS — M62830 Muscle spasm of back: Secondary | ICD-10-CM

## 2023-12-21 MED ORDER — CYCLOBENZAPRINE HCL 10 MG PO TABS: 10.0000 mg | ORAL_TABLET | Freq: Three times a day (TID) | ORAL | 5 refills | Status: AC

## 2023-12-21 NOTE — Addendum Note (Signed)
 Addended by: ANTONETTA ROLLENE BRAVO on: 12/21/2023 09:21 AM   Modules accepted: Orders

## 2023-12-21 NOTE — Telephone Encounter (Signed)
Cyclobenzaprine is refilled.

## 2023-12-21 NOTE — Telephone Encounter (Signed)
 Pt needing refill of cyclobenzaprine , please refill is appropriate.

## 2023-12-25 ENCOUNTER — Ambulatory Visit: Admitting: Family Medicine

## 2023-12-31 ENCOUNTER — Other Ambulatory Visit: Payer: Self-pay | Admitting: Family Medicine

## 2023-12-31 DIAGNOSIS — G4733 Obstructive sleep apnea (adult) (pediatric): Secondary | ICD-10-CM | POA: Diagnosis not present

## 2024-01-01 ENCOUNTER — Ambulatory Visit (INDEPENDENT_AMBULATORY_CARE_PROVIDER_SITE_OTHER): Admitting: Family Medicine

## 2024-01-01 ENCOUNTER — Encounter: Payer: Self-pay | Admitting: Family Medicine

## 2024-01-01 VITALS — BP 110/72 | Resp 14 | Ht 60.0 in | Wt 265.0 lb

## 2024-01-01 DIAGNOSIS — E1165 Type 2 diabetes mellitus with hyperglycemia: Secondary | ICD-10-CM

## 2024-01-01 DIAGNOSIS — I10 Essential (primary) hypertension: Secondary | ICD-10-CM | POA: Diagnosis not present

## 2024-01-01 DIAGNOSIS — E039 Hypothyroidism, unspecified: Secondary | ICD-10-CM

## 2024-01-01 DIAGNOSIS — Z23 Encounter for immunization: Secondary | ICD-10-CM | POA: Diagnosis not present

## 2024-01-01 DIAGNOSIS — R52 Pain, unspecified: Secondary | ICD-10-CM | POA: Diagnosis not present

## 2024-01-01 DIAGNOSIS — E785 Hyperlipidemia, unspecified: Secondary | ICD-10-CM

## 2024-01-01 MED ORDER — OXYCODONE HCL ER 40 MG PO T12A: EXTENDED_RELEASE_TABLET | ORAL | 0 refills | Status: AC

## 2024-01-01 MED ORDER — OXYCODONE HCL ER 40 MG PO T12A
EXTENDED_RELEASE_TABLET | ORAL | 0 refills | Status: AC
Start: 2024-01-31 — End: 2024-03-01

## 2024-01-01 MED ORDER — OXYCODONE HCL ER 40 MG PO T12A: 40.0000 mg | EXTENDED_RELEASE_TABLET | Freq: Two times a day (BID) | ORAL | 0 refills | Status: AC

## 2024-01-01 NOTE — Assessment & Plan Note (Signed)
 After obtaining informed consent, the vaccine is  administered , with no adverse effect noted at the time of administration.

## 2024-01-01 NOTE — Assessment & Plan Note (Signed)
.  cxon1 DASH diet and commitment to daily physical activity for a minimum of 30 minutes discussed and encouraged, as a part of hypertension management. The importance of attaining a healthy weight is also discussed.     01/01/2024    3:41 PM 11/20/2023    8:08 AM 10/10/2023   11:27 AM 09/25/2023    9:46 AM 09/25/2023    8:50 AM 06/20/2023    8:40 AM 05/30/2023    8:22 AM  BP/Weight  Systolic BP 110 -- 157 132 134 135   Diastolic BP 72 -- 82 74 83 83   Wt. (Lbs)  250 275.4 274 272.08 269 275  BMI  42.91 kg/m2 47.27 kg/m2 47.03 kg/m2 46.7 kg/m2 46.17 kg/m2 47.2 kg/m2

## 2024-01-01 NOTE — Assessment & Plan Note (Signed)
 Hyperlipidemia:Low fat diet discussed and encouraged.   Lipid Panel  Lab Results  Component Value Date   CHOL 142 06/20/2023   HDL 44 06/20/2023   LDLCALC 82 06/20/2023   TRIG 85 06/20/2023   CHOLHDL 3.2 06/20/2023     Updated lab needed at/ before next visit.

## 2024-01-01 NOTE — Assessment & Plan Note (Signed)
 Updated lab needed at/ before next visit.

## 2024-01-01 NOTE — Assessment & Plan Note (Signed)
  Patient re-educated about  the importance of commitment to a  minimum of 150 minutes of exercise per week as able.  The importance of healthy food choices with portion control discussed, as well as eating regularly and within a 12 hour window most days. The need to choose clean , green food 50 to 75% of the time is discussed, as well as to make water  the primary drink and set a goal of 64 ounces water  daily.       11/20/2023    8:08 AM 10/10/2023   11:27 AM 09/25/2023    9:46 AM  Weight /BMI  Weight 250 lb 275 lb 6.4 oz 274 lb  Height 5' 4 (1.626 m) 5' 4 (1.626 m) 5' 4 (1.626 m)  BMI 42.91 kg/m2 47.27 kg/m2 47.03 kg/m2

## 2024-01-01 NOTE — Assessment & Plan Note (Signed)
After obtaining informed consent, the flu vaccine is  administered , with no adverse effect noted at the time of administration.

## 2024-01-01 NOTE — Assessment & Plan Note (Signed)
 The patient's Controlled Substance registry is reviewed and compliance confirmed. Adequacy of  Pain control and level of function is assessed. Medication dosing is adjusted as deemed appropriate. Twelve weeks of medication is prescribed , with a follow up appointment between 11 to 12 weeks .

## 2024-01-01 NOTE — Assessment & Plan Note (Addendum)
 Diabetes associated with hypertension, hyperlipidemia, and obesity  Angela [name] is reminded of the importance of commitment to daily physical activity for 30 minutes or more, as able and the need to limit carbohydrate intake to 30 to 60 grams per meal to help with blood sugar control.   The need to take medication as prescribed, test blood sugar as directed, and to call between visits if there is a concern that blood sugar is uncontrolled is also discussed.   Angela [name] is reminded of the importance of daily foot exam, annual eye examination, and good blood sugar, blood pressure and cholesterol control.     Latest Ref Rng & Units 09/25/2023   10:02 AM 06/20/2023    9:43 AM 05/18/2023    9:41 AM 05/14/2023    9:26 AM 03/09/2023    9:45 AM  Diabetic Labs  HbA1c 4.0 - 5.6 % 6.9   7.2     Micro/Creat Ratio 0 - 29 mg/g creat     9   Chol 100 - 199 mg/dL  857      HDL >60 mg/dL  44      Calc LDL 0 - 99 mg/dL  82      Triglycerides 0 - 149 mg/dL  85      Creatinine 9.42 - 1.00 mg/dL    8.92        0/10/7972    3:41 PM 11/20/2023    8:08 AM 10/10/2023   11:27 AM 09/25/2023    9:46 AM 09/25/2023    8:50 AM 06/20/2023    8:40 AM 05/30/2023    8:22 AM  BP/Weight  Systolic BP 110 -- 157 132 134 135   Diastolic BP 72 -- 82 74 83 83   Wt. (Lbs)  250 275.4 274 272.08 269 275  BMI  42.91 kg/m2 47.27 kg/m2 47.03 kg/m2 46.7 kg/m2 46.17 kg/m2 47.2 kg/m2      Latest Ref Rng & Units 07/11/2023   12:00 AM 05/10/2022   12:00 AM  Foot/eye exam completion dates  Eye Exam No Retinopathy No Retinopathy     No Retinopathy      This result is from an external source.

## 2024-01-01 NOTE — Patient Instructions (Addendum)
 F/U in January  Flu vaccine today  Pls get shingrix vaccine at your pharmacy  No changes in medication  Need to work on weight loss again!!  Thanks for choosing [facility], we consider it a privelige to serve you.

## 2024-01-01 NOTE — Progress Notes (Signed)
 Angela [name]     MRN: [id number]      DOB: 12-23-1960  Chief Complaint  Patient presents with   Hypertension    Follow up     HPI Angela [name] is here for follow up and re-evaluation of chronic medical conditions, medication management and review of any available recent lab and radiology data.  Preventive health is updated, specifically  Cancer screening and Immunization.   Questions or concerns regarding consultations or procedures which the PT has had in the interim are  addressed. The PT denies any adverse reactions to current medications since the last visit.  There are no new concerns. Concerned re qweight re gain states she was on vacation, will work on this Denies polyuria, polydipsia, blurred vision , or hypoglycemic episodes.  There are no specific complaints   ROS Denies recent fever or chills. Denies sinus pressure, nasal congestion, ear pain or sore throat. Denies chest congestion, productive cough or wheezing. Denies chest pains, palpitations and leg swelling Denies abdominal pain, nausea, vomiting,diarrhea or constipation.   Denies dysuria, frequency, hesitancy or incontinence. Chronic joint pain, swelling and limitation in mobility. Denies headaches, seizures, numbness, or tingling. Denies depression, anxiety or insomnia. Denies skin break down or rash.   PE BP 110/72   Resp 14   Ht 5' (1.524 m)   Wt 265 lb (120.2 kg)   SpO2 96%   BMI 51.75 kg/m       Patient alert and oriented and in no cardiopulmonary distress.  HEENT: No facial asymmetry, EOMI,     Neck supple .  Chest: Clear to auscultation bilaterally.  CVS: S1, S2 no murmurs, no S3.Regular rate.  ABD: Soft non tender.   Ext: No edema  FD:izrmzjdzi ROM spine, shoulders, hips and knees.  Skin: Intact, no ulcerations or rash noted.  Psych: Good eye contact, normal affect. Memory intact not anxious or depressed appearing.  CNS: CN 2-12 intact, power,  normal throughout.no focal deficits  noted.   Assessment & Plan Encounter for immunization After obtaining informed consent, the vaccine is  administered , with no adverse effect noted at the time of administration.   Immunization due After obtaining informed consent, the flu  vaccine is  administered , with no adverse effect noted at the time of administration.   Encounter for pain management The patient's Controlled Substance registry is reviewed and compliance confirmed. Adequacy of  Pain control and level of function is assessed. Medication dosing is adjusted as deemed appropriate. Twelve weeks of medication is prescribed , with a follow up appointment between 11 to 12 weeks .   Hyperlipidemia LDL goal <100 Hyperlipidemia:Low fat diet discussed and encouraged.   Lipid Panel  Lab Results  Component Value Date   CHOL 142 06/20/2023   HDL 44 06/20/2023   LDLCALC 82 06/20/2023   TRIG 85 06/20/2023   CHOLHDL 3.2 06/20/2023     Updated lab needed at/ before next visit.   Morbid obesity (HCC)  Patient re-educated about  the importance of commitment to a  minimum of 150 minutes of exercise per week as able.  The importance of healthy food choices with portion control discussed, as well as eating regularly and within a 12 hour window most days. The need to choose clean , green food 50 to 75% of the time is discussed, as well as to make water  the primary drink and set a goal of 64 ounces water  daily.       11/20/2023    8:08  AM 10/10/2023   11:27 AM 09/25/2023    9:46 AM  Weight /BMI  Weight 250 lb 275 lb 6.4 oz 274 lb  Height 5' 4 (1.626 m) 5' 4 (1.626 m) 5' 4 (1.626 m)  BMI 42.91 kg/m2 47.27 kg/m2 47.03 kg/m2      Hypothyroidism Updated lab needed at/ before next visit.   Uncontrolled type 2 diabetes mellitus with hyperglycemia (HCC) Diabetes associated with hypertension, hyperlipidemia, and obesity  Angela [name] is reminded of the importance of commitment to daily physical activity for 30  minutes or more, as able and the need to limit carbohydrate intake to 30 to 60 grams per meal to help with blood sugar control.   The need to take medication as prescribed, test blood sugar as directed, and to call between visits if there is a concern that blood sugar is uncontrolled is also discussed.   Angela [name] is reminded of the importance of daily foot exam, annual eye examination, and good blood sugar, blood pressure and cholesterol control.     Latest Ref Rng & Units 09/25/2023   10:02 AM 06/20/2023    9:43 AM 05/18/2023    9:41 AM 05/14/2023    9:26 AM 03/09/2023    9:45 AM  Diabetic Labs  HbA1c 4.0 - 5.6 % 6.9   7.2     Micro/Creat Ratio 0 - 29 mg/g creat     9   Chol 100 - 199 mg/dL  857      HDL >60 mg/dL  44      Calc LDL 0 - 99 mg/dL  82      Triglycerides 0 - 149 mg/dL  85      Creatinine 9.42 - 1.00 mg/dL    8.92        0/10/7972    3:41 PM 11/20/2023    8:08 AM 10/10/2023   11:27 AM 09/25/2023    9:46 AM 09/25/2023    8:50 AM 06/20/2023    8:40 AM 05/30/2023    8:22 AM  BP/Weight  Systolic BP 110 -- 157 132 134 135   Diastolic BP 72 -- 82 74 83 83   Wt. (Lbs)  250 275.4 274 272.08 269 275  BMI  42.91 kg/m2 47.27 kg/m2 47.03 kg/m2 46.7 kg/m2 46.17 kg/m2 47.2 kg/m2      Latest Ref Rng & Units 07/11/2023   12:00 AM 05/10/2022   12:00 AM  Foot/eye exam completion dates  Eye Exam No Retinopathy No Retinopathy     No Retinopathy      This result is from an external source.        Essential hypertension, benign .cxon1 DASH diet and commitment to daily physical activity for a minimum of 30 minutes discussed and encouraged, as a part of hypertension management. The importance of attaining a healthy weight is also discussed.     01/01/2024    3:41 PM 11/20/2023    8:08 AM 10/10/2023   11:27 AM 09/25/2023    9:46 AM 09/25/2023    8:50 AM 06/20/2023    8:40 AM 05/30/2023    8:22 AM  BP/Weight  Systolic BP 110 -- 157 132 134 135   Diastolic BP 72 -- 82 74 83 83   Wt.  (Lbs)  250 275.4 274 272.08 269 275  BMI  42.91 kg/m2 47.27 kg/m2 47.03 kg/m2 46.7 kg/m2 46.17 kg/m2 47.2 kg/m2

## 2024-01-04 ENCOUNTER — Other Ambulatory Visit: Payer: Self-pay | Admitting: Internal Medicine

## 2024-01-05 ENCOUNTER — Other Ambulatory Visit: Payer: Self-pay | Admitting: Nurse Practitioner

## 2024-01-06 ENCOUNTER — Other Ambulatory Visit: Payer: Self-pay | Admitting: Family Medicine

## 2024-01-07 ENCOUNTER — Other Ambulatory Visit: Payer: Self-pay | Admitting: Family Medicine

## 2024-01-08 DIAGNOSIS — E039 Hypothyroidism, unspecified: Secondary | ICD-10-CM | POA: Diagnosis not present

## 2024-01-09 ENCOUNTER — Other Ambulatory Visit: Payer: Self-pay | Admitting: Family Medicine

## 2024-01-09 ENCOUNTER — Telehealth: Payer: Self-pay | Admitting: Internal Medicine

## 2024-01-09 LAB — TSH: TSH: 0.871 u[IU]/mL (ref 0.450–4.500)

## 2024-01-09 LAB — T4, FREE: Free T4: 1.52 ng/dL (ref 0.82–1.77)

## 2024-01-09 MED ORDER — METOPROLOL TARTRATE 25 MG PO TABS: 25.0000 mg | ORAL_TABLET | Freq: Two times a day (BID) | ORAL | 0 refills | Status: DC

## 2024-01-09 MED ORDER — DIAZEPAM 5 MG PO TABS: ORAL_TABLET | ORAL | 1 refills | Status: AC

## 2024-01-09 NOTE — Telephone Encounter (Signed)
 RX sent in

## 2024-01-09 NOTE — Telephone Encounter (Signed)
*  STAT* If patient is at the pharmacy, call can be transferred to refill team.   1. Which medications need to be refilled? (please list name of each medication and dose if known) metoprolol  tartrate (LOPRESSOR ) 25 MG tablet    2. Would you like to learn more about the convenience, safety, & potential cost savings by using the [facility]? No   3. Are you open to using the [facility] (Type [facility]. ) No   4. Which pharmacy/location (including street and city if local pharmacy) is medication to be sent to?  [id number] - [place], [place] - [address] AT [facility]   5. Do they need a 30 day or 90 day supply? 90 day  Pt out of medication and has scheduled appt on 10/7

## 2024-01-09 NOTE — Telephone Encounter (Signed)
 Copied from CRM [id number]. Topic: Clinical - Medication Refill >> Jan 09, 2024 10:19 AM [place] A wrote: Medication: diazepam  (VALIUM ) 5 MG tablet  Has the patient contacted their pharmacy? Yes (Agent: If no, request that the patient contact the pharmacy for the refill. If patient does not wish to contact the pharmacy document the reason why and proceed with request.) (Agent: If yes, when and what did the pharmacy advise?) Pharmacy has reached out for three days no response  This is the patient's preferred pharmacy:  [id number] - [place], [place] - [address] AT [facility] [address] [place] [address] Phone: [phone] Fax: [phone]  Is this the correct pharmacy for this prescription? Yes If no, delete pharmacy and type the correct one.   Has the prescription been filled recently? No  Is the patient out of the medication? Yes  Has the patient been seen for an appointment in the last year OR does the patient have an upcoming appointment? Yes  Can we respond through MyChart? Yes  Agent: Please be advised that Rx refills may take up to 3 business days. We ask that you follow-up with your pharmacy.

## 2024-01-09 NOTE — Telephone Encounter (Signed)
 FYI Only or Action Required?: Action required by provider: medication refill request.  Patient was last seen in primary care on 01/01/2024 by Antonetta Rollene BRAVO, MD.  Called Nurse Triage reporting No chief complaint on file..  Symptoms began today.  Interventions attempted: Nothing.  Symptoms are: stable.  Triage Disposition: No disposition on file.  Patient/caregiver understands and will follow disposition?:

## 2024-01-11 ENCOUNTER — Encounter: Payer: Self-pay | Admitting: Nurse Practitioner

## 2024-01-11 ENCOUNTER — Ambulatory Visit (INDEPENDENT_AMBULATORY_CARE_PROVIDER_SITE_OTHER): Admitting: Nurse Practitioner

## 2024-01-11 ENCOUNTER — Ambulatory Visit: Admitting: Family Medicine

## 2024-01-11 VITALS — BP 114/80 | HR 83 | Ht 60.0 in | Wt 265.8 lb

## 2024-01-11 DIAGNOSIS — E559 Vitamin D deficiency, unspecified: Secondary | ICD-10-CM | POA: Diagnosis not present

## 2024-01-11 DIAGNOSIS — Z7985 Long-term (current) use of injectable non-insulin antidiabetic drugs: Secondary | ICD-10-CM

## 2024-01-11 DIAGNOSIS — I10 Essential (primary) hypertension: Secondary | ICD-10-CM

## 2024-01-11 DIAGNOSIS — Z7984 Long term (current) use of oral hypoglycemic drugs: Secondary | ICD-10-CM | POA: Diagnosis not present

## 2024-01-11 DIAGNOSIS — Z794 Long term (current) use of insulin: Secondary | ICD-10-CM

## 2024-01-11 DIAGNOSIS — E1159 Type 2 diabetes mellitus with other circulatory complications: Secondary | ICD-10-CM | POA: Diagnosis not present

## 2024-01-11 DIAGNOSIS — E039 Hypothyroidism, unspecified: Secondary | ICD-10-CM

## 2024-01-11 DIAGNOSIS — E782 Mixed hyperlipidemia: Secondary | ICD-10-CM | POA: Diagnosis not present

## 2024-01-11 LAB — POCT GLYCOSYLATED HEMOGLOBIN (HGB A1C): Hemoglobin A1C: 7.3 % — AB (ref 4.0–5.6)

## 2024-01-11 MED ORDER — METFORMIN HCL 500 MG PO TABS: 500.0000 mg | ORAL_TABLET | Freq: Every day | ORAL | 3 refills | Status: DC

## 2024-01-11 MED ORDER — TIRZEPATIDE 12.5 MG/0.5ML ~~LOC~~ SOAJ: 12.5000 mg | SUBCUTANEOUS | 3 refills | Status: DC

## 2024-01-11 NOTE — Progress Notes (Signed)
 01/11/2024, 9:25 AM                    Endocrinology follow-up note   Subjective:    [name], female    DOB: 1961-02-10.  Angela [name] is being seen in follow-up in the management of currently uncontrolled symptomatic type 2 diabetes.  She also has comorbid dyslipidemia, hypertension.   PMD:  Antonetta Rollene BRAVO, MD.   Past Medical History:  Diagnosis Date   Allergy    Phreesia 06/02/2020   Anxiety    Arthritis    Asthma    Asthma    Phreesia 04/12/2020   Breast mass    left (monitor)   CHF (congestive heart failure) (HCC)    Phreesia 04/12/2020   Coronary atherosclerosis of native coronary artery    Mild nonobstructive CAD by cardiac catheterization 2008 - Dr. Levern   Degenerative disc disease    Depression    Depression    Phreesia 04/12/2020   Diabetes mellitus without complication (HCC)    Phreesia 04/12/2020   Dysrhythmia    Emphysema of lung (HCC)    Essential hypertension, benign    Folliculitis 01/21/2015   GERD (gastroesophageal reflux disease)    H/O hiatal hernia    Helicobacter pylori gastritis 2008   Hypercholesterolemia    Hypertension    Hyperthyroidism    s/p radiation   Left-sided epistaxis 05/15/2018   Low back pain    Migraine    Myocardial infarction (HCC)    Nephrolithiasis    Recurring episodes since 2004   Neuromuscular disorder (HCC)    Phreesia 04/12/2020   Osteoporosis    Oxygen  deficiency    Severe obesity (BMI >= 40) (HCC)    Sleep apnea    STOP BANG SCORE 6no cpap used   Thyroid  disease    Phreesia 04/12/2020   Type 2 diabetes mellitus with diabetic neuropathy (HCC)     Past Surgical History:  Procedure Laterality Date   ABDOMINAL HYSTERECTOMY     BACK SURGERY  1993   BACK SURGERY  1994   BACK SURGERY  2002   BACK SURGERY  2011   [facility]    CARPAL TUNNEL RELEASE Left    CHOLECYSTECTOMY  1996   COLONOSCOPY  2000 [facility]   INT HEMORRHOIDS/FISSURE   COLONOSCOPY  2003  [facility], CHANGE IN BOWEL HABITS   INT HEMORRHOIDS   COLONOSCOPY  2006 [facility]   INT HEMORRHOIDS   COLONOSCOPY  2007 [facility]   INT HEMORRHOIDS   CYSTOSCOPY WITH STENT PLACEMENT Left 07/25/2012   Procedure: CYSTOSCOPY, left retograde pyelogram WITH left ureteral  STENT PLACEMENT;  Surgeon: Oneil JAYSON Rafter, MD;  Location: [facility];  Service: Urology;  Laterality: Left;   CYSTOSCOPY/RETROGRADE/URETEROSCOPY  12/04/2011   Procedure: CYSTOSCOPY/RETROGRADE/URETEROSCOPY;  Surgeon: Toribio Neysa Repine, MD;  Location: [facility];  Service: Urology;  Laterality: Right;   CYSTOSCOPY/RETROGRADE/URETEROSCOPY/STONE EXTRACTION WITH BASKET Left 09/03/2012   Procedure: CYSTOSCOPY/RETROGRADE pyelogram/digital URETEROSCOPY/STONE EXTRACTION WITH BASKET, left stent removal;  Surgeon: Toribio Neysa Repine, MD;  Location: [facility];  Service: Urology;  Laterality: Left;   ESOPHAGOGASTRODUODENOSCOPY  2008   DOQ:Dfjoo hiatal hernia./Normal esophagus without evidence of Barrett's mass, stricture, erosion or ulceration./Normal duodenal bulb and second portion of the duodenum./Diffuse erythema in the body and the antrum with occasional erosion.  Biopsies obtained via cold forceps to evaluate for H. pylori gastritis   FRACTURE SURGERY  1999   right clavicle   HERNIA REPAIR  HOLMIUM LASER APPLICATION Left 09/03/2012   Procedure: HOLMIUM LASER APPLICATION;  Surgeon: Toribio Neysa Repine, MD;  Location: [facility];  Service: Urology;  Laterality: Left;   KNEE ARTHROSCOPY  10/04/2010   right knee arthroscopy, dr gerome   LEFT HEART CATH AND CORONARY ANGIOGRAPHY N/A 05/08/2018   Procedure: LEFT HEART CATH AND CORONARY ANGIOGRAPHY;  Surgeon: Burnard Debby LABOR, MD;  Location: [facility] INVASIVE CV LAB;  Service: Cardiovascular;  Laterality: N/A;   left knee arthroscopic surgery  1999   Left salphingectomy secondary to ectopic pregnancy  1991   PARTIAL HYSTERECTOMY  1991   RIGHT HEART CATH N/A 07/07/2019   Procedure: RIGHT HEART CATH;   Surgeon: Anner Alm ORN, MD;  Location: [facility] INVASIVE CV LAB;  Service: Cardiovascular;  Laterality: N/A;   rotary cuff  Right 05/14/2014   [facility]   SPINE SURGERY N/A    Phreesia 06/02/2020   UPPER GASTROINTESTINAL ENDOSCOPY  2008 abd pain   H. pylori gastritis   UPPER GASTROINTESTINAL ENDOSCOPY  [place] [facility] [place]   [facility]    Social History   Socioeconomic History   Marital status: Single    Spouse name: Not on file   Number of children: 1   Years of education: 12   Highest education level: 12th grade  Occupational History   Occupation: Disable   Tobacco Use   Smoking status: Never   Smokeless tobacco: Never  Vaping Use   Vaping status: Never Used  Substance and Sexual Activity   Alcohol use: No   Drug use: No   Sexual activity: Not Currently    Partners: Male    Birth control/protection: Surgical, None    Comment: hyst  Other Topics Concern   Not on file  Social History Narrative   Lives alone    Social Drivers of Health   Financial Resource Strain: Low Risk  (11/19/2023)   Overall Financial Resource Strain (CARDIA)    Difficulty of Paying Living Expenses: Not hard at all  Food Insecurity: No Food Insecurity (11/19/2023)   Hunger Vital Sign    Worried About Running Out of Food in the Last Year: Never true    Ran Out of Food in the Last Year: Never true  Transportation Needs: No Transportation Needs (11/19/2023)   PRAPARE - Administrator, Civil Service (Medical): No    Lack of Transportation (Non-Medical): No  Physical Activity: Sufficiently Active (11/19/2023)   Exercise Vital Sign    Days of Exercise per Week: 6 days    Minutes of Exercise per Session: 30 min  Stress: Patient Declined (11/19/2023)   [employer] of Occupational Health - Occupational Stress Questionnaire    Feeling of Stress: Patient declined  Social Connections: Moderately Integrated (11/19/2023)   Social Connection and Isolation Panel    Frequency of Communication with Friends  and Family: More than three times a week    Frequency of Social Gatherings with Friends and Family: Three times a week    Attends Religious Services: 1 to 4 times per year    Active Member of Clubs or Organizations: No    Attends Engineer, structural: More than 4 times per year    Marital Status: Divorced    Family History  Problem Relation Age of Onset   Heart disease Mother    Hypertension Mother    Cancer Mother        Cervical    Asthma Mother    Heart attack Father    Cancer  Father        Prostate   Colon cancer Father        DECEASED AGE 42   Cancer Sister    Colon polyps Neg Hx     Outpatient Encounter Medications as of 01/11/2024  Medication Sig   albuterol  (VENTOLIN  HFA) 108 (90 Base) MCG/ACT inhaler Inhale 2 puffs into the lungs every 6 (six) hours as needed for wheezing or shortness of breath.   amLODipine  (NORVASC ) 10 MG tablet TAKE 1 TABLET BY MOUTH EVERY DAY   B-D UF III MINI PEN NEEDLES 31G X 5 MM MISC Use to inject insulin  twice daily   betamethasone, augmented, (DIPROLENE) 0.05 % lotion Apply 1 application. topically 2 (two) times daily.   blood glucose meter kit and supplies Dispense based on patient and insurance preference. Once daily testing dx. e11.9   calcipotriene (DOVONOX) 0.005 % ointment Apply 1 application topically daily.    clobetasol cream (TEMOVATE) 0.05 % Apply 1 application. topically 2 (two) times daily.   cloNIDine  (CATAPRES  - DOSED IN MG/24 HR) 0.3 mg/24hr patch APPLY ONE PATCH TO SKIN ONCE WEEKLY   cloNIDine  (CATAPRES ) 0.3 MG tablet TAKE 1 TABLET (0.3 MG TOTAL) BY MOUTH AT BEDTIME.   cyclobenzaprine  (FLEXERIL ) 10 MG tablet Take 1 tablet (10 mg total) by mouth 3 (three) times daily.   diazepam  (VALIUM ) 5 MG tablet Take one tablet by mouth three times daily for back spasm   DULoxetine  (CYMBALTA ) 60 MG capsule TAKE 1 CAPSULE BY MOUTH 2 TIMES DAILY.   esomeprazole  (NEXIUM ) 40 MG capsule TAKE 1 CAPSULE BY MOUTH EVERY DAY   Evolocumab   (REPATHA ) 140 MG/ML SOSY Inject 140 mg into the skin every 14 (fourteen) days.   fenofibrate  (TRICOR ) 145 MG tablet TAKE 1 TABLET BY MOUTH EVERY DAY   furosemide  (LASIX ) 40 MG tablet Take 1 tablet (40 mg total) by mouth daily.   gabapentin  (NEURONTIN ) 800 MG tablet TAKE ONE TABLET BY MOUTH FOUR TIMES DAILY FOR PAIN   glucose blood (ONETOUCH VERIO) test strip Use to test blood glucose three times daily as directed   hydrocortisone  (PROCTOSOL HC) 2.5 % rectal cream    ipratropium-albuterol  (DUONEB) 0.5-2.5 (3) MG/3ML SOLN Take 3 mLs by nebulization every 6 (six) hours as needed.   KLOR-CON  M20 20 MEQ tablet TAKE 1 TABLET BY MOUTH TWICE A DAY   Lancets (ONETOUCH DELICA PLUS LANCET33G) MISC USE TO TEST THREE TIMES DAILY. DX : E11.65   levothyroxine  (SYNTHROID ) 75 MCG tablet Take 1 tablet (75 mcg total) by mouth daily.   loratadine  (CLARITIN ) 10 MG tablet TAKE 1 TABLET BY MOUTH EVERY DAY   metFORMIN  (GLUCOPHAGE ) 500 MG tablet Take 1 tablet (500 mg total) by mouth 2 (two) times daily with a meal. (Patient taking differently: Take 500 mg by mouth daily with breakfast.)   metoprolol  tartrate (LOPRESSOR ) 25 MG tablet Take 1 tablet (25 mg total) by mouth 2 (two) times daily.   naloxone  (NARCAN ) nasal spray 4 mg/0.1 mL One spray into each nostril once as needed   nitroGLYCERIN  (NITROSTAT ) 0.4 MG SL tablet Place 1 tablet (0.4 mg total) under the tongue every 5 (five) minutes as needed for chest pain.   oxyCODONE  (OXYCONTIN ) 40 mg 12 hr tablet Take 1 tablet (40 mg total) by mouth every 12 (twelve) hours.   [START ON 03/01/2024] oxyCODONE  (OXYCONTIN ) 40 mg 12 hr tablet Take 1 tablet (40 mg total) by mouth every 12 (twelve) hours.   rosuvastatin  (CRESTOR ) 10 MG tablet TAKE ONE TABLET  BY MOUTH EVERY MONDAY, WEDNESDAY AND FRIDAY   spironolactone  (ALDACTONE ) 25 MG tablet TAKE 1 TABLET (25 MG TOTAL) BY MOUTH DAILY.   SYMBICORT  160-4.5 MCG/ACT inhaler Inhale 2 puffs into the lungs in the morning and at bedtime.    tirzepatide  (MOUNJARO ) 12.5 MG/0.5ML Pen Inject 12.5 mg into the skin once a week.   trimethoprim -polymyxin b  (POLYTRIM ) ophthalmic solution PLACE 1 DROP INTO BOTH EYES EVERY 4 (FOUR) HOURS FOR 7 DAYS. MAX 4 DOSES PER DAY.   UNABLE TO FIND BD Ultra-Fine Mini Pen Needle 31 gauge x 3/16  USE AS DIRECTED TWICE A DAY   UNABLE TO FIND OneTouch Verio test strips  USE AS INSTRUCTED FOUR TIMES DAILY TESTING DX E11.65   UNABLE TO FIND OneTouch Delica Plus Lancet 33 gauge  USE TO TEST ONCE DAILY   UNABLE TO FIND Med Name:  Nutritional supplement 225 units/m or PRN Incontinence pads   264/m or PRN Underpads/Chux     90/m or PRN Nonsterile gloves  1B/M OR PRN   UNABLE TO FIND Med Name: Step stool with handle   UNABLE TO FIND Med Name:  BEDSIDE STEP STOOL WITH HANDLE   UNABLE TO FIND Med Name:  Under pads/CHUX                      90/M OR PRN NUTRITIONAL SUPPLEMENT  225U/M OR PRN INCONTINENCE PADS              264/M OR PRN NONSTERILE GLOVES             1B/M OR PRN WIPES                                         2PKG/M   UNABLE TO FIND Med Name: LIFT CHAIR  DX: R29.898   UNABLE TO FIND Med Name: LIFT CHAIR   UNABLE TO FIND Med Name: Danae Roughen DX: R26.89   Vitamin D , Ergocalciferol , (DRISDOL ) 1.25 MG (50000 UNIT) CAPS capsule TAKE 1 CAPSULE (50,000 UNITS TOTAL) BY MOUTH ONCE A WEEK. ONE CAPSULE ONCE WEEKLY   zolpidem  (AMBIEN ) 10 MG tablet Take 1 tablet (10 mg total) by mouth at bedtime.   oxyCODONE  (OXYCONTIN ) 40 mg 12 hr tablet Take 1 tablet (40 mg total) by mouth every 12 (twelve) hours. (Patient not taking: Reported on 01/11/2024)   oxyCODONE  (OXYCONTIN ) 40 mg 12 hr tablet Take pone tablet by mouth two times daily for pain (Patient not taking: Reported on 01/11/2024)   oxyCODONE  (OXYCONTIN ) 40 mg 12 hr tablet Take one tablet by mouth two times daily (Patient not taking: Reported on 01/11/2024)   oxyCODONE  (OXYCONTIN ) 40 mg 12 hr tablet Take one tablet by mouth two times daily (Patient not taking:  Reported on 01/11/2024)   [START ON 01/31/2024] oxyCODONE  (OXYCONTIN ) 40 mg 12 hr tablet Take one tablet by mouth twice daily for pain (Patient not taking: Reported on 01/11/2024)   [START ON 03/31/2024] oxyCODONE  (OXYCONTIN ) 40 mg 12 hr tablet Take one tablet by mouth two times daily for pain   zolpidem  (AMBIEN ) 10 MG tablet Take 1 tablet (10 mg total) by mouth at bedtime. (Patient not taking: Reported on 01/11/2024)   zolpidem  (AMBIEN ) 10 MG tablet Take 1 tablet (10 mg total) by mouth at bedtime. (Patient not taking: Reported on 01/11/2024)   [DISCONTINUED] ezetimibe  (ZETIA ) 10 MG tablet Take 1 tablet (10 mg total)  by mouth daily.   No facility-administered encounter medications on file as of 01/11/2024.    ALLERGIES: Allergies  Allergen Reactions   Latex Itching and Rash   Morphine  Other (See Comments)    Reaction with esophagus, unable to swallow.    Morphine  And Codeine Shortness Of Breath and Swelling   Other Itching, Shortness Of Breath and Swelling   Ace Inhibitors Cough   Aspirin Other (See Comments)    Reaction with esophagus, unable to swallow.    Losartan Cough   Statins Other (See Comments)    Elevation of liver enzymes   Tapentadol  Other (See Comments)    Nausea, increased sleepiness, h/a   Adhesive [Tape] Rash   Tomato Other (See Comments)    Acid reflux due to acid in tomato    VACCINATION STATUS: Immunization History  Administered Date(s) Administered   Influenza Split 03/30/2011   Influenza Whole 03/21/2005, 02/09/2006   Influenza, Seasonal, Injecte, Preservative Fre 03/07/2023, 01/01/2024   Influenza,inj,Quad PF,6+ Mos 02/11/2014, 07/27/2015, 03/09/2016, 01/29/2017, 02/07/2018, 01/30/2019, 02/11/2020, 03/03/2021, 12/29/2021   Influenza-Unspecified 01/29/2017   PFIZER(Purple Top)SARS-COV-2 Vaccination 07/22/2019, 08/12/2019, 03/05/2020, 11/19/2020   Pneumococcal Polysaccharide-23 04/17/2006, 09/14/2015   Td 04/17/2006   Tdap 12/19/2022   Zoster  Recombinant(Shingrix) 09/22/2022, 10/04/2022    Diabetes She presents for her follow-up diabetic visit. She has type 2 diabetes mellitus. Her disease course has been improving. There are no hypoglycemic associated symptoms. Pertinent negatives for hypoglycemia include no confusion, headaches, pallor or seizures. Associated symptoms include fatigue. Pertinent negatives for diabetes include no blurred vision, no chest pain, no polydipsia, no polyphagia, no polyuria and no weight loss. There are no hypoglycemic complications. Symptoms are improving. Diabetic complications include heart disease, nephropathy and peripheral neuropathy. Risk factors for coronary artery disease include dyslipidemia, diabetes mellitus, family history, hypertension, obesity, sedentary lifestyle, tobacco exposure and post-menopausal. Current diabetic treatment includes insulin  injections and oral agent (monotherapy) (and mounjaro ). She is compliant with treatment most of the time. Her weight is fluctuating minimally. She is following a generally healthy diet. When asked about meal planning, she reported none. She has had a previous visit with a dietitian. She never participates in exercise. Her home blood glucose trend is decreasing steadily. Her breakfast blood glucose range is generally 110-130 mg/dl. Her bedtime blood glucose range is generally 130-140 mg/dl. (She presents today with her logs, and meter showing improving glycemic profile.  Her POCT A1c today is 6.9%, improving from last visit of 7.2%.   She continues to tolerate the Mounjaro  well, no unpleasant SE.  She has been skipping her night time doses of insulin  due to fear of hypoglycemia as readings have been at goal.) An ACE inhibitor/angiotensin II receptor blocker is contraindicated. She does not see a podiatrist.Eye exam is not current.  Hyperlipidemia This is a chronic problem. The current episode started more than 1 year ago. The problem is controlled. Recent lipid  tests were reviewed and are normal. Exacerbating diseases include chronic renal disease, diabetes, hypothyroidism and obesity. Factors aggravating her hyperlipidemia include beta blockers and thiazides. Pertinent negatives include no chest pain, myalgias or shortness of breath. Current antihyperlipidemic treatment includes statins. The current treatment provides mild improvement of lipids. Compliance problems include adherence to diet and adherence to exercise.  Risk factors for coronary artery disease include diabetes mellitus, dyslipidemia, hypertension, obesity and a sedentary lifestyle.  Hypertension This is a chronic problem. The current episode started more than 1 year ago. The problem has been gradually improving since onset. The problem is  controlled. Associated symptoms include peripheral edema. Pertinent negatives include no blurred vision, chest pain, headaches, palpitations or shortness of breath. Agents associated with hypertension include thyroid  hormones. Risk factors for coronary artery disease include family history, dyslipidemia, diabetes mellitus, obesity, sedentary lifestyle and post-menopausal state. Past treatments include calcium  channel blockers, diuretics and beta blockers. The current treatment provides moderate improvement. Compliance problems include diet and exercise.  Hypertensive end-organ damage includes kidney disease, CAD/MI and heart failure. Identifiable causes of hypertension include chronic renal disease and sleep apnea.    Review of systems  Constitutional: +stable body weight,  current Body mass index is 51.91 kg/m. , + fatigue, no subjective hyperthermia, no subjective hypothermia Eyes: no blurry vision, no xerophthalmia ENT: no sore throat, no nodules palpated in throat, no dysphagia/odynophagia, no hoarseness Cardiovascular: no chest pain, no shortness of breath, no palpitations, no leg swelling Respiratory: no cough, no SOB Gastrointestinal: no  nausea/vomiting/diarrhea Musculoskeletal: no muscle/joint aches, walks with cane d/t hx of falls Skin: no rashes, no hyperemia Neurological: no tremors, no numbness, no tingling, no dizziness Psychiatric: no depression, no anxiety,    Objective:    BP 114/80 (BP Location: Left Arm, Patient Position: Sitting, Cuff Size: Large)   Pulse 83   Ht 5' (1.524 m)   Wt 265 lb 12.8 oz (120.6 kg)   BMI 51.91 kg/m   Wt Readings from Last 3 Encounters:  01/11/24 265 lb 12.8 oz (120.6 kg)  01/01/24 265 lb (120.2 kg)  11/20/23 250 lb (113.4 kg)    BP Readings from Last 3 Encounters:  01/11/24 114/80  01/01/24 110/72  10/10/23 (!) 157/82     Physical Exam- Limited  Constitutional:  Body mass index is 51.91 kg/m. , not in acute distress, normal state of mind Eyes:  EOMI, no exophthalmos Musculoskeletal: no gross deformities, strength intact in all four extremities, no gross restriction of joint movements, walks with cane due to disequilibrium and hx of falls Skin:  no rashes, no hyperemia Neurological: no tremor with outstretched hands   CMP ( most recent) CMP     Component Value Date/Time   NA 141 05/14/2023 0926   K 4.4 05/14/2023 0926   CL 101 05/14/2023 0926   CO2 23 05/14/2023 0926   GLUCOSE 114 (H) 05/14/2023 0926   GLUCOSE 316 (H) 02/11/2020 1111   BUN 25 05/14/2023 0926   CREATININE 1.07 (H) 05/14/2023 0926   CREATININE 0.91 02/11/2020 1111   CALCIUM  10.2 05/14/2023 0926   PROT 7.7 05/14/2023 0926   ALBUMIN 4.6 05/14/2023 0926   AST 31 05/14/2023 0926   ALT 26 05/14/2023 0926   ALKPHOS 44 05/14/2023 0926   BILITOT 0.3 05/14/2023 0926   GFRNONAA 70 05/06/2020 1201   GFRNONAA 69 02/11/2020 1111   GFRAA 80 05/06/2020 1201   GFRAA 80 02/11/2020 1111    Diabetic Labs (most recent): Lab Results  Component Value Date   HGBA1C 7.3 (A) 01/11/2024   HGBA1C 6.9 (A) 09/25/2023   HGBA1C 7.2 (A) 05/18/2023   MICROALBUR 101.9 (H) 11/10/2019   MICROALBUR 2.9 10/08/2017    MICROALBUR 33.7 (H) 07/06/2017     Lipid Panel ( most recent) Lipid Panel     Component Value Date/Time   CHOL 142 06/20/2023 0943   TRIG 85 06/20/2023 0943   HDL 44 06/20/2023 0943   CHOLHDL 3.2 06/20/2023 0943   CHOLHDL 4.0 11/10/2019 1303   VLDL 16 05/06/2018 0335   LDLCALC 82 06/20/2023 0943   LDLCALC 97 11/10/2019 1303  Assessment & Plan:   1) Controlled type 2 diabetes mellitus with CKD stage 3a (HCC)  - Angela [name] has currently uncontrolled symptomatic type 2 DM since  63 years of age.  She presents today with her logs, and meter showing improving glycemic profile.  Her POCT A1c today is 6.9%, improving from last visit of 7.2%.   She continues to tolerate the Mounjaro  well, no unpleasant SE.  She has been skipping her night time doses of insulin  due to fear of hypoglycemia as readings have been at goal.   Recent labs reviewed.  -her diabetes is complicated by coronary artery disease, CHF, obesity/sedentary life and she remains at a high risk for more acute and chronic complications which include CAD, CVA, CKD, retinopathy, and neuropathy. These are all discussed in detail with her.  The following Lifestyle Medicine recommendations according to American College of Lifestyle Medicine [facility]) were discussed and offered to patient and she agrees to start the journey:  A. Whole Foods, Plant-based plate comprising of fruits and vegetables, plant-based proteins, whole-grain carbohydrates was discussed in detail with the patient.   A list for source of those nutrients were also provided to the patient.  Patient will use only water  or unsweetened tea for hydration. B.  The need to stay away from risky substances including alcohol, smoking; obtaining 7 to 9 hours of restorative sleep, at least 150 minutes of moderate intensity exercise weekly, the importance of healthy social connections,  and stress reduction techniques were discussed. C.  A full color page of  Calorie density  of various food groups per pound showing examples of each food groups was provided to the patient.  - Nutritional counseling repeated at each appointment due to patients tendency to fall back in to old habits.  - The patient admits there is a room for improvement in their diet and drink choices. -  Suggestion is made for the patient to avoid simple carbohydrates from their diet including Cakes, Sweet Desserts / Pastries, Ice Cream, Soda (diet and regular), Sweet Tea, Candies, Chips, Cookies, Sweet Pastries, Store Bought Juices, Alcohol in Excess of 1-2 drinks a day, Artificial Sweeteners, Coffee Creamer, and Sugar-free Products. This will help patient to have stable blood glucose profile and potentially avoid unintended weight gain.   - I encouraged the patient to switch to unprocessed or minimally processed complex starch and increased protein intake (animal or plant source), fruits, and vegetables.   - Patient is advised to stick to a routine mealtimes to eat 3 meals a day and avoid unnecessary snacks (to snack only to correct hypoglycemia).  - I have approached her with the following individualized plan to manage diabetes and patient agrees:   -Given her presentation with chronic glycemic burden, she will likely require multiple daily injections of insulin  in order for her to achieve control of diabetes to target.  -She can stop her premixed insulin  today given at goal readings.  Will increase her Mounjaro  to 12.5 mg SQ weekly.  She can also continue Metformin  500 mg po twice daily for now.   -She is encouraged to consistently monitor glucose at least once daily, before breakfast and to call the clinic if she has readings less than 70 or greater than 300 for 3 tests in a row.  She could not tolerate the Dexcom G7 (had allergic reaction to adhesive).  2) Blood Pressure /Hypertension: Her blood pressure is controlled to target.  She is advised to continue her medications as prescribed by  PCP.  3) Lipids/Hyperlipidemia:  Her most recent lipid panel from 06/20/23 shows controlled LDL at 82.  She could not tolerate the Lipitor due to elevated LFTs and could not tolerate the Repatha  either  4)  Weight/Diet:  Her Body mass index is 51.91 kg/m. - clearly complicating her diabetes care.  She is a candidate for modest weight loss.  She has multiple comorbid situations complicating her chance of bariatric surgery.  I discussed with her the fact that loss of 5 - 10% of her  current body weight will have the most impact on her diabetes management.  CDE Consult will be initiated . Exercise, and detailed carbohydrates information provided  -  detailed on discharge instructions.  5) Hypothyroidism:  There are no recent TFTs to review.  She is advised to continue Levothyroxine  75 mcg po daily before breakfast. Will recheck TFTs prior to next visit and adjust dose accordingly.   - We discussed about the correct intake of her thyroid  hormone, on empty stomach at fasting, with water , separated by at least 30 minutes from breakfast and other medications,  and separated by more than 4 hours from calcium , iron, multivitamins, acid reflux medications (PPIs). -Patient is made aware of the fact that thyroid  hormone replacement is needed for life, dose to be adjusted by periodic monitoring of thyroid  function tests.  6) Chronic Care/Health Maintenance: -she is not on ACE/ARB (intolerant to both) or statin medications (due to elevated LFTs) and is encouraged to initiate and continue to follow up with Ophthalmology, Dentist,  Podiatrist at least yearly or according to recommendations, and advised to  stay away from smoking. I have recommended yearly flu vaccine and pneumonia vaccine at least every 5 years; moderate intensity exercise for up to 150 minutes weekly; and  sleep for at least 7 hours a day.  - she is advised to maintain close follow up with Antonetta Rollene BRAVO, MD for primary care needs, as well as  her other providers for optimal and coordinated care.     I spent  36  minutes in the care of the patient today including review of labs from CMP, Lipids, Thyroid  Function, Hematology (current and previous including abstractions from other facilities); face-to-face time discussing  her blood glucose readings/logs, discussing hypoglycemia and hyperglycemia episodes and symptoms, medications doses, her options of short and long term treatment based on the latest standards of care / guidelines;  discussion about incorporating lifestyle medicine;  and documenting the encounter. Risk reduction counseling performed per USPSTF guidelines to reduce obesity and cardiovascular risk factors.     Please refer to Patient Instructions for Blood Glucose Monitoring and Insulin /Medications Dosing Guide  in media tab for additional information. Please  also refer to  Patient Self Inventory in the Media  tab for reviewed elements of pertinent patient history.  Angela [name] participated in the discussions, expressed understanding, and voiced agreement with the above plans.  All questions were answered to her satisfaction. she is encouraged to contact clinic should she have any questions or concerns prior to her return visit.   Follow up plan: - No follow-ups on file.   Benton Rio, [facility] [address] [place], [place] [address] Phone: [phone] Fax: [phone]   01/11/2024, 9:25 AM

## 2024-01-11 NOTE — Progress Notes (Signed)
 01/11/2024, 9:46 AM                    Endocrinology follow-up note   Subjective:    [name], female    DOB: [birth year].  Angela [name] is being seen in follow-up in the management of currently uncontrolled symptomatic type 2 diabetes.  She also has comorbid dyslipidemia, hypertension.   PMD:  Antonetta Rollene BRAVO, MD.   Past Medical History:  Diagnosis Date   Allergy    Phreesia 06/02/2020   Anxiety    Arthritis    Asthma    Asthma    Phreesia 04/12/2020   Breast mass    left (monitor)   CHF (congestive heart failure) (HCC)    Phreesia 04/12/2020   Coronary atherosclerosis of native coronary artery    Mild nonobstructive CAD by cardiac catheterization 2008 - Dr. Levern   Degenerative disc disease    Depression    Depression    Phreesia 04/12/2020   Diabetes mellitus without complication (HCC)    Phreesia 04/12/2020   Dysrhythmia    Emphysema of lung (HCC)    Essential hypertension, benign    Folliculitis 01/21/2015   GERD (gastroesophageal reflux disease)    H/O hiatal hernia    Helicobacter pylori gastritis 2008   Hypercholesterolemia    Hypertension    Hyperthyroidism    s/p radiation   Left-sided epistaxis 05/15/2018   Low back pain    Migraine    Myocardial infarction (HCC)    Nephrolithiasis    Recurring episodes since 2004   Neuromuscular disorder (HCC)    Phreesia 04/12/2020   Osteoporosis    Oxygen  deficiency    Severe obesity (BMI >= 40) (HCC)    Sleep apnea    STOP BANG SCORE 6no cpap used   Thyroid  disease    Phreesia 04/12/2020   Type 2 diabetes mellitus with diabetic neuropathy (HCC)     Past Surgical History:  Procedure Laterality Date   ABDOMINAL HYSTERECTOMY     BACK SURGERY  1993   BACK SURGERY  1994   BACK SURGERY  2002   BACK SURGERY  2011   [facility]    CARPAL TUNNEL RELEASE Left    CHOLECYSTECTOMY  1996   COLONOSCOPY  2000 [facility]   INT HEMORRHOIDS/FISSURE   COLONOSCOPY  2003  [facility], CHANGE IN BOWEL HABITS   INT HEMORRHOIDS   COLONOSCOPY  2006 [facility]   INT HEMORRHOIDS   COLONOSCOPY  2007 [facility]   INT HEMORRHOIDS   CYSTOSCOPY WITH STENT PLACEMENT Left 07/25/2012   Procedure: CYSTOSCOPY, left retograde pyelogram WITH left ureteral  STENT PLACEMENT;  Surgeon: Oneil JAYSON Rafter, MD;  Location: [facility];  Service: Urology;  Laterality: Left;   CYSTOSCOPY/RETROGRADE/URETEROSCOPY  12/04/2011   Procedure: CYSTOSCOPY/RETROGRADE/URETEROSCOPY;  Surgeon: Toribio Neysa Repine, MD;  Location: [facility];  Service: Urology;  Laterality: Right;   CYSTOSCOPY/RETROGRADE/URETEROSCOPY/STONE EXTRACTION WITH BASKET Left 09/03/2012   Procedure: CYSTOSCOPY/RETROGRADE pyelogram/digital URETEROSCOPY/STONE EXTRACTION WITH BASKET, left stent removal;  Surgeon: Toribio Neysa Repine, MD;  Location: [facility];  Service: Urology;  Laterality: Left;   ESOPHAGOGASTRODUODENOSCOPY  2008   DOQ:Dfjoo hiatal hernia./Normal esophagus without evidence of Barrett's mass, stricture, erosion or ulceration./Normal duodenal bulb and second portion of the duodenum./Diffuse erythema in the body and the antrum with occasional erosion.  Biopsies obtained via cold forceps to evaluate for H. pylori gastritis   FRACTURE SURGERY  1999   right clavicle   HERNIA REPAIR  HOLMIUM LASER APPLICATION Left 09/03/2012   Procedure: HOLMIUM LASER APPLICATION;  Surgeon: Toribio Neysa Repine, MD;  Location: [facility];  Service: Urology;  Laterality: Left;   KNEE ARTHROSCOPY  10/04/2010   right knee arthroscopy, dr gerome   LEFT HEART CATH AND CORONARY ANGIOGRAPHY N/A 05/08/2018   Procedure: LEFT HEART CATH AND CORONARY ANGIOGRAPHY;  Surgeon: Burnard Debby LABOR, MD;  Location: [facility];  Service: Cardiovascular;  Laterality: N/A;   left knee arthroscopic surgery  1999   Left salphingectomy secondary to ectopic pregnancy  1991   PARTIAL HYSTERECTOMY  1991   RIGHT HEART CATH N/A 07/07/2019   Procedure: RIGHT HEART CATH;   Surgeon: Anner Alm ORN, MD;  Location: [facility] CV [facility];  Service: Cardiovascular;  Laterality: N/A;   rotary cuff  Right 05/14/2014   [facility]   SPINE SURGERY N/A    Phreesia 06/02/2020   UPPER GASTROINTESTINAL ENDOSCOPY  2008 abd pain   H. pylori gastritis   UPPER GASTROINTESTINAL ENDOSCOPY  1996 [facility] [place]   [facility]    Social History   Socioeconomic History   Marital status: Single    Spouse name: Not on file   Number of children: 1   Years of education: 12   Highest education level: 12th grade  Occupational History   Occupation: Disable   Tobacco Use   Smoking status: Never   Smokeless tobacco: Never  Vaping Use   Vaping status: Never Used  Substance and Sexual Activity   Alcohol use: No   Drug use: No   Sexual activity: Not Currently    Partners: Male    Birth control/protection: Surgical, None    Comment: hyst  Other Topics Concern   Not on file  Social History Narrative   Lives alone    Social Drivers of Health   Financial Resource Strain: Low Risk  (11/19/2023)   Overall Financial Resource Strain (CARDIA)    Difficulty of Paying Living Expenses: Not hard at all  Food Insecurity: No Food Insecurity (11/19/2023)   Hunger Vital Sign    Worried About Running Out of Food in the Last Year: Never true    Ran Out of Food in the Last Year: Never true  Transportation Needs: No Transportation Needs (11/19/2023)   PRAPARE - Administrator, Civil Service (Medical): No    Lack of Transportation (Non-Medical): No  Physical Activity: Sufficiently Active (11/19/2023)   Exercise Vital Sign    Days of Exercise per Week: 6 days    Minutes of Exercise per Session: 30 min  Stress: Patient Declined (11/19/2023)   [employer] of Occupational Health - Occupational Stress Questionnaire    Feeling of Stress: Patient declined  Social Connections: Moderately Integrated (11/19/2023)   Social Connection and Isolation Panel    Frequency of Communication with Friends  and Family: More than three times a week    Frequency of Social Gatherings with Friends and Family: Three times a week    Attends Religious Services: 1 to 4 times per year    Active Member of Clubs or Organizations: No    Attends Engineer, structural: More than 4 times per year    Marital Status: Divorced    Family History  Problem Relation Age of Onset   Heart disease Mother    Hypertension Mother    Cancer Mother        Cervical    Asthma Mother    Heart attack Father    Cancer  Father        Prostate   Colon cancer Father        DECEASED AGE 73   Cancer Sister    Colon polyps Neg Hx     Outpatient Encounter Medications as of 01/11/2024  Medication Sig   albuterol  (VENTOLIN  HFA) 108 (90 Base) MCG/ACT inhaler Inhale 2 puffs into the lungs every 6 (six) hours as needed for wheezing or shortness of breath.   amLODipine  (NORVASC ) 10 MG tablet TAKE 1 TABLET BY MOUTH EVERY DAY   B-D UF III MINI PEN NEEDLES 31G X 5 MM MISC Use to inject insulin  twice daily   betamethasone, augmented, (DIPROLENE) 0.05 % lotion Apply 1 application. topically 2 (two) times daily.   blood glucose meter kit and supplies Dispense based on patient and insurance preference. Once daily testing dx. e11.9   calcipotriene (DOVONOX) 0.005 % ointment Apply 1 application topically daily.    clobetasol cream (TEMOVATE) 0.05 % Apply 1 application. topically 2 (two) times daily.   cloNIDine  (CATAPRES  - DOSED IN MG/24 HR) 0.3 mg/24hr patch APPLY ONE PATCH TO SKIN ONCE WEEKLY   cloNIDine  (CATAPRES ) 0.3 MG tablet TAKE 1 TABLET (0.3 MG TOTAL) BY MOUTH AT BEDTIME.   cyclobenzaprine  (FLEXERIL ) 10 MG tablet Take 1 tablet (10 mg total) by mouth 3 (three) times daily.   diazepam  (VALIUM ) 5 MG tablet Take one tablet by mouth three times daily for back spasm   DULoxetine  (CYMBALTA ) 60 MG capsule TAKE 1 CAPSULE BY MOUTH 2 TIMES DAILY.   esomeprazole  (NEXIUM ) 40 MG capsule TAKE 1 CAPSULE BY MOUTH EVERY DAY   Evolocumab   (REPATHA ) 140 MG/ML SOSY Inject 140 mg into the skin every 14 (fourteen) days.   fenofibrate  (TRICOR ) 145 MG tablet TAKE 1 TABLET BY MOUTH EVERY DAY   furosemide  (LASIX ) 40 MG tablet Take 1 tablet (40 mg total) by mouth daily.   gabapentin  (NEURONTIN ) 800 MG tablet TAKE ONE TABLET BY MOUTH FOUR TIMES DAILY FOR PAIN   glucose blood (ONETOUCH VERIO) test strip Use to test blood glucose three times daily as directed   hydrocortisone  (PROCTOSOL HC) 2.5 % rectal cream    ipratropium-albuterol  (DUONEB) 0.5-2.5 (3) MG/3ML SOLN Take 3 mLs by nebulization every 6 (six) hours as needed.   KLOR-CON  M20 20 MEQ tablet TAKE 1 TABLET BY MOUTH TWICE A DAY   Lancets (ONETOUCH DELICA PLUS LANCET33G) MISC USE TO TEST THREE TIMES DAILY. DX : E11.65   levothyroxine  (SYNTHROID ) 75 MCG tablet Take 1 tablet (75 mcg total) by mouth daily.   loratadine  (CLARITIN ) 10 MG tablet TAKE 1 TABLET BY MOUTH EVERY DAY   metoprolol  tartrate (LOPRESSOR ) 25 MG tablet Take 1 tablet (25 mg total) by mouth 2 (two) times daily.   naloxone  (NARCAN ) nasal spray 4 mg/0.1 mL One spray into each nostril once as needed   nitroGLYCERIN  (NITROSTAT ) 0.4 MG SL tablet Place 1 tablet (0.4 mg total) under the tongue every 5 (five) minutes as needed for chest pain.   oxyCODONE  (OXYCONTIN ) 40 mg 12 hr tablet Take 1 tablet (40 mg total) by mouth every 12 (twelve) hours.   [START ON 03/01/2024] oxyCODONE  (OXYCONTIN ) 40 mg 12 hr tablet Take 1 tablet (40 mg total) by mouth every 12 (twelve) hours.   rosuvastatin  (CRESTOR ) 10 MG tablet TAKE ONE TABLET BY MOUTH EVERY MONDAY, WEDNESDAY AND FRIDAY   spironolactone  (ALDACTONE ) 25 MG tablet TAKE 1 TABLET (25 MG TOTAL) BY MOUTH DAILY.   SYMBICORT  160-4.5 MCG/ACT inhaler Inhale 2 puffs into  the lungs in the morning and at bedtime.   trimethoprim -polymyxin b  (POLYTRIM ) ophthalmic solution PLACE 1 DROP INTO BOTH EYES EVERY 4 (FOUR) HOURS FOR 7 DAYS. MAX 4 DOSES PER DAY.   UNABLE TO FIND BD Ultra-Fine Mini Pen Needle  31 gauge x 3/16  USE AS DIRECTED TWICE A DAY   UNABLE TO FIND OneTouch Verio test strips  USE AS INSTRUCTED FOUR TIMES DAILY TESTING DX E11.65   UNABLE TO FIND OneTouch Delica Plus Lancet 33 gauge  USE TO TEST ONCE DAILY   UNABLE TO FIND Med Name:  Nutritional supplement 225 units/m or PRN Incontinence pads   264/m or PRN Underpads/Chux     90/m or PRN Nonsterile gloves  1B/M OR PRN   UNABLE TO FIND Med Name: Step stool with handle   UNABLE TO FIND Med Name:  BEDSIDE STEP STOOL WITH HANDLE   UNABLE TO FIND Med Name:  Under pads/CHUX                      90/M OR PRN NUTRITIONAL SUPPLEMENT  225U/M OR PRN INCONTINENCE PADS              264/M OR PRN NONSTERILE GLOVES             1B/M OR PRN WIPES                                         2PKG/M   UNABLE TO FIND Med Name: LIFT CHAIR  DX: R29.898   UNABLE TO FIND Med Name: LIFT CHAIR   UNABLE TO FIND Med Name: Danae Roughen DX: R26.89   Vitamin D , Ergocalciferol , (DRISDOL ) 1.25 MG (50000 UNIT) CAPS capsule TAKE 1 CAPSULE (50,000 UNITS TOTAL) BY MOUTH ONCE A WEEK. ONE CAPSULE ONCE WEEKLY   zolpidem  (AMBIEN ) 10 MG tablet Take 1 tablet (10 mg total) by mouth at bedtime.   [DISCONTINUED] metFORMIN  (GLUCOPHAGE ) 500 MG tablet Take 1 tablet (500 mg total) by mouth 2 (two) times daily with a meal. (Patient taking differently: Take 500 mg by mouth daily with breakfast.)   [DISCONTINUED] tirzepatide  (MOUNJARO ) 12.5 MG/0.5ML Pen Inject 12.5 mg into the skin once a week.   metFORMIN  (GLUCOPHAGE ) 500 MG tablet Take 1 tablet (500 mg total) by mouth daily with breakfast.   oxyCODONE  (OXYCONTIN ) 40 mg 12 hr tablet Take 1 tablet (40 mg total) by mouth every 12 (twelve) hours. (Patient not taking: Reported on 01/11/2024)   oxyCODONE  (OXYCONTIN ) 40 mg 12 hr tablet Take pone tablet by mouth two times daily for pain (Patient not taking: Reported on 01/11/2024)   oxyCODONE  (OXYCONTIN ) 40 mg 12 hr tablet Take one tablet by mouth two times daily (Patient not taking:  Reported on 01/11/2024)   oxyCODONE  (OXYCONTIN ) 40 mg 12 hr tablet Take one tablet by mouth two times daily (Patient not taking: Reported on 01/11/2024)   [START ON 01/31/2024] oxyCODONE  (OXYCONTIN ) 40 mg 12 hr tablet Take one tablet by mouth twice daily for pain (Patient not taking: Reported on 01/11/2024)   [START ON 03/31/2024] oxyCODONE  (OXYCONTIN ) 40 mg 12 hr tablet Take one tablet by mouth two times daily for pain   tirzepatide  (MOUNJARO ) 12.5 MG/0.5ML Pen Inject 12.5 mg into the skin once a week.   zolpidem  (AMBIEN ) 10 MG tablet Take 1 tablet (10 mg total) by mouth at bedtime. (Patient not taking: Reported on 01/11/2024)  zolpidem  (AMBIEN ) 10 MG tablet Take 1 tablet (10 mg total) by mouth at bedtime. (Patient not taking: Reported on 01/11/2024)   [DISCONTINUED] ezetimibe  (ZETIA ) 10 MG tablet Take 1 tablet (10 mg total) by mouth daily.   No facility-administered encounter medications on file as of 01/11/2024.    ALLERGIES: Allergies  Allergen Reactions   Latex Itching and Rash   Morphine  Other (See Comments)    Reaction with esophagus, unable to swallow.    Morphine  And Codeine Shortness Of Breath and Swelling   Other Itching, Shortness Of Breath and Swelling   Ace Inhibitors Cough   Aspirin Other (See Comments)    Reaction with esophagus, unable to swallow.    Losartan Cough   Statins Other (See Comments)    Elevation of liver enzymes   Tapentadol  Other (See Comments)    Nausea, increased sleepiness, h/a   Adhesive [Tape] Rash   Tomato Other (See Comments)    Acid reflux due to acid in tomato    VACCINATION STATUS: Immunization History  Administered Date(s) Administered   Influenza Split 03/30/2011   Influenza Whole 03/21/2005, 02/09/2006   Influenza, Seasonal, Injecte, Preservative Fre 03/07/2023, 01/01/2024   Influenza,inj,Quad PF,6+ Mos 02/11/2014, 07/27/2015, 03/09/2016, 01/29/2017, 02/07/2018, 01/30/2019, 02/11/2020, 03/03/2021, 12/29/2021   Influenza-Unspecified  01/29/2017   PFIZER(Purple Top)SARS-COV-2 Vaccination 07/22/2019, 08/12/2019, 03/05/2020, 11/19/2020   Pneumococcal Polysaccharide-23 04/17/2006, 09/14/2015   Td 04/17/2006   Tdap 12/19/2022   Zoster Recombinant(Shingrix) 09/22/2022, 10/04/2022    Diabetes She presents for her follow-up diabetic visit. She has type 2 diabetes mellitus. Her disease course has been improving. There are no hypoglycemic associated symptoms. Pertinent negatives for hypoglycemia include no confusion, headaches, pallor or seizures. Associated symptoms include fatigue. Pertinent negatives for diabetes include no blurred vision, no chest pain, no polydipsia, no polyphagia, no polyuria and no weight loss. There are no hypoglycemic complications. Symptoms are improving. Diabetic complications include heart disease, nephropathy and peripheral neuropathy. Risk factors for coronary artery disease include dyslipidemia, diabetes mellitus, family history, hypertension, obesity, sedentary lifestyle, tobacco exposure and post-menopausal. Current diabetic treatment includes oral agent (monotherapy) (and mounjaro ). She is compliant with treatment most of the time. Her weight is fluctuating minimally. She is following a generally healthy diet. When asked about meal planning, she reported none. She has had a previous visit with a dietitian. She never participates in exercise. Her home blood glucose trend is fluctuating minimally. Her breakfast blood glucose range is generally 130-140 mg/dl. (She presents today with her logs, and meter showing improving glycemic profile.  Her POCT A1c today is 7.3%, increasing from last visit of 6.9%.  She did go on vacation this summer and admits to eating more than she should but is getting back on track now that she is at home.  She denies any hypoglycemia or unpleasant side effects from Mounjaro .  Analysis of her meter shows 7-day average of 137, 14-day average of 138, 30-day average of 144, 90-day average of  161.) An ACE inhibitor/angiotensin II receptor blocker is contraindicated. She does not see a podiatrist.Eye exam is not current.  Hyperlipidemia This is a chronic problem. The current episode started more than 1 year ago. The problem is controlled. Recent lipid tests were reviewed and are normal. Exacerbating diseases include chronic renal disease, diabetes, hypothyroidism and obesity. Factors aggravating her hyperlipidemia include beta blockers and thiazides. Pertinent negatives include no chest pain, myalgias or shortness of breath. Current antihyperlipidemic treatment includes statins. The current treatment provides mild improvement of lipids. Compliance problems include adherence  to diet and adherence to exercise.  Risk factors for coronary artery disease include diabetes mellitus, dyslipidemia, hypertension, obesity and a sedentary lifestyle.  Hypertension This is a chronic problem. The current episode started more than 1 year ago. The problem has been gradually improving since onset. The problem is controlled. Associated symptoms include peripheral edema. Pertinent negatives include no blurred vision, chest pain, headaches, palpitations or shortness of breath. Agents associated with hypertension include thyroid  hormones. Risk factors for coronary artery disease include family history, dyslipidemia, diabetes mellitus, obesity, sedentary lifestyle and post-menopausal state. Past treatments include calcium  channel blockers, diuretics and beta blockers. The current treatment provides moderate improvement. Compliance problems include diet and exercise.  Hypertensive end-organ damage includes kidney disease, CAD/MI and heart failure. Identifiable causes of hypertension include chronic renal disease and sleep apnea.    Review of systems  Constitutional: +stable body weight,  current Body mass index is 51.91 kg/m. , + fatigue, no subjective hyperthermia, no subjective hypothermia Eyes: no blurry vision, no  xerophthalmia ENT: no sore throat, no nodules palpated in throat, no dysphagia/odynophagia, no hoarseness Cardiovascular: no chest pain, no shortness of breath, no palpitations, no leg swelling Respiratory: no cough, no SOB Gastrointestinal: no nausea/vomiting/diarrhea Musculoskeletal: no muscle/joint aches, walks with cane d/t hx of falls but has not needed it recently Skin: no rashes, no hyperemia Neurological: no tremors, no numbness, no tingling, no dizziness Psychiatric: no depression, no anxiety,    Objective:    BP 114/80 (BP Location: Left Arm, Patient Position: Sitting, Cuff Size: Large)   Pulse 83   Ht 5' (1.524 m)   Wt 265 lb 12.8 oz (120.6 kg)   BMI 51.91 kg/m   Wt Readings from Last 3 Encounters:  01/11/24 265 lb 12.8 oz (120.6 kg)  01/01/24 265 lb (120.2 kg)  11/20/23 250 lb (113.4 kg)    BP Readings from Last 3 Encounters:  01/11/24 114/80  01/01/24 110/72  10/10/23 (!) 157/82     Physical Exam- Limited  Constitutional:  Body mass index is 51.91 kg/m. , not in acute distress, normal state of mind Eyes:  EOMI, no exophthalmos Musculoskeletal: no gross deformities, strength intact in all four extremities, no gross restriction of joint movements, walks with cane due to disequilibrium and hx of falls- but has not needed it recently Skin:  no rashes, no hyperemia Neurological: no tremor with outstretched hands   CMP ( most recent) CMP     Component Value Date/Time   NA 141 05/14/2023 0926   K 4.4 05/14/2023 0926   CL 101 05/14/2023 0926   CO2 23 05/14/2023 0926   GLUCOSE 114 (H) 05/14/2023 0926   GLUCOSE 316 (H) 02/11/2020 1111   BUN 25 05/14/2023 0926   CREATININE 1.07 (H) 05/14/2023 0926   CREATININE 0.91 02/11/2020 1111   CALCIUM  10.2 05/14/2023 0926   PROT 7.7 05/14/2023 0926   ALBUMIN 4.6 05/14/2023 0926   AST 31 05/14/2023 0926   ALT 26 05/14/2023 0926   ALKPHOS 44 05/14/2023 0926   BILITOT 0.3 05/14/2023 0926   GFRNONAA 70 05/06/2020 1201    GFRNONAA 69 02/11/2020 1111   GFRAA 80 05/06/2020 1201   GFRAA 80 02/11/2020 1111    Diabetic Labs (most recent): Lab Results  Component Value Date   HGBA1C 7.3 (A) 01/11/2024   HGBA1C 6.9 (A) 09/25/2023   HGBA1C 7.2 (A) 05/18/2023   MICROALBUR 101.9 (H) 11/10/2019   MICROALBUR 2.9 10/08/2017   MICROALBUR 33.7 (H) 07/06/2017     Lipid Panel (  most recent) Lipid Panel     Component Value Date/Time   CHOL 142 06/20/2023 0943   TRIG 85 06/20/2023 0943   HDL 44 06/20/2023 0943   CHOLHDL 3.2 06/20/2023 0943   CHOLHDL 4.0 11/10/2019 1303   VLDL 16 05/06/2018 0335   LDLCALC 82 06/20/2023 0943   LDLCALC 97 11/10/2019 1303      Assessment & Plan:   1) Controlled type 2 diabetes mellitus with CKD stage 3a (HCC)  - Angela [name] has currently uncontrolled symptomatic type 2 DM since  63 years of age.  She presents today with her logs, and meter showing improving glycemic profile.  Her POCT A1c today is 7.3%, increasing from last visit of 6.9%.  She did go on vacation this summer and admits to eating more than she should but is getting back on track now that she is at home.  She denies any hypoglycemia or unpleasant side effects from Mounjaro .  Analysis of her meter shows 7-day average of 137, 14-day average of 138, 30-day average of 144, 90-day average of 161.   Recent labs reviewed.  -her diabetes is complicated by coronary artery disease, CHF, obesity/sedentary life and she remains at a high risk for more acute and chronic complications which include CAD, CVA, CKD, retinopathy, and neuropathy. These are all discussed in detail with her.  The following Lifestyle Medicine recommendations according to American College of Lifestyle Medicine [facility]) were discussed and offered to patient and she agrees to start the journey:  A. Whole Foods, Plant-based plate comprising of fruits and vegetables, plant-based proteins, whole-grain carbohydrates was discussed in detail with the patient.    A list for source of those nutrients were also provided to the patient.  Patient will use only water  or unsweetened tea for hydration. B.  The need to stay away from risky substances including alcohol, smoking; obtaining 7 to 9 hours of restorative sleep, at least 150 minutes of moderate intensity exercise weekly, the importance of healthy social connections,  and stress reduction techniques were discussed. C.  A full color page of  Calorie density of various food groups per pound showing examples of each food groups was provided to the patient.  - Nutritional counseling repeated at each appointment due to patients tendency to fall back in to old habits.  - The patient admits there is a room for improvement in their diet and drink choices. -  Suggestion is made for the patient to avoid simple carbohydrates from their diet including Cakes, Sweet Desserts / Pastries, Ice Cream, Soda (diet and regular), Sweet Tea, Candies, Chips, Cookies, Sweet Pastries, Store Bought Juices, Alcohol in Excess of 1-2 drinks a day, Artificial Sweeteners, Coffee Creamer, and Sugar-free Products. This will help patient to have stable blood glucose profile and potentially avoid unintended weight gain.   - I encouraged the patient to switch to unprocessed or minimally processed complex starch and increased protein intake (animal or plant source), fruits, and vegetables.   - Patient is advised to stick to a routine mealtimes to eat 3 meals a day and avoid unnecessary snacks (to snack only to correct hypoglycemia).  - I have approached her with the following individualized plan to manage diabetes and patient agrees:   -Given her presentation with chronic glycemic burden, she will likely require multiple daily injections of insulin  in order for her to achieve control of diabetes to target.  -She is advised to continue Mounjaro  12.5 mg SQ weekly and Metformin  500 mg po daily for now.   -  She is encouraged to monitor glucose  at least once-twice per week, before breakfast and to call the clinic if she has readings less than 70 or greater than 300 for 3 tests in a row.  She could not tolerate the Dexcom G7 (had allergic reaction to adhesive).  2) Blood Pressure /Hypertension: Her blood pressure is controlled to target.  She is advised to continue her medications as prescribed by PCP.  3) Lipids/Hyperlipidemia:  Her most recent lipid panel from 06/20/23 shows controlled LDL at 82.  She could not tolerate the Lipitor due to elevated LFTs and could not tolerate the Repatha  either  4)  Weight/Diet:  Her Body mass index is 51.91 kg/m. - clearly complicating her diabetes care.  She is a candidate for modest weight loss.  She has multiple comorbid situations complicating her chance of bariatric surgery.  I discussed with her the fact that loss of 5 - 10% of her  current body weight will have the most impact on her diabetes management.  CDE Consult will be initiated . Exercise, and detailed carbohydrates information provided  -  detailed on discharge instructions.  5) Hypothyroidism:  Her previsit TFTs are consistent with appropriate hormone repalcement.  She is advised to continue Levothyroxine  75 mcg po daily before breakfast.    - We discussed about the correct intake of her thyroid  hormone, on empty stomach at fasting, with water , separated by at least 30 minutes from breakfast and other medications,  and separated by more than 4 hours from calcium , iron, multivitamins, acid reflux medications (PPIs). -Patient is made aware of the fact that thyroid  hormone replacement is needed for life, dose to be adjusted by periodic monitoring of thyroid  function tests.  6) Chronic Care/Health Maintenance: -she is not on ACE/ARB (intolerant to both) or statin medications (due to elevated LFTs) and is encouraged to initiate and continue to follow up with Ophthalmology, Dentist,  Podiatrist at least yearly or according to recommendations,  and advised to  stay away from smoking. I have recommended yearly flu vaccine and pneumonia vaccine at least every 5 years; moderate intensity exercise for up to 150 minutes weekly; and  sleep for at least 7 hours a day.  - she is advised to maintain close follow up with Antonetta Rollene BRAVO, MD for primary care needs, as well as her other providers for optimal and coordinated care.     I spent  30  minutes in the care of the patient today including review of labs from CMP, Lipids, Thyroid  Function, Hematology (current and previous including abstractions from other facilities); face-to-face time discussing  her blood glucose readings/logs, discussing hypoglycemia and hyperglycemia episodes and symptoms, medications doses, her options of short and long term treatment based on the latest standards of care / guidelines;  discussion about incorporating lifestyle medicine;  and documenting the encounter. Risk reduction counseling performed per USPSTF guidelines to reduce obesity and cardiovascular risk factors.     Please refer to Patient Instructions for Blood Glucose Monitoring and Insulin /Medications Dosing Guide  in media tab for additional information. Please  also refer to  Patient Self Inventory in the Media  tab for reviewed elements of pertinent patient history.  Angela [name] participated in the discussions, expressed understanding, and voiced agreement with the above plans.  All questions were answered to her satisfaction. she is encouraged to contact clinic should she have any questions or concerns prior to her return visit.   Follow up plan: - Return in about 4 months (  around 05/12/2024) for Diabetes F/U with A1c in office.   Benton Rio, [facility] [address] [place], [place] [address] Phone: [phone] Fax: [phone]   01/11/2024, 9:46 AM

## 2024-01-14 ENCOUNTER — Other Ambulatory Visit: Payer: Self-pay | Admitting: Primary Care

## 2024-01-14 ENCOUNTER — Ambulatory Visit: Admitting: Primary Care

## 2024-01-14 ENCOUNTER — Encounter: Payer: Self-pay | Admitting: Primary Care

## 2024-01-14 VITALS — BP 132/64 | HR 75 | Temp 97.5°F | Ht 64.0 in | Wt 266.2 lb

## 2024-01-14 DIAGNOSIS — J45909 Unspecified asthma, uncomplicated: Secondary | ICD-10-CM

## 2024-01-14 DIAGNOSIS — G4733 Obstructive sleep apnea (adult) (pediatric): Secondary | ICD-10-CM | POA: Diagnosis not present

## 2024-01-14 DIAGNOSIS — I509 Heart failure, unspecified: Secondary | ICD-10-CM | POA: Diagnosis not present

## 2024-01-14 MED ORDER — FUROSEMIDE 40 MG PO TABS: 40.0000 mg | ORAL_TABLET | Freq: Every day | ORAL | 0 refills | Status: DC

## 2024-01-14 NOTE — Progress Notes (Unsigned)
 @Patient  ID: Angela [name], female    DOB: 07/06/1960, 63 y.o.   MRN: [id number]  Chief Complaint  Patient presents with  . Asthma  . Obstructive Sleep Apnea    Referring provider: Antonetta Rollene BRAVO, MD  HPI:  63  yo female never smoker for follow-up of asthma and  OSA  -on CPAP 10 cm with full facemask , DME - [facility] -on O2 since 2021     01/14/2024 Discussed the use of AI scribe software for clinical note transcription with the patient, who gave verbal consent to proceed.  History of Present Illness Angela [name] is a 63 year old female with obstructive sleep apnea and asthma who presents for a follow-up visit.  She has been using a new CPAP machine since her last visit in June, with settings adjusted to a pressure of 10. She uses the CPAP machine 93% of the time, averaging 6 hours and 13 minutes of use per night. Her apnea score is 2.2. She is satisfied with the new machine, noting it is lighter and uses a nasal mask without significant air leaks.  She uses Symbicort , two puffs in the morning and two puffs in the evening, and has not needed to use her albuterol  recently, although she keeps it on hand. She also takes loratadine  for allergies.  She takes 40 mg of Lasix  once daily and has noticed her weight is coming down, attributing this to her use of Mildura. She has no issues with weight gain or fluid buildup.  She recently returned from a two-week trip to [place], [place] , where she enjoyed herself but noted her A1C increased slightly due to dietary indulgences during the trip.   11/12/23-01/10/24 Usage days 56/60 (93%) Usage hours 6 hours 13 mins Pressure 10cm h20 Airleaks 13.1L/min AHI 2.2   Allergies  Allergen Reactions  . Latex Itching and Rash  . Morphine  Other (See Comments)    Reaction with esophagus, unable to swallow.   . Morphine  And Codeine Shortness Of Breath and Swelling  . Other Itching, Shortness Of Breath and Swelling  . Ace  Inhibitors Cough  . Aspirin Other (See Comments)    Reaction with esophagus, unable to swallow.   . Losartan Cough  . Statins Other (See Comments)    Elevation of liver enzymes  . Tapentadol  Other (See Comments)    Nausea, increased sleepiness, h/a  . Adhesive [Tape] Rash  . Tomato Other (See Comments)    Acid reflux due to acid in tomato    Immunization History  Administered Date(s) Administered  . Influenza Split 03/30/2011  . Influenza Whole 03/21/2005, 02/09/2006  . Influenza, Seasonal, Injecte, Preservative Fre 03/07/2023, 01/01/2024  . Influenza,inj,Quad PF,6+ Mos 02/11/2014, 07/27/2015, 03/09/2016, 01/29/2017, 02/07/2018, 01/30/2019, 02/11/2020, 03/03/2021, 12/29/2021  . Influenza-Unspecified 01/29/2017  . PFIZER(Purple Top)SARS-COV-2 Vaccination 07/22/2019, 08/12/2019, 03/05/2020, 11/19/2020  . Pneumococcal Polysaccharide-23 04/17/2006, 09/14/2015  . Td 04/17/2006  . Tdap 12/19/2022  . Zoster Recombinant(Shingrix) 09/22/2022, 10/04/2022    Past Medical History:  Diagnosis Date  . Allergy    Phreesia 06/02/2020  . Anxiety   . Arthritis   . Asthma   . Asthma    Phreesia 04/12/2020  . Breast mass    left (monitor)  . CHF (congestive heart failure) (HCC)    Phreesia 04/12/2020  . Coronary atherosclerosis of native coronary artery    Mild nonobstructive CAD by cardiac catheterization 2008 - Dr. Levern  . Degenerative disc disease   . Depression   . Depression  Phreesia 04/12/2020  . Diabetes mellitus without complication (HCC)    Phreesia 04/12/2020  . Dysrhythmia   . Emphysema of lung (HCC)   . Essential hypertension, benign   . Folliculitis 01/21/2015  . GERD (gastroesophageal reflux disease)   . H/O hiatal hernia   . Helicobacter pylori gastritis 2008  . Hypercholesterolemia   . Hypertension   . Hyperthyroidism    s/p radiation  . Left-sided epistaxis 05/15/2018  . Low back pain   . Migraine   . Myocardial infarction (HCC)   . Nephrolithiasis     Recurring episodes since 2004  . Neuromuscular disorder (HCC)    Phreesia 04/12/2020  . Osteoporosis   . Oxygen  deficiency   . Severe obesity (BMI >= 40) (HCC)   . Sleep apnea    STOP BANG SCORE 6no cpap used  . Thyroid  disease    Phreesia 04/12/2020  . Type 2 diabetes mellitus with diabetic neuropathy (HCC)     Tobacco History: Social History   Tobacco Use  Smoking Status Never  Smokeless Tobacco Never   Counseling given: Not Answered   Outpatient Medications Prior to Visit  Medication Sig Dispense Refill  . albuterol  (VENTOLIN  HFA) 108 (90 Base) MCG/ACT inhaler Inhale 2 puffs into the lungs every 6 (six) hours as needed for wheezing or shortness of breath. 18 each 6  . amLODipine  (NORVASC ) 10 MG tablet TAKE 1 TABLET BY MOUTH EVERY DAY 90 tablet 3  . B-D UF III MINI PEN NEEDLES 31G X 5 MM MISC Use to inject insulin  twice daily 100 each 6  . betamethasone, augmented, (DIPROLENE) 0.05 % lotion Apply 1 application. topically 2 (two) times daily.    . blood glucose meter kit and supplies Dispense based on patient and insurance preference. Once daily testing dx. e11.9 1 each 0  . calcipotriene (DOVONOX) 0.005 % ointment Apply 1 application topically daily.     . clobetasol cream (TEMOVATE) 0.05 % Apply 1 application. topically 2 (two) times daily.    . cloNIDine  (CATAPRES  - DOSED IN MG/24 HR) 0.3 mg/24hr patch APPLY ONE PATCH TO SKIN ONCE WEEKLY 12 patch 3  . cloNIDine  (CATAPRES ) 0.3 MG tablet TAKE 1 TABLET (0.3 MG TOTAL) BY MOUTH AT BEDTIME. 90 tablet 1  . cyclobenzaprine  (FLEXERIL ) 10 MG tablet Take 1 tablet (10 mg total) by mouth 3 (three) times daily. 90 tablet 5  . diazepam  (VALIUM ) 5 MG tablet Take one tablet by mouth three times daily for back spasm 90 tablet 1  . DULoxetine  (CYMBALTA ) 60 MG capsule TAKE 1 CAPSULE BY MOUTH 2 TIMES DAILY. 180 capsule 2  . esomeprazole  (NEXIUM ) 40 MG capsule TAKE 1 CAPSULE BY MOUTH EVERY DAY 90 capsule 0  . Evolocumab  (REPATHA ) 140 MG/ML  SOSY Inject 140 mg into the skin every 14 (fourteen) days. 2.1 mL 11  . fenofibrate  (TRICOR ) 145 MG tablet TAKE 1 TABLET BY MOUTH EVERY DAY 90 tablet 1  . gabapentin  (NEURONTIN ) 800 MG tablet TAKE ONE TABLET BY MOUTH FOUR TIMES DAILY FOR PAIN 120 tablet 5  . glucose blood (ONETOUCH VERIO) test strip Use to test blood glucose three times daily as directed 300 strip 2  . hydrocortisone  (PROCTOSOL HC) 2.5 % rectal cream     . ipratropium-albuterol  (DUONEB) 0.5-2.5 (3) MG/3ML SOLN Take 3 mLs by nebulization every 6 (six) hours as needed. 360 mL 3  . KLOR-CON  M20 20 MEQ tablet TAKE 1 TABLET BY MOUTH TWICE A DAY 180 tablet 1  . Lancets [facility] DELICA PLUS  LANCET33G) MISC USE TO TEST THREE TIMES DAILY. DX : E11.65 100 each 3  . levothyroxine  (SYNTHROID ) 75 MCG tablet Take 1 tablet (75 mcg total) by mouth daily. 90 tablet 3  . loratadine  (CLARITIN ) 10 MG tablet TAKE 1 TABLET BY MOUTH EVERY DAY 100 tablet 3  . metFORMIN  (GLUCOPHAGE ) 500 MG tablet Take 1 tablet (500 mg total) by mouth daily with breakfast. 90 tablet 3  . metoprolol  tartrate (LOPRESSOR ) 25 MG tablet Take 1 tablet (25 mg total) by mouth 2 (two) times daily. 180 tablet 0  . naloxone  (NARCAN ) nasal spray 4 mg/0.1 mL One spray into each nostril once as needed 1 each 0  . nitroGLYCERIN  (NITROSTAT ) 0.4 MG SL tablet Place 1 tablet (0.4 mg total) under the tongue every 5 (five) minutes as needed for chest pain. 30 tablet 7  . oxyCODONE  (OXYCONTIN ) 40 mg 12 hr tablet Take 1 tablet (40 mg total) by mouth every 12 (twelve) hours. 60 tablet 0  . oxyCODONE  (OXYCONTIN ) 40 mg 12 hr tablet Take pone tablet by mouth two times daily for pain 60 tablet 0  . oxyCODONE  (OXYCONTIN ) 40 mg 12 hr tablet Take one tablet by mouth two times daily 60 tablet 0  . oxyCODONE  (OXYCONTIN ) 40 mg 12 hr tablet Take one tablet by mouth two times daily 60 tablet 0  . [START ON 01/31/2024] oxyCODONE  (OXYCONTIN ) 40 mg 12 hr tablet Take one tablet by mouth twice daily for pain 60  tablet 0  . [START ON 03/31/2024] oxyCODONE  (OXYCONTIN ) 40 mg 12 hr tablet Take one tablet by mouth two times daily for pain 60 tablet 0  . rosuvastatin  (CRESTOR ) 10 MG tablet TAKE ONE TABLET BY MOUTH EVERY MONDAY, WEDNESDAY AND FRIDAY 36 tablet 3  . spironolactone  (ALDACTONE ) 25 MG tablet TAKE 1 TABLET (25 MG TOTAL) BY MOUTH DAILY. 90 tablet 1  . SYMBICORT  160-4.5 MCG/ACT inhaler Inhale 2 puffs into the lungs in the morning and at bedtime. 1 each 12  . tirzepatide  (MOUNJARO ) 12.5 MG/0.5ML Pen Inject 12.5 mg into the skin once a week. 6 mL 3  . trimethoprim -polymyxin b  (POLYTRIM ) ophthalmic solution PLACE 1 DROP INTO BOTH EYES EVERY 4 (FOUR) HOURS FOR 7 DAYS. MAX 4 DOSES PER DAY.    [name] UNABLE TO FIND BD Ultra-Fine Mini Pen Needle 31 gauge x 3/16  USE AS DIRECTED TWICE A DAY    . UNABLE TO FIND OneTouch Verio test strips  USE AS INSTRUCTED FOUR TIMES DAILY TESTING DX E11.65    . UNABLE TO FIND OneTouch Delica Plus Lancet 33 gauge  USE TO TEST ONCE DAILY    . Vitamin D , Ergocalciferol , (DRISDOL ) 1.25 MG (50000 UNIT) CAPS capsule TAKE 1 CAPSULE (50,000 UNITS TOTAL) BY MOUTH ONCE A WEEK. ONE CAPSULE ONCE WEEKLY 12 capsule 2  . zolpidem  (AMBIEN ) 10 MG tablet Take 1 tablet (10 mg total) by mouth at bedtime. 30 tablet 5  . zolpidem  (AMBIEN ) 10 MG tablet Take 1 tablet (10 mg total) by mouth at bedtime. 30 tablet 5  . zolpidem  (AMBIEN ) 10 MG tablet Take 1 tablet (10 mg total) by mouth at bedtime. 30 tablet 5  . oxyCODONE  (OXYCONTIN ) 40 mg 12 hr tablet Take 1 tablet (40 mg total) by mouth every 12 (twelve) hours. (Patient not taking: Reported on 01/14/2024) 60 tablet 0  . [START ON 03/01/2024] oxyCODONE  (OXYCONTIN ) 40 mg 12 hr tablet Take 1 tablet (40 mg total) by mouth every 12 (twelve) hours. (Patient not taking: Reported on 01/14/2024) 60 tablet  0  . UNABLE TO FIND Med Name:  Nutritional supplement 225 units/m or PRN Incontinence pads   264/m or PRN Underpads/Chux     90/m or PRN Nonsterile gloves  1B/M  OR PRN (Patient not taking: Reported on 01/14/2024) 1 each PRN  . UNABLE TO FIND Med Name: Step stool with handle (Patient not taking: Reported on 01/14/2024) 1 each 0  . UNABLE TO FIND Med Name:  BEDSIDE STEP STOOL WITH HANDLE (Patient not taking: Reported on 01/14/2024) 1 each 0  . UNABLE TO FIND Med Name:  Under pads/CHUX                      90/M OR PRN NUTRITIONAL SUPPLEMENT  225U/M OR PRN INCONTINENCE PADS              264/M OR PRN NONSTERILE GLOVES             1B/M OR PRN WIPES                                         2PKG/M (Patient not taking: Reported on 01/14/2024) 1 each 12  . UNABLE TO FIND Med Name: LIFT CHAIR  DX: R29.898 (Patient not taking: Reported on 01/14/2024) 1 each 0  . UNABLE TO FIND Med Name: LIFT CHAIR (Patient not taking: Reported on 01/14/2024) 1 each 0  . UNABLE TO FIND Med Name: Danae Roughen DX: R26.89 (Patient not taking: Reported on 01/14/2024) 1 each 0  . furosemide  (LASIX ) 40 MG tablet Take 1 tablet (40 mg total) by mouth daily. 90 tablet 0   No facility-administered medications prior to visit.      Review of Systems  Review of Systems   Physical Exam  BP 132/64   Pulse 75   Temp (!) 97.5 F (36.4 C)   Ht 5' 4 (1.626 m)   Wt 266 lb 3.2 oz (120.7 kg)   SpO2 98% Comment: RA  BMI 45.69 kg/m  Physical Exam  ***  Lab Results:  CBC    Component Value Date/Time   WBC 5.4 06/20/2023 0943   WBC 6.7 07/03/2019 1519   RBC 4.97 06/20/2023 0943   RBC 4.90 07/03/2019 1519   HGB 12.9 06/20/2023 0943   HCT 41.1 06/20/2023 0943   PLT 556 (H) 06/20/2023 0943   MCV 83 06/20/2023 0943   MCH 26.0 (L) 06/20/2023 0943   MCH 24.9 (L) 07/03/2019 1519   MCHC 31.4 (L) 06/20/2023 0943   MCHC 31.0 07/03/2019 1519   RDW 13.2 06/20/2023 0943   LYMPHSABS 2.6 05/06/2018 0335   MONOABS 0.5 05/06/2018 0335   EOSABS 0.2 05/06/2018 0335   BASOSABS 0.0 05/06/2018 0335    BMET    Component Value Date/Time   NA 141 05/14/2023 0926   K 4.4 05/14/2023 0926   CL 101  05/14/2023 0926   CO2 23 05/14/2023 0926   GLUCOSE 114 (H) 05/14/2023 0926   GLUCOSE 316 (H) 02/11/2020 1111   BUN 25 05/14/2023 0926   CREATININE 1.07 (H) 05/14/2023 0926   CREATININE 0.91 02/11/2020 1111   CALCIUM  10.2 05/14/2023 0926   GFRNONAA 70 05/06/2020 1201   GFRNONAA 69 02/11/2020 1111   GFRAA 80 05/06/2020 1201   GFRAA 80 02/11/2020 1111    BNP No results found for: BNP  ProBNP    Component Value Date/Time   PROBNP 194 06/05/2019 1019  Imaging: No results found.   Assessment & Plan:   No problem-specific Assessment & Plan notes found for this encounter.   1. Asthma, unspecified asthma severity, unspecified whether complicated, unspecified whether persistent (Primary)  2. OSA (obstructive sleep apnea)   Assessment and Plan Assessment & Plan Obstructive sleep apnea, well controlled with CPAP Obstructive sleep apnea is well controlled with CPAP therapy. Compliance is high with 93% usage and an average of 6 hours and 13 minutes per night. The apnea-hypopnea index is 2.2, indicating well-controlled apnea. She is satisfied with the new, lighter CPAP machine and nasal mask, with no significant air leaks reported. - Continue current CPAP therapy with nasal mask - Replace CPAP supplies as needed - Consider CPAP machine replacement every five years  Asthma, stable on current regimen Asthma is well-managed on the current regimen. She reports maintaining asthma symptoms well, with no recent need for albuterol . She is using Symbicort  as prescribed, two puffs in the morning and evening. Seasonal changes may affect symptoms, but currently, there are no exacerbations. - Continue Symbicort , two puffs in the morning and evening - Ensure albuterol  is available for use as needed  Heart failure, stable on loop diuretic therapy Heart failure is well-managed on current loop diuretic therapy. She reports no issues with weight gain or fluid buildup, and her weight is  decreasing. She is on Lasix  40 mg once daily and is also taking Mildura. She has a limited supply of Lasix  and requires a refill until her next cardiology appointment. - Provide a 30-day refill of Lasix  40 mg once daily - Coordinate with cardiologist for ongoing management  Allergic rhinitis, controlled with loratadine  Allergic rhinitis is controlled with loratadine . She is taking loratadine  for allergy management and reports no issues. - Continue loratadine  for allergy management  Recording duration: 6 minutes       Almarie LELON Ferrari, NP 01/14/2024

## 2024-01-14 NOTE — Patient Instructions (Addendum)
  VISIT SUMMARY: During your follow-up visit, we discussed your obstructive sleep apnea, asthma, heart failure, and allergic rhinitis. You are doing well with your current treatments and have no significant issues to report.   YOUR PLAN: -OBSTRUCTIVE SLEEP APNEA: Obstructive sleep apnea is a condition where your breathing stops and starts during sleep. Your condition is well controlled with your CPAP machine, which you are using consistently. Continue using your CPAP machine with the nasal mask, replace supplies as needed, and consider replacing the machine every five years.  -ASTHMA: Asthma is a condition that causes your airways to narrow and swell, making it difficult to breathe. Your asthma is stable with your current medication regimen. Continue using Symbicort , two puffs in the morning and evening, and keep albuterol  available for use as needed.  -HEART FAILURE: Heart failure is a condition where your heart doesn't pump blood as well as it should. Your condition is stable with your current medication. Continue taking Lasix  40 mg once daily, and we have provided a 30-day refill. Coordinate with your cardiologist for ongoing management.  -ALLERGIC RHINITIS: Allergic rhinitis is an allergic reaction that causes sneezing, congestion, and a runny nose. Your condition is controlled with loratadine . Continue taking loratadine  for allergy management.  INSTRUCTIONS: Continue with your current treatments and medications as discussed. We have provided a 30-day refill for Lasix . Please coordinate with your cardiologist for ongoing management of your heart failure. Replace your CPAP supplies as needed and consider replacing the machine every five years.   Follow-up 6 months with [facility] NP or sooner if needed

## 2024-01-16 ENCOUNTER — Other Ambulatory Visit: Payer: Self-pay | Admitting: Family Medicine

## 2024-01-16 DIAGNOSIS — I10 Essential (primary) hypertension: Secondary | ICD-10-CM

## 2024-02-04 DIAGNOSIS — R0609 Other forms of dyspnea: Secondary | ICD-10-CM | POA: Diagnosis not present

## 2024-02-05 ENCOUNTER — Encounter: Payer: Self-pay | Admitting: Internal Medicine

## 2024-02-05 ENCOUNTER — Ambulatory Visit: Attending: Internal Medicine | Admitting: Internal Medicine

## 2024-02-05 VITALS — BP 118/74 | HR 58 | Ht 64.0 in | Wt 278.0 lb

## 2024-02-05 DIAGNOSIS — I10 Essential (primary) hypertension: Secondary | ICD-10-CM

## 2024-02-05 DIAGNOSIS — R0609 Other forms of dyspnea: Secondary | ICD-10-CM

## 2024-02-05 DIAGNOSIS — G4733 Obstructive sleep apnea (adult) (pediatric): Secondary | ICD-10-CM | POA: Diagnosis not present

## 2024-02-05 DIAGNOSIS — Z79899 Other long term (current) drug therapy: Secondary | ICD-10-CM | POA: Diagnosis not present

## 2024-02-05 DIAGNOSIS — I5032 Chronic diastolic (congestive) heart failure: Secondary | ICD-10-CM | POA: Diagnosis not present

## 2024-02-05 DIAGNOSIS — E7800 Pure hypercholesterolemia, unspecified: Secondary | ICD-10-CM

## 2024-02-05 MED ORDER — POTASSIUM CHLORIDE CRYS ER 20 MEQ PO TBCR: 20.0000 meq | EXTENDED_RELEASE_TABLET | Freq: Two times a day (BID) | ORAL | 3 refills | Status: AC

## 2024-02-05 MED ORDER — METOPROLOL TARTRATE 25 MG PO TABS: 25.0000 mg | ORAL_TABLET | Freq: Two times a day (BID) | ORAL | 3 refills | Status: AC

## 2024-02-05 MED ORDER — FUROSEMIDE 40 MG PO TABS: 40.0000 mg | ORAL_TABLET | Freq: Every day | ORAL | 3 refills | Status: AC

## 2024-02-05 NOTE — Progress Notes (Signed)
 Cardiology Office Note:  .   Date:  02/05/2024  ID:  Angela [name], DOB 03-31-1961, MRN [id number] PCP: Antonetta Rollene BRAVO, MD  [facility] Providers Cardiologist:  None    History of Present Illness: Angela [name]   Angela [name] is a 63 y.o. female.  Discussed the use of AI scribe software for clinical note transcription with the patient, who gave verbal consent to proceed.  History of Present Illness Angela [name] is a 63 year old female with HTN, HLD, diabetes who presents for a cardiovascular follow-up.  She reports improved energy levels and overall well-being. She has discontinued insulin , attributing her health improvements to dietary changes, including reducing soda intake and eating more salads, steamed foods, and baked items. She occasionally uses oxygen , particularly during recent travels to [place] .  She has gained ten pounds recently, attributed to traveling and enjoying food with family. Despite the weight gain, she is motivated to lose weight by walking more. She feels the weight gain in her knee.  Her current medications for cardiovascular health include amlodipine  10 mg daily, clonidine  0.3 mg at bedtime and as a patch, Repatha  140 mg every 14 days, fenofibrate  145 mg daily, Lasix  40 mg daily, metoprolol  25 mg twice daily, rosuvastatin  10 mg daily, and spironolactone  25 mg daily.   Her A1C has increased slightly. She continues to monitor her glucose levels every two days and is on Mounjaro , which has been increased to 12.5 mg weekly.    ROS: negative except per HPI above.  Studies Reviewed: Angela [name]   EKG Interpretation Date/Time:  Tuesday February 05 2024 11:06:30 EDT Ventricular Rate:  58 PR Interval:  208 QRS Duration:  90 QT Interval:  438 QTC Calculation: 429 R Axis:   -27  Text Interpretation: Sinus bradycardia Low voltage QRS When compared with ECG of 06-Feb-2023 08:28, Minimal criteria for Anteroseptal infarct are no longer Present Criteria for Inferior infarct  are no longer Present Confirmed by Loni Rushing ([id number]) on 02/05/2024 11:39:18 AM    Results LABS LDL: 82 (06/2023)  DIAGNOSTIC EKG: Normal (02/05/2024) Left heart catheterization: Minimal coronary artery disease Right heart catheterization: Normal, no pulmonary hypertension Echocardiogram: Mild left ventricular hypertrophy Risk Assessment/Calculations:       Physical Exam:   VS:  BP 118/74   Pulse (!) 58   Ht 5' 4 (1.626 m)   Wt 278 lb (126.1 kg)   SpO2 98%   BMI 47.72 kg/m    Wt Readings from Last 3 Encounters:  02/05/24 278 lb (126.1 kg)  01/14/24 266 lb 3.2 oz (120.7 kg)  01/11/24 265 lb 12.8 oz (120.6 kg)     Physical Exam VITALS: BP- 118/74 GENERAL: Alert, cooperative, well developed, no acute distress HEENT: Normocephalic, normal oropharynx, moist mucous membranes CHEST: Clear to auscultation bilaterally, no wheezes, rhonchi, or crackles CARDIOVASCULAR: Normal heart rate and rhythm, S1 and S2 normal without murmurs ABDOMEN: Soft, non-tender, non-distended, without organomegaly, normal bowel sounds EXTREMITIES: No cyanosis or edema NEUROLOGICAL: Cranial nerves grossly intact, moves all extremities without gross motor or sensory deficit   ASSESSMENT AND PLAN: .    Assessment and Plan Assessment & Plan Chronic diastolic heart failure Med mgmt Condition well-managed, no fluid overload or edema, improved EKG, patient reports better activity tolerance. - continue lasix  40 mg daily and potassium 20 meq BID. Refill for 1 year.   Essential hypertension Blood pressure controlled at 118/74 mmHg, adherent to antihypertensive regimen without issues. - Continue amlodipine  10 mg daily. - Continue clonidine  0.3 mg patch  and 0.3 mg pill at bedtime. - Continue metoprolol  25 mg twice daily. Refill for 1 year  Hyperlipidemia LDL at 82 mg/dL, within target.  - Continue Repatha  140 mg every 14 days. - Continue rosuvastatin  10 mg daily.  Obstructive sleep  apnea Managed with CPAP, patient reports good adherence and improved sleep quality, interested in new CPAP machine. - Continue CPAP therapy. - Consider new CPAP machine as requested.       Soyla Merck, MD, FACC

## 2024-02-05 NOTE — Patient Instructions (Signed)
 Medication Instructions:  Refilled medications *If you need a refill on your cardiac medications before your next appointment, please call your pharmacy*  Lab Work: NONE If you have labs (blood work) drawn today and your tests are completely normal, you will receive your results only by: MyChart Message (if you have MyChart) OR A paper copy in the mail If you have any lab test that is abnormal or we need to change your treatment, we will call you to review the results.  Testing/Procedures: NONE  Follow-Up: At [facility], you and your health needs are our priority.  As part of our continuing mission to provide you with exceptional heart care, our providers are all part of one team.  This team includes your primary Cardiologist (physician) and Advanced Practice Providers or APPs (Physician Assistants and Nurse Practitioners) who all work together to provide you with the care you need, when you need it.  Your next appointment:   1 year(s)  Provider:   Loni, MD  We recommend signing up for the patient portal called MyChart.  Sign up information is provided on this After Visit Summary.  MyChart is used to connect with patients for Virtual Visits (Telemedicine).  Patients are able to view lab/test results, encounter notes, upcoming appointments, etc.  Non-urgent messages can be sent to your provider as well.   To learn more about what you can do with MyChart, go to [email or web address].

## 2024-02-06 DIAGNOSIS — G4733 Obstructive sleep apnea (adult) (pediatric): Secondary | ICD-10-CM | POA: Diagnosis not present

## 2024-02-08 DIAGNOSIS — G4733 Obstructive sleep apnea (adult) (pediatric): Secondary | ICD-10-CM | POA: Diagnosis not present

## 2024-02-09 ENCOUNTER — Other Ambulatory Visit: Payer: Self-pay | Admitting: Family Medicine

## 2024-02-26 NOTE — Progress Notes (Signed)
 Angela [name]                                          MRN: [id number]   02/26/2024   The VBCI Quality Team Specialist reviewed this patient medical record for the purposes of chart review for care gap closure. The following were reviewed: abstraction for care gap closure-glycemic status assessment.    VBCI Quality Team

## 2024-03-03 ENCOUNTER — Other Ambulatory Visit: Payer: Self-pay | Admitting: Family Medicine

## 2024-03-03 DIAGNOSIS — E1165 Type 2 diabetes mellitus with hyperglycemia: Secondary | ICD-10-CM

## 2024-03-04 ENCOUNTER — Telehealth: Payer: Self-pay | Admitting: Nurse Practitioner

## 2024-03-04 ENCOUNTER — Other Ambulatory Visit: Payer: Self-pay | Admitting: *Deleted

## 2024-03-04 DIAGNOSIS — Z7985 Long-term (current) use of injectable non-insulin antidiabetic drugs: Secondary | ICD-10-CM

## 2024-03-04 DIAGNOSIS — Z794 Long term (current) use of insulin: Secondary | ICD-10-CM

## 2024-03-04 DIAGNOSIS — Z7984 Long term (current) use of oral hypoglycemic drugs: Secondary | ICD-10-CM

## 2024-03-04 DIAGNOSIS — E1159 Type 2 diabetes mellitus with other circulatory complications: Secondary | ICD-10-CM

## 2024-03-04 DIAGNOSIS — E1165 Type 2 diabetes mellitus with hyperglycemia: Secondary | ICD-10-CM

## 2024-03-04 MED ORDER — MICROLET LANCETS MISC: 5 refills | Status: AC

## 2024-03-04 MED ORDER — CONTOUR PLUS TEST VI STRP: ORAL_STRIP | 5 refills | Status: AC

## 2024-03-04 MED ORDER — CONTOUR PLUS BLUE W/DEVICE KIT: 1.0000 | PACK | Freq: Every day | 0 refills | Status: DC

## 2024-03-04 NOTE — Telephone Encounter (Signed)
 This has been done, not sure that the patient's insurance will cover .

## 2024-03-04 NOTE — Telephone Encounter (Signed)
 Patient said she needs a new meter for [employer] with supplies - [facility] [place]

## 2024-03-07 ENCOUNTER — Telehealth: Payer: Self-pay | Admitting: Family Medicine

## 2024-03-07 NOTE — Telephone Encounter (Signed)
 Copied from CRM #[id number]. Topic: Clinical - Medication Question >> Mar 07, 2024 12:33 PM Hadassah PARAS wrote: Reason for CRM: Pt is going [place] on Monday the 18th and wont be back until the Nov 28th. Pharmacy is requesting to refill medication earlier. Please advise [phone]  oxyCODONE  (OXYCONTIN ) 40 mg 12 hr tablet

## 2024-03-10 ENCOUNTER — Other Ambulatory Visit: Payer: Self-pay | Admitting: Internal Medicine

## 2024-03-10 NOTE — Telephone Encounter (Signed)
 Pharmacy currently closed, will call again later

## 2024-03-10 NOTE — Telephone Encounter (Signed)
 Pharmacy informed.

## 2024-03-11 ENCOUNTER — Ambulatory Visit (INDEPENDENT_AMBULATORY_CARE_PROVIDER_SITE_OTHER): Admitting: Family Medicine

## 2024-03-11 ENCOUNTER — Encounter: Payer: Self-pay | Admitting: Family Medicine

## 2024-03-11 VITALS — BP 121/78 | HR 66 | Resp 16 | Ht 64.0 in | Wt 275.0 lb

## 2024-03-11 DIAGNOSIS — E038 Other specified hypothyroidism: Secondary | ICD-10-CM

## 2024-03-11 DIAGNOSIS — E785 Hyperlipidemia, unspecified: Secondary | ICD-10-CM

## 2024-03-11 DIAGNOSIS — I110 Hypertensive heart disease with heart failure: Secondary | ICD-10-CM | POA: Diagnosis not present

## 2024-03-11 DIAGNOSIS — E1165 Type 2 diabetes mellitus with hyperglycemia: Secondary | ICD-10-CM

## 2024-03-11 DIAGNOSIS — F322 Major depressive disorder, single episode, severe without psychotic features: Secondary | ICD-10-CM

## 2024-03-11 DIAGNOSIS — I5032 Chronic diastolic (congestive) heart failure: Secondary | ICD-10-CM

## 2024-03-11 DIAGNOSIS — R52 Pain, unspecified: Secondary | ICD-10-CM

## 2024-03-11 DIAGNOSIS — I10 Essential (primary) hypertension: Secondary | ICD-10-CM

## 2024-03-11 DIAGNOSIS — E119 Type 2 diabetes mellitus without complications: Secondary | ICD-10-CM

## 2024-03-11 DIAGNOSIS — M62830 Muscle spasm of back: Secondary | ICD-10-CM

## 2024-03-11 MED ORDER — ZOLPIDEM TARTRATE 10 MG PO TABS: 10.0000 mg | ORAL_TABLET | Freq: Every day | ORAL | 5 refills | Status: AC

## 2024-03-11 NOTE — Patient Instructions (Addendum)
 Annual exam Feb 20 or shortly after  Urine aCr , cmp and EGFr and lipid panel today   No med changes  Best for the Season and 2026!  Thanks for choosing [facility], we consider it a privelige to serve you.

## 2024-03-12 ENCOUNTER — Ambulatory Visit: Payer: Self-pay | Admitting: Family Medicine

## 2024-03-13 ENCOUNTER — Telehealth: Payer: Self-pay

## 2024-03-13 LAB — CMP14+EGFR
ALT: 25 IU/L (ref 0–32)
AST: 43 IU/L — ABNORMAL HIGH (ref 0–40)
Albumin: 4.6 g/dL (ref 3.9–4.9)
Alkaline Phosphatase: 48 IU/L — ABNORMAL LOW (ref 49–135)
BUN/Creatinine Ratio: 20 (ref 12–28)
BUN: 24 mg/dL (ref 8–27)
Bilirubin Total: 0.4 mg/dL (ref 0.0–1.2)
CO2: 24 mmol/L (ref 20–29)
Calcium: 10.8 mg/dL — ABNORMAL HIGH (ref 8.7–10.3)
Chloride: 98 mmol/L (ref 96–106)
Creatinine, Ser: 1.2 mg/dL — ABNORMAL HIGH (ref 0.57–1.00)
Globulin, Total: 3.3 g/dL (ref 1.5–4.5)
Glucose: 147 mg/dL — ABNORMAL HIGH (ref 70–99)
Potassium: 3.7 mmol/L (ref 3.5–5.2)
Sodium: 140 mmol/L (ref 134–144)
Total Protein: 7.9 g/dL (ref 6.0–8.5)
eGFR: 51 mL/min/1.73 — ABNORMAL LOW (ref >59)

## 2024-03-13 LAB — LIPID PANEL
Chol/HDL Ratio: 3.8 ratio (ref 0.0–4.4)
Cholesterol, Total: 168 mg/dL (ref 100–199)
HDL: 44 mg/dL (ref >39)
LDL Chol Calc (NIH): 104 mg/dL — ABNORMAL HIGH (ref 0–99)
Triglycerides: 111 mg/dL (ref 0–149)
VLDL Cholesterol Cal: 20 mg/dL (ref 5–40)

## 2024-03-13 LAB — MICROALBUMIN / CREATININE URINE RATIO
Creatinine, Urine: 57.1 mg/dL
Microalb/Creat Ratio: <5 mg/g{creat} (ref 0–29)
Microalbumin, Urine: <3 ug/mL

## 2024-03-13 MED ORDER — DIAZEPAM 5 MG PO TABS: ORAL_TABLET | ORAL | 3 refills | Status: AC

## 2024-03-13 MED ORDER — OXYCODONE HCL ER 40 MG PO T12A: EXTENDED_RELEASE_TABLET | ORAL | 0 refills | Status: AC

## 2024-03-13 NOTE — Telephone Encounter (Signed)
 In spoke with the pt and medication is sent in

## 2024-03-13 NOTE — Telephone Encounter (Signed)
 Patient calling to see if Dr. Antonetta could approve early refill for the oxycodone  (OXYCONTIN ) 40MG   12 hr tablet. Patient states she is leaving tonight and will be out of town.   Patient requesting for this to be approved today and states she spoke Dr. Antonetta about this on Tuesday as well.

## 2024-03-13 NOTE — Telephone Encounter (Signed)
 Copied from CRM [id number]. Topic: Clinical - Medication Question >> Mar 13, 2024 10:55 AM Antony RAMAN wrote: Reason for CRM: [facility] to see if we can approve a early refill for oxyCODONE  (OXYCONTIN ) 40 mg 12 hr tablet , patient is going out of town for thanksgiving and needs approval from doc to fill early. Cb- [phone]

## 2024-03-18 ENCOUNTER — Encounter: Payer: Self-pay | Admitting: Podiatry

## 2024-03-18 ENCOUNTER — Ambulatory Visit: Admitting: Podiatry

## 2024-03-18 DIAGNOSIS — M79674 Pain in right toe(s): Secondary | ICD-10-CM

## 2024-03-18 DIAGNOSIS — B351 Tinea unguium: Secondary | ICD-10-CM | POA: Diagnosis not present

## 2024-03-18 DIAGNOSIS — M79675 Pain in left toe(s): Secondary | ICD-10-CM | POA: Diagnosis not present

## 2024-03-18 DIAGNOSIS — Q828 Other specified congenital malformations of skin: Secondary | ICD-10-CM | POA: Diagnosis not present

## 2024-03-18 DIAGNOSIS — E1142 Type 2 diabetes mellitus with diabetic polyneuropathy: Secondary | ICD-10-CM

## 2024-03-23 ENCOUNTER — Encounter: Payer: Self-pay | Admitting: Family Medicine

## 2024-03-23 MED ORDER — OXYCODONE HCL ER 40 MG PO T12A: EXTENDED_RELEASE_TABLET | ORAL | 0 refills | Status: AC

## 2024-03-23 NOTE — Assessment & Plan Note (Signed)
 Controlled, no change in medication

## 2024-03-23 NOTE — Assessment & Plan Note (Signed)
 Continue chronic daily use of muscle relaxant

## 2024-03-23 NOTE — Assessment & Plan Note (Signed)
  Patient re-educated about  the importance of commitment to a  minimum of 150 minutes of exercise per week as able.  The importance of healthy food choices with portion control discussed, as well as eating regularly and within a 12 hour window most days. The need to choose clean , green food 50 to 75% of the time is discussed, as well as to make water  the primary drink and set a goal of 64 ounces water  daily.       03/11/2024    1:59 PM 02/05/2024   10:59 AM 01/14/2024    8:45 AM  Weight /BMI  Weight 275 lb 278 lb 266 lb 3.2 oz  Height 5' 4 (1.626 m) 5' 4 (1.626 m) 5' 4 (1.626 m)  BMI 47.2 kg/m2 47.72 kg/m2 45.69 kg/m2

## 2024-03-23 NOTE — Assessment & Plan Note (Signed)
Stable no s/s of decompensation

## 2024-03-23 NOTE — Assessment & Plan Note (Signed)
 Hyperlipidemia:Low fat diet discussed and encouraged.   Lipid Panel  Lab Results  Component Value Date   CHOL 168 03/11/2024   HDL 44 03/11/2024   LDLCALC 104 (H) 03/11/2024   TRIG 111 03/11/2024   CHOLHDL 3.8 03/11/2024     Needs to lower fat intake

## 2024-03-23 NOTE — Assessment & Plan Note (Signed)
 Controlled, no change in medication DASH diet and commitment to daily physical activity for a minimum of 30 minutes discussed and encouraged, as a part of hypertension management. The importance of attaining a healthy weight is also discussed.     03/11/2024    1:59 PM 02/05/2024   10:59 AM 01/14/2024    8:45 AM 01/11/2024    9:06 AM 01/01/2024    3:41 PM 11/20/2023    8:08 AM 10/10/2023   11:27 AM  BP/Weight  Systolic BP 121 118 132 114 110 -- 157  Diastolic BP 78 74 64 80 72 -- 82  Wt. (Lbs) 275 278 266.2 265.8 265 250 275.4  BMI 47.2 kg/m2 47.72 kg/m2 45.69 kg/m2 51.91 kg/m2 51.75 kg/m2 42.91 kg/m2 47.27 kg/m2

## 2024-03-23 NOTE — Assessment & Plan Note (Signed)
 The patient's Controlled Substance registry is reviewed and compliance confirmed. Adequacy of  Pain control and level of function is assessed. Medication dosing is adjusted as deemed appropriate. Twelve weeks of medication is prescribed , with a follow up appointment between 11 to 12 weeks .

## 2024-03-23 NOTE — Assessment & Plan Note (Signed)
 Diabetes associated with hypertension, hyperlipidemia, obesity, arthritis, and depression  [name] is reminded of the importance of commitment to daily physical activity for 30 minutes or more, as able and the need to limit carbohydrate intake to 30 to 60 grams per meal to help with blood sugar control.   The need to take medication as prescribed, test blood sugar as directed, and to call between visits if there is a concern that blood sugar is uncontrolled is also discussed.   Angela [name] is reminded of the importance of daily foot exam, annual eye examination, and good blood sugar, blood pressure and cholesterol control.     Latest Ref Rng & Units 03/11/2024    2:36 PM 01/11/2024    9:24 AM 09/25/2023   10:02 AM 06/20/2023    9:43 AM 05/18/2023    9:41 AM  Diabetic Labs  HbA1c 4.0 - 5.6 %  7.3  6.9   7.2   Micro/Creat Ratio 0 - 29 mg/g creat <5       Chol 100 - 199 mg/dL 831    857    HDL >60 mg/dL 44    44    Calc LDL 0 - 99 mg/dL 895    82    Triglycerides 0 - 149 mg/dL 888    85    Creatinine 0.57 - 1.00 mg/dL 8.79           88/88/7974    1:59 PM 02/05/2024   10:59 AM 01/14/2024    8:45 AM 01/11/2024    9:06 AM 01/01/2024    3:41 PM 11/20/2023    8:08 AM 10/10/2023   11:27 AM  BP/Weight  Systolic BP 121 118 132 114 110 -- 157  Diastolic BP 78 74 64 80 72 -- 82  Wt. (Lbs) 275 278 266.2 265.8 265 250 275.4  BMI 47.2 kg/m2 47.72 kg/m2 45.69 kg/m2 51.91 kg/m2 51.75 kg/m2 42.91 kg/m2 47.27 kg/m2      Latest Ref Rng & Units 03/11/2024    2:00 PM 07/11/2023   12:00 AM  Foot/eye exam completion dates  Eye Exam No Retinopathy  No Retinopathy      Foot Form Completion  Done      This result is from an external source.

## 2024-03-23 NOTE — Progress Notes (Signed)
 Angela [name]     MRN: [id number]      DOB: Jul 17, 1960  Chief Complaint  Patient presents with   Hypertension    Follow up     HPI Angela [name] is here for follow up and re-evaluation of chronic medical conditions, medication management and review of any available recent lab and radiology data.  Preventive health is updated, specifically  Cancer screening and Immunization.   Questions or concerns regarding consultations or procedures which the PT has had in the interim are  addressed. The PT denies any adverse reactions to current medications since the last visit.  There are no new concerns.  There are no specific complaints   ROS Denies recent fever or chills. Denies sinus pressure, nasal congestion, ear pain or sore throat. Denies chest congestion, productive cough or wheezing. Denies chest pains, palpitations and leg swelling Denies abdominal pain, nausea, vomiting,diarrhea or constipation.   Denies dysuria, frequency, hesitancy or incontinence. Denies joint pain, swelling and limitation in mobility. Denies headaches, seizures, numbness, or tingling. Denies depression, anxiety or insomnia. Denies skin break down or rash.   PE  BP 121/78   Pulse 66   Resp 16   Ht 5' 4 (1.626 m)   Wt 275 lb (124.7 kg)   SpO2 93%   BMI 47.20 kg/m   Patient alert and oriented and in no cardiopulmonary distress.  HEENT: No facial asymmetry, EOMI,     Neck supple .  Chest: Clear to auscultation bilaterally.  CVS: S1, S2 no murmurs, no S3.Regular rate.  ABD: Soft non tender.   Ext: No edema  MS: decreased ROM spine, shoulders, hips and knees.  Skin: Intact, no ulcerations or rash noted.  Psych: Good eye contact, normal affect. Memory intact not anxious or depressed appearing.  CNS: CN 2-12 intact, power,  normal throughout.no focal deficits noted.   Assessment & Plan  Chronic diastolic heart failure (HCC) Stable no s/s of decompensation  Back muscle spasm Continue  chronic daily use of muscle relaxant  Depression, Guest, single episode, severe (HCC) Controlled, no change in medication   Essential hypertension, benign Controlled, no change in medication DASH diet and commitment to daily physical activity for a minimum of 30 minutes discussed and encouraged, as a part of hypertension management. The importance of attaining a healthy weight is also discussed.     03/11/2024    1:59 PM 02/05/2024   10:59 AM 01/14/2024    8:45 AM 01/11/2024    9:06 AM 01/01/2024    3:41 PM 11/20/2023    8:08 AM 10/10/2023   11:27 AM  BP/Weight  Systolic BP 121 118 132 114 110 -- 157  Diastolic BP 78 74 64 80 72 -- 82  Wt. (Lbs) 275 278 266.2 265.8 265 250 275.4  BMI 47.2 kg/m2 47.72 kg/m2 45.69 kg/m2 51.91 kg/m2 51.75 kg/m2 42.91 kg/m2 47.27 kg/m2       Hyperlipidemia LDL goal <100 Hyperlipidemia:Low fat diet discussed and encouraged.   Lipid Panel  Lab Results  Component Value Date   CHOL 168 03/11/2024   HDL 44 03/11/2024   LDLCALC 104 (H) 03/11/2024   TRIG 111 03/11/2024   CHOLHDL 3.8 03/11/2024     Needs to lower fat intake  Hypothyroidism Controlled, no change in medication   Morbid obesity (HCC)  Patient re-educated about  the importance of commitment to a  minimum of 150 minutes of exercise per week as able.  The importance of healthy food choices with portion control discussed,  as well as eating regularly and within a 12 hour window most days. The need to choose clean , green food 50 to 75% of the time is discussed, as well as to make water  the primary drink and set a goal of 64 ounces water  daily.       03/11/2024    1:59 PM 02/05/2024   10:59 AM 01/14/2024    8:45 AM  Weight /BMI  Weight 275 lb 278 lb 266 lb 3.2 oz  Height 5' 4 (1.626 m) 5' 4 (1.626 m) 5' 4 (1.626 m)  BMI 47.2 kg/m2 47.72 kg/m2 45.69 kg/m2      Uncontrolled type 2 diabetes mellitus with hyperglycemia (HCC) Diabetes associated with hypertension,  hyperlipidemia, obesity, arthritis, and depression  Angela [name] is reminded of the importance of commitment to daily physical activity for 30 minutes or more, as able and the need to limit carbohydrate intake to 30 to 60 grams per meal to help with blood sugar control.   The need to take medication as prescribed, test blood sugar as directed, and to call between visits if there is a concern that blood sugar is uncontrolled is also discussed.   Angela [name] is reminded of the importance of daily foot exam, annual eye examination, and good blood sugar, blood pressure and cholesterol control.     Latest Ref Rng & Units 03/11/2024    2:36 PM 01/11/2024    9:24 AM 09/25/2023   10:02 AM 06/20/2023    9:43 AM 05/18/2023    9:41 AM  Diabetic Labs  HbA1c 4.0 - 5.6 %  7.3  6.9   7.2   Micro/Creat Ratio 0 - 29 mg/g creat <5       Chol 100 - 199 mg/dL 831    857    HDL >60 mg/dL 44    44    Calc LDL 0 - 99 mg/dL 895    82    Triglycerides 0 - 149 mg/dL 888    85    Creatinine 0.57 - 1.00 mg/dL 8.79           88/88/7974    1:59 PM 02/05/2024   10:59 AM 01/14/2024    8:45 AM 01/11/2024    9:06 AM 01/01/2024    3:41 PM 11/20/2023    8:08 AM 10/10/2023   11:27 AM  BP/Weight  Systolic BP 121 118 132 114 110 -- 157  Diastolic BP 78 74 64 80 72 -- 82  Wt. (Lbs) 275 278 266.2 265.8 265 250 275.4  BMI 47.2 kg/m2 47.72 kg/m2 45.69 kg/m2 51.91 kg/m2 51.75 kg/m2 42.91 kg/m2 47.27 kg/m2      Latest Ref Rng & Units 03/11/2024    2:00 PM 07/11/2023   12:00 AM  Foot/eye exam completion dates  Eye Exam No Retinopathy  No Retinopathy      Foot Form Completion  Done      This result is from an external source.        Encounter for pain management The patient's Controlled Substance registry is reviewed and compliance confirmed. Adequacy of  Pain control and level of function is assessed. Medication dosing is adjusted as deemed appropriate. Twelve weeks of medication is prescribed , with a follow up  appointment between 11 to 12 weeks .

## 2024-03-24 NOTE — Progress Notes (Signed)
 Subjective:  [name], female    DOB: 07-Jul-1960,  MRN: [id number]  Angela [name] presents to clinic today for at risk foot care with history of diabetic neuropathy and painful porokeratotic lesion(s) left foot and painful mycotic toenails that limit ambulation. Painful toenails interfere with ambulation. Aggravating factors include wearing enclosed shoe gear. Pain is relieved with periodic professional debridement. Painful porokeratotic lesions are aggravated when weightbearing with and without shoegear. Pain is relieved with periodic professional debridement.  Chief Complaint  Patient presents with   Diabetes    A1c 6.7 She saw Dr. Antonetta in Sept.   New problem(s): None.   PCP is Angela Rollene BRAVO, MD.  Allergies  Allergen Reactions   Latex Itching and Rash   Morphine  Other (See Comments)    Reaction with esophagus, unable to swallow.    Morphine  And Codeine Shortness Of Breath and Swelling   Other Itching, Shortness Of Breath and Swelling   Ace Inhibitors Cough   Aspirin Other (See Comments)    Reaction with esophagus, unable to swallow.    Losartan Cough   Statins Other (See Comments)    Elevation of liver enzymes   Tapentadol  Other (See Comments)    Nausea, increased sleepiness, h/a   Adhesive [Tape] Rash   Tomato Other (See Comments)    Acid reflux due to acid in tomato    Review of Systems: Negative except as noted in the HPI.  Objective:  There were no vitals filed for this visit. Angela [name] is a pleasant 63 y.o. female morbidly obese in NAD. AAO x 3.  Vascular Examination: Palpable pedal pulses. CFT immediate b/l. Pedal hair present. No edema. No pain with calf compression b/l. Skin temperature gradient WNL b/l. No varicosities noted. No cyanosis or clubbing noted.  Neurological Examination: Pt has subjective symptoms of neuropathy. Protective sensation decreased with 10 gram monofilament b/l.   Dermatological Examination: Pedal skin  with normal turgor, texture and tone b/l. No open wounds nor interdigital macerations noted. Toenails 1-5 b/l thick, discolored, elongated with subungual debris and pain on dorsal palpation. Hyperkeratos(is/es) noted submet head 5 left foot. No erythema, no edema, no drainage, no fluctuance.  Musculoskeletal Examination: Muscle strength 5/5 to b/l LE.  No pain, crepitus noted b/l. No gross pedal deformities. Patient ambulates independently without assistive aids.   Radiographs: None  Assessment/Plan: 1. Pain due to onychomycosis of toenails of both feet   2. Porokeratosis   3. Diabetic peripheral neuropathy associated with type 2 diabetes mellitus (HCC)   Patient was evaluated and treated. All patient's and/or POA's questions/concerns addressed on today's visit. Toenails 1-5 b/l debrided in length and girth without incident. Porokeratotic lesion(s) submet head 5 left foot pared with sharp debridement without incident. Continue daily foot inspections and monitor blood glucose per PCP/Endocrinologist's recommendations. Continue soft, supportive shoe gear daily. Report any pedal injuries to medical professional. Call office if there are any questions/concerns. Return in about 3 months (around 06/18/2024).  Delon LITTIE Merlin, DPM      [place] LOCATION: [address] [place] [address]                   Office [phone]   [place] LOCATION:  [address] [place], [place] [address] Office [phone]

## 2024-04-19 ENCOUNTER — Other Ambulatory Visit: Payer: Self-pay | Admitting: Family Medicine

## 2024-04-25 ENCOUNTER — Other Ambulatory Visit: Payer: Self-pay | Admitting: Nurse Practitioner

## 2024-04-25 DIAGNOSIS — Z794 Long term (current) use of insulin: Secondary | ICD-10-CM

## 2024-04-25 DIAGNOSIS — Z7985 Long-term (current) use of injectable non-insulin antidiabetic drugs: Secondary | ICD-10-CM

## 2024-04-25 DIAGNOSIS — E1159 Type 2 diabetes mellitus with other circulatory complications: Secondary | ICD-10-CM

## 2024-04-25 DIAGNOSIS — E1165 Type 2 diabetes mellitus with hyperglycemia: Secondary | ICD-10-CM

## 2024-04-25 DIAGNOSIS — Z7984 Long term (current) use of oral hypoglycemic drugs: Secondary | ICD-10-CM

## 2024-05-09 ENCOUNTER — Ambulatory Visit: Admitting: Family Medicine

## 2024-05-12 ENCOUNTER — Other Ambulatory Visit: Payer: Self-pay | Admitting: Internal Medicine

## 2024-05-12 ENCOUNTER — Telehealth: Payer: Self-pay

## 2024-05-12 MED ORDER — VITAMIN D (ERGOCALCIFEROL) 1.25 MG (50000 UNIT) PO CAPS: 50000.0000 [IU] | ORAL_CAPSULE | ORAL | 1 refills | Status: AC

## 2024-05-12 NOTE — Telephone Encounter (Signed)
 Pt needing refill of vit d. Last level was 06/2023

## 2024-05-12 NOTE — Telephone Encounter (Signed)
 10 caps sent

## 2024-05-12 NOTE — Addendum Note (Signed)
 Addended by: ANTONETTA ROLLENE BRAVO on: 05/12/2024 10:41 AM   Modules accepted: Orders

## 2024-05-13 ENCOUNTER — Encounter: Payer: Self-pay | Admitting: Nurse Practitioner

## 2024-05-13 ENCOUNTER — Ambulatory Visit: Admitting: Nurse Practitioner

## 2024-05-13 VITALS — BP 118/80 | HR 58 | Ht 64.0 in | Wt 281.0 lb

## 2024-05-13 DIAGNOSIS — E039 Hypothyroidism, unspecified: Secondary | ICD-10-CM | POA: Diagnosis not present

## 2024-05-13 DIAGNOSIS — Z7984 Long term (current) use of oral hypoglycemic drugs: Secondary | ICD-10-CM

## 2024-05-13 DIAGNOSIS — E782 Mixed hyperlipidemia: Secondary | ICD-10-CM

## 2024-05-13 DIAGNOSIS — E559 Vitamin D deficiency, unspecified: Secondary | ICD-10-CM | POA: Diagnosis not present

## 2024-05-13 DIAGNOSIS — I10 Essential (primary) hypertension: Secondary | ICD-10-CM | POA: Diagnosis not present

## 2024-05-13 DIAGNOSIS — E1159 Type 2 diabetes mellitus with other circulatory complications: Secondary | ICD-10-CM

## 2024-05-13 DIAGNOSIS — Z794 Long term (current) use of insulin: Secondary | ICD-10-CM

## 2024-05-13 DIAGNOSIS — Z7985 Long-term (current) use of injectable non-insulin antidiabetic drugs: Secondary | ICD-10-CM

## 2024-05-13 LAB — POCT GLYCOSYLATED HEMOGLOBIN (HGB A1C): Hemoglobin A1C: 9.1 % — AB (ref 4.0–5.6)

## 2024-05-13 MED ORDER — TIRZEPATIDE 12.5 MG/0.5ML ~~LOC~~ SOAJ: 12.5000 mg | SUBCUTANEOUS | 3 refills | Status: DC

## 2024-05-13 MED ORDER — TIRZEPATIDE 12.5 MG/0.5ML ~~LOC~~ SOAJ: 12.5000 mg | SUBCUTANEOUS | 3 refills | Status: AC

## 2024-05-13 MED ORDER — LEVOTHYROXINE SODIUM 75 MCG PO TABS: 75.0000 ug | ORAL_TABLET | Freq: Every day | ORAL | 3 refills | Status: AC

## 2024-05-13 MED ORDER — METFORMIN HCL 500 MG PO TABS: 500.0000 mg | ORAL_TABLET | Freq: Every day | ORAL | 3 refills | Status: AC

## 2024-05-13 NOTE — Patient Instructions (Signed)

## 2024-05-13 NOTE — Progress Notes (Signed)
 "                         05/13/2024, 10:46 AM                    Endocrinology follow-up note   Subjective:    [name], female    DOB: 29-Jul-1960.  Angela [name] is being seen in follow-up in the management of currently uncontrolled symptomatic type 2 diabetes.  She also has comorbid dyslipidemia, hypertension.   PMD:  Angela Rollene BRAVO, MD.   Past Medical History:  Diagnosis Date   Allergy    Phreesia 06/02/2020   Anxiety    Arthritis    Asthma    Asthma    Phreesia 04/12/2020   Breast mass    left (monitor)   CHF (congestive heart failure) (HCC)    Phreesia 04/12/2020   Coronary atherosclerosis of native coronary artery    Mild nonobstructive CAD by cardiac catheterization 2008 - Dr. Levern   Degenerative disc disease    Depression    Depression    Phreesia 04/12/2020   Diabetes mellitus without complication (HCC)    Phreesia 04/12/2020   Dysrhythmia    Emphysema of lung (HCC)    Essential hypertension, benign    Folliculitis 01/21/2015   GERD (gastroesophageal reflux disease)    H/O hiatal hernia    Helicobacter pylori gastritis 2008   Hypercholesterolemia    Hypertension    Hyperthyroidism    s/p radiation   Left-sided epistaxis 05/15/2018   Low back pain    Migraine    Myocardial infarction (HCC)    Nephrolithiasis    Recurring episodes since 2004   Neuromuscular disorder (HCC)    Phreesia 04/12/2020   Osteoporosis    Oxygen  deficiency    Severe obesity (BMI >= 40) (HCC)    Sleep apnea    STOP BANG SCORE 6no cpap used   Thyroid  disease    Phreesia 04/12/2020   Type 2 diabetes mellitus with diabetic neuropathy (HCC)     Past Surgical History:  Procedure Laterality Date   ABDOMINAL HYSTERECTOMY     BACK SURGERY  1993   BACK SURGERY  1994   BACK SURGERY  2002   BACK SURGERY  2011   [facility]    CARPAL TUNNEL RELEASE Left    CHOLECYSTECTOMY  1996   COLONOSCOPY  2000 [facility]   INT HEMORRHOIDS/FISSURE   COLONOSCOPY  2003  [facility], CHANGE IN BOWEL HABITS   INT HEMORRHOIDS   COLONOSCOPY  2006 [facility]   INT HEMORRHOIDS   COLONOSCOPY  2007 [facility]   INT HEMORRHOIDS   CYSTOSCOPY WITH STENT PLACEMENT Left 07/25/2012   Procedure: CYSTOSCOPY, left retograde pyelogram WITH left ureteral  STENT PLACEMENT;  Surgeon: Oneil JAYSON Rafter, MD;  Location: [facility];  Service: Urology;  Laterality: Left;   CYSTOSCOPY/RETROGRADE/URETEROSCOPY  12/04/2011   Procedure: CYSTOSCOPY/RETROGRADE/URETEROSCOPY;  Surgeon: Toribio Neysa Repine, MD;  Location: [facility];  Service: Urology;  Laterality: Right;   CYSTOSCOPY/RETROGRADE/URETEROSCOPY/STONE EXTRACTION WITH BASKET Left 09/03/2012   Procedure: CYSTOSCOPY/RETROGRADE pyelogram/digital URETEROSCOPY/STONE EXTRACTION WITH BASKET, left stent removal;  Surgeon: Toribio Neysa Repine, MD;  Location: [facility];  Service: Urology;  Laterality: Left;   ESOPHAGOGASTRODUODENOSCOPY  2008   DOQ:Dfjoo hiatal hernia./Normal esophagus without evidence of Barrett's mass, stricture, erosion or ulceration./Normal duodenal bulb and second portion of the duodenum./Diffuse erythema in the body and the antrum with occasional erosion.  Biopsies obtained via cold forceps to  evaluate for H. pylori gastritis   FRACTURE SURGERY  1999   right clavicle   HERNIA REPAIR     HOLMIUM LASER APPLICATION Left 09/03/2012   Procedure: HOLMIUM LASER APPLICATION;  Surgeon: Toribio Neysa Repine, MD;  Location: [facility];  Service: Urology;  Laterality: Left;   KNEE ARTHROSCOPY  10/04/2010   right knee arthroscopy, dr gerome   LEFT HEART CATH AND CORONARY ANGIOGRAPHY N/A 05/08/2018   Procedure: LEFT HEART CATH AND CORONARY ANGIOGRAPHY;  Surgeon: Burnard Debby LABOR, MD;  Location: [facility]AS[facility];  Service: Cardiovascular;  Laterality: N/A;   left knee arthroscopic surgery  1999   Left salphingectomy secondary to ectopic pregnancy  1991   PARTIAL HYSTERECTOMY  1991   RIGHT HEART CATH N/A 07/07/2019   Procedure: RIGHT HEART CATH;   Surgeon: Anner Alm ORN, MD;  Location: [facility] INVASIVE CV LAB;  Service: Cardiovascular;  Laterality: N/A;   rotary cuff  Right 05/14/2014   [facility]   SPINE SURGERY N/A    Phreesia 06/02/2020   UPPER GASTROINTESTINAL ENDOSCOPY  2008 abd pain   H. pylori gastritis   UPPER GASTROINTESTINAL ENDOSCOPY  1996 [facility] [place]   [facility]    Social History   Socioeconomic History   Marital status: Single    Spouse name: Not on file   Number of children: 1   Years of education: 12   Highest education level: Some college, no degree  Occupational History   Occupation: Disable   Tobacco Use   Smoking status: Never   Smokeless tobacco: Never  Vaping Use   Vaping status: Never Used  Substance and Sexual Activity   Alcohol use: No   Drug use: No   Sexual activity: Not Currently    Partners: Male    Birth control/protection: Surgical, None    Comment: hyst  Other Topics Concern   Not on file  Social History Narrative   Lives alone    Social Drivers of Health   Tobacco Use: Low Risk (05/13/2024)   Patient History    Smoking Tobacco Use: Never    Smokeless Tobacco Use: Never    Passive Exposure: Not on file  Financial Resource Strain: Low Risk (03/10/2024)   Overall Financial Resource Strain (CARDIA)    Difficulty of Paying Living Expenses: Not hard at all  Food Insecurity: No Food Insecurity (03/10/2024)   Epic    Worried About Programme Researcher, Broadcasting/film/video in the Last Year: Never true    Ran Out of Food in the Last Year: Never true  Transportation Needs: No Transportation Needs (03/10/2024)   Epic    Lack of Transportation (Medical): No    Lack of Transportation (Non-Medical): No  Physical Activity: Sufficiently Active (03/10/2024)   Exercise Vital Sign    Days of Exercise per Week: 7 days    Minutes of Exercise per Session: 30 min  Stress: No Stress Concern Present (03/10/2024)   [employer] of Occupational Health - Occupational Stress Questionnaire    Feeling of Stress: Only a little   Social Connections: Socially Isolated (03/10/2024)   Social Connection and Isolation Panel    Frequency of Communication with Friends and Family: More than three times a week    Frequency of Social Gatherings with Friends and Family: Once a week    Attends Religious Services: Never    Database Administrator or Organizations: No    Attends Engineer, Structural: Not on file    Marital Status: Divorced  Depression (PHQ2-9):  Low Risk (03/11/2024)   Depression (PHQ2-9)    PHQ-2 Score: 0  Alcohol Screen: Low Risk (11/19/2023)   Alcohol Screen    Last Alcohol Screening Score (AUDIT): 0  Housing: Unknown (03/10/2024)   Epic    Unable to Pay for Housing in the Last Year: No    Number of Times Moved in the Last Year: Not on file    Homeless in the Last Year: No  Utilities: Not At Risk (11/20/2023)   Epic    Threatened with loss of utilities: No  Health Literacy: Adequate Health Literacy (11/20/2023)   B1300 Health Literacy    Frequency of need for help with medical instructions: Never    Family History  Problem Relation Age of Onset   Heart disease Mother    Hypertension Mother    Cancer Mother        Cervical    Asthma Mother    Heart attack Father    Cancer Father        Prostate   Colon cancer Father        DECEASED AGE 70   Cancer Sister    Colon polyps Neg Hx     Outpatient Encounter Medications as of 05/13/2024  Medication Sig   albuterol  (VENTOLIN  HFA) 108 (90 Base) MCG/ACT inhaler Inhale 2 puffs into the lungs every 6 (six) hours as needed for wheezing or shortness of breath.   amLODipine  (NORVASC ) 10 MG tablet TAKE 1 TABLET BY MOUTH EVERY DAY   B-D UF III MINI PEN NEEDLES 31G X 5 MM MISC Use to inject insulin  twice daily   betamethasone, augmented, (DIPROLENE) 0.05 % lotion Apply 1 application. topically 2 (two) times daily.   blood glucose meter kit and supplies Dispense based on patient and insurance preference. Once daily testing dx. e11.9   Blood Glucose  Monitoring Suppl (ONETOUCH VERIO FLEX SYSTEM) w/Device KIT USE TO MONITOR BLOOD SUGAR ONCE DAILY AS DIRECTED BY THE PROVIDER   calcipotriene (DOVONOX) 0.005 % ointment Apply 1 application topically daily.    clobetasol cream (TEMOVATE) 0.05 % Apply 1 application. topically 2 (two) times daily.   cloNIDine  (CATAPRES  - DOSED IN MG/24 HR) 0.3 mg/24hr patch APPLY ONE PATCH TO SKIN ONCE WEEKLY   cloNIDine  (CATAPRES ) 0.3 MG tablet TAKE 1 TABLET (0.3 MG TOTAL) BY MOUTH AT BEDTIME.   cyclobenzaprine  (FLEXERIL ) 10 MG tablet Take 1 tablet (10 mg total) by mouth 3 (three) times daily.   diazepam  (VALIUM ) 5 MG tablet Take one tablet by mouth three times daily for back spasm   diazepam  (VALIUM ) 5 MG tablet Take one tablet by mouth three times daily for back spasm   DULoxetine  (CYMBALTA ) 60 MG capsule TAKE 1 CAPSULE BY MOUTH 2 TIMES DAILY.   esomeprazole  (NEXIUM ) 40 MG capsule TAKE 1 CAPSULE BY MOUTH EVERY DAY   Evolocumab  (REPATHA ) 140 MG/ML SOSY Inject 140 mg into the skin every 14 (fourteen) days.   fenofibrate  (TRICOR ) 145 MG tablet TAKE 1 TABLET BY MOUTH EVERY DAY   furosemide  (LASIX ) 40 MG tablet Take 1 tablet (40 mg total) by mouth daily.   gabapentin  (NEURONTIN ) 800 MG tablet TAKE ONE TABLET BY MOUTH FOUR TIMES DAILY FOR PAIN   glucose blood (CONTOUR PLUS TEST) test strip Patient is to use 1 test strip to monitor blood sugar daily as directed by provider.   glucose blood (ONETOUCH VERIO) test strip Use to test blood glucose three times daily as directed   hydrocortisone  (PROCTOSOL HC) 2.5 % rectal cream  ipratropium-albuterol  (DUONEB) 0.5-2.5 (3) MG/3ML SOLN Take 3 mLs by nebulization every 6 (six) hours as needed.   Lancets (ONETOUCH DELICA PLUS LANCET33G) MISC USE TO TEST THREE TIMES DAILY. DX : E11.65   loratadine  (CLARITIN ) 10 MG tablet TAKE 1 TABLET BY MOUTH EVERY DAY   metoprolol  tartrate (LOPRESSOR ) 25 MG tablet Take 1 tablet (25 mg total) by mouth 2 (two) times daily.   Microlet Lancets MISC  Patient is to use 1 lancet daily to monitor blood sugar daily as directed by provider   naloxone  (NARCAN ) nasal spray 4 mg/0.1 mL One spray into each nostril once as needed   nitroGLYCERIN  (NITROSTAT ) 0.4 MG SL tablet Place 1 tablet (0.4 mg total) under the tongue every 5 (five) minutes as needed for chest pain.   oxyCODONE  (OXYCONTIN ) 40 mg 12 hr tablet Take 1 tablet (40 mg total) by mouth every 12 (twelve) hours.   oxyCODONE  (OXYCONTIN ) 40 mg 12 hr tablet Take pone tablet by mouth two times daily for pain   oxyCODONE  (OXYCONTIN ) 40 mg 12 hr tablet Take one tablet by mouth two times daily   oxyCODONE  (OXYCONTIN ) 40 mg 12 hr tablet Take one tablet by mouth two times daily for pain   oxyCODONE  (OXYCONTIN ) 40 mg 12 hr tablet Take one tablet by mouth two times daily   oxyCODONE  (OXYCONTIN ) 40 mg 12 hr tablet Take one tablet by mouht two times daily for chronic pain   [START ON 05/30/2024] oxyCODONE  (OXYCONTIN ) 40 mg 12 hr tablet Take one tablet by mouth two times daily for chronic pain   potassium chloride  SA (KLOR-CON  M20) 20 MEQ tablet Take 1 tablet (20 mEq total) by mouth 2 (two) times daily.   rosuvastatin  (CRESTOR ) 10 MG tablet TAKE ONE TABLET BY MOUTH EVERY MONDAY, WEDNESDAY AND FRIDAY   spironolactone  (ALDACTONE ) 25 MG tablet TAKE 1 TABLET (25 MG TOTAL) BY MOUTH DAILY.   SYMBICORT  160-4.5 MCG/ACT inhaler Inhale 2 puffs into the lungs in the morning and at bedtime.   trimethoprim -polymyxin b  (POLYTRIM ) ophthalmic solution PLACE 1 DROP INTO BOTH EYES EVERY 4 (FOUR) HOURS FOR 7 DAYS. MAX 4 DOSES PER DAY.   UNABLE TO FIND BD Ultra-Fine Mini Pen Needle 31 gauge x 3/16  USE AS DIRECTED TWICE A DAY   UNABLE TO FIND OneTouch Verio test strips  USE AS INSTRUCTED FOUR TIMES DAILY TESTING DX E11.65   UNABLE TO FIND OneTouch Delica Plus Lancet 33 gauge  USE TO TEST ONCE DAILY   UNABLE TO FIND Med Name:  Nutritional supplement 225 units/m or PRN Incontinence pads   264/m or PRN Underpads/Chux     90/m  or PRN Nonsterile gloves  1B/M OR PRN   UNABLE TO FIND Med Name: Step stool with handle   UNABLE TO FIND Med Name:  BEDSIDE STEP STOOL WITH HANDLE   UNABLE TO FIND Med Name:  Under pads/CHUX                      90/M OR PRN NUTRITIONAL SUPPLEMENT  225U/M OR PRN INCONTINENCE PADS              264/M OR PRN NONSTERILE GLOVES             1B/M OR PRN WIPES  2PKG/M   UNABLE TO FIND Med Name: LIFT CHAIR  DX: R29.898   UNABLE TO FIND Med Name: LIFT CHAIR   UNABLE TO FIND Med Name: Danae Roughen DX: R26.89   Vitamin D , Ergocalciferol , (DRISDOL ) 1.25 MG (50000 UNIT) CAPS capsule Take 1 capsule (50,000 Units total) by mouth every 7 (seven) days.   zolpidem  (AMBIEN ) 10 MG tablet Take 1 tablet (10 mg total) by mouth at bedtime.   zolpidem  (AMBIEN ) 10 MG tablet Take 1 tablet (10 mg total) by mouth at bedtime.   zolpidem  (AMBIEN ) 10 MG tablet Take 1 tablet (10 mg total) by mouth at bedtime.   [DISCONTINUED] levothyroxine  (SYNTHROID ) 75 MCG tablet Take 1 tablet (75 mcg total) by mouth daily.   [DISCONTINUED] metFORMIN  (GLUCOPHAGE ) 500 MG tablet Take 1 tablet (500 mg total) by mouth daily with breakfast.   [DISCONTINUED] tirzepatide  (MOUNJARO ) 12.5 MG/0.5ML Pen Inject 12.5 mg into the skin once a week.   levothyroxine  (SYNTHROID ) 75 MCG tablet Take 1 tablet (75 mcg total) by mouth daily.   metFORMIN  (GLUCOPHAGE ) 500 MG tablet Take 1 tablet (500 mg total) by mouth daily with breakfast.   tirzepatide  (MOUNJARO ) 12.5 MG/0.5ML Pen Inject 12.5 mg into the skin once a week.   [DISCONTINUED] ezetimibe  (ZETIA ) 10 MG tablet Take 1 tablet (10 mg total) by mouth daily.   [DISCONTINUED] tirzepatide  (MOUNJARO ) 12.5 MG/0.5ML Pen Inject 12.5 mg into the skin once a week.   No facility-administered encounter medications on file as of 05/13/2024.    ALLERGIES: Allergies  Allergen Reactions   Latex Itching and Rash   Morphine  Other (See Comments)    Reaction with esophagus, unable  to swallow.    Morphine  And Codeine Shortness Of Breath and Swelling   Other Itching, Shortness Of Breath and Swelling   Ace Inhibitors Cough   Aspirin Other (See Comments)    Reaction with esophagus, unable to swallow.    Losartan Cough   Statins Other (See Comments)    Elevation of liver enzymes   Tapentadol  Other (See Comments)    Nausea, increased sleepiness, h/a   Adhesive [Tape] Rash   Tomato Other (See Comments)    Acid reflux due to acid in tomato    VACCINATION STATUS: Immunization History  Administered Date(s) Administered   Influenza Split 03/30/2011   Influenza Whole 03/21/2005, 02/09/2006   Influenza, Seasonal, Injecte, Preservative Fre 03/07/2023, 01/01/2024   Influenza,inj,Quad PF,6+ Mos 02/11/2014, 07/27/2015, 03/09/2016, 01/29/2017, 02/07/2018, 01/30/2019, 02/11/2020, 03/03/2021, 12/29/2021   Influenza-Unspecified 01/29/2017   PFIZER(Purple Top)SARS-COV-2 Vaccination 07/22/2019, 08/12/2019, 03/05/2020, 11/19/2020   Pneumococcal Polysaccharide-23 04/17/2006, 09/14/2015   Td 04/17/2006   Tdap 12/19/2022   Zoster Recombinant(Shingrix) 09/22/2022, 10/04/2022, 03/03/2024    Diabetes She presents for her follow-up diabetic visit. She has type 2 diabetes mellitus. Her disease course has been improving. There are no hypoglycemic associated symptoms. Pertinent negatives for hypoglycemia include no confusion, pallor or seizures. Associated symptoms include fatigue. Pertinent negatives for diabetes include no polydipsia, no polyphagia, no polyuria and no weight loss. There are no hypoglycemic complications. Symptoms are improving. Diabetic complications include heart disease, nephropathy and peripheral neuropathy. Risk factors for coronary artery disease include dyslipidemia, diabetes mellitus, family history, hypertension, obesity, sedentary lifestyle, tobacco exposure and post-menopausal. Current diabetic treatment includes oral agent (monotherapy) (and mounjaro ). She is  compliant with treatment most of the time. Her weight is fluctuating minimally. She is following a generally healthy diet. When asked about meal planning, she reported none. She has had a previous visit with a dietitian.  She never participates in exercise. Her home blood glucose trend is fluctuating minimally. Her breakfast blood glucose range is generally 140-180 mg/dl. (She presents today with her logs, and meter showing fluctuating glycemic profile. She notes she has not been able to ger her meds from the pharmacy in a timely manner which has affected her consistency with taking it.  She has also had several colds since last visit with higher glucose during those times.  Her POCT A1c today is 8.3%, increasing from last visit of 6.9%.  ) An ACE inhibitor/angiotensin II receptor blocker is contraindicated. She does not see a podiatrist.Eye exam is not current.    Review of systems  Constitutional: +stable body weight,  current Body mass index is 48.23 kg/m. , + fatigue, no subjective hyperthermia, no subjective hypothermia Eyes: no blurry vision, no xerophthalmia ENT: no sore throat, no nodules palpated in throat, no dysphagia/odynophagia, no hoarseness Cardiovascular: no chest pain, no shortness of breath, no palpitations, no leg swelling Respiratory: no cough, no SOB Gastrointestinal: no nausea/vomiting/diarrhea Musculoskeletal: no muscle/joint aches, walks with cane d/t hx of falls  Skin: no rashes, no hyperemia Neurological: no tremors, no numbness, no tingling, no dizziness Psychiatric: no depression, no anxiety,    Objective:    BP 118/80 (BP Location: Left Arm, Patient Position: Sitting, Cuff Size: Large)   Pulse (!) 58   Ht 5' 4 (1.626 m)   Wt 281 lb (127.5 kg)   BMI 48.23 kg/m   Wt Readings from Last 3 Encounters:  05/13/24 281 lb (127.5 kg)  03/11/24 275 lb (124.7 kg)  02/05/24 278 lb (126.1 kg)    BP Readings from Last 3 Encounters:  05/13/24 118/80  03/11/24 121/78   02/05/24 118/74     Physical Exam- Limited  Constitutional:  Body mass index is 48.23 kg/m. , not in acute distress, normal state of mind Eyes:  EOMI, no exophthalmos Musculoskeletal: no gross deformities, strength intact in all four extremities, no gross restriction of joint movements, walks with cane due to disequilibrium and hx of falls- but has not needed it recently Skin:  no rashes, no hyperemia Neurological: no tremor with outstretched hands   CMP ( most recent) CMP     Component Value Date/Time   NA 140 03/11/2024 1436   K 3.7 03/11/2024 1436   CL 98 03/11/2024 1436   CO2 24 03/11/2024 1436   GLUCOSE 147 (H) 03/11/2024 1436   GLUCOSE 316 (H) 02/11/2020 1111   BUN 24 03/11/2024 1436   CREATININE 1.20 (H) 03/11/2024 1436   CREATININE 0.91 02/11/2020 1111   CALCIUM  10.8 (H) 03/11/2024 1436   PROT 7.9 03/11/2024 1436   ALBUMIN 4.6 03/11/2024 1436   AST 43 (H) 03/11/2024 1436   ALT 25 03/11/2024 1436   ALKPHOS 48 (L) 03/11/2024 1436   BILITOT 0.4 03/11/2024 1436   GFRNONAA 70 05/06/2020 1201   GFRNONAA 69 02/11/2020 1111   GFRAA 80 05/06/2020 1201   GFRAA 80 02/11/2020 1111    Diabetic Labs (most recent): Lab Results  Component Value Date   HGBA1C 9.1 (A) 05/13/2024   HGBA1C 7.3 (A) 01/11/2024   HGBA1C 6.9 (A) 09/25/2023   MICROALBUR 101.9 (H) 11/10/2019   MICROALBUR 2.9 10/08/2017   MICROALBUR 33.7 (H) 07/06/2017     Lipid Panel ( most recent) Lipid Panel     Component Value Date/Time   CHOL 168 03/11/2024 1436   TRIG 111 03/11/2024 1436   HDL 44 03/11/2024 1436   CHOLHDL 3.8 03/11/2024 1436  CHOLHDL 4.0 11/10/2019 1303   VLDL 16 05/06/2018 0335   LDLCALC 104 (H) 03/11/2024 1436   LDLCALC 97 11/10/2019 1303      Assessment & Plan:   1) Controlled type 2 diabetes mellitus with CKD stage 3a (HCC)  - Cait Dart has currently uncontrolled symptomatic type 2 DM since  64 years of age.  She presents today with her logs, and meter showing  fluctuating glycemic profile. She notes she has not been able to ger her meds from the pharmacy in a timely manner which has affected her consistency with taking it.  She has also had several colds since last visit with higher glucose during those times.  Her POCT A1c today is 8.3%, increasing from last visit of 6.9%.     Recent labs reviewed.  -her diabetes is complicated by coronary artery disease, CHF, obesity/sedentary life and she remains at a high risk for more acute and chronic complications which include CAD, CVA, CKD, retinopathy, and neuropathy. These are all discussed in detail with her.  The following Lifestyle Medicine recommendations according to American College of Lifestyle Medicine [facility]) were discussed and offered to patient and she agrees to start the journey:  A. Whole Foods, Plant-based plate comprising of fruits and vegetables, plant-based proteins, whole-grain carbohydrates was discussed in detail with the patient.   A list for source of those nutrients were also provided to the patient.  Patient will use only water  or unsweetened tea for hydration. B.  The need to stay away from risky substances including alcohol, smoking; obtaining 7 to 9 hours of restorative sleep, at least 150 minutes of moderate intensity exercise weekly, the importance of healthy social connections,  and stress reduction techniques were discussed. C.  A full color page of  Calorie density of various food groups per pound showing examples of each food groups was provided to the patient.  - Nutritional counseling repeated/built upon at each appointment.  - The patient admits there is a room for improvement in their diet and drink choices. -  Suggestion is made for the patient to avoid simple carbohydrates from their diet including Cakes, Sweet Desserts / Pastries, Ice Cream, Soda (diet and regular), Sweet Tea, Candies, Chips, Cookies, Sweet Pastries, Store Bought Juices, Alcohol in Excess of 1-2 drinks a day,  Artificial Sweeteners, Coffee Creamer, and Sugar-free Products. This will help patient to have stable blood glucose profile and potentially avoid unintended weight gain.   - I encouraged the patient to switch to unprocessed or minimally processed complex starch and increased protein intake (animal or plant source), fruits, and vegetables.   - Patient is advised to stick to a routine mealtimes to eat 3 meals a day and avoid unnecessary snacks (to snack only to correct hypoglycemia).  - I have approached her with the following individualized plan to manage diabetes and patient agrees:   -Given her presentation with chronic glycemic burden, she will likely require multiple daily injections of insulin  in order for her to achieve control of diabetes to target.  -She is advised to continue Mounjaro  12.5 mg SQ weekly and Metformin  500 mg po once daily for now.   -She is encouraged to monitor glucose at least once-twice per week, before breakfast and to call the clinic if she has readings less than 70 or greater than 300 for 3 tests in a row.  She could not tolerate the Dexcom G7 (had allergic reaction to adhesive).  2) Blood Pressure /Hypertension: Her blood pressure is controlled to  target.  She is advised to continue her medications as prescribed by PCP.  3) Lipids/Hyperlipidemia:  Her most recent lipid panel from 06/20/23 shows controlled LDL at 82.  She could not tolerate the Lipitor due to elevated LFTs and could not tolerate the Repatha  either  4)  Weight/Diet:  Her Body mass index is 48.23 kg/m. - clearly complicating her diabetes care.  She is a candidate for modest weight loss.  She has multiple comorbid situations complicating her chance of bariatric surgery.  I discussed with her the fact that loss of 5 - 10% of her  current body weight will have the most impact on her diabetes management.  CDE Consult will be initiated . Exercise, and detailed carbohydrates information provided  -   detailed on discharge instructions.  5) Hypothyroidism:  There are no recent TFTs to review.  She is advised to continue Levothyroxine  75 mcg po daily before breakfast. Will recheck TFTs prior to next visit and adjust dose accordingly.   - We discussed about the correct intake of her thyroid  hormone, on empty stomach at fasting, with water , separated by at least 30 minutes from breakfast and other medications,  and separated by more than 4 hours from calcium , iron, multivitamins, acid reflux medications (PPIs). -Patient is made aware of the fact that thyroid  hormone replacement is needed for life, dose to be adjusted by periodic monitoring of thyroid  function tests.  6) Chronic Care/Health Maintenance: -she is not on ACE/ARB (intolerant to both) or statin medications (due to elevated LFTs) and is encouraged to initiate and continue to follow up with Ophthalmology, Dentist,  Podiatrist at least yearly or according to recommendations, and advised to  stay away from smoking. I have recommended yearly flu vaccine and pneumonia vaccine at least every 5 years; moderate intensity exercise for up to 150 minutes weekly; and  sleep for at least 7 hours a day.  - she is advised to maintain close follow up with Angela Rollene BRAVO, MD for primary care needs, as well as her other providers for optimal and coordinated care.     I spent  46  minutes in the care of the patient today including review of labs from CMP, Lipids, Thyroid  Function, Hematology (current and previous including abstractions from other facilities); face-to-face time discussing  her blood glucose readings/logs, discussing hypoglycemia and hyperglycemia episodes and symptoms, medications doses, her options of short and long term treatment based on the latest standards of care / guidelines;  discussion about incorporating lifestyle medicine;  and documenting the encounter. Risk reduction counseling performed per USPSTF guidelines to reduce  obesity and cardiovascular risk factors.     Please refer to Patient Instructions for Blood Glucose Monitoring and Insulin /Medications Dosing Guide  in media tab for additional information. Please  also refer to  Patient Self Inventory in the Media  tab for reviewed elements of pertinent patient history.  Pleasant Abundis participated in the discussions, expressed understanding, and voiced agreement with the above plans.  All questions were answered to her satisfaction. she is encouraged to contact clinic should she have any questions or concerns prior to her return visit.   Follow up plan: - Return in about 4 months (around 09/10/2024) for Diabetes F/U with A1c in office, Thyroid  follow up, Previsit labs.   Benton Rio, [facility] [address] [place], [place] [address] Phone: [phone] Fax: [phone]   05/13/2024, 10:46 AM    "

## 2024-05-21 ENCOUNTER — Ambulatory Visit: Admitting: Family Medicine

## 2024-05-22 ENCOUNTER — Other Ambulatory Visit: Payer: Self-pay | Admitting: Family Medicine

## 2024-05-23 ENCOUNTER — Other Ambulatory Visit: Payer: Self-pay | Admitting: Family Medicine

## 2024-05-28 ENCOUNTER — Other Ambulatory Visit: Payer: Self-pay | Admitting: Family Medicine

## 2024-06-20 ENCOUNTER — Encounter: Admitting: Family Medicine

## 2024-07-24 ENCOUNTER — Ambulatory Visit: Admitting: Primary Care

## 2024-09-10 ENCOUNTER — Ambulatory Visit: Admitting: Nurse Practitioner

## 2024-11-24 ENCOUNTER — Ambulatory Visit
